# Patient Record
Sex: Male | Born: 1945
Health system: Southern US, Community
[De-identification: ages and names within clinical notes are randomized; demographics above are authoritative.]

## PROBLEM LIST (undated history)

## (undated) DIAGNOSIS — H9191 Unspecified hearing loss, right ear: Secondary | ICD-10-CM

## (undated) DIAGNOSIS — I491 Atrial premature depolarization: Secondary | ICD-10-CM

## (undated) DIAGNOSIS — M1990 Unspecified osteoarthritis, unspecified site: Secondary | ICD-10-CM

## (undated) DIAGNOSIS — G629 Polyneuropathy, unspecified: Secondary | ICD-10-CM

## (undated) DIAGNOSIS — Z7901 Long term (current) use of anticoagulants: Secondary | ICD-10-CM

## (undated) DIAGNOSIS — J45909 Unspecified asthma, uncomplicated: Secondary | ICD-10-CM

## (undated) DIAGNOSIS — N189 Chronic kidney disease, unspecified: Secondary | ICD-10-CM

## (undated) DIAGNOSIS — K279 Peptic ulcer, site unspecified, unspecified as acute or chronic, without hemorrhage or perforation: Secondary | ICD-10-CM

## (undated) DIAGNOSIS — I2510 Atherosclerotic heart disease of native coronary artery without angina pectoris: Secondary | ICD-10-CM

## (undated) DIAGNOSIS — G609 Hereditary and idiopathic neuropathy, unspecified: Secondary | ICD-10-CM

## (undated) DIAGNOSIS — H9190 Unspecified hearing loss, unspecified ear: Secondary | ICD-10-CM

## (undated) DIAGNOSIS — I350 Nonrheumatic aortic (valve) stenosis: Secondary | ICD-10-CM

## (undated) DIAGNOSIS — C4491 Basal cell carcinoma of skin, unspecified: Secondary | ICD-10-CM

## (undated) DIAGNOSIS — J189 Pneumonia, unspecified organism: Secondary | ICD-10-CM

## (undated) DIAGNOSIS — Z974 Presence of external hearing-aid: Secondary | ICD-10-CM

## (undated) DIAGNOSIS — N182 Chronic kidney disease, stage 2 (mild): Secondary | ICD-10-CM

## (undated) DIAGNOSIS — Z85828 Personal history of other malignant neoplasm of skin: Secondary | ICD-10-CM

## (undated) DIAGNOSIS — R600 Localized edema: Secondary | ICD-10-CM

## (undated) DIAGNOSIS — Z860101 Personal history of adenomatous and serrated colon polyps: Secondary | ICD-10-CM

## (undated) DIAGNOSIS — K219 Gastro-esophageal reflux disease without esophagitis: Secondary | ICD-10-CM

## (undated) DIAGNOSIS — K449 Diaphragmatic hernia without obstruction or gangrene: Secondary | ICD-10-CM

## (undated) DIAGNOSIS — G8929 Other chronic pain: Secondary | ICD-10-CM

## (undated) DIAGNOSIS — G250 Essential tremor: Secondary | ICD-10-CM

## (undated) DIAGNOSIS — M545 Low back pain: Secondary | ICD-10-CM

## (undated) DIAGNOSIS — C61 Malignant neoplasm of prostate: Secondary | ICD-10-CM

## (undated) DIAGNOSIS — Z87442 Personal history of urinary calculi: Secondary | ICD-10-CM

## (undated) DIAGNOSIS — M8580 Other specified disorders of bone density and structure, unspecified site: Secondary | ICD-10-CM

## (undated) DIAGNOSIS — I10 Essential (primary) hypertension: Secondary | ICD-10-CM

## (undated) DIAGNOSIS — M1711 Unilateral primary osteoarthritis, right knee: Secondary | ICD-10-CM

## (undated) DIAGNOSIS — H269 Unspecified cataract: Secondary | ICD-10-CM

## (undated) DIAGNOSIS — N200 Calculus of kidney: Secondary | ICD-10-CM

## (undated) DIAGNOSIS — N289 Disorder of kidney and ureter, unspecified: Secondary | ICD-10-CM

## (undated) DIAGNOSIS — R0600 Dyspnea, unspecified: Secondary | ICD-10-CM

## (undated) DIAGNOSIS — D631 Anemia in chronic kidney disease: Secondary | ICD-10-CM

## (undated) DIAGNOSIS — E785 Hyperlipidemia, unspecified: Secondary | ICD-10-CM

## (undated) DIAGNOSIS — Z77098 Contact with and (suspected) exposure to other hazardous, chiefly nonmedicinal, chemicals: Secondary | ICD-10-CM

## (undated) DIAGNOSIS — N529 Male erectile dysfunction, unspecified: Secondary | ICD-10-CM

## (undated) DIAGNOSIS — E7849 Other hyperlipidemia: Secondary | ICD-10-CM

## (undated) DIAGNOSIS — I959 Hypotension, unspecified: Secondary | ICD-10-CM

## (undated) DIAGNOSIS — Z86010 Personal history of colonic polyps: Secondary | ICD-10-CM

## (undated) DIAGNOSIS — M259 Joint disorder, unspecified: Secondary | ICD-10-CM

## (undated) DIAGNOSIS — M549 Dorsalgia, unspecified: Secondary | ICD-10-CM

## (undated) DIAGNOSIS — M79606 Pain in leg, unspecified: Secondary | ICD-10-CM

## (undated) DIAGNOSIS — Z8711 Personal history of peptic ulcer disease: Secondary | ICD-10-CM

## (undated) DIAGNOSIS — R269 Unspecified abnormalities of gait and mobility: Secondary | ICD-10-CM

## (undated) DIAGNOSIS — R32 Unspecified urinary incontinence: Secondary | ICD-10-CM

## (undated) DIAGNOSIS — M459 Ankylosing spondylitis of unspecified sites in spine: Secondary | ICD-10-CM

## (undated) DIAGNOSIS — Z9181 History of falling: Secondary | ICD-10-CM

## (undated) DIAGNOSIS — M810 Age-related osteoporosis without current pathological fracture: Secondary | ICD-10-CM

## (undated) DIAGNOSIS — Z9889 Other specified postprocedural states: Secondary | ICD-10-CM

## (undated) DIAGNOSIS — R319 Hematuria, unspecified: Secondary | ICD-10-CM

## (undated) DIAGNOSIS — C419 Malignant neoplasm of bone and articular cartilage, unspecified: Secondary | ICD-10-CM

## (undated) DIAGNOSIS — N183 Chronic kidney disease, stage 3 unspecified: Secondary | ICD-10-CM

## (undated) DIAGNOSIS — E041 Nontoxic single thyroid nodule: Secondary | ICD-10-CM

## (undated) HISTORY — DX: Ankylosing spondylitis of unspecified sites in spine: M45.9

## (undated) HISTORY — PX: BACK SURGERY: SHX140

## (undated) HISTORY — DX: Malignant neoplasm of prostate: C61

## (undated) HISTORY — DX: Personal history of urinary calculi: Z87.442

## (undated) HISTORY — DX: Basal cell carcinoma of skin, unspecified: C44.91

## (undated) HISTORY — DX: Other hyperlipidemia: E78.49

## (undated) HISTORY — DX: Nonrheumatic aortic (valve) stenosis: I35.0

## (undated) HISTORY — DX: Unilateral primary osteoarthritis, right knee: M17.11

## (undated) HISTORY — DX: Unspecified asthma, uncomplicated: J45.909

## (undated) HISTORY — DX: Nontoxic single thyroid nodule: E04.1

## (undated) HISTORY — DX: Atherosclerotic heart disease of native coronary artery without angina pectoris: I25.10

## (undated) HISTORY — PX: SPINE SURGERY: SHX786

## (undated) HISTORY — DX: Unspecified hearing loss, unspecified ear: H91.90

## (undated) HISTORY — DX: Chronic kidney disease, stage 3 unspecified: N18.30

## (undated) HISTORY — DX: Personal history of peptic ulcer disease: Z87.11

## (undated) HISTORY — DX: Essential (primary) hypertension: I10

## (undated) HISTORY — DX: Hypotension, unspecified: I95.9

## (undated) HISTORY — DX: Malignant neoplasm of bone and articular cartilage, unspecified: C41.9

## (undated) HISTORY — PX: OTHER SURGICAL HISTORY: SHX169

## (undated) HISTORY — DX: Peptic ulcer, site unspecified, unspecified as acute or chronic, without hemorrhage or perforation: K27.9

## (undated) HISTORY — DX: Joint disorder, unspecified: M25.9

## (undated) HISTORY — DX: Polyneuropathy, unspecified: G62.9

## (undated) HISTORY — DX: Essential tremor: G25.0

## (undated) HISTORY — PX: EYE SURGERY: SHX253

## (undated) HISTORY — DX: Other specified disorders of bone density and structure, unspecified site: M85.80

## (undated) HISTORY — DX: Low back pain: M54.5

## (undated) HISTORY — DX: Chronic kidney disease, stage 2 (mild): N18.2

## (undated) HISTORY — PX: VASECTOMY: SHX75

## (undated) HISTORY — DX: Unspecified cataract: H26.9

## (undated) NOTE — Unmapped External Note (Signed)
 Formatting of this note is different from the original. Patient: Bruce Ball  Procedure Summary     Date: 07/01/24 Room / Location: OR 4K6 / [facility] OR   Anesthesia Start: 1248 Anesthesia Stop: 1422   Procedure: ORIF, FRACTURE, FEMUR, DISTAL (Right: Femur) Diagnosis: (R periprosthetic distal femur fx)   Surgeons: Large, Debby Cough, MD Responsible Provider: Debby Raring, MD   Anesthesia Type: general, regional, MAC ASA Status: 3     Anesthesia Type: general, regional, MAC Last vitals BP (!) 158/76 (07/01/24 1615)   Temp 36.6 C (97.9 F) (07/01/24 1600)   Pulse 85 (07/01/24 1615)  Resp 20 (07/01/24 1615)   SpO2 95 % (07/01/24 1615)    SIGN OUT NOTE:  Patient Evaluated:PACU Level of Consciousness:awake, alert and confused (Baseline confused per chart review and nursing, Aox1.)    Pain Management: adequate    PONV Outcome:  No Postop Assessment: no apparent anesthetic complications, tolerate procedure well and no evidence of recall   Cardiovascular:  Hemodynamically stable and blood pressure returned to baseline Respiratory:Respiratory:spontaneous ventilation and nasal cannula      Condition on Discharge/Transfer: stable Discharge/Transfer Disposition: med/surg floor     Patient able to participate in sign-out: Does NOT require additional follow up  There were no known notable events for this encounter.   Electronically signed by Luke Rosina Pintos, MD at 07/01/2024  4:25 PM EDT

## (undated) NOTE — Progress Notes (Signed)
 Formatting of this note might be different from the original.  Care Management Progress Note  1. Recommendation: SAR  2. Interventions: MSW faxed updated clinicals to [facility], [facility], and [facility] for SAR placement. MSW called [facility] [phone] to speak with admissions. No answer, MSW left a VM.   MSW called [facility] [phone] with [facility] and no answer, MSW left a VM.   MSW called Niki [phone] with [facility] and Niki stated she would review the clinicals and follow up.  MSW met with pt and pt's wife at bedside. Pt's wife called her insurance about non-emergency transport for the pt once he has an accepting facility. If the chosen facility is in network no prior authorization will be needed.   If the facility is not in network then prior authorization will be needed and [employer] will need the following:  - NPI  - Name of Ambulance Service  - MD Name, address, and number - Date of transfer service  - Pt clinicals   These items will need to be faxed to [phone]  3. Barriers: Other: Pending an accepting facility   4. Outcomes: Other: ongoing dc planning   5. Next Steps: Follow up with [facility] about auth initiation and reach out to transportation regarding out-of-state transport.  6. Expected Discharge Date: 7/14  Update 12:38pm - MSW received a call from [facility] and she stated that they can accept the pt and that she is leaving for the day but will start insurance auth in the morning.  MSW called [facility] [phone], Option 5 to inquire about out-of-state non-emergency transport. [employer] stated they will need information for outsourced transportation company and will initiate authorization that can take up to 14 days.   MSW called pt's wife to provide an update about SAR placement and to inform her that it is likely she will need to pay upfront for transport. Pt's wife verbalized understanding and asked MSW to follow up with a quote for  transport.   Luise Creed, MSW  [phone] Electronically signed by Creed Luise, MSW at 07/07/2024 11:39 AM EDT Electronically signed by Creed Luise, MSW at 07/07/2024 12:46 PM EDT Electronically signed by Creed Luise, MSW at 07/08/2024  7:43 AM EDT Electronically signed by Creed Luise, MSW at 07/08/2024 11:33 AM EDT

## (undated) NOTE — Nursing Note (Signed)
 Formatting of this note might be different from the original. Resumed care of pt from night RN Bonney). Pt was A&O x 4 in position of comfort, & no distress was indicated. Bed locked in lowest position & call button within reach. Bed alarm on zone 2 & SCDs on L leg. Brace on R leg, so unable to put SCD on that on.  Electronically signed by Prentiss Krabbe, RN at 07/02/2024 12:40 PM EDT

## (undated) NOTE — Progress Notes (Signed)
 Formatting of this note is different from the original. Brief Ortho Progress Note  - Met with patient and spouse to update on no OR today and plan for OR tentatively 7/3.    - Diet & NPO @ [place] ordered  Hosey Arrow, APRN, AGACNP-BC  [facility]    PRIMARY Ortho Trauma or Joints - PIC [phone]   ALL NEW CONSULTS - PIC [phone]  New Orthopaedic consult patients or newly found injuries  CURRENT/EXISTING PATIENTS Ortho TRAUMA CONSULTS - PIC [phone]  Ortho SPINE - PIC [phone]  Ortho HAND - PIC [id number]  Podiatry - PIC [phone] Electronically signed by Arrow Hosey Borer, NP at 06/30/2024  3:56 PM EDT

## (undated) NOTE — Unmapped External Note (Signed)
 Formatting of this note might be different from the original.  Problem: Increased nutrient needs (NI-5.1) Goal: Food and/or Nutrient Delivery Description: Individualized approach for food/nutrient provision. Outcome: Progressing Toward Goal  Electronically signed by Gennie Lam, DIETETIC TECH at 07/07/2024 11:52 AM EDT

## (undated) NOTE — Nursing Note (Signed)
 Formatting of this note might be different from the original. Patient is A&Ox4. Patient spouse present at bedside at this time. Patient appeared to be in no signs of distress. Patient reports a 9/10 pain. AIDET performed. Board updated. Patient bed is in the lowest position with bed alarm turned on. SCD are in use. Plan of care on-going Electronically signed by [name] Rife, RN at 07/03/2024  8:04 PM EDT

## (undated) NOTE — Progress Notes (Signed)
 Formatting of this note might be different from the original. Occupational Therapy Contact Note  Consult appreciated and chart reviewed. Per chart, patient pending OR with ortho today for femur fracture. Will continue to monitor chart and re-attempt when the patient is available/appropriate.   Thank you, [place] Bennie, OTD, OTR/L Occupational Therapist  Electronically signed by Bennie, [place], OT at 07/01/2024  7:45 AM EDT

## (undated) NOTE — Nursing Note (Signed)
 Formatting of this note might be different from the original. Patient is A&Ox4. Patient appeared to be in no signs of distress. Patient has no complaints of pain. AIDET performed. Board updated. Patient bed is in the lowest position with bed alarm turned on. SCD are in use. Plan of care on-going Electronically signed by [name] Rife, RN at 07/09/2024  2:24 AM EDT

## (undated) NOTE — Progress Notes (Signed)
 Formatting of this note is different from the original. Orthopaedic Progress Note  Patient Info: Name: Bruce Ball  Admission Date: 06/27/2024  3:16 PM  MRN: [id number] Hospital Day: Day 39 of ? (NO DRG)   Room/Bed: 5B32/01 DOB: 02-09-1946   Age: 26 y.o.    Interval Update / Subjective  - No acute events overnight.  - No new numbness, tingling, weakness.  - Pain controlled  - Denies Nausea, Vomiting, Diarrhea, Chest pain, and SOB - Dispo pending SAR placement with external facility in [place]. Wife at [facility] and agrees on dispo recommendation.  Objective   Temp:  [36.6 C (97.8 F)-36.9 C (98.4 F)] 36.8 C (98.2 F) Heart Rate:  [65-81] 66 Respirations:  [17-18] 18 Blood Pressure: (127-153)/(62-75) 143/70  Labs: Recent Labs  Lab 07/05/24 2345 07/08/24 1100  NA 141 141  K 3.9 4.6  CL 105 105  CO2 27 28  BUN 29* 32*  CREATININE 1.1 1.2  GLU 99 125   Recent Labs  Lab 07/05/24 2345 07/08/24 1100  WBC 8.4 7.2  HGB 12.5* 12.8*  PLT 215 290   No results for input(s): INR in the last 168 hours.  I/O I/O last 3 completed shifts: In: 296 [P.O.:296] Out: 1550 [Urine:1550] No intake/output data recorded.  Gen: NAD, AOx3 Pulm: Non-labored breathing Cards: Regular rate and rhythm   RLE: Dressings c/d/I, HKB @ 20 degrees SILT sp/dp/t/sa/su Motor +ehl/fhl/ta/gsc Compartments soft, compressible Toes wwp, BCR x5 2+ DP pulse  Assessment   Bruce Ball is a 53 y.o. male s/p GLF.  Injury/Diagnosis Complex  - R periprosthetic MFC fx  Surgeries - R femur ORIF on 07/01/24 with Dr. Jackquelyn Salines Problem List Active Hospital Problems   Diagnosis Date Noted   DVT (deep venous thrombosis) (CMS-HCC) 06/28/2024   HTN (hypertension), benign 06/28/2024   Acid reflux 06/28/2024   CHF (congestive heart failure) (CMS-HCC) 06/28/2024   Renal disease 06/28/2024   Closed displaced fracture of condyle of right femur (CMS-HCC) 06/27/2024   Trauma 06/27/2024   Plan  General   -IS - Monitor & replete lytes PRN - BR  Acute Pain - Multimodal analgesia  - Adjust PRN  RLE - Splints/Brace/Ex-fix: HKB locked at 20  - Drains/wound vacs: None - Surgery(s) complete - Follow-up: 7/22 with TL or at home in [facility] - PT/OT  Current Activity Order (From admission, onward)         Ordered    Activity - Weight Bearing and Restrictions  UNTIL DISCONTINUED       Question Answer Comment  Activity Restrictions Weight Bearing   Activity Restrictions Brace Wear Precautions   Weight bearing status RUE Weight bearing as tolerated   Weight bearing status LUE Weight bearing as tolerated   Weight bearing status RLE Non-weight bearing   Weight bearing status LLE Weight bearing as tolerated   Bracing Wear RLE   Patient to wear RLE brace At all times (specify when to remove) HKB locked @ 20  When to remove RLE brace N/A     07/07/24 1045        PPX - Antibiotics: 24 Hr Periop Ancef  - IP: home Eliquis   - at Discharge: home Eliquis  dose (2.5mg )   Remainder of care per Co-Management Team W  This patient is on [facility] Orthopedic Surgery Internal Medicine Co-Management Team W.  Orthopedic Surgery can be reached at pic [phone] for any questions regarding pain control or the injured/operative extremity. [employer] W can be reached at [employer]  [phone] for any questions regarding vital sign abnormalities, blood sugar, urinary retention, constipation, or any other medical concerns. In the event of a patient emergency, please page [facility] Team W at pic [phone].  Discharge Planning - Medically cleared for DC - Barrier: SAR - DC Location: SAR in [place] - PT/OT: Clearance not needed   Margarie Bras, MD  [facility]    PRIMARY Ortho Trauma or Joints - PIC [phone]   ALL NEW CONSULTS - PIC [phone]  New Orthopaedic consult patients or newly found injuries  CURRENT/EXISTING PATIENTS   Ortho TRAUMA CONSULTS - PIC [phone]    Ortho SPINE - PIC [phone]    Ortho HAND - PIC [phone]     Podiatry - PIC [phone]    Cosigned by Jackquelyn Debby Cough, MD at 07/12/2024  9:11 AM EDT Electronically signed by Bras Margarie Bent, MD at 07/10/2024 11:33 AM EDT Electronically signed by Jackquelyn Debby Cough, MD at 07/12/2024  9:11 AM EDT  Associated attestation - Large, Debby Cough, MD - 07/12/2024  9:11 AM EDT Formatting of this note might be different from the original. I am administratively signing this document.  Debby Cough Jackquelyn, MD

## (undated) NOTE — Progress Notes (Signed)
 Formatting of this note is different from the original. Orthopaedic Progress Note  Patient Info: Name: Bruce Ball  Admission Date: 06/27/2024  3:16 PM  MRN: [id number] Hospital Day: Day 7 of ? (NO DRG)   Room/Bed: 5B32/01 DOB: 1946-06-02   Age: 60 y.o.    Interval Update / Subjective  POD3 s/p R femur ORIF on 07/01/24 w Dr. Jackquelyn  - No acute ortho events overnight.  - Resting comfortably in bed - Pain controlled - Denies Nausea, Vomiting, Diarrhea, Chest Pain, and SOB  -requires ongoing PT/OT care  Objective   Temp:  [36.4 C (97.5 F)-37.4 C (99.3 F)] 37.1 C (98.7 F) Heart Rate:  [67-78] 72 Respirations:  [15-18] 18 Blood Pressure: (127-171)/(54-74) 155/63  Labs: Recent Labs  Lab 07/02/24 0122 07/03/24 0627  NA 138 141  K 4.1 3.8  CL 103 105  CO2 25 28  BUN 22 26*  CREATININE 1.1 1.2  GLU 165* 102   Recent Labs  Lab 07/02/24 0122 07/03/24 0627  WBC 6.7 9.6  HGB 13.9 13.4  PLT 202 199   Recent Labs  Lab 06/27/24 1538 06/29/24 2321  INR 1.4* 1.1*   I/O I/O last 3 completed shifts: In: 1000 [P.O.:1000] Out: 870 [Urine:870] I/O this shift: In: -  Out: 690 [Urine:690]  Gen: NAD, AOx3 Pulm: Non-labored breathing Cards: Regular rate and rhythm   RLE: Dressings c/d/I, knee immobilizer in place SILT sp/dp/t/sa/su Motor +ehl/fhl/ta/gsc Compartments soft, compressible Toes wwp, BCR x5 2+ DP pulse  Assessment   Bruce Ball is a 52 y.o. male s/p GLF.  Injury/Diagnosis Complex  - R periprosthetic MFC fx  Surgeries - R femur ORIF on 07/01/24 with Dr. Jackquelyn Salines Problem List Active Hospital Problems   Diagnosis Date Noted   DVT (deep venous thrombosis) (CMS-HCC) 06/28/2024   HTN (hypertension), benign 06/28/2024   Acid reflux 06/28/2024   CHF (congestive heart failure) (CMS-HCC) 06/28/2024   Renal disease 06/28/2024   Closed displaced fracture of condyle of right femur (CMS-HCC) 06/27/2024   Trauma 06/27/2024   Plan  General   -IS - Monitor & replete lytes PRN - BR  Acute Pain - Multimodal analgesia  - Adjust PRN  RLE - Splints/Brace/Ex-fix: HKB locked at 20  - Drains/wound vacs: None - Surgery(s) complete - Follow-up: 2-3 weeks - PT/OT  Current Activity Order (From admission, onward)         Ordered    Activity - Weight Bearing and Restrictions  UNTIL DISCONTINUED       Question Answer Comment  Activity Restrictions Weight Bearing   Activity Restrictions Brace Wear Precautions   Weight bearing status RUE Weight bearing as tolerated   Weight bearing status LUE Weight bearing as tolerated   Weight bearing status RLE Non-weight bearing   Weight bearing status LLE Weight bearing as tolerated   Bracing Wear RLE   Patient to wear RLE brace At all times (specify when to remove)   When to remove RLE brace N/A     07/01/24 1411        PPX - Antibiotics: 24 Hr Periop Ancef  - IP: Lovenox  - at Discharge: Complete initial 4 weeks ASA 81mg  BID  Discharge Planning - Estimated DC Date: TBD - Barrier: PT/OT - DC Location: TBD - PT/OT: Pending Eval  Lesley Spurr, MD  [facility]    PRIMARY Ortho Trauma or Joints - PIC [id number]   ALL NEW CONSULTS - PIC [phone]  New Orthopaedic consult patients or newly found  injuries  CURRENT/EXISTING PATIENTS Ortho TRAUMA CONSULTS - PIC [phone]  Ortho SPINE - PIC [phone]  Ortho HAND - PIC [id number]  Podiatry - PIC [phone]  Electronically signed by Rebecka Lesley Leyden, MD at 07/04/2024  6:37 AM EDT

## (undated) NOTE — Unmapped External Note (Signed)
 Formatting of this note might be different from the original. Wound Care  Patient seen for skin tear follow up. Patient wound with scab, left intact d/t blood thinners. Photo in chart media. No skilled wound care needs at his time, wound care to sign off. Please re-consult if patient need for skilled services arises.    Alan Saba, BSN, RN, [facility] Wound Care Department   Electronically signed by Saba Alan, RN at 07/06/2024 12:41 PM EDT Electronically signed by Saba Alan, RN at 07/06/2024  2:36 PM EDT

## (undated) NOTE — Progress Notes (Signed)
 Formatting of this note might be different from the original. Patient received A&Ox4, in NAD. Saturating well on room air. AIDET. Boards updated. All safety measures in place. Wife bedside. Bed alarm on, SCD in place. Call light within reach. Plan of care ongoing.   Electronically signed by Troup, Anyssa, RN at 07/01/2024  8:28 AM EDT

## (undated) NOTE — Progress Notes (Signed)
 Formatting of this note might be different from the original.  Care Management Progress Note  1. Recommendation: SAR  2. Interventions: MSW sent updated PT/OT notes to Niki [phone] with [facility] to send to insurance for auth.   Britney confirmed notes were sent. Shara is anticipated to come back by Monday. Britney stated DC documents/orders will need to be in by 12 pm and the facility cutoff time is 8pm.   Transportation has been arranged for Tuesday (7/15) at 5 am. Transportation has been outsourced and the company is [employer] [phone]. Pt's wife has already paid for transport.   MSW called pt's insurance [employer] and spoke to [place] T. [phone], Option 5 to start the prior authorization for transportation billing. MSW provided [place] with the transportation details and NPI numbers. [facility] submitted the prior-auth request and stated that the clinicals and dc documents will need to be faxed to [phone].  3. Barriers: Auth pending  4. Outcomes: Transportation arranged  5. Next Steps: Follow up on auth and fax   6. Expected Discharge Date: 7/15  5:00 pm - Britney with [facility] called MSW and pt's auth came back. The auth expires Tuesday 7/15.    Luise Creed, MSW  [phone] Electronically signed by Creed Luise, MSW at 07/09/2024  1:17 PM EDT Electronically signed by Creed Luise, MSW at 07/09/2024  5:00 PM EDT

## (undated) NOTE — Nursing Note (Signed)
 Formatting of this note might be different from the original. IV removed and dressing applied. All patient belongings given to transportation at this time. Discharge paper and medication given transportation at this time. Electronically signed by [name] Rife, RN at 07/13/2024  5:34 AM EDT

## (undated) NOTE — Progress Notes (Signed)
 Formatting of this note is different from the original. Orthopaedic Progress Note  Patient Info: Name: Bruce Ball  Admission Date: 06/27/2024  3:16 PM  MRN: [id number] Hospital Day: Day 15 of ? (NO DRG)   Room/Bed: 5B32/01 DOB: February 20, 1946   Age: 35 y.o.    Interval Update / Subjective  - No acute events overnight.  - No new numbness, tingling, weakness.  - Pain controlled  - Denies Nausea, Vomiting, Diarrhea, Chest pain, and SOB - Dispo pending SAR - anticipate DC 7/15 to [facility] . DC summary placed in advance per request of facility   Objective   Temp:  [36.6 C (97.8 F)-37.1 C (98.8 F)] 36.6 C (97.8 F) Heart Rate:  [59-78] 66 Respirations:  [16-19] 17 Blood Pressure: (141-146)/(64-76) 146/67  Labs: Recent Labs  Lab 07/08/24 1100 07/12/24 0056  NA 141 139  K 4.6 4.0  CL 105 107  CO2 28 26  BUN 32* 35*  CREATININE 1.2 1.2  GLU 125 105   Recent Labs  Lab 07/08/24 1100 07/12/24 0056  WBC 7.2 8.5  HGB 12.8* 13.0*  PLT 290 357   No results for input(s): INR in the last 168 hours.  I/O I/O last 3 completed shifts: In: 600 [P.O.:600] Out: 700 [Urine:700] No intake/output data recorded.  Gen: NAD, AOx3 Pulm: Non-labored breathing Cards: Regular rate and rhythm   RLE: Dressings c/d/I, HKB @ 20 degrees SILT sp/dp/t/sa/su Motor +ehl/fhl/ta/gsc Compartments soft, compressible Toes wwp, BCR x5 2+ DP pulse  Assessment   Bruce Ball is a 73 y.o. male s/p GLF.  Injury/Diagnosis Complex  - R periprosthetic MFC fx  Surgeries - R femur ORIF on 07/01/24 with Dr. Jackquelyn Salines Problem List Active Hospital Problems   Diagnosis Date Noted   DVT (deep venous thrombosis) (CMS-HCC) 06/28/2024   HTN (hypertension), benign 06/28/2024   Acid reflux 06/28/2024   CHF (congestive heart failure) (CMS-HCC) 06/28/2024   Renal disease 06/28/2024   Closed displaced fracture of condyle of right femur (CMS-HCC) 06/27/2024   Trauma 06/27/2024   Plan   General  -IS - Monitor & replete lytes PRN - BR  Acute Pain - Multimodal analgesia  - Adjust PRN  RLE - Splints/Brace/Ex-fix: HKB locked at 20  - Drains/wound vacs: None - Surgery(s) complete - Follow-up: 7/22 with TL or at [facility] in [facility] - PT/OT  Current Activity Order (From admission, onward)         Ordered    Activity - Weight Bearing and Restrictions  UNTIL DISCONTINUED       Question Answer Comment  Activity Restrictions Weight Bearing   Activity Restrictions Brace Wear Precautions   Weight bearing status RUE Weight bearing as tolerated   Weight bearing status LUE Weight bearing as tolerated   Weight bearing status RLE Non-weight bearing   Weight bearing status LLE Weight bearing as tolerated   Bracing Wear RLE   Patient to wear RLE brace At all times (specify when to remove) HKB locked @ 20  When to remove RLE brace N/A     07/07/24 1045        PPX - Antibiotics: 24 Hr Periop Ancef  - IP: home Eliquis   - at Discharge: home Eliquis  dose (2.5mg )   Remainder of care per Co-Management Team W  This patient is on [facility] Orthopedic Surgery Internal Medicine Co-Management Team W.  Orthopedic Surgery can be reached at pic [phone] for any questions regarding pain control or the injured/operative extremity. [employer] W can be  reached at pic [phone] for any questions regarding vital sign abnormalities, blood sugar, urinary retention, constipation, or any other medical concerns. In the event of a patient emergency, please page [facility] Team W at pic [phone].  Discharge Planning - Medically cleared for DC - Barrier: SAR - DC Location: SAR in [place] - PT/OT: Clearance not needed   Zetta Sherle RIGGERS, MPH  [facility]    PRIMARY Ortho Trauma or Joints - PIC [phone]   ALL NEW CONSULTS - PIC [phone]  New Orthopaedic consult patients or newly found injuries  CURRENT/EXISTING PATIENTS   Ortho TRAUMA CONSULTS - PIC [phone]    Ortho SPINE - PIC [phone]    Ortho HAND - PIC  [id number]    Podiatry - PIC [phone]    Electronically signed by Sherle Zetta, PA-C at 07/12/2024 11:09 AM EDT

## (undated) NOTE — Progress Notes (Signed)
 Formatting of this note is different from the original. Orthopaedic Progress Note  Patient Info: Name: Bruce Ball  Admission Date: 06/27/2024  3:16 PM  MRN: [id number] Hospital Day: Day 82 of ? (NO DRG)   Room/Bed: 5B32/01 DOB: 04-14-1946   Age: 62 y.o.    Interval Update / Subjective  - No acute events overnight.  - No new numbness, tingling, weakness.  - Pain controlled  - Denies Nausea, Vomiting, Diarrhea, Chest pain, and SOB - Dispo pending SAR placement with external facility in [place]. Wife at [facility] and agrees on dispo recommendation.  Objective   Temp:  [36.6 C (97.9 F)-37.2 C (98.9 F)] 36.6 C (97.9 F) Heart Rate:  [68-80] 78 Respirations:  [18] 18 Blood Pressure: (116-139)/(53-76) 116/58  Labs: Recent Labs  Lab 07/03/24 0627 07/05/24 2345  NA 141 141  K 3.8 3.9  CL 105 105  CO2 28 27  BUN 26* 29*  CREATININE 1.2 1.1  GLU 102 99   Recent Labs  Lab 07/05/24 2345 07/08/24 1100  WBC 8.4 7.2  HGB 12.5* 12.8*  PLT 215 290   No results for input(s): INR in the last 168 hours.  I/O I/O last 3 completed shifts: In: 654 [P.O.:654] Out: 1300 [Urine:1300] No intake/output data recorded.  Gen: NAD, AOx3 Pulm: Non-labored breathing Cards: Regular rate and rhythm   RLE: Dressings c/d/I, HKB @ 20 degrees SILT sp/dp/t/sa/su Motor +ehl/fhl/ta/gsc Compartments soft, compressible Toes wwp, BCR x5 2+ DP pulse  Assessment   Bruce Ball is a 57 y.o. male s/p GLF.  Injury/Diagnosis Complex  - R periprosthetic MFC fx  Surgeries - R femur ORIF on 07/01/24 with Dr. Jackquelyn Salines Problem List Active Hospital Problems   Diagnosis Date Noted   DVT (deep venous thrombosis) (CMS-HCC) 06/28/2024   HTN (hypertension), benign 06/28/2024   Acid reflux 06/28/2024   CHF (congestive heart failure) (CMS-HCC) 06/28/2024   Renal disease 06/28/2024   Closed displaced fracture of condyle of right femur (CMS-HCC) 06/27/2024   Trauma 06/27/2024   Plan  General   -IS - Monitor & replete lytes PRN - BR  Acute Pain - Multimodal analgesia  - Adjust PRN  RLE - Splints/Brace/Ex-fix: HKB locked at 20  - Drains/wound vacs: None - Surgery(s) complete - Follow-up: 7/22 with TL or at home in [facility] - PT/OT  Current Activity Order (From admission, onward)         Ordered    Activity - Weight Bearing and Restrictions  UNTIL DISCONTINUED       Question Answer Comment  Activity Restrictions Weight Bearing   Activity Restrictions Brace Wear Precautions   Weight bearing status RUE Weight bearing as tolerated   Weight bearing status LUE Weight bearing as tolerated   Weight bearing status RLE Non-weight bearing   Weight bearing status LLE Weight bearing as tolerated   Bracing Wear RLE   Patient to wear RLE brace At all times (specify when to remove) HKB locked @ 20  When to remove RLE brace N/A     07/07/24 1045        PPX - Antibiotics: 24 Hr Periop Ancef  - IP: home Eliquis   - at Discharge: home Eliquis  dose (2.5mg )   Remainder of care per Co-Management Team W  This patient is on [facility] Internal Medicine Co-Management Team W.  Orthopedic Surgery can be reached at pic [phone] for any questions regarding pain control or the injured/operative extremity. [employer] [facility] can be reached at [employer]  [phone] for any questions regarding vital sign abnormalities, blood sugar, urinary retention, constipation, or any other medical concerns. In the event of a patient emergency, please page [facility] Team W at pic [phone].  Discharge Planning - Medically cleared for DC - Barrier: SAR - DC Location: SAR in [place] - PT/OT: Clearance not needed   Zetta Sherle RIGGERS, MPH  [facility]    PRIMARY Ortho Trauma or Joints - PIC [phone]   ALL NEW CONSULTS - PIC [phone]  New Orthopaedic consult patients or newly found injuries  CURRENT/EXISTING PATIENTS   Ortho TRAUMA CONSULTS - PIC [phone]    Ortho SPINE - PIC [phone]    Ortho HAND - PIC [id number]     Podiatry - PIC [phone]    Electronically signed by Sherle Zetta, PA-C at 07/08/2024 12:00 PM EDT

## (undated) NOTE — Unmapped External Note (Signed)
 Formatting of this note might be different from the original.  Problem: Anxiety Goal: The patient's care is consistent with the individualized perioperative plan of care. Outcome: New Goal Established Goal: The patient's right to privacy is maintained. Outcome: New Goal Established Goal: Patient and family demonstrates knowledge of expected response to operative or invasive procedures Outcome: New Goal Established  Problem: Acute pain Goal: The patient demonstrates knowledge of pain management. Outcome: New Goal Established  Problem: Risk for falls Goal: Patient is free from signs and symptoms of injuries Outcome: New Goal Established  Problem: Nausea Goal: The patient receives prescribed medications and solutions, safely administered during the post-op period Outcome: New Goal Established  Electronically signed by Artis Kinder, RN at 07/01/2024 11:29 AM EDT

## (undated) NOTE — Unmapped External Note (Signed)
 Formatting of this note is different from the original.  Problem: DVT (Risk For) Goal: Demonstrates adequate circulation status as evidenced by the following indicators: Peripheral pulses strong/Peripheral pulses symmetrical/ Peripheral edema not present Outcome: Continue with Current Goal  Problem: Knowledge Deficit Goal: Patient will verbalize and/or demonstrate understanding of teaching by discharge Outcome: Continue with Current Goal Goal: Patient will Participate in learning opportunities offered by staff Outcome: Continue with Current Goal  Problem: Infection/Isolation (Risk/Actual) Goal: Patient will be free of hospital acquired infection throughout hospitalization Outcome: Continue with Current Goal  Problem: Pain/Comfort Goal: Adequate pain control while hospitalized Outcome: Continue with Current Goal  Problem: Fall Risk Goal: Patient will remain as independent as possible Outcome: Continue with Current Goal Goal: Patient will have lower fall risk. Lower injury risk. Outcome: Continue with Current Goal Goal: Patient will remain safe from falls and injury Outcome: Continue with Current Goal Goal: Patient/family will understand fall prevention measures Outcome: Continue with Current Goal Goal: Patient/family will understand injury reduction measures Outcome: Continue with Current Goal Goal: Patient/family will comply with fall program Outcome: Continue with Current Goal Goal: Patient/family will verbalize fall prevention strategies to implement after discharge Outcome: Continue with Current Goal  Problem: Skin Alteration (Risk for/Actual) Goal: Patient?s skin integrity will remain intact Outcome: Continue with Current Goal Goal: Patient will evidence no skin breakdown Outcome: Continue with Current Goal Goal: Patient will regain integrity of skin tissue Outcome: Continue with Current Goal  Problem: Anxiety Goal: The patient's care is consistent with the  individualized perioperative plan of care. Outcome: Continue with Current Goal Goal: The patient's right to privacy is maintained. Outcome: Continue with Current Goal Goal: Patient and family demonstrates knowledge of expected response to operative or invasive procedures Outcome: Continue with Current Goal  Problem: Anxiety Goal: The patient's care is consistent with the individualized perioperative plan of care. Outcome: Continue with Current Goal Goal: The patient's right to privacy is maintained. Outcome: Continue with Current Goal Goal: Patient and family demonstrates knowledge of expected response to operative or invasive procedures Outcome: Continue with Current Goal  Problem: Acute pain Goal: The patient demonstrates knowledge of pain management. Outcome: Continue with Current Goal  Problem: Risk for falls Goal: Patient is free from signs and symptoms of injuries Outcome: Continue with Current Goal  Problem: Nausea Goal: The patient receives prescribed medications and solutions, safely administered during the post-op period Outcome: Continue with Current Goal  Problem: Post-Operative Respiratory Complications Goal: Patient Will Remain Free Of Post-Operative Complications Outcome: Continue with Current Goal Goal: Effective Utilization Of Incentive Spirometry Description: INTERVENTIONS: 1. 10 times every hour while awake   2. Document volume every 4 hours Outcome: Continue with Current Goal Goal: Effective Pulmonary Toileting Description: INTERVENTIONS: 1. Cough/deep breath every 2 hours while awake Outcome: Continue with Current Goal Goal: Appropriate Oral Care Maintenance Description: INTERVENTIONS:  1. Oral care every 12 hours Outcome: Continue with Current Goal Goal: Adequate Patient/Family Education Description: INTERVENTIONS: 1. Patient/family education provided  Outcome: Continue with Current Goal Goal: Implementation Of Appropriate Early Mobility  Interventions Description: INTERVENTIONS: 1.  (Acute Care/Med-Surg) Get-Up-And-Go assessment completed, and interventions initiated 2.  (Critical/Intermediate Care) M.O.V.E.S. assessment completed, and interventions initiated Outcome: Continue with Current Goal Goal: Maintenance of HOB Elevation Description: INTERVENTIONS: 1. HOB 30 degrees Outcome: Continue with Current Goal  Problem: Anxiety Goal: The patient's care is consistent with the individualized perioperative plan of care. Outcome: Continue with Current Goal Goal: The patient's right to privacy is maintained. Outcome: Continue with Current Goal  Goal: Patient and family demonstrates knowledge of expected response to operative or invasive procedures Outcome: Continue with Current Goal  Problem: Acute pain Goal: The patient demonstrates knowledge of pain management. Outcome: Continue with Current Goal  Problem: Risk for falls Goal: Patient is free from signs and symptoms of injuries Outcome: Continue with Current Goal  Problem: Nausea Goal: The patient receives prescribed medications and solutions, safely administered during the post-op period Outcome: Continue with Current Goal  Problem: Decreased Gait Goal: Ambulate with least restrictive assistive device Outcome: Continue with Current Goal Goal: Improve balance Outcome: Continue with Current Goal  Electronically signed by [name] Rife, RN at 07/03/2024  8:03 PM EDT

## (undated) NOTE — Nursing Note (Signed)
 Formatting of this note might be different from the original. Patient received awake alert and oriented. No signs of distress noted. Immobilizer intact to the RLE. Skin tear and bruising to the R hand, R forehead above the R eye. Pictures in the media. Spouse at bedside. Safety measures in place. Call light within reach. Will continue to monitor. Electronically signed by Geraline Senters, RN at 06/28/2024 12:49 AM EDT

## (undated) NOTE — Unmapped External Note (Signed)
 Formatting of this note might be different from the original.  Problem: DVT (Risk For) Goal: Demonstrates adequate circulation status as evidenced by the following indicators: Peripheral pulses strong/Peripheral pulses symmetrical/ Peripheral edema not present Outcome: Continue with Current Goal  Problem: Knowledge Deficit Goal: Patient will Participate in learning opportunities offered by staff Outcome: Continue with Current Goal  Problem: Pain/Comfort Goal: Adequate pain control while hospitalized Outcome: Continue with Current Goal  Problem: Fall Risk Goal: Patient will have lower fall risk. Lower injury risk. Outcome: Continue with Current Goal  Electronically signed by Levan Rondo, RN at 07/10/2024  8:49 AM EDT

## (undated) NOTE — Nursing Note (Signed)
 Formatting of this note might be different from the original. Patient is A&Ox4. Patient appeared to be in no signs of distress. Patient has no complaints of pain. AIDET performed. Board updated. Patient bed is in the lowest position with bed alarm turned on. SCD are in use. Plan of care on-going Electronically signed by [name] Rife, RN at 07/07/2024  7:48 PM EDT

## (undated) NOTE — Unmapped External Note (Signed)
 Formatting of this note is different from the original.                                      [facility] Survey Template/Daily Progress Note    The patient is 75 y.o. male who was admitted on 06/27/2024 after a Fall.  Acute Care Surgery Treatment Team Assigned: [facility] ACS Trauma 1  Hospital course/Interventions: Bruce Ball is a 89M with PMHx PE (12/2019), CKD3a, Hx of prostrate cancer (01/ 2003, Gleason 3+3, 03-10-2002 s/p radical prostatectomy pelvic lymph node disections, no recurrence), lumbosacral radiculopathy, CAD (CAC on CT), PVCs on Coreg , HTN, GERD, vitamin D  deficiency, essential tremor, Hx Sarcoma (~1970, s/p revision right proximal femur replacement in setting of distal periprosthetic femur fx both components 09/2021), osteoporosis (Tx IV reclast yearly), HLD with Frailty score of 0.17 [place] right periprosthetic distal femoral condyle fracture.  In room patient had sustained episode of right knee pain, while knee was in immobilizer. Per patient and his wife, he has chronic decreased sensation in his feet numbness. No current chest pain, dyspnea, dizziness, lightheadedness, presyncope, or vision impairment. He is hard of hearing normally.  PMH: PE (12/2019), CKD3a, Hx of prostrate cancer (01/ 2003, Gleason 3+3, 03-10-2002 s/p radical prostatectomy pelvic lymph node disections, no recurrence), lumbosacral radiculopathy, CAD (CAC on CT), PVCs on Coreg , HTN, GERD, vitamin D  deficiency, essential tremor, Hx Sarcoma (~1970, s/p revision right proximal femur replacement in setting of distal periprosthetic femur fx both components 09/2021), osteoporosis (Tx IV reclast yearly), HLD  PSH: As above  Social Hx: Social History   Socioeconomic History   Marital status: Married    Spouse name: None   Number of children: None   Years of education: None   Highest education level: None  Occupational History   None  Tobacco Use   Smoking status: None   Smokeless  tobacco: None  Substance and Sexual Activity   Alcohol use: None   Drug use: None   Sexual activity: None  Other Topics Concern   None  Social History Narrative   None   Social Drivers of Health   Financial Resource Strain: Patient Unable To Answer (06/28/2024)   Overall Financial Resource Strain (CARDIA)    Difficulty of Paying Living Expenses: Patient unable to answer  Food Insecurity: Patient Unable To Answer (06/28/2024)   Hunger Vital Sign    Worried About Programme researcher, broadcasting/film/video in the Last Year: Patient unable to answer    Ran Out of Food in the Last Year: Patient unable to answer  Transportation Needs: Patient Unable To Answer (06/28/2024)   PRAPARE - Transportation    Lack of Transportation (Medical): Patient unable to answer    Lack of Transportation (Non-Medical): Patient unable to answer  Housing Stability: Unknown (06/28/2024)   Housing Stability Vital Sign    Unable to Pay for Housing in the Last Year: Patient unable to answer    Number of Times Moved in the Last Year: 0    Homeless in the Last Year: Patient unable to answer   Frailty Risk   Low: 0.0  Moderate: 0.33 High: greater or equal to 0.5  None = 0 None = 0 Hypertension requiring medication = 1  None = 0  Independent = 0  Less than 3 = 1  1/6=0.16  NTDS Comorbid Conditions:Hypertension (requiring medical therapy)  Family Hx: No family  history on file.  Home Meds: Medications Prior to Admission  Medication Sig Dispense Refill Last Dose/Taking   AmLODIPine  (NORVASC ) 10 mg tablet Take 10 mg by mouth every day.   Unknown   apixaban  (ELIQUIS ) 2.5 MG tablet Take 2.5 mg by mouth 2 times every day.   Unknown   atorvastatin  (LIPITOR) 20 MG tablet Take 20 mg by mouth every night. FOR CHOLESTEROL   Unknown   carvedilol  (COREG ) 25 mg tablet Take 25 mg by mouth every 12 hours.   Unknown   cetirizine (ZYRTEC) 10 mg tablet Take 10 mg by mouth use as needed for Allergies or Rhinitis.   Unknown   Cholecalciferol   (VITAMIN D3) 50 MCG (2000 UT) CAPS Take 2,000 Units by mouth every day.   Unknown   furosemide  (LASIX ) 20 mg tablet Take 20 mg by mouth use as needed (Take 1 tablet (20 mg total) by mouth daily as needed for fluid (if the feet and/or legs swell or weight increased 3 lbs in a day or 5 lbs in 3 days).).   Unknown   HYDROmorphone  (DILAUDID ) 4 mg tablet Take 2 mg by mouth every 12 hours as needed for Pain.   Unknown   methocarbamol  (ROBAXIN ) 500 mg tablet Take 500 mg by mouth 4 times every day as needed (muscle spasms).   Unknown   pantoprazole  DR (PROTONIX ) 40 mg tablet Take 40 mg by mouth every day.   Unknown   ferrous sulfate (IRON 325) 325 (65 Fe) MG tablet Take 325 mg by mouth every day with breakfast.   Unknown   fluticasone  (FLONASE ) 50 MCG/ACT nasal spray 2 sprays by Nasal route use as needed for Rhinitis or Allergies.   Unknown   DULoxetine  DR (CYMBALTA ) 30 mg DR capsule Take 30 mg by mouth every day.   Unknown   Vital signs:  Temp:  [36.6 C (97.9 F)-37.1 C (98.8 F)] 37 C (98.6 F) (06/30 1526) Heart Rate:  [60-88] 61 (06/30 1526) Respirations:  [12-19] 18 (06/30 1526) Blood Pressure: (139-194)/(75-102) 147/78 (06/30 1526)  Review of systems:  Pain:Poorly controlled Nausea/Vomiting:No Bowel Movement :No Respiratory distress:No  Physical exam:  Gen: NAD, resting in bed HEENT: PERRLA, EOMI, trachea midline CV: regular rate, HDS, good peripheral perfusion Pulmonary: regular respiratory rate, EWOB, symmetric chest rise Abd: soft, nontender, nondistended  Extremities RUE: No TTP SILT r/m/u/ax Motor +r/m/u/ain/pin/ax Compartments soft, compressible Radial pulse 2+ Fingers wwp, BCR x5  LUE SILT r/m/u/ax Motor +r/m/u/ain/pin/ax Compartments soft, compressible Radial pulse 2+ Fingers wwp, BCR x5  Pelvis: stable to rocking  RLE: Knee immobilizer TTP about ankle SILT sp/dp/t/sa/su Motor +ehl/fhl/ta/gsc Compartments soft, compressible Toes wwp, BCR 2+ DP  pulse  LLE: No TTP SILT sp/dp/t/sa/su Motor +ehl/fhl/ta/gsc Compartments soft, compressible Toes wwp, BCR x5 2+ DP pulse  Psych: affect appropriate and congruent with mood  Lab: Electrolytes:No results found for: NA, K, CL, CO2 ABG:No results found for: PHART, PO2ART, PCO2ART, BEART  Hemo:No results found for: WBC, HGB, HCT, PLT Coag: AST (SGOT)  Date Value Ref Range Status  06/27/2024 23 10 - 42 U/L Final   ALT (SGPT)  Date Value Ref Range Status  06/27/2024 17 17 - 63 U/L Final   aPTT  Date Value Ref Range Status  06/27/2024 26.7 25.1 - 36.5 sec Final   Alkaline Phosphatase  Date Value Ref Range Status  06/27/2024 63 38 - 126 U/L Final   Albumin,Bcg-Serum  Date Value Ref Range Status  06/27/2024 3.7 3.5 - 5.0 gm/dL Final  POCT Glucose :  Poct Glucose(Meter)  Date Value Ref Range Status  06/28/2024 105 70 - 150 mg/dL Final  93/69/7974 98 70 - 150 mg/dL Final  93/69/7974 90 70 - 150 mg/dL Final   Documentation of radiology results: Radiology Results (last 1 day)     Procedure Component Value Units Date/Time   CT Knee Right wo Contrast [[id number]] Collected: 06/27/24 1721   Order Status: Completed Updated: 06/28/24 1404   Narrative:     EXAM: CT KNEE RIGHT WO CONTRAST  TECHNIQUE: CT extremity images of the right knee without contrast were obtained. Sagittal and coronal reformatted images were reviewed.   CLINICAL INDICATION:  trauma. Ground level fall with periprosthetic fracture.   COMPARISON: Same day radiographs.  FINDINGS:    BONES AND JOINTS: Partially visualized right femur endoprosthesis without identified hardware complication. Acute comminuted and mildly displaced periprosthetic fracture involving the medial condyle with a vertical component adjacent to the hardware bone  at junction. The fracture lucency extends to the trochlea and intercondylar notch.  Questionable tiny nondisplaced fracture of the tibial spine  (sagittal reconstructed images series 601 image 80).   Joint spaces appear aligned. Moderate lipohemarthrosis.SABRA    SOFT TISSUES: Mild soft tissue swelling of the knee. Vascular calcifications.    Impression:     1.    Acute comminuted and mildly displaced periprosthetic fracture of the right medial condyle with extension to the trochlear and intercondylar notch. Moderate lipohemarthrosis. 2.    Questionable nondisplaced fracture of the lateral tibial spine. 3.    Prior postsurgical changes of partially visualized endoprosthesis of the femur without hardware complication.  Preliminary findings were published to the electronic medical record by Reyes Caprio, MD.  COMMUNICATION:  None.  Prelim reconciliation: RPR3 - Agree  The images were reviewed and interpreted by Birdia Ames.   XR ANKLE RIGHT AP AND LATERAL [[id number]] Collected: 06/27/24 1735   Order Status: Completed Updated: 06/27/24 2206   Narrative:     PROCEDURE:  XR ANKLE RIGHT AP AND LATERAL, XR TIBIA FIBULA RIGHT AP AND LATERAL, XR KNEE RIGHT AP AND LATERAL, XR FOOT RIGHT AP AND LATERAL, XR FEMUR MIN 2 VIEWS RT  REASON FOR STUDY:  Pain/Deformity Following Trauma   COMPARISON: None.  FINDINGS:   Right femur: Minimally displaced periprosthetic fracture along the superior medial femoral condyle. Joint space extension is not excluded. Status post long stem right hip replacement. No joint dislocation. Lipohemarthrosis. No focal soft tissue defect.  Right knee: No acute fracture or dislocation. Joints are in normal alignment.  No focal soft tissue defect.  Right tibia/fibula: No acute fracture or dislocation. Joints are in normal alignment.  No focal soft tissue defect.  Right ankle: No acute fracture or dislocation. Joints are in normal alignment.  No focal soft tissue defect.  Right foot: No acute fracture or dislocation. Joints are in normal alignment.  No focal soft tissue defect.    Impression:      Minimally displaced periprosthetic fracture along the superior medial femoral condyle. Joint space extension is not excluded.  The images were reviewed and interpreted by Donnice Gains.   XR FEMUR MIN 2 VIEWS RT [[id number]] Collected: 06/27/24 1735   Order Status: Completed Updated: 06/27/24 2206   Narrative:     PROCEDURE:  XR ANKLE RIGHT AP AND LATERAL, XR TIBIA FIBULA RIGHT AP AND LATERAL, XR KNEE RIGHT AP AND LATERAL, XR FOOT RIGHT AP AND LATERAL, XR FEMUR MIN 2 VIEWS RT  REASON FOR STUDY:  Pain/Deformity Following  Trauma   COMPARISON: None.  FINDINGS:   Right femur: Minimally displaced periprosthetic fracture along the superior medial femoral condyle. Joint space extension is not excluded. Status post long stem right hip replacement. No joint dislocation. Lipohemarthrosis. No focal soft tissue defect.  Right knee: No acute fracture or dislocation. Joints are in normal alignment.  No focal soft tissue defect.  Right tibia/fibula: No acute fracture or dislocation. Joints are in normal alignment.  No focal soft tissue defect.  Right ankle: No acute fracture or dislocation. Joints are in normal alignment.  No focal soft tissue defect.  Right foot: No acute fracture or dislocation. Joints are in normal alignment.  No focal soft tissue defect.    Impression:     Minimally displaced periprosthetic fracture along the superior medial femoral condyle. Joint space extension is not excluded.  The images were reviewed and interpreted by Donnice Gains.   XR FOOT RIGHT AP AND LATERAL [[id number]] Collected: 06/27/24 1735   Order Status: Completed Updated: 06/27/24 2206   Narrative:     PROCEDURE:  XR ANKLE RIGHT AP AND LATERAL, XR TIBIA FIBULA RIGHT AP AND LATERAL, XR KNEE RIGHT AP AND LATERAL, XR FOOT RIGHT AP AND LATERAL, XR FEMUR MIN 2 VIEWS RT  REASON FOR STUDY:  Pain/Deformity Following Trauma   COMPARISON: None.  FINDINGS:   Right femur: Minimally displaced  periprosthetic fracture along the superior medial femoral condyle. Joint space extension is not excluded. Status post long stem right hip replacement. No joint dislocation. Lipohemarthrosis. No focal soft tissue defect.  Right knee: No acute fracture or dislocation. Joints are in normal alignment.  No focal soft tissue defect.  Right tibia/fibula: No acute fracture or dislocation. Joints are in normal alignment.  No focal soft tissue defect.  Right ankle: No acute fracture or dislocation. Joints are in normal alignment.  No focal soft tissue defect.  Right foot: No acute fracture or dislocation. Joints are in normal alignment.  No focal soft tissue defect.    Impression:     Minimally displaced periprosthetic fracture along the superior medial femoral condyle. Joint space extension is not excluded.  The images were reviewed and interpreted by Donnice Gains.   XR KNEE RIGHT AP AND LATERAL [[id number]] Collected: 06/27/24 1735   Order Status: Completed Updated: 06/27/24 2206   Narrative:     PROCEDURE:  XR ANKLE RIGHT AP AND LATERAL, XR TIBIA FIBULA RIGHT AP AND LATERAL, XR KNEE RIGHT AP AND LATERAL, XR FOOT RIGHT AP AND LATERAL, XR FEMUR MIN 2 VIEWS RT  REASON FOR STUDY:  Pain/Deformity Following Trauma   COMPARISON: None.  FINDINGS:   Right femur: Minimally displaced periprosthetic fracture along the superior medial femoral condyle. Joint space extension is not excluded. Status post long stem right hip replacement. No joint dislocation. Lipohemarthrosis. No focal soft tissue defect.  Right knee: No acute fracture or dislocation. Joints are in normal alignment.  No focal soft tissue defect.  Right tibia/fibula: No acute fracture or dislocation. Joints are in normal alignment.  No focal soft tissue defect.  Right ankle: No acute fracture or dislocation. Joints are in normal alignment.  No focal soft tissue defect.  Right foot: No acute fracture or  dislocation. Joints are in normal alignment.  No focal soft tissue defect.    Impression:     Minimally displaced periprosthetic fracture along the superior medial femoral condyle. Joint space extension is not excluded.  The images were reviewed and interpreted by Donnice Gains.   XR TIBIA FIBULA  RIGHT AP AND LATERAL [[id number]] Collected: 06/27/24 1735   Order Status: Completed Updated: 06/27/24 2206   Narrative:     PROCEDURE:  XR ANKLE RIGHT AP AND LATERAL, XR TIBIA FIBULA RIGHT AP AND LATERAL, XR KNEE RIGHT AP AND LATERAL, XR FOOT RIGHT AP AND LATERAL, XR FEMUR MIN 2 VIEWS RT  REASON FOR STUDY:  Pain/Deformity Following Trauma   COMPARISON: None.  FINDINGS:   Right femur: Minimally displaced periprosthetic fracture along the superior medial femoral condyle. Joint space extension is not excluded. Status post long stem right hip replacement. No joint dislocation. Lipohemarthrosis. No focal soft tissue defect.  Right knee: No acute fracture or dislocation. Joints are in normal alignment.  No focal soft tissue defect.  Right tibia/fibula: No acute fracture or dislocation. Joints are in normal alignment.  No focal soft tissue defect.  Right ankle: No acute fracture or dislocation. Joints are in normal alignment.  No focal soft tissue defect.  Right foot: No acute fracture or dislocation. Joints are in normal alignment.  No focal soft tissue defect.    Impression:     Minimally displaced periprosthetic fracture along the superior medial femoral condyle. Joint space extension is not excluded.  The images were reviewed and interpreted by Donnice Gains.   XR ANKLE LEFT AP AND LATERAL [[id number]] Collected: 06/27/24 1742   Order Status: Completed Updated: 06/27/24 2203   Narrative:     PROCEDURE:  XR TIBIA FIBULA LEFT AP AND LATERAL, XR KNEE LEFT AP AND LATERAL, XR ANKLE LEFT AP AND LATERAL  REASON FOR STUDY:  Pain/Deformity Following Trauma   COMPARISON:  None.  FINDINGS:   Left knee: No acute fracture or dislocation. Joints are in normal alignment.  No focal soft tissue defect.  Left tibia/fibula: No acute fracture or dislocation. Joints are in normal alignment.  No focal soft tissue defect.  Left ankle: No acute fracture or dislocation. Joints are in normal alignment.  No focal soft tissue defect.    Impression:     No acute osseous abnormality.     The images were reviewed and interpreted by Donnice Gains.   XR TIBIA FIBULA LEFT AP AND LATERAL [[id number]] Collected: 06/27/24 1742   Order Status: Completed Updated: 06/27/24 2203   Narrative:     PROCEDURE:  XR TIBIA FIBULA LEFT AP AND LATERAL, XR KNEE LEFT AP AND LATERAL, XR ANKLE LEFT AP AND LATERAL  REASON FOR STUDY:  Pain/Deformity Following Trauma   COMPARISON: None.  FINDINGS:   Left knee: No acute fracture or dislocation. Joints are in normal alignment.  No focal soft tissue defect.  Left tibia/fibula: No acute fracture or dislocation. Joints are in normal alignment.  No focal soft tissue defect.  Left ankle: No acute fracture or dislocation. Joints are in normal alignment.  No focal soft tissue defect.    Impression:     No acute osseous abnormality.     The images were reviewed and interpreted by Donnice Gains.   XR KNEE LEFT AP AND LATERAL [[id number]] Collected: 06/27/24 1742   Order Status: Completed Updated: 06/27/24 2203   Narrative:     PROCEDURE:  XR TIBIA FIBULA LEFT AP AND LATERAL, XR KNEE LEFT AP AND LATERAL, XR ANKLE LEFT AP AND LATERAL  REASON FOR STUDY:  Pain/Deformity Following Trauma   COMPARISON: None.  FINDINGS:   Left knee: No acute fracture or dislocation. Joints are in normal alignment.  No focal soft tissue defect.  Left tibia/fibula: No acute fracture or dislocation. Joints are  in normal alignment.  No focal soft tissue defect.  Left ankle: No acute fracture or dislocation. Joints are in normal  alignment.  No focal soft tissue defect.    Impression:     No acute osseous abnormality.     The images were reviewed and interpreted by Donnice Gains.   CT Brain C Spine wo Contrast [[id number]] Collected: 06/27/24 1655   Order Status: Completed Updated: 06/27/24 1749   Narrative:     EXAM: CT BRAIN C SPINE WO CONTRAST  CLINICAL INDICATION:   trauma  HEAD AND CERVICAL SPINE CTs: Noncontrast axial images. Axial, sagittal, and coronal reconstructed images.  COMPARISON:  None.  FINDINGS:   Support Devices and Implants: None  HEAD:  Brain: Prominent bilateral cerebral white matter low attenuation that is nonspecific but likely due to chronic ischemic microangiopathy.  Bilateral lateral ventricular bodies and ventricular atria secondary to asymmetric central volume loss.  No intraparenchymal hemorrhage.   No herniation.  Extra-axial Spaces: No epidural, subdural, or subarachnoid hemorrhage.  Ventricles: No hydrocephalus.  Bones/Soft Tissues: No calvarial  fracture.  Superior right frontoparietal large subgaleal scalp hematoma.  Orbits, Included: No injury. Bilateral pseudophakia.  Paranasal Sinuses, Included: No fluid. Postinflammatory left sphenoid sinus mucoperiosteal thickening in right sphenoid sinus webs. Bilateral maxillary sinus postinflammatory mucoperiosteal thickening. Right posterior ethmoid mucous retention cyst.  Tympanomastoid Cavities: Minimal bilateral mastoid effusions.  Other: Bilateral internal carotid and bilateral vertebral artery atherosclerotic calcifications.  CERVICAL SPINE:  Bones: No acute fracture.  Alignment: Normal.  Degenerative Disease: Multiple sites of mild to moderately severe spondylosis. Focal ossification in the nuchal ligament.  Soft Tissues: Locally enlarged thyroid  with multiple nodules better detected by concurrent chest CT. Largest nodule measures 1.6 cm and is in the left lobe.  Upper Chest: Normal lung  apices.  Other: Internal carotid artery atherosclerotic calcifications.  IMPRESSION:  1.    No evidence of acute intracranial injury.  2.    No acute cervical spine fracture.    3.    Goiter. Based on the 1.6 cm largest nodule, further evaluation with a nonemergent ultrasound is recommended if not already performed. Recommendation(s) based on ACR 2015 White Paper on Managing Incidental Thyroid  Nodules Detected on Imaging.  ----------   Preliminary findings were published to the electronic medical record by Reyes Caprio, MD.  COMMUNICATION:  None.  Prelim reconciliation: RPR2   Agree with Incidental Finding  INCIDENTAL FOLLOW-UP The following impression points have been identified for follow up: # 2.  The images were reviewed and interpreted by Quita LELON Charters, MD.   CT Trauma Chest Abdomen Pelvis w Contrast [[id number]] Collected: 06/27/24 1705   Order Status: Completed Updated: 06/27/24 1728   Narrative:     EXAM: CT TRAUMA ANGIOGRAM CHEST ABDOMEN PELVIS  CLINICAL INDICATION:  trauma  CHEST AND ABDOMEN-PELVIS CTAs: Helical multidetector scanning was performed in the arterial phase after the intravenous administration of intravenous contrast. Scanning of the abdomen and pelvis was then performed in the portal venous phase. Axial,  sagittal and coronal reconstructed images were reviewed. 3D reconstructions with MIP images were performed.  COMPARISON: None available  FINDINGS:  Support Devices:  None.   CHEST:  Airways/Lungs/Pleura: Patent airways.  No pulmonary contusions or lacerations.  Left upper lobe calcified granuloma.  No pleural fluid.  No pneumothorax.  Vascular: No acute injury.  Aorta atherosclerotic plaque. Multivessel coronary artery atherosclerotic calcifications.  Mediastinum: No hemomediastinum.  Heart size normal.  No pericardial fluid.  Lymph Nodes: Mediastinal and left hilar calcified  lymph nodes.  Bones, including Thoracic Spine: No acute  fracture.  Normal thoracic spine alignment.  Extensive vertebral ankylosis secondary to diffuse idiopathic skeletal hyperostosis.  Soft Tissues: Normal.   Other:  None.  ABDOMEN-PELVIS:  Liver: No injury.    Multiple predominantly low attenuation masses throughout the liver are mostly subcentimeter with others that range up in size to 3.6 cm x 2.1 cm x 1.7 cm (segment IV). Most are well-defined, but several subcentimeter lesions in the right lobe are faint  and poorly defined.  Gallbladder/Biliary Tree: No intrahepatic or extrahepatic biliary ductal dilatation.  Normal gallbladder.  Spleen: No injury.  Splenic calcified granulomas.   Pancreas: [No injury.]  [Normal.]  Stomach/Bowel/Mesentery: No injury.  Normal.  Adrenal Glands: No injury.  Normal.  Kidneys/Ureters: No injury.  Bilateral renal cortical thinning.  Bilateral collecting system nephrolithiasis without hydronephrosis.  Urinary Bladder: No injury.  Normal.  Reproductive Organs: Region of prostate bed partially obscured by artifact from right hip arthroplasty. Prostatectomy and bilateral pelvic sidewall surgical clips.  Lymph Nodes: No lymphadenopathy. Portacaval calcified lymph node.   Peritoneum/Retroperitoneum: No free fluid.  No retroperitoneal hematoma.  Vessels: No acute injury.  No aortoiliac aneurysm.  Aortoiliac atherosclerosis.  Portal, splenic and superior mesenteric veins are patent.  Bones, including Lumbar Spine: No acute fracture or dislocation.  Normal spinal alignment.    Multilevel spondylosis. L5 laminectomy defects. L5-S1 bilateral posterior rod and screw fixation with interbody spacer from discectomy.  Right hip arthroplasty with long femoral stem related to limb sparing femoral resection.  Soft Tissues: Normal.  Other: None.  IMPRESSION:   1.    No acute thoracic, abdominal, or pelvic injury.    2.    Multiple liver masses ranging from subcentimeter to 3.6 cm in size. Most have a  nonaggressive appearance that suggests multiple benign cysts or biliary hamartomas. However, malignancy cannot be excluded. If there are no prior studies with which  comparison can document long-term stability, further evaluation with a nonemergent MRI without/with contrast is recommended.  ----------  INCIDENTAL FOLLOW-UP:  The following impression points have been identified for follow up: # 2.        New radiologic findings: None  C Spine Clearance ?: Yes Patient was evaluated today for clearance of C-spine precautions after consultation and evaluation of appropriate imaging.  Patient was awake and alert and cooperative for exam. Patient without neurologic symptoms or neck pain. Patient did not exhibit any focal tenderness to direct palpation of the cervical spine.  Patient was able to move head in all directions without limitation in the range of motion or without inciting additional pain or discomfort. No midline tenderness. Denies pain, weakness or numbness with flexion, extension, or rotation of the neck. Aspen collar removed. C-collar cleared by Nexus Criteria.  Based on this examination, C-spine precautions are no longer required and may be discontinued.  T & L Spine Clearance ? : Patient was evaluated today for clearance of thoracic and lumbar spine precautions after consultation and evaluation of appropriate imaging.  Patient with no documented thoracic or lumbar spine fractures and thoracic and lumbar spine is cleared.    Incidental radiologic findings: Liver nodules, thyroid  nodule. Patient/family notified.  Injury complex with plans:   #R periprosthetic femur fx - pending OR w/ Ortho  No further imaging indicated. Trauma 1 will sign off. Cosigned by Cato Olla Heinz, MD at 07/05/2024  3:43 AM EDT Electronically signed by Payton Curlee Fines, MD at 06/28/2024  5:42 PM EDT Electronically  signed by Cato Olla Heinz, MD at 07/05/2024  3:43 AM  EDT  Associated attestation - Cato Olla Heinz, MD - 07/05/2024  3:43 AM EDT Formatting of this note might be different from the original. Agree  Mihir Heinz Cato, MD [facility] Fellow

## (undated) NOTE — Progress Notes (Signed)
 Formatting of this note is different from the original. This patient is on [facility] Orthopedic Surgery Internal Medicine Co-Management Team W.  Orthopedic Surgery can be reached at pic [phone] for any questions regarding pain control or the injured/operative extremity. [facility] Team W can be reached at pic [phone] for any questions regarding vital sign abnormalities, blood sugar, urinary retention, constipation, or any other medical concerns.  In the event of a patient emergency, please page [facility] Team W at pic [phone].  Internal Medicine Progress Note  Service: [facility] Medicine Team [facility] Day: LOS: 4 days  Attending: Almarie Gwen Sharps, MD, MD  Subjective / Interval History   No acute events overnight. Pt received dilaudid  y'day for pain. Wife at bedside said it made him loopy, but was finally able to get relief from the pain. Denies CP/SOB. Going to OR today.  Objective  Physical Exam (5) 24hour vital signs: Temp:  [36.3 C (97.3 F)-37.2 C (99 F)] 36.6 C (97.9 F) Heart Rate:  [70-81] 76 Respirations:  [14-20] 15 Blood Pressure: (135-177)/(56-90) 175/82  @LASTSAO2 (1)@ Weight: 112.8 kg (248 lb 11.2 oz)   I/O last 3 completed shifts: In: 120 [P.O.:120] Out: 800 [Urine:800]  GEN:  NAD, conversant HENT:  NCAT, moist mucus membranes CV:  RRR, no M/R/G PULM:  CTAB, no W/R/R ABD:  Soft, NT/ND, NABS MSK:  R knee immobilizer in place SKIN:  Bruising to R forehead over eye  Medications Scheduled Meds:  ondansetron  ODT  4 mg Oral Once   ondansetron   4 mg IV PUSH Once   CeFAZolin  ( ANCEF ) IVPB in Dextrose  - Standard  2 g Intravenous Q8H   [Transfer Hold] normal saline  10 mL Intravenous EVERY 8 HOURS   Enoxaprin [place] inj for VTE Prophylaxis (HIGH risk for VTE)  30 mg Subcutaneous Q12H SCH   [Transfer Hold] acetaminophen   1,000 mg Oral Q8H   [Transfer Hold] sennosides  2 tablet Oral BID   [Transfer Hold] polyethylene glycol  17 g Oral Daily   [Transfer Hold] moisturizer/plaque  preventer  1 each Mouth/Throat BID   [Transfer Hold] gabapentin   300 mg Oral Nightly   [Transfer Hold] AmLODIPine   10 mg Oral Daily   [Held by Provider] apixaban   2.5 mg Oral BID   [Transfer Hold] atorvastatin   20 mg Oral Nightly   [Transfer Hold] carvedilol   25 mg Oral Q12H   [Transfer Hold] cholecalciferol   2,000 Units Oral Daily   [Transfer Hold] pantoprazole  DR  40 mg Oral Daily   [Transfer Hold] DULoxetine  DR  30 mg Oral Daily   Continuous Infusions:  lactated ringers      PRN Meds:HYDROmorphone , HYDROmorphone , HYDROmorphone , oxyCODONE  IR **OR** oxyCODONE  IR, HYDROmorphone , HYDROmorphone , HYDROmorphone , oxyCODONE  IR **OR** oxyCODONE  IR, [COMPLETED] Insert Midline Catheter **AND** Maintain Midline Catheter **AND** [Transfer Hold] normal saline **AND** [Transfer Hold] sodium chloride , [COMPLETED] Insert Peripheral IV **AND** Maintain Peripheral IV **AND** [Transfer Hold] normal saline, [COMPLETED] Insert Peripheral IV **AND** Maintain Peripheral IV **AND** [Transfer Hold] normal saline, [Transfer Hold] ondansetron , [Transfer Hold] traMADol  **OR** [Transfer Hold] oxyCODONE  IR, [Transfer Hold] naloxone, [Transfer Hold] bisacodyl  **OR** [Transfer Hold] bisacodyl , [Transfer Hold] cetirizine, [Transfer Hold] furosemide , [Transfer Hold] HYDROmorphone , [Transfer Hold] methocarbamol , [Transfer Hold] fluticasone   Labs  CMP: Recent Labs    06/29/24 2321  NA 139  K 3.9  CL 107  CO2 27  BUN 20  CREATININE 1.2  GLU 91  CALCIUM  8.4*  LABALB 3.4*   CBC and Coags: Recent Labs    06/29/24 2321  WBC 6.7  HGB 13.9  HCT 42.5  MCV 86.0  PLT 156  INR 1.1*   Cardiac Enzymes: No results for input(s): CKTOTAL, CKMB, TROPONINI in the last 72 hours.  Assessment/Plan:  Bruce Ball is a 37 y.o. male with PMHx PE (12/2019), CKD3a, Hx of prostrate cancer (01/ 2003, Gleason 3+3, 03-10-2002 s/p radical prostatectomy pelvic lymph node disections, no recurrence), lumbosacral radiculopathy, CAD  (CAC on CT), PVCs on Coreg , HTN, GERD, vitamin D  deficiency, essential tremor, Hx Sarcoma (~1970, s/p revision right proximal femur replacement in setting of distal periprosthetic femur fx both components 09/2021), osteoporosis (Tx IV reclast yearly), HLD with Frailty score of 0.17 [place] right periprosthetic distal femoral condyle fracture.    #Pre-op Risk Stratification & Medical Optimization #Periprosthetic distal femur fx #Hx Sarcoma (~1970, s/p revision right proximal femur replacement in setting of distal periprosthetic femur fx both components 09/2021) #Pain Management CT Femur R: displaced periprosthetic fracture along the superior medial femoral condyle. Revised R hip prosthesis, now with periprosthetic distal femur fx. Functional status appears fair; frailty index 0.17. No current cardiac symptoms;  - surgical management per ortho - RCRI = 1 (Hx CAD); low risk for proposed surgery - cont on HYDROmorphone  PRN + methocarbamol  PRN. - Pain management per ortho - Tylenol  1g TID PRN - senna + miralax  for bowel regimen   #Hx PE On for prior PE (12/2019). INR 1.4, PT 15.6.  - hold apixaban  pre-op; coordinate timing with anesthesia and ortho. No bridging indicated if low to moderate risk. - Resume post-op when hemostasis secure. No recent VTE.  #HTN BP 194/94 on admission; patient on amlodipine  10 mg daily and and carvedilol  25 mg BID  - Continue amlodipine  and coreg   #CKD3a (Cr 1.5, baseline per chart) Labs otherwise stable (K 4.5, CO2 24, BUN 20) - Avoid nephrotoxins (NSAIDs, contrast) - Ensure adequate volume status pre/post-op  #CAD #PVC On carvedilol  25 mg BID, well tolerated. No CP, SOB, or recent angina. Low suspicion for active cardiac ischemia. - No further cardiac work-up indicated unless new symptoms emerge. - Coreg  held on admission, however, no need to hold prei-op BB in pt who is on long standing, stable dose and stable  vitals -Continue Coreg   #Osteoporosis On annual Reclast. Recent fracture underscores disease severity. - Ensure adequate calcium  + vit D intake post-op. - Consider DEXA recheck post-recovery  #Hx prostrate cancer (01/ 2003, Gleason 3+3, 03-10-2002 s/p radical prostatectomy pelvic lymph node disections, no recurrence) #Lumbosacral radiculopathy #Chronic R Hip Pain Stable. New pain from new fracture.  - continue gabapentin  300 mg nightly - continue duloxetine  30 mg daily  #Essential Tremor - continue home BB post-op  General:  - Diet/education: NPO--> Regular diet  - DVT Prophylaxis: Lovenox   - Functional Status/Consults: PT/OT  - Lines/Catheters currently in place: PIV  - Code Status: Full Code  Discharge Planning   Anticipated Discharge Date TBD  Anticipated Discharge Location TBD  Barriers PT/OT clearance   Almarie Gwen Sharps, MD [facility] Medicine Team W [phone]  07/01/2024 12:15 PM EDT  This patient is on [facility] Orthopedic Surgery Internal Medicine Co-Management Team W.  Orthopedic Surgery can be reached at pic [phone] for any questions regarding pain control or the injured/operative extremity. [facility] Team W can be reached at pic [phone] for any questions regarding vital sign abnormalities, blood sugar, urinary retention, constipation, or any other medical concerns.  In the event of a patient emergency, please page [facility] Team W at pic [phone].  Electronically signed by Claudene Almarie Lamprey, MD at 07/01/2024  3:07 PM EDT Electronically signed by Claudene Almarie Lamprey, MD at 07/01/2024  3:07 PM EDT

## (undated) NOTE — Progress Notes (Signed)
 Formatting of this note is different from the original. Ortho Tertiary Exam:  Vitals:   06/28/24 1526  BP: (!) 147/78  Pulse: 61  Resp: 18  Temp: 37 C (98.6 F)  SpO2: 95%    NAD A&Ox3 NCAT,EOMI,MMM Chest stable, EWOB, symmetric chest rise  LUE: no deformity, effusions, soft tissue swelling or discoloration, no lacerations or abrasions, no crepitus palpated, compartments soft and compressible, SILT M/U/R, AIN/PIN/M/U/R motor intact, 2 + RP, brisk cap refill x 5  FROM of joints without pain  Shoulder: no ttp, FROM without pain, no apprehension  Elbow: no ttp, FROM without pain, no identifiable instability or lig laxity  Wrist: no ttp, FROM  Fingers: no ttp, FROM without pain  RUE:  no deformity, effusions, soft tissue swelling or discoloration, no lacerations or abrasions, no crepitus palpated, compartments soft and compressible, SILT M/U/R, AIN/PIN/M/U/R motor intact, 2 + RP, brisk cap refill x 5  FROM of joints without pain  Shoulder: no ttp, FROM without pain, no apprehension  Elbow: no ttp, FROM without pain, no identifiable instability or lig laxity  Wrist: no ttp, FROM  Fingers: no ttp, FROM without pain  Pelvis: no soft tissue swelling or discoloration, no palpable widening of symphysis,  stable to rocking  LLE: no deformity effusions, mild swelling on the lateral aspect of the left ankle, no crepitus palpated, compartments soft and compressible  SILT Su/Sa/SP/DP/T   +EHL/EDL/FHL/FDL/GSC/TA   2+ DP, brisk cap refill x 5 FROM of joints without pain  Hip: no ttp, no pain with log roll  Knee: no ttp, +SLR, FROM without pain, no lig laxity  Ankle: moderate ttp to squeeze, FROM without pain, no instability  Toes: no ttp, FROM without pain  RLE: KI in place, no wounds or bleeding appreciated, appropriate swelling  Compartments are soft and compressible, appropriately ttp SILT S/Su/SPN/DPN/T  Motor +EHL/FHL/GSC/TA  Foot wwp, 2+DP  Ankle: no ttp, FROM without pain, no  instability  Toes: no ttp, FROM without pain  XR order: None needed, L ankle XR reviewed and no evidence of acute fracture    Electronically signed by Butler Camellia Hamilton, MD at 06/28/2024  3:32 PM EDT

## (undated) NOTE — Progress Notes (Signed)
 Formatting of this note is different from the original. Orthopaedic Progress Note  Patient Info: Name: Bruce Ball  Admission Date: 06/27/2024  3:16 PM  MRN: [id number] Hospital Day: Day 3 of ? (NO DRG)   Room/Bed: 5B42/02 DOB: 1946/02/16   Age: 40 y.o.    Interval Update / Subjective  - No acute ortho events overnight.  - No new numbness, tingling, weakness.  - Pain controlled - Denies Nausea, Vomiting, Diarrhea, Chest Pain, and SOB  Objective   Temp:  [36.6 C (97.9 F)-37.2 C (98.9 F)] 36.7 C (98.1 F) Heart Rate:  [65-80] 65 Respirations:  [18] 18 Blood Pressure: (103-175)/(55-77) 134/62  Labs: Recent Labs  Lab 06/27/24 1538 06/29/24 2321  NA 144 139  K 4.5 3.9  CL 112* 107  CO2 24 27  BUN 20 20  CREATININE 1.5* 1.2  GLU 100 91   Recent Labs  Lab 06/27/24 1538 06/29/24 2321  WBC 9.8 6.7  HGB 15.1 13.9  PLT 173 156   Recent Labs  Lab 06/27/24 1538 06/29/24 2321  INR 1.4* 1.1*   I/O I/O last 3 completed shifts: In: 360 [P.O.:360] Out: 700 [Urine:700] No intake/output data recorded.  Gen: NAD, AOx3 Pulm: Non-labored breathing Cards: Regular rate and rhythm   RLE: Dressings c/d/i SILT sp/dp/t/sa/su Motor +ehl/fhl/ta/gsc Swelling at the knee Toes wwp, BCR x5 2+ DP pulse  Assessment   Bruce Ball is a 48 y.o. male s/p GLF.  Injury/Diagnosis Complex  - R periprosthetic MFC fx  Surgeries - pending OR  Epic Problem List Active Hospital Problems   Diagnosis Date Noted   DVT (deep venous thrombosis) (CMS-HCC) 06/28/2024   HTN (hypertension), benign 06/28/2024   Acid reflux 06/28/2024   CHF (congestive heart failure) (CMS-HCC) 06/28/2024   Renal disease 06/28/2024   Closed displaced fracture of condyle of right femur (CMS-HCC) 06/27/2024   Trauma 06/27/2024   Plan  General  -IS - Monitor & replete lytes PRN - BR  Acute Pain - Multimodal analgesia  - Adjust PRN  RLE - Splints/Brace/Ex-fix: None - Drains/wound vacs:  None - Pending further surgeries this admission - Follow-up: TBD - PT/OT Current Activity Order (From admission, onward)         Ordered    Activity - Weight Bearing and Restrictions  UNTIL DISCONTINUED       Question Answer Comment  Activity Restrictions Weight Bearing   Weight bearing status RUE Weight bearing as tolerated   Weight bearing status LUE Weight bearing as tolerated   Weight bearing status RLE Non-weight bearing   Weight bearing status LLE Weight bearing as tolerated     06/27/24 2155        PPX - Antibiotics: 24 Hr Periop Ancef  - IP: Lovenox  - at Discharge: Complete initial 4 weeks ASA 81mg  BID  Discharge Planning - Estimated DC Date: TBD - Barrier: OR, PT - DC Location: TBD - PT/OT: Pending Eval  Massie Francis Collet, MD  [facility]    PRIMARY Ortho Trauma or Joints - PIC [phone]   ALL NEW CONSULTS - PIC [phone]  New Orthopaedic consult patients or newly found injuries  CURRENT/EXISTING PATIENTS Ortho TRAUMA CONSULTS - PIC [phone]  Ortho SPINE - PIC [phone]  Ortho HAND - PIC [id number]  Podiatry - PIC [phone]  Cosigned by Ahn, Jaimo, MD at 06/30/2024  9:21 AM EDT Electronically signed by Collet Massie Francis, MD at 06/30/2024  8:09 AM EDT Electronically signed by Ahn, Jaimo, MD at 06/30/2024  9:21 AM  EDT  Associated attestation - Ahn, Jaimo, MD - 06/30/2024  9:21 AM EDT Formatting of this note might be different from the original. I did not see and examine the patient however I have discussed the patient with the resident and agree with the resident's findings and plan as documented in their note.  Jaimo  Ahn, MD

## (undated) NOTE — Progress Notes (Signed)
 Formatting of this note is different from the original. Orthopaedic Progress Note  Patient Info: Name: Bruce Ball  Admission Date: 06/27/2024  3:16 PM  MRN: [id number] Hospital Day: Day 8 of ? (NO DRG)   Room/Bed: 5B32/01 DOB: 03/11/1946   Age: 66 y.o.    Interval Update / Subjective  - No acute events overnight.  - No new numbness, tingling, weakness.  - Pain controlled  - Denies Nausea, Vomiting, Diarrhea, Chest pain, and SOB - Dispo pending SAR  Objective   Temp:  [36.6 C (97.8 F)-37.3 C (99.1 F)] 36.9 C (98.4 F) Heart Rate:  [64-87] 79 Respirations:  [16-18] 18 Blood Pressure: (134-171)/(56-80) 142/80  Labs: Recent Labs  Lab 07/02/24 0122 07/03/24 0627  NA 138 141  K 4.1 3.8  CL 103 105  CO2 25 28  BUN 22 26*  CREATININE 1.1 1.2  GLU 165* 102   Recent Labs  Lab 07/02/24 0122 07/03/24 0627  WBC 6.7 9.6  HGB 13.9 13.4  PLT 202 199   Recent Labs  Lab 06/29/24 2321  INR 1.1*   I/O I/O last 3 completed shifts: In: 236 [P.O.:236] Out: 1840 [Urine:1840] No intake/output data recorded.  Gen: NAD, AOx3 Pulm: Non-labored breathing Cards: Regular rate and rhythm   RLE: Dressings c/d/I, knee immobilizer in place SILT sp/dp/t/sa/su Motor +ehl/fhl/ta/gsc Compartments soft, compressible Toes wwp, BCR x5 2+ DP pulse  Assessment   Bruce Ball is a 70 y.o. male s/p GLF.  Injury/Diagnosis Complex  - R periprosthetic MFC fx  Surgeries - R femur ORIF on 07/01/24 with Dr. Jackquelyn Salines Problem List Active Hospital Problems   Diagnosis Date Noted   DVT (deep venous thrombosis) (CMS-HCC) 06/28/2024   HTN (hypertension), benign 06/28/2024   Acid reflux 06/28/2024   CHF (congestive heart failure) (CMS-HCC) 06/28/2024   Renal disease 06/28/2024   Closed displaced fracture of condyle of right femur (CMS-HCC) 06/27/2024   Trauma 06/27/2024   Plan  General  -IS - Monitor & replete lytes PRN - BR  Acute Pain - Multimodal analgesia  - Adjust  PRN  RLE - Splints/Brace/Ex-fix: HKB locked at 20  - Drains/wound vacs: None - Surgery(s) complete - Follow-up: 7/22 with TL  - PT/OT  Current Activity Order (From admission, onward)         Ordered    Activity - Weight Bearing and Restrictions  UNTIL DISCONTINUED       Question Answer Comment  Activity Restrictions Weight Bearing   Activity Restrictions Brace Wear Precautions   Weight bearing status RUE Weight bearing as tolerated   Weight bearing status LUE Weight bearing as tolerated   Weight bearing status RLE Non-weight bearing   Weight bearing status LLE Weight bearing as tolerated   Bracing Wear RLE   Patient to wear RLE brace At all times (specify when to remove)   When to remove RLE brace N/A     07/01/24 1411        PPX - Antibiotics: 24 Hr Periop Ancef  - IP: Lovenox  - at Discharge: Complete initial 4 weeks ASA 81mg  BID  Discharge Planning - Estimated DC Date: 7/10 - Barrier: SAR - DC Location: SAR - PT/OT: Clearance not needed   Zetta Sherle RIGGERS, MPH  [facility]    PRIMARY Ortho Trauma or Joints - PIC [phone]   ALL NEW CONSULTS - PIC [phone]  New Orthopaedic consult patients or newly found injuries  CURRENT/EXISTING PATIENTS   Ortho TRAUMA CONSULTS - PIC [phone]  Kemp CURLIN - PIC [id number]    Ortho HAND - PIC [phone]    Podiatry - PIC [phone]    Electronically signed by Sherle Dies, PA-C at 07/05/2024 12:51 PM EDT

## (undated) NOTE — Anesthesia Procedure Notes (Signed)
 Associated Order(s): Peripheral IV Formatting of this note might be different from the original. Peripheral IV   Start Time:07/01/2024 1:36 PMEnd Time:07/01/2024 1:36 PM  Team: Performing Provider:Newsome, Dyjcj Wilkie Jes, CAA   Authorizing Provider:Thomas, Sonny, MD  Needle Gauge:20 G Location:left Orientation:hand Site Prep:alcohol     Electronically signed by Rosalind Andris Wilkie Jes, CAA at 07/01/2024  1:36 PM EDT

## (undated) NOTE — Unmapped External Note (Signed)
 Formatting of this note might be different from the original.  Problem: Infection/Isolation (Risk/Actual) Goal: Patient will be free of hospital acquired infection throughout hospitalization Outcome: Continue with Current Goal  Problem: Pain/Comfort Goal: Adequate pain control while hospitalized Outcome: Continue with Current Goal  Problem: Fall Risk Goal: Patient will remain as independent as possible Outcome: Continue with Current Goal Goal: Patient will have lower fall risk. Lower injury risk. Outcome: Continue with Current Goal Goal: Patient will remain safe from falls and injury Outcome: Continue with Current Goal Goal: Patient/family will understand fall prevention measures Outcome: Continue with Current Goal Goal: Patient/family will understand injury reduction measures Outcome: Continue with Current Goal Goal: Patient/family will comply with fall program Outcome: Continue with Current Goal Goal: Patient/family will verbalize fall prevention strategies to implement after discharge Outcome: Continue with Current Goal  Electronically signed by Nguyen, Lien, RN at 06/29/2024  7:44 AM EDT

## (undated) NOTE — Progress Notes (Signed)
 Formatting of this note might be different from the original.  Care Management Progress Note  1. Recommendation: TBD (potentially SAR in )  2. Interventions: Inbound call from [facility] with [facility] [phone] / [phone] he stated that there is no accepting admitting surgeon, there is currently no bed availability, advised that patient should stay at [facility] and have the surgery performed. Follow up call with Christ with [facility] transfer center with wife at the bedside.  Met with Rainell NP, Ola Floor manager, Wife and Patient at the bedside... Wife verbalized wanting to be closer to home and having Dr. Cole that did previous surgery to do the patient's surgery again. She stated for his last surgery he required rehab after his surgery was done, her and her daughter verified the rehab facility to be [facility] [address] [place], [place] [address] [phone] / [phone].   Wife stated she did not want to use VA benefits and would like to use Medicare benefits.   Informed Medical team to speak with wife as it seems she may stay here at [facility] for Sx and hopefully patient will be recommended to go to SAR by PT/OT and referrals can be sent to [facility]. Informed wife that she would not be able to ride in ambulance with the patient during transport.  3. Barriers: pending OR, PT/OT eval & recs  4. Outcomes: none  5. Next Steps: C3 will con't to work with Pt/Med team toward a safe d/c plan.    6. Expected Discharge Date: 07/01/2024   Alejos Elizbeth ALVERNA OBIE  [phone] Electronically signed by Guillemet, Arayfael, CM,RN at 06/29/2024  4:01 PM EDT

## (undated) NOTE — Progress Notes (Signed)
 Formatting of this note might be different from the original. Physical Therapy PT attempted to work with patient this afternoon. Per patient and wife, patient has significant pain and requested that PT and OT schedule sessions 1 in AM and 1 in PM to prevent increased pain and fatigue the following day. PT to follow up as able.  Duwaine Kiang, PT, DPT Electronically signed by Kiang Duwaine, PT at 07/07/2024  4:06 PM EDT

## (undated) NOTE — Progress Notes (Signed)
 Formatting of this note might be different from the original.  Care Management Progress Note  1. Recommendation: SAR  2. Interventions: Chart review, snap   3. Barriers: Transportation scheduled for 7/15  4. Outcomes: Per chart review, transportation has been scheduled for 7/15 at 5am. Pt discussed in snap huddle, RN and charge RN aware of early discharge for tomorrow. MSW spoke with Niki at [facility] to confirm planned dc and inquire what is needed for discharge. Per Niki, facility is requesting dc summary today to plan for pt's arrival tomorrow. MSW reached out to team via SecureChat to request summary, MSW to email summary once available.   REPORT NUMBER IS [phone], PT GOING TO ROOM 403.   5. Next Steps: MSW to assist as needed for discharge.   6. Expected Discharge Date: 07/13/24  UPDATE: DC Summary sent via email to [email or web address] as requested. Updated given to RN via SecureChat.   Lionel King, LMSW 6A/6B Social Worker  [phone]   Electronically signed by King Lionel, MSW at 07/12/2024 11:43 AM EDT Electronically signed by King Lionel, MSW at 07/12/2024 12:26 PM EDT

## (undated) NOTE — Progress Notes (Signed)
 Formatting of this note might be different from the original. Occupational Therapy Contact Note  Consult appreciated and chart reviewed. Per chart, patient pending OR with ortho for femur fracture management vs transfer to [facility]. Will continue to monitor chart.  Thank you, [place] Bennie, OTD, OTR/L Occupational Therapist  Electronically signed by Bennie, [place], OT at 06/30/2024  9:05 AM EDT

## (undated) NOTE — Unmapped External Note (Signed)
 Formatting of this note is different from the original.  Problem: DVT (Risk For) Goal: Demonstrates adequate circulation status as evidenced by the following indicators: Peripheral pulses strong/Peripheral pulses symmetrical/ Peripheral edema not present Outcome: Continue with Current Goal  Problem: Knowledge Deficit Goal: Patient will verbalize and/or demonstrate understanding of teaching by discharge Outcome: Continue with Current Goal Goal: Patient will Participate in learning opportunities offered by staff Outcome: Continue with Current Goal  Problem: Infection/Isolation (Risk/Actual) Goal: Patient will be free of hospital acquired infection throughout hospitalization Outcome: Continue with Current Goal  Problem: Pain/Comfort Goal: Adequate pain control while hospitalized Outcome: Continue with Current Goal  Problem: Fall Risk Goal: Patient will remain as independent as possible Outcome: Continue with Current Goal Goal: Patient will have lower fall risk. Lower injury risk. Outcome: Continue with Current Goal Goal: Patient will remain safe from falls and injury Outcome: Continue with Current Goal Goal: Patient/family will understand fall prevention measures Outcome: Continue with Current Goal Goal: Patient/family will understand injury reduction measures Outcome: Continue with Current Goal Goal: Patient/family will comply with fall program Outcome: Continue with Current Goal Goal: Patient/family will verbalize fall prevention strategies to implement after discharge Outcome: Continue with Current Goal  Problem: Skin Alteration (Risk for/Actual) Goal: Patient?s skin integrity will remain intact Outcome: Continue with Current Goal Goal: Patient will evidence no skin breakdown Outcome: Continue with Current Goal Goal: Patient will regain integrity of skin tissue Outcome: Continue with Current Goal  Problem: Anxiety Goal: The patient's care is consistent with the  individualized perioperative plan of care. Outcome: Continue with Current Goal Goal: The patient's right to privacy is maintained. Outcome: Continue with Current Goal Goal: Patient and family demonstrates knowledge of expected response to operative or invasive procedures Outcome: Continue with Current Goal  Problem: Anxiety Goal: The patient's care is consistent with the individualized perioperative plan of care. Outcome: Continue with Current Goal Goal: The patient's right to privacy is maintained. Outcome: Continue with Current Goal Goal: Patient and family demonstrates knowledge of expected response to operative or invasive procedures Outcome: Continue with Current Goal  Problem: Acute pain Goal: The patient demonstrates knowledge of pain management. Outcome: Continue with Current Goal  Problem: Risk for falls Goal: Patient is free from signs and symptoms of injuries Outcome: Continue with Current Goal  Problem: Nausea Goal: The patient receives prescribed medications and solutions, safely administered during the post-op period Outcome: Continue with Current Goal  Problem: Post-Operative Respiratory Complications Goal: Patient Will Remain Free Of Post-Operative Complications Outcome: Continue with Current Goal Goal: Effective Utilization Of Incentive Spirometry Description: INTERVENTIONS: 1. 10 times every hour while awake   2. Document volume every 4 hours Outcome: Continue with Current Goal Goal: Effective Pulmonary Toileting Description: INTERVENTIONS: 1. Cough/deep breath every 2 hours while awake Outcome: Continue with Current Goal Goal: Appropriate Oral Care Maintenance Description: INTERVENTIONS:  1. Oral care every 12 hours Outcome: Continue with Current Goal Goal: Adequate Patient/Family Education Description: INTERVENTIONS: 1. Patient/family education provided  Outcome: Continue with Current Goal Goal: Implementation Of Appropriate Early Mobility  Interventions Description: INTERVENTIONS: 1.  (Acute Care/Med-Surg) Get-Up-And-Go assessment completed, and interventions initiated 2.  (Critical/Intermediate Care) M.O.V.E.S. assessment completed, and interventions initiated Outcome: Continue with Current Goal Goal: Maintenance of HOB Elevation Description: INTERVENTIONS: 1. HOB 30 degrees Outcome: Continue with Current Goal  Problem: Anxiety Goal: The patient's care is consistent with the individualized perioperative plan of care. Outcome: Continue with Current Goal Goal: The patient's right to privacy is maintained. Outcome: Continue with Current Goal  Goal: Patient and family demonstrates knowledge of expected response to operative or invasive procedures Outcome: Continue with Current Goal  Problem: Acute pain Goal: The patient demonstrates knowledge of pain management. Outcome: Continue with Current Goal  Problem: Risk for falls Goal: Patient is free from signs and symptoms of injuries Outcome: Continue with Current Goal  Problem: Nausea Goal: The patient receives prescribed medications and solutions, safely administered during the post-op period Outcome: Continue with Current Goal  Problem: Decreased Gait Goal: Ambulate with least restrictive assistive device Outcome: Continue with Current Goal Goal: Improve balance Outcome: Continue with Current Goal  Electronically signed by Nelwyn Ards, RN at 07/05/2024  5:02 PM EDT

## (undated) NOTE — Progress Notes (Signed)
 Formatting of this note might be different from the original. Clinical Nutrition Follow up   Bruce Ball is a 38 y.o. male s/p GLF.  Injury/Diagnosis Complex  - R periprosthetic MFC fx  Surgeries - R femur ORIF on 07/01/24 with Dr. Jackquelyn  DT Assessment: pt stated with low appetite, tolerating p/o intake @<50%. No n/v/d. With BM. Taking in Boost.   Current Nutrition:DIET REGULAR Boost Plus Vanilla (Boost VHC or Ensure), Boost Plus Choc (Boost VHC or Ensure)  Meds: amlodipine ,enoxaparin ,sennosides Wt: 112.8 kg Significant Labs:  bun 32 Physical Findings: nourished, alert, oriented  Plan:   Continue diet. Encouraged pt to eat small meals to increase calories, pt verbalized understanding. Pleas add appetite stimulant Obtain new geriatric labs (Folate, Vit B12, Vit D 25-OH) as feasible and replete PRN  Continue MVI with minerals  Will monitor lab values, p/o intake   Ramon Cobble [phone] For nights/weekends page [phone]  Electronically signed by Cobble Ramon, DIETETIC TECH at 07/09/2024 12:45 PM EDT

## (undated) NOTE — Progress Notes (Signed)
 Formatting of this note might be different from the original. Occupational Therapy Contact Note OT attempted evaluation this afternoon.  Pt refused, stating he was feeling loopy from pain medication, unwilling to get up again.   Will re-attempt tomorrow.   Thank you,  Almarie Melynda Clause, OTR/L Occupational Therapist  Electronically signed by Melynda Almarie, OT at 07/02/2024  3:17 PM EDT

## (undated) NOTE — Unmapped External Note (Signed)
 Formatting of this note might be different from the original.  Problem: Care Management Goal: Patient will verbalize understanding of continuum of care and discharge needs. Outcome: Goal Met  Electronically signed by Deidra Cantor, CM,RN at 06/28/2024  5:00 PM EDT

## (undated) NOTE — Unmapped External Note (Signed)
 Formatting of this note is different from the original.  Problem: DVT (Risk For) Goal: Demonstrates adequate circulation status as evidenced by the following indicators: Peripheral pulses strong/Peripheral pulses symmetrical/ Peripheral edema not present Outcome: Continue with Current Goal  Problem: Knowledge Deficit Goal: Patient will verbalize and/or demonstrate understanding of teaching by discharge Outcome: Continue with Current Goal Goal: Patient will Participate in learning opportunities offered by staff Outcome: Continue with Current Goal  Problem: Infection/Isolation (Risk/Actual) Goal: Patient will be free of hospital acquired infection throughout hospitalization Outcome: Continue with Current Goal  Problem: Pain/Comfort Goal: Adequate pain control while hospitalized Outcome: Continue with Current Goal  Problem: Fall Risk Goal: Patient will remain as independent as possible Outcome: Continue with Current Goal Goal: Patient will have lower fall risk. Lower injury risk. Outcome: Continue with Current Goal Goal: Patient will remain safe from falls and injury Outcome: Continue with Current Goal Goal: Patient/family will understand fall prevention measures Outcome: Continue with Current Goal Goal: Patient/family will understand injury reduction measures Outcome: Continue with Current Goal Goal: Patient/family will comply with fall program Outcome: Continue with Current Goal Goal: Patient/family will verbalize fall prevention strategies to implement after discharge Outcome: Continue with Current Goal  Problem: Skin Alteration (Risk for/Actual) Goal: Patient?s skin integrity will remain intact Outcome: Continue with Current Goal Goal: Patient will evidence no skin breakdown Outcome: Continue with Current Goal Goal: Patient will regain integrity of skin tissue Outcome: Continue with Current Goal  Problem: Anxiety Goal: The patient's care is consistent with the  individualized perioperative plan of care. Outcome: Continue with Current Goal Goal: The patient's right to privacy is maintained. Outcome: Continue with Current Goal Goal: Patient and family demonstrates knowledge of expected response to operative or invasive procedures Outcome: Continue with Current Goal  Problem: Anxiety Goal: The patient's care is consistent with the individualized perioperative plan of care. Outcome: Continue with Current Goal Goal: The patient's right to privacy is maintained. Outcome: Continue with Current Goal Goal: Patient and family demonstrates knowledge of expected response to operative or invasive procedures Outcome: Continue with Current Goal  Problem: Acute pain Goal: The patient demonstrates knowledge of pain management. Outcome: Continue with Current Goal  Problem: Risk for falls Goal: Patient is free from signs and symptoms of injuries Outcome: Continue with Current Goal  Problem: Nausea Goal: The patient receives prescribed medications and solutions, safely administered during the post-op period Outcome: Continue with Current Goal  Problem: Post-Operative Respiratory Complications Goal: Patient Will Remain Free Of Post-Operative Complications Outcome: Continue with Current Goal Goal: Effective Utilization Of Incentive Spirometry Description: INTERVENTIONS: 1. 10 times every hour while awake   2. Document volume every 4 hours Outcome: Continue with Current Goal Goal: Effective Pulmonary Toileting Description: INTERVENTIONS: 1. Cough/deep breath every 2 hours while awake Outcome: Continue with Current Goal Goal: Appropriate Oral Care Maintenance Description: INTERVENTIONS:  1. Oral care every 12 hours Outcome: Continue with Current Goal Goal: Adequate Patient/Family Education Description: INTERVENTIONS: 1. Patient/family education provided  Outcome: Continue with Current Goal Goal: Implementation Of Appropriate Early Mobility  Interventions Description: INTERVENTIONS: 1.  (Acute Care/Med-Surg) Get-Up-And-Go assessment completed, and interventions initiated 2.  (Critical/Intermediate Care) M.O.V.E.S. assessment completed, and interventions initiated Outcome: Continue with Current Goal Goal: Maintenance of HOB Elevation Description: INTERVENTIONS: 1. HOB 30 degrees Outcome: Continue with Current Goal  Problem: Anxiety Goal: The patient's care is consistent with the individualized perioperative plan of care. Outcome: Continue with Current Goal Goal: The patient's right to privacy is maintained. Outcome: Continue with Current Goal  Goal: Patient and family demonstrates knowledge of expected response to operative or invasive procedures Outcome: Continue with Current Goal  Problem: Acute pain Goal: The patient demonstrates knowledge of pain management. Outcome: Continue with Current Goal  Problem: Risk for falls Goal: Patient is free from signs and symptoms of injuries Outcome: Continue with Current Goal  Problem: Nausea Goal: The patient receives prescribed medications and solutions, safely administered during the post-op period Outcome: Continue with Current Goal  Problem: Decreased Gait Goal: Ambulate with least restrictive assistive device Outcome: Continue with Current Goal Goal: Improve balance Outcome: Continue with Current Goal  Electronically signed by [name] Rife, RN at 07/04/2024  8:39 PM EDT

## (undated) NOTE — Progress Notes (Signed)
 Formatting of this note is different from the original. Orthopaedic Progress Note  Patient Info: Name: Bruce Ball  Admission Date: 06/27/2024  3:16 PM  MRN: [id number] Hospital Day: Day 4 of ? (NO DRG)   Room/Bed: 5B32/01 DOB: 27-May-1946   Age: 45 y.o.    Interval Update / Subjective  - No acute ortho events overnight.  - Resting comfortably in bed - Pain controlled - Denies Nausea, Vomiting, Diarrhea, Chest Pain, and SOB  Objective   Temp:  [36.4 C (97.5 F)-37.3 C (99.1 F)] 36.9 C (98.5 F) Heart Rate:  [71-81] 75 Respirations:  [18-20] 18 Blood Pressure: (135-157)/(56-78) 155/67  Labs: Recent Labs  Lab 06/27/24 1538 06/29/24 2321  NA 144 139  K 4.5 3.9  CL 112* 107  CO2 24 27  BUN 20 20  CREATININE 1.5* 1.2  GLU 100 91   Recent Labs  Lab 06/27/24 1538 06/29/24 2321  WBC 9.8 6.7  HGB 15.1 13.9  PLT 173 156   Recent Labs  Lab 06/27/24 1538 06/29/24 2321  INR 1.4* 1.1*   I/O I/O last 3 completed shifts: In: 120 [P.O.:120] Out: 800 [Urine:800] No intake/output data recorded.  Gen: NAD, AOx3 Pulm: Non-labored breathing Cards: Regular rate and rhythm   RLE: Dressings c/d/i SILT sp/dp/t/sa/su Motor +ehl/fhl/ta/gsc Swelling at the knee Toes wwp, BCR x5 2+ DP pulse  Assessment   Bruce Ball is a 23 y.o. male s/p GLF.  Injury/Diagnosis Complex  - R periprosthetic MFC fx  Surgeries - pending OR  Epic Problem List Active Hospital Problems   Diagnosis Date Noted   DVT (deep venous thrombosis) (CMS-HCC) 06/28/2024   HTN (hypertension), benign 06/28/2024   Acid reflux 06/28/2024   CHF (congestive heart failure) (CMS-HCC) 06/28/2024   Renal disease 06/28/2024   Closed displaced fracture of condyle of right femur (CMS-HCC) 06/27/2024   Trauma 06/27/2024   Plan  General  -IS - Monitor & replete lytes PRN - BR  Acute Pain - Multimodal analgesia  - Adjust PRN  RLE - Splints/Brace/Ex-fix: None - Drains/wound vacs: None -  Pending further surgeries this admission - Follow-up: TBD - PT/OT Current Activity Order (From admission, onward)         Ordered    Activity - Weight Bearing and Restrictions  UNTIL DISCONTINUED       Question Answer Comment  Activity Restrictions Weight Bearing   Weight bearing status RUE Weight bearing as tolerated   Weight bearing status LUE Weight bearing as tolerated   Weight bearing status RLE Non-weight bearing   Weight bearing status LLE Weight bearing as tolerated     06/27/24 2155        PPX - Antibiotics: 24 Hr Periop Ancef  - IP: Lovenox  - at Discharge: Complete initial 4 weeks ASA 81mg  BID  Discharge Planning - Estimated DC Date: TBD - Barrier: OR, PT - DC Location: TBD - PT/OT: Pending Eval  Massie Francis Collet, MD  [facility]    PRIMARY Ortho Trauma or Joints - PIC [phone]   ALL NEW CONSULTS - PIC [phone]  New Orthopaedic consult patients or newly found injuries  CURRENT/EXISTING PATIENTS Ortho TRAUMA CONSULTS - PIC [phone]  Ortho SPINE - PIC [phone]  Ortho HAND - PIC [phone]  Podiatry - PIC [phone]  Cosigned by Georgina Debby Raddle., MD at 07/21/2024  3:15 PM EDT Electronically signed by Collet Massie Francis, MD at 07/01/2024  6:40 AM EDT Electronically signed by Georgina Debby Raddle., MD at 07/21/2024  3:15 PM  EDT  Associated attestation - Georgina Debby Raddle., MD - 07/21/2024  3:15 PM EDT Formatting of this note might be different from the original. I did not see and examine the patient however I have discussed the patient with the resident and agree with the resident's findings and plan as documented in their note.  Debby Georgina, MD

## (undated) NOTE — Unmapped External Note (Signed)
 Formatting of this note is different from the original.  Problem: DVT (Risk For) Goal: Demonstrates adequate circulation status as evidenced by the following indicators: Peripheral pulses strong/Peripheral pulses symmetrical/ Peripheral edema not present Outcome: Progressing Toward Goal  Problem: Knowledge Deficit Goal: Patient will verbalize and/or demonstrate understanding of teaching by discharge Outcome: Progressing Toward Goal  Problem: Infection/Isolation (Risk/Actual) Goal: Patient will be free of hospital acquired infection throughout hospitalization Outcome: Progressing Toward Goal  Problem: Fall Risk Goal: Patient will remain as independent as possible Outcome: Progressing Toward Goal  Problem: Skin Alteration (Risk for/Actual) Goal: Patient?s skin integrity will remain intact Outcome: Progressing Toward Goal  Electronically signed by Geraline Senters, RN at 06/29/2024  8:15 PM EDT

## (undated) NOTE — H&P (Signed)
 Formatting of this note is different from the original. Orthopaedic Consult Note -   Orthopedics was consulted at 7:30 on June 27, 2024  The patient was seen and evaluated at 7:30pm on June 27, 2024 by Resident.   Referring Provider: ED/Trauma  CC: R knee pain  HPI: Bruce Ball is a 29 y.o.  male  [place] negative- Head trauma, negativeLOC. Patient noted immediate pain in the Right knee . Unable to bear weight following injury. Denies numbness, tingling or paresthesias in the Right lower extremity. Denies pain or injury to any other joint or extremity  Non-orthopaedic injuries include: nil  ROS: A 12 point ROS was done and is negative except per HPI  PMHx:  Past Medical History:  Diagnosis Date   CHF (congestive heart failure) (CMS-HCC)    HTN (hypertension)    Neuropathy    Partial deafness    PE (pulmonary thromboembolism) (CMS-HCC)    Renal disease     PSHx: No past surgical history on file.   Meds:  No current facility-administered medications on file prior to encounter.   No current outpatient medications on file prior to encounter.    Allergies: No Known Allergies     Famhx: Non-contributory  Physical Exam: BP (!) 194/94   Pulse 82   Temp 36.6 C (97.9 F)   Resp 18   Ht 1.803 m (5' 11)   Wt 113.4 kg (250 lb)   SpO2 96%   BMI 34.87 kg/m  NAD AOx3  NCAT, EOMI, MMM Chest: stable, EWOB, symmetric chest rise  RUE: no deformity, effusions, soft tissue swelling or discoloration, no lacerations or abrasions, no crepitus palpated, compartments soft and compressible, SILT M/U/R, AIN/PIN/M/U/R motor intact, 2 + RP, brisk cap refill x 5 FROM of joints without pain  LUE:  no deformity, effusions, soft tissue swelling or discoloration, no lacerations or abrasions, no crepitus palpated, compartments soft and compressible, SILT M/U/R, AIN/PIN/M/U/R motor intact, 2 + RP, brisk cap refill x 5  FROM of joints without pain  Pelvis: no soft tissue swelling or  discoloration, no palpable widening of symphysis,  stable to rocking  RLE: Significant effusion of the knee with obvious deformity. Skin intact, no abrasions, no lacerations.  + ecchymosis  Negative Lachman, Negative posterior drawer Foot WWP with brisk cap refill x 5.  Palpable 2+ DP/PT SILT Su/Sa/SP/DP/T,  +EHL/EDL/FHL/FDL/GSC / TA.  ABI  >1  Remainder of extremity without deformity, effusions, soft tissue swelling or discoloration, no crepitus palpated, compartments soft and compressible   Hip: no ttp, ROM without pain, no instability  Ankle: no ttp, FROM without pain, no lig laxity  Toes: no ttp, FROM without pain  LLE: no deformity effusions, soft tissue swelling or discoloration, no crepitus palpated, compartments soft and compressible   SILT Su/Sa/SP/DP/T   +EHL/EDL/FHL/FDL/GSC/TA  2+ DP, brisk cap refill x 5 FROM of joints without pain  Hip: no ttp, no pain with log roll  Knee: no ttp, +SLR, FROM without pain, no lig laxity  Ankle: no ttp, FROM without pain, no instability  Toes: no ttp, FROM without pain  Labs: Recent Labs    06/27/24 1538  WBC 9.8  HGB 15.1  HCT 46.9  PLT 173  ] Recent Labs    06/27/24 1538  NA 144  K 4.5  CL 112*  CO2 24  BUN 20  GLU 100  ] Recent Labs    06/27/24 1538  INR 1.4*  PT 15.6*  ]  Imaging:  XR R femur:    Assessment/Plan  Bruce Ball is a 60 y.o.  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 Patient will require operative fixation of fracture.   -  Risks, benefits, and alternatives were explained to the  patient in full with regards to his procedures.  Informed consent was then obtained.  Planned surgical site was marked.   -Consult has been discussed with Georgina, MD on call orthopedic attending   Anthony Karzon, MD  PGY 2  [facility]  __________________________________________  PRIMARY Ortho Trauma or Joints - PIC [phone]   ALL NEW CONSULTS - PIC [phone]  New Orthopaedic consult patients or newly found injuries  CURRENT/EXISTING PATIENTS Ortho TRAUMA CONSULTS - PIC [phone]  Ortho SPINE - PIC [phone]  Ortho HAND - PIC [phone]  Podiatry - PIC [phone]    Cosigned by Jackquelyn Debby Cough, MD at 07/01/2024  1:38 PM EDT Electronically signed by Mathilda Faden, MD at 06/27/2024  9:58 PM EDT Electronically signed by Jackquelyn Debby Cough, MD at 07/01/2024  1:38 PM EDT  Associated attestation - Large, Debby Cough, MD - 07/01/2024  1:38 PM EDT Formatting of this note might be different from the original. I personally saw and physically examined the patient independent of the resident. Discussed with resident and agree with the resident's assessment and plan of care documented in the resident's note. I took over care of this patient from Dr. Georgina this morning. I discussed the patient's injury and surgical intervention. Specifically I described the alternatives, benefits, and risks and the patient understood this and wished to proceed with surgery. Written consent obtained and patient appropriately marked. To OR for operative fixation of right medial condyle intraarticular distal femur fracture, periprosthetic.  Debby Cough Jackquelyn, MD

## (undated) NOTE — Unmapped External Note (Signed)
 Formatting of this note is different from the original.  PLAN OF CARE SUMMARY NOTE  NAME: Bruce Ball MRN: [id number] DOB: Apr 21, 1946  Admit date: 06/27/2024  Medical reason(s) for inpatient admission (i.e., service(s)/ treatment(s) mandating inpatient care) :     Anticipated Discharge Date: Tomorrow  Estimated length of stay for DRG(s):    Actual Length of Stay: 4 days  Discharge Diagnoses:  Fracture   Medications anticipated after discharge are listed in Discharge Medication Reconciliation.    Anticipated discharge to: [facility]    Discharge instructions include:   Current Activity Order (From admission, onward)         Ordered    Activity - Weight Bearing and Restrictions  UNTIL DISCONTINUED       Question Answer Comment  Activity Restrictions Weight Bearing   Weight bearing status RUE Weight bearing as tolerated   Weight bearing status LUE Weight bearing as tolerated   Weight bearing status RLE Non-weight bearing   Weight bearing status LLE Weight bearing as tolerated     06/27/24 2155        Future Appointments: Follow-up with Ortho   Also, the following future appointments already exist: No future appointments.  Electronically signed by  Hosey Oley Arrow, NP        07/01/2024  12:26 PM EDT  Cosigned by Almeda Lynwood Dale Mickey., MD at 07/01/2024  1:40 PM EDT Electronically signed by Arrow Hosey Oley, NP at 07/01/2024 12:26 PM EDT Electronically signed by Almeda Lynwood Dale Mickey., MD at 07/01/2024  1:40 PM EDT  Associated attestation - Almeda Lynwood Dale Mickey., MD - 07/01/2024  1:40 PM EDT Formatting of this note might be different from the original. I have reviewed this note and agree with its plan.  Almeda Beers Trauma

## (undated) NOTE — H&P (Signed)
 Formatting of this note is different from the original. [facility] Division of Acute Care Surgery Trauma H&P  HPI: 65 y.o[place]  Initial Assessment:  Activation Level: Level 2   Trauma Service: Team 1   Gender: Male (Assigned at Birth)   Mechanism of Injury: Fall from Standing   Primary Survey:    Airway: Airway Intact     Breathing: Unlabored Breathing     Circulation: Distal Pulses Present x 4 Ext     Eye::  4   Verbal::  5   Motor::  6   Motor Exam: Gross Motor Function Intact     Sensory Exam: Gross Sensory Function Intact     Pupils: PERRLA     EFAST - See Separate Procedure Note: Normal    History:  PMH (History of): Other (Comment)   PMH (History of) comment:  Afib and PE Surgical History: Yes   Surgical History Comment:  R femur and R hip Medications: Anticoagulation Therapy   Anticoagulant:  Apixaban   Review of Systems  Musculoskeletal:  Positive for joint pain and myalgias.  All other systems reviewed and are negative.  Vitals: BP (!) 182/100   Pulse 69   Temp 36.6 C (97.9 F)   Resp 12   Ht 1.803 m (5' 11)   Wt 113.4 kg (250 lb)   SpO2 95%   BMI 34.87 kg/m   Physical Exam Vitals and nursing note reviewed.  Constitutional:      General: He is not in acute distress.    Appearance: He is normal weight.     Interventions: Cervical collar and backboard in place.  HENT:     Head: Normocephalic.     Comments: R forehead hematoma    Right Ear: External ear normal.     Left Ear: External ear normal.     Nose: Nose normal.     Mouth/Throat:     Mouth: Mucous membranes are moist.     Pharynx: Oropharynx is clear.   Eyes:     Pupils: Pupils are equal, round, and reactive to light.   Cardiovascular:     Rate and Rhythm: Normal rate.     Pulses: Normal pulses.  Pulmonary:     Effort: Pulmonary effort is normal. No respiratory distress.     Breath sounds: No wheezing.  Chest:     Chest wall: No tenderness.   Abdominal:     General: There is distension.     Palpations: Abdomen is soft.     Tenderness: There is no abdominal tenderness.   Musculoskeletal:        General: Tenderness (RLE) present. No swelling or deformity. Normal range of motion.     Cervical back: Normal range of motion. No tenderness.   Skin:    General: Skin is warm and dry.     Capillary Refill: Capillary refill takes less than 2 seconds.   Neurological:     General: No focal deficit present.     Mental Status: He is alert and oriented to person, place, and time. Mental status is at baseline.     GCS: GCS eye subscore is 4. GCS verbal subscore is 5. GCS motor subscore is 6.     Sensory: Sensation is intact.     Motor: Motor function is intact.   Labs: Lab Results  Component Value Date/Time   WBC 9.8 06/27/2024 03:38 PM   RBC 5.41 06/27/2024 03:38 PM   HGB 15.1  06/27/2024 03:38 PM   HCT 46.9 06/27/2024 03:38 PM   PLT 173 06/27/2024 03:38 PM   PT 15.6 (H) 06/27/2024 03:38 PM   APTT 26.7 06/27/2024 03:38 PM   INR 1.4 (H) 06/27/2024 03:38 PM   Lab Results  Component Value Date/Time   NA 144 06/27/2024 03:38 PM   K 4.5 06/27/2024 03:38 PM   CL 112 (H) 06/27/2024 03:38 PM   CO2 24 06/27/2024 03:38 PM   BUN 20 06/27/2024 03:38 PM   CREATININE 1.5 (H) 06/27/2024 03:38 PM   GLU 100 06/27/2024 03:38 PM   LABALB 3.7 06/27/2024 03:38 PM   CALCIUM  8.6 (L) 06/27/2024 03:38 PM   MG 1.8 06/27/2024 03:38 PM   Imaging:  Radiology Results (last 1 day)     Procedure Component Value Units Date/Time   CT Brain C Spine wo Contrast [[id number]] Collected: 06/27/24 1655   Order Status: Completed Updated: 06/27/24 1749   Narrative:     EXAM: CT BRAIN C SPINE WO CONTRAST  CLINICAL INDICATION:   trauma  HEAD AND CERVICAL SPINE CTs: Noncontrast axial images. Axial, sagittal, and coronal reconstructed images.  COMPARISON:  None.  FINDINGS:   Support Devices and Implants: None  HEAD:  Brain: Prominent bilateral  cerebral white matter low attenuation that is nonspecific but likely due to chronic ischemic microangiopathy.  Bilateral lateral ventricular bodies and ventricular atria secondary to asymmetric central volume loss.  No intraparenchymal hemorrhage.   No herniation.  Extra-axial Spaces: No epidural, subdural, or subarachnoid hemorrhage.  Ventricles: No hydrocephalus.  Bones/Soft Tissues: No calvarial  fracture.  Superior right frontoparietal large subgaleal scalp hematoma.  Orbits, Included: No injury. Bilateral pseudophakia.  Paranasal Sinuses, Included: No fluid. Postinflammatory left sphenoid sinus mucoperiosteal thickening in right sphenoid sinus webs. Bilateral maxillary sinus postinflammatory mucoperiosteal thickening. Right posterior ethmoid mucous retention cyst.  Tympanomastoid Cavities: Minimal bilateral mastoid effusions.  Other: Bilateral internal carotid and bilateral vertebral artery atherosclerotic calcifications.  CERVICAL SPINE:  Bones: No acute fracture.  Alignment: Normal.  Degenerative Disease: Multiple sites of mild to moderately severe spondylosis. Focal ossification in the nuchal ligament.  Soft Tissues: Locally enlarged thyroid  with multiple nodules better detected by concurrent chest CT. Largest nodule measures 1.6 cm and is in the left lobe.  Upper Chest: Normal lung apices.  Other: Internal carotid artery atherosclerotic calcifications.  IMPRESSION:  1.    No evidence of acute intracranial injury.  2.    No acute cervical spine fracture.    3.    Goiter. Based on the 1.6 cm largest nodule, further evaluation with a nonemergent ultrasound is recommended if not already performed. Recommendation(s) based on ACR 2015 White Paper on Managing Incidental Thyroid  Nodules Detected on Imaging.  ----------   Preliminary findings were published to the electronic medical record by Reyes Caprio, MD.  COMMUNICATION:  None.  Prelim reconciliation: RPR2    Agree with Incidental Finding  INCIDENTAL FOLLOW-UP The following impression points have been identified for follow up: # 2.  The images were reviewed and interpreted by Quita LELON Charters, MD.   XR TIBIA FIBULA LEFT AP AND LATERAL - Preliminary [[id number]] Collected: 06/27/24 1742   Order Status: Completed Updated: 06/27/24 1743   This result has not been signed. Information might be incomplete.     Narrative:     PROCEDURE:  XR TIBIA FIBULA LEFT AP AND LATERAL, XR KNEE LEFT AP AND LATERAL, XR ANKLE LEFT AP AND LATERAL  REASON FOR STUDY:  Pain/Deformity Following Trauma   COMPARISON:  None.  FINDINGS:   Left knee: No acute fracture or dislocation. Joints are in normal alignment.  No focal soft tissue defect.  Left tibia/fibula: No acute fracture or dislocation. Joints are in normal alignment.  No focal soft tissue defect.  Left ankle: No acute fracture or dislocation. Joints are in normal alignment.  No focal soft tissue defect.    Impression:     No acute osseous abnormality.     XR ANKLE LEFT AP AND LATERAL - Preliminary [[id number]] Collected: 06/27/24 1742   Order Status: Completed Updated: 06/27/24 1743   This result has not been signed. Information might be incomplete.     Narrative:     PROCEDURE:  XR TIBIA FIBULA LEFT AP AND LATERAL, XR KNEE LEFT AP AND LATERAL, XR ANKLE LEFT AP AND LATERAL  REASON FOR STUDY:  Pain/Deformity Following Trauma   COMPARISON: None.  FINDINGS:   Left knee: No acute fracture or dislocation. Joints are in normal alignment.  No focal soft tissue defect.  Left tibia/fibula: No acute fracture or dislocation. Joints are in normal alignment.  No focal soft tissue defect.  Left ankle: No acute fracture or dislocation. Joints are in normal alignment.  No focal soft tissue defect.    Impression:     No acute osseous abnormality.     XR KNEE LEFT AP AND LATERAL - Preliminary [[id number]] Collected: 06/27/24 1742   Order  Status: Completed Updated: 06/27/24 1743   This result has not been signed. Information might be incomplete.     Narrative:     PROCEDURE:  XR TIBIA FIBULA LEFT AP AND LATERAL, XR KNEE LEFT AP AND LATERAL, XR ANKLE LEFT AP AND LATERAL  REASON FOR STUDY:  Pain/Deformity Following Trauma   COMPARISON: None.  FINDINGS:   Left knee: No acute fracture or dislocation. Joints are in normal alignment.  No focal soft tissue defect.  Left tibia/fibula: No acute fracture or dislocation. Joints are in normal alignment.  No focal soft tissue defect.  Left ankle: No acute fracture or dislocation. Joints are in normal alignment.  No focal soft tissue defect.    Impression:     No acute osseous abnormality.     XR ANKLE RIGHT AP AND LATERAL - Preliminary [[id number]] Collected: 06/27/24 1735   Order Status: Completed Updated: 06/27/24 1742   This result has not been signed. Information might be incomplete.     Narrative:     PROCEDURE:  XR ANKLE RIGHT AP AND LATERAL, XR TIBIA FIBULA RIGHT AP AND LATERAL, XR KNEE RIGHT AP AND LATERAL, XR FOOT RIGHT AP AND LATERAL, XR FEMUR MIN 2 VIEWS RT  REASON FOR STUDY:  Pain/Deformity Following Trauma   COMPARISON: None.  FINDINGS:   Right femur: Acute minimally displaced periprosthetic fracture along the superior medial femoral condyle. Joint space extension is not excluded. Status post long stem right hip replacement. No joint dislocation. Lipohemarthrosis. No focal soft tissue defect.  Right knee: No acute fracture or dislocation. Joints are in normal alignment.  No focal soft tissue defect.  Right tibia/fibula: No acute fracture or dislocation. Joints are in normal alignment.  No focal soft tissue defect.  Right ankle: No acute fracture or dislocation. Joints are in normal alignment.  No focal soft tissue defect.  Right foot: No acute fracture or dislocation. Joints are in normal alignment.  No focal soft tissue defect.     Impression:     Acute minimally displaced periprosthetic fracture along the superior medial femoral condyle. Joint  space extension is not excluded. No joint dislocation. Moderate lipohemarthrosis.     XR TIBIA FIBULA RIGHT AP AND LATERAL - Preliminary [[id number]] Collected: 06/27/24 1735   Order Status: Completed Updated: 06/27/24 1741   This result has not been signed. Information might be incomplete.     Narrative:     PROCEDURE:  XR ANKLE RIGHT AP AND LATERAL, XR TIBIA FIBULA RIGHT AP AND LATERAL, XR KNEE RIGHT AP AND LATERAL, XR FOOT RIGHT AP AND LATERAL, XR FEMUR MIN 2 VIEWS RT  REASON FOR STUDY:  Pain/Deformity Following Trauma   COMPARISON: None.  FINDINGS:   Right femur: Acute minimally displaced periprosthetic fracture along the superior medial femoral condyle. Joint space extension is not excluded. Status post long stem right hip replacement. No joint dislocation. Lipohemarthrosis. No focal soft tissue defect.  Right knee: No acute fracture or dislocation. Joints are in normal alignment.  No focal soft tissue defect.  Right tibia/fibula: No acute fracture or dislocation. Joints are in normal alignment.  No focal soft tissue defect.  Right ankle: No acute fracture or dislocation. Joints are in normal alignment.  No focal soft tissue defect.  Right foot: No acute fracture or dislocation. Joints are in normal alignment.  No focal soft tissue defect.    Impression:     Acute minimally displaced periprosthetic fracture along the superior medial femoral condyle. Joint space extension is not excluded. No joint dislocation. Moderate lipohemarthrosis.     XR FEMUR MIN 2 VIEWS RT - Preliminary [[id number]] Collected: 06/27/24 1735   Order Status: Completed Updated: 06/27/24 1741   This result has not been signed. Information might be incomplete.     Narrative:     PROCEDURE:  XR ANKLE RIGHT AP AND LATERAL, XR TIBIA FIBULA RIGHT AP AND LATERAL, XR KNEE RIGHT AP AND  LATERAL, XR FOOT RIGHT AP AND LATERAL, XR FEMUR MIN 2 VIEWS RT  REASON FOR STUDY:  Pain/Deformity Following Trauma   COMPARISON: None.  FINDINGS:   Right femur: Acute minimally displaced periprosthetic fracture along the superior medial femoral condyle. Joint space extension is not excluded. Status post long stem right hip replacement. No joint dislocation. Lipohemarthrosis. No focal soft tissue defect.  Right knee: No acute fracture or dislocation. Joints are in normal alignment.  No focal soft tissue defect.  Right tibia/fibula: No acute fracture or dislocation. Joints are in normal alignment.  No focal soft tissue defect.  Right ankle: No acute fracture or dislocation. Joints are in normal alignment.  No focal soft tissue defect.  Right foot: No acute fracture or dislocation. Joints are in normal alignment.  No focal soft tissue defect.    Impression:     Acute minimally displaced periprosthetic fracture along the superior medial femoral condyle. Joint space extension is not excluded. No joint dislocation. Moderate lipohemarthrosis.     XR FOOT RIGHT AP AND LATERAL - Preliminary [[id number]] Collected: 06/27/24 1735   Order Status: Completed Updated: 06/27/24 1741   This result has not been signed. Information might be incomplete.     Narrative:     PROCEDURE:  XR ANKLE RIGHT AP AND LATERAL, XR TIBIA FIBULA RIGHT AP AND LATERAL, XR KNEE RIGHT AP AND LATERAL, XR FOOT RIGHT AP AND LATERAL, XR FEMUR MIN 2 VIEWS RT  REASON FOR STUDY:  Pain/Deformity Following Trauma   COMPARISON: None.  FINDINGS:   Right femur: Acute minimally displaced periprosthetic fracture along the superior medial femoral condyle. Joint space extension is not excluded. Status post long stem right  hip replacement. No joint dislocation. Lipohemarthrosis. No focal soft tissue defect.  Right knee: No acute fracture or dislocation. Joints are in normal alignment.  No focal soft tissue  defect.  Right tibia/fibula: No acute fracture or dislocation. Joints are in normal alignment.  No focal soft tissue defect.  Right ankle: No acute fracture or dislocation. Joints are in normal alignment.  No focal soft tissue defect.  Right foot: No acute fracture or dislocation. Joints are in normal alignment.  No focal soft tissue defect.    Impression:     Acute minimally displaced periprosthetic fracture along the superior medial femoral condyle. Joint space extension is not excluded. No joint dislocation. Moderate lipohemarthrosis.     XR KNEE RIGHT AP AND LATERAL - Preliminary [[id number]] Collected: 06/27/24 1735   Order Status: Completed Updated: 06/27/24 1741   This result has not been signed. Information might be incomplete.     Narrative:     PROCEDURE:  XR ANKLE RIGHT AP AND LATERAL, XR TIBIA FIBULA RIGHT AP AND LATERAL, XR KNEE RIGHT AP AND LATERAL, XR FOOT RIGHT AP AND LATERAL, XR FEMUR MIN 2 VIEWS RT  REASON FOR STUDY:  Pain/Deformity Following Trauma   COMPARISON: None.  FINDINGS:   Right femur: Acute minimally displaced periprosthetic fracture along the superior medial femoral condyle. Joint space extension is not excluded. Status post long stem right hip replacement. No joint dislocation. Lipohemarthrosis. No focal soft tissue defect.  Right knee: No acute fracture or dislocation. Joints are in normal alignment.  No focal soft tissue defect.  Right tibia/fibula: No acute fracture or dislocation. Joints are in normal alignment.  No focal soft tissue defect.  Right ankle: No acute fracture or dislocation. Joints are in normal alignment.  No focal soft tissue defect.  Right foot: No acute fracture or dislocation. Joints are in normal alignment.  No focal soft tissue defect.    Impression:     Acute minimally displaced periprosthetic fracture along the superior medial femoral condyle. Joint space extension is not excluded. No joint  dislocation. Moderate lipohemarthrosis.     CT Trauma Chest Abdomen Pelvis w Contrast [[id number]] Collected: 06/27/24 1705   Order Status: Completed Updated: 06/27/24 1728   Narrative:     EXAM: CT TRAUMA ANGIOGRAM CHEST ABDOMEN PELVIS  CLINICAL INDICATION:  trauma  CHEST AND ABDOMEN-PELVIS CTAs: Helical multidetector scanning was performed in the arterial phase after the intravenous administration of intravenous contrast. Scanning of the abdomen and pelvis was then performed in the portal venous phase. Axial,  sagittal and coronal reconstructed images were reviewed. 3D reconstructions with MIP images were performed.  COMPARISON: None available  FINDINGS:  Support Devices:  None.   CHEST:  Airways/Lungs/Pleura: Patent airways.  No pulmonary contusions or lacerations.  Left upper lobe calcified granuloma.  No pleural fluid.  No pneumothorax.  Vascular: No acute injury.  Aorta atherosclerotic plaque. Multivessel coronary artery atherosclerotic calcifications.  Mediastinum: No hemomediastinum.  Heart size normal.  No pericardial fluid.  Lymph Nodes: Mediastinal and left hilar calcified lymph nodes.  Bones, including Thoracic Spine: No acute fracture.  Normal thoracic spine alignment.  Extensive vertebral ankylosis secondary to diffuse idiopathic skeletal hyperostosis.  Soft Tissues: Normal.   Other:  None.  ABDOMEN-PELVIS:  Liver: No injury.    Multiple predominantly low attenuation masses throughout the liver are mostly subcentimeter with others that range up in size to 3.6 cm x 2.1 cm x 1.7 cm (segment IV). Most are well-defined, but several subcentimeter lesions in the right  lobe are faint  and poorly defined.  Gallbladder/Biliary Tree: No intrahepatic or extrahepatic biliary ductal dilatation.  Normal gallbladder.  Spleen: No injury.  Splenic calcified granulomas.   Pancreas: [No injury.]  [Normal.]  Stomach/Bowel/Mesentery: No injury.  Normal.  Adrenal Glands:  No injury.  Normal.  Kidneys/Ureters: No injury.  Bilateral renal cortical thinning.  Bilateral collecting system nephrolithiasis without hydronephrosis.  Urinary Bladder: No injury.  Normal.  Reproductive Organs: Region of prostate bed partially obscured by artifact from right hip arthroplasty. Prostatectomy and bilateral pelvic sidewall surgical clips.  Lymph Nodes: No lymphadenopathy. Portacaval calcified lymph node.   Peritoneum/Retroperitoneum: No free fluid.  No retroperitoneal hematoma.  Vessels: No acute injury.  No aortoiliac aneurysm.  Aortoiliac atherosclerosis.  Portal, splenic and superior mesenteric veins are patent.  Bones, including Lumbar Spine: No acute fracture or dislocation.  Normal spinal alignment.    Multilevel spondylosis. L5 laminectomy defects. L5-S1 bilateral posterior rod and screw fixation with interbody spacer from discectomy.  Right hip arthroplasty with long femoral stem related to limb sparing femoral resection.  Soft Tissues: Normal.  Other: None.  IMPRESSION:   1.    No acute thoracic, abdominal, or pelvic injury.    2.    Multiple liver masses ranging from subcentimeter to 3.6 cm in size. Most have a nonaggressive appearance that suggests multiple benign cysts or biliary hamartomas. However, malignancy cannot be excluded. If there are no prior studies with which  comparison can document long-term stability, further evaluation with a nonemergent MRI without/with contrast is recommended.  ----------  INCIDENTAL FOLLOW-UP:  The following impression points have been identified for follow up: # 2.      CT Knee Right wo Contrast - Preliminary [[id number]] Collected: 06/27/24 1721   Order Status: Completed Updated: 06/27/24 1727   This result has not been signed. Information might be incomplete.     Narrative:     PRELIMINARY FINDINGS AND COMMUNICATION:  EXAM:  CT KNEE RIGHT WO CONTRAST MRN: [id number] INDICATION:  trauma ADDITIONAL  HISTORY: None available at this time. COMPARISON: Same day radiograph  FINDINGS/IMPRESSION:  1.    Acute comminuted minimally displaced fractures of the medial superior femoral condyle at the osseous hardware junction extending to the anterior cortex trochlea and anterior intercondylar notch. The patellofemoral and knee joints remains aligned.  2.    Questionable nondisplaced fracture of the tibial spine. 3.    Moderate lipohemarthrosis  Preliminary findings were published to the electronic medical record by Reyes Caprio, MD.  COMMUNICATION:  None.  Prelim reconciliation: N/A     Assessment & Plan:  Injury Complex:  Femoral condyle fracture Questionable right tibial spine fracture  Consulting Services:  Ortho Plan:  #Isolated ortho injury - recommend admission to ortho w/ medicine co-management if needed - Surgical plan per ortho  Trauma Tertiary to be completed 6/30 (Team 1)  Electronically signed by Arnaldo Arlan Hamilton, DNP       06/27/2024  4:36 PM EDT   Cosigned by Marius Comer Dire, MD at 06/27/2024  6:26 PM EDT Electronically signed by Hamilton Arnaldo Arlan, DNP at 06/27/2024  6:13 PM EDT Electronically signed by Marius Comer Dire, MD at 06/27/2024  6:26 PM EDT  Associated attestation - Marius Comer Dire, MD - 06/27/2024  6:26 PM EDT Formatting of this note might be different from the original. I have reviewed and discussed this patient with the APP and agree with the plan as documented in the progress note.  Comer Dire Marius, MD

## (undated) NOTE — Progress Notes (Signed)
 Formatting of this note is different from the original. Orthopaedic Progress Note  Patient Info: Name: Bruce Ball  Admission Date: 06/27/2024  3:16 PM  MRN: [id number] Hospital Day: Day 5 of ? (NO DRG)   Room/Bed: 5B32/01 DOB: 01-02-1946   Age: 21 y.o.    Interval Update / Subjective  POD1 s/p R femur ORIF on 07/01/24 w Dr. Jackquelyn  - No acute ortho events overnight.  - Resting comfortably in bed - Pain controlled - Denies Nausea, Vomiting, Diarrhea, Chest Pain, and SOB  Objective   Temp:  [36.3 C (97.3 F)-37.4 C (99.3 F)] 36.9 C (98.4 F) Heart Rate:  [70-98] 82 Respirations:  [11-20] 18 Blood Pressure: (120-177)/(61-102) 120/62  Labs: Recent Labs  Lab 06/29/24 2321 07/02/24 0122  NA 139 138  K 3.9 4.1  CL 107 103  CO2 27 25  BUN 20 22  CREATININE 1.2 1.1  GLU 91 165*   Recent Labs  Lab 06/29/24 2321 07/02/24 0122  WBC 6.7 6.7  HGB 13.9 13.9  PLT 156 202   Recent Labs  Lab 06/27/24 1538 06/29/24 2321  INR 1.4* 1.1*   I/O I/O last 3 completed shifts: In: 730 [I.V.:720; IV Piggyback:10] Out: 350 [Urine:300; Blood:50] No intake/output data recorded.  Gen: NAD, AOx3 Pulm: Non-labored breathing Cards: Regular rate and rhythm   RLE: Dressings c/d/i SILT sp/dp/t/sa/su Motor +ehl/fhl/ta/gsc Compartments soft, compressible Toes wwp, BCR x5 2+ DP pulse  Assessment   Bruce Ball is a 64 y.o. male s/p GLF.  Injury/Diagnosis Complex  - R periprosthetic MFC fx  Surgeries - R femur ORIF on 07/01/24 with Dr. Jackquelyn Salines Problem List Active Hospital Problems   Diagnosis Date Noted   DVT (deep venous thrombosis) (CMS-HCC) 06/28/2024   HTN (hypertension), benign 06/28/2024   Acid reflux 06/28/2024   CHF (congestive heart failure) (CMS-HCC) 06/28/2024   Renal disease 06/28/2024   Closed displaced fracture of condyle of right femur (CMS-HCC) 06/27/2024   Trauma 06/27/2024   Plan  General  -IS - Monitor & replete lytes PRN - BR  Acute  Pain - Multimodal analgesia  - Adjust PRN  RLE - Splints/Brace/Ex-fix: HKB locked at 20  - Drains/wound vacs: None - Surgery(s) complete - Follow-up: 2-3 weeks - PT/OT  Current Activity Order (From admission, onward)         Ordered    Activity - Weight Bearing and Restrictions  UNTIL DISCONTINUED       Question Answer Comment  Activity Restrictions Weight Bearing   Activity Restrictions Brace Wear Precautions   Weight bearing status RUE Weight bearing as tolerated   Weight bearing status LUE Weight bearing as tolerated   Weight bearing status RLE Non-weight bearing   Weight bearing status LLE Weight bearing as tolerated   Bracing Wear RLE   Patient to wear RLE brace At all times (specify when to remove)   When to remove RLE brace N/A     07/01/24 1411        PPX - Antibiotics: 24 Hr Periop Ancef  - IP: Lovenox  - at Discharge: Complete initial 4 weeks ASA 81mg  BID  Discharge Planning - Estimated DC Date: TBD - Barrier: PT/OT - DC Location: TBD - PT/OT: Pending Eval  Lesley Spurr, MD  [facility]    PRIMARY Ortho Trauma or Joints - PIC [phone]   ALL NEW CONSULTS - PIC [phone]  New Orthopaedic consult patients or newly found injuries  CURRENT/EXISTING PATIENTS Ortho TRAUMA CONSULTS - PIC [phone]  Bruce Ball - PIC [id number]  Ortho HAND - PIC [id number]  Podiatry - PIC [id number]  Electronically signed by Rebecka Lesley Leyden, MD at 07/02/2024  6:34 AM EDT Electronically signed by Rebecka Lesley Leyden, MD at 07/02/2024  9:05 AM EDT

## (undated) NOTE — Progress Notes (Signed)
 Formatting of this note is different from the original. This patient is on [facility] Orthopedic Surgery Internal Medicine Co-Management Team W.  Orthopedic Surgery can be reached at pic [phone] for any questions regarding pain control or the injured/operative extremity. [facility] Team W can be reached at pic [phone] for any questions regarding vital sign abnormalities, blood sugar, urinary retention, constipation, or any other medical concerns.  In the event of a patient emergency, please page [facility] Team W at pic [phone].  Internal Medicine Progress Note  Service: [facility] Medicine Team [facility] Day: LOS: 6 days  Attending: Almarie Gwen Sharps, MD, MD  Subjective / Interval History   No acute events overnight. S/p R femur ORIF POD#2. Denies SOB but does endorse R chest wall pain. Pain occurs with positional changes where he shifts towards the right. Denies pain with deep inspiration or cough  Objective  Physical Exam (5) 24hour vital signs: Temp:  [36.4 C (97.5 F)-37.3 C (99.1 F)] 37 C (98.6 F) Heart Rate:  [63-74] 69 Respirations:  [15-20] 18 Blood Pressure: (140-167)/(65-73) 144/70  @LASTSAO2 (1)@ Weight: 112.8 kg (248 lb 11.2 oz)   I/O last 3 completed shifts: In: 890 [P.O.:890] Out: 420 [Urine:420]  GEN:  NAD, conversant HENT:  NCAT, moist mucus membranes CV:  RRR, no M/R/G PULM:  CTAB, no W/R/R ABD:  Soft, NT/ND, NABS MSK:  R knee immobilizer in place, TTP of R upper chest wall, no bruising noted SKIN:  Bruising/hematoma to R forehead over eye, stable  Medications Scheduled Meds:  lidocaine   1 patch Transdermal Q24H   acetaminophen   1,000 mg Oral TID   normal saline  10 mL Intravenous EVERY 8 HOURS   Enoxaprin Earlimart inj for VTE Prophylaxis (HIGH risk for VTE)  30 mg Subcutaneous Q12H SCH   sennosides  2 tablet Oral BID   polyethylene glycol  17 g Oral Daily   moisturizer/plaque preventer  1 each Mouth/Throat BID   gabapentin   300 mg Oral Nightly   AmLODIPine   10 mg Oral  Daily   [Held by Provider] apixaban   2.5 mg Oral BID   atorvastatin   20 mg Oral Nightly   carvedilol   25 mg Oral Q12H   cholecalciferol   2,000 Units Oral Daily   pantoprazole  DR  40 mg Oral Daily   DULoxetine  DR  30 mg Oral Daily   Continuous Infusions:  PRN Meds:[COMPLETED] Insert Midline Catheter **AND** Maintain Midline Catheter **AND** normal saline **AND** sodium chloride , [COMPLETED] Insert Peripheral IV **AND** Maintain Peripheral IV **AND** normal saline, [COMPLETED] Insert Peripheral IV **AND** Maintain Peripheral IV **AND** normal saline, ondansetron , traMADol  **OR** oxyCODONE  IR, naloxone, bisacodyl  **OR** bisacodyl , cetirizine, furosemide , methocarbamol , fluticasone   Labs  CMP: Recent Labs    07/02/24 0122 07/03/24 0627  NA 138 141  K 4.1 3.8  CL 103 105  CO2 25 28  BUN 22 26*  CREATININE 1.1 1.2  GLU 165* 102  CALCIUM  8.1* 8.2*  MG  --  2.0  BILITOT  --  1.1  BILIDIR  --  0.2  ALKPHOS  --  54  AST  --  23  ALT  --  10*  PROT  --  5.7*  LABALB 3.6 3.6   CBC and Coags: Recent Labs    07/02/24 0122 07/03/24 0627  WBC 6.7 9.6  HGB 13.9 13.4  HCT 41.2 39.3*  MCV 84.0 84.0  PLT 202 199   Cardiac Enzymes: No results for input(s): CKTOTAL, CKMB, TROPONINI in the last 72 hours.  Assessment/Plan:  Bruce Ball is a 11 y.o. male with PMHx PE (12/2019), CKD3a, Hx of prostrate cancer (01/ 2003, Gleason 3+3, 03-10-2002 s/p radical prostatectomy pelvic lymph node disections, no recurrence), lumbosacral radiculopathy, CAD (CAC on CT), PVCs on Coreg , HTN, GERD, vitamin D  deficiency, essential tremor, Hx Sarcoma (~1970, s/p revision right proximal femur replacement in setting of distal periprosthetic femur fx both components 09/2021), osteoporosis (Tx IV reclast yearly), HLD with Frailty score of 0.17 who [place] airport with [place] right periprosthetic distal femoral condyle fracture.    #Pre-op Risk Stratification & Medical  Optimization #Periprosthetic distal femur fx s/p ORIF 7/3 #Hx Sarcoma (~1970, s/p revision right proximal femur replacement in setting of distal periprosthetic femur fx both components 09/2021) #Pain Management CT Femur R: displaced periprosthetic fracture along the superior medial femoral condyle. Revised R hip prosthesis, now with periprosthetic distal femur fx. Functional status appears fair; frailty index 0.17. No current cardiac symptoms;  - surgical management per ortho - RCRI = 1 (Hx CAD); low risk for proposed surgery - cont on HYDROmorphone  PRN + methocarbamol  PRN. - Pain management per ortho - Tylenol  1g TID PRN - senna + miralax  for bowel regimen  #Chest wall pain No concerning findings on exam Trial with lidocaine  patch to chest wall   #Hx PE (2023) On for prior PE (12/2019). INR 1.4, PT 15.6.  - hold apixaban  pre-op; coordinate timing with anesthesia and ortho. No bridging indicated if low to moderate risk. - Resume post-op when hemostasis secure. No recent VTE.  #HTN BP 194/94 on admission; patient on amlodipine  10 mg daily and and carvedilol  25 mg BID  - Continue amlodipine  and coreg   #CKD3a (Cr 1.5, baseline per chart) Labs otherwise stable (K 4.5, CO2 24, BUN 20) - Avoid nephrotoxins (NSAIDs, contrast) - Ensure adequate volume status pre/post-op  #CAD #PVC On carvedilol  25 mg BID, well tolerated. No CP, SOB, or recent angina. Low suspicion for active cardiac ischemia. - No further cardiac work-up indicated unless new symptoms emerge. - Coreg  held on admission, however, no need to hold prei-op BB in pt who is on long standing, stable dose and stable vitals -Continue Coreg   #Osteoporosis On annual Reclast. Recent fracture underscores disease severity. - Ensure adequate calcium  + vit D intake post-op. - Consider DEXA recheck post-recovery  #Hx prostrate cancer (01/ 2003, Gleason 3+3, 03-10-2002 s/p radical prostatectomy pelvic lymph node disections, no  recurrence) #Lumbosacral radiculopathy #Chronic R Hip Pain Stable. New pain from new fracture.  - continue gabapentin  300 mg nightly - continue duloxetine  30 mg daily  #Essential Tremor - continue home BB post-op  #Incidental findings Multiple predominantly low attenuation masses throughout the liver are mostly subcentimeter with others that range up in size to 3.6 cm x 2.1 cm x 1.7 cm (segment IV). Most are well-defined, but several subcentimeter lesions in the right lobe are faint and poorly defined. 2. Locally enlarged thyroid  with multiple nodules better detected by concurrent chest CT. Largest nodule measures 1.6 cm and is in the left lobe  Pt to follow up with incidental findings with primary providers in [place]  General:  - Diet/education: Regular diet  - DVT Prophylaxis: Lovenox   - Functional Status/Consults: PT/OT  - Lines/Catheters currently in place: PIV  - Code Status: Full Code  Discharge Planning   Anticipated Discharge Date TBD  Anticipated Discharge Location Anticipate SAR in [place]  Barriers PT/OT clearance / Accepting facility   Almarie Gwen Sharps, MD [facility] Medicine Team W [phone]  07/03/2024 12:15 PM EDT  This patient is on [facility] Orthopedic Surgery Internal Medicine Co-Management Team W.  Orthopedic Surgery can be reached at pic [phone] for any questions regarding pain control or the injured/operative extremity. [facility] Team W can be reached at pic [phone] for any questions regarding vital sign abnormalities, blood sugar, urinary retention, constipation, or any other medical concerns.  In the event of a patient emergency, please page [facility] Team W at pic [phone].  Electronically signed by Claudene Almarie Lamprey, MD at 07/03/2024 12:33 PM EDT Electronically signed by Claudene Almarie Lamprey, MD at 07/03/2024 12:34 PM EDT

## (undated) NOTE — Nursing Note (Signed)
 Formatting of this note might be different from the original. Discharge plan for patient: Patient's transportation is set up for 7/15 at 0500 for pick up. Patient is going to [facility] in [place] for SAR, room 403. Report number is [phone]. C3 placed packet in patient's chart. Packet includes printed narcotic prescription and AVS. Electronically signed by Anderson Connor, RN at 07/12/2024  7:56 PM EDT

## (undated) NOTE — Unmapped External Note (Signed)
 Formatting of this note is different from the original.  Problem: DVT (Risk For) Goal: Demonstrates adequate circulation status as evidenced by the following indicators: Peripheral pulses strong/Peripheral pulses symmetrical/ Peripheral edema not present Outcome: Continue with Current Goal  Problem: Knowledge Deficit Goal: Patient will verbalize and/or demonstrate understanding of teaching by discharge Outcome: Continue with Current Goal Goal: Patient will Participate in learning opportunities offered by staff Outcome: Continue with Current Goal  Problem: Infection/Isolation (Risk/Actual) Goal: Patient will be free of hospital acquired infection throughout hospitalization Outcome: Continue with Current Goal  Problem: Pain/Comfort Goal: Adequate pain control while hospitalized Outcome: Continue with Current Goal  Problem: Fall Risk Goal: Patient will remain as independent as possible Outcome: Continue with Current Goal Goal: Patient will have lower fall risk. Lower injury risk. Outcome: Continue with Current Goal Goal: Patient will remain safe from falls and injury Outcome: Continue with Current Goal Goal: Patient/family will understand fall prevention measures Outcome: Continue with Current Goal Goal: Patient/family will understand injury reduction measures Outcome: Continue with Current Goal Goal: Patient/family will comply with fall program Outcome: Continue with Current Goal Goal: Patient/family will verbalize fall prevention strategies to implement after discharge Outcome: Continue with Current Goal  Problem: Skin Alteration (Risk for/Actual) Goal: Patient?s skin integrity will remain intact Outcome: Continue with Current Goal Goal: Patient will evidence no skin breakdown Outcome: Continue with Current Goal Goal: Patient will regain integrity of skin tissue Outcome: Continue with Current Goal  Problem: Anxiety Goal: The patient's care is consistent with the  individualized perioperative plan of care. Outcome: Continue with Current Goal Goal: The patient's right to privacy is maintained. Outcome: Continue with Current Goal Goal: Patient and family demonstrates knowledge of expected response to operative or invasive procedures Outcome: Continue with Current Goal  Problem: Anxiety Goal: The patient's care is consistent with the individualized perioperative plan of care. Outcome: Continue with Current Goal Goal: The patient's right to privacy is maintained. Outcome: Continue with Current Goal Goal: Patient and family demonstrates knowledge of expected response to operative or invasive procedures Outcome: Continue with Current Goal  Problem: Acute pain Goal: The patient demonstrates knowledge of pain management. Outcome: Continue with Current Goal  Problem: Risk for falls Goal: Patient is free from signs and symptoms of injuries Outcome: Continue with Current Goal  Problem: Nausea Goal: The patient receives prescribed medications and solutions, safely administered during the post-op period Outcome: Continue with Current Goal  Problem: Post-Operative Respiratory Complications Goal: Patient Will Remain Free Of Post-Operative Complications Outcome: Continue with Current Goal Goal: Effective Utilization Of Incentive Spirometry Description: INTERVENTIONS: 1. 10 times every hour while awake   2. Document volume every 4 hours Outcome: Continue with Current Goal Goal: Effective Pulmonary Toileting Description: INTERVENTIONS: 1. Cough/deep breath every 2 hours while awake Outcome: Continue with Current Goal Goal: Appropriate Oral Care Maintenance Description: INTERVENTIONS:  1. Oral care every 12 hours Outcome: Continue with Current Goal Goal: Adequate Patient/Family Education Description: INTERVENTIONS: 1. Patient/family education provided  Outcome: Continue with Current Goal Goal: Implementation Of Appropriate Early Mobility  Interventions Description: INTERVENTIONS: 1.  (Acute Care/Med-Surg) Get-Up-And-Go assessment completed, and interventions initiated 2.  (Critical/Intermediate Care) M.O.V.E.S. assessment completed, and interventions initiated Outcome: Continue with Current Goal Goal: Maintenance of HOB Elevation Description: INTERVENTIONS: 1. HOB 30 degrees Outcome: Continue with Current Goal  Problem: Anxiety Goal: The patient's care is consistent with the individualized perioperative plan of care. Outcome: Continue with Current Goal Goal: The patient's right to privacy is maintained. Outcome: Continue with Current Goal  Goal: Patient and family demonstrates knowledge of expected response to operative or invasive procedures Outcome: Continue with Current Goal  Problem: Acute pain Goal: The patient demonstrates knowledge of pain management. Outcome: Continue with Current Goal  Problem: Risk for falls Goal: Patient is free from signs and symptoms of injuries Outcome: Continue with Current Goal  Problem: Nausea Goal: The patient receives prescribed medications and solutions, safely administered during the post-op period Outcome: Continue with Current Goal  Problem: Decreased Gait Goal: Ambulate with least restrictive assistive device Outcome: Continue with Current Goal Goal: Improve balance Outcome: Continue with Current Goal  Problem: Decreased ADL Independence Goal: Improve ADL independence Outcome: Continue with Current Goal  Electronically signed by [name] Rife, RN at 07/09/2024  8:45 PM EDT

## (undated) NOTE — Unmapped External Note (Signed)
 Formatting of this note is different from the original. ORIF, FRACTURE, FEMUR, DISTAL  Procedure Note  Bruce Ball 07/01/2024  PRE-OP DIAGNOSIS: R periprosthetic distal femur fx  POST-OP DIAGNOSIS:same   UNEXPECTED FINDINGS:None  PROCEDURE: Procedure(s): ORIF, FRACTURE, FEMUR, DISTAL - Right - Wound Class: Clean     ANESTHESIA: * No anesthesia type entered *  SURGEON(S): Surgeon(s): Haws, Laymon Norris, MD Gretel Therisa Jes, MD Large, Debby Cough, MD  STAFF: Circulator: Nina Cancel, RN Relief Circulator: [name] Genaro DASEN, RN Scrub Person: Colon, Shierley; Kayla Contes  ESTIMATED BLOOD LOSS: No EBL documented   DRAINS: none  TOTAL IV FLUIDS: per anesthesia  SPECIMENS: * No specimens in log *  IMPLANTS:  Implants     Implants     M208.440 - [id number]     Inventory Item: 208.440  -  SCREW BN RVRS CUT FLUT 6.5MM 7.9MM 2.9MM NS ST SLF (contains latex) Serial no.:  Model/Cat no.: [id number]   Implant name: [id number] - [id number] Laterality: Right Area: Leg   Manufacturer: [employer]  Date of Manufacture:     Action: Implanted and Explanted Number Used: 1    Device Identifier:  Device Identifier Type:     Date Implanted:          M208.437 - [id number]     Inventory Item: 208.437  -  SCREW BN RVRS CUT FLUT 6.5MM 7.9MM 2.9MM NS ST SLF (contains latex) Serial no.:  Model/Cat no.: [id number]   Implant name: [id number] - [id number] Laterality: Right Area: Leg   Manufacturer: [employer]ES [employer]  Date of Manufacture:     Action: Implanted Number Used: 1    Device Identifier:  Device Identifier Type:     Date Implanted: 07/01/2024         M219.99 - [id number]     Inventory Item: 219.99  -  WASHER ORTH NS SS 6.5/7/7. CNN SCR (contains latex) Serial no.:  Model/Cat no.: [id number]   Implant name: [id number] - [id number] Laterality: Right Area: Leg   Manufacturer: [employer]ES [employer]  Date of Manufacture:     Action: Implanted Number Used: 2     Device Identifier:  Device Identifier Type:     Date Implanted: 07/01/2024         M208.441 - [id number]     Inventory Item: 208.441  -  SCREW BN RVRS CUT FLUT 6.5MM 7.9MM 2.9MM NS ST SLF (contains latex) Serial no.:  Model/Cat no.: [id number]   Implant name: [id number] - [id number] Laterality: Right Area: Leg   Manufacturer: [employer]ES [employer]  Date of Manufacture:     Action: Implanted and Explanted Number Used: 1    Device Identifier:  Device Identifier Type:     Date Implanted:          F790.304 - [id number]     Inventory Item: 209.695  -  SCREW BN FT RVRS CUT FLUT 7.3MM 8.2MM 2.9MM NS CNN ST (contains latex) Serial no.:  Model/Cat no.: [id number]   Implant name: [id number] - [id number] Laterality: Right Area: Leg   Manufacturer: [employer]ES [employer]  Date of Manufacture:     Action: Implanted Number Used: 1    Device Identifier:  Device Identifier Type:     Date Implanted: 07/01/2024         M209.895 - [id number]     Inventory Item: 209.895  -  SCREW BN P/T RVRS CUT FLUT 7.3MM NS ST SLF DRL CNN (contains latex) Serial no.:  Model/Cat no.: [id number]   Implant name:  [id number] - [id number] Laterality: Right Area: Leg   Manufacturer: SY[employer]ES [employer]  Date of Manufacture:     Action: Implanted and Explanted Number Used: 1    Device Identifier:  Device Identifier Type:     Date Implanted:             Trays     Tray Name: LOG [id number] - SYNTHES 4.5MM LCP CONDYLAR  PLATES TRAY - 1        Tray Name: LOG [id number] - STRYKER COMBO NAIL TRAY - 1        Tray Name: LOG [id number] - STRYKER BIXCUT REAMERS TRAY - 1        Tray Name: KATHRIN [id number] - STRYKER SUPRACONDYLAR ADD-ON TRAY - 1        Tray Name: KATHRIN [id number] - SYNTHES PERIARTICULAR SCREWS AND INSTRUMENTS - 1        Tray Name: LOG [id number] - SYNTHES LCP LARGE FRAGMENT SCREW TRAY - 1        Tray Name: LOG [id number] -  - 1             IMPLANT TYPE:  Implant: [id number] - SYNTHES 4.5MM LCP CONDYLAR  PLATES TRAY  - 1 Type:  Implant: [id number] - STRYKER COMBO NAIL TRAY - 1 Type:  Implant: [id number] - STRYKER BIXCUT REAMERS TRAY - 1 Type:  Implant: [id number] - STRYKER SUPRACONDYLAR ADD-ON TRAY - 1 Type:  Implant: [id number] - SYNTHES PERIARTICULAR SCREWS AND INSTRUMENTS - 1 Type:  Implant: [id number] - SYNTHES LCP LARGE FRAGMENT SCREW TRAY - 1 Type:  Implant: [id number] -  - 1 Type:  Implant: [id number] - [id number] Type: N/A Implant: [id number] - [id number] Type: N/A Implant: M219.99 - [id number] Type: N/A Implant: [id number] - [id number] Type: N/A Implant: F790.304 - [id number] Type: N/A Implant: [id number] - [id number] Type: N/A  COMPLICATIONS:  none  FINDINGS: see op note  DISPOSITION:  awakened from anesthesia, extubated and taken to the recovery room in a stable condition, having suffered no apparent untoward event.  CONDITION: doing well without problems  TECHNIQUE: R distal femur perc screws  - Splints/Brace/Ex-fix: HKB locked at 20 - Drains/wound vacs: none - Surgery(s) complete - Follow-up: 2-3 weeks - PT/OT Current Activity Order (From admission, onward)         Ordered    Activity - Weight Bearing and Restrictions  UNTIL DISCONTINUED       Question Answer Comment  Activity Restrictions Weight Bearing   Activity Restrictions Brace Wear Precautions   Weight bearing status RUE Weight bearing as tolerated   Weight bearing status LUE Weight bearing as tolerated   Weight bearing status RLE Non-weight bearing   Weight bearing status LLE Weight bearing as tolerated   Bracing Wear RLE   Patient to wear RLE brace At all times (specify when to remove)   When to remove RLE brace N/A     07/01/24 1411         Electronically signed by Durene Laymon Norris, MD at 07/01/2024  2:12 PM EDT

## (undated) NOTE — Unmapped External Note (Signed)
 Formatting of this note is different from the original.  Problem: DVT (Risk For) Goal: Demonstrates adequate circulation status as evidenced by the following indicators: Peripheral pulses strong/Peripheral pulses symmetrical/ Peripheral edema not present Outcome: Progressing Toward Goal  Problem: Knowledge Deficit Goal: Patient will verbalize and/or demonstrate understanding of teaching by discharge Outcome: Progressing Toward Goal  Problem: Infection/Isolation (Risk/Actual) Goal: Patient will be free of hospital acquired infection throughout hospitalization Outcome: Progressing Toward Goal  Problem: Pain/Comfort Goal: Adequate pain control while hospitalized Outcome: Progressing Toward Goal  Problem: Fall Risk Goal: Patient will remain as independent as possible Outcome: Progressing Toward Goal  Problem: Skin Alteration (Risk for/Actual) Goal: Patient?s skin integrity will remain intact Outcome: Progressing Toward Goal  Electronically signed by Geraline Senters, RN at 06/28/2024  3:10 AM EDT

## (undated) NOTE — Nursing Note (Signed)
 Formatting of this note might be different from the original. Discharge order received.  Discharged teaching performed. Patient verbalizes that he is aware of their follow-up visit. Report called in to the [facility] SAR. Report given to [place].  Electronically signed by [name] Rife, RN at 07/13/2024  5:31 AM EDT

## (undated) NOTE — Nursing Note (Signed)
 Formatting of this note might be different from the original. Patient is A&Ox4. Patient appeared to be in no signs of distress. Patient has no complaints of pain. AIDET performed. Board updated. Patient bed is in the lowest position with bed alarm turned on. SCD are in use. Plan of care on-going Electronically signed by [name] Rife, RN at 07/12/2024 11:09 PM EDT

## (undated) NOTE — Anesthesia Procedure Notes (Signed)
 Associated Order(s): Airway Formatting of this note might be different from the original. General Information: Airway  Location:OR  Urgency:elective   Intubation Time:07/01/2024 12:59 PM   Performing Provider:Palmer, Norvin Neighbors, CAA   Authorizing Provider:Thomas, Sonny, MD    Indications and Patient Condition Indications for Airway Management:anesthesia and airway protection   Preoxygenated:Yes   Patient Position:sniffing  Mask Difficulty Assessment:easy Final Airway Details Final Airway Type:endotracheal airway   Final Endotracheal Airway:ETT Cuffed:Yes    Cuff Volume:9 mL  Technique Used for Successful ETT Placement:direct laryngoscopy  Devices/Methods Used in Placement:intubating styletRapid sequence induction: no rapid sequence induction Insertion Site:oral Blade Type:MAC Laryngoscope Blade/Videolaryngoscope Blade Size:4 ETT Size:8.12mm  Measured from:gums    ETT to Gums:22 cm  Placement Verified ab:[id number] and EtCO2      Cormack-Lehane Classification:grade I - full view of glottis  Ventilation Between Attempts:none     Electronically signed by Palmer, Antron Jamal, CAA at 07/01/2024  1:49 PM EDT

## (undated) NOTE — Progress Notes (Signed)
 Formatting of this note is different from the original. OCCUPATIONAL THERAPY ORTHO/TRAUMA EVALUATION RN/Staffing recommendations: EOB meals; Delirium Precautions  Location: 5B32/01   Assessment: Bruce Ball is a 29 y.o. male with a PMHx significant for PE (2023 per wife), CKD3a, Hx of prostrate cancer (01/ 2003, Gleason 3+3, 03-10-2002 s/p radical prostatectomy pelvic lymph node disections, no recurrence), lumbosacral radiculopathy, CAD (CAC on CT), PVCs on Coreg , HTN, GERD, vitamin D  deficiency, essential tremor, Hx Sarcoma (~1970, s/p revision right proximal femur replacement in setting of distal periprosthetic femur fx both components 09/2021), osteoporosis (Tx IV reclast yearly), HLD  who presents to [facility] s/p GLF at airport with resultant right periprosthetic distal femoral condyle fracture on 06/27/2024 .  Pt presents during initial OT eval/tx axo4 and follows commands wo difficulty. Pt participation in desired ADLs & mobility limited by generalized weakness, deconditioning and RLE pain. Patient completed supine<>sit CGA, STS min A and BSC tx mod A. OT will continue to follow while inpatient to address above mentioned deficits + maximize pt I/safety.   Discharge Recommendations: Subacute Rehab (patient able to tolerate 1-2 hours of skilled therapy services daily)  Insurance: Payor: [employer] [facility] [employer] / Plan: [employer] / Product Type: *No Product type* /    Equipment Needs: Defer to facility   Referrals: PT/CM/SW  Patient?s Goal: Get moving  Short Term Occupational Therapy Goals: in 4 visit(s), pt will be able to:  Perform hygiene tasks mod I sitting EOB vs sinkside Perform BSC transfer with min A Perform UB dressing I Perform LB dressing min A w AE PRN while adhering to WB precautions  Long Term Occupational Therapy Goals: in 8 visit(s), pt will be able to:  Perform LB dressing mod I w AE PRN Perform BSC tx mod I w RW Perform  standing ADL  Frequency and Duration: 4xwk   Interventions: OT Evaluation, OT Re-Evaluation, Activities of Daily Living, Activities to Increase Function, Therapeutic Exercise, Orthotics/Garment Management & Training, Check out Orthotics/Garment, Perceptual/Cognitive, Neuromuscular Reeducation, Sensory Integration, Manual Therapy, Scar Management ______________________________________________________________________  History this Admission/Hospital Course: Per chart review, Bruce Ball is a 36 y.o. male with PMHx PE (2023 per wife), CKD3a, Hx of prostrate cancer (01/ 2003, Gleason 3+3, 03-10-2002 s/p radical prostatectomy pelvic lymph node disections, no recurrence), lumbosacral radiculopathy, CAD (CAC on CT), PVCs on Coreg , HTN, GERD, vitamin D  deficiency, essential tremor, Hx Sarcoma (~1970, s/p revision right proximal femur replacement in setting of distal periprosthetic femur fx both components 09/2021), osteoporosis (Tx IV reclast yearly), HLD with Frailty score of 0.17 who is [place] GLF at airport with resultant right periprosthetic distal femoral condyle fracture.     Injury/Diagnosis Complex  - R periprosthetic MFC fx  Surgeries - R femur ORIF on 07/01/24 with Dr. Jackquelyn  Precautions:  Falls: Bruce Ball Fall Risk Assessment Scale: 34 (Scoring: 7-10 low fall risk, 11-14 moderate fall risk, 15+ high fall risk)  Bracing/Splinting Needs:  RLE HKB locked at 20 Movement Precautions:  NWB RLE Spine clearance: C/T/L spine cleared by Trauma Tertiary   PMHx/Co-Morbidities:  Past Medical History:  Diagnosis Date   CHF (congestive heart failure) (CMS-HCC)    HTN (hypertension)    Neuropathy    Partial deafness    PE (pulmonary thromboembolism) (CMS-HCC)    Renal disease    Social Hx and Prior Level of Function:  Pt lives w wife in 2 level home Supervision available at discharge? Y, pt wife Stairs/steps to enter: 0 Stairs/steps  inside home: 2 level home Able to stay on main level if  needed Bathroom set-up: walk in w chair Home DME: Rolator, cane, wc Prior level of function: I w ADLs and mod I mobility Rolator for house when doing chores- pt used bench to hold things Cane sometimes Leisure/Employment: not gathered Hand Dominance:  R  SUBJECTIVE: Pt agreeable to participate in OT evaluation.  Pain: denied at rest, but noted w c/o w OOB mobility   OBJECTIVE: The pt is a 72 YO encountered semifowlers w/ bed alarm on on OT arrival. RUE PIV, L SCDs, condom catheter, on floor monitoring.  Communication Ability/Affect/Cognition/Learning Style:  Pt is alert + oriented x 4. Language: English speaking   Cognition:             Command Following: Intact             Attention to task: Intact              Safety/judgment: Intact              Psychosocial Skills/Coping style: Appropriate              Communication: Functional     Hearing: WFL              Insight: Intact   Vision: Pt does  wear glasses at baseline   Integumentary System/General Visual Inspection:  Skin Integrity: ecchymosis to R side of head    Cardiovascular response to activity:   Pt on room air.  Patient dizzy and/or lightheaded upon sitting   Position BP (mm Hg) SpO2 (%) HR (bpm)  Before treatment (Semi-fowlers) 134/64 (83) - 63  Sitting on edge of bed  140/89 (100) - 69   Musculoskeletal System: -based on function Range of Motion:  RUE: AROM WFL for ADLs  LUE:  AROM  WFL for ADLs    Strength:  RUE  LUE  4/5 Grossly 4/5   Balance: Static Sitting Balance: Normal - independent  Dynamic Sitting Balance: Normal - independent   Static Standing Balance: Fair - minimal assist  Balance Scale Reference: Normal: Patient able to maintain steady balance without handhold support; independent balance Good: Patient able to maintain balance without handhold support but limited postural sway requiring supervision Fair: Patient able to maintain balance with handheld support on bed rail or bed  surface; intermittent or constant minimal assistance provided Poor: Patient requires handhold support on bed rail or bed surface and moderate to maximal assistance to maintain posture (From: Daryl KU and Chick MAGNUSON (2007), Physical rehabilitation: assessment and treatment (5th Ed.), [place]: [employer], p.254)   Mobility: Bed Mobility: stand by assist to scoot toward [facility] Supine>Sit: contact guard assist for RLE mgmt Sit>Supine: contact guard assist for RLE mgmt Scooting EOB: stand by assist for lateral scoots to R & L at EOB Fair hip clearance Sit<>Stand: minimum assistance from EOB w RW Fair adherence to mobility precautions Transfers:  transfer to drop arm BSC via lateral scoots  Min A to EOB>commode Mod A commode>EOB  ADLs: Feeding: anticipated modified independent  Grooming: anticipated  modified independent  UB dressing: anticipated  modified independent  LB dressing: anticipated maximum assistance  Bathing: anticipated maximum assistance  Toileting: maximum assistance  Noted w bowel incontinence while at EOB  Special Tests/Outcome Measures: [employer] (TM) 6-clicks Daily Activity Inpatient Short Form   How Much difficulty does the pt currently have..?  Total 1 A Lot 2 A little 3 None 4  1.  Putting on and taking off lower body clothing?  2    2. Bathing (including washing, rinsing, drying)?  2    3. Toileting, which includes using toilet, bedpan, or urinal?  2    4. Putting on and taking off regular upper body clothing?    4  5. Taking care of personal grooming such as brushing teeth?    4  6. Eating Meals?    4   Raw Score:  18/24        AM-PAC  Score Modifier Level of Impairment  6 CN 100%  7-8 CM 80-99%  9-13 CL 60-79%  14-19 CK 40-59%  20-22 CJ 20-39%  23 CI 1-19%  24 CH 0%    97165 LOW Complexity 97166 MODERATE Complexity  97167 HIGH Complexity  OCCUPATIONAL  PROFILE/MEDICAL and THERAPY HISTORY     Brief Review      Expanded Review  x   Extensive Additional Review     ASSESSMENTS     1-3 Performance Deficits     3-5 Performance Deficits  x   5 or More Performance Deficits     CO-MORBIDITIES     No Co-morbidities     Co-Morbidities  x   MODIFICATIONS OR ASSISTANCE     Is not necessary to complete evaluation      Is necessary to complete evaluation  x    Patient Education: Patient verbalizes/demonstrates readiness and needs for therapy program: yes / no Patient prefers verbal/written/visual instructions and shows yes / no barriers to learning. Barriers: Patient understands concepts and needs for therapy program: yes / no  Treatment This Session: OT Moderate Complexity Eval x10, AIF x 30 min  Patient/Caregiver Education Provided: Role/purpose of skilled OT in acute care setting, OT POC,  discharge/DME recommendations, fall prevention strategies, general safety measures, energy conservation techniques, importance of EOB meals. Pt receptive/verbalized understanding to all education provided.   Patient Disposition: Pt left semi-fowlers w/ call light/all other needs within reach, bed alarm on. SBAR to RN on exit.  Time (In/Out): [id number] - 1050  Electronically Signed By: Armin Pinal OTR/L   Electronically signed by Pinal Armin, OT at 07/06/2024  5:55 PM EDT

## (undated) NOTE — Anesthesia Postprocedure Evaluation (Signed)
 Formatting of this note is different from the original. Patient: Bruce Ball  Procedure Summary     Date: 07/01/24 Room / Location: OR 4K6 / [facility] OR   Anesthesia Start: 1248 Anesthesia Stop: 1422   Procedure: ORIF, FRACTURE, FEMUR, DISTAL (Right: Femur) Diagnosis: (R periprosthetic distal femur fx)   Surgeons: Large, Debby Cough, MD Responsible Provider: Debby Raring, MD   Anesthesia Type: general, regional, MAC ASA Status: 3     Anesthesia Type: general, regional, MAC Last vitals BP (!) 171/90 (07/01/24 1421)   Temp 36.6 C (97.9 F) (07/01/24 1421)   Pulse 78 (07/01/24 1421)  Resp 14 (07/01/24 1421)   SpO2 98 % (07/01/24 1421)    TRANSFER OF CARE NOTE: Patient Evaluated:PACU Level of Consciousness:   alert, awake and oriented  Pain Score:   0 Pain Management:   adequate Block Received:No    Duramorph  Used:  No   Other Medications:prophylactic antibiotic administered  PONV outcome:no  Postop Assessment:no apparent anesthetic complications, tolerate procedure well and no evidence of recall    Cardiovascular Status:blood pressure returned to baseline and hemodynamically stable Respiratory Status:Respiratory Status:spontaneous ventilation and face mask      There were no known notable events for this encounter.   Electronically signed by Cammie Norvin Neighbors, CAA at 07/01/2024  2:22 PM EDT

## (undated) NOTE — Progress Notes (Signed)
 Formatting of this note is different from the original.  Physical Therapy Progress Note Mobility recommendation: EOB for meals Location:  5B32/01  History this Admission/Hospital Course: Per chart review: Bruce Ball is a 5 y.o.  male  S/p GLF at airport negative- Head trauma, negativeLOC. Patient noted immediate pain in the Right knee . Unable to bear weight following injury. Denies numbness, tingling or paresthesias in the Right lower extremity. Denies pain or injury to any other joint or extremity   Diagnosis: 60 year old male admitted on 06/27/2024 s/p fall at airport -  R periprosthetic MFC fx  Surgeries - R femur ORIF on 07/01/24 with Dr. Jackquelyn   Precautions: Falls ; Pt is deaf in R ear Weight Bearing Status: NWB RLE Bracing/Splinting Needs: RLE knee immobilizer at all times Movement Precautions/Spine Clearance: C/T/L spines cleared per trauma tertiary 06/30   PMHx/Relevant Co-morbidities:  Past Medical History   Past Medical History:  Diagnosis Date   CHF (congestive heart failure) (CMS-HCC)     HTN (hypertension)     Neuropathy     Partial deafness     PE (pulmonary thromboembolism) (CMS-HCC)     Renal disease      Past Surgical History   Past Surgical History:  Procedure Laterality Date   PR OPEN TX FEMORAL FRACTURE DISTAL MED/LAT CONDYLE Right 07/01/2024    Preliminary Information: Procedure: ORIF, FRACTURE, FEMUR, DISTAL;  Surgeon: Jackquelyn Debby Cough, MD;  Location: [facility] OR;  Service: Orthopedics    S: Patient agreed to participate in PT.     Pain:  Initially, pt denied pain but requested pre-activity pain medication.  At end of session, pt reported 8/10 R knee throbbing and aching. RN notified.  O: Treatment Provided: Functional activity 38 minutes    Communication Ability/Affect/Cognition/Learning Style:  The patient is an elderly Caucasian male of overweight build  encountered in semi-reclined position with wife at bedside. Patient is alert and  oriented x 4. Patient on room air with hematoma to R forehead, multiple bruises on BUE, BUE IV capped, hinged knee brace RLE locked at 20deg, ACE bandage to RLE c/d/I, SCD donned to LLE. Learning/Coping Styles and Overall Behavior Patterns: follows all verbal commands and is cooperative   Session Events: Environment set up for mobility.  Prior to OOB mobility, x5 reps bilateral of quad set w/ improved muscle activation. MinA supine to sit w/ HOB elevated, heavy reliance on bed rails, and therapist supported RLE throughout. MinA forward scooting while therapist supported RLE to reach the ground.  ModA STS w/ RW, max VC/TC for hand placement, BLE positioning, sequencing, and overall safety.  Static standing ~2 minutes. Therapist gave VC/TC for pushing through BUE to flex hip so that RLE is in front of patient. Pt unable to complete. Therapist then cued for patient to attempt to raise L heel off ground to prep for hopping. Pt unable to complete without significant pain.  MinA eccentric control and VC for extending RLE to decrease pain.  Seated therapeutic rest break. Therapist encouraged standing again, but patient declined citing pain and fatigue. MinA sit to supine with therapist supporting RLE.  Pt able to reposition higher in bed by prop sitting on elbows and therapist assisting with drawsheet. Pt then completed x10 reps of bridges with LLE, asymmetrical hip height 2/2 no WB precautions on RLE. SBAR to RN, bed alarm on, RLE elevated with pillows.  Patient left in semi-reclined position, with all needs in reach, bed alarm in place, and 3 bed rails  up on standard hospital bed. Nurse notified of patient's response to therapy and status.   Patient on  room air Pt reports dizziness while sitting EOB after attempting first stand. Denied SOB and lightheadedness. See flow sheet for vitals.  A:  Pt continues to require heavy assist to reach standing position and unable to tolerate static standing >2  minutes. He also has difficulty maintaining weight bearing precautions despite education and max VC. Wife continues to be at bedside as support system. He would benefit from SAR given BLE weakness, overall deconditioning, and difficulty progressing mobility. Will continue to follow while in hospital.       Discharge Recommendations: SAR   Insurance: Payor: [employer] [facility] [employer] / Plan: [employer] / Product Type: *No Product type* /   Equipment Needs: Defer to facility Pt currently owns cane, rollator, WC, electric scooter, shower chair  Referrals: Case Management, C3, Social Worker, Occupational Therapy  Patient?s Goal: Not stated    Short Term Physical Therapy Goals:  in 6 visit(s), patient will be able to:  Pt will complete supine to sit MinA - MET Pt will complete forward scooting MiNA Pt will complete lateral scooting MiNA Pt will complete STS ModA LRAD reaching upright posture.  Pt will maintain static sitting balance for 2 minutes without UE support.  Pt to ambulate 14ft LRAD maintaining WB precautions ModA  Long Term Physical Therapy Goals: in 12 visits, patient will be able to: Pt to ambulate 92ft LRAD w/ WB precautions CGA. Pt to complete supine to sit and bilateral rolling SBA Pt will complete forward and scooting SBA Pt will complete STS LRAD MinA reaching upright posture.   Total PT visits: 3  P: continue 3 x week    Time (In/Out): 8950-8872  Electronically Signed By: Duwaine Kiang, PT, DPT Electronically signed by Kiang Duwaine, PT at 07/09/2024  7:54 AM EDT

## (undated) NOTE — Unmapped External Note (Signed)
 Formatting of this note is different from the original.  Problem: DVT (Risk For) Goal: Demonstrates adequate circulation status as evidenced by the following indicators: Peripheral pulses strong/Peripheral pulses symmetrical/ Peripheral edema not present Outcome: Continue with Current Goal  Problem: Knowledge Deficit Goal: Patient will verbalize and/or demonstrate understanding of teaching by discharge Outcome: Continue with Current Goal Goal: Patient will Participate in learning opportunities offered by staff Outcome: Continue with Current Goal  Problem: Infection/Isolation (Risk/Actual) Goal: Patient will be free of hospital acquired infection throughout hospitalization Outcome: Continue with Current Goal  Problem: Pain/Comfort Goal: Adequate pain control while hospitalized Outcome: Continue with Current Goal  Problem: Fall Risk Goal: Patient will remain as independent as possible Outcome: Continue with Current Goal Goal: Patient will have lower fall risk. Lower injury risk. Outcome: Continue with Current Goal Goal: Patient will remain safe from falls and injury Outcome: Continue with Current Goal Goal: Patient/family will understand fall prevention measures Outcome: Continue with Current Goal Goal: Patient/family will understand injury reduction measures Outcome: Continue with Current Goal Goal: Patient/family will comply with fall program Outcome: Continue with Current Goal Goal: Patient/family will verbalize fall prevention strategies to implement after discharge Outcome: Continue with Current Goal  Problem: Skin Alteration (Risk for/Actual) Goal: Patient?s skin integrity will remain intact Outcome: Continue with Current Goal Goal: Patient will evidence no skin breakdown Outcome: Continue with Current Goal Goal: Patient will regain integrity of skin tissue Outcome: Continue with Current Goal  Problem: Anxiety Goal: The patient's care is consistent with the  individualized perioperative plan of care. Outcome: Continue with Current Goal Goal: The patient's right to privacy is maintained. Outcome: Continue with Current Goal Goal: Patient and family demonstrates knowledge of expected response to operative or invasive procedures Outcome: Continue with Current Goal  Problem: Acute pain Goal: The patient demonstrates knowledge of pain management. Outcome: Continue with Current Goal  Problem: Risk for falls Goal: Patient is free from signs and symptoms of injuries Outcome: Continue with Current Goal  Problem: Nausea Goal: The patient receives prescribed medications and solutions, safely administered during the post-op period Outcome: Continue with Current Goal  Problem: Anxiety Goal: The patient's care is consistent with the individualized perioperative plan of care. Outcome: Continue with Current Goal Goal: The patient's right to privacy is maintained. Outcome: Continue with Current Goal Goal: Patient and family demonstrates knowledge of expected response to operative or invasive procedures Outcome: Continue with Current Goal  Electronically signed by Mardy Lema, RN at 07/06/2024  7:59 PM EDT

## (undated) NOTE — Progress Notes (Signed)
 Formatting of this note is different from the original.  This patient is on [facility] Orthopedic Surgery Internal Medicine Co-Management Team W.  Orthopedic Surgery can be reached at pic [phone] for any questions regarding pain control or the injured/operative extremity.  [facility] Team W can be reached at pic [phone] for any questions regarding vital sign abnormalities, blood sugar, urinary retention, constipation, or any other medical concerns.   In the event of a patient emergency, please page [facility] Team W at pic [phone].  [facility] Team W Orthopedic-Medicine Co-Management Service Daily Progress Note  Patient: Bruce Ball ([id number]) Reason For Admission:Trauma [T14.90XA] Femoral condyle fracture (CMS-HCC) [S72.413A] Femur fracture, right (CMS-HCC) [facility] Hospital Day: LOS: 15 days  Provider: Charice [place], PA-C  Subjective / Interval History  Doing well this am.  No complaints.  Ready for dc.  Objective  Physical Exam (5) 24hour vital signs: Temp:  [36.6 C (97.8 F)-37.1 C (98.8 F)] 36.6 C (97.8 F) Heart Rate:  [59-78] 66 Respirations:  [16-19] 17 Blood Pressure: (141-146)/(64-76) 146/67   Weight: 112.8 kg (248 lb 11.2 oz)   I/O last 3 completed shifts: In: 600 [P.O.:600] Out: 700 [Urine:700]  GEN:   well-appearing man, sitting upright in bed, appears comfortable HEENT:  anicteric sclera, MMM, no conjunctival pallor CV:   normal rate, regular rhythm, no murmurs, no LE edema, extremities wwp PULM:  lungs clear, no wheezes or crackles GI:   soft, nontender, nondistended SKIN:   no jaundice, +hematoma above R eye MSK:   moves all extremities; R knee immobilizer in place NEURO:  alert and oriented, no focal deficits  Medications Scheduled Meds:  apixaban   2.5 mg Oral BID   DULoxetine  DR  60 mg Oral Daily   lidocaine   1 patch Transdermal Q24H   acetaminophen   1,000 mg Oral TID   normal saline  10 mL Intravenous EVERY 8 HOURS   sennosides  2 tablet Oral BID    polyethylene glycol  17 g Oral Daily   moisturizer/plaque preventer  1 each Mouth/Throat BID   gabapentin   300 mg Oral Nightly   AmLODIPine   10 mg Oral Daily   atorvastatin   20 mg Oral Nightly   carvedilol   25 mg Oral Q12H   cholecalciferol   2,000 Units Oral Daily   pantoprazole  DR  40 mg Oral Daily   Continuous Infusions: PRN Meds:oxyCODONE  IR, [DISCONTINUED] traMADol  **OR** oxyCODONE  IR, [COMPLETED] Insert Midline Catheter **AND** Maintain Midline Catheter **AND** normal saline **AND** sodium chloride , [COMPLETED] Insert Peripheral IV **AND** Maintain Peripheral IV **AND** normal saline, [COMPLETED] Insert Peripheral IV **AND** Maintain Peripheral IV **AND** normal saline, ondansetron , naloxone, bisacodyl  **OR** bisacodyl , cetirizine, furosemide , methocarbamol , fluticasone   Labs  CMP: Recent Labs    07/12/24 0056  NA 139  K 4.0  CL 107  CO2 26  BUN 35*  CREATININE 1.2  GLU 105  CALCIUM  8.5*  LABALB 3.4*   CBC and Coags: Recent Labs    07/12/24 0056  WBC 8.5  HGB 13.0*  HCT 39.2*  MCV 85.0  PLT 357   Cardiac Enzymes: No results for input(s): CKTOTAL, CKMB, TROPONINI in the last 72 hours.  Assessment/Plan:  RIYAD Ball is a 61 y.o. male with PMHx PE (2023 per wife), CKD3a, Hx of prostate cancer (01/ 2003, Gleason 3+3, 03-10-2002 s/p radical prostatectomy pelvic lymph node disections, no recurrence), lumbosacral radiculopathy, CAD (CAC on CT), PVCs on Coreg , HTN, GERD, vitamin D  deficiency, essential tremor, Hx Sarcoma (~1970, s/p revision right proximal femur replacement in setting  of distal periprosthetic femur fx both components 09/2021), osteoporosis (Tx IV reclast yearly), HLD who presented 6/29 [place]F at airport, found to have R periprosthetic distal femoral condyle fracture s/p ORIF 7/3. Medically ready for discharge, now awaiting discharge to SAR in [place]  on 7/15.  #Periprosthetic distal femur fx s/p ORIF 7/3 #Hx Sarcoma (~1970, s/p revision  right proximal femur replacement in setting of distal periprosthetic femur fx both components 09/2021) CT Femur R: displaced periprosthetic fracture along the superior medial femoral condyle. Revised R hip prosthesis, now with periprosthetic distal femur fx. Functional status appears fair; frailty index 0.17. No current cardiac symptoms;  - Multimodal pain management and DVT ppx per ortho - senna + miralax  for bowel regimen  #Hx PE (2023) On for prior PE (12/2019). INR 1.4, PT 15.6.  - Resumed home apixaban  2.5mg  BID on 7/7 with permission from orthopedics  #HTN BP 194/94 on admission - Continue home amlodipine  10 mg daily and and carvedilol  25 mg BID   CHRONIC/RESOLVED  #Chest wall pain, resolved No concerning findings on exam - Resolved with lidocaine  patch to chest wall   #CKD2  Cr 1.5 on arrival, now stable in 1.1-1.2 range. - CTM  #CAD #PVC On carvedilol  25 mg BID, well tolerated. No CP, SOB, or recent angina. Low suspicion for active cardiac ischemia. - continue home Coreg   #Osteoporosis On annual Reclast. Recent fracture underscores disease severity. - Ensure adequate calcium  + vit D intake post-op. - Consider DEXA recheck post-recovery  #Hx prostate cancer (01/ 2003, Gleason 3+3, 03-10-2002 s/p radical prostatectomy pelvic lymph node disections, no recurrence) #Lumbosacral radiculopathy #Chronic R Hip Pain Stable. New pain from new fracture.  - continue gabapentin  300 mg nightly - continue duloxetine  60 mg daily - *Condom cath* (not Foley as previously documented) placed perioperatively ~7/3 and maintained due to history of urinary incontinence. Patient wears Depends at home.   #Essential Tremor - continue home BB op  #Incidental findings Multiple predominantly low attenuation masses throughout the liver are mostly subcentimeter with others that range up in size to 3.6 cm x 2.1 cm x 1.7 cm (segment IV). Most are well-defined, but several subcentimeter lesions in  the right lobe are faint and poorly defined. 2. Locally enlarged thyroid  with multiple nodules better detected by concurrent chest CT. Largest nodule measures 1.6 cm and is in the left lobe  Pt to follow up with incidental findings with primary providers in [place]   General:             - Diet/education: Regular diet             - DVT Prophylaxis: apixaban              - Functional Status/Consults: PT/OT             - Lines/Catheters currently in place: PIV; Condom cath (not foley as previously documented)             - Code Status: Full Code  Discharge Planning                                                                    Hospital day: Day 15 of ? (NO DRG)  Planned Discharge Date  7/15  Discharge Location/ Disposition  SAR in [place]   Barriers to meeting TLOS and clinical progression (Why is the patient still in the hospital) Out of state transport scheduled for tomorrow   Charice Sw[place], [facility] Medicine, Team W [facility] Orthopedic Surgery and Internal Medicine Co-Management Service PIC [phone]     Cosigned by Argie Reece Lanes, MD at 07/12/2024  7:37 PM EDT Electronically signed by [place], Charice W., PA-C at 07/12/2024 11:17 AM EDT Electronically signed by Argie Reece Lanes, MD at 07/12/2024  7:37 PM EDT  Associated attestation - Argie Reece Lanes, MD - 07/12/2024  7:37 PM EDT Formatting of this note might be different from the original. I am administratively signing this document.  Reece Argie, MD [facility] Medicine Team W Attending

## (undated) NOTE — Progress Notes (Signed)
 Formatting of this note is different from the original. Orthopaedic Progress Note  Patient Info: Name: Bruce Ball  Admission Date: 06/27/2024  3:16 PM  MRN: [id number] Hospital Day: Day 1 of ? (NO DRG)   Room/Bed: 5B42/02 DOB: [birth year]   Age: 74 y.o.    Interval Update / Subjective  - No acute events overnight.  - No new numbness, tingling, weakness.  - Pain controlled - Denies Nausea, Vomiting, Chest Pain, and SOB  - Possibly pending OR versus nonop pending discussion in morning report, please keep NPO   Objective   Temp:  [36.6 C (97.9 F)-37.1 C (98.8 F)] 36.9 C (98.4 F) Heart Rate:  [60-88] 66 Respirations:  [12-20] 17 Blood Pressure: (153-213)/(78-112) 160/78  Labs: Recent Labs  Lab 06/27/24 1538  NA 144  K 4.5  CL 112*  CO2 24  BUN 20  CREATININE 1.5*  GLU 100   Recent Labs  Lab 06/27/24 1538  WBC 9.8  HGB 15.1  PLT 173   Recent Labs  Lab 06/27/24 1538  INR 1.4*   I/O No intake/output data recorded. I/O this shift: In: 60 [P.O.:60] Out: -   Gen: NAD, AOx3 Pulm: Non-labored breathing Cards: Regular rate and rhythm   RLE KI in place, no wounds or bleeding appreciated, appropriate swelling  Compartments are soft and compressible, appropriately ttp SILT S/Su/SPN/DPN/T  Motor +EHL/FHL/GSC/TA  Foot wwp, 2+DP   Assessment   Bruce Ball is a 49 y.o. male s/p GLF.  Injury/Diagnosis Complex  - R periprosthetic (PFR) medial femoral condyle fracture   Surgeries - pending OR vs nonoperative management   Epic Problem List Active Hospital Problems   Diagnosis Date Noted   Closed nondisplaced fracture of condyle of right femur (CMS-HCC) 06/27/2024   Trauma 06/27/2024   Plan  General  -OOB, IS - Monitor & replete lytes PRN - BR  Acute Pain: - Multimodal analgesia  - Adjust PRN  RLE  - Splints/Brace/Ex-fix: KI  - Drains/wound vacs: None  - Pending further surgeries this admission - Follow-up: TBD  - PT/OT Current Activity  Order (From admission, onward)         Ordered    Activity - Weight Bearing and Restrictions  UNTIL DISCONTINUED       Question Answer Comment  Activity Restrictions Weight Bearing   Weight bearing status RUE Weight bearing as tolerated   Weight bearing status LUE Weight bearing as tolerated   Weight bearing status RLE Non-weight bearing   Weight bearing status LLE Weight bearing as tolerated     06/27/24 2155        PPX - Antibiotics24 Hr Ancef  postop - IP: Lovenox  - at Discharge: ASA 81mg  BID  Discharge Planning  Hospital Day: Day 1 of ? (NO DRG)  - Estimated DC Date: TBD  - Barrier: OR  - DC Location: TBD - PT/OT: Pending Eval - DME: per therapy  Bruce Glendia Lowers, MD  [facility]    PRIMARY Ortho Trauma or Joints - PIC [phone]   ALL NEW CONSULTS - PIC [phone]  New Orthopaedic consult patients or newly found injuries  CURRENT/EXISTING PATIENTS Ortho TRAUMA CONSULTS - PIC [phone]  Ortho SPINE - PIC [phone]  Ortho HAND - PIC [id number]  Podiatry - PIC [phone]  Electronically signed by Ball Bruce Glendia, MD at 06/28/2024  6:09 AM EDT

## (undated) NOTE — Unmapped External Note (Signed)
 Formatting of this note might be different from the original.  Problem: Pain/Comfort Goal: Adequate pain control while hospitalized Outcome: Continue with Current Goal  Problem: Fall Risk Goal: Patient will remain as independent as possible Outcome: Continue with Current Goal Goal: Patient will have lower fall risk. Lower injury risk. Outcome: Continue with Current Goal Goal: Patient will remain safe from falls and injury Outcome: Continue with Current Goal Goal: Patient/family will understand fall prevention measures Outcome: Continue with Current Goal Goal: Patient/family will understand injury reduction measures Outcome: Continue with Current Goal Goal: Patient/family will comply with fall program Outcome: Continue with Current Goal Goal: Patient/family will verbalize fall prevention strategies to implement after discharge Outcome: Continue with Current Goal  Electronically signed by Evertt Grate, RN at 06/30/2024 11:06 PM EDT

## (undated) NOTE — Nursing Note (Signed)
 Formatting of this note might be different from the original. Patient is A&Ox4. Patient appeared to be in no signs of distress. Patient has no complaints of pain. AIDET performed. Board updated. Patient bed is in the lowest position with bed alarm turned on. SCD are in use. Plan of care on-going Electronically signed by [name] Rife, RN at 07/10/2024  8:51 PM EDT

## (undated) NOTE — Unmapped External Note (Signed)
 Formatting of this note might be different from the original.  Problem: Infection/Isolation (Risk/Actual) Goal: Patient will be free of hospital acquired infection throughout hospitalization Outcome: Continue with Current Goal  Problem: Pain/Comfort Goal: Adequate pain control while hospitalized Outcome: Continue with Current Goal  Problem: Fall Risk Goal: Patient will remain as independent as possible Outcome: Continue with Current Goal Goal: Patient will have lower fall risk. Lower injury risk. Outcome: Continue with Current Goal Goal: Patient will remain safe from falls and injury Outcome: Continue with Current Goal Goal: Patient/family will understand fall prevention measures Outcome: Continue with Current Goal Goal: Patient/family will understand injury reduction measures Outcome: Continue with Current Goal Goal: Patient/family will comply with fall program Outcome: Continue with Current Goal Goal: Patient/family will verbalize fall prevention strategies to implement after discharge Outcome: Continue with Current Goal  Electronically signed by Nguyen, Lien, RN at 07/04/2024  7:35 AM EDT

## (undated) NOTE — Progress Notes (Signed)
 Formatting of this note is different from the original. Orthopaedic Progress Note  Patient Info: Name: Bruce Ball  Admission Date: 06/27/2024  3:16 PM  MRN: [id number] Hospital Day: Day 49 of ? (NO DRG)   Room/Bed: 5B32/01 DOB: 01-28-1946   Age: 16 y.o.    Interval Update / Subjective  - No acute events overnight.  - No new numbness, tingling, weakness.  - Pain controlled  - Denies Nausea, Vomiting, Diarrhea, Chest pain, and SOB - Dispo pending SAR placement with external facility in [place]. Wife at [facility] and agrees on dispo recommendation.  Objective   Temp:  [36.6 C (97.9 F)-37.2 C (98.9 F)] 36.6 C (97.9 F) Heart Rate:  [68-80] 70 Respirations:  [18] 18 Blood Pressure: (128-139)/(53-76) 129/63  Labs: Recent Labs  Lab 07/03/24 0627 07/05/24 2345  NA 141 141  K 3.8 3.9  CL 105 105  CO2 28 27  BUN 26* 29*  CREATININE 1.2 1.1  GLU 102 99   Recent Labs  Lab 07/03/24 0627 07/05/24 2345  WBC 9.6 8.4  HGB 13.4 12.5*  PLT 199 215   No results for input(s): INR in the last 168 hours.  I/O I/O last 3 completed shifts: In: 654 [P.O.:654] Out: 1300 [Urine:1300] No intake/output data recorded.  Gen: NAD, AOx3 Pulm: Non-labored breathing Cards: Regular rate and rhythm   RLE: Dressings c/d/I, HKB @ 20 degrees SILT sp/dp/t/sa/su Motor +ehl/fhl/ta/gsc Compartments soft, compressible Toes wwp, BCR x5 2+ DP pulse  Assessment   Bruce Ball is a 2 y.o. male s/p GLF.  Injury/Diagnosis Complex  - R periprosthetic MFC fx  Surgeries - R femur ORIF on 07/01/24 with Dr. Jackquelyn Salines Problem List Active Hospital Problems   Diagnosis Date Noted   DVT (deep venous thrombosis) (CMS-HCC) 06/28/2024   HTN (hypertension), benign 06/28/2024   Acid reflux 06/28/2024   CHF (congestive heart failure) (CMS-HCC) 06/28/2024   Renal disease 06/28/2024   Closed displaced fracture of condyle of right femur (CMS-HCC) 06/27/2024   Trauma 06/27/2024   Plan  General   -IS - Monitor & replete lytes PRN - BR  Acute Pain - Multimodal analgesia  - Adjust PRN  RLE - Splints/Brace/Ex-fix: HKB locked at 20  - Drains/wound vacs: None - Surgery(s) complete - Follow-up: 7/22 with TL or at home in [facility] - PT/OT  Current Activity Order (From admission, onward)         Ordered    Activity - Weight Bearing and Restrictions  UNTIL DISCONTINUED       Question Answer Comment  Activity Restrictions Weight Bearing   Activity Restrictions Brace Wear Precautions   Weight bearing status RUE Weight bearing as tolerated   Weight bearing status LUE Weight bearing as tolerated   Weight bearing status RLE Non-weight bearing   Weight bearing status LLE Weight bearing as tolerated   Bracing Wear RLE   Patient to wear RLE brace At all times (specify when to remove) HKB locked @ 20  When to remove RLE brace N/A     07/07/24 1045        PPX - Antibiotics: 24 Hr Periop Ancef  - IP: home Eliquis   - at Discharge: home Eliquis  dose (2.5mg )   Remainder of care per Co-Management Team W  This patient is on [facility] Orthopedic Surgery Internal Medicine Co-Management Team W.  Orthopedic Surgery can be reached at pic [phone] for any questions regarding pain control or the injured/operative extremity. [employer] W can be reached at [employer]  [phone] for any questions regarding vital sign abnormalities, blood sugar, urinary retention, constipation, or any other medical concerns. In the event of a patient emergency, please page [facility] Team W at pic [phone].  Discharge Planning - Medically cleared for DC - Barrier: SAR - DC Location: SAR in  - PT/OT: Clearance not needed   Zetta Sherle RIGGERS, MPH  [facility]    PRIMARY Ortho Trauma or Joints - PIC [phone]   ALL NEW CONSULTS - PIC [phone]  New Orthopaedic consult patients or newly found injuries  CURRENT/EXISTING PATIENTS   Ortho TRAUMA CONSULTS - PIC [phone]    Ortho SPINE - PIC [phone]    Ortho HAND - PIC [id number]     Podiatry - PIC [phone]    Electronically signed by Sherle Zetta, PA-C at 07/08/2024 11:58 AM EDT

## (undated) NOTE — Unmapped External Note (Signed)
 Formatting of this note might be different from the original.  Problem: Anxiety Goal: The patient's care is consistent with the individualized perioperative plan of care. Outcome: Progressing Toward Goal Goal: The patient's right to privacy is maintained. Outcome: Progressing Toward Goal Goal: Patient and family demonstrates knowledge of expected response to operative or invasive procedures Outcome: Progressing Toward Goal  Electronically signed by Luella Curlee BIRCH, RN at 06/30/2024  9:36 AM EDT

## (undated) NOTE — Progress Notes (Signed)
 Formatting of this note might be different from the original. Occupational Therapy Contact Note  Consult appreciated and chart reviewed. Per chart, patient pending femur fracture management per orthopedics. Will continue to monitor chart and re-attempt when the patient is available/appropriate.   Thank you, [place] Bennie, OTD, OTR/L Occupational Therapist  Electronically signed by Bennie, [place], OT at 06/28/2024  7:48 AM EDT

## (undated) NOTE — Progress Notes (Signed)
 Formatting of this note might be different from the original. Report received. Board updated. Patient is alert and oriented x 4. Call light within reach. All safety precautions in place. No distress noted. Plan of care ongoing.  Electronically signed by Janifer Confer, RN at 07/06/2024 12:22 PM EDT

## (undated) NOTE — Progress Notes (Signed)
 Formatting of this note might be different from the original.  Care Management Progress Note  1. Recommendation: SAR  2. Interventions: Other: MSW called Niki [phone]  with [employer] to follow up on auth initiation. Britney stated she would start auth once we hang up.    MSW called [facility] to inquire about out-of-state transport. MSW spoke to Jasmine, a transportation lead and she stated that normally the pt will have to pay upfront. MSW asked if a quote could be provided. Jasmine stated MSW could arrange future transport and that she would work on getting a quote to provide to pt and family.   MSW arranged transport for 7/14, pending insurance auth comes back.   3. Barriers: Other: Auth pending   4. Outcomes: Other: Ongoing dc planning   5. Next Steps: Follow up and plan for a safe dc   6. Expected Discharge Date: 7/14  Update 2:17 pm: MSW received a quote for transportation from [employer] with transportation. MSW shared this with pt's wife. MSW will follow up with pt's wife regarding payment.   Update 4:22pm - MSW received a call from [place] with [employer] stating insurance is requesting updated notes by noon tomorrow. MSW notified therapy team.   Luise Creed, MSW  [phone] Electronically signed by Creed Luise, MSW at 07/08/2024 11:51 AM EDT Electronically signed by Creed Luise, MSW at 07/08/2024  2:18 PM EDT Electronically signed by Creed Luise, MSW at 07/08/2024  4:23 PM EDT

## (undated) NOTE — Progress Notes (Signed)
 Formatting of this note might be different from the original.  Care Management Progress Note  1. Recommendation: Other Transfer to [facility]  2. Interventions: Other: Snap huddle/ Chart Review. Notified via MD that patient would like to Transfer to [facility]. MD already spoke with transfer center and sent imaging. Awaiting on acceptance.  3. Barriers: Other: Transfer to [facility]  4. Outcomes: Other: Remains inpatient  5. Next Steps: C3 to follow up on Transfer to [facility]   6. Expected Discharge Date:  06/30/2024   Cooper Seeds, CM,RN  Electronically signed by Seeds Cooper, CM,RN at 06/28/2024  4:52 PM EDT

## (undated) NOTE — Progress Notes (Signed)
 Formatting of this note might be different from the original. Physical Therapy  Consult received, chart reviewed. Pt pending OR vs transfer to hospital in [place] . Will monitor chart and follow up post operatively.   Electronically signed by: Duard Penta, PT    Electronically signed by Penta Duard, PT at 06/30/2024  8:55 AM EDT

## (undated) NOTE — Unmapped External Note (Signed)
 Formatting of this note might be different from the original.  Problem: Infection/Isolation (Risk/Actual) Goal: Patient will be free of hospital acquired infection throughout hospitalization Outcome: Continue with Current Goal  Problem: Pain/Comfort Goal: Adequate pain control while hospitalized Outcome: Continue with Current Goal  Problem: Fall Risk Goal: Patient will remain as independent as possible Outcome: Continue with Current Goal Goal: Patient will have lower fall risk. Lower injury risk. Outcome: Continue with Current Goal Goal: Patient will remain safe from falls and injury Outcome: Continue with Current Goal Goal: Patient/family will understand fall prevention measures Outcome: Continue with Current Goal Goal: Patient/family will understand injury reduction measures Outcome: Continue with Current Goal Goal: Patient/family will comply with fall program Outcome: Continue with Current Goal Goal: Patient/family will verbalize fall prevention strategies to implement after discharge Outcome: Continue with Current Goal  Electronically signed by Nguyen, Lien, RN at 07/03/2024  9:00 AM EDT

## (undated) NOTE — Progress Notes (Signed)
 Formatting of this note might be different from the original. Report received. Board updated. Patient is resting comfortably in bed. Call light within reach. All safety precautions in place. No distress noted. Plan of care ongoing.  Electronically signed by Janifer Confer, RN at 07/07/2024  7:55 AM EDT

## (undated) NOTE — Consults (Signed)
 Formatting of this note is different from the original. This patient is on [facility] Orthopedic Surgery Internal Medicine Co-Management Team W.  Orthopedic Surgery can be reached at pic [phone] for any questions regarding pain control or the injured/operative extremity. [facility] Team W can be reached at pic [phone] for any questions regarding vital sign abnormalities, blood sugar, urinary retention, constipation, or any other medical concerns.  In the event of a patient emergency, please page [facility] Team W at pic [phone].  Service: [facility] Medicine Team W - Ortho Medicine Co-Management Team  Attending: Lonni Baker, MD  Name: ZACHARI ALBERTA MRN:   [id number]  Referring Physician: Anthony Karzon, MD  Reason for Consult: Medicine consulted for medication management and preoperative risk stratification.   HPI:  Bruce Ball is a 39M with PMHx PE (12/2019), CKD3a, Hx of prostrate cancer (01/ 2003, Gleason 3+3, 03-10-2002 s/p radical prostatectomy pelvic lymph node disections, no recurrence), lumbosacral radiculopathy, CAD (CAC on CT), PVCs on Coreg , HTN, GERD, vitamin D  deficiency, essential tremor, Hx Sarcoma (~1970, s/p revision right proximal femur replacement in setting of distal periprosthetic femur fx both components 09/2021), osteoporosis (Tx IV reclast yearly), HLD with Frailty score of 0.17 who [place] with [place] right periprosthetic distal femoral condyle fracture. Medicine consulted for medication management and preoperative risk stratification. Denies LOC or head trauma. Noted immediate R knee pain post-fall and inability to bear weight. No numbness, tingling, or paresthesia in RLE. Denies other trauma. States he takes his medications daily. No current chest pain, dyspnea, dizziness, lightheadedness, presyncope, or vision changes.   PmHx:  Past Medical History:  Diagnosis Date   CHF (congestive heart failure) (CMS-HCC)    HTN (hypertension)    Neuropathy    Partial  deafness    PE (pulmonary thromboembolism) (CMS-HCC)    Renal disease    PsHx:  Home medications:  Current Outpatient Medications  Medication Instructions   AmLODIPine  (NORVASC ) 10 mg, Oral, DAILY   apixaban  (ELIQUIS ) 2.5 mg, 2 TIMES DAILY   atorvastatin  (LIPITOR) 20 mg, NIGHTLY   carvedilol  (COREG ) 25 mg, EVERY 12 HOURS   cetirizine (ZYRTEC) 10 mg, Oral, PRN   furosemide  (LASIX ) 20 mg, PRN   HYDROmorphone  (DILAUDID ) 2 mg, EVERY 12 HOURS PRN   methocarbamol  (ROBAXIN ) 500 mg, 4 TIMES DAILY PRN   pantoprazole  DR (PROTONIX ) 40 mg, DAILY   Vitamin D3 2,000 Units, Oral, DAILY   FmHx:  family history is not on file.  SoHx:  Occupation: Retired Airline pilot  Tobacco Use  Smoking status: Former  Current packs/day: 0.00  Average packs/day: 1 pack/day for 20.0 years (20.0 ttl pk-yrs)  Types: Cigarettes  Start date: 1963  Quit date: 1980  Years since quitting: 45.1  Smokeless tobacco: Never  Vaping Use  Vaping status: Never Used  Substance and Sexual Activity  Alcohol use: No  Drug use: Never   Allergies:  No Known Allergies  Review of Systems:  10 point ROS performed and is negative except for what is mentioned in the HPI.  Physical Exam:   BP (!) 194/94   Pulse 82   Temp 36.6 C (97.9 F)   Resp 18   Ht 1.803 m (5' 11)   Wt 113.4 kg (250 lb)   SpO2 96%   BMI 34.87 kg/m  Weight: 113.4 kg (250 lb)  GEN:  NAD, A+Ox3 HEENT:  EOMI, PERRL, moist mucus membranes CV:  RRR, no m/r/g, no LE edema, no JVD PULM:  CTAB, no crackles, no wheezes ABD:  Soft, NT, ND, NABS, no organomegaly NEURO:  CN2-12 intact,  MSK:  1+ pedal edema. R knee immobilizer in place.  SKIN:  intact without gross abnormalities  Labs/Studies:   CMP:  Lab Results  Component Value Date   NA 144 06/27/2024   K 4.5 06/27/2024   CL 112 (H) 06/27/2024   CO2 24 06/27/2024   BUN 20 06/27/2024   CREATININE 1.5 (H) 06/27/2024   GLU 100 06/27/2024   PROT 5.8 (L) 06/27/2024   LABALB 3.7 06/27/2024    BILITOT 1.0 06/27/2024   BILIDIR 0.2 06/27/2024   ALT 17 06/27/2024   AST 23 06/27/2024   ALKPHOS 63 06/27/2024   CBC and Coags:  Lab Results  Component Value Date   WBC 9.8 06/27/2024   HGB 15.1 06/27/2024   HCT 46.9 06/27/2024   MCV 87.0 06/27/2024   PLT 173 06/27/2024   PT 15.6 (H) 06/27/2024   INR 1.4 (H) 06/27/2024   Other Imaging: as below  Assessment/Plan: Flynt Breeze is a 37M with PMHx PE (12/2019), CKD3a, Hx of prostrate cancer (01/ 2003, Gleason 3+3, 03-10-2002 s/p radical prostatectomy pelvic lymph node disections, no recurrence), lumbosacral radiculopathy, CAD (CAC on CT), PVCs on Coreg , HTN, GERD, vitamin D  deficiency, essential tremor, Hx Sarcoma (~1970, s/p revision right proximal femur replacement in setting of distal periprosthetic femur fx both components 09/2021), osteoporosis (Tx IV reclast yearly), HLD with Frailty score of 0.17 who [place] airport with resultant right periprosthetic distal femoral condyle fracture. Medicine consulted for medication management and preoperative risk stratification.   #Pre-op Risk Stratification & Medical Optimization #Periprosthetic distal femur fx #Hx Sarcoma (~1970, s/p revision right proximal femur replacement in setting of distal periprosthetic femur fx both components 09/2021) #Pain Management CT Femur R: displaced periprosthetic fracture along the superior medial femoral condyle. Revised R hip prosthesis, now with periprosthetic distal femur fx. Functional status appears fair; frailty index 0.17. No current cardiac symptoms;  - surgical management per ortho - RCRI = 1 (Hx CAD) so low risk for proposed surgery - cont on HYDROmorphone  PRN + methocarbamol  PRN. - minimizing opioids pre-op given fall risk and age - Tylenol  1g TID PRN - senna + miralax  for bowel regimen  #Hx PE On for prior PE (12/2019). INR 1.4, PT 15.6.  - hold apixaban  pre-op; coordinate timing with anesthesia and ortho. No bridging indicated if  low to moderate risk. - Resume post-op when hemostasis secure. No recent VTE.  #HTN BP 194/94 on admission; patient on amlodipine  10 mg daily and and carvedilol  25 mg BID  - holding both pre-op; recheck BP prior to OR. - Resume post-op as BP tolerates  #CKD3a (Cr 1.5, baseline per chart) Labs otherwise stable (K 4.5, CO2 24, BUN 20) - Avoid nephrotoxins (NSAIDs, contrast) - Ensure adequate volume status pre/post-op  #CAD #PVC On carvedilol  25 mg BID, well tolerated. No CP, SOB, or recent angina. Low suspicion for active cardiac ischemia. - No further cardiac work-up indicated unless new symptoms emerge. - Holding carvedilol  25 mg BID, continue post-op  #Osteoporosis On annual Reclast. Recent fracture underscores disease severity. - Ensure adequate calcium  + vit D intake post-op. - Consider DEXA recheck post-recovery  #Hx prostrate cancer (01/ 2003, Gleason 3+3, 03-10-2002 s/p radical prostatectomy pelvic lymph node disections, no recurrence) #Lumbosacral radiculopathy #Chronic R Hip Pain Stable. New pain from new fracture.  - continue gabapentin  300 mg nightly - continue duloxetine  30 mg daily  #Essential Tremor - continue home BB post-op  #GERD -  continue PPI daily   #Vitamin D  Deficiency - cont 2k units vit d daily  #HLD - continue atorvastatin  20 mg daily  General:   - Diet: NPO   - DVT Prophylaxis: Lovenox   - Line/Foley Status: PIV  - Code Status: Full [confirmed with patient and wife at bedside on 06/28/24]  Discharge Planning                                                                    Hospital day: Day 1 of ? (NO DRG)  Planned Discharge Date  TBD  Discharge Location/ Disposition  TBD  Barriers to meeting TLOS and clinical progression (Why is the patient still in the hospital) Surgery   This patient is on [facility] Orthopedic Surgery Internal Medicine Co-Management Team W.  Orthopedic Surgery can be reached at pic [phone] for any questions regarding pain  control or the injured/operative extremity. [facility] Team W can be reached at pic [phone] for any questions regarding vital sign abnormalities, blood sugar, urinary retention, constipation, or any other medical concerns.  In the event of a patient emergency, please page [facility] Team W at pic [phone].  Tera Dolly, MD [facility] Medicine Team W [facility] Orthopedic Internal Medicine Co-Management Team PIC: [phone]  06/27/2024 10:22 PM EDT Cosigned by Claudene Almarie Lamprey, MD at 06/28/2024  5:32 PM EDT Electronically signed by Dolly Tera, MD at 06/28/2024 12:51 AM EDT Electronically signed by Claudene Almarie Lamprey, MD at 06/28/2024  5:32 PM EDT  Associated attestation - Claudene Almarie Lamprey, MD - 06/28/2024  5:32 PM EDT Formatting of this note might be different from the original. I have seen and examined the pt and agree with Dr. Edythe assessment and plan, unless otherwise noted.  06/28/24  78M with PMHx PE (12/2019), CKD3a, Hx of prostrate cancer (01/ 2003, Gleason 3+3, 03-10-2002 s/p radical prostatectomy pelvic lymph node disections, no recurrence), lumbosacral radiculopathy, CAD (CAC on CT), PVCs on Coreg , HTN, GERD, vitamin D  deficiency, essential tremor, Hx Sarcoma (~1970, s/p revision right proximal femur replacement in setting of distal periprosthetic femur fx both components 09/2021), osteoporosis (Tx IV reclast yearly), HLD with Frailty score of 0.17 who [place] airport with resultant right periprosthetic distal femoral condyle fracture. Patient denies any LOC, CP, SOB, dizziness. Wife is at bedside this morning as well. States they were discussing plan with ortho team regarding surgical intervention here vs back [place])  #Pre-op Risk Stratification & Medical Optimization #Periprosthetic distal femur fx #Hx Sarcoma (~1970, s/p revision right proximal femur replacement in setting of distal periprosthetic femur fx both components 09/2021) #Pain  Management CT Femur R: displaced periprosthetic fracture along the superior medial femoral condyle. Revised R hip prosthesis, now with periprosthetic distal femur fx. Functional status appears fair; frailty index 0.17. No current cardiac symptoms;  - surgical management per ortho - RCRI = 1 (Hx CAD) so low risk for proposed surgery - cont on HYDROmorphone  PRN + methocarbamol  PRN. - minimizing opioids pre-op given fall risk and age - Tylenol  1g TID PRN - senna + miralax  for bowel regimen No medical reason that patient could not travel to [place] if cleared by ortho team to do such as well  #Hx PE On for prior PE (12/2019).  INR 1.4, PT 15.6.  - hold apixaban  pre-op; coordinate timing with anesthesia and ortho. No bridging indicated if low to moderate risk. - Resume post-op when hemostasis secure. No recent VTE.  #HTN BP 194/94 on admission; patient on amlodipine  10 mg daily and and carvedilol  25 mg BID  - Resume amlodipine  and coreg   #CKD3a (Cr 1.5, baseline per chart) Labs otherwise stable (K 4.5, CO2 24, BUN 20) - Avoid nephrotoxins (NSAIDs, contrast) - Ensure adequate volume status pre/post-op  #CAD #PVC On carvedilol  25 mg BID, well tolerated. No CP, SOB, or recent angina. Low suspicion for active cardiac ischemia. - No further cardiac work-up indicated unless new symptoms emerge. - Coreg  held on admission, however, no need to hold prei-op BB in pt who is on long standing, stable dose -Continue Coreg   #Osteoporosis On annual Reclast. Recent fracture underscores disease severity. - Ensure adequate calcium  + vit D intake post-op. - Consider DEXA recheck post-recovery  #Hx prostrate cancer (01/ 2003, Gleason 3+3, 03-10-2002 s/p radical prostatectomy pelvic lymph node disections, no recurrence) #Lumbosacral radiculopathy #Chronic R Hip Pain Stable. New pain from new fracture.  - continue gabapentin  300 mg nightly - continue duloxetine  30 mg daily  #Essential Tremor -  continue home BB post-op  #GERD - continue PPI daily   #Vitamin D  Deficiency - cont 2k units vit d daily  #HLD - continue atorvastatin  20 mg daily  Almarie Dibbles, MD Attending Team W [facility] [phone]

## (undated) NOTE — Progress Notes (Signed)
 Formatting of this note is different from the original. Orthopaedic Progress Note  Patient Info: Name: Bruce Ball  Admission Date: 06/27/2024  3:16 PM  MRN: [id number] Hospital Day: Day 82 of ? (NO DRG)   Room/Bed: 5B32/01 DOB: [birth year]   Age: 63 y.o.    Interval Update / Subjective  - No acute events overnight.  - No new numbness, tingling, weakness.  - Pain controlled  - Denies Nausea, Vomiting, Diarrhea, Chest pain, and SOB - Med W following for co management. - Dispo pending SAR placement with external facility in [place].   - Per CM, out of state transportation currently setup for 7/14.  - Facility requesting updated therapy notes.  Objective   Temp:  [36.4 C (97.6 F)-36.9 C (98.4 F)] 36.4 C (97.6 F) Heart Rate:  [65-79] 72 Respirations:  [18] 18 Blood Pressure: (116-149)/(48-72) 128/66  Labs: Recent Labs  Lab 07/05/24 2345 07/08/24 1100  NA 141 141  K 3.9 4.6  CL 105 105  CO2 27 28  BUN 29* 32*  CREATININE 1.1 1.2  GLU 99 125   Recent Labs  Lab 07/05/24 2345 07/08/24 1100  WBC 8.4 7.2  HGB 12.5* 12.8*  PLT 215 290   No results for input(s): INR in the last 168 hours.  I/O I/O last 3 completed shifts: In: 650 [P.O.:650] Out: 1200 [Urine:1200] No intake/output data recorded.  Gen: NAD, AOx3 Pulm: Non-labored breathing Cards: Regular rate and rhythm   RLE: Dressings c/d/I, HKB @ 20 degrees SILT sp/dp/t/sa/su Motor +ehl/fhl/ta/gsc Compartments soft, compressible Toes wwp, BCR x5 2+ DP pulse  Assessment   Bruce Ball is a 75 y.o. male s/p GLF.  Injury/Diagnosis Complex  - R periprosthetic MFC fx  Surgeries - R femur ORIF on 07/01/24 with Dr. Jackquelyn Salines Problem List Active Hospital Problems   Diagnosis Date Noted   DVT (deep venous thrombosis) (CMS-HCC) 06/28/2024   HTN (hypertension), benign 06/28/2024   Acid reflux 06/28/2024   CHF (congestive heart failure) (CMS-HCC) 06/28/2024   Renal disease 06/28/2024   Closed  displaced fracture of condyle of right femur (CMS-HCC) 06/27/2024   Trauma 06/27/2024   Plan  General  -IS - Monitor & replete lytes PRN - BR  Acute Pain - Multimodal analgesia  - Adjust PRN  RLE - Splints/Brace/Ex-fix: HKB locked at 20  - Drains/wound vacs: None - Surgery(s) complete - Follow-up: 7/22 with TL or at home in [facility] - PT/OT  Current Activity Order (From admission, onward)         Ordered    Activity - Weight Bearing and Restrictions  UNTIL DISCONTINUED       Question Answer Comment  Activity Restrictions Weight Bearing   Activity Restrictions Brace Wear Precautions   Weight bearing status RUE Weight bearing as tolerated   Weight bearing status LUE Weight bearing as tolerated   Weight bearing status RLE Non-weight bearing   Weight bearing status LLE Weight bearing as tolerated   Bracing Wear RLE   Patient to wear RLE brace At all times (specify when to remove) HKB locked @ 20  When to remove RLE brace N/A     07/07/24 1045        PPX - Antibiotics: 24 Hr Periop Ancef  - IP: home Eliquis   - at Discharge: home Eliquis  dose (2.5mg )   Remainder of care per Co-Management Team W  This patient is on [facility] Orthopedic Surgery Internal Medicine Co-Management Team W.  Orthopedic Surgery can be reached at pic  [phone] for any questions regarding pain control or the injured/operative extremity. [facility] Team W can be reached at pic [phone] for any questions regarding vital sign abnormalities, blood sugar, urinary retention, constipation, or any other medical concerns. In the event of a patient emergency, please page [facility] Team W at pic [phone].  Discharge Planning - Medically cleared for DC - Barrier: SAR - DC Location: SAR in [place] - PT/OT: Clearance not needed  - DME: per facility  Arlyne HERO. [facility]    PRIMARY Ortho Trauma or Joints - PIC [phone]   ALL NEW CONSULTS - PIC [phone]  New Orthopaedic consult patients or newly found  injuries  CURRENT/EXISTING PATIENTS Ortho TRAUMA CONSULTS - PIC [phone]  Ortho SPINE - PIC [phone]  Ortho HAND - PIC [phone]  Podiatry - PIC [phone]    Electronically signed by Janit Arlyne HERO, PA-C at 07/09/2024 10:49 AM EDT

## (undated) NOTE — Unmapped External Note (Signed)
 Formatting of this note might be different from the original.                             [facility]                              [place], [place]                                OPERATIVE REPORT  MRN:    [id number]      PATIENT:  Bruce Ball, Bruce Ball SEX:    M  DOB:  [birth year]   AGE: 80  PT TYPE: I SERVICE:  ORTHOPEDICS      DATE OF PROCEDURE:  07/01/2024 ADMISSION DATE:  06/27/2024   ACCOUNT NUMBER: [id number] * * * * * * * * * * * * * * * * * * * * * * * * * * * * * * * *  DICTATED BY:  [id number] Bruce Ball, M.D.  DATE OF PROCEDURE:  07/01/2024  PREOPERATIVE DIAGNOSIS:  Right supracondylar intercondylar distal femur periprosthetic fracture.  POSTOPERATIVE DIAGNOSIS:  Right supracondylar intercondylar distal femur periprosthetic fracture.  OPERATIVE PROCEDURE:  Open reduction, internal fixation right medial femoral condyle component of supracondylar and intercondylar distal femur fracture.  ATTENDING SURGEON:  Bruce Dearth, MD.  RESIDENT SURGEON:  Dr. Therisa Ball.  FELLOWSHIP SURGEON:  Dr. Laymon Ball.  ANESTHESIA:  General.  ESTIMATED BLOOD LOSS:  25 cc.  FLUIDS:  Crystalloids.  DRAINS:  None.  SPECIMENS:  None.  COMPLICATIONS:  None.  IMPLANTS:  Synthes 6.5 and 7.3 mm screws.  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  He was traveling through [place] when he fell at the airport sustaining a fracture of his distal femur.  Fortunately, the fracture was not significantly displaced, but did have components around the end of his prosthesis in the supracondylar region with the primary fracture line going intraarticular, primarily involving the medial femoral condyle.  Given the complexity of his injury with his prior proximal femoral replacement, his treating physicians at [facility] were contacted.  [name] reviewed his imaging and recommended  treatment with screw fixation for the intraarticular component of the fracture, nonoperative management of the supracondylar periprosthetic component.  There was no way to fix this fracture to his shaft and he would otherwise require conversion to a complete femur replacement.  I agreed with the recommendations from [name].  Risks, benefits, alternatives were discussed with the patient and his family by myself or 1 of my colleagues in detail, all questions were answered, and informed consent was obtained for the above-listed procedure. The family understood that I was essentially operating the recommendations from his treating femoral oncologic surgeon as transfer to [facility] was not possible as this was the patient's wishes initially.  TECHNIQUE:  The patient was brought to the operating room where general anesthesia was induced without complication.  Right lower extremity was washed with chlorhexidine  and alcohol, followed by standard sterile preparation and draping with ChloraPrep.  Preoperative antibiotics were administered.  Surgical timeout was performed with the operative team. Fluoroscopy was used and interpreted by myself throughout the case. Preoperative planning was performed off his x-rays and CT scan.  Under fluoroscopy,  the pathway and trajectory for the lag screws for the intraarticular medial femoral condyle component of his fracture are planned and percutaneous incisions were made from the lateral side.  Guidewires were advanced under fluoroscopy across the femoral condyles parallel to the knee joint with appropriate spread between the screws.  Length of the screws was measured.  The 1st screw was a 6.5 mm partially threaded screw with a washer.  The guidewire was advanced out the medial side and the screw was placed from medial to lateral with a washer with good purchase obtained.  The 2nd screw was to go from the lateral side.  This was initially a  partially-threaded 6.5 screw as well.  This did not have good purchase, so I converted it to a 7.3 partially threaded screw, which also did not have good purchase, therefore I advanced the guidewire out the medial side and converted to a fully-threaded 7.3 screw from the medial side with a washer which did have good purchase to complete the construct. Final fluoroscopic imaging showed safe and appropriate hardware position, appropriate fracture reductions.  There was no angular deformity through the supracondylar component of this periprosthetic fracture on AP and lateral imaging.  The incisions were thoroughly irrigated, closed with 2-0 Vicryl and staples and sterile dressings were applied.  The patient was placed into a hinged knee brace locked at 20 degrees of flexion to minimize his risk of weightbearing and displacing the supracondylar periprosthetic distal femoral component of this fracture which is being treated nonoperatively in the knee brace.  A 50/50 mix of 1% plain lidocaine  and 0.5% plain bupivacaine was mixed on the back table and 25 cc of this 50/50 mixture of the local anesthetic was injected equally between his 4 incisions prior to closure.  The patient tolerated the procedure well without complication.  He is strictly nonweightbearing on the right lower extremity.  He will be focusing on bed-to-chair transfers initially with therapy.  Range of motion and weightbearing will be started at the discretion of his oncology surgeon.  I would anticipate about a 3 month period of nonweightbearing. __________ complex discharge Case Management planning for decisions about staying in the local [facility] area for his postoperative rehab or skilled nursing facility stay versus transport back to [place]  for ongoing rehab or skilled nursing facility stay.  He will be on routine postoperative antibiotic, chemical thromboembolic prophylaxis.  Appreciate the care of the consulting  Internal Medicine team as well.  He will follow up with myself or his [facility], [name], 2-3 weeks for a wound check and staple removal depending on whether he stays in the [facility] area or goes to [place] .  [id number]  Bruce Ball, M.D.                    or  [id number]   Bruce Ball, M.D. ATTENDING  Dictation Date:  07/01/2024 Date Transcribed:  07/01/2024 09:33 P//etp  cc: Electronically signed by Ball Bruce Cough, MD at 07/02/2024  8:41 PM EDT

## (undated) NOTE — Unmapped External Note (Signed)
 Formatting of this note might be different from the original.  Problem: Anxiety Description: Patient demonstrates anxiety related to lack of knowledge about the procedure, lack of privacy and coping mechanisms. Goal: The patient's care is consistent with the individualized perioperative plan of care. Outcome: Progressing Toward Goal Goal: The patient's right to privacy is maintained. Outcome: Progressing Toward Goal Goal: Patient and family demonstrates knowledge of expected response to operative or invasive procedures Outcome: Progressing Toward Goal  Problem: Acute pain Goal: The patient demonstrates knowledge of pain management. Outcome: Progressing Toward Goal  Problem: Risk for falls Description: The patient is at risk for falls related to [facility] fall Risk score. Goal: Patient is free from signs and symptoms of injuries Outcome: Progressing Toward Goal  Problem: Nausea Description: Patient demonstrates nausea related to anesthesia and/or analgesics.  Goal: The patient receives prescribed medications and solutions, safely administered during the post-op period Outcome: Progressing Toward Goal  Problem: Anxiety Description: Patient demonstrates anxiety related to lack of knowledge about the procedure, lack of privacy and coping mechanisms. Goal: The patient's care is consistent with the individualized perioperative plan of care. Outcome: Progressing Toward Goal Goal: The patient's right to privacy is maintained. Outcome: Progressing Toward Goal Goal: Patient and family demonstrates knowledge of expected response to operative or invasive procedures Outcome: Progressing Toward Goal  Electronically signed by Lebron Robin, RN at 07/01/2024  2:25 PM EDT

## (undated) NOTE — Progress Notes (Signed)
 Formatting of this note might be different from the original. Delivered Hinge Knee Brace to OR on 4K. Received patient sticker.  Electronically signed by Kristy Slough at 07/01/2024  1:48 PM EDT

## (undated) NOTE — Anesthesia Preprocedure Evaluation (Signed)
 Formatting of this note is different from the original. Images from the original note were not included. Surgical Case ID:  [id number]  Relevant Problems  ANESTHESIA (within normal limits)   CARDIOVASCULAR  (+) CHF (congestive heart failure) (CMS-HCC)  (+) HTN (hypertension), benign   GI/HEPATIC/RENAL  (+) Acid reflux  (+) Renal disease   BLOOD HISTORY  (+) DVT (deep venous thrombosis) (CMS-HCC)   ROS/MED HX Patient's Story: Bruce Ball is a 69 y.o.  male  [place]F [place] airport [place] Head trauma (bruising and swelling left side forehead), negativeLOC. Patient noted immediate pain in the Right knee . Unable to bear weight following injury.   Now s/f ORIF, FRACTURE, FEMUR, DISTAL (Right: Femur) 7/2  PMHx CHF HTN Neuropathy Partial deafness (R sided) previous DVT (On AC Eliquis  LD 6/29) CKD   PSH  prostatectomy 2000 R hip + Femur arthroplasty and instrumentation 2022 + multiple nephrolithotomies   OSA ASSESSMENT:  BMI more than 35kg/m2? No   AGE over 67 years old? Yes    GENDER: Male? Yes    Anesthesia Evaluation  Airway:    Mallampati:  III   TM distance:  <3 FB   Neck ROM:  Full Cardiovascular: EKG 6/29  HEART RATE          88 bpm P-R Interval          213 ms IMPRESSION: P Front Axis          34 deg QRSD Interval          92 ms QT Interval          375 ms QTcB          453 ms QRS Axis          37 deg T Wave Axis          35 deg - BORDERLINE ECG - Sinus rhythm Borderline prolonged PR interval Low voltage, extremity leads Rhythm:  Regular,Rate:  Normal,  Neurological:  Some confusion and somnolence not alert and oriented,   Pulmonary: normal  Breath sounds clear to auscultation,    Dental:   normal      Abdominal:   obesity,    Patient Instructions:   Other findings:      Chemistry   Lab Results      Component                Value               Date                      WBC                      6.7                 06/29/2024                HGB                       13.9                06/29/2024                HCT                      42.5                06/29/2024  PLT                      156                 06/29/2024                ALT                      17                  06/27/2024                AST                      23                  06/27/2024                NA                       139                 06/29/2024                K                        3.9                 06/29/2024                CL                       107                 06/29/2024                CREATININE               1.2                 06/29/2024                BUN                      20                  06/29/2024                CO2                      27                  06/29/2024                INR                      1.1 (H)             06/29/2024            Anesthesia Plan  ASA: 3  Anesthesia type: general, regional and MAC  Induction:  Anesthetic: intravenous  Post-op Plan:   Informed Consent:  Patient seen and evaluated.Anesthetic plan and risk discussed with: patient and spouse  Consent obtained     Electronically signed by Bruce Izetta Hila, CRNA at 06/30/2024 12:55 PM EDT  Electronically signed by Bruce Izetta Hila, CRNA at 06/30/2024  1:26 PM EDT Electronically signed by Bruce Raring, MD at 07/01/2024 10:24 AM EDT Electronically signed by Bruce Raring, MD at 07/01/2024 11:06 AM EDT

## (undated) NOTE — Progress Notes (Signed)
 Formatting of this note might be different from the original.  Care Management Progress Note  1. Recommendation: SAR  2. Interventions:  MSW called [facility] [phone] to follow up on pt referral. The liaison stated that currently the beds are full and they are not in [place], but stated MSW could fax referral to [phone]. MSW faxed referral.  MSW met with pt's wife at bedside to discuss dc planning. Pt's wife stated she spoke to [place] [phone] with [facility] and [place] provided the fax # [phone] to send clinicals to. MSW faxed clinicals.    Pt's wife also provided another SAR choice, [facility]. Pt's wife stated she spoke to [facility] [phone] regarding placement.   3. Barriers: Pending an accepting facility   4. Outcomes: Other: Ongoing dc planning   5. Next Steps: Follow up and plan for a safe dc   6. Expected Discharge Date: 7/10   Bruce Ball, MSW [phone] Electronically signed by Ball Bruce, MSW at 07/05/2024  4:51 PM EDT

## (undated) NOTE — Progress Notes (Signed)
 Formatting of this note is different from the original.  PHYSICAL THERAPY TRAUMA EVALUATION Mobility recommendation: Bed in chair position, bedpan Location: 5B32/01  Assessment: Bruce Ball is a 87 y.o.  male  [place] GLF at airport negative- Head trauma, negativeLOC. Patient noted immediate pain in the Right knee . Unable to bear weight following injury. Denies numbness, tingling or paresthesias in the Right lower extremity. Denies pain or injury to any other joint or extremity   Pt presented to PT evaluation significantly limited by pain. He demonstrates limitations in R hip weakness, significant edema to RLE- including ankle, decreased activity tolerance, poor pain tolerance, and deconditioning requiring MaxA for bed mobility. Once pain is controlled, therapist anticipates patient to quickly progress mobility and ambulation. At this time he would benefit from SAR pending improved mobility. Will continue to follow while in hospital  Discharge Recommendations: SAR pending improved mobility Pt is not clear for discharge from PT perspective  Insurance: Payor: [employer] / Plan: [employer] / Product Type: *No Product type* /   Equipment Needs: Defer to facility Pt currently owns cane, rollator, WC, electric scooter, shower chair  Referrals: Case Management, C3, Social Worker, Occupational Therapy  Patient?s Goal: Not stated    Short Term Physical Therapy Goals:  in 6 visit(s), patient will be able to:  Pt will complete supine to sit MinA Pt will complete forward scooting MiNA Pt will complete lateral scooting MiNA Pt will complete STS ModA LRAD reaching upright posture.  Pt will maintain static sitting balance for 2 minutes without UE support.  Pt to ambulate 38ft LRAD maintaining WB precautions ModA  Long Term Physical Therapy Goals: in 12 visits, patient will be able to: Pt to ambulate 2ft LRAD w/ WB precautions CGA. Pt to complete  supine to sit and bilateral rolling SBA Pt will complete forward and scooting SBA Pt will complete STS LRAD MinA reaching upright posture.   Frequency and Duration: Daily  Interventions: Moderate complexity evaluation: evolving clinical presentation with changing characteristics, Therapeutic exercise , Neuro Re-education, Gait training, Functional activity, and Wheelchair management  _________________________________________________________________________________________________________________________________________________________  History this Admission/Hospital Course: Per chart review: Bruce Ball is a 76 y.o.  male  S/p GLF at airport negative- Head trauma, negativeLOC. Patient noted immediate pain in the Right knee . Unable to bear weight following injury. Denies numbness, tingling or paresthesias in the Right lower extremity. Denies pain or injury to any other joint or extremity   Diagnosis: 57 year old male admitted on 06/27/2024 s/p fall at airport -  R periprosthetic MFC fx  Surgeries - R femur ORIF on 07/01/24 with Dr. Jackquelyn  Precautions: Falls ; Pt is deaf in R ear Weight Bearing Status: NWB RLE Bracing/Splinting Needs: RLE knee immobilizer at all times Movement Precautions/Spine Clearance: C/T/L spines cleared per trauma tertiary 06/30   PMHx/Relevant Co-morbidities:  Past Medical History:  Diagnosis Date   CHF (congestive heart failure) (CMS-HCC)    HTN (hypertension)    Neuropathy    Partial deafness    PE (pulmonary thromboembolism) (CMS-HCC)    Renal disease    Past Surgical History:  Procedure Laterality Date   PR OPEN TX FEMORAL FRACTURE DISTAL MED/LAT CONDYLE Right 07/01/2024   Preliminary Information: Procedure: ORIF, FRACTURE, FEMUR, DISTAL;  Surgeon: Jackquelyn Debby Cough, MD;  Location: [facility] OR;  Service: Orthopedics    Social Hx and Prior Level of Function: Patient lives with wife in street level condo, no pets, walk-in shower.  Baseline Functional  Level: at baseline patient uses Rollator for gait and may require assistance with ADLs.  Employment/Education/Profession/Hobbies: unknown  Falls History:             STEADI Fall Questions:  Have you had a fall in the past year? yes  Do you feel unsteady when standing or walking? yes  Do you worry about falling?  yes   SUBJECTIVE:   Patient agreed to participate in PT.    Pain:  Location: R knee   Intensity: 8/10  Type: sharp  Exacerbation: muscle activation, movement  Relief: avoiding movement, pain medication   OBJECTIVE:  Communication Ability/Affect/Cognition/Learning Style:  Patient is alert and oriented x 4 and English speaking Learning/Coping Styles and Overall Behavior Patterns: follows all verbal commands and is cooperative   Landscape architect:  The patient is an elderly, Caucasian male of overweight build encountered in semi-reclined position with wife at bedside. Patient on room air with Hinged knee brace to RLE, ACE bandage c/d/I to RLE, BUE IV - R running and L capped.  Skin Inspection: Significant edema to R lower leg, ankle, and foot, noted bruising on BUE and hematoma to R forehead   Cardiopulmonary System: Patient denied dizziness and/or lightheadedness during session  Patient on room air See flowsheet  Musculoskeletal System:  Range of Motion:  Head/Neck/Trunk Posture: WFL   RLE:   Ankle: Lacking ~15deg DF; noted edema that is also limiting    Knee: KI locked in 20deg flexion   Hip: Able to reach ~80deg flexion sitting EOB; unable to stand LLE:   Ankle: Able to achieve ~5deg DF    Knee: Able to achieve 90deg flexion sitting EOB and extension in long sitting in bed   Hip: Able to achieve 90deg flexion sitting EOB  Strength:   RLE: partial ankle pump and hip abduction; unable to complete SLR; grossly 2/5  LLE: Able to complete ankle pump, heel slide, hip abduction, partial SLR; grossly 3/5  Sensation: No altered sensation  reported; pt has neuropathy at baseline  Neuromuscular System and Mobility: Supine to sit: MaxA w/ supportive assistance sequencing BLE, cues for hand placement on bedrail and HHA for LUE to cross midline for bedrail. Difficulty with trunk flexion/rotation sequencing despite maxA from therapist and RN behind.  Static sitting: Fair, unable to lift 1 UE without ModA from therapist despite LLE on ground  Forward scooting: MaxA despite max VC/TC for sequencing and BLE positioning Sit to stand: ModA w/ RW, cues for hand placement, BLE positioning, overall safety. Unable to reach upright position 2/2 pain and difficulty with full weight on LLE. Pt declined to attempt another rep. Static standing: Deferred 2/2 pain Gait: Deferred 2/2 pain Stairs: NT Stand to sit: Poor w/ ModA from therapist w/ inability to reach back for bed  Sit to supine: MaxAx2 with therapist and RN assist. Support provided to BLE, max VC/TC for sequencing to elbow and trunk rotation.  MaxAx2 repositioning higher in.   Therapeutic Exercise: Cryotherapy to R knee used with attempted SLR, hip abduction, and supine to sit transfer to assist in decreased pain. RN at bedside to provide pain medication. Cryotherapy did not decrease pain. Education provided on benefits of decreasing inflammation and anticipation of cryotherapy to continue to provide relief once pain is more controlled.  Functional Outcome Measure [facility] AM-PAC? 6 Clicks Basic Mobility Inpatient Short Form   Unable  1 A Lot  2 A Little  3 None 4    How much  difficulty does the patient currently have...   1. Turning over in bed (including adjusting bedclothes, sheets and blankets?) X     2. Sitting down on and standing up from a chair with arms (e.g. a wheelchair, bedside commode, etc.) X     3. Moving from lying on back to sitting on the side of the bed?  X       How much help from another person does the patient currently need...   4. Moving to and  from a bed to a chair (including a wheelchair?) X     5. Need to walk in a hospital room? X     6.Climbing 3-5 steps with a railing? X     TOTAL SCORE 6 / 24    97161 Low Complexity 97162 Moderate Complexity 97163 High Complexity  HISTORY     No personal factors and/or comorbidities     1-2 personal factors and/or comorbidities     3 or more personal factors and/or comorbidities   X  EXAMINATION OF BODY SYSTEMS (body structures, functions, activity limitations, participation restrictions)     Addressing 1-2 elements     Addressing a total of 3 or more elements  X   Addressing a total of 4 or more elements     CLINICAL PRESENTATION     Stable     Evolving  X   Unstable      Coordination, consultation, and collaboration of care with interdisciplinary team: RN, OT, MD, and SW&CM  Treatment This Session: Moderate complexity evaluation, Therapeutic exercise x 10 minutes, and Functional activity x 48 minutes ; cryotherapy applied during session  Education Provided: Role of Physical Therapy, Physical Therapy Plan Of Care, Safety, Risk of falls  Patient Education this session:  Patient verbalizes  readiness and needs for therapy program:  yes Patient prefers verbal, written, and visual instructions and shows some barriers to learning. Barriers: low health literacy   Patient understands concepts and needs for therapy program: yes   Time (In/Out): 9062-8949  Electronically Signed By: Duwaine Kiang, PT, DPT Electronically signed by Kiang Duwaine, PT at 07/02/2024  3:44 PM EDT

## (undated) NOTE — Unmapped External Note (Signed)
 Formatting of this note might be different from the original.  Problem: Infection/Isolation (Risk/Actual) Goal: Patient will be free of hospital acquired infection throughout hospitalization Outcome: Continue with Current Goal  Problem: Pain/Comfort Goal: Adequate pain control while hospitalized Outcome: Continue with Current Goal  Problem: Fall Risk Goal: Patient will remain as independent as possible Outcome: Continue with Current Goal Goal: Patient will have lower fall risk. Lower injury risk. Outcome: Continue with Current Goal Goal: Patient will remain safe from falls and injury Outcome: Continue with Current Goal Goal: Patient/family will understand fall prevention measures Outcome: Continue with Current Goal Goal: Patient/family will understand injury reduction measures Outcome: Continue with Current Goal Goal: Patient/family will comply with fall program Outcome: Continue with Current Goal Goal: Patient/family will verbalize fall prevention strategies to implement after discharge Outcome: Continue with Current Goal  Electronically signed by Nguyen, Lien, RN at 06/28/2024 10:10 AM EDT

## (undated) NOTE — ED Notes (Signed)
 Formatting of this note might be different from the original.  Ambulance En-Route Information  Ambulance Call Information Date Call Received: 06/27/24 Time Call Received: 1436 Agency: [facility] Unit: 1  EMS Patient Information Patient Age: 73 Patient Gender: Male Patients Complaint/ Ems Treatment: hematoma above eye, bruise to the knee EMS Treatment: transport Was a Stat Pack pulled for this patient?: No This patient has a case type of?: Trauma Mechanism of Injury: fall $Trauma Activation: L2 Level 2 Trauma: Any age patient on anticoagulant/platelet inhibitor (excluding ASA) with mechanism attributed to trauma Time Trauma Team Notified?: 1441 Time Trauma Team Notified?: 1441   Mechanism of Injury: fall      Pre-Vitals Blood Pressure: (!) 185/91 Heart Rate: 66 Respirations: 20 SpO2: 95 % BGL: 114 GCS Score: 15  Patient Arrival Information Arrival Date: 06/27/24 Arrival Time: 1514 Patient Disposition: [facility]  Charleen Blanks  Electronically signed by Blanks Charleen at 06/27/2024  3:35 PM EDT

## (undated) NOTE — Progress Notes (Signed)
 Formatting of this note might be different from the original. OCCUPATIONAL THERAPY PROGRESS NOTE RN/Staffing recommendations:  EOB meals; Delirium Precautions   Location: 5B32/01  Injury/Diagnosis Complex  - R periprosthetic MFC fx  Surgeries - R femur ORIF on 07/01/24 with Dr. Jackquelyn  Precautions:  Falls: Cristopher Moats Fall Risk Assessment Scale: 34 (Scoring: 7-10 low fall risk, 11-14 moderate fall risk, 15+ high fall risk)  Bracing/Splinting Needs:  RLE HKB locked at 20 Movement Precautions:  NWB RLE Spine clearance: C/T/L spine cleared by Trauma Tertiary   Visit # 2  SUBJECTIVE: Pt agreeable to participate w/ OT tx  Pain: denied  OBJECTIVE:   Integumentary System/General Visual Inspection:  Pt received semifowlers w/ bed alarm on on OT arrival, on floor monitoring, HKB donned, L SCD Skin Integrity: edema to RLE  Cognition: Alertness: alert Orientation: x4 Command Following: 100% Safety/Judgement/Insight: fair Communication: functional  Treatment This Session: AIF x 25 min  Mobility: Bed mobility: independent to roll R/L for peri care and bedpan placement Supine<>Sit: stand by assist to the EOB HOB elevated 1 min rest break 2/2 deconditioning Increased time to complete Vc for sequencing & form  Scooting EOB: stand by assist for lateral scoots to the R/L at EOB Min hip clearance to the R Fair hip clearance to the L     ADLs: Feeding: modified independent at EOB Toileting: maximum assistance for posterior peri care at bed level  Cardiovascular response to activity:   Pt on room air.     Pt left sitting up at EOB w/ call light/all other needs w/in reach, bed alarm on, NAD. SBAR to RN on exit.  ASSESSMENT:  Pt tolerated OT tx well- noted w/ good participation in mobility & ADLs. Patient w increased RLE edema this date. Pt continues to be limited by generalized weakness and deconditioning. OT will continue to follow while inpatient to address above mentioned  deficits + maximize I/safety w/ ADLs/mobility.   Recommendations for Discharge:  Subacute Rehab (patient able to tolerate 1-2 hours of skilled therapy services daily)  Insurance: Payor: [employer] / Plan: [employer]  [employer] / Product Type: *No Product type* /   Equipment recommendations: Defer to facility  Progress Towards Goals: Continue with current goals  Short Term Occupational Therapy Goals:  in 4 visit(s), pt will be able to:  Perform hygiene tasks mod I sitting EOB vs sinkside Perform BSC transfer with min A Perform UB dressing I Perform LB dressing min A w AE PRN while adhering to WB precautions  Long Term Occupational Therapy Goals: in 8 visit(s), pt will be able to:  Perform LB dressing mod I w AE PRN Perform BSC tx mod I w RW Perform standing ADL  Social Hx and Prior Level of Function:  Pt lives w wife in 2 level home Supervision available at discharge? Y, pt wife Stairs/steps to enter: 0 Stairs/steps inside home: 2 level home Able to stay on main level if needed Bathroom set-up: walk in w chair Home DME: Rolator, cane, wc Prior level of function: I w ADLs and mod I mobility Rolator for house when doing chores- pt used bench to hold things Cane sometimes Leisure/Employment: not gathered Hand Dominance:  R  PLAN: Frequency and Duration: continue OT POC  3 visits per week   Time (In/Out): 1049 - 1127  Electronically Signed By: Armin Pinal OTR/L    Electronically signed by Pinal Armin, OT at 07/09/2024  9:36 AM EDT

## (undated) NOTE — Progress Notes (Signed)
 Formatting of this note might be different from the original. Patient returned to floor from PACU  in NAD. VSS. All safety measures in place. Wife bedside. Bed alarm on, SCD in place. Plan of care ongoing.  Electronically signed by Troup, Anyssa, RN at 07/01/2024  7:03 PM EDT

## (undated) NOTE — Unmapped External Note (Signed)
 Formatting of this note is different from the original.  Problem: DVT (Risk For) Goal: Demonstrates adequate circulation status as evidenced by the following indicators: Peripheral pulses strong/Peripheral pulses symmetrical/ Peripheral edema not present Outcome: Continue with Current Goal  Problem: Knowledge Deficit Goal: Patient will verbalize and/or demonstrate understanding of teaching by discharge Outcome: Continue with Current Goal Goal: Patient will Participate in learning opportunities offered by staff Outcome: Continue with Current Goal  Problem: Infection/Isolation (Risk/Actual) Goal: Patient will be free of hospital acquired infection throughout hospitalization Outcome: Continue with Current Goal  Problem: Pain/Comfort Goal: Adequate pain control while hospitalized Outcome: Continue with Current Goal  Problem: Fall Risk Goal: Patient will remain as independent as possible Outcome: Continue with Current Goal Goal: Patient will have lower fall risk. Lower injury risk. Outcome: Continue with Current Goal Goal: Patient will remain safe from falls and injury Outcome: Continue with Current Goal Goal: Patient/family will understand fall prevention measures Outcome: Continue with Current Goal Goal: Patient/family will understand injury reduction measures Outcome: Continue with Current Goal Goal: Patient/family will comply with fall program Outcome: Continue with Current Goal Goal: Patient/family will verbalize fall prevention strategies to implement after discharge Outcome: Continue with Current Goal  Problem: Skin Alteration (Risk for/Actual) Goal: Patient?s skin integrity will remain intact Outcome: Continue with Current Goal Goal: Patient will evidence no skin breakdown Outcome: Continue with Current Goal Goal: Patient will regain integrity of skin tissue Outcome: Continue with Current Goal  Problem: Anxiety Goal: The patient's care is consistent with the  individualized perioperative plan of care. Outcome: Continue with Current Goal Goal: The patient's right to privacy is maintained. Outcome: Continue with Current Goal Goal: Patient and family demonstrates knowledge of expected response to operative or invasive procedures Outcome: Continue with Current Goal  Problem: Anxiety Goal: The patient's care is consistent with the individualized perioperative plan of care. Outcome: Continue with Current Goal Goal: The patient's right to privacy is maintained. Outcome: Continue with Current Goal Goal: Patient and family demonstrates knowledge of expected response to operative or invasive procedures Outcome: Continue with Current Goal  Problem: Acute pain Goal: The patient demonstrates knowledge of pain management. Outcome: Continue with Current Goal  Problem: Risk for falls Goal: Patient is free from signs and symptoms of injuries Outcome: Continue with Current Goal  Problem: Nausea Goal: The patient receives prescribed medications and solutions, safely administered during the post-op period Outcome: Continue with Current Goal  Problem: Post-Operative Respiratory Complications Goal: Patient Will Remain Free Of Post-Operative Complications Outcome: Continue with Current Goal Goal: Effective Utilization Of Incentive Spirometry Description: INTERVENTIONS: 1. 10 times every hour while awake   2. Document volume every 4 hours Outcome: Continue with Current Goal Goal: Effective Pulmonary Toileting Description: INTERVENTIONS: 1. Cough/deep breath every 2 hours while awake Outcome: Continue with Current Goal Goal: Appropriate Oral Care Maintenance Description: INTERVENTIONS:  1. Oral care every 12 hours Outcome: Continue with Current Goal Goal: Adequate Patient/Family Education Description: INTERVENTIONS: 1. Patient/family education provided  Outcome: Continue with Current Goal Goal: Implementation Of Appropriate Early Mobility  Interventions Description: INTERVENTIONS: 1.  (Acute Care/Med-Surg) Get-Up-And-Go assessment completed, and interventions initiated 2.  (Critical/Intermediate Care) M.O.V.E.S. assessment completed, and interventions initiated Outcome: Continue with Current Goal Goal: Maintenance of HOB Elevation Description: INTERVENTIONS: 1. HOB 30 degrees Outcome: Continue with Current Goal  Problem: Anxiety Goal: The patient's care is consistent with the individualized perioperative plan of care. Outcome: Continue with Current Goal Goal: The patient's right to privacy is maintained. Outcome: Continue with Current Goal  Goal: Patient and family demonstrates knowledge of expected response to operative or invasive procedures Outcome: Continue with Current Goal  Problem: Acute pain Goal: The patient demonstrates knowledge of pain management. Outcome: Continue with Current Goal  Problem: Risk for falls Goal: Patient is free from signs and symptoms of injuries Outcome: Continue with Current Goal  Problem: Nausea Goal: The patient receives prescribed medications and solutions, safely administered during the post-op period Outcome: Continue with Current Goal  Problem: Decreased Gait Goal: Ambulate with least restrictive assistive device Outcome: Continue with Current Goal Goal: Improve balance Outcome: Continue with Current Goal  Problem: Decreased ADL Independence Goal: Improve ADL independence Outcome: Continue with Current Goal  Electronically signed by [name] Rife, RN at 07/11/2024 10:25 PM EDT

## (undated) NOTE — Progress Notes (Signed)
 Formatting of this note is different from the original. INPATIENT INITIAL ASSESSMENT Medical Nutrition Therapy  Bruce Ball  89 y.o. male s/p GLF.  Injury/Diagnosis Complex  - R periprosthetic MFC fx  Patient Active Problem List  Diagnosis   Closed displaced fracture of condyle of right femur (CMS-HCC)   Trauma   DVT (deep venous thrombosis) (CMS-HCC)   HTN (hypertension), benign   Acid reflux   CHF (congestive heart failure) (CMS-HCC)   Renal disease   Past Medical History:  Diagnosis Date   CHF (congestive heart failure) (CMS-HCC)    HTN (hypertension)    Neuropathy    Partial deafness    PE (pulmonary thromboembolism) (CMS-HCC)    Renal disease    Height: 180.3 cm (5' 10.98)    Weight: 112.8 kg (248 lb 11.2 oz)   Body mass index is 34.7 kg/m. %Weight Change: no change RBW: 79 kg EEN: (25-30)1975-2370 kcal/kg EPN: (1.1-1.2)79-94.8 gm/kg  DT Assessment: pt with a good appetite, mainly getting outside foods. No n/v/d. With BM. Wife at bedside.   Nutrition History:  No Known Allergies Medical Tests/Procedures: R femur ORIF on 07/01/24 with Dr. Jackquelyn  Physical Findings:  Level of Malnutrition: Nourished Unintentional Weight Loss: Does not meet criteria  Wound Assessment: skin tear face right,surgical wound Current Nutrition Therapy: DIET REGULAR Significant Labs: bun 29,ca 8.1 Significant Medications:  Current Facility-Administered Medications  Medication Dose Route Last Rate Last Admin   oxyCODONE  IR (ROXICODONE ) tablet 5 mg  5 mg Oral   5 mg at 07/06/24 2232   oxyCODONE  IR (ROXICODONE ) tablet 2.5 mg  2.5 mg Oral       apixaban  (ELIQUIS ) tablet 2.5 mg  2.5 mg Oral   2.5 mg at 07/07/24 1019   DULoxetine  DR (CYMBALTA ) capsule 60 mg  60 mg Oral   60 mg at 07/07/24 1019   lidocaine  (LIDODERM ) 5 % patch 1 patch  1 patch Transdermal   1 patch at 07/07/24 1019   acetaminophen  (TYLENOL ) tablet 1,000 mg  1,000 mg Oral   1,000 mg at 07/07/24 0612   normal saline 0.9 %  flush 10 mL  10 mL Intravenous   10 mL at 07/07/24 1020   And   sodium chloride  0.9 % IV fluid 500 mL  500 mL Intravenous       normal saline 0.9 % flush 3 mL  3 mL Intravenous   3 mL at 07/07/24 0613   normal saline 0.9 % flush 3 mL  3 mL Intravenous   3 mL at 07/05/24 0535   ondansetron  (ZOFRAN ) 4 MG/2ML injection 4 mg  4 mg Intravenous       sennosides (SENNA-GEN) 8.6 MG tablet 2 tablet  2 tablet Oral   2 tablet at 07/06/24 2232   polyethylene glycol (MIRALAX ) packet 17 g  17 g Oral   17 g at 07/06/24 9063   moisturizer/plaque preventer (Antiseptic Mouthwash (Corinz)) mouth swab 1 Swab  1 each Mouth/Throat   1 Swab at 07/05/24 2125   gabapentin  (NEURONTIN ) capsule 300 mg  300 mg Oral   300 mg at 07/06/24 2232   naloxone (NARCAN) injection 0.08 mg  0.08 mg Intravenous       bisacodyl  (DULCOLAX) EC tablet 5 mg  5 mg Oral       Or   bisacodyl  (DULCOLAX) suppository 10 mg  10 mg Rectal       AmLODIPine  (NORVASC ) tablet 10 mg  10 mg Oral   10 mg at 07/07/24  1019   atorvastatin  (LIPITOR) tablet 20 mg  20 mg Oral   20 mg at 07/06/24 2232   carvedilol  (COREG ) tablet 25 mg  25 mg Oral   25 mg at 07/06/24 2232   cetirizine (ZYRTEC) tablet 10 mg  10 mg Oral       cholecalciferol  (Vitamin D3) tablet 2,000 Units  2,000 Units Oral   2,000 Units at 07/07/24 1019   furosemide  (LASIX ) tablet 20 mg  20 mg Oral       methocarbamol  (ROBAXIN ) tablet 500 mg  500 mg Oral   500 mg at 07/05/24 1046   pantoprazole  DR (PROTONIX ) tablet 40 mg  40 mg Oral   40 mg at 07/07/24 1020   fluticasone  (FLONASE ) 50 MCG/ACT nasal spray 2 spray  2 spray Nasal       Food Insecurity: Negative Food Insecurity  Patient has been assessed with the 2 Hunger Vital Signs questions and has screened (+ / -) for Food Insecurity.  Education Needs: no needs  NUTRITION DIAGNOSIS: Increased nutrient needs (protein) NI-5.1 related to trauma Nutrition Interventions: General/healthful diet ND-1.1 Oral diet/nutrient  modifications  PLAN: Continue diet. Will add Boost TID to increase calories. Please add MVI with minerals  Obtain new geriatric labs (Folate, Vit B12, Vit D 25-OH) as feasible and replete PRN   Nutrition Goal: PO intake to meet 75 - 100% nutrition needs Throughout stay Monitoring/Evaluation: will monitor lab values, p/o intake  Goal Outcome/Progress: Progressing toward goal  Ramon Cobble [phone] For nights/weekends page [phone]   Electronically signed by Cobble Ramon, DIETETIC TECH at 07/07/2024 11:51 AM EDT

## (undated) NOTE — Progress Notes (Signed)
 Formatting of this note is different from the original. Orthopaedic Progress Note  Patient Info: Name: Bruce Ball  Admission Date: 06/27/2024  3:16 PM  MRN: [id number] Hospital Day:  LOS: 4 days   Room/Bed: 5B32/01 DOB: 1946/06/09   Age: 60 y.o.    Interval Update / Subjective  Patient seen in PACU. Somnolent but awake No acute events since OR  Objective  Physical Exam Gen: NAD, AOx3 Pulm: Non-labored breathing Cards: Regular rate and rhythm   RLE Dressings c/d/i  Too somnolent to participate in exam, but moving spontaneously Compartments soft, compressible Toes WWP, BCR x5 2+ pulse  Assessment  Bruce Ball is a 35 y.o. male s/p R femur ORIF on 07/01/24 with Dr. Jackquelyn.  Plan  #R Femur Fx - Splints/Ex-fix: none - Surgery(s) complete - Follow-up: 07/20/24 - PT/OT: eval/treat  Current Activity Order (From admission, onward)         Ordered    Activity - Weight Bearing and Restrictions  UNTIL DISCONTINUED       Question Answer Comment  Activity Restrictions Weight Bearing   Activity Restrictions Brace Wear Precautions   Weight bearing status RUE Weight bearing as tolerated   Weight bearing status LUE Weight bearing as tolerated   Weight bearing status RLE Non-weight bearing   Weight bearing status LLE Weight bearing as tolerated   Bracing Wear RLE   Patient to wear RLE brace At all times (specify when to remove)   When to remove RLE brace N/A     07/01/24 1411        #PPX - Antibiotics24 Hr Periop Ancef  - IP: Lovenox  - at Discharge: ASA   ______________________________________________________  Therisa Poisson, MD [facility] Orthopaedic Surgery  -------------------------------------------------------------------------------------------  Ortho Primary - [phone]  Orthopaedic primary patients Admitted to Ortho Trauma or Ortho Joints  Ortho New Consult - PIC [phone]  New Orthopaedic consult patients or newly found injuries Questions about ALL MTC, CDU, IOU & ED  Patients   Ortho Existing Consult Patient - [place] [phone] Existing consult patient questions NOT admitted to Ortho Trauma   Ortho Spine - PIC [phone] Including imaging review   Ortho Hand - PIC [phone]  Podiatry - PIC [phone] kends)  Electronically signed by Poisson Therisa Jes, MD at 07/01/2024  6:03 PM EDT

## (undated) NOTE — Unmapped External Note (Signed)
 Formatting of this note might be different from the original.  Problem: Decreased Gait Goal: Ambulate with least restrictive assistive device Outcome: Continue with Current Goal Goal: Improve balance Outcome: Continue with Current Goal  Duwaine Kiang, PT, DPT Electronically signed by Kiang Duwaine, PT at 07/02/2024 11:11 AM EDT

## (undated) NOTE — Progress Notes (Signed)
 Formatting of this note is different from the original. This patient is on [facility] Orthopedic Surgery Internal Medicine Co-Management Team W.  Orthopedic Surgery can be reached at pic [phone] for any questions regarding pain control or the injured/operative extremity. [facility] Team W can be reached at pic [phone] for any questions regarding vital sign abnormalities, blood sugar, urinary retention, constipation, or any other medical concerns.  In the event of a patient emergency, please page [facility] Team W at pic [phone].  Internal Medicine Progress Note  Service: [facility] Medicine Team [facility] Day: LOS: 7 days  Attending: Almarie Gwen Sharps, MD, MD  Subjective / Interval History   No acute events overnight. S/p R femur ORIF POD#3. Denies issue with the R upper chest wall pain we discussed yesterday. States Lidocaine  patch was placed on his leg instead of the chest wall. Despite this, chest wall pain is not an issue today.   Currently on bedpan this morning.   Objective  Physical Exam (5) 24hour vital signs: Temp:  [36.7 C (98.1 F)-37.4 C (99.3 F)] 36.8 C (98.2 F) Heart Rate:  [69-78] 76 Respirations:  [15-18] 18 Blood Pressure: (127-171)/(54-74) 161/72  @LASTSAO2 (1)@ Weight: 112.8 kg (248 lb 11.2 oz)   I/O last 3 completed shifts: In: 360 [P.O.:360] Out: 1260 [Urine:1260]  GEN:  NAD, conversant HENT:  NCAT, moist mucus membranes CV:  RRR, no M/R/G PULM:  CTAB, no W/R/R ABD:  Soft, NT/ND, NABS MSK:  R knee immobilizer in place, TTP of R upper chest wall, no bruising noted SKIN:  Bruising/hematoma to R forehead over eye, stable  Medications Scheduled Meds:  lidocaine   1 patch Transdermal Q24H   acetaminophen   1,000 mg Oral TID   normal saline  10 mL Intravenous EVERY 8 HOURS   Enoxaprin [place] inj for VTE Prophylaxis (HIGH risk for VTE)  30 mg Subcutaneous Q12H SCH   sennosides  2 tablet Oral BID   polyethylene glycol  17 g Oral Daily   moisturizer/plaque preventer  1 each  Mouth/Throat BID   gabapentin   300 mg Oral Nightly   AmLODIPine   10 mg Oral Daily   [Held by Provider] apixaban   2.5 mg Oral BID   atorvastatin   20 mg Oral Nightly   carvedilol   25 mg Oral Q12H   cholecalciferol   2,000 Units Oral Daily   pantoprazole  DR  40 mg Oral Daily   DULoxetine  DR  30 mg Oral Daily   Continuous Infusions:  PRN Meds:[COMPLETED] Insert Midline Catheter **AND** Maintain Midline Catheter **AND** normal saline **AND** sodium chloride , [COMPLETED] Insert Peripheral IV **AND** Maintain Peripheral IV **AND** normal saline, [COMPLETED] Insert Peripheral IV **AND** Maintain Peripheral IV **AND** normal saline, ondansetron , traMADol  **OR** oxyCODONE  IR, naloxone, bisacodyl  **OR** bisacodyl , cetirizine, furosemide , methocarbamol , fluticasone   Labs  CMP: Recent Labs    07/02/24 0122 07/03/24 0627  NA 138 141  K 4.1 3.8  CL 103 105  CO2 25 28  BUN 22 26*  CREATININE 1.1 1.2  GLU 165* 102  CALCIUM  8.1* 8.2*  MG  --  2.0  BILITOT  --  1.1  BILIDIR  --  0.2  ALKPHOS  --  54  AST  --  23  ALT  --  10*  PROT  --  5.7*  LABALB 3.6 3.6   CBC and Coags: Recent Labs    07/02/24 0122 07/03/24 0627  WBC 6.7 9.6  HGB 13.9 13.4  HCT 41.2 39.3*  MCV 84.0 84.0  PLT 202 199  Cardiac Enzymes: No results for input(s): CKTOTAL, CKMB, TROPONINI in the last 72 hours.  Assessment/Plan:  Bruce Ball is a 46 y.o. male with PMHx PE (2023 per wife), CKD3a, Hx of prostrate cancer (01/ 2003, Gleason 3+3, 03-10-2002 s/p radical prostatectomy pelvic lymph node disections, no recurrence), lumbosacral radiculopathy, CAD (CAC on CT), PVCs on Coreg , HTN, GERD, vitamin D  deficiency, essential tremor, Hx Sarcoma (~1970, s/p revision right proximal femur replacement in setting of distal periprosthetic femur fx both components 09/2021), osteoporosis (Tx IV reclast yearly), HLD with Frailty score of 0.17 [place] right periprosthetic distal femoral  condyle fracture.    #Pre-op Risk Stratification & Medical Optimization #Periprosthetic distal femur fx s/p ORIF 7/3 #Hx Sarcoma (~1970, s/p revision right proximal femur replacement in setting of distal periprosthetic femur fx both components 09/2021) #Pain Management CT Femur R: displaced periprosthetic fracture along the superior medial femoral condyle. Revised R hip prosthesis, now with periprosthetic distal femur fx. Functional status appears fair; frailty index 0.17. No current cardiac symptoms;  - surgical management per ortho - RCRI = 1 (Hx CAD); low risk for proposed surgery - cont on HYDROmorphone  PRN + methocarbamol  PRN. - Pain management per ortho - Tylenol  1g TID PRN - senna + miralax  for bowel regimen  #Chest wall pain, resolved No concerning findings on exam Trial with lidocaine  patch to chest wall   #Hx PE (2023) On for prior PE (12/2019). INR 1.4, PT 15.6.  - hold apixaban  pre-op; coordinate timing with anesthesia and ortho. No bridging indicated if low to moderate risk. - Resume post-op when hemostasis secure. No recent VTE.  #HTN BP 194/94 on admission; patient on amlodipine  10 mg daily and and carvedilol  25 mg BID  - Continue amlodipine  and coreg   #CKD3a (Cr 1.5, baseline per chart) Labs otherwise stable (K 4.5, CO2 24, BUN 20) - Avoid nephrotoxins (NSAIDs, contrast) - Ensure adequate volume status pre/post-op  #CAD #PVC On carvedilol  25 mg BID, well tolerated. No CP, SOB, or recent angina. Low suspicion for active cardiac ischemia. - No further cardiac work-up indicated unless new symptoms emerge. - Coreg  held on admission, however, no need to hold prei-op BB in pt who is on long standing, stable dose and stable vitals -Continue Coreg   #Osteoporosis On annual Reclast. Recent fracture underscores disease severity. - Ensure adequate calcium  + vit D intake post-op. - Consider DEXA recheck post-recovery  #Hx prostrate cancer (01/ 2003, Gleason 3+3,  03-10-2002 s/p radical prostatectomy pelvic lymph node disections, no recurrence) #Lumbosacral radiculopathy #Chronic R Hip Pain Stable. New pain from new fracture.  - continue gabapentin  300 mg nightly - continue duloxetine  30 mg daily  #Essential Tremor - continue home BB post-op  #Incidental findings Multiple predominantly low attenuation masses throughout the liver are mostly subcentimeter with others that range up in size to 3.6 cm x 2.1 cm x 1.7 cm (segment IV). Most are well-defined, but several subcentimeter lesions in the right lobe are faint and poorly defined. 2. Locally enlarged thyroid  with multiple nodules better detected by concurrent chest CT. Largest nodule measures 1.6 cm and is in the left lobe  Pt to follow up with incidental findings with primary providers [place]  General:  - Diet/education: Regular diet  - DVT Prophylaxis: Lovenox   - Functional Status/Consults: PT/OT  - Lines/Catheters currently in place: PIV  - Code Status: Full Code  Discharge Planning   Anticipated Discharge Date TBD  Anticipated Discharge Location Anticipate SAR in [place]  Barriers PT/OT clearance / Accepting facility   Bruce Gwen Sharps, MD [facility] Medicine Team W [phone]  07/04/2024 12:15 PM EDT  This patient is on [facility] Orthopedic Surgery Internal Medicine Co-Management Team W.  Orthopedic Surgery can be reached at pic [phone] for any questions regarding pain control or the injured/operative extremity. [facility] Team W can be reached at pic [phone] for any questions regarding vital sign abnormalities, blood sugar, urinary retention, constipation, or any other medical concerns.  In the event of a patient emergency, please page [facility] Team W at pic [phone].  Electronically signed by Ball Bruce Gwen, MD at 07/04/2024 12:09 PM EDT Electronically signed by Ball Bruce Gwen, MD at 07/04/2024  1:21 PM EDT

## (undated) NOTE — Nursing Note (Signed)
 Formatting of this note might be different from the original. Received report from Ajae RN at bedside. Patient is A/O x 4, no signs of acute distress, bed is in the lowest position, locked, and in zone 2. Saturating well at room air. Board updated, side rails are up, call light is within reach. Continuing plan of care.    Electronically signed by Levan Rondo, RN at 07/11/2024  9:09 AM EDT

## (undated) NOTE — Discharge Summary (Signed)
 Formatting of this note is different from the original. DISCHARGE SUMMARY  Date of admission: 06/27/2024 Date of discharge: 07/12/2024  Admission diagnosis:Trauma Discharge diagnosis: SAME  Encounter problem list: Patient Active Problem List  Diagnosis   Closed displaced fracture of condyle of right femur (CMS-HCC)   Trauma   DVT (deep venous thrombosis) (CMS-HCC)   HTN (hypertension), benign   Acid reflux   CHF (congestive heart failure) (CMS-HCC)   Renal disease   Injury/Diagnosis Complex  - R periprosthetic MFC fx  Surgeries - R femur ORIF on 07/01/24 with Dr. Jackquelyn Salvage Course: Patient was admitted to the hospital on 06/27/2024 . Orthopaedic evaluation revealed above diagnoses.  On above date(s) the patient underwent the above procedure(s). The patient was extubated and take to PACU in stable condition.    The patient tolerated the procedure well without complications and returned to the floor for continued post-operative care and evaluation. The patient's pain was treated with IV and oral pain medications and they received appropriate dvt prophylaxis and perioperative antibiotics.   Med W was consulted for management of medical comorbidities.   While in the hospital the patient had physical therapy. The patient was cleared for discharge. The remainder of the stay was uneventful and uncomplicated. The patient expressed comfort with follow up in the Orthopaedic [facility]. At the time of discharge patient was afebrile, stable vitals, tolerating regular diet, and voiding without difficulty and was determined to be appropriate for discharge.   Medications:  Medication List    START taking these medications    acetaminophen  500 MG tablet Commonly known as: TYLENOL  Take 2 tablets (1,000 mg total) by mouth 3 times every day.  bisacodyl  5 mg EC tablet Commonly known as: DULCOLAX Take 1 tablet (5 mg total) by mouth every day as needed for Constipation (if no BM in last 48  hours).  naloxone 4 MG/0.1 ML nasal spray Commonly known as: NARCAN Use 1 spray by nasal route as needed for opioid overdose.  May redose (new device) using alternate nostril after 2-3 minutes if needed.  oxyCODONE  IR 5 mg immediate release tablet Commonly known as: ROXICODONE  Take 0.5 tablets (2.5 mg total) by mouth every 6 hours as needed.     CHANGE how you take these medications    DULoxetine  DR 60 mg DR capsule Commonly known as: CYMBALTA  Take 1 capsule (60 mg total) by mouth every day. What changed:  medication strength how much to take     CONTINUE taking these medications    AmLODIPine  10 mg tablet Commonly known as: NORVASC   apixaban  2.5 MG tablet Commonly known as: ELIQUIS   atorvastatin  20 MG tablet Commonly known as: LIPITOR  carvedilol  25 mg tablet Commonly known as: COREG   cetirizine 10 mg tablet Commonly known as: ZYRTEC  ferrous sulfate 325 (65 Fe) MG tablet Commonly known as: IRON 325  fluticasone  50 MCG/ACT nasal spray Commonly known as: FLONASE   furosemide  20 mg tablet Commonly known as: LASIX   methocarbamol  500 mg tablet Commonly known as: ROBAXIN   pantoprazole  DR 40 mg tablet Commonly known as: PROTONIX   Vitamin D3 50 MCG (2000 UT) Caps     STOP taking these medications    HYDROmorphone  4 mg tablet Commonly known as: DILAUDID       Where to Get Your Medications    These medications were sent to [facility]  [address], [place] [address]    Hours: 7am-7pm Mon-Fri, Sat-Sun 9am-1pm Phone:  [phone]  bisacodyl  5 mg EC tablet   You can get these medications from any pharmacy   Bring a paper prescription for each of these medications oxyCODONE  IR 5 mg immediate release tablet You don't need a prescription for these medications acetaminophen  500 MG tablet   Information about where to get these medications is not yet available   Ask your nurse or  doctor about these medications DULoxetine  DR 60 mg DR capsule naloxone 4 MG/0.1 ML nasal spray   Follow-up Future Appointments       Provider Department Center   07/20/2024 8:00 AM Large, Debby Cough, MD [facility] Arrive at: [facility]- 7th Floor [facility]     Activity Restrictions Current Activity Order (From admission, onward)         Ordered    Activity - Weight Bearing and Restrictions  UNTIL DISCONTINUED       Question Answer Comment  Activity Restrictions Weight Bearing   Activity Restrictions Brace Wear Precautions   Weight bearing status RUE Weight bearing as tolerated   Weight bearing status LUE Weight bearing as tolerated   Weight bearing status RLE Non-weight bearing   Weight bearing status LLE Weight bearing as tolerated   Bracing Wear RLE   Patient to wear RLE brace At all times (specify when to remove) HKB locked @ 20  When to remove RLE brace N/A     07/07/24 1045        Wound Care Wound Discharge Instructions      I discussed the plan of care with the patient, including the controlled substance prescription.  I discussed the risks and benefits of this treatment, including safe storage practices.  The patient agrees with the controlled substance prescription.  Cosigned by Large, Debby Cough, MD at 07/12/2024 12:39 PM EDT Electronically signed by Sherle Dies, PA-C at 07/12/2024 10:39 AM EDT Electronically signed by Sherle Dies, PA-C at 07/12/2024 11:09 AM EDT Electronically signed by Sherle Dies, PA-C at 07/12/2024 11:14 AM EDT Electronically signed by Jackquelyn Debby Cough, MD at 07/12/2024 12:39 PM EDT  Associated attestation - Large, Debby Cough, MD - 07/12/2024 12:39 PM EDT Formatting of this note might be different from the original. I am administratively signing this document.  Debby Cough Jackquelyn, MD

## (undated) NOTE — Progress Notes (Signed)
 Formatting of this note is different from the original. This patient is on [facility] Orthopedic Surgery Internal Medicine Co-Management Team W.  Orthopedic Surgery can be reached at pic [phone] for any questions regarding pain control or the injured/operative extremity. [facility] Team W can be reached at pic [phone] for any questions regarding vital sign abnormalities, blood sugar, urinary retention, constipation, or any other medical concerns.  In the event of a patient emergency, please page [facility] Team W at pic [phone].  Internal Medicine Progress Note  Service: [facility] Medicine Team [facility] Day: LOS: 9 days  Attending: Caroll Daniel Maude Luke, MD  Subjective / Interval History  No acute events overnight. This morning, the patient states he was having a rough morning after his condom catheter had slipped off and was overexerted working with PT/OT. No new symptoms.   Objective  Physical Exam (5) 24hour vital signs: Temp:  [36.6 C (97.9 F)-36.9 C (98.4 F)] 36.6 C (97.9 F) Heart Rate:  [71-81] 71 Respirations:  [18] 18 Blood Pressure: (116-160)/(60-82) 160/63  @LASTSAO2 (1)@ Weight: 112.8 kg (248 lb 11.2 oz)   I/O last 3 completed shifts: In: 596 [P.O.:596] Out: 800 [Urine:800]  GEN:  NAD, conversant HENT:  NCAT, moist mucus membranes CV:  RRR, no M/R/G PULM:  CTAB, no W/R/R ABD:  Soft, NT/ND, NABS MSK:  R knee immobilizer in place, TTP of R upper chest wall, no bruising noted SKIN:  Bruising/hematoma to R forehead over eye, stable  Medications Scheduled Meds:  apixaban   2.5 mg Oral BID   DULoxetine  DR  60 mg Oral Daily   lidocaine   1 patch Transdermal Q24H   acetaminophen   1,000 mg Oral TID   normal saline  10 mL Intravenous EVERY 8 HOURS   sennosides  2 tablet Oral BID   polyethylene glycol  17 g Oral Daily   moisturizer/plaque preventer  1 each Mouth/Throat BID   gabapentin   300 mg Oral Nightly   AmLODIPine   10 mg Oral Daily   atorvastatin   20 mg Oral Nightly   carvedilol   25  mg Oral Q12H   cholecalciferol   2,000 Units Oral Daily   pantoprazole  DR  40 mg Oral Daily   Continuous Infusions:  PRN Meds:oxyCODONE  IR, [DISCONTINUED] traMADol  **OR** oxyCODONE  IR, [COMPLETED] Insert Midline Catheter **AND** Maintain Midline Catheter **AND** normal saline **AND** sodium chloride , [COMPLETED] Insert Peripheral IV **AND** Maintain Peripheral IV **AND** normal saline, [COMPLETED] Insert Peripheral IV **AND** Maintain Peripheral IV **AND** normal saline, ondansetron , naloxone, bisacodyl  **OR** bisacodyl , cetirizine, furosemide , methocarbamol , fluticasone   Labs  CMP: Recent Labs    07/05/24 2345  NA 141  K 3.9  CL 105  CO2 27  BUN 29*  CREATININE 1.1  GLU 99  CALCIUM  8.1*  LABALB 3.1*   CBC and Coags: Recent Labs    07/05/24 2345  WBC 8.4  HGB 12.5*  HCT 37.1*  MCV 84.0  PLT 215   Cardiac Enzymes: No results for input(s): CKTOTAL, CKMB, TROPONINI in the last 72 hours.  Assessment/Plan:  Bruce Ball is a 7 y.o. male with PMHx PE (2023 per wife), CKD3a, Hx of prostrate cancer (01/ 2003, Gleason 3+3, 03-10-2002 s/p radical prostatectomy pelvic lymph node disections, no recurrence), lumbosacral radiculopathy, CAD (CAC on CT), PVCs on Coreg , HTN, GERD, vitamin D  deficiency, essential tremor, Hx Sarcoma (~1970, s/p revision right proximal femur replacement in setting of distal periprosthetic femur fx both components 09/2021), osteoporosis (Tx IV reclast yearly), HLD with Frailty score of 0.17 who [place] with resultant right periprosthetic distal femoral condyle fracture.    #Periprosthetic distal femur fx s/p ORIF 7/3 #Hx Sarcoma (~1970, s/p revision right proximal femur replacement in setting of distal periprosthetic femur fx both components 09/2021) #Pain Management CT Femur R: displaced periprosthetic fracture along the superior medial femoral condyle. Revised R hip prosthesis, now with periprosthetic distal femur fx. Functional status  appears fair; frailty index 0.17. No current cardiac symptoms;  - Multimodal pain management and DVT ppx per ortho - senna + miralax  for bowel regimen  #Chest wall pain, resolved No concerning findings on exam - Trial with lidocaine  patch to chest wall   #Hx PE (2023) On for prior PE (12/2019). INR 1.4, PT 15.6.  - Resumed home apixaban  2.5mg  BID on 7/7 with permission from orthopedics  #HTN BP 194/94 on admission - Continue home amlodipine  10 mg daily and and carvedilol  25 mg BID   CHRONIC/RESOLVED #CKD2  Cr 1.5 on arrival, now stable in 1.1-1.2 range. - CTM  #CAD #PVC On carvedilol  25 mg BID, well tolerated. No CP, SOB, or recent angina. Low suspicion for active cardiac ischemia. - continue home Coreg   #Osteoporosis On annual Reclast. Recent fracture underscores disease severity. - Ensure adequate calcium  + vit D intake post-op. - Consider DEXA recheck post-recovery  #Hx prostrate cancer (01/ 2003, Gleason 3+3, 03-10-2002 s/p radical prostatectomy pelvic lymph node disections, no recurrence) #Lumbosacral radiculopathy #Chronic R Hip Pain Stable. New pain from new fracture.  - continue gabapentin  300 mg nightly - continue duloxetine  60 mg daily - *Condom cath* (not Foley as previously documented) placed perioperatively ~7/3 and maintained due to history of urinary incontinence. Patient wears Depends at home.   #Essential Tremor - continue home BB op  #Incidental findings Multiple predominantly low attenuation masses throughout the liver are mostly subcentimeter with others that range up in size to 3.6 cm x 2.1 cm x 1.7 cm (segment IV). Most are well-defined, but several subcentimeter lesions in the right lobe are faint and poorly defined. 2. Locally enlarged thyroid  with multiple nodules better detected by concurrent chest CT. Largest nodule measures 1.6 cm and is in the left lobe  Pt to follow up with incidental findings with [place]  General:  - Diet/education: Regular diet  - DVT Prophylaxis: apixaban   - Functional Status/Consults: PT/OT  - Lines/Catheters currently in place: PIV; Condom cath (not foley as previously documented)  - Code Status: Full Code  Discharge Planning   Anticipated Discharge Date Medically ready  Anticipated Discharge Location Anticipate SAR in [place]  Barriers Accepting facility   Pan Su Maude Cramp, MD [facility] Medicine Team W [phone]  07/06/2024 12:15 PM EDT  This patient is on [facility] Orthopedic Surgery Internal Medicine Co-Management Team W.  Orthopedic Surgery can be reached at pic [phone] for any questions regarding pain control or the injured/operative extremity. [facility] Team W can be reached at pic [phone] for any questions regarding vital sign abnormalities, blood sugar, urinary retention, constipation, or any other medical concerns.  In the event of a patient emergency, please page [facility] Team W at pic [phone].  Electronically signed by Cramp Caroll Clunes, MD at 07/06/2024  2:52 PM EDT

## (undated) NOTE — Progress Notes (Signed)
 Formatting of this note is different from the original. This patient is on [facility] Orthopedic Surgery Internal Medicine Co-Management Team W.  Orthopedic Surgery can be reached at pic [phone] for any questions regarding pain control or the injured/operative extremity. [facility] Team W can be reached at pic [phone] for any questions regarding vital sign abnormalities, blood sugar, urinary retention, constipation, or any other medical concerns.  In the event of a patient emergency, please page [facility] Team W at pic [phone].  Internal Medicine Progress Note  Service: [facility] Medicine Team [facility] Day: LOS: 10 days  Attending: Caroll Daniel Maude Luke, MD  Subjective / Interval History  No acute events overnight. This morning, the patient feels significantly better than he did yesterday, currently 0 pain. Anxious about his next PT/OT session since he feels like he overexerted himself yesterday.  Objective  Physical Exam (5) 24hour vital signs: Temp:  [36.7 C (98.1 F)-37.2 C (98.9 F)] 37.2 C (98.9 F) Heart Rate:  [63-81] 77 Respirations:  [18] 18 Blood Pressure: (118-143)/(55-74) 128/71  @LASTSAO2 (1)@ Weight: 112.8 kg (248 lb 11.2 oz)   I/O last 3 completed shifts: In: 660 [P.O.:660] Out: 1400 [Urine:1400]  GEN:  NAD, conversant HENT:  NCAT, moist mucus membranes CV:  RRR, no M/R/G PULM:  CTAB, no W/R/R ABD:  Soft, NT/ND, NABS MSK:  R knee immobilizer in place, TTP of R upper chest wall, no bruising noted SKIN:  Bruising/hematoma to R forehead over eye, stable  Medications Scheduled Meds:  apixaban   2.5 mg Oral BID   DULoxetine  DR  60 mg Oral Daily   lidocaine   1 patch Transdermal Q24H   acetaminophen   1,000 mg Oral TID   normal saline  10 mL Intravenous EVERY 8 HOURS   sennosides  2 tablet Oral BID   polyethylene glycol  17 g Oral Daily   moisturizer/plaque preventer  1 each Mouth/Throat BID   gabapentin   300 mg Oral Nightly   AmLODIPine   10 mg Oral Daily   atorvastatin   20 mg Oral  Nightly   carvedilol   25 mg Oral Q12H   cholecalciferol   2,000 Units Oral Daily   pantoprazole  DR  40 mg Oral Daily   Continuous Infusions:  PRN Meds:oxyCODONE  IR, [DISCONTINUED] traMADol  **OR** oxyCODONE  IR, [COMPLETED] Insert Midline Catheter **AND** Maintain Midline Catheter **AND** normal saline **AND** sodium chloride , [COMPLETED] Insert Peripheral IV **AND** Maintain Peripheral IV **AND** normal saline, [COMPLETED] Insert Peripheral IV **AND** Maintain Peripheral IV **AND** normal saline, ondansetron , naloxone, bisacodyl  **OR** bisacodyl , cetirizine, furosemide , methocarbamol , fluticasone   Labs  CMP: Recent Labs    07/05/24 2345  NA 141  K 3.9  CL 105  CO2 27  BUN 29*  CREATININE 1.1  GLU 99  CALCIUM  8.1*  LABALB 3.1*   CBC and Coags: Recent Labs    07/05/24 2345  WBC 8.4  HGB 12.5*  HCT 37.1*  MCV 84.0  PLT 215   Cardiac Enzymes: No results for input(s): CKTOTAL, CKMB, TROPONINI in the last 72 hours.  Assessment/Plan:  Bruce Ball is a 23 y.o. male with PMHx PE (2023 per wife), CKD3a, Hx of prostrate cancer (01/ 2003, Gleason 3+3, 03-10-2002 s/p radical prostatectomy pelvic lymph node disections, no recurrence), lumbosacral radiculopathy, CAD (CAC on CT), PVCs on Coreg , HTN, GERD, vitamin D  deficiency, essential tremor, Hx Sarcoma (~1970, s/p revision right proximal femur replacement in setting of distal periprosthetic femur fx both components 09/2021), osteoporosis (Tx IV reclast yearly), HLD with Frailty score of 0.17 who [place] with resultant right periprosthetic distal femoral condyle fracture.    #Periprosthetic distal femur fx s/p ORIF 7/3 #Hx Sarcoma (~1970, s/p revision right proximal femur replacement in setting of distal periprosthetic femur fx both components 09/2021) #Pain Management CT Femur R: displaced periprosthetic fracture along the superior medial femoral condyle. Revised R hip prosthesis, now with periprosthetic distal  femur fx. Functional status appears fair; frailty index 0.17. No current cardiac symptoms;  - Multimodal pain management and DVT ppx per ortho - senna + miralax  for bowel regimen  #Chest wall pain, resolved No concerning findings on exam - Trial with lidocaine  patch to chest wall   #Hx PE (2023) On for prior PE (12/2019). INR 1.4, PT 15.6.  - Resumed home apixaban  2.5mg  BID on 7/7 with permission from orthopedics  #HTN BP 194/94 on admission - Continue home amlodipine  10 mg daily and and carvedilol  25 mg BID   CHRONIC/RESOLVED #CKD2  Cr 1.5 on arrival, now stable in 1.1-1.2 range. - CTM  #CAD #PVC On carvedilol  25 mg BID, well tolerated. No CP, SOB, or recent angina. Low suspicion for active cardiac ischemia. - continue home Coreg   #Osteoporosis On annual Reclast. Recent fracture underscores disease severity. - Ensure adequate calcium  + vit D intake post-op. - Consider DEXA recheck post-recovery  #Hx prostrate cancer (01/ 2003, Gleason 3+3, 03-10-2002 s/p radical prostatectomy pelvic lymph node disections, no recurrence) #Lumbosacral radiculopathy #Chronic R Hip Pain Stable. New pain from new fracture.  - continue gabapentin  300 mg nightly - continue duloxetine  60 mg daily - *Condom cath* (not Foley as previously documented) placed perioperatively ~7/3 and maintained due to history of urinary incontinence. Patient wears Depends at home.   #Essential Tremor - continue home BB op  #Incidental findings Multiple predominantly low attenuation masses throughout the liver are mostly subcentimeter with others that range up in size to 3.6 cm x 2.1 cm x 1.7 cm (segment IV). Most are well-defined, but several subcentimeter lesions in the right lobe are faint and poorly defined. 2. Locally enlarged thyroid  with multiple nodules better detected by concurrent chest CT. Largest nodule measures 1.6 cm and is in the left lobe  Pt to follow up with incidental findings with primary  providers [place]  General:  - Diet/education: Regular diet  - DVT Prophylaxis: apixaban   - Functional Status/Consults: PT/OT  - Lines/Catheters currently in place: PIV; Condom cath (not foley as previously documented)  - Code Status: Full Code  Discharge Planning   Anticipated Discharge Date Medically ready  Anticipated Discharge Location Anticipate SAR in [place]  Barriers Accepting facility   Pan Su Maude Cramp, MD [facility] Medicine Team W [phone]  07/07/2024 12:15 PM EDT  This patient is on [facility] Orthopedic Surgery Internal Medicine Co-Management Team W.  Orthopedic Surgery can be reached at pic [phone] for any questions regarding pain control or the injured/operative extremity. [facility] Team W can be reached at pic [phone] for any questions regarding vital sign abnormalities, blood sugar, urinary retention, constipation, or any other medical concerns.  In the event of a patient emergency, please page [facility] Team W at pic [phone].  Electronically signed by Cramp Caroll Clunes, MD at 07/07/2024  1:16 PM EDT

## (undated) NOTE — Unmapped External Note (Signed)
 Formatting of this note might be different from the original.  Problem: Pain/Comfort Goal: Adequate pain control while hospitalized Outcome: Continue with Current Goal  Problem: Fall Risk Goal: Patient will remain as independent as possible Outcome: Continue with Current Goal Goal: Patient will have lower fall risk. Lower injury risk. Outcome: Continue with Current Goal Goal: Patient will remain safe from falls and injury Outcome: Continue with Current Goal Goal: Patient/family will understand fall prevention measures Outcome: Continue with Current Goal Goal: Patient/family will understand injury reduction measures Outcome: Continue with Current Goal Goal: Patient/family will comply with fall program Outcome: Continue with Current Goal Goal: Patient/family will verbalize fall prevention strategies to implement after discharge Outcome: Continue with Current Goal  Electronically signed by Dacosta, Jeanine, RN at 07/09/2024  8:29 AM EDT

## (undated) NOTE — Nursing Note (Signed)
 Formatting of this note might be different from the original. Patient is A&Ox4. Patient appeared to be in no signs of distress. Patient has no complaints of pain. AIDET performed. Board updated. Patient bed is in the lowest position with bed alarm turned on. SCD are in use. Plan of care on-going Electronically signed by [name] Rife, RN at 07/02/2024  8:12 PM EDT

## (undated) NOTE — OR Nursing (Signed)
 Formatting of this note might be different from the original. RN and anesthesia interviewed the patient at the bedside in preop. Patient is alert and able to verbalize name, DOB, and surgical procedure. Surgical site is marked on the right leg. Once in the OR, patient was put to sleep on his bed. SCDs and foam pads on lower bilateral extremities. Bair hugger placed and operated by anesthesia.  Electronically signed by [name] Genaro DASEN, RN at 07/01/2024 12:53 PM EDT

## (undated) NOTE — Progress Notes (Signed)
 Formatting of this note is different from the original. Orthopaedic Progress Note  Patient Info: Name: Bruce Ball  Admission Date: 06/27/2024  3:16 PM  MRN: [id number] Hospital Day: Day 14 of ? (NO DRG)   Room/Bed: 5B32/01 DOB: 1946-11-19   Age: 60 y.o.    Interval Update / Subjective  - No acute events overnight.  - No new numbness, tingling, weakness.  - Pain controlled  - Denies Nausea, Vomiting, Diarrhea, Chest pain, and SOB - Dispo pending SAR placement with external facility in [place].  Objective   Temp:  [36.6 C (97.8 F)-36.8 C (98.2 F)] 36.6 C (97.8 F) Heart Rate:  [65-78] 78 Respirations:  [17-18] 17 Blood Pressure: (128-156)/(65-72) 156/72  Labs: Recent Labs  Lab 07/05/24 2345 07/08/24 1100  NA 141 141  K 3.9 4.6  CL 105 105  CO2 27 28  BUN 29* 32*  CREATININE 1.1 1.2  GLU 99 125   Recent Labs  Lab 07/05/24 2345 07/08/24 1100  WBC 8.4 7.2  HGB 12.5* 12.8*  PLT 215 290   No results for input(s): INR in the last 168 hours.  I/O I/O last 3 completed shifts: In: 600 [P.O.:600] Out: 1150 [Urine:1150] No intake/output data recorded.  Gen: NAD, AOx3 Pulm: Non-labored breathing Cards: Regular rate and rhythm   RLE: Dressings c/d/I, HKB @ 20 degrees SILT sp/dp/t/sa/su Motor +ehl/fhl/ta/gsc Compartments soft, compressible Toes wwp, BCR x5 2+ DP pulse  Assessment   Bruce Ball is a 82 y.o. male s/p GLF.  Injury/Diagnosis Complex  - R periprosthetic MFC fx  Surgeries - R femur ORIF on 07/01/24 with Dr. Jackquelyn Salines Problem List Active Hospital Problems   Diagnosis Date Noted   DVT (deep venous thrombosis) (CMS-HCC) 06/28/2024   HTN (hypertension), benign 06/28/2024   Acid reflux 06/28/2024   CHF (congestive heart failure) (CMS-HCC) 06/28/2024   Renal disease 06/28/2024   Closed displaced fracture of condyle of right femur (CMS-HCC) 06/27/2024   Trauma 06/27/2024   Plan  General  -IS - Monitor & replete lytes PRN - BR  Acute  Pain - Multimodal analgesia  - Adjust PRN  RLE - Splints/Brace/Ex-fix: HKB locked at 20  - Drains/wound vacs: None - Surgery(s) complete - Follow-up: 7/22 with TL or at home in [facility] - PT/OT  Current Activity Order (From admission, onward)         Ordered    Activity - Weight Bearing and Restrictions  UNTIL DISCONTINUED       Question Answer Comment  Activity Restrictions Weight Bearing   Activity Restrictions Brace Wear Precautions   Weight bearing status RUE Weight bearing as tolerated   Weight bearing status LUE Weight bearing as tolerated   Weight bearing status RLE Non-weight bearing   Weight bearing status LLE Weight bearing as tolerated   Bracing Wear RLE   Patient to wear RLE brace At all times (specify when to remove) HKB locked @ 20  When to remove RLE brace N/A     07/07/24 1045        PPX - Antibiotics: 24 Hr Periop Ancef  - IP: home Eliquis   - at Discharge: home Eliquis  dose (2.5mg )   Remainder of care per Co-Management Team W  This patient is on [facility] Orthopedic Surgery Internal Medicine Co-Management Team W.  Orthopedic Surgery can be reached at pic [phone] for any questions regarding pain control or the injured/operative extremity. [facility] Team [facility] can be reached at pic [phone] for any questions regarding vital sign abnormalities,  blood sugar, urinary retention, constipation, or any other medical concerns. In the event of a patient emergency, please page [facility] Team W at pic [phone].  Discharge Planning - Medically cleared for DC - Barrier: SAR - DC Location: SAR in [place] - PT/OT: Clearance not needed   Margarie Bras, MD  [facility]    PRIMARY Ortho Trauma or Joints - PIC [phone]   ALL NEW CONSULTS - PIC [phone]  New Orthopaedic consult patients or newly found injuries  CURRENT/EXISTING PATIENTS   Ortho TRAUMA CONSULTS - PIC [phone]    Ortho SPINE - PIC [phone]    Ortho HAND - PIC [phone]    Podiatry - PIC [phone]    Cosigned by Jackquelyn Debby Cough, MD at 07/12/2024  9:11 AM EDT Electronically signed by Bras Margarie Bent, MD at 07/11/2024  6:44 AM EDT Electronically signed by Jackquelyn Debby Cough, MD at 07/12/2024  9:11 AM EDT  Associated attestation - Large, Debby Cough, MD - 07/12/2024  9:11 AM EDT Formatting of this note might be different from the original. I am administratively signing this document.  Debby Cough Jackquelyn, MD

## (undated) NOTE — Progress Notes (Signed)
 Formatting of this note might be different from the original. Physical Therapy  PT monitoring chart. Pt pending OR. Will monitor chart and follow up post operatively.   Electronically signed by: Duard Penta, PT    Electronically signed by Penta Duard, PT at 07/01/2024  7:10 AM EDT

## (undated) NOTE — Progress Notes (Signed)
 Formatting of this note might be different from the original.  Care Management Progress Note  1. Recommendation: TBD (likely SAR)  2. Interventions: patient planned for Sx 7.3, informed PT/OT wife and patient are anticipating SAR, C3 submitted SAR referrals to [facility].  Spoke to wife at the bedside she stated that she would like for the patient to also possibly go to [facility] [phone] awaiting return phone call.   DMA6/Level placed on chart and signed.   3. Barriers: OR, PT/OT, SAR acceptance, auth  4. Outcomes: none  5. Next Steps: C3 will con't to work with Pt/Med team toward a safe d/c plan.  Will need to process DMA6/Level 1  6. Expected Discharge Date: 07/05/2024   Bruce Ball  [phone] Electronically signed by Guillemet, Arayfael, CM,RN at 06/30/2024  4:55 PM EDT

## (undated) NOTE — Unmapped External Note (Signed)
 Formatting of this note is different from the original. Images from the original note were not included. [facility] Consult Patient Name:Bruce Ball Date of Birth:Sep 20, 1946  Age: 48 y.o.  MRN: [id number] Unit/Room/Bed: Unit: 5B  Room: 5B42  Bed: 02  Sex: male Admission Date: 06/27/2024   Wound Care Recommendations for wound located right hand Rinse with saline and pat dry with gauze Apply skin prep #[id number] to wound edges Apply Anasept on xeroform to wound bed (cut to size)  Secure with secured with silicone dressing Dressing changes to be performed by bedside nursing staff every 2 days. If dressings become saturated with drainage, soiled or dislodged, change more frequently.  Document and date dressing(s) accordingly  Date of Evaluation: 06/30/2024 Reason for Visit: skin tear Braden Score: 16 Surface: Foam/gel Diet: Diet NPO time specified Effective Midnight (and additional linked orders)  Presenting Problem: Trauma  History of Present Illness: Bruce Ball is a 58 y.o. male, admitted to [facility] on 06/27/2024 s/p GLF.  Injury/Diagnosis Complex  - R periprosthetic MFC fx  Surgeries - pending OR  Activity:  Current Activity Order (From admission, onward)         Ordered    Activity - Weight Bearing and Restrictions  UNTIL DISCONTINUED       Question Answer Comment  Activity Restrictions Weight Bearing   Weight bearing status RUE Weight bearing as tolerated   Weight bearing status LUE Weight bearing as tolerated   Weight bearing status RLE Non-weight bearing   Weight bearing status LLE Weight bearing as tolerated     06/27/24 2155        Allergies: No Known Allergies History: Past Medical History:  Diagnosis Date   CHF (congestive heart failure) (CMS-HCC)    HTN (hypertension)    Neuropathy    Partial deafness    PE (pulmonary thromboembolism) (CMS-HCC)    Renal disease    No past surgical history on file. Labs: Lab Results   Component Value Date   WBC 6.7 06/29/2024   HGB 13.9 06/29/2024   HCT 42.5 06/29/2024   MCV 86.0 06/29/2024   PLT 156 06/29/2024   No results found for: HGBA1C, HGBA1CMETER  SUBJECTIVE:  Patient agreed to participate in session Pain: Patient - Stated Pain Level: 10 (07/02 1139)      Patient tolerated: fairly well  OBJECTIVE   06/30/24 1323  Wound 06/30/24 Skin tear Hand Right;Lateral  Date First Assessed: 06/30/24   Present on Original Admission: Yes  Wound Type: Skin tear  Location: Hand  Location Orientation: Right;Lateral  Assessment Pink;Partial Thickness  Peri-wound Assessment Fragile  Wound Edges/Margins Defined edges  Wound Bed Pink Non-granular (%) 100%  Wound Length (cm) 1.3 cm  Wound Width (cm) 0.6 cm  Wound Depth (cm) 0.1 cm  Wound Surface Area (cm^2) 0.61 cm^2  Dressing Wound gel;Xeroform;Multilayer silicone foam border dressing  Dressing Status Clean;Dry;Intact  Dressing Intervention New dressing  Drainage Scant  Procedure Non-Selective Debridement;Gauze  Treatment Goal Decrease in size;Maintain moist environment  Wound Image    ASSESSMENT: Bruce Ball is a 44 y.o. male referred to Wound Care for evaluation and treatment recommendations. Signs and symptoms are consistent of skin tear wound and is considered to be a partial thickness wound. Pt wounds present with pink non-granulation tissue and low to no drainage, leading to use of Anasept/Xeroform to maintain moist healing and for microbial management. Do not expect barriers to healing.  Pt will continue to benefit from skilled wound care  for the application of therapeutic methods and techniques to enhance wound perfusion and establish an optimal environment for wound healing by facilitation of cellular changes needed for wound healing. Methods and techniques include debridement, advanced dressing selection, protective and supportive device recommendations and modifications, biophysical agents, and topical  agents.  Surface: foam/gel Mobility: Patient independently moved UE for assessment. Continue frequent repositioning and offloading of bony prominences. Moisture: Patient continent. Nutrition: Currently NPO pending OR. Treatment: Hematoma/bruising noted to right face, no skilled wound care needs, wound care to sign off. Right hand with partial thick skin tear and bruising. Wound with scant drainage, Anasept applied to donate moisture to wound and for microbial coverage. Supplies left at bedside.  Discharge recommendations:  Do not anticipate wound care needs after discharge from hospital  Instructions for Wound/Ostomy Care at Discharge: Wound Instructions (English):        Wound Discharge Instructions  Wound Location: right hand  Gently remove current dressings - you may wet with running water if dressings are adhering to your skin Rinse/clean wound with saline Pat dry with gauze or clean cloth Apply Xeroform  directly to the open wound area Next, apply silicone dressing over your first wound dressing  Change dressing by repeating above steps every day.    Insurance: Payor: [employer] / Plan: [employer] / Product Type: *No Product type* /   Short-term Wound Care goals:  Nursing staff be independent with interval dressing changes and wound care per recommendations, identifying when dressing needs to be changed, Pt will be compliant with dressing changes and offloading , and Wound will remain stable throughout hospitalization as evidenced by no signs of deterioration such as increased necrotic tissue, increased surface area, or signs/ symptoms of infection  Progress towards Goals: Progressing towards goals appropriately  PLAN: Follow-up: 1x/week  Electronically Signed By:  Alan Saba, BSN, RN, [facility] Wound Care Department  Electronically signed by Saba Alan, RN at 06/30/2024  1:30 PM EDT

## (undated) NOTE — Unmapped External Note (Signed)
 Formatting of this note is different from the original.  Problem: DVT (Risk For) Goal: Demonstrates adequate circulation status as evidenced by the following indicators: Peripheral pulses strong/Peripheral pulses symmetrical/ Peripheral edema not present Outcome: Continue with Current Goal  Problem: Knowledge Deficit Goal: Patient will verbalize and/or demonstrate understanding of teaching by discharge Outcome: Continue with Current Goal Goal: Patient will Participate in learning opportunities offered by staff Outcome: Continue with Current Goal  Problem: Infection/Isolation (Risk/Actual) Goal: Patient will be free of hospital acquired infection throughout hospitalization Outcome: Continue with Current Goal  Problem: Pain/Comfort Goal: Adequate pain control while hospitalized Outcome: Continue with Current Goal  Problem: Fall Risk Goal: Patient will remain as independent as possible Outcome: Continue with Current Goal Goal: Patient will have lower fall risk. Lower injury risk. Outcome: Continue with Current Goal Goal: Patient will remain safe from falls and injury Outcome: Continue with Current Goal Goal: Patient/family will understand fall prevention measures Outcome: Continue with Current Goal Goal: Patient/family will understand injury reduction measures Outcome: Continue with Current Goal Goal: Patient/family will comply with fall program Outcome: Continue with Current Goal Goal: Patient/family will verbalize fall prevention strategies to implement after discharge Outcome: Continue with Current Goal  Problem: Skin Alteration (Risk for/Actual) Goal: Patient?s skin integrity will remain intact Outcome: Continue with Current Goal Goal: Patient will evidence no skin breakdown Outcome: Continue with Current Goal Goal: Patient will regain integrity of skin tissue Outcome: Continue with Current Goal  Problem: Anxiety Goal: The patient's care is consistent with the  individualized perioperative plan of care. Outcome: Continue with Current Goal Goal: The patient's right to privacy is maintained. Outcome: Continue with Current Goal Goal: Patient and family demonstrates knowledge of expected response to operative or invasive procedures Outcome: Continue with Current Goal  Problem: Anxiety Goal: The patient's care is consistent with the individualized perioperative plan of care. Outcome: Continue with Current Goal Goal: The patient's right to privacy is maintained. Outcome: Continue with Current Goal Goal: Patient and family demonstrates knowledge of expected response to operative or invasive procedures Outcome: Continue with Current Goal  Problem: Acute pain Goal: The patient demonstrates knowledge of pain management. Outcome: Continue with Current Goal  Problem: Risk for falls Goal: Patient is free from signs and symptoms of injuries Outcome: Continue with Current Goal  Problem: Nausea Goal: The patient receives prescribed medications and solutions, safely administered during the post-op period Outcome: Continue with Current Goal  Problem: Post-Operative Respiratory Complications Goal: Patient Will Remain Free Of Post-Operative Complications Outcome: Continue with Current Goal Goal: Effective Utilization Of Incentive Spirometry Description: INTERVENTIONS: 1. 10 times every hour while awake   2. Document volume every 4 hours Outcome: Continue with Current Goal Goal: Effective Pulmonary Toileting Description: INTERVENTIONS: 1. Cough/deep breath every 2 hours while awake Outcome: Continue with Current Goal Goal: Appropriate Oral Care Maintenance Description: INTERVENTIONS:  1. Oral care every 12 hours Outcome: Continue with Current Goal Goal: Adequate Patient/Family Education Description: INTERVENTIONS: 1. Patient/family education provided  Outcome: Continue with Current Goal Goal: Implementation Of Appropriate Early Mobility  Interventions Description: INTERVENTIONS: 1.  (Acute Care/Med-Surg) Get-Up-And-Go assessment completed, and interventions initiated 2.  (Critical/Intermediate Care) M.O.V.E.S. assessment completed, and interventions initiated Outcome: Continue with Current Goal Goal: Maintenance of HOB Elevation Description: INTERVENTIONS: 1. HOB 30 degrees Outcome: Continue with Current Goal  Problem: Anxiety Goal: The patient's care is consistent with the individualized perioperative plan of care. Outcome: Continue with Current Goal Goal: The patient's right to privacy is maintained. Outcome: Continue with Current Goal  Goal: Patient and family demonstrates knowledge of expected response to operative or invasive procedures Outcome: Continue with Current Goal  Problem: Acute pain Goal: The patient demonstrates knowledge of pain management. Outcome: Continue with Current Goal  Problem: Risk for falls Goal: Patient is free from signs and symptoms of injuries Outcome: Continue with Current Goal  Problem: Nausea Goal: The patient receives prescribed medications and solutions, safely administered during the post-op period Outcome: Continue with Current Goal  Problem: Decreased Gait Goal: Ambulate with least restrictive assistive device Outcome: Continue with Current Goal Goal: Improve balance Outcome: Continue with Current Goal  Problem: Decreased ADL Independence Goal: Improve ADL independence Outcome: Continue with Current Goal  Electronically signed by [name] Rife, RN at 07/12/2024 11:08 PM EDT

## (undated) NOTE — Unmapped External Note (Signed)
 Formatting of this note might be different from the original.  Problem: Inadequate oral food/beverage intake (NI-2.1) Goal: Food and/or Nutrient Delivery Description: Individualized approach for food/nutrient provision. Outcome: Unable to Meet Goal at this Time  Electronically signed by Gennie Lam, DIETETIC TECH at 07/09/2024 12:45 PM EDT

## (undated) NOTE — ED Provider Notes (Signed)
 Formatting of this note is different from the original. EMERGENCY MEDICINE RESIDENT PHYSICIAN NOTE   Patient Identification   Bruce Ball [id number] [birth year]  Patient information was obtained from Patient and Family members and EMS. History/Exam limitations: none. Patient presented to the ED [facility]  Chief Complaint  No chief complaint on file.  History of Present Illness  Bruce Ball is a 6 y.o. male with significant PMH prior R knee surgery, afib and PE on apixaban , presenting to [facility] today [place] the airport.    Denies any associated chest pain, trouble breathing, dizziness, nausea or vomiting.  Primary Lee complaining of pain in his right knee.  AMPLE History: Allergies: NKDA Medications: as above PMH/PSH: as noted above Last tetanus -  unknown    EtOH & Recreational Drugs -  none    Events/Environment History: As noted above in HPI  Primary Survey: Blood pressure (!) 153/112, pulse 68, temperature 36.6 C (97.9 F), resp. rate 17, height 1.803 m (5' 11), weight 113.4 kg (250 lb), SpO2 96%.  Airway:  intact  Breathing: no increased work of breathing & +bilateral breath sounds   Circulation: HR , BP  as above L radial palpable R radial palpable  L DP palpable R DP palpable   GCS: 15  Review of Systems   Negative except as otherwise noted above in HPI.   Past Medical / Surgical History  There are no active problems to display for this patient.  No past medical history on file. No past surgical history on file.  Medications / Allergies    No Known Allergies   Family / Social  No family history on file. Social History   Tobacco Use   Smoking status: Not on file   Smokeless tobacco: Not on file  Substance Use Topics   Alcohol use: Not on file    Physical Exam/Secondary Survey:   Blood pressure (!) 153/112, pulse 68, temperature 36.6 C (97.9 F), resp. rate 17, height 1.803 m (5' 11), weight 113.4 kg (250 lb), SpO2 96%.  Secondary  Survey:  Physical Exam  Constitutional:      General: He is in acute distress.    Appearance: He is normal weight.     Interventions: Cervical collar and backboard in place.  HENT:     Head: Normocephalic.     Comments: R forehead hematoma    Right Ear: External ear normal.     Left Ear: External ear normal.     Nose: Nose normal.     Mouth/Throat:     Mouth: Mucous membranes are moist.     Pharynx: Oropharynx is clear.   Eyes:     Pupils: Pupils are equal, round, and reactive to light.   Cardiovascular:     Rate and Rhythm: Normal rate.     Pulses: Normal pulses.  Pulmonary:     Effort: Pulmonary effort is normal. No respiratory distress.     Breath sounds: No wheezing.  Chest:     Chest wall: No tenderness.  Abdominal:     General: There is protuberance.     Palpations: Abdomen is soft.     Tenderness: There is no abdominal tenderness.   Musculoskeletal:        General: Tenderness (RLE) present. No swelling or deformity. Normal range of motion.     Cervical back: Normal range of motion. No tenderness.   Skin:    General: Skin is warm and dry.     Capillary Refill: Capillary  refill takes less than 2 seconds.   Neurological:     General: No focal deficit present.     Mental Status: He is alert and oriented to person, place, and time. Mental status is at baseline.     GCS: GCS eye subscore is 4. GCS verbal subscore is 5. GCS motor subscore is 6.     Sensory: Sensation is intact.     Motor: Motor function is intact.     Laboratory Data  No results found for this or any previous visit (from the past 24 hours).   Imaging   Imaging Reviewed - No data to display  MDM, Assessment & Plan(s):   06/27/2024 3:30 PM EDT  Patient presenting with the above concerns, history, and physical exam findings. Primary survey: intact Secondary survey: significant for forehead hematoma and right knee tenderness with some swelling, and pain throughout right leg Given significant  mechanism of injury and exam, plan for CT imaging of brain and c-spine to evaluate for underlying intracranial bleeding or intracervical fractures. Plan for CT imaging of chest, abdomen and pelvis to evaluate for underlying vascular, osseus, or hollow organ injury that would necessitate further escalation of care.  Given physical exam of extremities, will get plain films of to evaluate for acute bony injury or significant soft tissue deformities.  given patient fell forward onto his knee and is not likely to attempt to bear weight, even if x-ray negative, will need CT imaging of his knee to evaluate for possible occult tibial plateau fracture or other periprosthetic injury.  Initial Plan: -primary & secondary survey as above -will obtain trauma labs in the event patient needs urgent or emergent surgery -Imaging as above -pain control -Trauma team dispo & additional consults as needed   Remainder ED course as below.  ED course:  ED Course as of 06/28/24 1241  Sun Jun 27, 2024  1640 CREATININE(!): 1.5 Unknown baseline, possible AKI [KE]  1727 CT Brain C Spine wo Contrast FINDINGS/IMPRESSION:  1.    No acute intracranial abnormality. No hemorrhage. 2.    No displaced calvarial or skull base fractures. 3.    Large right frontal scalp hematoma/laceration. 4.     No acute fracture or traumatic malalignment of cervical spine.  [KE]  1737 CT Trauma Chest Abdomen Pelvis w Contrast IMPRESSION:   1.    No acute thoracic, abdominal, or pelvic injury.    2.    Multiple liver masses ranging from subcentimeter to 3.6 cm in size. Most have a nonaggressive appearance that suggests multiple benign cysts or biliary hamartomas. However, malignancy cannot be excluded. If there are no prior studies with which  comparison can document long-term stability, further evaluation with a nonemergent MRI without/with contrast is recommended.  [KE]  1738 CT Knee Right wo Contrast FINDINGS/IMPRESSION:  1.    Acute  comminuted minimally displaced fractures of the medial superior femoral condyle at the osseous hardware junction extending to the anterior cortex trochlea and anterior intercondylar notch. The patellofemoral and knee joints remains aligned.  2.    Questionable nondisplaced fracture of the tibial spine. 3.    Moderate lipohemarthrosis  [KE]  1739 Ortho consult order placed. [KE]  1758 CT Trauma Chest Abdomen Pelvis w Contrast IMPRESSION:  1.    No evidence of acute intracranial injury.  2.    No acute cervical spine fracture.    3.    Goiter. Based on the 1.6 cm largest nodule, further evaluation with a nonemergent ultrasound is  recommended if not already performed. Recommendation(s) based on ACR 2015 White Paper on Managing Incidental Thyroid  Nodules Detected on Imaging.  [KE]  2046 CT Knee Right wo Contrast [RW]   ED Course User Index [KE] Nellie Rickard Ruse, MD [RW] Prentiss Darina CROME., MD     Patient counseled on results of workup including incidental findings of liver masses and goiter.  Admitted to orthopedic surgery for further management.  06/27/2024 3:30 PM EDT  Clinical Impression(s):   Right periprosthetic femoral condyle fracture   Condition:   stable   Disposition:   Admit: To Ortho   Provider: Sherrilee Nellie, MD, PGY3  Supervising Attending as assigned above    Nellie Rickard Ruse, MD Resident 06/28/24 1251  Cosigned by Drusilla Lynwood Lye, MD at 07/01/2024  3:28 PM EDT Electronically signed by Nellie Rickard Ruse, MD at 06/28/2024 12:51 PM EDT Electronically signed by Drusilla Lynwood Lye, MD at 07/01/2024  3:28 PM EDT  Associated attestation - Drusilla Lynwood Lye, MD - 07/01/2024  3:28 PM EDT Formatting of this note might be different from the original. I personally saw and physically examined the patient independent of the resident. Discussed with resident and agree with the resident's assessment and plan of care  documented in the resident's note.  Lynwood Lye Drusilla, MD

## (undated) NOTE — Progress Notes (Signed)
 Formatting of this note is different from the original. DME / DMA Form Completion  Chalmer Zheng Sklar's DMA  paperwork has been completed signed and taken to case management/ social work or placed in the patient's chart. The NCM/SW has been notified as appropriate.  If you have any questions please feel free to call me.  Hosey Arrow, APRN, AGACNP-BC  [facility]    PRIMARY Ortho Trauma or Joints - PIC [phone]   ALL NEW CONSULTS - PIC [phone]  New Orthopaedic consult patients or newly found injuries  CURRENT/EXISTING PATIENTS Ortho TRAUMA CONSULTS - PIC [phone]  Ortho SPINE - PIC [phone]  Ortho HAND - PIC [phone]  Podiatry - PIC [phone]  Electronically signed by Arrow Hosey Borer, NP at 06/30/2024 10:26 AM EDT

## (undated) NOTE — Progress Notes (Signed)
 Formatting of this note is different from the original.  Physical Therapy Progress Note Mobility recommendation: EOB for meals Location:  5B32/01  History this Admission/Hospital Course: Per chart review: Bruce Ball is a 86 y.o.  male  S[place]p GLF at airport negative- Head trauma, negativeLOC. Patient noted immediate pain in the Right knee . Unable to bear weight following injury. Denies numbness, tingling or paresthesias in the Right lower extremity. Denies pain or injury to any other joint or extremity   Diagnosis: 82 year old male admitted on 06/27/2024 s/p fall at airport -  R periprosthetic MFC fx  Surgeries - R femur ORIF on 07/01/24 with Dr. Jackquelyn   Precautions: Falls ; Pt is deaf in R ear Weight Bearing Status: NWB RLE Bracing/Splinting Needs: RLE knee immobilizer at all times Movement Precautions/Spine Clearance: C/T/L spines cleared per trauma tertiary 06/30   PMHx/Relevant Co-morbidities:  Past Medical History   Past Medical History:  Diagnosis Date   CHF (congestive heart failure) (CMS-HCC)     HTN (hypertension)     Neuropathy     Partial deafness     PE (pulmonary thromboembolism) (CMS-HCC)     Renal disease      Past Surgical History   Past Surgical History:  Procedure Laterality Date   PR OPEN TX FEMORAL FRACTURE DISTAL MED/LAT CONDYLE Right 07/01/2024    Preliminary Information: Procedure: ORIF, FRACTURE, FEMUR, DISTAL;  Surgeon: Jackquelyn Debby Cough, MD;  Location: [facility] OR;  Service: Orthopedics    S: Patient agreed to participate in PT.    Pain:  Location: RLE  Intensity: 8/10  Type: dull  Exacerbation: extension, muscle activation, movement  Relief: pain medication, rest breaks RN provided pain meds with PT at bedside  O: Treatment Provided: Functional activity 26 minutes    Communication Ability/Affect/Cognition/Learning Style:  The patient is an elderly Caucasian male of average build  encountered in semi-reclined position. Patient is alert and  oriented x 4. Patient on room air with hinge knee brace on RLE, hematoma on R forehead, LUE IV capped, SCD on RLE. Learning/Coping Styles and Overall Behavior Patterns: follows all verbal commands and is cooperative   Session Events: Environment set up for mobility. Pt able to complete partial heel slide RLE to doff SCD. Therapist removed wedge and pillow from under LLE. MinA supine to sit w/ extended time required, HOB elevated, heavy reliance on bed rails for assistance in trunk flexion/ rotation. Therapist supported RLE while completing transfer and until BLE flat on ground.  SBA forward scooting with multiple repetitions and extended time required for completion.  Seated therapeutic rest break. Static sitting CGA 2/2 spontaneous muscle cramps in RLE. Pt attempted lateral scooting with MinA supporting RLE while patient sequenced upper body and trunk. Max cues for sequencing and encouragement 2/2 multiple repetitions for completion. Extended time required.  Seated therapeutic rest break.  MaxA STS from bed in lowest position and using RW. Max cues for hand placement and BLE positioning. Unable to reach upright position despite cues and x2 reps.  Seated therapeutic rest break. Pt able to laterally flex to R to lay on R elbow and shoulder while hooking L foot under R foot to bring BLE into bed.  MinA for repositioning patient higher in bed and repositioning lower body.  Wedge and pillow placed under RLE. Education provided to avoid pillow placement directly under knee and to place pillow distal to knee (under calf).  All needs in reach, partial heel slide LLE to don SCD, RN notified,  bed alarm on.  Patient left in semi-reclined position, with all needs in reach, bed alarm in place, and 3 bed rails up on standard hospital bed. Nurse notified of patient's response to therapy and status.   Patient on  room air Pt denies lightheadedness, SOB, and dizziness throughout session  A:  Pt continues to  require significant extended time for all activity from sitting EOB. He requires MaxA to attempt to stand; however, unable to reach upright position. He improves ability to laterally scoot with MinA for support to RLE and requires multiple repetitions for completion. He continues to benefit from SAR given increased work of exertion, difficulty progressing ambulation, and requiring heavy assist. Will continue to follow while in hospital.     Discharge Recommendations: SAR   Insurance: Payor: [employer] / Plan: [employer] / Product Type: *No Product type* /   Equipment Needs: Defer to facility Pt currently owns cane, rollator, WC, electric scooter, shower chair  Referrals: Case Management, C3, Child psychotherapist, Occupational Therapy  Patient?s Goal: Not stated    Short Term Physical Therapy Goals:  in 6 visit(s), patient will be able to:  Pt will complete supine to sit MinA Pt will complete forward scooting MiNA Pt will complete lateral scooting MiNA Pt will complete STS ModA LRAD reaching upright posture.  Pt will maintain static sitting balance for 2 minutes without UE support.  Pt to ambulate 20ft LRAD maintaining WB precautions ModA  Long Term Physical Therapy Goals: in 12 visits, patient will be able to: Pt to ambulate 56ft LRAD w/ WB precautions CGA. Pt to complete supine to sit and bilateral rolling SBA Pt will complete forward and scooting SBA Pt will complete STS LRAD MinA reaching upright posture.   Total PT visits: 2  P: continue 3 x week    Time (In/Out): 9061-8995  Electronically Signed By: Duwaine Kiang, PT, DPT Electronically signed by Kiang Duwaine, PT at 07/06/2024  3:31 PM EDT

## (undated) NOTE — Unmapped External Note (Signed)
 Formatting of this note is different from the original.  Problem: DVT (Risk For) Goal: Demonstrates adequate circulation status as evidenced by the following indicators: Peripheral pulses strong/Peripheral pulses symmetrical/ Peripheral edema not present 07/13/2024 0526 by [name] Rife, RN Outcome: Goal Met 07/12/2024 2308 by [name] Rife, RN Outcome: Continue with Current Goal  Problem: Knowledge Deficit Goal: Patient will verbalize and/or demonstrate understanding of teaching by discharge 07/13/2024 0526 by [name] Rife, RN Outcome: Goal Met 07/12/2024 2308 by [name] Rife, RN Outcome: Continue with Current Goal Goal: Patient will Participate in learning opportunities offered by staff 07/13/2024 0526 by [name] Rife, RN Outcome: Goal Met 07/12/2024 2308 by [name] Rife, RN Outcome: Continue with Current Goal  Problem: Infection/Isolation (Risk/Actual) Goal: Patient will be free of hospital acquired infection throughout hospitalization 07/13/2024 0526 by [name] Rife, RN Outcome: Goal Met 07/12/2024 2308 by [name] Rife, RN Outcome: Continue with Current Goal  Problem: Pain/Comfort Goal: Adequate pain control while hospitalized 07/13/2024 0526 by [name] Rife, RN Outcome: Goal Met 07/12/2024 2308 by [name] Rife, RN Outcome: Continue with Current Goal  Problem: Fall Risk Goal: Patient will remain as independent as possible 07/13/2024 0526 by [name] Rife, RN Outcome: Goal Met 07/12/2024 2308 by [name] Rife, RN Outcome: Continue with Current Goal Goal: Patient will have lower fall risk. Lower injury risk. 07/13/2024 0526 by [name] Rife, RN Outcome: Goal Met 07/12/2024 2308 by [name] Rife, RN Outcome: Continue with Current Goal Goal: Patient will remain safe from falls and injury 07/13/2024 0526 by [name] Rife, RN Outcome: Goal Met 07/12/2024 2308 by [name] Rife, RN Outcome: Continue with Current Goal Goal: Patient/family will understand fall prevention measures 07/13/2024 0526 by [name] Rife,  RN Outcome: Goal Met 07/12/2024 2308 by [name] Rife, RN Outcome: Continue with Current Goal Goal: Patient/family will understand injury reduction measures 07/13/2024 0526 by [name] Rife, RN Outcome: Goal Met 07/12/2024 2308 by [name] Rife, RN Outcome: Continue with Current Goal Goal: Patient/family will comply with fall program 07/13/2024 0526 by [name] Rife, RN Outcome: Goal Met 07/12/2024 2308 by [name] Rife, RN Outcome: Continue with Current Goal Goal: Patient/family will verbalize fall prevention strategies to implement after discharge 07/13/2024 0526 by [name] Rife, RN Outcome: Goal Met 07/12/2024 2308 by [name] Rife, RN Outcome: Continue with Current Goal  Problem: Skin Alteration (Risk for/Actual) Goal: Patient?s skin integrity will remain intact 07/13/2024 0526 by [name] Rife, RN Outcome: Goal Met 07/12/2024 2308 by [name] Rife, RN Outcome: Continue with Current Goal Goal: Patient will evidence no skin breakdown 07/13/2024 0526 by [name] Rife, RN Outcome: Goal Met 07/12/2024 2308 by [name] Rife, RN Outcome: Continue with Current Goal Goal: Patient will regain integrity of skin tissue 07/13/2024 0526 by [name] Rife, RN Outcome: Goal Met 07/12/2024 2308 by [name] Rife, RN Outcome: Continue with Current Goal  Problem: Anxiety Goal: The patient's care is consistent with the individualized perioperative plan of care. 07/13/2024 0526 by [name] Rife, RN Outcome: Goal Met 07/12/2024 2308 by [name] Rife, RN Outcome: Continue with Current Goal Goal: The patient's right to privacy is maintained. 07/13/2024 0526 by [name] Rife, RN Outcome: Goal Met 07/12/2024 2308 by [name] Rife, RN Outcome: Continue with Current Goal Goal: Patient and family demonstrates knowledge of expected response to operative or invasive procedures 07/13/2024 0526 by [name] Rife, RN Outcome: Goal Met 07/12/2024 2308 by [name] Rife, RN Outcome: Continue with Current Goal  Problem: Anxiety Goal: The patient's care  is consistent with the individualized perioperative plan of care. 07/13/2024 0526 by [name] Rife, RN Outcome: Goal Met 07/12/2024  2308 by [name] Rife, RN Outcome: Continue with Current Goal Goal: The patient's right to privacy is maintained. 07/13/2024 0526 by [name] Rife, RN Outcome: Goal Met 07/12/2024 2308 by [name] Rife, RN Outcome: Continue with Current Goal Goal: Patient and family demonstrates knowledge of expected response to operative or invasive procedures 07/13/2024 0526 by [name] Rife, RN Outcome: Goal Met 07/12/2024 2308 by [name] Rife, RN Outcome: Continue with Current Goal  Problem: Acute pain Goal: The patient demonstrates knowledge of pain management. 07/13/2024 0526 by [name] Rife, RN Outcome: Goal Met 07/12/2024 2308 by [name] Rife, RN Outcome: Continue with Current Goal  Problem: Risk for falls Goal: Patient is free from signs and symptoms of injuries 07/13/2024 0526 by [name] Rife, RN Outcome: Goal Met 07/12/2024 2308 by [name] Rife, RN Outcome: Continue with Current Goal  Problem: Nausea Goal: The patient receives prescribed medications and solutions, safely administered during the post-op period 07/13/2024 0526 by [name] Rife, RN Outcome: Goal Met 07/12/2024 2308 by [name] Rife, RN Outcome: Continue with Current Goal  Problem: Post-Operative Respiratory Complications Goal: Patient Will Remain Free Of Post-Operative Complications 07/13/2024 0526 by [name] Rife, RN Outcome: Goal Met 07/12/2024 2308 by [name] Rife, RN Outcome: Continue with Current Goal Goal: Effective Utilization Of Incentive Spirometry Description: INTERVENTIONS: 1. 10 times every hour while awake   2. Document volume every 4 hours 07/13/2024 0526 by [name] Rife, RN Outcome: Goal Met 07/12/2024 2308 by [name] Rife, RN Outcome: Continue with Current Goal Goal: Effective Pulmonary Toileting Description: INTERVENTIONS: 1. Cough/deep breath every 2 hours while awake 07/13/2024 0526 by [name] Rife, RN Outcome: Goal Met 07/12/2024 2308 by [name] Rife, RN Outcome: Continue with Current Goal Goal: Appropriate Oral Care Maintenance Description: INTERVENTIONS:  1. Oral care every 12 hours 07/13/2024 0526 by [name] Rife, RN Outcome: Goal Met 07/12/2024 2308 by [name] Rife, RN Outcome: Continue with Current Goal Goal: Adequate Patient/Family Education Description: INTERVENTIONS: 1. Patient/family education provided  07/13/2024 0526 by [name] Rife, RN Outcome: Goal Met 07/12/2024 2308 by [name] Rife, RN Outcome: Continue with Current Goal Goal: Implementation Of Appropriate Early Mobility Interventions Description: INTERVENTIONS: 1.  (Acute Care/Med-Surg) Get-Up-And-Go assessment completed, and interventions initiated 2.  (Critical/Intermediate Care) M.O.V.E.S. assessment completed, and interventions initiated 07/13/2024 0526 by [name] Rife, RN Outcome: Goal Met 07/12/2024 2308 by [name] Rife, RN Outcome: Continue with Current Goal Goal: Maintenance of HOB Elevation Description: INTERVENTIONS: 1. HOB 30 degrees 07/13/2024 0526 by [name] Rife, RN Outcome: Goal Met 07/12/2024 2308 by [name] Rife, RN Outcome: Continue with Current Goal  Problem: Anxiety Goal: The patient's care is consistent with the individualized perioperative plan of care. 07/13/2024 0526 by [name] Rife, RN Outcome: Goal Met 07/12/2024 2308 by [name] Rife, RN Outcome: Continue with Current Goal Goal: The patient's right to privacy is maintained. 07/13/2024 0526 by [name] Rife, RN Outcome: Goal Met 07/12/2024 2308 by [name] Rife, RN Outcome: Continue with Current Goal Goal: Patient and family demonstrates knowledge of expected response to operative or invasive procedures 07/13/2024 0526 by [name] Rife, RN Outcome: Goal Met 07/12/2024 2308 by [name] Rife, RN Outcome: Continue with Current Goal  Problem: Acute pain Goal: The patient demonstrates knowledge of pain management. 07/13/2024 0526 by [name] Rife,  RN Outcome: Goal Met 07/12/2024 2308 by [name] Rife, RN Outcome: Continue with Current Goal  Problem: Risk for falls Goal: Patient is free from signs and symptoms of injuries 07/13/2024 0526 by [name] Rife, RN Outcome: Goal Met 07/12/2024 2308 by [name] Rife, RN Outcome: Continue with Current Goal  Problem:  Nausea Goal: The patient receives prescribed medications and solutions, safely administered during the post-op period 07/13/2024 0526 by [name] Rife, RN Outcome: Goal Met 07/12/2024 2308 by [name] Rife, RN Outcome: Continue with Current Goal  Problem: Decreased Gait Goal: Ambulate with least restrictive assistive device 07/13/2024 0526 by [name] Rife, RN Outcome: Goal Met 07/12/2024 2308 by [name] Rife, RN Outcome: Continue with Current Goal Goal: Improve balance 07/13/2024 0526 by [name] Rife, RN Outcome: Goal Met 07/12/2024 2308 by [name] Rife, RN Outcome: Continue with Current Goal  Problem: Decreased ADL Independence Goal: Improve ADL independence 07/13/2024 0526 by [name] Rife, RN Outcome: Goal Met 07/12/2024 2308 by [name] Rife, RN Outcome: Continue with Current Goal  Problem: Fall (Risk For/Post Fall) Goal: Patient will verbalize understanding of fall prevention. Outcome: Goal Met Goal: Patient will suffer no injury from falls while hospitalized Outcome: Goal Met Goal: Patient will be maintained in a safe and restraint free environment throughout hospitalization Outcome: Goal Met Goal: Patient will have no repeat fall(s) Outcome: Goal Met  Electronically signed by [name] Rife, RN at 07/13/2024  5:36 AM EDT

## (undated) NOTE — Unmapped External Note (Signed)
 Formatting of this note is different from the original.  Problem: DVT (Risk For) Goal: Demonstrates adequate circulation status as evidenced by the following indicators: Peripheral pulses strong/Peripheral pulses symmetrical/ Peripheral edema not present Outcome: Continue with Current Goal  Problem: Knowledge Deficit Goal: Patient will verbalize and/or demonstrate understanding of teaching by discharge Outcome: Continue with Current Goal Goal: Patient will Participate in learning opportunities offered by staff Outcome: Continue with Current Goal  Problem: Infection/Isolation (Risk/Actual) Goal: Patient will be free of hospital acquired infection throughout hospitalization Outcome: Continue with Current Goal  Problem: Pain/Comfort Goal: Adequate pain control while hospitalized Outcome: Continue with Current Goal  Problem: Fall Risk Goal: Patient will remain as independent as possible Outcome: Continue with Current Goal Goal: Patient will have lower fall risk. Lower injury risk. Outcome: Continue with Current Goal Goal: Patient will remain safe from falls and injury Outcome: Continue with Current Goal Goal: Patient/family will understand fall prevention measures Outcome: Continue with Current Goal Goal: Patient/family will understand injury reduction measures Outcome: Continue with Current Goal Goal: Patient/family will comply with fall program Outcome: Continue with Current Goal Goal: Patient/family will verbalize fall prevention strategies to implement after discharge Outcome: Continue with Current Goal  Problem: Skin Alteration (Risk for/Actual) Goal: Patient?s skin integrity will remain intact Outcome: Continue with Current Goal Goal: Patient will evidence no skin breakdown Outcome: Continue with Current Goal Goal: Patient will regain integrity of skin tissue Outcome: Continue with Current Goal  Problem: Anxiety Goal: The patient's care is consistent with the  individualized perioperative plan of care. Outcome: Continue with Current Goal Goal: The patient's right to privacy is maintained. Outcome: Continue with Current Goal Goal: Patient and family demonstrates knowledge of expected response to operative or invasive procedures Outcome: Continue with Current Goal  Problem: Anxiety Goal: The patient's care is consistent with the individualized perioperative plan of care. Outcome: Continue with Current Goal Goal: The patient's right to privacy is maintained. Outcome: Continue with Current Goal Goal: Patient and family demonstrates knowledge of expected response to operative or invasive procedures Outcome: Continue with Current Goal  Problem: Acute pain Goal: The patient demonstrates knowledge of pain management. Outcome: Continue with Current Goal  Problem: Risk for falls Goal: Patient is free from signs and symptoms of injuries Outcome: Continue with Current Goal  Problem: Nausea Goal: The patient receives prescribed medications and solutions, safely administered during the post-op period Outcome: Continue with Current Goal  Problem: Anxiety Goal: The patient's care is consistent with the individualized perioperative plan of care. Outcome: Continue with Current Goal Goal: The patient's right to privacy is maintained. Outcome: Continue with Current Goal Goal: Patient and family demonstrates knowledge of expected response to operative or invasive procedures Outcome: Continue with Current Goal  Problem: Acute pain Goal: The patient demonstrates knowledge of pain management. Outcome: Continue with Current Goal  Problem: Risk for falls Goal: Patient is free from signs and symptoms of injuries Outcome: Continue with Current Goal  Problem: Nausea Goal: The patient receives prescribed medications and solutions, safely administered during the post-op period Outcome: Continue with Current Goal  Problem: Post-Operative Respiratory  Complications Goal: Patient Will Remain Free Of Post-Operative Complications Outcome: Continue with Current Goal Goal: Effective Utilization Of Incentive Spirometry Description: INTERVENTIONS: 1. 10 times every hour while awake   2. Document volume every 4 hours Outcome: Continue with Current Goal Goal: Effective Pulmonary Toileting Description: INTERVENTIONS: 1. Cough/deep breath every 2 hours while awake Outcome: Continue with Current Goal Goal: Appropriate Oral Care Maintenance Description: INTERVENTIONS:  1. Oral care every 12 hours Outcome: Continue with Current Goal Goal: Adequate Patient/Family Education Description: INTERVENTIONS: 1. Patient/family education provided  Outcome: Continue with Current Goal Goal: Implementation Of Appropriate Early Mobility Interventions Description: INTERVENTIONS: 1.  (Acute Care/Med-Surg) Get-Up-And-Go assessment completed, and interventions initiated 2.  (Critical/Intermediate Care) M.O.V.E.S. assessment completed, and interventions initiated Outcome: Continue with Current Goal Goal: Maintenance of HOB Elevation Description: INTERVENTIONS: 1. HOB 30 degrees Outcome: Continue with Current Goal  Problem: Decreased Gait Goal: Ambulate with least restrictive assistive device Outcome: Continue with Current Goal Goal: Improve balance Outcome: Continue with Current Goal  Problem: Decreased ADL Independence Goal: Improve ADL independence Outcome: Continue with Current Goal  Electronically signed by Anderson Connor, RN at 07/12/2024 12:53 PM EDT

## (undated) NOTE — Progress Notes (Signed)
 Formatting of this note is different from the original. This patient is on [facility] Orthopedic Surgery Internal Medicine Co-Management Team W.  Orthopedic Surgery can be reached at pic [phone] for any questions regarding pain control or the injured/operative extremity. [facility] Team W can be reached at pic [phone] for any questions regarding vital sign abnormalities, blood sugar, urinary retention, constipation, or any other medical concerns.  In the event of a patient emergency, please page [facility] Team W at pic [phone].  Internal Medicine Progress Note  Service: [facility] Medicine Team [facility] Day: LOS: 5 days  Attending: Almarie Gwen Sharps, MD, MD  Subjective / Interval History   No acute events overnight. S/p R femur ORIF POD#1. PT at bedside preparing to work with pt. Currently denies any CP/SOB.   Wife at bedside. Discussed incidental findings with pt and wife.  Thyroid  goiter and thyroid  nodules. Known finding by pt. Currently being followed by [facility] Multiple liver nodules on CT trauma scan. On chart review, pt with multiple tiny liver nodules at least as far back as 2018. Of note, current imaging notable for some larger nodules, measuring up to 3.6cm. Wife/patient not aware of h/o liver nodules. Discussed non-emergent abdominal MRI w/wo contrast for further evaluation. Will plan for outpt follow up given pt will be returning [place]   Objective  Physical Exam (5) 24hour vital signs: Temp:  [36.6 C (97.9 F)-37.4 C (99.3 F)] 36.8 C (98.3 F) Heart Rate:  [70-98] 75 Respirations:  [11-20] 18 Blood Pressure: (120-176)/(60-102) 153/70  @LASTSAO2 (1)@ Weight: 112.8 kg (248 lb 11.2 oz)   I/O last 3 completed shifts: In: 980 [P.O.:250; I.V.:720; IV Piggyback:10] Out: 50 [Blood:50]  GEN:  NAD, conversant HENT:  NCAT, moist mucus membranes CV:  RRR, no M/R/G PULM:  CTAB, no W/R/R ABD:  Soft, NT/ND, NABS MSK:  R knee immobilizer in place SKIN:  Bruising to R forehead over eye    Medications Scheduled Meds:  acetaminophen   1,000 mg Oral TID   normal saline  10 mL Intravenous EVERY 8 HOURS   Enoxaprin [place] inj for VTE Prophylaxis (HIGH risk for VTE)  30 mg Subcutaneous Q12H SCH   sennosides  2 tablet Oral BID   polyethylene glycol  17 g Oral Daily   moisturizer/plaque preventer  1 each Mouth/Throat BID   gabapentin   300 mg Oral Nightly   AmLODIPine   10 mg Oral Daily   [Held by Provider] apixaban   2.5 mg Oral BID   atorvastatin   20 mg Oral Nightly   carvedilol   25 mg Oral Q12H   cholecalciferol   2,000 Units Oral Daily   pantoprazole  DR  40 mg Oral Daily   DULoxetine  DR  30 mg Oral Daily   Continuous Infusions:  PRN Meds:[COMPLETED] Insert Midline Catheter **AND** Maintain Midline Catheter **AND** normal saline **AND** sodium chloride , [COMPLETED] Insert Peripheral IV **AND** Maintain Peripheral IV **AND** normal saline, [COMPLETED] Insert Peripheral IV **AND** Maintain Peripheral IV **AND** normal saline, ondansetron , traMADol  **OR** oxyCODONE  IR, naloxone, bisacodyl  **OR** bisacodyl , cetirizine, furosemide , methocarbamol , fluticasone   Labs  CMP: Recent Labs    06/29/24 2321 07/02/24 0122  NA 139 138  K 3.9 4.1  CL 107 103  CO2 27 25  BUN 20 22  CREATININE 1.2 1.1  GLU 91 165*  CALCIUM  8.4* 8.1*  LABALB 3.4* 3.6   CBC and Coags: Recent Labs    06/29/24 2321 07/02/24 0122  WBC 6.7 6.7  HGB 13.9 13.9  HCT 42.5 41.2  MCV  86.0 84.0  PLT 156 202  INR 1.1*  --    Cardiac Enzymes: No results for input(s): CKTOTAL, CKMB, TROPONINI in the last 72 hours.  Assessment/Plan:  Bruce Ball is a 37 y.o. male with PMHx PE (12/2019), CKD3a, Hx of prostrate cancer (01/ 2003, Gleason 3+3, 03-10-2002 s/p radical prostatectomy pelvic lymph node disections, no recurrence), lumbosacral radiculopathy, CAD (CAC on CT), PVCs on Coreg , HTN, GERD, vitamin D  deficiency, essential tremor, Hx Sarcoma (~1970, s/p revision right proximal femur replacement in  setting of distal periprosthetic femur fx both components 09/2021), osteoporosis (Tx IV reclast yearly), HLD with Frailty score of 0.17 who [place] with [place] right periprosthetic distal femoral condyle fracture.    #Pre-op Risk Stratification & Medical Optimization #Periprosthetic distal femur fx s/p ORIF 7/3 #Hx Sarcoma (~1970, s/p revision right proximal femur replacement in setting of distal periprosthetic femur fx both components 09/2021) #Pain Management CT Femur R: displaced periprosthetic fracture along the superior medial femoral condyle. Revised R hip prosthesis, now with periprosthetic distal femur fx. Functional status appears fair; frailty index 0.17. No current cardiac symptoms;  - surgical management per ortho - RCRI = 1 (Hx CAD); low risk for proposed surgery - cont on HYDROmorphone  PRN + methocarbamol  PRN. - Pain management per ortho - Tylenol  1g TID PRN - senna + miralax  for bowel regimen   #Hx PE (2023) On for prior PE (12/2019). INR 1.4, PT 15.6.  - hold apixaban  pre-op; coordinate timing with anesthesia and ortho. No bridging indicated if low to moderate risk. - Resume post-op when hemostasis secure. No recent VTE.  #HTN BP 194/94 on admission; patient on amlodipine  10 mg daily and and carvedilol  25 mg BID  - Continue amlodipine  and coreg   #CKD3a (Cr 1.5, baseline per chart) Labs otherwise stable (K 4.5, CO2 24, BUN 20) - Avoid nephrotoxins (NSAIDs, contrast) - Ensure adequate volume status pre/post-op  #CAD #PVC On carvedilol  25 mg BID, well tolerated. No CP, SOB, or recent angina. Low suspicion for active cardiac ischemia. - No further cardiac work-up indicated unless new symptoms emerge. - Coreg  held on admission, however, no need to hold prei-op BB in pt who is on long standing, stable dose and stable vitals -Continue Coreg   #Osteoporosis On annual Reclast. Recent fracture underscores disease severity. - Ensure adequate calcium  + vit D  intake post-op. - Consider DEXA recheck post-recovery  #Hx prostrate cancer (01/ 2003, Gleason 3+3, 03-10-2002 s/p radical prostatectomy pelvic lymph node disections, no recurrence) #Lumbosacral radiculopathy #Chronic R Hip Pain Stable. New pain from new fracture.  - continue gabapentin  300 mg nightly - continue duloxetine  30 mg daily  #Essential Tremor - continue home BB post-op  #Incidental findings Multiple predominantly low attenuation masses throughout the liver are mostly subcentimeter with others that range up in size to 3.6 cm x 2.1 cm x 1.7 cm (segment IV). Most are well-defined, but several subcentimeter lesions in the right lobe are faint and poorly defined. 2. Locally enlarged thyroid  with multiple nodules better detected by concurrent chest CT. Largest nodule measures 1.6 cm and is in the left lobe  Pt to follow up with incidental findings with primary providers [place]  General:  - Diet/education: NPO--> Regular diet  - DVT Prophylaxis: Lovenox   - Functional Status/Consults: PT/OT  - Lines/Catheters currently in place: PIV  - Code Status: Full Code  Discharge Planning   Anticipated Discharge Date TBD  Anticipated Discharge Location Anticipate SAR in [place]  Barriers PT/OT  clearance / Accepting facility   Almarie Gwen Sharps, MD [facility] Medicine Team W [phone]  07/02/2024 12:15 PM EDT  This patient is on [facility] Orthopedic Surgery Internal Medicine Co-Management Team W.  Orthopedic Surgery can be reached at pic [phone] for any questions regarding pain control or the injured/operative extremity. [facility] Team W can be reached at pic [phone] for any questions regarding vital sign abnormalities, blood sugar, urinary retention, constipation, or any other medical concerns.  In the event of a patient emergency, please page [facility] Team W at pic [phone].  Electronically signed by Sharps Almarie Gwen, MD at 07/02/2024  3:08 PM EDT

## (undated) NOTE — Progress Notes (Signed)
 Formatting of this note is different from the original. BRIEF ORTHO UPDATE:  Patient seen laying in bed resting comfortably. Pain overall controlled. Wife present at bedside. Patient was seen and evaluated by PT/OT today, who state they are recommending SAR for discharge dispo. CM aware that patient and wife would like to have SAR options locally in [place].   HKB RLE in place. Length was adjusted and patient reported more comfortable. NVI.  All questions were answered and patient/wife verbalized understanding.  Jacqueline M[facility]    PRIMARY Ortho Trauma or Joints - PIC [phone]   ALL NEW CONSULTS - PIC [phone]  New Orthopaedic consult patients or newly found injuries  CURRENT/EXISTING PATIENTS Ortho TRAUMA CONSULTS - PIC [phone]  Ortho SPINE - PIC [phone]  Ortho HAND - PIC [phone]  Podiatry - PIC [phone]  Electronically signed by Janit Arlyne HERO, PA-C at 07/02/2024 12:32 PM EDT

## (undated) NOTE — Progress Notes (Signed)
 Formatting of this note is different from the original. This patient is on [facility] Orthopedic Surgery Internal Medicine Co-Management Team W.  Orthopedic Surgery can be reached at pic [phone] for any questions regarding pain control or the injured/operative extremity. [facility] Team W can be reached at pic [phone] for any questions regarding vital sign abnormalities, blood sugar, urinary retention, constipation, or any other medical concerns.  In the event of a patient emergency, please page [facility] Team W at pic [phone].  Internal Medicine Progress Note  Service: [facility] Medicine Team [facility] Day: LOS: 8 days  Attending: Almarie Gwen Sharps, MD, MD  Subjective / Interval History  No acute events overnight. This morning, the patient continues to feel well. No new symptoms. Patient's wife updated at bedside, and we called daughter (who is a PharmD) per their request. Daughter had requested that we avoid tramadol  due to renal insufficiency and concern that it could be causing some delirium (I informed her that there was no tramadol  currently ordered), and informed me that his home dose of Cymbalta  is 60mg  (patient had started taking 30mg  weeks prior to admission) and asked if we could increase back to 60mg  to help with pain control, which I stated I would do after confirming 60mg  was still appropriate for renal dosing (which it is).  Otherwise just awaiting placement. They are eager to speak to CM/SW today after awaiting a holiday weekend.  Objective  Physical Exam (5) 24hour vital signs: Temp:  [36.6 C (97.8 F)-37.3 C (99.1 F)] 36.7 C (98.1 F) Heart Rate:  [64-87] 70 Respirations:  [16-18] 18 Blood Pressure: (134-171)/(56-80) 143/77  @LASTSAO2 (1)@ Weight: 112.8 kg (248 lb 11.2 oz)   I/O last 3 completed shifts: In: 236 [P.O.:236] Out: 1840 [Urine:1840]  GEN:  NAD, conversant HENT:  NCAT, moist mucus membranes CV:  RRR, no M/R/G PULM:  CTAB, no W/R/R ABD:  Soft, NT/ND, NABS MSK:  R  knee immobilizer in place, TTP of R upper chest wall, no bruising noted SKIN:  Bruising/hematoma to R forehead over eye, stable  Medications Scheduled Meds:  apixaban   2.5 mg Oral BID   DULoxetine  DR  60 mg Oral Daily   lidocaine   1 patch Transdermal Q24H   acetaminophen   1,000 mg Oral TID   normal saline  10 mL Intravenous EVERY 8 HOURS   sennosides  2 tablet Oral BID   polyethylene glycol  17 g Oral Daily   moisturizer/plaque preventer  1 each Mouth/Throat BID   gabapentin   300 mg Oral Nightly   AmLODIPine   10 mg Oral Daily   atorvastatin   20 mg Oral Nightly   carvedilol   25 mg Oral Q12H   cholecalciferol   2,000 Units Oral Daily   pantoprazole  DR  40 mg Oral Daily   Continuous Infusions:  PRN Meds:oxyCODONE  IR, [DISCONTINUED] traMADol  **OR** oxyCODONE  IR, [COMPLETED] Insert Midline Catheter **AND** Maintain Midline Catheter **AND** normal saline **AND** sodium chloride , [COMPLETED] Insert Peripheral IV **AND** Maintain Peripheral IV **AND** normal saline, [COMPLETED] Insert Peripheral IV **AND** Maintain Peripheral IV **AND** normal saline, ondansetron , naloxone, bisacodyl  **OR** bisacodyl , cetirizine, furosemide , methocarbamol , fluticasone   Labs  CMP: Recent Labs    07/03/24 0627  NA 141  K 3.8  CL 105  CO2 28  BUN 26*  CREATININE 1.2  GLU 102  CALCIUM  8.2*  MG 2.0  BILITOT 1.1  BILIDIR 0.2  ALKPHOS 54  AST 23  ALT 10*  PROT 5.7*  LABALB 3.6   CBC and Coags: Recent Labs  07/03/24 0627  WBC 9.6  HGB 13.4  HCT 39.3*  MCV 84.0  PLT 199   Cardiac Enzymes: No results for input(s): CKTOTAL, CKMB, TROPONINI in the last 72 hours.  Assessment/Plan:  Bruce Ball is a 24 y.o. male with PMHx PE (2023 per wife), CKD3a, Hx of prostrate cancer (01/ 2003, Gleason 3+3, 03-10-2002 s/p radical prostatectomy pelvic lymph node disections, no recurrence), lumbosacral radiculopathy, CAD (CAC on CT), PVCs on Coreg , HTN, GERD, vitamin D  deficiency, essential tremor,  Hx Sarcoma (~1970, s/p revision right proximal femur replacement in setting of distal periprosthetic femur fx both components 09/2021), osteoporosis (Tx IV reclast yearly), HLD with Frailty score of 0.17 who [place] with resultant right periprosthetic distal femoral condyle fracture.    #Periprosthetic distal femur fx s/p ORIF 7/3 #Hx Sarcoma (~1970, s/p revision right proximal femur replacement in setting of distal periprosthetic femur fx both components 09/2021) #Pain Management CT Femur R: displaced periprosthetic fracture along the superior medial femoral condyle. Revised R hip prosthesis, now with periprosthetic distal femur fx. Functional status appears fair; frailty index 0.17. No current cardiac symptoms;  - Multimodal pain management and DVT ppx per ortho - senna + miralax  for bowel regimen  #Chest wall pain, resolved No concerning findings on exam - Trial with lidocaine  patch to chest wall   #Hx PE (2023) On for prior PE (12/2019). INR 1.4, PT 15.6.  - Resumed home apixaban  2.5mg  BID on 7/7 with permission from orthopedics  #HTN BP 194/94 on admission - Continue home amlodipine  10 mg daily and and carvedilol  25 mg BID   CHRONIC/RESOLVED #CKD2  Cr 1.5 on arrival, now stable in 1.1-1.2 range. - CTM  #CAD #PVC On carvedilol  25 mg BID, well tolerated. No CP, SOB, or recent angina. Low suspicion for active cardiac ischemia. - continue home Coreg   #Osteoporosis On annual Reclast. Recent fracture underscores disease severity. - Ensure adequate calcium  + vit D intake post-op. - Consider DEXA recheck post-recovery  #Hx prostrate cancer (01/ 2003, Gleason 3+3, 03-10-2002 s/p radical prostatectomy pelvic lymph node disections, no recurrence) #Lumbosacral radiculopathy #Chronic R Hip Pain Stable. New pain from new fracture.  - continue gabapentin  300 mg nightly - continue duloxetine  60 mg daily - Foley placed perioperatively ~7/3 and maintained due to history of  urinary incontinence. Patient wears Depends at home. May trial TOV prior to discharge  #Essential Tremor - continue home BB op  #Incidental findings Multiple predominantly low attenuation masses throughout the liver are mostly subcentimeter with others that range up in size to 3.6 cm x 2.1 cm x 1.7 cm (segment IV). Most are well-defined, but several subcentimeter lesions in the right lobe are faint and poorly defined. 2. Locally enlarged thyroid  with multiple nodules better detected by concurrent chest CT. Largest nodule measures 1.6 cm and is in the left lobe  Pt to follow up with incidental findings with primary providers [place]  General:  - Diet/education: Regular diet  - DVT Prophylaxis: apixaban   - Functional Status/Consults: PT/OT  - Lines/Catheters currently in place: PIV; Foley catheter placed ~7/3  - Code Status: Full Code  Discharge Planning   Anticipated Discharge Date TBD  Anticipated Discharge Location Anticipate SAR in [place]  Barriers PT/OT clearance / Accepting facility   Pan Su Maude Cramp, MD [facility] Medicine Team W [phone]  07/05/2024 12:15 PM EDT  This patient is on [facility] Orthopedic Surgery Internal Medicine Co-Management Team W.  Orthopedic Surgery can be reached at  pic [phone] for any questions regarding pain control or the injured/operative extremity. [facility] Team W can be reached at pic [phone] for any questions regarding vital sign abnormalities, blood sugar, urinary retention, constipation, or any other medical concerns.  In the event of a patient emergency, please page [facility] Team W at pic [phone].  Electronically signed by Luke Caroll Clunes, MD at 07/05/2024  2:49 PM EDT

## (undated) NOTE — Nursing Note (Signed)
 Formatting of this note might be different from the original. 1913- Patient handoff received from [facility] . Patient is AOx4, saturating well on room air. NAD observed. All safety precautions observed. SCD on. Bed alarm on. AIDET board updated. Bed in lowest position with call light and personal belongings in reach.  Electronically signed by Mardy Lema, RN at 07/06/2024  8:03 PM EDT

## (undated) NOTE — Unmapped External Note (Signed)
 Formatting of this note might be different from the original.  Problem: Decreased ADL Independence Goal: Improve ADL independence Outcome: New Goal Established  Please refer to my progress note for details regarding current functional mobility and OT goals/POC  Armin Pinal OTR/L   Electronically signed by Pinal Armin, OT at 07/06/2024 12:07 PM EDT

## (undated) NOTE — Unmapped External Note (Signed)
 Formatting of this note is different from the original.  Problem: DVT (Risk For) Goal: Demonstrates adequate circulation status as evidenced by the following indicators: Peripheral pulses strong/Peripheral pulses symmetrical/ Peripheral edema not present Outcome: Continue with Current Goal  Problem: Knowledge Deficit Goal: Patient will verbalize and/or demonstrate understanding of teaching by discharge Outcome: Continue with Current Goal Goal: Patient will Participate in learning opportunities offered by staff Outcome: Continue with Current Goal  Problem: Infection/Isolation (Risk/Actual) Goal: Patient will be free of hospital acquired infection throughout hospitalization Outcome: Continue with Current Goal  Problem: Pain/Comfort Goal: Adequate pain control while hospitalized Outcome: Continue with Current Goal  Problem: Fall Risk Goal: Patient will remain as independent as possible Outcome: Continue with Current Goal Goal: Patient will have lower fall risk. Lower injury risk. Outcome: Continue with Current Goal Goal: Patient will remain safe from falls and injury Outcome: Continue with Current Goal Goal: Patient/family will understand fall prevention measures Outcome: Continue with Current Goal Goal: Patient/family will understand injury reduction measures Outcome: Continue with Current Goal Goal: Patient/family will comply with fall program Outcome: Continue with Current Goal Goal: Patient/family will verbalize fall prevention strategies to implement after discharge Outcome: Continue with Current Goal  Problem: Skin Alteration (Risk for/Actual) Goal: Patient?s skin integrity will remain intact Outcome: Continue with Current Goal Goal: Patient will evidence no skin breakdown Outcome: Continue with Current Goal Goal: Patient will regain integrity of skin tissue Outcome: Continue with Current Goal  Problem: Anxiety Goal: The patient's care is consistent with the  individualized perioperative plan of care. Outcome: Continue with Current Goal Goal: The patient's right to privacy is maintained. Outcome: Continue with Current Goal Goal: Patient and family demonstrates knowledge of expected response to operative or invasive procedures Outcome: Continue with Current Goal  Problem: Anxiety Goal: The patient's care is consistent with the individualized perioperative plan of care. Outcome: Continue with Current Goal Goal: The patient's right to privacy is maintained. Outcome: Continue with Current Goal Goal: Patient and family demonstrates knowledge of expected response to operative or invasive procedures Outcome: Continue with Current Goal  Problem: Acute pain Goal: The patient demonstrates knowledge of pain management. Outcome: Continue with Current Goal  Problem: Risk for falls Goal: Patient is free from signs and symptoms of injuries Outcome: Continue with Current Goal  Problem: Nausea Goal: The patient receives prescribed medications and solutions, safely administered during the post-op period Outcome: Continue with Current Goal  Problem: Post-Operative Respiratory Complications Goal: Patient Will Remain Free Of Post-Operative Complications Outcome: Continue with Current Goal Goal: Effective Utilization Of Incentive Spirometry Description: INTERVENTIONS: 1. 10 times every hour while awake   2. Document volume every 4 hours Outcome: Continue with Current Goal Goal: Effective Pulmonary Toileting Description: INTERVENTIONS: 1. Cough/deep breath every 2 hours while awake Outcome: Continue with Current Goal Goal: Appropriate Oral Care Maintenance Description: INTERVENTIONS:  1. Oral care every 12 hours Outcome: Continue with Current Goal Goal: Adequate Patient/Family Education Description: INTERVENTIONS: 1. Patient/family education provided  Outcome: Continue with Current Goal Goal: Implementation Of Appropriate Early Mobility  Interventions Description: INTERVENTIONS: 1.  (Acute Care/Med-Surg) Get-Up-And-Go assessment completed, and interventions initiated 2.  (Critical/Intermediate Care) M.O.V.E.S. assessment completed, and interventions initiated Outcome: Continue with Current Goal Goal: Maintenance of HOB Elevation Description: INTERVENTIONS: 1. HOB 30 degrees Outcome: Continue with Current Goal  Problem: Anxiety Goal: The patient's care is consistent with the individualized perioperative plan of care. Outcome: Continue with Current Goal Goal: The patient's right to privacy is maintained. Outcome: Continue with Current Goal  Goal: Patient and family demonstrates knowledge of expected response to operative or invasive procedures Outcome: Continue with Current Goal  Problem: Acute pain Goal: The patient demonstrates knowledge of pain management. Outcome: Continue with Current Goal  Problem: Risk for falls Goal: Patient is free from signs and symptoms of injuries Outcome: Continue with Current Goal  Problem: Nausea Goal: The patient receives prescribed medications and solutions, safely administered during the post-op period Outcome: Continue with Current Goal  Problem: Decreased Gait Goal: Ambulate with least restrictive assistive device Outcome: Continue with Current Goal Goal: Improve balance Outcome: Continue with Current Goal  Problem: Decreased ADL Independence Goal: Improve ADL independence Outcome: Continue with Current Goal  Electronically signed by Lenora Garfield, RN at 07/08/2024  9:22 AM EDT

## (undated) NOTE — Unmapped External Note (Signed)
 Formatting of this note is different from the original.  Problem: DVT (Risk For) Goal: Demonstrates adequate circulation status as evidenced by the following indicators: Peripheral pulses strong/Peripheral pulses symmetrical/ Peripheral edema not present Outcome: Progressing Toward Goal  Problem: Knowledge Deficit Goal: Patient will verbalize and/or demonstrate understanding of teaching by discharge Outcome: Progressing Toward Goal  Problem: Infection/Isolation (Risk/Actual) Goal: Patient will be free of hospital acquired infection throughout hospitalization Outcome: Progressing Toward Goal  Problem: Pain/Comfort Goal: Adequate pain control while hospitalized Outcome: Progressing Toward Goal  Problem: Fall Risk Goal: Patient will remain as independent as possible Outcome: Progressing Toward Goal  Problem: Skin Alteration (Risk for/Actual) Goal: Patient?s skin integrity will remain intact Outcome: Progressing Toward Goal  Electronically signed by Geraline Senters, RN at 06/28/2024  8:08 PM EDT

## (undated) NOTE — Progress Notes (Signed)
 Formatting of this note is different from the original. Orthopaedic Progress Note  Patient Info: Name: Bruce Ball  Admission Date: 06/27/2024  3:16 PM  MRN: [id number] Hospital Day: Day 9 of ? (NO DRG)   Room/Bed: 5B32/01 DOB: 1946/05/24   Age: 34 y.o.    Interval Update / Subjective  - No acute events overnight.  - No new numbness, tingling, weakness.  - Pain controlled  - Denies Nausea, Vomiting, Diarrhea, Chest pain, and SOB  Objective   Temp:  [36.7 C (98 F)-36.9 C (98.4 F)] 36.7 C (98.1 F) Heart Rate:  [70-81] 71 Respirations:  [18] 18 Blood Pressure: (116-148)/(60-82) 148/82  Labs: Recent Labs  Lab 07/03/24 0627 07/05/24 2345  NA 141 141  K 3.8 3.9  CL 105 105  CO2 28 27  BUN 26* 29*  CREATININE 1.2 1.1  GLU 102 99   Recent Labs  Lab 07/03/24 0627 07/05/24 2345  WBC 9.6 8.4  HGB 13.4 12.5*  PLT 199 215   Recent Labs  Lab 06/29/24 2321  INR 1.1*   I/O I/O last 3 completed shifts: In: 596 [P.O.:596] Out: 800 [Urine:800] No intake/output data recorded.  Gen: NAD, AOx3 Pulm: Non-labored breathing Cards: Regular rate and rhythm   RLE: Dressings c/d/I, knee immobilizer in place SILT sp/dp/t/sa/su Motor +ehl/fhl/ta/gsc Compartments soft, compressible Toes wwp, BCR x5 2+ DP pulse  Assessment   Bruce Ball is a 17 y.o. male s/p GLF.  Injury/Diagnosis Complex  - R periprosthetic MFC fx  Surgeries - R femur ORIF on 07/01/24 with Dr. Jackquelyn Salines Problem List Active Hospital Problems   Diagnosis Date Noted   DVT (deep venous thrombosis) (CMS-HCC) 06/28/2024   HTN (hypertension), benign 06/28/2024   Acid reflux 06/28/2024   CHF (congestive heart failure) (CMS-HCC) 06/28/2024   Renal disease 06/28/2024   Closed displaced fracture of condyle of right femur (CMS-HCC) 06/27/2024   Trauma 06/27/2024   Plan  General  -IS - Monitor & replete lytes PRN - BR  Acute Pain - Multimodal analgesia  - Adjust PRN  RLE -  Splints/Brace/Ex-fix: HKB locked at 20  - Drains/wound vacs: None - Surgery(s) complete - Follow-up: 7/22 with TL  - PT/OT  Current Activity Order (From admission, onward)         Ordered    Activity - Weight Bearing and Restrictions  UNTIL DISCONTINUED       Question Answer Comment  Activity Restrictions Weight Bearing   Activity Restrictions Brace Wear Precautions   Weight bearing status RUE Weight bearing as tolerated   Weight bearing status LUE Weight bearing as tolerated   Weight bearing status RLE Non-weight bearing   Weight bearing status LLE Weight bearing as tolerated   Bracing Wear RLE   Patient to wear RLE brace At all times (specify when to remove)   When to remove RLE brace N/A     07/01/24 1411        PPX - Antibiotics: 24 Hr Periop Ancef  - IP: Lovenox  - at Discharge: Complete initial 4 weeks ASA 81mg  BID  Remainder of care per Co-Management Team W  This patient is on [facility] Internal Medicine Co-Management Team W.  Orthopedic Surgery can be reached at pic [phone] for any questions regarding pain control or the injured/operative extremity. [facility] Team W can be reached at pic [phone] for any questions regarding vital sign abnormalities, blood sugar, urinary retention, constipation, or any other medical concerns. In the event of a patient emergency,  please page [facility] W at pic [phone].  Discharge Planning - Estimated DC Date: 7/10 - Barrier: SAR - DC Location: SAR - PT/OT: Clearance not needed   Hosey Arrow, APRN, AGACNP-BC  [facility]    PRIMARY Ortho Trauma or Joints - PIC [phone]   ALL NEW CONSULTS - PIC [phone]  New Orthopaedic consult patients or newly found injuries  CURRENT/EXISTING PATIENTS Ortho TRAUMA CONSULTS - PIC [phone]  Ortho SPINE - PIC [phone]  Ortho HAND - PIC [id number]  Podiatry - PIC [phone]  Electronically signed by Arrow Hosey Borer, NP at 07/06/2024  9:50 AM EDT

## (undated) NOTE — Progress Notes (Signed)
 Formatting of this note might be different from the original. Physical Therapy Contact Note  PT consult received. Per chart review pt with distal femur fx pending definitive plan from ortho. PT will follow up further medical work up.   DOROTHA Dozier Quarry, PT, DPT Electronically signed by Quarry Cadet, PT at 06/28/2024  7:41 AM EDT

## (undated) NOTE — Unmapped External Note (Signed)
 Formatting of this note might be different from the original.  Problem: DVT (Risk For) Goal: Demonstrates adequate circulation status as evidenced by the following indicators: Peripheral pulses strong/Peripheral pulses symmetrical/ Peripheral edema not present Outcome: Continue with Current Goal  Problem: Knowledge Deficit Goal: Patient will verbalize and/or demonstrate understanding of teaching by discharge Outcome: Continue with Current Goal Goal: Patient will Participate in learning opportunities offered by staff Outcome: Continue with Current Goal  Problem: Infection/Isolation (Risk/Actual) Goal: Patient will be free of hospital acquired infection throughout hospitalization Outcome: Continue with Current Goal  Problem: Pain/Comfort Goal: Adequate pain control while hospitalized Outcome: Continue with Current Goal  Problem: Fall Risk Goal: Patient will remain as independent as possible Outcome: Continue with Current Goal Goal: Patient will have lower fall risk. Lower injury risk. Outcome: Continue with Current Goal Goal: Patient will remain safe from falls and injury Outcome: Continue with Current Goal Goal: Patient/family will understand fall prevention measures Outcome: Continue with Current Goal Goal: Patient/family will understand injury reduction measures Outcome: Continue with Current Goal Goal: Patient/family will comply with fall program Outcome: Continue with Current Goal Goal: Patient/family will verbalize fall prevention strategies to implement after discharge Outcome: Continue with Current Goal  Problem: Anxiety Goal: The patient's care is consistent with the individualized perioperative plan of care. Outcome: Continue with Current Goal Goal: The patient's right to privacy is maintained. Outcome: Continue with Current Goal Goal: Patient and family demonstrates knowledge of expected response to operative or invasive procedures Outcome: Continue with Current  Goal  Problem: Anxiety Goal: The patient's care is consistent with the individualized perioperative plan of care. Outcome: Continue with Current Goal Goal: The patient's right to privacy is maintained. Outcome: Continue with Current Goal Goal: Patient and family demonstrates knowledge of expected response to operative or invasive procedures Outcome: Continue with Current Goal  Problem: Acute pain Goal: The patient demonstrates knowledge of pain management. Outcome: Continue with Current Goal  Problem: Risk for falls Goal: Patient is free from signs and symptoms of injuries Outcome: Continue with Current Goal  Problem: Nausea Goal: The patient receives prescribed medications and solutions, safely administered during the post-op period Outcome: Continue with Current Goal  Problem: Anxiety Goal: The patient's care is consistent with the individualized perioperative plan of care. Outcome: Continue with Current Goal Goal: The patient's right to privacy is maintained. Outcome: Continue with Current Goal Goal: Patient and family demonstrates knowledge of expected response to operative or invasive procedures Outcome: Continue with Current Goal  Problem: Acute pain Goal: The patient demonstrates knowledge of pain management. Outcome: Continue with Current Goal  Problem: Risk for falls Goal: Patient is free from signs and symptoms of injuries Outcome: Continue with Current Goal  Problem: Nausea Goal: The patient receives prescribed medications and solutions, safely administered during the post-op period Outcome: Continue with Current Goal  Problem: Post-Operative Respiratory Complications Goal: Patient Will Remain Free Of Post-Operative Complications Outcome: Continue with Current Goal Goal: Effective Utilization Of Incentive Spirometry Description: INTERVENTIONS: 1. 10 times every hour while awake   2. Document volume every 4 hours Outcome: Continue with Current Goal Goal:  Effective Pulmonary Toileting Description: INTERVENTIONS: 1. Cough/deep breath every 2 hours while awake Outcome: Continue with Current Goal Goal: Appropriate Oral Care Maintenance Description: INTERVENTIONS:  1. Oral care every 12 hours Outcome: Continue with Current Goal Goal: Adequate Patient/Family Education Description: INTERVENTIONS: 1. Patient/family education provided  Outcome: Continue with Current Goal Goal: Implementation Of Appropriate Early Mobility Interventions Description: INTERVENTIONS: 1.  (Acute Care/Med-Surg) Get-Up-And-Go  assessment completed, and interventions initiated 2.  (Critical/Intermediate Care) M.O.V.E.S. assessment completed, and interventions initiated Outcome: Continue with Current Goal Goal: Maintenance of HOB Elevation Description: INTERVENTIONS: 1. HOB 30 degrees Outcome: Continue with Current Goal  Problem: Decreased Gait Goal: Ambulate with least restrictive assistive device Outcome: Continue with Current Goal Goal: Improve balance Outcome: Continue with Current Goal  Electronically signed by Prentiss Krabbe, RN at 07/02/2024 12:41 PM EDT

## (undated) NOTE — Unmapped External Note (Signed)
 Formatting of this note is different from the original.  Problem: DVT (Risk For) Goal: Demonstrates adequate circulation status as evidenced by the following indicators: Peripheral pulses strong/Peripheral pulses symmetrical/ Peripheral edema not present Outcome: Continue with Current Goal  Problem: Knowledge Deficit Goal: Patient will verbalize and/or demonstrate understanding of teaching by discharge Outcome: Continue with Current Goal Goal: Patient will Participate in learning opportunities offered by staff Outcome: Continue with Current Goal  Problem: Infection/Isolation (Risk/Actual) Goal: Patient will be free of hospital acquired infection throughout hospitalization Outcome: Continue with Current Goal  Problem: Pain/Comfort Goal: Adequate pain control while hospitalized Outcome: Continue with Current Goal  Problem: Fall Risk Goal: Patient will remain as independent as possible Outcome: Continue with Current Goal Goal: Patient will have lower fall risk. Lower injury risk. Outcome: Continue with Current Goal Goal: Patient will remain safe from falls and injury Outcome: Continue with Current Goal Goal: Patient/family will understand fall prevention measures Outcome: Continue with Current Goal Goal: Patient/family will understand injury reduction measures Outcome: Continue with Current Goal Goal: Patient/family will comply with fall program Outcome: Continue with Current Goal Goal: Patient/family will verbalize fall prevention strategies to implement after discharge Outcome: Continue with Current Goal  Problem: Skin Alteration (Risk for/Actual) Goal: Patient?s skin integrity will remain intact Outcome: Continue with Current Goal Goal: Patient will evidence no skin breakdown Outcome: Continue with Current Goal Goal: Patient will regain integrity of skin tissue Outcome: Continue with Current Goal  Problem: Anxiety Goal: The patient's care is consistent with the  individualized perioperative plan of care. Outcome: Continue with Current Goal Goal: The patient's right to privacy is maintained. Outcome: Continue with Current Goal Goal: Patient and family demonstrates knowledge of expected response to operative or invasive procedures Outcome: Continue with Current Goal  Problem: Anxiety Goal: The patient's care is consistent with the individualized perioperative plan of care. Outcome: Continue with Current Goal Goal: The patient's right to privacy is maintained. Outcome: Continue with Current Goal Goal: Patient and family demonstrates knowledge of expected response to operative or invasive procedures Outcome: Continue with Current Goal  Problem: Acute pain Goal: The patient demonstrates knowledge of pain management. Outcome: Continue with Current Goal  Problem: Risk for falls Goal: Patient is free from signs and symptoms of injuries Outcome: Continue with Current Goal  Problem: Nausea Goal: The patient receives prescribed medications and solutions, safely administered during the post-op period Outcome: Continue with Current Goal  Problem: Post-Operative Respiratory Complications Goal: Patient Will Remain Free Of Post-Operative Complications Outcome: Continue with Current Goal Goal: Effective Utilization Of Incentive Spirometry Description: INTERVENTIONS: 1. 10 times every hour while awake   2. Document volume every 4 hours Outcome: Continue with Current Goal Goal: Effective Pulmonary Toileting Description: INTERVENTIONS: 1. Cough/deep breath every 2 hours while awake Outcome: Continue with Current Goal Goal: Appropriate Oral Care Maintenance Description: INTERVENTIONS:  1. Oral care every 12 hours Outcome: Continue with Current Goal Goal: Adequate Patient/Family Education Description: INTERVENTIONS: 1. Patient/family education provided  Outcome: Continue with Current Goal Goal: Implementation Of Appropriate Early Mobility  Interventions Description: INTERVENTIONS: 1.  (Acute Care/Med-Surg) Get-Up-And-Go assessment completed, and interventions initiated 2.  (Critical/Intermediate Care) M.O.V.E.S. assessment completed, and interventions initiated Outcome: Continue with Current Goal Goal: Maintenance of HOB Elevation Description: INTERVENTIONS: 1. HOB 30 degrees Outcome: Continue with Current Goal  Problem: Anxiety Goal: The patient's care is consistent with the individualized perioperative plan of care. Outcome: Continue with Current Goal Goal: The patient's right to privacy is maintained. Outcome: Continue with Current Goal  Goal: Patient and family demonstrates knowledge of expected response to operative or invasive procedures Outcome: Continue with Current Goal  Problem: Acute pain Goal: The patient demonstrates knowledge of pain management. Outcome: Continue with Current Goal  Problem: Risk for falls Goal: Patient is free from signs and symptoms of injuries Outcome: Continue with Current Goal  Problem: Nausea Goal: The patient receives prescribed medications and solutions, safely administered during the post-op period Outcome: Continue with Current Goal  Problem: Decreased Gait Goal: Ambulate with least restrictive assistive device Outcome: Continue with Current Goal Goal: Improve balance Outcome: Continue with Current Goal  Electronically signed by [name] Rife, RN at 07/02/2024  8:11 PM EDT

## (undated) NOTE — Progress Notes (Signed)
 Formatting of this note is different from the original. This patient is on [facility] Orthopedic Surgery Internal Medicine Co-Management Team W.  Orthopedic Surgery can be reached at pic [phone] for any questions regarding pain control or the injured/operative extremity. [facility] Team W can be reached at pic [phone] for any questions regarding vital sign abnormalities, blood sugar, urinary retention, constipation, or any other medical concerns.  In the event of a patient emergency, please page [facility] Team W at pic [phone].  Internal Medicine Progress Note  Service: [facility] Medicine Team [facility] Hospital Day: LOS: 14 days  Attending: Caroll Daniel Maude Luke, MD  Subjective / Interval History  No acute events overnight. This morning, the patient continues to feel well. No new symptoms, awaiting discharge on Tuesday.  Objective  Physical Exam (5) 24hour vital signs: Temp:  [36.6 C (97.8 F)-37.1 C (98.8 F)] 37.1 C (98.8 F) Heart Rate:  [65-78] 68 Respirations:  [17-18] 18 Blood Pressure: (128-158)/(65-81) 146/68  @LASTSAO2 (1)@ Weight: 112.8 kg (248 lb 11.2 oz)   I/O last 3 completed shifts: In: 600 [P.O.:600] Out: 200 [Urine:200]  GEN: well-appearing man, sitting upright in bed, appears comfortable HEENT: anicteric sclera, MMM, no conjunctival pallor CV: normal rate, regular rhythm, no murmurs, no LE edema, extremities wwp PULM: lungs clear, no wheezes or crackles GI: soft, nontender, nondistended GU: no foley; +condom cath in place SKIN: no jaundice, +hematoma above R eye MSK: moves all extremities; R knee immobilizer in place NEURO: alert and oriented, no focal deficits  Medications Scheduled Meds:  apixaban   2.5 mg Oral BID   DULoxetine  DR  60 mg Oral Daily   lidocaine   1 patch Transdermal Q24H   acetaminophen   1,000 mg Oral TID   normal saline  10 mL Intravenous EVERY 8 HOURS   sennosides  2 tablet Oral BID   polyethylene glycol  17 g Oral Daily   moisturizer/plaque preventer  1 each  Mouth/Throat BID   gabapentin   300 mg Oral Nightly   AmLODIPine   10 mg Oral Daily   atorvastatin   20 mg Oral Nightly   carvedilol   25 mg Oral Q12H   cholecalciferol   2,000 Units Oral Daily   pantoprazole  DR  40 mg Oral Daily   Continuous Infusions:  PRN Meds:oxyCODONE  IR, [DISCONTINUED] traMADol  **OR** oxyCODONE  IR, [COMPLETED] Insert Midline Catheter **AND** Maintain Midline Catheter **AND** normal saline **AND** sodium chloride , [COMPLETED] Insert Peripheral IV **AND** Maintain Peripheral IV **AND** normal saline, [COMPLETED] Insert Peripheral IV **AND** Maintain Peripheral IV **AND** normal saline, ondansetron , naloxone, bisacodyl  **OR** bisacodyl , cetirizine, furosemide , methocarbamol , fluticasone   Labs  CMP: No results for input(s): NA, K, CL, CO2, BUN, CREATININE, GLU, CALCIUM , MG, PHOS, BILITOT, BILIDIR, ALKPHOS, AST, ALT, PROT, LABALB in the last 72 hours.  CBC and Coags: No results for input(s): WBC, NEUTOPHILPCT, LYMPHOPCT, HGB, HCT, MCV, PLT, INR in the last 72 hours.  Cardiac Enzymes: No results for input(s): CKTOTAL, CKMB, TROPONINI in the last 72 hours.  Assessment/Plan:  Bruce Ball is a 37 y.o. male with PMHx PE (2023 per wife), CKD3a, Hx of prostrate cancer (01/ 2003, Gleason 3+3, 03-10-2002 s/p radical prostatectomy pelvic lymph node disections, no recurrence), lumbosacral radiculopathy, CAD (CAC on CT), PVCs on Coreg , HTN, GERD, vitamin D  deficiency, essential tremor, Hx Sarcoma (~1970, s/p revision right proximal femur replacement in setting of distal periprosthetic femur fx both components 09/2021), osteoporosis (Tx IV reclast yearly), HLD who presented 6/29 [place]F at airport, found to have R periprosthetic distal femoral condyle fracture s/p  ORIF 7/3. Medically ready for discharge, now awaiting discharge to SAR in [place]  on 7/15.  #Periprosthetic distal femur fx s/p ORIF 7/3 #Hx Sarcoma (~1970, s/p  revision right proximal femur replacement in setting of distal periprosthetic femur fx both components 09/2021) CT Femur R: displaced periprosthetic fracture along the superior medial femoral condyle. Revised R hip prosthesis, now with periprosthetic distal femur fx. Functional status appears fair; frailty index 0.17. No current cardiac symptoms;  - Multimodal pain management and DVT ppx per ortho - senna + miralax  for bowel regimen  #Hx PE (2023) On for prior PE (12/2019). INR 1.4, PT 15.6.  - Resumed home apixaban  2.5mg  BID on 7/7 with permission from orthopedics  #HTN BP 194/94 on admission - Continue home amlodipine  10 mg daily and and carvedilol  25 mg BID   CHRONIC/RESOLVED  #Chest wall pain, resolved No concerning findings on exam - Resolved with lidocaine  patch to chest wall   #CKD2  Cr 1.5 on arrival, now stable in 1.1-1.2 range. - CTM  #CAD #PVC On carvedilol  25 mg BID, well tolerated. No CP, SOB, or recent angina. Low suspicion for active cardiac ischemia. - continue home Coreg   #Osteoporosis On annual Reclast. Recent fracture underscores disease severity. - Ensure adequate calcium  + vit D intake post-op. - Consider DEXA recheck post-recovery  #Hx prostrate cancer (01/ 2003, Gleason 3+3, 03-10-2002 s/p radical prostatectomy pelvic lymph node disections, no recurrence) #Lumbosacral radiculopathy #Chronic R Hip Pain Stable. New pain from new fracture.  - continue gabapentin  300 mg nightly - continue duloxetine  60 mg daily - *Condom cath* (not Foley as previously documented) placed perioperatively ~7/3 and maintained due to history of urinary incontinence. Patient wears Depends at home.   #Essential Tremor - continue home BB op  #Incidental findings Multiple predominantly low attenuation masses throughout the liver are mostly subcentimeter with others that range up in size to 3.6 cm x 2.1 cm x 1.7 cm (segment IV). Most are well-defined, but several subcentimeter  lesions in the right lobe are faint and poorly defined. 2. Locally enlarged thyroid  with multiple nodules better detected by concurrent chest CT. Largest nodule measures 1.6 cm and is in the left lobe  Pt to follow up with incidental findings with primary providers [place]  General:  - Diet/education: Regular diet  - DVT Prophylaxis: apixaban   - Functional Status/Consults: PT/OT  - Lines/Catheters currently in place: PIV; Condom cath (not foley as previously documented)  - Code Status: Full Code  Discharge Planning   Anticipated Discharge Date Medically ready  Anticipated Discharge Location Anticipate SAR in [place]  Barriers Accepting facility   Pan Su Maude Cramp, MD [facility] Medicine Team W [phone]  07/11/2024 12:15 PM EDT  This patient is on [facility] Orthopedic Surgery Internal Medicine Co-Management Team W.  Orthopedic Surgery can be reached at pic [phone] for any questions regarding pain control or the injured/operative extremity. [facility] Team W can be reached at pic [phone] for any questions regarding vital sign abnormalities, blood sugar, urinary retention, constipation, or any other medical concerns.  In the event of a patient emergency, please page [facility] Team W at pic [phone].  Electronically signed by Cramp Caroll Clunes, MD at 07/11/2024  2:52 PM EDT

## (undated) NOTE — Nursing Note (Signed)
 Formatting of this note might be different from the original. Received report from Ajae  RN at bedside. Patient is A/O x 4, no signs of acute distress, bed is in the lowest position, locked, and in zone 2. Saturating well at room air. Board updated, side rails are up, call light is within reach. Continuing plan of care.    Electronically signed by Levan Rondo, RN at 07/10/2024  8:46 AM EDT

## (undated) NOTE — Unmapped External Note (Signed)
 Formatting of this note is different from the original.  Problem: DVT (Risk For) Goal: Demonstrates adequate circulation status as evidenced by the following indicators: Peripheral pulses strong/Peripheral pulses symmetrical/ Peripheral edema not present 06/28/2024 2014 by Geraline Senters, RN Outcome: Progressing Toward Goal 06/28/2024 2008 by Geraline Senters, RN Outcome: Progressing Toward Goal  Problem: Knowledge Deficit Goal: Patient will verbalize and/or demonstrate understanding of teaching by discharge 06/28/2024 2014 by Geraline Senters, RN Outcome: Progressing Toward Goal 06/28/2024 2008 by Geraline Senters, RN Outcome: Progressing Toward Goal  Problem: Infection/Isolation (Risk/Actual) Goal: Patient will be free of hospital acquired infection throughout hospitalization 06/28/2024 2014 by Geraline Senters, RN Outcome: Progressing Toward Goal 06/28/2024 2008 by Geraline Senters, RN Outcome: Progressing Toward Goal  Problem: Pain/Comfort Goal: Adequate pain control while hospitalized 06/28/2024 2014 by Geraline Senters, RN Outcome: Progressing Toward Goal 06/28/2024 2008 by Geraline Senters, RN Outcome: Progressing Toward Goal  Problem: Fall Risk Goal: Patient will remain as independent as possible 06/28/2024 2014 by Geraline Senters, RN Outcome: Progressing Toward Goal 06/28/2024 2008 by Geraline Senters, RN Outcome: Progressing Toward Goal  Problem: Skin Alteration (Risk for/Actual) Goal: Patient?s skin integrity will remain intact 06/28/2024 2014 by Geraline Senters, RN Outcome: Progressing Toward Goal 06/28/2024 2008 by Geraline Senters, RN Outcome: Progressing Toward Goal  Electronically signed by Geraline Senters, RN at 06/28/2024  8:14 PM EDT

## (undated) NOTE — Progress Notes (Signed)
 Formatting of this note is different from the original. This patient is on [facility] Orthopedic Surgery Internal Medicine Co-Management Team W.  Orthopedic Surgery can be reached at pic [phone] for any questions regarding pain control or the injured/operative extremity. [facility] Team W can be reached at pic [phone] for any questions regarding vital sign abnormalities, blood sugar, urinary retention, constipation, or any other medical concerns.  In the event of a patient emergency, please page [facility] Team W at pic [phone].  Internal Medicine Progress Note  Service: [facility] Medicine Team [facility] Day: LOS: 2 days  Attending: Almarie Gwen Sharps, MD, MD  Subjective / Interval History   No acute events overnight. Pt denies complaints. Wife at bedside. Hoping pt will be able to transfer to [place]  Surgical plans currently on hold as ortho attempts to manage transfer if possible  Objective  Physical Exam (5) 24hour vital signs: Temp:  [36.2 C (97.1 F)-37.2 C (98.9 F)] 37.2 C (98.9 F) Heart Rate:  [61-72] 70 Respirations:  [18-19] 18 Blood Pressure: (122-175)/(65-81) 175/77  @LASTSAO2 (1)@ Weight: 112.8 kg (248 lb 11.2 oz)   I/O last 3 completed shifts: In: 300 [P.O.:300] Out: 200 [Urine:200]  GEN:  NAD, conversant HENT:  NCAT, moist mucus membranes CV:  RRR, no M/R/G PULM:  CTAB, no W/R/R ABD:  Soft, NT/ND, NABS MSK:  R knee immobilizer in place SKIN:  Bruising to R forehead over eye  Medications Scheduled Meds:  Enoxaprin Ransom inj for VTE Prophylaxis (HIGH risk for VTE)  30 mg Subcutaneous Q12H SCH   acetaminophen   1,000 mg Oral Q8H   sennosides  2 tablet Oral BID   polyethylene glycol  17 g Oral Daily   moisturizer/plaque preventer  1 each Mouth/Throat BID   gabapentin   300 mg Oral Nightly   AmLODIPine   10 mg Oral Daily   [Held by Provider] apixaban   2.5 mg Oral BID   atorvastatin   20 mg Oral Nightly   carvedilol   25 mg Oral Q12H   cholecalciferol   2,000 Units Oral Daily    pantoprazole  DR  40 mg Oral Daily   DULoxetine  DR  30 mg Oral Daily   Continuous Infusions: PRN Meds:[COMPLETED] Insert Peripheral IV **AND** Maintain Peripheral IV **AND** normal saline, [COMPLETED] Insert Peripheral IV **AND** Maintain Peripheral IV **AND** normal saline, ondansetron , traMADol  **OR** oxyCODONE  IR, naloxone, bisacodyl  **OR** bisacodyl , cetirizine, furosemide , HYDROmorphone , methocarbamol , fluticasone   Labs  CMP: Recent Labs    06/27/24 1538  NA 144  K 4.5  CL 112*  CO2 24  BUN 20  CREATININE 1.5*  GLU 100  CALCIUM  8.6*  MG 1.8  BILITOT 1.0  BILIDIR 0.2  ALKPHOS 63  AST 23  ALT 17  PROT 5.8*  LABALB 3.7   CBC and Coags: Recent Labs    06/27/24 1538  WBC 9.8  HGB 15.1  HCT 46.9  MCV 87.0  PLT 173  INR 1.4*   Cardiac Enzymes: No results for input(s): CKTOTAL, CKMB, TROPONINI in the last 72 hours.  Assessment/Plan:  Bruce Ball is a 47 y.o. male with PMHx PE (12/2019), CKD3a, Hx of prostrate cancer (01/ 2003, Gleason 3+3, 03-10-2002 s/p radical prostatectomy pelvic lymph node disections, no recurrence), lumbosacral radiculopathy, CAD (CAC on CT), PVCs on Coreg , HTN, GERD, vitamin D  deficiency, essential tremor, Hx Sarcoma (~1970, s/p revision right proximal femur replacement in setting of distal periprosthetic femur fx both components 09/2021), osteoporosis (Tx IV reclast yearly), HLD with Frailty score of 0.17 who is s/p  GLF at airport with resultant right periprosthetic distal femoral condyle fracture.    #Pre-op Risk Stratification & Medical Optimization #Periprosthetic distal femur fx #Hx Sarcoma (~1970, s/p revision right proximal femur replacement in setting of distal periprosthetic femur fx both components 09/2021) #Pain Management CT Femur R: displaced periprosthetic fracture along the superior medial femoral condyle. Revised R hip prosthesis, now with periprosthetic distal femur fx. Functional status appears fair; frailty index  0.17. No current cardiac symptoms;  - surgical management per ortho - RCRI = 1 (Hx CAD); low risk for proposed surgery - cont on HYDROmorphone  PRN + methocarbamol  PRN. - Pain management per ortho - Tylenol  1g TID PRN - senna + miralax  for bowel regimen Medically cleared for surgery or transfer to [facility]  #Hx PE On for prior PE (12/2019). INR 1.4, PT 15.6.  - hold apixaban  pre-op; coordinate timing with anesthesia and ortho. No bridging indicated if low to moderate risk. - Resume post-op when hemostasis secure. No recent VTE.  #HTN BP 194/94 on admission; patient on amlodipine  10 mg daily and and carvedilol  25 mg BID  - Continue amlodipine  and coreg   #CKD3a (Cr 1.5, baseline per chart) Labs otherwise stable (K 4.5, CO2 24, BUN 20) - Avoid nephrotoxins (NSAIDs, contrast) - Ensure adequate volume status pre/post-op  #CAD #PVC On carvedilol  25 mg BID, well tolerated. No CP, SOB, or recent angina. Low suspicion for active cardiac ischemia. - No further cardiac work-up indicated unless new symptoms emerge. - Coreg  held on admission, however, no need to hold prei-op BB in pt who is on long standing, stable dose and stable vitals -Continue Coreg   #Osteoporosis On annual Reclast. Recent fracture underscores disease severity. - Ensure adequate calcium  + vit D intake post-op. - Consider DEXA recheck post-recovery  #Hx prostrate cancer (01/ 2003, Gleason 3+3, 03-10-2002 s/p radical prostatectomy pelvic lymph node disections, no recurrence) #Lumbosacral radiculopathy #Chronic R Hip Pain Stable. New pain from new fracture.  - continue gabapentin  300 mg nightly - continue duloxetine  30 mg daily  #Essential Tremor - continue home BB post-op  General:  - Diet/education: Regular diet  - DVT Prophylaxis: Lovenox   - Functional Status/Consults: PT/OT  - Lines/Catheters currently in place: PIV  - Code Status: Full Code  Discharge Planning   Anticipated Discharge Date TBD  Anticipated  Discharge Location TBD  Barriers Transfer v surgical intervention   Almarie Gwen Sharps, MD [facility] Medicine Team W [phone]  06/29/2024 12:15 PM EDT  This patient is on [facility] Orthopedic Surgery Internal Medicine Co-Management Team W.  Orthopedic Surgery can be reached at pic [phone] for any questions regarding pain control or the injured/operative extremity. [facility] Team W can be reached at pic [phone] for any questions regarding vital sign abnormalities, blood sugar, urinary retention, constipation, or any other medical concerns.  In the event of a patient emergency, please page [facility] Team W at pic [phone].  Electronically signed by Sharps Almarie Gwen, MD at 06/29/2024 12:30 PM EDT

## (undated) NOTE — Unmapped External Note (Signed)
 Formatting of this note might be different from the original.  Problem: DVT (Risk For) Goal: Demonstrates adequate circulation status as evidenced by the following indicators: Peripheral pulses strong/Peripheral pulses symmetrical/ Peripheral edema not present 07/11/2024 0913 by Levan Rondo, RN Outcome: Continue with Current Goal  Problem: Knowledge Deficit Goal: Patient will Participate in learning opportunities offered by staff 07/11/2024 0913 by Levan Rondo, RN Outcome: Continue with Current Goal 07/11/2024 0913 by Levan Rondo, RN Outcome: Continue with Current Goal  Problem: Pain/Comfort Goal: Adequate pain control while hospitalized 07/11/2024 0913 by Levan Rondo, RN Outcome: Continue with Current Goal  Problem: Fall Risk Goal: Patient will remain safe from falls and injury 07/11/2024 0913 by Levan Rondo, RN Outcome: Continue with Current Goal 07/11/2024 0913 by Levan Rondo, RN Outcome: Continue with Current Goal  Electronically signed by Levan Rondo, RN at 07/11/2024  9:13 AM EDT

## (undated) NOTE — Unmapped External Note (Signed)
 Formatting of this note is different from the original.  Problem: DVT (Risk For) Goal: Demonstrates adequate circulation status as evidenced by the following indicators: Peripheral pulses strong/Peripheral pulses symmetrical/ Peripheral edema not present Outcome: Continue with Current Goal  Problem: Knowledge Deficit Goal: Patient will verbalize and/or demonstrate understanding of teaching by discharge Outcome: Continue with Current Goal Goal: Patient will Participate in learning opportunities offered by staff Outcome: Continue with Current Goal  Problem: Infection/Isolation (Risk/Actual) Goal: Patient will be free of hospital acquired infection throughout hospitalization Outcome: Continue with Current Goal  Problem: Pain/Comfort Goal: Adequate pain control while hospitalized Outcome: Continue with Current Goal  Problem: Fall Risk Goal: Patient will remain as independent as possible Outcome: Continue with Current Goal Goal: Patient will have lower fall risk. Lower injury risk. Outcome: Continue with Current Goal Goal: Patient will remain safe from falls and injury Outcome: Continue with Current Goal Goal: Patient/family will understand fall prevention measures Outcome: Continue with Current Goal Goal: Patient/family will understand injury reduction measures Outcome: Continue with Current Goal Goal: Patient/family will comply with fall program Outcome: Continue with Current Goal Goal: Patient/family will verbalize fall prevention strategies to implement after discharge Outcome: Continue with Current Goal  Problem: Skin Alteration (Risk for/Actual) Goal: Patient?s skin integrity will remain intact Outcome: Continue with Current Goal Goal: Patient will evidence no skin breakdown Outcome: Continue with Current Goal Goal: Patient will regain integrity of skin tissue Outcome: Continue with Current Goal  Problem: Anxiety Goal: The patient's care is consistent with the  individualized perioperative plan of care. Outcome: Continue with Current Goal Goal: The patient's right to privacy is maintained. Outcome: Continue with Current Goal Goal: Patient and family demonstrates knowledge of expected response to operative or invasive procedures Outcome: Continue with Current Goal  Problem: Anxiety Goal: The patient's care is consistent with the individualized perioperative plan of care. Outcome: Continue with Current Goal Goal: The patient's right to privacy is maintained. Outcome: Continue with Current Goal Goal: Patient and family demonstrates knowledge of expected response to operative or invasive procedures Outcome: Continue with Current Goal  Problem: Acute pain Goal: The patient demonstrates knowledge of pain management. Outcome: Continue with Current Goal  Problem: Risk for falls Goal: Patient is free from signs and symptoms of injuries Outcome: Continue with Current Goal  Problem: Nausea Goal: The patient receives prescribed medications and solutions, safely administered during the post-op period Outcome: Continue with Current Goal  Problem: Post-Operative Respiratory Complications Goal: Patient Will Remain Free Of Post-Operative Complications Outcome: Continue with Current Goal Goal: Effective Utilization Of Incentive Spirometry Description: INTERVENTIONS: 1. 10 times every hour while awake   2. Document volume every 4 hours Outcome: Continue with Current Goal Goal: Effective Pulmonary Toileting Description: INTERVENTIONS: 1. Cough/deep breath every 2 hours while awake Outcome: Continue with Current Goal Goal: Appropriate Oral Care Maintenance Description: INTERVENTIONS:  1. Oral care every 12 hours Outcome: Continue with Current Goal Goal: Adequate Patient/Family Education Description: INTERVENTIONS: 1. Patient/family education provided  Outcome: Continue with Current Goal Goal: Implementation Of Appropriate Early Mobility  Interventions Description: INTERVENTIONS: 1.  (Acute Care/Med-Surg) Get-Up-And-Go assessment completed, and interventions initiated 2.  (Critical/Intermediate Care) M.O.V.E.S. assessment completed, and interventions initiated Outcome: Continue with Current Goal Goal: Maintenance of HOB Elevation Description: INTERVENTIONS: 1. HOB 30 degrees Outcome: Continue with Current Goal  Problem: Anxiety Goal: The patient's care is consistent with the individualized perioperative plan of care. Outcome: Continue with Current Goal Goal: The patient's right to privacy is maintained. Outcome: Continue with Current Goal  Goal: Patient and family demonstrates knowledge of expected response to operative or invasive procedures Outcome: Continue with Current Goal  Problem: Acute pain Goal: The patient demonstrates knowledge of pain management. Outcome: Continue with Current Goal  Problem: Risk for falls Goal: Patient is free from signs and symptoms of injuries Outcome: Continue with Current Goal  Problem: Nausea Goal: The patient receives prescribed medications and solutions, safely administered during the post-op period Outcome: Continue with Current Goal  Problem: Decreased Gait Goal: Ambulate with least restrictive assistive device Outcome: Continue with Current Goal Goal: Improve balance Outcome: Continue with Current Goal  Problem: Decreased ADL Independence Goal: Improve ADL independence Outcome: Continue with Current Goal  Electronically signed by [name] Rife, RN at 07/09/2024  2:24 AM EDT

## (undated) NOTE — Nursing Note (Signed)
 Formatting of this note might be different from the original. Wife was upset ortho didn't come and speak with her after the surgery was complete, she stated she was unaware of what was gong on on. Member from ortho team cam and spoke with the patient wife about the surgery and answered any other questions she had about the surgery and medications he was given.  Electronically signed by Evertt Grate, RN at 07/02/2024  4:56 AM EDT

## (undated) NOTE — Unmapped External Note (Signed)
 Formatting of this note might be different from the original.  Problem: DVT (Risk For) Goal: Demonstrates adequate circulation status as evidenced by the following indicators: Peripheral pulses strong/Peripheral pulses symmetrical/ Peripheral edema not present Outcome: Continue with Current Goal  Problem: Pain/Comfort Goal: Adequate pain control while hospitalized Outcome: Continue with Current Goal  Problem: Fall Risk Goal: Patient will remain as independent as possible Outcome: Continue with Current Goal Goal: Patient will have lower fall risk. Lower injury risk. Outcome: Continue with Current Goal Goal: Patient will remain safe from falls and injury Outcome: Continue with Current Goal Goal: Patient/family will understand fall prevention measures Outcome: Continue with Current Goal Goal: Patient/family will understand injury reduction measures Outcome: Continue with Current Goal Goal: Patient/family will comply with fall program Outcome: Continue with Current Goal Goal: Patient/family will verbalize fall prevention strategies to implement after discharge Outcome: Continue with Current Goal  Electronically signed by Janifer Confer, RN at 07/06/2024 12:35 PM EDT

## (undated) NOTE — Unmapped External Note (Signed)
 Formatting of this note is different from the original.                         [facility] Survey Template/Daily Progress Note    Patient Info: Name: Bruce Ball  Admission Date: 06/27/2024  3:16 PM MRN: [id number] Hospital Day:  LOS: 1 day  Room/Bed: 5B42/02 DOB: 1946-07-15   Age: 40 y.o.   HPI Zigmund Linse is a 63M with PMHx PE (12/2019), CKD3a, Hx of prostrate cancer (01/ 2003, Gleason 3+3, 03-10-2002 s/p radical prostatectomy pelvic lymph node disections, no recurrence), lumbosacral radiculopathy, CAD (CAC on CT), PVCs on Coreg , HTN, GERD, vitamin D  deficiency, essential tremor, Hx Sarcoma (~1970, s/p revision right proximal femur replacement in setting of distal periprosthetic femur fx both components 09/2021), osteoporosis (Tx IV reclast yearly), HLD with Frailty score of 0.17 [place] right periprosthetic distal femoral condyle fracture.  Trauma surgery asked to complete a tertiary survey. In room patient had sustained episode of right knee pain, while knee was in immobilizer. Per patient and his wife, he has chronic decreased sensation in his feet numbness. No current chest pain, dyspnea, dizziness, lightheadedness, presyncope, or vision impairment. He is hard of hearing normally.   Acute Care Surgery Treatment Team Assigned: [facility] ACS Trauma 1  Hospital course/Interventions:   - OR pending  PMH:  Past Medical History:  Diagnosis Date   CHF (congestive heart failure) (CMS-HCC)    HTN (hypertension)    Neuropathy    Partial deafness    PE (pulmonary thromboembolism) (CMS-HCC)    Renal disease    PSH:  Past Surgical History No past surgical history on file.   Allergies:  No Known Allergies   Social Hx: Social History   Socioeconomic History   Marital status: Married    Spouse name: Not on file   Number of children: Not on file   Years of education: Not on file   Highest education level: Not on file  Occupational History   Not on file   Tobacco Use   Smoking status: Not on file   Smokeless tobacco: Not on file  Substance and Sexual Activity   Alcohol use: Not on file   Drug use: Not on file   Sexual activity: Not on file  Other Topics Concern   Not on file  Social History Narrative   Not on file   Social Drivers of Health   Financial Resource Strain: Low Risk  (04/02/2024)   Received from [facility]   Overall Financial Resource Strain (CARDIA)    Difficulty of Paying Living Expenses: Not hard at all  Food Insecurity: No Food Insecurity (04/02/2024)   Received from [facility]   Hunger Vital Sign    Within the past 12 months, you worried that your food would run out before you got the money to buy more.: Never true    Within the past 12 months, the food you bought just didn't last and you didn't have money to get more.: Never true  Transportation Needs: No Transportation Needs (04/02/2024)   Received from [facility] - [facility]    Lack of Transportation (Medical): No    Lack of Transportation (Non-Medical): No  Housing Stability: Low Risk  (04/02/2024)   Received from [facility] Stability Vital Sign    In the last 12 months, was there a time when you were not able to pay the  mortgage or rent on time?: No    In the past 12 months, how many times have you moved where you were living?: 0    At any time in the past 12 months, were you homeless or living in a shelter (including now)?: No    NTDS Comorbid Conditions:   Family Hx: No family history on file.   Home Meds: No current facility-administered medications on file prior to encounter.   Current Outpatient Medications on File Prior to Encounter  Medication Sig Dispense Refill   AmLODIPine  (NORVASC ) 10 mg tablet Take 10 mg by mouth every day.     apixaban  (ELIQUIS ) 2.5 MG tablet Take 2.5 mg by mouth 2 times every day.     atorvastatin  (LIPITOR) 20 MG tablet Take 20 mg by mouth every night. FOR CHOLESTEROL     carvedilol  (COREG )  25 mg tablet Take 25 mg by mouth every 12 hours.     cetirizine (ZYRTEC) 10 mg tablet Take 10 mg by mouth use as needed for Allergies or Rhinitis.     Cholecalciferol  (VITAMIN D3) 50 MCG (2000 UT) CAPS Take 2,000 Units by mouth every day.     furosemide  (LASIX ) 20 mg tablet Take 20 mg by mouth use as needed (Take 1 tablet (20 mg total) by mouth daily as needed for fluid (if the feet and/or legs swell or weight increased 3 lbs in a day or 5 lbs in 3 days).).     HYDROmorphone  (DILAUDID ) 4 mg tablet Take 2 mg by mouth every 12 hours as needed for Pain.     methocarbamol  (ROBAXIN ) 500 mg tablet Take 500 mg by mouth 4 times every day as needed (muscle spasms).     pantoprazole  DR (PROTONIX ) 40 mg tablet Take 40 mg by mouth every day.     ferrous sulfate (IRON 325) 325 (65 Fe) MG tablet Take 325 mg by mouth every day with breakfast.     fluticasone  (FLONASE ) 50 MCG/ACT nasal spray 2 sprays by Nasal route use as needed for Rhinitis or Allergies.     DULoxetine  DR (CYMBALTA ) 30 mg DR capsule Take 30 mg by mouth every day.     [DISCONTINUED] gabapentin  (NEURONTIN ) 300 mg capsule Take 300 mg by mouth 3 times every day.      Vital signs: Temp:  [36.6 C (97.9 F)-37.1 C (98.8 F)] 37 C (98.6 F) Heart Rate:  [60-88] 61 Respirations:  [12-19] 18 Blood Pressure: (139-195)/(75-102) 147/78 I/O last 3 shifts: In: 60 [P.O.:60] Out: -   Physical exam: Gen: NAD, resting in bed HEENT: PERRLA, EOMI, trachea midline; Facial ecchymosis s/p fall on forehead and around L eye; no hematomas CV:  good peripheral perfusion Pulmonary: regular respiratory rate, EWOB, symmetric chest rise Abd: soft, nontender, non-distended Ext: warm and well perfused, mild peripheral edema   LUE: no deformity, effusions, soft tissue swelling or discoloration, no lacerations or abrasions, no crepitus palpated, compartments soft and compressible, SILT M/U/R, AIN/PIN/M/U/R motor intact, 2 + RP, brisk cap refill x 5  FROM of joints  without pain  RUE: no deformity, effusions, + abrasion above elbow, tegaderm in place, no crepitus palpated, compartments soft and compressible, SILT M/U/R, AIN/PIN/M/U/R motor intact, 2 + RP, brisk cap refill x 5 , FROM of joints without pain  LLE: no deformity effusions, mild swelling on the lateral aspect of the left ankle, no crepitus palpated, compartments soft and compressible; 4th toe on L foot has onychomycosis  RLE:  KI in place, no wounds or  bleeding appreciated, appropriate swelling; Compartments are soft and compressible  - Bilateral ankles with pressure support pads on; no abrasions beneath  Psych: affect appropriate and congruent with mood  Labs: Recent Labs    06/27/24 1538  WBC 9.8  RBC 5.41  HGB 15.1  HCT 46.9  PLT 173  PT 15.6*  APTT 26.7  INR 1.4*   Recent Labs    06/27/24 1538  NA 144  K 4.5  CL 112*  CO2 24  BUN 20  CREATININE 1.5*  GLU 100  LABALB 3.7  CALCIUM  8.6*  MG 1.8   Imaging:  Radiology Results (last 1 day)     Procedure Component Value Units Date/Time   CT Knee Right wo Contrast [[id number]] Collected: 06/27/24 1721   Order Status: Completed Updated: 06/28/24 1404   Narrative:     EXAM: CT KNEE RIGHT WO CONTRAST  TECHNIQUE: CT extremity images of the right knee without contrast were obtained. Sagittal and coronal reformatted images were reviewed.   CLINICAL INDICATION:  trauma. Ground level fall with periprosthetic fracture.   COMPARISON: Same day radiographs.  FINDINGS:    BONES AND JOINTS: Partially visualized right femur endoprosthesis without identified hardware complication. Acute comminuted and mildly displaced periprosthetic fracture involving the medial condyle with a vertical component adjacent to the hardware bone  at junction. The fracture lucency extends to the trochlea and intercondylar notch.  Questionable tiny nondisplaced fracture of the tibial spine (sagittal reconstructed images series 601 image 80).   Joint  spaces appear aligned. Moderate lipohemarthrosis.SABRA    SOFT TISSUES: Mild soft tissue swelling of the knee. Vascular calcifications.    Impression:     1.    Acute comminuted and mildly displaced periprosthetic fracture of the right medial condyle with extension to the trochlear and intercondylar notch. Moderate lipohemarthrosis. 2.    Questionable nondisplaced fracture of the lateral tibial spine. 3.    Prior postsurgical changes of partially visualized endoprosthesis of the femur without hardware complication.  Preliminary findings were published to the electronic medical record by Reyes Caprio, MD.  COMMUNICATION:  None.  Prelim reconciliation: RPR3 - Agree  The images were reviewed and interpreted by Birdia Ames.   XR ANKLE RIGHT AP AND LATERAL [[id number]] Collected: 06/27/24 1735   Order Status: Completed Updated: 06/27/24 2206   Narrative:     PROCEDURE:  XR ANKLE RIGHT AP AND LATERAL, XR TIBIA FIBULA RIGHT AP AND LATERAL, XR KNEE RIGHT AP AND LATERAL, XR FOOT RIGHT AP AND LATERAL, XR FEMUR MIN 2 VIEWS RT  REASON FOR STUDY:  Pain/Deformity Following Trauma   COMPARISON: None.  FINDINGS:   Right femur: Minimally displaced periprosthetic fracture along the superior medial femoral condyle. Joint space extension is not excluded. Status post long stem right hip replacement. No joint dislocation. Lipohemarthrosis. No focal soft tissue defect.  Right knee: No acute fracture or dislocation. Joints are in normal alignment.  No focal soft tissue defect.  Right tibia/fibula: No acute fracture or dislocation. Joints are in normal alignment.  No focal soft tissue defect.  Right ankle: No acute fracture or dislocation. Joints are in normal alignment.  No focal soft tissue defect.  Right foot: No acute fracture or dislocation. Joints are in normal alignment.  No focal soft tissue defect.    Impression:     Minimally displaced periprosthetic fracture along the  superior medial femoral condyle. Joint space extension is not excluded.  The images were reviewed and interpreted by Donnice Gains.   XR FEMUR  MIN 2 VIEWS RT [[id number]] Collected: 06/27/24 1735   Order Status: Completed Updated: 06/27/24 2206   Narrative:     PROCEDURE:  XR ANKLE RIGHT AP AND LATERAL, XR TIBIA FIBULA RIGHT AP AND LATERAL, XR KNEE RIGHT AP AND LATERAL, XR FOOT RIGHT AP AND LATERAL, XR FEMUR MIN 2 VIEWS RT  REASON FOR STUDY:  Pain/Deformity Following Trauma   COMPARISON: None.  FINDINGS:   Right femur: Minimally displaced periprosthetic fracture along the superior medial femoral condyle. Joint space extension is not excluded. Status post long stem right hip replacement. No joint dislocation. Lipohemarthrosis. No focal soft tissue defect.  Right knee: No acute fracture or dislocation. Joints are in normal alignment.  No focal soft tissue defect.  Right tibia/fibula: No acute fracture or dislocation. Joints are in normal alignment.  No focal soft tissue defect.  Right ankle: No acute fracture or dislocation. Joints are in normal alignment.  No focal soft tissue defect.  Right foot: No acute fracture or dislocation. Joints are in normal alignment.  No focal soft tissue defect.    Impression:     Minimally displaced periprosthetic fracture along the superior medial femoral condyle. Joint space extension is not excluded.  The images were reviewed and interpreted by Donnice Gains.   XR FOOT RIGHT AP AND LATERAL [[id number]] Collected: 06/27/24 1735   Order Status: Completed Updated: 06/27/24 2206   Narrative:     PROCEDURE:  XR ANKLE RIGHT AP AND LATERAL, XR TIBIA FIBULA RIGHT AP AND LATERAL, XR KNEE RIGHT AP AND LATERAL, XR FOOT RIGHT AP AND LATERAL, XR FEMUR MIN 2 VIEWS RT  REASON FOR STUDY:  Pain/Deformity Following Trauma   COMPARISON: None.  FINDINGS:   Right femur: Minimally displaced periprosthetic fracture along the superior medial femoral  condyle. Joint space extension is not excluded. Status post long stem right hip replacement. No joint dislocation. Lipohemarthrosis. No focal soft tissue defect.  Right knee: No acute fracture or dislocation. Joints are in normal alignment.  No focal soft tissue defect.  Right tibia/fibula: No acute fracture or dislocation. Joints are in normal alignment.  No focal soft tissue defect.  Right ankle: No acute fracture or dislocation. Joints are in normal alignment.  No focal soft tissue defect.  Right foot: No acute fracture or dislocation. Joints are in normal alignment.  No focal soft tissue defect.    Impression:     Minimally displaced periprosthetic fracture along the superior medial femoral condyle. Joint space extension is not excluded.  The images were reviewed and interpreted by Donnice Gains.   XR KNEE RIGHT AP AND LATERAL [[id number]] Collected: 06/27/24 1735   Order Status: Completed Updated: 06/27/24 2206   Narrative:     PROCEDURE:  XR ANKLE RIGHT AP AND LATERAL, XR TIBIA FIBULA RIGHT AP AND LATERAL, XR KNEE RIGHT AP AND LATERAL, XR FOOT RIGHT AP AND LATERAL, XR FEMUR MIN 2 VIEWS RT  REASON FOR STUDY:  Pain/Deformity Following Trauma   COMPARISON: None.  FINDINGS:   Right femur: Minimally displaced periprosthetic fracture along the superior medial femoral condyle. Joint space extension is not excluded. Status post long stem right hip replacement. No joint dislocation. Lipohemarthrosis. No focal soft tissue defect.  Right knee: No acute fracture or dislocation. Joints are in normal alignment.  No focal soft tissue defect.  Right tibia/fibula: No acute fracture or dislocation. Joints are in normal alignment.  No focal soft tissue defect.  Right ankle: No acute fracture or dislocation. Joints are in normal alignment.  No  focal soft tissue defect.  Right foot: No acute fracture or dislocation. Joints are in normal alignment.  No focal soft  tissue defect.    Impression:     Minimally displaced periprosthetic fracture along the superior medial femoral condyle. Joint space extension is not excluded.  The images were reviewed and interpreted by Donnice Gains.   XR TIBIA FIBULA RIGHT AP AND LATERAL [[id number]] Collected: 06/27/24 1735   Order Status: Completed Updated: 06/27/24 2206   Narrative:     PROCEDURE:  XR ANKLE RIGHT AP AND LATERAL, XR TIBIA FIBULA RIGHT AP AND LATERAL, XR KNEE RIGHT AP AND LATERAL, XR FOOT RIGHT AP AND LATERAL, XR FEMUR MIN 2 VIEWS RT  REASON FOR STUDY:  Pain/Deformity Following Trauma   COMPARISON: None.  FINDINGS:   Right femur: Minimally displaced periprosthetic fracture along the superior medial femoral condyle. Joint space extension is not excluded. Status post long stem right hip replacement. No joint dislocation. Lipohemarthrosis. No focal soft tissue defect.  Right knee: No acute fracture or dislocation. Joints are in normal alignment.  No focal soft tissue defect.  Right tibia/fibula: No acute fracture or dislocation. Joints are in normal alignment.  No focal soft tissue defect.  Right ankle: No acute fracture or dislocation. Joints are in normal alignment.  No focal soft tissue defect.  Right foot: No acute fracture or dislocation. Joints are in normal alignment.  No focal soft tissue defect.    Impression:     Minimally displaced periprosthetic fracture along the superior medial femoral condyle. Joint space extension is not excluded.  The images were reviewed and interpreted by Donnice Gains.   XR ANKLE LEFT AP AND LATERAL [[id number]] Collected: 06/27/24 1742   Order Status: Completed Updated: 06/27/24 2203   Narrative:     PROCEDURE:  XR TIBIA FIBULA LEFT AP AND LATERAL, XR KNEE LEFT AP AND LATERAL, XR ANKLE LEFT AP AND LATERAL  REASON FOR STUDY:  Pain/Deformity Following Trauma   COMPARISON: None.  FINDINGS:   Left knee: No acute fracture or  dislocation. Joints are in normal alignment.  No focal soft tissue defect.  Left tibia/fibula: No acute fracture or dislocation. Joints are in normal alignment.  No focal soft tissue defect.  Left ankle: No acute fracture or dislocation. Joints are in normal alignment.  No focal soft tissue defect.    Impression:     No acute osseous abnormality.     The images were reviewed and interpreted by Donnice Gains.   XR TIBIA FIBULA LEFT AP AND LATERAL [[id number]] Collected: 06/27/24 1742   Order Status: Completed Updated: 06/27/24 2203   Narrative:     PROCEDURE:  XR TIBIA FIBULA LEFT AP AND LATERAL, XR KNEE LEFT AP AND LATERAL, XR ANKLE LEFT AP AND LATERAL  REASON FOR STUDY:  Pain/Deformity Following Trauma   COMPARISON: None.  FINDINGS:   Left knee: No acute fracture or dislocation. Joints are in normal alignment.  No focal soft tissue defect.  Left tibia/fibula: No acute fracture or dislocation. Joints are in normal alignment.  No focal soft tissue defect.  Left ankle: No acute fracture or dislocation. Joints are in normal alignment.  No focal soft tissue defect.    Impression:     No acute osseous abnormality.     The images were reviewed and interpreted by Donnice Gains.   XR KNEE LEFT AP AND LATERAL [[id number]] Collected: 06/27/24 1742   Order Status: Completed Updated: 06/27/24 2203   Narrative:     PROCEDURE:  XR TIBIA FIBULA LEFT AP AND LATERAL, XR KNEE LEFT AP AND LATERAL, XR ANKLE LEFT AP AND LATERAL  REASON FOR STUDY:  Pain/Deformity Following Trauma   COMPARISON: None.  FINDINGS:   Left knee: No acute fracture or dislocation. Joints are in normal alignment.  No focal soft tissue defect.  Left tibia/fibula: No acute fracture or dislocation. Joints are in normal alignment.  No focal soft tissue defect.  Left ankle: No acute fracture or dislocation. Joints are in normal alignment.  No focal soft tissue defect.    Impression:      No acute osseous abnormality.     The images were reviewed and interpreted by Donnice Gains.   CT Brain C Spine wo Contrast [[id number]] Collected: 06/27/24 1655   Order Status: Completed Updated: 06/27/24 1749   Narrative:     EXAM: CT BRAIN C SPINE WO CONTRAST  CLINICAL INDICATION:   trauma  HEAD AND CERVICAL SPINE CTs: Noncontrast axial images. Axial, sagittal, and coronal reconstructed images.  COMPARISON:  None.  FINDINGS:   Support Devices and Implants: None  HEAD:  Brain: Prominent bilateral cerebral white matter low attenuation that is nonspecific but likely due to chronic ischemic microangiopathy.  Bilateral lateral ventricular bodies and ventricular atria secondary to asymmetric central volume loss.  No intraparenchymal hemorrhage.   No herniation.  Extra-axial Spaces: No epidural, subdural, or subarachnoid hemorrhage.  Ventricles: No hydrocephalus.  Bones/Soft Tissues: No calvarial  fracture.  Superior right frontoparietal large subgaleal scalp hematoma.  Orbits, Included: No injury. Bilateral pseudophakia.  Paranasal Sinuses, Included: No fluid. Postinflammatory left sphenoid sinus mucoperiosteal thickening in right sphenoid sinus webs. Bilateral maxillary sinus postinflammatory mucoperiosteal thickening. Right posterior ethmoid mucous retention cyst.  Tympanomastoid Cavities: Minimal bilateral mastoid effusions.  Other: Bilateral internal carotid and bilateral vertebral artery atherosclerotic calcifications.  CERVICAL SPINE:  Bones: No acute fracture.  Alignment: Normal.  Degenerative Disease: Multiple sites of mild to moderately severe spondylosis. Focal ossification in the nuchal ligament.  Soft Tissues: Locally enlarged thyroid  with multiple nodules better detected by concurrent chest CT. Largest nodule measures 1.6 cm and is in the left lobe.  Upper Chest: Normal lung apices.  Other: Internal carotid artery atherosclerotic  calcifications.  IMPRESSION:  1.    No evidence of acute intracranial injury.  2.    No acute cervical spine fracture.    3.    Goiter. Based on the 1.6 cm largest nodule, further evaluation with a nonemergent ultrasound is recommended if not already performed. Recommendation(s) based on ACR 2015 White Paper on Managing Incidental Thyroid  Nodules Detected on Imaging.  ----------   Preliminary findings were published to the electronic medical record by Reyes Caprio, MD.  COMMUNICATION:  None.  Prelim reconciliation: RPR2   Agree with Incidental Finding  INCIDENTAL FOLLOW-UP The following impression points have been identified for follow up: # 2.  The images were reviewed and interpreted by Quita LELON Charters, MD.   CT Trauma Chest Abdomen Pelvis w Contrast [[id number]] Collected: 06/27/24 1705   Order Status: Completed Updated: 06/27/24 1728   Narrative:     EXAM: CT TRAUMA ANGIOGRAM CHEST ABDOMEN PELVIS  CLINICAL INDICATION:  trauma  CHEST AND ABDOMEN-PELVIS CTAs: Helical multidetector scanning was performed in the arterial phase after the intravenous administration of intravenous contrast. Scanning of the abdomen and pelvis was then performed in the portal venous phase. Axial,  sagittal and coronal reconstructed images were reviewed. 3D reconstructions with MIP images were performed.  COMPARISON: None available  FINDINGS:  Support  Devices:  None.   CHEST:  Airways/Lungs/Pleura: Patent airways.  No pulmonary contusions or lacerations.  Left upper lobe calcified granuloma.  No pleural fluid.  No pneumothorax.  Vascular: No acute injury.  Aorta atherosclerotic plaque. Multivessel coronary artery atherosclerotic calcifications.  Mediastinum: No hemomediastinum.  Heart size normal.  No pericardial fluid.  Lymph Nodes: Mediastinal and left hilar calcified lymph nodes.  Bones, including Thoracic Spine: No acute fracture.  Normal thoracic spine alignment.  Extensive  vertebral ankylosis secondary to diffuse idiopathic skeletal hyperostosis.  Soft Tissues: Normal.   Other:  None.  ABDOMEN-PELVIS:  Liver: No injury.    Multiple predominantly low attenuation masses throughout the liver are mostly subcentimeter with others that range up in size to 3.6 cm x 2.1 cm x 1.7 cm (segment IV). Most are well-defined, but several subcentimeter lesions in the right lobe are faint  and poorly defined.  Gallbladder/Biliary Tree: No intrahepatic or extrahepatic biliary ductal dilatation.  Normal gallbladder.  Spleen: No injury.  Splenic calcified granulomas.   Pancreas: [No injury.]  [Normal.]  Stomach/Bowel/Mesentery: No injury.  Normal.  Adrenal Glands: No injury.  Normal.  Kidneys/Ureters: No injury.  Bilateral renal cortical thinning.  Bilateral collecting system nephrolithiasis without hydronephrosis.  Urinary Bladder: No injury.  Normal.  Reproductive Organs: Region of prostate bed partially obscured by artifact from right hip arthroplasty. Prostatectomy and bilateral pelvic sidewall surgical clips.  Lymph Nodes: No lymphadenopathy. Portacaval calcified lymph node.   Peritoneum/Retroperitoneum: No free fluid.  No retroperitoneal hematoma.  Vessels: No acute injury.  No aortoiliac aneurysm.  Aortoiliac atherosclerosis.  Portal, splenic and superior mesenteric veins are patent.  Bones, including Lumbar Spine: No acute fracture or dislocation.  Normal spinal alignment.    Multilevel spondylosis. L5 laminectomy defects. L5-S1 bilateral posterior rod and screw fixation with interbody spacer from discectomy.  Right hip arthroplasty with long femoral stem related to limb sparing femoral resection.  Soft Tissues: Normal.  Other: None.  IMPRESSION:   1.    No acute thoracic, abdominal, or pelvic injury.    2.    Multiple liver masses ranging from subcentimeter to 3.6 cm in size. Most have a nonaggressive appearance that suggests multiple benign  cysts or biliary hamartomas. However, malignancy cannot be excluded. If there are no prior studies with which  comparison can document long-term stability, further evaluation with a nonemergent MRI without/with contrast is recommended.  ----------  INCIDENTAL FOLLOW-UP:  The following impression points have been identified for follow up: # 2.        Out: 670 [Urine:600]  Review of systems: Pain:Well controlled Nausea/Vomiting:No Bowel Movement :No Respiratory distress:No   LUE: no deformity, effusions, soft tissue swelling or discoloration, no lacerations or abrasions, no crepitus palpated, compartments soft and compressible, SILT M/U/R, AIN/PIN/M/U/R motor intact, 2 + RP, brisk cap refill x 5  FROM of joints without pain             Shoulder: no ttp, FROM without pain, no apprehension             Elbow: no ttp, FROM without pain, no identifiable instability or lig laxity             Wrist: no ttp, FROM             Fingers: no ttp, FROM without pain  RUE:  no deformity, effusions, soft tissue swelling or discoloration, no lacerations or abrasions, no crepitus palpated, compartments soft and compressible, SILT M/U/R, AIN/PIN/M/U/R motor intact, 2 + RP, brisk  cap refill x 5  FROM of joints without pain             Shoulder: no ttp, FROM without pain, no apprehension             Elbow: no ttp, FROM without pain, no identifiable instability or lig laxity             Wrist: no ttp, FROM             Fingers: no ttp, FROM without pain  Pelvis: no soft tissue swelling or discoloration, no palpable widening of symphysis,  stable to rocking  LLE: no deformity effusions, mild swelling on the lateral aspect of the left ankle, no crepitus palpated, compartments soft and compressible  SILT Su/Sa/SP/DP/T   +EHL/EDL/FHL/FDL/GSC/TA   2+ DP, brisk cap refill x 5 FROM of joints without pain             Hip: no ttp, no pain with log roll             Knee: no ttp, +SLR, FROM without pain, no  lig laxity             Ankle: moderate ttp to squeeze, FROM without pain, no instability             Toes: no ttp, FROM without pain   RLE: KI in place, no wounds or bleeding appreciated, appropriate swelling  Compartments are soft and compressible, appropriately ttp SILT S/Su/SPN/DPN/T  Motor +EHL/FHL/GSC/TA  Foot wwp, 2+DP             Ankle: no ttp, FROM without pain, no instability             Toes: no ttp, FROM without pain  XR order: None needed, L ankle XR reviewed and no evidence of acute fracture  Lab: Recent Labs    06/27/24 1538  WBC 9.8  RBC 5.41  HGB 15.1  HCT 46.9  PLT 173  PT 15.6*  APTT 26.7  INR 1.4*   Lipids:      Cardiac Enzymes:        Documentation of radiology results: Radiology Results (last 1 day)     Procedure Component Value Units Date/Time   CT Knee Right wo Contrast [[id number]] Collected: 06/27/24 1721   Order Status: Completed Updated: 06/28/24 1404   Narrative:     EXAM: CT KNEE RIGHT WO CONTRAST  TECHNIQUE: CT extremity images of the right knee without contrast were obtained. Sagittal and coronal reformatted images were reviewed.   CLINICAL INDICATION:  trauma. Ground level fall with periprosthetic fracture.   COMPARISON: Same day radiographs.  FINDINGS:    BONES AND JOINTS: Partially visualized right femur endoprosthesis without identified hardware complication. Acute comminuted and mildly displaced periprosthetic fracture involving the medial condyle with a vertical component adjacent to the hardware bone  at junction. The fracture lucency extends to the trochlea and intercondylar notch.  Questionable tiny nondisplaced fracture of the tibial spine (sagittal reconstructed images series 601 image 80).   Joint spaces appear aligned. Moderate lipohemarthrosis.SABRA    SOFT TISSUES: Mild soft tissue swelling of the knee. Vascular calcifications.    Impression:     1.    Acute comminuted and mildly displaced periprosthetic fracture of  the right medial condyle with extension to the trochlear and intercondylar notch. Moderate lipohemarthrosis. 2.    Questionable nondisplaced fracture of the lateral tibial spine. 3.    Prior postsurgical changes of partially visualized endoprosthesis of the femur  without hardware complication.  Preliminary findings were published to the electronic medical record by Reyes Caprio, MD.  COMMUNICATION:  None.  Prelim reconciliation: RPR3 - Agree  The images were reviewed and interpreted by Birdia Ames.   XR ANKLE RIGHT AP AND LATERAL [[id number]] Collected: 06/27/24 1735   Order Status: Completed Updated: 06/27/24 2206   Narrative:     PROCEDURE:  XR ANKLE RIGHT AP AND LATERAL, XR TIBIA FIBULA RIGHT AP AND LATERAL, XR KNEE RIGHT AP AND LATERAL, XR FOOT RIGHT AP AND LATERAL, XR FEMUR MIN 2 VIEWS RT  REASON FOR STUDY:  Pain/Deformity Following Trauma   COMPARISON: None.  FINDINGS:   Right femur: Minimally displaced periprosthetic fracture along the superior medial femoral condyle. Joint space extension is not excluded. Status post long stem right hip replacement. No joint dislocation. Lipohemarthrosis. No focal soft tissue defect.  Right knee: No acute fracture or dislocation. Joints are in normal alignment.  No focal soft tissue defect.  Right tibia/fibula: No acute fracture or dislocation. Joints are in normal alignment.  No focal soft tissue defect.  Right ankle: No acute fracture or dislocation. Joints are in normal alignment.  No focal soft tissue defect.  Right foot: No acute fracture or dislocation. Joints are in normal alignment.  No focal soft tissue defect.    Impression:     Minimally displaced periprosthetic fracture along the superior medial femoral condyle. Joint space extension is not excluded.  The images were reviewed and interpreted by Donnice Gains.   XR FEMUR MIN 2 VIEWS RT [[id number]] Collected: 06/27/24 1735   Order Status: Completed  Updated: 06/27/24 2206   Narrative:     PROCEDURE:  XR ANKLE RIGHT AP AND LATERAL, XR TIBIA FIBULA RIGHT AP AND LATERAL, XR KNEE RIGHT AP AND LATERAL, XR FOOT RIGHT AP AND LATERAL, XR FEMUR MIN 2 VIEWS RT  REASON FOR STUDY:  Pain/Deformity Following Trauma   COMPARISON: None.  FINDINGS:   Right femur: Minimally displaced periprosthetic fracture along the superior medial femoral condyle. Joint space extension is not excluded. Status post long stem right hip replacement. No joint dislocation. Lipohemarthrosis. No focal soft tissue defect.  Right knee: No acute fracture or dislocation. Joints are in normal alignment.  No focal soft tissue defect.  Right tibia/fibula: No acute fracture or dislocation. Joints are in normal alignment.  No focal soft tissue defect.  Right ankle: No acute fracture or dislocation. Joints are in normal alignment.  No focal soft tissue defect.  Right foot: No acute fracture or dislocation. Joints are in normal alignment.  No focal soft tissue defect.    Impression:     Minimally displaced periprosthetic fracture along the superior medial femoral condyle. Joint space extension is not excluded.  The images were reviewed and interpreted by Donnice Gains.   XR FOOT RIGHT AP AND LATERAL [[id number]] Collected: 06/27/24 1735   Order Status: Completed Updated: 06/27/24 2206   Narrative:     PROCEDURE:  XR ANKLE RIGHT AP AND LATERAL, XR TIBIA FIBULA RIGHT AP AND LATERAL, XR KNEE RIGHT AP AND LATERAL, XR FOOT RIGHT AP AND LATERAL, XR FEMUR MIN 2 VIEWS RT  REASON FOR STUDY:  Pain/Deformity Following Trauma   COMPARISON: None.  FINDINGS:   Right femur: Minimally displaced periprosthetic fracture along the superior medial femoral condyle. Joint space extension is not excluded. Status post long stem right hip replacement. No joint dislocation. Lipohemarthrosis. No focal soft tissue defect.  Right knee: No acute fracture or dislocation. Joints are  in  normal alignment.  No focal soft tissue defect.  Right tibia/fibula: No acute fracture or dislocation. Joints are in normal alignment.  No focal soft tissue defect.  Right ankle: No acute fracture or dislocation. Joints are in normal alignment.  No focal soft tissue defect.  Right foot: No acute fracture or dislocation. Joints are in normal alignment.  No focal soft tissue defect.    Impression:     Minimally displaced periprosthetic fracture along the superior medial femoral condyle. Joint space extension is not excluded.  The images were reviewed and interpreted by Donnice Gains.   XR KNEE RIGHT AP AND LATERAL [[id number]] Collected: 06/27/24 1735   Order Status: Completed Updated: 06/27/24 2206   Narrative:     PROCEDURE:  XR ANKLE RIGHT AP AND LATERAL, XR TIBIA FIBULA RIGHT AP AND LATERAL, XR KNEE RIGHT AP AND LATERAL, XR FOOT RIGHT AP AND LATERAL, XR FEMUR MIN 2 VIEWS RT  REASON FOR STUDY:  Pain/Deformity Following Trauma   COMPARISON: None.  FINDINGS:   Right femur: Minimally displaced periprosthetic fracture along the superior medial femoral condyle. Joint space extension is not excluded. Status post long stem right hip replacement. No joint dislocation. Lipohemarthrosis. No focal soft tissue defect.  Right knee: No acute fracture or dislocation. Joints are in normal alignment.  No focal soft tissue defect.  Right tibia/fibula: No acute fracture or dislocation. Joints are in normal alignment.  No focal soft tissue defect.  Right ankle: No acute fracture or dislocation. Joints are in normal alignment.  No focal soft tissue defect.  Right foot: No acute fracture or dislocation. Joints are in normal alignment.  No focal soft tissue defect.    Impression:     Minimally displaced periprosthetic fracture along the superior medial femoral condyle. Joint space extension is not excluded.  The images were reviewed and interpreted by Donnice Gains.    XR TIBIA FIBULA RIGHT AP AND LATERAL [[id number]] Collected: 06/27/24 1735   Order Status: Completed Updated: 06/27/24 2206   Narrative:     PROCEDURE:  XR ANKLE RIGHT AP AND LATERAL, XR TIBIA FIBULA RIGHT AP AND LATERAL, XR KNEE RIGHT AP AND LATERAL, XR FOOT RIGHT AP AND LATERAL, XR FEMUR MIN 2 VIEWS RT  REASON FOR STUDY:  Pain/Deformity Following Trauma   COMPARISON: None.  FINDINGS:   Right femur: Minimally displaced periprosthetic fracture along the superior medial femoral condyle. Joint space extension is not excluded. Status post long stem right hip replacement. No joint dislocation. Lipohemarthrosis. No focal soft tissue defect.  Right knee: No acute fracture or dislocation. Joints are in normal alignment.  No focal soft tissue defect.  Right tibia/fibula: No acute fracture or dislocation. Joints are in normal alignment.  No focal soft tissue defect.  Right ankle: No acute fracture or dislocation. Joints are in normal alignment.  No focal soft tissue defect.  Right foot: No acute fracture or dislocation. Joints are in normal alignment.  No focal soft tissue defect.    Impression:     Minimally displaced periprosthetic fracture along the superior medial femoral condyle. Joint space extension is not excluded.  The images were reviewed and interpreted by Donnice Gains.   XR ANKLE LEFT AP AND LATERAL [[id number]] Collected: 06/27/24 1742   Order Status: Completed Updated: 06/27/24 2203   Narrative:     PROCEDURE:  XR TIBIA FIBULA LEFT AP AND LATERAL, XR KNEE LEFT AP AND LATERAL, XR ANKLE LEFT AP AND LATERAL  REASON FOR STUDY:  Pain/Deformity Following Trauma   COMPARISON:  None.  FINDINGS:   Left knee: No acute fracture or dislocation. Joints are in normal alignment.  No focal soft tissue defect.  Left tibia/fibula: No acute fracture or dislocation. Joints are in normal alignment.  No focal soft tissue defect.  Left ankle: No acute fracture or  dislocation. Joints are in normal alignment.  No focal soft tissue defect.    Impression:     No acute osseous abnormality.     The images were reviewed and interpreted by Donnice Gains.   XR TIBIA FIBULA LEFT AP AND LATERAL [[id number]] Collected: 06/27/24 1742   Order Status: Completed Updated: 06/27/24 2203   Narrative:     PROCEDURE:  XR TIBIA FIBULA LEFT AP AND LATERAL, XR KNEE LEFT AP AND LATERAL, XR ANKLE LEFT AP AND LATERAL  REASON FOR STUDY:  Pain/Deformity Following Trauma   COMPARISON: None.  FINDINGS:   Left knee: No acute fracture or dislocation. Joints are in normal alignment.  No focal soft tissue defect.  Left tibia/fibula: No acute fracture or dislocation. Joints are in normal alignment.  No focal soft tissue defect.  Left ankle: No acute fracture or dislocation. Joints are in normal alignment.  No focal soft tissue defect.    Impression:     No acute osseous abnormality.     The images were reviewed and interpreted by Donnice Gains.   XR KNEE LEFT AP AND LATERAL [[id number]] Collected: 06/27/24 1742   Order Status: Completed Updated: 06/27/24 2203   Narrative:     PROCEDURE:  XR TIBIA FIBULA LEFT AP AND LATERAL, XR KNEE LEFT AP AND LATERAL, XR ANKLE LEFT AP AND LATERAL  REASON FOR STUDY:  Pain/Deformity Following Trauma   COMPARISON: None.  FINDINGS:   Left knee: No acute fracture or dislocation. Joints are in normal alignment.  No focal soft tissue defect.  Left tibia/fibula: No acute fracture or dislocation. Joints are in normal alignment.  No focal soft tissue defect.  Left ankle: No acute fracture or dislocation. Joints are in normal alignment.  No focal soft tissue defect.    Impression:     No acute osseous abnormality.     The images were reviewed and interpreted by Donnice Gains.   CT Brain C Spine wo Contrast [[id number]] Collected: 06/27/24 1655   Order Status: Completed Updated: 06/27/24 1749   Narrative:      EXAM: CT BRAIN C SPINE WO CONTRAST  CLINICAL INDICATION:   trauma  HEAD AND CERVICAL SPINE CTs: Noncontrast axial images. Axial, sagittal, and coronal reconstructed images.  COMPARISON:  None.  FINDINGS:   Support Devices and Implants: None  HEAD:  Brain: Prominent bilateral cerebral white matter low attenuation that is nonspecific but likely due to chronic ischemic microangiopathy.  Bilateral lateral ventricular bodies and ventricular atria secondary to asymmetric central volume loss.  No intraparenchymal hemorrhage.   No herniation.  Extra-axial Spaces: No epidural, subdural, or subarachnoid hemorrhage.  Ventricles: No hydrocephalus.  Bones/Soft Tissues: No calvarial  fracture.  Superior right frontoparietal large subgaleal scalp hematoma.  Orbits, Included: No injury. Bilateral pseudophakia.  Paranasal Sinuses, Included: No fluid. Postinflammatory left sphenoid sinus mucoperiosteal thickening in right sphenoid sinus webs. Bilateral maxillary sinus postinflammatory mucoperiosteal thickening. Right posterior ethmoid mucous retention cyst.  Tympanomastoid Cavities: Minimal bilateral mastoid effusions.  Other: Bilateral internal carotid and bilateral vertebral artery atherosclerotic calcifications.  CERVICAL SPINE:  Bones: No acute fracture.  Alignment: Normal.  Degenerative Disease: Multiple sites of mild to moderately severe spondylosis. Focal ossification in the nuchal ligament.  Soft Tissues: Locally enlarged thyroid  with multiple nodules better detected by concurrent chest CT. Largest nodule measures 1.6 cm and is in the left lobe.  Upper Chest: Normal lung apices.  Other: Internal carotid artery atherosclerotic calcifications.  IMPRESSION:  1.    No evidence of acute intracranial injury.  2.    No acute cervical spine fracture.    3.    Goiter. Based on the 1.6 cm largest nodule, further evaluation with a nonemergent ultrasound is recommended if not  already performed. Recommendation(s) based on ACR 2015 White Paper on Managing Incidental Thyroid  Nodules Detected on Imaging.  ----------   Preliminary findings were published to the electronic medical record by Reyes Caprio, MD.  COMMUNICATION:  None.  Prelim reconciliation: RPR2   Agree with Incidental Finding  INCIDENTAL FOLLOW-UP The following impression points have been identified for follow up: # 2.  The images were reviewed and interpreted by Quita LELON Charters, MD.   CT Trauma Chest Abdomen Pelvis w Contrast [[id number]] Collected: 06/27/24 1705   Order Status: Completed Updated: 06/27/24 1728   Narrative:     EXAM: CT TRAUMA ANGIOGRAM CHEST ABDOMEN PELVIS  CLINICAL INDICATION:  trauma  CHEST AND ABDOMEN-PELVIS CTAs: Helical multidetector scanning was performed in the arterial phase after the intravenous administration of intravenous contrast. Scanning of the abdomen and pelvis was then performed in the portal venous phase. Axial,  sagittal and coronal reconstructed images were reviewed. 3D reconstructions with MIP images were performed.  COMPARISON: None available  FINDINGS:  Support Devices:  None.   CHEST:  Airways/Lungs/Pleura: Patent airways.  No pulmonary contusions or lacerations.  Left upper lobe calcified granuloma.  No pleural fluid.  No pneumothorax.  Vascular: No acute injury.  Aorta atherosclerotic plaque. Multivessel coronary artery atherosclerotic calcifications.  Mediastinum: No hemomediastinum.  Heart size normal.  No pericardial fluid.  Lymph Nodes: Mediastinal and left hilar calcified lymph nodes.  Bones, including Thoracic Spine: No acute fracture.  Normal thoracic spine alignment.  Extensive vertebral ankylosis secondary to diffuse idiopathic skeletal hyperostosis.  Soft Tissues: Normal.   Other:  None.  ABDOMEN-PELVIS:  Liver: No injury.    Multiple predominantly low attenuation masses throughout the liver are mostly subcentimeter  with others that range up in size to 3.6 cm x 2.1 cm x 1.7 cm (segment IV). Most are well-defined, but several subcentimeter lesions in the right lobe are faint  and poorly defined.  Gallbladder/Biliary Tree: No intrahepatic or extrahepatic biliary ductal dilatation.  Normal gallbladder.  Spleen: No injury.  Splenic calcified granulomas.   Pancreas: [No injury.]  [Normal.]  Stomach/Bowel/Mesentery: No injury.  Normal.  Adrenal Glands: No injury.  Normal.  Kidneys/Ureters: No injury.  Bilateral renal cortical thinning.  Bilateral collecting system nephrolithiasis without hydronephrosis.  Urinary Bladder: No injury.  Normal.  Reproductive Organs: Region of prostate bed partially obscured by artifact from right hip arthroplasty. Prostatectomy and bilateral pelvic sidewall surgical clips.  Lymph Nodes: No lymphadenopathy. Portacaval calcified lymph node.   Peritoneum/Retroperitoneum: No free fluid.  No retroperitoneal hematoma.  Vessels: No acute injury.  No aortoiliac aneurysm.  Aortoiliac atherosclerosis.  Portal, splenic and superior mesenteric veins are patent.  Bones, including Lumbar Spine: No acute fracture or dislocation.  Normal spinal alignment.    Multilevel spondylosis. L5 laminectomy defects. L5-S1 bilateral posterior rod and screw fixation with interbody spacer from discectomy.  Right hip arthroplasty with long femoral stem related to limb sparing femoral resection.  Soft Tissues: Normal.  Other: None.  IMPRESSION:   1.  No acute thoracic, abdominal, or pelvic injury.    2.    Multiple liver masses ranging from subcentimeter to 3.6 cm in size. Most have a nonaggressive appearance that suggests multiple benign cysts or biliary hamartomas. However, malignancy cannot be excluded. If there are no prior studies with which  comparison can document long-term stability, further evaluation with a nonemergent MRI without/with contrast is  recommended.  ----------  INCIDENTAL FOLLOW-UP:  The following impression points have been identified for follow up: # 2.        C Spine Clearance ?: Yes Patient was evaluated today for clearance of C-spine precautions after consultation and evaluation of appropriate imaging.  Patient was awake and alert and cooperative for exam. Patient without neurologic symptoms or neck pain. Patient did not exhibit any focal tenderness to direct palpation of the cervical spine.  Patient was able to move head in all directions without limitation in the range of motion or without inciting additional pain or discomfort. No midline tenderness. Denies pain, weakness or numbness with flexion, extension, or rotation of the neck. Aspen collar removed. C-collar cleared by Nexus Criteria.  Based on this examination, C-spine precautions are no longer required and may be discontinued.  T & L Spine Clearance?:  Patient was evaluated today for clearance of thoracic and lumbar spine precautions after consultation and evaluation of appropriate imaging.  Patient with no documented thoracic or lumbar spine fractures and thoracic and lumbar spine is cleared.     Consultants: none  Incidental radiologic findings: none  Injury complex with plans:   - R Knee:  Minimally displaced periprosthetic fracture along the superior medial femoral condyle. Status post long stem right hip replacement. - Plan per Ortho (primary)  Tertiary complete, no further imaging recommended. Trauma 1will sign off.   Dispo: per ortho   Corean Hint Medical Student [facility] Trauma 1 Team    Contacts: IOU: [phone]  [facility] ACS Team [phone] / [phone]  (7pm-7am)  [facility] ACS Team [phone]   (7pm-7am)   ACS APP Team - all non-SICU Team 1 and 2 patients on 4B, 5B, and 6B   4B: [phone]  (7am-7pm)  5B: [phone]  (7am-7pm) 6B:  [phone]  (7am-7pm)  MTC Team [phone] (junior) / [phone]  (senior)    9B [phone]   10B [phone]    Electronically signed by Hint Corean, Medical Student at 06/28/2024  9:41 PM EDT

## (undated) NOTE — Progress Notes (Signed)
 Formatting of this note is different from the original. Orthopaedic Progress Note  Patient Info: Name: Bruce Ball  Admission Date: 06/27/2024  3:16 PM  MRN: [id number] Hospital Day: Day 10 of ? (NO DRG)   Room/Bed: 5B32/01 DOB: 12/10/1946   Age: 32 y.o.    Interval Update / Subjective  - No acute events overnight.  - No new numbness, tingling, weakness.  - Pain controlled  - Denies Nausea, Vomiting, Diarrhea, Chest pain, and SOB - Talked with wife at [facility] with patient and recommended they schedule follow-up with their previous orthopedist at home in [facility] while they are here.  - Awaiting SAR placement  Objective   Temp:  [36.6 C (97.9 F)-36.9 C (98.5 F)] 36.8 C (98.3 F) Heart Rate:  [63-81] 63 Respirations:  [18] 18 Blood Pressure: (118-160)/(55-74) 128/74  Labs: Recent Labs  Lab 07/03/24 0627 07/05/24 2345  NA 141 141  K 3.8 3.9  CL 105 105  CO2 28 27  BUN 26* 29*  CREATININE 1.2 1.1  GLU 102 99   Recent Labs  Lab 07/03/24 0627 07/05/24 2345  WBC 9.6 8.4  HGB 13.4 12.5*  PLT 199 215   No results for input(s): INR in the last 168 hours.  I/O I/O last 3 completed shifts: In: 660 [P.O.:660] Out: 1400 [Urine:1400] No intake/output data recorded.  Gen: NAD, AOx3 Pulm: Non-labored breathing Cards: Regular rate and rhythm   RLE: Dressings c/d/I, HKB @ 20 degrees SILT sp/dp/t/sa/su Motor +ehl/fhl/ta/gsc Compartments soft, compressible Toes wwp, BCR x5 2+ DP pulse  Assessment   Bruce Ball is a 36 y.o. male s/p GLF.  Injury/Diagnosis Complex  - R periprosthetic MFC fx  Surgeries - R femur ORIF on 07/01/24 with Dr. Jackquelyn Salines Problem List Active Hospital Problems   Diagnosis Date Noted   DVT (deep venous thrombosis) (CMS-HCC) 06/28/2024   HTN (hypertension), benign 06/28/2024   Acid reflux 06/28/2024   CHF (congestive heart failure) (CMS-HCC) 06/28/2024   Renal disease 06/28/2024   Closed displaced fracture of condyle of right femur  (CMS-HCC) 06/27/2024   Trauma 06/27/2024   Plan  General  -IS - Monitor & replete lytes PRN - BR  Acute Pain - Multimodal analgesia  - Adjust PRN  RLE - Splints/Brace/Ex-fix: HKB locked at 20  - Drains/wound vacs: None - Surgery(s) complete - Follow-up: 7/22 with TL or at home in [facility] - PT/OT  Current Activity Order (From admission, onward)         Ordered    Activity - Weight Bearing and Restrictions  UNTIL DISCONTINUED       Question Answer Comment  Activity Restrictions Weight Bearing   Activity Restrictions Brace Wear Precautions   Weight bearing status RUE Weight bearing as tolerated   Weight bearing status LUE Weight bearing as tolerated   Weight bearing status RLE Non-weight bearing   Weight bearing status LLE Weight bearing as tolerated   Bracing Wear RLE   Patient to wear RLE brace At all times (specify when to remove)   When to remove RLE brace N/A     07/01/24 1411        PPX - Antibiotics: 24 Hr Periop Ancef  - IP: Lovenox  - at Discharge: Complete initial 4 weeks ASA 81mg  BID  Remainder of care per Co-Management Team W  This patient is on [facility] Co-Management Team W.  Orthopedic Surgery can be reached at pic [phone] for any questions regarding pain control or the injured/operative  extremity. [facility] Team W can be reached at pic [phone] for any questions regarding vital sign abnormalities, blood sugar, urinary retention, constipation, or any other medical concerns. In the event of a patient emergency, please page [facility] Team W at pic [phone].  Discharge Planning - Medically cleared for DC - Barrier: SAR - DC Location: SAR - PT/OT: Clearance not needed   Hosey Arrow, APRN, AGACNP-BC  [facility]    PRIMARY Ortho Trauma or Joints - PIC [phone]   ALL NEW CONSULTS - PIC [phone]  New Orthopaedic consult patients or newly found injuries  CURRENT/EXISTING PATIENTS Ortho TRAUMA CONSULTS - PIC [phone]  Ortho SPINE -  PIC [phone]  Ortho HAND - PIC [id number]  Podiatry - PIC [phone]  Electronically signed by Arrow Hosey Borer, NP at 07/07/2024 10:46 AM EDT

## (undated) NOTE — Nursing Note (Signed)
 Formatting of this note might be different from the original. Completed dressing change on the patient's R hand. Used pink bordered dressing on the R hand. Dated and initialed.  Electronically signed by Levan Rondo, RN at 07/10/2024  2:32 PM EDT

## (undated) NOTE — Nursing Note (Signed)
 Formatting of this note might be different from the original. Assuming care of pt at this time. NAD noted. VSS. Pt A&Ox4.  Bed in lowest position, side rails up 2/4, bed alarm on zone 2 . Patient stated pain level is 0.  Will continue to monitor. Shanda Metro, RN  Electronically signed by Metro Shanda, RN at 07/05/2024  8:25 AM EDT

## (undated) NOTE — Unmapped External Note (Signed)
 Formatting of this note might be different from the original.  Problem: Pain/Comfort Goal: Adequate pain control while hospitalized Outcome: Continue with Current Goal  Problem: Fall Risk Goal: Patient will remain as independent as possible Outcome: Continue with Current Goal Goal: Patient will have lower fall risk. Lower injury risk. Outcome: Continue with Current Goal Goal: Patient will remain safe from falls and injury Outcome: Continue with Current Goal Goal: Patient/family will understand fall prevention measures Outcome: Continue with Current Goal Goal: Patient/family will understand injury reduction measures Outcome: Continue with Current Goal Goal: Patient/family will comply with fall program Outcome: Continue with Current Goal Goal: Patient/family will verbalize fall prevention strategies to implement after discharge Outcome: Continue with Current Goal  Electronically signed by Evertt Grate, RN at 07/02/2024  1:35 AM EDT

## (undated) NOTE — Unmapped External Note (Signed)
 Formatting of this note is different from the original.  Problem: DVT (Risk For) Goal: Demonstrates adequate circulation status as evidenced by the following indicators: Peripheral pulses strong/Peripheral pulses symmetrical/ Peripheral edema not present Outcome: Continue with Current Goal  Problem: Knowledge Deficit Goal: Patient will verbalize and/or demonstrate understanding of teaching by discharge Outcome: Continue with Current Goal Goal: Patient will Participate in learning opportunities offered by staff Outcome: Continue with Current Goal  Problem: Infection/Isolation (Risk/Actual) Goal: Patient will be free of hospital acquired infection throughout hospitalization Outcome: Continue with Current Goal  Problem: Pain/Comfort Goal: Adequate pain control while hospitalized Outcome: Continue with Current Goal  Problem: Fall Risk Goal: Patient will remain as independent as possible Outcome: Continue with Current Goal Goal: Patient will have lower fall risk. Lower injury risk. Outcome: Continue with Current Goal Goal: Patient will remain safe from falls and injury Outcome: Continue with Current Goal Goal: Patient/family will understand fall prevention measures Outcome: Continue with Current Goal Goal: Patient/family will understand injury reduction measures Outcome: Continue with Current Goal Goal: Patient/family will comply with fall program Outcome: Continue with Current Goal Goal: Patient/family will verbalize fall prevention strategies to implement after discharge Outcome: Continue with Current Goal  Problem: Skin Alteration (Risk for/Actual) Goal: Patient?s skin integrity will remain intact Outcome: Continue with Current Goal Goal: Patient will evidence no skin breakdown Outcome: Continue with Current Goal Goal: Patient will regain integrity of skin tissue Outcome: Continue with Current Goal  Problem: Anxiety Goal: The patient's care is consistent with the  individualized perioperative plan of care. Outcome: Continue with Current Goal Goal: The patient's right to privacy is maintained. Outcome: Continue with Current Goal Goal: Patient and family demonstrates knowledge of expected response to operative or invasive procedures Outcome: Continue with Current Goal  Electronically signed by Troup, Anyssa, RN at 07/01/2024  8:28 AM EDT

## (undated) NOTE — Progress Notes (Signed)
 Formatting of this note is different from the original. Orthopaedic Progress Note  Patient Info: Name: Bruce Ball  Admission Date: 06/27/2024  3:16 PM  MRN: [id number] Hospital Day: Day 6 of ? (NO DRG)   Room/Bed: 5B32/01 DOB: 1946-09-28   Age: 45 y.o.    Interval Update / Subjective  POD2 s/p R femur ORIF on 07/01/24 w Dr. Jackquelyn  - No acute ortho events overnight.  - Resting comfortably in bed - Pain controlled - Denies Nausea, Vomiting, Diarrhea, Chest Pain, and SOB  -requires ongoing PT/OT care  Objective   Temp:  [36.5 C (97.7 F)-37.3 C (99.1 F)] 36.5 C (97.7 F) Heart Rate:  [63-76] 63 Respirations:  [15-20] 20 Blood Pressure: (140-155)/(60-73) 140/67  Labs: Recent Labs  Lab 06/29/24 2321 07/02/24 0122  NA 139 138  K 3.9 4.1  CL 107 103  CO2 27 25  BUN 20 22  CREATININE 1.2 1.1  GLU 91 165*   Recent Labs  Lab 06/29/24 2321 07/02/24 0122  WBC 6.7 6.7  HGB 13.9 13.9  PLT 156 202   Recent Labs  Lab 06/27/24 1538 06/29/24 2321  INR 1.4* 1.1*   I/O I/O last 3 completed shifts: In: 1620 [P.O.:890; I.V.:720; IV Piggyback:10] Out: 350 [Urine:300; Blood:50] I/O this shift: In: -  Out: 120 [Urine:120]  Gen: NAD, AOx3 Pulm: Non-labored breathing Cards: Regular rate and rhythm   RLE: Dressings c/d/i SILT sp/dp/t/sa/su Motor +ehl/fhl/ta/gsc Compartments soft, compressible Toes wwp, BCR x5 2+ DP pulse  Assessment   JARMARCUS WAMBOLD is a 50 y.o. male s/p GLF.  Injury/Diagnosis Complex  - R periprosthetic MFC fx  Surgeries - R femur ORIF on 07/01/24 with Dr. Jackquelyn Salines Problem List Active Hospital Problems   Diagnosis Date Noted   DVT (deep venous thrombosis) (CMS-HCC) 06/28/2024   HTN (hypertension), benign 06/28/2024   Acid reflux 06/28/2024   CHF (congestive heart failure) (CMS-HCC) 06/28/2024   Renal disease 06/28/2024   Closed displaced fracture of condyle of right femur (CMS-HCC) 06/27/2024   Trauma 06/27/2024   Plan   General  -IS - Monitor & replete lytes PRN - BR  Acute Pain - Multimodal analgesia  - Adjust PRN  RLE - Splints/Brace/Ex-fix: HKB locked at 20  - Drains/wound vacs: None - Surgery(s) complete - Follow-up: 2-3 weeks - PT/OT  Current Activity Order (From admission, onward)         Ordered    Activity - Weight Bearing and Restrictions  UNTIL DISCONTINUED       Question Answer Comment  Activity Restrictions Weight Bearing   Activity Restrictions Brace Wear Precautions   Weight bearing status RUE Weight bearing as tolerated   Weight bearing status LUE Weight bearing as tolerated   Weight bearing status RLE Non-weight bearing   Weight bearing status LLE Weight bearing as tolerated   Bracing Wear RLE   Patient to wear RLE brace At all times (specify when to remove)   When to remove RLE brace N/A     07/01/24 1411        PPX - Antibiotics: 24 Hr Periop Ancef  - IP: Lovenox  - at Discharge: Complete initial 4 weeks ASA 81mg  BID  Discharge Planning - Estimated DC Date: TBD - Barrier: PT/OT - DC Location: TBD - PT/OT: Pending Eval  Lesley Spurr, MD  [facility]    PRIMARY Ortho Trauma or Joints - PIC [id number]   ALL NEW CONSULTS - PIC [phone]  New Orthopaedic consult patients or newly found  injuries  CURRENT/EXISTING PATIENTS Ortho TRAUMA CONSULTS - PIC [id number]  Ortho SPINE - PIC [phone]  Ortho HAND - PIC [id number]  Podiatry - PIC [phone]  Electronically signed by Rebecka Lesley Leyden, MD at 07/03/2024  6:28 AM EDT

## (undated) NOTE — Unmapped External Note (Signed)
 Formatting of this note is different from the original.  Problem: DVT (Risk For) Goal: Demonstrates adequate circulation status as evidenced by the following indicators: Peripheral pulses strong/Peripheral pulses symmetrical/ Peripheral edema not present Outcome: Continue with Current Goal  Problem: Knowledge Deficit Goal: Patient will verbalize and/or demonstrate understanding of teaching by discharge Outcome: Continue with Current Goal Goal: Patient will Participate in learning opportunities offered by staff Outcome: Continue with Current Goal  Problem: Infection/Isolation (Risk/Actual) Goal: Patient will be free of hospital acquired infection throughout hospitalization Outcome: Continue with Current Goal  Problem: Pain/Comfort Goal: Adequate pain control while hospitalized Outcome: Continue with Current Goal  Problem: Fall Risk Goal: Patient will remain as independent as possible Outcome: Continue with Current Goal Goal: Patient will have lower fall risk. Lower injury risk. Outcome: Continue with Current Goal Goal: Patient will remain safe from falls and injury Outcome: Continue with Current Goal Goal: Patient/family will understand fall prevention measures Outcome: Continue with Current Goal Goal: Patient/family will understand injury reduction measures Outcome: Continue with Current Goal Goal: Patient/family will comply with fall program Outcome: Continue with Current Goal Goal: Patient/family will verbalize fall prevention strategies to implement after discharge Outcome: Continue with Current Goal  Problem: Skin Alteration (Risk for/Actual) Goal: Patient?s skin integrity will remain intact Outcome: Continue with Current Goal Goal: Patient will evidence no skin breakdown Outcome: Continue with Current Goal Goal: Patient will regain integrity of skin tissue Outcome: Continue with Current Goal  Problem: Anxiety Goal: The patient's care is consistent with the  individualized perioperative plan of care. Outcome: Continue with Current Goal Goal: The patient's right to privacy is maintained. Outcome: Continue with Current Goal Goal: Patient and family demonstrates knowledge of expected response to operative or invasive procedures Outcome: Continue with Current Goal  Problem: Anxiety Goal: The patient's care is consistent with the individualized perioperative plan of care. Outcome: Continue with Current Goal Goal: The patient's right to privacy is maintained. Outcome: Continue with Current Goal Goal: Patient and family demonstrates knowledge of expected response to operative or invasive procedures Outcome: Continue with Current Goal  Problem: Acute pain Goal: The patient demonstrates knowledge of pain management. Outcome: Continue with Current Goal  Problem: Risk for falls Goal: Patient is free from signs and symptoms of injuries Outcome: Continue with Current Goal  Problem: Nausea Goal: The patient receives prescribed medications and solutions, safely administered during the post-op period Outcome: Continue with Current Goal  Problem: Post-Operative Respiratory Complications Goal: Patient Will Remain Free Of Post-Operative Complications Outcome: Continue with Current Goal Goal: Effective Utilization Of Incentive Spirometry Description: INTERVENTIONS: 1. 10 times every hour while awake   2. Document volume every 4 hours Outcome: Continue with Current Goal Goal: Effective Pulmonary Toileting Description: INTERVENTIONS: 1. Cough/deep breath every 2 hours while awake Outcome: Continue with Current Goal Goal: Appropriate Oral Care Maintenance Description: INTERVENTIONS:  1. Oral care every 12 hours Outcome: Continue with Current Goal Goal: Adequate Patient/Family Education Description: INTERVENTIONS: 1. Patient/family education provided  Outcome: Continue with Current Goal Goal: Implementation Of Appropriate Early Mobility  Interventions Description: INTERVENTIONS: 1.  (Acute Care/Med-Surg) Get-Up-And-Go assessment completed, and interventions initiated 2.  (Critical/Intermediate Care) M.O.V.E.S. assessment completed, and interventions initiated Outcome: Continue with Current Goal Goal: Maintenance of HOB Elevation Description: INTERVENTIONS: 1. HOB 30 degrees Outcome: Continue with Current Goal  Problem: Anxiety Goal: The patient's care is consistent with the individualized perioperative plan of care. Outcome: Continue with Current Goal Goal: The patient's right to privacy is maintained. Outcome: Continue with Current Goal  Goal: Patient and family demonstrates knowledge of expected response to operative or invasive procedures Outcome: Continue with Current Goal  Problem: Acute pain Goal: The patient demonstrates knowledge of pain management. Outcome: Continue with Current Goal  Problem: Risk for falls Goal: Patient is free from signs and symptoms of injuries Outcome: Continue with Current Goal  Problem: Nausea Goal: The patient receives prescribed medications and solutions, safely administered during the post-op period Outcome: Continue with Current Goal  Problem: Decreased Gait Goal: Ambulate with least restrictive assistive device Outcome: Continue with Current Goal Goal: Improve balance Outcome: Continue with Current Goal  Problem: Decreased ADL Independence Goal: Improve ADL independence Outcome: Continue with Current Goal  Electronically signed by [name] Rife, RN at 07/10/2024  8:50 PM EDT

## (undated) NOTE — Progress Notes (Signed)
 Formatting of this note is different from the original. Initial Care Management Discharge Planning Assessment  Current living environment:  Address: [address] [place] [address]  Has address been verified? Yes   Updated Address (if applicable):    Residential type: Lives with others   Legal next of kin/emergency contact:  Enrique,karen Spouse   [phone]   Payor source (Please contact financial services to verify payor source.):  1. [employer] - [employer]/[facility] [employer] [employer]  NETWORK [employer]-OPTUM  F/O Payor/Plan Precert #  [employer] - [employer]  NETWORK VACCN-OPTUM   Subscriber Subscriber #  Laroy, Mustard [id number]  Address Phone  [address] [place], [place] [address]    2. [facility] MEDICARE ADVANTAGE/BCBS MEDICARE ADVANTAGE  F/O Payor/Plan Precert #  [facility] MEDICARE ADVANTAGE/BCBS MEDICARE ADVANTAGE [id number]  Subscriber Subscriber #  Humbert, Morozov [id number]  Address Phone  [address] [place], [place] [address]     Services prior to admission: None  High risks factors: Comorbidities  Current functional status: Level III: Moderate Assistance. Requires physical assistance with greater than mobility and activities of daily living.    Do you have stairs to enter your home ? no  If so how many ?    Mode of transportation at discharge: Other: Will need transport.   Where will medications be filled at discharge? (insert address/phone number of non-[facility] locations, if known): [facility] *NOTE: If the patient does not know the actual address, a location can be used instead ([facility].e. [facility]  Discharge Planning Guide given? yes emailed [email or web address]  Discharge Planning Guide reviewed with: wife  Concern about returning to current living environment? none  Projected discharge plan: Dc to [facility].   No Pharmacies Listed  Cooper Seeds, CM,RN    Electronically signed  by Seeds Cooper, CM,RN at 06/28/2024  4:58 PM EDT

## (undated) NOTE — Unmapped External Note (Signed)
 Formatting of this note might be different from the original.  Problem: DVT (Risk For) Goal: Demonstrates adequate circulation status as evidenced by the following indicators: Peripheral pulses strong/Peripheral pulses symmetrical/ Peripheral edema not present Outcome: Continue with Current Goal  Problem: Pain/Comfort Goal: Adequate pain control while hospitalized Outcome: Continue with Current Goal  Problem: Fall Risk Goal: Patient will remain as independent as possible Outcome: Continue with Current Goal Goal: Patient will have lower fall risk. Lower injury risk. Outcome: Continue with Current Goal Goal: Patient will remain safe from falls and injury Outcome: Continue with Current Goal Goal: Patient/family will understand fall prevention measures Outcome: Continue with Current Goal Goal: Patient/family will understand injury reduction measures Outcome: Continue with Current Goal Goal: Patient/family will comply with fall program Outcome: Continue with Current Goal Goal: Patient/family will verbalize fall prevention strategies to implement after discharge Outcome: Continue with Current Goal  Electronically signed by Janifer Confer, RN at 07/07/2024  7:58 AM EDT

## (undated) NOTE — Unmapped External Note (Signed)
 Formatting of this note might be different from the original. This patient has one or more incidental findings that must be addressed prior to discharge from their current setting. Please document your action(s) by utilizing the statements below.   Incidental Finding Note  There was an incidental finding noted on a radiology reading.  I have reviewed this report. This incidental finding is clinically significant.  The patient has been informed of the finding and has been advised that follow-up needs to be arranged by them.  Sina  Copeland, PA-C   Electronically signed by Ubaldo Porch, PA-C at 07/13/2024  1:35 AM EDT

## (undated) NOTE — Unmapped External Note (Signed)
 Formatting of this note is different from the original.  Problem: DVT (Risk For) Goal: Demonstrates adequate circulation status as evidenced by the following indicators: Peripheral pulses strong/Peripheral pulses symmetrical/ Peripheral edema not present 06/28/2024 [id number] by Geraline Senters, RN Outcome: Progressing Toward Goal 06/28/2024 0309 by Geraline Senters, RN Outcome: Progressing Toward Goal  Problem: Knowledge Deficit Goal: Patient will verbalize and/or demonstrate understanding of teaching by discharge 06/28/2024 0311 by Geraline Senters, RN Outcome: Progressing Toward Goal 06/28/2024 0309 by Geraline Senters, RN Outcome: Progressing Toward Goal  Problem: Infection/Isolation (Risk/Actual) Goal: Patient will be free of hospital acquired infection throughout hospitalization 06/28/2024 0311 by Geraline Senters, RN Outcome: Progressing Toward Goal 06/28/2024 0309 by Geraline Senters, RN Outcome: Progressing Toward Goal  Problem: Pain/Comfort Goal: Adequate pain control while hospitalized 06/28/2024 0311 by Geraline Senters, RN Outcome: Progressing Toward Goal 06/28/2024 0309 by Geraline Senters, RN Outcome: Progressing Toward Goal  Problem: Fall Risk Goal: Patient will remain as independent as possible 06/28/2024 0311 by Geraline Senters, RN Outcome: Progressing Toward Goal 06/28/2024 0309 by Geraline Senters, RN Outcome: Progressing Toward Goal  Problem: Skin Alteration (Risk for/Actual) Goal: Patient?s skin integrity will remain intact 06/28/2024 0311 by Geraline Senters, RN Outcome: Progressing Toward Goal 06/28/2024 0309 by Geraline Senters, RN Outcome: Progressing Toward Goal  Electronically signed by Geraline Senters, RN at 06/28/2024  3:12 AM EDT

## (undated) NOTE — Unmapped External Note (Signed)
 Formatting of this note is different from the original.  Problem: DVT (Risk For) Goal: Demonstrates adequate circulation status as evidenced by the following indicators: Peripheral pulses strong/Peripheral pulses symmetrical/ Peripheral edema not present Outcome: Continue with Current Goal  Problem: Knowledge Deficit Goal: Patient will verbalize and/or demonstrate understanding of teaching by discharge Outcome: Continue with Current Goal Goal: Patient will Participate in learning opportunities offered by staff Outcome: Continue with Current Goal  Problem: Infection/Isolation (Risk/Actual) Goal: Patient will be free of hospital acquired infection throughout hospitalization Outcome: Continue with Current Goal  Problem: Pain/Comfort Goal: Adequate pain control while hospitalized Outcome: Continue with Current Goal  Problem: Fall Risk Goal: Patient will remain as independent as possible Outcome: Continue with Current Goal Goal: Patient will have lower fall risk. Lower injury risk. Outcome: Continue with Current Goal Goal: Patient will remain safe from falls and injury Outcome: Continue with Current Goal Goal: Patient/family will understand fall prevention measures Outcome: Continue with Current Goal Goal: Patient/family will understand injury reduction measures Outcome: Continue with Current Goal Goal: Patient/family will comply with fall program Outcome: Continue with Current Goal Goal: Patient/family will verbalize fall prevention strategies to implement after discharge Outcome: Continue with Current Goal  Problem: Skin Alteration (Risk for/Actual) Goal: Patient?s skin integrity will remain intact Outcome: Continue with Current Goal Goal: Patient will evidence no skin breakdown Outcome: Continue with Current Goal Goal: Patient will regain integrity of skin tissue Outcome: Continue with Current Goal  Problem: Anxiety Goal: The patient's care is consistent with the  individualized perioperative plan of care. Outcome: Continue with Current Goal Goal: The patient's right to privacy is maintained. Outcome: Continue with Current Goal Goal: Patient and family demonstrates knowledge of expected response to operative or invasive procedures Outcome: Continue with Current Goal  Problem: Anxiety Goal: The patient's care is consistent with the individualized perioperative plan of care. Outcome: Continue with Current Goal Goal: The patient's right to privacy is maintained. Outcome: Continue with Current Goal Goal: Patient and family demonstrates knowledge of expected response to operative or invasive procedures Outcome: Continue with Current Goal  Problem: Acute pain Goal: The patient demonstrates knowledge of pain management. Outcome: Continue with Current Goal  Problem: Risk for falls Goal: Patient is free from signs and symptoms of injuries Outcome: Continue with Current Goal  Problem: Nausea Goal: The patient receives prescribed medications and solutions, safely administered during the post-op period Outcome: Continue with Current Goal  Problem: Post-Operative Respiratory Complications Goal: Patient Will Remain Free Of Post-Operative Complications Outcome: Continue with Current Goal Goal: Effective Utilization Of Incentive Spirometry Description: INTERVENTIONS: 1. 10 times every hour while awake   2. Document volume every 4 hours Outcome: Continue with Current Goal Goal: Effective Pulmonary Toileting Description: INTERVENTIONS: 1. Cough/deep breath every 2 hours while awake Outcome: Continue with Current Goal Goal: Appropriate Oral Care Maintenance Description: INTERVENTIONS:  1. Oral care every 12 hours Outcome: Continue with Current Goal Goal: Adequate Patient/Family Education Description: INTERVENTIONS: 1. Patient/family education provided  Outcome: Continue with Current Goal Goal: Implementation Of Appropriate Early Mobility  Interventions Description: INTERVENTIONS: 1.  (Acute Care/Med-Surg) Get-Up-And-Go assessment completed, and interventions initiated 2.  (Critical/Intermediate Care) M.O.V.E.S. assessment completed, and interventions initiated Outcome: Continue with Current Goal Goal: Maintenance of HOB Elevation Description: INTERVENTIONS: 1. HOB 30 degrees Outcome: Continue with Current Goal  Problem: Anxiety Goal: The patient's care is consistent with the individualized perioperative plan of care. Outcome: Continue with Current Goal Goal: The patient's right to privacy is maintained. Outcome: Continue with Current Goal  Goal: Patient and family demonstrates knowledge of expected response to operative or invasive procedures Outcome: Continue with Current Goal  Problem: Acute pain Goal: The patient demonstrates knowledge of pain management. Outcome: Continue with Current Goal  Problem: Risk for falls Goal: Patient is free from signs and symptoms of injuries Outcome: Continue with Current Goal  Problem: Nausea Goal: The patient receives prescribed medications and solutions, safely administered during the post-op period Outcome: Continue with Current Goal  Problem: Decreased Gait Goal: Ambulate with least restrictive assistive device Outcome: Continue with Current Goal Goal: Improve balance Outcome: Continue with Current Goal  Electronically signed by Mannie Rosella, RN at 07/05/2024  7:46 PM EDT

## (undated) NOTE — Unmapped External Note (Signed)
 Formatting of this note is different from the original.  Problem: DVT (Risk For) Goal: Demonstrates adequate circulation status as evidenced by the following indicators: Peripheral pulses strong/Peripheral pulses symmetrical/ Peripheral edema not present Outcome: Continue with Current Goal  Problem: Knowledge Deficit Goal: Patient will verbalize and/or demonstrate understanding of teaching by discharge Outcome: Continue with Current Goal Goal: Patient will Participate in learning opportunities offered by staff Outcome: Continue with Current Goal  Problem: Infection/Isolation (Risk/Actual) Goal: Patient will be free of hospital acquired infection throughout hospitalization Outcome: Continue with Current Goal  Problem: Pain/Comfort Goal: Adequate pain control while hospitalized Outcome: Continue with Current Goal  Problem: Fall Risk Goal: Patient will remain as independent as possible Outcome: Continue with Current Goal Goal: Patient will have lower fall risk. Lower injury risk. Outcome: Continue with Current Goal Goal: Patient will remain safe from falls and injury Outcome: Continue with Current Goal Goal: Patient/family will understand fall prevention measures Outcome: Continue with Current Goal Goal: Patient/family will understand injury reduction measures Outcome: Continue with Current Goal Goal: Patient/family will comply with fall program Outcome: Continue with Current Goal Goal: Patient/family will verbalize fall prevention strategies to implement after discharge Outcome: Continue with Current Goal  Problem: Skin Alteration (Risk for/Actual) Goal: Patient?s skin integrity will remain intact Outcome: Continue with Current Goal Goal: Patient will evidence no skin breakdown Outcome: Continue with Current Goal Goal: Patient will regain integrity of skin tissue Outcome: Continue with Current Goal  Problem: Anxiety Goal: The patient's care is consistent with the  individualized perioperative plan of care. Outcome: Continue with Current Goal Goal: The patient's right to privacy is maintained. Outcome: Continue with Current Goal Goal: Patient and family demonstrates knowledge of expected response to operative or invasive procedures Outcome: Continue with Current Goal  Problem: Anxiety Goal: The patient's care is consistent with the individualized perioperative plan of care. Outcome: Continue with Current Goal Goal: The patient's right to privacy is maintained. Outcome: Continue with Current Goal Goal: Patient and family demonstrates knowledge of expected response to operative or invasive procedures Outcome: Continue with Current Goal  Problem: Acute pain Goal: The patient demonstrates knowledge of pain management. Outcome: Continue with Current Goal  Problem: Risk for falls Goal: Patient is free from signs and symptoms of injuries Outcome: Continue with Current Goal  Problem: Nausea Goal: The patient receives prescribed medications and solutions, safely administered during the post-op period Outcome: Continue with Current Goal  Problem: Post-Operative Respiratory Complications Goal: Patient Will Remain Free Of Post-Operative Complications Outcome: Continue with Current Goal Goal: Effective Utilization Of Incentive Spirometry Description: INTERVENTIONS: 1. 10 times every hour while awake   2. Document volume every 4 hours Outcome: Continue with Current Goal Goal: Effective Pulmonary Toileting Description: INTERVENTIONS: 1. Cough/deep breath every 2 hours while awake Outcome: Continue with Current Goal Goal: Appropriate Oral Care Maintenance Description: INTERVENTIONS:  1. Oral care every 12 hours Outcome: Continue with Current Goal Goal: Adequate Patient/Family Education Description: INTERVENTIONS: 1. Patient/family education provided  Outcome: Continue with Current Goal Goal: Implementation Of Appropriate Early Mobility  Interventions Description: INTERVENTIONS: 1.  (Acute Care/Med-Surg) Get-Up-And-Go assessment completed, and interventions initiated 2.  (Critical/Intermediate Care) M.O.V.E.S. assessment completed, and interventions initiated Outcome: Continue with Current Goal Goal: Maintenance of HOB Elevation Description: INTERVENTIONS: 1. HOB 30 degrees Outcome: Continue with Current Goal  Problem: Anxiety Goal: The patient's care is consistent with the individualized perioperative plan of care. Outcome: Continue with Current Goal Goal: The patient's right to privacy is maintained. Outcome: Continue with Current Goal  Goal: Patient and family demonstrates knowledge of expected response to operative or invasive procedures Outcome: Continue with Current Goal  Problem: Acute pain Goal: The patient demonstrates knowledge of pain management. Outcome: Continue with Current Goal  Problem: Risk for falls Goal: Patient is free from signs and symptoms of injuries Outcome: Continue with Current Goal  Problem: Nausea Goal: The patient receives prescribed medications and solutions, safely administered during the post-op period Outcome: Continue with Current Goal  Problem: Decreased Gait Goal: Ambulate with least restrictive assistive device Outcome: Continue with Current Goal Goal: Improve balance Outcome: Continue with Current Goal  Problem: Decreased ADL Independence Goal: Improve ADL independence Outcome: Continue with Current Goal  Electronically signed by [name] Rife, RN at 07/07/2024  7:44 PM EDT

---

## 1968-12-30 DIAGNOSIS — Z85830 Personal history of malignant neoplasm of bone: Secondary | ICD-10-CM

## 1968-12-30 HISTORY — PX: OTHER SURGICAL HISTORY: SHX169

## 1968-12-30 HISTORY — PX: FEMORAL OSTEOTOMY W/ RODDING: SHX1588

## 1968-12-30 HISTORY — DX: Personal history of malignant neoplasm of bone: Z85.830

## 2000-01-17 ENCOUNTER — Other Ambulatory Visit: Admission: RE | Admit: 2000-01-17 | Discharge: 2000-01-17 | Payer: Self-pay | Admitting: Urology

## 2000-08-05 ENCOUNTER — Encounter (INDEPENDENT_AMBULATORY_CARE_PROVIDER_SITE_OTHER): Payer: Self-pay | Admitting: Specialist

## 2000-08-05 ENCOUNTER — Other Ambulatory Visit: Admission: RE | Admit: 2000-08-05 | Discharge: 2000-08-05 | Payer: Self-pay | Admitting: Urology

## 2001-12-30 DIAGNOSIS — Z8546 Personal history of malignant neoplasm of prostate: Secondary | ICD-10-CM

## 2001-12-30 HISTORY — DX: Personal history of malignant neoplasm of prostate: Z85.46

## 2002-02-16 ENCOUNTER — Encounter: Payer: Self-pay | Admitting: Urology

## 2002-02-16 ENCOUNTER — Ambulatory Visit (HOSPITAL_COMMUNITY): Admission: RE | Admit: 2002-02-16 | Discharge: 2002-02-16 | Payer: Self-pay | Admitting: Urology

## 2002-03-03 ENCOUNTER — Encounter: Payer: Self-pay | Admitting: Urology

## 2002-03-10 ENCOUNTER — Encounter (INDEPENDENT_AMBULATORY_CARE_PROVIDER_SITE_OTHER): Payer: Self-pay | Admitting: Specialist

## 2002-03-10 ENCOUNTER — Inpatient Hospital Stay (HOSPITAL_COMMUNITY): Admission: RE | Admit: 2002-03-10 | Discharge: 2002-03-15 | Payer: Self-pay | Admitting: Urology

## 2002-03-10 HISTORY — PX: PROSTATECTOMY: SHX69

## 2005-10-17 ENCOUNTER — Encounter: Admission: RE | Admit: 2005-10-17 | Discharge: 2005-10-17 | Payer: Self-pay | Admitting: Family Medicine

## 2005-10-17 ENCOUNTER — Ambulatory Visit: Payer: Self-pay | Admitting: Family Medicine

## 2005-10-22 ENCOUNTER — Ambulatory Visit: Payer: Self-pay | Admitting: Family Medicine

## 2005-10-28 ENCOUNTER — Encounter: Admission: RE | Admit: 2005-10-28 | Discharge: 2005-10-28 | Payer: Self-pay | Admitting: Family Medicine

## 2005-12-16 ENCOUNTER — Encounter: Admission: RE | Admit: 2005-12-16 | Discharge: 2006-03-16 | Payer: Self-pay | Admitting: Neurosurgery

## 2007-03-06 ENCOUNTER — Encounter: Admission: RE | Admit: 2007-03-06 | Discharge: 2007-03-06 | Payer: Self-pay | Admitting: Family Medicine

## 2007-03-06 ENCOUNTER — Ambulatory Visit: Payer: Self-pay | Admitting: Family Medicine

## 2007-03-17 ENCOUNTER — Encounter: Admission: RE | Admit: 2007-03-17 | Discharge: 2007-03-17 | Payer: Self-pay | Admitting: Neurosurgery

## 2007-05-08 ENCOUNTER — Ambulatory Visit: Payer: Self-pay | Admitting: Internal Medicine

## 2007-07-22 LAB — PSA: PSA: 0.01

## 2007-10-29 DIAGNOSIS — I10 Essential (primary) hypertension: Secondary | ICD-10-CM | POA: Insufficient documentation

## 2007-10-29 HISTORY — DX: Essential (primary) hypertension: I10

## 2007-12-31 DIAGNOSIS — Z8711 Personal history of peptic ulcer disease: Secondary | ICD-10-CM

## 2007-12-31 HISTORY — DX: Personal history of peptic ulcer disease: Z87.11

## 2008-04-21 ENCOUNTER — Ambulatory Visit: Payer: Self-pay | Admitting: Family Medicine

## 2008-04-21 DIAGNOSIS — M545 Low back pain, unspecified: Secondary | ICD-10-CM | POA: Insufficient documentation

## 2008-04-21 DIAGNOSIS — Z87442 Personal history of urinary calculi: Secondary | ICD-10-CM | POA: Insufficient documentation

## 2008-04-21 DIAGNOSIS — M549 Dorsalgia, unspecified: Secondary | ICD-10-CM | POA: Insufficient documentation

## 2008-04-21 DIAGNOSIS — Z8711 Personal history of peptic ulcer disease: Secondary | ICD-10-CM | POA: Insufficient documentation

## 2008-04-21 HISTORY — DX: Personal history of peptic ulcer disease: Z87.11

## 2008-04-21 HISTORY — DX: Personal history of urinary calculi: Z87.442

## 2008-04-21 HISTORY — DX: Low back pain, unspecified: M54.50

## 2008-05-05 ENCOUNTER — Ambulatory Visit: Payer: Self-pay | Admitting: Family Medicine

## 2008-06-05 ENCOUNTER — Emergency Department (HOSPITAL_COMMUNITY): Admission: EM | Admit: 2008-06-05 | Discharge: 2008-06-06 | Payer: Self-pay | Admitting: Emergency Medicine

## 2009-04-07 DIAGNOSIS — J45909 Unspecified asthma, uncomplicated: Secondary | ICD-10-CM

## 2009-04-07 HISTORY — DX: Unspecified asthma, uncomplicated: J45.909

## 2009-04-10 ENCOUNTER — Ambulatory Visit: Payer: Self-pay | Admitting: Family Medicine

## 2009-06-06 ENCOUNTER — Ambulatory Visit: Payer: Self-pay | Admitting: Family Medicine

## 2009-06-09 ENCOUNTER — Ambulatory Visit: Payer: Self-pay | Admitting: Family Medicine

## 2009-12-22 ENCOUNTER — Emergency Department (HOSPITAL_COMMUNITY): Admission: EM | Admit: 2009-12-22 | Discharge: 2009-12-22 | Payer: Self-pay | Admitting: Emergency Medicine

## 2010-02-26 ENCOUNTER — Ambulatory Visit: Payer: Self-pay | Admitting: Family Medicine

## 2010-02-26 DIAGNOSIS — R319 Hematuria, unspecified: Secondary | ICD-10-CM | POA: Insufficient documentation

## 2010-02-26 LAB — CONVERTED CEMR LAB
Bilirubin Urine: NEGATIVE
Glucose, Urine, Semiquant: NEGATIVE
Ketones, urine, test strip: NEGATIVE
Nitrite: NEGATIVE
Specific Gravity, Urine: >=1.03
Urobilinogen, UA: 0.2
WBC Urine, dipstick: NEGATIVE
pH: 5

## 2010-03-05 ENCOUNTER — Emergency Department (HOSPITAL_COMMUNITY): Admission: EM | Admit: 2010-03-05 | Discharge: 2010-03-06 | Payer: Self-pay | Admitting: Emergency Medicine

## 2010-08-13 ENCOUNTER — Ambulatory Visit: Payer: Self-pay | Admitting: Family Medicine

## 2010-08-13 DIAGNOSIS — I6782 Cerebral ischemia: Secondary | ICD-10-CM | POA: Insufficient documentation

## 2010-08-13 DIAGNOSIS — I959 Hypotension, unspecified: Secondary | ICD-10-CM

## 2010-08-13 HISTORY — DX: Hypotension, unspecified: I95.9

## 2010-08-15 ENCOUNTER — Emergency Department (HOSPITAL_COMMUNITY): Admission: EM | Admit: 2010-08-15 | Discharge: 2010-08-16 | Payer: Self-pay | Admitting: Emergency Medicine

## 2010-08-16 ENCOUNTER — Telehealth: Payer: Self-pay | Admitting: Family Medicine

## 2010-08-17 ENCOUNTER — Ambulatory Visit: Payer: Self-pay | Admitting: Family Medicine

## 2010-08-21 ENCOUNTER — Ambulatory Visit: Payer: Self-pay | Admitting: Family Medicine

## 2010-09-18 ENCOUNTER — Telehealth: Payer: Self-pay | Admitting: Family Medicine

## 2011-01-29 NOTE — Assessment & Plan Note (Signed)
Summary: fever,head and chest congestion/jls   Vital Signs:  Patient profile:   65 year old male Height:      72 inches Weight:      236 pounds BMI:     32.12 BP sitting:   120 / 80  (right arm) Cuff size:   large  Vitals Entered By: Kern Reap CMA (April 10, 2009 1:29 PM)  Reason for Visit chest congestion  History of Present Illness: Bruce Ball is a 65 year old male, nonsmoker, with a history of sprain allergic rhinitis.  He comes in today for a 4-day history wheezing.  Because of the high pollen count instead of his normal sneezing, runny, nose he's now developed asthma.  Review of systems negative  patient has had a prostatectomy  Allergies (verified): No Known Drug Allergies  Past History:  Past medical history reviewed for relevance to current acute and chronic problems. Social history (including risk factors) reviewed for relevance to current acute and chronic problems.  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 No apparent alopecia or balding. Eyes:  No corneal or conjunctival inflammation noted. EOMI. Perrla. Funduscopic exam benign, without hemorrhages, exudates or papilledema. Vision grossly normal. Ears:  External ear exam shows no significant lesions or deformities.  Otoscopic examination reveals clear canals, tympanic membranes are intact bilaterally without bulging, retraction, inflammation or discharge. Hearing is grossly  normal bilaterally. Nose:  External nasal examination shows no deformity or inflammation. Nasal mucosa are pink and moist without lesions or exudates. Mouth:  Oral mucosa and oropharynx without lesions or exudates.  Teeth in good repair. Neck:  No deformities, masses, or tenderness noted. Chest Wall:  No deformities, masses, tenderness or gynecomastia noted. Lungs:  symmetrical breath sounds bilateral wheezing   Impression & Recommendations:  Problem # 1:  EXTRINSIC ASTHMA, UNSPECIFIED (ICD-493.00) Assessment New  Orders: Prescription Created Electronically [phone])  His updated medication list for this problem includes:    Prednisone 20 Mg Tabs (Prednisone) ..... Uad    Proventil Hfa 108 (90 Base) Mcg/act Aers (Albuterol sulfate) .Marland Kitchen... 2 ps three times a day as needed  Complete Medication List: 1)  Diltiazem Hcl Er Beads 360 Mg Cp24 (Diltiazem hcl er beads) .... Take 1 capsule by mouth once a day 2)  Clonidine Hcl 0.1 Mg Tabs (Clonidine hcl) .... Take 1 tablet by mouth two times a day 3)  Omeprazole 20 Mg Cpdr (Omeprazole) .Marland Kitchen.. 1 by mouth once daily 4)  Hydrocodone-acetaminophen 7.5-750 Mg Tabs (Hydrocodone-acetaminophen) .... As needed 5)  Trandate 200 Mg Tabs (Labetalol hcl) .... Take 1 tablet by mouth every morning 6)  Cyanocobalamin 1000 Mcg/ml Inj Soln (Cyanocobalamin) .... Inject once a month 7)  Prednisone 20 Mg Tabs (Prednisone) .... Uad 8)  Proventil Hfa 108 (90 Base) Mcg/act Aers (Albuterol sulfate) .... 2 ps three times a day as needed  Patient  Instructions: 1)  begin prednisone, I. taking two tablets now then two tablets every morning starting tomorrow morning for 3 days, one for 3 days, half a tablet a day for 3 days, then half a tablet Monday, Wednesday, Friday, for two week taper. 2)  When he finished the prednisone, you can take Claritin-D or Zyrtec D4 seasonal symptoms.  Or if the D. preparation causes urinary tract obstruction just take plain Claritin or plain  Zyrtec. 3)  Also, he may take albuterol two puffs up to 3 times a day for the next for 5 days and to the prednisone begins to work. Prescriptions: PROVENTIL HFA 108 (90 BASE) MCG/ACT AERS (ALBUTEROL SULFATE) 2 ps three times a day as needed  #1 x 1   Entered and Authorized by:   Roderick Pee MD   Signed by:   Roderick Pee MD on 04/10/2009   Method used:   Electronically to        [id number] (retail)       [address]       [place], [place]  [address]       Ph: [phone]       Fax: [phone]   RxID:   [id number] PREDNISONE 20 MG TABS (PREDNISONE) UAD  #50 x 0   Entered and Authorized by:   Roderick Pee MD   Signed by:   Roderick Pee MD on 04/10/2009   Method used:   Electronically to        [facility] #[id number]* (retail)       [address]       [place], [place]  [address]       Ph: [phone]       Fax: [phone]   RxID:   [id number]

## 2011-01-29 NOTE — Assessment & Plan Note (Signed)
Summary: 2 day fup//ccm   History of Present Illness: Bruce Ball is a 65 year old male, who comes in today for evaluation of hypertension.  As noted previously.  His medication had been increased at the [place] and he developed significant hypotension and can not stand up .  His doses were withheld.  However, unfortunately, after 72 hours.  His pressure went sky high.  He ended up in the emergency room medication was given and now is back for follow-up.  A week ago.  We started him on ccb  90 mg once daily with the clonidine p.r.n. if his blood pressure over160 systolic.  BPs now range from 150 to 160 systolic to 80 to 90 diastolic  Allergies: No Known Drug Allergies  Past History:  Past medical, surgical, family and social histories (including risk factors) reviewed for relevance to current acute and chronic problems.  Past Medical History: Reviewed history from 04/21/2008 and no changes required. Hypertension Nephrolithiasis, hx of Peptic ulcer Low back pain  Past Surgical History: Reviewed history from 04/21/2008 and no changes required. Bone cancer rt femur removed & steel rod placed Prostatectomy  Family History: Reviewed history from 04/21/2008 and no changes required. Obstructive pulmonary disease Dad Family History of Prostate CA 1st degree relative <50  Social History: Reviewed history from 04/21/2008 and no changes required. 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 Take 1 tablet by mouth every morning  Complete Medication List: 1)  Omeprazole 20 Mg Cpdr (Omeprazole) .Marland Kitchen.. 1 by mouth once daily 2)  Cyanocobalamin 1000 Mcg/ml Inj Soln  (Cyanocobalamin) .... Inject once a month 3)  Acetaminophen-codeine #2 300-15 Mg Tabs (Acetaminophen-codeine) .... Take one tab three times a day as needed 4)  Hydrocodone-acetaminophen 5-500 Mg Tabs (Hydrocodone-acetaminophen) .... Take one tab by mouth two times a day as needed 5)  Acetaminophen-codeine #3 300-30 Mg Tabs (Acetaminophen-codeine) .... Take one tab by mouth three times a day as needed 6)  Diltiazem Hcl 90 Mg Tabs (Diltiazem hcl) .... Take 1 tablet by mouth every morning  Patient Instructions: 1)  increase the diltiazem to 180 mg daily. 2)  Check your blood pressure daily.  Faxed me the data in 3 weeks at [phone]........... I will call you  to discuss how we proceed

## 2011-01-29 NOTE — Assessment & Plan Note (Signed)
Summary: dizziness appt 3.30p/njr   Vital Signs:  Patient profile:   65 year old male Weight:      241 pounds Temp:     98.5 degrees F oral BP sitting:   130 / 84  (left arm) Cuff size:   large  Vitals Entered By: Kern Reap CMA (June 06, 2009 4:04 PM)  Reason for Visit lightheaded  History of Present Illness: Gentle is a 65 year old male, who comes in today accompanied by his wife for evaluation of two problems, well 3.  He has underlying hypertension, treated with Denyse Amass, 6.25 mg b.i.d., but ties and 360 daily, and clonidine, .2 b.i.d.  BP today 140 over hundred.  He said home.  His blood pressure spikes up to 177/94 in the evening with a heart rate of 95.  He feels tired, fatigued, no energy, and has headaches, and he thinks it may be possibly one of his blood pressure medications.  He also goes to the [facility]  Allergies (verified): No Known Drug Allergies  Past History:  Past medical, surgical, family and social histories (including risk factors) reviewed, and no changes noted (except as noted below).  Past Medical History: Reviewed history from 04/21/2008 and no changes required. Hypertension Nephrolithiasis, hx of Peptic ulcer Low back pain  Past Surgical History: Reviewed history from 04/21/2008 and no changes required. Bone cancer rt femur removed & steel rod placed Prostatectomy  Family History: Reviewed history from 04/21/2008 and no changes required. Obstructive pulmonary disease Dad Family History of Prostate CA 1st degree relative <50  Social History: Reviewed history from 04/21/2008 and no changes required. 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 Take 1 tablet by mouth two times a day    Trandate 200 Mg Tabs (Labetalol hcl) .Marland Kitchen... Take 1 tablet by mouth every morning His updated medication list for this problem includes:    Diltiazem Hcl Er Beads 360 Mg Cp24 (Diltiazem hcl er beads) .Marland Kitchen... Take 1 capsule by mouth once a day    Clonidine Hcl 0.2 Mg Tabs (Clonidine hcl) .Marland Kitchen... Take one tab two times a day    Cardura 4 Mg Tabs (Doxazosin mesylate) .Marland Kitchen... 1 tab @ bedtime for htn  Orders: Prescription Created Electronically [phone])  Complete Medication List: 1)  Diltiazem Hcl Er Beads 360 Mg Cp24 (Diltiazem hcl er beads) .... Take 1 capsule by mouth once a day 2)  Omeprazole 20 Mg Cpdr (Omeprazole) .Marland Kitchen.. 1 by mouth once daily 3)  Cyanocobalamin 1000 Mcg/ml Inj Soln (Cyanocobalamin) .... Inject once a month 4)  Clonidine Hcl 0.2 Mg Tabs (Clonidine hcl) .... Take one tab two times a day 5)  Acetaminophen-codeine #2 300-15 Mg Tabs (Acetaminophen-codeine) .... Take one tab three times a day as needed 6)  Cyclobenzaprine Hcl 10 Mg Tabs (Cyclobenzaprine hcl) .... Take one tab at bedtime 7)  Vitamin D 1000 Unit Tabs (Cholecalciferol) .... Take one tab two times a day 8)  Cardura 4 Mg Tabs (Doxazosin mesylate) .Marland Kitchen.. 1 tab @ bedtime for htn  Patient Instructions: 1)  stop the carvedilol, and began Cardura 4 mg at bedtime check  a blood pressure morning, noon, and evening, return Friday for follow-up Prescriptions: CARDURA 4 MG TABS (DOXAZOSIN MESYLATE) 1 tab @ bedtime for HTN  #30 x 1   Entered and Authorized by:   Roderick Pee MD   Signed by:   Roderick Pee MD on 06/06/2009   Method used:   Electronically to        [id number]* (retail)       [address]       [place], [place]  [address]       Ph: [phone]       Fax: [phone]   RxID:    [id number]

## 2011-01-29 NOTE — Assessment & Plan Note (Signed)
Summary: fup per dr todd//ccm   Vital Signs:  Patient profile:   65 year old male Weight:      235 pounds Temp:     98.1 degrees F oral BP sitting:   148 / 98  (right arm) Cuff size:   regular  Vitals Entered By: Kern Reap CMA Duncan Dull) (August 17, 2010 3:19 PM) CC: follow-up visit   CC:  follow-up visit.  History of Present Illness: Bruce Ball is a 65 year old male, who comes in today for reevaluation of hypertension.  We saw him last week with marked hypotension systolic 100.  He was unable to sit up.  We therefore held and his diltiazem clonidine and beta blocker.  Unfortunately, his blood pressure shot up to 230 systolic, and he went to the emergency room.  In the emergency room numerous diagnostic studies including a CT brain scan were normal.  He is currently taking clonidine .2 b.i.d. BP 140/90.  However, the clonidine makes him feel so bad he wants to discuss trying other options.  Allergies: No Known Drug Allergies  Past History:  Past medical, surgical, family and social histories (including risk factors) reviewed for relevance to current acute and chronic problems.  Past Medical History: Reviewed history from 04/21/2008 and no changes required. Hypertension Nephrolithiasis, hx of Peptic ulcer Low back pain  Past Surgical History: Reviewed history from 04/21/2008 and no changes required. Bone cancer rt femur removed & steel rod placed Prostatectomy  Family History: Reviewed history from 04/21/2008 and no changes required. Obstructive pulmonary disease Dad Family History of Prostate CA 1st degree relative <50  Social History: Reviewed history from 04/21/2008 and no changes required. 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 Take 1 capsule by mouth once a day    Clonidine Hcl 0.2 Mg Tabs (Clonidine hcl) .Marland Kitchen... Take one tab two times a day    Carvedilol 25 Mg Tabs (Carvedilol) .Marland Kitchen... Take one tab by mouth two times a day His updated medication list for this problem includes:    Diltiazem Hcl 90 Mg Tabs (Diltiazem hcl) .Marland Kitchen... Take 1 tablet by mouth every morning  Complete Medication List: 1)  Omeprazole 20 Mg Cpdr (Omeprazole) .Marland Kitchen.. 1 by mouth once daily 2)  Cyanocobalamin 1000 Mcg/ml Inj Soln (Cyanocobalamin) .... Inject once a month 3)  Acetaminophen-codeine #2 300-15 Mg Tabs (Acetaminophen-codeine) .... Take one tab three times a day as needed 4)  Hydrocodone-acetaminophen 5-500 Mg Tabs (Hydrocodone-acetaminophen) .... Take one tab by mouth two times a day as needed 5)  Acetaminophen-codeine #3 300-30 Mg Tabs (Acetaminophen-codeine) .... Take one tab by mouth three times a day as needed 6)  Diltiazem Hcl 90 Mg Tabs (Diltiazem hcl) .... Take 1 tablet by mouth every morning  Patient Instructions: 1)  tomorrow morning, start 90 mg d niacin daily in the morning.  Hold the clonidine, unless she is systolic blood pressure is above 170.  If it does go above 170 take the clonidine immediately and lie down for an hour.  Check your blood pressure 3 times a day.  Return Tuesday for follow-up.  Also remembered his  stay and a complete salt free diet Prescriptions: DILTIAZEM HCL 90 MG TABS (DILTIAZEM HCL) Take 1 tablet by mouth every morning  #100 x 3   Entered and Authorized by:   Roderick Pee MD   Signed by:   Roderick Pee MD on 08/17/2010   Method used:   Print then Give to Patient   RxID:   [id number]

## 2011-01-29 NOTE — Progress Notes (Signed)
  Phone Note Outgoing Call   Summary of Call:  Talis, fax me his blood pressure readings over the last two weeks on diltiazem 180 mg daily and clonidine .2 b.i.d. BP 160 to 170 systolic diastolics are in the 80s to 90s range.  Recommend he increase the diltiazem to 360 daily continue the clonidine check a morning blood pressure daily.  Fax me the date in two weeks or call if his blood pressure drops too low like it did before Initial call taken by: Roderick Pee MD,  September 18, 2010 1:55 PM

## 2011-01-29 NOTE — Assessment & Plan Note (Signed)
Summary: BLOOD IN URINE/PAIN/CJR   Vital Signs:  Patient profile:   65 year old male Weight:      236 pounds Temp:     99.1 degrees F oral Pulse rate:   56 / minute Pulse rhythm:   regular BP sitting:   130 / 82  (left arm) Cuff size:   regular  Vitals Entered By: Raechel Ache, RN (February 26, 2010 12:23 PM) CC: C/o dark urine x 10 days, feels bad, flank pain, nausea.   CC:  C/o dark urine x 10 days, feels bad, flank pain, and nausea..  History of Present Illness: geo. is a 65 y/o male NS no ins [facility]land Kitchen since today with a 10 day history of discolored urine.  He's had a history of recurrent kidney stones.  His last stone was 2008.  It passed spontaneously.  He did have to have one stone removed in the the basket 20 years ago by Dr. Aldean Ast.  He's had no fever, chills, nausea, vomiting, or diarrhea.  He also has a history of severe hip dysplasia.  [facility]  Wants to do bilateral knee replacements at the same time.  I recommended a second opinion with our folks here in town.    Allergies: No Known Drug Allergies  Past History:  Past medical, surgical, family and social histories (including risk factors) reviewed for relevance to current acute and chronic problems.  Past Medical History: Reviewed history from 04/21/2008 and no changes required. Hypertension Nephrolithiasis, hx of Peptic ulcer Low back pain  Past Surgical History: Reviewed history from 04/21/2008 and no changes required. Bone cancer rt femur removed & steel rod placed Prostatectomy  Family History: Reviewed history from 04/21/2008 and no changes required. Obstructive pulmonary disease Dad Family History of Prostate CA 1st degree relative <50  Social History: Reviewed history from 04/21/2008 and no changes required. 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 Take 1 capsule by mouth once a day 2)  Omeprazole 20 Mg Cpdr (Omeprazole) .Marland Kitchen.. 1 by mouth once daily 3)  Cyanocobalamin 1000 Mcg/ml Inj Soln (Cyanocobalamin) .... Inject once a month 4)  Clonidine Hcl 0.2 Mg Tabs (Clonidine hcl) .... Take one tab two times a day 5)  Acetaminophen-codeine #2 300-15 Mg Tabs (Acetaminophen-codeine) .... Take one tab three times a day as needed 6)  Cyclobenzaprine Hcl 10 Mg Tabs (Cyclobenzaprine hcl) .... Take one tab at bedtime 7)  Vitamin D 1000 Unit Tabs (Cholecalciferol) .... Take one tab two times a day 8)  Cardura 4 Mg Tabs (Doxazosin mesylate) .Marland Kitchen.. 1 tab @ bedtime for htn  Patient Instructions: 1)  I would recommend that you go to the [facility] today for further evaluation  Laboratory Results   Urine Tests    Routine Urinalysis   Color: bronze Appearance: Clear Glucose: negative   (Normal Range: Negative) Bilirubin: negative   (Normal Range: Negative) Ketone: negative   (Normal Range: Negative) Spec. Gravity: >=1.030   (Normal Range: 1.003-1.035) Blood: large   (Normal Range: Negative) pH: 5.0   (Normal Range: 5.0-8.0) Protein: trace   (Normal Range: Negative) Urobilinogen:  0.2   (Normal Range: 0-1) Nitrite: negative   (Normal Range: Negative) Leukocyte Esterace: negative   (Normal Range: Negative)

## 2011-01-29 NOTE — Assessment & Plan Note (Signed)
Summary: 3 day rov/njr   Vital Signs:  Patient profile:   65 year old male Weight:      241 pounds Temp:     98.3 degrees F oral BP sitting:   154 / 88  (left arm) Cuff size:   large  Vitals Entered By: Kern Reap CMA (June 09, 2009 4:16 PM)  Reason for Visit follow up bp  History of Present Illness: Bruce Ball is a 65 year old male comes in today accompanied by his wife for evaluation of hypertension.  We started him on Cardura 4 mg nightly because his blood pressure was done under control with his calcium channel blocker.  Blood pressures dropped 150/90, but not normal yet.  He states he is due to go to the [facility] for follow-up in a week.  He requests a follow-up on his blood pressure.  I explained to him he would need frequent monitoring every two to 3 weeks.  He says the [facility] can only see him every 6 months.  I asked him to come back and see [name] after he spent seen by the [facility].  Allergies (verified): No Known Drug Allergies  Past History:  Past medical, surgical, family and social histories (including risk factors) reviewed, and no changes noted (except as noted below).  Past Medical History: Reviewed history from 04/21/2008 and no changes required. Hypertension Nephrolithiasis, hx of Peptic ulcer Low back pain  Past Surgical History: Reviewed history from 04/21/2008 and no changes required. Bone cancer rt femur removed & steel rod placed Prostatectomy  Family History: Reviewed history from 04/21/2008 and no changes required. Obstructive pulmonary disease Dad Family History of Prostate CA 1st degree relative <50  Social History: Reviewed history from 04/21/2008 and no changes required. 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 Take 1 capsule by mouth once a day 2)  Omeprazole 20 Mg Cpdr (Omeprazole) .Marland Kitchen.. 1 by mouth once daily 3)  Cyanocobalamin 1000 Mcg/ml Inj Soln (Cyanocobalamin) .... Inject once a month 4)  Clonidine Hcl 0.2 Mg Tabs (Clonidine hcl) .... Take one tab two times a day 5)  Acetaminophen-codeine #2 300-15 Mg Tabs (Acetaminophen-codeine) .... Take one tab three times a day as needed 6)  Cyclobenzaprine Hcl 10 Mg Tabs (Cyclobenzaprine hcl) .... Take one tab at bedtime 7)  Vitamin D 1000 Unit Tabs (Cholecalciferol) .... Take one tab two times a day 8)  Cardura 4 Mg Tabs (Doxazosin mesylate) .Marland Kitchen.. 1 tab @ bedtime for htn  Patient Instructions: 1)  increase the Cardura to 8 mg nightly BP check.  B.i.d. follow-up in 3 weeks.

## 2011-01-29 NOTE — Progress Notes (Signed)
Summary: Call A Nurse   Call-A-Nurse Triage Call Report Triage Record Num: [id number] Operator: Alphonsa Overall Patient Name: Bruce Ball Call Date & Time: 08/15/2010 10:02:43PM Patient Phone: [phone] PCP: Eugenio Hoes. Ronnita Paz Patient Gender: Male PCP Fax : [phone] Patient DOB: 1946-06-23 Practice Name: [facility] Reason for Call: Greggory Stallion calling about BP measurement. Onset 08/15/10. 164/83 this am 203/106 at 2130. Trend upward 08/15/10. Off Diltiazem 360mg  qhs and Clonidine 0.2mg  BID, Carvedilol 25mg  BID 08/13/10 per Dr Tawanna Cooler. Measuring BP am,12noon, and 9:30. Regular measurement this week at 2130 time 164/88.Headache,red face. 207/108 at 2215. Spoke with Dr Clent Ridges who states pt should be evaluated at [facility] ED 08/15/10. Pt aware and will go to ED. Protocol(s) Used: Hypertension, Diagnosed or Suspected Recommended Outcome per Protocol: See Provider within 4 hours Reason for Outcome: Systolic BP of >180 mmHg OR diastolic BP of >120 mmHg Care Advice:  ~ Another adult should drive. Call EMS 911 if new symptoms develop, such as severe shortness of breath, chest pain, change in mental status, acute neurologic deficit, seizure, visual disturbances, pulse rate > 120 / minute, or very irregular pulse.  ~  ~ Call provider if symptoms worsen or new symptoms develop.  ~ HEALTH PROMOTION / MAINTENANCE  ~ List, or take, all current prescription(s), nonprescription or alternative medication(s) to provider for evaluation. Medication Advice: - Discontinue all nonprescription and alternative medications, especially stimulants, until evaluated by provider. - Take prescribed medications as directed, following label instructions for the medication. - Do not change medications or dosing regimen until provider is consulted. - Know possible side effects of medication and what to do if they occur. - Tell provider all prescription, nonprescription or alternative medications that you take

## 2011-01-29 NOTE — Assessment & Plan Note (Signed)
Summary: W2A/FUP/RCD   Vital Signs:  Patient Profile:   65 Years Old Male Weight:      238 pounds Temp:     98.3 degrees F oral BP sitting:   128 / 84  (left arm)                 Chief Complaint:  follow up blood pressure.  History of Present Illness: Bruce Ball is a 65 year old male, who comes in today accompanied by his wife for evaluation of high blood pressure.  His current medications are diltiazem 360 mg daily, clonidine, .1, b.i.d., and Trandate 200 mg q.a.m.  His blood pressure today is 128/84.  However, he is having episodes where his blood pressure spikes.  Over this past weekend.  His systolic jumped up to hundred.  He felt week.  He felt dizzy and his wife Mrs. face with her flush.  As she checked his temperature and it was normal.  The episode lasted for a couple hours and then stopped.  He is compliant with his medications.  With this history we must rule out a pheochromocytoma.  I recommend starting an immediate workup today.  However, [facility] [place] [facility] because of cost issues.  [facility].  In the meantime, if he should have another spell like this is to go directly to the emergency room.    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 Take 1 tablet by mouth once a day  His updated medication list for this problem includes:    Diltiazem Hcl Er Beads 360 Mg Cp24 (Diltiazem hcl er beads) .Marland Kitchen... Take 1 capsule by mouth once a day    Clonidine Hcl 0.1 Mg Tabs (Clonidine hcl) .Marland Kitchen... Take 1 tablet by mouth two times a day    Trandate 200 Mg Tabs (Labetalol hcl) .Marland Kitchen... Take 1 tablet by mouth every morning   Complete Medication List: 1)  Diltiazem Hcl Er Beads 360 Mg Cp24 (Diltiazem hcl er beads) .... Take 1 capsule by mouth once a day 2)  Clonidine Hcl 0.1 Mg Tabs (Clonidine hcl) .... Take 1 tablet by mouth two times a day 3)  Omeprazole 20 Mg Cpdr (Omeprazole) .Marland Kitchen.. 1 by mouth  once daily 4)  Hydrocodone-acetaminophen 7.5-750 Mg Tabs (Hydrocodone-acetaminophen) .... As needed 5)  Trandate 200 Mg Tabs (Labetalol hcl) .... Take 1 tablet by mouth every morning 6)  Cyanocobalamin 1000 Mcg/ml Inj Soln (Cyanocobalamin) .... Inject once a month   Patient Instructions: 1)  per our discussion, I would recommend an immediate workup to rule out pheochromocytoma.  If this cannot be done at the [facility] in [place] I would recommend we do it here ASAP.  If in the meantime, he should have a spell where your blood pressure goest high as it did over this past weekend go directly to the emergency room.  Pressures as high can cause a stroke.   ]

## 2011-01-29 NOTE — Assessment & Plan Note (Signed)
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 who comes in today for evaluation of two weeks worries had episodes of lightheadedness, dizziness, confusion, and 1.1, driving, he thinks he passed out and became incontinent of stool.  His treatment program is complicated by the fact that he only comes in here episodically and gets most of his care at the [facility] in [place] or [place].  He is currently seeing the physicians at [facility] because of chronic right flank pain.  He states they did an MRI last week and had not called him the report.  His current blood medications are diltiazem 360 mg daily, clonidine .2 b.i.d., and corig 2.5 mg b.i.d.  Review of systems otherwise negative.  BP at home 104/46.  BP here 108/68, repeat by me same  Allergies: No Known Drug Allergies  Past History:  Past medical, surgical, family and social histories (including risk factors) reviewed for relevance to current acute and chronic problems.  Past Medical History: Reviewed history from 04/21/2008 and no changes required. Hypertension Nephrolithiasis, hx of Peptic ulcer Low back pain  Past Surgical History: Reviewed history from 04/21/2008 and no changes required. Bone cancer rt femur removed & steel rod placed Prostatectomy  Family History: Reviewed history from 04/21/2008 and no changes required. Obstructive pulmonary disease Dad Family History of Prostate CA 1st degree relative <50  Social History: Reviewed history from 04/21/2008 and no changes required. 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 Lungs are clear to auscultation, no crackles or wheezes. Heart:  Normal rate and regular rhythm. S1 and S2 normal without gallop, murmur, click, rub or other extra sounds. Abdomen:  Bowel sounds positive,abdomen soft and non-tender without masses, organomegaly or hernias noted.   Impression & Recommendations:  Problem # 1:  HYPOTENSION (ICD-458.9) Assessment New  Complete Medication List: 1)  Diltiazem Hcl Er Beads 360 Mg Cp24 (Diltiazem hcl er beads) .... Take 1 capsule by mouth once a day 2)  Omeprazole 20 Mg Cpdr (Omeprazole) .Marland Kitchen.. 1 by mouth once daily 3)  Cyanocobalamin 1000 Mcg/ml Inj Soln (Cyanocobalamin) .... Inject once a month 4)  Clonidine Hcl 0.2 Mg Tabs (Clonidine hcl) .... Take one tab two times a day 5)  Acetaminophen-codeine #2 300-15 Mg Tabs (Acetaminophen-codeine) .... Take one tab three times a day as needed 6)  Carvedilol 25 Mg Tabs (Carvedilol) .... Take one tab by mouth two times a day 7)  Hydrocodone-acetaminophen 5-500 Mg Tabs (Hydrocodone-acetaminophen) .... Take one tab by mouth two times a day as needed 8)  Acetaminophen-codeine #3 300-30 Mg Tabs (Acetaminophen-codeine) .... Take one tab by mouth three times a day as needed  Patient Instructions: 1)  stop all of your blood pressure medications.......Marland Kitchen diltiazem, clonidine, carvedilol,..............Marland Kitchen measure your blood pressure morning and at bedtime.  Return in one week for follow-up

## 2011-02-22 ENCOUNTER — Encounter: Payer: Self-pay | Admitting: Family Medicine

## 2011-02-22 ENCOUNTER — Ambulatory Visit (INDEPENDENT_AMBULATORY_CARE_PROVIDER_SITE_OTHER): Payer: Self-pay | Admitting: Family Medicine

## 2011-02-22 DIAGNOSIS — R51 Headache: Secondary | ICD-10-CM

## 2011-02-22 DIAGNOSIS — R519 Headache, unspecified: Secondary | ICD-10-CM

## 2011-02-22 DIAGNOSIS — R319 Hematuria, unspecified: Secondary | ICD-10-CM

## 2011-02-22 LAB — POCT URINALYSIS DIPSTICK
Blood, UA: NEGATIVE
Glucose, UA: NEGATIVE
Leukocytes, UA: NEGATIVE
Nitrite, UA: NEGATIVE
Spec Grav, UA: 1.025
Urobilinogen, UA: 0.2
pH, UA: 5

## 2011-02-22 NOTE — Progress Notes (Signed)
  Subjective:    Patient ID: Bruce Ball, male    DOB: [birth year], 65 y.o.   MRN: [id number]  HPI  Patient seen as a work in with the following concerns   recently at [facility] and had some urine blood on dipstick. Request repeat today. No gross hematuria. Denies any burning with urination.  Does have past history of kidney stones but no severe pain.   patient was getting stress test at the [facility] January 25. After IV access became dizzy without syncope. Sent to emergency room for further evaluation. Reportedly CT of the head unremarkable. Since that time has had daily somewhat progressive headache which is sharp and right frontal spreading toward the right occipital area. No fever. No sinus congestion. Questionable bilateral blurred vision intermittently. Taking aspirin without much relief. Also has noticed some bilateral upper extremity tremor which is new. No focal weakness. No known injury. No scalp or localized tenderness to palpation around the head region. No similar headache previously   Review of Systems  Constitutional: Negative for fever, chills, activity change, appetite change and fatigue.  HENT: Negative for hearing loss, congestion, facial swelling, rhinorrhea, neck pain, neck stiffness, sinus pressure and tinnitus.   Eyes: Negative for visual disturbance.  Respiratory: Negative for cough, chest tightness, shortness of breath, wheezing and stridor.   Cardiovascular: Negative for chest pain, palpitations and leg swelling.  Gastrointestinal: Negative for abdominal pain.  Genitourinary: Negative for dysuria, frequency, flank pain, discharge and testicular pain.  Musculoskeletal: Negative for back pain.  Neurological: Positive for tremors and headaches. Negative for seizures, syncope, facial asymmetry, speech difficulty and weakness.    patient denies any active or weight changes    Objective:   Physical Exam  patient is alert and in no distress.  Head is  atraumatic.   Scalp nontender to palpation Pupils equal reactive to light. Fundi no acute abnormality  Eardrums are normal Oropharynx is moist and clear Neck is supple no mass Chest clear to auscultation Heart regular rhythm and rate  Neuro exam cranial nerves II through XII are intact. He has some horizontal nystagmus. Strength no focal deficits. Cerebellar function normal by finger to nose testing. Gait is normal.       Assessment & Plan:   #1 atypical headaches.  #2 hematuria or recent dipstick with none noted today   given his age and atypical qualities we've recommended further evaluation of headache. We offered further evaluation here but he has medical coverage through the [facility] system and prefers there. He'll go there later today for further evaluation.

## 2011-02-22 NOTE — Patient Instructions (Signed)
You need further evaluation of your headache through the [facility].

## 2011-02-24 ENCOUNTER — Encounter: Payer: Self-pay | Admitting: Family Medicine

## 2011-03-15 LAB — POCT I-STAT, CHEM 8
BUN: 19 mg/dL (ref 6–23)
Calcium, Ion: 1.19 mmol/L (ref 1.12–1.32)
Chloride: 105 mEq/L (ref 96–112)
Creatinine, Ser: 1.5 mg/dL (ref 0.4–1.5)
Glucose, Bld: 102 mg/dL — ABNORMAL HIGH (ref 70–99)
HCT: 50 % (ref 39.0–52.0)
Hemoglobin: 17 g/dL (ref 13.0–17.0)
Potassium: 3.9 mEq/L (ref 3.5–5.1)
Sodium: 142 mEq/L (ref 135–145)
TCO2: 30 mmol/L (ref 0–100)

## 2011-03-22 LAB — BASIC METABOLIC PANEL
BUN: 22 mg/dL (ref 6–23)
CO2: 26 mEq/L (ref 19–32)
Calcium: 9 mg/dL (ref 8.4–10.5)
Chloride: 104 mEq/L (ref 96–112)
Creatinine, Ser: 1.36 mg/dL (ref 0.4–1.5)
GFR calc Af Amer: >60 mL/min (ref >60)
GFR calc non Af Amer: 53 mL/min — ABNORMAL LOW (ref >60)
Glucose, Bld: 134 mg/dL — ABNORMAL HIGH (ref 70–99)
Potassium: 4.6 mEq/L (ref 3.5–5.1)
Sodium: 138 mEq/L (ref 135–145)

## 2011-03-22 LAB — URINE CULTURE
Colony Count: NO GROWTH
Culture: NO GROWTH

## 2011-03-22 LAB — URINALYSIS, ROUTINE W REFLEX MICROSCOPIC
Glucose, UA: NEGATIVE mg/dL
Nitrite: NEGATIVE
Protein, ur: 30 mg/dL — AB
Specific Gravity, Urine: 1.026 (ref 1.005–1.030)
Urobilinogen, UA: 1 mg/dL (ref 0.0–1.0)
pH: 6 (ref 5.0–8.0)

## 2011-03-22 LAB — URINE MICROSCOPIC-ADD ON

## 2011-05-17 NOTE — Op Note (Signed)
[facility]  Patient:    Bruce Ball, Bruce Ball Visit Number: [id number] MRN: [id number]          Service Type: SUR Location: 3W 6578 01 Attending Physician:  Katherine Roan Dictated by:   Rozanna Boer., M.D. Proc. Date: 03/10/02 Admit Date:  03/10/2002                             Operative Report  PREOPERATIVE DIAGNOSIS:  T2B Gleason 3 + 3 adenocarcinoma of the prostate.  POSTOPERATIVE DIAGNOSIS:  T2B Gleason 3 + 3 adenocarcinoma of the prostate, pending pathology report.  PROCEDURE:  Radical retropubic prostatectomy, bilateral pelvic lymph node dissection.  ANESTHESIA:  General.  SURGEON:  Rozanna Boer., M.D.  ASSISTANT:  Verl Dicker, M.D.  DESCRIPTION OF PROCEDURE:  This 65 year old patient was placed on the operating room table in the supine position with a rolled towel under his sacrum where he underwent successful induction of general anesthesia, was shaved, prepped, and draped in the usual sterile fashion, and a #22 Foley was inserted and the bladder drained.  A midline incision was made, carried down through the subcutaneous tissue to expose the midline which was then opened, and the retroperitoneal space was entered.  Using a Bookwalter retractor, the wound was packed open, and the nodes on the patients right side between the external iliac, down to and including the obturator fossa, back to the bifurcation of the iliac was taken in a separate packet.  There really was not much tissue there.  These felt benign.  The obturator nerve was carefully preserved.  The endopelvic fascia was then incised.  On the left side, a similar packet was taken from the external iliac, down to and including the the obturator fossa, and back to the bifurcation of the iliac.  The efferent lymphatics were either clipped or tied as they were encountered and, again, this packet seemed to have benign nodes.  Again, the  endopelvic fascia was cut.  Then puboprostatic ligaments were then cut sharply under direct vision, and a Hohenfellner clamp was passed underneath the dorsal vein complex, tied twice with #1 Vicryl, and 0 Vicryl back tie was then made on the dorsal vein complex and one on the bladder neck.  The dorsal vein complex was then incised with the Bovie, exposing the urethra.  A right angle clamp was then placed underneath the urethra, and an umbilical tape was then passed underneath the urethra and held for traction.  The urethra was then incised under direct vision, and the Foley was then cut and pulled anteriorly, and the posterior wall of the urethra was then cut.  The incision was carried down to the Denonvilliers fascia, and the prostate was then dissected off the anterior rectal wall.  It was somewhat adherent from probably previous prostatitis, but it did free up.  I took the pedicle close to the prostate on both sides with silk suture back to the level of the seminal vesicles.  Again, there were just a fair amount of adhesions and scar tissue attached to the anterior wall of the rectum and posterior wall of the prostate.  At this point, attention was turned anteriorly where the urethra was dissected free, and the prostate was carefully separated from the bladder.  Again, this was also clearly adherent. The urethra was resected into the prostate and a right angle clamp passed underneath this, and the urethra was incised  with the stump under direct vision.  Then careful sharp and blunt dissection then used to dissect the prostate off the bladder where it was quite adherent.  In the midline, the vas deferens were identified, clipped, cut, transected, and the seminal vesicles were then dissected out to their tips and tied off and cut, and the prostate was then removed from the field.  Hemostasis was carefully achieved, and the bladder neck was then not very patulous, but it looked like  posteriorly the ureteral orifices were quite close.  I passed two #4 pigtail ureteral catheters up each ureter after giving methylene blue to identify these structures, and they were very close to the bladder neck.  The mucosa of the bladder neck was then everted to the bladder wall, and care was taken to carefully protect the orifices as the bladder neck was then reconstructed. When this was done, a Loma Boston was then passed per urethra, and five sutures were placed at 2, 5, 7, 9, and 12 oclock.  The patients left side of the urethra was a little bit torn, and there was better urethral stump on the left.  The Greenwald wound was then removed, and a #20 Foley catheter was passed per urethra into the bladder and the balloon inflated to 15 cc.  With the ureteral stent still in place, I placed two posterior stitches carefully from the corresponding urethral stitches to the bladder neck and then two lateral stitches and one at the apex.  When these were tied down, there was a significant leak on the patients left side where the urethra was not as strong.  The catheter seemed to irrigate fairly well.  There were no clots. Two JP drains were then placed, one on the right and one on the left, placed on either side of the midline, allowed the drainage of urine for awhile until this healed.  The midline fascia was closed with a running #1 PDS for the fascia, clips for the skin.  Sterile dry dressings were applied.  Estimated blood loss was 2000 cc.  He received two units of autologous blood but remained stable throughout the case.  Sponge, needle, and instrument counts were correct. Dictated by:   Rozanna Boer., M.D. Attending Physician:  Katherine Roan DD:  03/10/02 TD:  03/10/02 Job: [id number]

## 2011-05-17 NOTE — H&P (Signed)
[facility]  Patient:    Bruce Ball, Bruce Ball Visit Number: [id number] MRN: [id number]          Service Type: SUR Location: 3W 8756 01 Attending Physician:  Katherine Roan Dictated by:   Rozanna Boer., M.D. Admit Date:  03/10/2002                           History and Physical  HISTORY OF PRESENT ILLNESS:  This 65 year old white male is admitted with a clinical T2b Gleason 3 + 3 adenocarcinoma of the prostate.  Biopsy done in January showed a small amount of cancer bilaterally, less than 5% on each side.  PSA had increased to 11.7 in January, which prompted the biopsy.  He had had previous negative biopsies in January 2001 and August 2001.  His PSAs had ranged from 9-14 in the past.  He autodonated two units of blood and underwent a mechanical bowel prep, and enters now for radical surgery, understanding the risks including, but not limited to, incontinence, impotence, deep vein thrombosis, pulmonary emboli, bleeding, and death.  MEDICATIONS: 1. Flomax 0.4 mg a day. 2. Hydrochlorothiazide 25 mg a day.  ALLERGIES:  None.  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  REVIEW OF SYSTEMS:  He passed a kidney stone in 1985, without recurrence.  He has had mildly elevated blood pressure.  Distant history of bleeding ulcer in the past.  No change in bowel habits.  SOCIAL HISTORY:  He is a nonsmoker.  He is married, has two children.  FAMILY HISTORY:  Negative family history for cancer of the prostate.  His father died of emphysema at age 62.  His mother is still living.  PHYSICAL EXAMINATION:  VITAL SIGNS:  Weight 242.  Blood pressure 155/83, pulse 88, respirations 18.  GENERAL:  Healthy, middle-aged, dark-haired white male in no acute distress.  LUNGS:  Clear.  HEENT:  Negative.  HEART:  Heart sounds are regular, without murmurs, gallops, or rubs.  ABDOMEN:  Soft, benign.  Liver and  spleen nonpalpable.  He has a scar over his right hip from previous surgery.  GENITOURINARY:  Normal circumcised penis.  Bilaterally descended testes. Normal epididymis.  RECTAL:  Prostate about 60 g, not fixed or indurated.  SV not palpable.  EXTREMITIES:  Negative for edema.  IMPRESSION: 1. Clinical T2b Gleason 3 + 3 adenocarcinoma of the prostate. 2. Previous osteosarcoma right leg. 3. History of stone in 1985, with no recurrence. 4. Obstructive uropathy, on Flomax.  RECOMMENDATIONS:  Radical retropubic prostatectomy and bilateral pelvic lymph node dissection as indicated. Dictated by:   Rozanna Boer., M.D. Attending Physician:  Katherine Roan DD:  03/10/02 TD:  03/10/02 Job: [id number]

## 2011-05-17 NOTE — Discharge Summary (Signed)
[facility]  Patient:    Bruce Ball, Bruce Ball Visit Number: [id number] MRN: [id number]          Service Type: SUR Location: 3W 4782 01 Attending Physician:  Katherine Roan Dictated by:   Rozanna Boer., M.D. Admit Date:  03/10/2002 Discharge Date: 03/15/2002                             Discharge Summary  DISCHARGE DIAGNOSES: 1. T2c, Gleason 3 + 3 adenocarcinoma of the prostate. 2. Previous osteosarcoma, right leg. 3. History of stones. 4. Obstructive uropathy.  OPERATIONS AND PROCEDURES:  Radical retropubic prostatectomy, bilateral pelvic lymph node dissection on March 10, 2002.  HISTORY OF PRESENT ILLNESS:  This 65 year old patient is admitted with a clinical IIB, Gleason 3 + 3 adenocarcinoma of the prostate.  Biopsy done in January showed a small amount of cancer bilaterally, less than 5% on each side.  PSA had increased to 11.7 in January, which was the prompting for the biopsy.  He had previous negative biopsies January 2001 and August 2001.  PSAs have ranged from 9 to 14 in the past.  He autodonated two units of blood and entered now for surgery.  PAST MEDICAL HISTORY:  He had previous osteosarcoma of his right leg in 1971 without recurrence and passed a kidney stone in 1985 without recurrence.  He had a distant history of a bleeding ulcer.  HOSPITAL COURSE:  After satisfactory evaluation preoperatively with a hematocrit of 44%, normal renal function tests with a creatinine of 1.3, he was taken to the operating room on the day of admission, where he underwent a radical retropubic prostatectomy.  Pathology showed clinical T2c cancer involving both lobes with a small amount of cancer bilaterally.  There was no capsular extension, the seminal vesicles and nodes were free of tumor, and he did have a small bit of tumor in the right apex with some microscopic foci of tumor away from the apex, making the right apex margin  positive.  The remaining margins were free.  An estimated 5% of the tissue examined histologically was involved by cancer.  Postoperatively he did well.  He did receive two units of blood for some bleeding during surgery, but his hematocrit postop was 37 and 32% on the first and second postop day.  His renal function remained normal.  He did have a significant leak on the left side of his bladder-urethral anastomosis, so two JP drains were placed.  The one on the right was removed early on the second postop day, but the one on the left continued to drain until the fifth postop day, when it was removed. At that point it had drained less than 30 cc in 24 hours.  He was on a regular diet, ambulating well, and was discharged home at that time.  He will continue antibiotics for two more days and return to the office in two days for staple removal.  DISCHARGE MEDICATIONS:  He was given some hydrocodone for pain, and he was to continue his hydrochlorothiazide daily as he was before.  He will discontinue the Flomax that he was on previously.  DISPOSITION:  He was sent home in improved, ambulatory condition on a regular diet.  Since had prolonged drainage, will probably hold off on a cystogram and do it between two and three weeks time. Dictated by:   Rozanna Boer., M.D. Attending Physician:  Vic Blackbird Magill  Montez Hageman DD:  03/15/02 TD:  03/16/02 Job: [id number]

## 2011-06-27 ENCOUNTER — Encounter: Payer: Self-pay | Admitting: Family Medicine

## 2011-06-27 ENCOUNTER — Ambulatory Visit (INDEPENDENT_AMBULATORY_CARE_PROVIDER_SITE_OTHER): Payer: BC Managed Care – PPO | Admitting: Family Medicine

## 2011-06-27 DIAGNOSIS — I10 Essential (primary) hypertension: Secondary | ICD-10-CM

## 2011-06-27 DIAGNOSIS — Z125 Encounter for screening for malignant neoplasm of prostate: Secondary | ICD-10-CM

## 2011-06-27 DIAGNOSIS — M545 Low back pain, unspecified: Secondary | ICD-10-CM

## 2011-06-27 DIAGNOSIS — Z13220 Encounter for screening for lipoid disorders: Secondary | ICD-10-CM

## 2011-06-27 LAB — LIPID PANEL
Cholesterol: 203 mg/dL — ABNORMAL HIGH (ref 0–200)
HDL: 40.8 mg/dL (ref >39.00)
Total CHOL/HDL Ratio: 5
Triglycerides: 55 mg/dL (ref 0.0–149.0)
VLDL: 11 mg/dL (ref 0.0–40.0)

## 2011-06-27 LAB — BASIC METABOLIC PANEL
BUN: 18 mg/dL (ref 6–23)
CO2: 28 mEq/L (ref 19–32)
Calcium: 8.9 mg/dL (ref 8.4–10.5)
Chloride: 100 mEq/L (ref 96–112)
Creatinine, Ser: 1 mg/dL (ref 0.4–1.5)
GFR: 80.63 mL/min (ref >60.00)
Glucose, Bld: 95 mg/dL (ref 70–99)
Potassium: 4.7 mEq/L (ref 3.5–5.1)
Sodium: 137 mEq/L (ref 135–145)

## 2011-06-27 LAB — HEPATIC FUNCTION PANEL
ALT: 18 U/L (ref 0–53)
AST: 17 U/L (ref 0–37)
Albumin: 3.6 g/dL (ref 3.5–5.2)
Alkaline Phosphatase: 61 U/L (ref 39–117)
Bilirubin, Direct: 0.2 mg/dL (ref 0.0–0.3)
Total Bilirubin: 0.9 mg/dL (ref 0.3–1.2)
Total Protein: 6.2 g/dL (ref 6.0–8.3)

## 2011-06-27 LAB — CBC WITH DIFFERENTIAL/PLATELET
Basophils Absolute: 0 10*3/uL (ref 0.0–0.1)
Basophils Relative: 0.6 % (ref 0.0–3.0)
Eosinophils Absolute: 0.1 10*3/uL (ref 0.0–0.7)
Eosinophils Relative: 1 % (ref 0.0–5.0)
HCT: 44.5 % (ref 39.0–52.0)
Hemoglobin: 15.1 g/dL (ref 13.0–17.0)
Lymphocytes Relative: 24.8 % (ref 12.0–46.0)
Lymphs Abs: 1.6 10*3/uL (ref 0.7–4.0)
MCHC: 34 g/dL (ref 30.0–36.0)
MCV: 87.2 fl (ref 78.0–100.0)
Monocytes Absolute: 0.6 10*3/uL (ref 0.1–1.0)
Monocytes Relative: 9.5 % (ref 3.0–12.0)
Neutro Abs: 4.1 10*3/uL (ref 1.4–7.7)
Neutrophils Relative %: 64.1 % (ref 43.0–77.0)
Platelets: 228 10*3/uL (ref 150.0–400.0)
RBC: 5.1 Mil/uL (ref 4.22–5.81)
RDW: 13.4 % (ref 11.5–14.6)
WBC: 6.4 10*3/uL (ref 4.5–10.5)

## 2011-06-27 LAB — POCT URINALYSIS DIPSTICK
Blood, UA: NEGATIVE
Glucose, UA: NEGATIVE
Leukocytes, UA: NEGATIVE
Nitrite, UA: NEGATIVE
Spec Grav, UA: 1.025
Urobilinogen, UA: 1
pH, UA: 5.5

## 2011-06-27 LAB — TSH: TSH: 1.2 u[IU]/mL (ref 0.35–5.50)

## 2011-06-27 LAB — PSA: PSA: 0 ng/mL — ABNORMAL LOW (ref 0.10–4.00)

## 2011-06-27 LAB — LDL CHOLESTEROL, DIRECT: Direct LDL: 145.4 mg/dL

## 2011-06-27 LAB — VITAMIN B12: Vitamin B-12: 775 pg/mL (ref 211–911)

## 2011-06-27 NOTE — Progress Notes (Signed)
  Subjective:    Patient ID: Bruce Ball, male    DOB: 22-Aug-1946, 65 y.o.   MRN: [id number]  HPI  Dominico is a 65 year old, married male, nonsmoker, who comes in today for evaluation of hypertension, and other issues.  He is currently on Corgard 2.5 mg b.i.d., Norvasc, 10 mg daily, clonidine, @BARCODE2D (Error - No data available.)@, b.i.d., blood pressure 100/72, is lightheaded when he stands up, and he is tired and fatigue, and no energy.  We discussed the possible etiologies are most likely related to the clonidine.  Will start the cut that in half to see if that would help  He is also frustrated, because he's had continued back pain.  He is trying to go to the [place] to have evaluated.  He brings in a copy of a bone scan that was done on May 80 18th 2012.  However, he is not to go back for evaluation in the [place] until the end of July.  Bone scan was normal.  Does have a right hip prosthesis from previous right hip replacement.  Otherwise no evidence of any metastatic disease.  Now he is having trouble with his balance and coordination.  I think it would most efficient if we would have one of our neurologist see him here in [place].    Review of Systems    General cardiovascular, neuromuscular, resistance, otherwise, negative Objective:   Physical Exam    Lomotil and her span.  No acute distress    Assessment & Plan:  Hypertension under good control.  Her symptoms of fatigue, etc., may be clonidine, cut, clonidine dose in half.  Back pain, unknown etiology, with now problems with coordination.  Recommend neurologic evaluation safety

## 2011-06-27 NOTE — Patient Instructions (Signed)
For your hypertension, continued the carvedilol, one tablet twice daily, Norvasc, 10 mg in the morning, clonidine, one tablet at bedtime.  Check a blood pressure daily in the morning.  Goal 135/85.  Because of the ongoing back pain and now the other symptoms.  I would recommend we get a neurological consultation.  I will try to arrange that for you ASAP

## 2011-07-04 NOTE — Progress Notes (Signed)
patient  Is aware and copy mailed

## 2011-07-18 ENCOUNTER — Encounter: Payer: Self-pay | Admitting: Family Medicine

## 2011-07-18 ENCOUNTER — Ambulatory Visit (INDEPENDENT_AMBULATORY_CARE_PROVIDER_SITE_OTHER): Payer: Medicare Other | Admitting: Family Medicine

## 2011-07-18 DIAGNOSIS — M545 Low back pain, unspecified: Secondary | ICD-10-CM

## 2011-07-18 DIAGNOSIS — S60229A Contusion of unspecified hand, initial encounter: Secondary | ICD-10-CM

## 2011-07-18 DIAGNOSIS — I10 Essential (primary) hypertension: Secondary | ICD-10-CM

## 2011-07-18 NOTE — Patient Instructions (Signed)
Continue your current medications.  Follow-up with your neurology appointment the first week in August

## 2011-07-18 NOTE — Progress Notes (Signed)
  Subjective:    Patient ID: Bruce Ball, male    DOB: October 08, 1946, 65 y.o.   MRN: [id number]  Bruce Ball is a 65 year old, married male, nonsmoker, who comes in today for follow-up of hypertension.  His blood pressure now is 130/80 on Norvasc 10 mg daily, cord 25 mg b.i.d., Catapres, @BARCODE2D (Error - No data available.)@ b.i.d.  His vitamin B12 level is normal on B12 1 cc monthly.  Other labs are normal.  He does have a bruise on his left hand.  The rash.  The 3 small hemorrhagic areas.  No history of trauma.  CBC is normal.  His appointment in early August to see a neurologist here for evaluation of his chronic pain.    Review of Systems    General hematologic review of systems otherwise negative Objective:   Physical Exam    Well-developed and nourished, male in no acute distress.  Examination of left hand shows 3 small hemorrhagic areas.  Otherwise, normal    Assessment & Plan:  Hypertension under good control continue above therapy.  Bruises left hand x 3.  Observe.  Chronic pain.  Keep appointment at pain clinic and neurology

## 2011-09-13 ENCOUNTER — Ambulatory Visit: Payer: Medicare Other | Attending: Neurology | Admitting: Physical Therapy

## 2011-09-13 DIAGNOSIS — M6281 Muscle weakness (generalized): Secondary | ICD-10-CM | POA: Insufficient documentation

## 2011-09-13 DIAGNOSIS — R262 Difficulty in walking, not elsewhere classified: Secondary | ICD-10-CM | POA: Insufficient documentation

## 2011-09-13 DIAGNOSIS — IMO0001 Reserved for inherently not codable concepts without codable children: Secondary | ICD-10-CM | POA: Insufficient documentation

## 2011-09-13 DIAGNOSIS — R269 Unspecified abnormalities of gait and mobility: Secondary | ICD-10-CM | POA: Insufficient documentation

## 2011-09-26 LAB — CBC
HCT: 48.4
Hemoglobin: 16.3
MCHC: 33.8
MCV: 86
Platelets: 231
RBC: 5.63
RDW: 13.5
WBC: 8.6

## 2011-09-26 LAB — DIFFERENTIAL
Basophils Absolute: 0.1
Basophils Relative: 1
Eosinophils Absolute: 0.4
Eosinophils Relative: 5
Lymphocytes Relative: 30
Lymphs Abs: 2.6
Monocytes Absolute: 0.6
Monocytes Relative: 7
Neutro Abs: 5
Neutrophils Relative %: 57

## 2011-09-26 LAB — POCT I-STAT, CHEM 8
BUN: 18
Calcium, Ion: 1.15
Chloride: 107
Creatinine, Ser: 1.2
Glucose, Bld: 121 — ABNORMAL HIGH
HCT: 49
Hemoglobin: 16.7
Potassium: 3.6
Sodium: 142
TCO2: 25

## 2011-09-26 LAB — POCT CARDIAC MARKERS
CKMB, poc: <1 — ABNORMAL LOW
Myoglobin, poc: 62.3
Operator id: 264421
Troponin i, poc: <0.05

## 2011-09-26 LAB — URINALYSIS, ROUTINE W REFLEX MICROSCOPIC
Bilirubin Urine: NEGATIVE
Glucose, UA: NEGATIVE
Hgb urine dipstick: NEGATIVE
Ketones, ur: NEGATIVE
Nitrite: NEGATIVE
Protein, ur: NEGATIVE
Specific Gravity, Urine: 1.027
Urobilinogen, UA: 0.2
pH: 6

## 2012-01-14 DIAGNOSIS — F4542 Pain disorder with related psychological factors: Secondary | ICD-10-CM | POA: Diagnosis not present

## 2012-01-14 DIAGNOSIS — F4321 Adjustment disorder with depressed mood: Secondary | ICD-10-CM | POA: Diagnosis not present

## 2012-01-23 DIAGNOSIS — H2510 Age-related nuclear cataract, unspecified eye: Secondary | ICD-10-CM | POA: Diagnosis not present

## 2012-02-05 DIAGNOSIS — IMO0002 Reserved for concepts with insufficient information to code with codable children: Secondary | ICD-10-CM | POA: Diagnosis not present

## 2012-02-05 DIAGNOSIS — G894 Chronic pain syndrome: Secondary | ICD-10-CM | POA: Diagnosis not present

## 2012-03-11 DIAGNOSIS — F4542 Pain disorder with related psychological factors: Secondary | ICD-10-CM | POA: Diagnosis not present

## 2012-03-11 DIAGNOSIS — Z119 Encounter for screening for infectious and parasitic diseases, unspecified: Secondary | ICD-10-CM | POA: Diagnosis not present

## 2012-03-11 DIAGNOSIS — M5137 Other intervertebral disc degeneration, lumbosacral region: Secondary | ICD-10-CM | POA: Diagnosis not present

## 2012-03-11 DIAGNOSIS — G8929 Other chronic pain: Secondary | ICD-10-CM | POA: Diagnosis not present

## 2012-03-13 DIAGNOSIS — M546 Pain in thoracic spine: Secondary | ICD-10-CM | POA: Diagnosis not present

## 2012-03-17 DIAGNOSIS — C61 Malignant neoplasm of prostate: Secondary | ICD-10-CM | POA: Diagnosis not present

## 2012-03-17 DIAGNOSIS — M899 Disorder of bone, unspecified: Secondary | ICD-10-CM | POA: Diagnosis not present

## 2012-03-17 DIAGNOSIS — Z0389 Encounter for observation for other suspected diseases and conditions ruled out: Secondary | ICD-10-CM | POA: Diagnosis not present

## 2012-03-17 DIAGNOSIS — M949 Disorder of cartilage, unspecified: Secondary | ICD-10-CM | POA: Diagnosis not present

## 2012-03-26 DIAGNOSIS — I10 Essential (primary) hypertension: Secondary | ICD-10-CM | POA: Diagnosis not present

## 2012-03-26 DIAGNOSIS — M5137 Other intervertebral disc degeneration, lumbosacral region: Secondary | ICD-10-CM | POA: Diagnosis not present

## 2012-03-26 DIAGNOSIS — M1990 Unspecified osteoarthritis, unspecified site: Secondary | ICD-10-CM | POA: Diagnosis not present

## 2012-03-26 DIAGNOSIS — G894 Chronic pain syndrome: Secondary | ICD-10-CM | POA: Diagnosis not present

## 2012-03-26 DIAGNOSIS — K219 Gastro-esophageal reflux disease without esophagitis: Secondary | ICD-10-CM | POA: Diagnosis not present

## 2012-03-26 DIAGNOSIS — Z01818 Encounter for other preprocedural examination: Secondary | ICD-10-CM | POA: Diagnosis not present

## 2012-03-26 DIAGNOSIS — Z79899 Other long term (current) drug therapy: Secondary | ICD-10-CM | POA: Diagnosis not present

## 2012-03-26 DIAGNOSIS — R209 Unspecified disturbances of skin sensation: Secondary | ICD-10-CM | POA: Diagnosis not present

## 2012-03-26 DIAGNOSIS — F4542 Pain disorder with related psychological factors: Secondary | ICD-10-CM | POA: Diagnosis not present

## 2012-03-26 DIAGNOSIS — G893 Neoplasm related pain (acute) (chronic): Secondary | ICD-10-CM | POA: Diagnosis not present

## 2012-03-26 DIAGNOSIS — M546 Pain in thoracic spine: Secondary | ICD-10-CM | POA: Diagnosis not present

## 2012-03-26 DIAGNOSIS — G8929 Other chronic pain: Secondary | ICD-10-CM | POA: Diagnosis not present

## 2012-03-26 DIAGNOSIS — Z9889 Other specified postprocedural states: Secondary | ICD-10-CM | POA: Diagnosis not present

## 2012-03-27 DIAGNOSIS — Z9889 Other specified postprocedural states: Secondary | ICD-10-CM | POA: Diagnosis not present

## 2012-03-27 DIAGNOSIS — R209 Unspecified disturbances of skin sensation: Secondary | ICD-10-CM | POA: Diagnosis not present

## 2012-03-27 DIAGNOSIS — M5137 Other intervertebral disc degeneration, lumbosacral region: Secondary | ICD-10-CM | POA: Diagnosis not present

## 2012-03-27 DIAGNOSIS — G8929 Other chronic pain: Secondary | ICD-10-CM | POA: Diagnosis not present

## 2012-03-27 DIAGNOSIS — F4542 Pain disorder with related psychological factors: Secondary | ICD-10-CM | POA: Diagnosis not present

## 2012-03-27 DIAGNOSIS — Z79899 Other long term (current) drug therapy: Secondary | ICD-10-CM | POA: Diagnosis not present

## 2012-06-04 ENCOUNTER — Encounter: Payer: Self-pay | Admitting: Family Medicine

## 2012-06-08 ENCOUNTER — Ambulatory Visit (INDEPENDENT_AMBULATORY_CARE_PROVIDER_SITE_OTHER): Payer: Medicare Other | Admitting: Family Medicine

## 2012-06-08 ENCOUNTER — Encounter: Payer: Self-pay | Admitting: Family Medicine

## 2012-06-08 VITALS — BP 120/80 | HR 68 | Temp 98.8°F | Wt 228.0 lb

## 2012-06-08 DIAGNOSIS — R600 Localized edema: Secondary | ICD-10-CM | POA: Insufficient documentation

## 2012-06-08 DIAGNOSIS — R609 Edema, unspecified: Secondary | ICD-10-CM | POA: Diagnosis not present

## 2012-06-08 MED ORDER — HYDROCHLOROTHIAZIDE 25 MG PO TABS: 25.0000 mg | ORAL_TABLET | Freq: Every day | ORAL | Status: DC

## 2012-06-08 NOTE — Patient Instructions (Signed)
Hydrochlorothiazide 25 mg,,,,,,,,,, 1 tablet daily in the morning return when necessary

## 2012-06-08 NOTE — Progress Notes (Signed)
  Subjective:    Patient ID: Bruce Ball, male    DOB: 18-Apr-1946, 66 y.o.   MRN: [id number]  HPI Bruce Ball is a 66 year old married male nonsmoker who has underlying hypertension on Norvasc Corgard and Catapres DP 120/80 who comes in today with a 6 weeks history of the gradual onset of bilateral lower extremity edema  Dietary review of systems shows no added salt intake no history of trauma  He does have the spinal cord stimulator in and now his back pain is fairly well controlled. He sees a Midwife in [place]   Review of Systems General and cardiovascular review of systems otherwise negative    Objective:   Physical Exam Well-developed well-nourished male in no acute distress examination of the lower extremity shows the pulses to be normal the skin is normal there is 1+ edema bilaterally       Assessment & Plan:  Peripheral edema

## 2012-06-22 DIAGNOSIS — S335XXA Sprain of ligaments of lumbar spine, initial encounter: Secondary | ICD-10-CM | POA: Diagnosis not present

## 2012-06-22 DIAGNOSIS — M545 Low back pain, unspecified: Secondary | ICD-10-CM | POA: Diagnosis not present

## 2012-11-06 DIAGNOSIS — M545 Low back pain, unspecified: Secondary | ICD-10-CM | POA: Diagnosis not present

## 2012-11-06 DIAGNOSIS — IMO0001 Reserved for inherently not codable concepts without codable children: Secondary | ICD-10-CM | POA: Diagnosis not present

## 2012-11-06 DIAGNOSIS — S335XXA Sprain of ligaments of lumbar spine, initial encounter: Secondary | ICD-10-CM | POA: Diagnosis not present

## 2012-11-25 ENCOUNTER — Other Ambulatory Visit: Payer: Self-pay | Admitting: Physical Medicine and Rehabilitation

## 2012-11-25 ENCOUNTER — Ambulatory Visit
Admission: RE | Admit: 2012-11-25 | Discharge: 2012-11-25 | Disposition: A | Discharge status: Home / Self Care | Payer: Medicare Other | Source: Ambulatory Visit | Attending: Physical Medicine and Rehabilitation | Admitting: Physical Medicine and Rehabilitation

## 2012-11-25 DIAGNOSIS — M79604 Pain in right leg: Secondary | ICD-10-CM

## 2012-11-25 DIAGNOSIS — M47817 Spondylosis without myelopathy or radiculopathy, lumbosacral region: Secondary | ICD-10-CM | POA: Diagnosis not present

## 2012-11-25 DIAGNOSIS — M5126 Other intervertebral disc displacement, lumbar region: Secondary | ICD-10-CM | POA: Diagnosis not present

## 2012-11-25 DIAGNOSIS — M79605 Pain in left leg: Secondary | ICD-10-CM

## 2012-11-25 DIAGNOSIS — M47814 Spondylosis without myelopathy or radiculopathy, thoracic region: Secondary | ICD-10-CM | POA: Diagnosis not present

## 2012-11-25 DIAGNOSIS — M4804 Spinal stenosis, thoracic region: Secondary | ICD-10-CM | POA: Diagnosis not present

## 2012-11-25 DIAGNOSIS — M48061 Spinal stenosis, lumbar region without neurogenic claudication: Secondary | ICD-10-CM | POA: Diagnosis not present

## 2012-12-08 DIAGNOSIS — M25559 Pain in unspecified hip: Secondary | ICD-10-CM | POA: Diagnosis not present

## 2013-01-21 DIAGNOSIS — M5137 Other intervertebral disc degeneration, lumbosacral region: Secondary | ICD-10-CM | POA: Diagnosis not present

## 2013-01-21 DIAGNOSIS — M999 Biomechanical lesion, unspecified: Secondary | ICD-10-CM | POA: Diagnosis not present

## 2013-01-21 DIAGNOSIS — M25559 Pain in unspecified hip: Secondary | ICD-10-CM | POA: Diagnosis not present

## 2013-01-21 DIAGNOSIS — IMO0002 Reserved for concepts with insufficient information to code with codable children: Secondary | ICD-10-CM | POA: Diagnosis not present

## 2013-02-01 DIAGNOSIS — M25559 Pain in unspecified hip: Secondary | ICD-10-CM | POA: Diagnosis not present

## 2013-02-01 DIAGNOSIS — M999 Biomechanical lesion, unspecified: Secondary | ICD-10-CM | POA: Diagnosis not present

## 2013-02-01 DIAGNOSIS — M5137 Other intervertebral disc degeneration, lumbosacral region: Secondary | ICD-10-CM | POA: Diagnosis not present

## 2013-02-15 ENCOUNTER — Other Ambulatory Visit: Payer: Self-pay | Admitting: Physical Medicine and Rehabilitation

## 2013-02-15 DIAGNOSIS — M25552 Pain in left hip: Secondary | ICD-10-CM

## 2013-02-15 DIAGNOSIS — M545 Low back pain, unspecified: Secondary | ICD-10-CM

## 2013-02-19 ENCOUNTER — Other Ambulatory Visit: Payer: Self-pay | Admitting: Physical Medicine and Rehabilitation

## 2013-02-19 DIAGNOSIS — M545 Low back pain, unspecified: Secondary | ICD-10-CM

## 2013-02-19 DIAGNOSIS — M25552 Pain in left hip: Secondary | ICD-10-CM

## 2013-02-22 ENCOUNTER — Ambulatory Visit
Admission: RE | Admit: 2013-02-22 | Discharge: 2013-02-22 | Disposition: A | Discharge status: Home / Self Care | Payer: Medicare Other | Source: Ambulatory Visit | Attending: Physical Medicine and Rehabilitation | Admitting: Physical Medicine and Rehabilitation

## 2013-02-22 VITALS — BP 127/63 | HR 54

## 2013-02-22 DIAGNOSIS — M25552 Pain in left hip: Secondary | ICD-10-CM

## 2013-02-22 DIAGNOSIS — M545 Low back pain, unspecified: Secondary | ICD-10-CM

## 2013-02-22 DIAGNOSIS — M546 Pain in thoracic spine: Secondary | ICD-10-CM | POA: Diagnosis not present

## 2013-02-22 DIAGNOSIS — M5126 Other intervertebral disc displacement, lumbar region: Secondary | ICD-10-CM | POA: Diagnosis not present

## 2013-02-22 MED ORDER — MEPERIDINE HCL 100 MG/ML IJ SOLN
100.0000 mg | Freq: Once | INTRAMUSCULAR | Status: AC
  Administered 2013-02-22: 100 mg via INTRAMUSCULAR

## 2013-02-22 MED ORDER — DIAZEPAM 5 MG PO TABS
5.0000 mg | ORAL_TABLET | Freq: Once | ORAL | Status: AC
  Administered 2013-02-22: 5 mg via ORAL

## 2013-02-22 MED ORDER — IOHEXOL 300 MG/ML  SOLN: 10.0000 mL | Freq: Once | INTRAMUSCULAR | Status: DC | PRN

## 2013-02-22 MED ORDER — ONDANSETRON HCL 4 MG/2ML IJ SOLN
4.0000 mg | Freq: Once | INTRAMUSCULAR | Status: AC
  Administered 2013-02-22: 4 mg via INTRAMUSCULAR

## 2013-03-30 DIAGNOSIS — G894 Chronic pain syndrome: Secondary | ICD-10-CM | POA: Diagnosis not present

## 2013-03-30 DIAGNOSIS — IMO0002 Reserved for concepts with insufficient information to code with codable children: Secondary | ICD-10-CM | POA: Diagnosis not present

## 2013-04-20 DIAGNOSIS — IMO0002 Reserved for concepts with insufficient information to code with codable children: Secondary | ICD-10-CM | POA: Diagnosis not present

## 2013-04-20 DIAGNOSIS — G894 Chronic pain syndrome: Secondary | ICD-10-CM | POA: Diagnosis not present

## 2013-06-05 DIAGNOSIS — C61 Malignant neoplasm of prostate: Secondary | ICD-10-CM | POA: Insufficient documentation

## 2013-06-05 DIAGNOSIS — M4807 Spinal stenosis, lumbosacral region: Secondary | ICD-10-CM | POA: Insufficient documentation

## 2013-06-28 DIAGNOSIS — H2510 Age-related nuclear cataract, unspecified eye: Secondary | ICD-10-CM | POA: Diagnosis not present

## 2013-06-28 DIAGNOSIS — H26019 Infantile and juvenile cortical, lamellar, or zonular cataract, unspecified eye: Secondary | ICD-10-CM | POA: Diagnosis not present

## 2013-07-14 DIAGNOSIS — H2510 Age-related nuclear cataract, unspecified eye: Secondary | ICD-10-CM | POA: Diagnosis not present

## 2013-07-14 DIAGNOSIS — H26019 Infantile and juvenile cortical, lamellar, or zonular cataract, unspecified eye: Secondary | ICD-10-CM | POA: Diagnosis not present

## 2013-07-21 DIAGNOSIS — H26019 Infantile and juvenile cortical, lamellar, or zonular cataract, unspecified eye: Secondary | ICD-10-CM | POA: Diagnosis not present

## 2013-07-21 DIAGNOSIS — H2510 Age-related nuclear cataract, unspecified eye: Secondary | ICD-10-CM | POA: Diagnosis not present

## 2013-07-27 ENCOUNTER — Telehealth: Payer: Self-pay | Admitting: Family Medicine

## 2013-07-27 NOTE — Telephone Encounter (Signed)
Patient Information:  Caller Name: Indie  Phone: [phone]  Patient: Bruce, Ball  Gender: Male  DOB: May 11, 1946  Age: 67 Years  PCP: Kelle Darting [facility])  Office Follow Up:  Does the office need to follow up with this patient?: Yes  Instructions For The Office: No appts. available at [facility] office. Patient agrees to be seen at [facility] office but states other location is too far to travel. No appts. available at [facility] location. Please return call to patient regarding possible work in appt. Patient can be reached at [phone].  RN Note:  Patient states he is status post cataract surgery 07/21/13. Patient states he has had increased bruising at venipunture site on dorsum of left hand. States bruising extended from top of hand to beyond wrist extending to forearm 07/27/13. No drainage noted. Denies erythema or pain. No swelling noted. Care advice given per guidelines. Call back parameters reviewed. Patient verbalizes understanding. No appts. available at [facility] office. Patient agrees to be seen at [facility] office but states other location is too far to travel. No appts. available at [facility] location. Please return call to patient regarding possible work in appt. Patient can be reached at [phone].  Symptoms  Reason For Call & Symptoms: Intravenous site bruising  Reviewed Health History In EMR: Yes  Reviewed Medications In EMR: Yes  Reviewed Allergies In EMR: Yes  Reviewed Surgeries / Procedures: Yes  Date of Onset of Symptoms: 07/21/2013  Guideline(s) Used:  Puncture Wound  Disposition Per Guideline:   See Today in Office  Reason For Disposition Reached:   Pain or swelling present > 5 days  Advice Given:  Call Back If:  It begins to look infected (redness, red streaks, tenderness, pus, fever)  Pain becomes severe  You become worse.  Patient Will Follow Care Advice:  YES

## 2013-07-28 ENCOUNTER — Ambulatory Visit (INDEPENDENT_AMBULATORY_CARE_PROVIDER_SITE_OTHER): Payer: Medicare Other | Admitting: Internal Medicine

## 2013-07-28 ENCOUNTER — Encounter: Payer: Self-pay | Admitting: Internal Medicine

## 2013-07-28 VITALS — BP 128/80 | HR 69 | Temp 98.7°F | Resp 20 | Wt 223.0 lb

## 2013-07-28 DIAGNOSIS — S60229A Contusion of unspecified hand, initial encounter: Secondary | ICD-10-CM

## 2013-07-28 DIAGNOSIS — S60222A Contusion of left hand, initial encounter: Secondary | ICD-10-CM

## 2013-07-28 NOTE — Telephone Encounter (Signed)
Scheduled

## 2013-07-28 NOTE — Patient Instructions (Signed)
You  may move around, but avoid painful motions and activities.  Apply ice to the sore area for 15 to 20 minutes 3 or 4 times daily for the next two to 3 days.  Call or return to clinic prn if these symptoms worsen or fail to improve as anticipated.  

## 2013-07-28 NOTE — Progress Notes (Signed)
  Subjective:    Patient ID: Bruce Ball, male    DOB: 04/04/1946, 67 y.o.   MRN: [id number]  HPI 67 year old patient who has treated hypertension and chronic low back pain. He's recently had 2 states that can reduce traction surgeries and has had IV access placed on July 16 in July 23 in the dorsal aspect of the left hand. He has noticed some bruising and some mild discomfort.  Past Medical History  Diagnosis Date  . Extrinsic asthma, unspecified 04/07/2009  . HYPERTENSION 10/29/2007  . HYPOTENSION 08/13/2010  . LOW BACK PAIN 04/21/2008  . NEPHROLITHIASIS, HX OF 04/21/2008  . PUD, HX OF 04/21/2008    History   Social History  . Marital Status: Married    Spouse Name: N/A    Number of Children: N/A  . Years of Education: N/A   Occupational History  . Not on file.   Social History Main Topics  . Smoking status: Former Smoker    Quit date: 12/30/1968  . Smokeless tobacco: Not on file  . Alcohol Use: Yes  . Drug Use: Not on file  . Sexually Active: Not on file   Other Topics Concern  . Not on file   Social History Narrative  . No narrative on file    Past Surgical History  Procedure Laterality Date  . Bone cancer      rt femur removed and steel rod placed  . Prosatectomy      Family History  Problem Relation Age of Onset  . COPD Father   . Cancer Other     prostate    No Known Allergies  Current Outpatient Prescriptions on File Prior to Visit  Medication Sig Dispense Refill  . amLODipine (NORVASC) 10 MG tablet Take 10 mg by mouth daily.        . carvedilol (COREG) 25 MG tablet Take 25 mg by mouth 2 (two) times daily with a meal.        . cloNIDine (CATAPRES) 0.2 MG tablet Take 0.2 mg by mouth at bedtime.       . cyanocobalamin (,VITAMIN B-12,) 1000 MCG/ML injection Inject 1,000 mcg into the muscle every 30 (thirty) days.        Marland Kitchen HYDROcodone-acetaminophen (VICODIN) 5-500 MG per tablet Take 1 tablet by mouth 2 (two) times daily as needed.        Marland Kitchen omeprazole  (PRILOSEC) 20 MG capsule Take 20 mg by mouth 2 (two) times daily.       . hydrochlorothiazide (HYDRODIURIL) 25 MG tablet Take 1 tablet (25 mg total) by mouth daily.  90 tablet  3   No current facility-administered medications on file prior to visit.    BP 128/80  Pulse 69  Temp(Src) 98.7 F (37.1 C) (Oral)  Resp 20  Wt 223 lb (101.152 kg)  BMI 30.24 kg/m2  SpO2 97%      Review of Systems  Musculoskeletal: Positive for back pain.  Skin: Positive for rash and wound.       Objective:   Physical Exam  Constitutional:  Pressure 128/80  Skin:  Ecchymoses over the dorsal aspect of the left hand and wrist No phlebitis or evidence of cellulitis          Assessment & Plan:   Traumatic ecchymoses the dorsal aspect left hand. Local lung care discussed Hypertension stable

## 2013-09-02 DIAGNOSIS — M25559 Pain in unspecified hip: Secondary | ICD-10-CM | POA: Diagnosis not present

## 2013-10-19 DIAGNOSIS — M25559 Pain in unspecified hip: Secondary | ICD-10-CM | POA: Diagnosis not present

## 2013-10-19 DIAGNOSIS — IMO0002 Reserved for concepts with insufficient information to code with codable children: Secondary | ICD-10-CM | POA: Diagnosis not present

## 2013-10-19 DIAGNOSIS — IMO0001 Reserved for inherently not codable concepts without codable children: Secondary | ICD-10-CM | POA: Diagnosis not present

## 2013-10-19 DIAGNOSIS — M5137 Other intervertebral disc degeneration, lumbosacral region: Secondary | ICD-10-CM | POA: Diagnosis not present

## 2013-11-17 DIAGNOSIS — R5381 Other malaise: Secondary | ICD-10-CM | POA: Diagnosis not present

## 2013-11-17 DIAGNOSIS — M2550 Pain in unspecified joint: Secondary | ICD-10-CM | POA: Diagnosis not present

## 2013-11-17 DIAGNOSIS — Z79899 Other long term (current) drug therapy: Secondary | ICD-10-CM | POA: Diagnosis not present

## 2013-11-22 DIAGNOSIS — Z961 Presence of intraocular lens: Secondary | ICD-10-CM | POA: Diagnosis not present

## 2013-12-09 DIAGNOSIS — IMO0002 Reserved for concepts with insufficient information to code with codable children: Secondary | ICD-10-CM | POA: Diagnosis not present

## 2013-12-09 DIAGNOSIS — G894 Chronic pain syndrome: Secondary | ICD-10-CM | POA: Diagnosis not present

## 2013-12-09 DIAGNOSIS — M5137 Other intervertebral disc degeneration, lumbosacral region: Secondary | ICD-10-CM | POA: Diagnosis not present

## 2013-12-09 DIAGNOSIS — IMO0001 Reserved for inherently not codable concepts without codable children: Secondary | ICD-10-CM | POA: Diagnosis not present

## 2014-03-14 DIAGNOSIS — G894 Chronic pain syndrome: Secondary | ICD-10-CM | POA: Diagnosis not present

## 2014-03-14 DIAGNOSIS — IMO0001 Reserved for inherently not codable concepts without codable children: Secondary | ICD-10-CM | POA: Diagnosis not present

## 2014-03-14 DIAGNOSIS — IMO0002 Reserved for concepts with insufficient information to code with codable children: Secondary | ICD-10-CM | POA: Diagnosis not present

## 2014-03-14 DIAGNOSIS — M5137 Other intervertebral disc degeneration, lumbosacral region: Secondary | ICD-10-CM | POA: Diagnosis not present

## 2014-03-29 DIAGNOSIS — Z13820 Encounter for screening for osteoporosis: Secondary | ICD-10-CM | POA: Diagnosis not present

## 2014-04-27 DIAGNOSIS — M25579 Pain in unspecified ankle and joints of unspecified foot: Secondary | ICD-10-CM | POA: Diagnosis not present

## 2014-04-27 DIAGNOSIS — IMO0002 Reserved for concepts with insufficient information to code with codable children: Secondary | ICD-10-CM | POA: Diagnosis not present

## 2014-04-27 DIAGNOSIS — M5137 Other intervertebral disc degeneration, lumbosacral region: Secondary | ICD-10-CM | POA: Diagnosis not present

## 2014-04-27 DIAGNOSIS — IMO0001 Reserved for inherently not codable concepts without codable children: Secondary | ICD-10-CM | POA: Diagnosis not present

## 2014-04-27 DIAGNOSIS — M545 Low back pain, unspecified: Secondary | ICD-10-CM | POA: Diagnosis not present

## 2014-04-29 DIAGNOSIS — Z113 Encounter for screening for infections with a predominantly sexual mode of transmission: Secondary | ICD-10-CM | POA: Diagnosis not present

## 2014-04-29 DIAGNOSIS — Z79899 Other long term (current) drug therapy: Secondary | ICD-10-CM | POA: Diagnosis not present

## 2014-04-29 DIAGNOSIS — Z111 Encounter for screening for respiratory tuberculosis: Secondary | ICD-10-CM | POA: Diagnosis not present

## 2014-04-29 DIAGNOSIS — R5381 Other malaise: Secondary | ICD-10-CM | POA: Diagnosis not present

## 2014-05-04 ENCOUNTER — Other Ambulatory Visit (HOSPITAL_COMMUNITY): Payer: Self-pay | Admitting: Rheumatology

## 2014-05-04 ENCOUNTER — Ambulatory Visit (HOSPITAL_COMMUNITY)
Admission: RE | Admit: 2014-05-04 | Discharge: 2014-05-04 | Disposition: A | Discharge status: Home / Self Care | Payer: Medicare Other | Source: Ambulatory Visit | Attending: Rheumatology | Admitting: Rheumatology

## 2014-05-04 DIAGNOSIS — J8410 Pulmonary fibrosis, unspecified: Secondary | ICD-10-CM | POA: Diagnosis not present

## 2014-05-04 DIAGNOSIS — R52 Pain, unspecified: Secondary | ICD-10-CM

## 2014-05-04 DIAGNOSIS — R918 Other nonspecific abnormal finding of lung field: Secondary | ICD-10-CM | POA: Insufficient documentation

## 2014-05-24 ENCOUNTER — Encounter: Payer: Self-pay | Admitting: Internal Medicine

## 2014-05-24 ENCOUNTER — Ambulatory Visit (INDEPENDENT_AMBULATORY_CARE_PROVIDER_SITE_OTHER): Payer: Medicare Other | Admitting: Internal Medicine

## 2014-05-24 VITALS — BP 126/80 | HR 58 | Ht 71.0 in | Wt 225.2 lb

## 2014-05-24 DIAGNOSIS — M452 Ankylosing spondylitis of cervical region: Secondary | ICD-10-CM | POA: Insufficient documentation

## 2014-05-24 DIAGNOSIS — R911 Solitary pulmonary nodule: Secondary | ICD-10-CM | POA: Diagnosis not present

## 2014-05-24 DIAGNOSIS — M459 Ankylosing spondylitis of unspecified sites in spine: Secondary | ICD-10-CM

## 2014-05-24 NOTE — Patient Instructions (Signed)
Please do CT chest wo contrast; will call with results to decide followup You can do TB testing through Dr Estanislado Pandy Abel Presto)

## 2014-05-24 NOTE — Progress Notes (Addendum)
Subjective:    Patient ID: Bruce Ball, male    DOB: 01-22-1946, 68 y.o.   MRN: [id number] PCP Bruce Ball,Bruce Zenia Resides, Bruce Ball REferred by Bruce Ball HPI  IOV 05/24/2014  Chief Complaint  Patient presents with  . Pulmonary Consult    Referred by Bruce Ball for h/o lung nodule.     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 I spoke referring physician Bruce Ball). Patient in 1970s had Rt femur surgery for cancer NOS and has rod. AFtert that has had chronic back ache that is progressively worse and severe. Diagnosed now as [id number] ankylosing spondylitis (Apparently has classic bamboo spine). Rx sulfasalasize past 2 weeks (making him tired than usual). Bruce Ball is considering Enbrel like drugs in future. CXR showed 75mm left mid zone lung nodule and therefoe referrefed. He has not had cT chest yet. Denies active resp symptioms  CLINICAL DATA: Immunosuppressive therapy.  EXAM:  CHEST 2 VIEW  COMPARISON: Thoracic spine CT 02/22/2013 and thoracic spine  radiographs 12/22/2009  FINDINGS:  The cardiomediastinal silhouette is within normal limits. Calcified  left hilar lymph nodes are noted. 5 mm calcified granuloma is noted  in the left mid lung. The lungs are well inflated and otherwise  clear. No pleural effusion or pneumothorax is seen. Multilevel  osteophytosis is present in the thoracic spine. Spinal cord  stimulator leads terminate at the T8 level.  IMPRESSION:  No active cardiopulmonary disease.  Electronically Signed  By: Bruce Ball  On: 05/04/2014 07:53    Lung nodule relevant hx  -  reports that he quit smoking about 35 years ago. He has never used smokeless tobacco. SMoked 1 pack per day x 12 years - GRew up in [place] - spent > 30 years there - AS a kid was exposed to an uncle who was [place] and had active Pulm Tb upong return. Does not recollect Tb testing on self at any point    Workup with Bruce Ball reviewed after this visit on  05/27/2014. :abs dpme 04/29/14  - Quantiferon gold - negatve Hep Virus panel - negative  HIV - negative   Past Medical History  Diagnosis Date  . Extrinsic asthma, unspecified 04/07/2009  . HYPERTENSION 10/29/2007  . HYPOTENSION 08/13/2010  . LOW BACK PAIN 04/21/2008  . NEPHROLITHIASIS, HX OF 04/21/2008  . PUD, HX OF 04/21/2008     Family History  Problem Relation Age of Onset  . COPD Father   . Cancer Other     prostate     History   Social History  . Marital Status: Married    Spouse Name: N/A    Number of Children: N/A  . Years of Education: N/A   Occupational History  . accountant    Social History Main Topics  . Smoking status: Former Smoker    Quit date: 12/30/1978  . Smokeless tobacco: Never Used  . Alcohol Use: No  . Drug Use: Not on file  . Sexual Activity: Not on file   Other Topics Concern  . Not on file   Social History Narrative  . No narrative on file     No Known Allergies   Outpatient Prescriptions Prior to Visit  Medication Sig Dispense Refill  . amLODipine (NORVASC) 10 MG tablet Take 10 mg by mouth daily.        . carvedilol (COREG) 25 MG tablet Take 25 mg by mouth 2 (two) times daily with a meal.        .  cloNIDine (CATAPRES) 0.2 MG tablet Take 0.2 mg by mouth at bedtime.       . cyanocobalamin (,VITAMIN B-12,) 1000 MCG/ML injection Inject 1,000 mcg into the muscle every 30 (thirty) days.        Marland Kitchen HYDROcodone-acetaminophen (VICODIN) 5-500 MG per tablet Take 1 tablet by mouth 2 (two) times daily as needed.        Marland Kitchen omeprazole (PRILOSEC) 20 MG capsule Take 20 mg by mouth 2 (two) times daily.       . hydrochlorothiazide (HYDRODIURIL) 25 MG tablet Take 1 tablet (25 mg total) by mouth daily.  90 tablet  3   No facility-administered medications prior to visit.      Review of Systems  Constitutional: Negative for fever and unexpected weight change.  HENT: Negative for congestion, dental problem, ear pain, nosebleeds, postnasal drip, rhinorrhea,  sinus pressure, sneezing, sore throat and trouble swallowing.   Eyes: Negative for redness and itching.  Respiratory: Positive for chest tightness and shortness of breath. Negative for cough and wheezing.   Cardiovascular: Negative for palpitations and leg swelling.  Gastrointestinal: Negative for nausea and vomiting.  Genitourinary: Negative for dysuria.  Musculoskeletal: Positive for joint swelling.  Skin: Negative for rash.  Neurological: Negative for headaches.  Hematological: Does not bruise/bleed easily.  Psychiatric/Behavioral: Negative for dysphoric mood. The patient is not nervous/anxious.        Objective:   Physical Exam  Nursing note and vitals reviewed. Constitutional: He is oriented to person, place, and time. He appears well-developed and well-nourished. No distress.  Body mass index is 31.42 kg/(m^2).   HENT:  Head: Normocephalic and atraumatic.  Right Ear: External ear normal.  Left Ear: External ear normal.  Mouth/Throat: Oropharynx is clear and moist. No oropharyngeal exudate.  Eyes: Conjunctivae and EOM are normal. Pupils are equal, round, and reactive to light. Right eye exhibits no discharge. Left eye exhibits no discharge. No scleral icterus.  Neck: Normal range of motion. Neck supple. No JVD present. No tracheal deviation present. No thyromegaly present.  Cardiovascular: Normal rate, regular rhythm and intact distal pulses.  Exam reveals no gallop and no friction rub.   No murmur heard. Pulmonary/Chest: Effort normal and breath sounds normal. No respiratory distress. He has no wheezes. He has no rales. He exhibits no tenderness.  Abdominal: Soft. Bowel sounds are normal. He exhibits no distension and no mass. There is no tenderness. There is no rebound and no guarding.  Mild visceral obesity  Musculoskeletal: Normal range of motion. He exhibits no edema and no tenderness.  Stiff spine and mild antalgic stance  Lymphadenopathy:    He has no cervical  adenopathy.  Neurological: He is alert and oriented to person, place, and time. He has normal reflexes. No cranial nerve deficit. Coordination normal.  Skin: Skin is warm and dry. No rash noted. He is not diaphoretic. No erythema. No pallor.  Psychiatric: His behavior is normal. Judgment and thought content normal.  Mild flat affect +   Filed Vitals:   05/24/14 0932  BP: 126/80  Pulse: 58  Height: 5\' 11"  (1.803 m)  Weight: 225 lb 3.2 oz (102.15 kg)  SpO2: 95%          Assessment & Plan:  Please do CT chest wo contrast; will call with results to decide followup You can do TB testing through Bruce Ball)' (updated, notes reviewed from Bruce Bronson Curb and his Quantiferon gold is negative)

## 2014-05-24 NOTE — Assessment & Plan Note (Signed)
He has 52mm calcified nodule on May 2015 cxr  In left mid zone. This could represent histo from living in [place] or primary complex after being exposed as a kid to [place] uncle who had MTb in the 53s. His risk for lung cancre is low because he is </= 12ppd smoker.   For immunosuppressive Rx, only risk that can be adjusted downward is MTb through Tb testing and INH. HIsto can be difficult to predict but if all he has is calcified granuloma he should be okay for ENbrel  Will get CT chest to clarify and decide what nodules he has

## 2014-05-27 ENCOUNTER — Other Ambulatory Visit: Payer: Medicare Other

## 2014-06-01 ENCOUNTER — Ambulatory Visit (INDEPENDENT_AMBULATORY_CARE_PROVIDER_SITE_OTHER)
Admission: RE | Admit: 2014-06-01 | Discharge: 2014-06-01 | Disposition: A | Discharge status: Home / Self Care | Payer: Medicare Other | Source: Ambulatory Visit | Attending: Internal Medicine | Admitting: Internal Medicine

## 2014-06-01 DIAGNOSIS — R911 Solitary pulmonary nodule: Secondary | ICD-10-CM | POA: Diagnosis not present

## 2014-06-01 DIAGNOSIS — M459 Ankylosing spondylitis of unspecified sites in spine: Secondary | ICD-10-CM | POA: Diagnosis not present

## 2014-06-01 DIAGNOSIS — J8410 Pulmonary fibrosis, unspecified: Secondary | ICD-10-CM | POA: Diagnosis not present

## 2014-06-02 ENCOUNTER — Telehealth: Payer: Self-pay | Admitting: Internal Medicine

## 2014-06-02 NOTE — Telephone Encounter (Signed)
Called patient to gie CT Result . Left message to call back   CT shows clear lung fields. Only 1 calcified granuloma left upper lobe and left hilar node calcified (possibly old histo). No further CT chest needed. No contra-indication for immuno-modulatory Rx by rheumatology   He can follow up here with me if he has concerns of dyspnea or cough; otherwise no need.    Please let me know  Please fax telephone note to Dr Bo Merino when complete through epic routing   Thanks  Dr. Brand Males, M.D., [facility].C.P Pulmonary and Critical Care Medicine Staff Physician [facility] Pager: [phone], If no answer or between  15:00h - 7:00h: call [phone]  06/02/2014 2:18 PM    Ct Chest Wo Contrast  06/01/2014   CLINICAL DATA:  Followup lung nodule, smoker, ankylosing spondylitis  EXAM: CT CHEST WITHOUT CONTRAST  TECHNIQUE: Multidetector CT imaging of the chest was performed following the standard protocol without IV contrast.  COMPARISON:  Chest radiograph 05/04/2014. No prior dedicated chest CT. Thoracic spine CT 11/25/2012 is reviewed.  FINDINGS: Calcified of left upper lobe granuloma and left hilar nodes reidentified. Lungs are otherwise clear. Central airways are patent. No pleural effusion.  Mild atheromatous aortic calcification and coronary arterial calcification without aneurysm. Trace pericardial fluid. Incomplete imaging of the upper abdomen demonstrate a probable right upper renal pole calculus image 64, last image of the exam, incompletely imaged. Too small to characterize sub cm hepatic hypodense lesions are noted at the hepatic dome, statistically most likely cysts or hamartomas. Splenic granulomas are incidentally noted. No lymphadenopathy.  Spinal stimulator in place. No significant change in superior endplate T3 compression deformity versus Schmorl node. No acute osseous abnormality.  IMPRESSION: Stable evidence of  granulomatous disease including calcified left upper lobe granuloma. No specific imaging followup for this benign finding is needed.  No acute cardiopulmonary process.   Electronically Signed   By: Conchita Paris M.D.   On: 06/01/2014 16:12

## 2014-06-03 NOTE — Telephone Encounter (Signed)
Called spoke with patient, advised of CT Chest results as stated by MR below.  Pt verbalized his understanding and denied any questions/concerns at this time; he will cal the office if needed.  Will sign off.

## 2014-06-03 NOTE — Telephone Encounter (Signed)
Pt returned call, please call back at [phone]

## 2014-06-10 DIAGNOSIS — Z79899 Other long term (current) drug therapy: Secondary | ICD-10-CM | POA: Diagnosis not present

## 2014-07-14 DIAGNOSIS — M459 Ankylosing spondylitis of unspecified sites in spine: Secondary | ICD-10-CM | POA: Diagnosis not present

## 2014-07-14 DIAGNOSIS — M949 Disorder of cartilage, unspecified: Secondary | ICD-10-CM | POA: Diagnosis not present

## 2014-07-14 DIAGNOSIS — M545 Low back pain, unspecified: Secondary | ICD-10-CM | POA: Diagnosis not present

## 2014-07-14 DIAGNOSIS — M5137 Other intervertebral disc degeneration, lumbosacral region: Secondary | ICD-10-CM | POA: Diagnosis not present

## 2014-07-14 DIAGNOSIS — M899 Disorder of bone, unspecified: Secondary | ICD-10-CM | POA: Diagnosis not present

## 2014-08-16 DIAGNOSIS — M459 Ankylosing spondylitis of unspecified sites in spine: Secondary | ICD-10-CM | POA: Diagnosis not present

## 2014-08-16 DIAGNOSIS — M545 Low back pain, unspecified: Secondary | ICD-10-CM | POA: Diagnosis not present

## 2014-08-16 DIAGNOSIS — M461 Sacroiliitis, not elsewhere classified: Secondary | ICD-10-CM | POA: Diagnosis not present

## 2014-08-16 DIAGNOSIS — Z09 Encounter for follow-up examination after completed treatment for conditions other than malignant neoplasm: Secondary | ICD-10-CM | POA: Diagnosis not present

## 2014-10-18 DIAGNOSIS — M459 Ankylosing spondylitis of unspecified sites in spine: Secondary | ICD-10-CM | POA: Diagnosis not present

## 2014-11-02 DIAGNOSIS — B029 Zoster without complications: Secondary | ICD-10-CM | POA: Diagnosis not present

## 2014-11-02 DIAGNOSIS — Z09 Encounter for follow-up examination after completed treatment for conditions other than malignant neoplasm: Secondary | ICD-10-CM | POA: Diagnosis not present

## 2014-11-02 DIAGNOSIS — M5137 Other intervertebral disc degeneration, lumbosacral region: Secondary | ICD-10-CM | POA: Diagnosis not present

## 2014-11-02 DIAGNOSIS — M459 Ankylosing spondylitis of unspecified sites in spine: Secondary | ICD-10-CM | POA: Diagnosis not present

## 2014-12-17 DIAGNOSIS — R21 Rash and other nonspecific skin eruption: Secondary | ICD-10-CM | POA: Diagnosis not present

## 2014-12-17 DIAGNOSIS — B029 Zoster without complications: Secondary | ICD-10-CM | POA: Diagnosis not present

## 2014-12-30 DIAGNOSIS — E041 Nontoxic single thyroid nodule: Secondary | ICD-10-CM

## 2014-12-30 HISTORY — DX: Nontoxic single thyroid nodule: E04.1

## 2015-02-03 DIAGNOSIS — M459 Ankylosing spondylitis of unspecified sites in spine: Secondary | ICD-10-CM | POA: Diagnosis not present

## 2015-02-03 DIAGNOSIS — M533 Sacrococcygeal disorders, not elsewhere classified: Secondary | ICD-10-CM | POA: Diagnosis not present

## 2015-02-03 DIAGNOSIS — R21 Rash and other nonspecific skin eruption: Secondary | ICD-10-CM | POA: Diagnosis not present

## 2015-02-03 DIAGNOSIS — M545 Low back pain: Secondary | ICD-10-CM | POA: Diagnosis not present

## 2015-03-23 ENCOUNTER — Ambulatory Visit (INDEPENDENT_AMBULATORY_CARE_PROVIDER_SITE_OTHER): Payer: Medicare Other | Admitting: Family Medicine

## 2015-03-23 ENCOUNTER — Encounter: Payer: Self-pay | Admitting: Family Medicine

## 2015-03-23 VITALS — BP 94/60 | HR 60 | Temp 98.1°F | Wt 246.0 lb

## 2015-03-23 DIAGNOSIS — T148 Other injury of unspecified body region: Secondary | ICD-10-CM

## 2015-03-23 DIAGNOSIS — Z23 Encounter for immunization: Secondary | ICD-10-CM | POA: Diagnosis not present

## 2015-03-23 DIAGNOSIS — T148XXA Other injury of unspecified body region, initial encounter: Secondary | ICD-10-CM

## 2015-03-23 NOTE — Patient Instructions (Signed)
I think you have poor wound healing related to prednisone  Updated tetanus today  Let's see if a drier environment will help this heal instead of the neosporin. Use the nonstick dressing, then cover with gauze.   The wound does not look infected. If you develop fevers, expanding redness, pus coming from the wound, see [name] immediately. I think you will be able to fight off infection despite not using neosporin.   See me in 2 weeks to reassess. If it persists, may get some bloodwork related to clotting and consider wound care referral.

## 2015-03-23 NOTE — Progress Notes (Signed)
  Bruce Reddish, MD Phone: [phone]  Subjective:   Bruce Ball is a 69 y.o. year old very pleasant male patient who presents with the following:  Nonhealing wound on left hand -10mg  prednisone a day, trying to start Humira for ankylosing spondylitis under rheum care.   Wound for 2 weeks. Has history of multiple small spots on his hand spontaneously opening and bleeding. Had a similar small one and when pulled bandaid off, ripped the skin and has not had healed since that time.   He has been caring for wound with neosporin, gauze, then wrapping hand. Area has not been growing but continues to bleed at times. Has a history of some easy bleeding and breaking open of skin. Not on any blood thinners including OTC aspirin.   ROS- no fever/chills/expanding redness.   Past Medical History- HTN, ankylosing spondylitis  Medications- reviewed and updated Current Outpatient Prescriptions  Medication Sig Dispense Refill  . amLODipine (NORVASC) 10 MG tablet Take 10 mg by mouth daily.      . carvedilol (COREG) 25 MG tablet Take 25 mg by mouth 2 (two) times daily with a meal.      . cloNIDine (CATAPRES) 0.2 MG tablet Take 0.2 mg by mouth at bedtime.     . cyanocobalamin (,VITAMIN B-12,) 1000 MCG/ML injection Inject 1,000 mcg into the muscle every 30 (thirty) days.      . hydrochlorothiazide (HYDRODIURIL) 25 MG tablet Take 1 tablet (25 mg total) by mouth daily. 90 tablet 3  . HYDROcodone-acetaminophen (VICODIN) 5-500 MG per tablet Take 1 tablet by mouth 2 (two) times daily as needed.      Marland Kitchen omeprazole (PRILOSEC) 20 MG capsule Take 20 mg by mouth 2 (two) times daily.     Marland Kitchen sulfaSALAzine (AZULFIDINE) 500 MG tablet Take 1,000 mg by mouth 2 (two) times daily.     No current facility-administered medications for this visit.    Objective: BP 94/60 mmHg  Pulse 60  Temp(Src) 98.1 F (36.7 C)  Wt 246 lb (111.585 kg) Gen: NAD, resting comfortably  L hand proximal to MCP joints with 4 cm x 3 mm  wound-mostly dried over but some open portions. No expanding erythema. No surrounding warmth.   Normal strength in hand  Intact distal sensation   Assessment/Plan:  Nonhealing wound on left hand I think continuous wet environment may be inhibiting healing. We will switch to a telfa cover without neosporin then gauze then wrapping and follow up in 2 weeks.   Poor wound healing promoted by prednisone. Patient wanted to use neosporin for nfection but we discussed warning signs for infection and there are none currently.  We also discussed if area does not heal, would consider doing some basic labs including CBC, LFTs, PT/PTT/INR due to history of easy skin opening but suspect this workup would likely be low yield. Could also consider wound care referral.   We also updated Td due to open wound.   Orders Placed This Encounter  Procedures  . Td vaccine greater than 7yo preservative free IM

## 2015-05-05 DIAGNOSIS — Z79899 Other long term (current) drug therapy: Secondary | ICD-10-CM | POA: Diagnosis not present

## 2015-05-05 DIAGNOSIS — M459 Ankylosing spondylitis of unspecified sites in spine: Secondary | ICD-10-CM | POA: Diagnosis not present

## 2015-05-05 DIAGNOSIS — M79641 Pain in right hand: Secondary | ICD-10-CM | POA: Diagnosis not present

## 2015-05-05 DIAGNOSIS — M25571 Pain in right ankle and joints of right foot: Secondary | ICD-10-CM | POA: Diagnosis not present

## 2015-05-26 DIAGNOSIS — Z111 Encounter for screening for respiratory tuberculosis: Secondary | ICD-10-CM | POA: Diagnosis not present

## 2015-08-07 DIAGNOSIS — M25579 Pain in unspecified ankle and joints of unspecified foot: Secondary | ICD-10-CM | POA: Diagnosis not present

## 2015-08-07 DIAGNOSIS — M170 Bilateral primary osteoarthritis of knee: Secondary | ICD-10-CM | POA: Diagnosis not present

## 2015-08-07 DIAGNOSIS — M459 Ankylosing spondylitis of unspecified sites in spine: Secondary | ICD-10-CM | POA: Diagnosis not present

## 2015-08-07 DIAGNOSIS — Z09 Encounter for follow-up examination after completed treatment for conditions other than malignant neoplasm: Secondary | ICD-10-CM | POA: Diagnosis not present

## 2015-08-07 DIAGNOSIS — M25539 Pain in unspecified wrist: Secondary | ICD-10-CM | POA: Diagnosis not present

## 2015-11-07 DIAGNOSIS — M47816 Spondylosis without myelopathy or radiculopathy, lumbar region: Secondary | ICD-10-CM | POA: Diagnosis not present

## 2015-11-07 DIAGNOSIS — M459 Ankylosing spondylitis of unspecified sites in spine: Secondary | ICD-10-CM | POA: Diagnosis not present

## 2015-11-07 DIAGNOSIS — M25571 Pain in right ankle and joints of right foot: Secondary | ICD-10-CM | POA: Diagnosis not present

## 2015-11-07 DIAGNOSIS — Z79899 Other long term (current) drug therapy: Secondary | ICD-10-CM | POA: Diagnosis not present

## 2016-03-05 DIAGNOSIS — Z6833 Body mass index (BMI) 33.0-33.9, adult: Secondary | ICD-10-CM | POA: Diagnosis not present

## 2016-03-05 DIAGNOSIS — R251 Tremor, unspecified: Secondary | ICD-10-CM | POA: Diagnosis not present

## 2016-03-05 DIAGNOSIS — T85192A Other mechanical complication of implanted electronic neurostimulator (electrode) of spinal cord, initial encounter: Secondary | ICD-10-CM | POA: Diagnosis not present

## 2016-03-11 DIAGNOSIS — M25551 Pain in right hip: Secondary | ICD-10-CM | POA: Diagnosis not present

## 2016-03-11 DIAGNOSIS — M5137 Other intervertebral disc degeneration, lumbosacral region: Secondary | ICD-10-CM | POA: Diagnosis not present

## 2016-03-20 DIAGNOSIS — Z01818 Encounter for other preprocedural examination: Secondary | ICD-10-CM | POA: Diagnosis not present

## 2016-03-20 DIAGNOSIS — T85192A Other mechanical complication of implanted electronic neurostimulator (electrode) of spinal cord, initial encounter: Secondary | ICD-10-CM | POA: Insufficient documentation

## 2016-03-20 DIAGNOSIS — T85192D Other mechanical complication of implanted electronic neurostimulator (electrode) of spinal cord, subsequent encounter: Secondary | ICD-10-CM | POA: Diagnosis not present

## 2016-03-26 DIAGNOSIS — R69 Illness, unspecified: Secondary | ICD-10-CM | POA: Diagnosis not present

## 2016-04-10 DIAGNOSIS — Z01818 Encounter for other preprocedural examination: Secondary | ICD-10-CM | POA: Diagnosis not present

## 2016-04-10 DIAGNOSIS — T85192D Other mechanical complication of implanted electronic neurostimulator (electrode) of spinal cord, subsequent encounter: Secondary | ICD-10-CM | POA: Diagnosis not present

## 2016-04-15 DIAGNOSIS — T85112A Breakdown (mechanical) of implanted electronic neurostimulator (electrode) of spinal cord, initial encounter: Secondary | ICD-10-CM | POA: Diagnosis not present

## 2016-04-15 DIAGNOSIS — Z87891 Personal history of nicotine dependence: Secondary | ICD-10-CM | POA: Diagnosis not present

## 2016-04-15 DIAGNOSIS — G894 Chronic pain syndrome: Secondary | ICD-10-CM | POA: Diagnosis present

## 2016-04-15 DIAGNOSIS — T85192A Other mechanical complication of implanted electronic neurostimulator (electrode) of spinal cord, initial encounter: Secondary | ICD-10-CM | POA: Diagnosis not present

## 2016-04-15 DIAGNOSIS — T85193A Other mechanical complication of implanted electronic neurostimulator, generator, initial encounter: Secondary | ICD-10-CM | POA: Diagnosis present

## 2016-04-15 DIAGNOSIS — I10 Essential (primary) hypertension: Secondary | ICD-10-CM | POA: Diagnosis present

## 2016-04-15 HISTORY — PX: SPINAL CORD STIMULATOR REMOVAL: SHX2423

## 2016-04-16 DIAGNOSIS — I10 Essential (primary) hypertension: Secondary | ICD-10-CM | POA: Diagnosis not present

## 2016-04-16 DIAGNOSIS — T85192A Other mechanical complication of implanted electronic neurostimulator (electrode) of spinal cord, initial encounter: Secondary | ICD-10-CM | POA: Diagnosis not present

## 2016-04-16 DIAGNOSIS — Z87891 Personal history of nicotine dependence: Secondary | ICD-10-CM | POA: Diagnosis not present

## 2016-04-16 DIAGNOSIS — G894 Chronic pain syndrome: Secondary | ICD-10-CM | POA: Diagnosis not present

## 2016-04-16 DIAGNOSIS — T85193A Other mechanical complication of implanted electronic neurostimulator, generator, initial encounter: Secondary | ICD-10-CM | POA: Diagnosis not present

## 2016-05-18 ENCOUNTER — Encounter (HOSPITAL_COMMUNITY): Payer: Self-pay | Admitting: Emergency Medicine

## 2016-05-18 ENCOUNTER — Emergency Department (HOSPITAL_COMMUNITY): Payer: Medicare Other

## 2016-05-18 ENCOUNTER — Observation Stay (HOSPITAL_COMMUNITY)
Admission: EM | Admit: 2016-05-18 | Discharge: 2016-05-19 | Disposition: A | Discharge status: Home / Self Care | Payer: Medicare Other | Attending: Internal Medicine | Admitting: Internal Medicine

## 2016-05-18 DIAGNOSIS — Z87891 Personal history of nicotine dependence: Secondary | ICD-10-CM | POA: Insufficient documentation

## 2016-05-18 DIAGNOSIS — R51 Headache: Secondary | ICD-10-CM | POA: Diagnosis not present

## 2016-05-18 DIAGNOSIS — J45909 Unspecified asthma, uncomplicated: Secondary | ICD-10-CM | POA: Diagnosis not present

## 2016-05-18 DIAGNOSIS — R42 Dizziness and giddiness: Secondary | ICD-10-CM | POA: Diagnosis not present

## 2016-05-18 DIAGNOSIS — R260 Ataxic gait: Secondary | ICD-10-CM

## 2016-05-18 DIAGNOSIS — R29818 Other symptoms and signs involving the nervous system: Secondary | ICD-10-CM

## 2016-05-18 DIAGNOSIS — R270 Ataxia, unspecified: Principal | ICD-10-CM | POA: Insufficient documentation

## 2016-05-18 DIAGNOSIS — I10 Essential (primary) hypertension: Secondary | ICD-10-CM | POA: Diagnosis not present

## 2016-05-18 DIAGNOSIS — I6523 Occlusion and stenosis of bilateral carotid arteries: Secondary | ICD-10-CM | POA: Diagnosis not present

## 2016-05-18 DIAGNOSIS — R2990 Unspecified symptoms and signs involving the nervous system: Secondary | ICD-10-CM

## 2016-05-18 DIAGNOSIS — H905 Unspecified sensorineural hearing loss: Secondary | ICD-10-CM | POA: Diagnosis not present

## 2016-05-18 DIAGNOSIS — R2681 Unsteadiness on feet: Secondary | ICD-10-CM

## 2016-05-18 DIAGNOSIS — H832X1 Labyrinthine dysfunction, right ear: Secondary | ICD-10-CM

## 2016-05-18 DIAGNOSIS — H538 Other visual disturbances: Secondary | ICD-10-CM | POA: Diagnosis present

## 2016-05-18 DIAGNOSIS — Z79899 Other long term (current) drug therapy: Secondary | ICD-10-CM | POA: Diagnosis not present

## 2016-05-18 DIAGNOSIS — H832X9 Labyrinthine dysfunction, unspecified ear: Secondary | ICD-10-CM | POA: Diagnosis not present

## 2016-05-18 DIAGNOSIS — H9193 Unspecified hearing loss, bilateral: Secondary | ICD-10-CM | POA: Diagnosis not present

## 2016-05-18 LAB — I-STAT CHEM 8, ED
BUN: 23 mg/dL — ABNORMAL HIGH (ref 6–20)
Calcium, Ion: 1.11 mmol/L — ABNORMAL LOW (ref 1.13–1.30)
Chloride: 105 mmol/L (ref 101–111)
Creatinine, Ser: 1.3 mg/dL — ABNORMAL HIGH (ref 0.61–1.24)
Glucose, Bld: 98 mg/dL (ref 65–99)
HCT: 47 % (ref 39.0–52.0)
Hemoglobin: 16 g/dL (ref 13.0–17.0)
Potassium: 3.8 mmol/L (ref 3.5–5.1)
Sodium: 143 mmol/L (ref 135–145)
TCO2: 26 mmol/L (ref 0–100)

## 2016-05-18 LAB — CBC
HCT: 44.5 % (ref 39.0–52.0)
Hemoglobin: 15 g/dL (ref 13.0–17.0)
MCH: 29.3 pg (ref 26.0–34.0)
MCHC: 33.7 g/dL (ref 30.0–36.0)
MCV: 86.9 fL (ref 78.0–100.0)
Platelets: 201 K/uL (ref 150–400)
RBC: 5.12 MIL/uL (ref 4.22–5.81)
RDW: 13.2 % (ref 11.5–15.5)
WBC: 7.6 K/uL (ref 4.0–10.5)

## 2016-05-18 LAB — LIPID PANEL
Cholesterol: 193 mg/dL (ref 0–200)
HDL: 43 mg/dL (ref >40)
LDL Cholesterol: 127 mg/dL — ABNORMAL HIGH (ref 0–99)
Total CHOL/HDL Ratio: 4.5 RATIO
Triglycerides: 114 mg/dL (ref <150)
VLDL: 23 mg/dL (ref 0–40)

## 2016-05-18 LAB — I-STAT TROPONIN, ED: Troponin i, poc: 0 ng/mL (ref 0.00–0.08)

## 2016-05-18 LAB — COMPREHENSIVE METABOLIC PANEL
ALT: 21 U/L (ref 17–63)
AST: 23 U/L (ref 15–41)
Albumin: 3.6 g/dL (ref 3.5–5.0)
Alkaline Phosphatase: 58 U/L (ref 38–126)
Anion gap: 5 (ref 5–15)
BUN: 21 mg/dL — ABNORMAL HIGH (ref 6–20)
CO2: 26 mmol/L (ref 22–32)
Calcium: 8.7 mg/dL — ABNORMAL LOW (ref 8.9–10.3)
Chloride: 107 mmol/L (ref 101–111)
Creatinine, Ser: 1.33 mg/dL — ABNORMAL HIGH (ref 0.61–1.24)
GFR calc Af Amer: >60 mL/min (ref >60)
GFR calc non Af Amer: 53 mL/min — ABNORMAL LOW (ref >60)
Glucose, Bld: 102 mg/dL — ABNORMAL HIGH (ref 65–99)
Potassium: 3.8 mmol/L (ref 3.5–5.1)
Sodium: 138 mmol/L (ref 135–145)
Total Bilirubin: 0.9 mg/dL (ref 0.3–1.2)
Total Protein: 6.3 g/dL — ABNORMAL LOW (ref 6.5–8.1)

## 2016-05-18 LAB — DIFFERENTIAL
Basophils Absolute: 0 K/uL (ref 0.0–0.1)
Basophils Relative: 0 %
Eosinophils Absolute: 0.1 K/uL (ref 0.0–0.7)
Eosinophils Relative: 1 %
Lymphocytes Relative: 28 %
Lymphs Abs: 2.1 K/uL (ref 0.7–4.0)
Monocytes Absolute: 0.9 K/uL (ref 0.1–1.0)
Monocytes Relative: 11 %
Neutro Abs: 4.5 K/uL (ref 1.7–7.7)
Neutrophils Relative %: 60 %

## 2016-05-18 LAB — HEPATIC FUNCTION PANEL
ALT: 20 U/L (ref 17–63)
AST: 29 U/L (ref 15–41)
Albumin: 3.5 g/dL (ref 3.5–5.0)
Alkaline Phosphatase: 59 U/L (ref 38–126)
Bilirubin, Direct: 0.4 mg/dL (ref 0.1–0.5)
Indirect Bilirubin: 1 mg/dL — ABNORMAL HIGH (ref 0.3–0.9)
Total Bilirubin: 1.4 mg/dL — ABNORMAL HIGH (ref 0.3–1.2)
Total Protein: 5.9 g/dL — ABNORMAL LOW (ref 6.5–8.1)

## 2016-05-18 LAB — PROTIME-INR
INR: 1.1 (ref 0.00–1.49)
Prothrombin Time: 14 s (ref 11.6–15.2)

## 2016-05-18 LAB — TSH: TSH: 1.316 u[IU]/mL (ref 0.350–4.500)

## 2016-05-18 LAB — RAPID URINE DRUG SCREEN, HOSP PERFORMED
Amphetamines: NOT DETECTED
Barbiturates: NOT DETECTED
Benzodiazepines: NOT DETECTED
Cocaine: NOT DETECTED
Opiates: NOT DETECTED
Tetrahydrocannabinol: NOT DETECTED

## 2016-05-18 LAB — GLUCOSE, CAPILLARY: Glucose-Capillary: 147 mg/dL — ABNORMAL HIGH (ref 65–99)

## 2016-05-18 LAB — CBG MONITORING, ED: Glucose-Capillary: 112 mg/dL — ABNORMAL HIGH (ref 65–99)

## 2016-05-18 LAB — APTT: aPTT: 26 s (ref 24–37)

## 2016-05-18 LAB — ETHANOL: Alcohol, Ethyl (B): <5 mg/dL

## 2016-05-18 MED ORDER — ENOXAPARIN SODIUM 30 MG/0.3ML ~~LOC~~ SOLN: 30.0000 mg | SUBCUTANEOUS | Status: DC

## 2016-05-18 MED ORDER — ASPIRIN EC 325 MG PO TBEC
325.0000 mg | DELAYED_RELEASE_TABLET | Freq: Every day | ORAL | Status: DC
  Administered 2016-05-18 – 2016-05-19 (×2): 325 mg via ORAL
  Filled 2016-05-18 (×2): qty 1

## 2016-05-18 MED ORDER — IOPAMIDOL (ISOVUE-370) INJECTION 76%: 100.0000 mL | Freq: Once | INTRAVENOUS | Status: DC | PRN

## 2016-05-18 MED ORDER — PANTOPRAZOLE SODIUM 40 MG PO TBEC
40.0000 mg | DELAYED_RELEASE_TABLET | Freq: Every day | ORAL | Status: DC
  Administered 2016-05-18: 40 mg via ORAL
  Filled 2016-05-18 (×3): qty 1

## 2016-05-18 MED ORDER — SODIUM CHLORIDE 0.9 % IV SOLN
INTRAVENOUS | Status: DC
  Administered 2016-05-18 – 2016-05-19 (×3): via INTRAVENOUS

## 2016-05-18 MED ORDER — SODIUM CHLORIDE 0.9 % IV SOLN
500.0000 mg | Freq: Once | INTRAVENOUS | Status: AC
  Administered 2016-05-18: 500 mg via INTRAVENOUS
  Filled 2016-05-18: qty 4

## 2016-05-18 MED ORDER — CARVEDILOL 12.5 MG PO TABS
12.5000 mg | ORAL_TABLET | Freq: Two times a day (BID) | ORAL | Status: DC
  Administered 2016-05-18 – 2016-05-19 (×2): 12.5 mg via ORAL
  Filled 2016-05-18 (×2): qty 1

## 2016-05-18 MED ORDER — CLONIDINE HCL 0.1 MG PO TABS
0.2000 mg | ORAL_TABLET | Freq: Every day | ORAL | Status: DC
  Administered 2016-05-18: 0.2 mg via ORAL
  Filled 2016-05-18: qty 2

## 2016-05-18 MED ORDER — ENOXAPARIN SODIUM 40 MG/0.4ML ~~LOC~~ SOLN
40.0000 mg | Freq: Every day | SUBCUTANEOUS | Status: DC
  Administered 2016-05-18: 40 mg via SUBCUTANEOUS
  Filled 2016-05-18: qty 0.4

## 2016-05-18 MED ORDER — AMLODIPINE BESYLATE 10 MG PO TABS
10.0000 mg | ORAL_TABLET | Freq: Every day | ORAL | Status: DC
  Administered 2016-05-18: 10 mg via ORAL
  Filled 2016-05-18 (×2): qty 1

## 2016-05-18 MED ORDER — PANTOPRAZOLE SODIUM 40 MG PO TBEC
40.0000 mg | DELAYED_RELEASE_TABLET | Freq: Every day | ORAL | Status: DC
  Administered 2016-05-18 – 2016-05-19 (×2): 40 mg via ORAL
  Filled 2016-05-18: qty 1

## 2016-05-18 NOTE — ED Notes (Signed)
Pt transported to MRI 

## 2016-05-18 NOTE — ED Notes (Addendum)
Pt return from MRI; speech at baseline/no slur at present time. Pt reports ONLY slight blurred vision and right hearing loss at present time.

## 2016-05-18 NOTE — ED Notes (Addendum)
[name] pt wife contacted via phone per pt request. [name] states pt symptoms "have been going on for a while" and pt "to be scheduled for an MRI 7 weeks after back stimulator removed which was removed 04/15/16." Prior to MRI reports right hip and knee replacement, denies other devices, or reason not to complete MRI. Post speaking with wife pt reports dizziness has been going on for a while but hearing loss and blurred vision new onset 0800 today.

## 2016-05-18 NOTE — ED Notes (Signed)
Pt dizzy with movement.

## 2016-05-18 NOTE — ED Provider Notes (Signed)
CSN: [id number]     Arrival date & time 05/18/16  [id number] History   First MD Initiated Contact with Patient 05/18/16 [id number]     Chief Complaint  Patient presents with  . Hearing Loss  . Blurred Vision     (Consider location/radiation/quality/duration/timing/severity/associated sxs/prior Treatment) HPI Comments: 70 year old male with history of hypertension presents for acute hearing loss, dizziness, blurry vision. The patient reports that he awoke at around 5:30 AM this morning and felt well. At 8 AM he developed a headache that has since resolved. Following the headache he noted bilateral hearing loss which is new. He then also noted feeling dizzy and feeling like his vision was blurred. Denies any injuries. No chest pain or palpitations. Denies any focal weakness.   Past Medical History  Diagnosis Date  . Extrinsic asthma, unspecified 04/07/2009  . HYPERTENSION 10/29/2007  . HYPOTENSION 08/13/2010  . LOW BACK PAIN 04/21/2008  . NEPHROLITHIASIS, HX OF 04/21/2008  . PUD, HX OF 04/21/2008   Past Surgical History  Procedure Laterality Date  . Bone cancer      rt femur removed and steel rod placed  . Prosatectomy     Family History  Problem Relation Age of Onset  . COPD Father   . Cancer Other     prostate   Social History  Substance Use Topics  . Smoking status: Former Smoker    Quit date: 12/30/1978  . Smokeless tobacco: Never Used  . Alcohol Use: No    Review of Systems  Constitutional: Negative for fever, chills, appetite change and fatigue.  HENT: Negative for congestion, postnasal drip, rhinorrhea, sinus pressure, tinnitus, trouble swallowing and voice change.   Eyes: Positive for visual disturbance (blurry vision).  Respiratory: Negative for cough, chest tightness and shortness of breath.   Cardiovascular: Negative for chest pain and palpitations.  Gastrointestinal: Negative for nausea, vomiting, abdominal pain and diarrhea.  Genitourinary: Negative for dysuria, urgency and  hematuria.  Musculoskeletal: Negative for myalgias and back pain.  Skin: Negative for rash.  Neurological: Positive for dizziness, tremors and headaches. Negative for seizures, facial asymmetry, speech difficulty, weakness and numbness.  Hematological: Does not bruise/bleed easily.      Allergies  Review of patient's allergies indicates no known allergies.  Home Medications   Prior to Admission medications   Medication Sig Start Date End Date Taking? Authorizing Provider  amLODipine (NORVASC) 10 MG tablet Take 10 mg by mouth daily.     Yes Historical Provider, MD  carvedilol (COREG) 25 MG tablet Take 25 mg by mouth 2 (two) times daily with a meal.     Yes Historical Provider, MD  Cholecalciferol (VITAMIN D) 2000 units CAPS Take 1 capsule by mouth daily.   Yes Historical Provider, MD  cloNIDine (CATAPRES) 0.2 MG tablet Take 0.2 mg by mouth at bedtime.    Yes Historical Provider, MD  omeprazole (PRILOSEC) 20 MG capsule Take 20 mg by mouth at bedtime.    Yes Historical Provider, MD  predniSONE (DELTASONE) 5 MG tablet Take 5 mg by mouth daily with breakfast.   Yes Historical Provider, MD  hydrochlorothiazide (HYDRODIURIL) 25 MG tablet Take 1 tablet (25 mg total) by mouth daily. 06/08/12 06/08/13  Dorena Cookey, MD   BP 152/75 mmHg  Pulse 60  Temp(Src) 97.3 F (36.3 C) (Oral)  Resp 18  Ht 5\' 11"  (1.803 m)  Wt 240 lb (108.863 kg)  BMI 33.49 kg/m2  SpO2 95% Physical Exam  Constitutional: He is oriented to person, place, and  time. He appears well-developed and well-nourished. No distress.  HENT:  Head: Normocephalic and atraumatic.  Right Ear: External ear normal.  Left Ear: External ear normal.  Mouth/Throat: Oropharynx is clear and moist. No oropharyngeal exudate.  Eyes: EOM are normal. Pupils are equal, round, and reactive to light.  Neck: Normal range of motion. Neck supple.  Cardiovascular: Normal rate, regular rhythm, normal heart sounds and intact distal pulses.   No murmur  heard. Pulmonary/Chest: Effort normal. No respiratory distress. He has no wheezes. He has no rales.  Abdominal: Soft. He exhibits no distension. There is no tenderness.  Musculoskeletal: He exhibits no edema.  Neurological: He is alert and oriented to person, place, and time. He has normal strength. No sensory deficit. Coordination and gait abnormal.  Patient was severe difficulty hearing on examination. Alert and oriented. Normal strength and sensation.  Patient with difficulty with finger to nose examination with apparent ataxia on the left. Unable to do heel to shin on exam. NIH stroke scale 2.   Skin: Skin is warm and dry. No rash noted. He is not diaphoretic.  Vitals reviewed.   ED Course  Procedures (including critical care time) Labs Review Labs Reviewed  COMPREHENSIVE METABOLIC PANEL - Abnormal; Notable for the following:    Glucose, Bld 102 (*)    BUN 21 (*)    Creatinine, Ser 1.33 (*)    Calcium 8.7 (*)    Total Protein 6.3 (*)    GFR calc non Af Amer 53 (*)    All other components within normal limits  HEPATIC FUNCTION PANEL - Abnormal; Notable for the following:    Total Protein 5.9 (*)    Total Bilirubin 1.4 (*)    Indirect Bilirubin 1.0 (*)    All other components within normal limits  CBG MONITORING, ED - Abnormal; Notable for the following:    Glucose-Capillary 112 (*)    All other components within normal limits  I-STAT CHEM 8, ED - Abnormal; Notable for the following:    BUN 23 (*)    Creatinine, Ser 1.30 (*)    Calcium, Ion 1.11 (*)    All other components within normal limits  PROTIME-INR  APTT  CBC  DIFFERENTIAL  URINE RAPID DRUG SCREEN, HOSP PERFORMED  ETHANOL  TSH  LIPID PANEL  HEMOGLOBIN A1C  I-STAT TROPOININ, ED    Imaging Review Ct Angio Head W/cm &/or Wo Cm  05/18/2016  CLINICAL DATA:  Sudden onset blurred vision and hearing loss. EXAM: CT ANGIOGRAPHY HEAD AND NECK TECHNIQUE: Multidetector CT imaging of the head and neck was performed using  the standard protocol during bolus administration of intravenous contrast. Multiplanar CT image reconstructions and MIPs were obtained to evaluate the vascular anatomy. Carotid stenosis measurements (when applicable) are obtained utilizing NASCET criteria, using the distal internal carotid diameter as the denominator. COMPARISON:  CT head without contrast 05/18/2016. CT of the cervical spine 12/22/2009 FINDINGS: CTA NECK Aortic arch: A 3 vessel arch configuration is present. There is no significant stenosis at the origins the great vessels. Right carotid system: The right common carotid artery is intact. The bifurcation is unremarkable. Minimal calcification is present at the carotid bifurcation without significant stenosis. Cervical right ICA is normal. Left carotid system: The left common carotid artery is within normal limits. Minimal calcifications are present at the left carotid bifurcation. Cervical left ICA is normal. Vertebral arteries:The a focal calcification is present at the origin of the left vertebral artery. Both vertebral arteries originate from the subclavian  arteries. There is mild narrowing of the left vertebral artery at the C2 level with a 50% stenosis. No significant narrowing is present on the right. Skeleton: Mild endplate degenerative changes are most evident at C6-7 with mild osseous foraminal narrowing bilaterally. No focal lytic or blastic lesions are present. Other neck: Multiple bilateral thyroid nodules are more prominent than in 2010. There is no significant adenopathy. No focal mucosal or submucosal lesions are present. The parotid and submandibular glands are normal bilaterally. Vocal cords are midline and symmetric. The lung apices are clear. CTA HEAD Anterior circulation: Minimal atherosclerotic change in slight atherosclerotic irregularity is present within the cavernous internal carotid arteries bilaterally. There is no significant stenosis within either internal carotid artery  through the ICA terminus. The A1 and M1 segments are normal. Anterior communicating artery is not discretely visualized. The MCA bifurcations are intact. There is mild attenuation of distal ACA MCA branch vessels bilaterally without a significant proximal stenosis or occlusion. Posterior circulation: Left vertebral artery is the dominant vessel. The PICA origins are visualized and normal. The basilar artery is normal. Both posterior cerebral arteries originate from the basilar tip. The PCA branch vessels demonstrate mild distal small vessel attenuation. Venous sinuses: The dural sinuses are patent. The right transverse sinus is patent. The straight sinus and deep cerebral veins are intact. Cortical veins are within normal limits. Anatomic variants: None. Delayed phase: The postcontrast images demonstrate no pathologic enhancement. Mild subcortical white matter disease is present bilaterally. IMPRESSION: 1. Minimal atherosclerotic calcifications at the carotid bifurcations bilaterally cavernous internal carotid arteries without significant stenosis. 2. Mild distal small vessel disease within the ACA and MCA branch vessels bilaterally without a significant proximal stenosis or occlusion. 3. Increased prominence of multiple bilateral thyroid nodules. Ultrasound of thyroid is recommended for further evaluation. 4. Mild subcortical white matter disease is evident bilaterally without evidence for acute infarct. Electronically Signed   By: San Morelle M.D.   On: 05/18/2016 11:29   Ct Head Wo Contrast  05/18/2016  ADDENDUM REPORT: 05/18/2016 10:35 ADDENDUM: These results were called by telephone at the time of interpretation on 05/18/2016 at 10:20 am to Dr. Lonia Skinner , who verbally acknowledged these results. Findings were discussed with Dr. Silverio Decamp at 10:24 am. Electronically Signed   By: Julian Hy M.D.   On: 05/18/2016 10:35  05/18/2016  CLINICAL DATA:  Code stroke, headache, hearing loss, blurred  vision, nausea, tremor EXAM: CT HEAD WITHOUT CONTRAST TECHNIQUE: Contiguous axial images were obtained from the base of the skull through the vertex without intravenous contrast. COMPARISON:  08/16/2010 FINDINGS: No evidence of parenchymal hemorrhage or extra-axial fluid collection. No mass lesion, mass effect, or midline shift. No CT evidence of acute infarction. Mild small vessel ischemic changes. Cerebral volume is within normal limits.  No ventriculomegaly. The visualized paranasal sinuses are essentially clear. The mastoid air cells are unopacified. No evidence of calvarial fracture. IMPRESSION: No evidence of acute intracranial abnormality. Mild small vessel ischemic changes. Electronically Signed: By: Julian Hy M.D. On: 05/18/2016 10:20   Ct Angio Neck W/cm &/or Wo/cm  05/18/2016  CLINICAL DATA:  Sudden onset blurred vision and hearing loss. EXAM: CT ANGIOGRAPHY HEAD AND NECK TECHNIQUE: Multidetector CT imaging of the head and neck was performed using the standard protocol during bolus administration of intravenous contrast. Multiplanar CT image reconstructions and MIPs were obtained to evaluate the vascular anatomy. Carotid stenosis measurements (when applicable) are obtained utilizing NASCET criteria, using the distal internal carotid diameter as the denominator. COMPARISON:  CT head without contrast 05/18/2016. CT of the cervical spine 12/22/2009 FINDINGS: CTA NECK Aortic arch: A 3 vessel arch configuration is present. There is no significant stenosis at the origins the great vessels. Right carotid system: The right common carotid artery is intact. The bifurcation is unremarkable. Minimal calcification is present at the carotid bifurcation without significant stenosis. Cervical right ICA is normal. Left carotid system: The left common carotid artery is within normal limits. Minimal calcifications are present at the left carotid bifurcation. Cervical left ICA is normal. Vertebral arteries:The a  focal calcification is present at the origin of the left vertebral artery. Both vertebral arteries originate from the subclavian arteries. There is mild narrowing of the left vertebral artery at the C2 level with a 50% stenosis. No significant narrowing is present on the right. Skeleton: Mild endplate degenerative changes are most evident at C6-7 with mild osseous foraminal narrowing bilaterally. No focal lytic or blastic lesions are present. Other neck: Multiple bilateral thyroid nodules are more prominent than in 2010. There is no significant adenopathy. No focal mucosal or submucosal lesions are present. The parotid and submandibular glands are normal bilaterally. Vocal cords are midline and symmetric. The lung apices are clear. CTA HEAD Anterior circulation: Minimal atherosclerotic change in slight atherosclerotic irregularity is present within the cavernous internal carotid arteries bilaterally. There is no significant stenosis within either internal carotid artery through the ICA terminus. The A1 and M1 segments are normal. Anterior communicating artery is not discretely visualized. The MCA bifurcations are intact. There is mild attenuation of distal ACA MCA branch vessels bilaterally without a significant proximal stenosis or occlusion. Posterior circulation: Left vertebral artery is the dominant vessel. The PICA origins are visualized and normal. The basilar artery is normal. Both posterior cerebral arteries originate from the basilar tip. The PCA branch vessels demonstrate mild distal small vessel attenuation. Venous sinuses: The dural sinuses are patent. The right transverse sinus is patent. The straight sinus and deep cerebral veins are intact. Cortical veins are within normal limits. Anatomic variants: None. Delayed phase: The postcontrast images demonstrate no pathologic enhancement. Mild subcortical white matter disease is present bilaterally. IMPRESSION: 1. Minimal atherosclerotic calcifications at  the carotid bifurcations bilaterally cavernous internal carotid arteries without significant stenosis. 2. Mild distal small vessel disease within the ACA and MCA branch vessels bilaterally without a significant proximal stenosis or occlusion. 3. Increased prominence of multiple bilateral thyroid nodules. Ultrasound of thyroid is recommended for further evaluation. 4. Mild subcortical white matter disease is evident bilaterally without evidence for acute infarct. Electronically Signed   By: San Morelle M.D.   On: 05/18/2016 11:29   Mr Brain Wo Contrast  05/18/2016  CLINICAL DATA:  Ataxic gait. Acute neurological deficit. Acute onset of headache at 8 o\'clock a.m. today. Symptoms have near completely resolved resolved. Dizzy with movement EXAM: MRI HEAD WITHOUT CONTRAST TECHNIQUE: Multiplanar, multiecho pulse sequences of the brain and surrounding structures were obtained without intravenous contrast. COMPARISON:  CT of the head without contrast in CTA head and neck from the same day. FINDINGS: The diffusion-weighted images demonstrate no evidence for acute or subacute infarction. The study is mildly degraded by patient motion. Mild age advanced periventricular and subcortical white matter disease is again seen bilaterally. The ventricles are proportionate to the degree of atrophy. No significant extra-axial fluid collection is present. The internal auditory canals are within normal limits bilaterally. The brainstem and cerebellum are unremarkable. Flow is present in the major intracranial arteries. Bilateral lens replacements are present. The  globes and orbits are otherwise intact. Left maxillary antrostomy and partial ethmoidectomy is noted. There may be a partial ethmoidectomy on the right as well. Mild residual mucosal thickening is present along the floor of both maxillary sinuses. The remaining paranasal sinuses and the mastoid air cells are clear. The skullbase is within normal limits. Midline  sagittal images are unremarkable. IMPRESSION: 1. No acute intracranial abnormality. 2. Moderate periventricular and subcortical white matter disease bilaterally is advanced for age. The finding is nonspecific but can be seen in the setting of chronic microvascular ischemia, a demyelinating process such as multiple sclerosis, vasculitis, complicated migraine headaches, or as the sequelae of a prior infectious or inflammatory process. 3. Minimal sinus disease. Electronically Signed   By: San Morelle M.D.   On: 05/18/2016 12:46   I have personally reviewed and evaluated these images and lab results as part of my medical decision-making.   EKG Interpretation   Date/Time:  Saturday May 18 2016 10:14:19 EDT Ventricular Rate:  63 PR Interval:  185 QRS Duration: 81 QT Interval:  393 QTC Calculation: 402 R Axis:   59 Text Interpretation:  Sinus rhythm Low voltage, extremity and precordial  leads Baseline wander in lead(s) V5 No significant change since last  tracing Confirmed by NGUYEN, EMILY ([id number]) on 05/18/2016 10:26:46 AM      MDM  Patient seen and evaluated at bedside. Ataxia noted on examination. In light of acute onset of symptoms acute stroke was paged. Patient was evaluated by Dr. Silverio Decamp at bedside.  CT and CTA were negative for acute process. MRI with some white matter or chronic ischemic changes but no acute finding. Per neurology they recommended the patient be admitted for PT/OT and steroid treatment for possible in her ear cause as patient ataxic with ambulation. This was discussed with Dr. Verlon Au who agreed with admission.  Patient admitted in stable condition. Final diagnoses:  Dizziness  Ataxia  Hearing loss, bilateral    1.  Ataxia  2. Dizziness  3. Hearing loss    Harvel Quale, MD 05/18/16 [phone]

## 2016-05-18 NOTE — ED Notes (Signed)
Nandigam at bedside.

## 2016-05-18 NOTE — ED Notes (Signed)
Per pt complaint of acute onset headache 0800; subsided at present time. Pt current complaint of hearing loss, blurred vision, nausea, and tremor.

## 2016-05-18 NOTE — ED Notes (Signed)
Bruce Ball at bedside.

## 2016-05-18 NOTE — Progress Notes (Signed)
Rx Brief note:  Lovenox  Rx adjusted Lovenox to 40mg  daily in pt with BMI>30  Thanks Dorrene German 05/18/2016 2:39 PM

## 2016-05-18 NOTE — H&P (Signed)
[facility] History and Physical  Bruce Ball TLX:[id number] DOB: [birth year] DOA: 05/18/2016  70 y/o ?  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 Estanislado Pandy 5 mm left mid zone lung nodule  Was sitting at the table this morning felt intense dizziness nausea and hearing loss and drove himself over to the emergency room. He was able to drive without event however ambulating to and from the car was difficult because of vertigo  No prior to admission illness, no fever, no chills, no sick contacts no dark stool no tarry stool no other specific issues other than mentioned above   Code stroke was called and the patient was evaluated emergently by neurology Initial NIH was 2 Workup was pursued which included MRI, CTA, CT scan results of which are below  BUN/creatinine baseline GFR around 50, today about same with slight acute component BUN elevated Total bili 1.4 Total protein 5.9 troponin 0.00 Hemoglobin 16, WBC 7.6  No known drug allergies  Father history COPD Also history of prostate cancer   Past Medical History  Diagnosis Date  . Extrinsic asthma, unspecified 04/07/2009  . HYPERTENSION 10/29/2007  . HYPOTENSION 08/13/2010  . LOW BACK PAIN 04/21/2008  . NEPHROLITHIASIS, HX OF 04/21/2008  . PUD, HX OF 04/21/2008   Past Surgical History  Procedure Laterality Date  . Bone cancer      rt femur removed and steel rod placed  . Prosatectomy     Social History:  Social History   Social History Narrative    No Known Allergies  Family History  Problem Relation Age of Onset  . COPD Father   . Cancer Other     prostate    Prior to Admission medications   Medication Sig Start Date End Date Taking? Authorizing Provider  amLODipine (NORVASC) 10 MG tablet Take 10 mg by  mouth daily.     Yes Historical Provider, MD  carvedilol (COREG) 25 MG tablet Take 25 mg by mouth 2 (two) times daily with a meal.     Yes Historical Provider, MD  Cholecalciferol (VITAMIN D) 2000 units CAPS Take 1 capsule by mouth daily.   Yes Historical Provider, MD  cloNIDine (CATAPRES) 0.2 MG tablet Take 0.2 mg by mouth at bedtime.    Yes Historical Provider, MD  omeprazole (PRILOSEC) 20 MG capsule Take 20 mg by mouth at bedtime.    Yes Historical Provider, MD  predniSONE (DELTASONE) 5 MG tablet Take 5 mg by mouth daily with breakfast.   Yes Historical Provider, MD  hydrochlorothiazide (HYDRODIURIL) 25 MG tablet Take 1 tablet (25 mg total) by mouth daily. 06/08/12 06/08/13  Dorena Cookey, MD   Physical Exam: Filed Vitals:   05/18/16 1115 05/18/16 1229 05/18/16 1300 05/18/16 1330  BP: 145/72 143/70 144/74 126/72  Pulse: 66 65 64 67  Temp:  98 F (36.7 C)    TempSrc:  Oral    Resp: '17 14 16 14  ' Height:      Weight:      SpO2: 98% 98% 97% 97%    On exam Slightly hard of hearing but pleasant Caucasian male Moving 4 extremities equally Smile is symmetric Uvula is midline Mallampati 4 External ocular movements seem intact however there is some tearing on the right eye with slight dilation >left-sided? Neck is soft and supple I'm not  able to appreciate any JVD no bruit S1-S2 regular rate rhythm Abdomen is soft nontender nondistended no rebound no guarding Sensory is intact in lower extremities Babinski's are downgoing Chest is clinically clear No specific joint swelling  Labs on Admission:  Basic Metabolic Panel:  Recent Labs Lab 05/18/16 1005 05/18/16 1014  NA 138 143  K 3.8 3.8  CL 107 105  CO2 26  --   GLUCOSE 102* 98  BUN 21* 23*  CREATININE 1.33* 1.30*  CALCIUM 8.7*  --    Liver Function Tests:  Recent Labs Lab 05/18/16 1005 05/18/16 1050  AST 23 29  ALT 21 20  ALKPHOS 58 59  BILITOT 0.9 1.4*  PROT 6.3* 5.9*  ALBUMIN 3.6 3.5   No results for input(s):  LIPASE, AMYLASE in the last 168 hours. No results for input(s): AMMONIA in the last 168 hours. CBC:  Recent Labs Lab 05/18/16 1005 05/18/16 1014  WBC 7.6  --   NEUTROABS 4.5  --   HGB 15.0 16.0  HCT 44.5 47.0  MCV 86.9  --   PLT 201  --    Cardiac Enzymes: No results for input(s): CKTOTAL, CKMB, CKMBINDEX, TROPONINI in the last 168 hours.  BNP (last 3 results) No results for input(s): BNP in the last 8760 hours.  ProBNP (last 3 results) No results for input(s): PROBNP in the last 8760 hours.  CBG:  Recent Labs Lab 05/18/16 1018  GLUCAP 112*    Radiological Exams on Admission: Ct Angio Head W/cm &/or Wo Cm  05/18/2016  CLINICAL DATA:  Sudden onset blurred vision and hearing loss. EXAM: CT ANGIOGRAPHY HEAD AND NECK TECHNIQUE: Multidetector CT imaging of the head and neck was performed using the standard protocol during bolus administration of intravenous contrast. Multiplanar CT image reconstructions and MIPs were obtained to evaluate the vascular anatomy. Carotid stenosis measurements (when applicable) are obtained utilizing NASCET criteria, using the distal internal carotid diameter as the denominator. COMPARISON:  CT head without contrast 05/18/2016. CT of the cervical spine 12/22/2009 FINDINGS: CTA NECK Aortic arch: A 3 vessel arch configuration is present. There is no significant stenosis at the origins the great vessels. Right carotid system: The right common carotid artery is intact. The bifurcation is unremarkable. Minimal calcification is present at the carotid bifurcation without significant stenosis. Cervical right ICA is normal. Left carotid system: The left common carotid artery is within normal limits. Minimal calcifications are present at the left carotid bifurcation. Cervical left ICA is normal. Vertebral arteries:The a focal calcification is present at the origin of the left vertebral artery. Both vertebral arteries originate from the subclavian arteries. There is  mild narrowing of the left vertebral artery at the C2 level with a 50% stenosis. No significant narrowing is present on the right. Skeleton: Mild endplate degenerative changes are most evident at C6-7 with mild osseous foraminal narrowing bilaterally. No focal lytic or blastic lesions are present. Other neck: Multiple bilateral thyroid nodules are more prominent than in 2010. There is no significant adenopathy. No focal mucosal or submucosal lesions are present. The parotid and submandibular glands are normal bilaterally. Vocal cords are midline and symmetric. The lung apices are clear. CTA HEAD Anterior circulation: Minimal atherosclerotic change in slight atherosclerotic irregularity is present within the cavernous internal carotid arteries bilaterally. There is no significant stenosis within either internal carotid artery through the ICA terminus. The A1 and M1 segments are normal. Anterior communicating artery is not discretely visualized. The MCA bifurcations are intact. There is mild  attenuation of distal ACA MCA branch vessels bilaterally without a significant proximal stenosis or occlusion. Posterior circulation: Left vertebral artery is the dominant vessel. The PICA origins are visualized and normal. The basilar artery is normal. Both posterior cerebral arteries originate from the basilar tip. The PCA branch vessels demonstrate mild distal small vessel attenuation. Venous sinuses: The dural sinuses are patent. The right transverse sinus is patent. The straight sinus and deep cerebral veins are intact. Cortical veins are within normal limits. Anatomic variants: None. Delayed phase: The postcontrast images demonstrate no pathologic enhancement. Mild subcortical white matter disease is present bilaterally. IMPRESSION: 1. Minimal atherosclerotic calcifications at the carotid bifurcations bilaterally cavernous internal carotid arteries without significant stenosis. 2. Mild distal small vessel disease within the  ACA and MCA branch vessels bilaterally without a significant proximal stenosis or occlusion. 3. Increased prominence of multiple bilateral thyroid nodules. Ultrasound of thyroid is recommended for further evaluation. 4. Mild subcortical white matter disease is evident bilaterally without evidence for acute infarct. Electronically Signed   By: San Morelle M.D.   On: 05/18/2016 11:29   Ct Head Wo Contrast  05/18/2016  ADDENDUM REPORT: 05/18/2016 10:35 ADDENDUM: These results were called by telephone at the time of interpretation on 05/18/2016 at 10:20 am to Dr. Lonia Skinner , who verbally acknowledged these results. Findings were discussed with Dr. Silverio Decamp at 10:24 am. Electronically Signed   By: Julian Hy M.D.   On: 05/18/2016 10:35  05/18/2016  CLINICAL DATA:  Code stroke, headache, hearing loss, blurred vision, nausea, tremor EXAM: CT HEAD WITHOUT CONTRAST TECHNIQUE: Contiguous axial images were obtained from the base of the skull through the vertex without intravenous contrast. COMPARISON:  08/16/2010 FINDINGS: No evidence of parenchymal hemorrhage or extra-axial fluid collection. No mass lesion, mass effect, or midline shift. No CT evidence of acute infarction. Mild small vessel ischemic changes. Cerebral volume is within normal limits.  No ventriculomegaly. The visualized paranasal sinuses are essentially clear. The mastoid air cells are unopacified. No evidence of calvarial fracture. IMPRESSION: No evidence of acute intracranial abnormality. Mild small vessel ischemic changes. Electronically Signed: By: Julian Hy M.D. On: 05/18/2016 10:20   Ct Angio Neck W/cm &/or Wo/cm  05/18/2016  CLINICAL DATA:  Sudden onset blurred vision and hearing loss. EXAM: CT ANGIOGRAPHY HEAD AND NECK TECHNIQUE: Multidetector CT imaging of the head and neck was performed using the standard protocol during bolus administration of intravenous contrast. Multiplanar CT image reconstructions and MIPs were  obtained to evaluate the vascular anatomy. Carotid stenosis measurements (when applicable) are obtained utilizing NASCET criteria, using the distal internal carotid diameter as the denominator. COMPARISON:  CT head without contrast 05/18/2016. CT of the cervical spine 12/22/2009 FINDINGS: CTA NECK Aortic arch: A 3 vessel arch configuration is present. There is no significant stenosis at the origins the great vessels. Right carotid system: The right common carotid artery is intact. The bifurcation is unremarkable. Minimal calcification is present at the carotid bifurcation without significant stenosis. Cervical right ICA is normal. Left carotid system: The left common carotid artery is within normal limits. Minimal calcifications are present at the left carotid bifurcation. Cervical left ICA is normal. Vertebral arteries:The a focal calcification is present at the origin of the left vertebral artery. Both vertebral arteries originate from the subclavian arteries. There is mild narrowing of the left vertebral artery at the C2 level with a 50% stenosis. No significant narrowing is present on the right. Skeleton: Mild endplate degenerative changes are most evident at C6-7 with  mild osseous foraminal narrowing bilaterally. No focal lytic or blastic lesions are present. Other neck: Multiple bilateral thyroid nodules are more prominent than in 2010. There is no significant adenopathy. No focal mucosal or submucosal lesions are present. The parotid and submandibular glands are normal bilaterally. Vocal cords are midline and symmetric. The lung apices are clear. CTA HEAD Anterior circulation: Minimal atherosclerotic change in slight atherosclerotic irregularity is present within the cavernous internal carotid arteries bilaterally. There is no significant stenosis within either internal carotid artery through the ICA terminus. The A1 and M1 segments are normal. Anterior communicating artery is not discretely visualized. The  MCA bifurcations are intact. There is mild attenuation of distal ACA MCA branch vessels bilaterally without a significant proximal stenosis or occlusion. Posterior circulation: Left vertebral artery is the dominant vessel. The PICA origins are visualized and normal. The basilar artery is normal. Both posterior cerebral arteries originate from the basilar tip. The PCA branch vessels demonstrate mild distal small vessel attenuation. Venous sinuses: The dural sinuses are patent. The right transverse sinus is patent. The straight sinus and deep cerebral veins are intact. Cortical veins are within normal limits. Anatomic variants: None. Delayed phase: The postcontrast images demonstrate no pathologic enhancement. Mild subcortical white matter disease is present bilaterally. IMPRESSION: 1. Minimal atherosclerotic calcifications at the carotid bifurcations bilaterally cavernous internal carotid arteries without significant stenosis. 2. Mild distal small vessel disease within the ACA and MCA branch vessels bilaterally without a significant proximal stenosis or occlusion. 3. Increased prominence of multiple bilateral thyroid nodules. Ultrasound of thyroid is recommended for further evaluation. 4. Mild subcortical white matter disease is evident bilaterally without evidence for acute infarct. Electronically Signed   By: San Morelle M.D.   On: 05/18/2016 11:29   Mr Brain Wo Contrast  05/18/2016  CLINICAL DATA:  Ataxic gait. Acute neurological deficit. Acute onset of headache at 8 o\'clock a.m. today. Symptoms have near completely resolved resolved. Dizzy with movement EXAM: MRI HEAD WITHOUT CONTRAST TECHNIQUE: Multiplanar, multiecho pulse sequences of the brain and surrounding structures were obtained without intravenous contrast. COMPARISON:  CT of the head without contrast in CTA head and neck from the same day. FINDINGS: The diffusion-weighted images demonstrate no evidence for acute or subacute infarction. The  study is mildly degraded by patient motion. Mild age advanced periventricular and subcortical white matter disease is again seen bilaterally. The ventricles are proportionate to the degree of atrophy. No significant extra-axial fluid collection is present. The internal auditory canals are within normal limits bilaterally. The brainstem and cerebellum are unremarkable. Flow is present in the major intracranial arteries. Bilateral lens replacements are present. The globes and orbits are otherwise intact. Left maxillary antrostomy and partial ethmoidectomy is noted. There may be a partial ethmoidectomy on the right as well. Mild residual mucosal thickening is present along the floor of both maxillary sinuses. The remaining paranasal sinuses and the mastoid air cells are clear. The skullbase is within normal limits. Midline sagittal images are unremarkable. IMPRESSION: 1. No acute intracranial abnormality. 2. Moderate periventricular and subcortical white matter disease bilaterally is advanced for age. The finding is nonspecific but can be seen in the setting of chronic microvascular ischemia, a demyelinating process such as multiple sclerosis, vasculitis, complicated migraine headaches, or as the sequelae of a prior infectious or inflammatory process. 3. Minimal sinus disease. Electronically Signed   By: San Morelle M.D.   On: 05/18/2016 12:46    EKG: Independently reviewed. nad  Assessment/Plan Active Problems:   Ataxia  Vestibular disequilibrium   1. Ataxia secondary to peripheral/ inner ear causes -We'll obtain Vestibularn rehabilitation -Agree with empiric steroids for labyrinthitis  Mild acute kidney injury -She'll be getting therefore try to obtain orthostatics but if patient very symptomatic would not do the same -Hold off HCTZ 25, continue amlodipine 10 daily, cut back Coreg to 12.5 twice a day continue clonidine 0.2 daily at bedtime  Lung nodule ?Left upper lobe granuloma Defer  to pulmonary as will need to be on disease modifying agent as per rheumatology  Ankylosing spondylitis -Continue to visit with rheumatology and determine next best step -Patient has had a spinal cord stimulator in the past as well which may cloud things  -Prior history chondrosarcoma -Surgical cure, unclear if any other workup necessarysultant consulted, please document name and whether formally or informally consulted   FullCode Admit to telemetry/MedSurg Expect one to two-day observation with IV fluids vestibular rehabilitation and slow taper steroids as determined by neurologist   Time spent: [place], [facility] Pager [phone]  If 7PM-7AM, please contact night-coverage [email or web address] Password [email or web address] 05/18/2016, 1:53 PM

## 2016-05-18 NOTE — Consult Note (Signed)
Requesting Physician: Dr.  Alfonse Spruce   Reason for consultation:  To   HPI:                                                                                                                                         Bruce Ball is an 70 y.o. male patient who  presented to the emergency room due to new onset of dizziness and gait instability. He started having the symptoms after about 8 AM this morning, started with a headache, blurred vision and dizziness symptoms associated with hearing loss which reportedly is new today. Denies any focal weakness in upper or lower extremities. No new sensory problems. No vision loss or visual field deficits. No family members available to corroborate her history.   Past Medical History: Past Medical History  Diagnosis Date  . Extrinsic asthma, unspecified 04/07/2009  . HYPERTENSION 10/29/2007  . HYPOTENSION 08/13/2010  . LOW BACK PAIN 04/21/2008  . NEPHROLITHIASIS, HX OF 04/21/2008  . PUD, HX OF 04/21/2008    Past Surgical History  Procedure Laterality Date  . Bone cancer      rt femur removed and steel rod placed  . Prosatectomy      Family History: Family History  Problem Relation Age of Onset  . COPD Father   . Cancer Other     prostate    Social History:   reports that he quit smoking about 37 years ago. He has never used smokeless tobacco. He reports that he does not drink alcohol or use illicit drugs.  Allergies:  No Known Allergies   Medications:                                                                                                                         Current facility-administered medications:  .  0.9 %  sodium chloride infusion, , Intravenous, Continuous, Bruce Colgate Fuller Mandril, MD, Last Rate: 50 mL/hr at 05/18/16 1049 .  aspirin EC tablet 325 mg, 325 mg, Oral, Daily, Bruce Monarch Fuller Mandril, MD, 325 mg at 05/18/16 1048 .  iopamidol (ISOVUE-370) 76 % injection 100 mL, 100 mL, Intravenous, Once PRN, Harvel Quale,  MD .  methylPREDNISolone sodium succinate (SOLU-MEDROL) 500 mg in sodium chloride 0.9 % 50 mL IVPB, 500 mg, Intravenous, Once, Bruce Ardizzone Fuller Mandril, MD .  pantoprazole (PROTONIX) EC tablet 40 mg, 40 mg, Oral,  Daily, Bruce Reiger Fuller Mandril, MD  Current outpatient prescriptions:  .  amLODipine (NORVASC) 10 MG tablet, Take 10 mg by mouth daily.  , Disp: , Rfl:  .  carvedilol (COREG) 25 MG tablet, Take 25 mg by mouth 2 (two) times daily with a meal.  , Disp: , Rfl:  .  Cholecalciferol (VITAMIN D) 2000 units CAPS, Take 1 capsule by mouth daily., Disp: , Rfl:  .  cloNIDine (CATAPRES) 0.2 MG tablet, Take 0.2 mg by mouth at bedtime. , Disp: , Rfl:  .  omeprazole (PRILOSEC) 20 MG capsule, Take 20 mg by mouth at bedtime. , Disp: , Rfl:  .  predniSONE (DELTASONE) 5 MG tablet, Take 5 mg by mouth daily with breakfast., Disp: , Rfl:  .  hydrochlorothiazide (HYDRODIURIL) 25 MG tablet, Take 1 tablet (25 mg total) by mouth daily., Disp: 90 tablet, Rfl: 3   ROS:                                                                                                                                       History obtained from the patient  General ROS: negative for - chills, fatigue, fever, night sweats, weight gain or weight loss Psychological ROS: negative for - behavioral disorder, hallucinations, memory difficulties, mood swings or suicidal ideation Ophthalmic ROS: negative for - blurry vision, double vision, eye pain or loss of vision ENT ROS: negative for - epistaxis, nasal discharge, oral lesions, sore throat, tinnitus or vertigo Allergy and Immunology ROS: negative for - hives or itchy/watery eyes Hematological and Lymphatic ROS: negative for - bleeding problems, bruising or swollen lymph nodes Endocrine ROS: negative for - galactorrhea, hair pattern changes, polydipsia/polyuria or temperature intolerance Respiratory ROS: negative for - cough, hemoptysis, shortness of breath or wheezing Cardiovascular  ROS: negative for - chest pain, dyspnea on exertion, edema or irregular heartbeat Gastrointestinal ROS: negative for - abdominal pain, diarrhea, hematemesis, nausea/vomiting or stool incontinence Genito-Urinary ROS: negative for - dysuria, hematuria, incontinence or urinary frequency/urgency Musculoskeletal ROS: negative for - joint swelling or muscular weakness Neurological ROS: as noted in HPI Dermatological ROS: negative for rash and skin lesion changes  Neurologic Examination:                                                                                                    Today's Vitals   05/18/16 0946 05/18/16 1000 05/18/16 1049  BP: 142/76 148/80 146/70  Pulse: 64 72 71  Temp: 98.4 F (36.9 C)    TempSrc:  Oral    Resp: 14 15 17   Height: 5\' 11"  (1.803 m)    Weight: 108.863 kg (240 lb)    SpO2: 97% 98% 99%    Evaluation of higher integrative functions including: Level of alertness: Alert, Flat affect Oriented to time, place and person Speech: fluent, no evidence of dysarthria or aphasia noted.  Test the following cranial nerves: 2-12 grossly intact Motor examination: Normal tone, bulk, full 5/5 motor strength in all 4 extremities Examination of sensation : symmetric sensation to pinprick in all 4 extremities Examination of deep tendon reflexes: 2+,symmetric in all extremities, normal plantars bilaterally Test coordination: Normal finger nose testing, with no evidence of limb appendicular ataxia or abnormal involuntary movements or tremors noted.  Gait: Moderate gait instability  Lab Results: Basic Metabolic Panel:  Recent Labs Lab 05/18/16 1005 05/18/16 1014  NA 138 143  K 3.8 3.8  CL 107 105  CO2 26  --   GLUCOSE 102* 98  BUN 21* 23*  CREATININE 1.33* 1.30*  CALCIUM 8.7*  --     Liver Function Tests:  Recent Labs Lab 05/18/16 1005  AST 23  ALT 21  ALKPHOS 58  BILITOT 0.9  PROT 6.3*  ALBUMIN 3.6   No results for input(s): LIPASE, AMYLASE in the last  168 hours. No results for input(s): AMMONIA in the last 168 hours.  CBC:  Recent Labs Lab 05/18/16 1005 05/18/16 1014  WBC 7.6  --   NEUTROABS 4.5  --   HGB 15.0 16.0  HCT 44.5 47.0  MCV 86.9  --   PLT 201  --     Cardiac Enzymes: No results for input(s): CKTOTAL, CKMB, CKMBINDEX, TROPONINI in the last 168 hours.  Lipid Panel: No results for input(s): CHOL, TRIG, HDL, CHOLHDL, VLDL, LDLCALC in the last 168 hours.  CBG:  Recent Labs Lab 05/18/16 1018  GLUCAP 112*    Microbiology: Results for orders placed or performed during the hospital encounter of 03/05/10  Urine culture     Status: None   Collection Time: 03/05/10 10:52 PM  Result Value Ref Range Status   Specimen Description URINE, RANDOM  Final   Special Requests NONE  Final   Colony Count NO GROWTH  Final   Culture NO GROWTH  Final   Report Status 03/07/2010 FINAL  Final     Imaging: Ct Head Wo Contrast  05/18/2016  ADDENDUM REPORT: 05/18/2016 10:35 ADDENDUM: These results were called by telephone at the time of interpretation on 05/18/2016 at 10:20 am to Dr. Lonia Skinner , who verbally acknowledged these results. Findings were discussed with Dr. Silverio Decamp at 10:24 am. Electronically Signed   By: Julian Hy M.D.   On: 05/18/2016 10:35  05/18/2016  CLINICAL DATA:  Code stroke, headache, hearing loss, blurred vision, nausea, tremor EXAM: CT HEAD WITHOUT CONTRAST TECHNIQUE: Contiguous axial images were obtained from the base of the skull through the vertex without intravenous contrast. COMPARISON:  08/16/2010 FINDINGS: No evidence of parenchymal hemorrhage or extra-axial fluid collection. No mass lesion, mass effect, or midline shift. No CT evidence of acute infarction. Mild small vessel ischemic changes. Cerebral volume is within normal limits.  No ventriculomegaly. The visualized paranasal sinuses are essentially clear. The mastoid air cells are unopacified. No evidence of calvarial fracture. IMPRESSION: No  evidence of acute intracranial abnormality. Mild small vessel ischemic changes. Electronically Signed: By: Julian Hy M.D. On: 05/18/2016 10:20     Stroke Scales   NIHSS :  0  Assessment and  plan:   LEUL LUTY is an 70 y.o. male patient who presented with dizziness and gait instability symptoms started this morning. Otherwise no focal deficits on exam. A CTA of the head and neck were performed in the ER in addition to initial CT scan of the head which are all negative for any acute pathology. Subsequently an MRI of the brain was performed which did not show evidence of acute stroke.  I suspect his symptoms are secondary to acute vestibular neuropathy, commonly seen due to possible viral infections. Ordered one dose of IV Solu-Medrol 500 mg in the ER. Given the severity of gait instability, he is at high falls risk. Recommend overnight observation to the hospitalist service and physical therapy in the morning prior to discharge to ascertain safety. No further neurodiagnostic workup is recommended. Will sign off.  Discussed with ER physician, Dr. Alfonse Spruce.

## 2016-05-18 NOTE — ED Notes (Signed)
Pt transported to CT ?

## 2016-05-19 DIAGNOSIS — H832X9 Labyrinthine dysfunction, unspecified ear: Secondary | ICD-10-CM | POA: Diagnosis not present

## 2016-05-19 DIAGNOSIS — R270 Ataxia, unspecified: Secondary | ICD-10-CM | POA: Diagnosis not present

## 2016-05-19 LAB — COMPREHENSIVE METABOLIC PANEL
ALT: 19 U/L (ref 17–63)
AST: 18 U/L (ref 15–41)
Albumin: 3.3 g/dL — ABNORMAL LOW (ref 3.5–5.0)
Alkaline Phosphatase: 55 U/L (ref 38–126)
Anion gap: 5 (ref 5–15)
BUN: 20 mg/dL (ref 6–20)
CO2: 26 mmol/L (ref 22–32)
Calcium: 8.7 mg/dL — ABNORMAL LOW (ref 8.9–10.3)
Chloride: 109 mmol/L (ref 101–111)
Creatinine, Ser: 1.39 mg/dL — ABNORMAL HIGH (ref 0.61–1.24)
GFR calc Af Amer: 58 mL/min — ABNORMAL LOW (ref >60)
GFR calc non Af Amer: 50 mL/min — ABNORMAL LOW (ref >60)
Glucose, Bld: 152 mg/dL — ABNORMAL HIGH (ref 65–99)
Potassium: 4.4 mmol/L (ref 3.5–5.1)
Sodium: 140 mmol/L (ref 135–145)
Total Bilirubin: 0.9 mg/dL (ref 0.3–1.2)
Total Protein: 6.4 g/dL — ABNORMAL LOW (ref 6.5–8.1)

## 2016-05-19 LAB — CBC
HCT: 44.7 % (ref 39.0–52.0)
Hemoglobin: 15 g/dL (ref 13.0–17.0)
MCH: 29 pg (ref 26.0–34.0)
MCHC: 33.6 g/dL (ref 30.0–36.0)
MCV: 86.3 fL (ref 78.0–100.0)
Platelets: 207 10*3/uL (ref 150–400)
RBC: 5.18 MIL/uL (ref 4.22–5.81)
RDW: 13.1 % (ref 11.5–15.5)
WBC: 9.6 10*3/uL (ref 4.0–10.5)

## 2016-05-19 MED ORDER — PREDNISONE 10 MG PO TABS: ORAL_TABLET | ORAL | Status: DC

## 2016-05-19 MED ORDER — MECLIZINE HCL 25 MG PO TABS
25.0000 mg | ORAL_TABLET | Freq: Three times a day (TID) | ORAL | Status: DC | PRN
Start: 2016-05-19 — End: 2016-06-26

## 2016-05-19 MED ORDER — PREDNISONE 5 MG PO TABS: 5.0000 mg | ORAL_TABLET | Freq: Every day | ORAL | Status: DC

## 2016-05-19 MED ORDER — NONFORMULARY OR COMPOUNDED ITEM: Status: DC

## 2016-05-19 MED ORDER — MECLIZINE HCL 25 MG PO TABS
25.0000 mg | ORAL_TABLET | Freq: Three times a day (TID) | ORAL | Status: DC | PRN
  Filled 2016-05-19: qty 1

## 2016-05-19 NOTE — Plan of Care (Signed)
Problem: Safety: Goal: Ability to remain free from injury will improve Outcome: Progressing Pt eval and vestibular rehabilitation today.

## 2016-05-19 NOTE — Progress Notes (Signed)
OT Cancellation Note  Patient Details Name: Bruce Ball MRN: [id number] DOB: 1946-07-17   Cancelled Treatment:    Reason Eval/Treat Not Completed: OT screened, no needs identified, will sign off. Pt states no OT needs at this time. He reports having no concerns with being able to get up and transfer on and off commode or perform bath/dress. Wife present and states she feels comfortable assisting pt if needed at d/c also. Will sign off per pt request.  Jules Schick  [id number] 05/19/2016, 10:58 AM

## 2016-05-19 NOTE — Discharge Summary (Signed)
PATIENT DETAILS Name: Bruce Ball Age: 70 y.o. Sex: male Date of Birth: 02/19/1946 MRN: [id number]. Admitting Physician: Nita Sells, MD [id number] ALLEN, MD  Admit Date: 05/18/2016 Discharge date: 05/19/2016  Recommendations for Outpatient Follow-up:  1. Ensure follow-up with ENT cardiologist in the next few days if hearing loss persists. 2. Increased prominence of multiple bilateral thyroid nodules-Ultrasound of thyroid is recommended for further evaluation to be done as outpatient  PRIMARY DISCHARGE DIAGNOSIS:  Active Problems:   Ataxia   Vestibular disequilibrium      PAST MEDICAL HISTORY: Past Medical History  Diagnosis Date  . Extrinsic asthma, unspecified 04/07/2009  . HYPERTENSION 10/29/2007  . HYPOTENSION 08/13/2010  . LOW BACK PAIN 04/21/2008  . NEPHROLITHIASIS, HX OF 04/21/2008  . PUD, HX OF 04/21/2008    DISCHARGE MEDICATIONS: Current Discharge Medication List    START taking these medications   Details  meclizine (ANTIVERT) 25 MG tablet Take 1 tablet (25 mg total) by mouth 3 (three) times daily as needed for dizziness. Qty: 30 tablet, Refills: 0    NONFORMULARY OR COMPOUNDED ITEM Please evaluate and treat for vestibular PT Diagnoses:acute labrynthitis Qty: 1 each, Refills: 0      CONTINUE these medications which have CHANGED   Details  !! predniSONE (DELTASONE) 10 MG tablet Take 6 tablets (60 mg) daily for 5 days, then, Take 4 tablets (40 mg) daily for 1 day, then, Take 3 tablets (30 mg) daily for 1 day, then, Take 2 tablets (20 mg) daily for 1 day, then, Take 1 tablets (10 mg) daily for 1 day, then, Then resume usual dosing of 0.5 tablets (5 mg) Qty: 40 tablet, Refills: 0    !! predniSONE (DELTASONE) 5 MG tablet Take 1 tablet (5 mg total) by mouth daily with breakfast. Resume once you have completed a taper of prednisone as instructed     !! - Potential duplicate medications found. Please discuss with provider.    CONTINUE these  medications which have NOT CHANGED   Details  amLODipine (NORVASC) 10 MG tablet Take 10 mg by mouth daily.      carvedilol (COREG) 25 MG tablet Take 25 mg by mouth 2 (two) times daily with a meal.      Cholecalciferol (VITAMIN D) 2000 units CAPS Take 1 capsule by mouth daily.    cloNIDine (CATAPRES) 0.2 MG tablet Take 0.2 mg by mouth at bedtime.     omeprazole (PRILOSEC) 20 MG capsule Take 20 mg by mouth at bedtime.       STOP taking these medications     hydrochlorothiazide (HYDRODIURIL) 25 MG tablet         ALLERGIES:  No Known Allergies  BRIEF HPI:  See H&P, Labs, Consult and Test reports for all details in brief, patientIs a 70 year old male with history of ankylosing spondylitis on chronic prednisone who presented with acute vertigo.  CONSULTATIONS:   neurology  PERTINENT RADIOLOGIC STUDIES: Ct Angio Head W/cm &/or Wo Cm  05/18/2016  CLINICAL DATA:  Sudden onset blurred vision and hearing loss. EXAM: CT ANGIOGRAPHY HEAD AND NECK TECHNIQUE: Multidetector CT imaging of the head and neck was performed using the standard protocol during bolus administration of intravenous contrast. Multiplanar CT image reconstructions and MIPs were obtained to evaluate the vascular anatomy. Carotid stenosis measurements (when applicable) are obtained utilizing NASCET criteria, using the distal internal carotid diameter as the denominator. COMPARISON:  CT head without contrast 05/18/2016. CT of the cervical spine 12/22/2009 FINDINGS: CTA NECK Aortic arch: A 3  vessel arch configuration is present. There is no significant stenosis at the origins the great vessels. Right carotid system: The right common carotid artery is intact. The bifurcation is unremarkable. Minimal calcification is present at the carotid bifurcation without significant stenosis. Cervical right ICA is normal. Left carotid system: The left common carotid artery is within normal limits. Minimal calcifications are present at the left  carotid bifurcation. Cervical left ICA is normal. Vertebral arteries:The a focal calcification is present at the origin of the left vertebral artery. Both vertebral arteries originate from the subclavian arteries. There is mild narrowing of the left vertebral artery at the C2 level with a 50% stenosis. No significant narrowing is present on the right. Skeleton: Mild endplate degenerative changes are most evident at C6-7 with mild osseous foraminal narrowing bilaterally. No focal lytic or blastic lesions are present. Other neck: Multiple bilateral thyroid nodules are more prominent than in 2010. There is no significant adenopathy. No focal mucosal or submucosal lesions are present. The parotid and submandibular glands are normal bilaterally. Vocal cords are midline and symmetric. The lung apices are clear. CTA HEAD Anterior circulation: Minimal atherosclerotic change in slight atherosclerotic irregularity is present within the cavernous internal carotid arteries bilaterally. There is no significant stenosis within either internal carotid artery through the ICA terminus. The A1 and M1 segments are normal. Anterior communicating artery is not discretely visualized. The MCA bifurcations are intact. There is mild attenuation of distal ACA MCA branch vessels bilaterally without a significant proximal stenosis or occlusion. Posterior circulation: Left vertebral artery is the dominant vessel. The PICA origins are visualized and normal. The basilar artery is normal. Both posterior cerebral arteries originate from the basilar tip. The PCA branch vessels demonstrate mild distal small vessel attenuation. Venous sinuses: The dural sinuses are patent. The right transverse sinus is patent. The straight sinus and deep cerebral veins are intact. Cortical veins are within normal limits. Anatomic variants: None. Delayed phase: The postcontrast images demonstrate no pathologic enhancement. Mild subcortical white matter disease is  present bilaterally. IMPRESSION: 1. Minimal atherosclerotic calcifications at the carotid bifurcations bilaterally cavernous internal carotid arteries without significant stenosis. 2. Mild distal small vessel disease within the ACA and MCA branch vessels bilaterally without a significant proximal stenosis or occlusion. 3. Increased prominence of multiple bilateral thyroid nodules. Ultrasound of thyroid is recommended for further evaluation. 4. Mild subcortical white matter disease is evident bilaterally without evidence for acute infarct. Electronically Signed   By: San Morelle M.D.   On: 05/18/2016 11:29   Ct Head Wo Contrast  05/18/2016  ADDENDUM REPORT: 05/18/2016 10:35 ADDENDUM: These results were called by telephone at the time of interpretation on 05/18/2016 at 10:20 am to Dr. Lonia Skinner , who verbally acknowledged these results. Findings were discussed with Dr. Silverio Decamp at 10:24 am. Electronically Signed   By: Julian Hy M.D.   On: 05/18/2016 10:35  05/18/2016  CLINICAL DATA:  Code stroke, headache, hearing loss, blurred vision, nausea, tremor EXAM: CT HEAD WITHOUT CONTRAST TECHNIQUE: Contiguous axial images were obtained from the base of the skull through the vertex without intravenous contrast. COMPARISON:  08/16/2010 FINDINGS: No evidence of parenchymal hemorrhage or extra-axial fluid collection. No mass lesion, mass effect, or midline shift. No CT evidence of acute infarction. Mild small vessel ischemic changes. Cerebral volume is within normal limits.  No ventriculomegaly. The visualized paranasal sinuses are essentially clear. The mastoid air cells are unopacified. No evidence of calvarial fracture. IMPRESSION: No evidence of acute intracranial abnormality. Mild small  vessel ischemic changes. Electronically Signed: By: Julian Hy M.D. On: 05/18/2016 10:20   Ct Angio Neck W/cm &/or Wo/cm  05/18/2016  CLINICAL DATA:  Sudden onset blurred vision and hearing loss. EXAM: CT  ANGIOGRAPHY HEAD AND NECK TECHNIQUE: Multidetector CT imaging of the head and neck was performed using the standard protocol during bolus administration of intravenous contrast. Multiplanar CT image reconstructions and MIPs were obtained to evaluate the vascular anatomy. Carotid stenosis measurements (when applicable) are obtained utilizing NASCET criteria, using the distal internal carotid diameter as the denominator. COMPARISON:  CT head without contrast 05/18/2016. CT of the cervical spine 12/22/2009 FINDINGS: CTA NECK Aortic arch: A 3 vessel arch configuration is present. There is no significant stenosis at the origins the great vessels. Right carotid system: The right common carotid artery is intact. The bifurcation is unremarkable. Minimal calcification is present at the carotid bifurcation without significant stenosis. Cervical right ICA is normal. Left carotid system: The left common carotid artery is within normal limits. Minimal calcifications are present at the left carotid bifurcation. Cervical left ICA is normal. Vertebral arteries:The a focal calcification is present at the origin of the left vertebral artery. Both vertebral arteries originate from the subclavian arteries. There is mild narrowing of the left vertebral artery at the C2 level with a 50% stenosis. No significant narrowing is present on the right. Skeleton: Mild endplate degenerative changes are most evident at C6-7 with mild osseous foraminal narrowing bilaterally. No focal lytic or blastic lesions are present. Other neck: Multiple bilateral thyroid nodules are more prominent than in 2010. There is no significant adenopathy. No focal mucosal or submucosal lesions are present. The parotid and submandibular glands are normal bilaterally. Vocal cords are midline and symmetric. The lung apices are clear. CTA HEAD Anterior circulation: Minimal atherosclerotic change in slight atherosclerotic irregularity is present within the cavernous  internal carotid arteries bilaterally. There is no significant stenosis within either internal carotid artery through the ICA terminus. The A1 and M1 segments are normal. Anterior communicating artery is not discretely visualized. The MCA bifurcations are intact. There is mild attenuation of distal ACA MCA branch vessels bilaterally without a significant proximal stenosis or occlusion. Posterior circulation: Left vertebral artery is the dominant vessel. The PICA origins are visualized and normal. The basilar artery is normal. Both posterior cerebral arteries originate from the basilar tip. The PCA branch vessels demonstrate mild distal small vessel attenuation. Venous sinuses: The dural sinuses are patent. The right transverse sinus is patent. The straight sinus and deep cerebral veins are intact. Cortical veins are within normal limits. Anatomic variants: None. Delayed phase: The postcontrast images demonstrate no pathologic enhancement. Mild subcortical white matter disease is present bilaterally. IMPRESSION: 1. Minimal atherosclerotic calcifications at the carotid bifurcations bilaterally cavernous internal carotid arteries without significant stenosis. 2. Mild distal small vessel disease within the ACA and MCA branch vessels bilaterally without a significant proximal stenosis or occlusion. 3. Increased prominence of multiple bilateral thyroid nodules. Ultrasound of thyroid is recommended for further evaluation. 4. Mild subcortical white matter disease is evident bilaterally without evidence for acute infarct. Electronically Signed   By: San Morelle M.D.   On: 05/18/2016 11:29   Mr Brain Wo Contrast  05/18/2016  CLINICAL DATA:  Ataxic gait. Acute neurological deficit. Acute onset of headache at 8 o\'clock a.m. today. Symptoms have near completely resolved resolved. Dizzy with movement EXAM: MRI HEAD WITHOUT CONTRAST TECHNIQUE: Multiplanar, multiecho pulse sequences of the brain and surrounding  structures were obtained without intravenous  contrast. COMPARISON:  CT of the head without contrast in CTA head and neck from the same day. FINDINGS: The diffusion-weighted images demonstrate no evidence for acute or subacute infarction. The study is mildly degraded by patient motion. Mild age advanced periventricular and subcortical white matter disease is again seen bilaterally. The ventricles are proportionate to the degree of atrophy. No significant extra-axial fluid collection is present. The internal auditory canals are within normal limits bilaterally. The brainstem and cerebellum are unremarkable. Flow is present in the major intracranial arteries. Bilateral lens replacements are present. The globes and orbits are otherwise intact. Left maxillary antrostomy and partial ethmoidectomy is noted. There may be a partial ethmoidectomy on the right as well. Mild residual mucosal thickening is present along the floor of both maxillary sinuses. The remaining paranasal sinuses and the mastoid air cells are clear. The skullbase is within normal limits. Midline sagittal images are unremarkable. IMPRESSION: 1. No acute intracranial abnormality. 2. Moderate periventricular and subcortical white matter disease bilaterally is advanced for age. The finding is nonspecific but can be seen in the setting of chronic microvascular ischemia, a demyelinating process such as multiple sclerosis, vasculitis, complicated migraine headaches, or as the sequelae of a prior infectious or inflammatory process. 3. Minimal sinus disease. Electronically Signed   By: San Morelle M.D.   On: 05/18/2016 12:46     PERTINENT LAB RESULTS: CBC:  Recent Labs  05/18/16 1005 05/18/16 1014 05/19/16 0403  WBC 7.6  --  9.6  HGB 15.0 16.0 15.0  HCT 44.5 47.0 44.7  PLT 201  --  207   CMET CMP     Component Value Date/Time   NA 140 05/19/2016 0403   K 4.4 05/19/2016 0403   CL 109 05/19/2016 0403   CO2 26 05/19/2016 0403    GLUCOSE 152* 05/19/2016 0403   BUN 20 05/19/2016 0403   CREATININE 1.39* 05/19/2016 0403   CALCIUM 8.7* 05/19/2016 0403   PROT 6.4* 05/19/2016 0403   ALBUMIN 3.3* 05/19/2016 0403   AST 18 05/19/2016 0403   ALT 19 05/19/2016 0403   ALKPHOS 55 05/19/2016 0403   BILITOT 0.9 05/19/2016 0403   GFRNONAA 50* 05/19/2016 0403   GFRAA 58* 05/19/2016 0403    GFR Estimated Creatinine Clearance: 62.9 mL/min (by C-G formula based on Cr of 1.39). No results for input(s): LIPASE, AMYLASE in the last 72 hours. No results for input(s): CKTOTAL, CKMB, CKMBINDEX, TROPONINI in the last 72 hours. Invalid input(s): POCBNP No results for input(s): DDIMER in the last 72 hours. No results for input(s): HGBA1C in the last 72 hours.  Recent Labs  05/18/16 1059  CHOL 193  HDL 43  LDLCALC 127*  TRIG 114  CHOLHDL 4.5    Recent Labs  05/18/16 1050  TSH 1.316   No results for input(s): VITAMINB12, FOLATE, FERRITIN, TIBC, IRON, RETICCTPCT in the last 72 hours. Coags:  Recent Labs  05/18/16 1005  INR 1.10   Microbiology: No results found for this or any previous visit (from the past 240 hour(s)).   BRIEF HOSPITAL COURSE:  Acute vertigo: Likely secondary to acute labyrinthitis given hearing loss. Vertigo has resolved, patient has steady gait on exam this morning. He still has some hearing loss bilaterally but mostly on his left ear. He was treated with IV steroids, will discharge him on tapering steroids. He has been asked to follow up with his primary care practitioner, and also with an ENT/audiologist of his choice if his hearing loss still persists. He has  also been asked to follow up with outpatient PT.  Note-he has bilateral chronic hearing loss at baseline and uses hearing aids.  Rest of his medical issues were stable during his short hospital stay.  TODAY-DAY OF DISCHARGE:  Subjective:   [place] today has no headache,no chest abdominal pain,no new weakness tingling or numbness,  feels much better wants to go home today.   Objective:   Blood pressure 119/51, pulse 79, temperature 98 F (36.7 C), temperature source Oral, resp. rate 16, height 5\' 11"  (1.803 m), weight 108.863 kg (240 lb), SpO2 98 %.  Intake/Output Summary (Last 24 hours) at 05/19/16 1134 Last data filed at 05/19/16 0510  Gross per 24 hour  Intake    840 ml  Output   1050 ml  Net   -210 ml   Filed Weights   05/18/16 0946 05/18/16 1440  Weight: 108.863 kg (240 lb) 108.863 kg (240 lb)    Exam Awake Alert, Oriented *3, No new F.N deficits, Normal affect [place].AT,PERRAL Supple Neck,No JVD, No cervical lymphadenopathy appriciated.  Symmetrical Chest wall movement, Good air movement bilaterally, CTAB RRR,No Gallops,Rubs or new Murmurs, No Parasternal Heave +ve B.Sounds, Abd Soft, Non tender, No organomegaly appriciated, No rebound -guarding or rigidity. No Cyanosis, Clubbing or edema, No new Rash or bruise  DISCHARGE CONDITION: Stable  DISPOSITION: Home with outpatient vestibular PT  DISCHARGE INSTRUCTIONS:    Activity:  As tolerated with Full fall precautions use walker/cane & assistance as needed  Get Medicines reviewed and adjusted: Please take all your medications with you for your next visit with your Primary MD  Please request your Primary MD to go over all hospital tests and procedure/radiological results at the follow up, please ask your Primary MD to get all Hospital records sent to his/her office.  If you experience worsening of your admission symptoms, develop shortness of breath, life threatening emergency, suicidal or homicidal thoughts you must seek medical attention immediately by calling 911 or calling your MD immediately  if symptoms less severe.  You must read complete instructions/literature along with all the possible adverse reactions/side effects for all the Medicines you take and that have been prescribed to you. Take any new Medicines after you have completely  understood and accpet all the possible adverse reactions/side effects.   Do not drive when taking Pain medications.   Do not take more than prescribed Pain, Sleep and Anxiety Medications  Special Instructions: If you have smoked or chewed Tobacco  in the last 2 yrs please stop smoking, stop any regular Alcohol  and or any Recreational drug use.  Wear Seat belts while driving.  Please note  You were cared for by a hospitalist during your hospital stay. Once you are discharged, your primary care physician will handle any further medical issues. Please note that NO REFILLS for any discharge medications will be authorized once you are discharged, as it is imperative that you return to your primary care physician (or establish a relationship with a primary care physician if you do not have one) for your aftercare needs so that they can reassess your need for medications and monitor your lab values.   Diet recommendation: Heart Healthy diet  Discharge Instructions    Call MD for:  persistant dizziness or light-headedness    Complete by:  As directed      Diet - low sodium heart healthy    Complete by:  As directed      Increase activity slowly    Complete  by:  As directed            Follow-up Information    Follow up with TODD,JEFFREY ALLEN, MD. Schedule an appointment as soon as possible for a visit in 5 days.   Specialty:  Family Medicine   Why:  Hospital follow up-please ask for referral to ENT or audiologist if still with hearing loss   Contact information:   [place] [address] [phone]       Total Time spent on discharge equals 25  minutes.  SignedOren Binet 05/19/2016 11:34 AM

## 2016-05-19 NOTE — Evaluation (Signed)
Physical Therapy Evaluation Patient Details Name: Bruce Ball MRN: [id number] DOB: 30-Jun-1946 Today's Date: 05/19/2016   History of Present Illness  70 yo male admitted with ataxia, dizziness, nausea, hearing loss. Hx of HTN, LBP. Per pt, history of mild lightheadedness/dizziness with changing positions. New onset hearing loss, nausea, significant dizziness/spinning on 05/18/16. Pt wears hearing aids in both ears but has had to turn volume up to highest setting since yesterday. Yesterday morning symptoms began and did not lessen until ~2 pm.    Clinical Impression  Mobility eval: Pt was Mod Ind for bed mobility and standing, Min guard assist for ambulation-walked ~250'x1, 115'x1 with RW. Pt denied dizziness/spinning. Tolerated distance well.  Vestibular eval: Smooth pursuits, saccades, head thrust, head shaking-all negative. Mod Hallpike Dix-pt denied spinning/dizziness when lying onto R side. C/O mild dizziness when returning to upright position. No nystagmus noted. Pt c/o some dizziness when lying onto L side and when returning to upright position. No nystagmus noted. Symptoms lasted ~5-10 seconds then went away. Per pt, symptoms much improved compared to yesterday. Educated pt on slowly changing positions of head and body when mobilizing. Recommend follow up OP PT vestibular eval/tx at [facility] (especially if symptoms persist or worsen). Also recommend follow up with audiologist or ENT for evaluation since he is still experiencing a change in his hearing bilaterally.     Follow Up Recommendations Outpatient PT (vestibular eval/tx at [facility])    Equipment Recommendations  None recommended by PT    Recommendations for Other Services       Precautions / Restrictions Precautions Precautions: Fall Restrictions Weight Bearing Restrictions: No      Mobility  Bed Mobility Overal bed mobility: Modified Independent                Transfers Overall transfer level: Modified  independent                  Ambulation/Gait Ambulation/Gait assistance: Min guard;Supervision Ambulation Distance (Feet): 250 Feet (250'x1, 115'x1) Assistive device: Rolling walker (2 wheeled) Gait Pattern/deviations: Step-through pattern;Decreased stride length     General Gait Details: Pt denied dizziness/spinning with ambulation. Unsteady at times, but pt attributes this to poor functioning walker (Therapist tested hospital walker and some issues with wheel alignment/rolling in straight line confirmed). Pt tolerated distance well.  Stairs            Wheelchair Mobility    Modified Rankin (Stroke Patients Only)       Balance Overall balance assessment: Needs assistance           Standing balance-Leahy Scale: Poor Standing balance comment: pt uses walker at baseline                             Pertinent Vitals/Pain Pain Assessment: No/denies pain    Home Living Family/patient expects to be discharged to:: Private residence Living Arrangements: Spouse/significant other   Type of Home: House         Home Equipment: Walker - 4 wheels      Prior Function Level of Independence: Independent with assistive device(s)         Comments: per pt, he is supposed to use a walker at all times due to back and R LE deficits     Hand Dominance        Extremity/Trunk Assessment   Upper Extremity Assessment: Overall WFL for tasks assessed  Lower Extremity Assessment: Generalized weakness      Cervical / Trunk Assessment: Normal  Communication   Communication: HOH  Cognition Arousal/Alertness: Awake/alert Behavior During Therapy: WFL for tasks assessed/performed Overall Cognitive Status: Within Functional Limits for tasks assessed                      General Comments      Exercises        Assessment/Plan    PT Assessment All further PT needs can be met in the next venue of care (OP PT follow up at [facility]  for vestibular eval/tx especially if symptoms persist)  PT Diagnosis     PT Problem List    PT Treatment Interventions     PT Goals (Current goals can be found in the Care Plan section) Acute Rehab PT Goals Patient Stated Goal: home today PT Goal Formulation: All assessment and education complete, DC therapy    Frequency     Barriers to discharge        Co-evaluation               End of Session Equipment Utilized During Treatment: Gait belt Activity Tolerance: Patient tolerated treatment well Patient left: in bed;with call bell/phone within reach;with family/visitor present      Functional Assessment Tool Used: clinical judgement Functional Limitation: Mobility: Walking and moving around Mobility: Walking and Moving Around Current Status (X2119): At least 1 percent but less than 20 percent impaired, limited or restricted Mobility: Walking and Moving Around Goal Status [phone]): At least 1 percent but less than 20 percent impaired, limited or restricted Mobility: Walking and Moving Around Discharge Status [phone]): At least 1 percent but less than 20 percent impaired, limited or restricted    Time: 1856-3149 PT Time Calculation (min) (ACUTE ONLY): 25 min   Charges:   PT Evaluation $PT Eval Low Complexity: 1 Procedure PT Treatments $Gait Training: 8-22 mins   PT G Codes:   PT G-Codes **NOT FOR INPATIENT CLASS** Functional Assessment Tool Used: clinical judgement Functional Limitation: Mobility: Walking and moving around Mobility: Walking and Moving Around Current Status (F0263): At least 1 percent but less than 20 percent impaired, limited or restricted Mobility: Walking and Moving Around Goal Status [phone]): At least 1 percent but less than 20 percent impaired, limited or restricted Mobility: Walking and Moving Around Discharge Status [phone]): At least 1 percent but less than 20 percent impaired, limited or restricted    Weston Anna, MPT Pager: [phone]

## 2016-05-19 NOTE — Progress Notes (Signed)
Patient discharged to home with family via wheelchair, discharge instructions reviewed with patient who verbalized understanding. New RX's given to patient. 

## 2016-05-20 LAB — HEMOGLOBIN A1C
Hgb A1c MFr Bld: 5.5 % (ref 4.8–5.6)
Mean Plasma Glucose: 111 mg/dL

## 2016-05-20 LAB — GLUCOSE, CAPILLARY: Glucose-Capillary: 128 mg/dL — ABNORMAL HIGH (ref 65–99)

## 2016-06-06 ENCOUNTER — Encounter: Payer: Self-pay | Admitting: Family Medicine

## 2016-06-06 ENCOUNTER — Ambulatory Visit (INDEPENDENT_AMBULATORY_CARE_PROVIDER_SITE_OTHER): Payer: Medicare Other | Admitting: Family Medicine

## 2016-06-06 VITALS — BP 124/76 | HR 51 | Temp 98.0°F | Ht 71.0 in | Wt 238.0 lb

## 2016-06-06 DIAGNOSIS — I10 Essential (primary) hypertension: Secondary | ICD-10-CM | POA: Diagnosis not present

## 2016-06-06 DIAGNOSIS — H538 Other visual disturbances: Secondary | ICD-10-CM | POA: Diagnosis not present

## 2016-06-06 DIAGNOSIS — R55 Syncope and collapse: Secondary | ICD-10-CM

## 2016-06-06 DIAGNOSIS — H9191 Unspecified hearing loss, right ear: Secondary | ICD-10-CM | POA: Diagnosis not present

## 2016-06-06 DIAGNOSIS — R42 Dizziness and giddiness: Secondary | ICD-10-CM

## 2016-06-06 NOTE — Progress Notes (Signed)
Pre visit review using our clinic review tool, if applicable. No additional management support is needed unless otherwise documented below in the visit note. 
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
## 2016-06-06 NOTE — Patient Instructions (Signed)
Patient requested to leave without avs due to time commitment

## 2016-06-06 NOTE — Progress Notes (Deleted)
Subjective:  Bruce Ball is a 70 y.o. year old very pleasant male patient who presents for/with See problem oriented charting ROS- ***.see any ROS included in HPI as well.   Past Medical History-  Patient Active Problem List   Diagnosis Date Noted  . Ataxia 05/18/2016  . Vestibular disequilibrium 05/18/2016  . Ankylosing spondylitis ([place]) 05/24/2014  . Lung nodule, solitary 05/24/2014  . Peripheral edema 06/08/2012  . HYPOTENSION 08/13/2010  . EXTRINSIC ASTHMA, UNSPECIFIED 04/07/2009  . LOW BACK PAIN 04/21/2008  . PUD, HX OF 04/21/2008  . NEPHROLITHIASIS, HX OF 04/21/2008  . HYPERTENSION 10/29/2007    Medications- reviewed and updated Current Outpatient Prescriptions  Medication Sig Dispense Refill  . amLODipine (NORVASC) 10 MG tablet Take 10 mg by mouth daily.      . carvedilol (COREG) 25 MG tablet Take 25 mg by mouth 2 (two) times daily with a meal.      . Cholecalciferol (VITAMIN D) 2000 units CAPS Take 1 capsule by mouth daily.    . cloNIDine (CATAPRES) 0.2 MG tablet Take 0.2 mg by mouth at bedtime.     . meclizine (ANTIVERT) 25 MG tablet Take 1 tablet (25 mg total) by mouth 3 (three) times daily as needed for dizziness. 30 tablet 0  . NONFORMULARY OR COMPOUNDED ITEM Please evaluate and treat for vestibular PT Diagnoses:acute labrynthitis 1 each 0  . omeprazole (PRILOSEC) 20 MG capsule Take 20 mg by mouth at bedtime.     . predniSONE (DELTASONE) 10 MG tablet Take 6 tablets (60 mg) daily for 5 days, then, Take 4 tablets (40 mg) daily for 1 day, then, Take 3 tablets (30 mg) daily for 1 day, then, Take 2 tablets (20 mg) daily for 1 day, then, Take 1 tablets (10 mg) daily for 1 day, then, Then resume usual dosing of 0.5 tablets (5 mg) 40 tablet 0  . predniSONE (DELTASONE) 5 MG tablet Take 1 tablet (5 mg total) by mouth daily with breakfast. Resume once you have completed a taper of prednisone as instructed     No current facility-administered medications for this visit.     Objective: BP 124/76 mmHg  Pulse 51  Temp(Src) 98 F (36.7 C) (Oral)  Ht 5\' 11"  (1.803 m)  Wt 238 lb (107.956 kg)  BMI 33.21 kg/m2  SpO2 97% Gen: NAD, resting comfortably CV: RRR no murmurs rubs or gallops Lungs: CTAB no crackles, wheeze, rhonchi Abdomen: soft/nontender/nondistended/normal bowel sounds. No rebound or guarding.  Ext: no edema Skin: warm, dry Neuro: grossly normal, moves all extremities  ***  Assessment/Plan:  11 18.   CT head- no acute changs on CT 05/18/16.  MR brain- similar- some vascular changes CT angiogram-0 no acute disease  May 23rd pcp [facility]  31st ent. ENT note- progressive hearing loss- continue hearing aids and f/u prn new concerns and rescheduled with audiology. Alfonso Patten ear worse  June 19th thyroid US  30th- audiology July 11th mri  B12***  See ENT and cardiology ? *** US thyroid.   Start meclizine and vestib rehab  pred taper  Stay on amlod 10, coreg 25, clonidine, prilosec  Stop hctz  Hx ankylosing spondylitis on chronic prednisone p/w vertigo. Acute labyrinithitis given hearing loss. L >R hearing loss- taper steroids. Hearing aids baseline   No problem-specific assessment & plan notes found for this encounter.   No Follow-up on file. Return precautions advised.   No orders of the defined types were placed in this encounter.    No  orders of the defined types were placed in this encounter.    [name], MD

## 2016-06-14 ENCOUNTER — Encounter: Payer: Self-pay | Admitting: Family Medicine

## 2016-06-19 ENCOUNTER — Other Ambulatory Visit: Payer: Self-pay

## 2016-06-26 ENCOUNTER — Ambulatory Visit (INDEPENDENT_AMBULATORY_CARE_PROVIDER_SITE_OTHER): Payer: Medicare Other | Admitting: Family Medicine

## 2016-06-26 ENCOUNTER — Encounter: Payer: Self-pay | Admitting: Family Medicine

## 2016-06-26 ENCOUNTER — Other Ambulatory Visit: Payer: Self-pay

## 2016-06-26 VITALS — BP 140/72 | HR 51 | Temp 98.0°F | Ht 71.0 in | Wt 238.0 lb

## 2016-06-26 DIAGNOSIS — H539 Unspecified visual disturbance: Secondary | ICD-10-CM

## 2016-06-26 DIAGNOSIS — R319 Hematuria, unspecified: Secondary | ICD-10-CM | POA: Diagnosis not present

## 2016-06-26 DIAGNOSIS — H9190 Unspecified hearing loss, unspecified ear: Secondary | ICD-10-CM

## 2016-06-26 LAB — CBC
HCT: 46.5 % (ref 39.0–52.0)
Hemoglobin: 15.4 g/dL (ref 13.0–17.0)
MCHC: 33.1 g/dL (ref 30.0–36.0)
MCV: 86.2 fl (ref 78.0–100.0)
Platelets: 224 10*3/uL (ref 150.0–400.0)
RBC: 5.4 Mil/uL (ref 4.22–5.81)
RDW: 14.2 % (ref 11.5–15.5)
WBC: 8.4 10*3/uL (ref 4.0–10.5)

## 2016-06-26 LAB — POC URINALSYSI DIPSTICK (AUTOMATED)
Glucose, UA: NEGATIVE
Ketones, UA: NEGATIVE
Leukocytes, UA: NEGATIVE
Nitrite, UA: NEGATIVE
Spec Grav, UA: 1.025
Urobilinogen, UA: 1
pH, UA: 5.5

## 2016-06-26 LAB — BASIC METABOLIC PANEL
BUN: 22 mg/dL (ref 6–23)
CO2: 30 mEq/L (ref 19–32)
Calcium: 9.3 mg/dL (ref 8.4–10.5)
Chloride: 105 mEq/L (ref 96–112)
Creatinine, Ser: 1.25 mg/dL (ref 0.40–1.50)
GFR: 60.69 mL/min (ref >60.00)
Glucose, Bld: 118 mg/dL — ABNORMAL HIGH (ref 70–99)
Potassium: 4.2 mEq/L (ref 3.5–5.1)
Sodium: 144 mEq/L (ref 135–145)

## 2016-06-26 LAB — URINALYSIS, MICROSCOPIC ONLY

## 2016-06-26 MED ORDER — HYDROCODONE-ACETAMINOPHEN 5-325 MG PO TABS: 1.0000 | ORAL_TABLET | Freq: Four times a day (QID) | ORAL | Status: DC | PRN

## 2016-06-26 NOTE — Patient Instructions (Addendum)
Labs before you leave  We will call you within a week about your referral to urology (trying to get you in within a week)

## 2016-06-26 NOTE — Progress Notes (Signed)
Pre visit review using our clinic review tool, if applicable. No additional management support is needed unless otherwise documented below in the visit note. 

## 2016-06-26 NOTE — Progress Notes (Signed)
Subjective:  Bruce Ball is a 70 y.o. year old very pleasant male patient who presents for/with See problem oriented charting ROS- see any ROS included in HPI as well.   Past Medical History-  Patient Active Problem List   Diagnosis Date Noted  . Ataxia 05/18/2016  . Vestibular disequilibrium 05/18/2016  . Ankylosing spondylitis ([place]) 05/24/2014  . Lung nodule, solitary 05/24/2014  . Peripheral edema 06/08/2012  . HYPOTENSION 08/13/2010  . EXTRINSIC ASTHMA, UNSPECIFIED 04/07/2009  . LOW BACK PAIN 04/21/2008  . PUD, HX OF 04/21/2008  . NEPHROLITHIASIS, HX OF 04/21/2008  . Essential hypertension 10/29/2007    Medications- reviewed and updated Current Outpatient Prescriptions  Medication Sig Dispense Refill  . amLODipine (NORVASC) 10 MG tablet Take 10 mg by mouth daily.      . carvedilol (COREG) 25 MG tablet Take 25 mg by mouth 2 (two) times daily with a meal.      . Cholecalciferol (VITAMIN D) 2000 units CAPS Take 1 capsule by mouth daily.    . cloNIDine (CATAPRES) 0.2 MG tablet Take 0.2 mg by mouth at bedtime.     Marland Kitchen omeprazole (PRILOSEC) 20 MG capsule Take 20 mg by mouth at bedtime.     . predniSONE (DELTASONE) 10 MG tablet Take 6 tablets (60 mg) daily for 5 days, then, Take 4 tablets (40 mg) daily for 1 day, then, Take 3 tablets (30 mg) daily for 1 day, then, Take 2 tablets (20 mg) daily for 1 day, then, Take 1 tablets (10 mg) daily for 1 day, then, Then resume usual dosing of 0.5 tablets (5 mg) 40 tablet 0  . predniSONE (DELTASONE) 5 MG tablet Take 1 tablet (5 mg total) by mouth daily with breakfast. Resume once you have completed a taper of prednisone as instructed     No current facility-administered medications for this visit.    Objective: BP 140/72 mmHg  Pulse 51  Temp(Src) 98 F (36.7 C) (Oral)  Ht 5\' 11"  (1.803 m)  Wt 238 lb (107.956 kg)  BMI 33.21 kg/m2  SpO2 97% Gen: NAD, resting comfortably CV: RRR no murmurs rubs or gallops Lungs: CTAB no crackles,  wheeze, rhonchi Abdomen: soft/nontender/nondistended/normal bowel sounds. No rebound or guarding.  No CVA tenderness Ext: no edema Skin: warm, dry Neuro: grossly normal, moves all extremities  Assessment/Plan:   Gross hematuria S: Started passing blood in urine 2 days ago. Seems to happen with every urination. Seems to be darker today. Does have some right low back pain mild comes and goes. Has had kidney stones in the past- 1.5 years ago through [place] was managed. History prostatectomy from prostate cancer- PSAs have remained 0.  ROS- no fevers. Always has some chills. Mild nausea. No vomiting. No burning with urination.  A/P: #5 vicodin in case this is a kidney stone that will soon produce pain. Had already ordered urine microscopic before knew gross hematuria  but microscopic shows too numerous to count red blood cells. Will also get BMP and CBC. Also get urine culture. Refer to urology, try to get in within a week. Had previously seen [facility] urology but would take too long to get in for this issue.    Orders Placed This Encounter  Procedures  . Urine culture    [facility]  . Urine Microscopic  . Basic metabolic panel    [place]  . CBC    [place]  . Ambulatory referral to Urology    Referral Priority:  Urgent    Referral Type:  Consultation    Referral Reason:  Specialty Services Required    Requested Specialty:  Urology    Number of Visits Requested:  1  . POCT Urinalysis Dipstick (Automated)    Meds ordered this encounter  Medications  . HYDROcodone-acetaminophen (NORCO/VICODIN) 5-325 MG tablet    Sig: Take 1 tablet by mouth every 6 (six) hours as needed.    Dispense:  5 tablet    Refill:  0  New acute condition. Medication management. High risk patient with recent concern for demyelinating disease versus microvascular ischemia in the brain. Also high risk due to age, hypertension, ankylosing spondylitis  Return precautions advised.  [name], MD

## 2016-06-26 NOTE — Telephone Encounter (Signed)
Referral made to [facility] and [facility]

## 2016-06-28 ENCOUNTER — Other Ambulatory Visit: Payer: Self-pay | Admitting: Family Medicine

## 2016-06-28 MED ORDER — AMOXICILLIN-POT CLAVULANATE 875-125 MG PO TABS: 1.0000 | ORAL_TABLET | Freq: Two times a day (BID) | ORAL | Status: DC

## 2016-06-29 LAB — URINE CULTURE: Colony Count: 35000

## 2016-07-12 DIAGNOSIS — S0101XA Laceration without foreign body of scalp, initial encounter: Secondary | ICD-10-CM | POA: Diagnosis not present

## 2016-07-24 DIAGNOSIS — H547 Unspecified visual loss: Secondary | ICD-10-CM | POA: Diagnosis not present

## 2016-07-24 DIAGNOSIS — R51 Headache: Secondary | ICD-10-CM | POA: Diagnosis not present

## 2016-07-24 DIAGNOSIS — I10 Essential (primary) hypertension: Secondary | ICD-10-CM | POA: Diagnosis not present

## 2016-07-24 DIAGNOSIS — H539 Unspecified visual disturbance: Secondary | ICD-10-CM | POA: Diagnosis not present

## 2016-07-24 DIAGNOSIS — R251 Tremor, unspecified: Secondary | ICD-10-CM | POA: Diagnosis not present

## 2016-07-24 DIAGNOSIS — G529 Cranial nerve disorder, unspecified: Secondary | ICD-10-CM | POA: Diagnosis not present

## 2016-07-24 DIAGNOSIS — Z87891 Personal history of nicotine dependence: Secondary | ICD-10-CM | POA: Diagnosis not present

## 2016-07-24 DIAGNOSIS — M459 Ankylosing spondylitis of unspecified sites in spine: Secondary | ICD-10-CM | POA: Diagnosis not present

## 2016-07-24 DIAGNOSIS — H9190 Unspecified hearing loss, unspecified ear: Secondary | ICD-10-CM | POA: Diagnosis not present

## 2016-07-24 DIAGNOSIS — I7090 Unspecified atherosclerosis: Secondary | ICD-10-CM | POA: Diagnosis not present

## 2016-07-24 LAB — HM HEPATITIS C SCREENING LAB: HM Hepatitis Screen: NEGATIVE

## 2016-08-06 DIAGNOSIS — Z9849 Cataract extraction status, unspecified eye: Secondary | ICD-10-CM | POA: Diagnosis not present

## 2016-08-06 DIAGNOSIS — H538 Other visual disturbances: Secondary | ICD-10-CM | POA: Diagnosis not present

## 2016-08-06 DIAGNOSIS — Q899 Congenital malformation, unspecified: Secondary | ICD-10-CM | POA: Diagnosis not present

## 2016-08-06 DIAGNOSIS — Z87891 Personal history of nicotine dependence: Secondary | ICD-10-CM | POA: Diagnosis not present

## 2016-08-06 DIAGNOSIS — Z79899 Other long term (current) drug therapy: Secondary | ICD-10-CM | POA: Diagnosis not present

## 2016-08-06 DIAGNOSIS — Z7982 Long term (current) use of aspirin: Secondary | ICD-10-CM | POA: Diagnosis not present

## 2016-08-06 DIAGNOSIS — I10 Essential (primary) hypertension: Secondary | ICD-10-CM | POA: Diagnosis not present

## 2016-08-06 DIAGNOSIS — H5213 Myopia, bilateral: Secondary | ICD-10-CM | POA: Diagnosis not present

## 2016-08-13 DIAGNOSIS — H43812 Vitreous degeneration, left eye: Secondary | ICD-10-CM | POA: Diagnosis not present

## 2016-08-26 ENCOUNTER — Other Ambulatory Visit: Payer: Self-pay

## 2016-09-04 DIAGNOSIS — G4489 Other headache syndrome: Secondary | ICD-10-CM | POA: Diagnosis not present

## 2017-01-03 ENCOUNTER — Encounter: Payer: Self-pay | Admitting: Adult Health

## 2017-01-03 ENCOUNTER — Ambulatory Visit (INDEPENDENT_AMBULATORY_CARE_PROVIDER_SITE_OTHER): Payer: Medicare Other | Admitting: Adult Health

## 2017-01-03 VITALS — BP 130/64 | Temp 99.1°F | Ht 71.0 in | Wt 230.0 lb

## 2017-01-03 DIAGNOSIS — J0110 Acute frontal sinusitis, unspecified: Secondary | ICD-10-CM | POA: Diagnosis not present

## 2017-01-03 MED ORDER — DOXYCYCLINE HYCLATE 100 MG PO CAPS: 100.0000 mg | ORAL_CAPSULE | Freq: Two times a day (BID) | ORAL | 0 refills | Status: DC

## 2017-01-03 MED ORDER — BENZONATATE 100 MG PO CAPS: 100.0000 mg | ORAL_CAPSULE | Freq: Three times a day (TID) | ORAL | 1 refills | Status: DC | PRN

## 2017-01-03 NOTE — Progress Notes (Signed)
Subjective:    Patient ID: Bruce Ball, male    DOB: [birth year], 71 y.o.   MRN: [id number]  HPI  71 year old male who  has a past medical history of Extrinsic asthma, unspecified (04/07/2009); HYPERTENSION (10/29/2007); HYPOTENSION (08/13/2010); LOW BACK PAIN (04/21/2008); NEPHROLITHIASIS, HX OF (04/21/2008); and PUD, HX OF (04/21/2008). He presents to the office today with five days of low grade fever ( up to 101 ), sinus pain/pressure, productive cough, generalized fatigue, rhinorrhea and feeling ill.   He has not been taking any OTC medication.   He denies n/v/d  Review of Systems  Constitutional: Positive for activity change, appetite change, chills, fatigue and fever. Negative for diaphoresis.  HENT: Positive for congestion, postnasal drip, rhinorrhea, sinus pain and sinus pressure. Negative for ear pain.   Respiratory: Positive for cough. Negative for apnea, shortness of breath and wheezing.   Cardiovascular: Negative.   Gastrointestinal: Negative.   Musculoskeletal: Negative.   Neurological: Negative.   Hematological: Negative.   All other systems reviewed and are negative.  Past Medical History:  Diagnosis Date  . Extrinsic asthma, unspecified 04/07/2009  . HYPERTENSION 10/29/2007  . HYPOTENSION 08/13/2010  . LOW BACK PAIN 04/21/2008  . NEPHROLITHIASIS, HX OF 04/21/2008  . PUD, HX OF 04/21/2008    Social History   Social History  . Marital status: Married    Spouse name: N/A  . Number of children: N/A  . Years of education: N/A   Occupational History  . accountant    Social History Main Topics  . Smoking status: Former Smoker    Quit date: 12/30/1978  . Smokeless tobacco: Never Used  . Alcohol use No  . Drug use: No  . Sexual activity: Not on file   Other Topics Concern  . Not on file   Social History Narrative  . No narrative on file    Past Surgical History:  Procedure Laterality Date  . bone cancer     rt femur removed and steel rod placed  .  prosatectomy      Family History  Problem Relation Age of Onset  . COPD Father   . Cancer Other     prostate    No Known Allergies  Current Outpatient Prescriptions on File Prior to Visit  Medication Sig Dispense Refill  . amLODipine (NORVASC) 10 MG tablet Take 10 mg by mouth daily.      Marland Kitchen amoxicillin-clavulanate (AUGMENTIN) 875-125 MG tablet Take 1 tablet by mouth 2 (two) times daily. 20 tablet 0  . carvedilol (COREG) 25 MG tablet Take 25 mg by mouth 2 (two) times daily with a meal.      . Cholecalciferol (VITAMIN D) 2000 units CAPS Take 1 capsule by mouth daily.    . cloNIDine (CATAPRES) 0.2 MG tablet Take 0.2 mg by mouth at bedtime.     Marland Kitchen HYDROcodone-acetaminophen (NORCO/VICODIN) 5-325 MG tablet Take 1 tablet by mouth every 6 (six) hours as needed. 5 tablet 0  . omeprazole (PRILOSEC) 20 MG capsule Take 20 mg by mouth at bedtime.     . predniSONE (DELTASONE) 10 MG tablet Take 6 tablets (60 mg) daily for 5 days, then, Take 4 tablets (40 mg) daily for 1 day, then, Take 3 tablets (30 mg) daily for 1 day, then, Take 2 tablets (20 mg) daily for 1 day, then, Take 1 tablets (10 mg) daily for 1 day, then, Then resume usual dosing of 0.5 tablets (5 mg) 40 tablet 0  . predniSONE (DELTASONE)  5 MG tablet Take 1 tablet (5 mg total) by mouth daily with breakfast. Resume once you have completed a taper of prednisone as instructed     No current facility-administered medications on file prior to visit.     BP 130/64   Temp 99.1 F (37.3 C) (Oral)   Ht 5\' 11"  (1.803 m)   Wt 230 lb (104.3 kg)   BMI 32.08 kg/m       Objective:   Physical Exam  Constitutional: He is oriented to person, place, and time. He appears well-developed and well-nourished. No distress.  HENT:  Head: Normocephalic and atraumatic.  Right Ear: Hearing, tympanic membrane, external ear and ear canal normal.  Left Ear: Hearing, tympanic membrane, external ear and ear canal normal.  Nose: Mucosal edema and rhinorrhea  present. Right sinus exhibits frontal sinus tenderness. Left sinus exhibits frontal sinus tenderness.  Mouth/Throat: Oropharyngeal exudate present. No posterior oropharyngeal erythema.  Eyes: Conjunctivae and EOM are normal. Pupils are equal, round, and reactive to light. Right eye exhibits no discharge. No scleral icterus.  Neck: Normal range of motion. Neck supple.  Cardiovascular: Normal rate, regular rhythm, normal heart sounds and intact distal pulses.  Exam reveals no gallop and no friction rub.   No murmur heard. Pulmonary/Chest: Effort normal and breath sounds normal. No respiratory distress. He has no wheezes. He has no rales. He exhibits no tenderness.  Lymphadenopathy:    He has no cervical adenopathy.  Neurological: He is alert and oriented to person, place, and time.  Skin: Skin is warm and dry. No rash noted. He is not diaphoretic. No erythema. No pallor.  Psychiatric: He has a normal mood and affect. His behavior is normal. Judgment and thought content normal.  Nursing note and vitals reviewed.     Assessment & Plan:  1. Acute frontal sinusitis, recurrence not specified - Lungs clear. No signs of pneumonia.  - doxycycline (VIBRAMYCIN) 100 MG capsule; Take 1 capsule (100 mg total) by mouth 2 (two) times daily.  Dispense: 14 capsule; Refill: 0 - benzonatate (TESSALON) 100 MG capsule; Take 1 capsule (100 mg total) by mouth 3 (three) times daily as needed for cough.  Dispense: 20 capsule; Refill: 1 - Use Flonase - Motrin/Tylenol for symptom relief.  - Follow up if no improvement in the next 2-3 days   Dorothyann Peng, NP

## 2017-01-04 ENCOUNTER — Telehealth: Payer: Self-pay

## 2017-01-04 NOTE — Telephone Encounter (Signed)
[facility] called and stated that the rx for abx was not at his pharmcy.  Erx was sent and received. Called pof and they stated that there are two [facility] on [address]. Pharmacist [facility]) placed rx on hold. Contacted pt and informed that rx is on hold and to [facility]x.   Confirmed with pt which pharmacy they wanted any future rx's to go. Change pharmacy preference to the [facility] on [address] and [address]. POF is correct now.   Pt stated understanding.

## 2017-01-28 ENCOUNTER — Other Ambulatory Visit: Payer: Self-pay

## 2017-01-28 ENCOUNTER — Telehealth: Payer: Self-pay | Admitting: Family Medicine

## 2017-01-28 DIAGNOSIS — H9193 Unspecified hearing loss, bilateral: Secondary | ICD-10-CM

## 2017-01-28 NOTE — Telephone Encounter (Signed)
Pt loss some hearing in left ear and all hearing  in right ear last year. Pt would a referral to ear center for audiologist and cochlear evaluation fax [phone] phone [phone]

## 2017-01-28 NOTE — Telephone Encounter (Signed)
Yes thanks may enter referral

## 2017-01-28 NOTE — Telephone Encounter (Signed)
Referral placed as requested.

## 2017-01-31 ENCOUNTER — Other Ambulatory Visit: Payer: Self-pay

## 2017-01-31 DIAGNOSIS — H9193 Unspecified hearing loss, bilateral: Secondary | ICD-10-CM

## 2017-02-20 DIAGNOSIS — H903 Sensorineural hearing loss, bilateral: Secondary | ICD-10-CM | POA: Diagnosis not present

## 2017-02-20 DIAGNOSIS — H9313 Tinnitus, bilateral: Secondary | ICD-10-CM | POA: Diagnosis not present

## 2017-02-28 DIAGNOSIS — I672 Cerebral atherosclerosis: Secondary | ICD-10-CM | POA: Diagnosis not present

## 2017-02-28 DIAGNOSIS — R109 Unspecified abdominal pain: Secondary | ICD-10-CM | POA: Diagnosis not present

## 2017-02-28 DIAGNOSIS — Z79899 Other long term (current) drug therapy: Secondary | ICD-10-CM | POA: Diagnosis not present

## 2017-02-28 DIAGNOSIS — N189 Chronic kidney disease, unspecified: Secondary | ICD-10-CM | POA: Diagnosis not present

## 2017-02-28 DIAGNOSIS — Z7982 Long term (current) use of aspirin: Secondary | ICD-10-CM | POA: Diagnosis not present

## 2017-02-28 DIAGNOSIS — I129 Hypertensive chronic kidney disease with stage 1 through stage 4 chronic kidney disease, or unspecified chronic kidney disease: Secondary | ICD-10-CM | POA: Diagnosis not present

## 2017-02-28 DIAGNOSIS — N200 Calculus of kidney: Secondary | ICD-10-CM | POA: Diagnosis not present

## 2017-02-28 DIAGNOSIS — I2510 Atherosclerotic heart disease of native coronary artery without angina pectoris: Secondary | ICD-10-CM | POA: Diagnosis not present

## 2017-02-28 DIAGNOSIS — I6509 Occlusion and stenosis of unspecified vertebral artery: Secondary | ICD-10-CM | POA: Diagnosis not present

## 2017-02-28 DIAGNOSIS — R55 Syncope and collapse: Secondary | ICD-10-CM | POA: Insufficient documentation

## 2017-02-28 DIAGNOSIS — R9082 White matter disease, unspecified: Secondary | ICD-10-CM | POA: Diagnosis not present

## 2017-02-28 DIAGNOSIS — Z8546 Personal history of malignant neoplasm of prostate: Secondary | ICD-10-CM | POA: Insufficient documentation

## 2017-02-28 DIAGNOSIS — E042 Nontoxic multinodular goiter: Secondary | ICD-10-CM | POA: Diagnosis not present

## 2017-02-28 DIAGNOSIS — N19 Unspecified kidney failure: Secondary | ICD-10-CM | POA: Diagnosis not present

## 2017-02-28 DIAGNOSIS — G8929 Other chronic pain: Secondary | ICD-10-CM | POA: Diagnosis not present

## 2017-02-28 DIAGNOSIS — Z7952 Long term (current) use of systemic steroids: Secondary | ICD-10-CM | POA: Diagnosis not present

## 2017-02-28 DIAGNOSIS — I6782 Cerebral ischemia: Secondary | ICD-10-CM | POA: Diagnosis not present

## 2017-02-28 DIAGNOSIS — M545 Low back pain: Secondary | ICD-10-CM | POA: Diagnosis not present

## 2017-02-28 DIAGNOSIS — M47816 Spondylosis without myelopathy or radiculopathy, lumbar region: Secondary | ICD-10-CM | POA: Insufficient documentation

## 2017-02-28 DIAGNOSIS — M5134 Other intervertebral disc degeneration, thoracic region: Secondary | ICD-10-CM | POA: Insufficient documentation

## 2017-02-28 DIAGNOSIS — Z87442 Personal history of urinary calculi: Secondary | ICD-10-CM | POA: Diagnosis not present

## 2017-02-28 DIAGNOSIS — N179 Acute kidney failure, unspecified: Secondary | ICD-10-CM | POA: Diagnosis not present

## 2017-02-28 DIAGNOSIS — Z85830 Personal history of malignant neoplasm of bone: Secondary | ICD-10-CM | POA: Insufficient documentation

## 2017-02-28 DIAGNOSIS — M549 Dorsalgia, unspecified: Secondary | ICD-10-CM | POA: Diagnosis not present

## 2017-02-28 DIAGNOSIS — I639 Cerebral infarction, unspecified: Secondary | ICD-10-CM | POA: Diagnosis not present

## 2017-02-28 DIAGNOSIS — E860 Dehydration: Secondary | ICD-10-CM | POA: Diagnosis not present

## 2017-02-28 NOTE — Progress Notes (Deleted)
Office Visit Note  Patient: Bruce Ball             Date of Birth: 07/27/1946           MRN: [id number]             PCP: [name], MD Referring: [name], MD Visit Date: 03/11/2017 Occupation: '@GUAROCC'$ @    Subjective:  No chief complaint on file.   History of Present Illness: Bruce Ball is a 71 y.o. male ***   Activities of Daily Living:  Patient reports morning stiffness for *** {minute/hour:19697}.   Patient {ACTIONS;DENIES/REPORTS:21021675::"Denies"} nocturnal pain.  Difficulty dressing/grooming: {ACTIONS;DENIES/REPORTS:21021675::"Denies"} Difficulty climbing stairs: {ACTIONS;DENIES/REPORTS:21021675::"Denies"} Difficulty getting out of chair: {ACTIONS;DENIES/REPORTS:21021675::"Denies"} Difficulty using hands for taps, buttons, cutlery, and/or writing: {ACTIONS;DENIES/REPORTS:21021675::"Denies"}   No Rheumatology ROS completed.   PMFS History:  Patient Active Problem List   Diagnosis Date Noted  . Ataxia 05/18/2016  . Vestibular disequilibrium 05/18/2016  . Ankylosing spondylitis ([place]) 05/24/2014  . Lung nodule, solitary 05/24/2014  . Peripheral edema 06/08/2012  . HYPOTENSION 08/13/2010  . EXTRINSIC ASTHMA, UNSPECIFIED 04/07/2009  . LOW BACK PAIN 04/21/2008  . PUD, HX OF 04/21/2008  . NEPHROLITHIASIS, HX OF 04/21/2008  . Essential hypertension 10/29/2007    Past Medical History:  Diagnosis Date  . Extrinsic asthma, unspecified 04/07/2009  . HYPERTENSION 10/29/2007  . HYPOTENSION 08/13/2010  . LOW BACK PAIN 04/21/2008  . NEPHROLITHIASIS, HX OF 04/21/2008  . PUD, HX OF 04/21/2008    Family History  Problem Relation Age of Onset  . COPD Father   . Cancer Other     prostate   Past Surgical History:  Procedure Laterality Date  . bone cancer     rt femur removed and steel rod placed  . prosatectomy     Social History   Social History Narrative  . No narrative on file     Objective: Vital Signs: There were no vitals taken for  this visit.   Physical Exam   Musculoskeletal Exam: ***  CDAI Exam: No CDAI exam completed.    Investigation: Findings:  November 2014:  CBC, comprehensive metabolic panel, sed rate, CK, TSH, SPEP were normal.  Immunoglobulin IgM was 39 which was slightly lower than normal limits. HLA-B27 was negative. May 2015 hepatitis panel negative, G6PD normal, HIV negative, patient received pneumococcal vaccine and shingles vaccine in 2015 May 2016:  CBC, comprehensive metabolic panel and TB Gold was negative.   03/11/2016  He stated he had  bone density at the [facility] which was normal , I do not have the report available.  He states he has been getting labs every 6 months at the [facility] which have been normal also.    He has right hip prosthesis for chondrosarcoma and has persistent pain in his right hip and has a limp because of this reason.     Imaging: No results found.  Speciality Comments: No specialty comments available.    Procedures:  No procedures performed Allergies: Patient has no known allergies.   Assessment / Plan:     Visit Diagnoses: Ankylosing spondylitis of multiple sites in spine ([place])  NEPHROLITHIASIS, HX OF  History of asthma  High risk medication use - [facility]   history of inadequate response to Enbrel and Humira fatigue with SSZ     Orders: No orders of the defined types were placed in this encounter.  No orders of the defined types were placed in this encounter.  Face-to-face time spent with patient was *** minutes. 50% of time was spent in counseling and coordination of care.  Follow-Up Instructions: No Follow-up on file.   Amy Littrell, RT  Note - This record has been created using [employer].  Chart creation errors have been sought, but may not always  have been located. Such creation errors do not reflect on  the standard of medical care.

## 2017-03-01 DIAGNOSIS — E860 Dehydration: Secondary | ICD-10-CM | POA: Insufficient documentation

## 2017-03-01 DIAGNOSIS — R200 Anesthesia of skin: Secondary | ICD-10-CM | POA: Diagnosis not present

## 2017-03-01 DIAGNOSIS — I6782 Cerebral ischemia: Secondary | ICD-10-CM | POA: Diagnosis not present

## 2017-03-01 DIAGNOSIS — R55 Syncope and collapse: Secondary | ICD-10-CM | POA: Diagnosis not present

## 2017-03-01 DIAGNOSIS — M545 Low back pain: Secondary | ICD-10-CM | POA: Diagnosis not present

## 2017-03-01 DIAGNOSIS — R42 Dizziness and giddiness: Secondary | ICD-10-CM | POA: Diagnosis not present

## 2017-03-01 DIAGNOSIS — N19 Unspecified kidney failure: Secondary | ICD-10-CM | POA: Diagnosis not present

## 2017-03-01 DIAGNOSIS — N179 Acute kidney failure, unspecified: Secondary | ICD-10-CM | POA: Diagnosis not present

## 2017-03-01 DIAGNOSIS — M549 Dorsalgia, unspecified: Secondary | ICD-10-CM | POA: Diagnosis not present

## 2017-03-01 DIAGNOSIS — I129 Hypertensive chronic kidney disease with stage 1 through stage 4 chronic kidney disease, or unspecified chronic kidney disease: Secondary | ICD-10-CM | POA: Diagnosis not present

## 2017-03-01 DIAGNOSIS — R109 Unspecified abdominal pain: Secondary | ICD-10-CM | POA: Insufficient documentation

## 2017-03-01 DIAGNOSIS — N189 Chronic kidney disease, unspecified: Secondary | ICD-10-CM | POA: Diagnosis not present

## 2017-03-01 DIAGNOSIS — G8929 Other chronic pain: Secondary | ICD-10-CM | POA: Diagnosis not present

## 2017-03-08 DIAGNOSIS — Z8546 Personal history of malignant neoplasm of prostate: Secondary | ICD-10-CM | POA: Insufficient documentation

## 2017-03-08 DIAGNOSIS — Z96641 Presence of right artificial hip joint: Secondary | ICD-10-CM | POA: Insufficient documentation

## 2017-03-11 ENCOUNTER — Ambulatory Visit: Payer: Self-pay | Admitting: Rheumatology

## 2017-07-30 HISTORY — PX: LUMBAR SPINE SURGERY: SHX701

## 2017-08-28 ENCOUNTER — Encounter: Payer: Self-pay | Admitting: Family Medicine

## 2017-08-28 ENCOUNTER — Ambulatory Visit (INDEPENDENT_AMBULATORY_CARE_PROVIDER_SITE_OTHER): Payer: Medicare Other | Admitting: Family Medicine

## 2017-08-28 VITALS — BP 126/70 | HR 72 | Temp 99.0°F | Ht 71.0 in | Wt 238.6 lb

## 2017-08-28 DIAGNOSIS — M79671 Pain in right foot: Secondary | ICD-10-CM

## 2017-08-28 NOTE — Patient Instructions (Addendum)
Lets get an x-ray of the foot to rule out fracture and see if they can comment on arthritis.   Please go to [facility] - located [address] across the [address] from [place] - in the basement - Hours: 8:30-5:30 PM M-F. Do not need appointment.   I would try ice 20 minutes 3x a day for next 3 days to see if that helps- can continue if so. Could also try heat in similar manner.   Does not look infectious- if it was possible viral infection from shingles but does not fit distribution that would be expected. Does not look like blood clot without calf pain and with no leg swelling.   Please stop by lab before you go

## 2017-08-28 NOTE — Progress Notes (Signed)
. Subjective:  Bruce Ball is a 71 y.o. year old very pleasant male patient who presents for/with See problem oriented charting ROS- no fever, chills, nausea, vomiting, chronic back pain unchanged   Past Medical History-  Patient Active Problem List   Diagnosis Date Noted  . High risk medication use 02/28/2017    Priority: High  . Ankylosing spondylitis ([place]) 05/24/2014    Priority: High  . History of prostate cancer 03/08/2017    Priority: Medium  . History of total right hip replacement 03/08/2017  . 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 Spondylosis of lumbar region without myelopathy or radiculopathy 02/28/2017  . DDD (degenerative disc disease), thoracic 02/28/2017  . Ataxia 05/18/2016  . Vestibular disequilibrium 05/18/2016  . Lung nodule, solitary 05/24/2014  . Peripheral edema 06/08/2012  . HYPOTENSION 08/13/2010  . EXTRINSIC ASTHMA, UNSPECIFIED 04/07/2009  . LOW BACK PAIN 04/21/2008  . History of peptic ulcer disease 04/21/2008  . NEPHROLITHIASIS, HX OF 04/21/2008  . Essential hypertension 10/29/2007    Medications- reviewed and updated Current Outpatient Prescriptions  Medication Sig Dispense Refill  . amLODipine (NORVASC) 10 MG tablet Take 10 mg by mouth daily.      . carvedilol (COREG) 25 MG tablet Take 25 mg by mouth 2 (two) times daily with a meal.      . Cholecalciferol (VITAMIN D) 2000 units CAPS Take 1 capsule by mouth daily.    . cloNIDine (CATAPRES) 0.2 MG tablet Take 0.2 mg by mouth at bedtime.     Marland Kitchen HYDROcodone-acetaminophen (NORCO/VICODIN) 5-325 MG tablet Take 1 tablet by mouth every 6 (six) hours as needed. 5 tablet 0  . omeprazole (PRILOSEC) 20 MG capsule Take 20 mg by mouth at bedtime.     . predniSONE (DELTASONE) 5 MG tablet Take 1 tablet (5 mg total) by mouth daily with breakfast. Resume once you have completed a taper of prednisone as instructed     No current facility-administered medications for this visit.     Objective: BP 126/70 (BP  Location: Left Arm, Patient Position: Sitting, Cuff Size: Large)   Pulse 72   Temp 99 F (37.2 C) (Oral)   Ht _0  (1.803 m)   Wt 238 lb 9.6 oz (108.2 kg)   SpO2 94%   BMI 33.28 kg/m  Gen: NAD, resting comfortably CV: RRR no murmurs rubs or gallops Lungs: CTAB no crackles, wheeze, rhonchi Ext: no edema Skin: warm, dry, some bruising lateral edges of both feet. Toes 2-5 there is some mild erythema. He is tender from this area up toward shin on medial side of shin Neuro: Limps when walking due to back pain  Assessment/Plan:  Right foot pain - Plan: DG Foot Complete Right, Sedimentation rate, C-reactive Protein S: 2-3 weeks of pain in the right foot around toes 2-5, bruising on lateral side of both feet that is painful, aalso has pain up the shin onto medial side of the right lower leg. No fall or injury. Never had pain like this before. Has not taken any medicine for this. No puncture or wound. Has not dropped anything on it. No weakness in foot. Walks without difficulty other than his back.   1 aspirin each am. Tends to have bruising on arms pretty easily but usually not tender and has some tenderness in area of feet where tender. Sees rheumatology- next in January 2019. Prednisone 53m. Humira tried- no help for back. Was told potential ankylosing spondylitis.   Has not tried ice or heat. GFR near  50 at baseline A/P: Unclear cause. Ankylosing spondylitis can cause issues with foot pain- will get CRp and ESR. If elevated consider 50m extra prednisone  For 5 days. Will get x-ray to evaluate for fracture or signs of arthritic changes. He was concerned about infection but I see no obvious signs of infection . Thought about shingles but does not follow dermatome distribution- crosses several. Could possibly be lumbar in origin but do not strongly suspect. Try conservative care with icing or heat. Could get into orthopedics for their opinion as well.   Orders Placed This Encounter  Procedures   . DG Foot Complete Right    Standing Status:   Future    Standing Expiration Date:   10/28/2018    Order Specific Question:   Reason for Exam (SYMPTOM  OR DIAGNOSIS REQUIRED)    Answer:   right foot pain- at midfoot. rule out fracture. evaluate arthritis- patient was told potential ankylosing spondylitis    Order Specific Question:   Preferred imaging location?    Answer:   [facility]   Order Specific Question:   Radiology Contrast Protocol - do NOT remove file path    Answer:   [email or web address]  . Sedimentation rate    [place]  . C-reactive Protein   Return precautions advised.  SGarret Reddish MD

## 2017-09-05 ENCOUNTER — Telehealth: Payer: Self-pay

## 2017-09-05 NOTE — Telephone Encounter (Signed)
Called and spoke to patient who stated he had the labs drawn and the x-ray done at the [place]. I asked him to get them to send [name] a copy

## 2017-09-05 NOTE — Telephone Encounter (Signed)
-----   Message from [name], MD sent at 09/02/2017  7:48 AM EDT ----- He did not get his labs or his x-ray. Please encourage him to do so.   Thanks, [name]  ----- Message ----- From: SYSTEM Sent: 09/02/2017  12:06 AM To: [name], MD

## 2017-11-05 ENCOUNTER — Other Ambulatory Visit: Payer: Self-pay

## 2017-11-05 ENCOUNTER — Emergency Department (HOSPITAL_BASED_OUTPATIENT_CLINIC_OR_DEPARTMENT_OTHER): Payer: Medicare Other

## 2017-11-05 ENCOUNTER — Inpatient Hospital Stay (HOSPITAL_BASED_OUTPATIENT_CLINIC_OR_DEPARTMENT_OTHER)
Admission: EM | Admit: 2017-11-05 | Discharge: 2017-11-07 | DRG: 659 | Disposition: A | Discharge status: Home / Self Care | Payer: Medicare Other | Attending: Family Medicine | Admitting: Family Medicine

## 2017-11-05 ENCOUNTER — Encounter (HOSPITAL_BASED_OUTPATIENT_CLINIC_OR_DEPARTMENT_OTHER): Payer: Self-pay

## 2017-11-05 DIAGNOSIS — H905 Unspecified sensorineural hearing loss: Secondary | ICD-10-CM | POA: Diagnosis present

## 2017-11-05 DIAGNOSIS — Z9079 Acquired absence of other genital organ(s): Secondary | ICD-10-CM

## 2017-11-05 DIAGNOSIS — Z7952 Long term (current) use of systemic steroids: Secondary | ICD-10-CM

## 2017-11-05 DIAGNOSIS — R911 Solitary pulmonary nodule: Secondary | ICD-10-CM | POA: Diagnosis not present

## 2017-11-05 DIAGNOSIS — I10 Essential (primary) hypertension: Secondary | ICD-10-CM | POA: Diagnosis present

## 2017-11-05 DIAGNOSIS — J189 Pneumonia, unspecified organism: Secondary | ICD-10-CM

## 2017-11-05 DIAGNOSIS — Z8042 Family history of malignant neoplasm of prostate: Secondary | ICD-10-CM | POA: Diagnosis not present

## 2017-11-05 DIAGNOSIS — R509 Fever, unspecified: Secondary | ICD-10-CM | POA: Diagnosis not present

## 2017-11-05 DIAGNOSIS — Z825 Family history of asthma and other chronic lower respiratory diseases: Secondary | ICD-10-CM | POA: Diagnosis not present

## 2017-11-05 DIAGNOSIS — M459 Ankylosing spondylitis of unspecified sites in spine: Secondary | ICD-10-CM | POA: Diagnosis present

## 2017-11-05 DIAGNOSIS — Z87442 Personal history of urinary calculi: Secondary | ICD-10-CM | POA: Diagnosis not present

## 2017-11-05 DIAGNOSIS — Z8546 Personal history of malignant neoplasm of prostate: Secondary | ICD-10-CM

## 2017-11-05 DIAGNOSIS — N23 Unspecified renal colic: Secondary | ICD-10-CM | POA: Diagnosis not present

## 2017-11-05 DIAGNOSIS — Z85830 Personal history of malignant neoplasm of bone: Secondary | ICD-10-CM

## 2017-11-05 DIAGNOSIS — R8281 Pyuria: Secondary | ICD-10-CM | POA: Diagnosis present

## 2017-11-05 DIAGNOSIS — N201 Calculus of ureter: Secondary | ICD-10-CM | POA: Diagnosis not present

## 2017-11-05 DIAGNOSIS — N200 Calculus of kidney: Secondary | ICD-10-CM | POA: Diagnosis not present

## 2017-11-05 DIAGNOSIS — N390 Urinary tract infection, site not specified: Secondary | ICD-10-CM | POA: Diagnosis not present

## 2017-11-05 DIAGNOSIS — J181 Lobar pneumonia, unspecified organism: Secondary | ICD-10-CM

## 2017-11-05 DIAGNOSIS — J129 Viral pneumonia, unspecified: Secondary | ICD-10-CM | POA: Diagnosis not present

## 2017-11-05 DIAGNOSIS — G8929 Other chronic pain: Secondary | ICD-10-CM | POA: Diagnosis present

## 2017-11-05 DIAGNOSIS — Z87891 Personal history of nicotine dependence: Secondary | ICD-10-CM | POA: Diagnosis not present

## 2017-11-05 DIAGNOSIS — N202 Calculus of kidney with calculus of ureter: Secondary | ICD-10-CM | POA: Diagnosis present

## 2017-11-05 DIAGNOSIS — E785 Hyperlipidemia, unspecified: Secondary | ICD-10-CM | POA: Diagnosis present

## 2017-11-05 HISTORY — DX: Pneumonia, unspecified organism: J18.9

## 2017-11-05 LAB — CBC WITH DIFFERENTIAL/PLATELET
Basophils Absolute: 0 10*3/uL (ref 0.0–0.1)
Basophils Relative: 0 %
Eosinophils Absolute: 0.1 10*3/uL (ref 0.0–0.7)
Eosinophils Relative: 0 %
HCT: 44.5 % (ref 39.0–52.0)
Hemoglobin: 14.9 g/dL (ref 13.0–17.0)
Lymphocytes Relative: 8 %
Lymphs Abs: 1.1 10*3/uL (ref 0.7–4.0)
MCH: 29.2 pg (ref 26.0–34.0)
MCHC: 33.5 g/dL (ref 30.0–36.0)
MCV: 87.3 fL (ref 78.0–100.0)
Monocytes Absolute: 1 10*3/uL (ref 0.1–1.0)
Monocytes Relative: 7 %
Neutro Abs: 11.3 10*3/uL — ABNORMAL HIGH (ref 1.7–7.7)
Neutrophils Relative %: 85 %
Platelets: 197 10*3/uL (ref 150–400)
RBC: 5.1 MIL/uL (ref 4.22–5.81)
RDW: 13.6 % (ref 11.5–15.5)
WBC: 13.5 10*3/uL — ABNORMAL HIGH (ref 4.0–10.5)

## 2017-11-05 LAB — COMPREHENSIVE METABOLIC PANEL
ALT: 31 U/L (ref 17–63)
AST: 27 U/L (ref 15–41)
Albumin: 3.6 g/dL (ref 3.5–5.0)
Alkaline Phosphatase: 72 U/L (ref 38–126)
Anion gap: 7 (ref 5–15)
BUN: 23 mg/dL — ABNORMAL HIGH (ref 6–20)
CO2: 26 mmol/L (ref 22–32)
Calcium: 8.6 mg/dL — ABNORMAL LOW (ref 8.9–10.3)
Chloride: 107 mmol/L (ref 101–111)
Creatinine, Ser: 1.41 mg/dL — ABNORMAL HIGH (ref 0.61–1.24)
GFR calc Af Amer: 56 mL/min — ABNORMAL LOW (ref >60)
GFR calc non Af Amer: 49 mL/min — ABNORMAL LOW (ref >60)
Glucose, Bld: 117 mg/dL — ABNORMAL HIGH (ref 65–99)
Potassium: 3.5 mmol/L (ref 3.5–5.1)
Sodium: 140 mmol/L (ref 135–145)
Total Bilirubin: 1.3 mg/dL — ABNORMAL HIGH (ref 0.3–1.2)
Total Protein: 6.4 g/dL — ABNORMAL LOW (ref 6.5–8.1)

## 2017-11-05 LAB — URINALYSIS, MICROSCOPIC (REFLEX)

## 2017-11-05 LAB — URINALYSIS, ROUTINE W REFLEX MICROSCOPIC
Bilirubin Urine: NEGATIVE
Glucose, UA: NEGATIVE mg/dL
Ketones, ur: NEGATIVE mg/dL
Leukocytes, UA: NEGATIVE
Nitrite: NEGATIVE
Protein, ur: NEGATIVE mg/dL
Specific Gravity, Urine: 1.025 (ref 1.005–1.030)
pH: 5.5 (ref 5.0–8.0)

## 2017-11-05 LAB — INFLUENZA PANEL BY PCR (TYPE A & B)
Influenza A By PCR: NEGATIVE
Influenza B By PCR: NEGATIVE

## 2017-11-05 MED ORDER — MORPHINE SULFATE (PF) 4 MG/ML IV SOLN
2.0000 mg | INTRAVENOUS | Status: DC | PRN
  Administered 2017-11-06: 2 mg via INTRAVENOUS
  Filled 2017-11-05 (×3): qty 1

## 2017-11-05 MED ORDER — ONDANSETRON HCL 4 MG PO TABS: 4.0000 mg | ORAL_TABLET | Freq: Four times a day (QID) | ORAL | Status: DC | PRN

## 2017-11-05 MED ORDER — AZITHROMYCIN 500 MG IV SOLR
INTRAVENOUS | Status: AC
  Administered 2017-11-05: 12:00:00
  Filled 2017-11-05: qty 500

## 2017-11-05 MED ORDER — AMLODIPINE BESYLATE 10 MG PO TABS
10.0000 mg | ORAL_TABLET | Freq: Every day | ORAL | Status: DC
  Administered 2017-11-06: 10 mg via ORAL
  Filled 2017-11-05 (×2): qty 1

## 2017-11-05 MED ORDER — PANTOPRAZOLE SODIUM 40 MG PO TBEC
40.0000 mg | DELAYED_RELEASE_TABLET | Freq: Every day | ORAL | Status: DC
  Administered 2017-11-05 – 2017-11-06 (×2): 40 mg via ORAL
  Filled 2017-11-05 (×3): qty 1

## 2017-11-05 MED ORDER — DEXTROSE 5 % IV SOLN
500.0000 mg | Freq: Once | INTRAVENOUS | Status: AC
  Administered 2017-11-05: 500 mg via INTRAVENOUS
  Filled 2017-11-05: qty 500

## 2017-11-05 MED ORDER — DEXTROSE 5 % IV SOLN
1.0000 g | INTRAVENOUS | Status: DC
  Administered 2017-11-06 – 2017-11-07 (×2): 1 g via INTRAVENOUS
  Filled 2017-11-05 (×2): qty 10

## 2017-11-05 MED ORDER — DEXTROSE 5 % IV SOLN
500.0000 mg | INTRAVENOUS | Status: DC
  Administered 2017-11-06: 500 mg via INTRAVENOUS
  Filled 2017-11-05 (×2): qty 500

## 2017-11-05 MED ORDER — ACETAMINOPHEN 650 MG RE SUPP: 650.0000 mg | Freq: Four times a day (QID) | RECTAL | Status: DC | PRN

## 2017-11-05 MED ORDER — DEXTROSE 5 % IV SOLN
1.0000 g | Freq: Once | INTRAVENOUS | Status: AC
  Administered 2017-11-05: 1 g via INTRAVENOUS
  Filled 2017-11-05: qty 10

## 2017-11-05 MED ORDER — ACETAMINOPHEN 325 MG PO TABS: 650.0000 mg | ORAL_TABLET | Freq: Four times a day (QID) | ORAL | Status: DC | PRN

## 2017-11-05 MED ORDER — ONDANSETRON HCL 4 MG/2ML IJ SOLN: 4.0000 mg | Freq: Four times a day (QID) | INTRAMUSCULAR | Status: DC | PRN

## 2017-11-05 MED ORDER — TAMSULOSIN HCL 0.4 MG PO CAPS
0.4000 mg | ORAL_CAPSULE | Freq: Every day | ORAL | Status: DC
Start: 2017-11-05 — End: 2017-11-07
  Administered 2017-11-05 – 2017-11-07 (×3): 0.4 mg via ORAL
  Filled 2017-11-05 (×3): qty 1

## 2017-11-05 MED ORDER — ISOSORBIDE MONONITRATE ER 30 MG PO TB24
30.0000 mg | ORAL_TABLET | Freq: Every day | ORAL | Status: DC
  Administered 2017-11-05 – 2017-11-06 (×2): 30 mg via ORAL
  Filled 2017-11-05 (×3): qty 1

## 2017-11-05 MED ORDER — ATORVASTATIN CALCIUM 20 MG PO TABS
20.0000 mg | ORAL_TABLET | Freq: Every day | ORAL | Status: DC
  Administered 2017-11-05 – 2017-11-06 (×2): 20 mg via ORAL
  Filled 2017-11-05 (×3): qty 1

## 2017-11-05 MED ORDER — ONDANSETRON HCL 4 MG/2ML IJ SOLN
4.0000 mg | Freq: Once | INTRAMUSCULAR | Status: AC
  Administered 2017-11-05: 4 mg via INTRAVENOUS
  Filled 2017-11-05: qty 2

## 2017-11-05 MED ORDER — SODIUM CHLORIDE 0.9 % IV SOLN
INTRAVENOUS | Status: AC
  Administered 2017-11-05: 20:00:00 via INTRAVENOUS

## 2017-11-05 MED ORDER — CARVEDILOL 25 MG PO TABS
25.0000 mg | ORAL_TABLET | Freq: Two times a day (BID) | ORAL | Status: DC
  Administered 2017-11-06 – 2017-11-07 (×3): 25 mg via ORAL
  Filled 2017-11-05 (×3): qty 1

## 2017-11-05 MED ORDER — PREDNISONE 5 MG PO TABS
5.0000 mg | ORAL_TABLET | Freq: Every day | ORAL | Status: DC
  Administered 2017-11-06 – 2017-11-07 (×2): 5 mg via ORAL
  Filled 2017-11-05 (×2): qty 1

## 2017-11-05 MED ORDER — MORPHINE SULFATE (PF) 2 MG/ML IV SOLN
2.0000 mg | Freq: Once | INTRAVENOUS | Status: AC
  Administered 2017-11-05: 2 mg via INTRAVENOUS
  Filled 2017-11-05: qty 1

## 2017-11-05 NOTE — ED Notes (Signed)
ED Provider at bedside. 

## 2017-11-05 NOTE — ED Notes (Signed)
Tried to call report to RN. She stated she would call me back to get report.

## 2017-11-05 NOTE — ED Notes (Signed)
Report given to Legrand Como, RN [facility]. ETA 15 mins.

## 2017-11-05 NOTE — ED Notes (Signed)
Pt transported to CT at this time.

## 2017-11-05 NOTE — ED Triage Notes (Signed)
Woke up with chills and diminished urine stream. Also reports right flank pain.

## 2017-11-05 NOTE — Progress Notes (Signed)
Per Dr. Roel Cluck, ok to give patient sips of water with meds tonight.

## 2017-11-05 NOTE — Plan of Care (Signed)
Discussed case with Dr. Lita Mains. Pt is a 71yo male with hx of prior renal stones requiring stenting presents with R flank pain and chills. Pt found to have 58mm distal ureteral stone. Pt presented febrile with WBC of 13k. Pt with possible infiltrate on CXR concerning for PNA. Patient started on empiric azithro and rocephin. Urology aware of patient as has asked to be paged when patient arrives to [facility]. Per EDP, patient is medically stable for med-surg bed. Will accept pt to med-surg at [facility].

## 2017-11-05 NOTE — ED Notes (Signed)
Pt and family made aware of NPO status.

## 2017-11-05 NOTE — ED Notes (Signed)
Pt transported to XR at this time via wheelchair.

## 2017-11-05 NOTE — Consult Note (Signed)
Urology Consult   Physician requesting consult: Dr. Marylu Lund  Reason for consult: Right ureteral stone and fever  History of Present Illness: Bruce Ball is a 71 y.o. who presented to the [place] ED today with fever and chills and right abdominal pain.  He had associated nausea.   He has a history of stones and had been having intermittent back pain and hematuria for a couple of weeks. His UA today was leukocyte esterase negative and nitrite negative but his microscopic exam demonstrated many bacteria consistent with a UTI.  He has a history of prostate cancer s/p radical prostatectomy by Dr. Serita Butcher.   He is now followed by Dr. Reece Agar at [facility] and his PSA has been negative.  He was noted to have a fever to 100 earlier today.  His CXR demonstrate right sided pneumonia.  He is on treatment with azithromycin and ceftriaxone.    He denies a history of voiding or storage urinary symptoms, hematuria, UTIs, STDs, urolithiasis, GU malignancy/trauma/surgery.  Past Medical History:  Diagnosis Date  . Extrinsic asthma, unspecified 04/07/2009  . HYPERTENSION 10/29/2007  . HYPOTENSION 08/13/2010  . LOW BACK PAIN 04/21/2008  . NEPHROLITHIASIS, HX OF 04/21/2008  . PUD, HX OF 04/21/2008    Past Surgical History:  Procedure Laterality Date  . bone cancer     rt femur removed and steel rod placed  . prosatectomy      Current Hospital Medications:  Home Meds:  Current Meds  Medication Sig  . HYDROcodone-acetaminophen (NORCO/VICODIN) 5-325 MG tablet Take 1 tablet by mouth every 6 (six) hours as needed.    Scheduled Meds: . tamsulosin  0.4 mg Oral Daily   Continuous Infusions: . [START ON 11/06/2017] azithromycin (ZITHROMAX) 500 MG IVPB    . [START ON 11/06/2017] cefTRIAXone (ROCEPHIN)  IV     PRN Meds:.  Allergies: No Known Allergies  Family History  Problem Relation Age of Onset  . COPD Father   . Cancer Other        prostate    Social History:   reports that he quit smoking about 38 years ago. he has never used smokeless tobacco. He reports that he does not drink alcohol or use drugs.  ROS: A complete review of systems was performed.  All systems are negative except for pertinent findings as noted.  Physical Exam:  Vital signs in last 24 hours: Temp:  [98.3 F (36.8 C)-100.2 F (37.9 C)] 98.3 F (36.8 C) (11/07 1826) Pulse Rate:  [58-70] 58 (11/07 1826) Resp:  [14-18] 18 (11/07 1826) BP: (124-147)/(60-99) 137/60 (11/07 1826) SpO2:  [95 %-97 %] 97 % (11/07 1826) Weight:  [107.4 kg (236 lb 12.4 oz)] 107.4 kg (236 lb 12.4 oz) (11/07 1826) Constitutional:  Alert and oriented, No acute distress Cardiovascular: Regular rate and rhythm, No JVD Respiratory: Normal respiratory effort, Lungs clear bilaterally GI: Abdomen is soft, no CVAT, moderate right lower quadrant pain GU: No CVA tenderness Lymphatic: No lymphadenopathy Neurologic: Grossly intact, no focal deficits Psychiatric: Normal mood and affect  Laboratory Data:  Recent Labs    11/05/17 0907  WBC 13.5*  HGB 14.9  HCT 44.5  PLT 197    Recent Labs    11/05/17 0907  NA 140  K 3.5  CL 107  GLUCOSE 117*  BUN 23*  CALCIUM 8.6*  CREATININE 1.41*     Results for orders placed or performed during the hospital encounter of 11/05/17 (from the past 24 hour(s))  CBC  with Differential/Platelet     Status: Abnormal   Collection Time: 11/05/17  9:07 AM  Result Value Ref Range   WBC 13.5 (H) 4.0 - 10.5 K/uL   RBC 5.10 4.22 - 5.81 MIL/uL   Hemoglobin 14.9 13.0 - 17.0 g/dL   HCT 44.5 39.0 - 52.0 %   MCV 87.3 78.0 - 100.0 fL   MCH 29.2 26.0 - 34.0 pg   MCHC 33.5 30.0 - 36.0 g/dL   RDW 13.6 11.5 - 15.5 %   Platelets 197 150 - 400 K/uL   Neutrophils Relative % 85 %   Neutro Abs 11.3 (H) 1.7 - 7.7 K/uL   Lymphocytes Relative 8 %   Lymphs Abs 1.1 0.7 - 4.0 K/uL   Monocytes Relative 7 %   Monocytes Absolute 1.0 0.1 - 1.0 K/uL   Eosinophils Relative 0 %    Eosinophils Absolute 0.1 0.0 - 0.7 K/uL   Basophils Relative 0 %   Basophils Absolute 0.0 0.0 - 0.1 K/uL  Comprehensive metabolic panel     Status: Abnormal   Collection Time: 11/05/17  9:07 AM  Result Value Ref Range   Sodium 140 135 - 145 mmol/L   Potassium 3.5 3.5 - 5.1 mmol/L   Chloride 107 101 - 111 mmol/L   CO2 26 22 - 32 mmol/L   Glucose, Bld 117 (H) 65 - 99 mg/dL   BUN 23 (H) 6 - 20 mg/dL   Creatinine, Ser 1.41 (H) 0.61 - 1.24 mg/dL   Calcium 8.6 (L) 8.9 - 10.3 mg/dL   Total Protein 6.4 (L) 6.5 - 8.1 g/dL   Albumin 3.6 3.5 - 5.0 g/dL   AST 27 15 - 41 U/L   ALT 31 17 - 63 U/L   Alkaline Phosphatase 72 38 - 126 U/L   Total Bilirubin 1.3 (H) 0.3 - 1.2 mg/dL   GFR calc non Af Amer 49 (L) >60 mL/min   GFR calc Af Amer 56 (L) >60 mL/min   Anion gap 7 5 - 15  Urinalysis, Routine w reflex microscopic     Status: Abnormal   Collection Time: 11/05/17 10:10 AM  Result Value Ref Range   Color, Urine YELLOW YELLOW   APPearance CLEAR CLEAR   Specific Gravity, Urine 1.025 1.005 - 1.030   pH 5.5 5.0 - 8.0   Glucose, UA NEGATIVE NEGATIVE mg/dL   Hgb urine dipstick LARGE (A) NEGATIVE   Bilirubin Urine NEGATIVE NEGATIVE   Ketones, ur NEGATIVE NEGATIVE mg/dL   Protein, ur NEGATIVE NEGATIVE mg/dL   Nitrite NEGATIVE NEGATIVE   Leukocytes, UA NEGATIVE NEGATIVE  Urinalysis, Microscopic (reflex)     Status: Abnormal   Collection Time: 11/05/17 10:10 AM  Result Value Ref Range   RBC / HPF TOO NUMEROUS TO COUNT 0 - 5 RBC/hpf   WBC, UA 0-5 0 - 5 WBC/hpf   Bacteria, UA MANY (A) NONE SEEN   Squamous Epithelial / LPF 0-5 (A) NONE SEEN   Ca Oxalate Crys, UA PRESENT    No results found for this or any previous visit (from the past 240 hour(s)).  Renal Function: Recent Labs    11/05/17 0907  CREATININE 1.41*   Estimated Creatinine Clearance: 59.9 mL/min (A) (by C-G formula based on SCr of 1.41 mg/dL (H)).  Radiologic Imaging: Dg Chest 2 View  Result Date: 11/05/2017 CLINICAL DATA:   Fever and right flank pain. EXAM: CHEST  2 VIEW COMPARISON:  04/10/2016. FINDINGS: The heart remains normal in size. The neural stimulator leads  have been removed. Stable left mid lung zone calcified granuloma. Interval patchy opacity in right mid lung zone in the upper lobe on the lateral view with possible involvement of the superior aspect of the right middle lobe. Diffuse bronchitic changes. Thoracic spine degenerative changes including changes of DISH. IMPRESSION: Bronchitic changes, right upper lobe pneumonia and possible small amount of right middle lobe pneumonia. Electronically Signed   By: Claudie Revering M.D.   On: 11/05/2017 10:50   Ct Renal Stone Study  Result Date: 11/05/2017 CLINICAL DATA:  Flank pain.  Evaluate recurrent stone disease. EXAM: CT ABDOMEN AND PELVIS WITHOUT CONTRAST TECHNIQUE: Multidetector CT imaging of the abdomen and pelvis was performed following the standard protocol without IV contrast. COMPARISON:  11/25/2012 FINDINGS: Lower chest: No pleural effusion. Area of ground-glass attenuation and architectural distortion identified in the anteromedial right middle lobe. This is only partially visualized. Hepatobiliary: Small low-density foci within the liver are too small to characterize but likely represent simple cysts. The gallbladder appears normal. No biliary dilatation. Pancreas: Unremarkable. No pancreatic ductal dilatation or surrounding inflammatory changes. Spleen: Normal in size without focal abnormality. Adrenals/Urinary Tract: Right renal calculi measure up to 3 mm, image 39 of series 2. The largest left renal calculus measures 5 mm, image 40 of series 2. No hydronephrosis or hydroureter. Within the distal right ureter there is a tiny stone measuring 2-3 mm, image 82 of series 2. Urinary bladder appears normal. Stomach/Bowel: The stomach appears normal. No pathologic dilatation of the large or small bowel loops. Vascular/Lymphatic: Aortic atherosclerosis. No enlarged upper  abdominal lymph nodes. No pelvic or inguinal adenopathy. Reproductive: Status post prostatectomy. Other: No abdominal wall hernia or abnormality. No abdominopelvic ascites. Musculoskeletal: Previous right hip arthroplasty. Degenerative disc disease noted within the lower thoracic and lumbar spine. IMPRESSION: 1. Bilateral nephrolithiasis. 2. Distal right ureteral calculus measures 2-3 mm. No significant hydronephrosis. 3.  Aortic Atherosclerosis (ICD10-I70.0). Electronically Signed   By: Kerby Moors M.D.   On: 11/05/2017 09:58    I independently reviewed the above imaging studies.  Impression/Recommendation 1) Distal right ureteral stone: He is currently afebrile and does have evidence of pneumonia and had no hydronephrosis on his CT.  I would like to avoid general anesthesia for him if possible considering his pneumonia but if he has not passed his stone overnight or develops a fever again, he will need to proceed with ureteral stent placement.  Will tentatively schedule him for tomorrow with plans for a stent tonight if he develops fever again.  Continue ceftriaxone pending urine cultures.  Will strain urine in case he does pass his small stone tonight. I discussed the potential benefits and risks of the procedure, side effects of the proposed treatment, the likelihood of the patient achieving the goals of the procedure, and any potential problems that might occur during the procedure or recuperation.  He is scheduled for cystoscopy, right ureteral stent placement with possible right ureteroscopic stone removal tomorrow.  Dedria Endres,LES 11/05/2017, 7:33 PM    Pryor Curia MD   CC: Dr. Wyline Copas

## 2017-11-05 NOTE — ED Provider Notes (Signed)
[place] EMERGENCY DEPARTMENT Provider Note   CSN: [id number] Arrival date & time: 11/05/17  9371     History   Chief Complaint Chief Complaint  Patient presents with  . Flank Pain    HPI Bruce Ball is a 71 y.o. male.  HPI Patient with history of chronic back and hip pain as well as history of previous kidney stones presents with right-sided back pain that radiates down to the right hip.  This started this morning.  Associated with chills and nausea.  Has had urinary hesitancy but denies dysuria.  States several weeks ago had gross hematuria but none this morning.  Denies abdominal pain.  No focal weakness or numbness. Past Medical History:  Diagnosis Date  . Extrinsic asthma, unspecified 04/07/2009  . HYPERTENSION 10/29/2007  . HYPOTENSION 08/13/2010  . LOW BACK PAIN 04/21/2008  . NEPHROLITHIASIS, HX OF 04/21/2008  . PUD, HX OF 04/21/2008    Patient Active Problem List   Diagnosis Date Noted  . History of total right hip replacement 03/08/2017  . History of prostate cancer 03/08/2017  . 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 High risk medication use 02/28/2017  . Spondylosis of lumbar region without myelopathy or radiculopathy 02/28/2017  . DDD (degenerative disc disease), thoracic 02/28/2017  . Ataxia 05/18/2016  . Vestibular disequilibrium 05/18/2016  . Ankylosing spondylitis ([place]) 05/24/2014  . Lung nodule, solitary 05/24/2014  . Peripheral edema 06/08/2012  . HYPOTENSION 08/13/2010  . EXTRINSIC ASTHMA, UNSPECIFIED 04/07/2009  . LOW BACK PAIN 04/21/2008  . History of peptic ulcer disease 04/21/2008  . NEPHROLITHIASIS, HX OF 04/21/2008  . Essential hypertension 10/29/2007    Past Surgical History:  Procedure Laterality Date  . bone cancer     rt femur removed and steel rod placed  . prosatectomy         Home Medications    Prior to Admission medications   Medication Sig Start Date End Date Taking? Authorizing Provider  amLODipine (NORVASC)  10 MG tablet Take 10 mg by mouth daily.      [provider]  carvedilol (COREG) 25 MG tablet Take 25 mg by mouth 2 (two) times daily with a meal.      [provider]  Cholecalciferol (VITAMIN D) 2000 units CAPS Take 1 capsule by mouth daily.    [provider]  cloNIDine (CATAPRES) 0.2 MG tablet Take 0.2 mg by mouth at bedtime.     [provider]  HYDROcodone-acetaminophen (NORCO/VICODIN) 5-325 MG tablet Take 1 tablet by mouth every 6 (six) hours as needed. 06/26/16   [name], MD  omeprazole (PRILOSEC) 20 MG capsule Take 20 mg by mouth at bedtime.     [provider]  predniSONE (DELTASONE) 5 MG tablet Take 1 tablet (5 mg total) by mouth daily with breakfast. Resume once you have completed a taper of prednisone as instructed 05/19/16   Ghimire, Henreitta Leber, MD    Family History Family History  Problem Relation Age of Onset  . COPD Father   . Cancer Other        prostate    Social History Social History   Tobacco Use  . Smoking status: Former Smoker    Last attempt to quit: 12/30/1978    Years since quitting: 38.8  . Smokeless tobacco: Never Used  Substance Use Topics  . Alcohol use: No  . Drug use: No     Allergies   Patient has no known allergies.   Review of Systems  Review of Systems  Constitutional: Positive for chills and fatigue. Negative for fever.  Respiratory: Negative for cough and shortness of breath.   Cardiovascular: Negative for chest pain.  Gastrointestinal: Positive for nausea. Negative for abdominal pain, constipation, diarrhea and vomiting.  Genitourinary: Positive for difficulty urinating and flank pain. Negative for penile pain and testicular pain.  Musculoskeletal: Positive for arthralgias, back pain and myalgias. Negative for neck pain.  Skin: Negative for rash and wound.  Neurological: Negative for dizziness, weakness, light-headedness, numbness and headaches.  All other systems reviewed and are  negative.    Physical Exam Updated Vital Signs BP (!) 130/98 (BP Location: Right Arm)   Pulse 70   Temp 99.1 F (37.3 C) (Oral)   Resp 14   SpO2 96%   Physical Exam  Constitutional: He is oriented to person, place, and time. He appears well-developed and well-nourished. No distress.  HENT:  Head: Normocephalic and atraumatic.  Mouth/Throat: Oropharynx is clear and moist. No oropharyngeal exudate.  Eyes: EOM are normal. Pupils are equal, round, and reactive to light.  Neck: Normal range of motion. Neck supple.  Cardiovascular: Normal rate and regular rhythm. Exam reveals no gallop and no friction rub.  No murmur heard. Pulmonary/Chest: Effort normal and breath sounds normal. No stridor. No respiratory distress. He has no wheezes. He has no rales. He exhibits no tenderness.  Abdominal: Soft. Bowel sounds are normal. There is no tenderness. There is no rebound and no guarding.  Musculoskeletal: Normal range of motion. He exhibits tenderness. He exhibits no edema.  Patient has right sided lumbar paraspinal tenderness to palpation.  Tenderness to palpation over the right hip and pain with range of motion of the right hip.  Patient also has tenderness down the lateral surface of the right thigh.  No obvious swelling.  No definite CVA tenderness.  No midline thoracic or lumbar tenderness.  No lower extremity asymmetry.   Lymphadenopathy:    He has no cervical adenopathy.  Neurological: He is alert and oriented to person, place, and time.  Hard of hearing in the right ear.  5/5 motor in all extremities.  Sensation intact.  Skin: Skin is warm and dry. Capillary refill takes less than 2 seconds. No rash noted. No erythema.  Psychiatric: He has a normal mood and affect. His behavior is normal.  Nursing note and vitals reviewed.    ED Treatments / Results  Labs (all labs ordered are listed, but only abnormal results are displayed) Labs Reviewed  CBC WITH DIFFERENTIAL/PLATELET - Abnormal;  Notable for the following components:      Result Value   WBC 13.5 (*)    Neutro Abs 11.3 (*)    All other components within normal limits  COMPREHENSIVE METABOLIC PANEL - Abnormal; Notable for the following components:   Glucose, Bld 117 (*)    BUN 23 (*)    Creatinine, Ser 1.41 (*)    Calcium 8.6 (*)    Total Protein 6.4 (*)    Total Bilirubin 1.3 (*)    GFR calc non Af Amer 49 (*)    GFR calc Af Amer 56 (*)    All other components within normal limits  URINALYSIS, ROUTINE W REFLEX MICROSCOPIC - Abnormal; Notable for the following components:   Hgb urine dipstick LARGE (*)    All other components within normal limits  URINALYSIS, MICROSCOPIC (REFLEX) - Abnormal; Notable for the following components:   Bacteria, UA MANY (*)    Squamous Epithelial / LPF 0-5 (*)  All other components within normal limits  URINE CULTURE    EKG  EKG Interpretation None       Radiology Dg Chest 2 View  Result Date: 11/05/2017 CLINICAL DATA:  Fever and right flank pain. EXAM: CHEST  2 VIEW COMPARISON:  04/10/2016. FINDINGS: The heart remains normal in size. The neural stimulator leads have been removed. Stable left mid lung zone calcified granuloma. Interval patchy opacity in right mid lung zone in the upper lobe on the lateral view with possible involvement of the superior aspect of the right middle lobe. Diffuse bronchitic changes. Thoracic spine degenerative changes including changes of DISH. IMPRESSION: Bronchitic changes, right upper lobe pneumonia and possible small amount of right middle lobe pneumonia. Electronically Signed   By: Claudie Revering M.D.   On: 11/05/2017 10:50   Ct Renal Stone Study  Result Date: 11/05/2017 CLINICAL DATA:  Flank pain.  Evaluate recurrent stone disease. EXAM: CT ABDOMEN AND PELVIS WITHOUT CONTRAST TECHNIQUE: Multidetector CT imaging of the abdomen and pelvis was performed following the standard protocol without IV contrast. COMPARISON:  11/25/2012 FINDINGS: Lower  chest: No pleural effusion. Area of ground-glass attenuation and architectural distortion identified in the anteromedial right middle lobe. This is only partially visualized. Hepatobiliary: Small low-density foci within the liver are too small to characterize but likely represent simple cysts. The gallbladder appears normal. No biliary dilatation. Pancreas: Unremarkable. No pancreatic ductal dilatation or surrounding inflammatory changes. Spleen: Normal in size without focal abnormality. Adrenals/Urinary Tract: Right renal calculi measure up to 3 mm, image 39 of series 2. The largest left renal calculus measures 5 mm, image 40 of series 2. No hydronephrosis or hydroureter. Within the distal right ureter there is a tiny stone measuring 2-3 mm, image 82 of series 2. Urinary bladder appears normal. Stomach/Bowel: The stomach appears normal. No pathologic dilatation of the large or small bowel loops. Vascular/Lymphatic: Aortic atherosclerosis. No enlarged upper abdominal lymph nodes. No pelvic or inguinal adenopathy. Reproductive: Status post prostatectomy. Other: No abdominal wall hernia or abnormality. No abdominopelvic ascites. Musculoskeletal: Previous right hip arthroplasty. Degenerative disc disease noted within the lower thoracic and lumbar spine. IMPRESSION: 1. Bilateral nephrolithiasis. 2. Distal right ureteral calculus measures 2-3 mm. No significant hydronephrosis. 3.  Aortic Atherosclerosis (ICD10-I70.0). Electronically Signed   By: Kerby Moors M.D.   On: 11/05/2017 09:58    Procedures Procedures (including critical care time)  Medications Ordered in ED Medications  cefTRIAXone (ROCEPHIN) 1 g in dextrose 5 % 50 mL IVPB (1 g Intravenous New Bag/Given 11/05/17 1112)  azithromycin (ZITHROMAX) 500 mg in dextrose 5 % 250 mL IVPB (not administered)  morphine 2 MG/ML injection 2 mg (2 mg Intravenous Given 11/05/17 0926)  ondansetron (ZOFRAN) injection 4 mg (4 mg Intravenous Given 11/05/17 0926)      Initial Impression / Assessment and Plan / ED Course  I have reviewed the triage vital signs and the nursing notes.  Pertinent labs & imaging results that were available during my care of the patient were reviewed by me and considered in my medical decision making (see chart for details).    Patient's UA demonstrates negative leukocyte and nitrites.  Many bacteria but no white blood cells.  Serum white blood cell count is elevated.  Pulmonary infiltrate seen on renal CT.  Chest x-ray confirms right upper lobe pneumonia. Consent for culture.  Started on antibiotic treatment for both possible urinary tract infection and pneumonia. Discussed with Dr. Alinda Money.  Advised transfer to [facility] for possible stenting.  Dr. Sherrian Divers will accept patient in transfer and admit.   Final Clinical Impressions(s) / ED Diagnoses   Final diagnoses:  Ureteral colic  Community acquired pneumonia of right upper lobe of lung [facility])    ED Discharge Orders    None       Julianne Rice, MD 11/05/17 1139

## 2017-11-05 NOTE — H&P (Signed)
Bruce Ball MGQ:[id number] DOB: 1946/05/21 DOA: 11/05/2017     PCP: [name], MD  [facility] Outpatient Specialists: [facility], Urology at [facility] Patient coming from: home Lives   With family    Chief Complaint: right flank pain  HPI: Bruce Ball is a 71 y.o. male with medical history significant of Nephrolithiasis, bilateral sensorineuronal hearing loss, HNT,   chronic back pain    Presented with right flank pain associated with chills and decreased urination ever since this morning. Reports pain radiating down to right hip he have had some associated nausea. No dysuria. Reports that 2 weeks ago he had some blood in his urine but not today. It shows similar to  prior kidney stones. Reports no cough, fever got up to 102 at home.  No dyspnea or  wheezing. NO sick contacts. He has chronic generalized aches.  Have not had a flu shot this year yet.      Regarding pertinent Chronic problems: Patient is on chronic prednisone 5 mg a day for chronic back pain.    IN ER:  Temp (24hrs), Avg:99.1 F (37.3 C), Min:98.3 F (36.8 C), Max:100.2 F (37.9 C)      on arrival  ED Triage Vitals  Enc Vitals Group     BP 11/05/17 0853 (!) 147/99     Pulse Rate 11/05/17 0853 68     Resp 11/05/17 0853 16     Temp 11/05/17 0853 100.2 F (37.9 C)     Temp Source 11/05/17 0853 Oral     SpO2 11/05/17 0853 96 %     Weight 11/05/17 1826 236 lb 12.4 oz (107.4 kg)     Height 11/05/17 1826 5\' 11"  (1.803 m)     Head Circumference --      Peak Flow --      Pain Score 11/05/17 0853 8     Pain Loc --      Pain Edu? --      Excl. in [place]? --     Latest RR 18 95%Hr 63 P 132/70 WC 13.5 Hg 14.9 PLT 197 Na 140 3.5 BUN 23 Cr 1.41  CT renal protocol 2 mm distal Ureteral stone and possible pneumonia CXR -PNA right upper lobe right middle lobe PNA  Following Medications were ordered in ER: Medications  morphine 2 MG/ML injection 2 mg (2 mg Intravenous Given 11/05/17 0926)  ondansetron  (ZOFRAN) injection 4 mg (4 mg Intravenous Given 11/05/17 0926)  cefTRIAXone (ROCEPHIN) 1 g in dextrose 5 % 50 mL IVPB (0 g Intravenous Stopped 11/05/17 1156)  azithromycin (ZITHROMAX) 500 mg in dextrose 5 % 250 mL IVPB (0 mg Intravenous Stopped 11/05/17 1310)  azithromycin (ZITHROMAX) 500 MG injection (  Given 11/05/17 1158)     ER provider discussed case with: Urology Dr. Alinda Money Who recommends: Transfer to [place] long on antibiotics We'll see patient in consult on arrival Hospitalist was called for admission for community-acquired pneumonia and ureteral stone with pyuria  Review of Systems:    Pertinent positives include:  Fevers, chills, fatigue,  Constitutional:  No weight loss, night sweats weight loss  HEENT:  No headaches, Difficulty swallowing,Tooth/dental problems,Sore throat,  No sneezing, itching, ear ache, nasal congestion, post nasal drip,  Cardio-vascular:  No chest pain, Orthopnea, PND, anasarca, dizziness, palpitations.no Bilateral lower extremity swelling  GI:  No heartburn, indigestion, abdominal pain, nausea, vomiting, diarrhea, change in bowel habits, loss of appetite, melena, blood in stool, hematemesis Resp:  no shortness of  breath at rest. No dyspnea on exertion, No excess mucus, no productive cough, No non-productive cough, No coughing up of blood.No change in color of mucus.No wheezing. Skin:  no rash or lesions. No jaundice GU:  no dysuria, change in color of urine, no urgency or frequency. No straining to urinate.  No flank pain.  Musculoskeletal:  No joint pain or no joint swelling. No decreased range of motion. No back pain.  Psych:  No change in mood or affect. No depression or anxiety. No memory loss.  Neuro: no localizing neurological complaints, no tingling, no weakness, no double vision, no gait abnormality, no slurred speech, no confusion  As per HPI otherwise 10 point review of systems negative.   Past Medical History: Past Medical History:    Diagnosis Date  . Extrinsic asthma, unspecified 04/07/2009  . HYPERTENSION 10/29/2007  . HYPOTENSION 08/13/2010  . LOW BACK PAIN 04/21/2008  . NEPHROLITHIASIS, HX OF 04/21/2008  . PUD, HX OF 04/21/2008   Past Surgical History:  Procedure Laterality Date  . bone cancer     rt femur removed and steel rod placed  . prosatectomy       Social History:  Ambulatory  independently     reports that he quit smoking about 38 years ago. he has never used smokeless tobacco. He reports that he does not drink alcohol or use drugs.  Allergies:  No Known Allergies     Family History:   Family History  Problem Relation Age of Onset  . COPD Father   . Cancer Other        prostate    Medications: Prior to Admission medications   Medication Sig Start Date End Date Taking? Authorizing Provider  HYDROcodone-acetaminophen (NORCO/VICODIN) 5-325 MG tablet Take 1 tablet by mouth every 6 (six) hours as needed. 06/26/16  Yes [name], MD  amLODipine (NORVASC) 10 MG tablet Take 10 mg by mouth daily.      [provider]  carvedilol (COREG) 25 MG tablet Take 25 mg by mouth 2 (two) times daily with a meal.      [provider]  Cholecalciferol (VITAMIN D) 2000 units CAPS Take 1 capsule by mouth daily.    [provider]  cloNIDine (CATAPRES) 0.2 MG tablet Take 0.2 mg by mouth at bedtime.     [provider]  omeprazole (PRILOSEC) 20 MG capsule Take 20 mg by mouth at bedtime.     [provider]  predniSONE (DELTASONE) 5 MG tablet Take 1 tablet (5 mg total) by mouth daily with breakfast. Resume once you have completed a taper of prednisone as instructed 05/19/16   Jonetta Osgood, MD    Physical Exam: Patient Vitals for the past 24 hrs:  BP Temp Temp src Pulse Resp SpO2 Height Weight  11/05/17 1826 137/60 98.3 F (36.8 C) Oral (!) 58 18 97 % 5\' 11"  (1.803 m) 107.4 kg (236 lb 12.4 oz)  11/05/17 1657 132/70 - - 63 18 95 % - -  11/05/17 1450 124/67  98.6 F (37 C) Oral 65 18 96 % - -  11/05/17 1415 140/82 99.3 F (37.4 C) Oral 65 18 96 % - -  11/05/17 1118 (!) 130/98 99.1 F (37.3 C) Oral 70 14 96 % - -  11/05/17 0853 (!) 147/99 100.2 F (37.9 C) Oral 68 16 96 % - -    1. General:  in No Acute distress  well -appearing 2. Psychological: Alert and Oriented 3. Head/ENT:  Dry Mucous Membranes                          Head Non traumatic, neck supple                          Poor Dentition 4. SKIN:   decreased Skin turgor,  Skin clean Dry and intact no rash 5. Heart: Regular rate and rhythm no Murmur, no Rub or gallop 6. Lungs:  , no wheezes some crackles   7. Abdomen: Soft, non-tender, Non distended obese  bowel sounds present 8. Lower extremities: no clubbing, cyanosis, or edema 9. Neurologically Grossly intact, moving all 4 extremities equally  Mild costovertebral tenderness in the right 10. MSK: Normal range of motion   body mass index is 33.02 kg/m.  Labs on Admission:   Labs on Admission: I have personally reviewed following labs and imaging studies  CBC: Recent Labs  Lab 11/05/17 0907  WBC 13.5*  NEUTROABS 11.3*  HGB 14.9  HCT 44.5  MCV 87.3  PLT 935   Basic Metabolic Panel: Recent Labs  Lab 11/05/17 0907  NA 140  K 3.5  CL 107  CO2 26  GLUCOSE 117*  BUN 23*  CREATININE 1.41*  CALCIUM 8.6*   GFR: Estimated Creatinine Clearance: 59.9 mL/min (A) (by C-G formula based on SCr of 1.41 mg/dL (H)). Liver Function Tests: Recent Labs  Lab 11/05/17 0907  AST 27  ALT 31  ALKPHOS 72  BILITOT 1.3*  PROT 6.4*  ALBUMIN 3.6   No results for input(s): LIPASE, AMYLASE in the last 168 hours. No results for input(s): AMMONIA in the last 168 hours. Coagulation Profile: No results for input(s): INR, PROTIME in the last 168 hours. Cardiac Enzymes: No results for input(s): CKTOTAL, CKMB, CKMBINDEX, TROPONINI in the last 168 hours. BNP (last 3 results) No results for input(s): PROBNP in the last 8760  hours. HbA1C: No results for input(s): HGBA1C in the last 72 hours. CBG: No results for input(s): GLUCAP in the last 168 hours. Lipid Profile: No results for input(s): CHOL, HDL, LDLCALC, TRIG, CHOLHDL, LDLDIRECT in the last 72 hours. Thyroid Function Tests: No results for input(s): TSH, T4TOTAL, FREET4, T3FREE, THYROIDAB in the last 72 hours. Anemia Panel: No results for input(s): VITAMINB12, FOLATE, FERRITIN, TIBC, IRON, RETICCTPCT in the last 72 hours. Urine analysis:    Component Value Date/Time   COLORURINE YELLOW 11/05/2017 1010   APPEARANCEUR CLEAR 11/05/2017 1010   LABSPEC 1.025 11/05/2017 1010   PHURINE 5.5 11/05/2017 1010   GLUCOSEU NEGATIVE 11/05/2017 1010   HGBUR LARGE (A) 11/05/2017 1010   HGBUR large 02/26/2010 1214   BILIRUBINUR NEGATIVE 11/05/2017 1010   BILIRUBINUR 1+ 06/26/2016 1117   KETONESUR NEGATIVE 11/05/2017 1010   PROTEINUR NEGATIVE 11/05/2017 1010   UROBILINOGEN 1.0 06/26/2016 1117   UROBILINOGEN 1.0 03/05/2010 2252   NITRITE NEGATIVE 11/05/2017 1010   LEUKOCYTESUR NEGATIVE 11/05/2017 1010   Sepsis Labs: @LABRCNTIP (procalcitonin:4,lacticidven:4) )No results found for this or any previous visit (from the past 240 hour(s)).     UA pyuria noted  Lab Results  Component Value Date   HGBA1C 5.5 05/18/2016    Estimated Creatinine Clearance: 59.9 mL/min (A) (by C-G formula based on SCr of 1.41 mg/dL (H)).  BNP (last 3 results) No results for input(s): PROBNP in the last 8760 hours.   ECG REPORT Not obtained  [facility] Weights   11/05/17 1826  Weight: 107.4 kg (236 lb  12.4 oz)     Cultures:    Component Value Date/Time   SDES URINE, RANDOM 03/05/2010 2252   SPECREQUEST NONE 03/05/2010 2252   CULT NO GROWTH 03/05/2010 2252   REPTSTATUS 03/07/2010 FINAL 03/05/2010 2252     Radiological Exams on Admission: Dg Chest 2 View  Result Date: 11/05/2017 CLINICAL DATA:  Fever and right flank pain. EXAM: CHEST  2 VIEW COMPARISON:  04/10/2016.  FINDINGS: The heart remains normal in size. The neural stimulator leads have been removed. Stable left mid lung zone calcified granuloma. Interval patchy opacity in right mid lung zone in the upper lobe on the lateral view with possible involvement of the superior aspect of the right middle lobe. Diffuse bronchitic changes. Thoracic spine degenerative changes including changes of DISH. IMPRESSION: Bronchitic changes, right upper lobe pneumonia and possible small amount of right middle lobe pneumonia. Electronically Signed   By: Claudie Revering M.D.   On: 11/05/2017 10:50   Ct Renal Stone Study  Result Date: 11/05/2017 CLINICAL DATA:  Flank pain.  Evaluate recurrent stone disease. EXAM: CT ABDOMEN AND PELVIS WITHOUT CONTRAST TECHNIQUE: Multidetector CT imaging of the abdomen and pelvis was performed following the standard protocol without IV contrast. COMPARISON:  11/25/2012 FINDINGS: Lower chest: No pleural effusion. Area of ground-glass attenuation and architectural distortion identified in the anteromedial right middle lobe. This is only partially visualized. Hepatobiliary: Small low-density foci within the liver are too small to characterize but likely represent simple cysts. The gallbladder appears normal. No biliary dilatation. Pancreas: Unremarkable. No pancreatic ductal dilatation or surrounding inflammatory changes. Spleen: Normal in size without focal abnormality. Adrenals/Urinary Tract: Right renal calculi measure up to 3 mm, image 39 of series 2. The largest left renal calculus measures 5 mm, image 40 of series 2. No hydronephrosis or hydroureter. Within the distal right ureter there is a tiny stone measuring 2-3 mm, image 82 of series 2. Urinary bladder appears normal. Stomach/Bowel: The stomach appears normal. No pathologic dilatation of the large or small bowel loops. Vascular/Lymphatic: Aortic atherosclerosis. No enlarged upper abdominal lymph nodes. No pelvic or inguinal adenopathy. Reproductive:  Status post prostatectomy. Other: No abdominal wall hernia or abnormality. No abdominopelvic ascites. Musculoskeletal: Previous right hip arthroplasty. Degenerative disc disease noted within the lower thoracic and lumbar spine. IMPRESSION: 1. Bilateral nephrolithiasis. 2. Distal right ureteral calculus measures 2-3 mm. No significant hydronephrosis. 3.  Aortic Atherosclerosis (ICD10-I70.0). Electronically Signed   By: Kerby Moors M.D.   On: 11/05/2017 09:58    Chart has been reviewed    Assessment/Plan   71 y.o. male with medical history significant of Nephrolithiasis, bilateral sensorineuronal hearing loss, HNT, , chronic back pain    Admitted for  community-acquired pneumonia and ureteral stone with pyuria    Present on Admission: . Right ureteral stone - appreciate urology input keep nothing by mouth may need to have a procedure if no improvement in the morning stenting it becomes febrile or toxic in the middle at night would need stenting urgently . Pyuria - given febrile illness and a right ureteral stone will treat with IV antibiotics await results of blood and urine culture . Essential hypertension -stable continue home medications . CAP (community acquired pneumonia) - patient haven't been clearly symptomatic but have had some diffuse body aches we'll check for influenza. Covered with Rocephin and azithromycin for community-acquired pneumonia with results of blood and sputum cultures. And history of smoking and atypical presentation of would repeat imaging in 6 weeks to document resolution  Hyperlipidemia  -  continue home medications chronic back pain secondary to ankylosing spondylitis continue prednisone 5 mg a day  Other plan as per orders.  DVT prophylaxis:  SCD    Code Status:  FULL CODE  per patient   Family Communication:   Family at  Bedside  plan of care was discussed with  Wife,    Disposition Plan:    To home once workup is complete and patient is stable                                           Consults called: Urology Dr. Alinda Money   Admission status:   inpatient      Level of care   medical floor        I have spent a total of 66 min on this admission extra time was spent to discuss case with consultants  [place] 11/05/2017, 8:03 PM   [facility]  Pager [phone]   after 2 AM please page floor coverage PA If 7AM-7PM, please contact the day team taking care of the patient  [email or web address]  Password [email or web address]

## 2017-11-06 ENCOUNTER — Inpatient Hospital Stay (HOSPITAL_COMMUNITY): Payer: Medicare Other | Admitting: Anesthesiology

## 2017-11-06 ENCOUNTER — Encounter (HOSPITAL_COMMUNITY)
Admission: EM | Disposition: A | Discharge status: Home / Self Care | Payer: Self-pay | Source: Home / Self Care | Attending: Family Medicine

## 2017-11-06 ENCOUNTER — Inpatient Hospital Stay (HOSPITAL_COMMUNITY): Payer: Medicare Other

## 2017-11-06 ENCOUNTER — Other Ambulatory Visit: Payer: Self-pay

## 2017-11-06 ENCOUNTER — Encounter (HOSPITAL_COMMUNITY): Payer: Self-pay

## 2017-11-06 DIAGNOSIS — N200 Calculus of kidney: Secondary | ICD-10-CM

## 2017-11-06 HISTORY — PX: CYSTOSCOPY WITH RETROGRADE PYELOGRAM, URETEROSCOPY AND STENT PLACEMENT: SHX5789

## 2017-11-06 LAB — TSH: TSH: 1.022 u[IU]/mL (ref 0.350–4.500)

## 2017-11-06 LAB — CBC
HCT: 43.1 % (ref 39.0–52.0)
Hemoglobin: 14.1 g/dL (ref 13.0–17.0)
MCH: 29 pg (ref 26.0–34.0)
MCHC: 32.7 g/dL (ref 30.0–36.0)
MCV: 88.7 fL (ref 78.0–100.0)
Platelets: 168 10*3/uL (ref 150–400)
RBC: 4.86 MIL/uL (ref 4.22–5.81)
RDW: 13.4 % (ref 11.5–15.5)
WBC: 9.1 10*3/uL (ref 4.0–10.5)

## 2017-11-06 LAB — STREP PNEUMONIAE URINARY ANTIGEN: Strep Pneumo Urinary Antigen: NEGATIVE

## 2017-11-06 LAB — SURGICAL PCR SCREEN
MRSA, PCR: NEGATIVE
Staphylococcus aureus: NEGATIVE

## 2017-11-06 LAB — URINE CULTURE: Culture: NO GROWTH

## 2017-11-06 LAB — COMPREHENSIVE METABOLIC PANEL
ALT: 24 U/L (ref 17–63)
AST: 17 U/L (ref 15–41)
Albumin: 3.4 g/dL — ABNORMAL LOW (ref 3.5–5.0)
Alkaline Phosphatase: 61 U/L (ref 38–126)
Anion gap: 8 (ref 5–15)
BUN: 21 mg/dL — ABNORMAL HIGH (ref 6–20)
CO2: 25 mmol/L (ref 22–32)
Calcium: 8.4 mg/dL — ABNORMAL LOW (ref 8.9–10.3)
Chloride: 107 mmol/L (ref 101–111)
Creatinine, Ser: 1.24 mg/dL (ref 0.61–1.24)
GFR calc Af Amer: >60 mL/min (ref >60)
GFR calc non Af Amer: 57 mL/min — ABNORMAL LOW (ref >60)
Glucose, Bld: 96 mg/dL (ref 65–99)
Potassium: 3.5 mmol/L (ref 3.5–5.1)
Sodium: 140 mmol/L (ref 135–145)
Total Bilirubin: 2.1 mg/dL — ABNORMAL HIGH (ref 0.3–1.2)
Total Protein: 6.1 g/dL — ABNORMAL LOW (ref 6.5–8.1)

## 2017-11-06 LAB — HIV ANTIBODY (ROUTINE TESTING W REFLEX): HIV Screen 4th Generation wRfx: NONREACTIVE

## 2017-11-06 LAB — MAGNESIUM: Magnesium: 1.9 mg/dL (ref 1.7–2.4)

## 2017-11-06 LAB — PHOSPHORUS: Phosphorus: 2.6 mg/dL (ref 2.5–4.6)

## 2017-11-06 LAB — PROCALCITONIN: Procalcitonin: 0.1 ng/mL

## 2017-11-06 SURGERY — CYSTOURETEROSCOPY, WITH RETROGRADE PYELOGRAM AND STENT INSERTION
Anesthesia: General | Laterality: Right

## 2017-11-06 MED ORDER — 0.9 % SODIUM CHLORIDE (POUR BTL) OPTIME
TOPICAL | Status: DC | PRN
  Administered 2017-11-06: 1000 mL

## 2017-11-06 MED ORDER — HYDROMORPHONE HCL 1 MG/ML IJ SOLN
0.2500 mg | INTRAMUSCULAR | Status: DC | PRN
  Administered 2017-11-06 (×2): 0.5 mg via INTRAVENOUS

## 2017-11-06 MED ORDER — ONDANSETRON HCL 4 MG/2ML IJ SOLN
INTRAMUSCULAR | Status: AC
  Filled 2017-11-06: qty 2

## 2017-11-06 MED ORDER — FENTANYL CITRATE (PF) 100 MCG/2ML IJ SOLN
INTRAMUSCULAR | Status: DC | PRN
  Administered 2017-11-06 (×2): 50 ug via INTRAVENOUS

## 2017-11-06 MED ORDER — HYDROMORPHONE HCL 1 MG/ML IJ SOLN
INTRAMUSCULAR | Status: AC
  Filled 2017-11-06: qty 1

## 2017-11-06 MED ORDER — SODIUM CHLORIDE 0.9 % IR SOLN
Status: DC | PRN
  Administered 2017-11-06: 3000 mL

## 2017-11-06 MED ORDER — PROPOFOL 10 MG/ML IV BOLUS
INTRAVENOUS | Status: AC
  Filled 2017-11-06: qty 20

## 2017-11-06 MED ORDER — LIDOCAINE 2% (20 MG/ML) 5 ML SYRINGE
INTRAMUSCULAR | Status: AC
  Filled 2017-11-06: qty 5

## 2017-11-06 MED ORDER — LACTATED RINGERS IV SOLN
INTRAVENOUS | Status: DC
  Administered 2017-11-06: 1000 mL via INTRAVENOUS

## 2017-11-06 MED ORDER — ONDANSETRON HCL 4 MG/2ML IJ SOLN
INTRAMUSCULAR | Status: DC | PRN
  Administered 2017-11-06: 4 mg via INTRAVENOUS

## 2017-11-06 MED ORDER — LIDOCAINE 2% (20 MG/ML) 5 ML SYRINGE
INTRAMUSCULAR | Status: DC | PRN
  Administered 2017-11-06: 100 mg via INTRAVENOUS

## 2017-11-06 MED ORDER — PROPOFOL 10 MG/ML IV BOLUS
INTRAVENOUS | Status: DC | PRN
Start: 2017-11-06 — End: 2017-11-06
  Administered 2017-11-06: 160 mg via INTRAVENOUS

## 2017-11-06 MED ORDER — DEXAMETHASONE SODIUM PHOSPHATE 10 MG/ML IJ SOLN
INTRAMUSCULAR | Status: AC
Start: 2017-11-06 — End: ?
  Filled 2017-11-06: qty 1

## 2017-11-06 MED ORDER — SODIUM CHLORIDE 0.9 % IV SOLN
INTRAVENOUS | Status: AC
  Administered 2017-11-06 (×2): via INTRAVENOUS

## 2017-11-06 MED ORDER — FENTANYL CITRATE (PF) 100 MCG/2ML IJ SOLN
INTRAMUSCULAR | Status: AC
  Filled 2017-11-06: qty 2

## 2017-11-06 SURGICAL SUPPLY — 20 items
BAG URO CATCHER STRL LF (MISCELLANEOUS) ×2 IMPLANT
BASKET ZERO TIP NITINOL 2.4FR (BASKET) IMPLANT
BSKT STON RTRVL ZERO TP 2.4FR (BASKET)
CATH INTERMIT  6FR 70CM (CATHETERS) IMPLANT
CLOTH BEACON ORANGE TIMEOUT ST (SAFETY) ×2 IMPLANT
COVER FOOTSWITCH UNIV (MISCELLANEOUS) IMPLANT
COVER SURGICAL LIGHT HANDLE (MISCELLANEOUS) ×1 IMPLANT
FIBER LASER FLEXIVA 365 (UROLOGICAL SUPPLIES) IMPLANT
FIBER LASER TRAC TIP (UROLOGICAL SUPPLIES) IMPLANT
GLOVE BIOGEL M STRL SZ7.5 (GLOVE) ×2 IMPLANT
GOWN STRL REUS W/TWL LRG LVL3 (GOWN DISPOSABLE) ×4 IMPLANT
GUIDEWIRE ANG ZIPWIRE 038X150 (WIRE) IMPLANT
GUIDEWIRE STR DUAL SENSOR (WIRE) ×2 IMPLANT
IV NS 1000ML (IV SOLUTION)
IV NS 1000ML BAXH (IV SOLUTION) ×1 IMPLANT
MANIFOLD NEPTUNE II (INSTRUMENTS) ×2 IMPLANT
PACK CYSTO (CUSTOM PROCEDURE TRAY) ×2 IMPLANT
SHEATH URETERAL 12FRX35CM (MISCELLANEOUS) IMPLANT
STENT URET 6FRX24 CONTOUR (STENTS) ×1 IMPLANT
TUBING CONNECTING 10 (TUBING) ×2 IMPLANT

## 2017-11-06 NOTE — Progress Notes (Signed)
Patient ID: Bruce Ball, male   DOB: [birth year], 71 y.o.   MRN: [id number]    Subjective: Pt still with right flank and abdominal pain this morning.  He has not passed his stone.  Denies SOB, cough and has not required oxygen.  Objective: Vital signs in last 24 hours: Temp:  [98.3 F (36.8 C)-100.2 F (37.9 C)] 99.4 F (37.4 C) (11/08 0319) Pulse Rate:  [58-80] 80 (11/08 0319) Resp:  [14-18] 16 (11/08 0319) BP: (124-167)/(56-99) 128/56 (11/08 0319) SpO2:  [93 %-97 %] 95 % (11/08 0319) Weight:  [107.4 kg (236 lb 12.4 oz)] 107.4 kg (236 lb 12.4 oz) (11/07 1826)  Intake/Output from previous day: 11/07 0701 - 11/08 0700 In: 741.3 [P.O.:15; I.V.:726.3] Out: 350 [Urine:350] Intake/Output this shift: Total I/O In: 741.3 [P.O.:15; I.V.:726.3] Out: 350 [Urine:350]  Physical Exam:  General: Alert and oriented CV: RRR Lungs: Clear Abd: Moderate right CVAT and right LQ tenderness  Lab Results: Recent Labs    11/05/17 0907 11/06/17 0521  HGB 14.9 14.1  HCT 44.5 43.1   CBC Latest Ref Rng & Units 11/06/2017 11/05/2017 06/26/2016  WBC 4.0 - 10.5 K/uL 9.1 13.5(H) 8.4  Hemoglobin 13.0 - 17.0 g/dL 14.1 14.9 15.4  Hematocrit 39.0 - 52.0 % 43.1 44.5 46.5  Platelets 150 - 400 K/uL 168 197 224.0     BMET Recent Labs    11/05/17 0907 11/06/17 0521  NA 140 140  K 3.5 3.5  CL 107 107  CO2 26 25  GLUCOSE 117* 96  BUN 23* 21*  CREATININE 1.41* 1.24  CALCIUM 8.6* 8.4*     Studies/Results:   Assessment/Plan: 1) Right ureteral stone / UTI: Will plan to proceed to OR today for ureteral stent placement and possible stone removal.  Pt has really no pulmonary symptoms or compromise and would appear to be an acceptable risk for anesthesia especially considering the short duration of the planned procedure.  He wishes to proceed.  Will plan for cysto/right ureteral stent placement and possible ureteroscopic stone removal this afternoon.  Continue NPO.  Continue ceftriaxone pending  cultures.   LOS: 1 day   Reymond Maynez,LES 11/06/2017, 6:56 AM

## 2017-11-06 NOTE — Progress Notes (Signed)
PHARMACY - PHYSICIAN COMMUNICATION CRITICAL VALUE ALERT - BLOOD CULTURE IDENTIFICATION (BCID)  No results found for this or any previous visit.  Name of physician (or Provider) Contacted: Lovey Newcomer  Changes to prescribed antibiotics required: continue current regimen  Royetta Asal, PharmD, BCPS Pager [phone] 11/06/2017 8:34 PM

## 2017-11-06 NOTE — Anesthesia Postprocedure Evaluation (Signed)
Anesthesia Post Note  Patient: Bruce Ball  Procedure(s) Performed: CYSTOSCOPY WITH  STENT PLACEMENT RIGHT (Right )     Patient location during evaluation: PACU Anesthesia Type: General Level of consciousness: awake and alert Pain management: pain level controlled Vital Signs Assessment: post-procedure vital signs reviewed and stable Respiratory status: spontaneous breathing, nonlabored ventilation, respiratory function stable and patient connected to nasal cannula oxygen Cardiovascular status: blood pressure returned to baseline and stable Postop Assessment: no apparent nausea or vomiting Anesthetic complications: no    Last Vitals:  Vitals:   11/06/17 1530 11/06/17 1535  BP: (!) 154/76   Pulse: (!) 58   Resp: 11   Temp:  36.9 C  SpO2: 96%     Last Pain:  Vitals:   11/06/17 1535  TempSrc:   PainSc: 5                  Maleeya Peterkin DAVID

## 2017-11-06 NOTE — Anesthesia Procedure Notes (Signed)
Procedure Name: LMA Insertion Date/Time: 11/06/2017 2:23 PM Performed by: Sharlette Dense, CRNA Patient Re-evaluated:Patient Re-evaluated prior to induction Oxygen Delivery Method: Circle system utilized Preoxygenation: Pre-oxygenation with 100% oxygen Induction Type: IV induction Ventilation: Mask ventilation without difficulty LMA: LMA inserted LMA Size: 5.0 Number of attempts: 2 Placement Confirmation: positive ETCO2 and breath sounds checked- equal and bilateral Tube secured with: Tape Dental Injury: Teeth and Oropharynx as per pre-operative assessment

## 2017-11-06 NOTE — Op Note (Signed)
Preoperative diagnosis:  1. Right ureteral stone and fever   Postoperative diagnosis:  1. Right ureteral stone and fever   Procedure:  1. Cystoscopy 2. Right ureteral stent placement (6 x 24 - no string)  Surgeon: Roxy Horseman, Brooke Bonito. M.D.  Anesthesia: General  Complications: None  EBL: Minimal  Specimens: None  Indication: Bruce Ball is a 71 y.o. patient with a distal right ureteral stone and fever/UTI. After reviewing the management options for treatment, he elected to proceed with the above surgical procedure(s). We have discussed the potential benefits and risks of the procedure, side effects of the proposed treatment, the likelihood of the patient achieving the goals of the procedure, and any potential problems that might occur during the procedure or recuperation. Informed consent has been obtained.  Description of procedure:  The patient was taken to the operating room and general anesthesia was induced.  The patient was placed in the dorsal lithotomy position, prepped and draped in the usual sterile fashion, and preoperative antibiotics were administered. A preoperative time-out was performed.   Cystourethroscopy was performed.  The patient's urethra was examined and was normal. The bladder was then systematically examined in its entirety. There was no evidence for any bladder tumors, stones, or other mucosal pathology.    A 0.38 sensor guidewire was then advanced up the right ureter into the renal pelvis under fluoroscopic guidance.  The wire was then backloaded through the cystoscope and a ureteral stent was advance over the wire using Seldinger technique.  The stent was positioned appropriately under fluoroscopic and cystoscopic guidance.  The wire was then removed with an adequate stent curl noted in the renal pelvis as well as in the bladder.  The bladder was then emptied and the procedure ended.  The patient appeared to tolerate the procedure well and without  complications.  The patient was able to be awakened and transferred to the recovery unit in satisfactory condition.    Pryor Curia MD

## 2017-11-06 NOTE — Progress Notes (Signed)
Assumed care of patient at this time. Patient is stable with no complaints at this time. Agree with previously documented assessment. Will continue to monitor patient.   

## 2017-11-06 NOTE — Anesthesia Preprocedure Evaluation (Signed)
Anesthesia Evaluation  Patient identified by MRN, date of birth, ID band Patient awake    Reviewed: Allergy & Precautions, NPO status , Patient's Chart, lab work & pertinent test results  Airway Mallampati: I  TM Distance: >3 FB Neck ROM: Full    Dental   Pulmonary former smoker,    Pulmonary exam normal        Cardiovascular hypertension, Pt. on medications Normal cardiovascular exam     Neuro/Psych    GI/Hepatic   Endo/Other    Renal/GU Renal InsufficiencyRenal disease     Musculoskeletal   Abdominal   Peds  Hematology   Anesthesia Other Findings   Reproductive/Obstetrics                             Anesthesia Physical Anesthesia Plan  ASA: III  Anesthesia Plan: General   Post-op Pain Management:    Induction: Intravenous  PONV Risk Score and Plan: 2 and Dexamethasone and Ondansetron  Airway Management Planned: LMA  Additional Equipment:   Intra-op Plan:   Post-operative Plan: Extubation in OR  Informed Consent: I have reviewed the patients History and Physical, chart, labs and discussed the procedure including the risks, benefits and alternatives for the proposed anesthesia with the patient or authorized representative who has indicated his/her understanding and acceptance.     Plan Discussed with: CRNA and Surgeon  Anesthesia Plan Comments:         Anesthesia Quick Evaluation

## 2017-11-06 NOTE — Transfer of Care (Signed)
Immediate Anesthesia Transfer of Care Note  Patient: Bruce Ball  Procedure(s) Performed: CYSTOSCOPY WITH  STENT PLACEMENT RIGHT (Right )  Patient Location: PACU  Anesthesia Type:General  Level of Consciousness: awake  Airway & Oxygen Therapy: Patient Spontanous Breathing and Patient connected to nasal cannula oxygen  Post-op Assessment: Report given to RN and Post -op Vital signs reviewed and stable  Post vital signs: Reviewed and stable  Last Vitals:  Vitals:   11/06/17 1142 11/06/17 1242  BP:  (!) 141/74  Pulse:  74  Resp:  16  Temp: 36.9 C 37.1 C  SpO2:  97%    Last Pain:  Vitals:   11/06/17 1312  TempSrc:   PainSc: 0-No pain      Patients Stated Pain Goal: 5 (62/44/69 5072)  Complications: No apparent anesthesia complications

## 2017-11-06 NOTE — Progress Notes (Signed)
PROGRESS NOTE [facility]   Bruce Ball   VEL:[id number] DOB: 1946-10-25  DOA: 11/05/2017 PCP: [name], MD   Brief Narrative:  Bruce Ball is a 71 y/o male with medical history of nephrolithiasis hypertension and chronic back pain who presented to the emergency department complaining of right flank pain.  Prior to admission patient reported associated symptoms of chills, bloody urine and fever up to 102 at home.  upon ED evaluation, CT with renal stone protocol showed bilateral nephrolithiasis and distal right ureteral calculus measuring 2-3 mm no significant hydronephrosis.  Urology was consulted and patient was admitted working diagnosis of nephrolithiasis  Subjective: Patient seen and examined, continues to have colicky pain waxing and waning.  Denies any chest pain, shortness of breath, cough, palpitations and dizziness.  Does not believe that kidney stone has passed yet.  Assessment & Plan: Bilateral nephrolithiasis, distal right ureteral calculus with concerns of UTI Urology consult appreciated patient scheduled for cystoscopy, stent placement and possible stone removal. Patient was started on IV antibiotic will continue pending cultures. Continue Flomax  Continue IV fluids. Continue to monitor  Possible pneumonia on CXR  Nit clinically presenting as pneumonia, patient with no symptoms, no cough, SOB or chest pain. This incidental finding or likely a viral pneumonia. Will obtain pro-calcitonin if negative will d/c Azithromycin. Rocephin will continue for UTI coverage   HTN  BP stable  Continue home medications  Monitor   Chronic Back pain  On chronic prednisone Stable, continue for now.   DVT prophylaxis: SCD's  Code Status: Full Code Family Communication: Wife at bedside  Disposition Plan: Home when medically stable   Consultants:   Urology   Procedures:   Cystoscopy 11/8  Antimicrobials:  Rocephin 11/7 >>  Azithromycin 11/7 >>    Objective: Vitals:   11/06/17 0703 11/06/17 0733 11/06/17 1058 11/06/17 1142  BP: (!) 149/76     Pulse: 84     Resp: 16     Temp: 99.6 F (37.6 C) 98.4 F (36.9 C) 98.7 F (37.1 C) 98.5 F (36.9 C)  TempSrc: Oral Oral Oral Oral  SpO2: 96%     Weight:      Height:        Intake/Output Summary (Last 24 hours) at 11/06/2017 1234 Last data filed at 11/06/2017 1057 Gross per 24 hour  Intake 741.25 ml  Output 875 ml  Net -133.75 ml   Filed Weights   11/05/17 1826  Weight: 107.4 kg (236 lb 12.4 oz)    Examination:  General exam: Appears calm and comfortable  HEENT: OP moist and clear Respiratory system: Clear to auscultation. No wheezes,crackle or rhonchi Cardiovascular system: S1 & S2 heard, RRR. No JVD, murmurs, rubs or gallops Gastrointestinal system: Obese, soft + CVA tenderness on the R. No guarding  Central nervous system: Alert and oriented. No focal neurological deficits. Extremities: No pedal edema. Skin: No rashes, lesions or ulcers Psychiatry: Judgement and insight appear normal. Mood & affect appropriate.    Data Reviewed: I have personally reviewed following labs and imaging studies  CBC: Recent Labs  Lab 11/05/17 0907 11/06/17 0521  WBC 13.5* 9.1  NEUTROABS 11.3*  --   HGB 14.9 14.1  HCT 44.5 43.1  MCV 87.3 88.7  PLT 197 258   Basic Metabolic Panel: Recent Labs  Lab 11/05/17 0907 11/06/17 0521  NA 140 140  K 3.5 3.5  CL 107 107  CO2 26 25  GLUCOSE 117* 96  BUN 23* 21*  CREATININE 1.41* 1.24  CALCIUM 8.6* 8.4*  MG  --  1.9  PHOS  --  2.6   GFR: Estimated Creatinine Clearance: 68.1 mL/min (by C-G formula based on SCr of 1.24 mg/dL). Liver Function Tests: Recent Labs  Lab 11/05/17 0907 11/06/17 0521  AST 27 17  ALT 31 24  ALKPHOS 72 61  BILITOT 1.3* 2.1*  PROT 6.4* 6.1*  ALBUMIN 3.6 3.4*   No results for input(s): LIPASE, AMYLASE in the last 168 hours. No results for input(s): AMMONIA in the last 168 hours. Coagulation  Profile: No results for input(s): INR, PROTIME in the last 168 hours. Cardiac Enzymes: No results for input(s): CKTOTAL, CKMB, CKMBINDEX, TROPONINI in the last 168 hours. BNP (last 3 results) No results for input(s): PROBNP in the last 8760 hours. HbA1C: No results for input(s): HGBA1C in the last 72 hours. CBG: No results for input(s): GLUCAP in the last 168 hours. Lipid Profile: No results for input(s): CHOL, HDL, LDLCALC, TRIG, CHOLHDL, LDLDIRECT in the last 72 hours. Thyroid Function Tests: Recent Labs    11/06/17 0521  TSH 1.022   Anemia Panel: No results for input(s): VITAMINB12, FOLATE, FERRITIN, TIBC, IRON, RETICCTPCT in the last 72 hours. Sepsis Labs: No results for input(s): PROCALCITON, LATICACIDVEN in the last 168 hours.  Recent Results (from the past 240 hour(s))  Culture, blood (routine x 2) Call MD if unable to obtain prior to antibiotics being given     Status: None (Preliminary result)   Collection Time: 11/05/17  7:18 PM  Result Value Ref Range Status   Specimen Description BLOOD LEFT ANTECUBITAL  Final   Special Requests IN PEDIATRIC BOTTLE Blood Culture adequate volume  Final   Culture   Final    NO GROWTH < 24 HOURS Performed at [facility], [address] [place], [place] [address]    Report Status PENDING  Incomplete  Culture, blood (routine x 2) Call MD if unable to obtain prior to antibiotics being given     Status: None (Preliminary result)   Collection Time: 11/05/17  7:33 PM  Result Value Ref Range Status   Specimen Description BLOOD LEFT ANTECUBITAL  Final   Special Requests IN PEDIATRIC BOTTLE Blood Culture adequate volume  Final   Culture   Final    NO GROWTH < 24 HOURS Performed at [facility], [place] [address], [place], [place] [address]    Report Status PENDING  Incomplete  Surgical PCR screen     Status: None   Collection Time: 11/05/17 10:48 PM  Result Value Ref Range Status   MRSA, PCR NEGATIVE NEGATIVE Final    Staphylococcus aureus NEGATIVE NEGATIVE Final    Comment: (NOTE) The Xpert SA Assay (FDA approved for NASAL specimens in patients 61 years of age and older), is one component of a comprehensive surveillance program. It is not intended to diagnose infection nor to guide or monitor treatment.       Radiology Studies: Dg Chest 2 View  Result Date: 11/05/2017 CLINICAL DATA:  Fever and right flank pain. EXAM: CHEST  2 VIEW COMPARISON:  04/10/2016. FINDINGS: The heart remains normal in size. The neural stimulator leads have been removed. Stable left mid lung zone calcified granuloma. Interval patchy opacity in right mid lung zone in the upper lobe on the lateral view with possible involvement of the superior aspect of the right middle lobe. Diffuse bronchitic changes. Thoracic spine degenerative changes including changes of DISH. IMPRESSION: Bronchitic changes, right upper lobe  pneumonia and possible small amount of right middle lobe pneumonia. Electronically Signed   By: Claudie Revering M.D.   On: 11/05/2017 10:50   Ct Renal Stone Study  Result Date: 11/05/2017 CLINICAL DATA:  Flank pain.  Evaluate recurrent stone disease. EXAM: CT ABDOMEN AND PELVIS WITHOUT CONTRAST TECHNIQUE: Multidetector CT imaging of the abdomen and pelvis was performed following the standard protocol without IV contrast. COMPARISON:  11/25/2012 FINDINGS: Lower chest: No pleural effusion. Area of ground-glass attenuation and architectural distortion identified in the anteromedial right middle lobe. This is only partially visualized. Hepatobiliary: Small low-density foci within the liver are too small to characterize but likely represent simple cysts. The gallbladder appears normal. No biliary dilatation. Pancreas: Unremarkable. No pancreatic ductal dilatation or surrounding inflammatory changes. Spleen: Normal in size without focal abnormality. Adrenals/Urinary Tract: Right renal calculi measure up to 3 mm, image 39 of series 2. The  largest left renal calculus measures 5 mm, image 40 of series 2. No hydronephrosis or hydroureter. Within the distal right ureter there is a tiny stone measuring 2-3 mm, image 82 of series 2. Urinary bladder appears normal. Stomach/Bowel: The stomach appears normal. No pathologic dilatation of the large or small bowel loops. Vascular/Lymphatic: Aortic atherosclerosis. No enlarged upper abdominal lymph nodes. No pelvic or inguinal adenopathy. Reproductive: Status post prostatectomy. Other: No abdominal wall hernia or abnormality. No abdominopelvic ascites. Musculoskeletal: Previous right hip arthroplasty. Degenerative disc disease noted within the lower thoracic and lumbar spine. IMPRESSION: 1. Bilateral nephrolithiasis. 2. Distal right ureteral calculus measures 2-3 mm. No significant hydronephrosis. 3.  Aortic Atherosclerosis (ICD10-I70.0). Electronically Signed   By: Kerby Moors M.D.   On: 11/05/2017 09:58      Scheduled Meds: . amLODipine  10 mg Oral Daily  . atorvastatin  20 mg Oral Daily  . carvedilol  25 mg Oral BID WC  . isosorbide mononitrate  30 mg Oral Daily  . pantoprazole  40 mg Oral Daily  . predniSONE  5 mg Oral Q breakfast  . tamsulosin  0.4 mg Oral Daily   Continuous Infusions: . sodium chloride 75 mL/hr at 11/06/17 1055  . azithromycin (ZITHROMAX) 500 MG IVPB 500 mg (11/06/17 1141)  . cefTRIAXone (ROCEPHIN)  IV Stopped (11/06/17 1142)     LOS: 1 day    Time spent: Total of 25 minutes spent with pt, greater than 50% of which was spent in discussion of  treatment, counseling and coordination of care   Chipper Oman, MD Pager: Text Page via [email or web address]   If 7PM-7AM, please contact night-coverage [email or web address] 11/06/2017, 12:34 PM

## 2017-11-07 DIAGNOSIS — N23 Unspecified renal colic: Secondary | ICD-10-CM

## 2017-11-07 LAB — CBC WITH DIFFERENTIAL/PLATELET
Basophils Absolute: 0 10*3/uL (ref 0.0–0.1)
Basophils Relative: 0 %
Eosinophils Absolute: 0.2 10*3/uL (ref 0.0–0.7)
Eosinophils Relative: 2 %
HCT: 41.4 % (ref 39.0–52.0)
Hemoglobin: 13.8 g/dL (ref 13.0–17.0)
Lymphocytes Relative: 11 %
Lymphs Abs: 0.9 10*3/uL (ref 0.7–4.0)
MCH: 29.2 pg (ref 26.0–34.0)
MCHC: 33.3 g/dL (ref 30.0–36.0)
MCV: 87.7 fL (ref 78.0–100.0)
Monocytes Absolute: 0.9 10*3/uL (ref 0.1–1.0)
Monocytes Relative: 11 %
Neutro Abs: 6.5 10*3/uL (ref 1.7–7.7)
Neutrophils Relative %: 76 %
Platelets: 169 10*3/uL (ref 150–400)
RBC: 4.72 MIL/uL (ref 4.22–5.81)
RDW: 13.1 % (ref 11.5–15.5)
WBC: 8.5 10*3/uL (ref 4.0–10.5)

## 2017-11-07 LAB — CULTURE, BLOOD (ROUTINE X 2): Special Requests: ADEQUATE

## 2017-11-07 LAB — BASIC METABOLIC PANEL
Anion gap: 8 (ref 5–15)
BUN: 17 mg/dL (ref 6–20)
CO2: 24 mmol/L (ref 22–32)
Calcium: 8.2 mg/dL — ABNORMAL LOW (ref 8.9–10.3)
Chloride: 108 mmol/L (ref 101–111)
Creatinine, Ser: 1.24 mg/dL (ref 0.61–1.24)
GFR calc Af Amer: >60 mL/min (ref >60)
GFR calc non Af Amer: 57 mL/min — ABNORMAL LOW (ref >60)
Glucose, Bld: 100 mg/dL — ABNORMAL HIGH (ref 65–99)
Potassium: 3.6 mmol/L (ref 3.5–5.1)
Sodium: 140 mmol/L (ref 135–145)

## 2017-11-07 MED ORDER — LEVOFLOXACIN 500 MG PO TABS: 500.0000 mg | ORAL_TABLET | Freq: Every day | ORAL | 0 refills | Status: DC

## 2017-11-07 MED ORDER — AMOXICILLIN 500 MG PO TABS: 500.0000 mg | ORAL_TABLET | Freq: Two times a day (BID) | ORAL | 0 refills | Status: AC

## 2017-11-07 NOTE — Progress Notes (Signed)
Patient ID: Bruce Ball, male   DOB: 1946-04-05, 71 y.o.   MRN: [id number]  1 Day Post-Op Subjective: Doing well.  Low grade temp to 99.6.  Pain improved.  Urgency of urination.  Objective: Vital signs in last 24 hours: Temp:  [98.4 F (36.9 C)-99.6 F (37.6 C)] 99.6 F (37.6 C) (11/09 0618) Pulse Rate:  [58-76] 64 (11/09 0618) Resp:  [11-18] 18 (11/09 0618) BP: (129-155)/(60-83) 135/66 (11/09 0618) SpO2:  [96 %-100 %] 96 % (11/09 0618)  Intake/Output from previous day: 11/08 0701 - 11/09 0700 In: 583.8 [I.V.:583.8] Out: 1800 [Urine:1800] Intake/Output this shift: Total I/O In: 120 [P.O.:120] Out: -   Physical Exam:  General: Alert and oriented GU: No CVAT  Lab Results: Recent Labs    11/05/17 0907 11/06/17 0521 11/07/17 0525  HGB 14.9 14.1 13.8  HCT 44.5 43.1 41.4   BMET Recent Labs    11/06/17 0521 11/07/17 0525  NA 140 140  K 3.5 3.6  CL 107 108  CO2 25 24  GLUCOSE 96 100*  BUN 21* 17  CREATININE 1.24 1.24  CALCIUM 8.4* 8.2*     Studies/Results: Urine culture: Negative  Assessment/Plan: Right ureteral stone and fever s/p right ureteral stent: Although his urine culture is negative, I would favor treating him empirically considering his obstructing stone and UA results in the ED.  Amoxicillin won't cover him for his presumed UTI.  Will write him an Rx for levofloxacin for one week.  He will need definitive treatment for his stone and will arrange for ureteroscopy stone removal but will have him f/u as an outpatient first to ensure his urine is clear.  [place] for discharge from urologic standpoint.   LOS: 2 days   Shaleka Brines,LES 11/07/2017, 11:50 AM

## 2017-11-07 NOTE — Progress Notes (Addendum)
PROGRESS NOTE [facility]   Bruce Ball   IRJ:[id number] DOB: 03-27-1946  DOA: 11/05/2017 PCP: [name], MD   Brief Narrative:  Bruce Ball is a 71 y/o male with medical history of nephrolithiasis hypertension and chronic back pain who presented to the emergency department complaining of right flank pain.  Prior to admission patient reported associated symptoms of chills, bloody urine and fever up to 102 at home.  upon ED evaluation, CT with renal stone protocol showed bilateral nephrolithiasis and distal right ureteral calculus measuring 2-3 mm no significant hydronephrosis.  Urology was consulted and patient was admitted working diagnosis of nephrolithiasis. Underwent cystoscopy with stent placement. Now blood cultures 1 bottle with gram positive cocci in clusters likely contaminant.   Subjective: Patient doing well, denies abdominal pain, no cough, fever and SOB.   Assessment & Plan: Bilateral nephrolithiasis, distal right ureteral calculus.  UTI ruled out, urine cx negative  Urology consult appreciated patient scheduled for cystoscopy, stent placement and possible stone removal. Patient on rocephin, now with possible bacteremia. Continue Flomax  Urology recommendations appreciated, Continue to monitor for now   ? Gram + bacteremia vs contaminant  1 bottle growing gram + cocci in clusters. BCID was incompatible  Repeat blood cultures, continue rocephin for now, may broaden to Vanc pending ID recommendations, although this may be a contaminant, patient clinically improving.  Addendum 11:33 AM - discussed with ID Dr Megan Salon, reviewed blood cultures Staph epi, felt to be contaminant no need for treatment. Patient will be d/c home today.   Possible pneumonia on CXR - likely viral Pneumonia  Not clinically presenting as pneumonia, patient with no symptoms, no cough, SOB or chest pain. This incidental finding or likely a viral pneumonia. Pro-calcitonin negative. D/c  azithromycin.   HTN  BP remains stable  Continue current regimen   Chronic Back pain  On chronic prednisone Stable, continue for now.   DVT prophylaxis: SCD's  Code Status: Full Code Family Communication: Wife at bedside  Disposition Plan: Home when medically stable   Consultants:   Urology   Procedures:   Cystoscopy 11/8  Antimicrobials:  Rocephin 11/7 >>  Azithromycin 11/7 >> 11/8  Objective: Vitals:   11/06/17 1545 11/06/17 2101 11/07/17 0206 11/07/17 0618  BP: (!) 153/83 (!) 153/67 138/60 135/66  Pulse:  76 73 64  Resp: 16 16 16 18   Temp:  99.5 F (37.5 C) 99.5 F (37.5 C) 99.6 F (37.6 C)  TempSrc:  Oral Oral Oral  SpO2: 100% 98% 97% 96%  Weight:      Height:        Intake/Output Summary (Last 24 hours) at 11/07/2017 1003 Last data filed at 11/07/2017 0535 Gross per 24 hour  Intake 583.75 ml  Output 1525 ml  Net -941.25 ml   Filed Weights   11/05/17 1826  Weight: 107.4 kg (236 lb 12.4 oz)    Examination:  General: Pt is alert, awake, not in acute distress Cardiovascular: RRR, S1/S2 +, no rubs, no gallops Respiratory: CTA bilaterally, no wheezing, no rhonchi Abdominal: Soft, NT, ND, bowel sounds + Extremities: no edema, no cyanosis  Data Reviewed: I have personally reviewed following labs and imaging studies  CBC: Recent Labs  Lab 11/05/17 0907 11/06/17 0521 11/07/17 0525  WBC 13.5* 9.1 8.5  NEUTROABS 11.3*  --  6.5  HGB 14.9 14.1 13.8  HCT 44.5 43.1 41.4  MCV 87.3 88.7 87.7  PLT 197 168 301   Basic Metabolic Panel: Recent Labs  Lab 11/05/17 0907 11/06/17 0521 11/07/17 0525  NA 140 140 140  K 3.5 3.5 3.6  CL 107 107 108  CO2 26 25 24   GLUCOSE 117* 96 100*  BUN 23* 21* 17  CREATININE 1.41* 1.24 1.24  CALCIUM 8.6* 8.4* 8.2*  MG  --  1.9  --   PHOS  --  2.6  --    GFR: Estimated Creatinine Clearance: 68.1 mL/min (by C-G formula based on SCr of 1.24 mg/dL). Liver Function Tests: Recent Labs  Lab 11/05/17 0907  11/06/17 0521  AST 27 17  ALT 31 24  ALKPHOS 72 61  BILITOT 1.3* 2.1*  PROT 6.4* 6.1*  ALBUMIN 3.6 3.4*   No results for input(s): LIPASE, AMYLASE in the last 168 hours. No results for input(s): AMMONIA in the last 168 hours. Coagulation Profile: No results for input(s): INR, PROTIME in the last 168 hours. Cardiac Enzymes: No results for input(s): CKTOTAL, CKMB, CKMBINDEX, TROPONINI in the last 168 hours. BNP (last 3 results) No results for input(s): PROBNP in the last 8760 hours. HbA1C: No results for input(s): HGBA1C in the last 72 hours. CBG: No results for input(s): GLUCAP in the last 168 hours. Lipid Profile: No results for input(s): CHOL, HDL, LDLCALC, TRIG, CHOLHDL, LDLDIRECT in the last 72 hours. Thyroid Function Tests: Recent Labs    11/06/17 0521  TSH 1.022   Anemia Panel: No results for input(s): VITAMINB12, FOLATE, FERRITIN, TIBC, IRON, RETICCTPCT in the last 72 hours. Sepsis Labs: Recent Labs  Lab 11/06/17 1603  PROCALCITON 0.10    Recent Results (from the past 240 hour(s))  Urine culture     Status: None   Collection Time: 11/05/17  2:48 PM  Result Value Ref Range Status   Specimen Description URINE, CLEAN CATCH  Final   Special Requests NONE  Final   Culture   Final    NO GROWTH Performed at [facility], [place] [address], [place], [place] [address]    Report Status 11/06/2017 FINAL  Final  Culture, blood (routine x 2) Call MD if unable to obtain prior to antibiotics being given     Status: None (Preliminary result)   Collection Time: 11/05/17  7:18 PM  Result Value Ref Range Status   Specimen Description BLOOD LEFT ANTECUBITAL  Final   Special Requests IN PEDIATRIC BOTTLE Blood Culture adequate volume  Final   Culture  Setup Time   Final    GRAM POSITIVE COCCI IN CLUSTERS IN PEDIATRIC BOTTLE CRITICAL RESULT CALLED TO, READ BACK BY AND VERIFIED WITHGuadlupe Spanish PHARMD 2017 11/06/17 A BROWNING Performed at [facility], [place] [address], [place], [place] [address]    Culture PENDING  Incomplete   Report Status PENDING  Incomplete  Culture, blood (routine x 2) Call MD if unable to obtain prior to antibiotics being given     Status: None (Preliminary result)   Collection Time: 11/05/17  7:33 PM  Result Value Ref Range Status   Specimen Description BLOOD LEFT ANTECUBITAL  Final   Special Requests IN PEDIATRIC BOTTLE Blood Culture adequate volume  Final   Culture   Final    NO GROWTH < 24 HOURS Performed at [facility], [address], [place], [place] [address]    Report Status PENDING  Incomplete  Surgical PCR screen     Status: None   Collection Time: 11/05/17 10:48 PM  Result Value Ref Range Status   MRSA, PCR NEGATIVE NEGATIVE Final  Staphylococcus aureus NEGATIVE NEGATIVE Final    Comment: (NOTE) The Xpert SA Assay (FDA approved for NASAL specimens in patients 4 years of age and older), is one component of a comprehensive surveillance program. It is not intended to diagnose infection nor to guide or monitor treatment.       Radiology Studies: Dg Chest 2 View  Result Date: 11/05/2017 CLINICAL DATA:  Fever and right flank pain. EXAM: CHEST  2 VIEW COMPARISON:  04/10/2016. FINDINGS: The heart remains normal in size. The neural stimulator leads have been removed. Stable left mid lung zone calcified granuloma. Interval patchy opacity in right mid lung zone in the upper lobe on the lateral view with possible involvement of the superior aspect of the right middle lobe. Diffuse bronchitic changes. Thoracic spine degenerative changes including changes of DISH. IMPRESSION: Bronchitic changes, right upper lobe pneumonia and possible small amount of right middle lobe pneumonia. Electronically Signed   By: Claudie Revering M.D.   On: 11/05/2017 10:50   Dg C-arm 1-60 Min-no Report  Result Date: 11/06/2017 Fluoroscopy was utilized by the requesting physician.  No radiographic interpretation.      Scheduled Meds: .  amLODipine  10 mg Oral Daily  . atorvastatin  20 mg Oral Daily  . carvedilol  25 mg Oral BID WC  . isosorbide mononitrate  30 mg Oral Daily  . pantoprazole  40 mg Oral Daily  . predniSONE  5 mg Oral Q breakfast  . tamsulosin  0.4 mg Oral Daily   Continuous Infusions: . cefTRIAXone (ROCEPHIN)  IV 1 g (11/07/17 0937)     LOS: 2 days    Time spent: Total of 25 minutes spent with pt, greater than 50% of which was spent in discussion of  treatment, counseling and coordination of care   Chipper Oman, MD Pager: Text Page via [email or web address]   If 7PM-7AM, please contact night-coverage [email or web address] 11/07/2017, 10:03 AM

## 2017-11-07 NOTE — Progress Notes (Signed)
Went over discharge papers with patient and family.  All questions answered.  Prescriptions and AVS given to wife.  VSS.  Pt wheeled out via Therapist, sports.

## 2017-11-07 NOTE — Discharge Summary (Signed)
Physician Discharge Summary  NAZAIRE CORDIAL  ERX:[id number]  DOB: 06/20/1946  DOA: 11/05/2017 PCP: [name], MD  Admit date: 11/05/2017 Discharge date: 11/07/2017  Admitted From: Home Disposition:  Home  Recommendations for Outpatient Follow-up:  1. Follow up with PCP in 1 week  2. Please obtain BMP/CBC in one week to monitor renal function and Hgb 3. Repeat UA/Urine culture to ensure to monitor for UTI, need to be clear before stone removal  4. Follow up with urology in 1-2 week  5. Please follow up on the following pending results: final blood cultures  6. Repeat CXR in 3-4 week to assure resolution of CXR findings   Discharge Condition: Stable  CODE STATUS: Full Code  Diet recommendation: Heart Healthy   Brief/Interim Summary: TRISTAN PROTO is a 71 y/o male with medical history of nephrolithiasis hypertension and chronic back pain who presented to the emergency department complaining of right flank pain.  Prior to admission patient reported associated symptoms of chills, bloody urine and fever up to 102 at home.  upon ED evaluation, CT with renal stone protocol showed bilateral nephrolithiasis and distal right ureteral calculus measuring 2-3 mm no significant hydronephrosis.  Urology was consulted and patient was admitted working diagnosis of nephrolithiasis and possible pnemonia. Patient was started on empiric antibiotics. Underwent cystoscopy with stent placement. Blood cultures on admission showed 1 bottle with gram positive cocci in clusters likely contaminant. Discussed with ID cultures were reviewed turn to be staph epi, contaminant, recommending no treatment. Pneumonia felt to be viral after pro-calcitonin was negative. Patient clinically improved and was discharge with oral abx to complete 7 days for presumed UTI. Patient advised to follow up with PCP.  Discharge Diagnoses/Hospital Course:  Bilateral nephrolithiasis, distal right ureteral calculus.  S/p cystoscopy and  stent placement, patient was treated with rocephin  UTI ruled out, urine cx negative - initial UA grossly abnormal - in the past patient grew enterococcus, will cover with Amox. Continue Flomax. Follow up with urology as outpatient for stone removal, when urine is clear.   Positive blood culture - felt to be contaminant - Staph epi Blood cx were repeated, follow up with PCP  No need for abx treatment. Case discussed with ID.   Possible pneumonia on CXR - felt to be viral Pneumonia  Not clinically presenting as pneumonia, patient with no symptoms, no cough, SOB or chest pain. This incidental finding or likely a viral pneumonia. Pro-calcitonin negative. Receive one dose of azithromycin. Repeat CXR in 3-4 weeks    HTN  BP stable during hospital Continue home medications with no changes   Chronic Back pain  On chronic prednisone  All other chronic medical condition were stable during the hospitalization.  On the day of the discharge the patient's vitals were stable, and no other acute medical condition were reported by patient. the patient was felt safe to be discharge to home   Discharge Instructions  You were cared for by a hospitalist during your hospital stay. If you have any questions about your discharge medications or the care you received while you were in the hospital after you are discharged, you can call the unit and asked to speak with the hospitalist on call if the hospitalist that took care of you is not available. Once you are discharged, your primary care physician will handle any further medical issues. Please note that NO REFILLS for any discharge medications will be authorized once you are discharged, as it is imperative that you  return to your primary care physician (or establish a relationship with a primary care physician if you do not have one) for your aftercare needs so that they can reassess your need for medications and monitor your lab values.  Discharge  Instructions    Call MD for:  difficulty breathing, headache or visual disturbances   Complete by:  As directed    Call MD for:  extreme fatigue   Complete by:  As directed    Call MD for:  hives   Complete by:  As directed    Call MD for:  persistant dizziness or light-headedness   Complete by:  As directed    Call MD for:  persistant nausea and vomiting   Complete by:  As directed    Call MD for:  redness, tenderness, or signs of infection (pain, swelling, redness, odor or green/yellow discharge around incision site)   Complete by:  As directed    Call MD for:  severe uncontrolled pain   Complete by:  As directed    Call MD for:  temperature >100.4   Complete by:  As directed    Diet - low sodium heart healthy   Complete by:  As directed    Increase activity slowly   Complete by:  As directed      Allergies as of 11/07/2017   No Known Allergies     Medication List    STOP taking these medications   traMADol 50 MG tablet Commonly known as:  ULTRAM     TAKE these medications   amLODipine 10 MG tablet Commonly known as:  NORVASC Take 10 mg by mouth daily.   amoxicillin 500 MG tablet Commonly known as:  AMOXIL Take 1 tablet (500 mg total) 2 (two) times daily for 5 days by mouth.   aspirin 81 MG chewable tablet Chew 81 mg daily by mouth.   atorvastatin 20 MG tablet Commonly known as:  LIPITOR Take 20 mg daily by mouth.   carvedilol 25 MG tablet Commonly known as:  COREG Take 25 mg by mouth 2 (two) times daily with a meal.   HYDROcodone-acetaminophen 5-325 MG tablet Commonly known as:  NORCO/VICODIN Take 1 tablet by mouth every 6 (six) hours as needed. What changed:  reasons to take this   isosorbide mononitrate 30 MG 24 hr tablet Commonly known as:  IMDUR Take 30 mg daily by mouth.   omeprazole 20 MG capsule Commonly known as:  PRILOSEC Take 20 mg by mouth at bedtime.   predniSONE 5 MG tablet Commonly known as:  DELTASONE Take 1 tablet (5 mg total) by  mouth daily with breakfast. Resume once you have completed a taper of prednisone as instructed   Vitamin D 2000 units Caps Take 1 capsule by mouth daily.      Follow-up Information    [name], MD. Schedule an appointment as soon as possible for a visit in 1 week(s).   Specialty:  Family Medicine Why:  Hospital follow up  Contact information: [place] [address] [phone]        Raynelle Bring, MD. Schedule an appointment as soon as possible for a visit in 2 week(s).   Specialty:  Urology Why:  Hospital follow up  Contact information: [place] [address] [phone]          No Known Allergies  Consultations:  Urology   Procedures/Studies: Dg Chest 2 View  Result Date: 11/05/2017 CLINICAL DATA:  Fever and right flank pain. EXAM: CHEST  2 VIEW COMPARISON:  04/10/2016. FINDINGS: The heart remains normal in size. The neural stimulator leads have been removed. Stable left mid lung zone calcified granuloma. Interval patchy opacity in right mid lung zone in the upper lobe on the lateral view with possible involvement of the superior aspect of the right middle lobe. Diffuse bronchitic changes. Thoracic spine degenerative changes including changes of DISH. IMPRESSION: Bronchitic changes, right upper lobe pneumonia and possible small amount of right middle lobe pneumonia. Electronically Signed   By: Claudie Revering M.D.   On: 11/05/2017 10:50   Dg C-arm 1-60 Min-no Report  Result Date: 11/06/2017 Fluoroscopy was utilized by the requesting physician.  No radiographic interpretation.   Ct Renal Stone Study  Result Date: 11/05/2017 CLINICAL DATA:  Flank pain.  Evaluate recurrent stone disease. EXAM: CT ABDOMEN AND PELVIS WITHOUT CONTRAST TECHNIQUE: Multidetector CT imaging of the abdomen and pelvis was performed following the standard protocol without IV contrast. COMPARISON:  11/25/2012 FINDINGS: Lower chest: No pleural effusion.  Area of ground-glass attenuation and architectural distortion identified in the anteromedial right middle lobe. This is only partially visualized. Hepatobiliary: Small low-density foci within the liver are too small to characterize but likely represent simple cysts. The gallbladder appears normal. No biliary dilatation. Pancreas: Unremarkable. No pancreatic ductal dilatation or surrounding inflammatory changes. Spleen: Normal in size without focal abnormality. Adrenals/Urinary Tract: Right renal calculi measure up to 3 mm, image 39 of series 2. The largest left renal calculus measures 5 mm, image 40 of series 2. No hydronephrosis or hydroureter. Within the distal right ureter there is a tiny stone measuring 2-3 mm, image 82 of series 2. Urinary bladder appears normal. Stomach/Bowel: The stomach appears normal. No pathologic dilatation of the large or small bowel loops. Vascular/Lymphatic: Aortic atherosclerosis. No enlarged upper abdominal lymph nodes. No pelvic or inguinal adenopathy. Reproductive: Status post prostatectomy. Other: No abdominal wall hernia or abnormality. No abdominopelvic ascites. Musculoskeletal: Previous right hip arthroplasty. Degenerative disc disease noted within the lower thoracic and lumbar spine. IMPRESSION: 1. Bilateral nephrolithiasis. 2. Distal right ureteral calculus measures 2-3 mm. No significant hydronephrosis. 3.  Aortic Atherosclerosis (ICD10-I70.0). Electronically Signed   By: Kerby Moors M.D.   On: 11/05/2017 09:58     Discharge Exam: Vitals:   11/07/17 0206 11/07/17 0618  BP: 138/60 135/66  Pulse: 73 64  Resp: 16 18  Temp: 99.5 F (37.5 C) 99.6 F (37.6 C)  SpO2: 97% 96%   Vitals:   11/06/17 1545 11/06/17 2101 11/07/17 0206 11/07/17 0618  BP: (!) 153/83 (!) 153/67 138/60 135/66  Pulse:  76 73 64  Resp: 16 16 16 18   Temp:  99.5 F (37.5 C) 99.5 F (37.5 C) 99.6 F (37.6 C)  TempSrc:  Oral Oral Oral  SpO2: 100% 98% 97% 96%  Weight:      Height:         General: NAD Cardiovascular: RRR, S1/S2  Respiratory: CTA bilaterally Abdominal: Soft, NT, ND,+ bowel sounds  The results of significant diagnostics from this hospitalization (including imaging, microbiology, ancillary and laboratory) are listed below for reference.     Microbiology: Recent Results (from the past 240 hour(s))  Urine culture     Status: None   Collection Time: 11/05/17  2:48 PM  Result Value Ref Range Status   Specimen Description URINE, CLEAN CATCH  Final   Special Requests NONE  Final   Culture   Final    NO GROWTH Performed  at [facility], [place] [address], [place], [place] [address]    Report Status 11/06/2017 FINAL  Final  Culture, blood (routine x 2) Call MD if unable to obtain prior to antibiotics being given     Status: Abnormal   Collection Time: 11/05/17  7:18 PM  Result Value Ref Range Status   Specimen Description BLOOD LEFT ANTECUBITAL  Final   Special Requests IN PEDIATRIC BOTTLE Blood Culture adequate volume  Final   Culture  Setup Time   Final    GRAM POSITIVE COCCI IN CLUSTERS IN PEDIATRIC BOTTLE CRITICAL RESULT CALLED TO, READ BACK BY AND VERIFIED WITH: N GLOGOVAC PHARMD 2017 11/06/17 A BROWNING    Culture (A)  Final    STAPHYLOCOCCUS SPECIES (COAGULASE NEGATIVE) THE SIGNIFICANCE OF ISOLATING THIS ORGANISM FROM A SINGLE SET OF BLOOD CULTURES WHEN MULTIPLE SETS ARE DRAWN IS UNCERTAIN. PLEASE NOTIFY THE MICROBIOLOGY DEPARTMENT WITHIN ONE WEEK IF SPECIATION AND SENSITIVITIES ARE REQUIRED. Performed at [facility], [place] [address], [place], [place] [address]    Report Status 11/07/2017 FINAL  Final  Culture, blood (routine x 2) Call MD if unable to obtain prior to antibiotics being given     Status: None (Preliminary result)   Collection Time: 11/05/17  7:33 PM  Result Value Ref Range Status   Specimen Description BLOOD LEFT ANTECUBITAL  Final   Special Requests IN PEDIATRIC BOTTLE Blood Culture adequate volume  Final   Culture    Final    NO GROWTH < 24 HOURS Performed at [facility], [place] [address], [place], [place] [address]    Report Status PENDING  Incomplete  Surgical PCR screen     Status: None   Collection Time: 11/05/17 10:48 PM  Result Value Ref Range Status   MRSA, PCR NEGATIVE NEGATIVE Final   Staphylococcus aureus NEGATIVE NEGATIVE Final    Comment: (NOTE) The Xpert SA Assay (FDA approved for NASAL specimens in patients 38 years of age and older), is one component of a comprehensive surveillance program. It is not intended to diagnose infection nor to guide or monitor treatment.      Labs: BNP (last 3 results) No results for input(s): BNP in the last 8760 hours. Basic Metabolic Panel: Recent Labs  Lab 11/05/17 0907 11/06/17 0521 11/07/17 0525  NA 140 140 140  K 3.5 3.5 3.6  CL 107 107 108  CO2 26 25 24   GLUCOSE 117* 96 100*  BUN 23* 21* 17  CREATININE 1.41* 1.24 1.24  CALCIUM 8.6* 8.4* 8.2*  MG  --  1.9  --   PHOS  --  2.6  --    Liver Function Tests: Recent Labs  Lab 11/05/17 0907 11/06/17 0521  AST 27 17  ALT 31 24  ALKPHOS 72 61  BILITOT 1.3* 2.1*  PROT 6.4* 6.1*  ALBUMIN 3.6 3.4*   No results for input(s): LIPASE, AMYLASE in the last 168 hours. No results for input(s): AMMONIA in the last 168 hours. CBC: Recent Labs  Lab 11/05/17 0907 11/06/17 0521 11/07/17 0525  WBC 13.5* 9.1 8.5  NEUTROABS 11.3*  --  6.5  HGB 14.9 14.1 13.8  HCT 44.5 43.1 41.4  MCV 87.3 88.7 87.7  PLT 197 168 169   Cardiac Enzymes: No results for input(s): CKTOTAL, CKMB, CKMBINDEX, TROPONINI in the last 168 hours. BNP: Invalid input(s): POCBNP CBG: No results for input(s): GLUCAP in the last 168 hours. D-Dimer No results for input(s): DDIMER in the last 72  hours. Hgb A1c No results for input(s): HGBA1C in the last 72 hours. Lipid Profile No results for input(s): CHOL, HDL, LDLCALC, TRIG, CHOLHDL, LDLDIRECT in the last 72 hours. Thyroid function studies Recent Labs     11/06/17 0521  TSH 1.022   Anemia work up No results for input(s): VITAMINB12, FOLATE, FERRITIN, TIBC, IRON, RETICCTPCT in the last 72 hours. Urinalysis    Component Value Date/Time   COLORURINE YELLOW 11/05/2017 1010   APPEARANCEUR CLEAR 11/05/2017 1010   LABSPEC 1.025 11/05/2017 1010   PHURINE 5.5 11/05/2017 1010   GLUCOSEU NEGATIVE 11/05/2017 1010   HGBUR LARGE (A) 11/05/2017 1010   HGBUR large 02/26/2010 1214   BILIRUBINUR NEGATIVE 11/05/2017 1010   BILIRUBINUR 1+ 06/26/2016 1117   KETONESUR NEGATIVE 11/05/2017 1010   PROTEINUR NEGATIVE 11/05/2017 1010   UROBILINOGEN 1.0 06/26/2016 1117   UROBILINOGEN 1.0 03/05/2010 2252   NITRITE NEGATIVE 11/05/2017 1010   LEUKOCYTESUR NEGATIVE 11/05/2017 1010   Sepsis Labs Invalid input(s): PROCALCITONIN,  WBC,  LACTICIDVEN Microbiology Recent Results (from the past 240 hour(s))  Urine culture     Status: None   Collection Time: 11/05/17  2:48 PM  Result Value Ref Range Status   Specimen Description URINE, CLEAN CATCH  Final   Special Requests NONE  Final   Culture   Final    NO GROWTH Performed at [facility], [place] [address], [place], [place] [address]    Report Status 11/06/2017 FINAL  Final  Culture, blood (routine x 2) Call MD if unable to obtain prior to antibiotics being given     Status: Abnormal   Collection Time: 11/05/17  7:18 PM  Result Value Ref Range Status   Specimen Description BLOOD LEFT ANTECUBITAL  Final   Special Requests IN PEDIATRIC BOTTLE Blood Culture adequate volume  Final   Culture  Setup Time   Final    GRAM POSITIVE COCCI IN CLUSTERS IN PEDIATRIC BOTTLE CRITICAL RESULT CALLED TO, READ BACK BY AND VERIFIED WITH: N GLOGOVAC PHARMD 2017 11/06/17 A BROWNING    Culture (A)  Final    STAPHYLOCOCCUS SPECIES (COAGULASE NEGATIVE) THE SIGNIFICANCE OF ISOLATING THIS ORGANISM FROM A SINGLE SET OF BLOOD CULTURES WHEN MULTIPLE SETS ARE DRAWN IS UNCERTAIN. PLEASE NOTIFY THE MICROBIOLOGY DEPARTMENT WITHIN ONE  WEEK IF SPECIATION AND SENSITIVITIES ARE REQUIRED. Performed at [facility], [place] [address] [place], [place] [address]    Report Status 11/07/2017 FINAL  Final  Culture, blood (routine x 2) Call MD if unable to obtain prior to antibiotics being given     Status: None (Preliminary result)   Collection Time: 11/05/17  7:33 PM  Result Value Ref Range Status   Specimen Description BLOOD LEFT ANTECUBITAL  Final   Special Requests IN PEDIATRIC BOTTLE Blood Culture adequate volume  Final   Culture   Final    NO GROWTH < 24 HOURS Performed at [facility], [place] [address], [place], [place] [address]    Report Status PENDING  Incomplete  Surgical PCR screen     Status: None   Collection Time: 11/05/17 10:48 PM  Result Value Ref Range Status   MRSA, PCR NEGATIVE NEGATIVE Final   Staphylococcus aureus NEGATIVE NEGATIVE Final    Comment: (NOTE) The Xpert SA Assay (FDA approved for NASAL specimens in patients 8 years of age and older), is one component of a comprehensive surveillance program. It is not intended to diagnose infection nor to guide or monitor treatment.  Time coordinating discharge: 30 minutes  SIGNED:  Chipper Oman, MD  [facility] 11/07/2017, 11:38 AM  Pager please text page via  [email or web address] Password [email or web address]

## 2017-11-10 ENCOUNTER — Other Ambulatory Visit: Payer: Self-pay | Admitting: Urology

## 2017-11-10 ENCOUNTER — Telehealth: Payer: Self-pay

## 2017-11-10 LAB — CULTURE, BLOOD (ROUTINE X 2)
Culture: NO GROWTH
Special Requests: ADEQUATE

## 2017-11-10 NOTE — Telephone Encounter (Signed)
Per Chart Review: Bruce Ball  QHU:[id number]  DOB: [birth year]  DOA: 11/05/2017 PCP: [name], MD  Admit date: 11/05/2017 Discharge date: 11/07/2017  Admitted From:  Disposition:    Recommendations for Outpatient Follow-up:  1. Follow up with PCP in 1-2 weeks 2. Please obtain BMP/CBC in one week 3. Please follow up on the following pending results:  Home Health: Equipment/Devices:  Discharge Condition: CODE STATUS: Diet recommendation: Heart Healthy / Carb Modified / Regular / Dysphagia  _________________________________________________________________________________ Per Telephone Call: Transition Care Management Follow-up Telephone Call   Date discharged? 11/07/17   How have you been since you were released from the hospital? "ok"   Do you understand why you were in the hospital? yes   Do you understand the discharge instructions? yes   Where were you discharged to? Home   Items Reviewed:  Medications reviewed: yes  Allergies reviewed: yes  Dietary changes reviewed: yes  Referrals reviewed: yes. Pt has an appt with urologist Dr Alinda Money.   Functional Questionnaire:   Activities of Daily Living (ADLs):   He states they are independent in the following: ambulation, bathing and hygiene, feeding, continence, grooming, toileting and dressing States they require assistance with the following: NA   Any transportation issues/concerns?: no   Any patient concerns? no   Confirmed importance and date/time of follow-up visits scheduled yes  Provider Appointment booked with Dr Yong Channel 11/11/17 at 4:00  Confirmed with patient if condition begins to worsen call PCP or go to the ER.  Patient was given the office number and encouraged to call back with question or concerns.  : yes

## 2017-11-10 NOTE — Telephone Encounter (Signed)
noted thanks  

## 2017-11-11 ENCOUNTER — Ambulatory Visit: Payer: Medicare Other

## 2017-11-11 ENCOUNTER — Ambulatory Visit (INDEPENDENT_AMBULATORY_CARE_PROVIDER_SITE_OTHER): Payer: Medicare Other | Admitting: Family Medicine

## 2017-11-11 ENCOUNTER — Encounter: Payer: Self-pay | Admitting: Family Medicine

## 2017-11-11 VITALS — BP 128/76 | HR 68 | Temp 98.1°F | Ht 71.0 in | Wt 239.0 lb

## 2017-11-11 DIAGNOSIS — E785 Hyperlipidemia, unspecified: Secondary | ICD-10-CM | POA: Insufficient documentation

## 2017-11-11 DIAGNOSIS — J189 Pneumonia, unspecified organism: Secondary | ICD-10-CM

## 2017-11-11 DIAGNOSIS — N201 Calculus of ureter: Secondary | ICD-10-CM | POA: Diagnosis not present

## 2017-11-11 DIAGNOSIS — R319 Hematuria, unspecified: Secondary | ICD-10-CM

## 2017-11-11 DIAGNOSIS — M545 Low back pain: Secondary | ICD-10-CM | POA: Diagnosis not present

## 2017-11-11 DIAGNOSIS — I10 Essential (primary) hypertension: Secondary | ICD-10-CM

## 2017-11-11 DIAGNOSIS — J181 Lobar pneumonia, unspecified organism: Secondary | ICD-10-CM

## 2017-11-11 DIAGNOSIS — M450 Ankylosing spondylitis of multiple sites in spine: Secondary | ICD-10-CM

## 2017-11-11 DIAGNOSIS — G8929 Other chronic pain: Secondary | ICD-10-CM

## 2017-11-11 NOTE — Assessment & Plan Note (Signed)
Off tramadol for now while on hydrocodone. Can use tramadol if needed as seems to cause him to have less fatigue

## 2017-11-11 NOTE — Assessment & Plan Note (Signed)
Hematuria Patient has hydrocodone at home and using prn. Discussed measures to avoid constipation.  He has planned outpatient procedure on December 6th Stent likely source of hematuria- will get urine culture per hospitalist request to make sure clear urine given upcoming intervention. I doubt UTI- in addition even urine culture in hospital was negative.

## 2017-11-11 NOTE — Assessment & Plan Note (Signed)
Remains on prednisone through the [place]

## 2017-11-11 NOTE — Assessment & Plan Note (Signed)
With pro calcitonin not elevated was not thought bacterial.  Should have some coverage from amoxicillin in addition to dose of azithromycin Rare cough today, no shortness of breath, afebrile Report CXR in 1 month to evaluate clearance  I personally reviewed x-ray from hospital and do see evidence of opacity in RUL into right middle lobe of lung

## 2017-11-11 NOTE — Assessment & Plan Note (Signed)
Continue atorvastatin 20 mg

## 2017-11-11 NOTE — Progress Notes (Signed)
Subjective:  Bruce Ball is a 71 y.o. year old very pleasant male patient who presents for transitional care management and hospital follow up for bilateral nephrolithiasis, positive blood culture, possible pneumonia. Patient was hospitalized from November 05, 2017 to November 07, 2017. A TCM phone call was completed on November 10, 2017. Medical complexity moderate  Patient presented to the emergency room complaining of flank pain as well as chills, bloody urine, fever to 102 and CT renal stone protocol showed bilateral nephrolithiasis and distal right ureteral calculus 2-3 mm with no significant hydronephrosis.  Urology was consulted.  It was thought that fever and chills were more likely related to pneumonia and patient was started on empiric antibiotics.  Pro-calcitonin came back not elevated and pneumonia was thought likely to be viral.  It was suggested that patient have 3-4-week follow-up x-ray.  Patient denied symptoms at the time of pneumonia such as cough, shortness of breath, chest pain.  Did also receive 1 dose of azithromycin  Blood cultures on admission showed 1 bottle with gram-positive cocci in clusters-this ended up growing out Staphylococcus epidermis which is likely contaminant.  Infectious disease was consulted who recommended no treatment. In regards to positive blood culture, cultures were repeated before discharge on 11/9/18and reviewed today-so far 4 days in and cultures are negative-we will continue to monitor.  Patient underwent cystoscopy with stent placement on right.   In the end, since patient had a history of enterococcus he was treated with 7 days of amoxicillin.  Urine culture was negative but patient treated regardless due to febrile illness and  Stone.   It was thought his illness could have been due to urinary tract infection per notes. He was continued on Flomax. Patient to follow-up outpatient for stone removal on December 6th.  Appears he is scheduled with Dr.  Alinda Money.  For patient's hypertension-he was continued on his chronic medications including amlodipine 10 mg, Coreg 25 mg twice daily, Imdur 30 mg  For patient's hyperlipidemia, he was continued on atorvastatin 20 mg.  Chronic back pain. Has ankylosing spondylitis-patient was continued on chronic prednisone.  Had been on tramadol prn- now using hydrocodone prn.   Patient remains tired, low energy, having some pains on right side with stent in. Skipped hydrocodone last night but took 2 nights ago. Feels run down for 2-3 days after each hydrocodone. Doing miralax in morning to help with constipation.    See problem oriented charting as well ROS- has fatigue, RLQ pain when urinating. Still with back pain. No chest pain or shortness of breath reported. Sparing cough.    Past Medical History-  Patient Active Problem List   Diagnosis Date Noted  . High risk medication use 02/28/2017    Priority: High  . Ankylosing spondylitis ([place]) 05/24/2014    Priority: High  . Right ureteral stone 11/05/2017    Priority: Medium  . History of prostate cancer 03/08/2017    Priority: Medium  . LOW BACK PAIN 04/21/2008    Priority: Medium  . Essential hypertension 10/29/2007    Priority: Medium  . Community acquired pneumonia of right upper lobe of lung ([place]) 11/05/2017    Priority: Low  . Hyperlipidemia 11/11/2017  . History of total right hip replacement 03/08/2017  . 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 Spondylosis of lumbar region without myelopathy or radiculopathy 02/28/2017  . DDD (degenerative disc disease), thoracic 02/28/2017  . Ataxia 05/18/2016  . Vestibular disequilibrium 05/18/2016  . Lung nodule, solitary 05/24/2014  . Peripheral edema  06/08/2012  . EXTRINSIC ASTHMA, UNSPECIFIED 04/07/2009  . History of peptic ulcer disease 04/21/2008    Medications- reviewed and updated  A medical reconciliation was performed comparing current medicines to hospital discharge medications. Current  Outpatient Medications  Medication Sig Dispense Refill  . amLODipine (NORVASC) 10 MG tablet Take 10 mg by mouth daily.      Marland Kitchen amoxicillin (AMOXIL) 500 MG tablet Take 1 tablet (500 mg total) 2 (two) times daily for 5 days by mouth. 10 tablet 0  . aspirin 81 MG chewable tablet Chew 81 mg daily by mouth.    Marland Kitchen atorvastatin (LIPITOR) 20 MG tablet Take 20 mg daily by mouth.    . carvedilol (COREG) 25 MG tablet Take 25 mg by mouth 2 (two) times daily with a meal.      . Cholecalciferol (VITAMIN D) 2000 units CAPS Take 1 capsule by mouth daily.    Marland Kitchen HYDROcodone-acetaminophen (NORCO/VICODIN) 5-325 MG tablet Take 1 tablet by mouth every 6 (six) hours as needed. (Patient taking differently: Take 1 tablet every 6 (six) hours as needed by mouth for moderate pain or severe pain. ) 5 tablet 0  . isosorbide mononitrate (IMDUR) 30 MG 24 hr tablet Take 30 mg daily by mouth.    . levofloxacin (LEVAQUIN) 500 MG tablet Take 1 tablet (500 mg total) daily by mouth. 7 tablet 0  . omeprazole (PRILOSEC) 20 MG capsule Take 20 mg by mouth at bedtime.     . predniSONE (DELTASONE) 5 MG tablet Take 1 tablet (5 mg total) by mouth daily with breakfast. Resume once you have completed a taper of prednisone as instructed     No current facility-administered medications for this visit.     Objective: BP 128/76 (BP Location: Left Arm, Patient Position: Sitting, Cuff Size: Large)   Pulse 68   Temp 98.1 F (36.7 C) (Oral)   Ht 5\' 11"  (1.803 m)   Wt 239 lb (108.4 kg)   SpO2 97%   BMI 33.33 kg/m  Gen: NAD, resting comfortably CV: RRR no murmurs rubs or gallops Lungs: CTAB no crackles, wheeze, rhonchi Abdomen: soft/nontender/nondistended/normal bowel sounds. obese Ext: no edema Skin: warm, dry Antalgic gait  Assessment/Plan:   Essential hypertension Controlled today on amlodipine 10 mg, Coreg 25 mg twice daily, Imdur 30 mg  Right ureteral stone Hematuria Patient has hydrocodone at home and using prn. Discussed  measures to avoid constipation.  He has planned outpatient procedure on December 6th Stent likely source of hematuria- will get urine culture per hospitalist request to make sure clear urine given upcoming intervention. I doubt UTI- in addition even urine culture in hospital was negative.   Community acquired pneumonia of right upper lobe of lung ([place]) With pro calcitonin not elevated was not thought bacterial.  Should have some coverage from amoxicillin in addition to dose of azithromycin Rare cough today, no shortness of breath, afebrile Report CXR in 1 month to evaluate clearance  I personally reviewed x-ray from hospital and do see evidence of opacity in RUL into right middle lobe of lung  LOW BACK PAIN Off tramadol for now while on hydrocodone. Can use tramadol if needed as seems to cause him to have less fatigue  Hyperlipidemia Continue atorvastatin 20mg   Ankylosing spondylitis (HCC) Remains on prednisone through the [place]  Orders Placed This Encounter  Procedures  . Urine Culture    [facility]    Standing Status:   Future    Number of Occurrences:   1  Standing Expiration Date:   11/11/2018  . DG Chest 2 View    Standing Status:   Future    Number of Occurrences:   1    Standing Expiration Date:   01/11/2019    Order Specific Question:   Reason for Exam (SYMPTOM  OR DIAGNOSIS REQUIRED)    Answer:   follow up pneumonia    Order Specific Question:   Preferred imaging location?    Answer:   [place]  . CBC with Differential/Platelet  . Basic metabolic panel    [place]   Return precautions advised.  [name], MD

## 2017-11-11 NOTE — Assessment & Plan Note (Signed)
Controlled today on amlodipine 10 mg, Coreg 25 mg twice daily, Imdur 30 mg

## 2017-11-11 NOTE — Patient Instructions (Addendum)
Drop off urine when you can. Will check with lab about taking cup.   Schedule x-ray visit in 1 month.   Please stop by lab before you go for bloodwork  Let us know if you have new or worsening symptoms. Hopefully they can move you up for urology visit. Hope you can pass this stone!

## 2017-11-12 LAB — CBC WITH DIFFERENTIAL/PLATELET
Basophils Absolute: 0 10*3/uL (ref 0.0–0.1)
Basophils Relative: 0.5 % (ref 0.0–3.0)
Eosinophils Absolute: 0 10*3/uL (ref 0.0–0.7)
Eosinophils Relative: 0.5 % (ref 0.0–5.0)
HCT: 47.7 % (ref 39.0–52.0)
Hemoglobin: 15.6 g/dL (ref 13.0–17.0)
Lymphocytes Relative: 16.1 % (ref 12.0–46.0)
Lymphs Abs: 1.4 10*3/uL (ref 0.7–4.0)
MCHC: 32.8 g/dL (ref 30.0–36.0)
MCV: 88.8 fl (ref 78.0–100.0)
Monocytes Absolute: 0.6 10*3/uL (ref 0.1–1.0)
Monocytes Relative: 6.4 % (ref 3.0–12.0)
Neutro Abs: 6.6 10*3/uL (ref 1.4–7.7)
Neutrophils Relative %: 76.5 % (ref 43.0–77.0)
Platelets: 236 10*3/uL (ref 150.0–400.0)
RBC: 5.37 Mil/uL (ref 4.22–5.81)
RDW: 13.7 % (ref 11.5–15.5)
WBC: 8.7 10*3/uL (ref 4.0–10.5)

## 2017-11-12 LAB — CULTURE, BLOOD (ROUTINE X 2)
Culture: NO GROWTH
Culture: NO GROWTH
Special Requests: ADEQUATE
Special Requests: ADEQUATE

## 2017-11-12 LAB — URINE CULTURE
MICRO NUMBER:: 81278090
Result:: NO GROWTH
SPECIMEN QUALITY:: ADEQUATE

## 2017-11-12 LAB — BASIC METABOLIC PANEL
BUN: 20 mg/dL (ref 6–23)
CO2: 29 mEq/L (ref 19–32)
Calcium: 9.2 mg/dL (ref 8.4–10.5)
Chloride: 106 mEq/L (ref 96–112)
Creatinine, Ser: 1.5 mg/dL (ref 0.40–1.50)
GFR: 48.98 mL/min — ABNORMAL LOW (ref >60.00)
Glucose, Bld: 138 mg/dL — ABNORMAL HIGH (ref 70–99)
Potassium: 4.1 mEq/L (ref 3.5–5.1)
Sodium: 143 mEq/L (ref 135–145)

## 2017-11-17 ENCOUNTER — Other Ambulatory Visit (HOSPITAL_COMMUNITY): Payer: Self-pay | Admitting: Urology

## 2017-11-17 ENCOUNTER — Ambulatory Visit (HOSPITAL_COMMUNITY)
Admission: RE | Admit: 2017-11-17 | Discharge: 2017-11-17 | Disposition: A | Discharge status: Home / Self Care | Payer: Medicare Other | Source: Ambulatory Visit | Attending: Urology | Admitting: Urology

## 2017-11-17 DIAGNOSIS — N201 Calculus of ureter: Secondary | ICD-10-CM

## 2017-11-17 DIAGNOSIS — J189 Pneumonia, unspecified organism: Secondary | ICD-10-CM | POA: Diagnosis not present

## 2017-11-18 NOTE — H&P (Signed)
Office Visit Report     11/17/2017   --------------------------------------------------------------------------------   Bruce Ball  MRN: [id number]  PRIMARY CARE:  Brayton Mars. Melanee Spry, MD  DOB: [birth year], 71 year old Male  REFERRING:    SSN: [id number]  PROVIDER:  Raynelle Bring, M.D.    LOCATION:  [facility] [phone]   --------------------------------------------------------------------------------   CC/HPI: Right distal ureteral calculi   [name] was recently seen in the hospital after he presented with 2 distal right ureteral calculi and fever. His urinalysis was not particular concerning for infection but his clinical symptoms were. Furthermore, he incidentally was noted to have a right-sided pneumonia on his chest x-ray. He was admitted on antibiotic therapy and underwent right ureteral stent placement. He follows up today. He denies any recent fever. He denies any cough or other pulmonary symptoms such as shortness of breath. He does complain of significant ongoing right lower quadrant pain and discomfort likely related to his ureteral stent. He continues to have hematuria.     ALLERGIES: No Allergies    MEDICATIONS: Amlodipine Besylate 10 mg tablet  Aspirin Ec 81 mg tablet, delayed release  Atorvastatin Calcium 20 mg tablet  Carvedilol 25 mg tablet  Clonidine Hcl 0.2 mg tablet 0 Oral  Cyclobenzaprine Hcl 10 mg tablet Oral  Diltiazem Hcl 90 mg tablet Oral  Hydrocodone-Acetaminophen 5 mg-500 mg capsule Oral  Isosorbide Mononitrate Er 30 mg tablet, extended release 24 hr  Metoprolol Tartrate 25 mg tablet 0 Oral Twice daily  Omeprazole 20 mg capsule,delayed release Oral     GU PSH: Robotic Radical Prostatectomy - 2008      PSH Notes: Femur Repair, Prostatect Retropubic Radical W/ Nerve Sparing Laparoscopic, hip and thigh prosthesis- hx bone cancer   NON-GU PSH: None   GU PMH: History of prostate cancer, Prostate Cancer - 2014      PMH  Notes: bone cancer   1) Prostate cancer: He is s/p an open RRP by Dr. Luanne Bras in 2003. His pathology indicated pT2c N0 Mx, Gleason 3+3=6 adenocarcinoma. His PSA has been undetectable since treatment.   2) Urolithiasis: He has a history of urolithiasis. He presented to me in November 2018 as a hospital consult when he had fever and an obstructing right ureteral stone.   Nov 2018: Right ureteral stent placement     NON-GU PMH: GERD Hypercholesterolemia Hypertension    FAMILY HISTORY: 1 Daughter - Daughter 1 son - Son Death In The Family Father - Brother Family Health Status Number - Brother nephrolithiasis - Brother Prostate Cancer - Brother Tuberculosis - Uncle   SOCIAL HISTORY: Marital Status: Married Preferred Language: English; Ethnicity: Not Hispanic Or Latino; Race: White Current Smoking Status: Patient does not smoke anymore. Has not smoked since 10/30/1980.   Tobacco Use Assessment Completed: Used Tobacco in last 30 days? Has never drank.  Drinks 1 caffeinated drink per day. 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     Notes: Tobacco Use, Alcohol Use, Marital History - Currently Married, Occupation:, Caffeine Use   REVIEW OF SYSTEMS:    GU Review Male:   Patient reports frequent urination, hard to postpone urination, burning/ pain with urination, get up at night to urinate, leakage of urine, stream starts and stops, trouble starting your streams, and have to strain to urinate .   Gastrointestinal (Upper):   Patient reports nausea and vomiting.   Gastrointestinal (Lower):   Patient reports constipation. Patient denies diarrhea.  Constitutional:   Patient denies fever, night  sweats, weight loss, and fatigue.  Skin:   Patient denies skin rash/ lesion and itching.  Eyes:   Patient reports blurred vision. Patient denies double vision.  Ears/ Nose/ Throat:   Patient denies sore throat and sinus problems.  Hematologic/Lymphatic:   Patient reports easy bruising.  Patient denies swollen glands.  Cardiovascular:   Patient denies leg swelling and chest pains.  Respiratory:   Patient denies cough and shortness of breath.  Endocrine:   Patient denies excessive thirst.  Musculoskeletal:   Patient reports back pain and joint pain.   Neurological:   Patient reports dizziness. Patient denies headaches.  Psychologic:   Patient denies depression and anxiety.   VITAL SIGNS:      11/17/2017 12:57 PM  Weight 239 lb / 108.41 kg  Height 71 in / 180.34 cm  BP 130/67 mmHg  Pulse 58 /min  Temperature 98.2 F / 36.7 C  BMI 33.3 kg/m   MULTI-SYSTEM PHYSICAL EXAMINATION:    Constitutional: Well-nourished. No physical deformities. Normally developed. Good grooming.  Respiratory: No labored breathing, no use of accessory muscles. Clear bilaterally.  Cardiovascular: Normal temperature, normal extremity pulses, no swelling, no varicosities. Regular rate and rhythm.  Gastrointestinal: No CVA tenderness.     PAST DATA REVIEWED:  Source Of History:  Patient  Urine Test Review:   Urinalysis  X-Ray Review: Chest X-Ray Outside.: Reviewed Films.  C.T. Abdomen/Pelvis: Reviewed Films.     07/22/07 07/11/06 09/25/05 09/26/04 09/28/03  PSA  Total PSA 0.01  0.00  0.00  0.00  0.00     09/25/05 09/26/04  Hormones  Testosterone, Total 3.08  2.78     PROCEDURES:          Urinalysis w/Scope Dipstick Dipstick Cont'd Micro  Color: Red Bilirubin: Neg WBC/hpf: NS (Not Seen)  Appearance: Cloudy Ketones: Neg RBC/hpf: >60/hpf  Specific Gravity: 1.020 Blood: 3+ Bacteria: NS (Not Seen)  pH: 7.5 Protein: 2+ Cystals: NS (Not Seen)  Glucose: Neg Urobilinogen: 0.2 Casts: NS (Not Seen)    Nitrites: Neg Trichomonas: Not Present    Leukocyte Esterase: Trace Mucous: Not Present      Epithelial Cells: NS (Not Seen)      Yeast: NS (Not Seen)      Sperm: Not Present    ASSESSMENT:      ICD-10 Details  1 GU:   Ureteral calculus - N20.1    PLAN:            Medications New  Meds: Tamsulosin Hcl 0.4 mg capsule, ext release 24 hr 1 capsule PO Q HS   #14  0 Refill(s)            Orders X-Rays: Chest X-Ray Outside.  X-Ray Notes: ...          Schedule Return Visit/Planned Activity: Keep Scheduled Appointment          Document Letter(s):  Created for Patient: Clinical Summary         Notes:   1. Right ureteral calculi: He has completed his antibiotic course with Levaquin and his urine does not appear infected. He will have a repeat chest x-ray today prior to proceeding with ureteroscopic treatment of his ureteral stones. He has been scheduled for cystoscopy, right ureteroscopy and stone removal with possible laser lithotripsy. Due to the fact that he is not tolerating his stent very well, I will see if we can move up his procedure from early December to possibly next week.   Cc: Dr. [name]    *  Signed by Raynelle Bring, M.D. on 11/17/17 at 5:30 PM (EST)*

## 2017-11-19 ENCOUNTER — Other Ambulatory Visit: Payer: Self-pay

## 2017-11-19 ENCOUNTER — Encounter (HOSPITAL_COMMUNITY): Payer: Self-pay | Admitting: *Deleted

## 2017-11-24 ENCOUNTER — Other Ambulatory Visit: Payer: Self-pay

## 2017-11-24 ENCOUNTER — Ambulatory Visit (HOSPITAL_COMMUNITY): Payer: Medicare Other | Admitting: Anesthesiology

## 2017-11-24 ENCOUNTER — Ambulatory Visit (HOSPITAL_COMMUNITY)
Admission: RE | Admit: 2017-11-24 | Discharge: 2017-11-24 | Disposition: A | Discharge status: Home / Self Care | Payer: Medicare Other | Source: Ambulatory Visit | Attending: Urology | Admitting: Urology

## 2017-11-24 ENCOUNTER — Ambulatory Visit (HOSPITAL_COMMUNITY): Payer: Medicare Other

## 2017-11-24 ENCOUNTER — Encounter (HOSPITAL_COMMUNITY)
Admission: RE | Disposition: A | Discharge status: Home / Self Care | Payer: Self-pay | Source: Ambulatory Visit | Attending: Urology

## 2017-11-24 ENCOUNTER — Encounter (HOSPITAL_COMMUNITY): Payer: Self-pay | Admitting: Emergency Medicine

## 2017-11-24 DIAGNOSIS — Z466 Encounter for fitting and adjustment of urinary device: Secondary | ICD-10-CM | POA: Diagnosis not present

## 2017-11-24 DIAGNOSIS — E785 Hyperlipidemia, unspecified: Secondary | ICD-10-CM | POA: Diagnosis not present

## 2017-11-24 DIAGNOSIS — K219 Gastro-esophageal reflux disease without esophagitis: Secondary | ICD-10-CM | POA: Insufficient documentation

## 2017-11-24 DIAGNOSIS — E7800 Pure hypercholesterolemia, unspecified: Secondary | ICD-10-CM | POA: Diagnosis not present

## 2017-11-24 DIAGNOSIS — Z79899 Other long term (current) drug therapy: Secondary | ICD-10-CM | POA: Insufficient documentation

## 2017-11-24 DIAGNOSIS — N201 Calculus of ureter: Secondary | ICD-10-CM | POA: Diagnosis not present

## 2017-11-24 DIAGNOSIS — I10 Essential (primary) hypertension: Secondary | ICD-10-CM | POA: Diagnosis not present

## 2017-11-24 DIAGNOSIS — Z7982 Long term (current) use of aspirin: Secondary | ICD-10-CM | POA: Diagnosis not present

## 2017-11-24 DIAGNOSIS — Z87891 Personal history of nicotine dependence: Secondary | ICD-10-CM | POA: Diagnosis not present

## 2017-11-24 DIAGNOSIS — Z711 Person with feared health complaint in whom no diagnosis is made: Secondary | ICD-10-CM | POA: Diagnosis not present

## 2017-11-24 DIAGNOSIS — Z8546 Personal history of malignant neoplasm of prostate: Secondary | ICD-10-CM | POA: Insufficient documentation

## 2017-11-24 DIAGNOSIS — R911 Solitary pulmonary nodule: Secondary | ICD-10-CM | POA: Diagnosis not present

## 2017-11-24 DIAGNOSIS — Z87442 Personal history of urinary calculi: Secondary | ICD-10-CM | POA: Diagnosis not present

## 2017-11-24 HISTORY — PX: CYSTOSCOPY/URETEROSCOPY/HOLMIUM LASER: SHX6545

## 2017-11-24 HISTORY — DX: Personal history of urinary calculi: Z87.442

## 2017-11-24 LAB — BASIC METABOLIC PANEL
Anion gap: 7 (ref 5–15)
BUN: 18 mg/dL (ref 6–20)
CO2: 25 mmol/L (ref 22–32)
Calcium: 8.6 mg/dL — ABNORMAL LOW (ref 8.9–10.3)
Chloride: 108 mmol/L (ref 101–111)
Creatinine, Ser: 1.32 mg/dL — ABNORMAL HIGH (ref 0.61–1.24)
GFR calc Af Amer: >60 mL/min (ref >60)
GFR calc non Af Amer: 53 mL/min — ABNORMAL LOW (ref >60)
Glucose, Bld: 99 mg/dL (ref 65–99)
Potassium: 3.8 mmol/L (ref 3.5–5.1)
Sodium: 140 mmol/L (ref 135–145)

## 2017-11-24 SURGERY — CYSTOURETEROSCOPY, USING HOLMIUM LASER
Anesthesia: General | Laterality: Right

## 2017-11-24 MED ORDER — OXYCODONE HCL 5 MG PO TABS: 5.0000 mg | ORAL_TABLET | Freq: Once | ORAL | Status: DC | PRN

## 2017-11-24 MED ORDER — 0.9 % SODIUM CHLORIDE (POUR BTL) OPTIME
TOPICAL | Status: DC | PRN
  Administered 2017-11-24: 1000 mL

## 2017-11-24 MED ORDER — ONDANSETRON HCL 4 MG/2ML IJ SOLN
INTRAMUSCULAR | Status: DC | PRN
  Administered 2017-11-24: 4 mg via INTRAVENOUS

## 2017-11-24 MED ORDER — IOHEXOL 300 MG/ML  SOLN
INTRAMUSCULAR | Status: DC | PRN
  Administered 2017-11-24: 5 mL

## 2017-11-24 MED ORDER — LIDOCAINE 2% (20 MG/ML) 5 ML SYRINGE
INTRAMUSCULAR | Status: DC | PRN
  Administered 2017-11-24: 60 mg via INTRAVENOUS

## 2017-11-24 MED ORDER — FENTANYL CITRATE (PF) 100 MCG/2ML IJ SOLN
INTRAMUSCULAR | Status: AC
  Filled 2017-11-24: qty 2

## 2017-11-24 MED ORDER — LACTATED RINGERS IV SOLN
INTRAVENOUS | Status: DC
  Administered 2017-11-24 (×2): via INTRAVENOUS

## 2017-11-24 MED ORDER — SODIUM CHLORIDE 0.9 % IR SOLN
Status: DC | PRN
  Administered 2017-11-24: 1000 mL

## 2017-11-24 MED ORDER — FENTANYL CITRATE (PF) 100 MCG/2ML IJ SOLN
INTRAMUSCULAR | Status: DC | PRN
  Administered 2017-11-24 (×3): 50 ug via INTRAVENOUS

## 2017-11-24 MED ORDER — DEXTROSE 5 % IV SOLN
2.0000 g | Freq: Once | INTRAVENOUS | Status: AC
  Administered 2017-11-24: 2 g via INTRAVENOUS
  Filled 2017-11-24: qty 2

## 2017-11-24 MED ORDER — SODIUM CHLORIDE 0.9 % IR SOLN
Status: DC | PRN
  Administered 2017-11-24: 3000 mL

## 2017-11-24 MED ORDER — DEXAMETHASONE SODIUM PHOSPHATE 10 MG/ML IJ SOLN
INTRAMUSCULAR | Status: DC | PRN
  Administered 2017-11-24: 10 mg via INTRAVENOUS

## 2017-11-24 MED ORDER — PROPOFOL 10 MG/ML IV BOLUS
INTRAVENOUS | Status: AC
  Filled 2017-11-24: qty 20

## 2017-11-24 MED ORDER — OXYCODONE HCL 5 MG/5ML PO SOLN
5.0000 mg | Freq: Once | ORAL | Status: DC | PRN
  Filled 2017-11-24: qty 5

## 2017-11-24 MED ORDER — PROPOFOL 10 MG/ML IV BOLUS
INTRAVENOUS | Status: DC | PRN
  Administered 2017-11-24: 200 mg via INTRAVENOUS

## 2017-11-24 MED ORDER — ONDANSETRON HCL 4 MG/2ML IJ SOLN: 4.0000 mg | Freq: Four times a day (QID) | INTRAMUSCULAR | Status: DC | PRN

## 2017-11-24 MED ORDER — FENTANYL CITRATE (PF) 100 MCG/2ML IJ SOLN
INTRAMUSCULAR | Status: AC
  Administered 2017-11-24: 50 ug via INTRAVENOUS
  Filled 2017-11-24: qty 2

## 2017-11-24 MED ORDER — FENTANYL CITRATE (PF) 100 MCG/2ML IJ SOLN
25.0000 ug | INTRAMUSCULAR | Status: DC | PRN
  Administered 2017-11-24: 25 ug via INTRAVENOUS
  Administered 2017-11-24: 50 ug via INTRAVENOUS
  Administered 2017-11-24: 25 ug via INTRAVENOUS

## 2017-11-24 SURGICAL SUPPLY — 19 items
BAG URO CATCHER STRL LF (MISCELLANEOUS) ×2 IMPLANT
BASKET ZERO TIP NITINOL 2.4FR (BASKET) IMPLANT
BSKT STON RTRVL ZERO TP 2.4FR (BASKET)
CATH INTERMIT  6FR 70CM (CATHETERS) ×1 IMPLANT
CLOTH BEACON ORANGE TIMEOUT ST (SAFETY) ×2 IMPLANT
COVER FOOTSWITCH UNIV (MISCELLANEOUS) IMPLANT
COVER SURGICAL LIGHT HANDLE (MISCELLANEOUS) ×1 IMPLANT
FIBER LASER FLEXIVA 365 (UROLOGICAL SUPPLIES) IMPLANT
FIBER LASER TRAC TIP (UROLOGICAL SUPPLIES) IMPLANT
GLOVE BIOGEL M STRL SZ7.5 (GLOVE) ×2 IMPLANT
GOWN STRL REUS W/TWL LRG LVL3 (GOWN DISPOSABLE) ×4 IMPLANT
GUIDEWIRE ANG ZIPWIRE 038X150 (WIRE) IMPLANT
GUIDEWIRE STR DUAL SENSOR (WIRE) ×2 IMPLANT
IV NS 1000ML (IV SOLUTION)
IV NS 1000ML BAXH (IV SOLUTION) ×1 IMPLANT
MANIFOLD NEPTUNE II (INSTRUMENTS) ×2 IMPLANT
PACK CYSTO (CUSTOM PROCEDURE TRAY) ×2 IMPLANT
SHEATH URETERAL 12FRX35CM (MISCELLANEOUS) IMPLANT
TUBING CONNECTING 10 (TUBING) ×2 IMPLANT

## 2017-11-24 NOTE — Interval H&P Note (Signed)
History and Physical Interval Note:  11/24/2017 2:35 PM  Bruce Ball  has presented today for surgery, with the diagnosis of RIGHT URETERAL CALCULUS  The various methods of treatment have been discussed with the patient and family. After consideration of risks, benefits and other options for treatment, the patient has consented to  Procedure(s): CYSTOSCOPY/URETEROSCOPY/HOLMIUM LASER (Right) as a surgical intervention .  The patient's history has been reviewed, patient examined, no change in status, stable for surgery.  I have reviewed the patient's chart and labs.  Questions were answered to the patient's satisfaction.     Blayke Cordrey,LES

## 2017-11-24 NOTE — Transfer of Care (Signed)
Immediate Anesthesia Transfer of Care Note  Patient: Bruce Ball  Procedure(s) Performed: CYSTOSCOPY/URETEROSCOPY/ RETROGRADE/STENT REMOVAL (Right )  Patient Location: PACU  Anesthesia Type:General  Level of Consciousness: awake, alert  and oriented  Airway & Oxygen Therapy: Patient Spontanous Breathing and Patient connected to face mask oxygen  Post-op Assessment: Report given to RN and Post -op Vital signs reviewed and stable  Post vital signs: Reviewed and stable  Last Vitals:  Vitals:   11/24/17 1406 11/24/17 1541  BP: 132/77 (!) (P) 147/95  Pulse: (!) 57 (P) 66  Resp: 18   Temp: 36.8 C (P) 36.4 C  SpO2: 96% (P) 100%    Last Pain:  Vitals:   11/24/17 1418  TempSrc:   PainSc: 6       Patients Stated Pain Goal: 4 (38/38/18 4037)  Complications: No apparent anesthesia complications

## 2017-11-24 NOTE — Anesthesia Preprocedure Evaluation (Signed)
Anesthesia Evaluation  Patient identified by MRN, date of birth, ID band Patient awake    Reviewed: Allergy & Precautions, H&P , NPO status , Patient's Chart, lab work & pertinent test results  Airway Mallampati: II   Neck ROM: full    Dental   Pulmonary asthma , former smoker,    breath sounds clear to auscultation       Cardiovascular hypertension,  Rhythm:regular Rate:Normal     Neuro/Psych    GI/Hepatic   Endo/Other    Renal/GU    Ureteral stone    Musculoskeletal  (+) Arthritis , Ankylosing spondylitis   Abdominal   Peds  Hematology   Anesthesia Other Findings   Reproductive/Obstetrics                             Anesthesia Physical Anesthesia Plan  ASA: II  Anesthesia Plan: General   Post-op Pain Management:    Induction: Intravenous  PONV Risk Score and Plan: 2 and Ondansetron, Dexamethasone and Treatment may vary due to age or medical condition  Airway Management Planned: LMA  Additional Equipment:   Intra-op Plan:   Post-operative Plan:   Informed Consent: I have reviewed the patients History and Physical, chart, labs and discussed the procedure including the risks, benefits and alternatives for the proposed anesthesia with the patient or authorized representative who has indicated his/her understanding and acceptance.     Plan Discussed with: CRNA, Anesthesiologist and Surgeon  Anesthesia Plan Comments:         Anesthesia Quick Evaluation

## 2017-11-24 NOTE — Discharge Instructions (Addendum)
1. You may see some blood in the urine and may have some burning with urination for 48-72 hours. You also may notice that you have to urinate more frequently or urgently after your procedure which is normal.  2. You should call should you develop an inability urinate, fever > 101, persistent nausea and vomiting that prevents you from eating or drinking to stay hydrated.     General Anesthesia, Adult, Care After These instructions provide you with information about caring for yourself after your procedure. Your health care provider may also give you more specific instructions. Your treatment has been planned according to current medical practices, but problems sometimes occur. Call your health care provider if you have any problems or questions after your procedure. What can I expect after the procedure? After the procedure, it is common to have:  Vomiting.  A sore throat.  Mental slowness.  It is common to feel:  Nauseous.  Cold or shivery.  Sleepy.  Tired.  Sore or achy, even in parts of your body where you did not have surgery.  Follow these instructions at home: For at least 24 hours after the procedure:  Do not: ? Participate in activities where you could fall or become injured. ? Drive. ? Use heavy machinery. ? Drink alcohol. ? Take sleeping pills or medicines that cause drowsiness. ? Make important decisions or sign legal documents. ? Take care of children on your own.  Rest. Eating and drinking  If you vomit, drink water, juice, or soup when you can drink without vomiting.  Drink enough fluid to keep your urine clear or pale yellow.  Make sure you have little or no nausea before eating solid foods.  Follow the diet recommended by your health care provider. General instructions  Have a responsible adult stay with you until you are awake and alert.  Return to your normal activities as told by your health care provider. Ask your health care provider what  activities are safe for you.  Take over-the-counter and prescription medicines only as told by your health care provider.  If you smoke, do not smoke without supervision.  Keep all follow-up visits as told by your health care provider. This is important. Contact a health care provider if:  You continue to have nausea or vomiting at home, and medicines are not helpful.  You cannot drink fluids or start eating again.  You cannot urinate after 8-12 hours.  You develop a skin rash.  You have fever.  You have increasing redness at the site of your procedure. Get help right away if:  You have difficulty breathing.  You have chest pain.  You have unexpected bleeding.  You feel that you are having a life-threatening or urgent problem. This information is not intended to replace advice given to you by your health care provider. Make sure you discuss any questions you have with your health care provider. Document Released: 03/24/2001 Document Revised: 05/20/2016 Document Reviewed: 11/30/2015 Elsevier Interactive Patient Education  [employer].

## 2017-11-24 NOTE — Anesthesia Postprocedure Evaluation (Signed)
Anesthesia Post Note  Patient: Bruce Ball  Procedure(s) Performed: CYSTOSCOPY/URETEROSCOPY/ RETROGRADE/STENT REMOVAL (Right )     Patient location during evaluation: PACU Anesthesia Type: General Level of consciousness: awake and alert Pain management: pain level controlled Vital Signs Assessment: post-procedure vital signs reviewed and stable Respiratory status: spontaneous breathing, nonlabored ventilation, respiratory function stable and patient connected to nasal cannula oxygen Cardiovascular status: blood pressure returned to baseline and stable Postop Assessment: no apparent nausea or vomiting Anesthetic complications: no    Last Vitals:  Vitals:   11/24/17 1627 11/24/17 1705  BP: 140/77 (!) 158/71  Pulse: (!) 57 (!) 56  Resp: 16 16  Temp: 36.5 C 36.6 C  SpO2: 97% 97%    Last Pain:  Vitals:   11/24/17 1705  TempSrc: Oral  PainSc: [place]

## 2017-11-24 NOTE — Op Note (Signed)
Preoperative diagnosis: Right ureteral calculi  Postoperative diagnosis: History of right ureteral calculi  Procedures: 1.  Cystoscopy 2.  Right retrograde pyelography with interpretation 3.  Right ureteroscopy 4.  Right ureteral stent removal  Surgeon: Pryor Curia MD  Anesthesia: General  Complications: None  EBL: Minimal  Intraoperative findings: Right retrograde pyelography demonstrated no obvious filling defects within the proximal ureter and no significant hydronephrosis.  Indication: Bruce Ball is a 71 year old gentleman with a long-standing history of recurrent urolithiasis.  He recently presented with distal right ureteral calculi with obstruction and fever concerning for infection.  He underwent urgent right ureteral stent placement for management and was on appropriate antibiotic therapy.  He presents today for definitive management.  He has not noted passing any stones.  He has not been tolerating his stent very well.  He presents to proceed with definitive ureteroscopic treatment of his stones.  We reviewed the proposed procedures and the risks, complications, and expected recovery process associated with him.  He gave informed consent to proceed.  Description of procedure: The patient was taken to the operating room and a general anesthetic was administered.  He was given preoperative antibiotics, placed in the dorsal lithotomy position, and prepped and draped in the usual sterile fashion.  Next, a preoperative timeout was performed.  Cystourethroscopy was then performed with a 30 degree lens.  This demonstrated a normal urethra.  The prostatic urethra was surgically absent.  Inspection of the bladder revealed no bladder tumors, stones, or other significant mucosal pathology aside from expected edema around the right ureteral stent that was extending out of the right ureteral orifice.  This stent was then grasped and brought out to the urethral meatus.  A 0.038 Sensor  guidewire was then advanced through the stent and up into the right renal collecting system under fluoroscopic guidance.  The 6 French semirigid ureteroscope was then advanced next to the guidewire up into the right ureter.  The entire distal and mid ureter was examined without any obvious stones identified.  Omnipaque contrast was then injected through the ureteroscope to the proximal ureter and there were no filling defects in the proximal ureter to suggest any ureteral calculi.  The ureteroscope was gradually withdrawn again inspecting the ureter and again noting no remaining calculi.  The guidewire was then removed.  The bladder was emptied.  The patient tolerated the procedure well without complications.  He was able to be transferred to the recovery unit in satisfactory condition.

## 2017-11-24 NOTE — Anesthesia Procedure Notes (Signed)
Procedure Name: Intubation Performed by: Gean Maidens, CRNA Pre-anesthesia Checklist: Patient identified, Emergency Drugs available, Suction available, Patient being monitored and Timeout performed Patient Re-evaluated:Patient Re-evaluated prior to induction Oxygen Delivery Method: Circle system utilized Preoxygenation: Pre-oxygenation with 100% oxygen Induction Type: IV induction Ventilation: Mask ventilation without difficulty LMA: LMA inserted LMA Size: 4.0 Number of attempts: 1 Tube secured with: Tape Dental Injury: Teeth and Oropharynx as per pre-operative assessment

## 2017-12-11 ENCOUNTER — Encounter: Payer: Self-pay | Admitting: Family Medicine

## 2018-01-02 DIAGNOSIS — N200 Calculus of kidney: Secondary | ICD-10-CM | POA: Diagnosis not present

## 2018-01-02 DIAGNOSIS — N202 Calculus of kidney with calculus of ureter: Secondary | ICD-10-CM | POA: Diagnosis not present

## 2018-01-22 DIAGNOSIS — N5201 Erectile dysfunction due to arterial insufficiency: Secondary | ICD-10-CM | POA: Diagnosis not present

## 2018-04-10 LAB — CBC AND DIFFERENTIAL
Hemoglobin: 15.2 (ref 13.5–17.5)
Platelets: 213 (ref 150–399)

## 2018-04-10 LAB — BASIC METABOLIC PANEL
BUN: 19 (ref 4–21)
Creatinine: 1.3 (ref 0.6–1.3)

## 2018-04-23 ENCOUNTER — Encounter: Payer: Self-pay | Admitting: Family Medicine

## 2018-07-30 ENCOUNTER — Emergency Department (HOSPITAL_BASED_OUTPATIENT_CLINIC_OR_DEPARTMENT_OTHER): Payer: Medicare Other

## 2018-07-30 ENCOUNTER — Encounter (HOSPITAL_BASED_OUTPATIENT_CLINIC_OR_DEPARTMENT_OTHER): Payer: Self-pay | Admitting: Emergency Medicine

## 2018-07-30 ENCOUNTER — Emergency Department (HOSPITAL_COMMUNITY): Payer: Medicare Other

## 2018-07-30 ENCOUNTER — Emergency Department (HOSPITAL_BASED_OUTPATIENT_CLINIC_OR_DEPARTMENT_OTHER)
Admission: EM | Admit: 2018-07-30 | Discharge: 2018-07-30 | Disposition: A | Discharge status: Home / Self Care | Payer: Medicare Other | Attending: Emergency Medicine | Admitting: Emergency Medicine

## 2018-07-30 ENCOUNTER — Other Ambulatory Visit: Payer: Self-pay

## 2018-07-30 DIAGNOSIS — Z87891 Personal history of nicotine dependence: Secondary | ICD-10-CM | POA: Insufficient documentation

## 2018-07-30 DIAGNOSIS — Z7982 Long term (current) use of aspirin: Secondary | ICD-10-CM | POA: Insufficient documentation

## 2018-07-30 DIAGNOSIS — G8929 Other chronic pain: Secondary | ICD-10-CM | POA: Diagnosis not present

## 2018-07-30 DIAGNOSIS — Z79899 Other long term (current) drug therapy: Secondary | ICD-10-CM | POA: Diagnosis not present

## 2018-07-30 DIAGNOSIS — M5416 Radiculopathy, lumbar region: Secondary | ICD-10-CM | POA: Insufficient documentation

## 2018-07-30 DIAGNOSIS — I10 Essential (primary) hypertension: Secondary | ICD-10-CM | POA: Insufficient documentation

## 2018-07-30 DIAGNOSIS — M79604 Pain in right leg: Secondary | ICD-10-CM | POA: Diagnosis not present

## 2018-07-30 HISTORY — DX: Dorsalgia, unspecified: M54.9

## 2018-07-30 HISTORY — DX: Other chronic pain: G89.29

## 2018-07-30 HISTORY — DX: Pain in leg, unspecified: M79.606

## 2018-07-30 MED ORDER — OXYCODONE-ACETAMINOPHEN 5-325 MG PO TABS: 1.0000 | ORAL_TABLET | Freq: Three times a day (TID) | ORAL | 0 refills | Status: DC | PRN

## 2018-07-30 MED ORDER — OXYCODONE-ACETAMINOPHEN 5-325 MG PO TABS
1.0000 | ORAL_TABLET | Freq: Once | ORAL | Status: AC
  Administered 2018-07-30: 1 via ORAL
  Filled 2018-07-30: qty 1

## 2018-07-30 MED ORDER — OXYCODONE-ACETAMINOPHEN 5-325 MG PO TABS
1.0000 | ORAL_TABLET | Freq: Once | ORAL | Status: AC
Start: 2018-07-30 — End: 2018-07-30
  Administered 2018-07-30: 1 via ORAL
  Filled 2018-07-30: qty 1

## 2018-07-30 NOTE — ED Provider Notes (Signed)
[facility] EMERGENCY DEPARTMENT Provider Note   CSN: [id number] Arrival date & time: 07/30/18  0920     History   Chief Complaint Chief Complaint  Patient presents with  . Leg Pain    HPI Bruce Ball is a 72 y.o. male.  The history is provided by the patient. No language interpreter was used.  Leg Pain     Bruce Ball is a 72 y.o. male who presents to the Emergency Department complaining of leg pain. He presents to the emergency department for complaints of right leg pain for the last three weeks. Pain is described as severe and constant nature. It is worse with walking. No reports of injuries to his leg. He does frequently travel and states that the leg feels swollen by the end of the day. He denies any fevers, chest pain, shortness of breath, nausea, vomiting. He has a history of ankylosing spondylitis and is on daily prednisone and has chronic back pain. His back pain is unchanged from baseline. He also has a remote history of bone cancer and the right leg status post resection in the 70s. No history of DVT.  He feels like he has numbness in the distal right leg and has been dragging his right foot for a few weeks.   Past Medical History:  Diagnosis Date  . Chronic back pain greater than 3 months duration   . Chronic leg pain   . Extrinsic asthma, unspecified 04/07/2009  . History of kidney stones   . HYPERTENSION 10/29/2007  . HYPOTENSION 08/13/2010  . LOW BACK PAIN 04/21/2008  . NEPHROLITHIASIS, HX OF 04/21/2008  . PUD, HX OF 04/21/2008    Patient Active Problem List   Diagnosis Date Noted  . Hyperlipidemia 11/11/2017  . Community acquired pneumonia of right upper lobe of lung ([place]) 11/05/2017  . Right ureteral stone 11/05/2017  . History of total right hip replacement 03/08/2017  . History of prostate cancer 03/08/2017  . 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 High risk medication use 02/28/2017  . Spondylosis of lumbar region without myelopathy or  radiculopathy 02/28/2017  . DDD (degenerative disc disease), thoracic 02/28/2017  . Ataxia 05/18/2016  . Vestibular disequilibrium 05/18/2016  . Ankylosing spondylitis ([place]) 05/24/2014  . Lung nodule, solitary 05/24/2014  . Peripheral edema 06/08/2012  . EXTRINSIC ASTHMA, UNSPECIFIED 04/07/2009  . LOW BACK PAIN 04/21/2008  . History of peptic ulcer disease 04/21/2008  . Essential hypertension 10/29/2007    Past Surgical History:  Procedure Laterality Date  . bone cancer     rt femur removed and steel rod placed  . CYSTOSCOPY WITH RETROGRADE PYELOGRAM, URETEROSCOPY AND STENT PLACEMENT Right 11/06/2017   Procedure: CYSTOSCOPY WITH  STENT PLACEMENT RIGHT;  Surgeon: Raynelle Bring, MD;  Location: [facility];  Service: Urology;  Laterality: Right;  . CYSTOSCOPY/RETROGRADE/URETEROSCOPY     1 year ago at [facility] in [place]  . CYSTOSCOPY/URETEROSCOPY/HOLMIUM LASER Right 11/24/2017   Procedure: CYSTOSCOPY/URETEROSCOPY/ RETROGRADE/STENT REMOVAL;  Surgeon: Raynelle Bring, MD;  Location: [facility];  Service: Urology;  Laterality: Right;  . prosatectomy          Home Medications    Prior to Admission medications   Medication Sig Start Date End Date Taking? Authorizing Provider  oxyCODONE-acetaminophen (PERCOCET/ROXICET) 5-325 MG tablet Take 1 tablet by mouth 3 (three) times daily as needed for severe pain.   Yes [provider]  amLODipine (NORVASC) 10 MG tablet Take 10 mg at bedtime by mouth.     [provider]  aspirin EC 81 MG tablet Take 81 mg daily by mouth.    [provider]  atorvastatin (LIPITOR) 20 MG tablet Take 20 mg at bedtime by mouth.     [provider]  carvedilol (COREG) 25 MG tablet Take 25 mg 2 (two) times daily by mouth.     [provider]  Cholecalciferol (VITAMIN D) 2000 units CAPS Take 2,000 Units 2 (two) times daily by mouth.     [provider]  isosorbide mononitrate (IMDUR) 30 MG 24 hr tablet Take 30 mg at bedtime  by mouth.     [provider]  omeprazole (PRILOSEC) 20 MG capsule Take 20 mg by mouth at bedtime.     [provider]  predniSONE (DELTASONE) 5 MG tablet Take 1 tablet (5 mg total) by mouth daily with breakfast. Resume once you have completed a taper of prednisone as instructed 05/19/16   Ghimire, Henreitta Leber, MD  traMADol (ULTRAM) 50 MG tablet Take 50 mg 2 (two) times daily as needed by mouth for severe pain.    [provider]    Family History Family History  Problem Relation Age of Onset  . COPD Father   . Cancer Other        prostate    Social History Social History   Tobacco Use  . Smoking status: Former Smoker    Last attempt to quit: 12/30/1978    Years since quitting: 39.6  . Smokeless tobacco: Never Used  Substance Use Topics  . Alcohol use: No  . Drug use: No     Allergies   Patient has no known allergies.   Review of Systems Review of Systems  All other systems reviewed and are negative.    Physical Exam Updated Vital Signs BP (!) 146/80   Pulse (!) 55   Temp 98.2 F (36.8 C)   Resp 16   Ht 5\' 11"  (1.803 m)   Wt 108.9 kg (240 lb)   SpO2 99%   BMI 33.47 kg/m   Physical Exam  Constitutional: He is oriented to person, place, and time. He appears well-developed and well-nourished.  HENT:  Head: Normocephalic and atraumatic.  Cardiovascular: Normal rate and regular rhythm.  Pulmonary/Chest: Effort normal. No respiratory distress.  Abdominal: Soft. There is no tenderness. There is no rebound and no guarding.  Musculoskeletal:  2+ Dp pulses bilaterally.  Trace edema to BLE.  No erythema to BLE.  TTP throughout BLE from knee to ankle.   Neurological: He is alert and oriented to person, place, and time.  5/5 strength in BLE with sensation to light touch intact in BLE.   Skin: Skin is warm and dry.  Psychiatric: He has a normal mood and affect. His behavior is normal.  Nursing note and vitals reviewed.    ED Treatments /  Results  Labs (all labs ordered are listed, but only abnormal results are displayed) Labs Reviewed - No data to display  EKG None  Radiology No results found.  Procedures Procedures (including critical care time)  Medications Ordered in ED Medications - No data to display   Initial Impression / Assessment and Plan / ED Course  I have reviewed the triage vital signs and the nursing notes.  Pertinent labs & imaging results that were available during my care of the patient were reviewed by me and considered in my medical decision making (see chart for details).     Patient with history of ankylosing spondylitis here for  evaluation of right leg pain. He is neurologically intact on examination but does state that he is been dragging the leg. There is no evidence of occult fracture, DVT. He is well perfused on exam with no evidence of cellulitis. Given his history of ankylosing spondylitis, chronic steroids plan to obtain MRI to further evaluate for possible nerve injury. Plan to transfer by private vehicle to the emergency department at [facility] for further imaging. Discussed with Dr. Lita Mains who accepts the patient and transfer.  Final Clinical Impressions(s) / ED Diagnoses   Final diagnoses:  None    ED Discharge Orders    None       Quintella Reichert, MD 07/30/18 1150

## 2018-07-30 NOTE — ED Triage Notes (Addendum)
Pt having right leg/calf pain for about two weeks.  Pt states it is worsening.  Pt has traveled a lot recently.  No bruising noted.  Bilateral ankle swelling.  Skin warm to touch.  Denies chest pain or SOB

## 2018-07-30 NOTE — ED Provider Notes (Signed)
[facility] EMERGENCY DEPARTMENT Provider Note   CSN: [id number] Arrival date & time: 07/30/18  0920     History   Chief Complaint Chief Complaint  Patient presents with  . Leg Pain    HPI Bruce Ball is a 72 y.o. male.  HPI Patient was evaluated in the emergency department at [facility] earlier today.  Was presenting with right leg pain, numbness worsening over the last 2 to 3 weeks.  Had episodic swelling to the leg.  Denies shortness of breath or chest pain.  Denies fever or chills.  Has chronic low back pain which is unchanged recently.  Denies incontinence.  Has been using a walker to get around.  Had x-rays and DVT study performed with no acute abnormality.  Was transferred to the most common emergency department for MRI of the lumbar spine. Past Medical History:  Diagnosis Date  . Chronic back pain greater than 3 months duration   . Chronic leg pain   . Extrinsic asthma, unspecified 04/07/2009  . History of kidney stones   . HYPERTENSION 10/29/2007  . HYPOTENSION 08/13/2010  . LOW BACK PAIN 04/21/2008  . NEPHROLITHIASIS, HX OF 04/21/2008  . PUD, HX OF 04/21/2008    Patient Active Problem List   Diagnosis Date Noted  . Hyperlipidemia 11/11/2017  . Community acquired pneumonia of right upper lobe of lung ([place]) 11/05/2017  . Right ureteral stone 11/05/2017  . History of total right hip replacement 03/08/2017  . History of prostate cancer 03/08/2017  . 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 High risk medication use 02/28/2017  . Spondylosis of lumbar region without myelopathy or radiculopathy 02/28/2017  . DDD (degenerative disc disease), thoracic 02/28/2017  . Ataxia 05/18/2016  . Vestibular disequilibrium 05/18/2016  . Ankylosing spondylitis ([place]) 05/24/2014  . Lung nodule, solitary 05/24/2014  . Peripheral edema 06/08/2012  . EXTRINSIC ASTHMA, UNSPECIFIED 04/07/2009  . LOW BACK PAIN 04/21/2008  . History of peptic ulcer disease  04/21/2008  . Essential hypertension 10/29/2007    Past Surgical History:  Procedure Laterality Date  . bone cancer     rt femur removed and steel rod placed  . CYSTOSCOPY WITH RETROGRADE PYELOGRAM, URETEROSCOPY AND STENT PLACEMENT Right 11/06/2017   Procedure: CYSTOSCOPY WITH  STENT PLACEMENT RIGHT;  Surgeon: Raynelle Bring, MD;  Location: [facility];  Service: Urology;  Laterality: Right;  . CYSTOSCOPY/RETROGRADE/URETEROSCOPY     1 year ago at [place] in [place]  . CYSTOSCOPY/URETEROSCOPY/HOLMIUM LASER Right 11/24/2017   Procedure: CYSTOSCOPY/URETEROSCOPY/ RETROGRADE/STENT REMOVAL;  Surgeon: Raynelle Bring, MD;  Location: [facility];  Service: Urology;  Laterality: Right;  . prosatectomy          Home Medications    Prior to Admission medications   Medication Sig Start Date End Date Taking? Authorizing Provider  amLODipine (NORVASC) 10 MG tablet Take 10 mg at bedtime by mouth.     [provider]  aspirin EC 81 MG tablet Take 81 mg daily by mouth.    [provider]  atorvastatin (LIPITOR) 20 MG tablet Take 20 mg at bedtime by mouth.     [provider]  carvedilol (COREG) 25 MG tablet Take 25 mg 2 (two) times daily by mouth.     [provider]  Cholecalciferol (VITAMIN D) 2000 units CAPS Take 2,000 Units 2 (two) times daily by mouth.     [provider]  isosorbide mononitrate (IMDUR) 30 MG 24 hr tablet Take 30 mg at bedtime by mouth.  [provider]  omeprazole (PRILOSEC) 20 MG capsule Take 20 mg by mouth at bedtime.     [provider]  oxyCODONE-acetaminophen (PERCOCET/ROXICET) 5-325 MG tablet Take 1 tablet by mouth 3 (three) times daily as needed for severe pain. 07/30/18   Julianne Rice, MD  predniSONE (DELTASONE) 5 MG tablet Take 1 tablet (5 mg total) by mouth daily with breakfast. Resume once you have completed a taper of prednisone as instructed 05/19/16   Ghimire, Henreitta Leber, MD  traMADol (ULTRAM) 50 MG tablet Take  50 mg 2 (two) times daily as needed by mouth for severe pain.    [provider]    Family History Family History  Problem Relation Age of Onset  . COPD Father   . Cancer Other        prostate    Social History Social History   Tobacco Use  . Smoking status: Former Smoker    Last attempt to quit: 12/30/1978    Years since quitting: 39.6  . Smokeless tobacco: Never Used  Substance Use Topics  . Alcohol use: No  . Drug use: No     Allergies   Patient has no known allergies.   Review of Systems Review of Systems  Constitutional: Negative for chills and fever.  Eyes: Negative for visual disturbance.  Respiratory: Negative for cough and shortness of breath.   Cardiovascular: Negative for chest pain, palpitations and leg swelling.  Gastrointestinal: Negative for abdominal pain, blood in stool, constipation, diarrhea, nausea and vomiting.  Genitourinary: Negative for difficulty urinating, dysuria, flank pain and frequency.  Musculoskeletal: Positive for gait problem and myalgias. Negative for back pain, neck pain and neck stiffness.  Skin: Negative for rash and wound.  Neurological: Positive for numbness. Negative for dizziness, tremors, syncope, weakness, light-headedness and headaches.  All other systems reviewed and are negative.    Physical Exam Updated Vital Signs BP (!) 148/87   Pulse 64   Temp 98.2 F (36.8 C)   Resp 18   Ht 5\' 11"  (1.803 m)   Wt 108.9 kg (240 lb)   SpO2 97%   BMI 33.47 kg/m   Physical Exam  Constitutional: He is oriented to person, place, and time. He appears well-developed and well-nourished. No distress.  HENT:  Head: Normocephalic and atraumatic.  Mouth/Throat: Oropharynx is clear and moist. No oropharyngeal exudate.  Eyes: Pupils are equal, round, and reactive to light. EOM are normal.  Neck: Normal range of motion. Neck supple.  Cardiovascular: Normal rate and regular rhythm.  Pulmonary/Chest: Effort normal and breath  sounds normal. No stridor. No respiratory distress. He has no wheezes. He has no rales. He exhibits no tenderness.  Abdominal: Soft. Bowel sounds are normal. There is no tenderness. There is no rebound and no guarding.  Musculoskeletal: Normal range of motion. He exhibits no edema or tenderness.  No midline thoracic lumbar tenderness.  Dorsalis pedis and posterior tibial pulses are 2+.  No calf tenderness or obvious swelling.  Neurological: He is alert and oriented to person, place, and time.  Limited range of motion of the right hip from previous hip replacement surgery.  Otherwise 5/5 motor bilateral lower extremities.  Decreased sensation to the right lateral foot and lower leg.  Skin: Skin is warm and dry. Capillary refill takes less than 2 seconds. No rash noted. He is not diaphoretic. No erythema.  Psychiatric: He has a normal mood and affect. His behavior is normal.  Nursing note and vitals reviewed.    ED  Treatments / Results  Labs (all labs ordered are listed, but only abnormal results are displayed) Labs Reviewed - No data to display  EKG None  Radiology Dg Tibia/fibula Right  Result Date: 07/30/2018 CLINICAL DATA:  Pain right lower extremity. EXAM: RIGHT TIBIA AND FIBULA - 2 VIEW COMPARISON:  03/06/2007. FINDINGS: No acute bony or joint abnormality identified. No evidence of fracture or dislocation. Deformity noted of the right fibula possibly from old injury. IMPRESSION: 1.  No acute abnormality. 2.  Deformity of the right fibula, possibly from old injury. Electronically Signed   By: Marcello Moores  Register   On: 07/30/2018 11:15   Mr Lumbar Spine Wo Contrast  Result Date: 07/30/2018 CLINICAL DATA:  Right leg pain 3 weeks. EXAM: MRI LUMBAR SPINE WITHOUT CONTRAST TECHNIQUE: Multiplanar, multisequence MR imaging of the lumbar spine was performed. No intravenous contrast was administered. COMPARISON:  MRI lumbar spine 03/16/2017 FINDINGS: Segmentation:  Normal Alignment:  Normal Vertebrae:   Negative for fracture or mass. Conus medullaris and cauda equina: Conus extends to the L1-2 level. Conus and cauda equina appear normal. Paraspinal and other soft tissues: Negative for paraspinous mass or edema. Marked atrophy of right psoas and iliac muscle and gluteal muscles which has progressed since the prior MRI. Disc levels: L1-2: Mild disc degeneration L2-3: Mild disc and mild facet degeneration without stenosis L3-4: Mild disc and mild facet degeneration without stenosis L4-5: Diffuse disc bulging. Mild facet degeneration. Mild subarticular stenosis on the right. L5-S1: Small disc protrusion in the right foramen. Probable extruded disc fragment extending cranial and compressing the right L5 nerve root. This was not present previously. Mild subarticular stenosis bilaterally. IMPRESSION: Small extruded disc fragment extending into the right foramen at L5-S1 with compression of the right L5 nerve root. Marked atrophy of right iliac and psoas and right gluteal muscles with progression since the prior MRI. Electronically Signed   By: Franchot Gallo M.D.   On: 07/30/2018 14:50   US Venous Img Lower Unilateral Right  Result Date: 07/30/2018 CLINICAL DATA:  Right leg pain for 2 weeks. EXAM: RIGHT LOWER EXTREMITY VENOUS DUPLEX ULTRASOUND TECHNIQUE: Doppler venous assessment of the right lower extremity deep venous system was performed, including characterization of spectral flow, compressibility, and phasicity. COMPARISON:  None. FINDINGS: There is complete compressibility of the right common femoral, femoral, and popliteal veins. Doppler analysis demonstrates respiratory phasicity and augmentation of flow with calf compression. No obvious superficial vein or calf vein thrombosis. IMPRESSION: No evidence of right lower extremity DVT. Electronically Signed   By: Marybelle Killings M.D.   On: 07/30/2018 11:03    Procedures Procedures (including critical care time)  Medications Ordered in ED Medications    oxyCODONE-acetaminophen (PERCOCET/ROXICET) 5-325 MG per tablet 1 tablet (1 tablet Oral Given 07/30/18 1157)  oxyCODONE-acetaminophen (PERCOCET/ROXICET) 5-325 MG per tablet 1 tablet (1 tablet Oral Given 07/30/18 1518)     Initial Impression / Assessment and Plan / ED Course  I have reviewed the triage vital signs and the nursing notes.  Pertinent labs & imaging results that were available during my care of the patient were reviewed by me and considered in my medical decision making (see chart for details).     MRI with nerve impingement at the L5/S1 level on the right.  Consistent with patient's symptoms.  Will treat symptomatically and have follow-up with neurosurgery.  Final Clinical Impressions(s) / ED Diagnoses   Final diagnoses:  Lumbar radiculopathy    ED Discharge Orders  Ordered    oxyCODONE-acetaminophen (PERCOCET/ROXICET) 5-325 MG tablet  3 times daily PRN     07/30/18 1524       Julianne Rice, MD 07/30/18 1529

## 2018-07-30 NOTE — ED Notes (Signed)
Pt transported to MRI 

## 2018-08-04 DIAGNOSIS — M546 Pain in thoracic spine: Secondary | ICD-10-CM | POA: Diagnosis not present

## 2018-08-04 DIAGNOSIS — M549 Dorsalgia, unspecified: Secondary | ICD-10-CM | POA: Diagnosis not present

## 2018-08-04 DIAGNOSIS — M5136 Other intervertebral disc degeneration, lumbar region: Secondary | ICD-10-CM | POA: Diagnosis not present

## 2018-08-04 DIAGNOSIS — M5416 Radiculopathy, lumbar region: Secondary | ICD-10-CM | POA: Diagnosis not present

## 2018-08-04 DIAGNOSIS — M5126 Other intervertebral disc displacement, lumbar region: Secondary | ICD-10-CM | POA: Diagnosis not present

## 2018-08-04 DIAGNOSIS — Z6832 Body mass index (BMI) 32.0-32.9, adult: Secondary | ICD-10-CM | POA: Diagnosis not present

## 2018-08-04 DIAGNOSIS — M47816 Spondylosis without myelopathy or radiculopathy, lumbar region: Secondary | ICD-10-CM | POA: Diagnosis not present

## 2018-08-06 DIAGNOSIS — M5116 Intervertebral disc disorders with radiculopathy, lumbar region: Secondary | ICD-10-CM | POA: Diagnosis not present

## 2018-08-06 DIAGNOSIS — M5126 Other intervertebral disc displacement, lumbar region: Secondary | ICD-10-CM | POA: Diagnosis not present

## 2018-08-06 DIAGNOSIS — M4726 Other spondylosis with radiculopathy, lumbar region: Secondary | ICD-10-CM | POA: Diagnosis not present

## 2018-08-06 DIAGNOSIS — M5117 Intervertebral disc disorders with radiculopathy, lumbosacral region: Secondary | ICD-10-CM | POA: Diagnosis not present

## 2018-08-06 DIAGNOSIS — M4727 Other spondylosis with radiculopathy, lumbosacral region: Secondary | ICD-10-CM | POA: Diagnosis not present

## 2018-09-15 ENCOUNTER — Ambulatory Visit: Payer: Medicare Other | Attending: Neurosurgery | Admitting: Physical Therapy

## 2018-09-15 ENCOUNTER — Other Ambulatory Visit: Payer: Self-pay

## 2018-09-15 ENCOUNTER — Encounter: Payer: Self-pay | Admitting: Physical Therapy

## 2018-09-15 DIAGNOSIS — M6281 Muscle weakness (generalized): Secondary | ICD-10-CM | POA: Diagnosis not present

## 2018-09-15 DIAGNOSIS — M5441 Lumbago with sciatica, right side: Secondary | ICD-10-CM | POA: Diagnosis not present

## 2018-09-15 DIAGNOSIS — R262 Difficulty in walking, not elsewhere classified: Secondary | ICD-10-CM | POA: Diagnosis not present

## 2018-09-15 NOTE — Therapy (Signed)
[facility] [address], [place], [place], [address] Phone: [phone]   Fax:  [phone]  Physical Therapy Evaluation  Patient Details  Name: Bruce Ball MRN: [id number] Date of Birth: 23-Nov-1946 Referring Provider: Dr. Sherwood Gambler    Encounter Date: 09/15/2018  PT End of Session - 09/15/18 2116    Visit Number  1    Date for PT Re-Evaluation  11/10/18    Authorization Type  Medicare KX at visit 15    PT Start Time  1100    PT Stop Time  1145    PT Time Calculation (min)  45 min    Activity Tolerance  Patient limited by pain       Past Medical History:  Diagnosis Date  . Chronic back pain greater than 3 months duration   . Chronic leg pain   . Extrinsic asthma, unspecified 04/07/2009  . History of kidney stones   . HYPERTENSION 10/29/2007  . HYPOTENSION 08/13/2010  . LOW BACK PAIN 04/21/2008  . NEPHROLITHIASIS, HX OF 04/21/2008  . PUD, HX OF 04/21/2008    Past Surgical History:  Procedure Laterality Date  . bone cancer     rt femur removed and steel rod placed  . CYSTOSCOPY WITH RETROGRADE PYELOGRAM, URETEROSCOPY AND STENT PLACEMENT Right 11/06/2017   Procedure: CYSTOSCOPY WITH  STENT PLACEMENT RIGHT;  Surgeon: Raynelle Bring, MD;  Location: [facility];  Service: Urology;  Laterality: Right;  . CYSTOSCOPY/RETROGRADE/URETEROSCOPY     1 year ago at [place] in [place]  . CYSTOSCOPY/URETEROSCOPY/HOLMIUM LASER Right 11/24/2017   Procedure: CYSTOSCOPY/URETEROSCOPY/ RETROGRADE/STENT REMOVAL;  Surgeon: Raynelle Bring, MD;  Location: [facility];  Service: Urology;  Laterality: Right;  . prosatectomy      There were no vitals filed for this visit.   Subjective Assessment - 09/15/18 1105    Subjective  In July started having right lower leg pain and was diagnosed with varicose veins and was given oxycodone.  Went to ED and was sent to [facility].  Dr. Sherwood Gambler diagnosed with extruded disc L5 affecting LE symptoms.   Had  lumbar discectomy in early August.  Initially things went well but recently right lower leg symptoms started again.  Initially using walker but now Using [facility]  and nothing sometimes.      Pertinent History  Hardware right femur 49 years;  back pain for 49 years with chronic pain but able to walk without assistive device;  very hard of hearing ;  lumbar discectomy early Aug    Limitations  Walking;House hold activities    How long can you sit comfortably?  as long as I want now but initially difficult    How long can you walk comfortably?  10-15 min with walker;  5 min with cane; 2 min without assistive device ;  scooter for [employer]    Diagnostic tests  MRI extruded disc compressing nerve root    Patient Stated Goals  get rid of residual right lower leg pain;  be able to walk without device;  travel     Currently in Pain?  Yes    Pain Score  6     Pain Location  Leg    Pain Orientation  Right    Pain Frequency  Intermittent    Aggravating Factors   standing; weight bearing on right;  walking     Pain Relieving Factors  recliner; sleep with pillow between knees         [facility] PT  Assessment - 09/15/18 0001      Assessment   Medical Diagnosis  Lumbar discectomy     Referring Provider  Dr. Sherwood Gambler     Onset Date/Surgical Date  07/30/18    Next MD Visit  November    [facility]      Precautions   Precautions  Back    Precaution Comments  no lift > 25#      Restrictions   Weight Bearing Restrictions  No      Balance Screen   Has the patient fallen in the past 6 months  No    Has the patient had a decrease in activity level because of a fear of falling?   Yes   catch myself often b/c of sharp pain throws off balance   Is the patient reluctant to leave their home because of a fear of falling?   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    Leisure  travel;  meet friends      Observation/Other Assessments   Observations  scar appears well healed externally    Focus on Therapeutic Outcomes (FOTO)   58% limitation       Posture/Postural Control   Posture/Postural Control  Postural limitations    Postural Limitations  Decreased lumbar lordosis      AROM   Right/Left Hip  --   LE ROM very painful in low back    Lumbar Flexion  WFLs   able to pick up object from floor by stooping but painful    Lumbar Extension  10    Lumbar - Right Side Bend  20    Lumbar - Left Side Bend  20      Strength   Right Hip Flexion  3/5    Right Hip Extension  3+/5    Right Hip ABduction  2/5    Left Hip Flexion  4/5    Left Hip Extension  4-/5    Left Hip ABduction  4-/5    Right Knee Flexion  3+/5    Right Knee Extension  3+/5    Left Knee Flexion  4+/5    Left Knee Extension  4+/5    Right Ankle Dorsiflexion  4-/5    Right Ankle Plantar Flexion  4-/5    Right Ankle Inversion  4-/5    Right Ankle Eversion  4-/5    Left Ankle Dorsiflexion  5/5    Left Ankle Plantar Flexion  5/5    Left Ankle Inversion  5/5    Left Ankle Eversion  5/5    Lumbar Flexion  3/5    Lumbar Extension  3/5      Slump test   Findings  Negative      Straight Leg Raise   Findings  Positive    Side   Right      Bed Mobility   Bed Mobility  --   painful with initially lying supine and rolling side to side  Ambulation/Gait   Assistive device  Straight cane    Gait Pattern  Decreased stance time - right;Decreased hip/knee flexion - right;Poor foot clearance - right      Timed Up and Go Test   TUG Comments  to be assessed next visit                 Objective measurements completed on examination: See  above findings.              PT Education - 09/15/18 2115    Education Details  abdominal brace in supine and sitting;  clams small range    Person(s) Educated  Patient    Methods  Explanation;Demonstration;Handout    Comprehension  Returned demonstration;Verbalized understanding       PT Short Term Goals - 09/15/18 2144      PT SHORT TERM GOAL #1   Title  The patient will demonstrate knowledge of initial HEP to improve initial mobility and function     Time  4    Period  Weeks    Status  New    Target Date  10/13/18      PT SHORT TERM GOAL #2   Title  The patient will report a 25% improvement in right LE symptoms with usual ADLs    Time  4    Period  Weeks    Status  New      PT SHORT TERM GOAL #3   Title  The patient will be able to ambulate 10 minutes with cane for short to medium community distances     Time  4    Period  Weeks    Status  New      PT SHORT TERM GOAL #4   Title  The patient will have improved gait speed with Timed up and Go to < 14 sec     Time  4    Period  Weeks    Status  New        PT Long Term Goals - 09/15/18 2148      PT LONG TERM GOAL #1   Title  The patient will be independent with safe self progression of HEP and return to water walking program     Time  8    Period  Weeks    Status  New    Target Date  11/10/18      PT LONG TERM GOAL #2   Title  The patient will report a 50% reduction in LE pain with usual ADLs    Time  8    Period  Weeks    Status  New      PT LONG TERM GOAL #3   Title  The patient will have improved right hip, knee and ankle strength to grossly 3+/5 to 4-/5 needed for improved tolerance of standing and walking    Time  8    Period  Weeks    Status  New      PT LONG TERM GOAL #4   Title  The patient will have improved core strength to ambulate household distances without assistive device    Time  8    Period  Weeks      PT LONG TERM GOAL #5   Title  The patient will be able to walk 15-20  minutes with cane for medium community distances    Time  8    Period  Weeks    Status  New  Additional Long Term Goals   Additional Long Term Goals  Yes      PT LONG TERM GOAL #6   Title  FOTO functional outcome score improved from 58% limitation to 43%.      Time  8    Period  Weeks    Status  New             Plan - 09/15/18 1214    Clinical Impression Statement  The patient began have right lower leg pain in July and underwent lumbar discectomy in early August.  Past medical history significant for chronic history of LBP with hardware in right femur as a contributing factor.  Previously his pain was at a manageable level and he was able to walk without an assistive device medium distances and participate in water walking 3x/week.  He presents today with a SPC, recently transitioning himself from a 4 wheeled walker.  His is limited to walking 5 minutes at a time with his cane.  His pain is rather severe at times mostly in his back and and right lower leg especially with standing and walking.  Upon lying supine and sidelying he grimaces in response to pain but reports this is close to his baseline level.  He also has a moment of dizziness upon turning to his right side.  He has difficulty activating transverse abdominus muscles and bridging and LE movements are very painful.  He is unable to abduct right hip secondary to weakness and pain.  Right LE weakness at the hip, knee and ankle.  Positive right SLR but negative slump test.  He would benefit from PT to address these deficits.      History and Personal Factors relevant to plan of care:  numerous co-morbidities including chronic LBP, bone cancer resulting in right femur hardware internal fixation;  HTN; asthma    Clinical Presentation  Evolving    Clinical Presentation due to:  symptoms changing over time; multiple regions and body systems affected    Clinical Decision Making  Moderate    Rehab Potential  Good    Clinical  Impairments Affecting Rehab Potential  remote history of bone cancer leading to hardware placement in right femur 49 years ago;  lumbar discectomy early Aug; hard of hearing    PT Frequency  2x / week    PT Duration  8 weeks    PT Treatment/Interventions  ADLs/Self Care Home Management;Cryotherapy;Electrical Stimulation;Moist Heat;Traction;Therapeutic exercise;Therapeutic activities;Gait training;Stair training;Patient/family education;Taping;Dry needling;Neuromuscular re-education    PT Next Visit Plan  do TUG test;  Nu-Step;  seated core stabilization (more comfortable vs. supine);  seated neural flossing/gliding;  try IFC to see if home TENS might be helpful for pain control     PT Home Exercise Plan   Access Code: [email or web address]     Consulted and Agree with Plan of Care  Patient       Patient will benefit from skilled therapeutic intervention in order to improve the following deficits and impairments:  Pain, Decreased strength, Impaired perceived functional ability, Difficulty walking, Increased edema, Decreased activity tolerance, Decreased range of motion  Visit Diagnosis: Acute right-sided low back pain with right-sided sciatica - Plan: PT plan of care cert/re-cert  Muscle weakness (generalized) - Plan: PT plan of care cert/re-cert  Difficulty in walking, not elsewhere classified - Plan: PT plan of care cert/re-cert     Problem List Patient Active Problem List   Diagnosis Date Noted  . Hyperlipidemia 11/11/2017  . Community acquired pneumonia of  right upper lobe of lung ([place]) 11/05/2017  . Right ureteral stone 11/05/2017  . History of total right hip replacement 03/08/2017  . History of prostate cancer 03/08/2017  . 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 High risk medication use 02/28/2017  . Spondylosis of lumbar region without myelopathy or radiculopathy 02/28/2017  . DDD (degenerative disc disease), thoracic 02/28/2017  . Ataxia 05/18/2016  . Vestibular disequilibrium 05/18/2016   . Ankylosing spondylitis ([place]) 05/24/2014  . Lung nodule, solitary 05/24/2014  . Peripheral edema 06/08/2012  . EXTRINSIC ASTHMA, UNSPECIFIED 04/07/2009  . LOW BACK PAIN 04/21/2008  . History of peptic ulcer disease 04/21/2008  . Essential hypertension 10/29/2007   Ruben Im, PT 09/15/18 9:56 PM Phone: [phone] Fax: [phone] Alvera Singh 09/15/2018, 9:56 PM  [facility] [address], [place], [place], [address] Phone: [phone]   Fax:  [phone]  Name: Bruce Ball MRN: [id number] Date of Birth: 04-01-1946

## 2018-09-15 NOTE — Patient Instructions (Addendum)
   [facility] [address] [place], [place] [address] Phone # [phone] Fax [phone]

## 2018-09-23 ENCOUNTER — Encounter: Payer: Self-pay | Admitting: Physical Therapy

## 2018-09-23 ENCOUNTER — Ambulatory Visit: Payer: Medicare Other | Admitting: Physical Therapy

## 2018-09-23 DIAGNOSIS — M6281 Muscle weakness (generalized): Secondary | ICD-10-CM

## 2018-09-23 DIAGNOSIS — R262 Difficulty in walking, not elsewhere classified: Secondary | ICD-10-CM

## 2018-09-23 DIAGNOSIS — M5441 Lumbago with sciatica, right side: Secondary | ICD-10-CM

## 2018-09-23 NOTE — Therapy (Signed)
[facility] [address] [place], [place], [address] Phone: [phone]   Fax:  [phone]  Physical Therapy Treatment  Patient Details  Name: Bruce Ball MRN: [id number] Date of Birth: 1946-05-14 Referring Provider: Dr. Sherwood Gambler    Encounter Date: 09/23/2018  PT End of Session - 09/23/18 1307    Visit Number  2    Date for PT Re-Evaluation  11/10/18    Authorization Type  Medicare KX at visit 15    PT Start Time  1225    PT Stop Time  1310    PT Time Calculation (min)  45 min    Activity Tolerance  Patient limited by pain   limited in position tolerance for lying supine and standing      Past Medical History:  Diagnosis Date  . Chronic back pain greater than 3 months duration   . Chronic leg pain   . Extrinsic asthma, unspecified 04/07/2009  . History of kidney stones   . HYPERTENSION 10/29/2007  . HYPOTENSION 08/13/2010  . LOW BACK PAIN 04/21/2008  . NEPHROLITHIASIS, HX OF 04/21/2008  . PUD, HX OF 04/21/2008    Past Surgical History:  Procedure Laterality Date  . bone cancer     rt femur removed and steel rod placed  . CYSTOSCOPY WITH RETROGRADE PYELOGRAM, URETEROSCOPY AND STENT PLACEMENT Right 11/06/2017   Procedure: CYSTOSCOPY WITH  STENT PLACEMENT RIGHT;  Surgeon: Raynelle Bring, MD;  Location: [facility];  Service: Urology;  Laterality: Right;  . CYSTOSCOPY/RETROGRADE/URETEROSCOPY     1 year ago at [place] in [place]  . CYSTOSCOPY/URETEROSCOPY/HOLMIUM LASER Right 11/24/2017   Procedure: CYSTOSCOPY/URETEROSCOPY/ RETROGRADE/STENT REMOVAL;  Surgeon: Raynelle Bring, MD;  Location: [facility];  Service: Urology;  Laterality: Right;  . prosatectomy      There were no vitals filed for this visit.  Subjective Assessment - 09/23/18 1224    Subjective  Pt reports LBP today, rating about a 5.  Arrives with [facility].    Pertinent History  Hardware right femur 49 years;  back pain for 49 years with chronic pain but able  to walk without assistive device;  very hard of hearing ;  lumbar discectomy early Aug    Limitations  Walking;House hold activities    How long can you sit comfortably?  as long as I want now but initially difficult    How long can you walk comfortably?  10-15 min with walker;  5 min with cane; 2 min without assistive device ;  scooter for [employer]    Diagnostic tests  MRI extruded disc compressing nerve root    Patient Stated Goals  get rid of residual right lower leg pain;  be able to walk without device;  travel     Currently in Pain?  Yes    Pain Score  5     Pain Location  Back    Pain Orientation  Other (Comment)   bilateral   Pain Descriptors / Indicators  Aching                       [facility] Adult PT Treatment/Exercise - 09/23/18 0001      Exercises   Exercises  Lumbar;Knee/Hip      Lumbar Exercises: Stretches   Other Lumbar Stretch Exercise  sciatic nerve flossing neck flexion knee extension x 10 bil      Lumbar Exercises: Seated   Other Seated Lumbar Exercises  seated body blade Rt/Lt sup/inf/med/lat bil  UEs x 30 sec each    Other Seated Lumbar Exercises  ab set 10x 10 sec, red tband pulls fwd/bwd 10 x 5 sec      Knee/Hip Exercises: Seated   Abduction/Adduction   Strengthening    Abd/Adduction Limitations  blue tband x 20   Pt reported increased Lt hip pain     Modalities   Modalities  Electrical Stimulation      Electrical Stimulation   Electrical Stimulation Location  lumbar    Electrical Stimulation Parameters  IFC    Electrical Stimulation Goals  Pain             PT Education - 09/23/18 1309    Education Details  use of abdominals during position transitions to avoid pain, incorporation of core awareness in all postures    Person(s) Educated  Patient    Methods  Explanation;Verbal cues;Demonstration    Comprehension  Verbalized understanding;Returned demonstration       PT Short Term Goals - 09/15/18 2144      PT SHORT TERM GOAL #1    Title  The patient will demonstrate knowledge of initial HEP to improve initial mobility and function     Time  4    Period  Weeks    Status  New    Target Date  10/13/18      PT SHORT TERM GOAL #2   Title  The patient will report a 25% improvement in right LE symptoms with usual ADLs    Time  4    Period  Weeks    Status  New      PT SHORT TERM GOAL #3   Title  The patient will be able to ambulate 10 minutes with cane for short to medium community distances     Time  4    Period  Weeks    Status  New      PT SHORT TERM GOAL #4   Title  The patient will have improved gait speed with Timed up and Go to < 14 sec     Time  4    Period  Weeks    Status  New        PT Long Term Goals - 09/15/18 2148      PT LONG TERM GOAL #1   Title  The patient will be independent with safe self progression of HEP and return to water walking program     Time  8    Period  Weeks    Status  New    Target Date  11/10/18      PT LONG TERM GOAL #2   Title  The patient will report a 50% reduction in LE pain with usual ADLs    Time  8    Period  Weeks    Status  New      PT LONG TERM GOAL #3   Title  The patient will have improved right hip, knee and ankle strength to grossly 3+/5 to 4-/5 needed for improved tolerance of standing and walking    Time  8    Period  Weeks    Status  New      PT LONG TERM GOAL #4   Title  The patient will have improved core strength to ambulate household distances without assistive device    Time  8    Period  Weeks      PT LONG TERM GOAL #5   Title  The  patient will be able to walk 15-20 minutes with cane for medium community distances    Time  8    Period  Weeks    Status  New      Additional Long Term Goals   Additional Long Term Goals  Yes      PT LONG TERM GOAL #6   Title  FOTO functional outcome score improved from 58% limitation to 43%.      Time  8    Period  Weeks    Status  New            Plan - 09/23/18 1306    Clinical  Impression Statement  Pt performed initial core stabilization program in sitting today as he is limited by pain in supine and standing.  Pt needed repeated verbal cueing to maintain transversus abdominus contraction throughout exercises.  Pt reported increased Rt hip pain (hardware x 49 years due to bone cancer) with seated hip abduction so PT d/c'd exercise.  PT performed a trial of IFC on lumbar spine to get Pt feedback on pain with use of e-stim.  Pt will continue to benefit from skilled progression of core stabilization and functional mobility to address his pain and other deficits.    Rehab Potential  Good    Clinical Impairments Affecting Rehab Potential  remote history of bone cancer leading to hardware placement in right femur 49 years ago;  lumbar discectomy early Aug; hard of hearing    PT Frequency  2x / week    PT Duration  8 weeks    PT Treatment/Interventions  ADLs/Self Care Home Management;Cryotherapy;Electrical Stimulation;Moist Heat;Traction;Therapeutic exercise;Therapeutic activities;Gait training;Stair training;Patient/family education;Taping;Dry needling;Neuromuscular re-education    PT Next Visit Plan  do TUG test;  seated core stabilization (more comfortable vs. supine);  seated neural flossing/gliding;  f/u on IFC from last visit    PT Home Exercise Plan   Access Code: [email or web address]     Consulted and Agree with Plan of Care  Patient       Patient will benefit from skilled therapeutic intervention in order to improve the following deficits and impairments:  Pain, Decreased strength, Impaired perceived functional ability, Difficulty walking, Increased edema, Decreased activity tolerance, Decreased range of motion  Visit Diagnosis: Acute right-sided low back pain with right-sided sciatica  Muscle weakness (generalized)  Difficulty in walking, not elsewhere classified     Problem List Patient Active Problem List   Diagnosis Date Noted  . Hyperlipidemia 11/11/2017  .  Community acquired pneumonia of right upper lobe of lung ([place]) 11/05/2017  . Right ureteral stone 11/05/2017  . History of total right hip replacement 03/08/2017  . History of prostate cancer 03/08/2017  . 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 High risk medication use 02/28/2017  . Spondylosis of lumbar region without myelopathy or radiculopathy 02/28/2017  . DDD (degenerative disc disease), thoracic 02/28/2017  . Ataxia 05/18/2016  . Vestibular disequilibrium 05/18/2016  . Ankylosing spondylitis ([place]) 05/24/2014  . Lung nodule, solitary 05/24/2014  . Peripheral edema 06/08/2012  . EXTRINSIC ASTHMA, UNSPECIFIED 04/07/2009  . LOW BACK PAIN 04/21/2008  . History of peptic ulcer disease 04/21/2008  . Essential hypertension 10/29/2007    Bruce Ball, PT 09/23/18 1:14 PM     [facility] [address] [place], [place], [address] Phone: [phone]   Fax:  [phone]  Name: Bruce Ball MRN: [id number] Date of Birth: September 10, 1946

## 2018-09-25 ENCOUNTER — Ambulatory Visit: Payer: Medicare Other | Admitting: Physical Therapy

## 2018-09-25 ENCOUNTER — Encounter: Payer: Self-pay | Admitting: Physical Therapy

## 2018-09-25 DIAGNOSIS — R262 Difficulty in walking, not elsewhere classified: Secondary | ICD-10-CM

## 2018-09-25 DIAGNOSIS — M6281 Muscle weakness (generalized): Secondary | ICD-10-CM | POA: Diagnosis not present

## 2018-09-25 DIAGNOSIS — M5441 Lumbago with sciatica, right side: Secondary | ICD-10-CM

## 2018-09-25 NOTE — Therapy (Signed)
[facility] [address] [place], [place], [address] Phone: [phone]   Fax:  [phone]  Physical Therapy Treatment  Patient Details  Name: Bruce Ball MRN: [id number] Date of Birth: 03-11-1946 Referring Provider (PT): Dr. Sherwood Gambler    Encounter Date: 09/25/2018  PT End of Session - 09/25/18 1129    Visit Number  3    Date for PT Re-Evaluation  11/10/18    Authorization Type  Medicare KX at visit 15    PT Start Time  1051    PT Stop Time  1134    PT Time Calculation (min)  43 min    Activity Tolerance  Patient tolerated treatment well       Past Medical History:  Diagnosis Date  . Chronic back pain greater than 3 months duration   . Chronic leg pain   . Extrinsic asthma, unspecified 04/07/2009  . History of kidney stones   . HYPERTENSION 10/29/2007  . HYPOTENSION 08/13/2010  . LOW BACK PAIN 04/21/2008  . NEPHROLITHIASIS, HX OF 04/21/2008  . PUD, HX OF 04/21/2008    Past Surgical History:  Procedure Laterality Date  . bone cancer     rt femur removed and steel rod placed  . CYSTOSCOPY WITH RETROGRADE PYELOGRAM, URETEROSCOPY AND STENT PLACEMENT Right 11/06/2017   Procedure: CYSTOSCOPY WITH  STENT PLACEMENT RIGHT;  Surgeon: Raynelle Bring, MD;  Location: [facility];  Service: Urology;  Laterality: Right;  . CYSTOSCOPY/RETROGRADE/URETEROSCOPY     1 year ago at [place] in [place]  . CYSTOSCOPY/URETEROSCOPY/HOLMIUM LASER Right 11/24/2017   Procedure: CYSTOSCOPY/URETEROSCOPY/ RETROGRADE/STENT REMOVAL;  Surgeon: Raynelle Bring, MD;  Location: [facility];  Service: Urology;  Laterality: Right;  . prosatectomy      There were no vitals filed for this visit.  Subjective Assessment - 09/25/18 1052    Subjective  Presents without SPC today but states he needed to use RW yesterday.  No difference with TENS.   Walking a lot around the house this week more than last week.      Pertinent History  Hardware right femur 49  years;  back pain for 49 years with chronic pain but able to walk without assistive device;  very hard of hearing ;  lumbar discectomy early Aug    Currently in Pain?  Yes    Pain Score  7     Pain Location  Back    Pain Type  Chronic pain    Aggravating Factors   standing; walking; weightbearing on right         [facility] PT Assessment - 09/25/18 0001      Timed Up and Go Test   TUG Comments  13.99                   [facility] Adult PT Treatment/Exercise - 09/25/18 0001      Lumbar Exercises: Aerobic   Nustep  L1 10 min    therapist closely monitoring response     Lumbar Exercises: Seated   Sit to Stand  5 reps   from very high table but discontinued b/c of pain increase   Sit to Stand Limitations  seated trunk isometric extension into big red ball against wall 10x 5 sec holds     Other Seated Lumbar Exercises  foam roll push down 10x 5 sec holds    Other Seated Lumbar Exercises  3# hip to hip; shoulder to hip; shoulder/knees/shoulder; ear to ear 10x each  Shoulder Exercises: Seated   Extension  Strengthening;Both;15 reps;Theraband    Theraband Level (Shoulder Extension)  Level 2 (Red)    Row  Strengthening;Both;15 reps;Theraband;Other (comment)    Horizontal ABduction  Strengthening;20 reps;Theraband    Theraband Level (Shoulder Horizontal ABduction)  Level 3 (Green)    Other Seated Exercises  seated hand to opposite knee isometric 10x 5 sec holds alternating                PT Short Term Goals - 09/25/18 1133      PT SHORT TERM GOAL #1   Title  The patient will demonstrate knowledge of initial HEP to improve initial mobility and function     Period  Weeks    Status  On-going      PT SHORT TERM GOAL #2   Title  The patient will report a 25% improvement in right LE symptoms with usual ADLs    Time  4    Period  Weeks    Status  On-going      PT SHORT TERM GOAL #3   Title  The patient will be able to ambulate 10 minutes with cane for short to medium  community distances     Time  4    Period  Weeks    Status  On-going        PT Long Term Goals - 09/15/18 2148      PT LONG TERM GOAL #1   Title  The patient will be independent with safe self progression of HEP and return to water walking program     Time  8    Period  Weeks    Status  New    Target Date  11/10/18      PT LONG TERM GOAL #2   Title  The patient will report a 50% reduction in LE pain with usual ADLs    Time  8    Period  Weeks    Status  New      PT LONG TERM GOAL #3   Title  The patient will have improved right hip, knee and ankle strength to grossly 3+/5 to 4-/5 needed for improved tolerance of standing and walking    Time  8    Period  Weeks    Status  New      PT LONG TERM GOAL #4   Title  The patient will have improved core strength to ambulate household distances without assistive device    Time  8    Period  Weeks      PT LONG TERM GOAL #5   Title  The patient will be able to walk 15-20 minutes with cane for medium community distances    Time  8    Period  Weeks    Status  New      Additional Long Term Goals   Additional Long Term Goals  Yes      PT LONG TERM GOAL #6   Title  FOTO functional outcome score improved from 58% limitation to 43%.      Time  8    Period  Weeks    Status  New            Plan - 09/25/18 1130    Clinical Impression Statement  The patient is able to perform seated core strengthening (mild to moderate intensity) without change in pain level.  The only exercise which produced increased pain was sit to stand from a  high table so this was discontinued.  Avoided hip abduction as this was painful on the last visit.  He also did well on the Nu-Step without pain exacerbation.  Therapist closely monitoring pain behaviors and modifying as needed.  Verbal cues to avoid holding his breath.  The patient declines the need for modalities and reports no change in his pain from ES last visit.      Rehab Potential  Good     Clinical Impairments Affecting Rehab Potential  remote history of bone cancer leading to hardware placement in right femur 49 years ago;  lumbar discectomy early Aug; hard of hearing    PT Frequency  2x / week    PT Duration  8 weeks    PT Treatment/Interventions  ADLs/Self Care Home Management;Cryotherapy;Electrical Stimulation;Moist Heat;Traction;Therapeutic exercise;Therapeutic activities;Gait training;Stair training;Patient/family education;Taping;Dry needling;Neuromuscular re-education    PT Next Visit Plan  seated core stabilization (more comfortable vs. supine);  seated neural flossing/gliding; continue Nu-Step;  avoid right hip abduction and excessive sit to stand    PT Home Exercise Plan   Access Code: [email or web address]        Patient will benefit from skilled therapeutic intervention in order to improve the following deficits and impairments:  Pain, Decreased strength, Impaired perceived functional ability, Difficulty walking, Increased edema, Decreased activity tolerance, Decreased range of motion  Visit Diagnosis: Acute right-sided low back pain with right-sided sciatica  Muscle weakness (generalized)  Difficulty in walking, not elsewhere classified     Problem List Patient Active Problem List   Diagnosis Date Noted  . Hyperlipidemia 11/11/2017  . Community acquired pneumonia of right upper lobe of lung ([place]) 11/05/2017  . Right ureteral stone 11/05/2017  . History of total right hip replacement 03/08/2017  . History of prostate cancer 03/08/2017  . 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 High risk medication use 02/28/2017  . Spondylosis of lumbar region without myelopathy or radiculopathy 02/28/2017  . DDD (degenerative disc disease), thoracic 02/28/2017  . Ataxia 05/18/2016  . Vestibular disequilibrium 05/18/2016  . Ankylosing spondylitis ([place]) 05/24/2014  . Lung nodule, solitary 05/24/2014  . Peripheral edema 06/08/2012  . EXTRINSIC ASTHMA, UNSPECIFIED 04/07/2009  . LOW BACK  PAIN 04/21/2008  . History of peptic ulcer disease 04/21/2008  . Essential hypertension 10/29/2007   Ruben Im, PT 09/25/18 11:45 AM Phone: [phone] Fax: [phone]  Alvera Singh 09/25/2018, 11:44 AM  [facility] [address], [place], [place], [address] Phone: [phone]   Fax:  [phone]  Name: Bruce Ball MRN: [id number] Date of Birth: August 08, 1946

## 2018-09-28 ENCOUNTER — Ambulatory Visit: Payer: Medicare Other | Admitting: Physical Therapy

## 2018-09-28 ENCOUNTER — Encounter: Payer: Self-pay | Admitting: Physical Therapy

## 2018-09-28 DIAGNOSIS — M5441 Lumbago with sciatica, right side: Secondary | ICD-10-CM | POA: Diagnosis not present

## 2018-09-28 DIAGNOSIS — M6281 Muscle weakness (generalized): Secondary | ICD-10-CM

## 2018-09-28 DIAGNOSIS — R262 Difficulty in walking, not elsewhere classified: Secondary | ICD-10-CM | POA: Diagnosis not present

## 2018-09-28 NOTE — Therapy (Signed)
[facility] [address] [place], [place], [address] Phone: [phone]   Fax:  [phone]  Physical Therapy Treatment  Patient Details  Name: Bruce Ball MRN: [id number] Date of Birth: [birth year] Referring Provider (PT): Dr. Sherwood Gambler    Encounter Date: 09/28/2018  PT End of Session - 09/28/18 1441    Visit Number  4    Date for PT Re-Evaluation  11/10/18    Authorization Type  Medicare KX at visit 15    PT Start Time  0200    PT Stop Time  0240    PT Time Calculation (min)  40 min    Activity Tolerance  Patient tolerated treatment well       Past Medical History:  Diagnosis Date  . Chronic back pain greater than 3 months duration   . Chronic leg pain   . Extrinsic asthma, unspecified 04/07/2009  . History of kidney stones   . HYPERTENSION 10/29/2007  . HYPOTENSION 08/13/2010  . LOW BACK PAIN 04/21/2008  . NEPHROLITHIASIS, HX OF 04/21/2008  . PUD, HX OF 04/21/2008    Past Surgical History:  Procedure Laterality Date  . bone cancer     rt femur removed and steel rod placed  . CYSTOSCOPY WITH RETROGRADE PYELOGRAM, URETEROSCOPY AND STENT PLACEMENT Right 11/06/2017   Procedure: CYSTOSCOPY WITH  STENT PLACEMENT RIGHT;  Surgeon: Raynelle Bring, MD;  Location: [facility];  Service: Urology;  Laterality: Right;  . CYSTOSCOPY/RETROGRADE/URETEROSCOPY     1 year ago at [place] in [place]  . CYSTOSCOPY/URETEROSCOPY/HOLMIUM LASER Right 11/24/2017   Procedure: CYSTOSCOPY/URETEROSCOPY/ RETROGRADE/STENT REMOVAL;  Surgeon: Raynelle Bring, MD;  Location: [facility];  Service: Urology;  Laterality: Right;  . prosatectomy      There were no vitals filed for this visit.  Subjective Assessment - 09/28/18 1402    Subjective  Pt reports he gets more pain the day after exercising at therapy in his back and hip but he states he feels like that is just part of the process to get stronger.  Pt presents without cane today.  Pt states he  [facility]    Pertinent History  Hardware right femur 49 years;  back pain for 49 years with chronic pain but able to walk without assistive device;  very hard of hearing ;  lumbar discectomy early Aug    Limitations  Walking;House hold activities    How long can you sit comfortably?  as long as I want now but initially difficult    How long can you walk comfortably?  10-15 min with walker;  5 min with cane; 2 min without assistive device ;  scooter for [employer]    Diagnostic tests  MRI extruded disc compressing nerve root    Patient Stated Goals  get rid of residual right lower leg pain;  be able to walk without device;  travel     Currently in Pain?  Yes    Pain Score  5     Pain Location  Back    Pain Descriptors / Indicators  Aching    Pain Type  Chronic pain    Pain Frequency  Intermittent                       [facility] Adult PT Treatment/Exercise - 09/28/18 0001      Exercises   Exercises  Lumbar;Shoulder;Knee/Hip  Lumbar Exercises: Aerobic   Nustep  L1 x 10 min   PT present to monitor symptoms     Lumbar Exercises: Seated   Other Seated Lumbar Exercises  physioball squeezes and presses bil UEs, seated extension isometric into ball   all 10x10 sec holds with PT cueing for ab set   Other Seated Lumbar Exercises  3# shoulder to shoulder, hip to hip, shoulder to hip bil      Shoulder Exercises: Seated   Row  Strengthening;Both;15 reps;Theraband;Other (comment)   seated on balance disc with PT cueing ab set   Horizontal ABduction  Strengthening;20 reps;Theraband    Theraband Level (Shoulder Horizontal ABduction)  Level 3 (Green)   seated on balance disc with PT cueing ab set   Other Seated Exercises  seated anti-rotation isometrics green tband bil 3x20 sec               PT Short Term Goals - 09/25/18 1133      PT SHORT TERM GOAL #1   Title  The patient will  demonstrate knowledge of initial HEP to improve initial mobility and function     Period  Weeks    Status  On-going      PT SHORT TERM GOAL #2   Title  The patient will report a 25% improvement in right LE symptoms with usual ADLs    Time  4    Period  Weeks    Status  On-going      PT SHORT TERM GOAL #3   Title  The patient will be able to ambulate 10 minutes with cane for short to medium community distances     Time  4    Period  Weeks    Status  On-going        PT Long Term Goals - 09/15/18 2148      PT LONG TERM GOAL #1   Title  The patient will be independent with safe self progression of HEP and return to water walking program     Time  8    Period  Weeks    Status  New    Target Date  11/10/18      PT LONG TERM GOAL #2   Title  The patient will report a 50% reduction in LE pain with usual ADLs    Time  8    Period  Weeks    Status  New      PT LONG TERM GOAL #3   Title  The patient will have improved right hip, knee and ankle strength to grossly 3+/5 to 4-/5 needed for improved tolerance of standing and walking    Time  8    Period  Weeks    Status  New      PT LONG TERM GOAL #4   Title  The patient will have improved core strength to ambulate household distances without assistive device    Time  8    Period  Weeks      PT LONG TERM GOAL #5   Title  The patient will be able to walk 15-20 minutes with cane for medium community distances    Time  8    Period  Weeks    Status  New      Additional Long Term Goals   Additional Long Term Goals  Yes      PT LONG TERM GOAL #6   Title  FOTO functional outcome score improved from 58% limitation  to 43%.      Time  8    Period  Weeks    Status  New            Plan - 09/28/18 1441    Clinical Impression Statement  Pt reports he gets delayed onset of increased back and hip pain the day after PT exercises.  He requires ongoing verbal cueing to engage his abdominals and was challenged with multi-directional  vectors through UEs for trunk strengthening today.  Pt reported some bil shoulder fatigue with repeated exercises over 90 degrees of shoulder flexion.  PT discussed with Pt that when he can start tolerating exercises with less pain after appointments that PT will build more of a HEP for him to continue to supplement progress in PT.  Pt will continue to benefit from skilled progression of trunk and postural stabilization as tolerated.    Clinical Impairments Affecting Rehab Potential  remote history of bone cancer leading to hardware placement in right femur 49 years ago;  lumbar discectomy early Aug; hard of hearing    PT Frequency  2x / week    PT Duration  8 weeks    PT Treatment/Interventions  ADLs/Self Care Home Management;Cryotherapy;Electrical Stimulation;Moist Heat;Traction;Therapeutic exercise;Therapeutic activities;Gait training;Stair training;Patient/family education;Taping;Dry needling;Neuromuscular re-education    PT Next Visit Plan  seated core stabilization (more comfortable vs. supine);  seated neural flossing/gliding; continue Nu-Step;  avoid right hip abduction and excessive sit to stand    PT Home Exercise Plan   Access Code: [email or web address]        Patient will benefit from skilled therapeutic intervention in order to improve the following deficits and impairments:  Pain, Decreased strength, Impaired perceived functional ability, Difficulty walking, Increased edema, Decreased activity tolerance, Decreased range of motion  Visit Diagnosis: Muscle weakness (generalized)  Acute right-sided low back pain with right-sided sciatica  Difficulty in walking, not elsewhere classified     Problem List Patient Active Problem List   Diagnosis Date Noted  . Hyperlipidemia 11/11/2017  . Community acquired pneumonia of right upper lobe of lung ([place]) 11/05/2017  . Right ureteral stone 11/05/2017  . History of total right hip replacement 03/08/2017  . History of prostate cancer 03/08/2017  .  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 High risk medication use 02/28/2017  . Spondylosis of lumbar region without myelopathy or radiculopathy 02/28/2017  . DDD (degenerative disc disease), thoracic 02/28/2017  . Ataxia 05/18/2016  . Vestibular disequilibrium 05/18/2016  . Ankylosing spondylitis ([place]) 05/24/2014  . Lung nodule, solitary 05/24/2014  . Peripheral edema 06/08/2012  . EXTRINSIC ASTHMA, UNSPECIFIED 04/07/2009  . LOW BACK PAIN 04/21/2008  . History of peptic ulcer disease 04/21/2008  . Essential hypertension 10/29/2007   Baruch Merl, PT 09/28/18 2:43 PM    [facility] [address], [place], [place], [address] Phone: [phone]   Fax:  [phone]  Name: Bruce Ball MRN: [id number] Date of Birth: [birth year]

## 2018-09-30 ENCOUNTER — Ambulatory Visit: Payer: Medicare Other | Attending: Neurosurgery | Admitting: Physical Therapy

## 2018-09-30 ENCOUNTER — Encounter: Payer: Self-pay | Admitting: Physical Therapy

## 2018-09-30 DIAGNOSIS — M5441 Lumbago with sciatica, right side: Secondary | ICD-10-CM | POA: Diagnosis not present

## 2018-09-30 DIAGNOSIS — M6281 Muscle weakness (generalized): Secondary | ICD-10-CM | POA: Diagnosis not present

## 2018-09-30 DIAGNOSIS — R262 Difficulty in walking, not elsewhere classified: Secondary | ICD-10-CM

## 2018-09-30 NOTE — Therapy (Signed)
[facility] [address], [place], [place], [address] Phone: [phone]   Fax:  [phone]  Physical Therapy Treatment  Patient Details  Name: Bruce Ball MRN: [id number] Date of Birth: 28-May-1946 Referring Provider (PT): Dr. Sherwood Gambler    Encounter Date: 09/30/2018  PT End of Session - 09/30/18 1149    Visit Number  5    Date for PT Re-Evaluation  11/10/18    Authorization Type  Medicare KX at visit 15    PT Start Time  1146    PT Stop Time  1227    PT Time Calculation (min)  41 min    Activity Tolerance  Patient tolerated treatment well;Patient limited by pain       Past Medical History:  Diagnosis Date  . Chronic back pain greater than 3 months duration   . Chronic leg pain   . Extrinsic asthma, unspecified 04/07/2009  . History of kidney stones   . HYPERTENSION 10/29/2007  . HYPOTENSION 08/13/2010  . LOW BACK PAIN 04/21/2008  . NEPHROLITHIASIS, HX OF 04/21/2008  . PUD, HX OF 04/21/2008    Past Surgical History:  Procedure Laterality Date  . bone cancer     rt femur removed and steel rod placed  . CYSTOSCOPY WITH RETROGRADE PYELOGRAM, URETEROSCOPY AND STENT PLACEMENT Right 11/06/2017   Procedure: CYSTOSCOPY WITH  STENT PLACEMENT RIGHT;  Surgeon: Raynelle Bring, MD;  Location: [facility];  Service: Urology;  Laterality: Right;  . CYSTOSCOPY/RETROGRADE/URETEROSCOPY     1 year ago at [place] in [place]  . CYSTOSCOPY/URETEROSCOPY/HOLMIUM LASER Right 11/24/2017   Procedure: CYSTOSCOPY/URETEROSCOPY/ RETROGRADE/STENT REMOVAL;  Surgeon: Raynelle Bring, MD;  Location: [facility];  Service: Urology;  Laterality: Right;  . prosatectomy      There were no vitals filed for this visit.  Subjective Assessment - 09/30/18 1149    Subjective  Pt reports increased Rt hip and back pain today.  He notes that any of the exercises we do in PT that involve his hip increase its pain which is his typical pattern.  He feels the trunk  exercises are going well.    Pertinent History  Hardware right femur 49 years;  back pain for 49 years with chronic pain but able to walk without assistive device;  very hard of hearing ;  lumbar discectomy early Aug    Limitations  Walking;House hold activities    How long can you sit comfortably?  as long as I want now but initially difficult    Patient Stated Goals  get rid of residual right lower leg pain;  be able to walk without device;  travel     Currently in Pain?  Yes    Pain Score  7     Pain Location  Back    Pain Orientation  Right;Left    Pain Descriptors / Indicators  Aching    Pain Type  Chronic pain    Pain Onset  More than a month ago    Pain Frequency  Constant    Multiple Pain Sites  Yes   Rt hip 7/10 sharp pain        [facility] PT Assessment - 09/30/18 0001      Observation/Other Assessments   Observations  Pt slow with sit to stand transitions and gait due to increased Rt hip and back pain. Antalgic gait with short stance on Rt LE and Rt trunk SB over hip to avoid use of hip abductors in closed chain  [place] Adult PT Treatment/Exercise - 09/30/18 0001      Exercises   Exercises  --      Lumbar Exercises: Aerobic   Nustep  L1 x 10 min   PT present to monitor symptoms     Lumbar Exercises: Seated   Other Seated Lumbar Exercises  physioball squeezes and presses bil UEs, seated extension isometric into ball   all 10x10 sec holds with PT cueing for ab set   Other Seated Lumbar Exercises  3# ball diagonals shoulder to hip, hip to hip, shoulder to shoulder x 2 sets each 20 reps   Pt limited by Rt shoulder pain on second set, PT discontinue     Shoulder Exercises: Seated   Extension  Strengthening;10 reps   10 sec hold for ab set, 2 sets   Theraband Level (Shoulder Extension)  Level 2 (Red)    Row  Strengthening;Both;15 reps;Theraband;Other (comment)   seated on balance disc with PT cueing ab set, 2 sets   Other Seated Exercises   seated anti-rotation isometrics green tband bil 3x20 sec               PT Short Term Goals - 09/25/18 1133      PT SHORT TERM GOAL #1   Title  The patient will demonstrate knowledge of initial HEP to improve initial mobility and function     Period  Weeks    Status  On-going      PT SHORT TERM GOAL #2   Title  The patient will report a 25% improvement in right LE symptoms with usual ADLs    Time  4    Period  Weeks    Status  On-going      PT SHORT TERM GOAL #3   Title  The patient will be able to ambulate 10 minutes with cane for short to medium community distances     Time  4    Period  Weeks    Status  On-going        PT Long Term Goals - 09/15/18 2148      PT LONG TERM GOAL #1   Title  The patient will be independent with safe self progression of HEP and return to water walking program     Time  8    Period  Weeks    Status  New    Target Date  11/10/18      PT LONG TERM GOAL #2   Title  The patient will report a 50% reduction in LE pain with usual ADLs    Time  8    Period  Weeks    Status  New      PT LONG TERM GOAL #3   Title  The patient will have improved right hip, knee and ankle strength to grossly 3+/5 to 4-/5 needed for improved tolerance of standing and walking    Time  8    Period  Weeks    Status  New      PT LONG TERM GOAL #4   Title  The patient will have improved core strength to ambulate household distances without assistive device    Time  8    Period  Weeks      PT LONG TERM GOAL #5   Title  The patient will be able to walk 15-20 minutes with cane for medium community distances    Time  8    Period  Weeks    Status  New      Additional Long Term Goals   Additional Long Term Goals  Yes      PT LONG TERM GOAL #6   Title  FOTO functional outcome score improved from 58% limitation to 43%.      Time  8    Period  Weeks    Status  New            Plan - 09/30/18 1226    Clinical Impression Statement  Pt continues to be  limited in positions for strength training due to pain in supine, sidelying and standing.  He continues to need mod verbal cueing to engage core muscles in sitting and is beginning to improve his awareness of these muscles with seated exercises.  PT plans to avoid hip abduct/adduct exercises as they increase Pt's Rt hip pain (hardware x 49 years due to history of bone cancer.)  Pt will continue to benefit from skilled progression of core strength as tolerated to improve pain and activity tolerance.    Rehab Potential  Good    Clinical Impairments Affecting Rehab Potential  remote history of bone cancer leading to hardware placement in right femur 49 years ago;  lumbar discectomy early Aug; hard of hearing    PT Frequency  2x / week    PT Duration  8 weeks    PT Treatment/Interventions  ADLs/Self Care Home Management;Cryotherapy;Electrical Stimulation;Moist Heat;Traction;Therapeutic exercise;Therapeutic activities;Gait training;Stair training;Patient/family education;Taping;Dry needling;Neuromuscular re-education    PT Next Visit Plan  seated core stabilization (more comfortable vs. supine);  seated neural flossing/gliding; continue Nu-Step;  avoid right hip abduction and excessive sit to stand    PT Home Exercise Plan   Access Code: [email or web address]     Consulted and Agree with Plan of Care  Patient       Patient will benefit from skilled therapeutic intervention in order to improve the following deficits and impairments:  Pain, Decreased strength, Impaired perceived functional ability, Difficulty walking, Increased edema, Decreased activity tolerance, Decreased range of motion  Visit Diagnosis: Muscle weakness (generalized)  Acute right-sided low back pain with right-sided sciatica  Difficulty in walking, not elsewhere classified     Problem List Patient Active Problem List   Diagnosis Date Noted  . Hyperlipidemia 11/11/2017  . Community acquired pneumonia of right upper lobe of lung ([place])  11/05/2017  . Right ureteral stone 11/05/2017  . History of total right hip replacement 03/08/2017  . History of prostate cancer 03/08/2017  . 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 High risk medication use 02/28/2017  . Spondylosis of lumbar region without myelopathy or radiculopathy 02/28/2017  . DDD (degenerative disc disease), thoracic 02/28/2017  . Ataxia 05/18/2016  . Vestibular disequilibrium 05/18/2016  . Ankylosing spondylitis ([place]) 05/24/2014  . Lung nodule, solitary 05/24/2014  . Peripheral edema 06/08/2012  . EXTRINSIC ASTHMA, UNSPECIFIED 04/07/2009  . LOW BACK PAIN 04/21/2008  . History of peptic ulcer disease 04/21/2008  . Essential hypertension 10/29/2007   Baruch Merl, PT 09/30/18 12:30 PM     [facility] [address] [place], [place], [address] Phone: [phone]   Fax:  [phone]  Name: LOUIS IVERY MRN: [id number] Date of Birth: 01/14/1946

## 2018-10-05 ENCOUNTER — Encounter: Payer: Self-pay | Admitting: Physical Therapy

## 2018-10-05 ENCOUNTER — Ambulatory Visit: Payer: Medicare Other | Admitting: Physical Therapy

## 2018-10-05 DIAGNOSIS — M6281 Muscle weakness (generalized): Secondary | ICD-10-CM | POA: Diagnosis not present

## 2018-10-05 DIAGNOSIS — M5441 Lumbago with sciatica, right side: Secondary | ICD-10-CM

## 2018-10-05 DIAGNOSIS — R262 Difficulty in walking, not elsewhere classified: Secondary | ICD-10-CM

## 2018-10-05 NOTE — Therapy (Signed)
[facility] [address], [place], [place], [address] Phone: [phone]   Fax:  [phone]  Physical Therapy Treatment  Patient Details  Name: Bruce Ball MRN: [id number] Date of Birth: 06-06-1946 Referring Provider (PT): Dr. Sherwood Gambler    Encounter Date: 10/05/2018  PT End of Session - 10/05/18 1122    Visit Number  6    Date for PT Re-Evaluation  11/10/18    Authorization Type  Medicare KX at visit 15    PT Start Time  1101    PT Stop Time  5374   Pt arrived in 10/10 pain and wished to be discharged, declined modalities for pain   PT Time Calculation (min)  22 min    Activity Tolerance  Patient limited by pain;Other (comment)   Pt expressed wish to be discharged due to increased pain with PT.      Past Medical History:  Diagnosis Date  . Chronic back pain greater than 3 months duration   . Chronic leg pain   . Extrinsic asthma, unspecified 04/07/2009  . History of kidney stones   . HYPERTENSION 10/29/2007  . HYPOTENSION 08/13/2010  . LOW BACK PAIN 04/21/2008  . NEPHROLITHIASIS, HX OF 04/21/2008  . PUD, HX OF 04/21/2008    Past Surgical History:  Procedure Laterality Date  . bone cancer     rt femur removed and steel rod placed  . CYSTOSCOPY WITH RETROGRADE PYELOGRAM, URETEROSCOPY AND STENT PLACEMENT Right 11/06/2017   Procedure: CYSTOSCOPY WITH  STENT PLACEMENT RIGHT;  Surgeon: Raynelle Bring, MD;  Location: [facility];  Service: Urology;  Laterality: Right;  . CYSTOSCOPY/RETROGRADE/URETEROSCOPY     1 year ago at [place] in [place]  . CYSTOSCOPY/URETEROSCOPY/HOLMIUM LASER Right 11/24/2017   Procedure: CYSTOSCOPY/URETEROSCOPY/ RETROGRADE/STENT REMOVAL;  Surgeon: Raynelle Bring, MD;  Location: [facility];  Service: Urology;  Laterality: Right;  . prosatectomy      There were no vitals filed for this visit.  Subjective Assessment - 10/05/18 1103    Subjective  Pt reports he feels that PT is making his Rt hip and  lower back worse.  He presents today with cane and significant antalgic gait.  He states he has gone back to using his walker and does not feel like he can continue with therapy.  He signed up for a water walking class three times a week which starts this Wednesday.      Pertinent History  Hardware right femur 49 years;  back pain for 49 years with chronic pain but able to walk without assistive device;  very hard of hearing ;  lumbar discectomy early Aug    Limitations  Walking;House hold activities    How long can you sit comfortably?  10 min    How long can you stand comfortably?  0 min    How long can you walk comfortably?  returned use of walker to control pain in walking for household ambulation    Diagnostic tests  MRI extruded disc compressing nerve root    Patient Stated Goals  Pt wishes to be discharged from PT due to increased pain.    Currently in Pain?  Yes    Pain Score  10-Worst pain ever    Pain Location  Back   back in sitting, hip in standing   Pain Orientation  Right    Pain Descriptors / Indicators  Constant;Sharp;Jabbing   back pain is constant, hip is sharp and jabbing   Pain Type  Chronic  pain    Pain Onset  More than a month ago    Pain Frequency  Constant    Aggravating Factors   all positions, worse with standing and walking    Pain Relieving Factors  laying on couch, taking pain meds    Effect of Pain on Daily Activities  hasn't done any activities over the weekend due to increased pain         [facility] PT Assessment - 10/05/18 0001      Observation/Other Assessments   Observations  Pt significant antalgic gait on Rt, slow ambulation with grimacing and use of SPC, slow to get out of chair due to pain                             PT Short Term Goals - 10/05/18 1115      PT SHORT TERM GOAL #1   Title  The patient will demonstrate knowledge of initial HEP to improve initial mobility and function     Time  4    Period  Weeks    Status   Not Met   Pt unable to tolerate exercises due to pain     PT SHORT TERM GOAL #2   Title  The patient will report a 25% improvement in right LE symptoms with usual ADLs    Time  4    Status  Not Met   Pt with limited tolerance of PT interventions and constant pain     PT SHORT TERM GOAL #3   Title  The patient will be able to ambulate 10 minutes with cane for short to medium community distances     Time  4    Period  Weeks    Status  Not Met   Pt has had flare up with PT and returned to use of walker over the weekend.     PT SHORT TERM GOAL #4   Title  The patient will have improved gait speed with Timed up and Go to < 14 sec     Time  4    Period  Weeks    Status  Not Met        PT Long Term Goals - 10/05/18 1121      PT LONG TERM GOAL #1   Title  The patient will be independent with safe self progression of HEP and return to water walking program    Pt has registered for water walking program beginning this week.   Time  8    Period  Weeks    Status  Partially Met      PT LONG TERM GOAL #2   Title  The patient will report a 50% reduction in LE pain with usual ADLs    Time  8    Period  Weeks    Status  Not Met      PT LONG TERM GOAL #3   Title  The patient will have improved right hip, knee and ankle strength to grossly 3+/5 to 4-/5 needed for improved tolerance of standing and walking    Time  8    Period  Weeks    Status  Not Met      PT LONG TERM GOAL #4   Title  The patient will have improved core strength to ambulate household distances without assistive device    Time  8    Period  Weeks  Status  Not Met      PT LONG TERM GOAL #5   Title  The patient will be able to walk 15-20 minutes with cane for medium community distances    Time  8    Period  Weeks    Status  Not Met      PT LONG TERM GOAL #6   Title  FOTO functional outcome score improved from 58% limitation to 43%.      Time  8    Period  Weeks    Status  Not Met            Plan -  10/05/18 1124    Clinical Impression Statement  Pt arrived with increased pain, slow transistions out of chair, significantly antalgic gait with short stance phase on Rt.  He expressed that PT is making him worse and he wished to be discharged.  He has been unable to tolerate any position in PT other than seated due to increased pain in all other positions.  He also did not tolerate LE strengthening in sitting due to increased Rt hip pain with attempts.  He also reported no improvement with heat and estim and declined those modalities after our first use early in PT.  PT has therefore been restricted to a focus on seated trunk isometrics and UE resistance training (seated) for postural re-education and core stabilization which have not been tolerated well.  Pt reported 10/10 pain over the weekend, an inability to perform daily tasks and a return to the use of his walker.  He did sign up for a water walking class 3x/week which starts this week.  Pt wished to be discharged from PT to try this class instead.  PT is in agreement that due to his limtied position and activity tolerance in PT, along with his increased pain with attempted seated exercise, that he will benefit from transition to aquatic program for now.  He may benefit from land-based therapy in the future if he is able to tolerate strength and ROM in the pool.  PT encouraged him to follow up with his MD regarding his increased pain.    History and Personal Factors relevant to plan of care:  numerous co-morbidities including chronic LBP, bone cancer resulting in Rt femur hardware internal fixation, HTN, asthma    Clinical Presentation  Evolving    Clinical Decision Making  Moderate    Rehab Potential  Poor    Clinical Impairments Affecting Rehab Potential  remote history of bone cancer leading to hardware placement in right femur 49 years ago;  lumbar discectomy early Aug; hard of hearing    PT Frequency  2x / week    PT Duration  8 weeks    PT Next  Visit Plan  Pt discharged due to increased pain with PT, will do water walking class instead    PT Home Exercise Plan   Access Code: [email or web address]     Consulted and Agree with Plan of Care  Patient       Patient will benefit from skilled therapeutic intervention in order to improve the following deficits and impairments:     Visit Diagnosis: Muscle weakness (generalized)  Acute right-sided low back pain with right-sided sciatica  Difficulty in walking, not elsewhere classified     Problem List Patient Active Problem List   Diagnosis Date Noted  . Hyperlipidemia 11/11/2017  . Community acquired pneumonia of right upper lobe of lung ([place]) 11/05/2017  . Right ureteral stone  11/05/2017  . History of total right hip replacement 03/08/2017  . History of prostate cancer 03/08/2017  . 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 High risk medication use 02/28/2017  . Spondylosis of lumbar region without myelopathy or radiculopathy 02/28/2017  . DDD (degenerative disc disease), thoracic 02/28/2017  . Ataxia 05/18/2016  . Vestibular disequilibrium 05/18/2016  . Ankylosing spondylitis ([place]) 05/24/2014  . Lung nodule, solitary 05/24/2014  . Peripheral edema 06/08/2012  . EXTRINSIC ASTHMA, UNSPECIFIED 04/07/2009  . LOW BACK PAIN 04/21/2008  . History of peptic ulcer disease 04/21/2008  . Essential hypertension 10/29/2007     PHYSICAL THERAPY DISCHARGE SUMMARY  Visits from Start of Care: 6  Current functional level related to goals / functional outcomes: See above   Remaining deficits: Pt with significant deficits due to limited tolerance of PT and increased pain with attempted PT interventions.  See Summary.   Education / Equipment: Teaching laboratory technician, cane Plan:                                                    Patient goals were not met. Patient is being discharged due to the patient's request.  ?????        Baruch Merl, PT 10/05/18 11:38 AM   [facility] [address] [place], [place], [address] Phone: [phone]   Fax:  [phone]  Name: CLEON SIGNORELLI MRN: [id number] Date of Birth: 02-28-1946

## 2018-10-07 ENCOUNTER — Ambulatory Visit: Payer: Medicare Other | Admitting: Physical Therapy

## 2018-10-12 ENCOUNTER — Ambulatory Visit: Payer: Medicare Other | Admitting: Physical Therapy

## 2018-10-14 ENCOUNTER — Ambulatory Visit: Payer: Medicare Other | Admitting: Physical Therapy

## 2018-12-15 ENCOUNTER — Ambulatory Visit (INDEPENDENT_AMBULATORY_CARE_PROVIDER_SITE_OTHER): Payer: Medicare Other

## 2018-12-15 ENCOUNTER — Encounter: Payer: Self-pay | Admitting: Physician Assistant

## 2018-12-15 ENCOUNTER — Ambulatory Visit (INDEPENDENT_AMBULATORY_CARE_PROVIDER_SITE_OTHER): Payer: Medicare Other | Admitting: Physician Assistant

## 2018-12-15 VITALS — BP 120/68 | HR 71 | Temp 99.0°F | Ht 71.0 in | Wt 234.5 lb

## 2018-12-15 DIAGNOSIS — R05 Cough: Secondary | ICD-10-CM | POA: Diagnosis not present

## 2018-12-15 DIAGNOSIS — R6889 Other general symptoms and signs: Secondary | ICD-10-CM

## 2018-12-15 DIAGNOSIS — R509 Fever, unspecified: Secondary | ICD-10-CM | POA: Diagnosis not present

## 2018-12-15 LAB — POC INFLUENZA A&B (BINAX/QUICKVUE)
Influenza A, POC: NEGATIVE
Influenza B, POC: NEGATIVE

## 2018-12-15 MED ORDER — AZITHROMYCIN 250 MG PO TABS: ORAL_TABLET | ORAL | 0 refills | Status: DC

## 2018-12-15 NOTE — Patient Instructions (Signed)
It was great to see you!  Flu test negative!  I will call you with your chest xray results.  Use medication as prescribed: azithromcyin  Push fluids and get plenty of rest. Please return if you are not improving as expected, or if you have high fevers (>101.5) or difficulty swallowing or worsening productive cough.  Call clinic with questions.  I hope you start feeling better soon!

## 2018-12-15 NOTE — Progress Notes (Signed)
Bruce Ball is a 72 y.o. male here for a new problem.  I acted as a Education administrator for [employer], PA-C Anselmo Pickler, LPN  History of Present Illness:   Chief Complaint  Patient presents with  . Cough    Cough  This is a new problem. Episode onset: Started on Friday. The problem has been gradually worsening. The problem occurs constantly. The cough is productive of sputum. Associated symptoms include chills, a fever (100), headaches, nasal congestion, postnasal drip, a sore throat, shortness of breath and wheezing. Pertinent negatives include no ear congestion or ear pain. The symptoms are aggravated by lying down. Risk factors for lung disease include travel ([place] beginning of December). He has tried nothing for the symptoms. His past medical history is significant for bronchitis and pneumonia.   His wife is also being seen today. She was diagnosed with bacterial sinusitis.   Past Medical History:  Diagnosis Date  . Chronic back pain greater than 3 months duration   . Chronic leg pain   . Extrinsic asthma, unspecified 04/07/2009  . History of kidney stones   . HYPERTENSION 10/29/2007  . HYPOTENSION 08/13/2010  . LOW BACK PAIN 04/21/2008  . NEPHROLITHIASIS, HX OF 04/21/2008  . PUD, HX OF 04/21/2008     Social History   Socioeconomic History  . Marital status: Married    Spouse name: Not on file  . Number of children: Not on file  . Years of education: Not on file  . Highest education level: Not on file  Occupational History  . Occupation: Optometrist  Social Needs  . Financial resource strain: Not on file  . Food insecurity:    Worry: Not on file    Inability: Not on file  . Transportation needs:    Medical: Not on file    Non-medical: Not on file  Tobacco Use  . Smoking status: Former Smoker    Last attempt to quit: 12/30/1978    Years since quitting: 39.9  . Smokeless tobacco: Never Used  Substance and Sexual Activity  . Alcohol use: No  . Drug use: No  . Sexual  activity: Not on file  Lifestyle  . Physical activity:    Days per week: Not on file    Minutes per session: Not on file  . Stress: Not on file  Relationships  . Social connections:    Talks on phone: Not on file    Gets together: Not on file    Attends religious service: Not on file    Active member of club or organization: Not on file    Attends meetings of clubs or organizations: Not on file    Relationship status: Not on file  . Intimate partner violence:    Fear of current or ex partner: Not on file    Emotionally abused: Not on file    Physically abused: Not on file    Forced sexual activity: Not on file  Other Topics Concern  . Not on file  Social History Narrative  . Not on file    Past Surgical History:  Procedure Laterality Date  . bone cancer     rt femur removed and steel rod placed  . CYSTOSCOPY WITH RETROGRADE PYELOGRAM, URETEROSCOPY AND STENT PLACEMENT Right 11/06/2017   Procedure: CYSTOSCOPY WITH  STENT PLACEMENT RIGHT;  Surgeon: Raynelle Bring, MD;  Location: [facility];  Service: Urology;  Laterality: Right;  . CYSTOSCOPY/RETROGRADE/URETEROSCOPY     1 year ago at [facility] in [place]  .  CYSTOSCOPY/URETEROSCOPY/HOLMIUM LASER Right 11/24/2017   Procedure: CYSTOSCOPY/URETEROSCOPY/ RETROGRADE/STENT REMOVAL;  Surgeon: Raynelle Bring, MD;  Location: [facility];  Service: Urology;  Laterality: Right;  . prosatectomy      Family History  Problem Relation Age of Onset  . COPD Father   . Cancer Other        prostate    No Known Allergies  Current Medications:   Current Outpatient Medications:  .  amLODipine (NORVASC) 10 MG tablet, Take 10 mg at bedtime by mouth. , Disp: , Rfl:  .  aspirin EC 81 MG tablet, Take 81 mg daily by mouth., Disp: , Rfl:  .  atorvastatin (LIPITOR) 20 MG tablet, Take 20 mg at bedtime by mouth. , Disp: , Rfl:  .  carvedilol (COREG) 25 MG tablet, Take 25 mg 2 (two) times daily by mouth. , Disp: , Rfl:  .  Cholecalciferol (VITAMIN D) 2000 units  CAPS, Take 2,000 Units 2 (two) times daily by mouth. , Disp: , Rfl:  .  isosorbide mononitrate (IMDUR) 30 MG 24 hr tablet, Take 30 mg at bedtime by mouth. , Disp: , Rfl:  .  omeprazole (PRILOSEC) 20 MG capsule, Take 20 mg by mouth at bedtime. , Disp: , Rfl:  .  oxyCODONE-acetaminophen (PERCOCET/ROXICET) 5-325 MG tablet, Take 1 tablet by mouth 3 (three) times daily as needed for severe pain., Disp: 8 tablet, Rfl: 0 .  predniSONE (DELTASONE) 5 MG tablet, Take 1 tablet (5 mg total) by mouth daily with breakfast. Resume once you have completed a taper of prednisone as instructed, Disp: , Rfl:  .  traMADol (ULTRAM) 50 MG tablet, Take 50 mg 2 (two) times daily as needed by mouth for severe pain., Disp: , Rfl:  .  azithromycin (ZITHROMAX) 250 MG tablet, Take two tablets on day 1, then one tablet daily x 4 days, Disp: 6 each, Rfl: 0   Review of Systems:   Review of Systems  Constitutional: Positive for chills and fever (100).  HENT: Positive for postnasal drip and sore throat. Negative for ear pain.   Respiratory: Positive for cough, shortness of breath and wheezing.   Neurological: Positive for headaches.    Vitals:   Vitals:   12/15/18 1406  BP: 120/68  Pulse: 71  Temp: 99 F (37.2 C)  TempSrc: Oral  SpO2: 95%  Weight: 234 lb 8 oz (106.4 kg)  Height: 5\' 11"  (1.803 m)     Body mass index is 32.71 kg/m.  Physical Exam:   Physical Exam Vitals signs and nursing note reviewed.  Constitutional:      General: He is not in acute distress.    Appearance: He is well-developed. He is not ill-appearing or toxic-appearing.  HENT:     Head: Normocephalic and atraumatic.     Right Ear: Tympanic membrane, ear canal and external ear normal. Tympanic membrane is not erythematous, retracted or bulging.     Left Ear: Tympanic membrane, ear canal and external ear normal. Tympanic membrane is not erythematous, retracted or bulging.     Nose: Mucosal edema and congestion present.     Right Sinus:  Maxillary sinus tenderness present. No frontal sinus tenderness.     Left Sinus: Maxillary sinus tenderness present. No frontal sinus tenderness.     Mouth/Throat:     Pharynx: Uvula midline. Posterior oropharyngeal erythema present.     Tonsils: Swelling: 0 on the right. 0 on the left.  Eyes:     General: Lids are normal.  Conjunctiva/sclera: Conjunctivae normal.  Neck:     Trachea: Trachea normal.  Cardiovascular:     Rate and Rhythm: Normal rate and regular rhythm.     Heart sounds: Normal heart sounds, S1 normal and S2 normal.  Pulmonary:     Effort: Pulmonary effort is normal.     Breath sounds: Decreased breath sounds present. No wheezing, rhonchi or rales.  Lymphadenopathy:     Cervical: No cervical adenopathy.  Skin:    General: Skin is warm and dry.  Neurological:     Mental Status: He is alert.  Psychiatric:        Speech: Speech normal.        Behavior: Behavior normal. Behavior is cooperative.     Results for orders placed or performed in visit on 12/15/18  POC Influenza A&B(BINAX/QUICKVUE)  Result Value Ref Range   Influenza A, POC Negative Negative   Influenza B, POC Negative Negative   CXR: no acute abnormalities   Assessment and Plan:   Keontay was seen today for cough.  Diagnoses and all orders for this visit:  Flu-like symptoms -     POC Influenza A&B(BINAX/QUICKVUE) -     DG Chest 2 View; Future  Other orders -     azithromycin (ZITHROMAX) 250 MG tablet; Take two tablets on day 1, then one tablet daily x 4 days   No red flags on exam.  CXR without acute abnormalities. Flu test negative. Possible early bronchitis. Hx of PNA. Will initiate Azithromycin per orders. Discussed taking medications as prescribed. Reviewed return precautions including worsening fever, SOB, worsening cough or other concerns. Push fluids and rest. I recommend that patient follow-up if symptoms worsen or persist despite treatment x 7-10 days, sooner if needed.  . Reviewed  expectations re: course of current medical issues. . Discussed self-management of symptoms. . Outlined signs and symptoms indicating need for more acute intervention. . Patient verbalized understanding and all questions were answered. . See orders for this visit as documented in the electronic medical record. . Patient received an After-Visit Summary.  CMA or LPN served as scribe during this visit. History, Physical, and Plan performed by medical provider. The above documentation has been reviewed and is accurate and complete.  Inda Coke, PA-C

## 2018-12-16 ENCOUNTER — Encounter: Payer: Self-pay | Admitting: Physician Assistant

## 2018-12-30 HISTORY — PX: CYSTOSCOPY/RETROGRADE/URETEROSCOPY: SHX5316

## 2019-01-22 DIAGNOSIS — Z9889 Other specified postprocedural states: Secondary | ICD-10-CM | POA: Diagnosis not present

## 2019-01-22 DIAGNOSIS — Z6833 Body mass index (BMI) 33.0-33.9, adult: Secondary | ICD-10-CM | POA: Diagnosis not present

## 2019-01-22 DIAGNOSIS — I10 Essential (primary) hypertension: Secondary | ICD-10-CM | POA: Diagnosis not present

## 2019-01-22 DIAGNOSIS — M47816 Spondylosis without myelopathy or radiculopathy, lumbar region: Secondary | ICD-10-CM | POA: Diagnosis not present

## 2019-05-30 ENCOUNTER — Emergency Department (HOSPITAL_COMMUNITY)
Admission: EM | Admit: 2019-05-30 | Discharge: 2019-05-30 | Disposition: A | Discharge status: Home / Self Care | Payer: Medicare Other | Attending: Emergency Medicine | Admitting: Emergency Medicine

## 2019-05-30 ENCOUNTER — Encounter (HOSPITAL_COMMUNITY): Payer: Self-pay

## 2019-05-30 ENCOUNTER — Other Ambulatory Visit: Payer: Self-pay

## 2019-05-30 ENCOUNTER — Emergency Department (HOSPITAL_COMMUNITY): Payer: Medicare Other

## 2019-05-30 DIAGNOSIS — Z79899 Other long term (current) drug therapy: Secondary | ICD-10-CM | POA: Diagnosis not present

## 2019-05-30 DIAGNOSIS — Z87891 Personal history of nicotine dependence: Secondary | ICD-10-CM | POA: Diagnosis not present

## 2019-05-30 DIAGNOSIS — N201 Calculus of ureter: Secondary | ICD-10-CM | POA: Diagnosis not present

## 2019-05-30 DIAGNOSIS — R1012 Left upper quadrant pain: Secondary | ICD-10-CM | POA: Diagnosis present

## 2019-05-30 DIAGNOSIS — J45909 Unspecified asthma, uncomplicated: Secondary | ICD-10-CM | POA: Diagnosis not present

## 2019-05-30 DIAGNOSIS — Z7982 Long term (current) use of aspirin: Secondary | ICD-10-CM | POA: Insufficient documentation

## 2019-05-30 DIAGNOSIS — N200 Calculus of kidney: Secondary | ICD-10-CM | POA: Insufficient documentation

## 2019-05-30 LAB — URINALYSIS, ROUTINE W REFLEX MICROSCOPIC
Bacteria, UA: NONE SEEN
Bilirubin Urine: NEGATIVE
Glucose, UA: NEGATIVE mg/dL
Ketones, ur: NEGATIVE mg/dL
Leukocytes,Ua: NEGATIVE
Nitrite: NEGATIVE
Protein, ur: 30 mg/dL — AB
RBC / HPF: >50 RBC/hpf — ABNORMAL HIGH (ref 0–5)
Specific Gravity, Urine: 1.021 (ref 1.005–1.030)
pH: 5 (ref 5.0–8.0)

## 2019-05-30 LAB — CBC WITH DIFFERENTIAL/PLATELET
Abs Immature Granulocytes: 0.04 10*3/uL (ref 0.00–0.07)
Basophils Absolute: 0.1 10*3/uL (ref 0.0–0.1)
Basophils Relative: 1 %
Eosinophils Absolute: 0.1 10*3/uL (ref 0.0–0.5)
Eosinophils Relative: 0 %
HCT: 47.9 % (ref 39.0–52.0)
Hemoglobin: 15.4 g/dL (ref 13.0–17.0)
Immature Granulocytes: 0 %
Lymphocytes Relative: 10 %
Lymphs Abs: 1.3 10*3/uL (ref 0.7–4.0)
MCH: 28.6 pg (ref 26.0–34.0)
MCHC: 32.2 g/dL (ref 30.0–36.0)
MCV: 88.9 fL (ref 80.0–100.0)
Monocytes Absolute: 1 10*3/uL (ref 0.1–1.0)
Monocytes Relative: 8 %
Neutro Abs: 9.9 10*3/uL — ABNORMAL HIGH (ref 1.7–7.7)
Neutrophils Relative %: 81 %
Platelets: 221 10*3/uL (ref 150–400)
RBC: 5.39 MIL/uL (ref 4.22–5.81)
RDW: 13.4 % (ref 11.5–15.5)
WBC: 12.3 10*3/uL — ABNORMAL HIGH (ref 4.0–10.5)
nRBC: 0 % (ref 0.0–0.2)

## 2019-05-30 LAB — BASIC METABOLIC PANEL
Anion gap: 7 (ref 5–15)
BUN: 22 mg/dL (ref 8–23)
CO2: 24 mmol/L (ref 22–32)
Calcium: 8.4 mg/dL — ABNORMAL LOW (ref 8.9–10.3)
Chloride: 110 mmol/L (ref 98–111)
Creatinine, Ser: 1.51 mg/dL — ABNORMAL HIGH (ref 0.61–1.24)
GFR calc Af Amer: 53 mL/min — ABNORMAL LOW (ref >60)
GFR calc non Af Amer: 45 mL/min — ABNORMAL LOW (ref >60)
Glucose, Bld: 120 mg/dL — ABNORMAL HIGH (ref 70–99)
Potassium: 3.8 mmol/L (ref 3.5–5.1)
Sodium: 141 mmol/L (ref 135–145)

## 2019-05-30 MED ORDER — MORPHINE SULFATE (PF) 4 MG/ML IV SOLN
4.0000 mg | Freq: Once | INTRAVENOUS | Status: AC
  Administered 2019-05-30: 4 mg via INTRAVENOUS
  Filled 2019-05-30: qty 1

## 2019-05-30 MED ORDER — ONDANSETRON 4 MG PO TBDP
4.0000 mg | ORAL_TABLET | Freq: Once | ORAL | Status: AC | PRN
  Administered 2019-05-30: 4 mg via ORAL
  Filled 2019-05-30: qty 1

## 2019-05-30 MED ORDER — OXYCODONE-ACETAMINOPHEN 5-325 MG PO TABS: 1.0000 | ORAL_TABLET | Freq: Four times a day (QID) | ORAL | 0 refills | Status: DC | PRN

## 2019-05-30 MED ORDER — KETOROLAC TROMETHAMINE 30 MG/ML IJ SOLN
30.0000 mg | Freq: Once | INTRAMUSCULAR | Status: AC
  Administered 2019-05-30: 30 mg via INTRAVENOUS
  Filled 2019-05-30: qty 1

## 2019-05-30 NOTE — Discharge Instructions (Addendum)
Continue Percocet as previously prescribed.  You may take 2 tablets every 6 hours as needed.  Follow-up with your urologist this week as scheduled, and return to the ER if you develop worsening pain, high fever, or other new and concerning symptoms.

## 2019-05-30 NOTE — ED Triage Notes (Signed)
Patient c/o left flank pain with urinary retention since 0700 today. Patient reports a history of kidney stones.

## 2019-05-30 NOTE — ED Provider Notes (Signed)
[facility] DEPT Provider Note   CSN: [id number] Arrival date & time: 05/30/19  1026    History   Chief Complaint Chief Complaint  Patient presents with  . Flank Pain  . Urinary Retention  . Nausea    HPI BRONSYN SHAPPELL is a 73 y.o. male.     Patient is a 73 year old male with history of renal calculi, hypertension, chronic back pain.  He is followed by a urologist at the [facility] in [place].  He presents today for evaluation of left flank pain and feeling as though he cannot urinate.  This began earlier this morning.  Patient has been experiencing recurrent kidney stones for the past 2 years.  He was seen by his urologist approximately 2 months ago and was told he should be able to pass the multiple stones that he had spontaneously without intervention.  Patient denies fevers or chills.  He denies bowel complaints.  He does describe some blood in his urine.  His pain was unrelieved with medication prescribed by his urologist.  The history is provided by the patient.  Flank Pain  This is a recurrent problem. Episode onset: This morning. The problem occurs constantly. The problem has been rapidly worsening. Pertinent negatives include no chest pain. Nothing aggravates the symptoms. Nothing relieves the symptoms. Treatments tried: Oxycodone. The treatment provided no relief.    Past Medical History:  Diagnosis Date  . Chronic back pain greater than 3 months duration   . Chronic leg pain   . Extrinsic asthma, unspecified 04/07/2009  . History of kidney stones   . HYPERTENSION 10/29/2007  . HYPOTENSION 08/13/2010  . LOW BACK PAIN 04/21/2008  . NEPHROLITHIASIS, HX OF 04/21/2008  . PUD, HX OF 04/21/2008    Patient Active Problem List   Diagnosis Date Noted  . Hyperlipidemia 11/11/2017  . Community acquired pneumonia of right upper lobe of lung ([place]) 11/05/2017  . Right ureteral stone 11/05/2017  . History of total right hip replacement 03/08/2017   . History of prostate cancer 03/08/2017  . 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 High risk medication use 02/28/2017  . Spondylosis of lumbar region without myelopathy or radiculopathy 02/28/2017  . DDD (degenerative disc disease), thoracic 02/28/2017  . Ataxia 05/18/2016  . Vestibular disequilibrium 05/18/2016  . Ankylosing spondylitis ([place]) 05/24/2014  . Lung nodule, solitary 05/24/2014  . Peripheral edema 06/08/2012  . EXTRINSIC ASTHMA, UNSPECIFIED 04/07/2009  . LOW BACK PAIN 04/21/2008  . History of peptic ulcer disease 04/21/2008  . Essential hypertension 10/29/2007    Past Surgical History:  Procedure Laterality Date  . bone cancer     rt femur removed and steel rod placed  . CYSTOSCOPY WITH RETROGRADE PYELOGRAM, URETEROSCOPY AND STENT PLACEMENT Right 11/06/2017   Procedure: CYSTOSCOPY WITH  STENT PLACEMENT RIGHT;  Surgeon: Raynelle Bring, MD;  Location: [facility];  Service: Urology;  Laterality: Right;  . CYSTOSCOPY/RETROGRADE/URETEROSCOPY     1 year ago at [place] [facility] in [place]  . CYSTOSCOPY/URETEROSCOPY/HOLMIUM LASER Right 11/24/2017   Procedure: CYSTOSCOPY/URETEROSCOPY/ RETROGRADE/STENT REMOVAL;  Surgeon: Raynelle Bring, MD;  Location: [facility];  Service: Urology;  Laterality: Right;  . prosatectomy          Home Medications    Prior to Admission medications   Medication Sig Start Date End Date Taking? Authorizing Provider  amLODipine (NORVASC) 10 MG tablet Take 10 mg at bedtime by mouth.     [provider]  aspirin EC 81 MG tablet Take 81 mg daily by  mouth.    [provider]  atorvastatin (LIPITOR) 20 MG tablet Take 20 mg at bedtime by mouth.     [provider]  azithromycin (ZITHROMAX) 250 MG tablet Take two tablets on day 1, then one tablet daily x 4 days 12/15/18   Inda Coke, PA  carvedilol (COREG) 25 MG tablet Take 25 mg 2 (two) times daily by mouth.     [provider]  Cholecalciferol (VITAMIN D) 2000 units CAPS Take 2,000  Units 2 (two) times daily by mouth.     [provider]  isosorbide mononitrate (IMDUR) 30 MG 24 hr tablet Take 30 mg at bedtime by mouth.     [provider]  omeprazole (PRILOSEC) 20 MG capsule Take 20 mg by mouth at bedtime.     [provider]  oxyCODONE-acetaminophen (PERCOCET/ROXICET) 5-325 MG tablet Take 1 tablet by mouth 3 (three) times daily as needed for severe pain. 07/30/18   Julianne Rice, MD  predniSONE (DELTASONE) 5 MG tablet Take 1 tablet (5 mg total) by mouth daily with breakfast. Resume once you have completed a taper of prednisone as instructed 05/19/16   Ghimire, Henreitta Leber, MD  traMADol (ULTRAM) 50 MG tablet Take 50 mg 2 (two) times daily as needed by mouth for severe pain.    [provider]    Family History Family History  Problem Relation Age of Onset  . COPD Father   . Cancer Other        prostate    Social History Social History   Tobacco Use  . Smoking status: Former Smoker    Last attempt to quit: 12/30/1978    Years since quitting: 40.4  . Smokeless tobacco: Never Used  Substance Use Topics  . Alcohol use: No  . Drug use: No     Allergies   Patient has no known allergies.   Review of Systems Review of Systems  Cardiovascular: Negative for chest pain.  Genitourinary: Positive for flank pain.  All other systems reviewed and are negative.    Physical Exam Updated Vital Signs BP (!) 123/97 (BP Location: Left Arm)   Pulse (!) 58   Temp 98.6 F (37 C) (Oral)   Resp 18   Ht 5\' 11"  (1.803 m)   Wt 108.9 kg   SpO2 98%   BMI 33.47 kg/m   Physical Exam Vitals signs and nursing note reviewed.  Constitutional:      General: He is not in acute distress.    Appearance: He is well-developed. He is not diaphoretic.  HENT:     Head: Normocephalic and atraumatic.  Neck:     Musculoskeletal: Normal range of motion and neck supple.  Cardiovascular:     Rate and Rhythm: Normal rate and regular rhythm.     Heart  sounds: No murmur. No friction rub.  Pulmonary:     Effort: Pulmonary effort is normal. No respiratory distress.     Breath sounds: Normal breath sounds. No wheezing or rales.  Abdominal:     General: Bowel sounds are normal. There is no distension.     Palpations: Abdomen is soft.     Tenderness: There is no abdominal tenderness. There is left CVA tenderness. There is no guarding or rebound.  Musculoskeletal: Normal range of motion.  Skin:    General: Skin is warm and dry.  Neurological:     Mental Status: He is alert and oriented to person, place, and time.     Coordination: Coordination  normal.      ED Treatments / Results  Labs (all labs ordered are listed, but only abnormal results are displayed) Labs Reviewed  URINALYSIS, ROUTINE W REFLEX MICROSCOPIC  BASIC METABOLIC PANEL  CBC WITH DIFFERENTIAL/PLATELET    EKG None  Radiology No results found.  Procedures Procedures (including critical care time)  Medications Ordered in ED Medications  ketorolac (TORADOL) 30 MG/ML injection 30 mg (has no administration in time range)  morphine 4 MG/ML injection 4 mg (has no administration in time range)  ondansetron (ZOFRAN-ODT) disintegrating tablet 4 mg (4 mg Oral Given 05/30/19 1055)     Initial Impression / Assessment and Plan / ED Course  I have reviewed the triage vital signs and the nursing notes.  Pertinent labs & imaging results that were available during my care of the patient were reviewed by me and considered in my medical decision making (see chart for details).  Patient presenting with complaints of left flank pain.  He has a history of bilateral renal calculi.  Patient has a partially obstructing calculus in the mid/distal left ureter on CT scan today.  He is feeling better with medications administered here.  Patient will be discharged and is to increase his oxycodone to 2 tablets every 6 hours as needed as he is only been taking 1.  Patient to follow-up with  his urologist in [place] this week.  Final Clinical Impressions(s) / ED Diagnoses   Final diagnoses:  None    ED Discharge Orders    None       Veryl Speak, MD 05/30/19 1355

## 2019-05-31 ENCOUNTER — Other Ambulatory Visit: Payer: Self-pay

## 2019-05-31 ENCOUNTER — Emergency Department (HOSPITAL_COMMUNITY)
Admission: EM | Admit: 2019-05-31 | Discharge: 2019-05-31 | Disposition: A | Discharge status: Home / Self Care | Payer: Medicare Other | Attending: Emergency Medicine | Admitting: Emergency Medicine

## 2019-05-31 ENCOUNTER — Encounter (HOSPITAL_COMMUNITY): Payer: Self-pay

## 2019-05-31 DIAGNOSIS — Z96641 Presence of right artificial hip joint: Secondary | ICD-10-CM | POA: Diagnosis not present

## 2019-05-31 DIAGNOSIS — N23 Unspecified renal colic: Secondary | ICD-10-CM | POA: Diagnosis not present

## 2019-05-31 DIAGNOSIS — Z7982 Long term (current) use of aspirin: Secondary | ICD-10-CM | POA: Diagnosis not present

## 2019-05-31 DIAGNOSIS — I10 Essential (primary) hypertension: Secondary | ICD-10-CM | POA: Insufficient documentation

## 2019-05-31 DIAGNOSIS — R109 Unspecified abdominal pain: Secondary | ICD-10-CM | POA: Diagnosis present

## 2019-05-31 DIAGNOSIS — Z87891 Personal history of nicotine dependence: Secondary | ICD-10-CM | POA: Diagnosis not present

## 2019-05-31 DIAGNOSIS — Z79899 Other long term (current) drug therapy: Secondary | ICD-10-CM | POA: Insufficient documentation

## 2019-05-31 DIAGNOSIS — Z85830 Personal history of malignant neoplasm of bone: Secondary | ICD-10-CM | POA: Insufficient documentation

## 2019-05-31 DIAGNOSIS — R339 Retention of urine, unspecified: Secondary | ICD-10-CM | POA: Insufficient documentation

## 2019-05-31 DIAGNOSIS — R309 Painful micturition, unspecified: Secondary | ICD-10-CM | POA: Diagnosis not present

## 2019-05-31 DIAGNOSIS — R110 Nausea: Secondary | ICD-10-CM | POA: Diagnosis not present

## 2019-05-31 DIAGNOSIS — M549 Dorsalgia, unspecified: Secondary | ICD-10-CM | POA: Diagnosis not present

## 2019-05-31 HISTORY — DX: Disorder of kidney and ureter, unspecified: N28.9

## 2019-05-31 LAB — COMPREHENSIVE METABOLIC PANEL
ALT: 23 U/L (ref 0–44)
AST: 20 U/L (ref 15–41)
Albumin: 4.3 g/dL (ref 3.5–5.0)
Alkaline Phosphatase: 63 U/L (ref 38–126)
Anion gap: 7 (ref 5–15)
BUN: 28 mg/dL — ABNORMAL HIGH (ref 8–23)
CO2: 26 mmol/L (ref 22–32)
Calcium: 8.5 mg/dL — ABNORMAL LOW (ref 8.9–10.3)
Chloride: 108 mmol/L (ref 98–111)
Creatinine, Ser: 1.68 mg/dL — ABNORMAL HIGH (ref 0.61–1.24)
GFR calc Af Amer: 46 mL/min — ABNORMAL LOW (ref >60)
GFR calc non Af Amer: 40 mL/min — ABNORMAL LOW (ref >60)
Glucose, Bld: 115 mg/dL — ABNORMAL HIGH (ref 70–99)
Potassium: 4 mmol/L (ref 3.5–5.1)
Sodium: 141 mmol/L (ref 135–145)
Total Bilirubin: 1.3 mg/dL — ABNORMAL HIGH (ref 0.3–1.2)
Total Protein: 7.4 g/dL (ref 6.5–8.1)

## 2019-05-31 LAB — CBC WITH DIFFERENTIAL/PLATELET
Abs Immature Granulocytes: 0.07 10*3/uL (ref 0.00–0.07)
Basophils Absolute: 0.1 10*3/uL (ref 0.0–0.1)
Basophils Relative: 0 %
Eosinophils Absolute: 0 10*3/uL (ref 0.0–0.5)
Eosinophils Relative: 0 %
HCT: 49.6 % (ref 39.0–52.0)
Hemoglobin: 15.9 g/dL (ref 13.0–17.0)
Immature Granulocytes: 1 %
Lymphocytes Relative: 9 %
Lymphs Abs: 1.1 10*3/uL (ref 0.7–4.0)
MCH: 28.7 pg (ref 26.0–34.0)
MCHC: 32.1 g/dL (ref 30.0–36.0)
MCV: 89.5 fL (ref 80.0–100.0)
Monocytes Absolute: 0.9 10*3/uL (ref 0.1–1.0)
Monocytes Relative: 7 %
Neutro Abs: 10.4 10*3/uL — ABNORMAL HIGH (ref 1.7–7.7)
Neutrophils Relative %: 83 %
Platelets: 224 10*3/uL (ref 150–400)
RBC: 5.54 MIL/uL (ref 4.22–5.81)
RDW: 13.4 % (ref 11.5–15.5)
WBC: 12.6 10*3/uL — ABNORMAL HIGH (ref 4.0–10.5)
nRBC: 0 % (ref 0.0–0.2)

## 2019-05-31 LAB — LIPASE, BLOOD: Lipase: 29 U/L (ref 11–51)

## 2019-05-31 MED ORDER — HYDROMORPHONE HCL 1 MG/ML IJ SOLN
1.0000 mg | Freq: Once | INTRAMUSCULAR | Status: AC
  Administered 2019-05-31: 1 mg via INTRAVENOUS
  Filled 2019-05-31: qty 1

## 2019-05-31 MED ORDER — ONDANSETRON HCL 4 MG/2ML IJ SOLN
4.0000 mg | Freq: Once | INTRAMUSCULAR | Status: AC
  Administered 2019-05-31: 21:00:00 4 mg via INTRAVENOUS
  Filled 2019-05-31: qty 2

## 2019-05-31 MED ORDER — KETOROLAC TROMETHAMINE 30 MG/ML IJ SOLN
15.0000 mg | Freq: Once | INTRAMUSCULAR | Status: AC
  Administered 2019-05-31: 15 mg via INTRAVENOUS
  Filled 2019-05-31: qty 1

## 2019-05-31 NOTE — Discharge Instructions (Addendum)
Continue medications for pain, make sure to call the urologist tomorrow to arrange close follow-up

## 2019-05-31 NOTE — ED Triage Notes (Signed)
Patient c/o left flank pain and nausea. Patient was seen yesterday for the same. Patient has a known kidney stone. Patient states when he urinates, it very little.

## 2019-05-31 NOTE — ED Notes (Signed)
Unable to obtain discharge signature due to someone from registration being in the chart. Patient verbalized permission for RN to sign for discharge. Patient is A&O x4, stable, and ambulatory at discharge. Discharge instructions reviewed and patient given opportunity for questions.

## 2019-05-31 NOTE — ED Notes (Signed)
Bladder scanned for pt showed 74mL. Pt stated last time he urinated was 1400. Pt has urinal at bedside

## 2019-05-31 NOTE — ED Provider Notes (Signed)
[facility] DEPT Provider Note   CSN: [id number] Arrival date & time: 05/31/19  1819    History   Chief Complaint Chief Complaint  Patient presents with  . Flank Pain  . Nausea  . urinary retention    HPI Bruce Ball is a 73 y.o. male.     HPI Patient presents to the emergency room for evaluation of persistent pain associated with left-sided ureteral stone.  Patient has a history of recurrent kidney stones.  He started having pain in his back yesterday.  He also experienced urinary discomfort.  Patient denies any trouble with fevers or chills.  Had some nausea but no vomiting.  No diarrhea or constipation.  Patient had Percocet at home that he started taking yesterday.  His pain became more severe so he presented to the emergency room.  Patient had a CT scan performed.  The scan showed a 6 x 3 mm stone in the mid to distal left ureter.  Patient symptoms improved after treatment in the ED.  He was taking his Percocet at home today when the pain started become more severe.  This evening he was not unable to tolerate it so he came to the ED.  He had not spoken to his urologist today. Past Medical History:  Diagnosis Date  . Chronic back pain greater than 3 months duration   . Chronic leg pain   . Extrinsic asthma, unspecified 04/07/2009  . History of kidney stones   . HYPERTENSION 10/29/2007  . HYPOTENSION 08/13/2010  . LOW BACK PAIN 04/21/2008  . NEPHROLITHIASIS, HX OF 04/21/2008  . PUD, HX OF 04/21/2008  . Renal disorder     Patient Active Problem List   Diagnosis Date Noted  . Hyperlipidemia 11/11/2017  . Community acquired pneumonia of right upper lobe of lung ([place]) 11/05/2017  . Right ureteral stone 11/05/2017  . History of total right hip replacement 03/08/2017  . History of prostate cancer 03/08/2017  . 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 High risk medication use 02/28/2017  . Spondylosis of lumbar region without myelopathy or radiculopathy  02/28/2017  . DDD (degenerative disc disease), thoracic 02/28/2017  . Ataxia 05/18/2016  . Vestibular disequilibrium 05/18/2016  . Ankylosing spondylitis ([place]) 05/24/2014  . Lung nodule, solitary 05/24/2014  . Peripheral edema 06/08/2012  . EXTRINSIC ASTHMA, UNSPECIFIED 04/07/2009  . LOW BACK PAIN 04/21/2008  . History of peptic ulcer disease 04/21/2008  . Essential hypertension 10/29/2007    Past Surgical History:  Procedure Laterality Date  . bone cancer     rt femur removed and steel rod placed  . CYSTOSCOPY WITH RETROGRADE PYELOGRAM, URETEROSCOPY AND STENT PLACEMENT Right 11/06/2017   Procedure: CYSTOSCOPY WITH  STENT PLACEMENT RIGHT;  Surgeon: Raynelle Bring, MD;  Location: [facility];  Service: Urology;  Laterality: Right;  . CYSTOSCOPY/RETROGRADE/URETEROSCOPY     1 year ago at [place] in [place]  . CYSTOSCOPY/URETEROSCOPY/HOLMIUM LASER Right 11/24/2017   Procedure: CYSTOSCOPY/URETEROSCOPY/ RETROGRADE/STENT REMOVAL;  Surgeon: Raynelle Bring, MD;  Location: [facility];  Service: Urology;  Laterality: Right;  . prosatectomy          Home Medications    Prior to Admission medications   Medication Sig Start Date End Date Taking? Authorizing Provider  amLODipine (NORVASC) 10 MG tablet Take 10 mg at bedtime by mouth.     [provider]  aspirin EC 81 MG tablet Take 81 mg daily by mouth.    [provider]  atorvastatin (LIPITOR) 20 MG tablet Take  20 mg at bedtime by mouth.     [provider]  azithromycin (ZITHROMAX) 250 MG tablet Take two tablets on day 1, then one tablet daily x 4 days Patient not taking: Reported on 05/30/2019 12/15/18   Inda Coke, PA  carvedilol (COREG) 25 MG tablet Take 25 mg 2 (two) times daily by mouth.     [provider]  Cholecalciferol (VITAMIN D) 2000 units CAPS Take 2,000 Units 2 (two) times daily by mouth.     [provider]  isosorbide mononitrate (IMDUR) 30 MG 24 hr tablet Take 30 mg at bedtime by  mouth.     [provider]  omeprazole (PRILOSEC) 20 MG capsule Take 20 mg by mouth 2 (two) times a day.     [provider]  oxyCODONE-acetaminophen (PERCOCET) 5-325 MG tablet Take 1-2 tablets by mouth every 6 (six) hours as needed. 05/30/19   Veryl Speak, MD  predniSONE (DELTASONE) 5 MG tablet Take 1 tablet (5 mg total) by mouth daily with breakfast. Resume once you have completed a taper of prednisone as instructed 05/19/16   Ghimire, Henreitta Leber, MD    Family History Family History  Problem Relation Age of Onset  . COPD Father   . Cancer Other        prostate    Social History Social History   Tobacco Use  . Smoking status: Former Smoker    Last attempt to quit: 12/30/1978    Years since quitting: 40.4  . Smokeless tobacco: Never Used  Substance Use Topics  . Alcohol use: No  . Drug use: No     Allergies   Patient has no known allergies.   Review of Systems Review of Systems  All other systems reviewed and are negative.    Physical Exam Updated Vital Signs BP 138/78 (BP Location: Left Arm)   Pulse 63   Temp 98.7 F (37.1 C) (Oral)   Resp 10   Ht 1.803 m (5\' 11" )   Wt 108.8 kg   SpO2 95%   BMI 33.45 kg/m   Physical Exam Vitals signs and nursing note reviewed.  Constitutional:      General: He is not in acute distress.    Appearance: He is well-developed.  HENT:     Head: Normocephalic and atraumatic.     Right Ear: External ear normal.     Left Ear: External ear normal.  Eyes:     General: No scleral icterus.       Right eye: No discharge.        Left eye: No discharge.     Conjunctiva/sclera: Conjunctivae normal.  Neck:     Musculoskeletal: Neck supple.     Trachea: No tracheal deviation.  Cardiovascular:     Rate and Rhythm: Normal rate and regular rhythm.  Pulmonary:     Effort: Pulmonary effort is normal. No respiratory distress.     Breath sounds: Normal breath sounds. No stridor. No wheezing or rales.  Abdominal:      General: Bowel sounds are normal. There is no distension.     Palpations: Abdomen is soft.     Tenderness: There is no abdominal tenderness. There is left CVA tenderness. There is no guarding or rebound.  Musculoskeletal:        General: No tenderness.  Skin:    General: Skin is warm and dry.     Findings: No rash.  Neurological:     Mental Status: He is alert.  Cranial Nerves: No cranial nerve deficit (no facial droop, extraocular movements intact, no slurred speech).     Sensory: No sensory deficit.     Motor: No abnormal muscle tone or seizure activity.     Coordination: Coordination normal.      ED Treatments / Results  Labs (all labs ordered are listed, but only abnormal results are displayed) Labs Reviewed  CBC WITH DIFFERENTIAL/PLATELET - Abnormal; Notable for the following components:      Result Value   WBC 12.6 (*)    Neutro Abs 10.4 (*)    All other components within normal limits  COMPREHENSIVE METABOLIC PANEL - Abnormal; Notable for the following components:   Glucose, Bld 115 (*)    BUN 28 (*)    Creatinine, Ser 1.68 (*)    Calcium 8.5 (*)    Total Bilirubin 1.3 (*)    GFR calc non Af Amer 40 (*)    GFR calc Af Amer 46 (*)    All other components within normal limits  LIPASE, BLOOD    EKG None  Radiology No results found.  Procedures Procedures (including critical care time)  Medications Ordered in ED Medications  HYDROmorphone (DILAUDID) injection 1 mg (1 mg Intravenous Given 05/31/19 2032)  ondansetron (ZOFRAN) injection 4 mg (4 mg Intravenous Given 05/31/19 2032)  ketorolac (TORADOL) 30 MG/ML injection 15 mg (15 mg Intravenous Given 05/31/19 2032)     Initial Impression / Assessment and Plan / ED Course  I have reviewed the triage vital signs and the nursing notes.  Pertinent labs & imaging results that were available during my care of the patient were reviewed by me and considered in my medical decision making (see chart for details).   Clinical Course as of May 31 1300  Mon May 31, 2019  2107 Slight increase in creatinine compared to previous.  White blood cell count does show mild leukocytosis   [JK]  2108 he had a urinalysis yesterday that did not suggest infection   [JK]    Clinical Course User Index [JK] Dorie Rank, MD     Pt presented with recurrent renal colic.   Sx improved after ED treatment.  Labs notable for slight increase in creatinine.  Encouraged pt to follow up with urologist ASAP considering his persistent pain.  Final Clinical Impressions(s) / ED Diagnoses   Final diagnoses:  Renal colic    ED Discharge Orders    None       Dorie Rank, MD 06/01/19 1301

## 2019-06-01 ENCOUNTER — Other Ambulatory Visit: Payer: Self-pay | Admitting: Urology

## 2019-06-01 DIAGNOSIS — N201 Calculus of ureter: Secondary | ICD-10-CM | POA: Diagnosis not present

## 2019-06-01 DIAGNOSIS — Z8546 Personal history of malignant neoplasm of prostate: Secondary | ICD-10-CM | POA: Diagnosis not present

## 2019-06-02 NOTE — Patient Instructions (Addendum)
Bruce Ball    Your procedure is scheduled on: 06-09-2019  Report to [facility] Main  Entrance  Report to admitting at 115 PM   [place] [facility] TEST ON Monday 06-07-2019 @ 1000 AM, THIS TEST MUST BE DONE BEFORE SURGERY, COME TO [facility].    Call this number if you have problems the morning of surgery [phone]    Remember: Do not eat food :After Midnight. CLEAR LIQUIDS FROM MIDNIGHT UNTIL 915 AM, THEN NOTHING BY MOUTH AFTER 915 AM. BRUSH YOUR TEETH MORNING OF SURGERY AND RINSE YOUR MOUTH OUT, NO CHEWING GUM CANDY OR MINTS.     CLEAR LIQUID DIET   Foods Allowed                                                                     Foods Excluded  Coffee and tea, regular and decaf                             liquids that you cannot  Plain Jell-O in any flavor                                             see through such as: Fruit ices (not with fruit pulp)                                     milk, soups, orange juice  Iced Popsicles                                    All solid food Carbonated beverages, regular and diet                                    Cranberry, grape and apple juices Sports drinks like Gatorade Lightly seasoned clear broth or consume(fat free) Sugar, honey syrup  Sample Menu Breakfast                                Lunch                                     Supper Cranberry juice                    Beef broth                            Chicken broth Jell-O  Grape juice                           Apple juice Coffee or tea                        Jell-O                                      Popsicle                                                Coffee or tea                        Coffee or tea  _____________________________________________________________________     Take these medicines the morning of surgery with A SIP OF WATER: CARVIDEILOL (COREG),  ISOSORBIDE MONONITRATE (IMDUR), OMEPRAZOLE (PRILOSEC), HYDROMORPHONE IF NEEDED, PREDNISONE   CLEAR LIQUID DIET   Foods Allowed                                                                     Foods Excluded  Coffee and tea, regular and decaf                             liquids that you cannot  Plain Jell-O in any flavor                                             see through such as: Fruit ices (not with fruit pulp)                                     milk, soups, orange juice  Iced Popsicles                                    All solid food Carbonated beverages, regular and diet                                    Cranberry, grape and apple juices Sports drinks like Gatorade Lightly seasoned clear broth or consume(fat free) Sugar, honey syrup  Sample Menu Breakfast                                Lunch                                     Supper Cranberry juice  Beef broth                            Chicken broth Jell-O                                     Grape juice                           Apple juice Coffee or tea                        Jell-O                                      Popsicle                                                Coffee or tea                        Coffee or tea  _____________________________________________________________________                                 Bruce Ball Bast may not have any metal on your body including hair pins and              piercings  Do not wear jewelry, make-up, lotions, powders or perfumes, deodorant             Do not wear nail polish.  Do not shave  48 hours prior to surgery.              Men may shave face and neck.   Do not bring valuables to the hospital. [name].  Contacts, dentures or bridgework may not be worn into surgery.  Leave suitcase in the car. After surgery it may be brought to your room.     Patients discharged the day of surgery will not be  allowed to drive home. IF YOU ARE HAVING SURGERY AND GOING HOME THE SAME DAY, YOU MUST HAVE AN ADULT TO DRIVE YOU HOME AND BE WITH YOU FOR 24 HOURS. YOU MAY GO HOME BY TAXI OR UBER OR ORTHERWISE, BUT AN ADULT MUST ACCOMPANY YOU HOME AND STAY WITH YOU FOR 24 HOURS.  Name and phone number of your driver:Bruce Ball [facility] CELL [phone]  Special Instructions: N/A              Please read over the following fact sheets you were given: _____________________________________________________________________             [facility] - Preparing for Surgery Before surgery, you can play an important role.  Because skin is not sterile, your skin needs to be as free of germs as possible.  You can reduce the number of germs on your skin by washing with CHG (chlorahexidine gluconate) soap before surgery.  CHG is an antiseptic cleaner which kills germs and bonds with the skin to continue  killing germs even after washing. Please DO NOT use if you have an allergy to CHG or antibacterial soaps.  If your skin becomes reddened/irritated stop using the CHG and inform your nurse when you arrive at Short Stay. Do not shave (including legs and underarms) for at least 48 hours prior to the first CHG shower.  You may shave your face/neck. Please follow these instructions carefully:  1.  Shower with CHG Soap the night before surgery and the  morning of Surgery.  2.  If you choose to wash your hair, wash your hair first as usual with your  normal  shampoo.  3.  After you shampoo, rinse your hair and body thoroughly to remove the  shampoo.                           4.  Use CHG as you would any other liquid soap.  You can apply chg directly  to the skin and wash                       Gently with a scrungie or clean washcloth.  5.  Apply the CHG Soap to your body ONLY FROM THE NECK DOWN.   Do not use on face/ open                           Wound or open sores. Avoid contact with eyes, ears mouth and genitals (private parts).                        Wash face,  Genitals (private parts) with your normal soap.             6.  Wash thoroughly, paying special attention to the area where your surgery  will be performed.  7.  Thoroughly rinse your body with warm water from the neck down.  8.  DO NOT shower/wash with your normal soap after using and rinsing off  the CHG Soap.                9.  Pat yourself dry with a clean towel.            10.  Wear clean pajamas.            11.  Place clean sheets on your bed the night of your first shower and do not  sleep with pets. Day of Surgery : Do not apply any lotions/deodorants the morning of surgery.  Please wear clean clothes to the hospital/surgery center.  FAILURE TO FOLLOW THESE INSTRUCTIONS MAY RESULT IN THE CANCELLATION OF YOUR SURGERY PATIENT SIGNATURE_________________________________  NURSE SIGNATURE__________________________________  ________________________________________________________________________

## 2019-06-03 ENCOUNTER — Other Ambulatory Visit: Payer: Self-pay

## 2019-06-03 ENCOUNTER — Encounter (HOSPITAL_COMMUNITY)
Admission: RE | Admit: 2019-06-03 | Discharge: 2019-06-03 | Disposition: A | Discharge status: Home / Self Care | Payer: Medicare Other | Source: Ambulatory Visit | Attending: Urology | Admitting: Urology

## 2019-06-03 ENCOUNTER — Encounter (HOSPITAL_COMMUNITY): Payer: Self-pay

## 2019-06-03 DIAGNOSIS — Z01818 Encounter for other preprocedural examination: Secondary | ICD-10-CM | POA: Insufficient documentation

## 2019-06-03 HISTORY — DX: Unspecified hearing loss, right ear: H91.91

## 2019-06-03 HISTORY — DX: Gastro-esophageal reflux disease without esophagitis: K21.9

## 2019-06-03 LAB — BASIC METABOLIC PANEL
Anion gap: 8 (ref 5–15)
BUN: 24 mg/dL — ABNORMAL HIGH (ref 8–23)
CO2: 24 mmol/L (ref 22–32)
Calcium: 8.2 mg/dL — ABNORMAL LOW (ref 8.9–10.3)
Chloride: 109 mmol/L (ref 98–111)
Creatinine, Ser: 1.27 mg/dL — ABNORMAL HIGH (ref 0.61–1.24)
GFR calc Af Amer: >60 mL/min (ref >60)
GFR calc non Af Amer: 56 mL/min — ABNORMAL LOW (ref >60)
Glucose, Bld: 107 mg/dL — ABNORMAL HIGH (ref 70–99)
Potassium: 3.8 mmol/L (ref 3.5–5.1)
Sodium: 141 mmol/L (ref 135–145)

## 2019-06-03 LAB — CBC
HCT: 46.1 % (ref 39.0–52.0)
Hemoglobin: 14.6 g/dL (ref 13.0–17.0)
MCH: 28 pg (ref 26.0–34.0)
MCHC: 31.7 g/dL (ref 30.0–36.0)
MCV: 88.3 fL (ref 80.0–100.0)
Platelets: 202 10*3/uL (ref 150–400)
RBC: 5.22 MIL/uL (ref 4.22–5.81)
RDW: 13.2 % (ref 11.5–15.5)
WBC: 8.5 10*3/uL (ref 4.0–10.5)
nRBC: 0 % (ref 0.0–0.2)

## 2019-06-03 NOTE — Progress Notes (Signed)
CHEST XRAY 12-15-18 EPIC

## 2019-06-07 ENCOUNTER — Other Ambulatory Visit (HOSPITAL_COMMUNITY)
Admission: RE | Admit: 2019-06-07 | Discharge: 2019-06-07 | Disposition: A | Discharge status: Home / Self Care | Payer: Medicare Other | Source: Ambulatory Visit | Attending: Urology | Admitting: Urology

## 2019-06-07 DIAGNOSIS — Z1159 Encounter for screening for other viral diseases: Secondary | ICD-10-CM | POA: Insufficient documentation

## 2019-06-08 LAB — NOVEL CORONAVIRUS, NAA (HOSP ORDER, SEND-OUT TO REF LAB; TAT 18-24 HRS): SARS-CoV-2, NAA: NOT DETECTED

## 2019-06-09 ENCOUNTER — Ambulatory Visit (HOSPITAL_COMMUNITY): Payer: Medicare Other

## 2019-06-09 ENCOUNTER — Encounter (HOSPITAL_COMMUNITY): Payer: Self-pay

## 2019-06-09 ENCOUNTER — Ambulatory Visit (HOSPITAL_COMMUNITY): Payer: Medicare Other | Admitting: Certified Registered Nurse Anesthetist

## 2019-06-09 ENCOUNTER — Ambulatory Visit (HOSPITAL_COMMUNITY)
Admission: RE | Admit: 2019-06-09 | Discharge: 2019-06-09 | Disposition: A | Discharge status: Home / Self Care | Payer: Medicare Other | Source: Other Acute Inpatient Hospital | Attending: Urology | Admitting: Urology

## 2019-06-09 ENCOUNTER — Encounter (HOSPITAL_COMMUNITY)
Admission: RE | Disposition: A | Discharge status: Home / Self Care | Payer: Self-pay | Source: Other Acute Inpatient Hospital | Attending: Urology

## 2019-06-09 DIAGNOSIS — G8929 Other chronic pain: Secondary | ICD-10-CM | POA: Diagnosis not present

## 2019-06-09 DIAGNOSIS — Z7952 Long term (current) use of systemic steroids: Secondary | ICD-10-CM | POA: Insufficient documentation

## 2019-06-09 DIAGNOSIS — Z7982 Long term (current) use of aspirin: Secondary | ICD-10-CM | POA: Insufficient documentation

## 2019-06-09 DIAGNOSIS — Z79899 Other long term (current) drug therapy: Secondary | ICD-10-CM | POA: Insufficient documentation

## 2019-06-09 DIAGNOSIS — Q630 Accessory kidney: Secondary | ICD-10-CM | POA: Diagnosis not present

## 2019-06-09 DIAGNOSIS — N202 Calculus of kidney with calculus of ureter: Secondary | ICD-10-CM | POA: Insufficient documentation

## 2019-06-09 DIAGNOSIS — Z87891 Personal history of nicotine dependence: Secondary | ICD-10-CM | POA: Diagnosis not present

## 2019-06-09 DIAGNOSIS — K219 Gastro-esophageal reflux disease without esophagitis: Secondary | ICD-10-CM | POA: Insufficient documentation

## 2019-06-09 DIAGNOSIS — Z6833 Body mass index (BMI) 33.0-33.9, adult: Secondary | ICD-10-CM | POA: Insufficient documentation

## 2019-06-09 DIAGNOSIS — Z87442 Personal history of urinary calculi: Secondary | ICD-10-CM | POA: Diagnosis not present

## 2019-06-09 DIAGNOSIS — I10 Essential (primary) hypertension: Secondary | ICD-10-CM | POA: Insufficient documentation

## 2019-06-09 DIAGNOSIS — H9191 Unspecified hearing loss, right ear: Secondary | ICD-10-CM | POA: Diagnosis not present

## 2019-06-09 DIAGNOSIS — Z9079 Acquired absence of other genital organ(s): Secondary | ICD-10-CM | POA: Insufficient documentation

## 2019-06-09 DIAGNOSIS — M549 Dorsalgia, unspecified: Secondary | ICD-10-CM | POA: Insufficient documentation

## 2019-06-09 DIAGNOSIS — E669 Obesity, unspecified: Secondary | ICD-10-CM | POA: Diagnosis not present

## 2019-06-09 DIAGNOSIS — M79606 Pain in leg, unspecified: Secondary | ICD-10-CM | POA: Diagnosis not present

## 2019-06-09 DIAGNOSIS — Z8546 Personal history of malignant neoplasm of prostate: Secondary | ICD-10-CM | POA: Insufficient documentation

## 2019-06-09 DIAGNOSIS — E785 Hyperlipidemia, unspecified: Secondary | ICD-10-CM | POA: Diagnosis not present

## 2019-06-09 DIAGNOSIS — Z96651 Presence of right artificial knee joint: Secondary | ICD-10-CM | POA: Insufficient documentation

## 2019-06-09 DIAGNOSIS — Z79891 Long term (current) use of opiate analgesic: Secondary | ICD-10-CM | POA: Insufficient documentation

## 2019-06-09 HISTORY — PX: CYSTOSCOPY WITH RETROGRADE PYELOGRAM, URETEROSCOPY AND STENT PLACEMENT: SHX5789

## 2019-06-09 SURGERY — CYSTOURETEROSCOPY, WITH RETROGRADE PYELOGRAM AND STENT INSERTION
Anesthesia: General | Laterality: Left

## 2019-06-09 MED ORDER — IOHEXOL 300 MG/ML  SOLN
INTRAMUSCULAR | Status: DC | PRN
  Administered 2019-06-09: 26 mL via URETHRAL

## 2019-06-09 MED ORDER — PROPOFOL 10 MG/ML IV BOLUS
INTRAVENOUS | Status: DC | PRN
  Administered 2019-06-09: 150 mg via INTRAVENOUS

## 2019-06-09 MED ORDER — EPHEDRINE SULFATE-NACL 50-0.9 MG/10ML-% IV SOSY
PREFILLED_SYRINGE | INTRAVENOUS | Status: DC | PRN
  Administered 2019-06-09: 10 mg via INTRAVENOUS

## 2019-06-09 MED ORDER — PROMETHAZINE HCL 25 MG/ML IJ SOLN: 6.2500 mg | INTRAMUSCULAR | Status: DC | PRN

## 2019-06-09 MED ORDER — DEXAMETHASONE SODIUM PHOSPHATE 10 MG/ML IJ SOLN
INTRAMUSCULAR | Status: DC | PRN
  Administered 2019-06-09: 8 mg via INTRAVENOUS

## 2019-06-09 MED ORDER — PROPOFOL 10 MG/ML IV BOLUS
INTRAVENOUS | Status: AC
  Filled 2019-06-09: qty 20

## 2019-06-09 MED ORDER — ONDANSETRON HCL 4 MG/2ML IJ SOLN
INTRAMUSCULAR | Status: DC | PRN
  Administered 2019-06-09: 4 mg via INTRAVENOUS

## 2019-06-09 MED ORDER — ONDANSETRON HCL 4 MG/2ML IJ SOLN
INTRAMUSCULAR | Status: AC
  Filled 2019-06-09: qty 2

## 2019-06-09 MED ORDER — SODIUM CHLORIDE 0.9 % IR SOLN
Status: DC | PRN
  Administered 2019-06-09: 3000 mL via INTRAVESICAL

## 2019-06-09 MED ORDER — HYDROMORPHONE HCL 2 MG PO TABS: 2.0000 mg | ORAL_TABLET | ORAL | 0 refills | Status: DC | PRN

## 2019-06-09 MED ORDER — DEXAMETHASONE SODIUM PHOSPHATE 10 MG/ML IJ SOLN
INTRAMUSCULAR | Status: AC
  Filled 2019-06-09: qty 1

## 2019-06-09 MED ORDER — CEPHALEXIN 500 MG PO CAPS: 500.0000 mg | ORAL_CAPSULE | Freq: Two times a day (BID) | ORAL | 0 refills | Status: DC

## 2019-06-09 MED ORDER — FENTANYL CITRATE (PF) 100 MCG/2ML IJ SOLN: 25.0000 ug | INTRAMUSCULAR | Status: DC | PRN

## 2019-06-09 MED ORDER — EPHEDRINE 5 MG/ML INJ
INTRAVENOUS | Status: AC
  Filled 2019-06-09: qty 10

## 2019-06-09 MED ORDER — LIDOCAINE 2% (20 MG/ML) 5 ML SYRINGE
INTRAMUSCULAR | Status: DC | PRN
  Administered 2019-06-09: 80 mg via INTRAVENOUS

## 2019-06-09 MED ORDER — SODIUM CHLORIDE 0.9 % IV SOLN
2.0000 g | INTRAVENOUS | Status: AC
  Administered 2019-06-09: 2 g via INTRAVENOUS
  Filled 2019-06-09: qty 20

## 2019-06-09 MED ORDER — FENTANYL CITRATE (PF) 100 MCG/2ML IJ SOLN
INTRAMUSCULAR | Status: AC
  Filled 2019-06-09: qty 2

## 2019-06-09 MED ORDER — FENTANYL CITRATE (PF) 100 MCG/2ML IJ SOLN
INTRAMUSCULAR | Status: DC | PRN
  Administered 2019-06-09 (×2): 50 ug via INTRAVENOUS

## 2019-06-09 MED ORDER — LACTATED RINGERS IV SOLN
INTRAVENOUS | Status: DC
  Administered 2019-06-09: 14:00:00 via INTRAVENOUS

## 2019-06-09 SURGICAL SUPPLY — 24 items
BAG URO CATCHER STRL LF (MISCELLANEOUS) ×2 IMPLANT
BASKET LASER NITINOL 1.9FR (BASKET) ×1 IMPLANT
BSKT STON RTRVL 120 1.9FR (BASKET) ×1
CATH INTERMIT  6FR 70CM (CATHETERS) ×2 IMPLANT
CLOTH BEACON ORANGE TIMEOUT ST (SAFETY) ×2 IMPLANT
COVER SURGICAL LIGHT HANDLE (MISCELLANEOUS) ×2 IMPLANT
COVER WAND RF STERILE (DRAPES) IMPLANT
EXTRACTOR STONE 1.7FRX115CM (UROLOGICAL SUPPLIES) IMPLANT
FIBER LASER FLEXIVA 1000 (UROLOGICAL SUPPLIES) IMPLANT
FIBER LASER FLEXIVA 365 (UROLOGICAL SUPPLIES) IMPLANT
FIBER LASER FLEXIVA 550 (UROLOGICAL SUPPLIES) IMPLANT
FIBER LASER TRAC TIP (UROLOGICAL SUPPLIES) IMPLANT
GLOVE BIOGEL M STRL SZ7.5 (GLOVE) ×2 IMPLANT
GOWN STRL REUS W/TWL LRG LVL3 (GOWN DISPOSABLE) ×2 IMPLANT
GUIDEWIRE ANG ZIPWIRE 038X150 (WIRE) ×2 IMPLANT
GUIDEWIRE STR DUAL SENSOR (WIRE) ×2 IMPLANT
KIT TURNOVER KIT A (KITS) ×1 IMPLANT
MANIFOLD NEPTUNE II (INSTRUMENTS) ×2 IMPLANT
PACK CYSTO (CUSTOM PROCEDURE TRAY) ×2 IMPLANT
SHEATH URETERAL 12FRX28CM (UROLOGICAL SUPPLIES) IMPLANT
SHEATH URETERAL 12FRX35CM (MISCELLANEOUS) ×1 IMPLANT
STENT PERCUFLEX 4.8FRX26 (STENTS) ×2 IMPLANT
TUBE FEEDING 8FR 16IN STR KANG (MISCELLANEOUS) ×2 IMPLANT
TUBING CONNECTING 10 (TUBING) ×2 IMPLANT

## 2019-06-09 NOTE — Anesthesia Postprocedure Evaluation (Signed)
Anesthesia Post Note  Patient: Bruce Ball  Procedure(s) Performed: CYSTOSCOPY WITH RETROGRADE PYELOGRAM, URETEROSCOPY AND STENT PLACEMENT X2 (Left )     Patient location during evaluation: PACU Anesthesia Type: General Level of consciousness: awake and alert Pain management: pain level controlled Vital Signs Assessment: post-procedure vital signs reviewed and stable Respiratory status: spontaneous breathing, nonlabored ventilation, respiratory function stable and patient connected to nasal cannula oxygen Cardiovascular status: blood pressure returned to baseline and stable Postop Assessment: no apparent nausea or vomiting Anesthetic complications: no    Last Vitals:  Vitals:   06/09/19 1645 06/09/19 1700  BP: (!) 158/72 (!) 173/81  Pulse: 62 66  Resp: 13 12  Temp:    SpO2: 100% 98%    Last Pain:  Vitals:   06/09/19 1700  TempSrc:   PainSc: (P) 3                  Faust Thorington,Edem S

## 2019-06-09 NOTE — Transfer of Care (Signed)
Immediate Anesthesia Transfer of Care Note  Patient: Bruce Ball  Procedure(s) Performed: CYSTOSCOPY WITH RETROGRADE PYELOGRAM, URETEROSCOPY AND STENT PLACEMENT X2 (Left )  Patient Location: PACU  Anesthesia Type:General  Level of Consciousness: awake, drowsy and patient cooperative  Airway & Oxygen Therapy: Patient Spontanous Breathing and Patient connected to face mask oxygen  Post-op Assessment: Report given to RN and Post -op Vital signs reviewed and stable  Post vital signs: Reviewed and stable  Last Vitals:  Vitals Value Taken Time  BP 163/90 06/09/2019  4:41 PM  Temp    Pulse 68 06/09/2019  4:42 PM  Resp 10 06/09/2019  4:42 PM  SpO2 100 % 06/09/2019  4:42 PM  Vitals shown include unvalidated device data.  Last Pain:  Vitals:   06/09/19 1340  TempSrc:   PainSc: 0-No pain         Complications: No apparent anesthesia complications

## 2019-06-09 NOTE — Discharge Instructions (Signed)
1 - You may have urinary urgency (bladder spasms) and bloody urine on / off with stent in place. This is normal.  2 - Remove tethered stent on Friday morning at home by pulling on the strings, then blue plastic tubing and discarding. There are TWO stents.   3 - Call MD or go to ER for fever >102, severe pain / nausea / vomiting not relieved by medications, or acute change in medical status

## 2019-06-09 NOTE — Anesthesia Procedure Notes (Addendum)
Procedure Name: LMA Insertion Date/Time: 06/09/2019 3:47 PM Performed by: West Pugh, CRNA Pre-anesthesia Checklist: Patient identified, Emergency Drugs available, Suction available, Patient being monitored and Timeout performed Patient Re-evaluated:Patient Re-evaluated prior to induction Oxygen Delivery Method: Circle system utilized Preoxygenation: Pre-oxygenation with 100% oxygen Induction Type: IV induction LMA: LMA with gastric port inserted LMA Size: 5.0 Number of attempts: 1 Placement Confirmation: positive ETCO2 and breath sounds checked- equal and bilateral Tube secured with: Tape Dental Injury: Teeth and Oropharynx as per pre-operative assessment

## 2019-06-09 NOTE — Anesthesia Preprocedure Evaluation (Signed)
Anesthesia Evaluation  Patient identified by MRN, date of birth, ID band Patient awake    Reviewed: Allergy & Precautions, NPO status , Patient's Chart, lab work & pertinent test results  Airway Mallampati: II  TM Distance: >3 FB Neck ROM: Full    Dental no notable dental hx.    Pulmonary neg pulmonary ROS, former smoker,    Pulmonary exam normal breath sounds clear to auscultation       Cardiovascular hypertension, Normal cardiovascular exam Rhythm:Regular Rate:Normal     Neuro/Psych negative neurological ROS  negative psych ROS   GI/Hepatic Neg liver ROS, GERD  Medicated,  Endo/Other  negative endocrine ROS  Renal/GU negative Renal ROS  negative genitourinary   Musculoskeletal negative musculoskeletal ROS (+)   Abdominal   Peds negative pediatric ROS (+)  Hematology negative hematology ROS (+)   Anesthesia Other Findings   Reproductive/Obstetrics negative OB ROS                             Anesthesia Physical Anesthesia Plan  ASA: II  Anesthesia Plan: General   Post-op Pain Management:    Induction: Intravenous  PONV Risk Score and Plan: 2 and Ondansetron, Dexamethasone and Treatment may vary due to age or medical condition  Airway Management Planned: LMA  Additional Equipment:   Intra-op Plan:   Post-operative Plan: Extubation in OR  Informed Consent: I have reviewed the patients History and Physical, chart, labs and discussed the procedure including the risks, benefits and alternatives for the proposed anesthesia with the patient or authorized representative who has indicated his/her understanding and acceptance.     Dental advisory given  Plan Discussed with: CRNA and Surgeon  Anesthesia Plan Comments:         Anesthesia Quick Evaluation

## 2019-06-09 NOTE — Brief Op Note (Signed)
06/09/2019  4:29 PM  PATIENT:  [name]  73 y.o. male  PRE-OPERATIVE DIAGNOSIS:  LEFT URETERAL STONE  POST-OPERATIVE DIAGNOSIS:  LEFT URETERAL STONE  PROCEDURE:  Procedure(s): CYSTOSCOPY WITH RETROGRADE PYELOGRAM, URETEROSCOPY AND STENT PLACEMENT X2 (Left) Basket of stones  SURGEON:  Surgeon(s) and Role:    * [name], MD - Primary  PHYSICIAN ASSISTANT:   ASSISTANTS: none   ANESTHESIA:   general  EBL:  minimal   BLOOD ADMINISTERED:none  DRAINS: none   LOCAL MEDICATIONS USED:  NONE  SPECIMEN:  Source of Specimen:  left renal / ureteral stones  DISPOSITION OF SPECIMEN:  [facility] for compositional analysis  COUNTS:  YES  TOURNIQUET:  * No tourniquets in log *  DICTATION: .Other Dictation: Dictation Number [id number]  PLAN OF CARE: Discharge to home after PACU  PATIENT DISPOSITION:  PACU - hemodynamically stable.   Delay start of Pharmacological VTE agent (>24hrs) due to surgical blood loss or risk of bleeding: yes

## 2019-06-09 NOTE — H&P (Signed)
Bruce Ball is an 73 y.o. male.    Chief Complaint: Pre-OP LEFT Ureteroscopic Stone Manipulation  HPI:   1- Recurrent Urolithiasis -  Pre 2020 many stones treated with SWL, URS, MET  05/2019 - Left 54m mid stone (iliac crossing) and bilateral scattered punctate stoens (about 1cm each side) by ER CT. Cr. 1.7. UA withtou infectious parameters. SSD 18cm, 700HU.   2 - [facility].   PMH sig for Rt TKR, obeisty, DJD, HTN. NO ischemic CV disease / blood thinners. His PCP is [facility]   Today " Bruce Ball" is seen to proceed with LEFT ureteroscopic stone manipulation. NO intervlal fevers or stone passage.     Past Medical History:  Diagnosis Date  . Chronic back pain greater than 3 months duration   . Chronic leg pain   . Deafness in right ear   . Extrinsic asthma, unspecified 04/07/2009   PT DENIES  . GERD (gastroesophageal reflux disease)   . History of kidney stones   . HYPERTENSION 10/29/2007  . HYPOTENSION 08/13/2010  . LOW BACK PAIN 04/21/2008  . NEPHROLITHIASIS, HX OF 04/21/2008  . PUD, HX OF 04/21/2008  . Renal disorder     Past Surgical History:  Procedure Laterality Date  . bone cancer  1970   rt femur removed and steel rod placed  . CYSTOSCOPY WITH RETROGRADE PYELOGRAM, URETEROSCOPY AND STENT PLACEMENT Right 11/06/2017   Procedure: CYSTOSCOPY WITH  STENT PLACEMENT RIGHT;  Surgeon: Bruce Ball;  Location: [facility];  Service: Urology;  Laterality: Right;  . CYSTOSCOPY/RETROGRADE/URETEROSCOPY     1 year ago at [facility] . CYSTOSCOPY/URETEROSCOPY/HOLMIUM LASER Right 11/24/2017   Procedure: CYSTOSCOPY/URETEROSCOPY/ RETROGRADE/STENT REMOVAL;  Surgeon: Bruce Ball;  Location: [facility];  Service: Urology;  Laterality: Right;  . prosatectomy      Family History  Problem Relation Age of Onset  . COPD Father   . Cancer Other        prostate   Social History:   reports that he quit smoking about 40 years ago. He has never used smokeless tobacco. He reports that he does not drink alcohol or use drugs.  Allergies: No Known Allergies  Medications Prior to Admission  Medication Sig Dispense Refill  . amLODipine (NORVASC) 10 MG tablet Take 10 mg at bedtime by mouth.     .Marland Kitchenaspirin EC 81 MG tablet Take 81 mg daily by mouth.    .Marland Kitchenatorvastatin (LIPITOR) 20 MG tablet Take 20 mg at bedtime by mouth.     .Marland Kitchenazithromycin (ZITHROMAX) 250 MG tablet Take two tablets on day 1, then one tablet daily x 4 days 6 each 0  . carvedilol (COREG) 25 MG tablet Take 25 mg 2 (two) times daily by mouth.     . Cholecalciferol (VITAMIN D) 2000 units CAPS Take 2,000 Units 2 (two) times daily by mouth.     .Marland KitchenHYDROmorphone (DILAUDID) 2 MG tablet Take by mouth every 4 (four) hours as needed for severe pain.    . isosorbide mononitrate (IMDUR) 30 MG 24 hr tablet Take 30 mg at bedtime by mouth.     .Marland Kitchenomeprazole (PRILOSEC) 20 MG capsule Take 20 mg by mouth 2 (two) times a day.     . predniSONE (DELTASONE) 5 MG tablet Take 1 tablet (5 mg total) by mouth daily with breakfast. Resume once you have completed a taper of  prednisone as instructed      No results found for this or any previous visit (from the past 48 hour(s)). No results found.  Review of Systems  Constitutional: Negative for chills and fever.  Genitourinary: Positive for flank pain.  All other systems reviewed and are negative.   Blood pressure (!) 142/73, pulse (!) 56, temperature 98.4 F (36.9 C), temperature source Oral, resp. rate 18, height '5\' 11"'  (1.803 m), weight 109.3 kg, SpO2 95 %. Physical Exam  Constitutional: He appears well-developed.  HENT:  Head: Normocephalic.  Eyes: Pupils are equal, round, and reactive to light.  Neck: Normal range of motion.  Cardiovascular: Normal rate.  Respiratory: Effort normal.  GI:  Stable truncal obesity  Genitourinary:    Genitourinary Comments: Mild LEFT cvat.     Musculoskeletal: Normal range of motion.  Neurological: He is alert.     Assessment/Plan  Proceed as panned with LEFT ureteroscopic stone manipulaiton with goal of left side stone free. Risks, benefits, alternatives, expected peri-op course disucssed previously and reiterated today.   Bruce Ball 06/09/2019, 3:02 PM

## 2019-06-10 ENCOUNTER — Encounter (HOSPITAL_COMMUNITY): Payer: Self-pay | Admitting: Urology

## 2019-06-10 DIAGNOSIS — N200 Calculus of kidney: Secondary | ICD-10-CM | POA: Diagnosis not present

## 2019-06-10 NOTE — Op Note (Signed)
NAME: BRYLAN, DEC MEDICAL RECORD [id number] ACCOUNT [id number] DATE OF BIRTH:07-01-1946 FACILITY: [facility] LOCATION: [facility] PH[facility]ICIAN:Gen Clagg Tresa [name], MD  OPERATIVE REPORT  DATE OF PROCEDURE:  06/09/2019  PREOPERATIVE DIAGNOSIS:  Left ureteral and bilateral renal stones.  PROCEDURE: 1.  Cystoscopy, left retrograde pyelogram. 2.  Left ureteroscopy, basketing of stones. 3.  Insertion of left ureteral stent 4.8 x 26 x 2 on the left; 1 in the upper pole moiety, 1 in the lower pole moiety.  ESTIMATED BLOOD LOSS:  Nil.  MEDICATIONS:  None.  SPECIMENS:  Left renal and ureteral stone fragments for analysis.  FINDINGS: 1.  Left distal common ureteral stone. 2.  Duplex left kidney. 3.  Complete resolution of all accessible stone fragments larger than 130 mm following basket extraction on the left. 4.  Successful placement of left ureteral stent, proximal end in the renal pelvis, distal end in the urinary bladder x2.  INDICATIONS:  The patient is a 73 year old gentleman with history of recurrent nephrolithiasis as well as prostate cancer.  He is status post prostatectomy and managed at the [facility] for his prostate cancer.  He is completely incontinent.  He also has a  history of recurrent urolithiasis.  He was seen in our office several days ago to have colicky flank pain after ERCP confirmed left mid ureteral stone.  Options discussed for further management including medical therapy versus shock wave lithotripsy  versus ureteroscopy, and he wished to proceed with the latter with goal of left-sided stone free.  Informed consent was then placed in the medical record.  DESCRIPTION OF PROCEDURE:  The patient being identified and procedure being left ureteroscopic stimulation was confirmed.  Procedure timeout was performed.  IV antibiotics were administered.  General anesthesia was induced.  The patient was placed into a  low lithotomy position.  A sterile field was created by prepping and  draping the penis, perineum and proximal thighs using iodine.  Cystourethroscopy was then performed using a 22-French rigid cystoscope with offset lens.  Inspection of the anterior and  posterior urethra revealed surgical absence of the prostate.  Ureteral orifices were singleton.  Next, the left ureteral orifice was cannulated with a 6-French renal catheter and a left retrograde pyelogram was obtained.  Left retrograde pyelogram  demonstrated a duplex left ureter with splitting of the ureters in the distal quarter.  There was minimal hydronephrosis.  The ZIPwire was advanced up to the level of the upper pole moiety and set aside as a safety wire, and a separate 0.038 ZIPwire  advanced into the lower pole moiety and set aside as a separate safety wire.  An 18-French sheath was placed in the urinary bladder for pressure release, and semirigid ureteroscopy was performed of the left distal ureter alongside a separate sensor  working wire.  There was a left ureteral stone in the distal ureter consistent with known stone.  This was before the splitting in the duplex.  This did appear amenable to basketing.  It was basketed with the long axis, removed and set aside for  composition analysis.  Next, semirigid uteroscopy was performed of the distal four-fifths at both the upper pole and lower pole moiety ureters.  No additional calcifications were found.  The semirigid scope was exchanged for a 12/14 medium length ureteral access sheath up to  the upper pole moiety over the sensor working wire using continuous fluoroscopic guidance, and flexible digital ureteroscopy was performed of the left proximal left upper pole moiety ureter and kidney, including all calices x3.  There were multifocal  papillary tip calcifications that were amenable to basketing.  They were grasped with an escape basket, removed and set aside for composition analysis.  The sheath was then backed down to the level of the ureteral bifurcation,  and a sensor wire was  advanced to the level of the lower pole moiety ureter and the sheath advanced to this level using fluoroscopic guidance.  Flexible digital fluoroscopy was performed of the lower pole moiety ureter and kidney.  As per the upper pole, there were multifocal  papillary tip calcifications.  These were amenable to simple basketing.  They were basketed and brought out in entirety and set aside for composition analysis.  The access sheath was then completely removed.  No major mucosal abnormalities were found.   Given the access sheath usage, it was felt that brief interval stenting with tethered stents would be warranted.  As such, a 4.8 double J type stent 26 cm in length was placed at each moiety left ureter, proximal renal pelvis, distally in urinary bladder  and both tethers were fashioned to the dorsum of the penis.  The procedure was terminated.  The patient tolerated the procedure well.  No immediate perioperative complications.  The patient was taken to postanesthesia care unit in stable condition with  plan for discharge home.  LN/NUANCE  D:06/09/2019 T:06/10/2019 JOB:[id number]

## 2019-06-14 DIAGNOSIS — R311 Benign essential microscopic hematuria: Secondary | ICD-10-CM | POA: Diagnosis not present

## 2019-06-14 DIAGNOSIS — R310 Gross hematuria: Secondary | ICD-10-CM

## 2019-06-14 HISTORY — DX: Gross hematuria: R31.0

## 2019-06-23 ENCOUNTER — Encounter: Payer: Self-pay | Admitting: Family Medicine

## 2019-06-23 ENCOUNTER — Encounter: Payer: Self-pay | Admitting: Physical Therapy

## 2019-06-28 DIAGNOSIS — N202 Calculus of kidney with calculus of ureter: Secondary | ICD-10-CM | POA: Diagnosis not present

## 2019-06-28 DIAGNOSIS — N3945 Continuous leakage: Secondary | ICD-10-CM | POA: Diagnosis not present

## 2019-06-28 DIAGNOSIS — C61 Malignant neoplasm of prostate: Secondary | ICD-10-CM | POA: Diagnosis not present

## 2019-07-09 ENCOUNTER — Encounter: Payer: Self-pay | Admitting: Family Medicine

## 2019-07-09 ENCOUNTER — Other Ambulatory Visit: Payer: Self-pay

## 2019-07-09 ENCOUNTER — Ambulatory Visit (INDEPENDENT_AMBULATORY_CARE_PROVIDER_SITE_OTHER): Payer: Medicare Other | Admitting: Family Medicine

## 2019-07-09 VITALS — BP 140/70 | HR 64 | Temp 98.3°F | Wt 239.1 lb

## 2019-07-09 DIAGNOSIS — M25561 Pain in right knee: Secondary | ICD-10-CM | POA: Diagnosis not present

## 2019-07-09 NOTE — Progress Notes (Signed)
   Subjective:    Patient ID: Bruce Ball, male    DOB: [birth year], 73 y.o.   MRN: [id number]  HPI Here for 2 weeks of severe pain in the right knee. No recent trauma. The knee has been slightly swollen, but it has not been red or warm. He is taking some aspirin for it. He has hydrocodone, oxycodone, and Dilaudid at home but he has tried not to use any of this. He has a knee brace at home, but has not used it. He does have a walker and he is using this now. He has a prosthetic right femur but has his native knee joint.    Review of Systems  Constitutional: Negative.   Respiratory: Negative.   Cardiovascular: Negative.   Musculoskeletal: Positive for arthralgias and gait problem.       Objective:   Physical Exam Constitutional:      Comments: In pain, using a rollator walker   Cardiovascular:     Rate and Rhythm: Normal rate and regular rhythm.     Pulses: Normal pulses.     Heart sounds: Normal heart sounds.  Pulmonary:     Effort: Pulmonary effort is normal.     Breath sounds: Normal breath sounds.  Musculoskeletal:     Comments: The right knee is mildly swollen, no erythema or warmth. ROM is full. He is very tender along the lateral joint space and moderately tender along the medial joint space. Negative anterior drawer.   Neurological:     Mental Status: He is alert.           Assessment & Plan:  Right knee pain, he likely has a torn meniscus. He will use the walker for support. I advised him to wear the knee brace he has at home. He can use the pain medications he has if needed. We will refer him to see Orthopedics soon.  Alysia Penna, MD

## 2019-07-14 ENCOUNTER — Other Ambulatory Visit: Payer: Self-pay

## 2019-07-14 ENCOUNTER — Emergency Department (HOSPITAL_BASED_OUTPATIENT_CLINIC_OR_DEPARTMENT_OTHER)
Admission: EM | Admit: 2019-07-14 | Discharge: 2019-07-14 | Disposition: A | Discharge status: Home / Self Care | Payer: Medicare Other | Attending: Emergency Medicine | Admitting: Emergency Medicine

## 2019-07-14 ENCOUNTER — Emergency Department (HOSPITAL_BASED_OUTPATIENT_CLINIC_OR_DEPARTMENT_OTHER): Payer: Medicare Other

## 2019-07-14 ENCOUNTER — Encounter (HOSPITAL_BASED_OUTPATIENT_CLINIC_OR_DEPARTMENT_OTHER): Payer: Self-pay | Admitting: *Deleted

## 2019-07-14 DIAGNOSIS — M25561 Pain in right knee: Secondary | ICD-10-CM | POA: Diagnosis not present

## 2019-07-14 DIAGNOSIS — R52 Pain, unspecified: Secondary | ICD-10-CM

## 2019-07-14 DIAGNOSIS — I10 Essential (primary) hypertension: Secondary | ICD-10-CM | POA: Diagnosis not present

## 2019-07-14 DIAGNOSIS — Z87891 Personal history of nicotine dependence: Secondary | ICD-10-CM | POA: Insufficient documentation

## 2019-07-14 DIAGNOSIS — Z79899 Other long term (current) drug therapy: Secondary | ICD-10-CM | POA: Diagnosis not present

## 2019-07-14 DIAGNOSIS — M1611 Unilateral primary osteoarthritis, right hip: Secondary | ICD-10-CM | POA: Diagnosis not present

## 2019-07-14 DIAGNOSIS — Z7982 Long term (current) use of aspirin: Secondary | ICD-10-CM | POA: Diagnosis not present

## 2019-07-14 NOTE — ED Provider Notes (Signed)
[facility] EMERGENCY DEPARTMENT Provider Note   CSN: [id number] Arrival date & time: 07/14/19  8891     History   Chief Complaint Chief Complaint  Patient presents with  . Knee Pain    HPI Bruce Ball is a 73 y.o. male.  He is complaining of 1 month of right knee pain that is been getting progressively worse.  He saw his PCP about a week ago and they thought it was probably ligamentous or meniscus and he was referred to an orthopedist.  He does not have an appointment until August.  He has been having limitations with ambulation and severe pain when he ambulates.  He says he came here because he cannot wait that long.  He is a prior history of what he calls bone cancer in his right hip that they operated on and gave him a rod down his femur.  This is back in [place] and he has not seen orthopedist since the 1970s for this.     The history is provided by the patient.  Knee Pain Location:  Knee Injury: no   Knee location:  R knee Pain details:    Quality:  Aching, sharp and shooting   Radiates to:  L leg   Severity:  Moderate   Onset quality:  Gradual   Duration:  1 month   Timing:  Constant   Progression:  Worsening Chronicity:  New Relieved by:  Rest Worsened by:  Bearing weight and activity Ineffective treatments:  Elevation Associated symptoms: decreased ROM   Associated symptoms: no fever, no neck pain and no numbness     Past Medical History:  Diagnosis Date  . Chronic back pain greater than 3 months duration   . Chronic leg pain   . Deafness in right ear   . Extrinsic asthma, unspecified 04/07/2009   PT DENIES  . GERD (gastroesophageal reflux disease)   . History of kidney stones   . HYPERTENSION 10/29/2007  . HYPOTENSION 08/13/2010  . LOW BACK PAIN 04/21/2008  . NEPHROLITHIASIS, HX OF 04/21/2008  . PUD, HX OF 04/21/2008  . Renal disorder     Patient Active Problem List   Diagnosis Date Noted  . Gross hematuria 06/14/2019  .  Hyperlipidemia 11/11/2017  . Community acquired pneumonia of right upper lobe of lung ([place]) 11/05/2017  . Right ureteral stone 11/05/2017  . History of total right hip replacement 03/08/2017  . History of prostate cancer 03/08/2017  . 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 High risk medication use 02/28/2017  . Spondylosis of lumbar region without myelopathy or radiculopathy 02/28/2017  . DDD (degenerative disc disease), thoracic 02/28/2017  . Ataxia 05/18/2016  . Vestibular disequilibrium 05/18/2016  . Ankylosing spondylitis ([place]) 05/24/2014  . Lung nodule, solitary 05/24/2014  . Peripheral edema 06/08/2012  . EXTRINSIC ASTHMA, UNSPECIFIED 04/07/2009  . LOW BACK PAIN 04/21/2008  . History of peptic ulcer disease 04/21/2008  . Essential hypertension 10/29/2007    Past Surgical History:  Procedure Laterality Date  . bone cancer  1970   rt femur removed and steel rod placed  . CYSTOSCOPY WITH RETROGRADE PYELOGRAM, URETEROSCOPY AND STENT PLACEMENT Right 11/06/2017   Procedure: CYSTOSCOPY WITH  STENT PLACEMENT RIGHT;  Surgeon: Raynelle Bring, MD;  Location: [facility];  Service: Urology;  Laterality: Right;  . CYSTOSCOPY WITH RETROGRADE PYELOGRAM, URETEROSCOPY AND STENT PLACEMENT Left 06/09/2019   Procedure: CYSTOSCOPY WITH RETROGRADE PYELOGRAM, URETEROSCOPY AND STENT PLACEMENT X2;  Surgeon: [name], MD;  Location: [facility];  Service: Urology;  Laterality: Left;  . CYSTOSCOPY/RETROGRADE/URETEROSCOPY     1 year ago at [place] in [place]  . CYSTOSCOPY/URETEROSCOPY/HOLMIUM LASER Right 11/24/2017   Procedure: CYSTOSCOPY/URETEROSCOPY/ RETROGRADE/STENT REMOVAL;  Surgeon: Raynelle Bring, MD;  Location: [facility];  Service: Urology;  Laterality: Right;  . prosatectomy          Home Medications    Prior to Admission medications   Medication Sig Start Date End Date Taking? Authorizing Provider  amLODipine (NORVASC) 10 MG tablet Take 10 mg at bedtime by mouth.     [provider]   aspirin EC 81 MG tablet Take 81 mg daily by mouth.    [provider]  atorvastatin (LIPITOR) 20 MG tablet Take 20 mg at bedtime by mouth.     [provider]  azithromycin (ZITHROMAX) 250 MG tablet Take two tablets on day 1, then one tablet daily x 4 days 12/15/18   Inda Coke, PA  carvedilol (COREG) 25 MG tablet Take 25 mg 2 (two) times daily by mouth.     [provider]  cephALEXin (KEFLEX) 500 MG capsule Take 1 capsule (500 mg total) by mouth 2 (two) times daily. X 3 days to prevent infection with tethered stents in place. 06/09/19   [name], MD  Cholecalciferol (VITAMIN D) 2000 units CAPS Take 2,000 Units 2 (two) times daily by mouth.     [provider]  HYDROmorphone (DILAUDID) 2 MG tablet Take 1 tablet (2 mg total) by mouth every 4 (four) hours as needed for severe pain. From kidney stone 06/09/19   [name], MD  isosorbide mononitrate (IMDUR) 30 MG 24 hr tablet Take 30 mg at bedtime by mouth.     [provider]  omeprazole (PRILOSEC) 20 MG capsule Take 20 mg by mouth 2 (two) times a day.     [provider]  predniSONE (DELTASONE) 5 MG tablet Take 1 tablet (5 mg total) by mouth daily with breakfast. Resume once you have completed a taper of prednisone as instructed 05/19/16   Ghimire, Henreitta Leber, MD    Family History Family History  Problem Relation Age of Onset  . COPD Father   . Cancer Other        prostate    Social History Social History   Tobacco Use  . Smoking status: Former Smoker    Quit date: 12/30/1978    Years since quitting: 40.5  . Smokeless tobacco: Never Used  Substance Use Topics  . Alcohol use: No  . Drug use: No     Allergies   Patient has no known allergies.   Review of Systems Review of Systems  Constitutional: Negative for fever.  HENT: Negative for sore throat.   Eyes: Negative for visual disturbance.  Respiratory: Negative for shortness of breath.   Cardiovascular:  Negative for chest pain.  Gastrointestinal: Negative for abdominal pain.  Genitourinary: Negative for dysuria.  Musculoskeletal: Positive for gait problem. Negative for neck pain.  Skin: Negative for rash and wound.  Neurological: Negative for headaches.     Physical Exam Updated Vital Signs BP (!) 157/100 (BP Location: Right Arm)   Pulse 67   Temp 98.6 F (37 C) (Oral)   Resp 18   Ht 5\' 11"  (1.803 m)   Wt 108.4 kg   SpO2 97%   BMI 33.33 kg/m   Physical Exam Vitals signs and nursing note reviewed.  Constitutional:      Appearance: He is well-developed.  HENT:  Head: Normocephalic and atraumatic.  Eyes:     Conjunctiva/sclera: Conjunctivae normal.  Neck:     Musculoskeletal: Neck supple.  Pulmonary:     Effort: Pulmonary effort is normal.  Musculoskeletal:        General: Tenderness present.     Right lower leg: No edema.     Left lower leg: No edema.     Comments: RLE-Is a lot of tenderness on the lateral knee joint line and the anterior knee although no patellar tenderness.  There is possibly a mild effusion.  No particular warmth or erythema.  Limited range of motion secondary to pain.  Extensor mechanism intact.  No calf swelling or cords.  No significant hip pain with range of motion.  Left lower extremity full range of motion without any pain or limitations.  Distal pulses and sensation intact in both lower extremities.  Skin:    General: Skin is warm and dry.     Capillary Refill: Capillary refill takes less than 2 seconds.  Neurological:     General: No focal deficit present.     Mental Status: He is alert.     GCS: GCS eye subscore is 4. GCS verbal subscore is 5. GCS motor subscore is 6.     Sensory: No sensory deficit.     Motor: No weakness.     Gait: Gait abnormal (limping).      ED Treatments / Results  Labs (all labs ordered are listed, but only abnormal results are displayed) Labs Reviewed - No data to display  EKG None  Radiology Dg Knee  Complete 4 Views Right  Result Date: 07/14/2019 CLINICAL DATA:  Anterior right knee pain radiating into the upper leg today. No known injury. EXAM: RIGHT KNEE - COMPLETE 4+ VIEW COMPARISON:  None. FINDINGS: No evidence of fracture, dislocation, or joint effusion. Partial visualization of an intramedullary nail in the femur noted. No evidence of arthropathy or other focal bone abnormality. Soft tissues are unremarkable. IMPRESSION: Normal right knee. Intramedullary nail in the right femur is partially imaged. Electronically Signed   By: Inge Rise M.D.   On: 07/14/2019 11:52   Dg Femur, Min 2 Views Right  Result Date: 07/14/2019 CLINICAL DATA:  Right No known injury. Knee and upper leg pain today. EXAM: RIGHT FEMUR 2 VIEWS COMPARISON:  None. FINDINGS: The patient is status post resection of the right hip and proximal diaphysis of the femur with a prosthesis in place. The device is located. Hardware is intact without loosening or complicating feature. No acute bony abnormality is identified. Right hip joint space narrowing is noted. IMPRESSION: No acute abnormality. Right hip joint space narrowing the prosthesis is in place. Electronically Signed   By: Inge Rise M.D.   On: 07/14/2019 11:54    Procedures Procedures (including critical care time)  Medications Ordered in ED Medications - No data to display   Initial Impression / Assessment and Plan / ED Course  I have reviewed the triage vital signs and the nursing notes.  Pertinent labs & imaging results that were available during my care of the patient were reviewed by me and considered in my medical decision making (see chart for details).  Clinical Course as of Jul 14 1034  Wed Jul 14, 2019  1259 Patient got his knee x-ray and no signs of any fracture and hardware appears intact.  He is got a knee immobilizer with him now.  He does not want any medication for pain.  I put  a call into the sports medicine office upstairs but they are  at lunch and I told the patient I will close the loop with him and see if they can get him to reach out for an appointment for him.   [MB]    Clinical Course User Index [MB] Hayden Rasmussen, MD        Final Clinical Impressions(s) / ED Diagnoses   Final diagnoses:  Acute pain of right knee    ED Discharge Orders    None       Hayden Rasmussen, MD 07/15/19 1036

## 2019-07-14 NOTE — ED Triage Notes (Signed)
Rt knee pain x 1 month states getting worse  Denies inj  Was seen by pcp last week for same

## 2019-07-14 NOTE — Discharge Instructions (Addendum)
He was seen in the emergency department for continued right knee pain.  Your x-rays do not show any fractures in your hardware is in good position.  We are giving you a knee brace to help support the area and will try to get you followed up with orthopedics sooner than your current appointment in August.  Please return if any worsening symptoms.

## 2019-07-15 ENCOUNTER — Encounter: Payer: Self-pay | Admitting: Family Medicine

## 2019-07-15 ENCOUNTER — Ambulatory Visit (INDEPENDENT_AMBULATORY_CARE_PROVIDER_SITE_OTHER): Payer: Medicare Other | Admitting: Family Medicine

## 2019-07-15 ENCOUNTER — Ambulatory Visit: Payer: Self-pay

## 2019-07-15 VITALS — BP 139/82 | HR 71 | Ht 71.0 in | Wt 239.0 lb

## 2019-07-15 DIAGNOSIS — M25561 Pain in right knee: Secondary | ICD-10-CM | POA: Diagnosis not present

## 2019-07-15 DIAGNOSIS — S72413A Displaced unspecified condyle fracture of lower end of unspecified femur, initial encounter for closed fracture: Secondary | ICD-10-CM

## 2019-07-15 HISTORY — DX: Displaced unspecified condyle fracture of lower end of unspecified femur, initial encounter for closed fracture: S72.413A

## 2019-07-15 MED ORDER — TRIAMCINOLONE ACETONIDE 40 MG/ML IJ SUSP
40.0000 mg | Freq: Once | INTRAMUSCULAR | Status: AC
  Administered 2019-07-15: 40 mg via INTRA_ARTICULAR

## 2019-07-15 NOTE — Patient Instructions (Signed)
Nice to meet you Please try Voltaren. It is an over the counter rub on medicine  Please try the exercises  Please try ice  Please send me a message in MyChart with any questions or updates.  Please see me back in 4 weeks.  Please let me know if the steroid wears off and we can try gel injections.   --Dr. Raeford Razor

## 2019-07-15 NOTE — Assessment & Plan Note (Signed)
Having superior pain throughout the knee itself.  X-ray was not showing significant degenerative changes.  Could be more chondromalacia or patellofemoral syndrome as a source of his pain.  Unclear if it is referred pain from the right hip.  Has a mild limb length discrepancy with the right being longer than the left. - injection  - counseled HEP and supportive  - could consider PT or gel injections. Consider MRI.

## 2019-07-15 NOTE — Progress Notes (Signed)
Bruce Ball - 73 y.o. male MRN [id number]  Date of birth: 03-11-1946  SUBJECTIVE:  Including CC & ROS.  Chief Complaint  Patient presents with  . Knee Pain    right knee x 1 month    Bruce Ball is a 72 y.o. male that is presenting with acute on chronic right knee pain.  The pain is severe and debilitating at times.  Is been occurring for 1 month.  It is intermittent in nature.  He feels the pain over the anterior lateral aspect of the knee.  He denies any inciting event or trauma.  He has not had any improvement modalities to date.  He has a history of a experimental right hip replacement related to cancer from 1970s.  He is walking with a Rollator today and normally uses a scooter.  No redness of the knee.  Independent review of the right knee x-ray from 7/15 shows no significant degenerative changes.  Independent review of the right hip x-ray shows postsurgical device with intramedullary nail.  Surgical hardware appears to be in place.   Review of Systems  Constitutional: Negative for fever.  HENT: Negative for congestion.   Respiratory: Negative for cough.   Cardiovascular: Negative for chest pain.  Gastrointestinal: Negative for abdominal pain.  Musculoskeletal: Positive for arthralgias and gait problem.  Skin: Negative for color change.  Neurological: Negative for weakness.  Hematological: Negative for adenopathy.    HISTORY: Past Medical, Surgical, Social, and Family History Reviewed & Updated per EMR.   Pertinent Historical Findings include:  Past Medical History:  Diagnosis Date  . Chronic back pain greater than 3 months duration   . Chronic leg pain   . Deafness in right ear   . Extrinsic asthma, unspecified 04/07/2009   PT DENIES  . GERD (gastroesophageal reflux disease)   . History of kidney stones   . HYPERTENSION 10/29/2007  . HYPOTENSION 08/13/2010  . LOW BACK PAIN 04/21/2008  . NEPHROLITHIASIS, HX OF 04/21/2008  . PUD, HX OF 04/21/2008  . Renal disorder      Past Surgical History:  Procedure Laterality Date  . bone cancer  1970   rt femur removed and steel rod placed  . CYSTOSCOPY WITH RETROGRADE PYELOGRAM, URETEROSCOPY AND STENT PLACEMENT Right 11/06/2017   Procedure: CYSTOSCOPY WITH  STENT PLACEMENT RIGHT;  Surgeon: Raynelle Bring, MD;  Location: [facility];  Service: Urology;  Laterality: Right;  . CYSTOSCOPY WITH RETROGRADE PYELOGRAM, URETEROSCOPY AND STENT PLACEMENT Left 06/09/2019   Procedure: CYSTOSCOPY WITH RETROGRADE PYELOGRAM, URETEROSCOPY AND STENT PLACEMENT X2;  Surgeon: [name], MD;  Location: [facility];  Service: Urology;  Laterality: Left;  . CYSTOSCOPY/RETROGRADE/URETEROSCOPY     1 year ago at [facility] in [place]  . CYSTOSCOPY/URETEROSCOPY/HOLMIUM LASER Right 11/24/2017   Procedure: CYSTOSCOPY/URETEROSCOPY/ RETROGRADE/STENT REMOVAL;  Surgeon: Raynelle Bring, MD;  Location: [facility];  Service: Urology;  Laterality: Right;  . prosatectomy      No Known Allergies  Family History  Problem Relation Age of Onset  . COPD Father   . Cancer Other        prostate     Social History   Socioeconomic History  . Marital status: Married    Spouse name: Not on file  . Number of children: Not on file  . Years of education: Not on file  . Highest education level: Not on file  Occupational History  . Occupation: Optometrist  Social Needs  . Financial resource strain: Not on file  . Food insecurity  Worry: Not on file    Inability: Not on file  . Transportation needs    Medical: Not on file    Non-medical: Not on file  Tobacco Use  . Smoking status: Former Smoker    Quit date: 12/30/1978    Years since quitting: 40.5  . Smokeless tobacco: Never Used  Substance and Sexual Activity  . Alcohol use: No  . Drug use: No  . Sexual activity: Not on file  Lifestyle  . Physical activity    Days per week: Not on file    Minutes per session: Not on file  . Stress: Not on file  Relationships  . Social Herbalist on  phone: Not on file    Gets together: Not on file    Attends religious service: Not on file    Active member of club or organization: Not on file    Attends meetings of clubs or organizations: Not on file    Relationship status: Not on file  . Intimate partner violence    Fear of current or ex partner: Not on file    Emotionally abused: Not on file    Physically abused: Not on file    Forced sexual activity: Not on file  Other Topics Concern  . Not on file  Social History Narrative  . Not on file     PHYSICAL EXAM:  VS: BP 139/82   Pulse 71   Ht 5\' 11"  (1.803 m)   Wt 239 lb (108.4 kg)   BMI 33.33 kg/m  Physical Exam Gen: NAD, alert, cooperative with exam, well-appearing ENT: normal lips, normal nasal mucosa,  Eye: normal EOM, normal conjunctiva and lids CV:  no edema, +2 pedal pulses   Resp: no accessory muscle use, non-labored,  Skin: no rashes, no areas of induration  Neuro: normal tone, normal sensation to touch Psych:  normal insight, alert and oriented MSK:  Right knee: No obvious effusion. Tenderness to palpation of the lateral joint line. Normal flexion extension. Normal strength resistance. No instability. Some pain with patellar grind. Negative McMurray's test. Neurovascular intact   Aspiration/Injection Procedure Note Bruce Ball January 19, 1946  Procedure: Injection Indications: Right knee pain  Procedure Details Consent: Risks of procedure as well as the alternatives and risks of each were explained to the (patient/caregiver).  Consent for procedure obtained. Time Out: Verified patient identification, verified procedure, site/side was marked, verified correct patient position, special equipment/implants available, medications/allergies/relevent history reviewed, required imaging and test results available.  Performed.  The area was cleaned with iodine and alcohol swabs.    The right knee superior lateral suprapatellar pouch was injected using 1 cc's  of 40 mg Kenalog and 4 cc's of 0.25% bupivacaine with a 22 1 1/2" needle.  Ultrasound was used. Images were obtained in long views showing the injection.     A sterile dressing was applied.  Patient did tolerate procedure well.     ASSESSMENT & PLAN:   Acute pain of right knee Having superior pain throughout the knee itself.  X-ray was not showing significant degenerative changes.  Could be more chondromalacia or patellofemoral syndrome as a source of his pain.  Unclear if it is referred pain from the right hip.  Has a mild limb length discrepancy with the right being longer than the left. - injection  - counseled HEP and supportive  - could consider PT or gel injections. Consider MRI.

## 2019-08-10 DIAGNOSIS — N202 Calculus of kidney with calculus of ureter: Secondary | ICD-10-CM | POA: Diagnosis not present

## 2019-08-10 DIAGNOSIS — C61 Malignant neoplasm of prostate: Secondary | ICD-10-CM | POA: Diagnosis not present

## 2019-08-10 DIAGNOSIS — N3945 Continuous leakage: Secondary | ICD-10-CM | POA: Diagnosis not present

## 2019-08-12 ENCOUNTER — Other Ambulatory Visit: Payer: Self-pay

## 2019-08-12 ENCOUNTER — Ambulatory Visit (INDEPENDENT_AMBULATORY_CARE_PROVIDER_SITE_OTHER): Payer: Medicare Other | Admitting: Family Medicine

## 2019-08-12 DIAGNOSIS — M25561 Pain in right knee: Secondary | ICD-10-CM

## 2019-08-12 NOTE — Progress Notes (Signed)
Bruce Ball - 73 y.o. male MRN [id number]  Date of birth: [birth year]  SUBJECTIVE:  Including CC & ROS.  No chief complaint on file.   Bruce Ball is a 73 y.o. male that is following up in regards to his right knee pain.  He received an injection on 7/16.  At that time he had complete resolution of his knee pain.  His pain has completely diminished.  He does have a slight twinge every now and then but that was there prior to the significant knee pain.  He is not having any mechanical symptoms.  Denies any swelling.    Review of Systems  Constitutional: Negative for fever.  HENT: Negative for congestion.   Respiratory: Negative for cough.   Cardiovascular: Negative for chest pain.  Gastrointestinal: Negative for abdominal distention.  Musculoskeletal: Positive for gait problem.  Skin: Negative for color change.  Neurological: Negative for weakness.  Hematological: Negative for adenopathy.    HISTORY: Past Medical, Surgical, Social, and Family History Reviewed & Updated per EMR.   Pertinent Historical Findings include:  Past Medical History:  Diagnosis Date  . Chronic back pain greater than 3 months duration   . Chronic leg pain   . Deafness in right ear   . Extrinsic asthma, unspecified 04/07/2009   PT DENIES  . GERD (gastroesophageal reflux disease)   . History of kidney stones   . HYPERTENSION 10/29/2007  . HYPOTENSION 08/13/2010  . LOW BACK PAIN 04/21/2008  . NEPHROLITHIASIS, HX OF 04/21/2008  . PUD, HX OF 04/21/2008  . Renal disorder     Past Surgical History:  Procedure Laterality Date  . bone cancer  1970   rt femur removed and steel rod placed  . CYSTOSCOPY WITH RETROGRADE PYELOGRAM, URETEROSCOPY AND STENT PLACEMENT Right 11/06/2017   Procedure: CYSTOSCOPY WITH  STENT PLACEMENT RIGHT;  Surgeon: Raynelle Bring, MD;  Location: [facility];  Service: Urology;  Laterality: Right;  . CYSTOSCOPY WITH RETROGRADE PYELOGRAM, URETEROSCOPY AND STENT PLACEMENT Left 06/09/2019   Procedure: CYSTOSCOPY WITH RETROGRADE PYELOGRAM, URETEROSCOPY AND STENT PLACEMENT X2;  Surgeon: [name], MD;  Location: [facility];  Service: Urology;  Laterality: Left;  . CYSTOSCOPY/RETROGRADE/URETEROSCOPY     1 year ago at [place] in [place]  . CYSTOSCOPY/URETEROSCOPY/HOLMIUM LASER Right 11/24/2017   Procedure: CYSTOSCOPY/URETEROSCOPY/ RETROGRADE/STENT REMOVAL;  Surgeon: Raynelle Bring, MD;  Location: [facility];  Service: Urology;  Laterality: Right;  . prosatectomy      No Known Allergies  Family History  Problem Relation Age of Onset  . COPD Father   . Cancer Other        prostate     Social History   Socioeconomic History  . Marital status: Married    Spouse name: Not on file  . Number of children: Not on file  . Years of education: Not on file  . Highest education level: Not on file  Occupational History  . Occupation: Optometrist  Social Needs  . Financial resource strain: Not on file  . Food insecurity    Worry: Not on file    Inability: Not on file  . Transportation needs    Medical: Not on file    Non-medical: Not on file  Tobacco Use  . Smoking status: Former Smoker    Quit date: 12/30/1978    Years since quitting: 40.6  . Smokeless tobacco: Never Used  Substance and Sexual Activity  . Alcohol use: No  . Drug use: No  . Sexual activity: Not on file  Lifestyle  . Physical activity    Days per week: Not on file    Minutes per session: Not on file  . Stress: Not on file  Relationships  . Social Herbalist on phone: Not on file    Gets together: Not on file    Attends religious service: Not on file    Active member of club or organization: Not on file    Attends meetings of clubs or organizations: Not on file    Relationship status: Not on file  . Intimate partner violence    Fear of current or ex partner: Not on file    Emotionally abused: Not on file    Physically abused: Not on file    Forced sexual activity: Not on file  Other Topics  Concern  . Not on file  Social History Narrative  . Not on file     PHYSICAL EXAM:  VS: BP (!) 169/88   Ht 5\' 11"  (1.803 m)   Wt 239 lb (108.4 kg)   BMI 33.33 kg/m  Physical Exam Gen: NAD, alert, cooperative with exam, well-appearing ENT: normal lips, normal nasal mucosa,  Eye: normal EOM, normal conjunctiva and lids CV:  no edema, +2 pedal pulses   Resp: no accessory muscle use, non-labored,  Skin: no rashes, no areas of induration  Neuro: normal tone, normal sensation to touch Psych:  normal insight, alert and oriented MSK:  Right knee: No redness or swelling. Normal range of motion. Normal strength resistance. Slight outward swing on the right with ambulation. Neurovascular intact     ASSESSMENT & PLAN:   Acute pain of right knee Has had resolution of his knee pain.  Possibly related to an acute exacerbation of any degenerative changes. -Counseled on home exercise therapy and supportive care. -Could consider physical therapy in the future. -Counseled on further injections.

## 2019-08-12 NOTE — Assessment & Plan Note (Signed)
Has had resolution of his knee pain.  Possibly related to an acute exacerbation of any degenerative changes. -Counseled on home exercise therapy and supportive care. -Could consider physical therapy in the future. -Counseled on further injections.

## 2019-08-12 NOTE — Patient Instructions (Signed)
Good to see you Please use ice if needed  Please try the exercises  Please try a recumbent bike   Please send me a message in MyChart with any questions or updates.  Please see me back as needed.   --Dr. Raeford Razor

## 2019-10-13 ENCOUNTER — Telehealth: Payer: Self-pay | Admitting: Family Medicine

## 2019-10-13 NOTE — Telephone Encounter (Signed)
I left a message asking the patient to call and schedule Medicare AWV-Initial with Loma Sousa ([place]).  If patient calls back, please schedule Medicare Wellness Visit at next available opening.  VDM (Dee-Dee)

## 2019-10-22 ENCOUNTER — Other Ambulatory Visit: Payer: Self-pay | Admitting: Family Medicine

## 2019-10-22 ENCOUNTER — Other Ambulatory Visit: Payer: Self-pay

## 2019-10-22 ENCOUNTER — Ambulatory Visit (INDEPENDENT_AMBULATORY_CARE_PROVIDER_SITE_OTHER): Payer: Medicare Other | Admitting: Family Medicine

## 2019-10-22 ENCOUNTER — Ambulatory Visit (INDEPENDENT_AMBULATORY_CARE_PROVIDER_SITE_OTHER): Payer: Medicare Other

## 2019-10-22 ENCOUNTER — Encounter: Payer: Self-pay | Admitting: Family Medicine

## 2019-10-22 VITALS — BP 110/78 | HR 66 | Temp 98.2°F | Ht 71.0 in | Wt 245.4 lb

## 2019-10-22 DIAGNOSIS — M545 Low back pain, unspecified: Secondary | ICD-10-CM

## 2019-10-22 DIAGNOSIS — M25551 Pain in right hip: Secondary | ICD-10-CM

## 2019-10-22 DIAGNOSIS — M25511 Pain in right shoulder: Secondary | ICD-10-CM | POA: Diagnosis not present

## 2019-10-22 DIAGNOSIS — M25562 Pain in left knee: Secondary | ICD-10-CM | POA: Diagnosis not present

## 2019-10-22 DIAGNOSIS — M25552 Pain in left hip: Secondary | ICD-10-CM | POA: Diagnosis not present

## 2019-10-22 MED ORDER — KETOROLAC TROMETHAMINE 60 MG/2ML IM SOLN
60.0000 mg | Freq: Once | INTRAMUSCULAR | Status: AC
  Administered 2019-10-22: 60 mg via INTRAMUSCULAR

## 2019-10-22 MED ORDER — METHYLPREDNISOLONE ACETATE 80 MG/ML IJ SUSP
80.0000 mg | Freq: Once | INTRAMUSCULAR | Status: AC
  Administered 2019-10-22: 80 mg via INTRAMUSCULAR

## 2019-10-22 MED ORDER — MELOXICAM 15 MG PO TABS: 15.0000 mg | ORAL_TABLET | Freq: Every day | ORAL | 0 refills | Status: DC

## 2019-10-22 NOTE — Progress Notes (Signed)
   Chief Complaint:  DEMARCUS KINDIG is a 73 y.o. male who presents today with a chief complaint of hip pain.   Assessment/Plan:  Right hip pain No red flags though has history of hip replacement.  Will check plain film today to rule out dislodgment.  Will give 80 mg of Depo-Medrol 60 mg of Toradol today.  Also start Mobic 15 mg daily for the next 1 to 2 weeks tomorrow.  Discussed home exercises.  Right shoulder pain No signs of fracture or dislocation.  May have mild rotator cuff tendinopathy.  Should have improvement with above treatments.  Low back pain/left hip pain/bilateral knee pain No red flags concerning for fracture or dislocation.  No signs of neurologic impingement.  Likely due to strain and/or contusion from recent fall.  Should have improvement with above treatments.  Fall  Seems to be mechanical in nature.  Does not need further work-up at this time.    Subjective:  HPI:  Patient fell about a week ago.  States that he was walking to his car when his foot became tangled up and he landed onto his left side.  Patient landed on the grass.  Since then he has had persistent pain in his left lower back, left knee, left leg, right hip, right knee, and right shoulder.  He has been able to walk however this reproduces his pain.  Pain is predominantly in his right hip at the current time.  He has tried taking Advil and Tylenol with no significant improvement.  No other treatments tried.  No reported bowel or bladder incontinence.  ROS: Per HPI  PMH: He reports that he quit smoking about 40 years ago. He has never used smokeless tobacco. He reports that he does not drink alcohol or use drugs.      Objective:  Physical Exam: BP 110/78   Pulse 66   Temp 98.2 F (36.8 C)   Ht 5\' 11"  (1.803 m)   Wt 245 lb 6.1 oz (111.3 kg)   SpO2 97%   BMI 34.22 kg/m   Gen: NAD, resting comfortably MSK: -Right shoulder: No deformities.  Tender to palpation along biceps tendon.  Normal strength  with supraspinatus, internal rotation, and external rotation.  Positive Hawkins test.  Neurovascular intact distally -Right hip: No deformities.  Tender to palpation along lateral aspect.  Pain with internal and external rotation. -Right knee: No deformities.  Full range motion throughout.  Tender to palpation along joint line.  Neurovascular intact distally - Left hip: No deformities.  Mildly tender to palpation along lateral joint.  No pain with internal or external rotation -Left knee: No deformities.  Full range of motion.  No joint line tenderness. -Back: No deformities.  No ecchymoses.  Mildly tender location along left lower lumbar paraspinals. -Gait: Antalgic gait noted.  Able to bear weight on both lower extremities.  Walks without assistive device.     Algis Greenhouse. Jerline Pain, MD 10/22/2019 2:16 PM

## 2019-10-22 NOTE — Progress Notes (Signed)
Please inform patient of the following:  Xray shows no fractures or displacement. Would for him to follow up here if no improvement in symptoms.  Algis Greenhouse. Jerline Pain, MD 10/22/2019 4:20 PM

## 2019-10-22 NOTE — Patient Instructions (Signed)
It was very nice to see you today!  We will give you 2 injections today.  Tomorrow please start the Mobic.  We will get an x-ray today.  Please let me know or let Dr. Yong Channel know if your pain does not improve over the next few days.  Take care, Dr Jerline Pain  Please try these tips to maintain a healthy lifestyle:   Eat at least 3 REAL meals and 1-2 snacks per day.  Aim for no more than 5 hours between eating.  If you eat breakfast, please do so within one hour of getting up.    Obtain twice as many fruits/vegetables as protein or carbohydrate foods for both lunch and dinner. (Half of each meal should be fruits/vegetables, one quarter protein, and one quarter starchy carbs)   Cut down on sweet beverages. This includes juice, soda, and sweet tea.    Exercise at least 150 minutes every week.

## 2019-11-03 DIAGNOSIS — Z9889 Other specified postprocedural states: Secondary | ICD-10-CM | POA: Diagnosis not present

## 2019-11-03 DIAGNOSIS — M47816 Spondylosis without myelopathy or radiculopathy, lumbar region: Secondary | ICD-10-CM | POA: Diagnosis not present

## 2019-11-03 DIAGNOSIS — M5136 Other intervertebral disc degeneration, lumbar region: Secondary | ICD-10-CM | POA: Diagnosis not present

## 2019-11-03 DIAGNOSIS — R292 Abnormal reflex: Secondary | ICD-10-CM | POA: Diagnosis not present

## 2019-11-03 DIAGNOSIS — M5416 Radiculopathy, lumbar region: Secondary | ICD-10-CM | POA: Diagnosis not present

## 2019-11-11 DIAGNOSIS — M4802 Spinal stenosis, cervical region: Secondary | ICD-10-CM | POA: Diagnosis not present

## 2019-11-11 DIAGNOSIS — M5416 Radiculopathy, lumbar region: Secondary | ICD-10-CM | POA: Diagnosis not present

## 2019-11-11 DIAGNOSIS — M50223 Other cervical disc displacement at C6-C7 level: Secondary | ICD-10-CM | POA: Diagnosis not present

## 2019-11-11 DIAGNOSIS — M5127 Other intervertebral disc displacement, lumbosacral region: Secondary | ICD-10-CM | POA: Diagnosis not present

## 2019-11-11 DIAGNOSIS — M4807 Spinal stenosis, lumbosacral region: Secondary | ICD-10-CM | POA: Diagnosis not present

## 2019-11-11 DIAGNOSIS — R292 Abnormal reflex: Secondary | ICD-10-CM | POA: Diagnosis not present

## 2019-11-14 ENCOUNTER — Telehealth: Payer: Self-pay | Admitting: Family Medicine

## 2019-11-15 DIAGNOSIS — M5136 Other intervertebral disc degeneration, lumbar region: Secondary | ICD-10-CM | POA: Diagnosis not present

## 2019-11-15 DIAGNOSIS — M5416 Radiculopathy, lumbar region: Secondary | ICD-10-CM | POA: Diagnosis not present

## 2019-11-15 DIAGNOSIS — M48062 Spinal stenosis, lumbar region with neurogenic claudication: Secondary | ICD-10-CM | POA: Diagnosis not present

## 2019-11-15 DIAGNOSIS — M47816 Spondylosis without myelopathy or radiculopathy, lumbar region: Secondary | ICD-10-CM | POA: Diagnosis not present

## 2019-11-15 DIAGNOSIS — Z9889 Other specified postprocedural states: Secondary | ICD-10-CM | POA: Diagnosis not present

## 2019-11-16 ENCOUNTER — Emergency Department (HOSPITAL_COMMUNITY): Payer: Medicare Other

## 2019-11-16 ENCOUNTER — Encounter (HOSPITAL_COMMUNITY): Payer: Self-pay

## 2019-11-16 ENCOUNTER — Other Ambulatory Visit: Payer: Self-pay

## 2019-11-16 ENCOUNTER — Ambulatory Visit: Payer: Self-pay | Admitting: Family Medicine

## 2019-11-16 ENCOUNTER — Emergency Department (HOSPITAL_COMMUNITY)
Admission: EM | Admit: 2019-11-16 | Discharge: 2019-11-16 | Disposition: A | Discharge status: Home / Self Care | Payer: Medicare Other | Attending: Emergency Medicine | Admitting: Emergency Medicine

## 2019-11-16 DIAGNOSIS — M546 Pain in thoracic spine: Secondary | ICD-10-CM | POA: Diagnosis not present

## 2019-11-16 DIAGNOSIS — H538 Other visual disturbances: Secondary | ICD-10-CM | POA: Diagnosis not present

## 2019-11-16 DIAGNOSIS — Z7982 Long term (current) use of aspirin: Secondary | ICD-10-CM | POA: Diagnosis not present

## 2019-11-16 DIAGNOSIS — W19XXXA Unspecified fall, initial encounter: Secondary | ICD-10-CM | POA: Insufficient documentation

## 2019-11-16 DIAGNOSIS — R519 Headache, unspecified: Secondary | ICD-10-CM | POA: Insufficient documentation

## 2019-11-16 DIAGNOSIS — Y998 Other external cause status: Secondary | ICD-10-CM | POA: Diagnosis not present

## 2019-11-16 DIAGNOSIS — S299XXA Unspecified injury of thorax, initial encounter: Secondary | ICD-10-CM | POA: Diagnosis not present

## 2019-11-16 DIAGNOSIS — Z79899 Other long term (current) drug therapy: Secondary | ICD-10-CM | POA: Insufficient documentation

## 2019-11-16 DIAGNOSIS — Z791 Long term (current) use of non-steroidal anti-inflammatories (NSAID): Secondary | ICD-10-CM | POA: Insufficient documentation

## 2019-11-16 DIAGNOSIS — S0990XA Unspecified injury of head, initial encounter: Secondary | ICD-10-CM | POA: Diagnosis not present

## 2019-11-16 DIAGNOSIS — M545 Low back pain: Secondary | ICD-10-CM | POA: Diagnosis not present

## 2019-11-16 DIAGNOSIS — R42 Dizziness and giddiness: Secondary | ICD-10-CM

## 2019-11-16 DIAGNOSIS — K219 Gastro-esophageal reflux disease without esophagitis: Secondary | ICD-10-CM | POA: Insufficient documentation

## 2019-11-16 DIAGNOSIS — I6789 Other cerebrovascular disease: Secondary | ICD-10-CM | POA: Diagnosis not present

## 2019-11-16 DIAGNOSIS — Y9389 Activity, other specified: Secondary | ICD-10-CM | POA: Insufficient documentation

## 2019-11-16 DIAGNOSIS — Z87442 Personal history of urinary calculi: Secondary | ICD-10-CM | POA: Diagnosis not present

## 2019-11-16 DIAGNOSIS — I10 Essential (primary) hypertension: Secondary | ICD-10-CM | POA: Insufficient documentation

## 2019-11-16 DIAGNOSIS — S3992XA Unspecified injury of lower back, initial encounter: Secondary | ICD-10-CM | POA: Diagnosis not present

## 2019-11-16 DIAGNOSIS — E785 Hyperlipidemia, unspecified: Secondary | ICD-10-CM | POA: Insufficient documentation

## 2019-11-16 DIAGNOSIS — Z87891 Personal history of nicotine dependence: Secondary | ICD-10-CM | POA: Diagnosis not present

## 2019-11-16 DIAGNOSIS — Y929 Unspecified place or not applicable: Secondary | ICD-10-CM | POA: Insufficient documentation

## 2019-11-16 LAB — PROTIME-INR
INR: 1.1 (ref 0.8–1.2)
Prothrombin Time: 13.6 seconds (ref 11.4–15.2)

## 2019-11-16 LAB — CBC
HCT: 48.8 % (ref 39.0–52.0)
Hemoglobin: 15.7 g/dL (ref 13.0–17.0)
MCH: 28.8 pg (ref 26.0–34.0)
MCHC: 32.2 g/dL (ref 30.0–36.0)
MCV: 89.5 fL (ref 80.0–100.0)
Platelets: 179 10*3/uL (ref 150–400)
RBC: 5.45 MIL/uL (ref 4.22–5.81)
RDW: 13.9 % (ref 11.5–15.5)
WBC: 9.3 10*3/uL (ref 4.0–10.5)
nRBC: 0 % (ref 0.0–0.2)

## 2019-11-16 LAB — I-STAT CHEM 8, ED
BUN: 23 mg/dL (ref 8–23)
Calcium, Ion: 1.17 mmol/L (ref 1.15–1.40)
Chloride: 106 mmol/L (ref 98–111)
Creatinine, Ser: 1.3 mg/dL — ABNORMAL HIGH (ref 0.61–1.24)
Glucose, Bld: 146 mg/dL — ABNORMAL HIGH (ref 70–99)
HCT: 43 % (ref 39.0–52.0)
Hemoglobin: 14.6 g/dL (ref 13.0–17.0)
Potassium: 4.1 mmol/L (ref 3.5–5.1)
Sodium: 140 mmol/L (ref 135–145)
TCO2: 22 mmol/L (ref 22–32)

## 2019-11-16 LAB — RAPID URINE DRUG SCREEN, HOSP PERFORMED
Amphetamines: NOT DETECTED
Barbiturates: NOT DETECTED
Benzodiazepines: NOT DETECTED
Cocaine: NOT DETECTED
Opiates: NOT DETECTED
Tetrahydrocannabinol: NOT DETECTED

## 2019-11-16 LAB — URINALYSIS, ROUTINE W REFLEX MICROSCOPIC
Bacteria, UA: NONE SEEN
Bilirubin Urine: NEGATIVE
Glucose, UA: NEGATIVE mg/dL
Ketones, ur: NEGATIVE mg/dL
Leukocytes,Ua: NEGATIVE
Nitrite: NEGATIVE
Protein, ur: NEGATIVE mg/dL
Specific Gravity, Urine: 1.025 (ref 1.005–1.030)
pH: 5 (ref 5.0–8.0)

## 2019-11-16 LAB — COMPREHENSIVE METABOLIC PANEL
ALT: 25 U/L (ref 0–44)
AST: 21 U/L (ref 15–41)
Albumin: 3.9 g/dL (ref 3.5–5.0)
Alkaline Phosphatase: 59 U/L (ref 38–126)
Anion gap: 9 (ref 5–15)
BUN: 25 mg/dL — ABNORMAL HIGH (ref 8–23)
CO2: 23 mmol/L (ref 22–32)
Calcium: 8.6 mg/dL — ABNORMAL LOW (ref 8.9–10.3)
Chloride: 107 mmol/L (ref 98–111)
Creatinine, Ser: 1.38 mg/dL — ABNORMAL HIGH (ref 0.61–1.24)
GFR calc Af Amer: 58 mL/min — ABNORMAL LOW (ref >60)
GFR calc non Af Amer: 50 mL/min — ABNORMAL LOW (ref >60)
Glucose, Bld: 145 mg/dL — ABNORMAL HIGH (ref 70–99)
Potassium: 4 mmol/L (ref 3.5–5.1)
Sodium: 139 mmol/L (ref 135–145)
Total Bilirubin: 1.2 mg/dL (ref 0.3–1.2)
Total Protein: 6.7 g/dL (ref 6.5–8.1)

## 2019-11-16 LAB — ETHANOL: Alcohol, Ethyl (B): <10 mg/dL (ref <10)

## 2019-11-16 LAB — DIFFERENTIAL
Abs Immature Granulocytes: 0.04 10*3/uL (ref 0.00–0.07)
Basophils Absolute: 0.1 10*3/uL (ref 0.0–0.1)
Basophils Relative: 1 %
Eosinophils Absolute: 0.1 10*3/uL (ref 0.0–0.5)
Eosinophils Relative: 1 %
Immature Granulocytes: 0 %
Lymphocytes Relative: 10 %
Lymphs Abs: 1 10*3/uL (ref 0.7–4.0)
Monocytes Absolute: 0.5 10*3/uL (ref 0.1–1.0)
Monocytes Relative: 5 %
Neutro Abs: 7.7 10*3/uL (ref 1.7–7.7)
Neutrophils Relative %: 83 %

## 2019-11-16 LAB — APTT: aPTT: 24 seconds (ref 24–36)

## 2019-11-16 MED ORDER — METOCLOPRAMIDE HCL 5 MG/ML IJ SOLN
10.0000 mg | Freq: Once | INTRAMUSCULAR | Status: AC
  Administered 2019-11-16: 10 mg via INTRAVENOUS
  Filled 2019-11-16: qty 2

## 2019-11-16 MED ORDER — SODIUM CHLORIDE 0.9 % IV BOLUS
500.0000 mL | Freq: Once | INTRAVENOUS | Status: AC
  Administered 2019-11-16: 500 mL via INTRAVENOUS

## 2019-11-16 NOTE — Telephone Encounter (Signed)
FYI

## 2019-11-16 NOTE — ED Notes (Signed)
Patient ambulated to the restroom with minimal assistance 

## 2019-11-16 NOTE — ED Notes (Signed)
Pt transported to CT at this time.

## 2019-11-16 NOTE — Telephone Encounter (Signed)
Noted thanks-appropriate disposition

## 2019-11-16 NOTE — Telephone Encounter (Signed)
Pt's wife calling, pt present during call. Reports pt fell 30 minutes prior to triage call. States was walking up stairs and "Fell into table at top of stairs" due to dizziness. Only injuries from fall "Scratches on hand." Pt states onset of dizziness this AM, intermittent. Also reports headache 8/10. Headache last night 10/10; states "Took handful of baby aspirin."  Also reports double vision, present now. Pt directed to ED now, wife states will follow disposition.  Reason for Disposition . SEVERE dizziness (e.g., unable to stand, requires support to walk, feels like passing out now)  Answer Assessment - Initial Assessment Questions 1. DESCRIPTION: "Describe your dizziness."     Very lightheaded 2. LIGHTHEADED: "Do you feel lightheaded?" (e.g., somewhat faint, woozy, weak upon standing)     yes 3. VERTIGO: "Do you feel like either you or the room is spinning or tilting?" (i.e. vertigo)     No 4. SEVERITY: "How bad is it?"  "Do you feel like you are going to faint?" "Can you stand and walk?"   - MILD - walking normally   - MODERATE - interferes with normal activities (e.g., work, school)    - SEVERE - unable to stand, requires support to walk, feels like passing out now.      Severe earlier, resulted in fall 5. ONSET:  "When did the dizziness begin?"    This AM 6. AGGRAVATING FACTORS: "Does anything make it worse?" (e.g., standing, change in head position)     7. HEART RATE: "Can you tell me your heart rate?" "How many beats in 15 seconds?"  (Note: not all patients can do this)       8. CAUSE: "What do you think is causing the dizziness?"      9. RECURRENT SYMPTOM: "Have you had dizziness before?" If so, ask: "When was the last time?" "What happened that time?"      10. OTHER SYMPTOMS: "Do you have any other symptoms?" (e.g., fever, chest pain, vomiting, diarrhea, bleeding)       Headache 8/10, last night 10/10. Double vision.  Protocols used: DIZZINESS [facility]

## 2019-11-16 NOTE — ED Notes (Signed)
Patient transported to MRI 

## 2019-11-16 NOTE — ED Triage Notes (Signed)
Dr. Ralene Bathe to bedside. Per MD no CODE stroke. Pt reports headache for 1.5 days with dizziness today that resulted in a fall. Pt reports blurred vision after the fall. Per family: pt more confused than normal.

## 2019-11-16 NOTE — Telephone Encounter (Signed)
See note

## 2019-11-16 NOTE — ED Provider Notes (Signed)
[facility] DEPT Provider Note   CSN: [id number] Arrival date & time: 11/16/19  1606     History   Chief Complaint Chief Complaint  Patient presents with   Headache   Dizziness    HPI LINC FLAMENCO is a 73 y.o. male.     The history is provided by the patient, medical records and the spouse. No language interpreter was used.  Headache Associated symptoms: dizziness   Dizziness Associated symptoms: headaches    CLOYD VOLCY is a 73 y.o. male who presents to the Emergency Department complaining of headache and dizziness. He presents to the emergency department accompanied by his wife for evaluation of several days of intermittent left frontal headache that is waxing and waning with associated dizziness. Dizziness also waxes and wanes with no clear alleviating or worsening factors. He felt dizzy enough today that he fell. Since the fall he has been experiencing intermittent double vision. Denies any fevers, nausea, vomiting, chest pain, shortness of breath. Symptoms are moderate, waxing and waning and worsening. Past Medical History:  Diagnosis Date   Chronic back pain greater than 3 months duration    Chronic leg pain    Deafness in right ear    Extrinsic asthma, unspecified 04/07/2009   PT DENIES   GERD (gastroesophageal reflux disease)    History of kidney stones    HYPERTENSION 10/29/2007   HYPOTENSION 08/13/2010   LOW BACK PAIN 04/21/2008   NEPHROLITHIASIS, HX OF 04/21/2008   PUD, HX OF 04/21/2008   Renal disorder     Patient Active Problem List   Diagnosis Date Noted   Acute pain of right knee 07/15/2019   Gross hematuria 06/14/2019   Hyperlipidemia 11/11/2017   Community acquired pneumonia of right upper lobe of lung 11/05/2017   Right ureteral stone 11/05/2017   History of total right hip replacement 03/08/2017   History of prostate cancer 03/08/2017   Hx of chondrosarcoma 02/28/2017   High risk  medication use 02/28/2017   Spondylosis of lumbar region without myelopathy or radiculopathy 02/28/2017   DDD (degenerative disc disease), thoracic 02/28/2017   Ataxia 05/18/2016   Vestibular disequilibrium 05/18/2016   Ankylosing spondylitis ([place]) 05/24/2014   Lung nodule, solitary 05/24/2014   Peripheral edema 06/08/2012   EXTRINSIC ASTHMA, UNSPECIFIED 04/07/2009   LOW BACK PAIN 04/21/2008   History of peptic ulcer disease 04/21/2008   Essential hypertension 10/29/2007    Past Surgical History:  Procedure Laterality Date   bone cancer  1970   rt femur removed and steel rod placed   CYSTOSCOPY WITH RETROGRADE PYELOGRAM, URETEROSCOPY AND STENT PLACEMENT Right 11/06/2017   Procedure: CYSTOSCOPY WITH  STENT PLACEMENT RIGHT;  Surgeon: Raynelle Bring, MD;  Location: [facility];  Service: Urology;  Laterality: Right;   CYSTOSCOPY WITH RETROGRADE PYELOGRAM, URETEROSCOPY AND STENT PLACEMENT Left 06/09/2019   Procedure: CYSTOSCOPY WITH RETROGRADE PYELOGRAM, URETEROSCOPY AND STENT PLACEMENT X2;  Surgeon: [name], MD;  Location: [facility];  Service: Urology;  Laterality: Left;   CYSTOSCOPY/RETROGRADE/URETEROSCOPY     1 year ago at [place] in [place] Right 11/24/2017   Procedure: CYSTOSCOPY/URETEROSCOPY/ RETROGRADE/STENT REMOVAL;  Surgeon: Raynelle Bring, MD;  Location: [facility];  Service: Urology;  Laterality: Right;   prosatectomy          Home Medications    Prior to Admission medications   Medication Sig Start Date End Date Taking? Authorizing Provider  amLODipine (NORVASC) 10 MG tablet Take 10 mg at bedtime by mouth.  Yes [provider]  aspirin EC 81 MG tablet Take 81 mg daily by mouth.   Yes [provider]  atorvastatin (LIPITOR) 20 MG tablet Take 20 mg at bedtime by mouth.    Yes [provider]  carvedilol (COREG) 25 MG tablet Take 25 mg 2 (two) times daily by mouth.    Yes [provider]  isosorbide mononitrate (IMDUR) 30 MG 24 hr tablet Take 30 mg at bedtime by mouth.    Yes [provider]  meloxicam (MOBIC) 15 MG tablet Take 1 tablet (15 mg total) by mouth daily. 10/22/19  Yes Vivi Barrack, MD  omeprazole (PRILOSEC) 20 MG capsule Take 20 mg by mouth 2 (two) times a day.    Yes [provider]  predniSONE (DELTASONE) 5 MG tablet Take 5 mg by mouth daily with breakfast.   Yes [provider]  HYDROmorphone (DILAUDID) 2 MG tablet Take 1 tablet (2 mg total) by mouth every 4 (four) hours as needed for severe pain. From kidney stone 06/09/19   [name], MD  predniSONE (DELTASONE) 5 MG tablet Take 1 tablet (5 mg total) by mouth daily with breakfast. Resume once you have completed a taper of prednisone as instructed Patient not taking: Reported on 11/16/2019 05/19/16   Jonetta Osgood, MD    Family History Family History  Problem Relation Age of Onset   COPD Father    Cancer Other        prostate    Social History Social History   Tobacco Use   Smoking status: Former Smoker    Quit date: 12/30/1978    Years since quitting: 40.9   Smokeless tobacco: Never Used  Substance Use Topics   Alcohol use: No   Drug use: No     Allergies   Patient has no known allergies.   Review of Systems Review of Systems  Neurological: Positive for dizziness and headaches.  All other systems reviewed and are negative.    Physical Exam Updated Vital Signs BP (!) 184/91 (BP Location: Right Arm)    Pulse 76    Temp 98.8 F (37.1 C) (Oral)    Resp 17    Wt 110.7 kg    SpO2 97%    BMI 34.03 kg/m   Physical Exam Vitals signs and nursing note reviewed.  Constitutional:      Appearance: He is well-developed.  HENT:     Head: Normocephalic and atraumatic.     Comments: No temporal or facial tenderness to palpation Eyes:     Extraocular Movements: Extraocular movements intact.     Pupils: Pupils are equal, round, and reactive to light.    Cardiovascular:     Rate and Rhythm: Normal rate and regular rhythm.     Heart sounds: No murmur.  Pulmonary:     Effort: Pulmonary effort is normal. No respiratory distress.     Breath sounds: Normal breath sounds.  Abdominal:     Palpations: Abdomen is soft.     Tenderness: There is no abdominal tenderness. There is no guarding or rebound.  Musculoskeletal:        General: No tenderness.  Skin:    General: Skin is warm and dry.  Neurological:     Mental Status: He is alert and oriented to person, place, and time.     Comments: 5/5 strength in BUE.  No asymmetry of facial muscles.  EOMI.  4/5 strength in RLE (pt states chronic), 5/5 strength in  LLE.    Psychiatric:        Behavior: Behavior normal.      ED Treatments / Results  Labs (all labs ordered are listed, but only abnormal results are displayed) Labs Reviewed  COMPREHENSIVE METABOLIC PANEL - Abnormal; Notable for the following components:      Result Value   Glucose, Bld 145 (*)    BUN 25 (*)    Creatinine, Ser 1.38 (*)    Calcium 8.6 (*)    GFR calc non Af Amer 50 (*)    GFR calc Af Amer 58 (*)    All other components within normal limits  URINALYSIS, ROUTINE W REFLEX MICROSCOPIC - Abnormal; Notable for the following components:   Hgb urine dipstick SMALL (*)    All other components within normal limits  I-STAT CHEM 8, ED - Abnormal; Notable for the following components:   Creatinine, Ser 1.30 (*)    Glucose, Bld 146 (*)    All other components within normal limits  ETHANOL  PROTIME-INR  APTT  CBC  DIFFERENTIAL  RAPID URINE DRUG SCREEN, HOSP PERFORMED    EKG EKG Interpretation  Date/Time:  Tuesday November 16 2019 17:12:29 EST Ventricular Rate:  58 PR Interval:    QRS Duration: 77 QT Interval:  400 QTC Calculation: 390 R Axis:   32 Text Interpretation: Sinus rhythm Low voltage, precordial leads Baseline wander in lead(s) II III aVF V2 Confirmed by Veryl Speak [phone]) on 11/16/2019 5:16:48  PM   Radiology Dg Thoracic Spine 2 View  Result Date: 11/16/2019 CLINICAL DATA:  Fall.  Back pain EXAM: THORACIC SPINE 2 VIEWS COMPARISON:  None. FINDINGS: Flowing lateral and anterior osteophytes throughout the mid and lower thoracic spine. Appearance could reflect ankylosing spondylitis. No fracture or malalignment. IMPRESSION: No acute bony abnormality. Appearance concerning for ankylosing spondylitis. Electronically Signed   By: Rolm Baptise M.D.   On: 11/16/2019 19:52   Dg Lumbar Spine Complete  Result Date: 11/16/2019 CLINICAL DATA:  Fall, back pain EXAM: LUMBAR SPINE - COMPLETE 4+ VIEW COMPARISON:  11/03/2019 FINDINGS: Degenerative facet disease throughout the lumbar spine, most pronounced in the lower lumbar spine. Disc space narrowing at L5-S1. Normal alignment. No fracture. Aortic atherosclerosis. No visible aneurysm. IMPRESSION: Degenerative disc and facet disease.  No acute bony abnormality. Electronically Signed   By: Rolm Baptise M.D.   On: 11/16/2019 19:53   Ct Head Wo Contrast  Result Date: 11/16/2019 CLINICAL DATA:  Vertigo EXAM: CT HEAD WITHOUT CONTRAST TECHNIQUE: Contiguous axial images were obtained from the base of the skull through the vertex without intravenous contrast. COMPARISON:  08/16/2010 FINDINGS: Brain: No evidence of acute infarction, hemorrhage, hydrocephalus, extra-axial collection or mass lesion/mass effect. Scattered low-density changes within the periventricular and subcortical white matter compatible with chronic microvascular ischemic change. Mild diffuse cerebral volume loss. Vascular: Mild atherosclerotic calcifications involving the large vessels of the skull base. No unexpected hyperdense vessel. Skull: Normal. Negative for fracture or focal lesion. Sinuses/Orbits: No acute finding. Other: None. IMPRESSION: No acute intracranial findings. Electronically Signed   By: Davina Poke M.D.   On: 11/16/2019 17:26   Mr Angio Head Wo Contrast  Result Date:  11/16/2019 CLINICAL DATA:  73 year old male with vertigo. Headache for 1-2 days with dizziness resulting in fall. Blurred vision. EXAM: MRA HEAD WITHOUT CONTRAST TECHNIQUE: Angiographic images of the Circle of Willis were obtained using MRA technique without intravenous contrast. COMPARISON:  Head CT earlier today. FINDINGS: No intracranial mass effect or ventriculomegaly. Antegrade flow in  the posterior circulation with no distal vertebral stenosis. Normal PICA origins and vertebrobasilar junction. Patent basilar artery without stenosis. Normal SCA and PCA origins. Posterior communicating arteries are diminutive or absent. Bilateral PCA branches are within normal limits. Antegrade flow in both ICA siphons. No siphon stenosis. Ophthalmic artery origins appear normal. Patent carotid termini. Normal MCA and ACA origins. Diminutive or absent anterior communicating artery. Visible ACA branches are within normal limits. Left MCA M1 segment is mildly tortuous. Left MCA trifurcation and visible left MCA branches are within normal limits. Right MCA M1 segment and MCA bifurcation are also mildly ectatic without stenosis. Visible right MCA branches are within normal limits. IMPRESSION: Negative intracranial MRA. Electronically Signed   By: Genevie Ann M.D.   On: 11/16/2019 20:46   Mr Brain Wo Contrast  Result Date: 11/16/2019 CLINICAL DATA:  73 year old male with vertigo. Headache for 1-2 days with dizziness resulting in fall. Blurred vision. EXAM: MRI HEAD WITHOUT CONTRAST TECHNIQUE: Multiplanar, multiecho pulse sequences of the brain and surrounding structures were obtained without intravenous contrast. COMPARISON:  Head CT and intracranial MRA today. FINDINGS: Brain: No restricted diffusion to suggest acute infarction. No midline shift, mass effect, evidence of mass lesion, ventriculomegaly, extra-axial collection or acute intracranial hemorrhage. Cervicomedullary junction and pituitary are within normal limits. Patchy  and scattered bilateral cerebral white matter T2 and FLAIR hyperintensity in a nonspecific configuration. No cortical encephalomalacia or chronic cerebral blood products identified. Confluent involvement of the anterior left corona radiata. Mild T2 heterogeneity also in the deep gray nuclei most pronounced at the right caudate. Brainstem and cerebellum appear normal. Vascular: Major intracranial vascular flow voids are preserved. Skull and upper cervical spine: Negative for age visible cervical spine. Normal bone marrow signal. Sinuses/Orbits: Postoperative changes to both globes. Otherwise negative orbits. Trace paranasal sinus mucosal thickening. Other: Visible internal auditory structures appear normal. Trace mastoid fluid on the right. Negative visible nasopharynx. Scalp and face soft tissues appear negative. IMPRESSION: 1. No acute intracranial abnormality. 2. Moderate for age signal changes are nonspecific but most commonly due to chronic small vessel disease. Electronically Signed   By: Genevie Ann M.D.   On: 11/16/2019 20:58    Procedures Procedures (including critical care time)  Medications Ordered in ED Medications  sodium chloride 0.9 % bolus 500 mL (0 mLs Intravenous Stopped 11/16/19 2123)  metoCLOPramide (REGLAN) injection 10 mg (10 mg Intravenous Given 11/16/19 1946)     Initial Impression / Assessment and Plan / ED Course  I have reviewed the triage vital signs and the nursing notes.  Pertinent labs & imaging results that were available during my care of the patient were reviewed by me and considered in my medical decision making (see chart for details).        Patient here for evaluation of several days of progressive headache, dizziness followed by a fall in double vision transiently today. He is non-toxic appearing on evaluation. He does have right lower extremity weakness, which for patient is unchanged from baseline. He has chronic mobility issues due to chronic back pain is on  5 mg of prednisone daily. Presentation is not consistent with subarachnoid hemorrhage, CVA, temporal arteritis. No evidence of acute fracture/dislocation secondary to his fall. Counseled patient and wife on home care following fall, headache and dizziness. Discussed outpatient follow-up and return precautions.  Final Clinical Impressions(s) / ED Diagnoses   Final diagnoses:  Bad headache  Fall, initial encounter  Dizziness    ED Discharge Orders    None  Quintella Reichert, MD 11/16/19 2308

## 2019-11-17 NOTE — Telephone Encounter (Signed)
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

## 2019-11-17 NOTE — Telephone Encounter (Signed)
See below

## 2019-11-17 NOTE — Telephone Encounter (Signed)
Pt called in and stated that he still needs this med refilled.  I advised he may need to make an appt.  Please advise

## 2019-11-18 ENCOUNTER — Telehealth: Payer: Self-pay | Admitting: Family Medicine

## 2019-11-18 NOTE — Telephone Encounter (Signed)
140 on Friday the 20th is fine.

## 2019-11-18 NOTE — Telephone Encounter (Signed)
Please advise,can you close message Dr.Hunter refused.

## 2019-11-18 NOTE — Telephone Encounter (Signed)
Please advise on where patient can be worked in within the week for hospital follow up.   Copied from [place] [id number]. Topic: General - Other >> Nov 18, 2019  9:19 AM Antonieta Iba C wrote: Reason for CRM: pt called in to schedule a hospital follow up with PCP, not showing a opening with PCP. Please assist with scheduling.

## 2019-11-18 NOTE — Telephone Encounter (Signed)
Do you have any suggestions of where you want him?

## 2019-11-19 ENCOUNTER — Encounter: Payer: Self-pay | Admitting: Family Medicine

## 2019-11-19 ENCOUNTER — Ambulatory Visit (INDEPENDENT_AMBULATORY_CARE_PROVIDER_SITE_OTHER): Payer: Medicare Other | Admitting: Family Medicine

## 2019-11-19 VITALS — BP 177/96 | Ht 71.0 in | Wt 244.0 lb

## 2019-11-19 DIAGNOSIS — I10 Essential (primary) hypertension: Secondary | ICD-10-CM

## 2019-11-19 DIAGNOSIS — R42 Dizziness and giddiness: Secondary | ICD-10-CM

## 2019-11-19 DIAGNOSIS — R519 Headache, unspecified: Secondary | ICD-10-CM | POA: Diagnosis not present

## 2019-11-19 MED ORDER — MELOXICAM 15 MG PO TABS: 15.0000 mg | ORAL_TABLET | Freq: Every day | ORAL | 0 refills | Status: DC | PRN

## 2019-11-19 NOTE — Patient Instructions (Addendum)
Health Maintenance Due  Topic Date Due  . Hepatitis C Screening  Will address at next office visit  May 28, 1946  . COLONOSCOPY  06/15/1996  . PNA vac Low Risk Adult (2 of 2 - PCV13) declined for now  05/20/2015  . INFLUENZA VACCINE had at [place] updated notes  07/31/2019    Depression screen [facility] 2/9 08/28/2017 03/23/2015  Decreased Interest 0 0  Down, Depressed, Hopeless 0 0  PHQ - 2 Score 0 0    Recommended follow up: No follow-ups on file.

## 2019-11-19 NOTE — Telephone Encounter (Signed)
lvm for patient to call back to get scheduled today

## 2019-11-19 NOTE — Telephone Encounter (Signed)
Can you call and add this?

## 2019-11-19 NOTE — Telephone Encounter (Signed)
See note

## 2019-11-19 NOTE — Progress Notes (Signed)
Phone [phone] Virtual visit via Video note   Subjective:  Chief complaint: Chief Complaint  Patient presents with   Hospitalization Follow-up    This visit type was conducted due to national recommendations for restrictions regarding the COVID-19 Pandemic (e.g. social distancing).  This format is felt to be most appropriate for this patient at this time balancing risks to patient and risks to population by having him in for in person visit.  No physical exam was performed (except for noted visual exam or audio findings with Telehealth visits).    Our team/I connected with [name] at  1:00 PM EST by a video enabled telemedicine application ([email or web address] or caregility through epic) and verified that I am speaking with the correct person using two identifiers.  Location patient: [facility] Location provider: [facility], office Persons participating in the virtual visit:  patient, wife in background  Our team/I discussed the limitations of evaluation and management by telemedicine and the availability of in person appointments. In light of current covid-19 pandemic, patient also understands that we are trying to protect them by minimizing in office contact if at all possible.  The patient expressed consent for telemedicine visit and agreed to proceed. Patient understands insurance will be billed.   ROS-no fever chills reported.  No chest pain or increased shortness of breath reported.  Does report ongoing headaches-intermittent.  Reports back pain and hip pain.  Past Medical History-  Patient Active Problem List   Diagnosis Date Noted   High risk medication use 02/28/2017    Priority: High   Ankylosing spondylitis ([place]) 05/24/2014    Priority: High   Right ureteral stone 11/05/2017    Priority: Medium   History of prostate cancer 03/08/2017    Priority: Medium   LOW BACK PAIN 04/21/2008    Priority: Medium   Essential hypertension 10/29/2007    Priority: Medium    Community acquired pneumonia of right upper lobe of lung 11/05/2017    Priority: Low   Acute pain of right knee 07/15/2019   Gross hematuria 06/14/2019   Hyperlipidemia 11/11/2017   History of total right hip replacement 03/08/2017   Hx of chondrosarcoma 02/28/2017   Spondylosis of lumbar region without myelopathy or radiculopathy 02/28/2017   DDD (degenerative disc disease), thoracic 02/28/2017   Ataxia 05/18/2016   Vestibular disequilibrium 05/18/2016   Lung nodule, solitary 05/24/2014   Peripheral edema 06/08/2012   EXTRINSIC ASTHMA, UNSPECIFIED 04/07/2009   History of peptic ulcer disease 04/21/2008    Medications- reviewed and updated Current Outpatient Medications  Medication Sig Dispense Refill   amLODipine (NORVASC) 10 MG tablet Take 10 mg at bedtime by mouth.      aspirin EC 81 MG tablet Take 81 mg daily by mouth.     atorvastatin (LIPITOR) 20 MG tablet Take 20 mg at bedtime by mouth.      carvedilol (COREG) 25 MG tablet Take 25 mg 2 (two) times daily by mouth.      isosorbide mononitrate (IMDUR) 30 MG 24 hr tablet Take 30 mg at bedtime by mouth.      meloxicam (MOBIC) 15 MG tablet Take 1 tablet (15 mg total) by mouth daily as needed for pain. 30 tablet 0   omeprazole (PRILOSEC) 20 MG capsule Take 20 mg by mouth 2 (two) times a day.      predniSONE (DELTASONE) 5 MG tablet Take 5 mg by mouth daily with breakfast.     No current facility-administered medications for this visit.  Objective:  BP (!) 177/96    Ht '5\' 11"'  (1.803 m)    Wt 244 lb (110.7 kg)    BMI 34.03 kg/m  self reported vitals Gen: NAD, resting comfortably Lungs: nonlabored, normal respiratory rate  Skin: appears dry, no obvious rash     Assessment and Plan   # Headache/dizziness emergency room follow-up S: Patient was seen in the emergency room on 11/16/2019 with complaints of headache and dizziness.  He had intermittent frontal headaches waxing and waning with associated  dizziness.  Dizziness did not have any clear alleviating or worsening factors.  Dizziness was severe enough to cause a fall before he went to the emergency room.  He also complained of double vision intermittently after the fall  Due to progressive headache-CT scan was performed to rule out acute bleed or subarachnoid hemorrhage-no evidence on initial CT.  Due to severity of symptoms emergency provider obtained MRI of the brain and MRA of the brain-no masses or lesions were found.  Due to location of pain temporal arteritis was not thought likely-ESR and CRP were not drawn.  Since the emergency room visit patient has not had significant improvement in headaches or dizziness-still having these issues intermittently.  Thankfully he does have an ophthalmology visit this afternoon due to prior double vision-he did not report ongoing issues with this.  Patient's blood pressure is very elevated and potentially could contribute to headache.  He reports he is also having a lot of pain in his back with an epidural planned on Monday with Dr. Lise Auer.  He states plan is for surgery on his back if that does not work  Seen in ED. Not having much improvement in headache. Still having dizziness.   Thankfully patient is getting some relief when Taking tylenol and seems to take edge off. Headaches come and go.  A/P: 73 year old male with new headache pattern along with dizziness-he does have a history of vestibular disequilibrium so that portion is not a significant variants from intermittent issues in the past. -Also had double vision and thankfully has ophthalmology visit this afternoon -Has some slight watery nose and eyes but doubt sinusitis as primary cause of symptoms -He would like to schedule an in person visit for a physical-I am happy to see him sooner as that may take some time to get in if he has ongoing issues really with the dizziness-could consider vestibular rehab -I wonder if headaches could be  related to elevated blood pressure as well-intern I wonder if elevated blood pressure is related to back pain -Most life-threatening etiologies have been ruled out at this point-eyesight threatening etiologies can be ruled out this afternoon with ophthalmology visit-I wonder if they may add ESR or CRP -Considered prednisone to prevent rebound headaches after Tylenol but with upcoming epidural we opted against this  #hypertension S: compliant with amlodipine 10 mg, Imdur 30 mg, carvedilol 25 mg twice a day BP Readings from Last 3 Encounters:  11/19/19 (!) 177/96  11/16/19 (!) 184/91  10/22/19 110/78  A/P: Tough situation here-elevated blood pressure could be contributing to headaches.  Elevated blood pressure could be caused by back pain.  Patient has had some improvement in the past on meloxicam which he would like a refill on.  I agreed to this refill but asked patient to monitor his blood pressure carefully on this-if it increases he needs to discontinue.  I also asked him to update me within the next week about his blood pressures -hopeful after epidural blood pressure  may come down some-may need to consider adding an ACE inhibitor or diuretic like chlorthalidone   Recommended follow up: #Patient wants to call to schedule CPE.  As noted he will update me about blood pressure in 1 week or sooner if worsens-he will have to call back to schedule as needed to leave immediately to get to ophthalmology appointment  Lab/Order associations: No diagnosis found.  Meds ordered this encounter  Medications   meloxicam (MOBIC) 15 MG tablet    Sig: Take 1 tablet (15 mg total) by mouth daily as needed for pain.    Dispense:  30 tablet    Refill:  0    Return precautions advised.  [name], MD

## 2019-11-22 DIAGNOSIS — M5136 Other intervertebral disc degeneration, lumbar region: Secondary | ICD-10-CM | POA: Diagnosis not present

## 2019-11-22 DIAGNOSIS — M4726 Other spondylosis with radiculopathy, lumbar region: Secondary | ICD-10-CM | POA: Diagnosis not present

## 2019-11-22 DIAGNOSIS — M48061 Spinal stenosis, lumbar region without neurogenic claudication: Secondary | ICD-10-CM | POA: Diagnosis not present

## 2019-11-23 ENCOUNTER — Ambulatory Visit: Payer: Self-pay | Admitting: *Deleted

## 2019-11-23 NOTE — Telephone Encounter (Signed)
Pt's wife called with pt having a swollen right foot. She said that it is about 30% larger than the left one. It is bruised, like a purplish red on the top from the big toe to the little toe. Hurts when you press down on it. But no pain otherwise. No problems walking. Denies fever, calf pain, shortness of breath or chest pain. Per protocol, he needs to be seen within 4 hours. Will notify LB at [place] for an appointment. Pt is advised to go to an urgent care. She voiced understanding. Routing to LB at [facility] for review.  Reason for Disposition . [1] Redness AND [2] painful when touched AND [3] no fever  Answer Assessment - Initial Assessment Questions 1. LOCATION: "Which joint is swollen?"     Top of right foot 2. ONSET: "When did the swelling start?"     today 3. SIZE: "How large is the swelling?"     30% larger than the left one 4. PAIN: "Is there any pain?" If so, ask: "How bad is it?" (Scale 1-10; or mild, moderate, severe)    Bruised area is painful 5. CAUSE: "What do you think caused the swollen joint?"   Not sure 6. OTHER SYMPTOMS: "Do you have any other symptoms?" (e.g., fever, chest pain, difficulty breathing, calf pain)     no 7. PREGNANCY: "Is there any chance you are pregnant?" "When was your last menstrual period?"     n/a  Protocols used: ANKLE SWELLING-A-AH

## 2019-11-23 NOTE — Telephone Encounter (Signed)
A week ago and fell in the ED Virtual visit with Dr. Yong Channel o Friday See previous documentation

## 2019-11-24 ENCOUNTER — Other Ambulatory Visit: Payer: Self-pay

## 2019-11-29 ENCOUNTER — Ambulatory Visit (INDEPENDENT_AMBULATORY_CARE_PROVIDER_SITE_OTHER): Payer: Medicare Other

## 2019-11-29 ENCOUNTER — Encounter: Payer: Self-pay | Admitting: Family Medicine

## 2019-11-29 ENCOUNTER — Other Ambulatory Visit: Payer: Self-pay

## 2019-11-29 ENCOUNTER — Ambulatory Visit (INDEPENDENT_AMBULATORY_CARE_PROVIDER_SITE_OTHER): Payer: Medicare Other | Admitting: Family Medicine

## 2019-11-29 ENCOUNTER — Ambulatory Visit: Payer: Medicare Other | Admitting: Family Medicine

## 2019-11-29 VITALS — BP 130/72 | HR 76 | Temp 97.2°F | Ht 71.0 in | Wt 239.6 lb

## 2019-11-29 VITALS — BP 130/72 | HR 76 | Temp 97.2°F | Ht 71.0 in | Wt 239.0 lb

## 2019-11-29 DIAGNOSIS — M79671 Pain in right foot: Secondary | ICD-10-CM | POA: Diagnosis not present

## 2019-11-29 DIAGNOSIS — Z0000 Encounter for general adult medical examination without abnormal findings: Secondary | ICD-10-CM

## 2019-11-29 MED ORDER — VALACYCLOVIR HCL 1 G PO TABS: 1000.0000 mg | ORAL_TABLET | Freq: Two times a day (BID) | ORAL | 0 refills | Status: DC

## 2019-11-29 NOTE — Patient Instructions (Addendum)
Thank you for coming in today.  Take the valtrex for possible shingles.  Get xray tomorrow.  Recheck in 1 week if not better and xray does not change the plan.   I am concerned for shingles or an injury to the soft tissue or bone in the foot.    Shingles  Shingles is an infection. It gives you a painful skin rash and blisters that have fluid in them. Shingles is caused by the same germ (virus) that causes chickenpox. Shingles only happens in people who:  Have had chickenpox.  Have been given a shot of medicine (vaccine) to protect against chickenpox. Shingles is rare in this group. The first symptoms of shingles may be itching, tingling, or pain in an area on your skin. A rash will show on your skin a few days or weeks later. The rash is likely to be on one side of your body. The rash usually has a shape like a belt or a band. Over time, the rash turns into fluid-filled blisters. The blisters will break open, change into scabs, and dry up. Medicines may:  Help with pain and itching.  Help you get better sooner.  Help to prevent long-term problems. Follow these instructions at home: Medicines  Take over-the-counter and prescription medicines only as told by your doctor.  Put on an anti-itch cream or numbing cream where you have a rash, blisters, or scabs. Do this as told by your doctor. Helping with itching and discomfort   Put cold, wet cloths (cold compresses) on the area of the rash or blisters as told by your doctor.  Cool baths can help you feel better. Try adding baking soda or dry oatmeal to the water to lessen itching. Do not bathe in hot water. Blister and rash care  Keep your rash covered with a loose bandage (dressing).  Wear loose clothing that does not rub on your rash.  Keep your rash and blisters clean. To do this, wash the area with mild soap and cool water as told by your doctor.  Check your rash every day for signs of infection. Check for: ? More redness,  swelling, or pain. ? Fluid or blood. ? Warmth. ? Pus or a bad smell.  Do not scratch your rash. Do not pick at your blisters. To help you to not scratch: ? Keep your fingernails clean and cut short. ? Wear gloves or mittens when you sleep, if scratching is a problem. General instructions  Rest as told by your doctor.  Keep all follow-up visits as told by your doctor. This is important.  Wash your hands often with soap and water. If soap and water are not available, use hand sanitizer. Doing this lowers your chance of getting a skin infection caused by germs (bacteria).  Your infection can cause chickenpox in people who have never had chickenpox or never got a shot of chickenpox vaccine. If you have blisters that did not change into scabs yet, try not to touch other people or be around other people, especially: ? Babies. ? Pregnant women. ? Children who have areas of red, itchy, or rough skin (eczema). ? Very old people who have transplants. ? People who have a long-term (chronic) sickness, like cancer or AIDS. Contact a doctor if:  Your pain does not get better with medicine.  Your pain does not get better after the rash heals.  You have any signs of infection in the rash area. These signs include: ? More redness, swelling, or pain  around the rash. ? Fluid or blood coming from the rash. ? The rash area feeling warm to the touch. ? Pus or a bad smell coming from the rash. Get help right away if:  The rash is on your face or nose.  You have pain in your face or pain by your eye.  You lose feeling on one side of your face.  You have trouble seeing.  You have ear pain, or you have ringing in your ear.  You have a loss of taste.  Your condition gets worse. Summary  Shingles gives you a painful skin rash and blisters that have fluid in them.  Shingles is an infection. It is caused by the same germ (virus) that causes chickenpox.  Keep your rash covered with a loose  bandage (dressing). Wear loose clothing that does not rub on your rash.  If you have blisters that did not change into scabs yet, try not to touch other people or be around people. This information is not intended to replace advice given to you by your health care provider. Make sure you discuss any questions you have with your health care provider. Document Released: 06/03/2008 Document Revised: 04/09/2019 Document Reviewed: 08/20/2017 Elsevier Patient Education  2020 [employer].

## 2019-11-29 NOTE — Patient Instructions (Addendum)
[name] , Thank you for taking time to come for your Medicare Wellness Visit. I appreciate your ongoing commitment to your health goals. Please review the following plan we discussed and let me know if I can assist you in the future.   Screening recommendations/referrals: Colorectal Screening: recommended; please consider and let us know if this can be ordered  Vision and Dental Exams: Recommended annual ophthalmology exams for early detection of glaucoma and other disorders of the eye Recommended annual dental exams for proper oral hygiene  Vaccinations: Influenza vaccine: completed 10/14/19 Pneumococcal vaccine: Prevnar recommended  Tdap vaccine: up to date; last 03/23/15 [facility]ingles [facility]: [facility]   Advanced directives: Please bring a copy of your POA (Power of [place]) and/or Living Will to your next appointment.  Goals: Recommend to drink at least 6-8 8oz glasses of water per day and consume a balanced diet rich in fresh fruits and vegetables.   Next appointment: Please schedule your Annual Wellness Visit with your Nurse Health Advisor in one year.  Preventive Care 34 Years and Older, Male Preventive care refers to lifestyle choices and visits with your health care provider that can promote health and wellness. What does preventive care include?  A yearly physical exam. This is also called an annual well check.  Dental exams once or twice a year.  Routine eye exams. Ask your health care provider how often you should have your eyes checked.  Personal lifestyle choices, including:  Daily care of your teeth and gums.  Regular physical activity.  Eating a healthy diet.  Avoiding tobacco and drug use.  Limiting alcohol use.  Practicing safe sex.  Taking low doses of aspirin every day if recommended by your health care provider..  Taking vitamin and mineral supplements as recommended by your health care provider. What happens during an annual well  check? The services and screenings done by your health care provider during your annual well check will depend on your age, overall health, lifestyle risk factors, and family history of disease. Counseling  Your health care provider may ask you questions about your:  Alcohol use.  Tobacco use.  Drug use.  Emotional well-being.  Home and relationship well-being.  Sexual activity.  Eating habits.  History of falls.  Memory and ability to understand (cognition).  Work and work Statistician. Screening  You may have the following tests or measurements:  Height, weight, and BMI.  Blood pressure.  Lipid and cholesterol levels. These may be checked every 5 years, or more frequently if you are over 65 years old.  Skin check.  Lung cancer screening. You may have this screening every year starting at age 23 if you have a 30-pack-year history of smoking and currently smoke or have quit within the past 15 years.  Fecal occult blood test (FOBT) of the stool. You may have this test every year starting at age 72.  Flexible sigmoidoscopy or colonoscopy. You may have a sigmoidoscopy every 5 years or a colonoscopy every 10 years starting at age 31.  Prostate cancer screening. Recommendations will vary depending on your family history and other risks.  Hepatitis C blood test.  Hepatitis B blood test.  Sexually transmitted disease (STD) testing.  Diabetes screening. This is done by checking your blood sugar (glucose) after you have not eaten for a while (fasting). You may have this done every 1-3 years.  Abdominal aortic aneurysm (AAA) screening. You may need this if you are a current or former smoker.  Osteoporosis.  You may be screened starting at age 75 if you are at high risk. Talk with your health care provider about your test results, treatment options, and if necessary, the need for more tests. Vaccines  Your health care provider may recommend certain vaccines, such as:   Influenza vaccine. This is recommended every year.  Tetanus, diphtheria, and acellular pertussis (Tdap, Td) vaccine. You may need a Td booster every 10 years.  Zoster vaccine. You may need this after age 8.  Pneumococcal 13-valent conjugate (PCV13) vaccine. One dose is recommended after age 74.  Pneumococcal polysaccharide (PPSV23) vaccine. One dose is recommended after age 52. Talk to your health care provider about which screenings and vaccines you need and how often you need them. This information is not intended to replace advice given to you by your health care provider. Make sure you discuss any questions you have with your health care provider. Document Released: 01/12/2016 Document Revised: 09/04/2016 Document Reviewed: 10/17/2015 Elsevier Interactive Patient Education  2017 [employer] Prevention in the Home Falls can cause injuries. They can happen to people of all ages. There are many things you can do to make your home safe and to help prevent falls. What can I do on the outside of my home?  Regularly fix the edges of walkways and driveways and fix any cracks.  Remove anything that might make you trip as you walk through a door, such as a raised step or threshold.  Trim any bushes or trees on the path to your home.  Use bright outdoor lighting.  Clear any walking paths of anything that might make someone trip, such as rocks or tools.  Regularly check to see if handrails are loose or broken. Make sure that both sides of any steps have handrails.  Any raised decks and porches should have guardrails on the edges.  Have any leaves, snow, or ice cleared regularly.  Use sand or salt on walking paths during winter.  Clean up any spills in your garage right away. This includes oil or grease spills. What can I do in the bathroom?  Use night lights.  Install grab bars by the toilet and in the tub and shower. Do not use towel bars as grab bars.  Use non-skid mats  or decals in the tub or shower.  If you need to sit down in the shower, use a plastic, non-slip stool.  Keep the floor dry. Clean up any water that spills on the floor as soon as it happens.  Remove soap buildup in the tub or shower regularly.  Attach bath mats securely with double-sided non-slip rug tape.  Do not have throw rugs and other things on the floor that can make you trip. What can I do in the bedroom?  Use night lights.  Make sure that you have a light by your bed that is easy to reach.  Do not use any sheets or blankets that are too big for your bed. They should not hang down onto the floor.  Have a firm chair that has side arms. You can use this for support while you get dressed.  Do not have throw rugs and other things on the floor that can make you trip. What can I do in the kitchen?  Clean up any spills right away.  Avoid walking on wet floors.  Keep items that you use a lot in easy-to-reach places.  If you need to reach something above you, use a strong step stool  that has a grab bar.  Keep electrical cords out of the way.  Do not use floor polish or wax that makes floors slippery. If you must use wax, use non-skid floor wax.  Do not have throw rugs and other things on the floor that can make you trip. What can I do with my stairs?  Do not leave any items on the stairs.  Make sure that there are handrails on both sides of the stairs and use them. Fix handrails that are broken or loose. Make sure that handrails are as long as the stairways.  Check any carpeting to make sure that it is firmly attached to the stairs. Fix any carpet that is loose or worn.  Avoid having throw rugs at the top or bottom of the stairs. If you do have throw rugs, attach them to the floor with carpet tape.  Make sure that you have a light switch at the top of the stairs and the bottom of the stairs. If you do not have them, ask someone to add them for you. What else can I do to  help prevent falls?  Wear shoes that:  Do not have high heels.  Have rubber bottoms.  Are comfortable and fit you well.  Are closed at the toe. Do not wear sandals.  If you use a stepladder:  Make sure that it is fully opened. Do not climb a closed stepladder.  Make sure that both sides of the stepladder are locked into place.  Ask someone to hold it for you, if possible.  Clearly mark and make sure that you can see:  Any grab bars or handrails.  First and last steps.  Where the edge of each step is.  Use tools that help you move around (mobility aids) if they are needed. These include:  Canes.  Walkers.  Scooters.  Crutches.  Turn on the lights when you go into a dark area. Replace any light bulbs as soon as they burn out.  Set up your furniture so you have a clear path. Avoid moving your furniture around.  If any of your floors are uneven, fix them.  If there are any pets around you, be aware of where they are.  Review your medicines with your doctor. Some medicines can make you feel dizzy. This can increase your chance of falling. Ask your doctor what other things that you can do to help prevent falls. This information is not intended to replace advice given to you by your health care provider. Make sure you discuss any questions you have with your health care provider. Document Released: 10/12/2009 Document Revised: 05/23/2016 Document Reviewed: 01/20/2015 Elsevier Interactive Patient Education  2017 [employer].

## 2019-11-29 NOTE — Progress Notes (Signed)
Subjective:   Bruce Ball is a 73 y.o. male who presents for an Initial Medicare Annual Wellness Visit.  Review of Systems   Cardiac Risk Factors include: advanced age (>97men, >22 women);male gender;hypertension;sedentary lifestyle;dyslipidemia   Objective:    Today's Vitals   11/29/19 1348  BP: 130/72  Pulse: 76  Temp: (!) 97.2 F (36.2 C)  Weight: 239 lb 9.6 oz (108.7 kg)  Height: 5\' 11"  (1.803 m)   Body mass index is 33.42 kg/m.  Advanced Directives 11/29/2019 11/16/2019 07/14/2019 06/09/2019 06/03/2019 05/31/2019 05/30/2019  Does Patient Have a Medical Advance Directive? Yes No No Yes Yes Yes Yes  Type of Advance Directive Living will;Healthcare Power of [place];Living will [place];Living will [place];Living will [place];Living will  Does patient want to make changes to medical advance directive? No - Patient declined - - No - Patient declined No - Patient declined - -  Copy of [place] in Chart? No - copy requested - - No - copy requested - - -  Would patient like information on creating a medical advance directive? - - No - Patient declined - - - -    Current Medications (verified) Outpatient Encounter Medications as of 11/29/2019  Medication Sig  . amLODipine (NORVASC) 10 MG tablet Take 10 mg at bedtime by mouth.   Marland Kitchen aspirin EC 81 MG tablet Take 81 mg daily by mouth.  Marland Kitchen atorvastatin (LIPITOR) 20 MG tablet Take 20 mg at bedtime by mouth.   . carvedilol (COREG) 25 MG tablet Take 25 mg 2 (two) times daily by mouth.   . isosorbide mononitrate (IMDUR) 30 MG 24 hr tablet Take 30 mg at bedtime by mouth.   . meloxicam (MOBIC) 15 MG tablet Take 1 tablet (15 mg total) by mouth daily as needed for pain.  Marland Kitchen omeprazole (PRILOSEC) 20 MG capsule Take 20 mg by mouth 2 (two) times a day.   . predniSONE (DELTASONE) 5 MG tablet Take 5 mg by mouth daily with breakfast.   No facility-administered encounter medications on file as of 11/29/2019.     Allergies (verified) Patient has no known allergies.   History: Past Medical History:  Diagnosis Date  . Chronic back pain greater than 3 months duration   . Chronic leg pain   . Deafness in right ear   . Extrinsic asthma, unspecified 04/07/2009   PT DENIES  . GERD (gastroesophageal reflux disease)   . History of kidney stones   . HYPERTENSION 10/29/2007  . HYPOTENSION 08/13/2010  . LOW BACK PAIN 04/21/2008  . NEPHROLITHIASIS, HX OF 04/21/2008  . PUD, HX OF 04/21/2008  . Renal disorder    Past Surgical History:  Procedure Laterality Date  . bone cancer  1970   rt femur removed and steel rod placed  . CYSTOSCOPY WITH RETROGRADE PYELOGRAM, URETEROSCOPY AND STENT PLACEMENT Right 11/06/2017   Procedure: CYSTOSCOPY WITH  STENT PLACEMENT RIGHT;  Surgeon: Raynelle Bring, MD;  Location: [facility];  Service: Urology;  Laterality: Right;  . CYSTOSCOPY WITH RETROGRADE PYELOGRAM, URETEROSCOPY AND STENT PLACEMENT Left 06/09/2019   Procedure: CYSTOSCOPY WITH RETROGRADE PYELOGRAM, URETEROSCOPY AND STENT PLACEMENT X2;  Surgeon: [name], MD;  Location[facility];  Service: Urology;  Laterality: Left;  . CYSTOSCOPY/RETROGRADE/URETEROSCOPY     1 year ago at [place] in [place]  . CYSTOSCOPY/URETEROSCOPY/HOLMIUM LASER Right 11/24/2017   Procedure: CYSTOSCOPY/URETEROSCOPY/ RETROGRADE/STENT REMOVAL;  Surgeon: Raynelle Bring, MD;  [facility];  Service: Urology;  Laterality: Right;  . prosatectomy     Family History  Problem Relation Age of Onset  . COPD Father   . Cancer Other        prostate   Social History   Socioeconomic History  . Marital status: Married    Spouse name: Not on file  . Number of children: Not on file  . Years of education: Not on file  . Highest education level: Not on file  Occupational History  . Occupation: Optometrist  Social Needs  . Financial resource strain: Not on file  . Food  insecurity    Worry: Not on file    Inability: Not on file  . Transportation needs    Medical: Not on file    Non-medical: Not on file  Tobacco Use  . Smoking status: Former Smoker    Quit date: 12/30/1978    Years since quitting: 40.9  . Smokeless tobacco: Never Used  Substance and Sexual Activity  . Alcohol use: No  . Drug use: No  . Sexual activity: Not on file  Lifestyle  . Physical activity    Days per week: Not on file    Minutes per session: Not on file  . Stress: Not on file  Relationships  . Social Herbalist on phone: Not on file    Gets together: Not on file    Attends religious service: Not on file    Active member of club or organization: Not on file    Attends meetings of clubs or organizations: Not on file    Relationship status: Not on file  Other Topics Concern  . Not on file  Social History Narrative  . Not on file   Tobacco Counseling Counseling given: Not Answered   Clinical Intake:  Pre-visit preparation completed: Yes  Diabetes: No  Interpreter Needed?: No  Information entered by :: Denman Mykeal LPN  Activities of Daily Living In your present state of health, do you have any difficulty performing the following activities: 11/29/2019 06/03/2019  Hearing? Y N  Comment bilateral hearing aids -  Vision? N N  Difficulty concentrating or making decisions? Y N  Comment intermittent noticed more since recent falls -  Walking or climbing stairs? Y Y  Comment - DUE TO PAIN  Dressing or bathing? N N  Doing errands, shopping? N N  Preparing Food and eating ? N -  Using the Toilet? N -  In the past six months, have you accidently leaked urine? N -  Do you have problems with loss of bowel control? N -  Managing your Medications? N -  Managing your Finances? N -  Housekeeping or managing your Housekeeping? N -  Some recent data might be hidden     Immunizations and Health Maintenance Immunization History  Administered Date(s)  Administered  . Influenza Split 12/30/2013  . Influenza, High Dose Seasonal PF 10/14/2019  . Influenza-Unspecified 12/13/2015  . Pneumococcal-Unspecified 05/19/2014  . Td 03/23/2015   Health Maintenance Due  Topic Date Due  . Hepatitis C Screening  06/12/1946  . COLONOSCOPY  06/15/1996  . PNA vac Low Risk Adult (2 of 2 - PCV13) 05/20/2015    Patient Care Team: [name], MD as PCP - General (Family Medicine) [facility], [facility] as Consulting Physician Jovita Gamma, MD as Consulting Physician (Neurosurgery) Clinic, [facility] as Consulting Physician  Indicate any recent [place] you may have received from  other than [facility] providers in the past year (date may be approximate).    Assessment:   This is a routine wellness examination for Bruce Ball.  Hearing/Vision screen  Hearing Screening   125Hz  250Hz  500Hz  1000Hz  2000Hz  3000Hz  4000Hz  6000Hz  8000Hz   Right ear:           Left ear:           Comments: Bilateral hearing aids- [facility]   Dietary issues and exercise activities discussed: Current Exercise Habits: The patient does not participate in regular exercise at present  Goals   None    Depression Screen PHQ 2/9 Scores 11/29/2019 08/28/2017 03/23/2015  PHQ - 2 Score 1 0 0    Fall Risk Fall Risk  11/29/2019 08/28/2017 08/26/2016 03/23/2015  Falls in the past year? 1 Yes No No  Comment - - Emmi Telephone Survey: data to providers prior to load -  Number falls in past yr: 1 2 or more - -  Injury with Fall? 1 Yes - -  Risk for fall due to : History of fall(s);Impaired mobility;Impaired balance/gait - - -  Follow up Education provided;Falls evaluation completed;Falls prevention discussed - - -    Is the patient's home free of loose throw rugs in walkways, pet beds, electrical cords, etc?   yes      Grab bars in the bathroom? yes      Handrails on the stairs?   yes      Adequate lighting?   yes  Timed Get Up and Go performed:  completed and within normal timeframe; patient does have some shuffling gait and states that he normally uses a walker   Cognitive Function:     6CIT Screen 11/29/2019  What Year? 0 points  What month? 0 points  What time? 0 points  Count back from 20 0 points  Months in reverse 0 points  Repeat phrase 4 points  Total Score 4    Screening Tests Health Maintenance  Topic Date Due  . Hepatitis C Screening  Nov 24, 1946  . COLONOSCOPY  06/15/1996  . PNA vac Low Risk Adult (2 of 2 - PCV13) 05/20/2015  . TETANUS/TDAP  03/22/2025  . INFLUENZA VACCINE  Completed    Qualifies for Shingles Vaccine? [facility] with [facility]  Cancer Screenings: Lung: Low Dose CT Chest recommended if Age 55-80 years, 30 pack-year currently smoking OR have quit w/in 15years. Patient does not qualify. Colorectal: states that this was completed through the [place]; will request records    Plan:  I have personally reviewed and addressed the Medicare Annual Wellness questionnaire and have noted the following in the patient's chart:  A. Medical and social history B. Use of alcohol, tobacco or illicit drugs  C. Current medications and supplements D. Functional ability and status E.  Nutritional status F.  Physical activity G. Advance directives H. List of other physicians I.  Hospitalizations, surgeries, and ER visits in previous 12 months J.  [place] such as hearing and vision if needed, cognitive and depression L. Referrals, records requested, and appointments- records from the [place] for vaccines, colonoscopy report and audiology   In addition, I have reviewed and discussed with patient certain preventive protocols, quality metrics, and best practice recommendations. A written personalized care plan for preventive services as well as general preventive health recommendations were provided to patient.   Signed,  Denman Kishawn, LPN  Nurse Health Advisor   Nurse Notes: Patient seen for acute visit due  to pain in  right foot and bruising

## 2019-11-29 NOTE — Progress Notes (Signed)
I, Bruce Ball, LAT, ATC, am serving as scribe for Dr. Lynne Leader.  Bruce Ball is a 73 y.o. male who presents to [facility] today for R foot pain.  Pt was last seen by Sports Medicine (Dr. Raeford Razor) on 08/12/19 for R knee pain.    Pt's current R foot pain started about a week ago w/ no known MOI.  His pain is located at the top of his R foot at the MTP joints.  Pt has bruising along R MTP joints 1-4.  Pt rates his pain as 5/10 aching, throbbing pain.  He has numbness in his R foot from midfoot distal to his toes.  He cannot specify any aggravating factors.  He has not tried any treatments for his current condition.  If not particularly painful room in the shoe or with activity.  He notes it is not completely consistent with previous episodes of shingles.  He cannot recall any injury history.    ROS:  As above  Exam:  BP 130/72   Pulse 76   Temp (!) 97.2 F (36.2 C) (Temporal)   Ht 5\' 11"  (1.803 m)   Wt 239 lb (108.4 kg)   SpO2 95%   BMI 33.33 kg/m  Wt Readings from Last 5 Encounters:  11/29/19 239 lb (108.4 kg)  11/29/19 239 lb 9.6 oz (108.7 kg)  11/19/19 244 lb (110.7 kg)  11/16/19 244 lb (110.7 kg)  10/22/19 245 lb 6.1 oz (111.3 kg)   General: Well Developed, well nourished, and in no acute distress.  Neuro/Psych: Alert and oriented x3, extra-ocular muscles intact, able to move all 4 extremities, sensation grossly intact. Skin: Warm and dry, no rashes noted.  Respiratory: Not using accessory muscles, speaking in full sentences, trachea midline.  Cardiovascular: Pulses palpable, no extremity edema. Abdomen: Does not appear distended. MSK:  Right foot: Slightly swollen at dorsal mid and forefoot with macular intermittent erythematous lesion at dorsal foot. Tender palpation along dorsal distal first second third and fourth metatarsals.  Foot motion is intact.  Pulses cap refill and sensation are intact distally.        Lab and Radiology Results X-ray  right foot ordered.    Assessment and Plan: 73 y.o. male with  Right foot swelling with macular erythematous rash.  Patient does not have any injury history to explain his pain although fracture or soft tissue injury is a possibility.  Shingles is also on the differential.  Plan to treat empirically with Valtrex now.  We will decrease the dose of Valtrex a bit given suppressed GFR when last checked earlier this month.  We will plan for x-ray and recheck in 1 week unless all better.   PDMP not reviewed this encounter. Orders Placed This Encounter  Procedures  . DG Foot Complete Right    Standing Status:   Future    Standing Expiration Date:   01/28/2021    Order Specific Question:   Reason for Exam (SYMPTOM  OR DIAGNOSIS REQUIRED)    Answer:   eval foot pain and swelling    Order Specific Question:   Preferred imaging location?    Answer:   [facility]    Order Specific Question:   Radiology Contrast Protocol - do NOT remove file path    Answer:   [email or web address]   Meds ordered this encounter  Medications  . valACYclovir (VALTREX) 1000 MG tablet    Sig: Take 1 tablet (1,000 mg total) by mouth 2 (two) times  daily.    Dispense:  14 tablet    Refill:  0    Historical information moved to improve visibility of documentation.  Past Medical History:  Diagnosis Date  . Chronic back pain greater than 3 months duration   . Chronic leg pain   . Deafness in right ear   . Extrinsic asthma, unspecified 04/07/2009   PT DENIES  . GERD (gastroesophageal reflux disease)   . History of kidney stones   . HYPERTENSION 10/29/2007  . HYPOTENSION 08/13/2010  . LOW BACK PAIN 04/21/2008  . NEPHROLITHIASIS, HX OF 04/21/2008  . PUD, HX OF 04/21/2008  . Renal disorder    Past Surgical History:  Procedure Laterality Date  . bone cancer  1970   rt femur removed and steel rod placed  . CYSTOSCOPY WITH RETROGRADE PYELOGRAM, URETEROSCOPY AND STENT  PLACEMENT Right 11/06/2017   Procedure: CYSTOSCOPY WITH  STENT PLACEMENT RIGHT;  Surgeon: Raynelle Bring, MD;  [facility];  Service: Urology;  Laterality: Right;  . CYSTOSCOPY WITH RETROGRADE PYELOGRAM, URETEROSCOPY AND STENT PLACEMENT Left 06/09/2019   Procedure: CYSTOSCOPY WITH RETROGRADE PYELOGRAM, URETEROSCOPY AND STENT PLACEMENT X2;  Surgeon: [name], MD;  [facility];  Service: Urology;  Laterality: Left;  . CYSTOSCOPY/RETROGRADE/URETEROSCOPY     1 year ago at [place] in [place]  . CYSTOSCOPY/URETEROSCOPY/HOLMIUM LASER Right 11/24/2017   Procedure: CYSTOSCOPY/URETEROSCOPY/ RETROGRADE/STENT REMOVAL;  Surgeon: Raynelle Bring, MD;  [facility];  Service: Urology;  Laterality: Right;  . prosatectomy     Social History   Tobacco Use  . Smoking status: Former Smoker    Quit date: 12/30/1978    Years since quitting: 40.9  . Smokeless tobacco: Never Used  Substance Use Topics  . Alcohol use: No   family history includes COPD in his father; Cancer in an other family member.  Medications: Current Outpatient Medications  Medication Sig Dispense Refill  . amLODipine (NORVASC) 10 MG tablet Take 10 mg at bedtime by mouth.     Marland Kitchen aspirin EC 81 MG tablet Take 81 mg daily by mouth.    Marland Kitchen atorvastatin (LIPITOR) 20 MG tablet Take 20 mg at bedtime by mouth.     . carvedilol (COREG) 25 MG tablet Take 25 mg 2 (two) times daily by mouth.     . isosorbide mononitrate (IMDUR) 30 MG 24 hr tablet Take 30 mg at bedtime by mouth.     . meloxicam (MOBIC) 15 MG tablet Take 1 tablet (15 mg total) by mouth daily as needed for pain. 30 tablet 0  . omeprazole (PRILOSEC) 20 MG capsule Take 20 mg by mouth 2 (two) times a day.     . predniSONE (DELTASONE) 5 MG tablet Take 5 mg by mouth daily with breakfast.    . valACYclovir (VALTREX) 1000 MG tablet Take 1 tablet (1,000 mg total) by mouth 2 (two) times daily. 14 tablet 0   No current facility-administered medications for this visit.    No  Known Allergies    Discussed warning signs or symptoms. Please see discharge instructions. Patient expresses understanding.  The above documentation has been reviewed and is accurate and complete Lynne Leader

## 2019-11-29 NOTE — Progress Notes (Signed)
I have reviewed and agree with note, evaluation, plan.  Dr. Georgina Snell of sports medicine was kind enough to see him for acute concern today.  [name], MD

## 2019-11-30 ENCOUNTER — Ambulatory Visit (INDEPENDENT_AMBULATORY_CARE_PROVIDER_SITE_OTHER): Payer: Medicare Other

## 2019-11-30 ENCOUNTER — Other Ambulatory Visit: Payer: Medicare Other

## 2019-11-30 DIAGNOSIS — M79671 Pain in right foot: Secondary | ICD-10-CM

## 2019-11-30 LAB — BASIC METABOLIC PANEL
BUN: 30 — AB (ref 4–21)
CO2: 30 — AB (ref 13–22)
Chloride: 110 — AB (ref 99–108)
Creatinine: 1.5 — AB (ref <=1.3)
Glucose: 99
Potassium: 4.8 (ref 3.4–5.3)
Sodium: 144 (ref 137–147)

## 2019-11-30 LAB — COMPREHENSIVE METABOLIC PANEL: Albumin: 3.2 — AB (ref 3.5–5.0)

## 2019-11-30 LAB — LIPID PANEL
Cholesterol: 104 (ref 0–200)
HDL: 56 (ref 35–70)
LDL Cholesterol: 38
Triglycerides: 49 (ref 40–160)

## 2019-11-30 LAB — CBC AND DIFFERENTIAL
HCT: 50 (ref 41–53)
Hemoglobin: 16.5 (ref 13.5–17.5)
Platelets: 222 (ref 150–399)
WBC: 13.8

## 2019-11-30 LAB — TSH: TSH: 1.45 (ref <=5.90)

## 2019-11-30 LAB — HEPATIC FUNCTION PANEL
ALT: 50 — AB (ref 10–40)
AST: 20 (ref 14–40)

## 2019-11-30 LAB — CBC: RBC: 5.77 — AB (ref 3.87–5.11)

## 2019-11-30 NOTE — Patient Instructions (Signed)
Health Maintenance Due  Topic Date Due  . Hepatitis C Screening  1946-07-07  . COLONOSCOPY  06/15/1996  . PNA vac Low Risk Adult (2 of 2 - PCV13) 05/20/2015    Depression screen PHQ 2/9 11/29/2019 08/28/2017 03/23/2015  Decreased Interest 1 0 0  Down, Depressed, Hopeless 0 0 0  PHQ - 2 Score 1 0 0    Recommended follow up: No follow-ups on file.

## 2019-11-30 NOTE — Progress Notes (Signed)
Patient was moved to sports medicine encounter

## 2019-12-01 NOTE — Progress Notes (Signed)
X-ray foot shows no fractures or severe arthritis

## 2019-12-06 ENCOUNTER — Ambulatory Visit: Payer: Self-pay

## 2019-12-06 ENCOUNTER — Other Ambulatory Visit: Payer: Self-pay

## 2019-12-06 ENCOUNTER — Encounter: Payer: Self-pay | Admitting: Family Medicine

## 2019-12-06 ENCOUNTER — Ambulatory Visit (INDEPENDENT_AMBULATORY_CARE_PROVIDER_SITE_OTHER): Payer: Medicare Other | Admitting: Family Medicine

## 2019-12-06 VITALS — BP 118/72 | HR 68 | Ht 71.0 in | Wt 235.0 lb

## 2019-12-06 DIAGNOSIS — M79671 Pain in right foot: Secondary | ICD-10-CM | POA: Diagnosis not present

## 2019-12-06 NOTE — Progress Notes (Signed)
I, [name], LAT, ATC, am serving as scribe for Dr. Lynne Leader.  Bruce Ball is a 73 y.o. male who presents to [facility] today for f/u of R foot pain and bruising along MTPs 1-4.  He was prescribed Valtrex for possible shingles.  Since his last visit, pt notes some slight improvement in the bruising along his dorsal foot but reports no change in terms of pain.  Pt con't to take the Valtrex.     ROS:  As above  Exam:  BP 118/72 (BP Location: Left Arm, Patient Position: Sitting, Cuff Size: Large)   Pulse 68   Ht 5\' 11"  (1.803 m)   Wt 235 lb (106.6 kg)   SpO2 97%   BMI 32.78 kg/m  Wt Readings from Last 5 Encounters:  12/06/19 235 lb (106.6 kg)  11/29/19 239 lb 9.6 oz (108.7 kg)  11/29/19 239 lb (108.4 kg)  11/29/19 239 lb 9.6 oz (108.7 kg)  11/19/19 244 lb (110.7 kg)   General: Well Developed, well nourished, and in no acute distress.  Neuro/Psych: Alert and oriented x3, extra-ocular muscles intact, able to move all 4 extremities, sensation grossly intact. Skin: Warm and dry, no rashes noted.  Respiratory: Not using accessory muscles, speaking in full sentences, trachea midline.  Cardiovascular: Pulses palpable, no extremity edema. Abdomen: Does not appear distended. MSK:  Right foot: Slight swelling with slight macular erythema at dorsal aspect of MTPs. Tender to palpation interdigital space between second and third MTP. Normal foot and ankle motion.  Pulses cap refill and sensation are intact distally.    Lab and Radiology Results Limited musculoskeletal ultrasound right dorsal foot. Normal-appearing bone cortex and joint at second and third MTP.  Normal-appearing soft tissue at interdigital space second and third MTP. Intact blood flow at artery present at soft tissue between second and third MTP Impression: Relatively normal dorsal foot ultrasound.   EXAM: RIGHT FOOT COMPLETE - 3+ VIEW 11/30/19  COMPARISON:  None.  FINDINGS: Diffuse  osteopenia. No acute bony abnormality. Specifically, no fracture, subluxation, or dislocation. Joint spaces maintained. No radiopaque foreign bodies.  IMPRESSION: No acute bony abnormality.   Electronically Signed   By: Rolm Baptise M.D.   On: 11/30/2019 21:06  I, Lynne Leader, personally (independently) visualized and performed the interpretation of the images attached in this note.   Assessment and Plan: 73 y.o. male with  Right dorsal foot pain.  Etiology is unclear at this time.  Shingles is quite unlikely given his physical exam today.  Morton's neuroma or other peripheral nerve issue is also a possibility.  Stress fracture is less likely given the absence of pain along the metatarsal shafts. After discussion plan for relative rest and a bit of watchful waiting.  If no improvement in 2 to 3 weeks we will proceed with nerve conduction study/EMG.  At that time his knee will be symptomatic for 6 weeks and nerve conduction study/EMG will be more likely to be diagnostic.  Recheck back as needed.  Discussed treatment patient declines trial of gabapentin today.   PDMP not reviewed this encounter. Orders Placed This Encounter  Procedures  . NO CHG - [name] LOWER RIGHT    Order Specific Question:   Reason for Exam (SYMPTOM  OR DIAGNOSIS REQUIRED)    Answer:   R foot pain    Order Specific Question:   Preferred imaging location?    Answer:   [facility]   No orders of the defined types were  placed in this encounter.   Historical information moved to improve visibility of documentation.  Past Medical History:  Diagnosis Date  . Chronic back pain greater than 3 months duration   . Chronic leg pain   . Deafness in right ear   . Extrinsic asthma, unspecified 04/07/2009   PT DENIES  . GERD (gastroesophageal reflux disease)   . History of kidney stones   . HYPERTENSION 10/29/2007  . HYPOTENSION 08/13/2010  . LOW BACK PAIN 04/21/2008  . NEPHROLITHIASIS, HX OF 04/21/2008  . PUD,  HX OF 04/21/2008  . Renal disorder    Past Surgical History:  Procedure Laterality Date  . bone cancer  1970   rt femur removed and steel rod placed  . CYSTOSCOPY WITH RETROGRADE PYELOGRAM, URETEROSCOPY AND STENT PLACEMENT Right 11/06/2017   Procedure: CYSTOSCOPY WITH  STENT PLACEMENT RIGHT;  Surgeon: Raynelle Bring, MD;  [facility];  Service: Urology;  Laterality: Right;  . CYSTOSCOPY WITH RETROGRADE PYELOGRAM, URETEROSCOPY AND STENT PLACEMENT Left 06/09/2019   Procedure: CYSTOSCOPY WITH RETROGRADE PYELOGRAM, URETEROSCOPY AND STENT PLACEMENT X2;  Surgeon: [name], MD;  [facility];  Service: Urology;  Laterality: Left;  . CYSTOSCOPY/RETROGRADE/URETEROSCOPY     1 year ago at [place] in [place]  . CYSTOSCOPY/URETEROSCOPY/HOLMIUM LASER Right 11/24/2017   Procedure: CYSTOSCOPY/URETEROSCOPY/ RETROGRADE/STENT REMOVAL;  Surgeon: Raynelle Bring, MD;  [facility];  Service: Urology;  Laterality: Right;  . prosatectomy     Social History   Tobacco Use  . Smoking status: Former Smoker    Quit date: 12/30/1978    Years since quitting: 40.9  . Smokeless tobacco: Never Used  Substance Use Topics  . Alcohol use: No   family history includes COPD in his father; Cancer in an other family member.  Medications: Current Outpatient Medications  Medication Sig Dispense Refill  . amLODipine (NORVASC) 10 MG tablet Take 10 mg at bedtime by mouth.     Marland Kitchen aspirin EC 81 MG tablet Take 81 mg daily by mouth.    Marland Kitchen atorvastatin (LIPITOR) 20 MG tablet Take 20 mg at bedtime by mouth.     . carvedilol (COREG) 25 MG tablet Take 25 mg 2 (two) times daily by mouth.     . isosorbide mononitrate (IMDUR) 30 MG 24 hr tablet Take 30 mg at bedtime by mouth.     . meloxicam (MOBIC) 15 MG tablet Take 1 tablet (15 mg total) by mouth daily as needed for pain. 30 tablet 0  . omeprazole (PRILOSEC) 20 MG capsule Take 20 mg by mouth 2 (two) times a day.     . predniSONE (DELTASONE) 5 MG tablet Take 5 mg by  mouth daily with breakfast.    . valACYclovir (VALTREX) 1000 MG tablet Take 1 tablet (1,000 mg total) by mouth 2 (two) times daily. 14 tablet 0   No current facility-administered medications for this visit.    No Known Allergies    Discussed warning signs or symptoms. Please see discharge instructions. Patient expresses understanding.  The above documentation has been reviewed and is accurate and complete Lynne Leader

## 2019-12-06 NOTE — Patient Instructions (Signed)
Thank you for coming in today. I am concerned about mortin's neuroma or other nerve issue in the leg.  Let me know in 2 weeks.  If not any better I will order a nerve test in the leg.  We could use gabapentin for nerve pain if needed.

## 2019-12-15 DIAGNOSIS — M5416 Radiculopathy, lumbar region: Secondary | ICD-10-CM | POA: Diagnosis not present

## 2019-12-15 DIAGNOSIS — Z9889 Other specified postprocedural states: Secondary | ICD-10-CM | POA: Diagnosis not present

## 2019-12-15 DIAGNOSIS — M5136 Other intervertebral disc degeneration, lumbar region: Secondary | ICD-10-CM | POA: Diagnosis not present

## 2019-12-15 DIAGNOSIS — Z6833 Body mass index (BMI) 33.0-33.9, adult: Secondary | ICD-10-CM | POA: Diagnosis not present

## 2019-12-15 DIAGNOSIS — M47816 Spondylosis without myelopathy or radiculopathy, lumbar region: Secondary | ICD-10-CM | POA: Diagnosis not present

## 2019-12-15 DIAGNOSIS — M48062 Spinal stenosis, lumbar region with neurogenic claudication: Secondary | ICD-10-CM | POA: Diagnosis not present

## 2019-12-16 ENCOUNTER — Other Ambulatory Visit: Payer: Self-pay | Admitting: Family Medicine

## 2019-12-21 ENCOUNTER — Other Ambulatory Visit: Payer: Self-pay

## 2019-12-22 ENCOUNTER — Encounter: Payer: Self-pay | Admitting: Family Medicine

## 2019-12-22 ENCOUNTER — Ambulatory Visit (INDEPENDENT_AMBULATORY_CARE_PROVIDER_SITE_OTHER): Payer: Medicare Other | Admitting: Family Medicine

## 2019-12-22 VITALS — BP 130/70 | HR 70 | Temp 97.6°F | Ht 71.0 in | Wt 236.8 lb

## 2019-12-22 DIAGNOSIS — H9191 Unspecified hearing loss, right ear: Secondary | ICD-10-CM | POA: Diagnosis not present

## 2019-12-22 DIAGNOSIS — H832X9 Labyrinthine dysfunction, unspecified ear: Secondary | ICD-10-CM

## 2019-12-22 DIAGNOSIS — I10 Essential (primary) hypertension: Secondary | ICD-10-CM

## 2019-12-22 DIAGNOSIS — K219 Gastro-esophageal reflux disease without esophagitis: Secondary | ICD-10-CM | POA: Insufficient documentation

## 2019-12-22 DIAGNOSIS — G250 Essential tremor: Secondary | ICD-10-CM

## 2019-12-22 DIAGNOSIS — M450 Ankylosing spondylitis of multiple sites in spine: Secondary | ICD-10-CM

## 2019-12-22 NOTE — Assessment & Plan Note (Signed)
#  History of peptic ulcer-remains on omeprazole 20 mg twice a day.  He states even with this he recurrently has to take Pepcid a few times a week to control his symptoms-regurgitation and burning sensation noted. -Given history of peptic ulcer as well as poor control of GERD-refer to GI for consideration of endoscopy

## 2019-12-22 NOTE — Assessment & Plan Note (Signed)
#  Ankylosing spondylitis-followed by [facility] rheumatology.  Remains on prednisone 5 mg chronically.  He reports reasonable control as long as he is on prednisone but has had issues try to get off in the past.  He apparently has had a bone density done and appears to be on Reclast though he is not 100% sure of the name he takes a once yearly injection.  He takes vitamin D as well.  Has been off of calcium due to kidney stone history-I asked him to discuss this with his rheumatologist.

## 2019-12-22 NOTE — Assessment & Plan Note (Signed)
History of essential tremor.  Apparently has been evaluated for Parkinson's at the [facility] and was told negative.  Patient is trying to minimize medication and does not want to start medicine at this time

## 2019-12-22 NOTE — Patient Instructions (Addendum)
Health Maintenance Due  Topic Date Due  . Hepatitis C Screening - may have had with [facility] in past- we will consider doing this if we do bloodwork in future 07/15/1946  . COLONOSCOPY -[facility] with [facility] col[facility]ard- have them send me a copy 06/15/1996  . PNA vac Low Risk Adult (2 of 2 - PCV13)-will get from the [place] 05/20/2015   Since  You are having lightheadedness with swelling and more pain in your legs when they are swollen- we opted to decrease amlodipine to 5 mg (so take half tablet of 10mg  pill) and let us know if blood pressures get above 140/90 on average and we can increase back if needed but would prefer lower dose if possible  Stop aspirin 81 mg  We will call you within two weeks about your referral to ENT and GI.  If you do not hear within 3 weeks, give [name] a call.   I think this is a hard decision- but id like input from your [place] rheumatologist on if you should be on calcium with what sounds like reclast- I understand the concern about kidney stones but also want to protect your bones-  Schedule follow up after GI and ENT referrals- likely somewhere 3-6 months

## 2019-12-22 NOTE — Progress Notes (Signed)
Phone [phone] In person visit   Subjective:   Bruce Ball is a 73 y.o. year old very pleasant male patient who presents for/with See problem oriented charting Chief Complaint  Patient presents with  . Follow-up    ROS- No chest pain.sedentary activity- has led to some shortness of breath (thinks would be better with exercise)  No headache or blurry vision.    This visit occurred during the SARS-CoV-2 public health emergency.  Safety protocols were in place, including screening questions prior to the visit, additional usage of staff PPE, and extensive cleaning of exam room while observing appropriate contact time as indicated for disinfecting solutions.   Past Medical History-  Patient Active Problem List   Diagnosis Date Noted  . High risk medication use 02/28/2017    Priority: High  . Ataxia 05/18/2016    Priority: High  . Vestibular disequilibrium 05/18/2016    Priority: High  . Ankylosing spondylitis ([place]) 05/24/2014    Priority: High  . GERD (gastroesophageal reflux disease) 12/22/2019    Priority: Medium  . Essential tremor 12/22/2019    Priority: Medium  . Hyperlipidemia 11/11/2017    Priority: Medium  . Right ureteral stone 11/05/2017    Priority: Medium  . History of prostate cancer 03/08/2017    Priority: Medium  . Hx of chondrosarcoma 02/28/2017    Priority: Medium  . Extrinsic asthma, unspecified 04/07/2009    Priority: Medium  . LOW BACK PAIN 04/21/2008    Priority: Medium  . History of peptic ulcer disease 04/21/2008    Priority: Medium  . Essential hypertension 10/29/2007    Priority: Medium  . Acute pain of right knee 07/15/2019    Priority: Low  . Community acquired pneumonia of right upper lobe of lung 11/05/2017    Priority: Low  . History of total right hip replacement 03/08/2017    Priority: Low  . DDD (degenerative disc disease), thoracic 02/28/2017    Priority: Low  . Peripheral edema 06/08/2012    Priority: Low  . Gross  hematuria 06/14/2019  . Lung nodule, solitary 05/24/2014    Medications- reviewed and updated Current Outpatient Medications  Medication Sig Dispense Refill  . amLODipine (NORVASC) 10 MG tablet Take 5 mg by mouth at bedtime.     Marland Kitchen atorvastatin (LIPITOR) 20 MG tablet Take 20 mg at bedtime by mouth.     . carvedilol (COREG) 25 MG tablet Take 25 mg 2 (two) times daily by mouth.     . Cholecalciferol (VITAMIN D3) 50 MCG (2000 UT) capsule Take 2,000 Units by mouth daily.    . isosorbide mononitrate (IMDUR) 60 MG 24 hr tablet Take 60 mg by mouth at bedtime.     Marland Kitchen omeprazole (PRILOSEC) 20 MG capsule Take 20 mg by mouth 2 (two) times a day.     . predniSONE (DELTASONE) 5 MG tablet Take 5 mg by mouth daily with breakfast.    . valACYclovir (VALTREX) 1000 MG tablet Take 1 tablet (1,000 mg total) by mouth 2 (two) times daily. 14 tablet 0   No current facility-administered medications for this visit.     Objective:  BP 130/70   Pulse 70   Temp 97.6 F (36.4 C)   Ht _0  (1.803 m)   Wt 236 lb 12.8 oz (107.4 kg)   SpO2 96%   BMI 33.03 kg/m  Gen: NAD, resting comfortably CV: RRR no murmurs rubs or gallops Lungs: CTAB no crackles, wheeze, rhonchi Abdomen: soft/nontender/nondistended/normal bowel sounds. No rebound  or guarding.  Ext: trace edema Skin: warm, dry Slight intention tremor    Assessment and Plan  # Wants to discuss Health- would like to discuss Rx's and the health problems he has and would like to develop a plan to go forward.  #Thyroid abnormality/normal TSH recently- upcoming thyroid ultrasound. Not 100% clear why he is getting this but states had prior workup that was reassuring- apparently had biopsies in the past. Apparently had some they could not biopsy previously that they want to take another look at.  He reports some delay in getting [facility]  #Headache/dizziness/vertigo/vestibular disequilibrium S: Patient was seen in the emergency room in mid  November for severe frontal headache and dizziness along with double vision.  CT and MRI during emergency room visit 11/16/2019 largely reassuring.  ESR and CRP were not drawn at that time but they did not seem particular concerned about temporal arteritis.  Patient was having significant improvement by time of our follow-up visit on the 20th.  He had some double vision with the episode and was seeing ophthalmology that afternoon-today he reports headaches resolved once low back pain resolved.  He follows with Dr. Lise Auer and he reports pain resolved with epidural injection.  He did have 2 falls recently but this was prior to his injection-after the injection he has had no falls.  Reports from a vision perspective got a good report from ophthalmology.  His blood pressure was very high when he was experiencing the back pain.  At the time of emergency room visit he was suffering from some dizziness.  He has had ongoing issues with dizziness/vertigo for years.  [facility] and no concrete findings were discovered.  Vestibular disequilibrium was listed by hospitalist approximately 4 years ago and remains on problem list.  He has also had ataxia previously listed.  He mentions-Has tinnitus and hearing loss in right ear.  He thinks that Mnire's was ruled out.  See hypertension section but he also has some orthostatic issues with lightheadedness with standing but this is different than the vertigo/imbalance issues he has had  A/P: headaches resolved fortunately.   Patient has had ongoing issues with vertigo.  Has baseline hearing loss in the right ear along with tinnitus-he would like to get a second opinion from an otolaryngologist outside of the [place] system-he was referred today   # ckd iii S:gfr in 29s or 46s. Short term mobic recently for severe back pain but other than that he avoids NSAIDs A/P: Stable on most recent labs he brings [facility] the [facility]-these will be abstracted in.   Thankful blood pressure is better controlled today and also that lipids are well controlled   #hypertension S: compliant with amlodipine 10 mg, carvedilol 25 mg twice a day, Imdur 60 mg  Also notes pain in feet when legs swell and orthostatic intolerance issues BP Readings from Last 3 Encounters:  12/22/19 130/70  12/06/19 118/72  11/29/19 130/72  A/P: from [facility] "Since  You are having lightheadedness with swelling and more pain in your legs when they are swollen- we opted to decrease amlodipine to 5 mg (so take half tablet of 40m pill) and let [place] if blood pressures get above 140/90 on average and we can increase back if needed but would prefer lower dose if possible"   #hyperlipidemia S: compliant with atorvastatin 20 mg Lab Results  Component Value Date   CHOL 104 11/30/2019   HDL 56 11/30/2019  LDLCALC 38 11/30/2019   LDLDIRECT 145.4 06/27/2011   TRIG 49 11/30/2019   CHOLHDL 4.5 05/18/2016   A/P: Lipids abstracted in by team today [facility] the [facility]-excellent control-continue current medication   #History of peptic ulcer-remains on omeprazole 20 mg twice a day.  He states even with this he recurrently has to take Pepcid a few times a week to control his symptoms-regurgitation and burning sensation noted. -Given history of peptic ulcer as well as poor control of GERD-refer to GI for consideration of endoscopy  #Ankylosing spondylitis-followed by [facility] rheumatology.  Remains on prednisone 5 mg chronically.  He reports reasonable control as long as he is on prednisone but has had issues try to get off in the past.  He apparently has had a bone density done and appears to be on Reclast though he is not 100% sure of the name he takes a once yearly injection.  He takes vitamin D as well.  Has been off of calcium due to kidney stone history-I asked him to discuss this with his rheumatologist.   Recommended follow up: Lungs to follow-up in 3 to 6 months after has seen GI and ENT  Lab/Order  associations:   ICD-10-CM   1. Vestibular disequilibrium, unspecified laterality  H83.2X9 ENT- any  2. Essential hypertension  I10   3. Gastroesophageal reflux disease without esophagitis  K21.9 GI/gastroenterology  4. Hearing loss of right ear, unspecified hearing loss type  H91.91 ENT- any  5. Ankylosing spondylitis of multiple sites in spine ([place])  M45.0   6. Essential tremor  G25.0    Time Stamp The duration of face-to-face time during this visit was greater than 25 minutes. Greater than 50% of this time was spent in counseling, explanation of diagnosis, planning of further management, and/or coordination of care including frustrations with [facility] at times, desire to get further workup, ongoing health concerns and potential treatment, reasons for maintaining and/or adjusting medications.    Return precautions advised.  [name], MD

## 2019-12-27 ENCOUNTER — Encounter: Payer: Self-pay | Admitting: Gastroenterology

## 2019-12-31 DIAGNOSIS — Z86718 Personal history of other venous thrombosis and embolism: Secondary | ICD-10-CM

## 2019-12-31 DIAGNOSIS — Z9889 Other specified postprocedural states: Secondary | ICD-10-CM

## 2019-12-31 DIAGNOSIS — Z86711 Personal history of pulmonary embolism: Secondary | ICD-10-CM

## 2019-12-31 DIAGNOSIS — I2699 Other pulmonary embolism without acute cor pulmonale: Secondary | ICD-10-CM

## 2019-12-31 HISTORY — DX: Personal history of pulmonary embolism: Z86.711

## 2019-12-31 HISTORY — DX: Other specified postprocedural states: Z98.890

## 2019-12-31 HISTORY — DX: Other pulmonary embolism without acute cor pulmonale: I26.99

## 2019-12-31 HISTORY — DX: Personal history of other venous thrombosis and embolism: Z86.718

## 2020-01-06 ENCOUNTER — Encounter: Payer: Self-pay | Admitting: Gastroenterology

## 2020-01-06 ENCOUNTER — Ambulatory Visit (INDEPENDENT_AMBULATORY_CARE_PROVIDER_SITE_OTHER): Payer: Medicare Other | Admitting: Gastroenterology

## 2020-01-06 DIAGNOSIS — R1310 Dysphagia, unspecified: Secondary | ICD-10-CM

## 2020-01-06 DIAGNOSIS — Z1159 Encounter for screening for other viral diseases: Secondary | ICD-10-CM | POA: Diagnosis not present

## 2020-01-06 DIAGNOSIS — K219 Gastro-esophageal reflux disease without esophagitis: Secondary | ICD-10-CM | POA: Diagnosis not present

## 2020-01-06 NOTE — Patient Instructions (Signed)
If you are age 74 or older, your body mass index should be between 23-30. Your Body mass index is 33.77 kg/m. If this is out of the aforementioned range listed, please consider follow up with your Primary Care Provider.  If you are age 34 or younger, your body mass index should be between 19-25. Your Body mass index is 33.77 kg/m. If this is out of the aformentioned range listed, please consider follow up with your Primary Care Provider.   You have been scheduled for an endoscopy. Please follow written instructions given to you at your visit today. If you use inhalers (even only as needed), please bring them with you on the day of your procedure.  INCREASE Omeprazole to 40mg  twice daily.  Due to recent changes in healthcare laws, you may see the results of your imaging and laboratory studies on MyChart before your provider has had a chance to review them.  We understand that in some cases there may be results that are confusing or concerning to you. Not all laboratory results come back in the same time frame and the provider may be waiting for multiple results in order to interpret others.  Please give [name] 48 hours in order for your provider to thoroughly review all the results before contacting the office for clarification of your results.

## 2020-01-06 NOTE — Progress Notes (Signed)
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  He states he has had problems with dysphagia for the past year at least.  This occurs only to solids and does not bother him to liquids.  He will have symptoms of dysphagia every time he eats.  Feels it mostly in his upper chest/throat area.  Denies any odynophagia.  He has some occasional nausea but no vomiting.  He does have frequent pyrosis and reflux with regurgitation that bothers him on a daily basis.  He is taking omeprazole 20 mg twice a day and takes Pepcid 20 mg nightly.  He states he continues to have waterbrash and regurgitation that bothers him despite this regimen.  He denies any abdominal pains.  No changes in his bowel habits.  No blood in his stools.  He does have some cough which he wonders if it is related to his regurgitation and reflux.  He has never had a prior endoscopy.  He denies a family history of esophageal cancer, gastric cancer, colon cancer.  He has had a prior colonoscopy in [facility], he thinks this was anywhere between 5 to 10 years ago and he states it was normal.  He does not have the report on file but is going to get that for [name].  He states he is the [place] perform stool for occult blood for him which has been negative.  No records of that on file today    Past Medical History:  Diagnosis Date  . Chronic back pain greater than 3 months duration   . Chronic leg pain   . Deafness in right ear   . Extrinsic asthma, unspecified 04/07/2009   PT DENIES  . GERD (gastroesophageal reflux disease)   . History of kidney stones   . HYPERTENSION 10/29/2007  . HYPOTENSION 08/13/2010  . LOW BACK PAIN 04/21/2008  . NEPHROLITHIASIS, HX OF 04/21/2008  . PUD, HX OF 04/21/2008  . Renal disorder      Past Surgical History:  Procedure Laterality Date  . bone cancer  1970   rt femur removed and steel rod placed  . CYSTOSCOPY WITH RETROGRADE  PYELOGRAM, URETEROSCOPY AND STENT PLACEMENT Right 11/06/2017   Procedure: CYSTOSCOPY WITH  STENT PLACEMENT RIGHT;  Surgeon: Raynelle Bring, MD;  Location: [facility];  Service: Urology;  Laterality: Right;  . CYSTOSCOPY WITH RETROGRADE PYELOGRAM, URETEROSCOPY AND STENT PLACEMENT Left 06/09/2019   Procedure: CYSTOSCOPY WITH RETROGRADE PYELOGRAM, URETEROSCOPY AND STENT PLACEMENT X2;  Surgeon: [name], MD;  Location: [facility];  Service: Urology;  Laterality: Left;  . CYSTOSCOPY/RETROGRADE/URETEROSCOPY     1 year ago at [place] in [place]  . CYSTOSCOPY/URETEROSCOPY/HOLMIUM LASER Right 11/24/2017   Procedure: CYSTOSCOPY/URETEROSCOPY/ RETROGRADE/STENT REMOVAL;  Surgeon: Raynelle Bring, MD;  Location: [facility];  Service: Urology;  Laterality: Right;  . prosatectomy     Family History  Problem Relation Age of Onset  . COPD Father   . Cancer Other        prostate  . Prostate cancer Brother   . Esophageal cancer Neg Hx   . Stomach cancer Neg Hx   . Pancreatic cancer Neg Hx   . Colon cancer Neg Hx    Social History   Tobacco Use  . Smoking status: Former Smoker    Quit date: 12/30/1978    Years since quitting: 41.0  . Smokeless tobacco: Never Used  Substance Use Topics  .  Alcohol use: No  . Drug use: No   Current Outpatient Medications  Medication Sig Dispense Refill  . amLODipine (NORVASC) 10 MG tablet Take 5 mg by mouth at bedtime.     Marland Kitchen atorvastatin (LIPITOR) 20 MG tablet Take 20 mg at bedtime by mouth.     . carvedilol (COREG) 25 MG tablet Take 25 mg 2 (two) times daily by mouth.     . Cholecalciferol (VITAMIN D3) 50 MCG (2000 UT) capsule Take 2,000 Units by mouth daily.    . isosorbide mononitrate (IMDUR) 60 MG 24 hr tablet Take 60 mg by mouth at bedtime.     Marland Kitchen omeprazole (PRILOSEC) 20 MG capsule Take 20 mg by mouth 2 (two) times a day.     . predniSONE (DELTASONE) 5 MG tablet Take 5 mg by mouth daily with breakfast.    . valACYclovir (VALTREX) 1000 MG tablet Take 1 tablet (1,000 mg  total) by mouth 2 (two) times daily. 14 tablet 0   No current facility-administered medications for this visit.   No Known Allergies   Review of Systems: All systems reviewed and negative except where noted in HPI.   Lab Results  Component Value Date   WBC 13.8 11/30/2019   HGB 16.5 11/30/2019   HCT 50 11/30/2019   MCV 89.5 11/16/2019   PLT 222 11/30/2019    Lab Results  Component Value Date   CREATININE 1.5 (A) 11/30/2019   BUN 30 (A) 11/30/2019   NA 144 11/30/2019   K 4.8 11/30/2019   CL 110 (A) 11/30/2019   CO2 30 (A) 11/30/2019      Physical Exam: BP (!) 144/80 (BP Location: Left Arm, Patient Position: Sitting, Cuff Size: Large)   Pulse 73   Temp (!) 97.4 F (36.3 C)   Ht 5\' 11"  (1.803 m)   Wt 242 lb 2 oz (109.8 kg)   SpO2 96%   BMI 33.77 kg/m  Constitutional: Pleasant,well-developed, male in no acute distress. HEENT: Normocephalic and atraumatic. Conjunctivae are normal. No scleral icterus. Neck supple.  Cardiovascular: Normal rate, regular rhythm.  Pulmonary/chest: Effort normal and breath sounds normal. No wheezing, rales or rhonchi. Abdominal: Soft, nondistended, nontender. . There are no masses palpable. No hepatomegaly. Extremities: no edema Lymphadenopathy: No cervical adenopathy noted. Neurological: Alert and oriented to person place and time. Skin: Skin is warm and dry. No rashes noted. Psychiatric: Normal mood and affect. Behavior is normal.   ASSESSMENT AND PLAN: 74 year old male here for new patient assessment of the following:  Dysphagia / GERD - ongoing symptoms of reflux fairly frequently despite omeprazole 20 mg twice a day and Pepcid nightly.  He also has frequent dysphagia to solids which has been persistent over time.  I discussed differential diagnosis with him.  Is quite possible he has a peptic stricture causing this in relation to his ongoing reflux symptoms however need to rule out mass lesion, large hiatal hernia, rule out  Barrett's in light of chronic reflux, etc.  I am recommending an upper endoscopy +/- dilation pending findings.  I discussed endoscopy and anesthesia with him, risks and benefits, and he wanted to proceed.  In the interim I recommend he increase his omeprazole to 40 mg twice a day for a few weeks and see if that can provide any better control of his reflux symptoms.  He agreed with the plan, further recommendations pending results of this exam and his course  I otherwise have requested records from his last colonoscopy from the [place]  to clarify findings and see if he warrants any further colon cancer screening given his age.  I spent 45 minutes of time, including in depth chart review, independent review of results as outlined above, communicating results with the patient directly, face-to-face time with the patient, coordinating care, and ordering studies and medications as appropriate. Greater than 50% of the time was spent counseling and coordinating care.    Tolna Cellar, MD [facility]  CC: [name], MD

## 2020-01-07 ENCOUNTER — Ambulatory Visit (INDEPENDENT_AMBULATORY_CARE_PROVIDER_SITE_OTHER): Payer: Medicare Other

## 2020-01-07 DIAGNOSIS — Z1159 Encounter for screening for other viral diseases: Secondary | ICD-10-CM

## 2020-01-10 LAB — SARS CORONAVIRUS 2 (TAT 6-24 HRS): SARS Coronavirus 2: NEGATIVE

## 2020-01-11 ENCOUNTER — Encounter: Payer: Self-pay | Admitting: Gastroenterology

## 2020-01-11 ENCOUNTER — Ambulatory Visit (AMBULATORY_SURGERY_CENTER): Payer: Medicare Other | Admitting: Gastroenterology

## 2020-01-11 ENCOUNTER — Other Ambulatory Visit: Payer: Self-pay

## 2020-01-11 VITALS — BP 134/78 | HR 74 | Temp 98.4°F | Resp 19 | Ht 71.0 in | Wt 242.0 lb

## 2020-01-11 DIAGNOSIS — R1319 Other dysphagia: Secondary | ICD-10-CM

## 2020-01-11 DIAGNOSIS — K449 Diaphragmatic hernia without obstruction or gangrene: Secondary | ICD-10-CM | POA: Diagnosis not present

## 2020-01-11 DIAGNOSIS — R1310 Dysphagia, unspecified: Secondary | ICD-10-CM | POA: Diagnosis not present

## 2020-01-11 DIAGNOSIS — K317 Polyp of stomach and duodenum: Secondary | ICD-10-CM | POA: Diagnosis not present

## 2020-01-11 DIAGNOSIS — K219 Gastro-esophageal reflux disease without esophagitis: Secondary | ICD-10-CM | POA: Diagnosis not present

## 2020-01-11 MED ORDER — SODIUM CHLORIDE 0.9 % IV SOLN: 500.0000 mL | Freq: Once | INTRAVENOUS | Status: DC

## 2020-01-11 NOTE — Patient Instructions (Signed)
Information on hiatal hernias and esophagitis given to you today.  Await pathology results.  Continue your Omeprazole 40 mg twice a day.  Post dilation diet. Clear liquids until 4:45pm today.  Soft diet the rest of today.  Tomorrow you may resume your regular diet.  YOU HAD AN ENDOSCOPIC PROCEDURE TODAY AT [facility]:   Refer to the procedure report that was given to you for any specific questions about what was found during the examination.  If the procedure report does not answer your questions, please call your gastroenterologist to clarify.  If you requested that your care partner not be given the details of your procedure findings, then the procedure report has been included in a sealed envelope for you to review at your convenience later.  YOU SHOULD EXPECT: Some feelings of bloating in the abdomen. Passage of more gas than usual.  Walking can help get rid of the air that was put into your GI tract during the procedure and reduce the bloating. If you had a lower endoscopy (such as a colonoscopy or flexible sigmoidoscopy) you may notice spotting of blood in your stool or on the toilet paper. If you underwent a bowel prep for your procedure, you may not have a normal bowel movement for a few days.  Please Note:  You might notice some irritation and congestion in your nose or some drainage.  This is from the oxygen used during your procedure.  There is no need for concern and it should clear up in a day or so.  SYMPTOMS TO REPORT IMMEDIATELY:    Following upper endoscopy (EGD)  Vomiting of blood or coffee ground material  New chest pain or pain under the shoulder blades  Painful or persistently difficult swallowing  New shortness of breath  Fever of 100F or higher  Black, tarry-looking stools  For urgent or emergent issues, a gastroenterologist can be reached at any hour by calling [phone].   DIET:  We do recommend a small meal at first, but then you may  proceed to your regular diet.  Drink plenty of fluids but you should avoid alcoholic beverages for 24 hours.  ACTIVITY:  You should plan to take it easy for the rest of today and you should NOT DRIVE or use heavy machinery until tomorrow (because of the sedation medicines used during the test).    FOLLOW UP: Our staff will call the number listed on your records 48-72 hours following your procedure to check on you and address any questions or concerns that you may have regarding the information given to you following your procedure. If we do not reach you, we will leave a message.  We will attempt to reach you two times.  During this call, we will ask if you have developed any symptoms of COVID 19. If you develop any symptoms (ie: fever, flu-like symptoms, shortness of breath, cough etc.) before then, please call [phone].  If you test positive for Covid 19 in the 2 weeks post procedure, please call and report this information to [name].    If any biopsies were taken you will be contacted by phone or by letter within the next 1-3 weeks.  Please call us at [phone] if you have not heard about the biopsies in 3 weeks.    SIGNATURES/CONFIDENTIALITY: You and/or your care partner have signed paperwork which will be entered into your electronic medical record.  These signatures attest to the fact that that the information above on  your After Visit Summary has been reviewed and is understood.  Full responsibility of the confidentiality of this discharge information lies with you and/or your care-partner.

## 2020-01-11 NOTE — Progress Notes (Signed)
Called to room to assist during endoscopic procedure.  Patient ID and intended procedure confirmed with present staff. Received instructions for my participation in the procedure from the performing physician.  

## 2020-01-11 NOTE — Progress Notes (Signed)
Pt's states no medical or surgical changes since previsit or office visit. 

## 2020-01-11 NOTE — Op Note (Signed)
[place] Patient Name: Bruce Ball Procedure Date: 01/11/2020 2:59 PM MRN: [id number] Endoscopist: Remo Lipps P. Havery Moros , MD Age: 74 Referring MD:  Date of Birth: [birth year] Gender: Male Account #: [id number] Procedure:                Upper GI endoscopy Indications:              Dysphagia to solids, worsening gastro-esophageal                            reflux disease on low dose omeprazole, now on 40mg                             twice daily with some improvement Medicines:                Monitored Anesthesia Care Procedure:                Pre-Anesthesia Assessment:                           - Prior to the procedure, a History and Physical                            was performed, and patient medications and                            allergies were reviewed. The patient's tolerance of                            previous anesthesia was also reviewed. The risks                            and benefits of the procedure and the sedation                            options and risks were discussed with the patient.                            All questions were answered, and informed consent                            was obtained. Prior Anticoagulants: The patient has                            taken no previous anticoagulant or antiplatelet                            agents. ASA Grade Assessment: II - A patient with                            mild systemic disease. After reviewing the risks                            and benefits, the patient was deemed in  satisfactory condition to undergo the procedure.                           After obtaining informed consent, the endoscope was                            passed under direct vision. Throughout the                            procedure, the patient's blood pressure, pulse, and                            oxygen saturations were monitored continuously. The                            Endoscope was  introduced through the mouth, and                            advanced to the second part of duodenum. The upper                            GI endoscopy was accomplished without difficulty.                            The patient tolerated the procedure well. Scope In: Scope Out: Findings:                 Esophagogastric landmarks were identified: the                            Z-line was found at 41 cm, the gastroesophageal                            junction was found at 41 cm and the upper extent of                            the gastric folds was found at 43 cm from the                            incisors.                           A 2 cm hiatal hernia was present.                           The exam of the esophagus was otherwise normal. No                            esophagitis / Barrett's. No obvious stenosis /                            stricture.                           A guidewire was placed  and the scope was withdrawn.                            Empiric dilation was performed in the entire                            esophagus with a 60mm Savary dilator with mild to                            moderate resistance at the posterior pharynx UES.                            Relook endoscopy showed no mucosal wrents.                           Multiple small benign appearing sessile polyps were                            found in the gastric body. Biopsies were taken with                            a cold forceps for histology from a few polyps as a                            representative sample.                           The exam of the stomach was otherwise normal.                           The duodenal bulb and second portion of the                            duodenum were normal. Complications:            No immediate complications. Estimated blood loss:                            Minimal. Estimated Blood Loss:     Estimated blood loss was minimal. Impression:               -  Esophagogastric landmarks identified.                           - 2 cm hiatal hernia.                           - Normal esophagus otherwise - no esophagitis or                            focal stenosis. Empiric dilation performed to 64mm                            as outlined for dysphagia.                           -  Multiple benign appearing gastric polyps, suspect                            fundic gland polyps in the setting of ongoing PPI                            use. Biopsied.                           - Normal stomach otherwise                           - Normal duodenal bulb and second portion of the                            duodenum. Recommendation:           - Patient has a contact number available for                            emergencies. The signs and symptoms of potential                            delayed complications were discussed with the                            patient. Return to normal activities tomorrow.                            Written discharge instructions were provided to the                            patient.                           - Resume previous diet.                           - Continue present medications including continued                            trial of higher dose omeprazole 40mg  twice daily                           - Await pathology results and course post dilation.                            If dysphagia persists despite dilation,                            consideration for barium swallow or manometry                            testing Remo Lipps P. Clifford Benninger, MD 01/11/2020 3:33:08 PM This report has been signed electronically.

## 2020-01-13 ENCOUNTER — Emergency Department (HOSPITAL_COMMUNITY): Payer: Medicare Other

## 2020-01-13 ENCOUNTER — Telehealth: Payer: Self-pay

## 2020-01-13 ENCOUNTER — Encounter (HOSPITAL_COMMUNITY): Payer: Self-pay | Admitting: Emergency Medicine

## 2020-01-13 ENCOUNTER — Telehealth: Payer: Self-pay | Admitting: *Deleted

## 2020-01-13 ENCOUNTER — Inpatient Hospital Stay (HOSPITAL_COMMUNITY)
Admission: EM | Admit: 2020-01-13 | Discharge: 2020-01-16 | DRG: 176 | Disposition: A | Discharge status: Home / Self Care | Payer: Medicare Other | Attending: Internal Medicine | Admitting: Internal Medicine

## 2020-01-13 ENCOUNTER — Other Ambulatory Visit: Payer: Self-pay

## 2020-01-13 DIAGNOSIS — I2699 Other pulmonary embolism without acute cor pulmonale: Principal | ICD-10-CM | POA: Diagnosis present

## 2020-01-13 DIAGNOSIS — K219 Gastro-esophageal reflux disease without esophagitis: Secondary | ICD-10-CM | POA: Diagnosis present

## 2020-01-13 DIAGNOSIS — Z8546 Personal history of malignant neoplasm of prostate: Secondary | ICD-10-CM

## 2020-01-13 DIAGNOSIS — R0602 Shortness of breath: Secondary | ICD-10-CM | POA: Diagnosis not present

## 2020-01-13 DIAGNOSIS — M7989 Other specified soft tissue disorders: Secondary | ICD-10-CM | POA: Diagnosis not present

## 2020-01-13 DIAGNOSIS — I10 Essential (primary) hypertension: Secondary | ICD-10-CM | POA: Diagnosis not present

## 2020-01-13 DIAGNOSIS — Z20822 Contact with and (suspected) exposure to covid-19: Secondary | ICD-10-CM | POA: Diagnosis present

## 2020-01-13 DIAGNOSIS — M25472 Effusion, left ankle: Secondary | ICD-10-CM | POA: Diagnosis present

## 2020-01-13 DIAGNOSIS — Z8711 Personal history of peptic ulcer disease: Secondary | ICD-10-CM

## 2020-01-13 DIAGNOSIS — Z87891 Personal history of nicotine dependence: Secondary | ICD-10-CM

## 2020-01-13 DIAGNOSIS — Z87442 Personal history of urinary calculi: Secondary | ICD-10-CM

## 2020-01-13 DIAGNOSIS — Z7952 Long term (current) use of systemic steroids: Secondary | ICD-10-CM

## 2020-01-13 DIAGNOSIS — M545 Low back pain: Secondary | ICD-10-CM | POA: Diagnosis present

## 2020-01-13 DIAGNOSIS — M452 Ankylosing spondylitis of cervical region: Secondary | ICD-10-CM | POA: Diagnosis present

## 2020-01-13 DIAGNOSIS — J45909 Unspecified asthma, uncomplicated: Secondary | ICD-10-CM | POA: Diagnosis present

## 2020-01-13 DIAGNOSIS — M79606 Pain in leg, unspecified: Secondary | ICD-10-CM | POA: Diagnosis present

## 2020-01-13 DIAGNOSIS — Z85830 Personal history of malignant neoplasm of bone: Secondary | ICD-10-CM

## 2020-01-13 DIAGNOSIS — G8929 Other chronic pain: Secondary | ICD-10-CM | POA: Diagnosis present

## 2020-01-13 DIAGNOSIS — Z79899 Other long term (current) drug therapy: Secondary | ICD-10-CM

## 2020-01-13 DIAGNOSIS — M459 Ankylosing spondylitis of unspecified sites in spine: Secondary | ICD-10-CM | POA: Diagnosis present

## 2020-01-13 DIAGNOSIS — Z825 Family history of asthma and other chronic lower respiratory diseases: Secondary | ICD-10-CM

## 2020-01-13 DIAGNOSIS — I82452 Acute embolism and thrombosis of left peroneal vein: Secondary | ICD-10-CM | POA: Diagnosis present

## 2020-01-13 DIAGNOSIS — E785 Hyperlipidemia, unspecified: Secondary | ICD-10-CM | POA: Diagnosis present

## 2020-01-13 DIAGNOSIS — Z8701 Personal history of pneumonia (recurrent): Secondary | ICD-10-CM

## 2020-01-13 DIAGNOSIS — H9191 Unspecified hearing loss, right ear: Secondary | ICD-10-CM | POA: Diagnosis present

## 2020-01-13 LAB — COMPREHENSIVE METABOLIC PANEL
ALT: 25 U/L (ref 0–44)
AST: 19 U/L (ref 15–41)
Albumin: 3.6 g/dL (ref 3.5–5.0)
Alkaline Phosphatase: 67 U/L (ref 38–126)
Anion gap: 7 (ref 5–15)
BUN: 21 mg/dL (ref 8–23)
CO2: 26 mmol/L (ref 22–32)
Calcium: 8.6 mg/dL — ABNORMAL LOW (ref 8.9–10.3)
Chloride: 107 mmol/L (ref 98–111)
Creatinine, Ser: 1.48 mg/dL — ABNORMAL HIGH (ref 0.61–1.24)
GFR calc Af Amer: 54 mL/min — ABNORMAL LOW (ref >60)
GFR calc non Af Amer: 46 mL/min — ABNORMAL LOW (ref >60)
Glucose, Bld: 102 mg/dL — ABNORMAL HIGH (ref 70–99)
Potassium: 4.1 mmol/L (ref 3.5–5.1)
Sodium: 140 mmol/L (ref 135–145)
Total Bilirubin: 1.2 mg/dL (ref 0.3–1.2)
Total Protein: 6.5 g/dL (ref 6.5–8.1)

## 2020-01-13 LAB — I-STAT CHEM 8, ED
BUN: 21 mg/dL (ref 8–23)
BUN: 24 mg/dL — ABNORMAL HIGH (ref 8–23)
Calcium, Ion: 0.85 mmol/L — CL (ref 1.15–1.40)
Calcium, Ion: 1.13 mmol/L — ABNORMAL LOW (ref 1.15–1.40)
Chloride: 103 mmol/L (ref 98–111)
Chloride: 104 mmol/L (ref 98–111)
Creatinine, Ser: 1.5 mg/dL — ABNORMAL HIGH (ref 0.61–1.24)
Creatinine, Ser: 1.5 mg/dL — ABNORMAL HIGH (ref 0.61–1.24)
Glucose, Bld: 92 mg/dL (ref 70–99)
Glucose, Bld: 95 mg/dL (ref 70–99)
HCT: 46 % (ref 39.0–52.0)
HCT: 46 % (ref 39.0–52.0)
Hemoglobin: 15.6 g/dL (ref 13.0–17.0)
Hemoglobin: 15.6 g/dL (ref 13.0–17.0)
Potassium: 4 mmol/L (ref 3.5–5.1)
Potassium: 4.1 mmol/L (ref 3.5–5.1)
Sodium: 140 mmol/L (ref 135–145)
Sodium: 141 mmol/L (ref 135–145)
TCO2: 30 mmol/L (ref 22–32)
TCO2: 31 mmol/L (ref 22–32)

## 2020-01-13 LAB — CBC WITH DIFFERENTIAL/PLATELET
Abs Immature Granulocytes: 0.09 10*3/uL — ABNORMAL HIGH (ref 0.00–0.07)
Basophils Absolute: 0 10*3/uL (ref 0.0–0.1)
Basophils Relative: 0 %
Eosinophils Absolute: 0.1 10*3/uL (ref 0.0–0.5)
Eosinophils Relative: 1 %
HCT: 49.8 % (ref 39.0–52.0)
Hemoglobin: 16.2 g/dL (ref 13.0–17.0)
Immature Granulocytes: 1 %
Lymphocytes Relative: 13 %
Lymphs Abs: 1.3 10*3/uL (ref 0.7–4.0)
MCH: 29.2 pg (ref 26.0–34.0)
MCHC: 32.5 g/dL (ref 30.0–36.0)
MCV: 89.9 fL (ref 80.0–100.0)
Monocytes Absolute: 0.9 10*3/uL (ref 0.1–1.0)
Monocytes Relative: 9 %
Neutro Abs: 7.5 10*3/uL (ref 1.7–7.7)
Neutrophils Relative %: 76 %
Platelets: 172 10*3/uL (ref 150–400)
RBC: 5.54 MIL/uL (ref 4.22–5.81)
RDW: 14.4 % (ref 11.5–15.5)
WBC: 9.9 10*3/uL (ref 4.0–10.5)
nRBC: 0 % (ref 0.0–0.2)

## 2020-01-13 LAB — URINALYSIS, ROUTINE W REFLEX MICROSCOPIC
Bacteria, UA: NONE SEEN
Bilirubin Urine: NEGATIVE
Glucose, UA: NEGATIVE mg/dL
Ketones, ur: NEGATIVE mg/dL
Leukocytes,Ua: NEGATIVE
Nitrite: NEGATIVE
Protein, ur: NEGATIVE mg/dL
Specific Gravity, Urine: 1.02 (ref 1.005–1.030)
pH: 5 (ref 5.0–8.0)

## 2020-01-13 LAB — RESPIRATORY PANEL BY RT PCR (FLU A&B, COVID)
Influenza A by PCR: NEGATIVE
Influenza B by PCR: NEGATIVE
SARS Coronavirus 2 by RT PCR: NEGATIVE

## 2020-01-13 LAB — BRAIN NATRIURETIC PEPTIDE: B Natriuretic Peptide: 42.8 pg/mL (ref 0.0–100.0)

## 2020-01-13 LAB — TROPONIN I (HIGH SENSITIVITY)
Troponin I (High Sensitivity): 19 ng/L — ABNORMAL HIGH (ref <18)
Troponin I (High Sensitivity): 18 ng/L — ABNORMAL HIGH (ref <18)

## 2020-01-13 LAB — LIPASE, BLOOD: Lipase: 37 U/L (ref 11–51)

## 2020-01-13 LAB — D-DIMER, QUANTITATIVE: D-Dimer, Quant: 11.5 ug/mL-FEU — ABNORMAL HIGH (ref 0.00–0.50)

## 2020-01-13 LAB — LACTIC ACID, PLASMA
Lactic Acid, Venous: 0.9 mmol/L (ref 0.5–1.9)
Lactic Acid, Venous: 1.3 mmol/L (ref 0.5–1.9)

## 2020-01-13 LAB — PROTIME-INR
INR: 1.1 (ref 0.8–1.2)
Prothrombin Time: 13.7 seconds (ref 11.4–15.2)

## 2020-01-13 MED ORDER — CARVEDILOL 12.5 MG PO TABS
25.0000 mg | ORAL_TABLET | Freq: Two times a day (BID) | ORAL | Status: DC
  Administered 2020-01-13 – 2020-01-16 (×6): 25 mg via ORAL
  Filled 2020-01-13: qty 1
  Filled 2020-01-13 (×2): qty 2
  Filled 2020-01-13: qty 1
  Filled 2020-01-13 (×2): qty 2
  Filled 2020-01-13: qty 1

## 2020-01-13 MED ORDER — HEPARIN BOLUS VIA INFUSION
3000.0000 [IU] | Freq: Once | INTRAVENOUS | Status: AC
  Administered 2020-01-14: 01:00:00 3000 [IU] via INTRAVENOUS
  Filled 2020-01-13: qty 3000

## 2020-01-13 MED ORDER — HEPARIN (PORCINE) 25000 UT/250ML-% IV SOLN
1600.0000 [IU]/h | INTRAVENOUS | Status: DC
  Administered 2020-01-14: 01:00:00 1600 [IU]/h via INTRAVENOUS
  Filled 2020-01-13: qty 250

## 2020-01-13 MED ORDER — PANTOPRAZOLE SODIUM 40 MG IV SOLR
40.0000 mg | Freq: Once | INTRAVENOUS | Status: AC
  Administered 2020-01-13: 40 mg via INTRAVENOUS
  Filled 2020-01-13: qty 40

## 2020-01-13 MED ORDER — IOHEXOL 350 MG/ML SOLN
80.0000 mL | Freq: Once | INTRAVENOUS | Status: AC | PRN
  Administered 2020-01-13: 23:00:00 80 mL via INTRAVENOUS

## 2020-01-13 MED ORDER — AMLODIPINE BESYLATE 5 MG PO TABS
10.0000 mg | ORAL_TABLET | Freq: Once | ORAL | Status: AC
  Administered 2020-01-13: 10 mg via ORAL
  Filled 2020-01-13: qty 2

## 2020-01-13 MED ORDER — SODIUM CHLORIDE (PF) 0.9 % IJ SOLN
INTRAMUSCULAR | Status: AC
  Administered 2020-01-13: 10 mL
  Filled 2020-01-13: qty 50

## 2020-01-13 NOTE — ED Provider Notes (Addendum)
[facility] DEPT Provider Note   CSN: [id number] Arrival date & time: 01/13/20  1536     History Chief Complaint  Patient presents with  . Shortness of Breath    Bruce Ball is a 74 y.o. male.  HPI Patient had upper endoscopy with esophageal dilation 2 days ago.  He reports he felt fine after the procedure and was doing well.  This morning at 3 AM he got up to go to the bathroom and then noted that he was very short of breath.  He reports that this persisted and has not improved throughout the course of the day.  Minor activity such as walking across his house is making him very short of breath.  He denies is worse lying flat.  He does report he has been coughing.  He states has been going on for couple weeks.  He reports he has severe reflux and had an episode of reflux a couple weeks ago and is pretty sure that some of his stomach acid went into his lungs.  He was just waiting for this to get better.  He denies he ever develops any fever or chills or myalgias.  We examined his legs and notes that there is some swelling of both ankles.  He reports that his not typical for him to have that much swelling.  He denies specific pain behind the calves.    Past Medical History:  Diagnosis Date  . Chronic back pain greater than 3 months duration   . Chronic leg pain   . Deafness in right ear   . Extrinsic asthma, unspecified 04/07/2009   PT DENIES  . GERD (gastroesophageal reflux disease)   . History of kidney stones   . HYPERTENSION 10/29/2007  . HYPOTENSION 08/13/2010  . LOW BACK PAIN 04/21/2008  . NEPHROLITHIASIS, HX OF 04/21/2008  . PUD, HX OF 04/21/2008  . Renal disorder     Patient Active Problem List   Diagnosis Date Noted  . GERD (gastroesophageal reflux disease) 12/22/2019  . Essential tremor 12/22/2019  . Acute pain of right knee 07/15/2019  . Gross hematuria 06/14/2019  . Hyperlipidemia 11/11/2017  . Community acquired pneumonia of right  upper lobe of lung 11/05/2017  . Right ureteral stone 11/05/2017  . History of total right hip replacement 03/08/2017  . History of prostate cancer 03/08/2017  . 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 High risk medication use 02/28/2017  . DDD (degenerative disc disease), thoracic 02/28/2017  . Ataxia 05/18/2016  . Vestibular disequilibrium 05/18/2016  . Ankylosing spondylitis ([place]) 05/24/2014  . Lung nodule, solitary 05/24/2014  . Peripheral edema 06/08/2012  . Extrinsic asthma, unspecified 04/07/2009  . LOW BACK PAIN 04/21/2008  . History of peptic ulcer disease 04/21/2008  . Essential hypertension 10/29/2007    Past Surgical History:  Procedure Laterality Date  . bone cancer  1970   rt femur removed and steel rod placed  . CYSTOSCOPY WITH RETROGRADE PYELOGRAM, URETEROSCOPY AND STENT PLACEMENT Right 11/06/2017   Procedure: CYSTOSCOPY WITH  STENT PLACEMENT RIGHT;  Surgeon: Raynelle Bring, MD;  Location: [facility];  Service: Urology;  Laterality: Right;  . CYSTOSCOPY WITH RETROGRADE PYELOGRAM, URETEROSCOPY AND STENT PLACEMENT Left 06/09/2019   Procedure: CYSTOSCOPY WITH RETROGRADE PYELOGRAM, URETEROSCOPY AND STENT PLACEMENT X2;  Surgeon: [name], MD;  Location: [facility];  Service: Urology;  Laterality: Left;  . CYSTOSCOPY/RETROGRADE/URETEROSCOPY     1 year ago at [place] in [place]  . CYSTOSCOPY/URETEROSCOPY/HOLMIUM LASER Right 11/24/2017   Procedure:  CYSTOSCOPY/URETEROSCOPY/ RETROGRADE/STENT REMOVAL;  Surgeon: Raynelle Bring, MD;  Location: [facility];  Service: Urology;  Laterality: Right;  . prosatectomy         Family History  Problem Relation Age of Onset  . COPD Father   . Cancer Other        prostate  . Prostate cancer Brother   . Esophageal cancer Neg Hx   . Stomach cancer Neg Hx   . Pancreatic cancer Neg Hx   . Colon cancer Neg Hx     Social History   Tobacco Use  . Smoking status: Former Smoker    Quit date: 12/30/1978    Years since quitting: 41.0  . Smokeless  tobacco: Never Used  Substance Use Topics  . Alcohol use: No  . Drug use: No    Home Medications Prior to Admission medications   Medication Sig Start Date End Date Taking? Authorizing Provider  amLODipine (NORVASC) 10 MG tablet Take 5 mg by mouth at bedtime.    Yes [provider]  atorvastatin (LIPITOR) 20 MG tablet Take 20 mg at bedtime by mouth.    Yes [provider]  carvedilol (COREG) 25 MG tablet Take 25 mg 2 (two) times daily by mouth.    Yes [provider]  Cholecalciferol (VITAMIN D3) 50 MCG (2000 UT) capsule Take 2,000 Units by mouth daily.   Yes [provider]  isosorbide mononitrate (IMDUR) 60 MG 24 hr tablet Take 60 mg by mouth at bedtime.    Yes [provider]  omeprazole (PRILOSEC) 40 MG capsule Take 40 mg by mouth 2 (two) times daily.   Yes [provider]  predniSONE (DELTASONE) 5 MG tablet Take 5 mg by mouth daily with breakfast.   Yes [provider]  valACYclovir (VALTREX) 1000 MG tablet Take 1 tablet (1,000 mg total) by mouth 2 (two) times daily. Patient not taking: Reported on 01/13/2020 11/29/19   Gregor Hams, MD    Allergies    Patient has no known allergies.  Review of Systems   Review of Systems 10 Systems reviewed and are negative for acute change except as noted in the HPI. Physical Exam Updated Vital Signs BP (!) 165/93   Pulse 76   Temp 98.3 F (36.8 C) (Oral)   Resp 19   SpO2 97%   Physical Exam Constitutional:      Comments: Patient is alert and appropriate.  Speaking full sentences.  He does not have significant respiratory distress at rest but minor activity such as getting changed out of his garments makes him short of breath.  HENT:     Head: Normocephalic and atraumatic.  Eyes:     Extraocular Movements: Extraocular movements intact.  Cardiovascular:     Rate and Rhythm: Normal rate and regular rhythm.  Pulmonary:     Comments: At rest patient is not significantly  short of breath but with minimal activity he becomes dyspneic.  Occasional cough paroxysmal with deep inspiration.  Lungs are grossly clear. Abdominal:     General: There is no distension.     Palpations: Abdomen is soft.     Tenderness: There is no abdominal tenderness. There is no guarding.  Musculoskeletal:     Comments: 2+ pitting edema bilateral lower legs.  Skin condition of feet and lower legs is good without wounds.  Skin:    General: Skin is warm and dry.  Neurological:     General: No focal deficit present.     Mental  Status: He is oriented to person, place, and time.     Coordination: Coordination normal.  Psychiatric:        Mood and Affect: Mood normal.     ED Results / Procedures / Treatments   Labs (all labs ordered are listed, but only abnormal results are displayed) Labs Reviewed  COMPREHENSIVE METABOLIC PANEL - Abnormal; Notable for the following components:      Result Value   Glucose, Bld 102 (*)    Creatinine, Ser 1.48 (*)    Calcium 8.6 (*)    GFR calc non Af Amer 46 (*)    GFR calc Af Amer 54 (*)    All other components within normal limits  CBC WITH DIFFERENTIAL/PLATELET - Abnormal; Notable for the following components:   Abs Immature Granulocytes 0.09 (*)    All other components within normal limits  D-DIMER, QUANTITATIVE (NOT AT [facility]) - Abnormal; Notable for the following components:   D-Dimer, Quant 11.50 (*)    All other components within normal limits  URINALYSIS, ROUTINE W REFLEX MICROSCOPIC - Abnormal; Notable for the following components:   Hgb urine dipstick SMALL (*)    All other components within normal limits  I-STAT CHEM 8, ED - Abnormal; Notable for the following components:   Creatinine, Ser 1.50 (*)    Calcium, Ion 0.85 (*)    All other components within normal limits  I-STAT CHEM 8, ED - Abnormal; Notable for the following components:   BUN 24 (*)    Creatinine, Ser 1.50 (*)    Calcium, Ion 1.13 (*)    All other components within  normal limits  TROPONIN I (HIGH SENSITIVITY) - Abnormal; Notable for the following components:   Troponin I (High Sensitivity) 19 (*)    All other components within normal limits  TROPONIN I (HIGH SENSITIVITY) - Abnormal; Notable for the following components:   Troponin I (High Sensitivity) 18 (*)    All other components within normal limits  RESPIRATORY PANEL BY RT PCR (FLU A&B, COVID)  LIPASE, BLOOD  BRAIN NATRIURETIC PEPTIDE  LACTIC ACID, PLASMA  LACTIC ACID, PLASMA  PROTIME-INR  HEPARIN LEVEL (UNFRACTIONATED)    EKG EKG Interpretation  Date/Time:  Thursday January 13 2020 15:43:50 EST Ventricular Rate:  79 PR Interval:  170 QRS Duration: 78 QT Interval:  336 QTC Calculation: 385 R Axis:   43 Text Interpretation: Normal sinus rhythm Normal ECG no change from previous Confirmed by Charlesetta Shanks [phone]) on 01/13/2020 7:46:10 PM   Radiology DG Chest 2 View  Result Date: 01/13/2020 CLINICAL DATA:  Shortness of breath EXAM: CHEST - 2 VIEW COMPARISON:  Radiograph 12/15/2018 FINDINGS: Stable calcified granuloma in the left mid lung. Chronically coarsened interstitial changes most pronounced in the left base are similar to prior. No consolidation, features of edema, pneumothorax, or effusion. The cardiomediastinal contours are unremarkable. No acute osseous or soft tissue abnormality. Degenerative changes are present in the imaged spine and shoulders. IMPRESSION: No acute cardiopulmonary abnormality. Electronically Signed   By: Lovena Le M.D.   On: 01/13/2020 16:55   CT Angio Chest PE W/Cm &/Or Wo Cm  Result Date: 01/13/2020 CLINICAL DATA:  Elevated D-dimer. EXAM: CT ANGIOGRAPHY CHEST WITH CONTRAST TECHNIQUE: Multidetector CT imaging of the chest was performed using the standard protocol during bolus administration of intravenous contrast. Multiplanar CT image reconstructions and MIPs were obtained to evaluate the vascular anatomy. CONTRAST:  70mL OMNIPAQUE IOHEXOL 350 MG/ML SOLN  COMPARISON:  06/01/2014 FINDINGS: Cardiovascular: There are bilateral pulmonary emboli  in all lobes of both lungs. RV/LV ratio slightly elevated at 1.06. Heart is normal size. Aorta is normal caliber. Scattered coronary artery and aortic calcifications. Mediastinum/Nodes: No mediastinal, hilar, or axillary adenopathy. Calcified mediastinal and left hilar lymph nodes present. Lungs/Pleura: Calcified granuloma in the left upper lobe. No confluent opacities. Upper Abdomen: Imaging into the upper abdomen shows no acute findings. Musculoskeletal: Chest wall soft tissues are unremarkable. No acute bony abnormality. Review of the MIP images confirms the above findings. IMPRESSION: Pulmonary emboli in all lobes of both lungs. Evidence of right heart strain (RV/LV Ratio = 1.06) consistent with at least submassive (intermediate risk) PE. The presence of right heart strain has been associated with an increased risk of morbidity and mortality. Please activate Code PE by paging [phone]. Coronary artery disease Aortic Atherosclerosis (ICD10-I70.0). Critical Value/emergent results were called by telephone at the time of interpretation on 01/13/2020 at 11:28 pm to providerMARCY Shamus Desantis , who verbally acknowledged these results. Electronically Signed   By: Rolm Baptise M.D.   On: 01/13/2020 23:30    Procedures Procedures (including critical care time) CRITICAL CARE Performed by: Charlesetta Shanks   Total critical care time: 30 minutes  Critical care time was exclusive of separately billable procedures and treating other patients.  Critical care was necessary to treat or prevent imminent or life-threatening deterioration.  Critical care was time spent personally by me on the following activities: development of treatment plan with patient and/or surrogate as well as nursing, discussions with consultants, evaluation of patient's response to treatment, examination of patient, obtaining history from patient or  surrogate, ordering and performing treatments and interventions, ordering and review of laboratory studies, ordering and review of radiographic studies, pulse oximetry and re-evaluation of patient's condition. Medications Ordered in ED Medications  carvedilol (COREG) tablet 25 mg (25 mg Oral Given 01/13/20 2337)  heparin bolus via infusion 3,000 Units (has no administration in time range)  heparin ADULT infusion 100 units/mL (25000 units/282mL sodium chloride 0.45%) (has no administration in time range)  iohexol (OMNIPAQUE) 350 MG/ML injection 80 mL (80 mLs Intravenous Contrast Given 01/13/20 2304)  sodium chloride (PF) 0.9 % injection (10 mLs  Given 01/13/20 2339)  amLODipine (NORVASC) tablet 10 mg (10 mg Oral Given 01/13/20 2338)  pantoprazole (PROTONIX) injection 40 mg (40 mg Intravenous Given 01/13/20 2338)    ED Course  I have reviewed the triage vital signs and the nursing notes.  Pertinent labs & imaging results that were available during my care of the patient were reviewed by me and considered in my medical decision making (see chart for details).  Clinical Course as of Jan 13 25  Thu Jan 13, 2020  2342 Consult: Reviewed with Dr. Shanon Brow for admission.  Requests consultation also placed to intensivist service for submassive PE.   [MP]  Fri Jan 14, 2020  0026 Consult: Reviewed with Dr. Lamont Snowball of critical care.  Does not need to transfer to [facility].  Needs echo in the a.m.   [MP]    Clinical Course User Index [MP] Charlesetta Shanks, MD   MDM Rules/Calculators/A&P                      Patient developed fairly severe shortness of breath starting in the early hours of the morning and progresses throughout the day.  He does not have associated chest pain.  Patient had recent upper endoscopy with esophageal dilation.  CT confirms bilateral pulmonary embolus with early right heart strain per radiology.  Patient's mental status is clear.  With minimal exertion patient becomes very dyspneic.   At rest oxygen saturations are stable in the high 90s and he is not visibly dyspneic at rest.  We will plan for admission on heparin.  We will also initiate IV Protonix.  Patient has had recent esophageal dilation. Final Clinical Impression(s) / ED Diagnoses Final diagnoses:  Acute pulmonary embolism, unspecified pulmonary embolism type, unspecified whether acute cor pulmonale present [facility])    Rx / DC Orders ED Discharge Orders    None       Charlesetta Shanks, MD 01/13/20 [id number]    Charlesetta Shanks, MD 01/13/20 Darci Needle    Charlesetta Shanks, MD 01/14/20 [phone]

## 2020-01-13 NOTE — Telephone Encounter (Signed)
Pts wife called back after follow up call this am saying that the patient was having "difficulty breathing the past few hours." I told her to take the patient to the ED. Will let Dr. Havery Moros know. SM

## 2020-01-13 NOTE — Telephone Encounter (Signed)
Pt's wife called back and informed that pt is having difficulty breathing.

## 2020-01-13 NOTE — Telephone Encounter (Signed)
Thanks Sarah,  I called the patient on his cell, he is in the ED now. He had an EGD with dilation with me on 1/12. He tolerated the procedure well at the time. He did well after the procedure and all day yesterday, felt normal. States the dilation has helped his swallowing. This AM he developed shortness of breath with exertion which has persisted. He has no chest pain. He is eating well without any odynophagia, his only complaint is shortness of breath. No fevers. CXR and EKG in the ED initially normal, he is waiting to be seen. Unclear what is causing this. Given it started about a day and a half after his exam, this would be unusual to be related to the dilation, as he has no pain and is eating fine. ED will need to further evaluate this and help clarify what's going on, may need CT chest. Will await his workup.

## 2020-01-13 NOTE — ED Notes (Signed)
Repeat I- Stat for clarification

## 2020-01-13 NOTE — Telephone Encounter (Signed)
1. Have you developed a fever since your procedure? No  2.   Have you had an respiratory symptoms (SOB or cough) since your procedure? No  3.   Have you tested positive for COVID 19 since your procedure No  4.   Have you had any family members/close contacts diagnosed with the COVID 19 since your procedure?  No   If yes to any of these questions please route to Joylene John, RN and Alphonsa Gin, RN.    Follow up Call-  Call back number 01/11/2020  Post procedure Call Back phone  # [phone]  Permission to leave phone message Yes  Some recent data might be hidden     Patient questions:  Do you have a fever, pain , or abdominal swelling? Yes.   Pain Score  0 *  Have you tolerated food without any problems? Yes.    Have you been able to return to your normal activities? Yes.    Do you have any questions about your discharge instructions: Diet   No. Medications  No. Follow up visit  No.  Do you have questions or concerns about your Care? No.  Actions: * If pain score is 4 or above: No action needed, pain <4.

## 2020-01-13 NOTE — Telephone Encounter (Signed)
Attempted to reach patient for post-procedure f/u call. No answer. Left message that we will make another attempt to reach him again later today and for him to please not hesitate to call us if he has any questions/concerns regarding his care. 

## 2020-01-13 NOTE — ED Notes (Signed)
Reported critical lab value: I STAT Chem 8: Ca 0.85 to RN Roderic Palau F Given new sample by RN to repeat I-STAT Chem 8 Gave new result: Ca 1.13 to RN Alphonse Guild

## 2020-01-13 NOTE — ED Triage Notes (Signed)
Pt had endoscopy 2 days ago. Reports  SOB started today.

## 2020-01-13 NOTE — Telephone Encounter (Signed)
Called and spoke to pt.  He has requested the records but said that it will likely take a while.  He has not forgotten and will follow up to get them sent to [name].

## 2020-01-13 NOTE — Progress Notes (Signed)
ANTICOAGULATION CONSULT NOTE - Initial Consult  Pharmacy Consult for Heparin Indication: pulmonary embolus  No Known Allergies  Patient Measurements:   Heparin Dosing Weight:   Vital Signs: Temp: 98.3 F (36.8 C) (01/14 2339) Temp Source: Oral (01/14 2339) BP: 157/98 (01/14 2339) Pulse Rate: 75 (01/14 2339)  Labs: Recent Labs    01/13/20 2059 01/13/20 2059 01/13/20 2132 01/13/20 2143 01/13/20 2200  HGB 16.2   < >  --  15.6 15.6  HCT 49.8  --   --  46.0 46.0  PLT 172  --   --   --   --   LABPROT 13.7  --   --   --   --   INR 1.1  --   --   --   --   CREATININE 1.48*  --   --  1.50* 1.50*  TROPONINIHS 19*  --  18*  --   --    < > = values in this interval not displayed.    Estimated Creatinine Clearance: 55.3 mL/min (A) (by C-G formula based on SCr of 1.5 mg/dL (H)).   Medical History: Past Medical History:  Diagnosis Date  . Chronic back pain greater than 3 months duration   . Chronic leg pain   . Deafness in right ear   . Extrinsic asthma, unspecified 04/07/2009   PT DENIES  . GERD (gastroesophageal reflux disease)   . History of kidney stones   . HYPERTENSION 10/29/2007  . HYPOTENSION 08/13/2010  . LOW BACK PAIN 04/21/2008  . NEPHROLITHIASIS, HX OF 04/21/2008  . PUD, HX OF 04/21/2008  . Renal disorder     Medications:  Infusions:  . [START ON 01/14/2020] heparin      Assessment: Patient with PE and + D-Dimer.  No oral anticoagulants noted on med rec.  Baseline PT WNL.    Goal of Therapy:  Heparin level 0.3-0.7 units/ml Monitor platelets by anticoagulation protocol: Yes   Plan:  Heparin bolus  3000 units iv x1 Heparin drip at 1600 units/hr Daily CBC Next heparin level at 0900    Tyler Deis, Shea Stakes Crowford 01/13/2020,11:57 PM

## 2020-01-13 NOTE — Telephone Encounter (Signed)
-----   Message from Roetta Sessions, [place] sent at 01/06/2020  6:13 PM EST ----- Regarding: colon records from [place]? Dr. Havery Moros indicated the patient said he would try to get [name] a copy of his last colonoscopy at the [facility] since it is much easier for him to get  a copy then [name].  Check with patient to see if he has requested they be sent to [name].

## 2020-01-14 ENCOUNTER — Inpatient Hospital Stay (HOSPITAL_COMMUNITY): Payer: Medicare Other

## 2020-01-14 DIAGNOSIS — E785 Hyperlipidemia, unspecified: Secondary | ICD-10-CM | POA: Diagnosis present

## 2020-01-14 DIAGNOSIS — Z85830 Personal history of malignant neoplasm of bone: Secondary | ICD-10-CM | POA: Diagnosis not present

## 2020-01-14 DIAGNOSIS — I2692 Saddle embolus of pulmonary artery without acute cor pulmonale: Secondary | ICD-10-CM | POA: Diagnosis not present

## 2020-01-14 DIAGNOSIS — G8929 Other chronic pain: Secondary | ICD-10-CM | POA: Diagnosis present

## 2020-01-14 DIAGNOSIS — M25472 Effusion, left ankle: Secondary | ICD-10-CM | POA: Diagnosis present

## 2020-01-14 DIAGNOSIS — Z87891 Personal history of nicotine dependence: Secondary | ICD-10-CM | POA: Diagnosis not present

## 2020-01-14 DIAGNOSIS — I2699 Other pulmonary embolism without acute cor pulmonale: Secondary | ICD-10-CM

## 2020-01-14 DIAGNOSIS — Z8711 Personal history of peptic ulcer disease: Secondary | ICD-10-CM | POA: Diagnosis not present

## 2020-01-14 DIAGNOSIS — Z20822 Contact with and (suspected) exposure to covid-19: Secondary | ICD-10-CM | POA: Diagnosis present

## 2020-01-14 DIAGNOSIS — Z87442 Personal history of urinary calculi: Secondary | ICD-10-CM | POA: Diagnosis not present

## 2020-01-14 DIAGNOSIS — M7989 Other specified soft tissue disorders: Secondary | ICD-10-CM | POA: Diagnosis present

## 2020-01-14 DIAGNOSIS — J45909 Unspecified asthma, uncomplicated: Secondary | ICD-10-CM | POA: Diagnosis present

## 2020-01-14 DIAGNOSIS — Z825 Family history of asthma and other chronic lower respiratory diseases: Secondary | ICD-10-CM | POA: Diagnosis not present

## 2020-01-14 DIAGNOSIS — M545 Low back pain: Secondary | ICD-10-CM | POA: Diagnosis present

## 2020-01-14 DIAGNOSIS — Z8701 Personal history of pneumonia (recurrent): Secondary | ICD-10-CM | POA: Diagnosis not present

## 2020-01-14 DIAGNOSIS — I82452 Acute embolism and thrombosis of left peroneal vein: Secondary | ICD-10-CM | POA: Diagnosis present

## 2020-01-14 DIAGNOSIS — M450 Ankylosing spondylitis of multiple sites in spine: Secondary | ICD-10-CM | POA: Diagnosis not present

## 2020-01-14 DIAGNOSIS — Z79899 Other long term (current) drug therapy: Secondary | ICD-10-CM | POA: Diagnosis not present

## 2020-01-14 DIAGNOSIS — Z7952 Long term (current) use of systemic steroids: Secondary | ICD-10-CM | POA: Diagnosis not present

## 2020-01-14 DIAGNOSIS — M79606 Pain in leg, unspecified: Secondary | ICD-10-CM | POA: Diagnosis present

## 2020-01-14 DIAGNOSIS — H9191 Unspecified hearing loss, right ear: Secondary | ICD-10-CM | POA: Diagnosis present

## 2020-01-14 DIAGNOSIS — K219 Gastro-esophageal reflux disease without esophagitis: Secondary | ICD-10-CM | POA: Diagnosis present

## 2020-01-14 DIAGNOSIS — Z8546 Personal history of malignant neoplasm of prostate: Secondary | ICD-10-CM | POA: Diagnosis not present

## 2020-01-14 DIAGNOSIS — I10 Essential (primary) hypertension: Secondary | ICD-10-CM | POA: Diagnosis present

## 2020-01-14 LAB — BASIC METABOLIC PANEL
Anion gap: 8 (ref 5–15)
BUN: 19 mg/dL (ref 8–23)
CO2: 26 mmol/L (ref 22–32)
Calcium: 8.2 mg/dL — ABNORMAL LOW (ref 8.9–10.3)
Chloride: 107 mmol/L (ref 98–111)
Creatinine, Ser: 1.4 mg/dL — ABNORMAL HIGH (ref 0.61–1.24)
GFR calc Af Amer: 57 mL/min — ABNORMAL LOW (ref >60)
GFR calc non Af Amer: 49 mL/min — ABNORMAL LOW (ref >60)
Glucose, Bld: 94 mg/dL (ref 70–99)
Potassium: 3.7 mmol/L (ref 3.5–5.1)
Sodium: 141 mmol/L (ref 135–145)

## 2020-01-14 LAB — CBC
HCT: 45.1 % (ref 39.0–52.0)
Hemoglobin: 14.4 g/dL (ref 13.0–17.0)
MCH: 28.7 pg (ref 26.0–34.0)
MCHC: 31.9 g/dL (ref 30.0–36.0)
MCV: 89.8 fL (ref 80.0–100.0)
Platelets: 153 10*3/uL (ref 150–400)
RBC: 5.02 MIL/uL (ref 4.22–5.81)
RDW: 14.5 % (ref 11.5–15.5)
WBC: 8.8 10*3/uL (ref 4.0–10.5)
nRBC: 0 % (ref 0.0–0.2)

## 2020-01-14 LAB — ECHOCARDIOGRAM COMPLETE

## 2020-01-14 LAB — HEPARIN LEVEL (UNFRACTIONATED)
Heparin Unfractionated: 0.89 IU/mL — ABNORMAL HIGH (ref 0.30–0.70)
Heparin Unfractionated: 0.76 IU/mL — ABNORMAL HIGH (ref 0.30–0.70)

## 2020-01-14 LAB — MRSA PCR SCREENING: MRSA by PCR: NEGATIVE

## 2020-01-14 MED ORDER — ALUM & MAG HYDROXIDE-SIMETH 200-200-20 MG/5ML PO SUSP: 30.0000 mL | Freq: Four times a day (QID) | ORAL | Status: DC | PRN

## 2020-01-14 MED ORDER — SODIUM CHLORIDE 0.9 % IV SOLN: 250.0000 mL | INTRAVENOUS | Status: DC | PRN

## 2020-01-14 MED ORDER — SODIUM CHLORIDE 0.9% FLUSH: 3.0000 mL | INTRAVENOUS | Status: DC | PRN

## 2020-01-14 MED ORDER — SODIUM CHLORIDE 0.9% FLUSH
3.0000 mL | Freq: Two times a day (BID) | INTRAVENOUS | Status: DC
  Administered 2020-01-14 – 2020-01-16 (×5): 3 mL via INTRAVENOUS

## 2020-01-14 MED ORDER — CHLORHEXIDINE GLUCONATE CLOTH 2 % EX PADS
6.0000 | MEDICATED_PAD | Freq: Every day | CUTANEOUS | Status: DC
  Administered 2020-01-14 – 2020-01-16 (×3): 6 via TOPICAL

## 2020-01-14 MED ORDER — HEPARIN (PORCINE) 25000 UT/250ML-% IV SOLN
1000.0000 [IU]/h | INTRAVENOUS | Status: AC
  Administered 2020-01-14: 1000 [IU]/h via INTRAVENOUS

## 2020-01-14 MED ORDER — HEPARIN (PORCINE) 25000 UT/250ML-% IV SOLN
1200.0000 [IU]/h | INTRAVENOUS | Status: DC
  Administered 2020-01-14: 11:00:00 1200 [IU]/h via INTRAVENOUS
  Filled 2020-01-14: qty 250

## 2020-01-14 MED ORDER — PANTOPRAZOLE SODIUM 40 MG PO TBEC
40.0000 mg | DELAYED_RELEASE_TABLET | Freq: Every day | ORAL | Status: DC
  Administered 2020-01-14 – 2020-01-16 (×3): 40 mg via ORAL
  Filled 2020-01-14 (×3): qty 1

## 2020-01-14 NOTE — Progress Notes (Signed)
*  PRELIMINARY RESULTS* Echocardiogram 2D Echocardiogram has been performed.  Bruce Ball 01/14/2020, 12:51 PM

## 2020-01-14 NOTE — ED Notes (Signed)
ECHO at bedside.

## 2020-01-14 NOTE — Progress Notes (Signed)
ANTICOAGULATION CONSULT NOTE  Pharmacy Consult for Heparin Indication: pulmonary embolus  No Known Allergies  Patient Measurements:   Heparin Dosing Weight:   Vital Signs: Temp: 98.3 F (36.8 C) (01/14 2339) Temp Source: Oral (01/14 2339) BP: 127/74 (01/15 1015) Pulse Rate: 74 (01/15 1015)  Labs: Recent Labs    01/13/20 2059 01/13/20 2059 01/13/20 2132 01/13/20 2143 01/13/20 2143 01/13/20 2200 01/14/20 0425 01/14/20 0956  HGB 16.2   < >  --  15.6   < > 15.6 14.4  --   HCT 49.8   < >  --  46.0  --  46.0 45.1  --   PLT 172  --   --   --   --   --  153  --   LABPROT 13.7  --   --   --   --   --   --   --   INR 1.1  --   --   --   --   --   --   --   HEPARINUNFRC  --   --   --   --   --   --   --  0.89*  CREATININE 1.48*   < >  --  1.50*  --  1.50* 1.40*  --   TROPONINIHS 19*  --  18*  --   --   --   --   --    < > = values in this interval not displayed.   Estimated Creatinine Clearance: 59.2 mL/min (A) (by C-G formula based on SCr of 1.4 mg/dL (H)).  Medical History: Past Medical History:  Diagnosis Date  . Chronic back pain greater than 3 months duration   . Chronic leg pain   . Deafness in right ear   . Extrinsic asthma, unspecified 04/07/2009   PT DENIES  . GERD (gastroesophageal reflux disease)   . History of kidney stones   . HYPERTENSION 10/29/2007  . HYPOTENSION 08/13/2010  . LOW BACK PAIN 04/21/2008  . NEPHROLITHIASIS, HX OF 04/21/2008  . PUD, HX OF 04/21/2008  . Renal disorder    Medications:  Infusions:  . sodium chloride    . heparin      Assessment: 22 yoM with PE and + D-Dimer 11.5.  No oral anticoagulants noted on med rec.  Baseline CBC wnl. Troponin 18, 2D echo completed    Today, 01/14/2020  1/15 Heparin bolus 3000 units charted at 0118, infusion at 1600 units/hr 1st Hep level at 1000 = 0.89, above desired range   Goal of Therapy:  Heparin level 0.3-0.7 units/ml Monitor platelets by anticoagulation protocol: Yes   Plan:  Reduce  Heparin infusion to 1200 units/hr 2nd Heparin level at 1800 Daily CBC, plan daily Hep level when at steady state   Minda Ditto PharmD 01/14/2020,10:52 AM

## 2020-01-14 NOTE — Progress Notes (Signed)
eLink Physician-Brief Progress Note Patient Name: Bruce Ball DOB: March 15, 1946 MRN: [id number]   Date of Service  01/14/2020  HPI/Events of Note  Pt with bilateral PE, Troponin 18, BNP 42, RV/LV ratio 1.06, Pt hemodynamically stable and without oxygenation issues  eICU Interventions  No indication for transfer from [facility], a.m. Echo looking for RV dysfunction in a.m., Heparinization for PE overnight.        Kerry Kass Khriz Liddy 01/14/2020, 12:26 AM

## 2020-01-14 NOTE — Progress Notes (Signed)
Bilateral lower extremity venous duplex completed. Refer to "CV Proc" under chart review to view preliminary results.  Critical results discussed with Dr. Rodena Piety.  01/14/2020 4:11 PM Kelby Aline., MHA, RVT, RDCS, RDMS

## 2020-01-14 NOTE — ED Notes (Signed)
Jayzon Blakenship, wife would like an update on her husband, [phone].

## 2020-01-14 NOTE — Plan of Care (Signed)
74 year old male admitted this morning with history of hypertension status post recent EGD with dilatation 2 days ago.  CT chest shows bilateral PE in all lobes of  lungs..  Continue heparin drip.  Echo done results pending.  Vascular ultrasound of the lower extremities ordered.  Discussed with his wife Bruce Ball at [id number].

## 2020-01-14 NOTE — Progress Notes (Signed)
Brief Pharmacy Consult Note - IV heparin for PE/DVT  Labs: heparin level 0.76  A/P: heparin level still SUPRAtherapeutic (goal 0.3-0.7) but improved on current IV heparin rate of 1200 units/hr. Per RN, no reported bleeding. Decrease IV heparin to 1000 units/hr. Recheck heparin level again 6 hours after rate decrease.  Adrian Saran, PharmD, BCPS 01/14/2020 6:24 PM

## 2020-01-14 NOTE — H&P (Signed)
History and Physical    Bruce Ball [id number] DOB: Jul 21, 1946 DOA: 01/13/2020  PCP: [name], MD  Patient coming from: home  Chief Complaint: sob  HPI: Bruce Ball is a 74 y.o. male with medical history significant of htn recent egd with dilation 2 days ago comes in with sudden sob that occurred earlier today. No fevers, no cough.  No cp n/v/d.  No problems swallowing.  Pt reports he has chronic swelling to his legs/ankles but has been worse today esp his left ankle.  No pain there.  No recent trauma, illness, or travel.  No prior h/o VTE.  Pt found to have bilateral PE and referred for admission for further work up.  Vitals have been stable in the ED.  Has been started on hep drip.  Review of Systems: As per HPI otherwise 10 point review of systems negative.   Past Medical History:  Diagnosis Date  . Chronic back pain greater than 3 months duration   . Chronic leg pain   . Deafness in right ear   . Extrinsic asthma, unspecified 04/07/2009   PT DENIES  . GERD (gastroesophageal reflux disease)   . History of kidney stones   . HYPERTENSION 10/29/2007  . HYPOTENSION 08/13/2010  . LOW BACK PAIN 04/21/2008  . NEPHROLITHIASIS, HX OF 04/21/2008  . PUD, HX OF 04/21/2008  . Renal disorder     Past Surgical History:  Procedure Laterality Date  . bone cancer  1970   rt femur removed and steel rod placed  . CYSTOSCOPY WITH RETROGRADE PYELOGRAM, URETEROSCOPY AND STENT PLACEMENT Right 11/06/2017   Procedure: CYSTOSCOPY WITH  STENT PLACEMENT RIGHT;  Surgeon: Raynelle Bring, MD;  Location: [facility];  Service: Urology;  Laterality: Right;  . CYSTOSCOPY WITH RETROGRADE PYELOGRAM, URETEROSCOPY AND STENT PLACEMENT Left 06/09/2019   Procedure: CYSTOSCOPY WITH RETROGRADE PYELOGRAM, URETEROSCOPY AND STENT PLACEMENT X2;  Surgeon: [name], MD;  Location: [facility];  Service: Urology;  Laterality: Left;  . CYSTOSCOPY/RETROGRADE/URETEROSCOPY     1 year ago at [place] in [place]  .  CYSTOSCOPY/URETEROSCOPY/HOLMIUM LASER Right 11/24/2017   Procedure: CYSTOSCOPY/URETEROSCOPY/ RETROGRADE/STENT REMOVAL;  Surgeon: Raynelle Bring, MD;  Location: [facility];  Service: Urology;  Laterality: Right;  . prosatectomy       reports that he quit smoking about 41 years ago. He has never used smokeless tobacco. He reports that he does not drink alcohol or use drugs.  No Known Allergies  Family History  Problem Relation Age of Onset  . COPD Father   . Cancer Other        prostate  . Prostate cancer Brother   . Esophageal cancer Neg Hx   . Stomach cancer Neg Hx   . Pancreatic cancer Neg Hx   . Colon cancer Neg Hx     Prior to Admission medications   Medication Sig Start Date End Date Taking? Authorizing Provider  amLODipine (NORVASC) 10 MG tablet Take 5 mg by mouth at bedtime.    Yes [provider]  atorvastatin (LIPITOR) 20 MG tablet Take 20 mg at bedtime by mouth.    Yes [provider]  carvedilol (COREG) 25 MG tablet Take 25 mg 2 (two) times daily by mouth.    Yes [provider]  Cholecalciferol (VITAMIN D3) 50 MCG (2000 UT) capsule Take 2,000 Units by mouth daily.   Yes [provider]  isosorbide mononitrate (IMDUR) 60 MG 24 hr tablet Take 60 mg by mouth at bedtime.  Yes [provider]  omeprazole (PRILOSEC) 40 MG capsule Take 40 mg by mouth 2 (two) times daily.   Yes [provider]  predniSONE (DELTASONE) 5 MG tablet Take 5 mg by mouth daily with breakfast.   Yes [provider]  valACYclovir (VALTREX) 1000 MG tablet Take 1 tablet (1,000 mg total) by mouth 2 (two) times daily. Patient not taking: Reported on 01/13/2020 11/29/19   Gregor Hams, MD    Physical Exam: Vitals:   01/13/20 2339 01/14/20 0000 01/14/20 0015 01/14/20 0030  BP: (!) 157/98 (!) 173/91 (!) 165/93 (!) 148/97  Pulse: 75 76 76 72  Resp: 20 20 19 16   Temp: 98.3 F (36.8 C)     TempSrc: Oral     SpO2: 98% 98% 97% 96%       Constitutional: NAD, calm, comfortable Vitals:   01/13/20 2339 01/14/20 0000 01/14/20 0015 01/14/20 0030  BP: (!) 157/98 (!) 173/91 (!) 165/93 (!) 148/97  Pulse: 75 76 76 72  Resp: 20 20 19 16   Temp: 98.3 F (36.8 C)     TempSrc: Oral     SpO2: 98% 98% 97% 96%   Eyes: PERRL, lids and conjunctivae normal ENMT: Mucous membranes are moist. Posterior pharynx clear of any exudate or lesions.Normal dentition.  Neck: normal, supple, no masses, no thyromegaly Respiratory: clear to auscultation bilaterally, no wheezing, no crackles. Normal respiratory effort. No accessory muscle use.  Cardiovascular: Regular rate and rhythm, no murmurs / rubs / gallops. No extremity edema. 2+ pedal pulses. No carotid bruits.  Abdomen: no tenderness, no masses palpated. No hepatosplenomegaly. Bowel sounds positive.  Musculoskeletal: no clubbing / cyanosis. No joint deformity upper and lower extremities. Good ROM, no contractures. Normal muscle tone.  Skin: no rashes, lesions, ulcers. No induration Neurologic: CN 2-12 grossly intact. Sensation intact, DTR normal. Strength 5/5 in all 4.  Psychiatric: Normal judgment and insight. Alert and oriented x 3. Normal mood.    Labs on Admission: I have personally reviewed following labs and imaging studies  CBC: Recent Labs  Lab 01/13/20 2059 01/13/20 2143 01/13/20 2200  WBC 9.9  --   --   NEUTROABS 7.5  --   --   HGB 16.2 15.6 15.6  HCT 49.8 46.0 46.0  MCV 89.9  --   --   PLT 172  --   --    Basic Metabolic Panel: Recent Labs  Lab 01/13/20 2059 01/13/20 2143 01/13/20 2200  NA 140 141 140  K 4.1 4.0 4.1  CL 107 103 104  CO2 26  --   --   GLUCOSE 102* 92 95  BUN 21 21 24*  CREATININE 1.48* 1.50* 1.50*  CALCIUM 8.6*  --   --    GFR: Estimated Creatinine Clearance: 55.3 mL/min (A) (by C-G formula based on SCr of 1.5 mg/dL (H)). Liver Function Tests: Recent Labs  Lab 01/13/20 2059  AST 19  ALT 25  ALKPHOS 67  BILITOT 1.2  PROT 6.5   ALBUMIN 3.6   Recent Labs  Lab 01/13/20 2059  LIPASE 37   No results for input(s): AMMONIA in the last 168 hours. Coagulation Profile: Recent Labs  Lab 01/13/20 2059  INR 1.1   Cardiac Enzymes: No results for input(s): CKTOTAL, CKMB, CKMBINDEX, TROPONINI in the last 168 hours. BNP (last 3 results) No results for input(s): PROBNP in the last 8760 hours. HbA1C: No results for input(s): HGBA1C in the last 72 hours. CBG: No results for input(s): GLUCAP in the  last 168 hours. Lipid Profile: No results for input(s): CHOL, HDL, LDLCALC, TRIG, CHOLHDL, LDLDIRECT in the last 72 hours. Thyroid Function Tests: No results for input(s): TSH, T4TOTAL, FREET4, T3FREE, THYROIDAB in the last 72 hours. Anemia Panel: No results for input(s): VITAMINB12, FOLATE, FERRITIN, TIBC, IRON, RETICCTPCT in the last 72 hours. Urine analysis:    Component Value Date/Time   COLORURINE YELLOW 01/13/2020 2059   APPEARANCEUR CLEAR 01/13/2020 2059   LABSPEC 1.020 01/13/2020 2059   PHURINE 5.0 01/13/2020 2059   GLUCOSEU NEGATIVE 01/13/2020 2059   HGBUR SMALL (A) 01/13/2020 2059   HGBUR large 02/26/2010 1214   BILIRUBINUR NEGATIVE 01/13/2020 2059   BILIRUBINUR 1+ 06/26/2016 1117   KETONESUR NEGATIVE 01/13/2020 2059   PROTEINUR NEGATIVE 01/13/2020 2059   UROBILINOGEN 1.0 06/26/2016 1117   UROBILINOGEN 1.0 03/05/2010 2252   NITRITE NEGATIVE 01/13/2020 2059   LEUKOCYTESUR NEGATIVE 01/13/2020 2059   Sepsis Labs: !!!!!!!!!!!!!!!!!!!!!!!!!!!!!!!!!!!!!!!!!!!! @LABRCNTIP (procalcitonin:4,lacticidven:4) ) Recent Results (from the past 240 hour(s))  SARS Coronavirus 2 ([facility] End[facility]/Gastro ONLY)     Status: None   Collection Time: 01/07/20 12:00 AM   Specimen: Nasopharyngeal Swab  Result Value Ref Range Status   SARS Coronavirus 2 RESULT:  NEGATIVE  Final    Comment: RESULT:  NEGATIVESARS-CoV-2 INTERPRETATION:A NEGATIVE  test result means that SARS-CoV-2 RNA was not present in the specimen above the limit of  detection of this test. This does not preclude a possible SARS-CoV-2 infection and should not be used as the  sole basis for patient management decisions. Negative results must be combined with clinical observations, patient history, and epidemiological information. Optimum specimen types and timing for peak viral levels during infections caused by SARS-CoV-2  have not been determined. Collection of multiple specimens or types of specimens may be necessary to detect virus. Improper specimen collection and handling, sequence variability under primers/probes, or organism present below the limit of detection may  lead to false negative results. Positive and negative predictive values of testing are highly dependent on prevalence. False negative test results are more likely when prevalence of disease is high.The expected result is NEGATIVE.Fact  Sheet for  Healthcare Providers: https://www.woods-mathews.com/.Fact Sheet for Patients: SugarRoll.be.Normal Reference Range - Negative   Respiratory Panel by RT PCR (Flu A&B, Covid) - Nasopharyngeal Swab     Status: None   Collection Time: 01/13/20  8:59 PM   Specimen: Nasopharyngeal Swab  Result Value Ref Range Status   SARS Coronavirus 2 by RT PCR NEGATIVE NEGATIVE Final    Comment: (NOTE) SARS-CoV-2 target nucleic acids are NOT DETECTED. The SARS-CoV-2 RNA is generally detectable in upper respiratoy specimens during the acute phase of infection. The lowest concentration of SARS-CoV-2 viral copies this assay can detect is 131 copies/mL. A negative result does not preclude SARS-Cov-2 infection and should not be used as the sole basis for treatment or other patient management decisions. A negative result may occur with  improper specimen collection/handling, submission of specimen other than nasopharyngeal swab, presence of viral mutation(s) within the areas targeted by this assay, and inadequate number of viral  copies (<131 copies/mL). A negative result must be combined with clinical observations, patient history, and epidemiological information. The expected result is Negative. Fact Sheet for Patients:  PinkCheek.be Fact Sheet for Healthcare Providers:  GravelBags.it This test is not yet ap proved or cleared by the [place] FDA and  has been authorized for detection and/or diagnosis of SARS-CoV-2 by FDA under an Emergency Use Authorization (EUA). This EUA will remain  in effect (meaning  this test can be used) for the duration of the COVID-19 declaration under Section 564(b)(1) of the Act, 21 U.S.C. section 360bbb-3(b)(1), unless the authorization is terminated or revoked sooner.    Influenza A by PCR NEGATIVE NEGATIVE Final   Influenza B by PCR NEGATIVE NEGATIVE Final    Comment: (NOTE) The Xpert Xpress SARS-CoV-2/FLU/RSV assay is intended as an aid in  the diagnosis of influenza from Nasopharyngeal swab specimens and  should not be used as a sole basis for treatment. Nasal washings and  aspirates are unacceptable for Xpert Xpress SARS-CoV-2/FLU/RSV  testing. Fact Sheet for Patients: PinkCheek.be Fact Sheet for Healthcare Providers: GravelBags.it This test is not yet approved or cleared by the [place] FDA and  has been authorized for detection and/or diagnosis of SARS-CoV-2 by  FDA under an Emergency Use Authorization (EUA). This EUA will remain  in effect (meaning this test can be used) for the duration of the  Covid-19 declaration under Section 564(b)(1) of the Act, 21  U.S.C. section 360bbb-3(b)(1), unless the authorization is  terminated or revoked. Performed at [facility], [address] [place], [place] [address]      Radiological Exams on Admission: DG Chest 2 View  Result Date: 01/13/2020 CLINICAL DATA:  Shortness of breath  EXAM: CHEST - 2 VIEW COMPARISON:  Radiograph 12/15/2018 FINDINGS: Stable calcified granuloma in the left mid lung. Chronically coarsened interstitial changes most pronounced in the left base are similar to prior. No consolidation, features of edema, pneumothorax, or effusion. The cardiomediastinal contours are unremarkable. No acute osseous or soft tissue abnormality. Degenerative changes are present in the imaged spine and shoulders. IMPRESSION: No acute cardiopulmonary abnormality. Electronically Signed   By: Lovena Le M.D.   On: 01/13/2020 16:55   CT Angio Chest PE W/Cm &/Or Wo Cm  Result Date: 01/13/2020 CLINICAL DATA:  Elevated D-dimer. EXAM: CT ANGIOGRAPHY CHEST WITH CONTRAST TECHNIQUE: Multidetector CT imaging of the chest was performed using the standard protocol during bolus administration of intravenous contrast. Multiplanar CT image reconstructions and MIPs were obtained to evaluate the vascular anatomy. CONTRAST:  28mL OMNIPAQUE IOHEXOL 350 MG/ML SOLN COMPARISON:  06/01/2014 FINDINGS: Cardiovascular: There are bilateral pulmonary emboli in all lobes of both lungs. RV/LV ratio slightly elevated at 1.06. Heart is normal size. Aorta is normal caliber. Scattered coronary artery and aortic calcifications. Mediastinum/Nodes: No mediastinal, hilar, or axillary adenopathy. Calcified mediastinal and left hilar lymph nodes present. Lungs/Pleura: Calcified granuloma in the left upper lobe. No confluent opacities. Upper Abdomen: Imaging into the upper abdomen shows no acute findings. Musculoskeletal: Chest wall soft tissues are unremarkable. No acute bony abnormality. Review of the MIP images confirms the above findings. IMPRESSION: Pulmonary emboli in all lobes of both lungs. Evidence of right heart strain (RV/LV Ratio = 1.06) consistent with at least submassive (intermediate risk) PE. The presence of right heart strain has been associated with an increased risk of morbidity and mortality. Please activate  Code PE by paging [phone]. Coronary artery disease Aortic Atherosclerosis (ICD10-I70.0). Critical Value/emergent results were called by telephone at the time of interpretation on 01/13/2020 at 11:28 pm to providerMARCY PFEIFFER , who verbally acknowledged these results. Electronically Signed   By: Rolm Baptise M.D.   On: 01/13/2020 23:30    EKG: Independently reviewed. nsr Old chart reviewed Case discussed with edp cxr reviewed no edema or infiltrate   Assessment/Plan 74 yo male with acute sob found to have bilateral submassive PE Principal Problem:   Pulmonary emboli ([place])- poss right  heart strain.  Ck echo in the am.  Heparin drip.  Hospitalize for 24 to 48 hours then d/c on oral AC for 6 months to a year.   Ensure all cancer screening up to date.  Active Problems:   Essential hypertension- noted, clarify home meds   Extrinsic asthma- stable lungs clear at this time   History of peptic ulcer disease- stable   Ankylosing spondylitis ([place])- stable     DVT prophylaxis: hep Code Status: full Family Communication: none Disposition Plan:  1-3 days Consults called:  PCCM Admission status:  admission   Pasqualino Witherspoon A MD [facility]  If 7PM-7AM, please contact night-coverage [email or web address] Password [facility]  01/14/2020, 12:57 AM

## 2020-01-15 DIAGNOSIS — M450 Ankylosing spondylitis of multiple sites in spine: Secondary | ICD-10-CM

## 2020-01-15 LAB — CBC
HCT: 45 % (ref 39.0–52.0)
Hemoglobin: 14.4 g/dL (ref 13.0–17.0)
MCH: 29.2 pg (ref 26.0–34.0)
MCHC: 32 g/dL (ref 30.0–36.0)
MCV: 91.3 fL (ref 80.0–100.0)
Platelets: 161 10*3/uL (ref 150–400)
RBC: 4.93 MIL/uL (ref 4.22–5.81)
RDW: 14.3 % (ref 11.5–15.5)
WBC: 7.6 10*3/uL (ref 4.0–10.5)
nRBC: 0 % (ref 0.0–0.2)

## 2020-01-15 LAB — HEPARIN LEVEL (UNFRACTIONATED): Heparin Unfractionated: 0.45 IU/mL (ref 0.30–0.70)

## 2020-01-15 MED ORDER — ACETAMINOPHEN 325 MG PO TABS: 650.0000 mg | ORAL_TABLET | Freq: Four times a day (QID) | ORAL | Status: DC | PRN

## 2020-01-15 MED ORDER — APIXABAN 5 MG PO TABS: 5.0000 mg | ORAL_TABLET | Freq: Two times a day (BID) | ORAL | Status: DC

## 2020-01-15 MED ORDER — ATORVASTATIN CALCIUM 10 MG PO TABS
20.0000 mg | ORAL_TABLET | Freq: Every day | ORAL | Status: DC
  Administered 2020-01-15: 20 mg via ORAL
  Filled 2020-01-15: qty 2

## 2020-01-15 MED ORDER — HYDROCODONE-ACETAMINOPHEN 5-325 MG PO TABS: 1.0000 | ORAL_TABLET | Freq: Four times a day (QID) | ORAL | Status: DC | PRN

## 2020-01-15 MED ORDER — ISOSORBIDE MONONITRATE ER 60 MG PO TB24
60.0000 mg | ORAL_TABLET | Freq: Every day | ORAL | Status: DC
  Administered 2020-01-15: 22:00:00 60 mg via ORAL
  Filled 2020-01-15: qty 1

## 2020-01-15 MED ORDER — MORPHINE SULFATE (PF) 2 MG/ML IV SOLN
2.0000 mg | Freq: Once | INTRAVENOUS | Status: AC
  Administered 2020-01-15: 20:00:00 2 mg via INTRAVENOUS
  Filled 2020-01-15: qty 1

## 2020-01-15 MED ORDER — APIXABAN 5 MG PO TABS
10.0000 mg | ORAL_TABLET | Freq: Two times a day (BID) | ORAL | Status: DC
  Administered 2020-01-15 – 2020-01-16 (×3): 10 mg via ORAL
  Filled 2020-01-15 (×3): qty 2

## 2020-01-15 MED ORDER — VITAMIN D 25 MCG (1000 UNIT) PO TABS
2000.0000 [IU] | ORAL_TABLET | Freq: Every day | ORAL | Status: DC
  Administered 2020-01-15 – 2020-01-16 (×2): 2000 [IU] via ORAL
  Filled 2020-01-15 (×2): qty 2

## 2020-01-15 MED ORDER — PREDNISONE 5 MG PO TABS
5.0000 mg | ORAL_TABLET | Freq: Every day | ORAL | Status: DC
  Administered 2020-01-16: 5 mg via ORAL
  Filled 2020-01-15: qty 1

## 2020-01-15 NOTE — Progress Notes (Signed)
PROGRESS NOTE    Bruce Ball  [id number] DOB: April 13, 1946 DOA: 01/13/2020 PCP: [name], MD   Brief Narrative: 74 y.o. male with medical history significant of htn recent egd with dilation 2 days ago comes in with sudden sob that occurred earlier today. No fevers, no cough.  No cp n/v/d.  No problems swallowing.  Pt reports he has chronic swelling to his legs/ankles but has been worse today esp his left ankle.  No pain there.  No recent trauma, illness, or travel.  No prior h/o VTE.  Pt found to have bilateral PE and referred for admission for further work up.  Vitals have been stable in the ED.  Has been started on hep drip.  Assessment & Plan:   Principal Problem:   Pulmonary emboli (HCC) Active Problems:   Essential hypertension   Extrinsic asthma   History of peptic ulcer disease   Ankylosing spondylitis (HCC)  #1 bilateral pulmonary embolism in all lobes of the lungs with acute left lower extremity DVT-patient feels much improved with decreased shortness of breath. Ambulated to the bathroom without difficulty. Remains on IV heparin. We will continue IV heparin drip today and start him on a DOAC tomorrow. His wife reported that he is sedentary.  CT chest- Pulmonary emboli in all lobes of both lungs. Evidence of right heart strain (RV/LV Ratio = 1.06) consistent with at least submassive (intermediate risk) PE. The presence of right heart strain has been associated with an increased risk of morbidity and mortality  Echo is- Left ventricular ejection fraction, by visual estimation, is 60 to 65%. The left ventricle has normal function. There is moderately increased left ventricular hypertrophy.   Moderate asymmetric hypertrophy of the basal septum measuring 43mm (posterior wall 61mm)  The mitral valve is normal in structure. No evidence of mitral valve regurgitation.  The aortic valve is tricuspid. Aortic valve regurgitation is trivial. Mild aortic valve sclerosis  without stenosis.   The tricuspid valve is grossly normal.  The pulmonic valve was not well visualized. Pulmonic valve regurgitation is trivial.  Global right ventricle has normal systolic function.The right ventricular size is normal.  Left atrial size was normal.  Right atrial size was normal.  TR signal is inadequate for assessing pulmonary artery systolic pressure.  Lower extremity Doppler-Right: There is no evidence of deep vein thrombosis in the lower extremity. No cystic structure found in the popliteal fossa. Left: Findings consistent with acute deep vein thrombosis involving the left peroneal veins. No cystic structure found in the popliteal fossa  PT evaluation. Pharmacy consult for DOAC.  #2 essential hypertension blood pressure elevated 159/88 restart home meds Coreg 25 mg twice a day, Imdur 60 mg at bedtime.  #3 extrinsic asthma continue home inhaler  #4 history of peptic ulcer disease status post recent EGD and dilatation restart PPI  #5 hyperlipidemia continue Lipitor     Estimated body mass index is 32.99 kg/m as calculated from the following:   Height as of this encounter: 5\' 11"  (1.803 m).   Weight as of this encounter: 107.3 kg.  DVT prophylaxis: Heparin Code Status: Full code  family Communication: Discussed with wife Disposition Plan: Likely home tomorrow if he remains stable Consultants:   None  Procedures: None Antimicrobials: None  Subjective: Sitting up in chair in no acute distress Feels breathing is easier than yesterday. Ambulated to the restroom by himself  Objective: Vitals:   01/15/20 0400 01/15/20 0600 01/15/20 0802 01/15/20 0844  BP: (!) 158/92 Marland Kitchen)  164/98 (!) 159/88   Pulse: 72 79 76   Resp: 15 18 18    Temp: 97.8 F (36.6 C)   98.2 F (36.8 C)  TempSrc: Oral   Oral  SpO2: 96% 94% 96%   Weight:      Height:        Intake/Output Summary (Last 24 hours) at 01/15/2020 0951 Last data filed at 01/15/2020 0744 Gross per 24 hour   Intake 249.34 ml  Output 600 ml  Net -350.66 ml   Filed Weights   01/14/20 1656  Weight: 107.3 kg    Examination:  General exam: Appears calm and comfortable  Respiratory system: Clear to auscultation. Respiratory effort normal. Cardiovascular system: S1 & S2 heard, RRR. No JVD, murmurs, rubs, gallops or clicks. No pedal edema. Gastrointestinal system: Abdomen is nondistended, soft and nontender. No organomegaly or masses felt. Normal bowel sounds heard. Central nervous system: Alert and oriented. No focal neurological deficits. Extremities: 1+ left lower extremity edema  skin: No rashes, lesions or ulcers Psychiatry: Judgement and insight appear normal. Mood & affect appropriate.     Data Reviewed: I have personally reviewed following labs and imaging studies  CBC: Recent Labs  Lab 01/13/20 2059 01/13/20 2143 01/13/20 2200 01/14/20 0425 01/15/20 0055  WBC 9.9  --   --  8.8 7.6  NEUTROABS 7.5  --   --   --   --   HGB 16.2 15.6 15.6 14.4 14.4  HCT 49.8 46.0 46.0 45.1 45.0  MCV 89.9  --   --  89.8 91.3  PLT 172  --   --  153 [id number]   Basic Metabolic Panel: Recent Labs  Lab 01/13/20 2059 01/13/20 2143 01/13/20 2200 01/14/20 0425  NA 140 141 140 141  K 4.1 4.0 4.1 3.7  CL 107 103 104 107  CO2 26  --   --  26  GLUCOSE 102* 92 95 94  BUN 21 21 24* 19  CREATININE 1.48* 1.50* 1.50* 1.40*  CALCIUM 8.6*  --   --  8.2*   GFR: Estimated Creatinine Clearance: 58.6 mL/min (A) (by C-G formula based on SCr of 1.4 mg/dL (H)). Liver Function Tests: Recent Labs  Lab 01/13/20 2059  AST 19  ALT 25  ALKPHOS 67  BILITOT 1.2  PROT 6.5  ALBUMIN 3.6   Recent Labs  Lab 01/13/20 2059  LIPASE 37   No results for input(s): AMMONIA in the last 168 hours. Coagulation Profile: Recent Labs  Lab 01/13/20 2059  INR 1.1   Cardiac Enzymes: No results for input(s): CKTOTAL, CKMB, CKMBINDEX, TROPONINI in the last 168 hours. BNP (last 3 results) No results for input(s):  PROBNP in the last 8760 hours. HbA1C: No results for input(s): HGBA1C in the last 72 hours. CBG: No results for input(s): GLUCAP in the last 168 hours. Lipid Profile: No results for input(s): CHOL, HDL, LDLCALC, TRIG, CHOLHDL, LDLDIRECT in the last 72 hours. Thyroid Function Tests: No results for input(s): TSH, T4TOTAL, FREET4, T3FREE, THYROIDAB in the last 72 hours. Anemia Panel: No results for input(s): VITAMINB12, FOLATE, FERRITIN, TIBC, IRON, RETICCTPCT in the last 72 hours. Sepsis Labs: Recent Labs  Lab 01/13/20 2059 01/13/20 2155  LATICACIDVEN 0.9 1.3    Recent Results (from the past 240 hour(s))  SARS Coronavirus 2 ([facility] ONLY)     Status: None   Collection Time: 01/07/20 12:00 AM   Specimen: Nasopharyngeal Swab  Result Value Ref Range Status   SARS Coronavirus 2 RESULT:  NEGATIVE  Final    Comment: RESULT:  NEGATIVESARS-CoV-2 INTERPRETATION:A NEGATIVE  test result means that SARS-CoV-2 RNA was not present in the specimen above the limit of detection of this test. This does not preclude a possible SARS-CoV-2 infection and should not be used as the  sole basis for patient management decisions. Negative results must be combined with clinical observations, patient history, and epidemiological information. Optimum specimen types and timing for peak viral levels during infections caused by SARS-CoV-2  have not been determined. Collection of multiple specimens or types of specimens may be necessary to detect virus. Improper specimen collection and handling, sequence variability under primers/probes, or organism present below the limit of detection may  lead to false negative results. Positive and negative predictive values of testing are highly dependent on prevalence. False negative test results are more likely when prevalence of disease is high.The expected result is NEGATIVE.Fact  Sheet for  Healthcare Providers: https://www.[employer].com/.Fact Sheet for  Patients: SugarRoll.be.Normal Reference Range - Negative   Respiratory Panel by RT PCR (Flu A&B, Covid) - Nasopharyngeal Swab     Status: None   Collection Time: 01/13/20  8:59 PM   Specimen: Nasopharyngeal Swab  Result Value Ref Range Status   SARS Coronavirus 2 by RT PCR NEGATIVE NEGATIVE Final    Comment: (NOTE) SARS-CoV-2 target nucleic acids are NOT DETECTED. The SARS-CoV-2 RNA is generally detectable in upper respiratoy specimens during the acute phase of infection. The lowest concentration of SARS-CoV-2 viral copies this assay can detect is 131 copies/mL. A negative result does not preclude SARS-Cov-2 infection and should not be used as the sole basis for treatment or other patient management decisions. A negative result may occur with  improper specimen collection/handling, submission of specimen other than nasopharyngeal swab, presence of viral mutation(s) within the areas targeted by this assay, and inadequate number of viral copies (<131 copies/mL). A negative result must be combined with clinical observations, patient history, and epidemiological information. The expected result is Negative. Fact Sheet for Patients:  PinkCheek.be Fact Sheet for Healthcare Providers:  GravelBags.it This test is not yet ap proved or cleared by the [place] FDA and  has been authorized for detection and/or diagnosis of SARS-CoV-2 by FDA under an Emergency Use Authorization (EUA). This EUA will remain  in effect (meaning this test can be used) for the duration of the COVID-19 declaration under Section 564(b)(1) of the Act, 21 U.S.C. section 360bbb-3(b)(1), unless the authorization is terminated or revoked sooner.    Influenza A by PCR NEGATIVE NEGATIVE Final   Influenza B by PCR NEGATIVE NEGATIVE Final    Comment: (NOTE) The Xpert Xpress SARS-CoV-2/FLU/RSV assay is intended as an aid in  the  diagnosis of influenza from Nasopharyngeal swab specimens and  should not be used as a sole basis for treatment. Nasal washings and  aspirates are unacceptable for Xpert Xpress SARS-CoV-2/FLU/RSV  testing. Fact Sheet for Patients: PinkCheek.be Fact Sheet for Healthcare Providers: GravelBags.it This test is not yet approved or cleared by the [place] FDA and  has been authorized for detection and/or diagnosis of SARS-CoV-2 by  FDA under an Emergency Use Authorization (EUA). This EUA will remain  in effect (meaning this test can be used) for the duration of the  Covid-19 declaration under Section 564(b)(1) of the Act, 21  U.S.C. section 360bbb-3(b)(1), unless the authorization is  terminated or revoked. Performed at [facility], [place] [address], [place], [place] [address]   MRSA PCR Screening     Status: None  Collection Time: 01/14/20  4:58 PM   Specimen: Nasal Mucosa; Nasopharyngeal  Result Value Ref Range Status   MRSA by PCR NEGATIVE NEGATIVE Final    Comment:        The GeneXpert MRSA Assay (FDA approved for NASAL specimens only), is one component of a comprehensive MRSA colonization surveillance program. It is not intended to diagnose MRSA infection nor to guide or monitor treatment for MRSA infections. Performed at [facility], [place] [address] [place], [place] [address]          Radiology Studies: DG Chest 2 View  Result Date: 01/13/2020 CLINICAL DATA:  Shortness of breath EXAM: CHEST - 2 VIEW COMPARISON:  Radiograph 12/15/2018 FINDINGS: Stable calcified granuloma in the left mid lung. Chronically coarsened interstitial changes most pronounced in the left base are similar to prior. No consolidation, features of edema, pneumothorax, or effusion. The cardiomediastinal contours are unremarkable. No acute osseous or soft tissue abnormality. Degenerative changes are  present in the imaged spine and shoulders. IMPRESSION: No acute cardiopulmonary abnormality. Electronically Signed   By: Lovena Le M.D.   On: 01/13/2020 16:55   CT Angio Chest PE W/Cm &/Or Wo Cm  Result Date: 01/13/2020 CLINICAL DATA:  Elevated D-dimer. EXAM: CT ANGIOGRAPHY CHEST WITH CONTRAST TECHNIQUE: Multidetector CT imaging of the chest was performed using the standard protocol during bolus administration of intravenous contrast. Multiplanar CT image reconstructions and MIPs were obtained to evaluate the vascular anatomy. CONTRAST:  1mL OMNIPAQUE IOHEXOL 350 MG/ML SOLN COMPARISON:  06/01/2014 FINDINGS: Cardiovascular: There are bilateral pulmonary emboli in all lobes of both lungs. RV/LV ratio slightly elevated at 1.06. Heart is normal size. Aorta is normal caliber. Scattered coronary artery and aortic calcifications. Mediastinum/Nodes: No mediastinal, hilar, or axillary adenopathy. Calcified mediastinal and left hilar lymph nodes present. Lungs/Pleura: Calcified granuloma in the left upper lobe. No confluent opacities. Upper Abdomen: Imaging into the upper abdomen shows no acute findings. Musculoskeletal: Chest wall soft tissues are unremarkable. No acute bony abnormality. Review of the MIP images confirms the above findings. IMPRESSION: Pulmonary emboli in all lobes of both lungs. Evidence of right heart strain (RV/LV Ratio = 1.06) consistent with at least submassive (intermediate risk) PE. The presence of right heart strain has been associated with an increased risk of morbidity and mortality. Please activate Code PE by paging [phone]. Coronary artery disease Aortic Atherosclerosis (ICD10-I70.0). Critical Value/emergent results were called by telephone at the time of interpretation on 01/13/2020 at 11:28 pm to providerMARCY PFEIFFER , who verbally acknowledged these results. Electronically Signed   By: Rolm Baptise M.D.   On: 01/13/2020 23:30   ECHOCARDIOGRAM COMPLETE  Result Date:  01/14/2020   ECHOCARDIOGRAM REPORT   Patient Name:   ELCHANAN DALESANDRO Date of Exam: 01/14/2020 Medical Rec #:  [id number]       Height:       71.0 in Accession #:    [id number]      Weight:       242.0 lb Date of Birth:  1946-10-29       BSA:          2.29 m Patient Age:    7 years        BP:           127/74 mmHg Patient Gender: M               HR:           74 bpm. Exam Location:  Inpatient Procedure: 2D  Echo, Cardiac Doppler and Color Doppler Indications:    Pulmonary Embolus 415.19  History:        Patient has no prior history of Echocardiogram examinations.                 Risk Factors:Hypertension and Dyslipidemia. History of prostate                 cancer,GERD.  Sonographer:    Alvino Chapel RCS Referring Phys: [place]  1. Left ventricular ejection fraction, by visual estimation, is 60 to 65%. The left ventricle has normal function. There is moderately increased left ventricular hypertrophy.  2. Moderate asymmetric hypertrophy of the basal septum measuring 46mm (posterior wall 11mm)  3. The mitral valve is normal in structure. No evidence of mitral valve regurgitation.  4. The aortic valve is tricuspid. Aortic valve regurgitation is trivial. Mild aortic valve sclerosis without stenosis.  5. The tricuspid valve is grossly normal.  6. The pulmonic valve was not well visualized. Pulmonic valve regurgitation is trivial.  7. Global right ventricle has normal systolic function.The right ventricular size is normal.  8. Left atrial size was normal.  9. Right atrial size was normal. 10. TR signal is inadequate for assessing pulmonary artery systolic pressure. FINDINGS  Left Ventricle: Left ventricular ejection fraction, by visual estimation, is 60 to 65%. The left ventricle has normal function. The left ventricle has no regional wall motion abnormalities. There is moderately increased left ventricular hypertrophy. Asymmetric left ventricular hypertrophy. Left ventricular diastolic parameters  were normal. Right Ventricle: The right ventricular size is normal. No increase in right ventricular wall thickness. Global RV systolic function is has normal systolic function. Left Atrium: Left atrial size was normal in size. Right Atrium: Right atrial size was normal in size Pericardium: There is no evidence of pericardial effusion. Mitral Valve: The mitral valve is normal in structure. No evidence of mitral valve regurgitation. Tricuspid Valve: The tricuspid valve is grossly normal. Tricuspid valve regurgitation is not demonstrated. Aortic Valve: The aortic valve is tricuspid. Aortic valve regurgitation is trivial. Mild aortic valve sclerosis is present, with no evidence of aortic valve stenosis. Pulmonic Valve: The pulmonic valve was not well visualized. Pulmonic valve regurgitation is trivial. Pulmonic regurgitation is trivial. Aorta: The aortic root is normal in size and structure. IAS/Shunts: The interatrial septum was not well visualized.  LEFT VENTRICLE PLAX 2D LVIDd:         4.72 cm       Diastology LVIDs:         3.06 cm       LV e' lateral:   5.55 cm/s LV PW:         1.17 cm       LV E/e' lateral: 12.2 LV IVS:        1.11 cm       LV e' medial:    5.22 cm/s LVOT diam:     2.10 cm       LV E/e' medial:  13.0 LV SV:         67 ml LV SV Index:   28.00 LVOT Area:     3.46 cm  LV Volumes (MOD) LV area d, A2C:    27.50 cm LV area d, A4C:    28.60 cm LV area s, A2C:    15.50 cm LV area s, A4C:    14.30 cm LV major d, A2C:   8.63 cm LV major d, A4C:  8.59 cm LV major s, A2C:   6.89 cm LV major s, A4C:   6.73 cm LV vol d, MOD A2C: 73.6 ml LV vol d, MOD A4C: 78.4 ml LV vol s, MOD A2C: 30.2 ml LV vol s, MOD A4C: 25.8 ml LV SV MOD A2C:     43.4 ml LV SV MOD A4C:     78.4 ml LV SV MOD BP:      47.5 ml RIGHT VENTRICLE RV S prime:     13.40 cm/s TAPSE (M-mode): 1.7 cm LEFT ATRIUM             Index       RIGHT ATRIUM           Index LA diam:        4.00 cm 1.75 cm/m  RA Area:     14.90 cm LA Vol (A2C):   45.6  ml 19.94 ml/m RA Volume:   36.40 ml  15.92 ml/m LA Vol (A4C):   44.3 ml 19.37 ml/m LA Biplane Vol: 46.1 ml 20.16 ml/m  AORTIC VALVE LVOT Vmax:   120.00 cm/s LVOT Vmean:  77.200 cm/s LVOT VTI:    0.244 m  AORTA Ao Root diam: 3.50 cm MITRAL VALVE MV Area (PHT): 2.37 cm             SHUNTS MV PHT:        92.80 msec           Systemic VTI:  0.24 m MV Decel Time: 320 msec             Systemic Diam: 2.10 cm MV E velocity: 67.90 cm/s 103 cm/s MV A velocity: 99.80 cm/s 70.3 cm/s MV E/A ratio:  0.68       1.5  Oswaldo Milian MD Electronically signed by Oswaldo Milian MD Signature Date/Time: 01/14/2020/7:20:50 PM    Final    VAS [name] LOWER EXTREMITY VENOUS (DVT)  Result Date: 01/14/2020  Lower Venous Study Indications: Pulmonary embolism.  Comparison Study: No prior study. Performing Technologist: Maudry Mayhew MHA, RDMS, RVT, RDCS  Examination Guidelines: A complete evaluation includes B-mode imaging, spectral Doppler, color Doppler, and power Doppler as needed of all accessible portions of each vessel. Bilateral testing is considered an integral part of a complete examination. Limited examinations for reoccurring indications may be performed as noted.  +---------+---------------+---------+-----------+----------+--------------+ RIGHT    CompressibilityPhasicitySpontaneityPropertiesThrombus Aging +---------+---------------+---------+-----------+----------+--------------+ CFV      Full           Yes      Yes                                 +---------+---------------+---------+-----------+----------+--------------+ SFJ      Full                                                        +---------+---------------+---------+-----------+----------+--------------+ FV Prox  Full                                                        +---------+---------------+---------+-----------+----------+--------------+ FV Mid   Full                                                         +---------+---------------+---------+-----------+----------+--------------+  FV DistalFull                                                        +---------+---------------+---------+-----------+----------+--------------+ PFV      Full                                                        +---------+---------------+---------+-----------+----------+--------------+ POP      Full           Yes      Yes                                 +---------+---------------+---------+-----------+----------+--------------+ PTV      Full                                                        +---------+---------------+---------+-----------+----------+--------------+ PERO     Full                                                        +---------+---------------+---------+-----------+----------+--------------+   +---------+---------------+---------+-----------+----------+--------------+ LEFT     CompressibilityPhasicitySpontaneityPropertiesThrombus Aging +---------+---------------+---------+-----------+----------+--------------+ CFV      Full           Yes      Yes                                 +---------+---------------+---------+-----------+----------+--------------+ SFJ      Full                                                        +---------+---------------+---------+-----------+----------+--------------+ FV Prox  Full                                                        +---------+---------------+---------+-----------+----------+--------------+ FV Mid   Full                                                        +---------+---------------+---------+-----------+----------+--------------+ FV DistalFull                                                        +---------+---------------+---------+-----------+----------+--------------+   PFV      Full                                                         +---------+---------------+---------+-----------+----------+--------------+ POP      Full           Yes      Yes                                 +---------+---------------+---------+-----------+----------+--------------+ PTV      Full                                                        +---------+---------------+---------+-----------+----------+--------------+ PERO     None                    No                   Acute          +---------+---------------+---------+-----------+----------+--------------+     Summary: Right: There is no evidence of deep vein thrombosis in the lower extremity. No cystic structure found in the popliteal fossa. Left: Findings consistent with acute deep vein thrombosis involving the left peroneal veins. No cystic structure found in the popliteal fossa.  *See table(s) above for measurements and observations.    Preliminary         Scheduled Meds: . carvedilol  25 mg Oral BID WC  . Chlorhexidine Gluconate Cloth  6 each Topical Daily  . pantoprazole  40 mg Oral Daily  . sodium chloride flush  3 mL Intravenous Q12H   Continuous Infusions: . sodium chloride    . heparin 1,000 Units/hr (01/15/20 0744)     LOS: 1 day     Georgette Shell, MD [facility]  If 7PM-7AM, please contact night-coverage [email or web address] Password [facility] 01/15/2020, 9:51 AM

## 2020-01-15 NOTE — Progress Notes (Signed)
ANTICOAGULATION CONSULT NOTE - Follow Up Consult  Pharmacy Consult for heparin--> Eliquis Indication: acute pulmonary embolus and DVT  No Known Allergies  Patient Measurements: Height: 5\' 11"  (180.3 cm) Weight: 236 lb 8.9 oz (107.3 kg) IBW/kg (Calculated) : 75.3 Heparin Dosing Weight: 98 kg  Vital Signs: Temp: 97.8 F (36.6 C) (01/16 0400) Temp Source: Oral (01/16 0400) BP: 159/88 (01/16 0802) Pulse Rate: 76 (01/16 0802)  Labs: Recent Labs    01/13/20 2059 01/13/20 2059 01/13/20 2132 01/13/20 2143 01/13/20 2143 01/13/20 2200 01/13/20 2200 01/14/20 0425 01/14/20 0956 01/14/20 1740 01/15/20 0055  HGB 16.2   < >  --  15.6   < > 15.6   < > 14.4  --   --  14.4  HCT 49.8   < >  --  46.0   < > 46.0  --  45.1  --   --  45.0  PLT 172  --   --   --   --   --   --  153  --   --  161  LABPROT 13.7  --   --   --   --   --   --   --   --   --   --   INR 1.1  --   --   --   --   --   --   --   --   --   --   HEPARINUNFRC  --   --   --   --   --   --   --   --  0.89* 0.76* 0.45  CREATININE 1.48*   < >  --  1.50*  --  1.50*  --  1.40*  --   --   --   TROPONINIHS 19*  --  18*  --   --   --   --   --   --   --   --    < > = values in this interval not displayed.    Estimated Creatinine Clearance: 58.6 mL/min (A) (by C-G formula based on SCr of 1.4 mg/dL (H)).   Assessment: Patient's a 74 y.o M with dysphagia (s/p EGD on 1/12 with dilation performed at the pharynx and biopsies of polyp), presented to the ED on 1/14 with c/o SOB.  Chest CTA showed bilateral PEs with right heart strain and LE doppler was positive for LLE DVT.  He was started on heparin on admission for acute VTE.  Pharmacy was consulted to change to Eliquis on 1/16.  Today, 01/15/2020: - cbc stable - no bleeding documented   Goal of Therapy:  Heparin level 0.3-0.7 units/ml Monitor platelets by anticoagulation protocol: Yes   Plan:  - stop heparin drip - start Eliquis 10 mg bid x7 days then 5 mg bid - monitor  for s/s bleeding  Erion Hermans P 01/15/2020,8:18 AM

## 2020-01-15 NOTE — Progress Notes (Signed)
ANTICOAGULATION CONSULT NOTE - Follow Up Consult  Pharmacy Consult for Heparin Indication: pulmonary embolus  No Known Allergies  Patient Measurements: Height: 5\' 11"  (180.3 cm) Weight: 236 lb 8.9 oz (107.3 kg) IBW/kg (Calculated) : 75.3 Heparin Dosing Weight:   Vital Signs: Temp: 98.4 F (36.9 C) (01/16 0005) Temp Source: Axillary (01/16 0005) BP: 135/54 (01/16 0000) Pulse Rate: 61 (01/16 0000)  Labs: Recent Labs    01/13/20 2059 01/13/20 2059 01/13/20 2132 01/13/20 2143 01/13/20 2143 01/13/20 2200 01/13/20 2200 01/14/20 0425 01/14/20 0956 01/14/20 1740 01/15/20 0055  HGB 16.2   < >  --  15.6   < > 15.6   < > 14.4  --   --  14.4  HCT 49.8   < >  --  46.0   < > 46.0  --  45.1  --   --  45.0  PLT 172  --   --   --   --   --   --  153  --   --  161  LABPROT 13.7  --   --   --   --   --   --   --   --   --   --   INR 1.1  --   --   --   --   --   --   --   --   --   --   HEPARINUNFRC  --   --   --   --   --   --   --   --  0.89* 0.76* 0.45  CREATININE 1.48*   < >  --  1.50*  --  1.50*  --  1.40*  --   --   --   TROPONINIHS 19*  --  18*  --   --   --   --   --   --   --   --    < > = values in this interval not displayed.    Estimated Creatinine Clearance: 58.6 mL/min (A) (by C-G formula based on SCr of 1.4 mg/dL (H)).   Medications:  Infusions:  . sodium chloride    . heparin 1,000 Units/hr (01/14/20 1845)    Assessment: Patient with heparin level at goal.  No heparin issues noted.  Goal of Therapy:  Heparin level 0.3-0.7 units/ml Monitor platelets by anticoagulation protocol: Yes   Plan:  Continue heparin drip at current rate Recheck level at [place], [place] 01/15/2020,2:34 AM

## 2020-01-15 NOTE — Discharge Instructions (Signed)
Information on my medicine - ELIQUIS (apixaban)  This medication education was reviewed with me or my healthcare representative as part of my discharge preparation.    Why was Eliquis prescribed for you? Eliquis was prescribed to treat blood clots that may have been found in the veins of your legs (deep vein thrombosis) or in your lungs (pulmonary embolism) and to reduce the risk of them occurring again.  What do You need to know about Eliquis ? The starting dose is 10 mg (two 5 mg tablets) taken TWICE daily for the FIRST SEVEN (7) DAYS, then on 01/22/2020  the dose is reduced to ONE 5 mg tablet taken TWICE daily.  Eliquis may be taken with or without food.   Try to take the dose about the same time in the morning and in the evening. If you have difficulty swallowing the tablet whole please discuss with your pharmacist how to take the medication safely.  Take Eliquis exactly as prescribed and DO NOT stop taking Eliquis without talking to the doctor who prescribed the medication.  Stopping may increase your risk of developing a new blood clot.  Refill your prescription before you run out.  After discharge, you should have regular check-up appointments with your healthcare provider that is prescribing your Eliquis.    What do you do if you miss a dose? If a dose of ELIQUIS is not taken at the scheduled time, take it as soon as possible on the same day and twice-daily administration should be resumed. The dose should not be doubled to make up for a missed dose.  Important Safety Information A possible side effect of Eliquis is bleeding. You should call your healthcare provider right away if you experience any of the following: ? Bleeding from an injury or your nose that does not stop. ? Unusual colored urine (red or dark brown) or unusual colored stools (red or black). ? Unusual bruising for unknown reasons. ? A serious fall or if you hit your head (even if there is no  bleeding).  Some medicines may interact with Eliquis and might increase your risk of bleeding or clotting while on Eliquis. To help avoid this, consult your healthcare provider or pharmacist prior to using any new prescription or non-prescription medications, including herbals, vitamins, non-steroidal anti-inflammatory drugs (NSAIDs) and supplements.  This website has more information on Eliquis (apixaban): http://www.eliquis.com/eliquis/home

## 2020-01-16 MED ORDER — APIXABAN 5 MG PO TABS: ORAL_TABLET | ORAL | 1 refills | Status: DC

## 2020-01-16 NOTE — Progress Notes (Signed)
Discharge instructions given to patient and patients wife, all questions answered at this time.  Pt. VSS with no s/s of distress noted.  Patient stable at discharge.

## 2020-01-16 NOTE — Discharge Summary (Signed)
Physician Discharge Summary  BOBY PODGORNY [id number] DOB: 08/15/1946 DOA: 01/13/2020  PCP: [name], MD  Admit date: 01/13/2020 Discharge date: 01/16/2020  Admitted From:home Disposition: home Recommendations for Outpatient Follow-up:  1. Follow up with PCP in 1-2 weeks 2. Please obtain BMP/CBC in one week 3. Please follow up with dr gorsuch-hematology  Home Health Equipment/Devices none Discharge Condition: Stable and improved CODE STATUS: Full code Diet recommendation: Cardiac Brief/Interim Summary:74 y.o.malewith medical history significant ofhtn recent egd with dilation 2 days ago comes in with sudden sob that occurred earlier today. No fevers, no cough. No cp n/v/d. No problems swallowing. Pt reports he has chronic swelling to his legs/ankles but has been worse today esp his left ankle. No pain there. No recent trauma, illness, or travel. No prior h/o VTE. Pt found to have bilateral PE and referred for admission for further work up. Vitals have been stable in the ED. Has been started on hep drip.  Discharge Diagnoses:  Principal Problem:   Pulmonary emboli (HCC) Active Problems:   Essential hypertension   Extrinsic asthma   History of peptic ulcer disease   Ankylosing spondylitis (HCC)  #1 bilateral pulmonary embolism in all lobes of the lungs with acute left lower extremity DVT-patient feels much improved with decreased shortness of breath. Ambulated to the bathroom without difficulty.  He was initially treated with heparin for 48 hours and then changed to Eliquis. Hypercoagulable work-up was not done as patient was already on blood thinners when I saw him. CT chest- Pulmonary emboli in all lobes of both lungs. Evidence of right heart strain (RV/LV Ratio = 1.06) consistent with at least submassive (intermediate risk) PE. The presence of right heart strain has been associated with an increased risk of morbidity and mortality  Echo is-Left  ventricular ejection fraction, by visual estimation, is 60 to 65%. The left ventricle has normal function. There is moderately increased left ventricular hypertrophy.  Moderate asymmetric hypertrophy of the basal septum measuring 61mm (posterior wall 54mm) The mitral valve is normal in structure. No evidence of mitral valve regurgitation.  The aortic valve is tricuspid. Aortic valve regurgitation is trivial. Mild aortic valve sclerosis without stenosis.  The tricuspid valve is grossly normal.  The pulmonic valve was not well visualized. Pulmonic valve regurgitation is trivial.  Global right ventricle has normal systolic function.The right ventricular size is normal.  Left atrial size was normal.  Right atrial size was normal.  TR signal is inadequate for assessing pulmonary artery systolic pressure.  Lower extremity Doppler-Right: There is no evidence of deep vein thrombosis in the lower extremity. No cystic structure found in the popliteal fossa. Left: Findings consistent with acute deep vein thrombosis involving the left peroneal veins. No cystic structure found in the popliteal fossa  #2 essential hypertension blood pressure elevated 159/88 restart home meds Coreg 25 mg twice a day, Imdur 60 mg at bedtime.  Continue Norvasc.  #3 extrinsic asthma continue home inhaler  #4 history of peptic ulcer disease status post recent EGD and dilatation continue home meds.  #5 hyperlipidemia continue Lipitor   Estimated body mass index is 32.99 kg/m as calculated from the following:   Height as of this encounter: 5\' 11"  (1.803 m).   Weight as of this encounter: 107.3 kg.  Discharge Instructions   Allergies as of 01/16/2020   No Known Allergies     Medication List    STOP taking these medications   valACYclovir 1000 MG tablet Commonly known as: VALTREX  TAKE these medications   amLODipine 10 MG tablet Commonly known as: NORVASC Take 5 mg by mouth at bedtime.   apixaban 5  MG Tabs tablet Commonly known as: Eliquis Take 2 tablets (10mg ) twice daily for 7 days, then 1 tablet (5mg ) twice daily   atorvastatin 20 MG tablet Commonly known as: LIPITOR Take 20 mg at bedtime by mouth.   carvedilol 25 MG tablet Commonly known as: COREG Take 25 mg 2 (two) times daily by mouth.   isosorbide mononitrate 60 MG 24 hr tablet Commonly known as: IMDUR Take 60 mg by mouth at bedtime.   omeprazole 40 MG capsule Commonly known as: PRILOSEC Take 40 mg by mouth 2 (two) times daily.   predniSONE 5 MG tablet Commonly known as: DELTASONE Take 5 mg by mouth daily with breakfast.   Vitamin D3 50 MCG (2000 UT) capsule Take 2,000 Units by mouth daily.      Follow-up Information    [name], MD Follow up.   Specialty: Family Medicine Contact information: [address] [place] [address] [phone]          No Known Allergies  Consultations:  None   Procedures/Studies: DG Chest 2 View  Result Date: 01/13/2020 CLINICAL DATA:  Shortness of breath EXAM: CHEST - 2 VIEW COMPARISON:  Radiograph 12/15/2018 FINDINGS: Stable calcified granuloma in the left mid lung. Chronically coarsened interstitial changes most pronounced in the left base are similar to prior. No consolidation, features of edema, pneumothorax, or effusion. The cardiomediastinal contours are unremarkable. No acute osseous or soft tissue abnormality. Degenerative changes are present in the imaged spine and shoulders. IMPRESSION: No acute cardiopulmonary abnormality. Electronically Signed   By: Lovena Le M.D.   On: 01/13/2020 16:55   CT Angio Chest PE W/Cm &/Or Wo Cm  Result Date: 01/13/2020 CLINICAL DATA:  Elevated D-dimer. EXAM: CT ANGIOGRAPHY CHEST WITH CONTRAST TECHNIQUE: Multidetector CT imaging of the chest was performed using the standard protocol during bolus administration of intravenous contrast. Multiplanar CT image reconstructions and MIPs were obtained to evaluate  the vascular anatomy. CONTRAST:  22mL OMNIPAQUE IOHEXOL 350 MG/ML SOLN COMPARISON:  06/01/2014 FINDINGS: Cardiovascular: There are bilateral pulmonary emboli in all lobes of both lungs. RV/LV ratio slightly elevated at 1.06. Heart is normal size. Aorta is normal caliber. Scattered coronary artery and aortic calcifications. Mediastinum/Nodes: No mediastinal, hilar, or axillary adenopathy. Calcified mediastinal and left hilar lymph nodes present. Lungs/Pleura: Calcified granuloma in the left upper lobe. No confluent opacities. Upper Abdomen: Imaging into the upper abdomen shows no acute findings. Musculoskeletal: Chest wall soft tissues are unremarkable. No acute bony abnormality. Review of the MIP images confirms the above findings. IMPRESSION: Pulmonary emboli in all lobes of both lungs. Evidence of right heart strain (RV/LV Ratio = 1.06) consistent with at least submassive (intermediate risk) PE. The presence of right heart strain has been associated with an increased risk of morbidity and mortality. Please activate Code PE by paging [phone]. Coronary artery disease Aortic Atherosclerosis (ICD10-I70.0). Critical Value/emergent results were called by telephone at the time of interpretation on 01/13/2020 at 11:28 pm to providerMARCY PFEIFFER , who verbally acknowledged these results. Electronically Signed   By: Rolm Baptise M.D.   On: 01/13/2020 23:30   ECHOCARDIOGRAM COMPLETE  Result Date: 01/14/2020   ECHOCARDIOGRAM REPORT   Patient Name:   WILBER LINDIG Date of Exam: 01/14/2020 Medical Rec #:  [id number]       Height:       71.0 in  Accession #:    [id number]      Weight:       242.0 lb Date of Birth:  August 15, 1946       BSA:          2.29 m Patient Age:    37 years        BP:           127/74 mmHg Patient Gender: M               HR:           74 bpm. Exam Location:  Inpatient Procedure: 2D Echo, Cardiac Doppler and Color Doppler Indications:    Pulmonary Embolus 415.19  History:        Patient has no prior  history of Echocardiogram examinations.                 Risk Factors:Hypertension and Dyslipidemia. History of prostate                 cancer,GERD.  Sonographer:    Alvino Chapel RCS Referring Phys: [place]  1. Left ventricular ejection fraction, by visual estimation, is 60 to 65%. The left ventricle has normal function. There is moderately increased left ventricular hypertrophy.  2. Moderate asymmetric hypertrophy of the basal septum measuring 17mm (posterior wall 5mm)  3. The mitral valve is normal in structure. No evidence of mitral valve regurgitation.  4. The aortic valve is tricuspid. Aortic valve regurgitation is trivial. Mild aortic valve sclerosis without stenosis.  5. The tricuspid valve is grossly normal.  6. The pulmonic valve was not well visualized. Pulmonic valve regurgitation is trivial.  7. Global right ventricle has normal systolic function.The right ventricular size is normal.  8. Left atrial size was normal.  9. Right atrial size was normal. 10. TR signal is inadequate for assessing pulmonary artery systolic pressure. FINDINGS  Left Ventricle: Left ventricular ejection fraction, by visual estimation, is 60 to 65%. The left ventricle has normal function. The left ventricle has no regional wall motion abnormalities. There is moderately increased left ventricular hypertrophy. Asymmetric left ventricular hypertrophy. Left ventricular diastolic parameters were normal. Right Ventricle: The right ventricular size is normal. No increase in right ventricular wall thickness. Global RV systolic function is has normal systolic function. Left Atrium: Left atrial size was normal in size. Right Atrium: Right atrial size was normal in size Pericardium: There is no evidence of pericardial effusion. Mitral Valve: The mitral valve is normal in structure. No evidence of mitral valve regurgitation. Tricuspid Valve: The tricuspid valve is grossly normal. Tricuspid valve regurgitation is not  demonstrated. Aortic Valve: The aortic valve is tricuspid. Aortic valve regurgitation is trivial. Mild aortic valve sclerosis is present, with no evidence of aortic valve stenosis. Pulmonic Valve: The pulmonic valve was not well visualized. Pulmonic valve regurgitation is trivial. Pulmonic regurgitation is trivial. Aorta: The aortic root is normal in size and structure. IAS/Shunts: The interatrial septum was not well visualized.  LEFT VENTRICLE PLAX 2D LVIDd:         4.72 cm       Diastology LVIDs:         3.06 cm       LV e' lateral:   5.55 cm/s LV PW:         1.17 cm       LV E/e' lateral: 12.2 LV IVS:        1.11 cm  LV e' medial:    5.22 cm/s LVOT diam:     2.10 cm       LV E/e' medial:  13.0 LV SV:         67 ml LV SV Index:   28.00 LVOT Area:     3.46 cm  LV Volumes (MOD) LV area d, A2C:    27.50 cm LV area d, A4C:    28.60 cm LV area s, A2C:    15.50 cm LV area s, A4C:    14.30 cm LV major d, A2C:   8.63 cm LV major d, A4C:   8.59 cm LV major s, A2C:   6.89 cm LV major s, A4C:   6.73 cm LV vol d, MOD A2C: 73.6 ml LV vol d, MOD A4C: 78.4 ml LV vol s, MOD A2C: 30.2 ml LV vol s, MOD A4C: 25.8 ml LV SV MOD A2C:     43.4 ml LV SV MOD A4C:     78.4 ml LV SV MOD BP:      47.5 ml RIGHT VENTRICLE RV S prime:     13.40 cm/s TAPSE (M-mode): 1.7 cm LEFT ATRIUM             Index       RIGHT ATRIUM           Index LA diam:        4.00 cm 1.75 cm/m  RA Area:     14.90 cm LA Vol (A2C):   45.6 ml 19.94 ml/m RA Volume:   36.40 ml  15.92 ml/m LA Vol (A4C):   44.3 ml 19.37 ml/m LA Biplane Vol: 46.1 ml 20.16 ml/m  AORTIC VALVE LVOT Vmax:   120.00 cm/s LVOT Vmean:  77.200 cm/s LVOT VTI:    0.244 m  AORTA Ao Root diam: 3.50 cm MITRAL VALVE MV Area (PHT): 2.37 cm             SHUNTS MV PHT:        92.80 msec           Systemic VTI:  0.24 m MV Decel Time: 320 msec             Systemic Diam: 2.10 cm MV E velocity: 67.90 cm/s 103 cm/s MV A velocity: 99.80 cm/s 70.3 cm/s MV E/A ratio:  0.68       1.5  Oswaldo Milian MD Electronically signed by Oswaldo Milian MD Signature Date/Time: 01/14/2020/7:20:50 PM    Final    VAS [name] LOWER EXTREMITY VENOUS (DVT)  Result Date: 01/15/2020  Lower Venous Study Indications: Pulmonary embolism.  Comparison Study: No prior study. Performing Technologist: Maudry Mayhew MHA, RDMS, RVT, RDCS  Examination Guidelines: A complete evaluation includes B-mode imaging, spectral Doppler, color Doppler, and power Doppler as needed of all accessible portions of each vessel. Bilateral testing is considered an integral part of a complete examination. Limited examinations for reoccurring indications may be performed as noted.  +---------+---------------+---------+-----------+----------+--------------+ RIGHT    CompressibilityPhasicitySpontaneityPropertiesThrombus Aging +---------+---------------+---------+-----------+----------+--------------+ CFV      Full           Yes      Yes                                 +---------+---------------+---------+-----------+----------+--------------+ SFJ      Full                                                        +---------+---------------+---------+-----------+----------+--------------+  FV Prox  Full                                                        +---------+---------------+---------+-----------+----------+--------------+ FV Mid   Full                                                        +---------+---------------+---------+-----------+----------+--------------+ FV DistalFull                                                        +---------+---------------+---------+-----------+----------+--------------+ PFV      Full                                                        +---------+---------------+---------+-----------+----------+--------------+ POP      Full           Yes      Yes                                  +---------+---------------+---------+-----------+----------+--------------+ PTV      Full                                                        +---------+---------------+---------+-----------+----------+--------------+ PERO     Full                                                        +---------+---------------+---------+-----------+----------+--------------+   +---------+---------------+---------+-----------+----------+--------------+ LEFT     CompressibilityPhasicitySpontaneityPropertiesThrombus Aging +---------+---------------+---------+-----------+----------+--------------+ CFV      Full           Yes      Yes                                 +---------+---------------+---------+-----------+----------+--------------+ SFJ      Full                                                        +---------+---------------+---------+-----------+----------+--------------+ FV Prox  Full                                                        +---------+---------------+---------+-----------+----------+--------------+  FV Mid   Full                                                        +---------+---------------+---------+-----------+----------+--------------+ FV DistalFull                                                        +---------+---------------+---------+-----------+----------+--------------+ PFV      Full                                                        +---------+---------------+---------+-----------+----------+--------------+ POP      Full           Yes      Yes                                 +---------+---------------+---------+-----------+----------+--------------+ PTV      Full                                                        +---------+---------------+---------+-----------+----------+--------------+ PERO     None                    No                   Acute           +---------+---------------+---------+-----------+----------+--------------+     Summary: Right: There is no evidence of deep vein thrombosis in the lower extremity. No cystic structure found in the popliteal fossa. Left: Findings consistent with acute deep vein thrombosis involving the left peroneal veins. No cystic structure found in the popliteal fossa.  *See table(s) above for measurements and observations. Electronically signed by Monica Martinez MD on 01/15/2020 at 1:47:25 PM.    Final     (Echo, Carotid, EGD, Colonoscopy, ERCP)    Subjective: Patient is awake alert sitting up by the side of the bed eating breakfast doing better walked to the bathroom denies shortness of breath await PT eval  Discharge Exam: Vitals:   01/16/20 0800 01/16/20 0809  BP: (!) 161/84   Pulse: 84   Resp:    Temp:  98.2 F (36.8 C)  SpO2: 96%    Vitals:   01/16/20 0600 01/16/20 0739 01/16/20 0800 01/16/20 0809  BP: (!) 156/95 (!) 149/90 (!) 161/84   Pulse: 76 75 84   Resp: 10 15    Temp:    98.2 F (36.8 C)  TempSrc:    Oral  SpO2: 94% 95% 96%   Weight:      Height:        General: Pt is alert, awake, not in acute distress Cardiovascular: RRR, S1/S2 +, no rubs, no gallops Respiratory: CTA bilaterally, no wheezing, no rhonchi Abdominal: Soft, NT, ND,  bowel sounds + Extremities: 1+ edema bilateral lower extremity  The results of significant diagnostics from this hospitalization (including imaging, microbiology, ancillary and laboratory) are listed below for reference.     Microbiology: Recent Results (from the past 240 hour(s))  SARS Coronavirus 2 ([facility] Endo/Gastro ONLY)     Status: None   Collection Time: 01/07/20 12:00 AM   Specimen: Nasopharyngeal Swab  Result Value Ref Range Status   SARS Coronavirus 2 RESULT:  NEGATIVE  Final    Comment: RESULT:  NEGATIVESARS-CoV-2 INTERPRETATION:A NEGATIVE  test result means that SARS-CoV-2 RNA was not present in the specimen above the limit of  detection of this test. This does not preclude a possible SARS-CoV-2 infection and should not be used as the  sole basis for patient management decisions. Negative results must be combined with clinical observations, patient history, and epidemiological information. Optimum specimen types and timing for peak viral levels during infections caused by SARS-CoV-2  have not been determined. Collection of multiple specimens or types of specimens may be necessary to detect virus. Improper specimen collection and handling, sequence variability under primers/probes, or organism present below the limit of detection may  lead to false negative results. Positive and negative predictive values of testing are highly dependent on prevalence. False negative test results are more likely when prevalence of disease is high.The expected result is NEGATIVE.Fact  Sheet for  Healthcare Providers: https://www.[employer].com/.Fact Sheet for Patients: SugarRoll.be.Normal Reference Range - Negative   Respiratory Panel by RT PCR (Flu A&B, Covid) - Nasopharyngeal Swab     Status: None   Collection Time: 01/13/20  8:59 PM   Specimen: Nasopharyngeal Swab  Result Value Ref Range Status   SARS Coronavirus 2 by RT PCR NEGATIVE NEGATIVE Final    Comment: (NOTE) SARS-CoV-2 target nucleic acids are NOT DETECTED. The SARS-CoV-2 RNA is generally detectable in upper respiratoy specimens during the acute phase of infection. The lowest concentration of SARS-CoV-2 viral copies this assay can detect is 131 copies/mL. A negative result does not preclude SARS-Cov-2 infection and should not be used as the sole basis for treatment or other patient management decisions. A negative result may occur with  improper specimen collection/handling, submission of specimen other than nasopharyngeal swab, presence of viral mutation(s) within the areas targeted by this assay, and inadequate number of viral  copies (<131 copies/mL). A negative result must be combined with clinical observations, patient history, and epidemiological information. The expected result is Negative. Fact Sheet for Patients:  PinkCheek.be Fact Sheet for Healthcare Providers:  GravelBags.it This test is not yet ap proved or cleared by the [place] FDA and  has been authorized for detection and/or diagnosis of SARS-CoV-2 by FDA under an Emergency Use Authorization (EUA). This EUA will remain  in effect (meaning this test can be used) for the duration of the COVID-19 declaration under Section 564(b)(1) of the Act, 21 U.S.C. section 360bbb-3(b)(1), unless the authorization is terminated or revoked sooner.    Influenza A by PCR NEGATIVE NEGATIVE Final   Influenza B by PCR NEGATIVE NEGATIVE Final    Comment: (NOTE) The Xpert Xpress SARS-CoV-2/FLU/RSV assay is intended as an aid in  the diagnosis of influenza from Nasopharyngeal swab specimens and  should not be used as a sole basis for treatment. Nasal washings and  aspirates are unacceptable for Xpert Xpress SARS-CoV-2/FLU/RSV  testing. Fact Sheet for Patients: PinkCheek.be Fact Sheet for Healthcare Providers: GravelBags.it This test is not yet approved or cleared by the [place] FDA and  has  been authorized for detection and/or diagnosis of SARS-CoV-2 by  FDA under an Emergency Use Authorization (EUA). This EUA will remain  in effect (meaning this test can be used) for the duration of the  Covid-19 declaration under Section 564(b)(1) of the Act, 21  U.S.C. section 360bbb-3(b)(1), unless the authorization is  terminated or revoked. Performed at [facility], [place] [address] [place], [place] [address]   MRSA PCR Screening     Status: None   Collection Time: 01/14/20  4:58 PM   Specimen: Nasal Mucosa; Nasopharyngeal   Result Value Ref Range Status   MRSA by PCR NEGATIVE NEGATIVE Final    Comment:        The GeneXpert MRSA Assay (FDA approved for NASAL specimens only), is one component of a comprehensive MRSA colonization surveillance program. It is not intended to diagnose MRSA infection nor to guide or monitor treatment for MRSA infections. Performed at [facility], [place] [address], [place],  [address]      Labs: BNP (last 3 results) Recent Labs    01/13/20 2059  BNP 0000000   Basic Metabolic Panel: Recent Labs  Lab 01/13/20 2059 01/13/20 2143 01/13/20 2200 01/14/20 0425  NA 140 141 140 141  K 4.1 4.0 4.1 3.7  CL 107 103 104 107  CO2 26  --   --  26  GLUCOSE 102* 92 95 94  BUN 21 21 24* 19  CREATININE 1.48* 1.50* 1.50* 1.40*  CALCIUM 8.6*  --   --  8.2*   Liver Function Tests: Recent Labs  Lab 01/13/20 2059  AST 19  ALT 25  ALKPHOS 67  BILITOT 1.2  PROT 6.5  ALBUMIN 3.6   Recent Labs  Lab 01/13/20 2059  LIPASE 37   No results for input(s): AMMONIA in the last 168 hours. CBC: Recent Labs  Lab 01/13/20 2059 01/13/20 2143 01/13/20 2200 01/14/20 0425 01/15/20 0055  WBC 9.9  --   --  8.8 7.6  NEUTROABS 7.5  --   --   --   --   HGB 16.2 15.6 15.6 14.4 14.4  HCT 49.8 46.0 46.0 45.1 45.0  MCV 89.9  --   --  89.8 91.3  PLT 172  --   --  153 161   Cardiac Enzymes: No results for input(s): CKTOTAL, CKMB, CKMBINDEX, TROPONINI in the last 168 hours. BNP: Invalid input(s): POCBNP CBG: No results for input(s): GLUCAP in the last 168 hours. D-Dimer Recent Labs    01/13/20 2059  DDIMER 11.50*   Hgb A1c No results for input(s): HGBA1C in the last 72 hours. Lipid Profile No results for input(s): CHOL, HDL, LDLCALC, TRIG, CHOLHDL, LDLDIRECT in the last 72 hours. Thyroid function studies No results for input(s): TSH, T4TOTAL, T3FREE, THYROIDAB in the last 72 hours.  Invalid input(s): FREET3 Anemia work up No results for input(s):  VITAMINB12, FOLATE, FERRITIN, TIBC, IRON, RETICCTPCT in the last 72 hours. Urinalysis    Component Value Date/Time   COLORURINE YELLOW 01/13/2020 2059   APPEARANCEUR CLEAR 01/13/2020 2059   LABSPEC 1.020 01/13/2020 2059   PHURINE 5.0 01/13/2020 2059   GLUCOSEU NEGATIVE 01/13/2020 2059   HGBUR SMALL (A) 01/13/2020 2059   HGBUR large 02/26/2010 1214   BILIRUBINUR NEGATIVE 01/13/2020 2059   BILIRUBINUR 1+ 06/26/2016 [place] 01/13/2020 2059   PROTEINUR NEGATIVE 01/13/2020 2059   UROBILINOGEN 1.0 06/26/2016 1117   UROBILINOGEN 1.0 03/05/2010 2252   NITRITE NEGATIVE 01/13/2020 2059  LEUKOCYTESUR NEGATIVE 01/13/2020 2059   Sepsis Labs Invalid input(s): PROCALCITONIN,  WBC,  LACTICIDVEN Microbiology Recent Results (from the past 240 hour(s))  SARS Coronavirus 2 ([facility] Endo/Gastro ONLY)     Status: None   Collection Time: 01/07/20 12:00 AM   Specimen: Nasopharyngeal Swab  Result Value Ref Range Status   SARS Coronavirus 2 RESULT:  NEGATIVE  Final    Comment: RESULT:  NEGATIVESARS-CoV-2 INTERPRETATION:A NEGATIVE  test result means that SARS-CoV-2 RNA was not present in the specimen above the limit of detection of this test. This does not preclude a possible SARS-CoV-2 infection and should not be used as the  sole basis for patient management decisions. Negative results must be combined with clinical observations, patient history, and epidemiological information. Optimum specimen types and timing for peak viral levels during infections caused by SARS-CoV-2  have not been determined. Collection of multiple specimens or types of specimens may be necessary to detect virus. Improper specimen collection and handling, sequence variability under primers/probes, or organism present below the limit of detection may  lead to false negative results. Positive and negative predictive values of testing are highly dependent on prevalence. False negative test results are more likely when  prevalence of disease is high.The expected result is NEGATIVE.Fact  Sheet for  Healthcare Providers: https://www.[employer] [employer].com/.Fact Sheet for Patients: SugarRoll.be.Normal Reference Range - Negative   Respiratory Panel by RT PCR (Flu A&B, Covid) - Nasopharyngeal Swab     Status: None   Collection Time: 01/13/20  8:59 PM   Specimen: Nasopharyngeal Swab  Result Value Ref Range Status   SARS Coronavirus 2 by RT PCR NEGATIVE NEGATIVE Final    Comment: (NOTE) SARS-CoV-2 target nucleic acids are NOT DETECTED. The SARS-CoV-2 RNA is generally detectable in upper respiratoy specimens during the acute phase of infection. The lowest concentration of SARS-CoV-2 viral copies this assay can detect is 131 copies/mL. A negative result does not preclude SARS-Cov-2 infection and should not be used as the sole basis for treatment or other patient management decisions. A negative result may occur with  improper specimen collection/handling, submission of specimen other than nasopharyngeal swab, presence of viral mutation(s) within the areas targeted by this assay, and inadequate number of viral copies (<131 copies/mL). A negative result must be combined with clinical observations, patient history, and epidemiological information. The expected result is Negative. Fact Sheet for Patients:  PinkCheek.be Fact Sheet for Healthcare Providers:  GravelBags.it This test is not yet ap proved or cleared by the [place] FDA and  has been authorized for detection and/or diagnosis of SARS-CoV-2 by FDA under an Emergency Use Authorization (EUA). This EUA will remain  in effect (meaning this test can be used) for the duration of the COVID-19 declaration under Section 564(b)(1) of the Act, 21 U.S.C. section 360bbb-3(b)(1), unless the authorization is terminated or revoked sooner.    Influenza A by PCR NEGATIVE  NEGATIVE Final   Influenza B by PCR NEGATIVE NEGATIVE Final    Comment: (NOTE) The Xpert Xpress SARS-CoV-2/FLU/RSV assay is intended as an aid in  the diagnosis of influenza from Nasopharyngeal swab specimens and  should not be used as a sole basis for treatment. Nasal washings and  aspirates are unacceptable for Xpert Xpress SARS-CoV-2/FLU/RSV  testing. Fact Sheet for Patients: PinkCheek.be Fact Sheet for Healthcare Providers: GravelBags.it This test is not yet approved or cleared by the [place] FDA and  has been authorized for detection and/or diagnosis of SARS-CoV-2 by  FDA under an Emergency Use Authorization (EUA). This  EUA will remain  in effect (meaning this test can be used) for the duration of the  Covid-19 declaration under Section 564(b)(1) of the Act, 21  U.S.C. section 360bbb-3(b)(1), unless the authorization is  terminated or revoked. Performed at [facility], [place] [address] [place], [place] [address]   MRSA PCR Screening     Status: None   Collection Time: 01/14/20  4:58 PM   Specimen: Nasal Mucosa; Nasopharyngeal  Result Value Ref Range Status   MRSA by PCR NEGATIVE NEGATIVE Final    Comment:        The GeneXpert MRSA Assay (FDA approved for NASAL specimens only), is one component of a comprehensive MRSA colonization surveillance program. It is not intended to diagnose MRSA infection nor to guide or monitor treatment for MRSA infections. Performed at [facility], [place] [address] [place], [place] [address]      Time coordinating discharge:  35 minutes  SIGNED:   Georgette Shell, MD  [facility] 01/16/2020, 9:14 AM Pager   If 7PM-7AM, please contact night-coverage [email or web address] Password [email or web address]

## 2020-01-16 NOTE — Evaluation (Signed)
Physical Therapy Evaluation Patient Details Name: Bruce Ball MRN: [id number] DOB: 1946/09/03 Today's Date: 01/16/2020   History of Present Illness  74 yo male admitted with bil PE, L LE DVT. Hx of hearing loss, falls, chronic pain  Clinical Impression  On eval, pt was Supv level for mobility. He walked ~275 feet around the unit. Pt tolerated activity well. O2 95% on RA during ambulation. He denied dyspnea. Discussed d/c plan-pt plans to return home later today where he lives with his wife. He politely declines any PT f/u. Will follow during hospital stay.     Follow Up Recommendations No PT follow up(pt politely declines PT f/u-he feels it makes his pain worse)    Equipment Recommendations  None recommended by PT    Recommendations for Other Services       Precautions / Restrictions Precautions Precautions: Fall Restrictions Weight Bearing Restrictions: No      Mobility  Bed Mobility Overal bed mobility: Modified Independent             General bed mobility comments: Increased time. Pt uses logroll 2* chronic back pain  Transfers Overall transfer level: Needs assistance Equipment used: Rolling walker (2 wheeled) Transfers: Sit to/from Stand Sit to Stand: Supervision         General transfer comment: for safety  Ambulation/Gait Ambulation/Gait assistance: Supervision Gait Distance (Feet): 275 Feet Assistive device: Rolling walker (2 wheeled) Gait Pattern/deviations: Step-through pattern;Decreased stride length     General Gait Details: No LOB with RW use. Pt tolerated distance well. O2 95% on RA. Pt denies dyspnea.  Stairs            Wheelchair Mobility    Modified Rankin (Stroke Patients Only)       Balance Overall balance assessment: History of Falls;Mild deficits observed, not formally tested(mild deficits with use of RW)                                           Pertinent Vitals/Pain Pain Assessment: 0-10 Pain  Score: 7  Pain Location: back, hips Pain Descriptors / Indicators: Discomfort;Sore Pain Intervention(s): Monitored during session;Repositioned    Home Living Family/patient expects to be discharged to:: Private residence Living Arrangements: Spouse/significant other Available Help at Discharge: Family Type of Home: House       Home Layout: Able to live on main level with bedroom/bathroom Home Equipment: Walker - 4 wheels;Electric scooter      Prior Function Level of Independence: Independent with assistive device(s)         Comments: uses RW PRN vs scooter PRN     Hand Dominance        Extremity/Trunk Assessment   Upper Extremity Assessment Upper Extremity Assessment: Generalized weakness    Lower Extremity Assessment Lower Extremity Assessment: Generalized weakness    Cervical / Trunk Assessment Cervical / Trunk Assessment: Normal  Communication   Communication: HOH  Cognition Arousal/Alertness: Awake/alert Behavior During Therapy: WFL for tasks assessed/performed Overall Cognitive Status: Within Functional Limits for tasks assessed                                        General Comments      Exercises     Assessment/Plan    PT Assessment Patient needs continued PT services  PT Problem  List Decreased mobility;Decreased balance;Pain       PT Treatment Interventions DME instruction;Gait training;Therapeutic activities;Therapeutic exercise;Patient/family education;Balance training;Functional mobility training    PT Goals (Current goals can be found in the Care Plan section)  Acute Rehab PT Goals Patient Stated Goal: home PT Goal Formulation: With patient Time For Goal Achievement: 01/30/20 Potential to Achieve Goals: Good    Frequency Min 3X/week   Barriers to discharge        Co-evaluation               AM-PAC PT "6 Clicks" Mobility  Outcome Measure Help needed turning from your back to your side while in a flat  bed without using bedrails?: None Help needed moving from lying on your back to sitting on the side of a flat bed without using bedrails?: None Help needed moving to and from a bed to a chair (including a wheelchair)?: None Help needed standing up from a chair using your arms (e.g., wheelchair or bedside chair)?: None Help needed to walk in hospital room?: A Little Help needed climbing 3-5 steps with a railing? : A Little 6 Click Score: 22    End of Session   Activity Tolerance: Patient tolerated treatment well Patient left: in bed;with call bell/phone within reach   PT Visit Diagnosis: Pain;History of falling (Z91.81);Muscle weakness (generalized) (M62.81);Difficulty in walking, not elsewhere classified (R26.2)    Time: [id number] PT Time Calculation (min) (ACUTE ONLY): 9 min   Charges:   PT Evaluation $PT Eval Moderate Complexity: 1 Mod             Bahar Shelden P, PT Acute Rehabilitation

## 2020-01-17 ENCOUNTER — Telehealth: Payer: Self-pay

## 2020-01-17 ENCOUNTER — Telehealth: Payer: Self-pay | Admitting: Family Medicine

## 2020-01-17 LAB — POCT I-STAT, CHEM 8
BUN: 21 mg/dL (ref 8–23)
Calcium, Ion: 0.85 mmol/L — CL (ref 1.15–1.40)
Chloride: 103 mmol/L (ref 98–111)
Creatinine, Ser: 1.5 mg/dL — ABNORMAL HIGH (ref 0.61–1.24)
Glucose, Bld: 92 mg/dL (ref 70–99)
HCT: 46 % (ref 39.0–52.0)
Hemoglobin: 15.6 g/dL (ref 13.0–17.0)
Potassium: 4 mmol/L (ref 3.5–5.1)
Sodium: 141 mmol/L (ref 135–145)
TCO2: 30 mmol/L (ref 22–32)

## 2020-01-17 NOTE — Telephone Encounter (Signed)
Patients wife, Bruce Ball calls in to schedule her husband for a hospital follow up, for the pulmonary issues. The next available is not until 02/14/20, patients wife wanted to know if he could get in sooner than that date.

## 2020-01-17 NOTE — Telephone Encounter (Signed)
Ok to use same day?

## 2020-01-17 NOTE — Telephone Encounter (Signed)
Transition Care Management Follow-up Telephone Call  Date of discharge and from where: [facility] 01/16/20  How have you been since you were released from the hospital? Improved   Any questions or concerns? No   Items Reviewed:  Did the pt receive and understand the discharge instructions provided? Yes   Medications obtained and verified? Yes   Any new allergies since your discharge? No   Dietary orders reviewed? Yes  Do you have support at home? Yes   Other (ie: DME, Home Health, etc) n/A   Functional Questionnaire: (I = Independent and D = Dependent) ADL's: Independent  Bathing/Dressing- Independent   Meal Prep- Independent  Eating- Independent  Maintaining continence- Independent  Transferring/Ambulation- Independent  Managing Meds- Independent   Follow up appointments reviewed:    PCP Hospital f/u appt confirmed? Yes  Scheduled to see Dr. Yong Channel on 01/17/20 @ 10.  [facility] f/u appt confirmed? Patient is to followup with Dr. Alvy Bimler (Hematology) [phone]   Are transportation arrangements needed? No   If their condition worsens, is the pt aware to call  their PCP or go to the ED? Yes  Was the patient provided with contact information for the PCP's office or ED? Yes  Was the pt encouraged to call back with questions or concerns? Yes

## 2020-01-18 ENCOUNTER — Encounter: Payer: Self-pay | Admitting: Family Medicine

## 2020-01-18 ENCOUNTER — Ambulatory Visit (INDEPENDENT_AMBULATORY_CARE_PROVIDER_SITE_OTHER): Payer: Medicare Other | Admitting: Family Medicine

## 2020-01-18 ENCOUNTER — Telehealth: Payer: Self-pay | Admitting: Hematology and Oncology

## 2020-01-18 VITALS — BP 132/72 | HR 66 | Temp 98.1°F | Ht 71.0 in | Wt 236.6 lb

## 2020-01-18 DIAGNOSIS — H832X9 Labyrinthine dysfunction, unspecified ear: Secondary | ICD-10-CM

## 2020-01-18 DIAGNOSIS — M450 Ankylosing spondylitis of multiple sites in spine: Secondary | ICD-10-CM | POA: Diagnosis not present

## 2020-01-18 DIAGNOSIS — I10 Essential (primary) hypertension: Secondary | ICD-10-CM | POA: Diagnosis not present

## 2020-01-18 DIAGNOSIS — N183 Chronic kidney disease, stage 3 unspecified: Secondary | ICD-10-CM

## 2020-01-18 DIAGNOSIS — K219 Gastro-esophageal reflux disease without esophagitis: Secondary | ICD-10-CM | POA: Diagnosis not present

## 2020-01-18 DIAGNOSIS — I2699 Other pulmonary embolism without acute cor pulmonale: Secondary | ICD-10-CM | POA: Diagnosis not present

## 2020-01-18 LAB — COMPREHENSIVE METABOLIC PANEL
ALT: 29 U/L (ref 0–53)
AST: 24 U/L (ref 0–37)
Albumin: 3.5 g/dL (ref 3.5–5.2)
Alkaline Phosphatase: 56 U/L (ref 39–117)
BUN: 19 mg/dL (ref 6–23)
CO2: 30 mEq/L (ref 19–32)
Calcium: 8.7 mg/dL (ref 8.4–10.5)
Chloride: 106 mEq/L (ref 96–112)
Creatinine, Ser: 1.42 mg/dL (ref 0.40–1.50)
GFR: 48.79 mL/min — ABNORMAL LOW (ref >60.00)
Glucose, Bld: 95 mg/dL (ref 70–99)
Potassium: 4.3 mEq/L (ref 3.5–5.1)
Sodium: 142 mEq/L (ref 135–145)
Total Bilirubin: 0.7 mg/dL (ref 0.2–1.2)
Total Protein: 5.7 g/dL — ABNORMAL LOW (ref 6.0–8.3)

## 2020-01-18 LAB — CBC WITH DIFFERENTIAL/PLATELET
Basophils Absolute: 0 10*3/uL (ref 0.0–0.1)
Basophils Relative: 0.4 % (ref 0.0–3.0)
Eosinophils Absolute: 0.1 10*3/uL (ref 0.0–0.7)
Eosinophils Relative: 0.9 % (ref 0.0–5.0)
HCT: 44.5 % (ref 39.0–52.0)
Hemoglobin: 14.8 g/dL (ref 13.0–17.0)
Lymphocytes Relative: 14.7 % (ref 12.0–46.0)
Lymphs Abs: 1.3 10*3/uL (ref 0.7–4.0)
MCHC: 33.1 g/dL (ref 30.0–36.0)
MCV: 88.9 fl (ref 78.0–100.0)
Monocytes Absolute: 0.9 10*3/uL (ref 0.1–1.0)
Monocytes Relative: 9.9 % (ref 3.0–12.0)
Neutro Abs: 6.7 10*3/uL (ref 1.4–7.7)
Neutrophils Relative %: 74.1 % (ref 43.0–77.0)
Platelets: 179 10*3/uL (ref 150.0–400.0)
RBC: 5.01 Mil/uL (ref 4.22–5.81)
RDW: 15.1 % (ref 11.5–15.5)
WBC: 9.1 10*3/uL (ref 4.0–10.5)

## 2020-01-18 NOTE — Telephone Encounter (Signed)
[name] has been cld and scheduled to see Dr. Alvy Bimler on 1/28 at 12:30pm. He's been made aware to arrive 20 minutes early.

## 2020-01-18 NOTE — Progress Notes (Signed)
Phone [phone]   Subjective:  Bruce Ball is a 74 y.o. year old very pleasant male patient who presents for transitional care management and hospital follow up for pulmonary embolism. Patient was hospitalized from 01/13/2020 to 01/16/2020. A TCM phone call was completed on 01/17/2020 . Medical complexity high.  Hospital discharge report reviewed and summarized below.  Bruce Ball is a 74 year old male with history of multiple musculoskeletal concerns including possible ankylosing spondylitis (1 rheumatologist diagnosis him with this and another has said it is not ankylosing spondylitis) on long-term prednisone, ongoing balance issues, history of prostate cancer, hyperlipidemia, asthma, hypertension, GERD with recent EGD and esophageal stretching 2 days prior to admission who presented with sudden worsening of shortness of breath and was found to have bilateral pulmonary embolism.  Pulmonary embolism was extensive in all lobes of the lung-also found to have left lower extremity DVT.  He was treated with heparin for 48 hours and then changed to Eliquis.  He required hospitalization due to evidence of right heart strain which was considered intermediate risk for more severe event/morbidity/mortality.  Echocardiogram showed EF of 60 to 65% with moderately increased left ventricular hypertrophy and moderate asymmetric hypertrophy of the basal septum.  Had trivial aortic valve regurgitation and mild aortic valve sclerosis without stenosis.  In retrospect-Patient reports has had some mild shortness of breath for at least a month.  He has had chronic pain in both lower legs without any acute recent worsening.  Never had calf pain.  Had some left shin pain.  He reports Had some episodes of acid reflux in the past which he associates with shortness of breath apparently if has regurgitation and he feels like gets in lung- felt like was having an episode after endoscopy on 01/11/2020. By time he went to the ER had  shortness of breath with minimal exertion.   Review of labs during hospitalization show last CBC without anemia or other abnormalities. Last BMP shows Cr at 1.4 which would be a GFR of 49 in CKD stage III range.    Patient's essential hypertension was elevated while hospitalized to-home medications of carvedilol and Imdur were restarted.  Amlodipine was continued.  Asthma was well controlled while hospitalized.  They have albuterol available as needed  History of peptic ulcer disease status post recent EGD and dilation-no recent evidence of peptic ulcer.  For hyperlipidemia he was continued on Lipitor  Since being discharged patient states he was breathing much better than he was previously.  They wonder if sedentary activity due to chronic pains was the cause of clotting.  He really misses being in water classes and is hoping to restart those.  In regards to cancer screenings-we discussed that we need to make sure he is up-to-date due to possibility of "provoked" DVT related to a cancer.  We are pending a Cologuard.  He follows with Dr. Bess Harvest for history of prostate cancer with last PSA stable in June 2020.  He quit smoking in 1980 and has not a candidate for lung cancer screening.  Patient also feels like his dizziness/vertigo are slightly better.  He does have an upcoming appointment with ENT-they had specifically requested ENT outside of the [place].  His wife would like him to consider physical therapy-patient has not found that particularly helpful in the past for balance issues.  He reports regular physical therapy caused pain so he declines referral-   he denies any headaches.  [facility] also [facility]-sounds like he may have had some biopsies which also reportedly were normal.  I personally reviewed the chest x-ray from 01/13/2020.  2 views noted and reviewed.  Old calcified granuloma in left midlung noted.  Some chronic interstitial changes  at lung bases which appear unchanged from prior x-rays.  No obvious consolidations.  No pneumothorax noted.  Heart borders are normal.  No acute process.  No bony abnormalities other than arthritic changes in thoracic and cervical spine and shoulders  See problem oriented charting as well  Past Medical History-  Patient Active Problem List   Diagnosis Date Noted   Pulmonary emboli ([place]) 01/14/2020    Priority: High   High risk medication use 02/28/2017    Priority: High   Ataxia 05/18/2016    Priority: High   Vestibular disequilibrium 05/18/2016    Priority: High   Ankylosing spondylitis ([place]) 05/24/2014    Priority: High   GERD (gastroesophageal reflux disease) 12/22/2019    Priority: Medium   Essential tremor 12/22/2019    Priority: Medium   Hyperlipidemia 11/11/2017    Priority: Medium   Right ureteral stone 11/05/2017    Priority: Medium   History of prostate cancer 03/08/2017    Priority: Medium   Hx of chondrosarcoma 02/28/2017    Priority: Medium   Extrinsic asthma 04/07/2009    Priority: Medium   LOW BACK PAIN 04/21/2008    Priority: Medium   History of peptic ulcer disease 04/21/2008    Priority: Medium   Essential hypertension 10/29/2007    Priority: Medium   Acute pain of right knee 07/15/2019    Priority: Low   Community acquired pneumonia of right upper lobe of lung 11/05/2017    Priority: Low   History of total right hip replacement 03/08/2017    Priority: Low   DDD (degenerative disc disease), thoracic 02/28/2017    Priority: Low   Peripheral edema 06/08/2012    Priority: Low   Chronic kidney disease (CKD), stage III (moderate) 01/19/2020   Gross hematuria 06/14/2019   Lung nodule, solitary 05/24/2014    Medications- reviewed and updated  A medical reconciliation was performed comparing current medicines to hospital discharge medications. Current Outpatient Medications  Medication Sig Dispense Refill   amLODipine (NORVASC)  10 MG tablet Take 5 mg by mouth at bedtime.      apixaban (ELIQUIS) 5 MG TABS tablet Take 2 tablets (10mg ) twice daily for 7 days, then 1 tablet (5mg ) twice daily 60 tablet 1   atorvastatin (LIPITOR) 20 MG tablet Take 20 mg at bedtime by mouth.      carvedilol (COREG) 25 MG tablet Take 25 mg 2 (two) times daily by mouth.      Cholecalciferol (VITAMIN D3) 50 MCG (2000 UT) capsule Take 2,000 Units by mouth daily.     isosorbide mononitrate (IMDUR) 60 MG 24 hr tablet Take 60 mg by mouth at bedtime.      omeprazole (PRILOSEC) 40 MG capsule Take 40 mg by mouth 2 (two) times daily.     predniSONE (DELTASONE) 5 MG tablet Take 5 mg by mouth daily with breakfast.     No current facility-administered medications for this visit.   Objective  Objective:  BP 132/72    Pulse 66    Temp 98.1 F (36.7 C) (Temporal)    Ht 5\' 11"  (1.803 m)    Wt 236 lb 9.6 oz (107.3 kg)    SpO2 96%    BMI 33.00 kg/m  Gen: NAD, resting  comfortably CV: RRR no murmurs rubs or gallops Lungs: CTAB no crackles, wheeze, rhonchi  Ext: trace bilateral edema- patient has pain in ankles but no calf pain or swelling Skin: warm, dry   Assessment and Plan:   Pulmonary emboli (HCC) Left lower extremity DVT and bilateral PE noted January 13 2020 and started on anticoagulation.  Patient is compliant with Eliquis at high dose and knows to convert to 5 mg twice daily after 1 week.  Patient is already set up with hematology for consult.  This could possibly be considered provoked due to sedentary activity but with chronic musculoskeletal issues unclear if he would ever be able to be nonsedentary though he did do better when was able to do pool exercises.  Patient would like to consider hypercoagulability work-up as well  I was honest with the husband and wife that time course of this is not completely clear.  Patient did have some mild shortness of breath at last visit here and he thought this was related to sedentary activity-appears  to have improved after treatment with Eliquis.  Patient did not have unilateral swelling of the leg or calf to lead [name] to suspect DVT previously-unclear if that would have been present at the last visit-patient continues to not have calf pain or swelling at this time.  At this point, thankful patient sought care when he did and time course likely to remain unclear  Essential hypertension Good control of blood pressure even with reduction of amlodipine to 5 mg. Bilateral edema appears improved and only noted as trace today. Continue amlodipine 5 m, coreg 25mg  BID, imdur 60 mg   Vestibular disequilibrium From last note "At the time of emergency room visit he was suffering from some dizziness.  He has had ongoing issues with dizziness/vertigo for years.  [facility] and no concrete [facility] were discovered.  Vestibular disequilibrium was listed by hospitalist approximately 4 years ago and remains on problem list.  He has also had ataxia previously listed.  He mentions-Has tinnitus and hearing loss in right ear.  He thinks that Mnire's was ruled out."  Issues have persisted. Please note in ER visit in November he had MR brain and MR angiogram which were largely reassuring other than small vessel disease.   Patient is scheduled for a visit with Dr. Lucia Gaskins of ENT for his opinion within the next 2 weeks.  We opted to defer decision on physical therapy/vestibular rehab for his opinion on a provider for this.  Patient has done some normal physical therapy in the past and not found helpful for gait/balance  If issues are not thought to be ENT in origin could consider neurology referral.  Ankylosing spondylitis [facility]) Patient followed by [facility] rheumatology-there have been conflicting opinions in the past about this diagnosis.  At present he remains on chronic prednisone 5 mg.  He was rather sedentary as a result of multiple musculoskeletal issues.  He has help me get back to the pool  when able.  For now.  Stable-current medications will be continued -We do not have formal record of this but in the past it has sounded like patient has been on Reclast since he is on long-term prednisone  GERD (gastroesophageal reflux disease) Patient remains on high-dose omeprazole 40 mg twice a day-had EGD with esophageal stretching on the 12th.  Appears stable-continue current medications  Chronic kidney disease (CKD), stage III (moderate) GFR in high 40s or 50s typically and noted again today -  we will add diagnosis of chronic kidney disease stage III-patient should avoid NSAIDs due to being on anticoagulant but also to protect kidney function-this will be communicated to patient by nursing staff.  Recommended follow up: Return in about 4 months (around 05/17/2020).  Or sooner if needed Future Appointments  Date Time Provider [place]  01/24/2020  1:00 PM Rozetta Nunnery, MD [facility] None  01/27/2020 12:30 PM [name], MD [facility] None  02/22/2020  2:10 PM Armbruster, Carlota Raspberry, MD [facility]  05/17/2020 10:40 AM Yong Channel Brayton Mars, MD [facility]    Lab/Order associations:   ICD-10-CM   1. Other acute pulmonary embolism without acute cor pulmonale (HCC)  I26.99   2. Essential hypertension  I10 CBC with Differential/Platelet    Comprehensive metabolic panel  3. Vestibular disequilibrium, unspecified laterality  H83.2X9   4. Ankylosing spondylitis of multiple sites in spine ([place])  M45.0   5. Gastroesophageal reflux disease, unspecified whether esophagitis present  K21.9   6. Stage 3 chronic kidney disease, unspecified whether stage 3a or 3b CKD  N18.30     Return precautions advised.  [name], MD

## 2020-01-18 NOTE — Patient Instructions (Addendum)
Check with ENT when you see them about options for vestibular rehab/physical therapy- im happy to help/place referral if needed  If you can get a copy of [facility] with [facility] at next visit- please bring to next visit with me  Please stop by lab before you go If you do not have mychart- we will call you about results within 5 business days of [name] receiving them.  If you have mychart- we will send your results within 3 business days of [name] receiving them.  If abnormal or we want to clarify a result, we will call or mychart you to make sure you receive the message.  If you have questions or concerns or don't hear within 5-7 days, please send [name] a message or call us.   Recommended follow up: Return in about 4 months (around 05/17/2020).

## 2020-01-19 DIAGNOSIS — N183 Chronic kidney disease, stage 3 unspecified: Secondary | ICD-10-CM | POA: Insufficient documentation

## 2020-01-19 NOTE — Assessment & Plan Note (Signed)
From last note "At the time of emergency room visit he was suffering from some dizziness.  He has had ongoing issues with dizziness/vertigo for years.  [facility] and no concrete findings were discovered.  Vestibular disequilibrium was listed by hospitalist approximately 4 years ago and remains on problem list.  He has also had ataxia previously listed.  He mentions-Has tinnitus and hearing loss in right ear.  He thinks that Mnire's was ruled out."  Issues have persisted. Please note in ER visit in November he had MR brain and MR angiogram which were largely reassuring other than small vessel disease.   Patient is scheduled for a visit with Dr. Lucia Gaskins of ENT for his opinion within the next 2 weeks.  We opted to defer decision on physical therapy/vestibular rehab for his opinion on a provider for this.  Patient has done some normal physical therapy in the past and not found helpful for gait/balance  If issues are not thought to be ENT in origin could consider neurology referral.

## 2020-01-19 NOTE — Assessment & Plan Note (Signed)
Patient followed by [facility] rheumatology-there have been conflicting opinions in the past about this diagnosis.  At present he remains on chronic prednisone 5 mg.  He was rather sedentary as a result of multiple musculoskeletal issues.  He has help me get back to the pool when able.  For now.  Stable-current medications will be continued -We do not have formal record of this but in the past it has sounded like patient has been on Reclast since he is on long-term prednisone

## 2020-01-19 NOTE — Assessment & Plan Note (Addendum)
Left lower extremity DVT and bilateral PE noted January 13 2020 and started on anticoagulation.  Patient is compliant with Eliquis at high dose and knows to convert to 5 mg twice daily after 1 week.  Patient is already set up with hematology for consult.  This could possibly be considered provoked due to sedentary activity but with chronic musculoskeletal issues unclear if he would ever be able to be nonsedentary though he did do better when was able to do pool exercises.  Patient would like to consider hypercoagulability work-up as well  I was honest with the husband and wife that time course of this is not completely clear.  Patient did have some mild shortness of breath at last visit here and he thought this was related to sedentary activity-appears to have improved after treatment with Eliquis.  Patient did not have unilateral swelling of the leg or calf to lead [name] to suspect DVT previously-unclear if that would have been present at the last visit-patient continues to not have calf pain or swelling at this time.  At this point, thankful patient sought care when he did and time course likely to remain unclear

## 2020-01-19 NOTE — Assessment & Plan Note (Signed)
Good control of blood pressure even with reduction of amlodipine to 5 mg. Bilateral edema appears improved and only noted as trace today. Continue amlodipine 5 m, coreg 25mg  BID, imdur 60 mg

## 2020-01-19 NOTE — Assessment & Plan Note (Signed)
Patient remains on high-dose omeprazole 40 mg twice a day-had EGD with esophageal stretching on the 12th.  Appears stable-continue current medications

## 2020-01-19 NOTE — Assessment & Plan Note (Signed)
GFR in high 40s or 50s typically and noted again today -we will add diagnosis of chronic kidney disease stage III-patient should avoid NSAIDs due to being on anticoagulant but also to protect kidney function-this will be communicated to patient by nursing staff.

## 2020-01-24 ENCOUNTER — Encounter (INDEPENDENT_AMBULATORY_CARE_PROVIDER_SITE_OTHER): Payer: Self-pay | Admitting: Otolaryngology

## 2020-01-24 ENCOUNTER — Other Ambulatory Visit: Payer: Self-pay

## 2020-01-24 ENCOUNTER — Ambulatory Visit (INDEPENDENT_AMBULATORY_CARE_PROVIDER_SITE_OTHER): Payer: Medicare Other | Admitting: Otolaryngology

## 2020-01-24 VITALS — Temp 97.3°F

## 2020-01-24 DIAGNOSIS — H903 Sensorineural hearing loss, bilateral: Secondary | ICD-10-CM | POA: Diagnosis not present

## 2020-01-24 DIAGNOSIS — H832X3 Labyrinthine dysfunction, bilateral: Secondary | ICD-10-CM

## 2020-01-24 DIAGNOSIS — H9313 Tinnitus, bilateral: Secondary | ICD-10-CM | POA: Diagnosis not present

## 2020-01-24 NOTE — Progress Notes (Signed)
HPI: Bruce Ball is a 74 y.o. male who presents is referred by his PCP for evaluation of hearing loss and dizziness.  He apparently has had a longstanding history of vestibular disequilibrium.  He is also had chronic problems with tinnitus in his ears as well as loss of hearing in the right ear.  He gets his hearing aids from the [facility] [place] system.  He has already undergone vestibular rehab with physical therapy.  He has recently been started on a blood thinner.  He has had a previous CT scan of his sinuses that showed clear paranasal sinuses.  He is also had MRI scan of his head with no acute changes. He has had longstanding hearing loss worse in the right ear which is service-connected. Audiogram was performed in the office today and this showed bilateral SNHL much worse on the right side.  Left ear hearing is estimated to be approximately 50 DB right ear hearing is greater than 80 DB.Marland Kitchen  Past Medical History:  Diagnosis Date  . Chronic back pain greater than 3 months duration   . Chronic leg pain   . Deafness in right ear   . Extrinsic asthma, unspecified 04/07/2009   PT DENIES  . GERD (gastroesophageal reflux disease)   . History of kidney stones   . HYPERTENSION 10/29/2007  . HYPOTENSION 08/13/2010  . LOW BACK PAIN 04/21/2008  . NEPHROLITHIASIS, HX OF 04/21/2008  . PUD, HX OF 04/21/2008  . Renal disorder    Past Surgical History:  Procedure Laterality Date  . bone cancer  1970   rt femur removed and steel rod placed  . CYSTOSCOPY WITH RETROGRADE PYELOGRAM, URETEROSCOPY AND STENT PLACEMENT Right 11/06/2017   Procedure: CYSTOSCOPY WITH  STENT PLACEMENT RIGHT;  Surgeon: Raynelle Bring, MD;  Location: [facility];  Service: Urology;  Laterality: Right;  . CYSTOSCOPY WITH RETROGRADE PYELOGRAM, URETEROSCOPY AND STENT PLACEMENT Left 06/09/2019   Procedure: CYSTOSCOPY WITH RETROGRADE PYELOGRAM, URETEROSCOPY AND STENT PLACEMENT X2;  Surgeon: [name], MD;  Location: [facility];  Service: Urology;   Laterality: Left;  . CYSTOSCOPY/RETROGRADE/URETEROSCOPY     1 year ago at [facility] in [place]  . CYSTOSCOPY/URETEROSCOPY/HOLMIUM LASER Right 11/24/2017   Procedure: CYSTOSCOPY/URETEROSCOPY/ RETROGRADE/STENT REMOVAL;  Surgeon: Raynelle Bring, MD;  Location: [facility];  Service: Urology;  Laterality: Right;  . prosatectomy     Social History   Socioeconomic History  . Marital status: Married    Spouse name: Not on file  . Number of children: Not on file  . Years of education: Not on file  . Highest education level: Not on file  Occupational History  . Occupation: Optometrist  Tobacco Use  . Smoking status: Former Smoker    Packs/day: 1.00    Years: 17.00    Pack years: 17.00    Start date: 1963    Quit date: 12/30/1978    Years since quitting: 41.0  . Smokeless tobacco: Never Used  Substance and Sexual Activity  . Alcohol use: No  . Drug use: No  . Sexual activity: Not on file  Other Topics Concern  . Not on file  Social History Narrative  . Not on file   Social Determinants of Health   Financial Resource Strain:   . Difficulty of Paying Living Expenses: Not on file  Food Insecurity:   . Worried About Charity fundraiser in the Last Year: Not on file  . Ran Out of Food in the Last Year: Not on file  Transportation Needs:   .  Lack of Transportation (Medical): Not on file  . Lack of Transportation (Non-Medical): Not on file  Physical Activity:   . Days of Exercise per Week: Not on file  . Minutes of Exercise per Session: Not on file  Stress:   . Feeling of Stress : Not on file  Social Connections:   . Frequency of Communication with Friends and Family: Not on file  . Frequency of Social Gatherings with Friends and Family: Not on file  . Attends Religious Services: Not on file  . Active Member of Clubs or Organizations: Not on file  . Attends Archivist Meetings: Not on file  . Marital Status: Not on file   Family History  Problem Relation Age of Onset  .  COPD Father   . Cancer Other        prostate  . Prostate cancer Brother   . Esophageal cancer Neg Hx   . Stomach cancer Neg Hx   . Pancreatic cancer Neg Hx   . Colon cancer Neg Hx    No Known Allergies Prior to Admission medications   Medication Sig Start Date End Date Taking? Authorizing Provider  amLODipine (NORVASC) 10 MG tablet Take 5 mg by mouth at bedtime.    Yes [provider]  apixaban (ELIQUIS) 5 MG TABS tablet Take 2 tablets (10mg ) twice daily for 7 days, then 1 tablet (5mg ) twice daily 01/16/20  Yes Georgette Shell, MD  atorvastatin (LIPITOR) 20 MG tablet Take 20 mg at bedtime by mouth.    Yes [provider]  carvedilol (COREG) 25 MG tablet Take 25 mg 2 (two) times daily by mouth.    Yes [provider]  Cholecalciferol (VITAMIN D3) 50 MCG (2000 UT) capsule Take 2,000 Units by mouth daily.   Yes [provider]  isosorbide mononitrate (IMDUR) 60 MG 24 hr tablet Take 60 mg by mouth at bedtime.    Yes [provider]  omeprazole (PRILOSEC) 40 MG capsule Take 40 mg by mouth 2 (two) times daily.   Yes [provider]  predniSONE (DELTASONE) 5 MG tablet Take 5 mg by mouth daily with breakfast.   Yes [provider]     Positive ROS: Otherwise negative  All other systems have been reviewed and were otherwise negative with the exception of those mentioned in the HPI and as above.  Physical Exam: Constitutional: Alert, well-appearing, no acute distress Ears: External ears without lesions or tenderness. Ear canals are clear bilaterally with intact, clear TMs.  No nystagmus on position changing or evidence of BPPV on exam in the office today. Nasal: External nose without lesions. Septum with minimal deformity.  Moderate rhinitis.. Clear nasal passages.  No signs of infection Oral: Lips and gums without lesions. Tongue and palate mucosa without lesions. Posterior oropharynx clear. Neck: No palpable adenopathy or  masses Respiratory: Breathing comfortably  Skin: No facial/neck lesions or rash noted.  Audiogram in the office today demonstrated hearing in the left ear ranging from 40 DB down to 60 DB with SRT of 50 DB.  Right ear hearing ranged from 70 DB down to 100 DB with no appreciable hearing on the right side.  Procedures  Assessment: Longstanding SNHL.  He is followed at the [facility] system for obtaining hearing aids as this is service-connected. Chronic imbalance secondary to vestibular disequilibrium.  Plan: The only treatment option for his imbalance is going to be physical therapy and vestibular rehab.  I reviewed this with him.  There is  no significant medical therapy that will improve this. He follows up with the [facility] system every 3 years for new hearing aids.   Radene Journey, MD   CC:

## 2020-01-27 ENCOUNTER — Encounter: Payer: Self-pay | Admitting: Hematology and Oncology

## 2020-01-27 ENCOUNTER — Inpatient Hospital Stay: Payer: Medicare Other | Attending: Hematology and Oncology | Admitting: Hematology and Oncology

## 2020-01-27 ENCOUNTER — Other Ambulatory Visit: Payer: Self-pay

## 2020-01-27 ENCOUNTER — Encounter (INDEPENDENT_AMBULATORY_CARE_PROVIDER_SITE_OTHER): Payer: Self-pay

## 2020-01-27 DIAGNOSIS — Z85830 Personal history of malignant neoplasm of bone: Secondary | ICD-10-CM

## 2020-01-27 DIAGNOSIS — I10 Essential (primary) hypertension: Secondary | ICD-10-CM | POA: Diagnosis not present

## 2020-01-27 DIAGNOSIS — E785 Hyperlipidemia, unspecified: Secondary | ICD-10-CM | POA: Diagnosis not present

## 2020-01-27 DIAGNOSIS — Z8546 Personal history of malignant neoplasm of prostate: Secondary | ICD-10-CM | POA: Diagnosis not present

## 2020-01-27 DIAGNOSIS — K219 Gastro-esophageal reflux disease without esophagitis: Secondary | ICD-10-CM

## 2020-01-27 DIAGNOSIS — Z7901 Long term (current) use of anticoagulants: Secondary | ICD-10-CM | POA: Diagnosis not present

## 2020-01-27 DIAGNOSIS — R296 Repeated falls: Secondary | ICD-10-CM

## 2020-01-27 DIAGNOSIS — R600 Localized edema: Secondary | ICD-10-CM | POA: Insufficient documentation

## 2020-01-27 DIAGNOSIS — M549 Dorsalgia, unspecified: Secondary | ICD-10-CM | POA: Insufficient documentation

## 2020-01-27 DIAGNOSIS — Z86711 Personal history of pulmonary embolism: Secondary | ICD-10-CM | POA: Insufficient documentation

## 2020-01-27 DIAGNOSIS — I2699 Other pulmonary embolism without acute cor pulmonale: Secondary | ICD-10-CM

## 2020-01-27 DIAGNOSIS — G609 Hereditary and idiopathic neuropathy, unspecified: Secondary | ICD-10-CM | POA: Insufficient documentation

## 2020-01-27 DIAGNOSIS — Z86718 Personal history of other venous thrombosis and embolism: Secondary | ICD-10-CM | POA: Diagnosis not present

## 2020-01-27 DIAGNOSIS — Z87891 Personal history of nicotine dependence: Secondary | ICD-10-CM

## 2020-01-28 ENCOUNTER — Encounter: Payer: Self-pay | Admitting: Hematology and Oncology

## 2020-01-28 ENCOUNTER — Telehealth: Payer: Self-pay | Admitting: Hematology and Oncology

## 2020-01-28 DIAGNOSIS — G609 Hereditary and idiopathic neuropathy, unspecified: Secondary | ICD-10-CM | POA: Insufficient documentation

## 2020-01-28 DIAGNOSIS — R296 Repeated falls: Secondary | ICD-10-CM | POA: Insufficient documentation

## 2020-01-28 NOTE — Assessment & Plan Note (Signed)
He is more than 50 years out from his surgery and is considered a long-term cancer survivor from this disease

## 2020-01-28 NOTE — Assessment & Plan Note (Signed)
He follows closely PSA monitoring and according to him, he remains in remission

## 2020-01-28 NOTE — Assessment & Plan Note (Signed)
He has history of vitamin B12 deficiency but have stopped taking B12 injection because he was told that test results at the [facility] was adequate He has not have B12 levels checked recently There are other causes of peripheral neuropathy I am concerned about neuropathy as a contributing factor to his fall I recommend further evaluation by neurologist and he agrees

## 2020-01-28 NOTE — Assessment & Plan Note (Addendum)
I reviewed with the patient about the plan for care for  provoked DVT/PE.  This last episode of blood clot appeared to be provoked, secondary to possible trauma from his recurrent falls, possible dehydration and poor mobility. We discussed about the pros and cons about testing for thrombophilia disorder. his current anticoagulation therapy will interfere with some the tests and it is not possible to interpret the test results.  Taking him off the anticoagulation therapy to do the tests may precipitate another thrombotic event. I do not see a reason to order excessive testing to screen for thrombophilia disorder as it would not change our management.  The goal of anticoagulation therapy is for for minimum 6 months or longer.  To address his daughter's concern, there is no benefit of checking for COVID-19 antibodies  We discussed about various options of anticoagulation therapies including warfarin, low molecular weight heparin such as Lovenox or newer agents such as Rivaroxaban. Some of the risks and benefits discussed including costs involved, the need for monitoring, risks of life-threatening bleeding/hospitalization, reversibility of each agent in the event of bleeding or overdose, safety profile of each drug and taking into account other social issues such as ease of administration of medications, etc. Ultimately, we have made an informed decision for the patient to continue his treatment with Eliquis  I will see him in 6 months for further follow-up

## 2020-01-28 NOTE — Progress Notes (Signed)
[place] NOTE  Patient Care Team: Bruce Olp, MD as PCP - General (Family Medicine) [facility], [facility] as Consulting Physician Bruce Gamma, MD as Consulting Physician (Neurosurgery) Clinic, Bruce Ball as Consulting Physician  ASSESSMENT & PLAN:   Pulmonary emboli [facility]) I reviewed with the patient about the plan for care for  provoked DVT/PE.  This last episode of blood clot appeared to be provoked, secondary to possible trauma from his recurrent falls, possible dehydration and poor mobility. We discussed about the pros and cons about testing for thrombophilia disorder. his current anticoagulation therapy will interfere with some the tests and it is not possible to interpret the test results.  Taking him off the anticoagulation therapy to do the tests may precipitate another thrombotic event. I do not see a reason to order excessive testing to screen for thrombophilia disorder as it would not change our management.  The goal of anticoagulation therapy is for for minimum 6 months or longer.  To address his daughter's concern, there is no benefit of checking for COVID-19 antibodies  We discussed about various options of anticoagulation therapies including warfarin, low molecular weight heparin such as Lovenox or newer agents such as Rivaroxaban. Some of the risks and benefits discussed including costs involved, the need for monitoring, risks of life-threatening bleeding/hospitalization, reversibility of each agent in the event of bleeding or overdose, safety profile of each drug and taking into account other social issues such as ease of administration of medications, etc. Ultimately, we have made an informed decision for the patient to continue his treatment with Eliquis  I will see him in 6 months for further follow-up  Idiopathic neuropathy He has history of vitamin B12 deficiency but have stopped taking B12 injection because he was told  that test results at the [facility] was adequate He has not have B12 levels checked recently There are other causes of peripheral neuropathy I am concerned about neuropathy as a contributing factor to his fall I recommend further evaluation by neurologist and he agrees  Recurrent falls He has history of recurrent falls with gait instability I am very concerned about this as he is on anticoagulation therapy right now There could be also contributing factors including peripheral neuropathy and possibly steroid induced myopathy I recommend neurology evaluation to exclude treatable causes and he agreed  History of prostate cancer He follows closely PSA monitoring and according to him, he remains in remission  Hx of chondrosarcoma He is more than 50 years out from his surgery and is considered a long-term cancer survivor from this disease  Orders Placed This Encounter  Procedures  . Ambulatory referral to Neurology    Referral Priority:   Routine    Referral Type:   Consultation    Referral Reason:   Specialty Services Required    Requested Specialty:   Neurology    Number of Visits Requested:   1    All questions were answered. The patient knows to call the clinic with any problems, questions or concerns. The total time spent in the appointment was 60 minutes encounter with patients including review of chart and various tests results, discussions about plan of care and coordination of care plan  Bruce Lark, MD 1/29/20218:38 AM  CHIEF COMPLAINTS/PURPOSE OF CONSULTATION:  Acute DVT and bilateral PE, for further management  HISTORY OF PRESENTING ILLNESS:  Bruce Ball 74 y.o. male is here because of recent diagnosis of DVT and PE  The patient had background history  of chondrosarcoma in his 48s status post surgery He also have history of prostate cancer status post prostatectomy, in remission without the need for adjuvant chemotherapy or radiation therapy He is a retired English as a second language teacher who has  been exposed to agent orange when he was younger He is here accompanied by his wife, Bruce Ball  He was recently hospitalized between January 14 to January 16, 2020 after presentation with shortness of breath He was found to have left lower extremity DVT and bilateral PE He complained of shortness of breath shortly after EGD dilatation was performed for chronic reflux disease and esophageal stricture  On January 13, 2020, CT angiogram showed Pulmonary emboli in all lobes of both lungs. Evidence of right heart strain (RV/LV Ratio = 1.06) consistent with at least submassive (intermediate risk) PE. The presence of right heart strain has been associated with an increased risk of morbidity and mortality. Coronary artery disease. Aortic Atherosclerosis (ICD10-I70.0).  On January 15, venous Doppler ultrasound showed Right: There is no evidence of deep vein thrombosis in the lower extremity. No cystic structure found in the popliteal fossa.  Left: Findings consistent with acute deep vein thrombosis involving the left peroneal veins. No cystic structure found in the popliteal fossa.   Echocardiogram report showed IMPRESSIONS   1. Left ventricular ejection fraction, by visual estimation, is 60 to 65%. The left ventricle has normal function. There is moderately increased left ventricular hypertrophy.  2. Moderate asymmetric hypertrophy of the basal septum measuring 64mm (posterior wall 35mm)  3. The mitral valve is normal in structure. No evidence of mitral valve regurgitation.  4. The aortic valve is tricuspid. Aortic valve regurgitation is trivial. Mild aortic valve sclerosis without stenosis.  5. The tricuspid valve is grossly normal.  6. The pulmonic valve was not well visualized. Pulmonic valve regurgitation is trivial.  7. Global right ventricle has normal systolic function.The right ventricular size is normal.  8. Left atrial size was normal.  9. Right atrial size was normal. 10. TR signal is  inadequate for assessing pulmonary artery systolic pressure  He was anticoagulated and was subsequently discharged He had history of recurrent falls, the last fall was over a month ago According to the patient, after he landed at the top of the stairs, his legs buckled with some associated dizziness but he did not sustain major injury A few months back, he also tripped and fell in his yard According to the patient, he has pre-existing peripheral neuropathy affecting both toes and the bottom of his feet of unknown etiology His wife stated he walks with a shuffle He had history of B12 deficiency requiring vitamin B12 supplementation but according to the [place] records, he was told that his B12 level has been good and he has stopped receiving B12 injections He has never been formally evaluated for the cause of his falls or causes of neuropathy by a neurologist On review of outside records, he was seen by a neurologist several years ago for headache but he did not recall that visit His mobility is poor He has chronic bilateral lower extremity edema for some time When he presented to the emergency room, he denies calf pain but he does have bilateral anterior shin pain that is tender to touch for some time He has chronic back pain requiring neurosurgery and have undergone extensive treatment including surgery, implantation of stimulator and removal, extensive physical therapy and rehab for chronic back pain  He denies recent history of trauma but did have falls, no long distance  travel, recent surgery, or smoking. He had prior surgeries before and never had perioperative thromboembolic events. The patient had not been exposed on testosterone replacement therapy  No family history of blood clots So far, he tolerated anticoagulation therapy well without bleeding complications.  MEDICAL HISTORY:  Past Medical History:  Diagnosis Date  . Chronic back pain greater than 3 months duration   . Chronic leg  pain   . Deafness in right ear   . Extrinsic asthma, unspecified 04/07/2009   PT DENIES  . GERD (gastroesophageal reflux disease)   . History of kidney stones   . HYPERTENSION 10/29/2007  . HYPOTENSION 08/13/2010  . LOW BACK PAIN 04/21/2008  . NEPHROLITHIASIS, HX OF 04/21/2008  . PUD, HX OF 04/21/2008  . Renal disorder     SURGICAL HISTORY: Past Surgical History:  Procedure Laterality Date  . bone cancer  1970   rt femur removed and steel rod placed  . CYSTOSCOPY WITH RETROGRADE PYELOGRAM, URETEROSCOPY AND STENT PLACEMENT Right 11/06/2017   Procedure: CYSTOSCOPY WITH  STENT PLACEMENT RIGHT;  Surgeon: Raynelle Bring, MD;  Location: [facility];  Service: Urology;  Laterality: Right;  . CYSTOSCOPY WITH RETROGRADE PYELOGRAM, URETEROSCOPY AND STENT PLACEMENT Left 06/09/2019   Procedure: CYSTOSCOPY WITH RETROGRADE PYELOGRAM, URETEROSCOPY AND STENT PLACEMENT X2;  Surgeon: [name], MD;  Location: [facility];  Service: Urology;  Laterality: Left;  . CYSTOSCOPY/RETROGRADE/URETEROSCOPY     1 year ago at [place] in [place]  . CYSTOSCOPY/URETEROSCOPY/HOLMIUM LASER Right 11/24/2017   Procedure: CYSTOSCOPY/URETEROSCOPY/ RETROGRADE/STENT REMOVAL;  Surgeon: Raynelle Bring, MD;  Location: [facility];  Service: Urology;  Laterality: Right;  . prosatectomy      SOCIAL HISTORY: Social History   Socioeconomic History  . Marital status: Married    Spouse name: Not on file  . Number of children: 2  . Years of education: Not on file  . Highest education level: Not on file  Occupational History  . Occupation: Optometrist  Tobacco Use  . Smoking status: Former Smoker    Packs/day: 1.00    Years: 17.00    Pack years: 17.00    Start date: 1963    Quit date: 12/30/1978    Years since quitting: 41.1  . Smokeless tobacco: Never Used  Substance and Sexual Activity  . Alcohol use: No  . Drug use: No  . Sexual activity: Not on file  Other Topics Concern  . Not on file  Social History Narrative  . Not on file    Social Determinants of Health   Financial Resource Strain:   . Difficulty of Paying Living Expenses: Not on file  Food Insecurity:   . Worried About Charity fundraiser in the Last Year: Not on file  . Ran Out of Food in the Last Year: Not on file  Transportation Needs:   . Lack of Transportation (Medical): Not on file  . Lack of Transportation (Non-Medical): Not on file  Physical Activity:   . Days of Exercise per Week: Not on file  . Minutes of Exercise per Session: Not on file  Stress:   . Feeling of Stress : Not on file  Social Connections:   . Frequency of Communication with Friends and Family: Not on file  . Frequency of Social Gatherings with Friends and Family: Not on file  . Attends Religious Services: Not on file  . Active Member of Clubs or Organizations: Not on file  . Attends Archivist Meetings: Not on file  . Marital Status: Not  on file  Intimate Partner Violence:   . Fear of Current or Ex-Partner: Not on file  . Emotionally Abused: Not on file  . Physically Abused: Not on file  . Sexually Abused: Not on file    FAMILY HISTORY: Family History  Problem Relation Age of Onset  . COPD Father   . Cancer Other        prostate  . Prostate cancer Brother   . Esophageal cancer Neg Hx   . Stomach cancer Neg Hx   . Pancreatic cancer Neg Hx   . Colon cancer Neg Hx     ALLERGIES:  has No Known Allergies.  MEDICATIONS:  Current Outpatient Medications  Medication Sig Dispense Refill  . amLODipine (NORVASC) 10 MG tablet Take 5 mg by mouth at bedtime.     Marland Kitchen apixaban (ELIQUIS) 5 MG TABS tablet Take 2 tablets (10mg ) twice daily for 7 days, then 1 tablet (5mg ) twice daily 60 tablet 1  . atorvastatin (LIPITOR) 20 MG tablet Take 20 mg at bedtime by mouth.     . carvedilol (COREG) 25 MG tablet Take 25 mg 2 (two) times daily by mouth.     . Cholecalciferol (VITAMIN D3) 50 MCG (2000 UT) capsule Take 2,000 Units by mouth daily.    . isosorbide mononitrate  (IMDUR) 60 MG 24 hr tablet Take 60 mg by mouth at bedtime.     Marland Kitchen omeprazole (PRILOSEC) 40 MG capsule Take 40 mg by mouth 2 (two) times daily.    . predniSONE (DELTASONE) 5 MG tablet Take 5 mg by mouth daily with breakfast.     No current facility-administered medications for this visit.    REVIEW OF SYSTEMS:   Constitutional: Denies fevers, chills or abnormal night sweats Eyes: Denies blurriness of vision, double vision or watery eyes Ears, nose, mouth, throat, and face: Denies mucositis or sore throat Cardiovascular: Denies palpitation, chest discomfort  Skin: Denies abnormal skin rashes Lymphatics: Denies new lymphadenopathy or easy bruising Behavioral/Psych: Mood is stable, no new changes  All other systems were reviewed with the patient and are negative.  PHYSICAL EXAMINATION: ECOG PERFORMANCE STATUS: 2 - Symptomatic, <50% confined to bed  Vitals:   01/27/20 1235  BP: (!) 149/75  Pulse: (!) 53  Resp: 18  Temp: 97.9 F (36.6 C)  SpO2: 95%   Filed Weights   01/27/20 1235  Weight: 238 lb (108 kg)    GENERAL:alert, no distress and comfortable.  He is mildly overweight.  He walks with a walker for stability SKIN: skin color, texture, turgor are normal, no rashes or significant lesions EYES: normal, conjunctiva are pink and non-injected, sclera clear OROPHARYNX:no exudate, no erythema and lips, buccal mucosa, and tongue normal  NECK: supple, thyroid normal size, non-tender, without nodularity LYMPH:  no palpable lymphadenopathy in the cervical, axillary or inguinal LUNGS: clear to auscultation and percussion with normal breathing effort HEART: regular rate & rhythm and no murmurs with mild bilateral lower extremity edema ABDOMEN:abdomen soft, non-tender and normal bowel sounds Musculoskeletal:no cyanosis of digits and no clubbing  PSYCH: alert & oriented x 3 with fluent speech NEURO: no focal motor/sensory deficits  LABORATORY DATA:  I have reviewed the data as  listed Lab Results  Component Value Date   WBC 9.1 01/18/2020   HGB 14.8 01/18/2020   HCT 44.5 01/18/2020   MCV 88.9 01/18/2020   PLT 179.0 01/18/2020    RADIOGRAPHIC STUDIES: I have also reviewed some scanned blood tests report from mychart I have personally reviewed  the radiological images as listed and agreed with the findings in the report. DG Chest 2 View  Result Date: 01/13/2020 CLINICAL DATA:  Shortness of breath EXAM: CHEST - 2 VIEW COMPARISON:  Radiograph 12/15/2018 FINDINGS: Stable calcified granuloma in the left mid lung. Chronically coarsened interstitial changes most pronounced in the left base are similar to prior. No consolidation, features of edema, pneumothorax, or effusion. The cardiomediastinal contours are unremarkable. No acute osseous or soft tissue abnormality. Degenerative changes are present in the imaged spine and shoulders. IMPRESSION: No acute cardiopulmonary abnormality. Electronically Signed   By: Lovena Le M.D.   On: 01/13/2020 16:55   CT Angio Chest PE W/Cm &/Or Wo Cm  Result Date: 01/13/2020 CLINICAL DATA:  Elevated D-dimer. EXAM: CT ANGIOGRAPHY CHEST WITH CONTRAST TECHNIQUE: Multidetector CT imaging of the chest was performed using the standard protocol during bolus administration of intravenous contrast. Multiplanar CT image reconstructions and MIPs were obtained to evaluate the vascular anatomy. CONTRAST:  33mL OMNIPAQUE IOHEXOL 350 MG/ML SOLN COMPARISON:  06/01/2014 FINDINGS: Cardiovascular: There are bilateral pulmonary emboli in all lobes of both lungs. RV/LV ratio slightly elevated at 1.06. Heart is normal size. Aorta is normal caliber. Scattered coronary artery and aortic calcifications. Mediastinum/Nodes: No mediastinal, hilar, or axillary adenopathy. Calcified mediastinal and left hilar lymph nodes present. Lungs/Pleura: Calcified granuloma in the left upper lobe. No confluent opacities. Upper Abdomen: Imaging into the upper abdomen shows no acute  findings. Musculoskeletal: Chest wall soft tissues are unremarkable. No acute bony abnormality. Review of the MIP images confirms the above findings. IMPRESSION: Pulmonary emboli in all lobes of both lungs. Evidence of right heart strain (RV/LV Ratio = 1.06) consistent with at least submassive (intermediate risk) PE. The presence of right heart strain has been associated with an increased risk of morbidity and mortality. Please activate Code PE by paging [phone]. Coronary artery disease Aortic Atherosclerosis (ICD10-I70.0). Critical Value/emergent results were called by telephone at the time of interpretation on 01/13/2020 at 11:28 pm to providerMARCY PFEIFFER , who verbally acknowledged these results. Electronically Signed   By: Rolm Baptise M.D.   On: 01/13/2020 23:30   ECHOCARDIOGRAM COMPLETE  Result Date: 01/14/2020   ECHOCARDIOGRAM REPORT   Patient Name:   Bruce Ball Date of Exam: 01/14/2020 Medical Rec #:  [id number]       Height:       71.0 in Accession #:    [id number]      Weight:       242.0 lb Date of Birth:  10-28-1946       BSA:          2.29 m Patient Age:    35 years        BP:           127/74 mmHg Patient Gender: M               HR:           74 bpm. Exam Location:  Inpatient Procedure: 2D Echo, Cardiac Doppler and Color Doppler Indications:    Pulmonary Embolus 415.19  History:        Patient has no prior history of Echocardiogram examinations.                 Risk Factors:Hypertension and Dyslipidemia. History of prostate                 cancer,GERD.  Sonographer:    Alvino Chapel RCS Referring Phys: [place]  1. Left ventricular ejection fraction, by visual estimation, is 60 to 65%. The left ventricle has normal function. There is moderately increased left ventricular hypertrophy.  2. Moderate asymmetric hypertrophy of the basal septum measuring 33mm (posterior wall 62mm)  3. The mitral valve is normal in structure. No evidence of mitral valve regurgitation.  4.  The aortic valve is tricuspid. Aortic valve regurgitation is trivial. Mild aortic valve sclerosis without stenosis.  5. The tricuspid valve is grossly normal.  6. The pulmonic valve was not well visualized. Pulmonic valve regurgitation is trivial.  7. Global right ventricle has normal systolic function.The right ventricular size is normal.  8. Left atrial size was normal.  9. Right atrial size was normal. 10. TR signal is inadequate for assessing pulmonary artery systolic pressure. FINDINGS  Left Ventricle: Left ventricular ejection fraction, by visual estimation, is 60 to 65%. The left ventricle has normal function. The left ventricle has no regional wall motion abnormalities. There is moderately increased left ventricular hypertrophy. Asymmetric left ventricular hypertrophy. Left ventricular diastolic parameters were normal. Right Ventricle: The right ventricular size is normal. No increase in right ventricular wall thickness. Global RV systolic function is has normal systolic function. Left Atrium: Left atrial size was normal in size. Right Atrium: Right atrial size was normal in size Pericardium: There is no evidence of pericardial effusion. Mitral Valve: The mitral valve is normal in structure. No evidence of mitral valve regurgitation. Tricuspid Valve: The tricuspid valve is grossly normal. Tricuspid valve regurgitation is not demonstrated. Aortic Valve: The aortic valve is tricuspid. Aortic valve regurgitation is trivial. Mild aortic valve sclerosis is present, with no evidence of aortic valve stenosis. Pulmonic Valve: The pulmonic valve was not well visualized. Pulmonic valve regurgitation is trivial. Pulmonic regurgitation is trivial. Aorta: The aortic root is normal in size and structure. IAS/Shunts: The interatrial septum was not well visualized.  LEFT VENTRICLE PLAX 2D LVIDd:         4.72 cm       Diastology LVIDs:         3.06 cm       LV e' lateral:   5.55 cm/s LV PW:         1.17 cm       LV E/e'  lateral: 12.2 LV IVS:        1.11 cm       LV e' medial:    5.22 cm/s LVOT diam:     2.10 cm       LV E/e' medial:  13.0 LV SV:         67 ml LV SV Index:   28.00 LVOT Area:     3.46 cm  LV Volumes (MOD) LV area d, A2C:    27.50 cm LV area d, A4C:    28.60 cm LV area s, A2C:    15.50 cm LV area s, A4C:    14.30 cm LV major d, A2C:   8.63 cm LV major d, A4C:   8.59 cm LV major s, A2C:   6.89 cm LV major s, A4C:   6.73 cm LV vol d, MOD A2C: 73.6 ml LV vol d, MOD A4C: 78.4 ml LV vol s, MOD A2C: 30.2 ml LV vol s, MOD A4C: 25.8 ml LV SV MOD A2C:     43.4 ml LV SV MOD A4C:     78.4 ml LV SV MOD BP:      47.5 ml RIGHT VENTRICLE RV S prime:  13.40 cm/s TAPSE (M-mode): 1.7 cm LEFT ATRIUM             Index       RIGHT ATRIUM           Index LA diam:        4.00 cm 1.75 cm/m  RA Area:     14.90 cm LA Vol (A2C):   45.6 ml 19.94 ml/m RA Volume:   36.40 ml  15.92 ml/m LA Vol (A4C):   44.3 ml 19.37 ml/m LA Biplane Vol: 46.1 ml 20.16 ml/m  AORTIC VALVE LVOT Vmax:   120.00 cm/s LVOT Vmean:  77.200 cm/s LVOT VTI:    0.244 m  AORTA Ao Root diam: 3.50 cm MITRAL VALVE MV Area (PHT): 2.37 cm             SHUNTS MV PHT:        92.80 msec           Systemic VTI:  0.24 m MV Decel Time: 320 msec             Systemic Diam: 2.10 cm MV E velocity: 67.90 cm/s 103 cm/s MV A velocity: 99.80 cm/s 70.3 cm/s MV E/A ratio:  0.68       1.5  Oswaldo Milian MD Electronically signed by Oswaldo Milian MD Signature Date/Time: 01/14/2020/7:20:50 PM    Final    VAS [name] LOWER EXTREMITY VENOUS (DVT)  Result Date: 01/15/2020  Lower Venous Study Indications: Pulmonary embolism.  Comparison Study: No prior study. Performing Technologist: Maudry Mayhew MHA, RDMS, RVT, RDCS  Examination Guidelines: A complete evaluation includes B-mode imaging, spectral Doppler, color Doppler, and power Doppler as needed of all accessible portions of each vessel. Bilateral testing is considered an integral part of a complete examination. Limited  examinations for reoccurring indications may be performed as noted.  +---------+---------------+---------+-----------+----------+--------------+ RIGHT    CompressibilityPhasicitySpontaneityPropertiesThrombus Aging +---------+---------------+---------+-----------+----------+--------------+ CFV      Full           Yes      Yes                                 +---------+---------------+---------+-----------+----------+--------------+ SFJ      Full                                                        +---------+---------------+---------+-----------+----------+--------------+ FV Prox  Full                                                        +---------+---------------+---------+-----------+----------+--------------+ FV Mid   Full                                                        +---------+---------------+---------+-----------+----------+--------------+ FV DistalFull                                                        +---------+---------------+---------+-----------+----------+--------------+  PFV      Full                                                        +---------+---------------+---------+-----------+----------+--------------+ POP      Full           Yes      Yes                                 +---------+---------------+---------+-----------+----------+--------------+ PTV      Full                                                        +---------+---------------+---------+-----------+----------+--------------+ PERO     Full                                                        +---------+---------------+---------+-----------+----------+--------------+   +---------+---------------+---------+-----------+----------+--------------+ LEFT     CompressibilityPhasicitySpontaneityPropertiesThrombus Aging +---------+---------------+---------+-----------+----------+--------------+ CFV      Full           Yes      Yes                                  +---------+---------------+---------+-----------+----------+--------------+ SFJ      Full                                                        +---------+---------------+---------+-----------+----------+--------------+ FV Prox  Full                                                        +---------+---------------+---------+-----------+----------+--------------+ FV Mid   Full                                                        +---------+---------------+---------+-----------+----------+--------------+ FV DistalFull                                                        +---------+---------------+---------+-----------+----------+--------------+ PFV      Full                                                        +---------+---------------+---------+-----------+----------+--------------+   POP      Full           Yes      Yes                                 +---------+---------------+---------+-----------+----------+--------------+ PTV      Full                                                        +---------+---------------+---------+-----------+----------+--------------+ PERO     None                    No                   Acute          +---------+---------------+---------+-----------+----------+--------------+     Summary: Right: There is no evidence of deep vein thrombosis in the lower extremity. No cystic structure found in the popliteal fossa. Left: Findings consistent with acute deep vein thrombosis involving the left peroneal veins. No cystic structure found in the popliteal fossa.  *See table(s) above for measurements and observations. Electronically signed by Monica Martinez MD on 01/15/2020 at 1:47:25 PM.    Final

## 2020-01-28 NOTE — Assessment & Plan Note (Signed)
He has history of recurrent falls with gait instability I am very concerned about this as he is on anticoagulation therapy right now There could be also contributing factors including peripheral neuropathy and possibly steroid induced myopathy I recommend neurology evaluation to exclude treatable causes and he agreed

## 2020-01-28 NOTE — Telephone Encounter (Signed)
I talk with patient regarding schedule  

## 2020-02-07 ENCOUNTER — Encounter: Payer: Self-pay | Admitting: Neurology

## 2020-02-07 ENCOUNTER — Ambulatory Visit (INDEPENDENT_AMBULATORY_CARE_PROVIDER_SITE_OTHER): Payer: Medicare Other | Admitting: Neurology

## 2020-02-07 ENCOUNTER — Other Ambulatory Visit: Payer: Self-pay

## 2020-02-07 VITALS — BP 131/77 | HR 68 | Temp 97.8°F | Ht 71.0 in | Wt 239.0 lb

## 2020-02-07 DIAGNOSIS — R260 Ataxic gait: Secondary | ICD-10-CM | POA: Diagnosis not present

## 2020-02-07 DIAGNOSIS — G629 Polyneuropathy, unspecified: Secondary | ICD-10-CM | POA: Diagnosis not present

## 2020-02-07 NOTE — Patient Instructions (Signed)
I had a long discussion with the patient and his wife regarding his longstanding symptoms of paresthesias in his legs and gait and imbalance likely representing peripheral sensory neuropathy etiology to be determined.  I recommend we check neuropathy panel labs, hemoglobin A1c and  b12 and methylmalonic acid.  He was advised to use his walker at all times and we talked about fall safety prevention precautions.  Check EMG nerve conduction study.  He will return for follow-up in 2 months or call earlier if necessary.  Fall Prevention in Hospitals, Adult Being a patient in the hospital puts you at risk for falling. Falls can cause serious injury and harm, but they can be prevented. It is important to understand what puts you at risk for falling and what you and your health care team can do to prevent you from falling. If you or a loved one falls at the hospital, it is important to tell hospital staff about it. What increases the risk for falls? Certain conditions and treatments may increase your risk of falling in the hospital. These include:  Being in an unfamiliar environment, especially when using the bathroom at night.  Having surgery.  Being on bed rest.  Taking many medicines or certain types of medicines, such as sleeping pills.  Having tubes in place, such as IV lines or catheters. Other risk factors for falls in a hospital include:  Having difficulty with hearing or vision.  Having a change in thinking or behavior, such as confusion.  Having depression.  Having trouble with balance.  Being a male.  Feeling dizzy.  Needing to use the toilet frequently.  Having fallen during the past three months.  Having low blood pressure. What are some strategies for preventing falls? If you or a loved one has to stay in the hospital:  Ask about which fall prevention strategies will be in place. Do not hesitate to speak up if you notice that the fall prevention plan has changed.  Ask  for help moving around, especially after surgery or when feeling unwell.  If you have been asked to call for help when getting up, do not get up by yourself. Asking for help with getting up is for your safety, and the staff is there to help you.  Wear nonskid footwear.  Get up slowly, and sit at the side of the bed for a few minutes before standing up.  Keep items you need, such as the nurse call button or a phone, close to you so that you do not need to reach for them.  Wear eyeglasses or hearing aids if you have them.  Have someone stay in the hospital with you or your loved one.  Ask if sleeping pills or other medicines that can cause confusion are necessary. What does the hospital staff do to help prevent falls? Hospitals have systems in place to prevent falls and accidents, which may involve:  Discussing your fall risks and making a personalized fall prevention plan.  Checking in regularly to see if you need help.  Placing an arm band on your wrist or a sign near your room to alert other staff of your needs.  Using an alarm on your hospital bed. This is an alarm that goes off if you get out of bed and forget to call for help.  Keeping the bed in a low and locked position.  Keeping the area around the bed and bathroom well-lit and free from clutter.  Keeping your room quiet, so that  you can sleep and be well-rested.  Using safety equipment, such as: ? A belt around your waist. ? Walkers, crutches, and other devices for support. ? Safety beds, such as low beds or cushions on the floor next to the bed.  Having a staff person stay with you (one-on-one observation), even when you are using the bathroom. This is for your safety.  Using video monitoring. This allows a staff member to come to help you if you need help. What other actions can I take to lower my risk of falls?  Check in regularly with your health care provider or pharmacist to review all of the medicines that you  take.  Make sure that you have a regular exercise program to stay fit. This will help you maintain your balance.  Talk with a physical therapist or trainer if recommended by your health care provider. They can help you to improve your strength, balance, and endurance.  If you are over age 72: ? Ask your health care provider if you need a calcium or vitamin D supplement. ? Have your eyes and hearing checked every year. ? Have your feet checked every year. Where to find more information You can find more information about fall prevention from the Centers for Disease Control and Prevention: ImproveLook.cz Summary  Being in an unfamiliar environment, such as the hospital, increases your risk for falling.  If you have been asked to call for help when getting up, do not get up by yourself. Asking for help with getting up is for your safety, and the staff is there to help you.  Ask about which fall prevention strategies will be in place. Do not hesitate to speak up if you notice that the fall prevention plan has changed.  If you or a loved one falls, tell the hospital staff. This is important. This information is not intended to replace advice given to you by your health care provider. Make sure you discuss any questions you have with your health care provider. Document Revised: 11/28/2017 Document Reviewed: 07/30/2017 Elsevier Patient Education  [place].  Peripheral Neuropathy Peripheral neuropathy is a type of nerve damage. It affects nerves that carry signals between the spinal cord and the arms, legs, and the rest of the body (peripheral nerves). It does not affect nerves in the spinal cord or brain. In peripheral neuropathy, one nerve or a group of nerves may be damaged. Peripheral neuropathy is a broad category that includes many specific nerve disorders, like diabetic neuropathy, hereditary neuropathy, and carpal tunnel syndrome. What are the causes? This condition may be  caused by:  Diabetes. This is the most common cause of peripheral neuropathy.  Nerve injury.  Pressure or stress on a nerve that lasts a long time.  Lack (deficiency) of B vitamins. This can result from alcoholism, poor diet, or a restricted diet.  Infections.  Autoimmune diseases, such as rheumatoid arthritis and systemic lupus erythematosus.  Nerve diseases that are passed from parent to child (inherited).  Some medicines, such as cancer medicines (chemotherapy).  Poisonous (toxic) substances, such as lead and mercury.  Too little blood flowing to the legs.  Kidney disease.  Thyroid disease. In some cases, the cause of this condition is not known. What are the signs or symptoms? Symptoms of this condition depend on which of your nerves is damaged. Common symptoms include:  Loss of feeling (numbness) in the feet, hands, or both.  Tingling in the feet, hands, or both.  Burning pain.  Very sensitive skin.  Weakness.  Not being able to move a part of the body (paralysis).  Muscle twitching.  Clumsiness or poor coordination.  Loss of balance.  Not being able to control your bladder.  Feeling dizzy.  Sexual problems. How is this diagnosed? Diagnosing and finding the cause of peripheral neuropathy can be difficult. Your health care provider will take your medical history and do a physical exam. A neurological exam will also be done. This involves checking things that are affected by your brain, spinal cord, and nerves (nervous system). For example, your health care provider will check your reflexes, how you move, and what you can feel. You may have other tests, such as:  Blood tests.  Electromyogram (EMG) and nerve conduction tests. These tests check nerve function and how well the nerves are controlling the muscles.  Imaging tests, such as CT scans or MRI to rule out other causes of your symptoms.  Removing a small piece of nerve to be examined in a lab  (nerve biopsy). This is rare.  Removing and examining a small amount of the fluid that surrounds the brain and spinal cord (lumbar puncture). This is rare. How is this treated? Treatment for this condition may involve:  Treating the underlying cause of the neuropathy, such as diabetes, kidney disease, or vitamin deficiencies.  Stopping medicines that can cause neuropathy, such as chemotherapy.  Medicine to relieve pain. Medicines may include: ? Prescription or over-the-counter pain medicine. ? Antiseizure medicine. ? Antidepressants. ? Pain-relieving patches that are applied to painful areas of skin.  Surgery to relieve pressure on a nerve or to destroy a nerve that is causing pain.  Physical therapy to help improve movement and balance.  Devices to help you move around (assistive devices). Follow these instructions at home: Medicines  Take over-the-counter and prescription medicines only as told by your health care provider. Do not take any other medicines without first asking your health care provider.  Do not drive or use heavy machinery while taking prescription pain medicine. Lifestyle   Do not use any products that contain nicotine or tobacco, such as cigarettes and e-cigarettes. Smoking keeps blood from reaching damaged nerves. If you need help quitting, ask your health care provider.  Avoid or limit alcohol. Too much alcohol can cause a vitamin B deficiency, and vitamin B is needed for healthy nerves.  Eat a healthy diet. This includes: ? Eating foods that are high in fiber, such as fresh fruits and vegetables, whole grains, and beans. ? Limiting foods that are high in fat and processed sugars, such as fried or sweet foods. General instructions   If you have diabetes, work closely with your health care provider to keep your blood sugar under control.  If you have numbness in your feet: ? Check every day for signs of injury or infection. Watch for redness, warmth,  and swelling. ? Wear padded socks and comfortable shoes. These help protect your feet.  Develop a good support system. Living with peripheral neuropathy can be stressful. Consider talking with a mental health specialist or joining a support group.  Use assistive devices and attend physical therapy as told by your health care provider. This may include using a walker or a cane.  Keep all follow-up visits as told by your health care provider. This is important. Contact a health care provider if:  You have new signs or symptoms of peripheral neuropathy.  You are struggling emotionally from dealing with peripheral neuropathy.  Your pain  is not well-controlled. Get help right away if:  You have an injury or infection that is not healing normally.  You develop new weakness in an arm or leg.  You fall frequently. Summary  Peripheral neuropathy is when the nerves in the arms, or legs are damaged, resulting in numbness, weakness, or pain.  There are many causes of peripheral neuropathy, including diabetes, pinched nerves, vitamin deficiencies, autoimmune disease, and hereditary conditions.  Diagnosing and finding the cause of peripheral neuropathy can be difficult. Your health care provider will take your medical history, do a physical exam, and do tests, including blood tests and nerve function tests.  Treatment involves treating the underlying cause of the neuropathy and taking medicines to help control pain. Physical therapy and assistive devices may also help. This information is not intended to replace advice given to you by your health care provider. Make sure you discuss any questions you have with your health care provider. Document Revised: 11/28/2017 Document Reviewed: 02/24/2017 Elsevier Patient Education  2020 [employer].

## 2020-02-07 NOTE — Progress Notes (Signed)
[facility] [address] [place]. [place] [address] [phone]       OFFICE CONSULT NOTE  [name] Date of Birth:  November 13, 1946 Medical Record Number:  [id number]   Referring MD:  [name]  Reason for Referral: Neuropathy  HPI: [name] is a pleasant 74 year old Caucasian male seen today for initial office consultation visit for neuropathy and gait imbalance.  History is obtained from the patient and his wife is accompanying him today as well as review of electronic medical records and have reviewed available imaging films in PACS.  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  He has noticed the worsening of his paresthesias in his feet over the last couple of years as well as increasing balance difficulties.  He has had frequent falls in the last few months and started using a walker now which gives him more steadiness.  He did denies significant pain or discomfort and is able to sleep in the night.  He feels his discomfort paresthesias are more when he is on his feet and provide some relief when he rests and sits down.  He states he did have EMG nerve conduction of done 7 to 8 years ago but is unable to give me the results.  He was seen by neurologist once in the remote past with neuropathy was first diagnosed but did not go for follow-up since his follow-up was in the [place] [facility] system.  He has not had any recent vitamin B12 levels neuropathy panel labs checked.  He had an episode of sudden onset of dizziness and right ear hearing loss in 2017 for which she  was seen in the emergency room and MRI scan at that time was unremarkable he was diagnosed with peripheral vestibular neuronitis but states his hearing did not significantly come back in the right ear and his balance was also affected since then.  He had a MRI scan of the brain done on 11/16/2019 for one of his falls and dizziness which showed no acute abnormality with moderate changes of chronic small vessel disease. ROS:   14 system review of systems is positive for numbness, tingling, gait imbalance, decreased hearing and all other systems negative PMH:  Past Medical History:  Diagnosis Date   Chronic back pain greater than 3 months duration    Chronic leg pain    Deafness in right ear    Extrinsic asthma, unspecified 04/07/2009   PT DENIES   GERD (gastroesophageal reflux disease)    History of kidney stones    HYPERTENSION 10/29/2007   HYPOTENSION 08/13/2010   LOW BACK PAIN 04/21/2008   NEPHROLITHIASIS, HX OF 04/21/2008   PUD, HX OF 04/21/2008   Renal disorder     Social History:  Social History   Socioeconomic History   Marital status: Married    Spouse name: Not on file   Number of children: 2   Years of education: Not on file   Highest education level: Not on file  Occupational History   Occupation: accountant  Tobacco Use   Smoking status: Former  Smoker    Packs/day: 1.00    Years: 17.00    Pack years: 17.00    Start date: 63    Quit date: 12/30/1978    Years since quitting: 41.1   Smokeless tobacco: Never Used  Substance and Sexual Activity   Alcohol use: No   Drug use: No   Sexual activity: Not on file  Other Topics Concern   Not on file  Social History Narrative   Not on file   Social Determinants of Health   Financial Resource Strain:    Difficulty of Paying Living Expenses: Not on file  Food Insecurity:    Worried About [place] in the Last Year: Not on file   [employer] of Food in the Last Year: Not on file   Transportation Needs:    Lack of Transportation (Medical): Not on file   Lack of Transportation (Non-Medical): Not on file  Physical Activity:    Days of Exercise per Week: Not on file   Minutes of Exercise per Session: Not on file  Stress:    Feeling of Stress : Not on file  Social Connections:    Frequency of Communication with Friends and Family: Not on file   Frequency of Social Gatherings with Friends and Family: Not on file   Attends Religious Services: Not on file   Active Member of Clubs or Organizations: Not on file   Attends Archivist Meetings: Not on file   Marital Status: Not on file  Intimate Partner Violence:    Fear of Current or Ex-Partner: Not on file   Emotionally Abused: Not on file   Physically Abused: Not on file   Sexually Abused: Not on file    Medications:   Current Outpatient Medications on File Prior to Visit  Medication Sig Dispense Refill   amLODipine (NORVASC) 10 MG tablet Take 5 mg by mouth at bedtime.      apixaban (ELIQUIS) 5 MG TABS tablet Take 2 tablets (10mg ) twice daily for 7 days, then 1 tablet (5mg ) twice daily 60 tablet 1   atorvastatin (LIPITOR) 20 MG tablet Take 20 mg at bedtime by mouth.      carvedilol (COREG) 25 MG tablet Take 25 mg 2 (two) times daily by mouth.      Cholecalciferol (VITAMIN D3) 50 MCG (2000 UT) capsule Take 2,000 Units by mouth daily.     isosorbide mononitrate (IMDUR) 60 MG 24 hr tablet Take 60 mg by mouth at bedtime.      omeprazole (PRILOSEC) 40 MG capsule Take 40 mg by mouth 2 (two) times daily.     predniSONE (DELTASONE) 5 MG tablet Take 5 mg by mouth daily with breakfast.     No current facility-administered medications on file prior to visit.    Allergies:  No Known Allergies  Physical Exam General: Obese elderly Caucasian male, seated, in no evident distress Head: head normocephalic and atraumatic.   Neck: supple with no carotid or supraclavicular bruits Cardiovascular:  regular rate and rhythm, no murmurs Musculoskeletal: no deformity Skin:  no rash/petichiae.  1+ pedal edema bilaterally left greater than right Vascular:  Normal pulses all extremities  Neurologic Exam Mental Status: Awake and fully alert. Oriented to place and time. Recent and remote memory intact. Attention span, concentration and fund of knowledge appropriate. Mood and affect appropriate.  Cranial Nerves: Fundoscopic exam reveals sharp disc margins. Pupils equal, briskly reactive to light. Extraocular movements full without nystagmus. Visual fields full to confrontation.  Hearing diminished significantly in the right ear.. Facial sensation intact. Face, tongue, palate moves normally and symmetrically.  Motor: Normal bulk and tone. Normal strength in all tested extremity muscles. Sensory.:  Hyperesthesia to touch , pinprick in both feet from ankle down left greater than right and significantly impaired, position and vibratory sensation.  In both feet from ankle down. Coordination: Rapid alternating movements normal in all extremities. Finger-to-nose and heel-to-shin performed accurately bilaterally. Gait and Station: Arises from chair with mild difficulty. Stance is broad-based l. Gait demonstrates mild ataxia with broad-based .  Unsteady while standing on either foot unsupported.  Romberg sign is weakly positive.  Unable to heel, toe and tandem walk without difficulty.  Reflexes: 1+ and symmetric. Toes downgoing.       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  Remote history of vitamin B12 deficiency which had been corrected but I do not find any recent lab work for the same.  Recent history of deep vein thrombosis and pulmonary embolism on long-term anticoagulation on Eliquis now.  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     PLAN: I had a long discussion with the patient and his wife regarding his longstanding symptoms of paresthesias in his legs and gait and imbalance likely representing peripheral sensory neuropathy etiology to be determined.  I recommend we check neuropathy panel labs, hemoglobin A1c and  b12 and methylmalonic acid.  He was advised to use his walker at all times and we talked about fall safety prevention precautions.  Check EMG nerve conduction study.  Greater than 50% time during this 45-minute consultation visit was spent on counseling and coordination of care about his neuropathy and gait imbalance and discussion about differential diagnosis and evaluation and treatment and answering questions.  He will return for follow-up in 2 months or call earlier if necessary. Antony Contras, MD  [facility] [address] [place], [place] [address]  Phone [phone] Fax [phone] Note: This document was prepared with digital dictation and possible smart phrase technology. Any transcriptional errors that result from this process are unintentional.

## 2020-02-13 LAB — NEUROPATHY PANEL
A/G Ratio: 1.4 (ref 0.7–1.7)
Albumin ELP: 3.4 g/dL (ref 2.9–4.4)
Alpha 1: 0.2 g/dL (ref 0.0–0.4)
Alpha 2: 0.7 g/dL (ref 0.4–1.0)
Angio Convert Enzyme: 32 U/L (ref 14–82)
Anti Nuclear Antibody (ANA): NEGATIVE
Beta: 1 g/dL (ref 0.7–1.3)
Gamma Globulin: 0.6 g/dL (ref 0.4–1.8)
Globulin, Total: 2.5 g/dL (ref 2.2–3.9)
Rheumatoid fact SerPl-aCnc: 10.5 IU/mL (ref 0.0–13.9)
Sed Rate: 3 mm/hr (ref 0–30)
TSH: 0.825 u[IU]/mL (ref 0.450–4.500)
Total Protein: 5.9 g/dL — ABNORMAL LOW (ref 6.0–8.5)
Vit D, 25-Hydroxy: 45.4 ng/mL (ref 30.0–100.0)
Vitamin B-12: 298 pg/mL (ref 232–1245)

## 2020-02-13 LAB — METHYLMALONIC ACID, SERUM: Methylmalonic Acid: 164 nmol/L (ref 0–378)

## 2020-02-13 LAB — HEMOGLOBIN A1C
Est. average glucose Bld gHb Est-mCnc: 111 mg/dL
Hgb A1c MFr Bld: 5.5 % (ref 4.8–5.6)

## 2020-02-14 ENCOUNTER — Inpatient Hospital Stay: Payer: Medicare Other | Admitting: Family Medicine

## 2020-02-14 DIAGNOSIS — N202 Calculus of kidney with calculus of ureter: Secondary | ICD-10-CM | POA: Diagnosis not present

## 2020-02-14 DIAGNOSIS — N132 Hydronephrosis with renal and ureteral calculous obstruction: Secondary | ICD-10-CM | POA: Diagnosis not present

## 2020-02-14 DIAGNOSIS — N23 Unspecified renal colic: Secondary | ICD-10-CM | POA: Diagnosis not present

## 2020-02-14 DIAGNOSIS — K5730 Diverticulosis of large intestine without perforation or abscess without bleeding: Secondary | ICD-10-CM | POA: Diagnosis not present

## 2020-02-18 ENCOUNTER — Other Ambulatory Visit: Payer: Self-pay | Admitting: Urology

## 2020-02-22 ENCOUNTER — Encounter: Payer: Self-pay | Admitting: Gastroenterology

## 2020-02-22 ENCOUNTER — Ambulatory Visit (INDEPENDENT_AMBULATORY_CARE_PROVIDER_SITE_OTHER): Payer: Medicare Other | Admitting: Gastroenterology

## 2020-02-22 VITALS — BP 132/72 | HR 76 | Temp 98.3°F | Ht 70.5 in | Wt 240.2 lb

## 2020-02-22 DIAGNOSIS — R1310 Dysphagia, unspecified: Secondary | ICD-10-CM | POA: Diagnosis not present

## 2020-02-22 DIAGNOSIS — R6881 Early satiety: Secondary | ICD-10-CM

## 2020-02-22 DIAGNOSIS — K219 Gastro-esophageal reflux disease without esophagitis: Secondary | ICD-10-CM | POA: Diagnosis not present

## 2020-02-22 MED ORDER — METOCLOPRAMIDE HCL 5 MG PO TABS: 5.0000 mg | ORAL_TABLET | Freq: Three times a day (TID) | ORAL | 1 refills | Status: DC

## 2020-02-22 NOTE — Progress Notes (Signed)
HPI :  74 year old male here for a follow-up visit for reflux and history of dysphagia.  At the last visit he related his history of dysphagia for over a year, occurs mostly to solids.  He had also had some worsening pyrosis and regurgitation that was bothering him despite omeprazole 20 mg twice a day and Pepcid at night.  We increased his omeprazole to 40 mg twice a day and proceed with an upper endoscopy.  EGD performed in January, he had a 2 cm hiatal hernia, no evidence of Barrett's esophagus or obvious stenosis or stricture.  Empiric dilation was done with savory dilator to 17 mm.  Relook endoscopy showed no mucosal rents.  He had some benign-appearing hyperplastic polyps of the stomach as well.  Since that time he states his dysphagia has resolved and no longer bothering him.  He states the pyrosis is no longer bothering him on the higher dose omeprazole but he continues to have regurgitation postprandially which bothers him quite significantly.  He is also developed some early satiety, has not been eating much due to feeling full easily with regular meals.  His endoscopy showed no evidence of gastric outlet obstruction.  He denies any nausea or vomiting.  No abdominal pains although he does have renal stones which bother him and he is having surgery next week for that.  Incidentally he developed chest pain and shortness of breath within a few days after his endoscopy.  He went to the ER and a CT angiogram showed bilateral pulmonary embolisms.  He was started on Eliquis.  He was seen by oncology, PE potentially thought to have been due to recent fall he had as well as decreased mobility.  He was referred to neurology for his gait.  He has no history of diabetes, A1c normal.  We otherwise reached out for results of his prior colonoscopy which was done in [place], no report came in.  EGD 01/11/20 -  - A 2 cm hiatal hernia was present. - The exam of the esophagus was otherwise normal. No  esophagitis / Barrett's. No obvious stenosis / stricture. - A guidewire was placed and the scope was withdrawn. Empiric dilation was performed in the entire esophagus with a 5mm Savary dilator with mild to moderate resistance at the posterior pharynx UES. Relook endoscopy showed no mucosal wrents. - Multiple small benign appearing sessile polyps were found in the gastric body. Biopsies were taken with a cold forceps for histology from a few polyps as a representative sample. - The exam of the stomach was otherwise normal. - The duodenal bulb and second portion of the duodenum were normal.  Path shows hyperplastic polyps   Past Medical History:  Diagnosis Date  . CAD (coronary artery disease)   . Chronic back pain greater than 3 months duration   . Chronic leg pain   . Deafness in right ear   . Extrinsic asthma, unspecified 04/07/2009   PT DENIES  . GERD (gastroesophageal reflux disease)   . History of kidney stones   . HYPERTENSION 10/29/2007  . HYPOTENSION 08/13/2010  . LOW BACK PAIN 04/21/2008  . NEPHROLITHIASIS, HX OF 04/21/2008  . PUD, HX OF 04/21/2008  . Pulmonary emboli ([place]) 2021   noted on CT 12-2019  . Renal disorder      Past Surgical History:  Procedure Laterality Date  . BACK SURGERY    . bone cancer  1970   rt femur removed and steel rod placed  . CYSTOSCOPY WITH RETROGRADE  PYELOGRAM, URETEROSCOPY AND STENT PLACEMENT Right 11/06/2017   Procedure: CYSTOSCOPY WITH  STENT PLACEMENT RIGHT;  Surgeon: Raynelle Bring, MD;  [facility];  Service: Urology;  Laterality: Right;  . CYSTOSCOPY WITH RETROGRADE PYELOGRAM, URETEROSCOPY AND STENT PLACEMENT Left 06/09/2019   Procedure: CYSTOSCOPY WITH RETROGRADE PYELOGRAM, URETEROSCOPY AND STENT PLACEMENT X2;  Surgeon: [name], MD;  [facility];  Service: Urology;  Laterality: Left;  . CYSTOSCOPY/RETROGRADE/URETEROSCOPY     1 year ago at [place] in [place]  . CYSTOSCOPY/URETEROSCOPY/HOLMIUM LASER Right 11/24/2017    Procedure: CYSTOSCOPY/URETEROSCOPY/ RETROGRADE/STENT REMOVAL;  Surgeon: Raynelle Bring, MD;  [facility];  Service: Urology;  Laterality: Right;  . prosatectomy     Family History  Problem Relation Age of Onset  . COPD Father   . Cancer Other        prostate  . Prostate cancer Brother   . Esophageal cancer Neg Hx   . Stomach cancer Neg Hx   . Pancreatic cancer Neg Hx   . Colon cancer Neg Hx    Social History   Tobacco Use  . Smoking status: Former Smoker    Packs/day: 1.00    Years: 17.00    Pack years: 17.00    Start date: 1963    Quit date: 12/30/1978    Years since quitting: 41.1  . Smokeless tobacco: Never Used  Substance Use Topics  . Alcohol use: No  . Drug use: No   Current Outpatient Medications  Medication Sig Dispense Refill  . amLODipine (NORVASC) 5 MG tablet Take 5 mg by mouth at bedtime.     Marland Kitchen apixaban (ELIQUIS) 5 MG TABS tablet Take 2 tablets (10mg ) twice daily for 7 days, then 1 tablet (5mg ) twice daily (Patient taking differently: Take 5 mg by mouth 2 (two) times daily. ) 60 tablet 1  . atorvastatin (LIPITOR) 20 MG tablet Take 20 mg at bedtime by mouth.     . carvedilol (COREG) 25 MG tablet Take 25 mg 2 (two) times daily by mouth.     . Cholecalciferol (VITAMIN D3) 50 MCG (2000 UT) capsule Take 2,000 Units by mouth in the morning and at bedtime.     Marland Kitchen HYDROmorphone (DILAUDID) 2 MG tablet Take 2 mg by mouth every 6 (six) hours as needed for pain.    . isosorbide mononitrate (IMDUR) 30 MG 24 hr tablet Take 30 mg by mouth daily.     Marland Kitchen omeprazole (PRILOSEC) 20 MG capsule Take 20 mg by mouth 2 (two) times daily.     . predniSONE (DELTASONE) 5 MG tablet Take 5 mg by mouth daily with breakfast.    . tamsulosin (FLOMAX) 0.4 MG CAPS capsule Take 0.4 mg by mouth daily.     No current facility-administered medications for this visit.   No Known Allergies   Review of Systems: All systems reviewed and negative except where noted in HPI.   Lab Results   Component Value Date   WBC 9.1 01/18/2020   HGB 14.8 01/18/2020   HCT 44.5 01/18/2020   MCV 88.9 01/18/2020   PLT 179.0 01/18/2020    Lab Results  Component Value Date   ALT 29 01/18/2020   AST 24 01/18/2020   ALKPHOS 56 01/18/2020   BILITOT 0.7 01/18/2020    Lab Results  Component Value Date   CREATININE 1.42 01/18/2020   BUN 19 01/18/2020   NA 142 01/18/2020   K 4.3 01/18/2020   CL 106 01/18/2020   CO2 30 01/18/2020  Physical Exam: BP 132/72 (BP Location: Left Arm, Patient Position: Sitting, Cuff Size: Normal)   Pulse 76   Temp 98.3 F (36.8 C)   Ht 5' 10.5" (1.791 m) Comment: height measured without shoes  Wt 240 lb 4 oz (109 kg)   BMI 33.99 kg/m  Constitutional: Pleasant,well-developed, male in no acute distress. Neurological: Alert and oriented to person place and time. Psychiatric: Normal mood and affect. Behavior is normal.   ASSESSMENT AND PLAN: 74 year old male here for reassessment of the following:  GERD / early satiety / dysphagia - as above, EGD done which shows no evidence of Barrett's esophagus or stenosis stricture.  Empiric dilation has resolved his dysphagia, unclear if that was oropharyngeal in etiology.  Pyrosis controlled with omeprazole however having regurgitation after he eats as well as early satiety.  Underlying gastroparesis is possible and we discussed options.  He recently had a pulmonary embolism and on anticoagulation, he is also undergoing surgery for renal stones in the next few weeks.  We will hold off on pursuing gastric emptying study right now and give him a trial of very low-dose Reglan 5 mg 2-3 times a day to see if that helps at all.  We discussed risk of Reglan in general, low risk of tar dive dyskinesia which is usually only at higher dosing.  We will give him a few week supply and he can let me know how its going in a few weeks.  He will continue his higher dose omeprazole of 40mg  BID as that has provided some benefit to  his pyrosis in the interim.  If symptoms persist, he is a poor surgical and TIF candidate right now in light of his anticoagulation needs.  Will await his course with further recommendations.  We will otherwise reach out to [facility] to get the report of his last colonoscopy,   Electra Cellar, MD [facility]: [facility]

## 2020-02-22 NOTE — Patient Instructions (Signed)
If you are age 74 or older, your body mass index should be between 23-30. Your Body mass index is 33.99 kg/m. If this is out of the aforementioned range listed, please consider follow up with your Primary Care Provider.  If you are age 25 or younger, your body mass index should be between 19-25. Your Body mass index is 33.99 kg/m. If this is out of the aformentioned range listed, please consider follow up with your Primary Care Provider.   We have sent the following medications to your pharmacy for you to pick up at your convenience: Reglan 5 mg: Take three times a day prior to meals  Continue omeprazole 40mg  twice a day.  Please call us in a month for an update on how you are doing.  We will request your colonoscopy records from [facility].  Thank you for entrusting me with your care and for choosing [facility], Dr. Gordon Cellar

## 2020-02-23 NOTE — Progress Notes (Signed)
CAN YOU PLEASE PLACE SOME ORDERS FOR THE [facility].PT. HAS PST APPOINTMENT ON 02/24/20.THANK YOU.

## 2020-02-23 NOTE — Patient Instructions (Addendum)
DUE TO COVID-19 ONLY ONE VISITOR IS ALLOWED TO COME WITH YOU AND STAY IN THE WAITING ROOM ONLY DURING PRE OP AND PROCEDURE DAY OF SURGERY. THE 1 VISITOR MAY VISIT WITH YOU AFTER SURGERY IN YOUR PRIVATE ROOM DURING VISITING HOURS ONLY!  YOU NEED TO HAVE A COVID 19 TEST ON: 02/26/20 @ 12:15 pm      , THIS TEST MUST BE DONE BEFORE SURGERY, COME  [place], [place] [place] , [address].  ([place]) ONCE YOUR COVID TEST IS COMPLETED, PLEASE BEGIN THE QUARANTINE INSTRUCTIONS AS OUTLINED IN YOUR HANDOUT.                Bruce Ball   Your procedure is scheduled on: 03/01/20   Report to [facility] Main  Entrance   Report to admitting at: 1:45  PM     Call this number if you have problems the morning of surgery [phone]    Remember:   NO SOLID FOOD AFTER MIDNIGHT THE NIGHT PRIOR TO SURGERY. NOTHING BY MOUTH EXCEPT CLEAR LIQUIDS UNTIL: 12:45 PM .   CLEAR LIQUID DIET   Foods Allowed                                                                     Foods Excluded  Coffee and tea, regular and decaf                             liquids that you cannot  Plain Jell-O any favor except red or purple                                           see through such as: Fruit ices (not with fruit pulp)                                     milk, soups, orange juice  Iced Popsicles                                    All solid food Carbonated beverages, regular and diet                                    Cranberry, grape and apple juices Sports drinks like Gatorade Lightly seasoned clear broth or consume(fat free) Sugar, honey syrup  Sample Menu Breakfast                                Lunch                                     Supper Cranberry juice  Beef broth                            Chicken broth Jell-O                                     Grape juice                           Apple juice Coffee or tea                        Jell-O                                       Popsicle                                                Coffee or tea                        Coffee or tea  _____________________________________________________________________      BRUSH YOUR TEETH MORNING OF SURGERY AND RINSE YOUR MOUTH OUT, NO CHEWING GUM CANDY OR MINTS.     Take these medicines the morning of surgery with A SIP OF WATER: AMLODIPINE,CARVEDILOL,ISOSORBIDE,OMEPRAZOLE,PREDNISONE,METOCLOPRAMIDE,TAMSULOSIN.                                 You may not have any metal on your body including hair pins and              piercings  Do not wear jewelry, lotions, powders or perfumes, deodorant.              Men may shave face and neck.              Do not bring valuables to the hospital. [place].  Contacts, dentures or bridgework may not be worn into surgery.  Leave suitcase in the car. After surgery it may be brought to your room.     Patients discharged the day of surgery will not be allowed to drive home. IF YOU ARE HAVING SURGERY AND GOING HOME THE SAME DAY, YOU MUST HAVE AN ADULT TO DRIVE YOU HOME AND BE WITH YOU FOR 24 HOURS. YOU MAY GO HOME BY TAXI OR UBER OR ORTHERWISE, BUT AN ADULT MUST ACCOMPANY YOU HOME AND STAY WITH YOU FOR 24 HOURS.  Name and phone number of your driver:  Special Instructions: N/A              Please read over the following fact sheets you were given: _____________________________________________________________________             [facility] - Preparing for Surgery Before surgery, you can play an important role.  Because skin is not sterile, your skin needs to be as free of germs as possible.  You can reduce the number of germs on your skin by washing with CHG (chlorahexidine gluconate)  soap before surgery.  CHG is an antiseptic cleaner which kills germs and bonds with the skin to continue killing germs even after washing. Please DO NOT use if you have an allergy to CHG or  antibacterial soaps.  If your skin becomes reddened/irritated stop using the CHG and inform your nurse when you arrive at Short Stay. Do not shave (including legs and underarms) for at least 48 hours prior to the first CHG shower.  You may shave your face/neck. Please follow these instructions carefully:  1.  Shower with CHG Soap the night before surgery and the  morning of Surgery.  2.  If you choose to wash your hair, wash your hair first as usual with your  normal  shampoo.  3.  After you shampoo, rinse your hair and body thoroughly to remove the  shampoo.                           4.  Use CHG as you would any other liquid soap.  You can apply chg directly  to the skin and wash                       Gently with a scrungie or clean washcloth.  5.  Apply the CHG Soap to your body ONLY FROM THE NECK DOWN.   Do not use on face/ open                           Wound or open sores. Avoid contact with eyes, ears mouth and genitals (private parts).                       Wash face,  Genitals (private parts) with your normal soap.             6.  Wash thoroughly, paying special attention to the area where your surgery  will be performed.  7.  Thoroughly rinse your body with warm water from the neck down.  8.  DO NOT shower/wash with your normal soap after using and rinsing off  the CHG Soap.                9.  Pat yourself dry with a clean towel.            10.  Wear clean pajamas.            11.  Place clean sheets on your bed the night of your first shower and do not  sleep with pets. Day of Surgery : Do not apply any lotions/deodorants the morning of surgery.  Please wear clean clothes to the hospital/surgery center.  FAILURE TO FOLLOW THESE INSTRUCTIONS MAY RESULT IN THE CANCELLATION OF YOUR SURGERY PATIENT SIGNATURE_________________________________  NURSE SIGNATURE__________________________________  ________________________________________________________________________

## 2020-02-24 ENCOUNTER — Other Ambulatory Visit: Payer: Self-pay

## 2020-02-24 ENCOUNTER — Encounter (HOSPITAL_COMMUNITY): Payer: Self-pay

## 2020-02-24 ENCOUNTER — Telehealth: Payer: Self-pay | Admitting: Gastroenterology

## 2020-02-24 ENCOUNTER — Encounter (HOSPITAL_COMMUNITY)
Admission: RE | Admit: 2020-02-24 | Discharge: 2020-02-24 | Disposition: A | Discharge status: Home / Self Care | Payer: Medicare Other | Source: Ambulatory Visit | Attending: Urology | Admitting: Urology

## 2020-02-24 DIAGNOSIS — Z01812 Encounter for preprocedural laboratory examination: Secondary | ICD-10-CM | POA: Diagnosis not present

## 2020-02-24 DIAGNOSIS — N202 Calculus of kidney with calculus of ureter: Secondary | ICD-10-CM | POA: Diagnosis not present

## 2020-02-24 DIAGNOSIS — Z01818 Encounter for other preprocedural examination: Secondary | ICD-10-CM | POA: Insufficient documentation

## 2020-02-24 DIAGNOSIS — Z86718 Personal history of other venous thrombosis and embolism: Secondary | ICD-10-CM | POA: Diagnosis not present

## 2020-02-24 DIAGNOSIS — Z87442 Personal history of urinary calculi: Secondary | ICD-10-CM | POA: Diagnosis not present

## 2020-02-24 DIAGNOSIS — Z7952 Long term (current) use of systemic steroids: Secondary | ICD-10-CM | POA: Insufficient documentation

## 2020-02-24 DIAGNOSIS — H9191 Unspecified hearing loss, right ear: Secondary | ICD-10-CM | POA: Diagnosis not present

## 2020-02-24 DIAGNOSIS — I2510 Atherosclerotic heart disease of native coronary artery without angina pectoris: Secondary | ICD-10-CM | POA: Insufficient documentation

## 2020-02-24 DIAGNOSIS — Z87891 Personal history of nicotine dependence: Secondary | ICD-10-CM | POA: Diagnosis not present

## 2020-02-24 DIAGNOSIS — Z7901 Long term (current) use of anticoagulants: Secondary | ICD-10-CM | POA: Diagnosis not present

## 2020-02-24 DIAGNOSIS — K219 Gastro-esophageal reflux disease without esophagitis: Secondary | ICD-10-CM | POA: Insufficient documentation

## 2020-02-24 DIAGNOSIS — Z8546 Personal history of malignant neoplasm of prostate: Secondary | ICD-10-CM | POA: Insufficient documentation

## 2020-02-24 DIAGNOSIS — Z86711 Personal history of pulmonary embolism: Secondary | ICD-10-CM | POA: Insufficient documentation

## 2020-02-24 DIAGNOSIS — Z8711 Personal history of peptic ulcer disease: Secondary | ICD-10-CM | POA: Insufficient documentation

## 2020-02-24 DIAGNOSIS — N289 Disorder of kidney and ureter, unspecified: Secondary | ICD-10-CM | POA: Diagnosis not present

## 2020-02-24 DIAGNOSIS — I10 Essential (primary) hypertension: Secondary | ICD-10-CM | POA: Diagnosis not present

## 2020-02-24 DIAGNOSIS — Z79899 Other long term (current) drug therapy: Secondary | ICD-10-CM | POA: Insufficient documentation

## 2020-02-24 HISTORY — DX: Pneumonia, unspecified organism: J18.9

## 2020-02-24 HISTORY — DX: Dyspnea, unspecified: R06.00

## 2020-02-24 MED ORDER — METOCLOPRAMIDE HCL 5 MG PO TABS: 5.0000 mg | ORAL_TABLET | Freq: Three times a day (TID) | ORAL | 1 refills | Status: DC

## 2020-02-24 NOTE — Progress Notes (Unsigned)
Called [facility]. Their system was down on Tuesday when we sent it.  Script resent.

## 2020-02-24 NOTE — Telephone Encounter (Signed)
Called [facility]. Their system was down on Tuesday when we sent it and they did not receive it..  Script resent. Patient notified. When I spoke to him he confirmed that his [facility] colon[facility] 2006.  [facility] while he is there next week. He will keep me posted.

## 2020-02-24 NOTE — Telephone Encounter (Signed)
Patient's wife called stating that [facility] has not received the script for metoclopramide. Please resend

## 2020-02-24 NOTE — Progress Notes (Signed)
PCP - Dr. Yong Channel. LOV: 01/18/20 Cardiologist -   Chest x-ray - 01/13/20. Epic EKG - 01/14/20. Epic. Stress Test -  ECHO - 01/14/20. Epic Cardiac Cath -   Sleep Study -  CPAP -   Fasting Blood Sugar -  Checks Blood Sugar _____ times a day  Blood Thinner Instructions:As per pt.,he received instructions from Dr. Zettie Pho office to continue Eliquis. Aspirin Instructions: Last Dose:  Anesthesia review: Pt. Verbalized still felling shortness of breath after PE.Chart for anesthesia review.  Patient denies shortness of breath, fever, cough and chest pain at PAT appointment   Patient verbalized understanding of instructions that were given to them at the PAT appointment. Patient was also instructed that they will need to review over the PAT instructions again at home before surgery.

## 2020-02-25 ENCOUNTER — Encounter (HOSPITAL_COMMUNITY)
Admission: RE | Admit: 2020-02-25 | Discharge: 2020-02-25 | Disposition: A | Discharge status: Home / Self Care | Payer: Medicare Other | Source: Ambulatory Visit | Attending: Urology | Admitting: Urology

## 2020-02-25 DIAGNOSIS — Z01812 Encounter for preprocedural laboratory examination: Secondary | ICD-10-CM | POA: Diagnosis not present

## 2020-02-25 DIAGNOSIS — Z01818 Encounter for other preprocedural examination: Secondary | ICD-10-CM | POA: Diagnosis not present

## 2020-02-25 DIAGNOSIS — Z7901 Long term (current) use of anticoagulants: Secondary | ICD-10-CM | POA: Diagnosis not present

## 2020-02-25 DIAGNOSIS — N202 Calculus of kidney with calculus of ureter: Secondary | ICD-10-CM | POA: Diagnosis not present

## 2020-02-25 DIAGNOSIS — Z87891 Personal history of nicotine dependence: Secondary | ICD-10-CM | POA: Diagnosis not present

## 2020-02-25 DIAGNOSIS — Z87442 Personal history of urinary calculi: Secondary | ICD-10-CM | POA: Diagnosis not present

## 2020-02-25 LAB — CBC
HCT: 44.5 % (ref 39.0–52.0)
Hemoglobin: 14.5 g/dL (ref 13.0–17.0)
MCH: 29.6 pg (ref 26.0–34.0)
MCHC: 32.6 g/dL (ref 30.0–36.0)
MCV: 90.8 fL (ref 80.0–100.0)
Platelets: 197 10*3/uL (ref 150–400)
RBC: 4.9 MIL/uL (ref 4.22–5.81)
RDW: 13.7 % (ref 11.5–15.5)
WBC: 6.5 10*3/uL (ref 4.0–10.5)
nRBC: 0 % (ref 0.0–0.2)

## 2020-02-25 LAB — BASIC METABOLIC PANEL
Anion gap: 6 (ref 5–15)
BUN: 19 mg/dL (ref 8–23)
CO2: 28 mmol/L (ref 22–32)
Calcium: 8.3 mg/dL — ABNORMAL LOW (ref 8.9–10.3)
Chloride: 108 mmol/L (ref 98–111)
Creatinine, Ser: 1.35 mg/dL — ABNORMAL HIGH (ref 0.61–1.24)
GFR calc Af Amer: 60 mL/min — ABNORMAL LOW (ref >60)
GFR calc non Af Amer: 52 mL/min — ABNORMAL LOW (ref >60)
Glucose, Bld: 98 mg/dL (ref 70–99)
Potassium: 3.6 mmol/L (ref 3.5–5.1)
Sodium: 142 mmol/L (ref 135–145)

## 2020-02-25 NOTE — Progress Notes (Signed)
Anesthesia Chart Review   Case: [id number] Date/Time: 03/01/20 1530   Procedures:      CYSTOSCOPY WITH RETROGRADE PYELOGRAM, URETEROSCOPY AND STENT PLACEMENT (Bilateral ) - 16 MINS     HOLMIUM LASER APPLICATION (Bilateral )   Anesthesia type: Choice   Pre-op diagnosis: RIGHT URETERAL CALCULI, BILATERAL RENAL CALCULI   [facility] ROOM 03 / [facility]   Surgeons: [name], MD      DISCUSSION:74 y.o. former smoker (17 pack years, quit 12/30/78) with h/o HTN, GERD, CAD, PE (on Eliquis), prostate cancer, right ureteral calculi, bilateral renal calculi scheduled for above procedure 03/01/20 with Dr. [name].   He was recently hospitalized between January 14 to January 16, 2020 after presentation with shortness of breath.  He was found to have left lower extremity DVT and bilateral PE.  Will remain on Eliquis for 6 months.  Advised not to hold by Dr. Tresa [name].  Followed by hematology/oncology with last visit 01/27/20, 6 month follow up scheduled.   Anticipate pt can proceed with planned procedure barring acute status change.   VS: BP 135/88   Pulse (!) 59   Temp 36.9 C (Oral)   Resp 16   Ht 5\' 11"  (1.803 m)   Wt 108.9 kg   SpO2 98%   BMI 33.47 kg/m   PROVIDERS: [name], MD  Is PCP   LABS: Labs reviewed: Acceptable for surgery. (all labs ordered are listed, but only abnormal results are displayed)  Labs Reviewed  BASIC METABOLIC PANEL - Abnormal; Notable for the following components:      Result Value   Creatinine, Ser 1.35 (*)    Calcium 8.3 (*)    GFR calc non Af Amer 52 (*)    GFR calc Af Amer 60 (*)    All other components within normal limits  CBC     IMAGES:   EKG: 01/14/20 Rate 79 bpm Normal Sinus rhythm   CV: Echo 01/14/20 IMPRESSIONS    1. Left ventricular ejection fraction, by visual estimation, is 60 to  65%. The left ventricle has normal function. There is moderately increased  left ventricular hypertrophy.  2. Moderate asymmetric  hypertrophy of the basal septum measuring 10mm  (posterior wall 21mm)  3. The mitral valve is normal in structure. No evidence of mitral valve  regurgitation.  4. The aortic valve is tricuspid. Aortic valve regurgitation is trivial.  Mild aortic valve sclerosis without stenosis.  5. The tricuspid valve is grossly normal.  6. The pulmonic valve was not well visualized. Pulmonic valve  regurgitation is trivial.  7. Global right ventricle has normal systolic function.The right  ventricular size is normal.  8. Left atrial size was normal.  9. Right atrial size was normal.  10. TR signal is inadequate for assessing pulmonary artery systolic  pressure.  Past Medical History:  Diagnosis Date  . CAD (coronary artery disease)   . Chronic back pain greater than 3 months duration   . Chronic leg pain   . Deafness in right ear   . Dyspnea    cause by PE  . Extrinsic asthma, unspecified 04/07/2009   PT DENIES  . GERD (gastroesophageal reflux disease)   . History of kidney stones   . HYPERTENSION 10/29/2007  . HYPOTENSION 08/13/2010  . LOW BACK PAIN 04/21/2008  . NEPHROLITHIASIS, HX OF 04/21/2008  . Pneumonia   . PUD, HX OF 04/21/2008  . Pulmonary emboli ([place]) 2021   noted on CT 12-2019  . Renal disorder  Past Surgical History:  Procedure Laterality Date  . BACK SURGERY    . bone cancer  1970   rt femur removed and steel rod placed  . CYSTOSCOPY WITH RETROGRADE PYELOGRAM, URETEROSCOPY AND STENT PLACEMENT Right 11/06/2017   Procedure: CYSTOSCOPY WITH  STENT PLACEMENT RIGHT;  Surgeon: Raynelle Bring, MD;  Location[facility];  Service: Urology;  Laterality: Right;  . CYSTOSCOPY WITH RETROGRADE PYELOGRAM, URETEROSCOPY AND STENT PLACEMENT Left 06/09/2019   Procedure: CYSTOSCOPY WITH RETROGRADE PYELOGRAM, URETEROSCOPY AND STENT PLACEMENT X2;  Surgeon: [name], MD;  Location[facility];  Service: Urology;  Laterality: Left;  . CYSTOSCOPY/RETROGRADE/URETEROSCOPY     1 year ago at [place] in  [place]  . CYSTOSCOPY/URETEROSCOPY/HOLMIUM LASER Right 11/24/2017   Procedure: CYSTOSCOPY/URETEROSCOPY/ RETROGRADE/STENT REMOVAL;  Surgeon: Raynelle Bring, MD;  Location: [facility];  Service: Urology;  Laterality: Right;  . prosatectomy      MEDICATIONS: . amLODipine (NORVASC) 5 MG tablet  . apixaban (ELIQUIS) 5 MG TABS tablet  . atorvastatin (LIPITOR) 20 MG tablet  . carvedilol (COREG) 25 MG tablet  . Cholecalciferol (VITAMIN D3) 50 MCG (2000 UT) capsule  . HYDROmorphone (DILAUDID) 2 MG tablet  . isosorbide mononitrate (IMDUR) 30 MG 24 hr tablet  . metoCLOPramide (REGLAN) 5 MG tablet  . omeprazole (PRILOSEC) 20 MG capsule  . predniSONE (DELTASONE) 5 MG tablet  . tamsulosin (FLOMAX) 0.4 MG CAPS capsule   No current facility-administered medications for this encounter.   Maia Plan [facility] Pre[facility]Surgical Testing [phone] 02/25/20  2:53 PM

## 2020-02-26 ENCOUNTER — Other Ambulatory Visit (HOSPITAL_COMMUNITY)
Admission: RE | Admit: 2020-02-26 | Discharge: 2020-02-26 | Disposition: A | Discharge status: Home / Self Care | Payer: Medicare Other | Source: Ambulatory Visit | Attending: Urology | Admitting: Urology

## 2020-02-26 DIAGNOSIS — Z20822 Contact with and (suspected) exposure to covid-19: Secondary | ICD-10-CM | POA: Insufficient documentation

## 2020-02-26 DIAGNOSIS — Z01812 Encounter for preprocedural laboratory examination: Secondary | ICD-10-CM | POA: Insufficient documentation

## 2020-02-26 LAB — SARS CORONAVIRUS 2 (TAT 6-24 HRS): SARS Coronavirus 2: NEGATIVE

## 2020-02-28 NOTE — Progress Notes (Signed)
Kindly inform the patient that lab work for reversible causes of peripheral neuropathy was all normal

## 2020-02-29 ENCOUNTER — Telehealth: Payer: Self-pay

## 2020-02-29 MED ORDER — GENTAMICIN SULFATE 40 MG/ML IJ SOLN
5.0000 mg/kg | INTRAVENOUS | Status: AC
  Administered 2020-03-01: 440 mg via INTRAVENOUS
  Filled 2020-02-29: qty 11

## 2020-02-29 NOTE — Telephone Encounter (Signed)
-----   Message from Garvin Fila, MD sent at 02/28/2020  1:50 PM EST ----- Kindly inform the patient that lab work for reversible causes of peripheral neuropathy was all normal

## 2020-02-29 NOTE — Telephone Encounter (Signed)
I reached out to the pt and advised of results. Pt verbalized understanding and had no questions/concerns.  

## 2020-03-01 ENCOUNTER — Encounter (HOSPITAL_COMMUNITY): Payer: Self-pay | Admitting: Urology

## 2020-03-01 ENCOUNTER — Ambulatory Visit (HOSPITAL_COMMUNITY): Payer: Medicare Other

## 2020-03-01 ENCOUNTER — Ambulatory Visit (HOSPITAL_COMMUNITY): Payer: Medicare Other | Admitting: Physician Assistant

## 2020-03-01 ENCOUNTER — Ambulatory Visit (HOSPITAL_COMMUNITY)
Admission: RE | Admit: 2020-03-01 | Discharge: 2020-03-01 | Disposition: A | Discharge status: Home / Self Care | Payer: Medicare Other | Source: Ambulatory Visit | Attending: Urology | Admitting: Urology

## 2020-03-01 ENCOUNTER — Encounter (HOSPITAL_COMMUNITY)
Admission: RE | Disposition: A | Discharge status: Home / Self Care | Payer: Self-pay | Source: Ambulatory Visit | Attending: Urology

## 2020-03-01 DIAGNOSIS — E669 Obesity, unspecified: Secondary | ICD-10-CM | POA: Diagnosis not present

## 2020-03-01 DIAGNOSIS — N183 Chronic kidney disease, stage 3 unspecified: Secondary | ICD-10-CM | POA: Diagnosis not present

## 2020-03-01 DIAGNOSIS — N202 Calculus of kidney with calculus of ureter: Secondary | ICD-10-CM | POA: Diagnosis not present

## 2020-03-01 DIAGNOSIS — I2510 Atherosclerotic heart disease of native coronary artery without angina pectoris: Secondary | ICD-10-CM | POA: Diagnosis not present

## 2020-03-01 DIAGNOSIS — I10 Essential (primary) hypertension: Secondary | ICD-10-CM | POA: Insufficient documentation

## 2020-03-01 DIAGNOSIS — Z6833 Body mass index (BMI) 33.0-33.9, adult: Secondary | ICD-10-CM | POA: Diagnosis not present

## 2020-03-01 DIAGNOSIS — Z86711 Personal history of pulmonary embolism: Secondary | ICD-10-CM | POA: Diagnosis not present

## 2020-03-01 DIAGNOSIS — Z87442 Personal history of urinary calculi: Secondary | ICD-10-CM | POA: Diagnosis not present

## 2020-03-01 DIAGNOSIS — Z7901 Long term (current) use of anticoagulants: Secondary | ICD-10-CM | POA: Insufficient documentation

## 2020-03-01 DIAGNOSIS — J45909 Unspecified asthma, uncomplicated: Secondary | ICD-10-CM | POA: Diagnosis not present

## 2020-03-01 DIAGNOSIS — I129 Hypertensive chronic kidney disease with stage 1 through stage 4 chronic kidney disease, or unspecified chronic kidney disease: Secondary | ICD-10-CM | POA: Diagnosis not present

## 2020-03-01 DIAGNOSIS — Z87891 Personal history of nicotine dependence: Secondary | ICD-10-CM | POA: Insufficient documentation

## 2020-03-01 DIAGNOSIS — K219 Gastro-esophageal reflux disease without esophagitis: Secondary | ICD-10-CM | POA: Insufficient documentation

## 2020-03-01 DIAGNOSIS — Q630 Accessory kidney: Secondary | ICD-10-CM | POA: Diagnosis not present

## 2020-03-01 HISTORY — PX: CYSTOSCOPY WITH RETROGRADE PYELOGRAM, URETEROSCOPY AND STENT PLACEMENT: SHX5789

## 2020-03-01 HISTORY — PX: HOLMIUM LASER APPLICATION: SHX5852

## 2020-03-01 SURGERY — CYSTOURETEROSCOPY, WITH RETROGRADE PYELOGRAM AND STENT INSERTION
Anesthesia: General | Laterality: Bilateral

## 2020-03-01 MED ORDER — HYDROMORPHONE HCL 1 MG/ML IJ SOLN
0.2500 mg | INTRAMUSCULAR | Status: DC | PRN
  Administered 2020-03-01: 0.5 mg via INTRAVENOUS

## 2020-03-01 MED ORDER — ONDANSETRON HCL 4 MG/2ML IJ SOLN: 4.0000 mg | Freq: Once | INTRAMUSCULAR | Status: DC | PRN

## 2020-03-01 MED ORDER — FENTANYL CITRATE (PF) 100 MCG/2ML IJ SOLN
INTRAMUSCULAR | Status: DC | PRN
  Administered 2020-03-01 (×2): 50 ug via INTRAVENOUS

## 2020-03-01 MED ORDER — CEPHALEXIN 500 MG PO CAPS: 500.0000 mg | ORAL_CAPSULE | Freq: Two times a day (BID) | ORAL | 0 refills | Status: DC

## 2020-03-01 MED ORDER — HYDROMORPHONE HCL 2 MG PO TABS: 2.0000 mg | ORAL_TABLET | Freq: Four times a day (QID) | ORAL | 0 refills | Status: DC | PRN

## 2020-03-01 MED ORDER — FENTANYL CITRATE (PF) 100 MCG/2ML IJ SOLN
INTRAMUSCULAR | Status: AC
  Filled 2020-03-01: qty 4

## 2020-03-01 MED ORDER — IOHEXOL 300 MG/ML  SOLN
INTRAMUSCULAR | Status: DC | PRN
  Administered 2020-03-01: 30 mL via URETHRAL

## 2020-03-01 MED ORDER — DEXAMETHASONE SODIUM PHOSPHATE 10 MG/ML IJ SOLN
INTRAMUSCULAR | Status: DC | PRN
  Administered 2020-03-01: 5 mg via INTRAVENOUS

## 2020-03-01 MED ORDER — LACTATED RINGERS IV SOLN: INTRAVENOUS | Status: DC

## 2020-03-01 MED ORDER — PROPOFOL 10 MG/ML IV BOLUS
INTRAVENOUS | Status: DC | PRN
  Administered 2020-03-01: 180 mg via INTRAVENOUS

## 2020-03-01 MED ORDER — ONDANSETRON HCL 4 MG/2ML IJ SOLN
INTRAMUSCULAR | Status: DC | PRN
  Administered 2020-03-01: 4 mg via INTRAVENOUS

## 2020-03-01 MED ORDER — SODIUM CHLORIDE 0.9 % IR SOLN
Status: DC | PRN
  Administered 2020-03-01: 3000 mL

## 2020-03-01 MED ORDER — ACETAMINOPHEN 10 MG/ML IV SOLN
INTRAVENOUS | Status: AC
  Filled 2020-03-01: qty 100

## 2020-03-01 MED ORDER — HYDROMORPHONE HCL 1 MG/ML IJ SOLN
INTRAMUSCULAR | Status: AC
  Filled 2020-03-01: qty 1

## 2020-03-01 MED ORDER — FENTANYL CITRATE (PF) 100 MCG/2ML IJ SOLN
INTRAMUSCULAR | Status: AC
  Filled 2020-03-01: qty 2

## 2020-03-01 MED ORDER — LABETALOL HCL 5 MG/ML IV SOLN
INTRAVENOUS | Status: AC
  Filled 2020-03-01: qty 4

## 2020-03-01 MED ORDER — LIDOCAINE 2% (20 MG/ML) 5 ML SYRINGE
INTRAMUSCULAR | Status: DC | PRN
  Administered 2020-03-01: 80 mg via INTRAVENOUS

## 2020-03-01 MED ORDER — PROPOFOL 10 MG/ML IV BOLUS
INTRAVENOUS | Status: AC
  Filled 2020-03-01: qty 20

## 2020-03-01 MED ORDER — KETOROLAC TROMETHAMINE 10 MG PO TABS: 10.0000 mg | ORAL_TABLET | Freq: Three times a day (TID) | ORAL | 0 refills | Status: DC | PRN

## 2020-03-01 MED ORDER — ACETAMINOPHEN 10 MG/ML IV SOLN
1000.0000 mg | Freq: Once | INTRAVENOUS | Status: DC | PRN
  Administered 2020-03-01: 1000 mg via INTRAVENOUS

## 2020-03-01 MED ORDER — FENTANYL CITRATE (PF) 100 MCG/2ML IJ SOLN
25.0000 ug | INTRAMUSCULAR | Status: DC | PRN
  Administered 2020-03-01 (×2): 50 ug via INTRAVENOUS

## 2020-03-01 SURGICAL SUPPLY — 26 items
BAG URO CATCHER STRL LF (MISCELLANEOUS) ×2 IMPLANT
BASKET LASER NITINOL 1.9FR (BASKET) ×1 IMPLANT
BSKT STON RTRVL 120 1.9FR (BASKET) ×1
CATH INTERMIT  6FR 70CM (CATHETERS) ×2 IMPLANT
CLOTH BEACON ORANGE TIMEOUT ST (SAFETY) ×2 IMPLANT
COVER SURGICAL LIGHT HANDLE (MISCELLANEOUS) ×2 IMPLANT
COVER WAND RF STERILE (DRAPES) IMPLANT
EXTRACTOR STONE 1.7FRX115CM (UROLOGICAL SUPPLIES) IMPLANT
FIBER LASER FLEXIVA 1000 (UROLOGICAL SUPPLIES) IMPLANT
FIBER LASER FLEXIVA 365 (UROLOGICAL SUPPLIES) IMPLANT
FIBER LASER FLEXIVA 550 (UROLOGICAL SUPPLIES) IMPLANT
FIBER LASER TRAC TIP (UROLOGICAL SUPPLIES) ×1 IMPLANT
GLOVE BIOGEL M STRL SZ7.5 (GLOVE) ×2 IMPLANT
GOWN STRL REUS W/TWL LRG LVL3 (GOWN DISPOSABLE) ×2 IMPLANT
GUIDEWIRE ANG ZIPWIRE 038X150 (WIRE) ×3 IMPLANT
GUIDEWIRE STR DUAL SENSOR (WIRE) ×4 IMPLANT
KIT TURNOVER KIT A (KITS) IMPLANT
MANIFOLD NEPTUNE II (INSTRUMENTS) ×2 IMPLANT
PACK CYSTO (CUSTOM PROCEDURE TRAY) ×2 IMPLANT
PENCIL SMOKE EVACUATOR (MISCELLANEOUS) IMPLANT
SHEATH URETERAL 12FRX28CM (UROLOGICAL SUPPLIES) IMPLANT
SHEATH URETERAL 12FRX35CM (MISCELLANEOUS) ×1 IMPLANT
STENT POLARIS 5FRX26 (STENTS) ×3 IMPLANT
TUBE FEEDING 8FR 16IN STR KANG (MISCELLANEOUS) ×2 IMPLANT
TUBING CONNECTING 10 (TUBING) ×2 IMPLANT
TUBING UROLOGY SET (TUBING) ×2 IMPLANT

## 2020-03-01 NOTE — Op Note (Signed)
NAME: Bruce Ball, Bruce Ball MEDICAL RECORD [id number] ACCOUNT [id number] DATE OF BIRTH:[birth year] FACILITY: [facility] LOCATION: [facility] PHYSICIAN:Emrie Gayle Tresa [name], MD  OPERATIVE REPORT  DATE OF PROCEDURE:  03/01/2020  SURGEON:  [name], MD  PREOPERATIVE DIAGNOSIS:  Right ureteral and bilateral renal stones.    POSTOPERATIVE DIAGNOSIS:  Right ureteral and bilateral renal stones.    PROCEDURES: 1.  Cystoscopy, bilateral retrograde pyelograms, interpretation. 2.  Bilateral ureteroscopy with laser lithotripsy. 3.  Insertion of bilateral ureteral stents, 5 x 26 Polaris, 1 on the right, 2 on the left.  FINDINGS: 1.  Interval passage of right ureteral stone. 2.  Bilateral intrarenal stones. 3.  Partial duplex left kidney as anticipated. 4.  Successful placement of bilateral ureteral stents, proximal end in the renal pelvis, distal end in the  urinary bladder.  INDICATIONS:  The patient is a pleasant 74 year old man with longstanding history of recurrent urolithiasis.  He is compliant with medical therapy.  Despite this, he does still have occasional episodes of symptomatic stone.  He had an episode of colic  approximately 2 months ago, which was initially severe, but became more manageable over time.  Options were discussed for management, including continued medical therapy versus shockwave lithotripsy versus ureteroscopy.  Given bilateral stone volume, he  wishes to receive bilateral ureteroscopic stimulation with stone free as being goal.  He denies interval passage.  Informed consent was obtained and placed in the medical record.  DESCRIPTION OF PROCEDURE:  The patient being identified, the procedure being bilateral ureteroscopic stimulation was confirmed.  Procedure timeout was performed.  Intravenous antibiotics were administered.  General LMA anesthesia induced.  The patient  was placed into a low lithotomy position.  Sterile field was created, prepping and draping the patient's  penis, perineum and proximal thighs using iodine.  Cystourethroscopy was then performed using a 21-French rigid cystoscope with offset lens.   Inspection of anterior and posterior urethra was unremarkable.  There was a prior prostatectomy defect wide open distal anastomosis.  Bilateral ureteral orifices were singleton.  The right ureteral orifice was cannulated with 6-French renal catheter and  right retrograde pyelogram was obtained.  Right retrograde pyelogram demonstrated a single right ureter, single system right kidney.  No filling defects or hydronephrosis noted.  Notably, the previous right ureteral stone was not visualized.  A 0.03 ZIPwire was advanced to the lower pole and set  aside as a safety wire.  Similarly, left retrograde pyelogram was obtained.  Left retrograde pyelogram demonstrated a duplex left ureter with duplication beginning at approximately the area of the iliac vessels and a bifid kidney above this.  A 0.03 ZIPwire was advanced to the lower pole moiety and set aside as a safety wire.  An  8-French feeding tube was placed in the urinary bladder, pressure released and semirigid ureteroscopy was performed of distal left ureter alongside a separate sensor working wire.  Sensor working wire was placed into the upper pole moiety.  A second  sensor wire was then placed to the lower pole moiety and finally a semirigid ureteroscopy was performed in the distal four-fifths of the right ureter alongside a separate sensor working wire.  This being a third, i was left in place, acting as a working  wire.  Next, a semirigid scope was exchanged for a 12/14 medium length ureteral access sheath to the level of the proximal ureter using continuous fluoroscopic vision and flexible digital ureteroscopy was performed of the proximal right ureter and  systematic inspection of the right kidney, including all  calices x3.  Again, this was corroborated interval passage of his right ureteral stone.  However,  there was multifocal intrarenal stones, mostly papillary tip and mostly in the lower pole.  Holmium  laser energy applied at 70, setting of 0.2 joules and 20 Hz and stones were fragmented into pieces approximately 1-2 mm or less.  Fragments were then removed using escape basket and set aside for discard as his prior composition was already known.   Following this, complete resolution of all accessible stone fragments larger than one-third mm on the right side, the access sheath was removed under continuous vision.  No significant mucosal abnormalities were found.  The access sheath was then placed  over the lower pole moiety, sensor working wire on the left side at the level of the proximal left ureter and flexible digital ureteroscopy was performed of the left lower pole moiety using flexible ureteroscope.  Multifocal papillary tip calcifications  were noted.  Similarly, a holmium laser energy applied to stones, dislodging them.  There were no fragments larger than one-third mm.  The access sheath was once again repositioned to the upper pole moiety with a sensor working wire on the left side and  flexible digital ureteroscopy performed at the proximal left ureter and upper pole moiety.  Similarly, multifocal papillary tip calcifications were noted which were controlled using laser lithotripsy.  There were no stone fragments larger than one-third  mm following this.  Access sheath was removed using continuous vision.  No mucosal abnormalities were found.  Given the bilateral nature of the procedure today, it was felt that brief interval stenting with tethered stents would be warranted.  As such,  new 5 x 26 Polaris-type stents were placed over all remaining safety wires into the right kidney, into the left upper and lower pole moieties respectively.  Tethers were left in place, fastened to the dorsum of the penis.  The procedure was terminated.   The patient tolerated the procedure well.  No immediate  perioperative complications.  The patient was taken to the postanesthesia care unit in stable condition.  Plan for discharge home.  VN/NUANCE  D:03/01/2020 T:03/01/2020 JOB:[id number]

## 2020-03-01 NOTE — Brief Op Note (Signed)
03/01/2020  4:45 PM  PATIENT:  Bruce Ball  74 y.o. male  PRE-OPERATIVE DIAGNOSIS:  RIGHT URETERAL CALCULI, BILATERAL RENAL CALCULI  POST-OPERATIVE DIAGNOSIS:  RIGHT URETERAL CALCULI, BILATERAL RENAL CALCULI  PROCEDURE:  Procedure(s) with comments: CYSTOSCOPY WITH RETROGRADE PYELOGRAM, URETEROSCOPY AND STENT PLACEMENT (Bilateral) - 75 MINS HOLMIUM LASER APPLICATION (Bilateral)  SURGEON:  Surgeon(s) and Role:    [name], MD - Primary  PHYSICIAN ASSISTANT:   ASSISTANTS: none   ANESTHESIA:   general  EBL:  minimal   BLOOD ADMINISTERED:none  DRAINS: none   LOCAL MEDICATIONS USED:  NONE  SPECIMEN:  Source of Specimen:  bilateral renal stone fragments  DISPOSITION OF SPECIMEN:  discard  COUNTS:  YES  TOURNIQUET:  * No tourniquets in log *  DICTATION: .Other Dictation: Dictation Number  [id number]  PLAN OF CARE: Discharge to home after PACU  PATIENT DISPOSITION:  PACU - hemodynamically stable.   Delay start of Pharmacological VTE agent (>24hrs) due to surgical blood loss or risk of bleeding: no

## 2020-03-01 NOTE — Anesthesia Postprocedure Evaluation (Signed)
Anesthesia Post Note  Patient: Bruce Ball  Procedure(s) Performed: CYSTOSCOPY WITH RETROGRADE PYELOGRAM, URETEROSCOPY AND STENT PLACEMENT (Bilateral ) HOLMIUM LASER APPLICATION (Bilateral )     Patient location during evaluation: PACU Anesthesia Type: General Level of consciousness: awake and alert Pain management: pain level controlled Vital Signs Assessment: post-procedure vital signs reviewed and stable Respiratory status: spontaneous breathing, nonlabored ventilation, respiratory function stable and patient connected to nasal cannula oxygen Cardiovascular status: blood pressure returned to baseline and stable Postop Assessment: no apparent nausea or vomiting Anesthetic complications: no    Last Vitals:  Vitals:   03/01/20 1745 03/01/20 1755  BP: (!) 165/93 (!) 160/94  Pulse: 63 68  Resp: 17 20  Temp: 36.4 C 36.4 C  SpO2: 95% 96%    Last Pain:  Vitals:   03/01/20 1755  TempSrc:   PainSc: 7                  [name]

## 2020-03-01 NOTE — Anesthesia Procedure Notes (Signed)
Procedure Name: LMA Insertion Performed by: Gean Maidens, CRNA Pre-anesthesia Checklist: Patient identified, Emergency Drugs available, Suction available and Timeout performed Patient Re-evaluated:Patient Re-evaluated prior to induction Oxygen Delivery Method: Circle system utilized Preoxygenation: Pre-oxygenation with 100% oxygen Induction Type: IV induction Ventilation: Mask ventilation without difficulty LMA: LMA inserted LMA Size: 5.0 Number of attempts: 1 Placement Confirmation: positive ETCO2 and breath sounds checked- equal and bilateral Tube secured with: Tape Dental Injury: Teeth and Oropharynx as per pre-operative assessment

## 2020-03-01 NOTE — H&P (Signed)
Bruce Ball is an 74 y.o. male.    Chief Complaint: Pre-Op BILATERAL Ureteroscopic Stone Manipulation  HPI:   1- Recurrent Urolithiasis -  Pre 2020 many stones treated with SWL, URS, MET  05/2019 - Left ureteroscopy to stone free on left. About 1cm Rt intrarenal stone as well.  02/2020 - Rt 12m ureteral, bilateral scattered renal stoens.   2 - Medical Stone Disease -  Eval 2020: BMP, PTH, Urate - normal; Composition - 100% CaOx; 24 Hr Urines - unable to obtain as in diapers ==> increase PO citrate and daily indapamide 1.25.   3- [facility] PSA 2020 <0.01.   4 - Total Incontinence [facility] with [facility]   5 - Left Renal Partial Duplication - Left duplex kidney / ureteters to distal 1/3 of ureter noted at left ureteroscopy 2020.   PMH sig for Rt TKR, obesity, DJD, HTN. NO ischemic CV disease / blood thinners. His PCP is [facility]   Today "Bruce Ball is seen to proceed with BILATERAL ureteroscoyp for right ureteral and bilateral renal stones. Most recent UCX negative, C19 screen negative,      Past Medical History:  Diagnosis Date  . CAD (coronary artery disease)   . Chronic back pain greater than 3 months duration   . Chronic leg pain   . Deafness in right ear   . Dyspnea    cause by PE  . Extrinsic asthma, unspecified 04/07/2009   PT DENIES  . GERD (gastroesophageal reflux disease)   . History of kidney stones   . HYPERTENSION 10/29/2007  . HYPOTENSION 08/13/2010  . LOW BACK PAIN 04/21/2008  . NEPHROLITHIASIS, HX OF 04/21/2008  . Pneumonia   . PUD, HX OF 04/21/2008  . [place]   noted [place] CT 12-2019  . Renal disorder     Past Surgical History:  Procedure Laterality Date  . BACK SURGERY    . bone cancer  1970   rt femur removed and steel rod placed  . CYSTOSCOPY WITH RETROGRADE PYELOGRAM, URETEROSCOPY AND STENT  PLACEMENT Right 11/06/2017   Procedure: CYSTOSCOPY WITH  STENT PLACEMENT RIGHT;  Surgeon: Bruce Bring MD;  Location: [facility];  Service: Urology;  Laterality: Right;  . CYSTOSCOPY WITH RETROGRADE PYELOGRAM, URETEROSCOPY AND STENT PLACEMENT Left 06/09/2019   Procedure: CYSTOSCOPY WITH RETROGRADE PYELOGRAM, URETEROSCOPY AND STENT PLACEMENT X2;  Surgeon: MAlexis Frock MD;  Location: [facility];  Service: Urology;  Laterality: Left;  . [place]TR[place]GR[place] [place] CYSTOSCOPY/URETEROSCOPY/HOLMIUM LASER Right 11/24/2017   Procedure: CYSTOSCOPY/URETEROSCOPY/ RETROGRADE/STENT REMOVAL;  Surgeon: Bruce Bring MD;  Location: [facility];  Service: Urology;  Laterality: Right;  . prosatectomy      Family History  Problem Relation Age of Onset  . COPD Father   . Cancer Other        prostate  . Prostate cancer Brother   . Esophageal cancer Neg Hx   . Stomach cancer Neg Hx   . Pancreatic cancer Neg Hx   . Colon cancer Neg Hx    Social History:  reports that he quit smoking about 41 years ago. He started smoking about 58 years ago. He has a 17.00 pack-year smoking history. He has never used smokeless tobacco. He reports that he does not drink alcohol or use drugs.  Allergies: No Known Allergies  No medications prior to admission.    No results found for this or any previous visit (from the past 48 hour(s)). No results found.  Review of Systems  Constitutional: Negative for fever.  Genitourinary: Positive for flank pain.  All other systems reviewed and are negative.   There were no vitals taken for this visit. Physical Exam  Constitutional: He appears well-developed.  HENT:  Head: Normocephalic.  Eyes: Pupils are equal, round, and reactive to light.  Cardiovascular: Normal rate.  Respiratory: Effort normal.  GI: Soft.  Mild obestiy. Prior scars w/o hernias.   Genitourinary:    Genitourinary Comments: Mild Rt CVAT, Total incontinence.     Musculoskeletal:        General: Normal range of motion.     Cervical back: Normal range of motion.  Neurological: He is alert.  Skin: Skin is warm.  Psychiatric: He has a normal mood and affect.     Assessment/Plan  Proceed as planned with BILATERAL Ureteroscopic stone manipulation with goal of stone free. Risks, benefits, alternatives, expected peri-op course includgin need for at least temporary stents since bilateral procedure discussed previously and reiterated today.   Bruce Frock, MD 03/01/2020, 8:33 AM

## 2020-03-01 NOTE — Transfer of Care (Signed)
Immediate Anesthesia Transfer of Care Note  Patient: Bruce Ball  Procedure(s) Performed: CYSTOSCOPY WITH RETROGRADE PYELOGRAM, URETEROSCOPY AND STENT PLACEMENT (Bilateral ) HOLMIUM LASER APPLICATION (Bilateral )  Patient Location: PACU  Anesthesia Type:General  Level of Consciousness: sedated, patient cooperative and responds to stimulation  Airway & Oxygen Therapy: Patient Spontanous Breathing and Patient connected to face mask oxygen  Post-op Assessment: Report given to RN and Post -op Vital signs reviewed and stable  Post vital signs: Reviewed and stable  Last Vitals:  Vitals Value Taken Time  BP 158/91 03/01/20 1656  Temp    Pulse 64 03/01/20 1659  Resp 11 03/01/20 1659  SpO2 100 % 03/01/20 1659  Vitals shown include unvalidated device data.  Last Pain:  Vitals:   03/01/20 1412  TempSrc:   PainSc: 9          Complications: No apparent anesthesia complications

## 2020-03-01 NOTE — Discharge Instructions (Signed)
1 - You may have urinary urgency (bladder spasms) and bloody urine on / off with stent in place. This is normal.  2 - Remove tethered stents on Friday morning at home by pulling on strings, the blue-white plastic tubing, and discarding. There are THREE stents. Office is open Friday if any problems arise.   3 -  Call MD or go to ER for fever >102, severe pain / nausea / vomiting not relieved by medications, or acute change in medical status   General Anesthesia, Adult, Care After This sheet gives you information about how to care for yourself after your procedure. Your health care provider may also give you more specific instructions. If you have problems or questions, contact your health care provider. What can I expect after the procedure? After the procedure, the following side effects are common:  Pain or discomfort at the IV site.  Nausea.  Vomiting.  Sore throat.  Trouble concentrating.  Feeling cold or chills.  Weak or tired.  Sleepiness and fatigue.  Soreness and body aches. These side effects can affect parts of the body that were not involved in surgery. Follow these instructions at home:  For at least 24 hours after the procedure:  Have a responsible adult stay with you. It is important to have someone help care for you until you are awake and alert.  Rest as needed.  Do not: ? Participate in activities in which you could fall or become injured. ? Drive. ? Use heavy machinery. ? Drink alcohol. ? Take sleeping pills or medicines that cause drowsiness. ? Make important decisions or sign legal documents. ? Take care of children on your own. Eating and drinking  Follow any instructions from your health care provider about eating or drinking restrictions.  When you feel hungry, start by eating small amounts of foods that are soft and easy to digest (bland), such as toast. Gradually return to your regular diet.  Drink enough fluid to keep your urine pale  yellow.  If you vomit, rehydrate by drinking water, juice, or clear broth. General instructions  If you have sleep apnea, surgery and certain medicines can increase your risk for breathing problems. Follow instructions from your health care provider about wearing your sleep device: ? Anytime you are sleeping, including during daytime naps. ? While taking prescription pain medicines, sleeping medicines, or medicines that make you drowsy.  Return to your normal activities as told by your health care provider. Ask your health care provider what activities are safe for you.  Take over-the-counter and prescription medicines only as told by your health care provider.  If you smoke, do not smoke without supervision.  Keep all follow-up visits as told by your health care provider. This is important. Contact a health care provider if:  You have nausea or vomiting that does not get better with medicine.  You cannot eat or drink without vomiting.  You have pain that does not get better with medicine.  You are unable to pass urine.  You develop a skin rash.  You have a fever.  You have redness around your IV site that gets worse. Get help right away if:  You have difficulty breathing.  You have chest pain.  You have blood in your urine or stool, or you vomit blood. Summary  After the procedure, it is common to have a sore throat or nausea. It is also common to feel tired.  Have a responsible adult stay with you for the first 24  hours after general anesthesia. It is important to have someone help care for you until you are awake and alert.  When you feel hungry, start by eating small amounts of foods that are soft and easy to digest (bland), such as toast. Gradually return to your regular diet.  Drink enough fluid to keep your urine pale yellow.  Return to your normal activities as told by your health care provider. Ask your health care provider what activities are safe for  you. This information is not intended to replace advice given to you by your health care provider. Make sure you discuss any questions you have with your health care provider. Document Revised: 12/19/2017 Document Reviewed: 08/01/2017 Elsevier Patient Education  [place].

## 2020-03-01 NOTE — Anesthesia Preprocedure Evaluation (Addendum)
Anesthesia Evaluation  Patient identified by MRN, date of birth, ID band Patient awake    Reviewed: Allergy & Precautions, NPO status , Unable to perform ROS - Chart review only  Airway Mallampati: II  TM Distance: >3 FB Neck ROM: Full    Dental no notable dental hx. (+) Teeth Intact, Dental Advisory Given   Pulmonary asthma , former smoker,    Pulmonary exam normal breath sounds clear to auscultation       Cardiovascular hypertension, + CAD  Normal cardiovascular exam Rhythm:Regular Rate:Normal  Hx of PE on Eliquis   Neuro/Psych negative neurological ROS  negative psych ROS   GI/Hepatic Neg liver ROS, GERD  ,  Endo/Other  negative endocrine ROS  Renal/GU K+ 3.6 Cr 1.35     Musculoskeletal   Abdominal (+) + obese,   Peds  Hematology negative hematology ROS (+) Hgb 14.5   Anesthesia Other Findings   Reproductive/Obstetrics negative OB ROS                            Anesthesia Physical Anesthesia Plan  ASA: II  Anesthesia Plan: General   Post-op Pain Management:    Induction: Intravenous  PONV Risk Score and Plan: Treatment may vary due to age or medical condition  Airway Management Planned: LMA  Additional Equipment: None  Intra-op Plan:   Post-operative Plan:   Informed Consent: I have reviewed the patients History and Physical, chart, labs and discussed the procedure including the risks, benefits and alternatives for the proposed anesthesia with the patient or authorized representative who has indicated his/her understanding and acceptance.     Dental advisory given  Plan Discussed with:   Anesthesia Plan Comments:        Anesthesia Quick Evaluation

## 2020-03-06 DIAGNOSIS — N202 Calculus of kidney with calculus of ureter: Secondary | ICD-10-CM | POA: Diagnosis not present

## 2020-03-06 DIAGNOSIS — C61 Malignant neoplasm of prostate: Secondary | ICD-10-CM | POA: Diagnosis not present

## 2020-03-06 DIAGNOSIS — N3945 Continuous leakage: Secondary | ICD-10-CM | POA: Diagnosis not present

## 2020-03-09 DIAGNOSIS — N202 Calculus of kidney with calculus of ureter: Secondary | ICD-10-CM | POA: Diagnosis not present

## 2020-03-09 DIAGNOSIS — C61 Malignant neoplasm of prostate: Secondary | ICD-10-CM | POA: Diagnosis not present

## 2020-03-13 ENCOUNTER — Ambulatory Visit (INDEPENDENT_AMBULATORY_CARE_PROVIDER_SITE_OTHER): Payer: Medicare Other | Admitting: Neurology

## 2020-03-13 ENCOUNTER — Other Ambulatory Visit: Payer: Self-pay

## 2020-03-13 ENCOUNTER — Encounter: Payer: Self-pay | Admitting: Neurology

## 2020-03-13 DIAGNOSIS — G629 Polyneuropathy, unspecified: Secondary | ICD-10-CM

## 2020-03-13 DIAGNOSIS — G609 Hereditary and idiopathic neuropathy, unspecified: Secondary | ICD-10-CM

## 2020-03-13 NOTE — Progress Notes (Signed)
[facility] [place]    Nerve / Sites Muscle Latency Ref. Amplitude Ref. Rel Amp Segments Distance Velocity Ref. Area    ms ms mV mV %  cm m/s m/s mVms  R Peroneal - EDB     Ankle EDB 5.6 ?6.5 0.7 ?2.0 100 Ankle - EDB 9   2.7     Fib head EDB 12.7  0.6  86.7 Fib head - Ankle 31 44 ?44 2.8     Pop fossa EDB 15.0  0.6  100 Pop fossa - Fib head 10 44 ?44 2.8         Pop fossa - Ankle      L Peroneal - EDB     Ankle EDB 5.1 ?6.5 1.2 ?2.0 100 Ankle - EDB 9   3.8     Fib head EDB 12.9  1.2  98 Fib head - Ankle 31 40 ?44 6.0     Pop fossa EDB 15.5  1.2  94.8 Pop fossa - Fib head 10 39 ?44 4.6         Pop fossa - Ankle      R Tibial - AH     Ankle AH 4.9 ?5.8 2.0 ?4.0 100 Ankle - AH 9   5.5     Pop fossa AH 15.0  1.5  76.7 Pop fossa - Ankle 41 41 ?41 4.3  L Tibial - AH     Ankle AH 4.7 ?5.8 4.3 ?4.0 100 Ankle - AH 9   8.2     Pop fossa AH 14.6  3.1  72.9 Pop fossa - Ankle 41 41 ?41 7.0             SNC    Nerve / Sites Rec. Site Peak Lat Ref.  Amp Ref. Segments Distance    ms ms V V  cm  R Sural - Ankle (Calf)     Calf Ankle 4.0 ?4.4 9 ?6 Calf - Ankle 14  L Sural - Ankle (Calf)     Calf Ankle 4.0 ?4.4 8 ?6 Calf - Ankle 14  R Superficial peroneal - Ankle     Lat leg Ankle 4.4 ?4.4 3 ?6 Lat leg - Ankle 14  L Superficial peroneal - Ankle     Lat leg Ankle 4.0 ?4.4 8 ?6 Lat leg - Ankle 14             F  Wave    Nerve F Lat Ref.   ms ms  R Tibial - AH 58.5 ?56.0  L Tibial - AH 57.3 ?56.0

## 2020-03-13 NOTE — Telephone Encounter (Signed)
At February 2021 appt patient was asked about trying to get his colonoscopy records from the [place].  He indicated he has asked for them but has been unable to get them.  He had a colonoscopy in 2006 so he should have had one around 2016.  I spend over an hour on the phone waiting for Medical Records and then speaking to Vicente Males in the GI dept [phone]) who allowed me to fax the signed release to her [phone] and then she agreed to walk it down to the gentleman in medical records who told her the fax machine was not working. I received 37 pages of records for pt but NO colonoscopy report.  Called and spoke to [facility] who indicated they do NOT have a colonoscopy report for this pt.  They only see that he had a Normal colonoscopy 10-21-2005.

## 2020-03-13 NOTE — Progress Notes (Signed)
Please refer to EMG and nerve conduction procedure note.  

## 2020-03-13 NOTE — Procedures (Signed)
     HISTORY:  Bruce Ball is a 73 year old gentleman with a prior history of lumbosacral spine surgery.  The patient is had a 2-year history of some numbness in the bottom of the feet bilaterally, he has had some weakness in the legs.  He had one episode of transient numbness up to the waistline level with bilateral leg weakness and a fall previously.  This has not recurred.  The patient has symptoms that are worse in the left leg than the right.  He has been evaluated for a possible lumbosacral radiculopathy or a peripheral neuropathy.  NERVE CONDUCTION STUDIES:  Nerve conduction studies were performed on both lower extremities.  The distal motor latencies for the peroneal and posterior tibial nerves were normal bilaterally with low motor amplitudes seen for the peroneal nerves bilaterally and for the right posterior tibial nerve, normal for the left posterior tibial nerve.  Some slowing of nerve conduction velocities was seen above and below the fibular head for the left peroneal nerve, otherwise nerve conductions for the right peroneal nerve and for the posterior tibial nerves bilaterally were normal.  The sensory latencies for the sural and peroneal nerves were within normal limits bilaterally.  The F-wave latencies for the posterior tibial nerves were prolonged bilaterally.  EMG STUDIES:  EMG study was performed on the left lower extremity:  The tibialis anterior muscle reveals 2 to 4K motor units with slightly decreased recruitment. No fibrillations or positive waves were seen. The peroneus tertius muscle reveals 1 to 3K motor units with decreased recruitment. No fibrillations or positive waves were seen. The medial gastrocnemius muscle reveals 1 to 3K motor units with slightly decreased recruitment. No fibrillations or positive waves were seen. The vastus lateralis muscle reveals 2 to 4K motor units with full recruitment. No fibrillations or positive waves were seen. The iliopsoas muscle  reveals 2 to 4K motor units with full recruitment. No fibrillations or positive waves were seen. The biceps femoris muscle (long head) reveals 2 to 4K motor units with full recruitment. No fibrillations or positive waves were seen. The lumbosacral paraspinal muscles were tested at 3 levels, and revealed no abnormalities of insertional activity at all 3 levels tested. There was good relaxation.   IMPRESSION:  Nerve conduction studies done on both lower extremities shows some evidence of mainly motor involvement of the lower extremities, a sensorimotor peripheral neuropathy cannot be confirmed.  EMG evaluation of the left lower extremity shows some mild distal chronic neuropathic signs of denervation, no evidence of an acute overlying radiculopathy is seen.  Jill Alexanders MD 03/13/2020 1:44 PM  [facility] [address] [place], [place] [address]  Phone [phone] Fax [phone]

## 2020-03-28 ENCOUNTER — Encounter: Payer: Self-pay | Admitting: Family Medicine

## 2020-03-29 ENCOUNTER — Other Ambulatory Visit: Payer: Self-pay

## 2020-03-29 ENCOUNTER — Ambulatory Visit (INDEPENDENT_AMBULATORY_CARE_PROVIDER_SITE_OTHER): Payer: Medicare Other

## 2020-03-29 ENCOUNTER — Ambulatory Visit (INDEPENDENT_AMBULATORY_CARE_PROVIDER_SITE_OTHER): Payer: Medicare Other | Admitting: Family Medicine

## 2020-03-29 ENCOUNTER — Telehealth: Payer: Self-pay | Admitting: Family Medicine

## 2020-03-29 ENCOUNTER — Encounter: Payer: Self-pay | Admitting: Family Medicine

## 2020-03-29 VITALS — BP 124/66 | HR 91 | Temp 97.6°F | Wt 229.6 lb

## 2020-03-29 DIAGNOSIS — M25551 Pain in right hip: Secondary | ICD-10-CM

## 2020-03-29 DIAGNOSIS — Z96641 Presence of right artificial hip joint: Secondary | ICD-10-CM | POA: Diagnosis not present

## 2020-03-29 DIAGNOSIS — M25561 Pain in right knee: Secondary | ICD-10-CM

## 2020-03-29 DIAGNOSIS — S79911A Unspecified injury of right hip, initial encounter: Secondary | ICD-10-CM | POA: Diagnosis not present

## 2020-03-29 NOTE — Progress Notes (Signed)
Subjective:     Patient ID: Bruce Ball, male   DOB: 10/11/1946, 74 y.o.   MRN: [id number]  HPI Bruce Ball is seen today as a work in after a fall about 3 weeks ago at home.  He has had some right knee and right hip pain since then.  Ambulates with walker at baseline.   He has multiple chronic problems including history of hypertension, pulmonary emboli, essential tremor, idiopathic neuropathy, ankylosing spondylitis, chronic kidney disease, history of kidney stones, past history of prostate cancer.  Recent history is that he states about 3 weeks ago he was sitting on the toilet at home and both legs went numb.  He ended up falling over to the right.  He landed on his right hip and knee region.  Has had previous right total hip replacement years ago.  He is on Eliquis for pulmonary emboli which were diagnosed in January.  He had recent urologic procedure for stone removal.  There is no syncope with recent fall and no head injury.  He has had some persistent right knee and right hip pain since the fall.  He had extensive right lateral bruising of the hip with the fall.  He ambulates with a walker.  Has been able to bear weight.  Recently saw neurology for EMG and nerve conduction velocities.  He does have some chronic low back pain.  Family have questions regarding whether he should get MRI lumbar spine.  They consulted with his neurosurgeon and he directed them to primary care  Past Medical History:  Diagnosis Date  . CAD (coronary artery disease)   . Chronic back pain greater than 3 months duration   . Chronic leg pain   . Deafness in right ear   . Dyspnea    cause by PE  . Extrinsic asthma, unspecified 04/07/2009   PT DENIES  . GERD (gastroesophageal reflux disease)   . History of kidney stones   . HYPERTENSION 10/29/2007  . HYPOTENSION 08/13/2010  . LOW BACK PAIN 04/21/2008  . NEPHROLITHIASIS, HX OF 04/21/2008  . Pneumonia   . PUD, HX OF 04/21/2008  . Pulmonary emboli ([place]) 2021    noted on CT 12-2019  . Renal disorder    Past Surgical History:  Procedure Laterality Date  . BACK SURGERY    . bone cancer  1970   rt femur removed and steel rod placed  . CYSTOSCOPY WITH RETROGRADE PYELOGRAM, URETEROSCOPY AND STENT PLACEMENT Right 11/06/2017   Procedure: CYSTOSCOPY WITH  STENT PLACEMENT RIGHT;  Surgeon: Raynelle Bring, MD;  Location: [facility];  Service: Urology;  Laterality: Right;  . CYSTOSCOPY WITH RETROGRADE PYELOGRAM, URETEROSCOPY AND STENT PLACEMENT Left 06/09/2019   Procedure: CYSTOSCOPY WITH RETROGRADE PYELOGRAM, URETEROSCOPY AND STENT PLACEMENT X2;  Surgeon: [name], MD;  Location: [facility];  Service: Urology;  Laterality: Left;  . CYSTOSCOPY WITH RETROGRADE PYELOGRAM, URETEROSCOPY AND STENT PLACEMENT Bilateral 03/01/2020   Procedure: CYSTOSCOPY WITH RETROGRADE PYELOGRAM, URETEROSCOPY AND STENT PLACEMENT;  Surgeon: [name], MD;  Location: [facility];  Service: Urology;  Laterality: Bilateral;  75 MINS  . CYSTOSCOPY/RETROGRADE/URETEROSCOPY     1 year ago at [place] in [place]  . CYSTOSCOPY/URETEROSCOPY/HOLMIUM LASER Right 11/24/2017   Procedure: CYSTOSCOPY/URETEROSCOPY/ RETROGRADE/STENT REMOVAL;  Surgeon: Raynelle Bring, MD;  Location: [facility];  Service: Urology;  Laterality: Right;  . HOLMIUM LASER APPLICATION Bilateral [id number]   Procedure: HOLMIUM LASER APPLICATION;  Surgeon: [name], MD;  Location: [facility];  Service: Urology;  Laterality: Bilateral;  . prosatectomy  reports that he quit smoking about 41 years ago. He started smoking about 58 years ago. He has a 17.00 pack-year smoking history. He has never used smokeless tobacco. He reports that he does not drink alcohol or use drugs. family history includes COPD in his father; Cancer in an other family member; Prostate cancer in his brother. No Known Allergies   Review of Systems  Constitutional: Negative for chills and fever.  Respiratory: Negative for cough and shortness of breath.    Cardiovascular: Negative for chest pain.  Gastrointestinal: Negative for abdominal pain.  Genitourinary: Negative for dysuria.  Musculoskeletal: Positive for arthralgias and back pain.  Neurological: Negative for dizziness and syncope.  Psychiatric/Behavioral: Negative for confusion.       Objective:   Physical Exam Vitals reviewed.  Constitutional:      Appearance: Normal appearance.  Cardiovascular:     Rate and Rhythm: Normal rate and regular rhythm.  Pulmonary:     Effort: Pulmonary effort is normal.     Breath sounds: Normal breath sounds.  Musculoskeletal:     Right lower leg: No edema.     Left lower leg: No edema.     Comments: Right knee reveals no effusion.  No visible ecchymosis.  He has full range of motion.  He does have some lateral knee tenderness around the joint line and around the distal femoral condyle region.  He has fairly good range of motion right hip.  He has significant right lateral hip tenderness  Neurological:     Mental Status: He is alert.        Assessment:     Recent fall with persistent right knee and right hip pain.  He has remote history of right total hip replacement.  Is maintained on anticoagulation with Eliquis for pulmonary emboli history    Plan:     -Plain x-rays right knee and right hip to further assess  -We have recommended follow-up with primary provider regarding his intermittent bilateral leg numbness  Eulas Post MD [facility] at [facility]

## 2020-03-29 NOTE — Telephone Encounter (Signed)
Pt scheduled 03/19/20 with Dr. Elease Hashimoto at [facility].

## 2020-03-29 NOTE — Telephone Encounter (Signed)
Chief Complaint Hip Injury Reason for Call Symptomatic / Request for Health Information Initial Comment Caller feel about a three weeks ago and he is still having trouble with his right hip and leg. It seems to be getting worse. [place] Not Listed States he will go to ED on [address] that is part of [place] [facility] No Nurse Assessment Nurse: Hardin Negus, RN, Danae Chen Date/Time (Eastern Time): 03/28/2020 5:35:59 PM Confirm and document reason for call. If symptomatic, describe symptoms. ---Caller states he fell about 3 weeks and not his right hip and leg are getting worse. Is able to walk only by using walker- does not usually have to use walker around the house. Has a large bruise on right hip. Has the patient had close contact with a person known or suspected to have the novel coronavirus illness OR traveled / lives in area with major community spread (including international travel) in the last 14 days from the onset of symptoms? * If Asymptomatic, screen for exposure and travel within the last 14 days. ---No Does the patient have any new or worsening symptoms? ---Yes Will a triage be completed? ---Yes Related visit to physician within the last 2 weeks? ---No Does the PT have any chronic conditions? (i.e. diabetes, asthma, this includes High risk factors for pregnancy, etc.) ---Yes List chronic conditions. ---HTN pulmonary embolism hx of surgery on both kidneys 3 weeks ago Is this a behavioral health or substance abuse call? ---No PLEASE NOTE: All timestamps contained within this report are represented as [place] Standard Time. CONFIDENTIALTY NOTICE: This fax transmission is intended only for the addressee. It contains information that is legally privileged, confidential or otherwise protected from use or disclosure. If you are not the intended recipient, you are strictly prohibited from reviewing, disclosing, copying using or disseminating any of this information or taking  any action in reliance on or regarding this information. If you have received this fax in error, please notify us immediately by telephone so that we can arrange for its return to [name]. Phone: [phone], Toll-Free: [phone], Fax: [phone] Page: 2 of 2 Call Id: [id number] Guidelines Guideline Title Affirmed Question Affirmed Notes Nurse Date/Time Eilene Ghazi Time) Leg Injury [1] SEVERE pain (e.g. excruciating) AND [2] not improved 2 hours after pain medicine/ice packs Hardin Negus, RN, Danae Chen 03/28/2020 5:40:52 PM Disp. Time Eilene Ghazi Time) Disposition Final User 03/28/2020 5:47:28 PM See HCP within 4 Hours (or PCP triage) Yes Hardin Negus, RN, Dierdre Highman Disagree/Comply Comply Caller Understands Yes PreDisposition Call Doctor Care Advice Given Per Guideline SEE HCP WITHIN 4 HOURS (OR PCP TRIAGE): * IF OFFICE WILL BE CLOSED AND NO PCP (PRIMARY CARE PROVIDER) SECOND-LEVEL TRIAGE: You need to be seen within the next 3 or 4 hours. A nearby Urgent Care Center [facility]) is often a good source of care. Another choice is to go to the ED. Go sooner if you become worse. NO STANDING: * Try not to put any weight on the injured leg. ANOTHER ADULT SHOULD DRIVE: * It is better and safer if another adult drives instead of you. USE A COLD PACK FOR PAIN, SWELLING, OR BRUISING: * Put a cold pack or an ice bag (wrapped in a moist towel) on the area for 20 minutes. PAIN MEDICINES: * ACETAMINOPHEN - REGULAR STRENGTH TYLENOL: Take 650 mg (two 325 mg pills) by mouth every 4 to 6 hours as needed. Each Regular Strength Tylenol pill has 325 mg of acetaminophen. The most you should take each day is 3,250 mg (10 pills a day). * Before  taking any medicine, read all the instructions on the package. CALL BACK IF: * You become worse. CARE ADVICE given per Leg Injury (Adult) guideline. Comments User: Ferdinand Cava, RN Date/Time Eilene Ghazi Time): 03/28/2020 5:41:16 PM pain in bottom portion of right leg- lidocaine patch is not  helping User: Ferdinand Cava, RN Date/Time Eilene Ghazi Time): 03/28/2020 5:43:21 PM taking tylenol- pain 9/10- tylenol does not help leg pain 9/10 hip pain 8/10 Referrals GO TO FACILITY OTHER - SPECIFY

## 2020-03-29 NOTE — Telephone Encounter (Signed)
Noted  

## 2020-04-02 ENCOUNTER — Encounter (HOSPITAL_BASED_OUTPATIENT_CLINIC_OR_DEPARTMENT_OTHER): Payer: Self-pay | Admitting: Emergency Medicine

## 2020-04-02 ENCOUNTER — Emergency Department (HOSPITAL_BASED_OUTPATIENT_CLINIC_OR_DEPARTMENT_OTHER): Payer: Medicare Other

## 2020-04-02 ENCOUNTER — Emergency Department (HOSPITAL_BASED_OUTPATIENT_CLINIC_OR_DEPARTMENT_OTHER)
Admission: EM | Admit: 2020-04-02 | Discharge: 2020-04-02 | Disposition: A | Discharge status: Home / Self Care | Payer: Medicare Other | Attending: Emergency Medicine | Admitting: Emergency Medicine

## 2020-04-02 ENCOUNTER — Other Ambulatory Visit: Payer: Self-pay

## 2020-04-02 DIAGNOSIS — S0990XA Unspecified injury of head, initial encounter: Secondary | ICD-10-CM | POA: Diagnosis not present

## 2020-04-02 DIAGNOSIS — Z87891 Personal history of nicotine dependence: Secondary | ICD-10-CM | POA: Insufficient documentation

## 2020-04-02 DIAGNOSIS — E785 Hyperlipidemia, unspecified: Secondary | ICD-10-CM | POA: Diagnosis not present

## 2020-04-02 DIAGNOSIS — Z86711 Personal history of pulmonary embolism: Secondary | ICD-10-CM | POA: Insufficient documentation

## 2020-04-02 DIAGNOSIS — W01190A Fall on same level from slipping, tripping and stumbling with subsequent striking against furniture, initial encounter: Secondary | ICD-10-CM | POA: Insufficient documentation

## 2020-04-02 DIAGNOSIS — I10 Essential (primary) hypertension: Secondary | ICD-10-CM | POA: Insufficient documentation

## 2020-04-02 DIAGNOSIS — S199XXA Unspecified injury of neck, initial encounter: Secondary | ICD-10-CM | POA: Diagnosis not present

## 2020-04-02 DIAGNOSIS — Y999 Unspecified external cause status: Secondary | ICD-10-CM | POA: Diagnosis not present

## 2020-04-02 DIAGNOSIS — S301XXA Contusion of abdominal wall, initial encounter: Secondary | ICD-10-CM | POA: Insufficient documentation

## 2020-04-02 DIAGNOSIS — Z79899 Other long term (current) drug therapy: Secondary | ICD-10-CM | POA: Insufficient documentation

## 2020-04-02 DIAGNOSIS — W19XXXA Unspecified fall, initial encounter: Secondary | ICD-10-CM

## 2020-04-02 DIAGNOSIS — R200 Anesthesia of skin: Secondary | ICD-10-CM | POA: Diagnosis not present

## 2020-04-02 DIAGNOSIS — Y939 Activity, unspecified: Secondary | ICD-10-CM | POA: Insufficient documentation

## 2020-04-02 DIAGNOSIS — S299XXA Unspecified injury of thorax, initial encounter: Secondary | ICD-10-CM | POA: Diagnosis not present

## 2020-04-02 DIAGNOSIS — I2510 Atherosclerotic heart disease of native coronary artery without angina pectoris: Secondary | ICD-10-CM | POA: Diagnosis not present

## 2020-04-02 DIAGNOSIS — Z7901 Long term (current) use of anticoagulants: Secondary | ICD-10-CM | POA: Diagnosis not present

## 2020-04-02 DIAGNOSIS — M542 Cervicalgia: Secondary | ICD-10-CM | POA: Diagnosis not present

## 2020-04-02 DIAGNOSIS — R202 Paresthesia of skin: Secondary | ICD-10-CM | POA: Diagnosis not present

## 2020-04-02 DIAGNOSIS — Y929 Unspecified place or not applicable: Secondary | ICD-10-CM | POA: Diagnosis not present

## 2020-04-02 DIAGNOSIS — S3991XA Unspecified injury of abdomen, initial encounter: Secondary | ICD-10-CM | POA: Diagnosis not present

## 2020-04-02 LAB — CBC WITH DIFFERENTIAL/PLATELET
Abs Immature Granulocytes: 0.04 K/uL (ref 0.00–0.07)
Basophils Absolute: 0.1 K/uL (ref 0.0–0.1)
Basophils Relative: 1 %
Eosinophils Absolute: 0.1 K/uL (ref 0.0–0.5)
Eosinophils Relative: 1 %
HCT: 39.8 % (ref 39.0–52.0)
Hemoglobin: 12.7 g/dL — ABNORMAL LOW (ref 13.0–17.0)
Immature Granulocytes: 1 %
Lymphocytes Relative: 15 %
Lymphs Abs: 1.3 K/uL (ref 0.7–4.0)
MCH: 28.1 pg (ref 26.0–34.0)
MCHC: 31.9 g/dL (ref 30.0–36.0)
MCV: 88.1 fL (ref 80.0–100.0)
Monocytes Absolute: 0.6 K/uL (ref 0.1–1.0)
Monocytes Relative: 7 %
Neutro Abs: 6.5 K/uL (ref 1.7–7.7)
Neutrophils Relative %: 75 %
Platelets: 197 K/uL (ref 150–400)
RBC: 4.52 MIL/uL (ref 4.22–5.81)
RDW: 12.5 % (ref 11.5–15.5)
WBC: 8.5 K/uL (ref 4.0–10.5)
nRBC: 0 % (ref 0.0–0.2)

## 2020-04-02 LAB — COMPREHENSIVE METABOLIC PANEL WITH GFR
ALT: 23 U/L (ref 0–44)
AST: 23 U/L (ref 15–41)
Albumin: 3.5 g/dL (ref 3.5–5.0)
Alkaline Phosphatase: 64 U/L (ref 38–126)
Anion gap: 11 (ref 5–15)
BUN: 27 mg/dL — ABNORMAL HIGH (ref 8–23)
CO2: 28 mmol/L (ref 22–32)
Calcium: 8.8 mg/dL — ABNORMAL LOW (ref 8.9–10.3)
Chloride: 101 mmol/L (ref 98–111)
Creatinine, Ser: 1.92 mg/dL — ABNORMAL HIGH (ref 0.61–1.24)
GFR calc Af Amer: 39 mL/min — ABNORMAL LOW
GFR calc non Af Amer: 34 mL/min — ABNORMAL LOW
Glucose, Bld: 112 mg/dL — ABNORMAL HIGH (ref 70–99)
Potassium: 3.7 mmol/L (ref 3.5–5.1)
Sodium: 140 mmol/L (ref 135–145)
Total Bilirubin: 0.5 mg/dL (ref 0.3–1.2)
Total Protein: 6.2 g/dL — ABNORMAL LOW (ref 6.5–8.1)

## 2020-04-02 LAB — CBG MONITORING, ED: Glucose-Capillary: 105 mg/dL — ABNORMAL HIGH (ref 70–99)

## 2020-04-02 MED ORDER — SODIUM CHLORIDE 0.9 % IV BOLUS
500.0000 mL | Freq: Once | INTRAVENOUS | Status: AC
  Administered 2020-04-02: 500 mL via INTRAVENOUS

## 2020-04-02 MED ORDER — ACETAMINOPHEN 325 MG PO TABS
650.0000 mg | ORAL_TABLET | Freq: Once | ORAL | Status: AC
  Administered 2020-04-02: 650 mg via ORAL
  Filled 2020-04-02: qty 2

## 2020-04-02 MED ORDER — IOHEXOL 300 MG/ML  SOLN
100.0000 mL | Freq: Once | INTRAMUSCULAR | Status: AC | PRN
  Administered 2020-04-02: 75 mL via INTRAVENOUS

## 2020-04-02 NOTE — ED Notes (Signed)
Patient transported to CT 

## 2020-04-02 NOTE — ED Triage Notes (Addendum)
Pt here after mechanical fall hitting left side of head. Has abrasion to left side of neck. PT ON BLOOD THINNER.

## 2020-04-02 NOTE — Discharge Instructions (Addendum)
As discussed, your scans were negative for any acute abnormalities.  Your labs showed slightly worsening renal function.  Please follow-up with PCP within the next week for further evaluation of renal function.  You may take over-the-counter Tylenol as needed for pain.  Return to the ER for new or worsening symptoms.

## 2020-04-02 NOTE — ED Provider Notes (Signed)
[facility] EMERGENCY DEPARTMENT Provider Note   CSN: [id number] Arrival date & time: 04/02/20  1329     History Chief Complaint  Patient presents with  . Fall  . Headache  . Neck Injury    Bruce Ball is a 74 y.o. male with a past medical history significant for CAD history of PE on chronic Eliquis, hypertension, history of PUD, and GERD who presents to the ED after a fall that occurred just prior to arrival.  Patient admits to hitting the left side of his head on a chest of drawers before falling directly to the floor.  Patient is currently on Eliquis.  Denies loss of consciousness.  Patient notes he fell because he has been having chronic lower extremity numbness/tingling.  Wife is at bedside and provided some of the history.  Patient has been worked up by numerous providers for numbness and tingling with an unknown cause.  Patient has had 3 falls since February this is a second in the past 2 weeks.  Patient denies headache, visual changes, nausea, vomiting.  Patient also admits to left-sided rib pain and lower left abdominal pain that started directly after the fall.  Denies hip pain, back pain, neck pain.    History obtained from patient and past medical records. No interpreter used during encounter.      Past Medical History:  Diagnosis Date  . CAD (coronary artery disease)   . Chronic back pain greater than 3 months duration   . Chronic leg pain   . Deafness in right ear   . Dyspnea    cause by PE  . Extrinsic asthma, unspecified 04/07/2009   PT DENIES  . GERD (gastroesophageal reflux disease)   . History of kidney stones   . HYPERTENSION 10/29/2007  . HYPOTENSION 08/13/2010  . LOW BACK PAIN 04/21/2008  . NEPHROLITHIASIS, HX OF 04/21/2008  . Pneumonia   . PUD, HX OF 04/21/2008  . Pulmonary emboli ([place]) 2021   noted on CT 12-2019  . Renal disorder     Patient Active Problem List   Diagnosis Date Noted  . Idiopathic neuropathy 01/28/2020  . Recurrent falls  01/28/2020  . Chronic kidney disease (CKD), stage III (moderate) 01/19/2020  . Pulmonary emboli ([place]) 01/14/2020  . GERD (gastroesophageal reflux disease) 12/22/2019  . Essential tremor 12/22/2019  . Acute pain of right knee 07/15/2019  . Gross hematuria 06/14/2019  . Hyperlipidemia 11/11/2017  . Community acquired pneumonia of right upper lobe of lung 11/05/2017  . Right ureteral stone 11/05/2017  . History of total right hip replacement 03/08/2017  . History of prostate cancer 03/08/2017  . 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 High risk medication use 02/28/2017  . DDD (degenerative disc disease), thoracic 02/28/2017  . Ataxia 05/18/2016  . Vestibular disequilibrium 05/18/2016  . Ankylosing spondylitis ([place]) 05/24/2014  . Lung nodule, solitary 05/24/2014  . Peripheral edema 06/08/2012  . Extrinsic asthma 04/07/2009  . LOW BACK PAIN 04/21/2008  . History of peptic ulcer disease 04/21/2008  . Essential hypertension 10/29/2007    Past Surgical History:  Procedure Laterality Date  . BACK SURGERY    . bone cancer  1970   rt femur removed and steel rod placed  . CYSTOSCOPY WITH RETROGRADE PYELOGRAM, URETEROSCOPY AND STENT PLACEMENT Right 11/06/2017   Procedure: CYSTOSCOPY WITH  STENT PLACEMENT RIGHT;  Surgeon: Raynelle Bring, MD;  Location: [facility];  Service: Urology;  Laterality: Right;  . CYSTOSCOPY WITH RETROGRADE PYELOGRAM, URETEROSCOPY AND STENT PLACEMENT Left  06/09/2019   Procedure: CYSTOSCOPY WITH RETROGRADE PYELOGRAM, URETEROSCOPY AND STENT PLACEMENT X2;  Surgeon: [name], MD;  Location: [facility];  Service: Urology;  Laterality: Left;  . CYSTOSCOPY WITH RETROGRADE PYELOGRAM, URETEROSCOPY AND STENT PLACEMENT Bilateral 03/01/2020   Procedure: CYSTOSCOPY WITH RETROGRADE PYELOGRAM, URETEROSCOPY AND STENT PLACEMENT;  Surgeon: [name], MD;  Location: [facility];  Service: Urology;  Laterality: Bilateral;  75 MINS  . CYSTOSCOPY/RETROGRADE/URETEROSCOPY     1 year ago at [place] in  [place]  . CYSTOSCOPY/URETEROSCOPY/HOLMIUM LASER Right 11/24/2017   Procedure: CYSTOSCOPY/URETEROSCOPY/ RETROGRADE/STENT REMOVAL;  Surgeon: Raynelle Bring, MD;  Location: [facility];  Service: Urology;  Laterality: Right;  . HOLMIUM LASER APPLICATION Bilateral [id number]   Procedure: HOLMIUM LASER APPLICATION;  Surgeon: [name], MD;  Location: [facility];  Service: Urology;  Laterality: Bilateral;  . prosatectomy         Family History  Problem Relation Age of Onset  . COPD Father   . Cancer Other        prostate  . Prostate cancer Brother   . Esophageal cancer Neg Hx   . Stomach cancer Neg Hx   . Pancreatic cancer Neg Hx   . Colon cancer Neg Hx     Social History   Tobacco Use  . Smoking status: Former Smoker    Packs/day: 1.00    Years: 17.00    Pack years: 17.00    Start date: 1963    Quit date: 12/30/1978    Years since quitting: 41.2  . Smokeless tobacco: Never Used  Substance Use Topics  . Alcohol use: No  . Drug use: No    Home Medications Prior to Admission medications   Medication Sig Start Date End Date Taking? Authorizing Provider  amLODipine (NORVASC) 5 MG tablet Take 5 mg by mouth at bedtime.     [provider]  apixaban (ELIQUIS) 5 MG TABS tablet Take 2 tablets (10mg ) twice daily for 7 days, then 1 tablet (5mg ) twice daily Patient taking differently: Take 5 mg by mouth 2 (two) times daily.  01/16/20   Georgette Shell, MD  atorvastatin (LIPITOR) 20 MG tablet Take 20 mg at bedtime by mouth.     [provider]  carvedilol (COREG) 25 MG tablet Take 25 mg 2 (two) times daily by mouth.     [provider]  cephALEXin (KEFLEX) 500 MG capsule Take 1 capsule (500 mg total) by mouth 2 (two) times daily. X 3 days to prevent infection with tethered stents in place 03/01/20   [name], MD  Cholecalciferol (VITAMIN D3) 50 MCG (2000 UT) capsule Take 2,000 Units by mouth in the morning and at bedtime.     [provider]   HYDROmorphone (DILAUDID) 2 MG tablet Take 1 tablet (2 mg total) by mouth every 6 (six) hours as needed for severe pain (post-operatively). 03/01/20   [name], MD  isosorbide mononitrate (IMDUR) 30 MG 24 hr tablet Take 30 mg by mouth daily.     [provider]  ketorolac (TORADOL) 10 MG tablet Take 1 tablet (10 mg total) by mouth every 8 (eight) hours as needed for moderate pain. Or stent discomfort post-operatively 03/01/20   [name], MD  metoCLOPramide (REGLAN) 5 MG tablet Take 1 tablet (5 mg total) by mouth 3 (three) times daily before meals. 02/24/20   Armbruster, Carlota Raspberry, MD  omeprazole (PRILOSEC) 20 MG capsule Take 40 mg by mouth 2 (two) times daily.    [provider]  predniSONE (DELTASONE) 5 MG tablet Take 5 mg by mouth daily with breakfast.    [provider]  tamsulosin (FLOMAX) 0.4 MG CAPS capsule Take 0.4 mg by mouth daily. 02/14/20   [provider]    Allergies    Patient has no known allergies.  Review of Systems   Review of Systems  Eyes: Negative for visual disturbance.  Respiratory: Negative for shortness of breath.   Cardiovascular: Positive for chest pain (left sided rib pain).  Gastrointestinal: Positive for abdominal pain. Negative for diarrhea, nausea and vomiting.  Musculoskeletal: Negative for back pain and neck pain.  Skin: Positive for color change and wound.  Neurological: Positive for numbness (chronic). Negative for dizziness, weakness and headaches.  All other systems reviewed and are negative.   Physical Exam Updated Vital Signs BP (!) 152/71 (BP Location: Right Arm)   Pulse 69   Temp 98.5 F (36.9 C) (Oral)   Resp 16   SpO2 99%   Physical Exam Vitals and nursing note reviewed.  Constitutional:      General: He is not in acute distress.    Appearance: He is not ill-appearing.  HENT:     Head: Normocephalic.     Comments: No raccoon eyes, battle sign, hemotympanum, or septal hematomas. Eyes:      Pupils: Pupils are equal, round, and reactive to light.  Neck:     Comments: No cervical midline tenderness. Cardiovascular:     Rate and Rhythm: Normal rate and regular rhythm.     Pulses: Normal pulses.     Heart sounds: Normal heart sounds. No murmur. No friction rub. No gallop.   Pulmonary:     Effort: Pulmonary effort is normal.     Breath sounds: Normal breath sounds.  Chest:     Comments: Tenderness palpation over left side chest wall.  No deformity or crepitus. Abdominal:     General: Abdomen is flat. Bowel sounds are normal. There is no distension.     Palpations: Abdomen is soft.     Tenderness: There is abdominal tenderness. There is no guarding or rebound.     Comments: Tenderness throughout left side of abdomen with overlying ecchymosis in the splenic region.  Musculoskeletal:     Cervical back: Neck supple.     Comments: No T-spine and L-spine midline tenderness, no stepoff or deformity, no paraspinal tenderness No leg edema bilaterally Patient moves all extremities without difficulty. DP/PT pulses 2+ and equal bilaterally Sensation grossly intact bilaterally Strength of knee flexion and extension is 5/5 Plantar and dorsiflexion of ankle 5/5   Skin:    Findings: Bruising and lesion present.  Neurological:     General: No focal deficit present.     Mental Status: He is alert.  Psychiatric:        Mood and Affect: Mood normal.        Behavior: Behavior normal.     ED Results / Procedures / Treatments   Labs (all labs ordered are listed, but only abnormal results are displayed) Labs Reviewed  COMPREHENSIVE METABOLIC PANEL - Abnormal; Notable for the following components:      Result Value   Glucose, Bld 112 (*)    BUN 27 (*)    Creatinine, Ser 1.92 (*)    Calcium 8.8 (*)    Total Protein 6.2 (*)    GFR calc non Af Amer 34 (*)    GFR calc Af Amer 39 (*)    All other components within normal limits  CBC WITH DIFFERENTIAL/PLATELET - Abnormal; Notable for  the following components:   Hemoglobin 12.7 (*)    All other components within normal limits  CBG MONITORING, ED - Abnormal; Notable for the following components:   Glucose-Capillary 105 (*)    All other components within normal limits    EKG None  Radiology CT Head Wo Contrast  Result Date: 04/02/2020 CLINICAL DATA:  Head trauma. EXAM: CT HEAD WITHOUT CONTRAST TECHNIQUE: Contiguous axial images were obtained from the base of the skull through the vertex without intravenous contrast. COMPARISON:  November 16, 2019 FINDINGS: Brain: No evidence of acute infarction, hemorrhage, hydrocephalus, extra-axial collection or mass lesion/mass effect. Moderate brain parenchymal atrophy and periventricular microangiopathy. Vascular: Calcific atherosclerotic disease of the intra cavernous carotid arteries. Skull: Normal. Negative for fracture or focal lesion. Sinuses/Orbits: Normal appearance of the lipids. Mild mucosal thickening of the ethmoid left maxillary and right sphenoid sinuses. Other: None. IMPRESSION: 1. No acute intracranial abnormality. 2. Atrophy, chronic microvascular disease. Electronically Signed   By: Fidela Salisbury M.D.   On: 04/02/2020 14:32   CT Chest W Contrast  Result Date: 04/02/2020 CLINICAL DATA:  Fall, left-sided injuries EXAM: CT CHEST, ABDOMEN, AND PELVIS WITH CONTRAST TECHNIQUE: Multidetector CT imaging of the chest, abdomen and pelvis was performed following the standard protocol during bolus administration of intravenous contrast. CONTRAST:  67mL OMNIPAQUE IOHEXOL 300 MG/ML  SOLN COMPARISON:  CT abdomen pelvis, 02/14/2020 FINDINGS: CT CHEST FINDINGS Cardiovascular: Aortic atherosclerosis. Aortic valve calcifications. Normal heart size. Three-vessel coronary artery calcifications. No pericardial effusion. Mediastinum/Nodes: No enlarged mediastinal, hilar, or axillary lymph nodes. Thyroid gland, trachea, and esophagus demonstrate no significant findings. Lungs/Pleura: Lungs are  clear. No pleural effusion or pneumothorax. Musculoskeletal: No chest wall mass or suspicious bone lesions identified. CT ABDOMEN PELVIS FINDINGS Hepatobiliary: No solid liver abnormality is seen. Numerous low-attenuation lesions of the liver, the largest of which are simple cysts, numerous others too small to characterize although likely simple cysts and unchanged compared to prior examinations. No gallstones, gallbladder wall thickening, or biliary dilatation. Pancreas: Unremarkable. No pancreatic ductal dilatation or surrounding inflammatory changes. Spleen: Normal in size without significant abnormality. Adrenals/Urinary Tract: Adrenal glands are unremarkable. Mildly atrophic kidneys with numerous bilateral renal calculi. Bladder is unremarkable. Stomach/Bowel: Stomach is within normal limits. Appendix appears normal. No evidence of bowel wall thickening, distention, or inflammatory changes. Vascular/Lymphatic: Aortic atherosclerosis. 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 No enlarged abdominal or pelvic lymph nodes. Reproductive: No mass or other abnormality. Other: No abdominal wall hernia or abnormality. No abdominopelvic ascites. Musculoskeletal: Osteopenia. There is new sclerosis of the right sacral ala, likely a subacute insufficiency fracture (series 3, image 756) IMPRESSION: 1. No CT evidence of acute traumatic injury to the chest, abdomen, or pelvis. 2. There is new sclerosis of the right sacral ala when compared to prior examination dated 02/14/2020, likely a subacute insufficiency fracture. 3.  Osteopenia. 4. Aortic Atherosclerosis (ICD10-I70.0). 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 5.  Extensive bilateral nonobstructive nephrolithiasis. Electronically Signed   By: Eddie Candle M.D.   On: 04/02/2020 16:22   CT Cervical Spine Wo Contrast  Result  Date: 04/02/2020 CLINICAL DATA:  Head trauma. Neck and shoulder pain. EXAM: CT CERVICAL SPINE WITHOUT CONTRAST TECHNIQUE: Multidetector CT imaging of the cervical spine was performed without intravenous contrast. Multiplanar CT image reconstructions were also generated. COMPARISON:  None. FINDINGS: Alignment: Normal.  Skull base and vertebrae: No acute fracture. No primary bone lesion or focal pathologic process. Soft tissues and spinal canal: No prevertebral fluid or swelling. No visible canal hematoma. Disc levels:  Mild multilevel osteoarthritic changes. Upper chest: Negative. Other: None. IMPRESSION: 1. No evidence of acute traumatic injury to the cervical spine. 2. Mild multilevel osteoarthritic changes of the cervical spine. Electronically Signed   By: Fidela Salisbury M.D.   On: 04/02/2020 14:35   CT ABDOMEN PELVIS W CONTRAST  Result Date: 04/02/2020 CLINICAL DATA:  Fall, left-sided injuries EXAM: CT CHEST, ABDOMEN, AND PELVIS WITH CONTRAST TECHNIQUE: Multidetector CT imaging of the chest, abdomen and pelvis was performed following the standard protocol during bolus administration of intravenous contrast. CONTRAST:  41mL OMNIPAQUE IOHEXOL 300 MG/ML  SOLN COMPARISON:  CT abdomen pelvis, 02/14/2020 FINDINGS: CT CHEST FINDINGS Cardiovascular: Aortic atherosclerosis. Aortic valve calcifications. Normal heart size. Three-vessel coronary artery calcifications. No pericardial effusion. Mediastinum/Nodes: No enlarged mediastinal, hilar, or axillary lymph nodes. Thyroid gland, trachea, and esophagus demonstrate no significant findings. Lungs/Pleura: Lungs are clear. No pleural effusion or pneumothorax. Musculoskeletal: No chest wall mass or suspicious bone lesions identified. CT ABDOMEN PELVIS FINDINGS Hepatobiliary: No solid liver abnormality is seen. Numerous low-attenuation lesions of the liver, the largest of which are simple cysts, numerous others too small to characterize although likely simple cysts and  unchanged compared to prior examinations. No gallstones, gallbladder wall thickening, or biliary dilatation. Pancreas: Unremarkable. No pancreatic ductal dilatation or surrounding inflammatory changes. Spleen: Normal in size without significant abnormality. Adrenals/Urinary Tract: Adrenal glands are unremarkable. Mildly atrophic kidneys with numerous bilateral renal calculi. Bladder is unremarkable. Stomach/Bowel: Stomach is within normal limits. Appendix appears normal. No evidence of bowel wall thickening, distention, or inflammatory changes. Vascular/Lymphatic: Aortic atherosclerosis. 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 No enlarged abdominal or pelvic lymph nodes. Reproductive: No mass or other abnormality. Other: No abdominal wall hernia or abnormality. No abdominopelvic ascites. Musculoskeletal: Osteopenia. There is new sclerosis of the right sacral ala, likely a subacute insufficiency fracture (series 3, image 756) IMPRESSION: 1. No CT evidence of acute traumatic injury to the chest, abdomen, or pelvis. 2. There is new sclerosis of the right sacral ala when compared to prior examination dated 02/14/2020, likely a subacute insufficiency fracture. 3.  Osteopenia. 4. Aortic Atherosclerosis (ICD10-I70.0). 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 5.  Extensive bilateral nonobstructive nephrolithiasis. Electronically Signed   By: Eddie Candle M.D.   On: 04/02/2020 16:22    Procedures Procedures (including critical care time)  Medications Ordered in ED Medications  acetaminophen (TYLENOL) tablet 650 mg (650 mg Oral Given 04/02/20 1433)  sodium chloride 0.9 % bolus 500 mL (500 mLs Intravenous New Bag/Given 04/02/20 1604)  iohexol (OMNIPAQUE) 300 MG/ML solution 100 mL (75 mLs Intravenous Contrast Given 04/02/20 1527)    ED Course  I have reviewed the triage vital  signs and the nursing notes.  Pertinent labs & imaging results that were available during my care of the patient were reviewed by me and considered in my medical decision making (see chart for details).  Clinical Course as of Apr 02 1632  Nancy Fetter Apr 02, 2020  1546 Creatinine(!): 1.92 [CA]  1604 BUN(!): 27 [CA]    Clinical Course User Index [CA] Suzy Bouchard, PA-C   MDM Rules/Calculators/A&P  74 year old male presents to the ED after a fall that occurred just prior to arrival in which she hit the left side of his head.  Patient is currently on Eliquis.  Patient has chronic lower extremity numbness/tingling which she has been worked up by numerous providers without a known cause.  Stable vitals.  Patient no acute distress and nontoxic-appearing.  Physical exam reassuring.  Normal neurological exam.  Ecchymosis in upper left abdomen with tenderness palpation.  Tenderness over left-sided chest wall.  Will obtain routine labs, CT head/neck/chest/abdomen to rule out any acute abnormalities.  Discussed case with Dr. Tamera Punt who evaluated patient at bedside and agrees with assessment and plan.  Tylenol given for pain per her request from patient.  CBC reassuring with no leukocytosis and slight decreased hemoglobin from baseline at 12.7.  CMP significant for worsening renal function with creatinine at 1.92 BUN at 27. Will give 500L bolus.  CT head and C-spine personally reviewed which is negative for any acute abnormalities. CT Chest/Abdomen personally reviewed which demonstrates: IMPRESSION:  1. No CT evidence of acute traumatic injury to the chest, abdomen,  or pelvis.    2. There is new sclerosis of the right sacral ala when compared to  prior examination dated 02/14/2020, likely a subacute insufficiency  fracture.    3. Osteopenia.    4. Aortic Atherosclerosis (ICD10-I70.0). Unchanged focal aneurysm  from the anterior aspect of the left common iliac artery measuring   approximately 1.0 cm.    5. Extensive bilateral nonobstructive nephrolithiasis.   Instructed patient to take over the counter tylenol as needed for pain relief. Advised patient to follow-up with PCP within the next week for further evaluation. Strict ED precautions discussed with patient. Patient states understanding and agrees to plan. Patient discharged home in no acute distress and stable vitals.   Final Clinical Impression(s) / ED Diagnoses Final diagnoses:  Fall, initial encounter  Injury of head, initial encounter    Rx / DC Orders ED Discharge Orders    None       Suzy Bouchard, PA-C 04/02/20 1634    Malvin Johns, MD 04/06/20 [phone]

## 2020-04-04 ENCOUNTER — Encounter: Payer: Self-pay | Admitting: Family Medicine

## 2020-04-04 ENCOUNTER — Other Ambulatory Visit: Payer: Self-pay

## 2020-04-04 ENCOUNTER — Ambulatory Visit (INDEPENDENT_AMBULATORY_CARE_PROVIDER_SITE_OTHER): Payer: Medicare Other | Admitting: Family Medicine

## 2020-04-04 VITALS — BP 132/78 | HR 73 | Temp 98.7°F | Ht 71.0 in | Wt 230.8 lb

## 2020-04-04 DIAGNOSIS — M8080XA Other osteoporosis with current pathological fracture, unspecified site, initial encounter for fracture: Secondary | ICD-10-CM

## 2020-04-04 DIAGNOSIS — I10 Essential (primary) hypertension: Secondary | ICD-10-CM

## 2020-04-04 DIAGNOSIS — G629 Polyneuropathy, unspecified: Secondary | ICD-10-CM

## 2020-04-04 DIAGNOSIS — R29898 Other symptoms and signs involving the musculoskeletal system: Secondary | ICD-10-CM

## 2020-04-04 DIAGNOSIS — M810 Age-related osteoporosis without current pathological fracture: Secondary | ICD-10-CM | POA: Insufficient documentation

## 2020-04-04 NOTE — Assessment & Plan Note (Signed)
[facility]-sounds like second dose early 2021.  Sacral insufficiency fracture likely related to this-this was likely the cause of prior right hip pain-seems to be doing better at this point

## 2020-04-04 NOTE — Progress Notes (Signed)
Kindly inform the patient that EMG nerve conduction study confirms mild peripheral neuropathy in both lower extremities.  Previously lab work for reversible causes of neuropathy was all normal

## 2020-04-04 NOTE — Progress Notes (Signed)
Phone [phone] In person visit   Subjective:   Bruce Ball is a 74 y.o. year old very pleasant male patient who presents for/with See problem oriented charting Chief Complaint  Patient presents with  . Hip Pain   This visit occurred during the SARS-CoV-2 public health emergency.  Safety protocols were in place, including screening questions prior to the visit, additional usage of staff PPE, and extensive cleaning of exam room while observing appropriate contact time as indicated for disinfecting solutions.   Past Medical History-  Patient Active Problem List   Diagnosis Date Noted  . Idiopathic neuropathy 01/28/2020    Priority: High  . Recurrent falls 01/28/2020    Priority: High  . Pulmonary emboli ([place]) 01/14/2020    Priority: High  . High risk medication use 02/28/2017    Priority: High  . Ataxia 05/18/2016    Priority: High  . Vestibular disequilibrium 05/18/2016    Priority: High  . Ankylosing spondylitis ([place]) 05/24/2014    Priority: High  . Chronic kidney disease (CKD), stage III (moderate) 01/19/2020    Priority: Medium  . GERD (gastroesophageal reflux disease) 12/22/2019    Priority: Medium  . Essential tremor 12/22/2019    Priority: Medium  . Hyperlipidemia 11/11/2017    Priority: Medium  . Right ureteral stone 11/05/2017    Priority: Medium  . History of prostate cancer 03/08/2017    Priority: Medium  . Hx of chondrosarcoma 02/28/2017    Priority: Medium  . Extrinsic asthma 04/07/2009    Priority: Medium  . LOW BACK PAIN 04/21/2008    Priority: Medium  . History of peptic ulcer disease 04/21/2008    Priority: Medium  . Essential hypertension 10/29/2007    Priority: Medium  . Acute pain of right knee 07/15/2019    Priority: Low  . Community acquired pneumonia of right upper lobe of lung 11/05/2017    Priority: Low  . History of total right hip replacement 03/08/2017    Priority: Low  . DDD (degenerative disc disease), thoracic 02/28/2017     Priority: Low  . Peripheral edema 06/08/2012    Priority: Low  . Osteoporosis 04/04/2020  . Gross hematuria 06/14/2019  . Lung nodule, solitary 05/24/2014    Medications- reviewed and updated Current Outpatient Medications  Medication Sig Dispense Refill  . amLODipine (NORVASC) 5 MG tablet Take 5 mg by mouth at bedtime.     Marland Kitchen apixaban (ELIQUIS) 5 MG TABS tablet Take 2 tablets (10mg ) twice daily for 7 days, then 1 tablet (5mg ) twice daily (Patient taking differently: Take 5 mg by mouth 2 (two) times daily. ) 60 tablet 1  . atorvastatin (LIPITOR) 20 MG tablet Take 20 mg at bedtime by mouth.     . carvedilol (COREG) 25 MG tablet Take 25 mg 2 (two) times daily by mouth.     . Cholecalciferol (VITAMIN D3) 50 MCG (2000 UT) capsule Take 2,000 Units by mouth in the morning and at bedtime.     . isosorbide mononitrate (IMDUR) 30 MG 24 hr tablet Take 30 mg by mouth daily.     . metoCLOPramide (REGLAN) 5 MG tablet Take 1 tablet (5 mg total) by mouth 3 (three) times daily before meals. 90 tablet 1  . omeprazole (PRILOSEC) 20 MG capsule Take 40 mg by mouth 2 (two) times daily.    . predniSONE (DELTASONE) 5 MG tablet Take 5 mg by mouth daily with breakfast.    . indapamide (LOZOL) 1.25 MG tablet Take 1.25 mg by  mouth daily.     No current facility-administered medications for this visit.     Objective:  BP 132/78   Pulse 73   Temp 98.7 F (37.1 C) (Temporal)   Ht 5\' 11"  (1.803 m)   Wt 230 lb 12.8 oz (104.7 kg)   SpO2 95%   BMI 32.19 kg/m  Gen: NAD, resting comfortably CV: RRR no murmurs rubs or gallops Lungs: CTAB no crackles, wheeze, rhonchi Abdomen: Extensive bruising over left upper quadrant-patient with pinpoint pain over handbreadth below this.  No rebounding or guarding Ext: trace edema- very tender over his shins Skin: warm, dry MSK: Patient with tenderness to palpation over left low back. Neuro: in wheelchair today.  4/5 strength in left lower extremity--patient reports this  is his baseline.  5 out of 5 strength right lower extremity     Assessment and Plan  # Right Hip Pain after fall # Recurrent falls and recent fall 04/02/20 leading to ED visit with imbalance nad leg weakness S:seen by Dr. Elease Hashimoto on 03-29-2020. Hip pain after recent fall. Xray were obtained at that visit- stable hip replacement essentially. Patient has a bruise the left side of his upper abdomen. He is still sore from his previous fall on the right side.   Patient then seen for a fall on 04/02/20 in emrgency room- patient with  1. CT head- no acute intracranial abnormality.  Some atrophy and chronic microvascular disease noted (hit head on marble countertop) 2. Ct cervical spine -no acute traumatic abnormality to cervical spine.  Multilevel arthritic changes noted 3. Ct chest w contrast as well as abdomen pelvis-no acute evidence of traumatic injury to chest, abdomen, pelvis.   -Patient did have sclerosis of right sacral ala which was new compared to February CT concerning for subacute insufficiency fracture  He continues to have some left lower quadrant pain below area of significant bruising in LUQ after fall on 04/02/20- he was having this pain when had the CT. Pain is rather superficial but it does seem somewhat worse.   He was still sore from fall on the right side but honestly low back pain seems to be back and forth on each side and not as focused in left hip. Pain below right knee down into her lower leg is the most significant pain at present- occasionally into left lower leg. Also hurts in back- at moment more on left.   Patient's main concern is recurrent falls and leg weakness.  Patient had an EMG/nerve conduction test with Dr. Jannifer Franklin on March 13, 2020.  Per Dr. Clydene Fake notes "Kindly inform the patient that EMG nerve conduction study confirms mild peripheral neuropathy in both lower extremities. Previously lab work for reversible causes of neuropathy was all normal".  Patient states he was  told by neurology that he needed to see neurosurgery and he is scheduled to see them on April 13-neurology recommended that neurosurgery order MRI.  When patient discussed with neurosurgery they recommended this be ordered by primary care before the visit A/P: I am very concerned about patient's recurrent falls.  This may all be neuropathy related but I am concerned it also could be coming from lumbar spine-we ordered updated MRI of lumbar spine today-did with and without contrast due to prior surgery on lumbar spine-I would be okay with this being changed to without contrast if creatinine prevents contrast from being used -Patient using rolling walker with brakes at home-I recommended a 4 pronged walker without rolling wheels as I think this  would decrease fall risk.  I recommend he use his walker at all times. -Pain down the patient's legs may be radicular.  He does have history of DVT but is on Eliquis-doubt worsening DVT  In regards to patient's right hip pain-sacral insufficiency fracture may have been the cause of this after prior fall-treatment this is primarily conservative.  Also patient is already on Reclast through the [place] for his osteoporosis  Patient sustained multiple injuries after fall and has ongoing back pain.  He had Toradol on his list but he is not taking this thankfully with worsening renal function.  He is on Tylenol 5 mg every 3 hours-max 5-6 doses per day still under 3000 mg.  I think this is reasonable choice since it seems to be helping with pain  For worsening renal function-patient received fluid bolus in the emergency department-we will update creatinine today.  Remain off NSAIDs.  He is on a slight diuretic with indapamide to help with kidney stones-we did not stop this at this time   #hypertension S: compliant with amlodipine 5 mg, Imdur 30 mg BP Readings from Last 3 Encounters:  04/04/20 132/78  04/02/20 (!) 152/71  03/29/20 124/66  A/P:  Stable. Continue current  medications.     Recommended follow up: Patient wanted to keep me follow-up given ongoing falls Future Appointments  Date Time Provider [place]  04/06/2020  7:00 AM [facility] [place]  05/04/2020  2:00 PM Garvin Fila, MD [facility] None  05/17/2020 10:40 AM [name], MD [facility]  07/25/2020 10:00 AM [name], MD [facility] None    Lab/Order associations:   ICD-10-CM   1. Weakness of both lower extremities  R29.898 MR Lumbar Spine W Wo Contrast    CANCELED: MR Lumbar Spine W Wo Contrast  2. Neuropathy  G62.9 MR Lumbar Spine W Wo Contrast    CANCELED: MR Lumbar Spine W Wo Contrast  3. Essential hypertension  I10 CBC with Differential/Platelet    Comprehensive metabolic panel  4. Other osteoporosis with current pathological fracture, initial encounter  M80.80XA    Time Spent: 47 minutes of total time (2:49 PM- 3:26 PM, 10:27 PM-10:37 PM) was spent on the date of the encounter performing the following actions: chart review prior to seeing the patient, obtaining history, performing a medically necessary exam, counseling on the treatment plan, placing orders, and documenting in our EHR.   Return precautions advised.  [name], MD

## 2020-04-04 NOTE — Patient Instructions (Addendum)
Health Maintenance Due  Topic Date Due  . PNA vac Low Risk Adult (2 of 2 - PCV13)- likely [facility]_ if you can get copy of vaccines next visit 05/20/2015   Team placed stat referral for MRI- please get plan with [place] on if they need to stay for this or if we will call them  Please stop by lab before you go If you do not have mychart- we will call you about results within 5 business days of [name] receiving them.  If you have mychart- we will send your results within 3 business days of [name] receiving them.  If abnormal or we want to clarify a result, we will call or mychart you to make sure you receive the message.  If you have questions or concerns or don't hear within 5 business days, please send [name] a message or call us.    Recommended follow up: keep may visit

## 2020-04-05 LAB — COMPREHENSIVE METABOLIC PANEL
ALT: 21 U/L (ref 0–53)
AST: 20 U/L (ref 0–37)
Albumin: 3.7 g/dL (ref 3.5–5.2)
Alkaline Phosphatase: 67 U/L (ref 39–117)
BUN: 25 mg/dL — ABNORMAL HIGH (ref 6–23)
CO2: 29 mEq/L (ref 19–32)
Calcium: 8.8 mg/dL (ref 8.4–10.5)
Chloride: 103 mEq/L (ref 96–112)
Creatinine, Ser: 1.74 mg/dL — ABNORMAL HIGH (ref 0.40–1.50)
GFR: 38.57 mL/min — ABNORMAL LOW (ref >60.00)
Glucose, Bld: 136 mg/dL — ABNORMAL HIGH (ref 70–99)
Potassium: 3.3 mEq/L — ABNORMAL LOW (ref 3.5–5.1)
Sodium: 141 mEq/L (ref 135–145)
Total Bilirubin: 0.7 mg/dL (ref 0.2–1.2)
Total Protein: 5.9 g/dL — ABNORMAL LOW (ref 6.0–8.3)

## 2020-04-05 LAB — CBC WITH DIFFERENTIAL/PLATELET
Basophils Absolute: 0.1 10*3/uL (ref 0.0–0.1)
Basophils Relative: 0.7 % (ref 0.0–3.0)
Eosinophils Absolute: 0.1 10*3/uL (ref 0.0–0.7)
Eosinophils Relative: 1.2 % (ref 0.0–5.0)
HCT: 39.3 % (ref 39.0–52.0)
Hemoglobin: 12.6 g/dL — ABNORMAL LOW (ref 13.0–17.0)
Lymphocytes Relative: 23.4 % (ref 12.0–46.0)
Lymphs Abs: 2 10*3/uL (ref 0.7–4.0)
MCHC: 32 g/dL (ref 30.0–36.0)
MCV: 88.7 fl (ref 78.0–100.0)
Monocytes Absolute: 0.7 10*3/uL (ref 0.1–1.0)
Monocytes Relative: 7.9 % (ref 3.0–12.0)
Neutro Abs: 5.6 10*3/uL (ref 1.4–7.7)
Neutrophils Relative %: 66.8 % (ref 43.0–77.0)
Platelets: 214 10*3/uL (ref 150.0–400.0)
RBC: 4.44 Mil/uL (ref 4.22–5.81)
RDW: 13.9 % (ref 11.5–15.5)
WBC: 8.4 10*3/uL (ref 4.0–10.5)

## 2020-04-06 ENCOUNTER — Other Ambulatory Visit: Payer: Self-pay

## 2020-04-06 ENCOUNTER — Ambulatory Visit (HOSPITAL_COMMUNITY)
Admission: RE | Admit: 2020-04-06 | Discharge: 2020-04-06 | Disposition: A | Discharge status: Home / Self Care | Payer: Medicare Other | Source: Ambulatory Visit | Attending: Family Medicine | Admitting: Family Medicine

## 2020-04-06 DIAGNOSIS — R29898 Other symptoms and signs involving the musculoskeletal system: Secondary | ICD-10-CM | POA: Insufficient documentation

## 2020-04-06 DIAGNOSIS — G629 Polyneuropathy, unspecified: Secondary | ICD-10-CM | POA: Diagnosis not present

## 2020-04-06 DIAGNOSIS — S3992XA Unspecified injury of lower back, initial encounter: Secondary | ICD-10-CM | POA: Diagnosis not present

## 2020-04-06 MED ORDER — GADOBUTROL 1 MMOL/ML IV SOLN
10.0000 mL | Freq: Once | INTRAVENOUS | Status: AC | PRN
  Administered 2020-04-06: 10 mL via INTRAVENOUS

## 2020-04-06 NOTE — Progress Notes (Signed)
Notes from Dr. Yong Channel There is a disc protruding pushing on one of the nerve roots-glad you have follow-up with neurosurgery soon.  You are already on prednisone and I wonder if they are going to increase this dose or try alternate therapy.They did see the bone fracture in the sacrum which we previously discussed.  You also have multiple areas of degenerative changes in the low back noted

## 2020-04-11 ENCOUNTER — Encounter: Payer: Self-pay | Admitting: Family Medicine

## 2020-04-11 DIAGNOSIS — M21371 Foot drop, right foot: Secondary | ICD-10-CM | POA: Diagnosis not present

## 2020-04-11 DIAGNOSIS — M47816 Spondylosis without myelopathy or radiculopathy, lumbar region: Secondary | ICD-10-CM | POA: Diagnosis not present

## 2020-04-11 DIAGNOSIS — M5136 Other intervertebral disc degeneration, lumbar region: Secondary | ICD-10-CM | POA: Diagnosis not present

## 2020-04-11 DIAGNOSIS — M5416 Radiculopathy, lumbar region: Secondary | ICD-10-CM | POA: Diagnosis not present

## 2020-04-11 DIAGNOSIS — Z9889 Other specified postprocedural states: Secondary | ICD-10-CM | POA: Diagnosis not present

## 2020-04-11 DIAGNOSIS — M5126 Other intervertebral disc displacement, lumbar region: Secondary | ICD-10-CM | POA: Diagnosis not present

## 2020-04-18 ENCOUNTER — Encounter: Payer: Self-pay | Admitting: Hematology and Oncology

## 2020-04-18 ENCOUNTER — Inpatient Hospital Stay: Payer: Medicare Other | Attending: Hematology and Oncology | Admitting: Hematology and Oncology

## 2020-04-18 ENCOUNTER — Other Ambulatory Visit: Payer: Self-pay

## 2020-04-18 DIAGNOSIS — I2699 Other pulmonary embolism without acute cor pulmonale: Secondary | ICD-10-CM | POA: Diagnosis not present

## 2020-04-18 DIAGNOSIS — R296 Repeated falls: Secondary | ICD-10-CM | POA: Diagnosis not present

## 2020-04-18 DIAGNOSIS — M5441 Lumbago with sciatica, right side: Secondary | ICD-10-CM | POA: Diagnosis not present

## 2020-04-18 NOTE — Assessment & Plan Note (Signed)
I reviewed his recent MRI which show significant degenerative disc disease The patient felt completely debilitated with pain and inability to move due to weakness, causing recurrent falls As above, I have spoken with his neurosurgeon I am awaiting for further phone call from the office to see how I can help to arrange for perioperative anticoagulation management

## 2020-04-18 NOTE — Assessment & Plan Note (Addendum)
He felt that his recurrent falls was caused by his nerve impingement from lumbar disc surgery as seen on MRI The patient is completely debilitated and would like to have back surgery soon as possible From my perspective, his prior diagnosis of blood clot is not a contraindication for him to proceed with surgery if indicated

## 2020-04-18 NOTE — Progress Notes (Signed)
HEMATOLOGY-ONCOLOGY ELECTRONIC VISIT PROGRESS NOTE  Patient Care Team: [name], MD as PCP - General (Family Medicine) [facility], [facility] as Consulting Physician Jovita Gamma, MD as Consulting Physician (Neurosurgery) Clinic, [facility] as Consulting Physician [name], MD as Consulting Physician (Urology)  I connected with by [facility] video conference and verified that I am speaking with the correct person using two identifiers.  I discussed the limitations, risks, security and privacy concerns of performing an evaluation and management service by [facility] and the availability of in person appointments.  I also discussed with the patient that there may be a patient responsible charge related to this service. The patient expressed understanding and agreed to proceed.   ASSESSMENT & PLAN:  Pulmonary emboli ([place]) I have reviewed the recommendation on anticoagulation therapy with the patient and his wife extensively From my last visit, I recommend minimum 6 months to a year of treatment He had CT imaging of the chest with contrast done 2 weeks ago when he had a fall in the emergency room Even though the CT imaging of the chest is not done with angiogram protocol, there is no evidence of blood clot in the imaging He has completed at least 3 months of anticoagulation therapy For perioperative anticoagulation management, he can stop his Eliquis for 48 hours prior to spine surgery without bridging therapy Per patient request, he would like me to relay this information to the neurosurgeon so that he can have his back surgery as soon as possible I spoke with his neurosurgeon, Dr. Sherwood Gambler and he recommended that I speak with Dr. Arnoldo Morale since he will be retiring He will likely need preoperative cardiac clearance either from his primary care doctor or cardiologist before he proceed with neurosurgery I will call him back once I have more information from his  neurosurgeon  Recurrent falls He felt that his recurrent falls was caused by his nerve impingement from lumbar disc surgery as seen on MRI The patient is completely debilitated and would like to have back surgery soon as possible From my perspective, his prior diagnosis of blood clot is not a contraindication for him to proceed with surgery if indicated  LOW BACK PAIN I reviewed his recent MRI which show significant degenerative disc disease The patient felt completely debilitated with pain and inability to move due to weakness, causing recurrent falls As above, I have spoken with his neurosurgeon I am awaiting for further phone call from the office to see how I can help to arrange for perioperative anticoagulation management   No orders of the defined types were placed in this encounter.   INTERVAL HISTORY: Please see below for problem oriented charting. The patient requested this visit to address anticoagulation issue related to his prior diagnosis of PE and future surgery Since last time I saw him, he continues to have recurrent falls due to weakness He has significant back pain His neurosurgeon, Dr. 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 He also have history of prostate cancer status post prostatectomy, in remission without the need  for adjuvant chemotherapy or radiation therapy He is a retired [employer] as a second language teacher who has been exposed to agent orange when he was younger He is here accompanied by his wife, [name]  He was  recently hospitalized between January 14 to January 16, 2020 after presentation with shortness of breath He was found to have left lower extremity DVT and bilateral PE He complained of shortness of breath shortly after EGD dilatation was performed for chronic reflux disease and esophageal stricture  On January 13, 2020, CT angiogram showed Pulmonary emboli in all lobes of both lungs. Evidence of right heart strain (RV/LV Ratio = 1.06) consistent with at least submassive (intermediate risk) PE. The presence of right heart strain has been associated with an increased risk of morbidity and mortality. Coronary artery disease. Aortic Atherosclerosis (ICD10-I70.0).  On January 15, venous Doppler ultrasound showed Right: There is no evidence of deep vein thrombosis in the lower extremity. No cystic structure found in the popliteal fossa.  Left: Findings consistent with acute deep vein thrombosis involving the left peroneal veins. No cystic structure found in the popliteal fossa.   Echocardiogram report showed IMPRESSIONS   1. Left ventricular ejection fraction, by visual estimation, is 60 to 65%. The left ventricle has normal function. There is moderately increased left ventricular hypertrophy.  2. Moderate asymmetric hypertrophy of the basal septum measuring 50mm (posterior wall 51mm)  3. The mitral valve is normal in structure. No evidence of mitral valve regurgitation.  4. The aortic valve is tricuspid. Aortic valve regurgitation is trivial. Mild aortic valve sclerosis without stenosis.  5. The tricuspid valve is grossly normal.  6. The pulmonic valve was not well visualized. Pulmonic valve regurgitation is trivial.  7. Global right ventricle has normal systolic function.The right ventricular size is normal.  8. Left atrial size was normal.  9. Right atrial size was normal. 10. TR signal is inadequate for assessing pulmonary artery systolic pressure  He was anticoagulated and was subsequently  discharged He had history of recurrent falls, the last fall was over a month ago According to the patient, after he landed at the top of the stairs, his legs buckled with some associated dizziness but he did not sustain major injury A few months back, he also tripped and fell in his yard According to the patient, he has pre-existing peripheral neuropathy affecting both toes and the bottom of his feet of unknown etiology His wife stated he walks with a shuffle He had history of B12 deficiency requiring vitamin B12 supplementation but according to the [place] records, he was told that his B12 level has been good and he has stopped receiving B12 injections He has never been formally evaluated for the cause of his falls or causes of neuropathy by a neurologist On review of outside records, he was seen by a neurologist several years ago for headache but he did not recall that visit His mobility is poor He has chronic bilateral lower extremity edema for some time When he presented to the emergency room, he denies calf pain but he does have bilateral anterior shin pain that is tender to touch for some time He has chronic back pain requiring neurosurgery and have undergone extensive treatment including surgery, implantation of stimulator and removal, extensive physical therapy and rehab for chronic back pain He denies recent history of preceding trauma but did have falls Denies long distance travel, recent surgery, or smoking. He had remote history of smoking He had prior surgeries before and never had perioperative thromboembolic events. The  patient had not been exposed on testosterone replacement therapy  No family history of blood clots So far, he tolerated anticoagulation therapy well without bleeding complications.  On 04/02/2020, he had CT chest, abdomen and pelvis  1. No CT evidence of acute traumatic injury to the chest, abdomen, or pelvis. 2. There is new sclerosis of the right sacral ala when  compared to prior examination dated 02/14/2020, likely a subacute insufficiency fracture. 3.  Osteopenia. 4. Aortic Atherosclerosis (ICD10-I70.0). 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 5.  Extensive bilateral nonobstructive nephrolithiasis.   REVIEW OF SYSTEMS:   Constitutional: Denies fevers, chills or abnormal weight loss Eyes: Denies blurriness of vision Ears, nose, mouth, throat, and face: Denies mucositis or sore throat Respiratory: Denies cough, dyspnea or wheezes Cardiovascular: Denies palpitation, chest discomfort Gastrointestinal:  Denies nausea, heartburn or change in bowel habits Skin: Denies abnormal skin rashes Lymphatics: Denies new lymphadenopathy or easy bruising Behavioral/Psych: Mood is stable, no new changes  Extremities: No lower extremity edema All other systems were reviewed with the patient and are negative.  I have reviewed the past medical history, past surgical history, social history and family history with the patient and they are unchanged from previous note.  ALLERGIES:  has No Known Allergies.  MEDICATIONS:  Current Outpatient Medications  Medication Sig Dispense Refill  . amLODipine (NORVASC) 5 MG tablet Take 5 mg by mouth at bedtime.     Marland Kitchen apixaban (ELIQUIS) 5 MG TABS tablet Take 2 tablets (10mg ) twice daily for 7 days, then 1 tablet (5mg ) twice daily (Patient taking differently: Take 5 mg by mouth 2 (two) times daily. ) 60 tablet 1  . atorvastatin (LIPITOR) 20 MG tablet Take 20 mg at bedtime by mouth.     . carvedilol (COREG) 25 MG tablet Take 25 mg 2 (two) times daily by mouth.     . Cholecalciferol (VITAMIN D3) 50 MCG (2000 UT) capsule Take 2,000 Units by mouth in the morning and at bedtime.     . indapamide (LOZOL) 1.25 MG tablet Take 1.25 mg by mouth daily.    . isosorbide mononitrate (IMDUR) 30 MG 24 hr tablet Take 30 mg by mouth daily.     . metoCLOPramide (REGLAN) 5 MG  tablet Take 1 tablet (5 mg total) by mouth 3 (three) times daily before meals. 90 tablet 1  . omeprazole (PRILOSEC) 20 MG capsule Take 40 mg by mouth 2 (two) times daily.    . predniSONE (DELTASONE) 5 MG tablet Take 5 mg by mouth daily with breakfast.     No current facility-administered medications for this visit.    PHYSICAL EXAMINATION: ECOG PERFORMANCE STATUS: 3 - Symptomatic, >50% confined to bed  LABORATORY DATA:  I have reviewed the data as listed CMP Latest Ref Rng & Units 04/04/2020 04/02/2020 02/25/2020  Glucose 70 - 99 mg/dL 136(H) 112(H) 98  BUN 6 - 23 mg/dL 25(H) 27(H) 19  Creatinine 0.40 - 1.50 mg/dL 1.74(H) 1.92(H) 1.35(H)  Sodium 135 - 145 mEq/L 141 140 142  Potassium 3.5 - 5.1 mEq/L 3.3(L) 3.7 3.6  Chloride 96 - 112 mEq/L 103 101 108  CO2 19 - 32 mEq/L 29 28 28   Calcium 8.4 - 10.5 mg/dL 8.8 8.8(L) 8.3(L)  Total Protein 6.0 - 8.3 g/dL 5.9(L) 6.2(L) -  Total Bilirubin 0.2 - 1.2 mg/dL 0.7 0.5 -  Alkaline Phos 39 - 117 U/L 67 64 -  AST 0 - 37 U/L 20 23 -  ALT 0 - 53 U/L 21 23 -    Lab Results  Component Value Date   WBC 8.4 04/04/2020   HGB 12.6 (L) 04/04/2020   HCT 39.3 04/04/2020   MCV 88.7 04/04/2020   PLT 214.0 04/04/2020   NEUTROABS 5.6 04/04/2020     RADIOGRAPHIC STUDIES: I have personally reviewed the radiological images as listed and agreed with the findings in the report. CT Head Wo Contrast  Result Date: 04/02/2020 CLINICAL DATA:  Head trauma. EXAM: CT HEAD WITHOUT CONTRAST TECHNIQUE: Contiguous axial images were obtained from the base of the skull through the vertex without intravenous contrast. COMPARISON:  November 16, 2019 FINDINGS: Brain: No evidence of acute infarction, hemorrhage, hydrocephalus, extra-axial collection or mass lesion/mass effect. Moderate brain parenchymal atrophy and periventricular microangiopathy. Vascular: Calcific atherosclerotic disease of the intra cavernous carotid arteries. Skull: Normal. Negative for fracture or focal  lesion. Sinuses/Orbits: Normal appearance of the lipids. Mild mucosal thickening of the ethmoid left maxillary and right sphenoid sinuses. Other: None. IMPRESSION: 1. No acute intracranial abnormality. 2. Atrophy, chronic microvascular disease. Electronically Signed   By: Fidela Salisbury M.D.   On: 04/02/2020 14:32   CT Chest W Contrast  Result Date: 04/02/2020 CLINICAL DATA:  Fall, left-sided injuries EXAM: CT CHEST, ABDOMEN, AND PELVIS WITH CONTRAST TECHNIQUE: Multidetector CT imaging of the chest, abdomen and pelvis was performed following the standard protocol during bolus administration of intravenous contrast. CONTRAST:  60mL OMNIPAQUE IOHEXOL 300 MG/ML  SOLN COMPARISON:  CT abdomen pelvis, 02/14/2020 FINDINGS: CT CHEST FINDINGS Cardiovascular: Aortic atherosclerosis. Aortic valve calcifications. Normal heart size. Three-vessel coronary artery calcifications. No pericardial effusion. Mediastinum/Nodes: No enlarged mediastinal, hilar, or axillary lymph nodes. Thyroid gland, trachea, and esophagus demonstrate no significant findings. Lungs/Pleura: Lungs are clear. No pleural effusion or pneumothorax. Musculoskeletal: No chest wall mass or suspicious bone lesions identified. CT ABDOMEN PELVIS FINDINGS Hepatobiliary: No solid liver abnormality is seen. Numerous low-attenuation lesions of the liver, the largest of which are simple cysts, numerous others too small to characterize although likely simple cysts and unchanged compared to prior examinations. No gallstones, gallbladder wall thickening, or biliary dilatation. Pancreas: Unremarkable. No pancreatic ductal dilatation or surrounding inflammatory changes. Spleen: Normal in size without significant abnormality. Adrenals/Urinary Tract: Adrenal glands are unremarkable. Mildly atrophic kidneys with numerous bilateral renal calculi. Bladder is unremarkable. Stomach/Bowel: Stomach is within normal limits. Appendix appears normal. No evidence of bowel wall  thickening, distention, or inflammatory changes. Vascular/Lymphatic: Aortic atherosclerosis. 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 No enlarged abdominal or pelvic lymph nodes. Reproductive: No mass or other abnormality. Other: No abdominal wall hernia or abnormality. No abdominopelvic ascites. Musculoskeletal: Osteopenia. There is new sclerosis of the right sacral ala, likely a subacute insufficiency fracture (series 3, image 756) IMPRESSION: 1. No CT evidence of acute traumatic injury to the chest, abdomen, or pelvis. 2. There is new sclerosis of the right sacral ala when compared to prior examination dated 02/14/2020, likely a subacute insufficiency fracture. 3.  Osteopenia. 4. Aortic Atherosclerosis (ICD10-I70.0). 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 5.  Extensive bilateral nonobstructive nephrolithiasis. Electronically Signed   By: Eddie Candle M.D.   On: 04/02/2020 16:22   CT Cervical Spine Wo Contrast  Result Date: 04/02/2020 CLINICAL DATA:  Head trauma. Neck and shoulder pain. EXAM: CT CERVICAL SPINE WITHOUT CONTRAST TECHNIQUE: Multidetector CT imaging of the cervical spine was performed without intravenous  contrast. Multiplanar CT image reconstructions were also generated. COMPARISON:  None. FINDINGS: Alignment: Normal. Skull base and vertebrae: No acute fracture. No primary bone lesion or focal pathologic process. Soft tissues and spinal canal: No prevertebral fluid or swelling. No visible canal hematoma. Disc levels:  Mild multilevel osteoarthritic changes. Upper chest: Negative. Other: None. IMPRESSION: 1. No evidence of acute traumatic injury to the cervical spine. 2. Mild multilevel osteoarthritic changes of the cervical spine. Electronically Signed   By: Fidela Salisbury M.D.   On: 04/02/2020 14:35   MR Lumbar Spine W Wo Contrast  Result  Date: 04/06/2020 CLINICAL DATA:  Weakness of both lower extremities. Neuropathy. Recent fall. Bilateral leg weakness. EXAM: MRI LUMBAR SPINE WITHOUT AND WITH CONTRAST TECHNIQUE: Multiplanar and multiecho pulse sequences of the lumbar spine were obtained without and with intravenous contrast. CONTRAST:  78mL GADAVIST GADOBUTROL 1 MMOL/ML IV SOLN COMPARISON:  Lumbar MRI 11/11/2019 FINDINGS: Segmentation:  Normal Alignment:  Normal Vertebrae:  No lumbar fracture or mass. Small area of bone marrow edema in the right sacrum with mild enhancement. This was not present previously and is most consistent with a nondisplaced fracture of the right sacrum. Left sacrum negative. Conus medullaris and cauda equina: Conus extends to the L1-2 level. Conus and cauda equina appear normal. Paraspinal and other soft tissues: Negative for paraspinous mass or adenopathy. Disc levels: L1-2: Negative L2-3: Mild disc bulging and mild facet degeneration. Mild subarticular stenosis bilaterally L3-4: Mild disc bulging and mild facet degeneration. Mild subarticular stenosis bilaterally L4-5: Disc degeneration with diffuse disc bulging. Small central disc protrusion unchanged. Bilateral facet hypertrophy. Mild spinal stenosis. Moderate subarticular stenosis on the right and mild subarticular stenosis on the left. L5-S1: Right laminectomy. Moderately large extruded disc fragment on the right with upgoing disc material compressing the right L5 nerve root in the foramen. This has progressed significantly from the prior MRI. Diffuse disc bulging is present with moderate subarticular stenosis bilaterally and severe right foraminal encroachment. IMPRESSION: 1. Small area of bone marrow edema and enhancement in the right sacrum which was not present on the prior MRI is consistent with a recent fracture. 2. Right laminectomy L5-S1. Moderately large extruded disc fragment on the right L5-S1 with compression of the right L5 nerve root has progressed  significantly since the prior MRI. 3. Degenerative changes elsewhere in the lumbar spine are stable. Electronically Signed   By: Franchot Gallo M.D.   On: 04/06/2020 08:08   CT ABDOMEN PELVIS W CONTRAST  Result Date: 04/02/2020 CLINICAL DATA:  Fall, left-sided injuries EXAM: CT CHEST, ABDOMEN, AND PELVIS WITH CONTRAST TECHNIQUE: Multidetector CT imaging of the chest, abdomen and pelvis was performed following the standard protocol during bolus administration of intravenous contrast. CONTRAST:  21mL OMNIPAQUE IOHEXOL 300 MG/ML  SOLN COMPARISON:  CT abdomen pelvis, 02/14/2020 FINDINGS: CT CHEST FINDINGS Cardiovascular: Aortic atherosclerosis. Aortic valve calcifications. Normal heart size. Three-vessel coronary artery calcifications. No pericardial effusion. Mediastinum/Nodes: No enlarged mediastinal, hilar, or axillary lymph nodes. Thyroid gland, trachea, and esophagus demonstrate no significant findings. Lungs/Pleura: Lungs are clear. No pleural effusion or pneumothorax. Musculoskeletal: No chest wall mass or suspicious bone lesions identified. CT ABDOMEN PELVIS FINDINGS Hepatobiliary: No solid liver abnormality is seen. Numerous low-attenuation lesions of the liver, the largest of which are simple cysts, numerous others too small to characterize although likely simple cysts and unchanged compared to prior examinations. No gallstones, gallbladder wall thickening, or biliary dilatation. Pancreas: Unremarkable. No pancreatic ductal dilatation or surrounding inflammatory changes. Spleen: Normal in size without  significant abnormality. Adrenals/Urinary Tract: Adrenal glands are unremarkable. Mildly atrophic kidneys with numerous bilateral renal calculi. Bladder is unremarkable. Stomach/Bowel: Stomach is within normal limits. Appendix appears normal. No evidence of bowel wall thickening, distention, or inflammatory changes. Vascular/Lymphatic: Aortic atherosclerosis. Unchanged focal aneurysm from the anterior aspect  of the left common iliac artery measuring approximately 1.0 cm. No enlarged abdominal or pelvic lymph nodes. Reproductive: No mass or other abnormality. Other: No abdominal wall hernia or abnormality. No abdominopelvic ascites. Musculoskeletal: Osteopenia. There is new sclerosis of the right sacral ala, likely a subacute insufficiency fracture (series 3, image 756) IMPRESSION: 1. No CT evidence of acute traumatic injury to the chest, abdomen, or pelvis. 2. There is new sclerosis of the right sacral ala when compared to prior examination dated 02/14/2020, likely a subacute insufficiency fracture. 3.  Osteopenia. 4. Aortic Atherosclerosis (ICD10-I70.0). 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 5.  Extensive bilateral nonobstructive nephrolithiasis. Electronically Signed   By: Eddie Candle M.D.   On: 04/02/2020 16:22   DG Knee Complete 4 Views Right  Result Date: 03/29/2020 CLINICAL DATA:  Right knee pain after fall 3 weeks ago. EXAM: RIGHT KNEE - COMPLETE 4+ VIEW COMPARISON:  Radiograph 07/14/2019 FINDINGS: Intramedullary rod in the femur is partially included. No periprosthetic lucency. No knee fracture or dislocation. Mild medial tibiofemoral joint space narrowing. Trace patellar spurs. No knee joint effusion. Patella is normally situated in the trochlear groove. Suggestion of decreased bone density from prior exam versus differences in technique. No focal soft tissue abnormality. IMPRESSION: 1. No acute fracture or dislocation. 2. Minimal osteoarthritis. Electronically Signed   By: Keith Rake M.D.   On: 03/29/2020 21:39   DG Hip Unilat W OR W/O Pelvis 2-3 Views Right  Result Date: 03/29/2020 CLINICAL DATA:  Progressive right hip pain after fall 3 weeks ago. Post right hip replacement. EXAM: DG HIP (WITH OR WITHOUT PELVIS) 2-3V RIGHT COMPARISON:  Included portion from abdominal CT 02/14/2020 FINDINGS: Right hip unipolar  arthroplasty in expected alignment with resection of the proximal femoral shaft. Long femoral stem is midline without periprosthetic lucency. Pubic rami are intact. No pelvic fracture. Pubic symphysis and sacroiliac joints are congruent. IMPRESSION: Right hip arthroplasty in expected alignment. No periprosthetic fracture or hardware complication. No acute pelvic fracture. Electronically Signed   By: Keith Rake M.D.   On: 03/29/2020 21:41    I discussed the assessment and treatment plan with the patient. The patient was provided an opportunity to ask questions and all were answered. The patient agreed with the plan and demonstrated an understanding of the instructions. The patient was advised to call back or seek an in-person evaluation if the symptoms worsen or if the condition fails to improve as anticipated.    I spent 40 minutes for the appointment reviewing test results, discuss management and coordination of care.  Bruce Lark, MD 04/18/2020 2:18 PM

## 2020-04-18 NOTE — Assessment & Plan Note (Signed)
I have reviewed the recommendation on anticoagulation therapy with the patient and his wife extensively From my last visit, I recommend minimum 6 months to a year of treatment He had CT imaging of the chest with contrast done 2 weeks ago when he had a fall in the emergency room Even though the CT imaging of the chest is not done with angiogram protocol, there is no evidence of blood clot in the imaging He has completed at least 3 months of anticoagulation therapy For perioperative anticoagulation management, he can stop his Eliquis for 48 hours prior to spine surgery without bridging therapy Per patient request, he would like me to relay this information to the neurosurgeon so that he can have his back surgery as soon as possible I spoke with his neurosurgeon, Dr. Sherwood Gambler and he recommended that I speak with Dr. Arnoldo Morale since he will be retiring He will likely need preoperative cardiac clearance either from his primary care doctor or cardiologist before he proceed with neurosurgery I will call him back once I have more information from his neurosurgeon

## 2020-04-19 ENCOUNTER — Telehealth: Payer: Self-pay | Admitting: Hematology and Oncology

## 2020-04-19 NOTE — Telephone Encounter (Signed)
No new orders per 4/20 los. No changes made to pt's schedule.

## 2020-04-20 ENCOUNTER — Telehealth: Payer: Self-pay

## 2020-04-20 NOTE — Telephone Encounter (Signed)
-----   Message from [name], MD sent at 04/20/2020  7:01 AM EDT ----- Regarding: neurosurgery appt Pls let him know I spoke with Dr. Sherwood Gambler and he said he will talk to Dr. Arnoldo Morale I called back a second time and give Dr. Adline Mango nurse my cell phone number 2 days ago and he has not called back. Dr. Sherwood Gambler said they might move his appt sooner

## 2020-04-20 NOTE — Telephone Encounter (Signed)
Called and given below message. He verbalized understanding. His appt has been moved up to 4/27.

## 2020-04-24 ENCOUNTER — Other Ambulatory Visit: Payer: Self-pay | Admitting: Gastroenterology

## 2020-04-25 DIAGNOSIS — M4807 Spinal stenosis, lumbosacral region: Secondary | ICD-10-CM | POA: Diagnosis not present

## 2020-04-25 DIAGNOSIS — I10 Essential (primary) hypertension: Secondary | ICD-10-CM | POA: Diagnosis not present

## 2020-04-25 DIAGNOSIS — Z6832 Body mass index (BMI) 32.0-32.9, adult: Secondary | ICD-10-CM | POA: Diagnosis not present

## 2020-04-25 DIAGNOSIS — M5126 Other intervertebral disc displacement, lumbar region: Secondary | ICD-10-CM | POA: Diagnosis not present

## 2020-05-04 ENCOUNTER — Ambulatory Visit (INDEPENDENT_AMBULATORY_CARE_PROVIDER_SITE_OTHER): Payer: Medicare Other | Admitting: Neurology

## 2020-05-04 ENCOUNTER — Encounter: Payer: Self-pay | Admitting: Neurology

## 2020-05-04 VITALS — BP 126/70 | HR 69 | Temp 97.0°F | Wt 234.0 lb

## 2020-05-04 DIAGNOSIS — R202 Paresthesia of skin: Secondary | ICD-10-CM

## 2020-05-04 MED ORDER — GABAPENTIN 100 MG PO CAPS: 100.0000 mg | ORAL_CAPSULE | Freq: Three times a day (TID) | ORAL | 2 refills | Status: DC

## 2020-05-04 NOTE — Progress Notes (Signed)
[facility] [address] [place]. [place] [address] [phone]       OFFICE FOLLOW UP VISIT NOTE  Bruce Ball Date of Birth:  04/05/1946 Medical Record Number:  [id number]   Referring MD:  [name]  Reason for Referral: Neuropathy  HPI: Initial visit 02/07/2020 Bruce Ball is a pleasant 74 year old Caucasian male seen today for initial office consultation visit for neuropathy and gait imbalance.  History is obtained from the patient and his wife is accompanying him today as well as review of electronic medical records and have reviewed available imaging films in PACS.  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  He has noticed the worsening of his paresthesias in his feet over the last couple of years as well as increasing balance difficulties.  He has had frequent falls in the last few months and started using a walker now which gives him more steadiness.  He did denies significant pain or discomfort and is able to sleep in the night.  He feels his discomfort paresthesias are more when he is on his feet and provide some relief when he rests and sits down.  He states he did have EMG nerve conduction study was done 7 to 8 years ago but is unable to give me the results.  He was seen by neurologist once in the remote past with neuropathy was first diagnosed but did not go for follow-up since his follow-up was in the [place] [facility] system.  He has not had any recent vitamin B12 levels neuropathy panel labs checked.  He had an episode of sudden onset of dizziness and right ear  hearing loss in 2017 for which she was seen in the emergency room and MRI scan at that time was unremarkable he was diagnosed with peripheral vestibular neuronitis but states his hearing did not significantly come back in the right ear and his balance was also affected since then.  He had a MRI scan of the brain done on 11/16/2019 for one of his falls and dizziness which showed no acute abnormality with moderate changes of chronic small vessel disease. Update 05/04/2020 : He returns for follow-up after last visit 3 months ago.  He continues to have numbness in his feet as well as gait and balance difficulties.  He did undergo lab work on last visit on 02/07/2024 which showed normal vitamin B12 levels of 298 as well as TSH and neuropathy panel labs were normal.  Methylmalonic acid levels were also normal.  ANA, ESR, rheumatoid factor and angiotensin-converting enzymes levels were also normal.  Hemoglobin A1c was 5.5.  He underwent EMG nerve conduction study on 03/13/2020 by Dr. Jannifer Franklin which showed some involvement of mainly motor muscles in the lower extremities with decreased recruitment   but no convincing evidence of sensorimotor neuropathy.  Patient was seen by neurosurgeon for back pain and apparently had an MRI scan of the lumbosacral spine on 04/06/2020 which showed moderate [place] large extruded disc fragment on the right at L5-S1 with compression of the right L5 nerve root which has progressed compared to  previous MRI.  He has intermittent right sided sciatica.  He has been advised surgery but patient is reluctant.  He states that every time he twists his back about 30 degrees or so he gets sciatic pain.  Is currently not on any medications for neuropathic pain or paresthesias.  He has previously tried gabapentin in the past and tolerated well and is willing to try it again. ROS:   14 system review of systems is positive for numbness, tingling, gait imbalance, decreased hearing and all other systems negative PMH:   Past Medical History:  Diagnosis Date  . CAD (coronary artery disease)   . Chronic back pain greater than 3 months duration   . Chronic leg pain   . Deafness in right ear   . Dyspnea    cause by PE  . Extrinsic asthma, unspecified 04/07/2009   PT DENIES  . GERD (gastroesophageal reflux disease)   . History of kidney stones   . HYPERTENSION 10/29/2007  . HYPOTENSION 08/13/2010  . LOW BACK PAIN 04/21/2008  . NEPHROLITHIASIS, HX OF 04/21/2008  . Pneumonia   . PUD, HX OF 04/21/2008  . Pulmonary emboli ([place]) 2021   noted on CT 12-2019  . Renal disorder     Social History:  Social History   Socioeconomic History  . Marital status: Married    Spouse name: Not on file  . Number of children: 2  . Years of education: Not on file  . Highest education level: Not on file  Occupational History  . Occupation: Optometrist  Tobacco Use  . Smoking status: Former Smoker    Packs/day: 1.00    Years: 17.00    Pack years: 17.00    Start date: 1963    Quit date: 12/30/1978    Years since quitting: 41.3  . Smokeless tobacco: Never Used  Substance and Sexual Activity  . Alcohol use: No  . Drug use: No  . Sexual activity: Not on file  Other Topics Concern  . Not on file  Social History Narrative  . Not on file   Social Determinants of Health   Financial Resource Strain:   . Difficulty of Paying Living Expenses:   Food Insecurity:   . Worried About Charity fundraiser in the Last Year:   . Arboriculturist in the Last Year:   Transportation Needs:   . Film/video editor (Medical):   Marland Kitchen Lack of Transportation (Non-Medical):   Physical Activity:   . Days of Exercise per Week:   . Minutes of Exercise per Session:   Stress:   . Feeling of Stress :   Social Connections:   . Frequency of Communication with Friends and Family:   . Frequency of Social Gatherings with Friends and Family:   . Attends Religious Services:   . Active Member of Clubs or Organizations:   . Attends Theatre manager Meetings:   Marland Kitchen Marital Status:   Intimate Partner Violence:   . Fear of Current or Ex-Partner:   . Emotionally Abused:   Marland Kitchen Physically Abused:   . Sexually Abused:     Medications:   Current Outpatient Medications on File Prior to Visit  Medication Sig Dispense Refill  . amLODipine (NORVASC) 5 MG tablet Take 5 mg by mouth at bedtime.     Marland Kitchen apixaban (ELIQUIS) 5 MG TABS tablet Take 2 tablets (29m) twice daily for 7 days, then 1 tablet (556m twice daily (Patient taking differently: Take 5 mg by mouth  2 (two) times daily. ) 60 tablet 1  . atorvastatin (LIPITOR) 20 MG tablet Take 20 mg at bedtime by mouth.     . carvedilol (COREG) 25 MG tablet Take 25 mg 2 (two) times daily by mouth.     . Cholecalciferol (VITAMIN D3) 50 MCG (2000 UT) capsule Take 2,000 Units by mouth in the morning and at bedtime.     . indapamide (LOZOL) 1.25 MG tablet Take 1.25 mg by mouth daily.    . isosorbide mononitrate (IMDUR) 30 MG 24 hr tablet Take 30 mg by mouth daily.     . metoCLOPramide (REGLAN) 5 MG tablet TAKE 1 TABLET (5 MG TOTAL) BY MOUTH 3 (THREE) TIMES DAILY BEFORE MEALS. 90 tablet 1  . omeprazole (PRILOSEC) 20 MG capsule Take 40 mg by mouth 2 (two) times daily.    . predniSONE (DELTASONE) 5 MG tablet Take 5 mg by mouth daily with breakfast.     No current facility-administered medications on file prior to visit.    Allergies:  No Known Allergies  Physical Exam General: Obese elderly Caucasian male, seated, in no evident distress Head: head normocephalic and atraumatic.   Neck: supple with no carotid or supraclavicular bruits Cardiovascular: regular rate and rhythm, no murmurs Musculoskeletal: no deformity Skin:  no rash/petichiae.  1+ pedal edema bilaterally left greater than right Vascular:  Normal pulses all extremities  Neurologic Exam Mental Status: Awake and fully alert. Oriented to place and time. Recent and remote memory intact. Attention span, concentration and fund of  knowledge appropriate. Mood and affect appropriate.  Cranial Nerves: Fundoscopic exam reveals sharp disc margins. Pupils equal, briskly reactive to light. Extraocular movements full without nystagmus. Visual fields full to confrontation. Hearing diminished significantly in the right ear.. Facial sensation intact. Face, tongue, palate moves normally and symmetrically.  Motor: Normal bulk and tone. Normal strength in all tested extremity muscles. Sensory.:  Hyperesthesia to touch , pinprick in both feet from ankle down left greater than right and significantly impaired, position and vibratory sensation.  In both feet from ankle down. Coordination: Rapid alternating movements normal in all extremities. Finger-to-nose and heel-to-shin performed accurately bilaterally. Gait and Station: Arises from chair with mild difficulty. Stance is broad-based l. Gait demonstrates mild ataxia with broad-based .  Unsteady while standing on either foot unsupported.  Romberg sign is weakly positive.  Unable to heel, toe and tandem walk without difficulty.  Reflexes: 1+ and symmetric. Toes downgoing.       ASSESSMENT: 74 year old Caucasian male with longstanding history of progressive lower extremity paresthesias and gait imbalance likely due to peripheral small fiber sensory polyneuropathy of undetermined etiology.   .  Recent history of deep vein thrombosis and pulmonary embolism on long-term anticoagulation on Eliquis now.  Remote history of sudden onset right ear hearing loss and dizziness in 2017 felt to be peripheral vestibular neuronitis.  Chronic low back pain and right leg sciatica due to degenerative lumbar spine disease with right L5-S1 disc extrusion and compression of L5 nerve root on recent MRI     PLAN: I had a long discussion the patient and his son regarding his lower extremity paresthesias which likely represents small fiber chronic peripheral neuropathy without any identifiable reversible cause despite  adequate testing.  I recommend trial of gabapentin 100 mg 3 times daily to help with paresthesia as well as with his radicular back and right leg pain.  We will increase it as tolerated if necessary.  He will return for follow-up in the  future in 3 months or call earlier if necessary.Greater than 50% time during this 30-minute  visit was spent on counseling and coordination of care about his neuropathy and gait imbalance and discussion about differential diagnosis and evaluation and treatment and answering questions.  Antony Contras, MD  [facility] [address] [place], [place] [address]  Phone [phone] Fax [phone] Note: This document was prepared with digital dictation and possible smart phrase technology. Any transcriptional errors that result from this process are unintentional.

## 2020-05-04 NOTE — Patient Instructions (Signed)
I had a long discussion the patient and his son regarding his lower extremity paresthesias which likely represents small fiber chronic peripheral neuropathy without any identifiable reversible cause despite adequate testing.  I recommend trial of gabapentin 100 mg 3 times daily to help with paresthesia as well as with his radicular back and right leg pain.  We will increase it as tolerated if necessary.  He will return for follow-up in the future in 3 months or call earlier if necessary.

## 2020-05-16 NOTE — Patient Instructions (Addendum)
Health Maintenance Due  Topic Date Due  . PNA vac Low Risk Adult (2 of 2 - PCV13) gets all immunizations at [facility]  05/20/2015   As long as you are on high dose omeprazole may be worth taking cyanocobalamin 1047mcg over the counter as omeprazole can decrease absorption  Please get [name] a copy of lab results from June first  Consider cardiology visit if have to have surgery for your back.   Hoping for a good visit with Dr. Minette Brine

## 2020-05-16 NOTE — Progress Notes (Signed)
Phone [phone] In person visit   Subjective:   Bruce Ball is a 74 y.o. year old very pleasant male patient who presents for/with See problem oriented charting Chief Complaint  Patient presents with  . Hypertension  . Hyperlipidemia   This visit occurred during the SARS-CoV-2 public health emergency.  Safety protocols were in place, including screening questions prior to the visit, additional usage of staff PPE, and extensive cleaning of exam room while observing appropriate contact time as indicated for disinfecting solutions.   Past Medical History-  Patient Active Problem List   Diagnosis Date Noted  . Aortic atherosclerosis ([place]) 05/17/2020    Priority: High  . Idiopathic neuropathy 01/28/2020    Priority: High  . Recurrent falls 01/28/2020    Priority: High  . Pulmonary emboli ([place]) 01/14/2020    Priority: High  . High risk medication use 02/28/2017    Priority: High  . Ataxia 05/18/2016    Priority: High  . Vestibular disequilibrium 05/18/2016    Priority: High  . Ankylosing spondylitis ([place]) 05/24/2014    Priority: High  . B12 deficiency 05/17/2020    Priority: Medium  . Osteoporosis 04/04/2020    Priority: Medium  . Chronic kidney disease (CKD), stage III (moderate) 01/19/2020    Priority: Medium  . GERD (gastroesophageal reflux disease) 12/22/2019    Priority: Medium  . Essential tremor 12/22/2019    Priority: Medium  . Hyperlipidemia 11/11/2017    Priority: Medium  . Right ureteral stone 11/05/2017    Priority: Medium  . History of prostate cancer 03/08/2017    Priority: Medium  . Hx of chondrosarcoma 02/28/2017    Priority: Medium  . Peripheral edema 06/08/2012    Priority: Medium  . Extrinsic asthma 04/07/2009    Priority: Medium  . LOW BACK PAIN 04/21/2008    Priority: Medium  . History of peptic ulcer disease 04/21/2008    Priority: Medium  . Essential hypertension 10/29/2007    Priority: Medium  . Acute pain of right knee 07/15/2019     Priority: Low  . Community acquired pneumonia of right upper lobe of lung 11/05/2017    Priority: Low  . History of total right hip replacement 03/08/2017    Priority: Low  . DDD (degenerative disc disease), thoracic 02/28/2017    Priority: Low  . Lung nodule, solitary 05/24/2014    Priority: Low  . Iliac artery aneurysm ([place]) 05/17/2020  . Gross hematuria 06/14/2019    Medications- reviewed and updated Current Outpatient Medications  Medication Sig Dispense Refill  . amLODipine (NORVASC) 5 MG tablet Take 5 mg by mouth at bedtime.     Marland Kitchen apixaban (ELIQUIS) 5 MG TABS tablet Take 2 tablets (10mg ) twice daily for 7 days, then 1 tablet (5mg ) twice daily (Patient taking differently: Take 5 mg by mouth 2 (two) times daily. ) 60 tablet 1  . atorvastatin (LIPITOR) 20 MG tablet Take 20 mg at bedtime by mouth.     . carvedilol (COREG) 25 MG tablet Take 25 mg 2 (two) times daily by mouth.     . Cholecalciferol (VITAMIN D3) 50 MCG (2000 UT) capsule Take 2,000 Units by mouth in the morning and at bedtime.     . gabapentin (NEURONTIN) 100 MG capsule Take 1 capsule (100 mg total) by mouth 3 (three) times daily. 90 capsule 2  . indapamide (LOZOL) 1.25 MG tablet Take 1.25 mg by mouth daily.    . isosorbide mononitrate (IMDUR) 30 MG 24 hr tablet Take 30  mg by mouth daily.     . metoCLOPramide (REGLAN) 5 MG tablet TAKE 1 TABLET (5 MG TOTAL) BY MOUTH 3 (THREE) TIMES DAILY BEFORE MEALS. 90 tablet 1  . omeprazole (PRILOSEC) 20 MG capsule Take 40 mg by mouth 2 (two) times daily.    . predniSONE (DELTASONE) 5 MG tablet Take 5 mg by mouth daily with breakfast.     No current facility-administered medications for this visit.     Objective:  BP 124/72   Pulse 79   Temp 98.6 F (37 C) (Temporal)   Ht 5\' 11"  (1.803 m)   Wt 238 lb (108 kg)   BMI 33.19 kg/m  Gen: NAD, resting comfortably CV: RRR no murmurs rubs or gallops Lungs: CTAB no crackles, wheeze, rhonchi Ext: painful 1+ edema- edema worse on  right than left despite DVT history in left leg Skin: warm, dry Neuro: wheelchari today    Assessment and Plan   #Other notes: 1.  We will be on the look out for labs from the [place] on June 1-instead to see if creatinine improving, potassium better, mild anemia improving  #Chronic back pain-moderately large extruded disc fragment right L5-S1 with compression of the right L5 nerve root with significant progression on MRI on 04/06/2020 S:Saw Dr. Arnoldo Morale and it was not particularly helpful-he suggested pain management and no surgery the patient has done pain management in the past and is not interested. Dr. Rita Ohara has retired.  He is going to see Dr. Minette Brine with [facility] for second opinion this month.   Dr. Alvy Bimler of oncology said he could come off of eliquis for surgery as has already been 3 months of therpay after PE noted.   Using rolling walker and doing better- no recent falls. We had previously discussed possibly 4 pronged instead.   A/P: I am thankful patient is getting a second opinion-with worsening compression of the right L5 nerve root and recurrent falls-I think it is reasonable to see what therapeutic options are available.  As noted Dr. Alvy Bimler of hematology is okay with patient coming off of medication short-term-Eliquis for surgery  I am thrilled he is at least not had any falls since last visit.  # PE/left leg DVT S: Discovered January 13, 2020.  Also with left leg DVT.  Minimum 62months planned but Dr. Alvy Bimler has cleared to come off short term for surgery if needed   This was thought provoked by sedentary activity but unless sedentary activity is significantly changed after back is worked on-likely needs lifelong therapy A/P: Patient is compliant with Eliquis-we will continue current medication.   #hypertension/CKD stage III-GFR worsened during recent hospitalization but was improving on last check S: medication: Amlodipine 2.5mg , Carvedilol 25mg , imdur 30 mg Home  readings #s:  has not been checking readings at home.  BP Readings from Last 3 Encounters:  05/17/20 124/72  05/04/20 126/70  04/04/20 132/78  A/P: Excellent control of blood pressure today-continue current medication  Her CKD stage III recommended repeat BMP the patient has labs on June 1 with the [facility] and he would like to defer until that time-he will send me a copy of the labs.  #hyperlipidemia/aortic atherosclerosis S: Medication:atorvastatin 20mg  Lab Results  Component Value Date   CHOL 104 11/30/2019   HDL 56 11/30/2019   LDLCALC 38 11/30/2019   LDLDIRECT 145.4 06/27/2011   TRIG 49 11/30/2019   CHOLHDL 4.5 05/18/2016   A/P: LDL very well controlled on atorvastatin 20 mg-patient has known aortic atherosclerosis incidental  finding on imaging-I believe we should target LDL under 70.  Also had some coronary calcium.  He is on Imdur through the [place] but has never seen cardiology-he denies this being started for chest pain. -Consider starting aspirin but we will hold off unless patient come off Eliquis -Patient would need cardiology clearance for potential surgery -Reports chemical stress test years ago that was negative  # GERD/Hiatal hernia S: Dr. Havery Moros  Has advised 40mg  twice daily Lab Results  Component Value Date   VITAMINB12 298 02/07/2020  would recommend over the counter b12. Reports reaction to injections in the past- rash on legs A/P: Well-controlled but on high-dose PPI-recommended taking B12-consider B12 recheck in the future   Recommended follow up: Return in about 6 months (around 11/17/2020) for follow up- or sooner if needed. Future Appointments  Date Time Provider [place]  07/25/2020 10:00 AM [name], MD [facility] None  08/23/2020  1:45 PM Frann Rider, NP [facility] None  11/17/2020  1:40 PM Yong Channel Brayton Mars, MD [facility]    Lab/Order associations:   ICD-10-CM   1. Essential hypertension  I10   2. Stage 3 chronic kidney disease,  unspecified whether stage 3a or 3b CKD  N18.30   3. Hyperlipidemia, unspecified hyperlipidemia type  E78.5   4. Iliac artery aneurysm (HCC)  I72.3   5. Aortic atherosclerosis (HCC)  I70.0   6. Other acute pulmonary embolism without acute cor pulmonale (HCC)  I26.99   7. Peripheral edema  R60.9   8. Gastroesophageal reflux disease, unspecified whether esophagitis present  K21.9   9. Recurrent falls  R29.6   10. B12 deficiency  E53.8    Return precautions advised.  [name], MD

## 2020-05-17 ENCOUNTER — Ambulatory Visit (INDEPENDENT_AMBULATORY_CARE_PROVIDER_SITE_OTHER): Payer: Medicare Other | Admitting: Family Medicine

## 2020-05-17 ENCOUNTER — Other Ambulatory Visit: Payer: Self-pay

## 2020-05-17 ENCOUNTER — Encounter: Payer: Self-pay | Admitting: Family Medicine

## 2020-05-17 VITALS — BP 124/72 | HR 79 | Temp 98.6°F | Ht 71.0 in | Wt 238.0 lb

## 2020-05-17 DIAGNOSIS — E785 Hyperlipidemia, unspecified: Secondary | ICD-10-CM | POA: Diagnosis not present

## 2020-05-17 DIAGNOSIS — K219 Gastro-esophageal reflux disease without esophagitis: Secondary | ICD-10-CM | POA: Diagnosis not present

## 2020-05-17 DIAGNOSIS — I10 Essential (primary) hypertension: Secondary | ICD-10-CM | POA: Diagnosis not present

## 2020-05-17 DIAGNOSIS — R296 Repeated falls: Secondary | ICD-10-CM | POA: Diagnosis not present

## 2020-05-17 DIAGNOSIS — I723 Aneurysm of iliac artery: Secondary | ICD-10-CM | POA: Insufficient documentation

## 2020-05-17 DIAGNOSIS — R609 Edema, unspecified: Secondary | ICD-10-CM | POA: Diagnosis not present

## 2020-05-17 DIAGNOSIS — E538 Deficiency of other specified B group vitamins: Secondary | ICD-10-CM | POA: Diagnosis not present

## 2020-05-17 DIAGNOSIS — I700 Atherosclerosis of aorta: Secondary | ICD-10-CM | POA: Diagnosis not present

## 2020-05-17 DIAGNOSIS — N183 Chronic kidney disease, stage 3 unspecified: Secondary | ICD-10-CM | POA: Diagnosis not present

## 2020-05-17 DIAGNOSIS — I2699 Other pulmonary embolism without acute cor pulmonale: Secondary | ICD-10-CM | POA: Diagnosis not present

## 2020-05-17 NOTE — Assessment & Plan Note (Signed)
#  Chronic back pain-moderately large extruded disc fragment right L5-S1 with compression of the right L5 nerve root with significant progression on MRI on 04/06/2020 S:Saw Dr. Arnoldo Morale and it was not particularly helpful-he suggested pain management and no surgery the patient has done pain management in the past and is not interested. Dr. Rita Ohara has retired.  He is going to see Dr. Minette Brine with [facility] for second opinion this month.   Dr. Alvy Bimler of oncology said he could come off of eliquis for surgery as has already been 3 months of therpay after PE noted.   Using rolling walker and doing better- no recent falls. We had previously discussed possibly 4 pronged instead.   A/P: I am thankful patient is getting a second opinion-with worsening compression of the right L5 nerve root and recurrent falls-I think it is reasonable to see what therapeutic options are available.  As noted Dr. Alvy Bimler of hematology is okay with patient coming off of medication short-term-Eliquis for surgery  I am thrilled he is at least not had any falls since last visit.

## 2020-05-17 NOTE — Assessment & Plan Note (Signed)
May 2021-patient reports injections in the past but had rash on his legs.  In February 2021 B12 was low normal-recommended oral supplement

## 2020-05-17 NOTE — Assessment & Plan Note (Signed)
Noted CT abdomen pelvis 04/02/2020-noted stable from prior exams-consider 1 year repeat

## 2020-05-17 NOTE — Assessment & Plan Note (Signed)
#   PE/left leg DVT S: Discovered January 13, 2020.  Also with left leg DVT.  Minimum 53months planned but Dr. Alvy Bimler has cleared to come off short term for surgery if needed   This was thought provoked by sedentary activity but unless sedentary activity is significantly changed after back is worked on-likely needs lifelong therapy A/P: Patient is compliant with Eliquis-we will continue current medication.

## 2020-05-17 NOTE — Assessment & Plan Note (Signed)
Very sensitive legs.  Right greater than left despite DVT left leg January 2021

## 2020-05-18 ENCOUNTER — Other Ambulatory Visit (HOSPITAL_COMMUNITY): Payer: Self-pay | Admitting: Neurology

## 2020-05-18 DIAGNOSIS — M47816 Spondylosis without myelopathy or radiculopathy, lumbar region: Secondary | ICD-10-CM | POA: Diagnosis not present

## 2020-05-18 DIAGNOSIS — M48062 Spinal stenosis, lumbar region with neurogenic claudication: Secondary | ICD-10-CM | POA: Diagnosis not present

## 2020-05-18 DIAGNOSIS — Z9889 Other specified postprocedural states: Secondary | ICD-10-CM | POA: Diagnosis not present

## 2020-05-18 DIAGNOSIS — R202 Paresthesia of skin: Secondary | ICD-10-CM | POA: Diagnosis not present

## 2020-05-18 DIAGNOSIS — M5116 Intervertebral disc disorders with radiculopathy, lumbar region: Secondary | ICD-10-CM | POA: Diagnosis not present

## 2020-05-18 DIAGNOSIS — M48061 Spinal stenosis, lumbar region without neurogenic claudication: Secondary | ICD-10-CM | POA: Diagnosis not present

## 2020-05-18 MED ORDER — TOPIRAMATE 25 MG PO TABS: 25.0000 mg | ORAL_TABLET | Freq: Every day | ORAL | 2 refills | Status: DC

## 2020-05-18 NOTE — Telephone Encounter (Signed)
Okay to DC gabapentin and try Topamax 25 mg twice daily instead

## 2020-05-23 ENCOUNTER — Encounter: Payer: Self-pay | Admitting: Hematology and Oncology

## 2020-05-23 ENCOUNTER — Telehealth: Payer: Self-pay | Admitting: *Deleted

## 2020-05-23 NOTE — Telephone Encounter (Signed)
Per Dr.Gorsuch, called pt to make aware to stop Eliquis 48 hours prior to surgery on 6/30. Advised to stop Eliquis on 6/28 and begin again after discharge from hospital. Also scheduled lab for pt for 7/27 before office visit. Letter was faxed to [facility] [phone]. Confirmation fax was received. Pt verbalized understanding

## 2020-06-22 DIAGNOSIS — Z01810 Encounter for preprocedural cardiovascular examination: Secondary | ICD-10-CM | POA: Diagnosis not present

## 2020-06-22 DIAGNOSIS — I10 Essential (primary) hypertension: Secondary | ICD-10-CM | POA: Diagnosis not present

## 2020-06-22 DIAGNOSIS — Z8679 Personal history of other diseases of the circulatory system: Secondary | ICD-10-CM | POA: Diagnosis not present

## 2020-06-22 DIAGNOSIS — M5134 Other intervertebral disc degeneration, thoracic region: Secondary | ICD-10-CM | POA: Diagnosis not present

## 2020-06-28 DIAGNOSIS — K3184 Gastroparesis: Secondary | ICD-10-CM | POA: Diagnosis not present

## 2020-06-28 DIAGNOSIS — K449 Diaphragmatic hernia without obstruction or gangrene: Secondary | ICD-10-CM | POA: Diagnosis not present

## 2020-06-28 DIAGNOSIS — I723 Aneurysm of iliac artery: Secondary | ICD-10-CM | POA: Diagnosis not present

## 2020-06-28 DIAGNOSIS — N183 Chronic kidney disease, stage 3 unspecified: Secondary | ICD-10-CM | POA: Diagnosis not present

## 2020-06-28 DIAGNOSIS — G4733 Obstructive sleep apnea (adult) (pediatric): Secondary | ICD-10-CM | POA: Diagnosis not present

## 2020-06-28 DIAGNOSIS — K219 Gastro-esophageal reflux disease without esophagitis: Secondary | ICD-10-CM | POA: Diagnosis not present

## 2020-06-28 DIAGNOSIS — M4807 Spinal stenosis, lumbosacral region: Secondary | ICD-10-CM | POA: Diagnosis not present

## 2020-06-28 DIAGNOSIS — I129 Hypertensive chronic kidney disease with stage 1 through stage 4 chronic kidney disease, or unspecified chronic kidney disease: Secondary | ICD-10-CM | POA: Diagnosis not present

## 2020-06-28 DIAGNOSIS — M4726 Other spondylosis with radiculopathy, lumbar region: Secondary | ICD-10-CM | POA: Diagnosis not present

## 2020-06-28 DIAGNOSIS — E785 Hyperlipidemia, unspecified: Secondary | ICD-10-CM | POA: Diagnosis not present

## 2020-06-28 HISTORY — PX: POSTERIOR LUMBAR FUSION: SHX6036

## 2020-06-29 DIAGNOSIS — N183 Chronic kidney disease, stage 3 unspecified: Secondary | ICD-10-CM | POA: Diagnosis not present

## 2020-06-29 DIAGNOSIS — G4733 Obstructive sleep apnea (adult) (pediatric): Secondary | ICD-10-CM | POA: Diagnosis not present

## 2020-06-29 DIAGNOSIS — M4807 Spinal stenosis, lumbosacral region: Secondary | ICD-10-CM | POA: Diagnosis not present

## 2020-06-29 DIAGNOSIS — I129 Hypertensive chronic kidney disease with stage 1 through stage 4 chronic kidney disease, or unspecified chronic kidney disease: Secondary | ICD-10-CM | POA: Diagnosis not present

## 2020-06-29 DIAGNOSIS — M4726 Other spondylosis with radiculopathy, lumbar region: Secondary | ICD-10-CM | POA: Diagnosis not present

## 2020-06-29 DIAGNOSIS — E785 Hyperlipidemia, unspecified: Secondary | ICD-10-CM | POA: Diagnosis not present

## 2020-07-06 ENCOUNTER — Telehealth: Payer: Self-pay

## 2020-07-06 NOTE — Telephone Encounter (Signed)
-----   Message from [name], MD sent at 07/06/2020  3:04 PM EDT ----- Regarding: on 7/27 can he come in later? his appt time is double booked

## 2020-07-06 NOTE — Telephone Encounter (Signed)
Called and given below message. He verbalized understanding. Appts rescheduled to later times. He is aware of appts times/date.

## 2020-07-11 DIAGNOSIS — Z7189 Other specified counseling: Secondary | ICD-10-CM | POA: Insufficient documentation

## 2020-07-11 DIAGNOSIS — C419 Malignant neoplasm of bone and articular cartilage, unspecified: Secondary | ICD-10-CM | POA: Insufficient documentation

## 2020-07-11 DIAGNOSIS — M810 Age-related osteoporosis without current pathological fracture: Secondary | ICD-10-CM | POA: Insufficient documentation

## 2020-07-11 DIAGNOSIS — R1031 Right lower quadrant pain: Secondary | ICD-10-CM | POA: Insufficient documentation

## 2020-07-11 DIAGNOSIS — N12 Tubulo-interstitial nephritis, not specified as acute or chronic: Secondary | ICD-10-CM | POA: Insufficient documentation

## 2020-07-11 DIAGNOSIS — H2510 Age-related nuclear cataract, unspecified eye: Secondary | ICD-10-CM | POA: Insufficient documentation

## 2020-07-11 DIAGNOSIS — D531 Other megaloblastic anemias, not elsewhere classified: Secondary | ICD-10-CM | POA: Insufficient documentation

## 2020-07-11 DIAGNOSIS — C4490 Unspecified malignant neoplasm of skin, unspecified: Secondary | ICD-10-CM | POA: Insufficient documentation

## 2020-07-11 DIAGNOSIS — H02883 Meibomian gland dysfunction of right eye, unspecified eyelid: Secondary | ICD-10-CM | POA: Insufficient documentation

## 2020-07-11 DIAGNOSIS — Z23 Encounter for immunization: Secondary | ICD-10-CM | POA: Insufficient documentation

## 2020-07-24 ENCOUNTER — Other Ambulatory Visit: Payer: Self-pay | Admitting: Hematology and Oncology

## 2020-07-24 DIAGNOSIS — I2699 Other pulmonary embolism without acute cor pulmonale: Secondary | ICD-10-CM

## 2020-07-25 ENCOUNTER — Other Ambulatory Visit: Payer: Medicare Other

## 2020-07-25 ENCOUNTER — Inpatient Hospital Stay: Payer: Medicare Other | Attending: Hematology and Oncology | Admitting: Hematology and Oncology

## 2020-07-25 ENCOUNTER — Encounter: Payer: Self-pay | Admitting: Hematology and Oncology

## 2020-07-25 ENCOUNTER — Inpatient Hospital Stay: Payer: Medicare Other

## 2020-07-25 ENCOUNTER — Ambulatory Visit: Payer: Medicare Other | Admitting: Hematology and Oncology

## 2020-07-25 ENCOUNTER — Other Ambulatory Visit: Payer: Self-pay

## 2020-07-25 DIAGNOSIS — I2699 Other pulmonary embolism without acute cor pulmonale: Secondary | ICD-10-CM

## 2020-07-25 DIAGNOSIS — Z7901 Long term (current) use of anticoagulants: Secondary | ICD-10-CM | POA: Diagnosis not present

## 2020-07-25 DIAGNOSIS — R234 Changes in skin texture: Secondary | ICD-10-CM | POA: Diagnosis not present

## 2020-07-25 DIAGNOSIS — N183 Chronic kidney disease, stage 3 unspecified: Secondary | ICD-10-CM

## 2020-07-25 LAB — CBC WITH DIFFERENTIAL/PLATELET
Abs Immature Granulocytes: 0.03 10*3/uL (ref 0.00–0.07)
Basophils Absolute: 0.1 10*3/uL (ref 0.0–0.1)
Basophils Relative: 1 %
Eosinophils Absolute: 0.1 10*3/uL (ref 0.0–0.5)
Eosinophils Relative: 1 %
HCT: 38.1 % — ABNORMAL LOW (ref 39.0–52.0)
Hemoglobin: 11.6 g/dL — ABNORMAL LOW (ref 13.0–17.0)
Immature Granulocytes: 0 %
Lymphocytes Relative: 15 %
Lymphs Abs: 1.2 10*3/uL (ref 0.7–4.0)
MCH: 24.6 pg — ABNORMAL LOW (ref 26.0–34.0)
MCHC: 30.4 g/dL (ref 30.0–36.0)
MCV: 80.9 fL (ref 80.0–100.0)
Monocytes Absolute: 0.6 10*3/uL (ref 0.1–1.0)
Monocytes Relative: 7 %
Neutro Abs: 6.2 10*3/uL (ref 1.7–7.7)
Neutrophils Relative %: 76 %
Platelets: 253 10*3/uL (ref 150–400)
RBC: 4.71 MIL/uL (ref 4.22–5.81)
RDW: 15.4 % (ref 11.5–15.5)
WBC: 8.2 10*3/uL (ref 4.0–10.5)
nRBC: 0 % (ref 0.0–0.2)

## 2020-07-25 LAB — BASIC METABOLIC PANEL - CANCER CENTER ONLY
Anion gap: 5 (ref 5–15)
BUN: 26 mg/dL — ABNORMAL HIGH (ref 8–23)
CO2: 29 mmol/L (ref 22–32)
Calcium: 9.4 mg/dL (ref 8.9–10.3)
Chloride: 108 mmol/L (ref 98–111)
Creatinine: 1.42 mg/dL — ABNORMAL HIGH (ref 0.61–1.24)
GFR, Est AFR Am: 56 mL/min — ABNORMAL LOW (ref >60)
GFR, Estimated: 48 mL/min — ABNORMAL LOW (ref >60)
Glucose, Bld: 104 mg/dL — ABNORMAL HIGH (ref 70–99)
Potassium: 4.6 mmol/L (ref 3.5–5.1)
Sodium: 142 mmol/L (ref 135–145)

## 2020-07-25 NOTE — Assessment & Plan Note (Signed)
His kidney function is stable/improved compared to his baseline We discussed the importance of adequate hydration and risk factors modifications

## 2020-07-25 NOTE — Progress Notes (Signed)
[place] OFFICE PROGRESS NOTE  Bruce Olp, MD  ASSESSMENT & PLAN:  Pulmonary emboli [facility]) His mobility is poor I recommend minimum 1 year of anticoagulation therapy I plan to see him back in 6 months for further follow-up  Chronic kidney disease (CKD), stage III (moderate) His kidney function is stable/improved compared to his baseline We discussed the importance of adequate hydration and risk factors modifications  Thin skin He has thin skin and excessive bruises He had a bandage put over his left hand since 3 weeks ago I attempted to remove it but not successful I recommend gentle removal slowly at home over the next few weeks and to let it dry in the air if possible    No orders of the defined types were placed in this encounter.   The total time spent in the appointment was 20 minutes encounter with patients including review of chart and various tests results, discussions about plan of care and coordination of care plan   All questions were answered. The patient knows to call the clinic with any problems, questions or concerns. No barriers to learning was detected.    Bruce Lark, MD 7/27/20214:15 PM  INTERVAL HISTORY: Bruce Ball 74 y.o. male returns for further follow-up He is taking anticoagulation therapy for history of PE His recent back surgery was successful.  He is able to walk without unstable gait or risk of falling He had traumatic skin injury on his left hand recently The patient denies any recent signs or symptoms of bleeding such as spontaneous epistaxis, hematuria or hematochezia.    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 He also have history of prostate cancer status post prostatectomy, in remission without the need for adjuvant chemotherapy or radiation therapy He is a retired English as a second language teacher who has been exposed to agent orange when he was younger He  is here accompanied by his wife, Bruce Ball  He was recently hospitalized between January 14 to January 16, 2020 after presentation with shortness of breath He was found to have left lower extremity DVT and bilateral PE He complained of shortness of breath shortly after EGD dilatation was performed for chronic reflux disease and esophageal stricture  On January 13, 2020, CT angiogram showed Pulmonary emboli in all lobes of both lungs. Evidence of right heart strain (RV/LV Ratio = 1.06) consistent with at least submassive (intermediate risk) PE. The presence of right heart strain has been associated with an increased risk of morbidity and mortality. Coronary artery disease. Aortic Atherosclerosis (ICD10-I70.0).  On January 15, venous Doppler ultrasound showed Right: There is no evidence of deep vein thrombosis in the lower extremity. No cystic structure found in the popliteal fossa.  Left: Findings consistent with acute deep vein thrombosis involving the left peroneal veins. No cystic structure found in the popliteal fossa.   Echocardiogram report showed IMPRESSIONS   1. Left ventricular ejection fraction, by visual estimation, is 60 to 65%. The left ventricle has normal function. There is moderately increased left ventricular hypertrophy.  2. Moderate asymmetric hypertrophy of the basal septum measuring 50mm (posterior wall 25mm)  3. The mitral valve is normal in structure. No evidence of mitral valve regurgitation.  4. The aortic valve is tricuspid. Aortic valve regurgitation is trivial. Mild aortic valve sclerosis without stenosis.  5. The tricuspid valve is grossly normal.  6. The pulmonic valve was not well visualized. Pulmonic valve regurgitation is trivial.  7.  Global right ventricle has normal systolic function.The right ventricular size is normal.  8. Left atrial size was normal.  9. Right atrial size was normal. 10. TR signal is inadequate for assessing pulmonary artery systolic  pressure  He was anticoagulated and was subsequently discharged He had history of recurrent falls, the last fall was over a month ago According to the patient, after he landed at the top of the stairs, his legs buckled with some associated dizziness but he did not sustain major injury A few months back, he also tripped and fell in his yard According to the patient, he has pre-existing peripheral neuropathy affecting both toes and the bottom of his feet of unknown etiology His wife stated he walks with a shuffle He had history of B12 deficiency requiring vitamin B12 supplementation but according to the [place] records, he was told that his B12 level has been good and he has stopped receiving B12 injections He has never been formally evaluated for the cause of his falls or causes of neuropathy by a neurologist On review of outside records, he was seen by a neurologist several years ago for headache but he did not recall that visit His mobility is poor He has chronic bilateral lower extremity edema for some time When he presented to the emergency room, he denies calf pain but he does have bilateral anterior shin pain that is tender to touch for some time He has chronic back pain requiring neurosurgery and have undergone extensive treatment including surgery, implantation of stimulator and removal, extensive physical therapy and rehab for chronic back pain He denies recent history of preceding trauma but did have falls Denies long distance travel, recent surgery, or smoking. He had remote history of smoking He had prior surgeries before and never had perioperative thromboembolic events. The patient had not been exposed on testosterone replacement therapy  No family history of blood clots So far, he tolerated anticoagulation therapy well without bleeding complications.  On 04/02/2020, he had CT chest, abdomen and pelvis  1. No CT evidence of acute traumatic injury to the chest, abdomen, or pelvis. 2.  There is new sclerosis of the right sacral ala when compared to prior examination dated 02/14/2020, likely a subacute insufficiency fracture. 3.  Osteopenia. 4. Aortic Atherosclerosis (ICD10-I70.0). 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 5.  Extensive bilateral nonobstructive nephrolithiasis.   I have reviewed the past medical history, past surgical history, social history and family history with the patient and they are unchanged from previous note.  ALLERGIES:  has No Known Allergies.  MEDICATIONS:  Current Outpatient Medications  Medication Sig Dispense Refill  . amLODipine (NORVASC) 2.5 MG tablet Take 2.5 mg by mouth daily.    Marland Kitchen apixaban (ELIQUIS) 5 MG TABS tablet Take 2 tablets (10mg ) twice daily for 7 days, then 1 tablet (5mg ) twice daily (Patient taking differently: Take 5 mg by mouth 2 (two) times daily. ) 60 tablet 1  . atorvastatin (LIPITOR) 20 MG tablet Take 20 mg at bedtime by mouth.     . carvedilol (COREG) 25 MG tablet Take 25 mg 2 (two) times daily by mouth.     . Cholecalciferol (VITAMIN D3) 50 MCG (2000 UT) capsule Take 2,000 Units by mouth in the morning and at bedtime.     . indapamide (LOZOL) 1.25 MG tablet Take 1.25 mg by mouth daily.    . isosorbide mononitrate (IMDUR) 30 MG 24 hr tablet Take 30 mg  by mouth daily.     . metoCLOPramide (REGLAN) 5 MG tablet TAKE 1 TABLET (5 MG TOTAL) BY MOUTH 3 (THREE) TIMES DAILY BEFORE MEALS. 90 tablet 1  . omeprazole (PRILOSEC) 20 MG capsule Take 40 mg by mouth 2 (two) times daily.    . predniSONE (DELTASONE) 5 MG tablet Take 5 mg by mouth daily with breakfast.    . topiramate (TOPAMAX) 25 MG tablet Take 1 tablet (25 mg total) by mouth at bedtime. 30 tablet 2   No current facility-administered medications for this visit.     REVIEW OF SYSTEMS:   Constitutional: Denies fevers, chills or night sweats Eyes: Denies blurriness of vision Ears, nose, mouth,  throat, and face: Denies mucositis or sore throat Respiratory: Denies cough, dyspnea or wheezes Cardiovascular: Denies palpitation, chest discomfort or lower extremity swelling Gastrointestinal:  Denies nausea, heartburn or change in bowel habits Skin: Denies abnormal skin rashes Lymphatics: Denies new lymphadenopathy  Neurological:Denies numbness, tingling or new weaknesses Behavioral/Psych: Mood is stable, no new changes  All other systems were reviewed with the patient and are negative.  PHYSICAL EXAMINATION: ECOG PERFORMANCE STATUS: 1 - Symptomatic but completely ambulatory  Vitals:   07/25/20 1212  BP: (!) 136/67  Pulse: 55  Resp: 18  Temp: 98.5 F (36.9 C)  SpO2: 100%   Filed Weights   07/25/20 1212  Weight: (!) 237 lb 3.2 oz (107.6 kg)    GENERAL:alert, no distress and comfortable SKIN: He has extensive bruises. NEURO: alert & oriented x 3 with fluent speech, no focal motor/sensory deficits  LABORATORY DATA:  I have reviewed the data as listed     Component Value Date/Time   NA 142 07/25/2020 1154   NA 144 11/30/2019 0000   K 4.6 07/25/2020 1154   CL 108 07/25/2020 1154   CO2 29 07/25/2020 1154   GLUCOSE 104 (H) 07/25/2020 1154   BUN 26 (H) 07/25/2020 1154   BUN 30 (A) 11/30/2019 0000   CREATININE 1.42 (H) 07/25/2020 1154   CALCIUM 9.4 07/25/2020 1154   PROT 5.9 (L) 04/04/2020 1527   PROT 5.9 (L) 02/07/2020 1509   ALBUMIN 3.7 04/04/2020 1527   AST 20 04/04/2020 1527   ALT 21 04/04/2020 1527   ALKPHOS 67 04/04/2020 1527   BILITOT 0.7 04/04/2020 1527   GFRNONAA 48 (L) 07/25/2020 1154   GFRAA 56 (L) 07/25/2020 1154    No results found for: SPEP, UPEP  Lab Results  Component Value Date   WBC 8.2 07/25/2020   NEUTROABS 6.2 07/25/2020   HGB 11.6 (L) 07/25/2020   HCT 38.1 (L) 07/25/2020   MCV 80.9 07/25/2020   PLT 253 07/25/2020      Chemistry      Component Value Date/Time   NA 142 07/25/2020 1154   NA 144 11/30/2019 0000   K 4.6 07/25/2020  1154   CL 108 07/25/2020 1154   CO2 29 07/25/2020 1154   BUN 26 (H) 07/25/2020 1154   BUN 30 (A) 11/30/2019 0000   CREATININE 1.42 (H) 07/25/2020 1154   GLU 99 11/30/2019 0000      Component Value Date/Time   CALCIUM 9.4 07/25/2020 1154   ALKPHOS 67 04/04/2020 1527   AST 20 04/04/2020 1527   ALT 21 04/04/2020 1527   BILITOT 0.7 04/04/2020 1527

## 2020-07-25 NOTE — Assessment & Plan Note (Signed)
He has thin skin and excessive bruises He had a bandage put over his left hand since 3 weeks ago I attempted to remove it but not successful I recommend gentle removal slowly at home over the next few weeks and to let it dry in the air if possible

## 2020-07-25 NOTE — Assessment & Plan Note (Signed)
His mobility is poor I recommend minimum 1 year of anticoagulation therapy I plan to see him back in 6 months for further follow-up

## 2020-07-26 ENCOUNTER — Telehealth: Payer: Self-pay | Admitting: Hematology and Oncology

## 2020-07-26 NOTE — Telephone Encounter (Signed)
Scheduled per 7/27 sch msg. Called and left a msg, mailing appt letter and calendar printout  ° °

## 2020-08-03 DIAGNOSIS — Z981 Arthrodesis status: Secondary | ICD-10-CM | POA: Insufficient documentation

## 2020-08-03 DIAGNOSIS — Z4889 Encounter for other specified surgical aftercare: Secondary | ICD-10-CM | POA: Insufficient documentation

## 2020-08-03 DIAGNOSIS — Z4789 Encounter for other orthopedic aftercare: Secondary | ICD-10-CM | POA: Diagnosis not present

## 2020-08-23 ENCOUNTER — Encounter: Payer: Self-pay | Admitting: Adult Health

## 2020-08-23 ENCOUNTER — Ambulatory Visit (INDEPENDENT_AMBULATORY_CARE_PROVIDER_SITE_OTHER): Payer: Medicare Other | Admitting: Adult Health

## 2020-08-23 VITALS — BP 126/72 | HR 62 | Ht 71.0 in | Wt 236.0 lb

## 2020-08-23 DIAGNOSIS — G609 Hereditary and idiopathic neuropathy, unspecified: Secondary | ICD-10-CM | POA: Diagnosis not present

## 2020-08-23 DIAGNOSIS — R202 Paresthesia of skin: Secondary | ICD-10-CM | POA: Diagnosis not present

## 2020-08-23 NOTE — Progress Notes (Signed)
[facility] [address] [place]. [place] [address] [phone]       OFFICE FOLLOW UP VISIT NOTE  [name] Date of Birth:  [birth year] Medical Record Number:  [id number]   Referring MD:  Bruce Ball  Reason for Referral: Neuropathy  Chief Complaint  Patient presents with  . Follow-up    3 month f/u. States he is doing well. Had back surgery on June 30th which has decreased his neuropathy.   . treatment room    alone      HPI:   Today, 08/23/2020, [name] returns for follow-up regarding neuropathy  Since prior visit, he underwent transforaminal lumbar interbody fusion at L5-S1 on 06/28/2020 by [facility] neurosurgery which was uncomplicated  He has been doing well since surgery with resolution of radiating pain into his lower extremities and lower extremity numbness significantly improved.  He will occasionally experience lower back pain towards the end of the day but this is also been improving.  After prior visit, he was initiated on gabapentin but due to difficulty tolerating, transition him to topiramate but he has since discontinued as it was no longer needed.  His gait and balance have also greatly improved.    He has no questions or concerns at this time.     History provided for reference purposes only Update 05/04/2020 Dr. Leonie Ball:  He returns for follow-up after last visit 3 months ago.  He continues to have numbness in his feet as well as gait and balance difficulties.  He did undergo lab work on last visit on 02/07/2024 which showed normal vitamin B12 levels of 298 as well as TSH and neuropathy panel labs were normal.  Methylmalonic acid levels were also normal.  ANA, ESR, rheumatoid factor and angiotensin-converting enzymes levels were also normal.  Hemoglobin A1c was 5.5.  He underwent EMG nerve conduction study on 03/13/2020 by Dr. Jannifer Ball which showed some involvement of mainly motor muscles in the lower extremities with decreased  recruitment   but no convincing evidence of sensorimotor neuropathy.  Patient was seen by neurosurgeon for back pain and apparently had an MRI scan of the lumbosacral spine on 04/06/2020 which showed moderate Bruce Ball large extruded disc fragment on the right at L5-S1 with compression of the right L5 nerve root which has progressed compared to previous MRI.  He has intermittent right sided sciatica.  He has been advised surgery but patient is reluctant.  He states that every time he twists his back about 30 degrees or so he gets sciatic pain.  Is currently not on any medications for neuropathic pain or paresthesias.  He has previously tried gabapentin in the past and tolerated well and is willing to try it again.  Initial visit 02/07/2020 Dr. Leonie Ball: [name] is a pleasant 74 year old Caucasian male seen today for initial office consultation visit for neuropathy and gait imbalance.  History is obtained from the patient and his wife is accompanying him today as well as review of electronic medical records and have reviewed available imaging films in PACS.  [name] is a 74 year old gentleman with remote history of chondrosarcoma treated with surgery more than 50 years ago and in long-term remission as well as history of prostate cancer treated with surgery in remission history of idiopathic neuropathy which she states 7 to 8 years ago was diagnosed with low B12 and was on replacement but he was told by the [place] system that his B12 levels were adequate and hence he stopped B12  replacement several years ago.  He has noticed the worsening of his paresthesias in his feet over the last couple of years as well as increasing balance difficulties.  He has had frequent falls in the last few months and started using a walker now which gives him more steadiness.  He did denies significant pain or discomfort and is able to sleep in the night.  He feels his discomfort paresthesias are more when he is on his feet and provide some relief  when he rests and sits down.  He states he did have EMG nerve conduction study was done 7 to 8 years ago but is unable to give me the results.  He was seen by neurologist once in the remote past with neuropathy was first diagnosed but did not go for follow-up since his follow-up was in the [place] health system.  He has not had any recent vitamin B12 levels neuropathy panel labs checked.  He had an episode of sudden onset of dizziness and right ear hearing loss in 2017 for which she was seen in the emergency room and MRI scan at that time was unremarkable he was diagnosed with peripheral vestibular neuronitis but states his hearing did not significantly come back in the right ear and his balance was also affected since then.  He had a MRI scan of the brain done on 11/16/2019 for one of his falls and dizziness which showed no acute abnormality with moderate changes of chronic small vessel disease.    ROS:   14 system review of systems is positive for occasional numbness/tingling and mild back pain and all other systems negative PMH:  Past Medical History:  Diagnosis Date  . CAD (coronary artery disease)   . Chronic back pain greater than 3 months duration   . Chronic leg pain   . Deafness in right ear   . Dyspnea    cause by PE  . Extrinsic asthma, unspecified 04/07/2009   PT DENIES  . GERD (gastroesophageal reflux disease)   . History of kidney stones   . HYPERTENSION 10/29/2007  . HYPOTENSION 08/13/2010  . LOW BACK PAIN 04/21/2008  . NEPHROLITHIASIS, HX OF 04/21/2008  . Pneumonia   . PUD, HX OF 04/21/2008  . Pulmonary emboli ([place]) 2021   noted on CT 12-2019  . Renal disorder     Social History:  Social History   Socioeconomic History  . Marital status: Married    Spouse name: Not on file  . Number of children: 2  . Years of education: Not on file  . Highest education level: Not on file  Occupational History  . Occupation: Optometrist  Tobacco Use  . Smoking status: Former Smoker     Packs/day: 1.00    Years: 17.00    Pack years: 17.00    Start date: 1963    Quit date: 12/30/1978    Years since quitting: 41.6  . Smokeless tobacco: Never Used  Vaping Use  . Vaping Use: Never used  Substance and Sexual Activity  . Alcohol use: No  . Drug use: No  . Sexual activity: Not on file  Other Topics Concern  . Not on file  Social History Narrative  . Not on file   Social Determinants of Health   Financial Resource Strain:   . Difficulty of Paying Living Expenses: Not on file  Food Insecurity:   . Worried About Charity fundraiser in the Last Year: Not on file  . Ran Out of Food  in the Last Year: Not on file  Transportation Needs:   . Lack of Transportation (Medical): Not on file  . Lack of Transportation (Non-Medical): Not on file  Physical Activity:   . Days of Exercise per Week: Not on file  . Minutes of Exercise per Session: Not on file  Stress:   . Feeling of Stress : Not on file  Social Connections:   . Frequency of Communication with Friends and Family: Not on file  . Frequency of Social Gatherings with Friends and Family: Not on file  . Attends Religious Services: Not on file  . Active Member of Clubs or Organizations: Not on file  . Attends Archivist Meetings: Not on file  . Marital Status: Not on file  Intimate Partner Violence:   . Fear of Current or Ex-Partner: Not on file  . Emotionally Abused: Not on file  . Physically Abused: Not on file  . Sexually Abused: Not on file    Medications:   Current Outpatient Medications on File Prior to Visit  Medication Sig Dispense Refill  . amLODipine (NORVASC) 2.5 MG tablet Take 2.5 mg by mouth daily.    Marland Kitchen apixaban (ELIQUIS) 5 MG TABS tablet Take 2 tablets (64m) twice daily for 7 days, then 1 tablet (533m twice daily (Patient taking differently: Take 5 mg by mouth 2 (two) times daily. ) 60 tablet 1  . atorvastatin (LIPITOR) 20 MG tablet Take 20 mg at bedtime by mouth.     . carvedilol (COREG) 25  MG tablet Take 25 mg 2 (two) times daily by mouth.     . Cholecalciferol (VITAMIN D3) 50 MCG (2000 UT) capsule Take 2,000 Units by mouth in the morning and at bedtime.     . indapamide (LOZOL) 1.25 MG tablet Take 1.25 mg by mouth daily.    . isosorbide mononitrate (IMDUR) 30 MG 24 hr tablet Take 30 mg by mouth daily.     . metoCLOPramide (REGLAN) 5 MG tablet TAKE 1 TABLET (5 MG TOTAL) BY MOUTH 3 (THREE) TIMES DAILY BEFORE MEALS. 90 tablet 1  . omeprazole (PRILOSEC) 20 MG capsule Take 40 mg by mouth 2 (two) times daily.    . predniSONE (DELTASONE) 5 MG tablet Take 5 mg by mouth daily with breakfast.     No current facility-administered medications on file prior to visit.    Allergies:  No Known Allergies  Today's Vitals   08/23/20 1328  BP: 126/72  Pulse: 62  Weight: 236 lb (107 kg)  Height: _0  (1.803 m)   Body mass index is 32.92 kg/m.    Physical Exam General: Obese elderly Caucasian male, seated, in no evident distress Head: head normocephalic and atraumatic.   Neck: supple with no carotid or supraclavicular bruits Cardiovascular: regular rate and rhythm, no murmurs Musculoskeletal: no deformity Skin:  no rash/petichiae.  Vascular:  Normal pulses all extremities  Neurologic Exam Mental Status: Awake and fully alert. Oriented to place and time. Recent and remote memory intact. Attention span, concentration and fund of knowledge appropriate. Mood and affect appropriate.  Cranial Nerves: Pupils equal, briskly reactive to light. Extraocular movements full without nystagmus. Visual fields full to confrontation. Hearing diminished significantly in the right ear.. Facial sensation intact. Face, tongue, palate moves normally and symmetrically.  Motor: Normal bulk and tone. Normal strength in all tested extremity muscles. Sensory.:  Intact light touch sensation and slightly decreased vibratory sensation L>R distally Coordination: Rapid alternating movements normal in all  extremities. Finger-to-nose and  heel-to-shin performed accurately bilaterally. Gait and Station: Arises from chair without difficulty. Stance is broad-based. Gait demonstrates broad-based gait with mild unsteadiness initially.  No use of assistive device.  Reflexes: 1+ and symmetric. Toes downgoing.       ASSESSMENT/PLAN: 74 year old Caucasian male with longstanding history of progressive lower extremity paresthesias and gait imbalance likely due to peripheral small fiber sensory polyneuropathy of undetermined etiology.  Recent history of deep vein thrombosis and pulmonary embolism on long-term anticoagulation on Eliquis now.  Remote history of sudden onset right ear hearing loss and dizziness in 2017 felt to be peripheral vestibular neuronitis.  Chronic low back pain and right leg sciatica due to degenerative lumbar spine disease with right L5-S1 disc extrusion and compression of L5 nerve root s/p transforaminal lumbar interbody fusion at L5-S1 on 06/28/2020   Significant improvement after surgical procedure with resolution of pain radiating down lower extremities and only mild occasional paresthesias.  Self discontinued topiramate due to resolution of prior symptoms.  He will continue to follow with orthopedic spine center routinely for routine follow-up   As he has remarkably improved since procedure, he may follow-up on an as-needed basis which patient also requested.  Advised to call office with any questions or concerns in the future    I spent 20 minutes of face-to-face and non-face-to-face time with patient.  This included previsit chart review, lab review, study review, order entry, electronic health record documentation, patient education/discussion regarding resolution of severe paresthesias affecting gait and balance after his spinal procedure and answered all questions to patient satisfaction   Frann Rider, AGNP-BC  [facility] [address] [place], [place] [address]  Phone [phone] Fax [phone] Note: This document was prepared with digital dictation and possible smart phrase technology. Any transcriptional errors that result from this process are unintentional.

## 2020-08-23 NOTE — Patient Instructions (Addendum)
Your Plan:  Doristine Devoid to see you doing better after your surgery!!   Keep up with recommended exercises and hopeful ongoing recovery    As you are stable at this time, you can follow up in office on a as needed basis but please do not hesitate to call with any questions or concerns in the future      Thank you for coming to see [name] at [facility]. I hope we have been able to provide you high quality care today.  You may receive a patient satisfaction survey over the next few weeks. We would appreciate your feedback and comments so that we may continue to improve ourselves and the health of our patients.

## 2020-08-29 NOTE — Progress Notes (Signed)
I agree with the above plan 

## 2020-09-22 DIAGNOSIS — R200 Anesthesia of skin: Secondary | ICD-10-CM | POA: Diagnosis not present

## 2020-09-22 DIAGNOSIS — R269 Unspecified abnormalities of gait and mobility: Secondary | ICD-10-CM | POA: Diagnosis not present

## 2020-09-22 DIAGNOSIS — Z981 Arthrodesis status: Secondary | ICD-10-CM | POA: Diagnosis not present

## 2020-09-22 DIAGNOSIS — Z4789 Encounter for other orthopedic aftercare: Secondary | ICD-10-CM | POA: Diagnosis not present

## 2020-09-28 DIAGNOSIS — Z23 Encounter for immunization: Secondary | ICD-10-CM | POA: Diagnosis not present

## 2020-10-13 ENCOUNTER — Telehealth: Payer: Self-pay

## 2020-10-13 NOTE — Telephone Encounter (Signed)
Advised patient to go to UC as we are full.   Nurse Assessment Nurse: Ysidro Evert, RN, Levada Dy Date/Time (Eastern Time): 10/13/2020 9:36:38 AM Confirm and document reason for call. If symptomatic, describe symptoms. ---Caller states he is having pain in both legs in the calf area. The pain started yesterday afternoon. He has bruising on both calves. No fever Does the patient have any new or worsening symptoms? ---Yes Will a triage be completed? ---Yes Related visit to physician within the last 2 weeks? ---No Does the PT have any chronic conditions? (i.e. diabetes, asthma, this includes High risk factors for pregnancy, etc.) ---No Is this a behavioral health or substance abuse call? ---No Guidelines Guideline Title Affirmed Question Affirmed Notes Nurse Date/Time Eilene Ghazi Time) Leg Pain [1] Thigh or calf pain AND [2] only 1 side AND [3] present > 1 hour (Exception: chronic unchanged pain) Ysidro Evert, RN, Levada Dy 10/13/2020 9:38:20 AM Disp. Time Eilene Ghazi Time) Disposition Final User 10/13/2020 9:43:01 AM See HCP within 4 Hours (or PCP triage) Yes Ysidro Evert, RN, Marin Shutter Disagree/Comply Comply PLEASE NOTE: All timestamps contained within this report are represented as [place] Standard Time. CONFIDENTIALTY NOTICE: This fax transmission is intended only for the addressee. It contains information that is legally privileged, confidential or otherwise protected from use or disclosure. If you are not the intended recipient, you are strictly prohibited from reviewing, disclosing, copying using or disseminating any of this information or taking any action in reliance on or regarding this information. If you have received this fax in error, please notify us immediately by telephone so that we can arrange for its return to [name]. Phone: [phone], Toll-Free: [phone], Fax: [phone] Page: 2 of 2 Call Id: [id number] [place] Understands Yes PreDisposition Did not know what to do Care Advice Given  Per Guideline SEE HCP (OR PCP TRIAGE) WITHIN 4 HOURS: * IF OFFICE WILL BE OPEN: You need to be seen within the next 3 or 4 hours. Call your doctor (or NP/PA) now or as soon as the office opens. CARE ADVICE given per Leg Pain (Adult) guideline. CALL EMS IF: * You develop any chest pain or shortness of breath. Referrals REFERRED TO PCP OFFIC

## 2020-10-13 NOTE — Telephone Encounter (Signed)
Do any other sites have openings?  Otherwise agree with disposition as I am out of office this afternoon

## 2020-10-13 NOTE — Telephone Encounter (Signed)
Fyi.

## 2020-10-13 NOTE — Telephone Encounter (Signed)
Scheduled pt with Bruce Ball on Monday 10/16/2020

## 2020-10-13 NOTE — Telephone Encounter (Signed)
FYI

## 2020-10-16 ENCOUNTER — Other Ambulatory Visit: Payer: Self-pay | Admitting: *Deleted

## 2020-10-16 ENCOUNTER — Encounter: Payer: Self-pay | Admitting: Physician Assistant

## 2020-10-16 ENCOUNTER — Other Ambulatory Visit: Payer: Self-pay

## 2020-10-16 ENCOUNTER — Ambulatory Visit (INDEPENDENT_AMBULATORY_CARE_PROVIDER_SITE_OTHER): Payer: Medicare Other | Admitting: Physician Assistant

## 2020-10-16 ENCOUNTER — Ambulatory Visit (HOSPITAL_COMMUNITY)
Admission: RE | Admit: 2020-10-16 | Discharge: 2020-10-16 | Disposition: A | Discharge status: Home / Self Care | Payer: Medicare Other | Source: Ambulatory Visit | Attending: Physician Assistant | Admitting: Physician Assistant

## 2020-10-16 VITALS — BP 120/64 | HR 71 | Temp 98.4°F | Ht 71.0 in | Wt 238.2 lb

## 2020-10-16 DIAGNOSIS — M79661 Pain in right lower leg: Secondary | ICD-10-CM

## 2020-10-16 DIAGNOSIS — R0602 Shortness of breath: Secondary | ICD-10-CM

## 2020-10-16 DIAGNOSIS — M79662 Pain in left lower leg: Secondary | ICD-10-CM

## 2020-10-16 NOTE — Patient Instructions (Signed)
It was great to see you!  We are getting an ultrasound of your legs to look for blood clots. I suspect that this is unlikely since you are taking your blood thinners appropriately, however we need to be certain. If you do have a blood clot, we will have you follow-up with Dr. Alvy Bimler. If you do not have a blood clot, we will discuss next options for treatment.  Please follow-up with Dr. Yong Channel if your shortness of breath persists.  If you ever develop chest pain, please go to the ER.  Take care,  Inda Coke PA-C

## 2020-10-16 NOTE — Progress Notes (Signed)
Bruce Ball is a 74 y.o. male here for a new problem.  I acted as a Education administrator for [employer], PA-C Anselmo Pickler, LPN   History of Present Illness:   Chief Complaint  Patient presents with  . Leg Pain    HPI   Leg pain Pt c/o bilateral calf pain started on Friday. Also has bruising on back of both calves. Pt says right calf has a knot. Has not taken any medication for symptoms. R calf with more pain than L calf. Has mild LE swelling bilaterally, but reports that's normal for him. Denies significant pain with walking. [facility] Had one episode of SOB yesterday when walking to the bathroom. Denies: chest pain, palpitations.  Takes eliquis 5 mg BID regularly, denies any concerns with compliance. History of PE in January 2021 and DVT in LEFT lower extremity. Has been told to take this likely indefinitely.  He did hold his eliquis (as instructed by his oncologist, Dr. Alvy Bimler) - June 28th -- July 1st for a procedure.    Past Medical History:  Diagnosis Date  . CAD (coronary artery disease)   . Chronic back pain greater than 3 months duration   . Chronic leg pain   . Deafness in right ear   . Dyspnea    cause by PE  . Extrinsic asthma, unspecified 04/07/2009   PT DENIES  . GERD (gastroesophageal reflux disease)   . History of kidney stones   . HYPERTENSION 10/29/2007  . HYPOTENSION 08/13/2010  . LOW BACK PAIN 04/21/2008  . NEPHROLITHIASIS, HX OF 04/21/2008  . Pneumonia   . PUD, HX OF 04/21/2008  . Pulmonary emboli ([place]) 2021   noted on CT 12-2019  . Renal disorder      Social History   Tobacco Use  . Smoking status: Former Smoker    Packs/day: 1.00    Years: 17.00    Pack years: 17.00    Start date: 1963    Quit date: 12/30/1978    Years since quitting: 41.8  . Smokeless tobacco: Never Used  Vaping Use  . Vaping Use: Never used  Substance Use Topics  . Alcohol use: No  . Drug use: No    Past Surgical History:   Procedure Laterality Date  . BACK SURGERY    . bone cancer  1970   rt femur removed and steel rod placed  . CYSTOSCOPY WITH RETROGRADE PYELOGRAM, URETEROSCOPY AND STENT PLACEMENT Right 11/06/2017   Procedure: CYSTOSCOPY WITH  STENT PLACEMENT RIGHT;  Surgeon: Raynelle Bring, MD;  Location: [facility];  Service: Urology;  Laterality: Right;  . CYSTOSCOPY WITH RETROGRADE PYELOGRAM, URETEROSCOPY AND STENT PLACEMENT Left 06/09/2019   Procedure: CYSTOSCOPY WITH RETROGRADE PYELOGRAM, URETEROSCOPY AND STENT PLACEMENT X2;  Surgeon: [name], MD;  Location: [facility];  Service: Urology;  Laterality: Left;  . CYSTOSCOPY WITH RETROGRADE PYELOGRAM, URETEROSCOPY AND STENT PLACEMENT Bilateral 03/01/2020   Procedure: CYSTOSCOPY WITH RETROGRADE PYELOGRAM, URETEROSCOPY AND STENT PLACEMENT;  Surgeon: [name], MD;  Location: [facility];  Service: Urology;  Laterality: Bilateral;  75 MINS  . CYSTOSCOPY/RETROGRADE/URETEROSCOPY     1 year ago at [place] in [place]  . CYSTOSCOPY/URETEROSCOPY/HOLMIUM LASER Right 11/24/2017   Procedure: CYSTOSCOPY/URETEROSCOPY/ RETROGRADE/STENT REMOVAL;  Surgeon: Raynelle Bring, MD;  Location: [facility];  Service: Urology;  Laterality: Right;  . HOLMIUM LASER APPLICATION Bilateral 03/06/9023   Procedure: HOLMIUM LASER APPLICATION;  Surgeon: [name], MD;  Location: [facility];  Service: Urology;  Laterality: Bilateral;  . prosatectomy      Family History  Problem Relation Age of Onset  . COPD Father   . Cancer Other        prostate  . Prostate cancer Brother   . Esophageal cancer Neg Hx   . Stomach cancer Neg Hx   . Pancreatic cancer Neg Hx   . Colon cancer Neg Hx     No Known Allergies  Current Medications:   Current Outpatient Medications:  .  amLODipine (NORVASC) 2.5 MG tablet, Take 2.5 mg by mouth daily., Disp: , Rfl:  .  apixaban (ELIQUIS) 5 MG TABS tablet, Take 2 tablets (10mg ) twice daily for 7 days, then 1 tablet (5mg ) twice daily (Patient taking differently: Take  5 mg by mouth 2 (two) times daily. ), Disp: 60 tablet, Rfl: 1 .  atorvastatin (LIPITOR) 20 MG tablet, Take 20 mg at bedtime by mouth. , Disp: , Rfl:  .  carvedilol (COREG) 25 MG tablet, Take 25 mg 2 (two) times daily by mouth. , Disp: , Rfl:  .  Cholecalciferol (VITAMIN D3) 50 MCG (2000 UT) capsule, Take 2,000 Units by mouth in the morning and at bedtime. , Disp: , Rfl:  .  indapamide (LOZOL) 1.25 MG tablet, Take 1.25 mg by mouth daily., Disp: , Rfl:  .  isosorbide mononitrate (IMDUR) 30 MG 24 hr tablet, Take 30 mg by mouth daily. , Disp: , Rfl:  .  metoCLOPramide (REGLAN) 5 MG tablet, TAKE 1 TABLET (5 MG TOTAL) BY MOUTH 3 (THREE) TIMES DAILY BEFORE MEALS., Disp: 90 tablet, Rfl: 1 .  omeprazole (PRILOSEC) 20 MG capsule, Take 40 mg by mouth 2 (two) times daily., Disp: , Rfl:  .  predniSONE (DELTASONE) 5 MG tablet, Take 5 mg by mouth daily with breakfast., Disp: , Rfl:  .  gabapentin (NEURONTIN) 100 MG capsule, Take by mouth. (Patient not taking: Reported on 10/16/2020), Disp: , Rfl:    Review of Systems:   ROS  Negative unless otherwise specified per HPI.  Vitals:   Vitals:   10/16/20 1411  BP: 120/64  Pulse: 71  Temp: 98.4 F (36.9 C)  TempSrc: Temporal  SpO2: 96%  Weight: 238 lb 4 oz (108.1 kg)  Height: 5\' 11"  (1.803 m)     Body mass index is 33.23 kg/m.  Physical Exam:   Physical Exam Vitals and nursing note reviewed.  Constitutional:      General: He is not in acute distress.    Appearance: He is well-developed. He is not ill-appearing or toxic-appearing.  Cardiovascular:     Rate and Rhythm: Normal rate and regular rhythm.     Pulses: Normal pulses.          Dorsalis pedis pulses are 2+ on the right side and 2+ on the left side.       Posterior tibial pulses are 2+ on the right side and 2+ on the left side.     Heart sounds: Normal heart sounds, S1 normal and S2 normal.  Pulmonary:     Effort: Pulmonary effort is normal.     Breath sounds: Normal breath sounds.   Musculoskeletal:     Right lower leg: 1+ Edema present.     Left lower leg: 1+ Edema present.     Comments: Significant TTP to R medial calf area; no erythema or significant calf swelling  Skin:    General: Skin is warm and dry.  Neurological:     Mental Status: He is alert.  GCS: GCS eye subscore is 4. GCS verbal subscore is 5. GCS motor subscore is 6.  Psychiatric:        Speech: Speech normal.        Behavior: Behavior normal. Behavior is cooperative.       Assessment and Plan:   [place] was seen today for leg pain.  Diagnoses and all orders for this visit:  Bilateral calf pain -     VAS [name] LOWER EXTREMITY VENOUS (DVT); Future   Will order stat U/S for DVT work-up. If positive, will reach out to hematology (Dr. Alvy Bimler) for further evaluation and management. If negative, likely refer to sports medicine or trial conservative management for calf strain.  SOB Will have patient follow-up with PCP regarding this. Discussed that if he has any chest pain or severe worsening of symptoms, he needs to go to the ER.  CMA or LPN served as scribe during this visit. History, Physical, and Plan performed by medical provider. The above documentation has been reviewed and is accurate and complete.   Inda Coke, PA-C

## 2020-10-17 DIAGNOSIS — Z981 Arthrodesis status: Secondary | ICD-10-CM | POA: Diagnosis not present

## 2020-10-17 DIAGNOSIS — R269 Unspecified abnormalities of gait and mobility: Secondary | ICD-10-CM | POA: Diagnosis not present

## 2020-10-18 ENCOUNTER — Encounter: Payer: Self-pay | Admitting: Family Medicine

## 2020-10-18 ENCOUNTER — Ambulatory Visit (INDEPENDENT_AMBULATORY_CARE_PROVIDER_SITE_OTHER): Payer: Medicare Other | Admitting: Family Medicine

## 2020-10-18 ENCOUNTER — Ambulatory Visit: Payer: Self-pay

## 2020-10-18 ENCOUNTER — Other Ambulatory Visit: Payer: Self-pay

## 2020-10-18 VITALS — BP 114/72 | HR 76 | Ht 71.0 in | Wt 240.0 lb

## 2020-10-18 DIAGNOSIS — M79662 Pain in left lower leg: Secondary | ICD-10-CM | POA: Diagnosis not present

## 2020-10-18 DIAGNOSIS — S86919A Strain of unspecified muscle(s) and tendon(s) at lower leg level, unspecified leg, initial encounter: Secondary | ICD-10-CM

## 2020-10-18 DIAGNOSIS — M79661 Pain in right lower leg: Secondary | ICD-10-CM

## 2020-10-18 NOTE — Progress Notes (Signed)
Subjective:    CC: B calf pain, R >L  I, Molly Weber, LAT, ATC, am serving as scribe for Dr. Lynne Leader.  HPI: Pt is a 74 y/o male presenting w/ c/o B calf pain, R>L, since last Friday, Oct. 15, 2021.  He locates his pain to .  He reports feeling a "knot" in his R calf and notes B lower leg swelling.  Of note, pt has a hx of PE and L LE DVT and currently takes Eliquis 5mg  bid.  He recently had back surgery and has been released to increase his activity.  He has been doing water walking in the pool for the last few weeks.  He does not recall any specific injury.  Start physical therapy for his back at [facility].  Radiating pain: yes in both post-med lower legs Swelling: in his B lower legs Aggravating factors: Nothing Treatments tried: Nothing  Diagnostic testing: B LE venous doppler US- 10/16/20  Pertinent review of Systems: No fevers or chills.  Relevant historical information:.  DVT/PE on Eliquis.  History of prostate cancer.  History lumbar fusion recently.   Objective:    Vitals:   10/18/20 1425  BP: 114/72  Pulse: 76  SpO2: 96%   General: Well Developed, well nourished, and in no acute distress.   MSK: Legs bilaterally slight bruising present bilateral posterior calves.  Not particular tender to palpation.  Normal knee and calf motion. Strength intact.  No significant palpable defects.      Lab and Radiology Results No results found for this or any previous visit (from the past 72 hour(s)). VAS [name] LOWER EXTREMITY VENOUS (DVT)  Result Date: 10/17/2020  Lower Venous DVTStudy Indications: Pain. Other Indications: Bilateral calf pain for 4-5 fdays. Performing Technologist: Ralene Cork RVT  Examination Guidelines: A complete evaluation includes B-mode imaging, spectral Doppler, color Doppler, and power Doppler as needed of all accessible portions of each vessel. Bilateral testing is considered an integral part of a complete examination. Limited  examinations for reoccurring indications may be performed as noted. The reflux portion of the exam is performed with the patient in reverse Trendelenburg.  +---------+---------------+---------+-----------+----------+--------------+ RIGHT    CompressibilityPhasicitySpontaneityPropertiesThrombus Aging +---------+---------------+---------+-----------+----------+--------------+ CFV      Full           Yes      Yes                                 +---------+---------------+---------+-----------+----------+--------------+ SFJ      Full                    Yes                                 +---------+---------------+---------+-----------+----------+--------------+ FV Prox  Full           Yes      Yes                                 +---------+---------------+---------+-----------+----------+--------------+ FV Mid   Full           Yes      Yes                                 +---------+---------------+---------+-----------+----------+--------------+  FV DistalFull           Yes      Yes                                 +---------+---------------+---------+-----------+----------+--------------+ POP      Full           Yes      Yes                                 +---------+---------------+---------+-----------+----------+--------------+ PTV      Full                    Yes                                 +---------+---------------+---------+-----------+----------+--------------+ PERO     Full                    Yes                                 +---------+---------------+---------+-----------+----------+--------------+ GSV      Full           Yes      Yes                                 +---------+---------------+---------+-----------+----------+--------------+  +---------+---------------+---------+-----------+----------+--------------+ LEFT     CompressibilityPhasicitySpontaneityPropertiesThrombus Aging  +---------+---------------+---------+-----------+----------+--------------+ CFV      Full           Yes      Yes                                 +---------+---------------+---------+-----------+----------+--------------+ SFJ      Full                    Yes                                 +---------+---------------+---------+-----------+----------+--------------+ FV Prox  Full           Yes      Yes                                 +---------+---------------+---------+-----------+----------+--------------+ FV Mid   Full           Yes      Yes                                 +---------+---------------+---------+-----------+----------+--------------+ FV DistalFull           Yes      Yes                                 +---------+---------------+---------+-----------+----------+--------------+ POP      Full           Yes      Yes                                 +---------+---------------+---------+-----------+----------+--------------+  PTV      Full                    Yes                                 +---------+---------------+---------+-----------+----------+--------------+ PERO     Full                    Yes                                 +---------+---------------+---------+-----------+----------+--------------+ GSV      Full           Yes      Yes                                 +---------+---------------+---------+-----------+----------+--------------+  Findings reported to [facility] at 3:30 pm.  Summary: RIGHT: - There is no evidence of deep vein thrombosis in the lower extremity. - There is no evidence of superficial venous thrombosis.  LEFT: - There is no evidence of deep vein thrombosis in the lower extremity. - There is no evidence of superficial venous thrombosis.  *See table(s) above for measurements and observations. Electronically signed by Monica Martinez MD on 10/17/2020 at 11:25:06 AM.    Final     Diagnostic Limited MSK Ultrasound  of: Bilateral calf area of bruising Normal-appearing no significant muscle or tendon defect.  No large hematoma or seroma visible. Impression: Bruising unclear cause.   Impression and Recommendations:    Assessment and Plan: 74 y.o. male with bilateral lower leg discomfort and bruising.  Unclear etiology.  Suspect muscle strain or overuse with increasing activity.  He takes Eliquis and is very vulnerable to easy bruising.  It is likely that either he injured the muscle a little bit and had more bruising visible because of the Eliquis or he had mild trauma to the legs and does not recognize it.  Either way he should improve with a bit of time and physical therapy.  He already is starting physical therapy for his back so we will add onto that.  Recheck back with me as needed.  Precautions reviewed.Marland Kitchen  PDMP not reviewed this encounter. Orders Placed This Encounter  Procedures  . [name] LIMITED JOINT SPACE STRUCTURES LOW BILAT(NO LINKED CHARGES)    Order Specific Question:   Reason for Exam (SYMPTOM  OR DIAGNOSIS REQUIRED)    Answer:   B calf pain    Order Specific Question:   Preferred imaging location?    Answer:   [place]  . Ambulatory referral to Physical Therapy    Referral Priority:   Routine    Referral Type:   Physical Medicine    Referral Reason:   Specialty Services Required    Requested Specialty:   Physical Therapy    Number of Visits Requested:   1   No orders of the defined types were placed in this encounter.   Discussed warning signs or symptoms. Please see discharge instructions. Patient expresses understanding.   The above documentation has been reviewed and is accurate and complete Lynne Leader, M.D.

## 2020-10-18 NOTE — Patient Instructions (Signed)
Thank you for coming in today. I am not 100% why you bruised and why you hurt.  I suspect that you mildly injured the muscles in the leg with your increased activity.  The bruising can also be a side effect of Eliquis.   Plan for PT.   Recheck with me if not improving or if worsening.  We can do more.

## 2020-10-24 DIAGNOSIS — R269 Unspecified abnormalities of gait and mobility: Secondary | ICD-10-CM | POA: Diagnosis not present

## 2020-10-24 DIAGNOSIS — Z789 Other specified health status: Secondary | ICD-10-CM | POA: Diagnosis not present

## 2020-10-24 DIAGNOSIS — M79662 Pain in left lower leg: Secondary | ICD-10-CM | POA: Diagnosis not present

## 2020-10-24 DIAGNOSIS — M79661 Pain in right lower leg: Secondary | ICD-10-CM | POA: Diagnosis not present

## 2020-10-24 DIAGNOSIS — S86919A Strain of unspecified muscle(s) and tendon(s) at lower leg level, unspecified leg, initial encounter: Secondary | ICD-10-CM | POA: Diagnosis not present

## 2020-10-24 DIAGNOSIS — Z7409 Other reduced mobility: Secondary | ICD-10-CM | POA: Diagnosis not present

## 2020-10-24 DIAGNOSIS — Z981 Arthrodesis status: Secondary | ICD-10-CM | POA: Diagnosis not present

## 2020-10-26 DIAGNOSIS — R269 Unspecified abnormalities of gait and mobility: Secondary | ICD-10-CM | POA: Diagnosis not present

## 2020-10-26 DIAGNOSIS — S86919A Strain of unspecified muscle(s) and tendon(s) at lower leg level, unspecified leg, initial encounter: Secondary | ICD-10-CM | POA: Diagnosis not present

## 2020-10-26 DIAGNOSIS — Z789 Other specified health status: Secondary | ICD-10-CM | POA: Diagnosis not present

## 2020-10-26 DIAGNOSIS — M79662 Pain in left lower leg: Secondary | ICD-10-CM | POA: Diagnosis not present

## 2020-10-26 DIAGNOSIS — M79661 Pain in right lower leg: Secondary | ICD-10-CM | POA: Diagnosis not present

## 2020-10-26 DIAGNOSIS — Z981 Arthrodesis status: Secondary | ICD-10-CM | POA: Diagnosis not present

## 2020-10-26 DIAGNOSIS — Z7409 Other reduced mobility: Secondary | ICD-10-CM | POA: Diagnosis not present

## 2020-10-31 DIAGNOSIS — R269 Unspecified abnormalities of gait and mobility: Secondary | ICD-10-CM | POA: Diagnosis not present

## 2020-10-31 DIAGNOSIS — Z7409 Other reduced mobility: Secondary | ICD-10-CM | POA: Diagnosis not present

## 2020-10-31 DIAGNOSIS — Z789 Other specified health status: Secondary | ICD-10-CM | POA: Diagnosis not present

## 2020-10-31 DIAGNOSIS — S86919A Strain of unspecified muscle(s) and tendon(s) at lower leg level, unspecified leg, initial encounter: Secondary | ICD-10-CM | POA: Diagnosis not present

## 2020-10-31 DIAGNOSIS — M79662 Pain in left lower leg: Secondary | ICD-10-CM | POA: Diagnosis not present

## 2020-10-31 DIAGNOSIS — Z981 Arthrodesis status: Secondary | ICD-10-CM | POA: Diagnosis not present

## 2020-10-31 DIAGNOSIS — M79661 Pain in right lower leg: Secondary | ICD-10-CM | POA: Diagnosis not present

## 2020-11-02 DIAGNOSIS — M79661 Pain in right lower leg: Secondary | ICD-10-CM | POA: Diagnosis not present

## 2020-11-02 DIAGNOSIS — Z981 Arthrodesis status: Secondary | ICD-10-CM | POA: Diagnosis not present

## 2020-11-02 DIAGNOSIS — Z789 Other specified health status: Secondary | ICD-10-CM | POA: Diagnosis not present

## 2020-11-02 DIAGNOSIS — Z7409 Other reduced mobility: Secondary | ICD-10-CM | POA: Diagnosis not present

## 2020-11-02 DIAGNOSIS — R269 Unspecified abnormalities of gait and mobility: Secondary | ICD-10-CM | POA: Diagnosis not present

## 2020-11-02 DIAGNOSIS — S86919A Strain of unspecified muscle(s) and tendon(s) at lower leg level, unspecified leg, initial encounter: Secondary | ICD-10-CM | POA: Diagnosis not present

## 2020-11-02 DIAGNOSIS — M79662 Pain in left lower leg: Secondary | ICD-10-CM | POA: Diagnosis not present

## 2020-11-07 DIAGNOSIS — Z981 Arthrodesis status: Secondary | ICD-10-CM | POA: Diagnosis not present

## 2020-11-07 DIAGNOSIS — R269 Unspecified abnormalities of gait and mobility: Secondary | ICD-10-CM | POA: Diagnosis not present

## 2020-11-07 DIAGNOSIS — M79661 Pain in right lower leg: Secondary | ICD-10-CM | POA: Diagnosis not present

## 2020-11-07 DIAGNOSIS — M79662 Pain in left lower leg: Secondary | ICD-10-CM | POA: Diagnosis not present

## 2020-11-07 DIAGNOSIS — S86919A Strain of unspecified muscle(s) and tendon(s) at lower leg level, unspecified leg, initial encounter: Secondary | ICD-10-CM | POA: Diagnosis not present

## 2020-11-07 DIAGNOSIS — Z789 Other specified health status: Secondary | ICD-10-CM | POA: Diagnosis not present

## 2020-11-07 DIAGNOSIS — Z7409 Other reduced mobility: Secondary | ICD-10-CM | POA: Diagnosis not present

## 2020-11-09 DIAGNOSIS — Z789 Other specified health status: Secondary | ICD-10-CM | POA: Diagnosis not present

## 2020-11-09 DIAGNOSIS — R269 Unspecified abnormalities of gait and mobility: Secondary | ICD-10-CM | POA: Diagnosis not present

## 2020-11-09 DIAGNOSIS — Z7409 Other reduced mobility: Secondary | ICD-10-CM | POA: Diagnosis not present

## 2020-11-09 DIAGNOSIS — Z981 Arthrodesis status: Secondary | ICD-10-CM | POA: Diagnosis not present

## 2020-11-09 DIAGNOSIS — S86919A Strain of unspecified muscle(s) and tendon(s) at lower leg level, unspecified leg, initial encounter: Secondary | ICD-10-CM | POA: Diagnosis not present

## 2020-11-10 ENCOUNTER — Encounter: Payer: Self-pay | Admitting: Family Medicine

## 2020-11-14 DIAGNOSIS — Z981 Arthrodesis status: Secondary | ICD-10-CM | POA: Diagnosis not present

## 2020-11-14 DIAGNOSIS — M79661 Pain in right lower leg: Secondary | ICD-10-CM | POA: Diagnosis not present

## 2020-11-14 DIAGNOSIS — M79662 Pain in left lower leg: Secondary | ICD-10-CM | POA: Diagnosis not present

## 2020-11-14 DIAGNOSIS — S86919A Strain of unspecified muscle(s) and tendon(s) at lower leg level, unspecified leg, initial encounter: Secondary | ICD-10-CM | POA: Diagnosis not present

## 2020-11-14 DIAGNOSIS — Z789 Other specified health status: Secondary | ICD-10-CM | POA: Diagnosis not present

## 2020-11-14 DIAGNOSIS — Z7409 Other reduced mobility: Secondary | ICD-10-CM | POA: Diagnosis not present

## 2020-11-14 DIAGNOSIS — R269 Unspecified abnormalities of gait and mobility: Secondary | ICD-10-CM | POA: Diagnosis not present

## 2020-11-15 NOTE — Patient Instructions (Addendum)
b12 is low normal- would be great to try oral cyanocobalamin/vitamin b12 as long as no rash with this like you got with injecitons  [facility] [name] [facility] of [facility]  No changes today

## 2020-11-15 NOTE — Progress Notes (Signed)
Phone [phone] In person visit   Subjective:   Bruce Ball is a 74 y.o. year old very pleasant male patient who presents for/with See problem oriented charting Chief Complaint  Patient presents with  . Hyperlipidemia  . Gastroesophageal Reflux  . Hypertension   This visit occurred during the SARS-CoV-2 public health emergency.  Safety protocols were in place, including screening questions prior to the visit, additional usage of staff PPE, and extensive cleaning of exam room while observing appropriate contact time as indicated for disinfecting solutions.   Past Medical History-  Patient Active Problem List   Diagnosis Date Noted  . Aortic atherosclerosis ([place]) 05/17/2020    Priority: High  . Idiopathic neuropathy 01/28/2020    Priority: High  . Recurrent falls 01/28/2020    Priority: High  . Pulmonary emboli ([place]) 01/14/2020    Priority: High  . High risk medication use 02/28/2017    Priority: High  . Ataxia 05/18/2016    Priority: High  . Vestibular disequilibrium 05/18/2016    Priority: High  . Ankylosing spondylitis ([place]) 05/24/2014    Priority: High  . B12 deficiency 05/17/2020    Priority: Medium  . Osteoporosis 04/04/2020    Priority: Medium  . Chronic kidney disease (CKD), stage III (moderate) ([place]) 01/19/2020    Priority: Medium  . GERD (gastroesophageal reflux disease) 12/22/2019    Priority: Medium  . Essential tremor 12/22/2019    Priority: Medium  . Hyperlipidemia 11/11/2017    Priority: Medium  . Right ureteral stone 11/05/2017    Priority: Medium  . History of prostate cancer 03/08/2017    Priority: Medium  . Hx of chondrosarcoma 02/28/2017    Priority: Medium  . Peripheral edema 06/08/2012    Priority: Medium  . Extrinsic asthma 04/07/2009    Priority: Medium  . LOW BACK PAIN 04/21/2008    Priority: Medium  . History of peptic ulcer disease 04/21/2008    Priority: Medium  . Essential hypertension 10/29/2007    Priority: Medium  .  Acute pain of right knee 07/15/2019    Priority: Low  . Community acquired pneumonia of right upper lobe of lung 11/05/2017    Priority: Low  . History of total right hip replacement 03/08/2017    Priority: Low  . DDD (degenerative disc disease), thoracic 02/28/2017    Priority: Low  . Lung nodule, solitary 05/24/2014    Priority: Low  . Thin skin 07/25/2020  . Iliac artery aneurysm ([place]) 05/17/2020  . Gross hematuria 06/14/2019    Medications- reviewed and updated Current Outpatient Medications  Medication Sig Dispense Refill  . amLODipine (NORVASC) 2.5 MG tablet Take 2.5 mg by mouth daily.    Marland Kitchen apixaban (ELIQUIS) 5 MG TABS tablet Take 2 tablets (10mg ) twice daily for 7 days, then 1 tablet (5mg ) twice daily (Patient taking differently: Take 5 mg by mouth 2 (two) times daily. ) 60 tablet 1  . atorvastatin (LIPITOR) 20 MG tablet Take 20 mg at bedtime by mouth.     . carvedilol (COREG) 25 MG tablet Take 25 mg 2 (two) times daily by mouth.     . Cholecalciferol (VITAMIN D3) 50 MCG (2000 UT) capsule Take 2,000 Units by mouth in the morning and at bedtime.     . indapamide (LOZOL) 1.25 MG tablet Take 1.25 mg by mouth daily.    . isosorbide mononitrate (IMDUR) 30 MG 24 hr tablet Take 30 mg by mouth daily.     . metoCLOPramide (REGLAN) 5 MG tablet  TAKE 1 TABLET (5 MG TOTAL) BY MOUTH 3 (THREE) TIMES DAILY BEFORE MEALS. 90 tablet 1  . omeprazole (PRILOSEC) 20 MG capsule Take 40 mg by mouth 2 (two) times daily.    . predniSONE (DELTASONE) 5 MG tablet Take 5 mg by mouth daily with breakfast.     No current facility-administered medications for this visit.     Objective:  BP 128/68   Pulse 71   Temp 97.7 F (36.5 C) (Temporal)   Resp 18   Ht 5\' 11"  (1.803 m)   Wt 239 lb 6.4 oz (108.6 kg)   SpO2 97%   BMI 33.39 kg/m  Gen: NAD, resting comfortably CV: RRR no murmurs rubs or gallops Lungs: CTAB no crackles, wheeze, rhonchi Abdomen: soft/nontender/nondistended/normal bowel sounds. No  rebound or guarding.  Ext: trace edema- not nearly as tender on lower extremites Skin: warm, dry Neuro: was in wheelchair from pain last visit but freely moves around the room today and outside of hip discomfort appears very comfortable.     Assessment and Plan   #hyperlipidemia/aortic atherosclerosis S: Medication:Atorvastatin 20Mg , not on asa as on eliquis Lab Results  Component Value Date   CHOL 104 11/30/2019   HDL 56 11/30/2019   LDLCALC 38 11/30/2019   LDLDIRECT 145.4 06/27/2011   TRIG 49 11/30/2019   CHOLHDL 4.5 05/18/2016   A/P: LDL has been very well controlled with LDL under 70-continue current medication-this is ideal given patient has aortic atherosclerosis as well as some coronary calcium. -[facility] -mild SOB with activity getting better as continues with PT   #hypertension/CKD stage III S: medication: IMDUR 30Mg , amlodipine 2.5Mg  , carvedilol 25Mg  twice daily, indapamide 1.25 mg BP Readings from Last 3 Encounters:  11/17/20 128/68  10/18/20 114/72  10/16/20 120/64  A/P: Stable blood pressure-continue current medication  For chronic kidney disease stage III-has been stable on labs with the [place].also stable in July with Cr 1.42 and GFR of 48- pretty stable.  #Chronic back pain-moderately large extruded disc fragment right L5-S1 with compression of the right L5 nerve root with significant progression on MRI April 2021 S: At last visit-see note May 17, 2020-patient was seeking second opinion of Dr. Gerilyn Nestle with [facility].  Today patient still using rolling walker and doing better-no recent falls. Off gabapentin and not even taking tylenol!   He ended up having surgery and is doing really well after lumbar fusion. [facility] in [facility]. Pain runs from 0-3/10 down from 8-9/10. He has had no falls. More mobile in general as a result.  A/P: chronic back pain in much better position- continue follow up with PT through end of  december and [facility]  - sees [place] [facility]ology in January and may be taken off of prednisone since back in better position- was using it for pain in back.   #PE/left leg DVT-discovered January 13, 2020-plan was at least for 6 months of Eliquis but since this was thought provoked secondary to sedentary activity-likely needs lifelong therapy unless becomes more mobile. Plan right now with him being more mobile is to continue through end of the year and potentially consider stopping at that point with hematology- has visit in January with labwork.   #Low normal B12-have recommended patient take oral B12 daily.  Have rash with B12 injections in the past.  Lab Results  Component Value Date   VITAMINB12 298 02/07/2020  from [facility] "b12 is low normal- would be  great to try oral cyanocobalamin/vitamin b12 as long as no rash with this like you got with injecitons"  Recommended follow up: Return in about 6 months (around 05/17/2021) for follow up- or sooner if needed. Future Appointments  Date Time Provider [place]  01/25/2021 11:15 AM [facility] None  01/25/2021 11:40 AM [name], MD [facility] None    Lab/Order associations:   ICD-10-CM   1. Essential hypertension  I10   2. Hyperlipidemia, unspecified hyperlipidemia type  E78.5   3. Other acute pulmonary embolism without acute cor pulmonale (HCC)  I26.99   4. Aortic atherosclerosis (HCC)  I70.0   5. Stage 3 chronic kidney disease, unspecified whether stage 3a or 3b CKD ([place])  N18.30    Return precautions advised.  [name], MD

## 2020-11-16 DIAGNOSIS — M79661 Pain in right lower leg: Secondary | ICD-10-CM | POA: Diagnosis not present

## 2020-11-16 DIAGNOSIS — R269 Unspecified abnormalities of gait and mobility: Secondary | ICD-10-CM | POA: Diagnosis not present

## 2020-11-16 DIAGNOSIS — Z981 Arthrodesis status: Secondary | ICD-10-CM | POA: Diagnosis not present

## 2020-11-16 DIAGNOSIS — S86919A Strain of unspecified muscle(s) and tendon(s) at lower leg level, unspecified leg, initial encounter: Secondary | ICD-10-CM | POA: Diagnosis not present

## 2020-11-16 DIAGNOSIS — Z789 Other specified health status: Secondary | ICD-10-CM | POA: Diagnosis not present

## 2020-11-16 DIAGNOSIS — M79662 Pain in left lower leg: Secondary | ICD-10-CM | POA: Diagnosis not present

## 2020-11-16 DIAGNOSIS — Z7409 Other reduced mobility: Secondary | ICD-10-CM | POA: Diagnosis not present

## 2020-11-17 ENCOUNTER — Encounter: Payer: Self-pay | Admitting: Family Medicine

## 2020-11-17 ENCOUNTER — Ambulatory Visit (INDEPENDENT_AMBULATORY_CARE_PROVIDER_SITE_OTHER): Payer: Medicare Other | Admitting: Family Medicine

## 2020-11-17 ENCOUNTER — Other Ambulatory Visit: Payer: Self-pay

## 2020-11-17 VITALS — BP 128/68 | HR 71 | Temp 97.7°F | Resp 18 | Ht 71.0 in | Wt 239.4 lb

## 2020-11-17 DIAGNOSIS — N183 Chronic kidney disease, stage 3 unspecified: Secondary | ICD-10-CM

## 2020-11-17 DIAGNOSIS — I2699 Other pulmonary embolism without acute cor pulmonale: Secondary | ICD-10-CM | POA: Diagnosis not present

## 2020-11-17 DIAGNOSIS — E538 Deficiency of other specified B group vitamins: Secondary | ICD-10-CM

## 2020-11-17 DIAGNOSIS — I10 Essential (primary) hypertension: Secondary | ICD-10-CM | POA: Diagnosis not present

## 2020-11-17 DIAGNOSIS — E785 Hyperlipidemia, unspecified: Secondary | ICD-10-CM

## 2020-11-17 DIAGNOSIS — I700 Atherosclerosis of aorta: Secondary | ICD-10-CM | POA: Diagnosis not present

## 2020-11-17 DIAGNOSIS — K219 Gastro-esophageal reflux disease without esophagitis: Secondary | ICD-10-CM

## 2020-11-21 DIAGNOSIS — M79662 Pain in left lower leg: Secondary | ICD-10-CM | POA: Diagnosis not present

## 2020-11-21 DIAGNOSIS — R269 Unspecified abnormalities of gait and mobility: Secondary | ICD-10-CM | POA: Diagnosis not present

## 2020-11-21 DIAGNOSIS — Z981 Arthrodesis status: Secondary | ICD-10-CM | POA: Diagnosis not present

## 2020-11-21 DIAGNOSIS — M79661 Pain in right lower leg: Secondary | ICD-10-CM | POA: Diagnosis not present

## 2020-11-21 DIAGNOSIS — Z789 Other specified health status: Secondary | ICD-10-CM | POA: Diagnosis not present

## 2020-11-21 DIAGNOSIS — Z7409 Other reduced mobility: Secondary | ICD-10-CM | POA: Diagnosis not present

## 2020-11-21 DIAGNOSIS — S86919A Strain of unspecified muscle(s) and tendon(s) at lower leg level, unspecified leg, initial encounter: Secondary | ICD-10-CM | POA: Diagnosis not present

## 2020-11-24 ENCOUNTER — Emergency Department (HOSPITAL_COMMUNITY)
Admission: EM | Admit: 2020-11-24 | Discharge: 2020-11-24 | Disposition: A | Discharge status: Home / Self Care | Payer: Medicare Other | Attending: Emergency Medicine | Admitting: Emergency Medicine

## 2020-11-24 ENCOUNTER — Emergency Department (HOSPITAL_COMMUNITY): Payer: Medicare Other

## 2020-11-24 ENCOUNTER — Other Ambulatory Visit: Payer: Self-pay

## 2020-11-24 DIAGNOSIS — S0990XA Unspecified injury of head, initial encounter: Secondary | ICD-10-CM | POA: Diagnosis not present

## 2020-11-24 DIAGNOSIS — S20219A Contusion of unspecified front wall of thorax, initial encounter: Secondary | ICD-10-CM | POA: Diagnosis not present

## 2020-11-24 DIAGNOSIS — S0003XA Contusion of scalp, initial encounter: Secondary | ICD-10-CM | POA: Diagnosis not present

## 2020-11-24 DIAGNOSIS — W010XXA Fall on same level from slipping, tripping and stumbling without subsequent striking against object, initial encounter: Secondary | ICD-10-CM | POA: Insufficient documentation

## 2020-11-24 DIAGNOSIS — Z87891 Personal history of nicotine dependence: Secondary | ICD-10-CM | POA: Diagnosis not present

## 2020-11-24 DIAGNOSIS — S7001XA Contusion of right hip, initial encounter: Secondary | ICD-10-CM | POA: Diagnosis not present

## 2020-11-24 DIAGNOSIS — Z043 Encounter for examination and observation following other accident: Secondary | ICD-10-CM | POA: Diagnosis not present

## 2020-11-24 DIAGNOSIS — S299XXA Unspecified injury of thorax, initial encounter: Secondary | ICD-10-CM | POA: Diagnosis present

## 2020-11-24 DIAGNOSIS — I129 Hypertensive chronic kidney disease with stage 1 through stage 4 chronic kidney disease, or unspecified chronic kidney disease: Secondary | ICD-10-CM | POA: Insufficient documentation

## 2020-11-24 DIAGNOSIS — I2510 Atherosclerotic heart disease of native coronary artery without angina pectoris: Secondary | ICD-10-CM | POA: Diagnosis not present

## 2020-11-24 DIAGNOSIS — T07XXXA Unspecified multiple injuries, initial encounter: Secondary | ICD-10-CM

## 2020-11-24 DIAGNOSIS — W19XXXA Unspecified fall, initial encounter: Secondary | ICD-10-CM | POA: Diagnosis not present

## 2020-11-24 DIAGNOSIS — R519 Headache, unspecified: Secondary | ICD-10-CM | POA: Diagnosis not present

## 2020-11-24 DIAGNOSIS — M25572 Pain in left ankle and joints of left foot: Secondary | ICD-10-CM | POA: Diagnosis not present

## 2020-11-24 DIAGNOSIS — S5002XA Contusion of left elbow, initial encounter: Secondary | ICD-10-CM | POA: Diagnosis not present

## 2020-11-24 DIAGNOSIS — M25551 Pain in right hip: Secondary | ICD-10-CM | POA: Diagnosis not present

## 2020-11-24 DIAGNOSIS — S300XXA Contusion of lower back and pelvis, initial encounter: Secondary | ICD-10-CM | POA: Diagnosis not present

## 2020-11-24 DIAGNOSIS — N183 Chronic kidney disease, stage 3 unspecified: Secondary | ICD-10-CM | POA: Diagnosis not present

## 2020-11-24 DIAGNOSIS — S7002XA Contusion of left hip, initial encounter: Secondary | ICD-10-CM | POA: Insufficient documentation

## 2020-11-24 DIAGNOSIS — S2020XA Contusion of thorax, unspecified, initial encounter: Secondary | ICD-10-CM | POA: Diagnosis not present

## 2020-11-24 DIAGNOSIS — J45909 Unspecified asthma, uncomplicated: Secondary | ICD-10-CM | POA: Diagnosis not present

## 2020-11-24 DIAGNOSIS — Z79899 Other long term (current) drug therapy: Secondary | ICD-10-CM | POA: Insufficient documentation

## 2020-11-24 DIAGNOSIS — R52 Pain, unspecified: Secondary | ICD-10-CM | POA: Diagnosis not present

## 2020-11-24 DIAGNOSIS — M549 Dorsalgia, unspecified: Secondary | ICD-10-CM | POA: Diagnosis not present

## 2020-11-24 NOTE — Discharge Instructions (Addendum)
All the imaging results are normal, no fractures seen. No evidence of brain bleed. Please be very careful with walking, and do everything possible to prevent falls.

## 2020-11-24 NOTE — ED Triage Notes (Signed)
Pt arrives via [facility] from physical therapy. Pt reports a mechanical fall just PTA. Pt reports pain in back, neck, bilateral hips, right flank, and left elbow. Pt denies head trauma or LOC. Pt is on blood thinners.

## 2020-11-24 NOTE — ED Provider Notes (Signed)
[facility] DEPT Provider Note   CSN: [id number] Arrival date & time: 11/24/20  1116     History Chief Complaint  Patient presents with  . Fall    Bruce Ball is a 74 y.o. male.  HPI     74 year old male comes in a chief complaint of fall.  Patient was walking into his physical therapy appointment when he tripped and fell.  Patient is on Eliquis.  He fell onto his right side and is having pain over the right lateral thoracic region.  He is also complaining of pain in his left elbow, both hips.  Patient denies headaches, neck pain.  No loss of consciousness, nausea, vomiting.  Past Medical History:  Diagnosis Date  . CAD (coronary artery disease)   . Chronic back pain greater than 3 months duration   . Chronic leg pain   . Deafness in right ear   . Dyspnea    cause by PE  . Extrinsic asthma, unspecified 04/07/2009   PT DENIES  . GERD (gastroesophageal reflux disease)   . History of kidney stones   . HYPERTENSION 10/29/2007  . HYPOTENSION 08/13/2010  . LOW BACK PAIN 04/21/2008  . NEPHROLITHIASIS, HX OF 04/21/2008  . Pneumonia   . PUD, HX OF 04/21/2008  . Pulmonary emboli ([place]) 2021   noted on CT 12-2019  . Renal disorder     Patient Active Problem List   Diagnosis Date Noted  . Thin skin 07/25/2020  . Aortic atherosclerosis ([place]) 05/17/2020  . B12 deficiency 05/17/2020  . Iliac artery aneurysm ([place]) 05/17/2020  . Osteoporosis 04/04/2020  . Idiopathic neuropathy 01/28/2020  . Recurrent falls 01/28/2020  . Chronic kidney disease (CKD), stage III (moderate) ([place]) 01/19/2020  . Pulmonary emboli ([place]) 01/14/2020  . GERD (gastroesophageal reflux disease) 12/22/2019  . Essential tremor 12/22/2019  . Acute pain of right knee 07/15/2019  . Gross hematuria 06/14/2019  . Hyperlipidemia 11/11/2017  . Community acquired pneumonia of right upper lobe of lung 11/05/2017  . Right ureteral stone 11/05/2017  . History of total right hip  replacement 03/08/2017  . History of prostate cancer 03/08/2017  . 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 High risk medication use 02/28/2017  . DDD (degenerative disc disease), thoracic 02/28/2017  . Ataxia 05/18/2016  . Vestibular disequilibrium 05/18/2016  . Ankylosing spondylitis ([place]) 05/24/2014  . Lung nodule, solitary 05/24/2014  . Peripheral edema 06/08/2012  . Extrinsic asthma 04/07/2009  . LOW BACK PAIN 04/21/2008  . History of peptic ulcer disease 04/21/2008  . Essential hypertension 10/29/2007    Past Surgical History:  Procedure Laterality Date  . BACK SURGERY    . bone cancer  1970   rt femur removed and steel rod placed  . CYSTOSCOPY WITH RETROGRADE PYELOGRAM, URETEROSCOPY AND STENT PLACEMENT Right 11/06/2017   Procedure: CYSTOSCOPY WITH  STENT PLACEMENT RIGHT;  Surgeon: Raynelle Bring, MD;  Location: [facility];  Service: Urology;  Laterality: Right;  . CYSTOSCOPY WITH RETROGRADE PYELOGRAM, URETEROSCOPY AND STENT PLACEMENT Left 06/09/2019   Procedure: CYSTOSCOPY WITH RETROGRADE PYELOGRAM, URETEROSCOPY AND STENT PLACEMENT X2;  Surgeon: [name], MD;  Location: [facility];  Service: Urology;  Laterality: Left;  . CYSTOSCOPY WITH RETROGRADE PYELOGRAM, URETEROSCOPY AND STENT PLACEMENT Bilateral 03/01/2020   Procedure: CYSTOSCOPY WITH RETROGRADE PYELOGRAM, URETEROSCOPY AND STENT PLACEMENT;  Surgeon: [name], MD;  Location: [facility];  Service: Urology;  Laterality: Bilateral;  75 MINS  . CYSTOSCOPY/RETROGRADE/URETEROSCOPY     1 year ago at [facility] in [place]  .  CYSTOSCOPY/URETEROSCOPY/HOLMIUM LASER Right 11/24/2017   Procedure: CYSTOSCOPY/URETEROSCOPY/ RETROGRADE/STENT REMOVAL;  Surgeon: Raynelle Bring, MD;  Location: [facility];  Service: Urology;  Laterality: Right;  . HOLMIUM LASER APPLICATION Bilateral 09/07/2425   Procedure: HOLMIUM LASER APPLICATION;  Surgeon: [name], MD;  Location: [facility];  Service: Urology;  Laterality: Bilateral;  . prosatectomy         Family  History  Problem Relation Age of Onset  . COPD Father   . Cancer Other        prostate  . Prostate cancer Brother   . Esophageal cancer Neg Hx   . Stomach cancer Neg Hx   . Pancreatic cancer Neg Hx   . Colon cancer Neg Hx     Social History   Tobacco Use  . Smoking status: Former Smoker    Packs/day: 1.00    Years: 17.00    Pack years: 17.00    Start date: 1963    Quit date: 12/30/1978    Years since quitting: 41.9  . Smokeless tobacco: Never Used  Vaping Use  . Vaping Use: Never used  Substance Use Topics  . Alcohol use: No  . Drug use: No    Home Medications Prior to Admission medications   Medication Sig Start Date End Date Taking? Authorizing Provider  amLODipine (NORVASC) 2.5 MG tablet Take 2.5 mg by mouth daily.    [provider]  apixaban (ELIQUIS) 5 MG TABS tablet Take 2 tablets (10mg ) twice daily for 7 days, then 1 tablet (5mg ) twice daily Patient taking differently: Take 5 mg by mouth 2 (two) times daily.  01/16/20   Georgette Shell, MD  atorvastatin (LIPITOR) 20 MG tablet Take 20 mg at bedtime by mouth.     [provider]  carvedilol (COREG) 25 MG tablet Take 25 mg 2 (two) times daily by mouth.     [provider]  Cholecalciferol (VITAMIN D3) 50 MCG (2000 UT) capsule Take 2,000 Units by mouth in the morning and at bedtime.     [provider]  indapamide (LOZOL) 1.25 MG tablet Take 1.25 mg by mouth daily. 03/09/20   [provider]  isosorbide mononitrate (IMDUR) 30 MG 24 hr tablet Take 30 mg by mouth daily.     [provider]  metoCLOPramide (REGLAN) 5 MG tablet TAKE 1 TABLET (5 MG TOTAL) BY MOUTH 3 (THREE) TIMES DAILY BEFORE MEALS. 04/24/20   Armbruster, Carlota Raspberry, MD  omeprazole (PRILOSEC) 20 MG capsule Take 40 mg by mouth 2 (two) times daily.    [provider]  predniSONE (DELTASONE) 5 MG tablet Take 5 mg by mouth daily with breakfast.    [provider]    Allergies    Patient  has no known allergies.  Review of Systems   Review of Systems  Constitutional: Positive for activity change.  Respiratory: Negative for shortness of breath.   Cardiovascular: Negative for chest pain.  Gastrointestinal: Negative for nausea and vomiting.  Skin: Positive for wound.  Hematological: Bruises/bleeds easily.  All other systems reviewed and are negative.   Physical Exam Updated Vital Signs BP (!) 189/102 (BP Location: Left Arm)   Pulse (!) 58   Temp 98.3 F (36.8 C) (Oral)   Resp 16   Wt 108.9 kg   SpO2 97%   BMI 33.47 kg/m   Physical Exam Vitals and nursing note reviewed.  Constitutional:      Appearance: He is well-developed.  HENT:     Head: Atraumatic.  Eyes:     Extraocular Movements: Extraocular movements intact.     Pupils: Pupils are equal, round, and reactive to light.  Neck:     Comments: No midline c-spine tenderness, pt able to turn head to 45 degrees bilaterally without any pain and able to flex neck to the chest and extend without any pain or neurologic symptoms.  Cardiovascular:     Rate and Rhythm: Normal rate.  Pulmonary:     Effort: Pulmonary effort is normal.  Musculoskeletal:     Cervical back: Neck supple.     Comments: Patient has tenderness over bilateral hips, left elbow, right lateral thoracic region.  No deep lacerations.  Patient is able to flex over the elbow and bilateral hip.  Otherwise:  Head to toe evaluation shows no hematoma, bleeding of the scalp, no facial abrasions, no spine step offs, crepitus of the chest or neck, no tenderness to palpation of the bilateral upper and lower extremities, no gross deformities, no chest tenderness, no pelvic pain.   Skin:    General: Skin is warm.  Neurological:     Mental Status: He is alert and oriented to person, place, and time.     ED Results / Procedures / Treatments   Labs (all labs ordered are listed, but only abnormal results are displayed) Labs Reviewed - No data to  display  EKG None  Radiology DG Ribs Unilateral W/Chest Right  Result Date: 11/24/2020 CLINICAL DATA:  Fall. EXAM: RIGHT RIBS AND CHEST - 3+ VIEW COMPARISON:  CT 04/02/2020. FINDINGS: No evidence of displaced rib fracture or pneumothorax. No acute cardiopulmonary disease. Calcified nodular density left lung consistent with calcified granuloma. Right nephrolithiasis. IMPRESSION: 1. No evidence of displaced rib fracture or pneumothorax. 2. Right nephrolithiasis. Electronically Signed   By: Marcello Moores  Register   On: 11/24/2020 12:43   DG Thoracic Spine 2 View  Result Date: 11/24/2020 CLINICAL DATA:  Fall. EXAM: THORACIC SPINE 2 VIEWS COMPARISON:  CT chest dated April 02, 2020. FINDINGS: Twelve rib-bearing thoracic vertebral bodies. No acute fracture or subluxation. Vertebral body heights are preserved. Alignment is normal. Intervertebral disc spaces are maintained. IMPRESSION: Negative. Electronically Signed   By: Titus Dubin M.D.   On: 11/24/2020 12:45   DG Pelvis 1-2 Views  Result Date: 11/24/2020 CLINICAL DATA:  Fall.  Right hip pain. EXAM: PELVIS - 1-2 VIEW COMPARISON:  CT abdomen pelvis dated April 02, 2020. FINDINGS: There is no evidence of pelvic fracture or diastasis. Right hip unipolar arthroplasty again noted. Interval L5-S1 fusion. IMPRESSION: 1. No acute osseous abnormality. Electronically Signed   By: Titus Dubin M.D.   On: 11/24/2020 12:47   CT Head Wo Contrast  Result Date: 11/24/2020 CLINICAL DATA:  74 year old male status post fall.  Pain. EXAM: CT HEAD WITHOUT CONTRAST TECHNIQUE: Contiguous axial images were obtained from the base of the skull through the vertex without intravenous contrast. COMPARISON:  Brain MRI 11/16/2019.  Head CT 04/02/2020. FINDINGS: Brain: Cerebral volume remains stable. No midline shift, ventriculomegaly, mass effect, evidence of mass lesion, intracranial hemorrhage or evidence of cortically based acute infarction. Gray-white matter differentiation  is within normal limits throughout the brain. No definite cortical encephalomalacia. Patchy bilateral white matter hypodensity appears stable. Vascular: Calcified atherosclerosis at the skull base. Pronounced MCA M1 calcified atherosclerosis again noted greater on the right. No suspicious intracranial vascular hyperdensity. Skull: Stable.  No acute osseous abnormality identified. Sinuses/Orbits: Visualized paranasal sinuses and mastoids are clear. Other: No acute orbit or scalp soft tissue injury  identified. IMPRESSION: 1. No acute intracranial abnormality or acute traumatic injury identified. 2. Stable non contrast CT appearance of white matter disease most commonly due to small vessel ischemia. Electronically Signed   By: Genevie Ann M.D.   On: 11/24/2020 13:03    Procedures Procedures (including critical care time)  Medications Ordered in ED Medications - No data to display  ED Course  I have reviewed the triage vital signs and the nursing notes.  Pertinent labs & imaging results that were available during my care of the patient were reviewed by me and considered in my medical decision making (see chart for details).    MDM Rules/Calculators/A&P                          74 year old comes in a chief complaint of fall. Patient is having pain over multiple different parts.  Radiographs ordered.  CT head ordered since patient is on Eliquis.  1:39 PM Results of the x-rays reviewed by me independently.  No evidence of pneumothorax, hip fracture.  CT head not showing bleed.  Stable for discharge.  Final Clinical Impression(s) / ED Diagnoses Final diagnoses:  Fall, initial encounter  Multiple contusions    Rx / DC Orders ED Discharge Orders    None       Varney Biles, MD 11/24/20 1339

## 2020-11-30 DIAGNOSIS — R269 Unspecified abnormalities of gait and mobility: Secondary | ICD-10-CM | POA: Diagnosis not present

## 2020-11-30 DIAGNOSIS — Z7409 Other reduced mobility: Secondary | ICD-10-CM | POA: Diagnosis not present

## 2020-11-30 DIAGNOSIS — Z789 Other specified health status: Secondary | ICD-10-CM | POA: Diagnosis not present

## 2020-11-30 DIAGNOSIS — M79662 Pain in left lower leg: Secondary | ICD-10-CM | POA: Diagnosis not present

## 2020-11-30 DIAGNOSIS — M79661 Pain in right lower leg: Secondary | ICD-10-CM | POA: Diagnosis not present

## 2020-11-30 DIAGNOSIS — S86919A Strain of unspecified muscle(s) and tendon(s) at lower leg level, unspecified leg, initial encounter: Secondary | ICD-10-CM | POA: Diagnosis not present

## 2020-11-30 DIAGNOSIS — Z981 Arthrodesis status: Secondary | ICD-10-CM | POA: Diagnosis not present

## 2020-12-04 DIAGNOSIS — R109 Unspecified abdominal pain: Secondary | ICD-10-CM | POA: Diagnosis not present

## 2020-12-04 DIAGNOSIS — M48061 Spinal stenosis, lumbar region without neurogenic claudication: Secondary | ICD-10-CM | POA: Diagnosis not present

## 2020-12-04 DIAGNOSIS — M549 Dorsalgia, unspecified: Secondary | ICD-10-CM | POA: Diagnosis not present

## 2020-12-04 DIAGNOSIS — Z981 Arthrodesis status: Secondary | ICD-10-CM | POA: Diagnosis not present

## 2020-12-04 DIAGNOSIS — M25521 Pain in right elbow: Secondary | ICD-10-CM | POA: Diagnosis not present

## 2020-12-04 DIAGNOSIS — W19XXXD Unspecified fall, subsequent encounter: Secondary | ICD-10-CM | POA: Diagnosis not present

## 2020-12-04 DIAGNOSIS — M25551 Pain in right hip: Secondary | ICD-10-CM | POA: Diagnosis not present

## 2020-12-11 NOTE — Progress Notes (Signed)
Phone [phone] In person visit   Subjective:   Bruce Ball is a 74 y.o. year old very pleasant male patient who presents for/with See problem oriented charting Chief Complaint  Patient presents with  . Bleeding from left ear    Patient's wife mentioned that she noticed some spots of blood on his pillow case. He also stated that he pulls clots of blood out of his ear. He noticed it was bleeding the other day.    This visit occurred during the SARS-CoV-2 public health emergency.  Safety protocols were in place, including screening questions prior to the visit, additional usage of staff PPE, and extensive cleaning of exam room while observing appropriate contact time as indicated for disinfecting solutions.   Past Medical History-  Patient Active Problem List   Diagnosis Date Noted  . Aortic atherosclerosis ([place]) 05/17/2020    Priority: High  . Idiopathic neuropathy 01/28/2020    Priority: High  . Recurrent falls 01/28/2020    Priority: High  . Pulmonary emboli ([place]) 01/14/2020    Priority: High  . High risk medication use 02/28/2017    Priority: High  . Ataxia 05/18/2016    Priority: High  . Vestibular disequilibrium 05/18/2016    Priority: High  . Ankylosing spondylitis ([place]) 05/24/2014    Priority: High  . B12 deficiency 05/17/2020    Priority: Medium  . Iliac artery aneurysm ([place]) 05/17/2020    Priority: Medium  . Osteoporosis 04/04/2020    Priority: Medium  . Chronic kidney disease (CKD), stage III (moderate) ([place]) 01/19/2020    Priority: Medium  . GERD (gastroesophageal reflux disease) 12/22/2019    Priority: Medium  . Essential tremor 12/22/2019    Priority: Medium  . Hyperlipidemia 11/11/2017    Priority: Medium  . Right ureteral stone 11/05/2017    Priority: Medium  . History of prostate cancer 03/08/2017    Priority: Medium  . Hx of chondrosarcoma 02/28/2017    Priority: Medium  . Peripheral edema 06/08/2012    Priority: Medium  . Extrinsic  asthma 04/07/2009    Priority: Medium  . LOW BACK PAIN 04/21/2008    Priority: Medium  . History of peptic ulcer disease 04/21/2008    Priority: Medium  . Essential hypertension 10/29/2007    Priority: Medium  . Acquired coagulation disorder ([place]) 12/12/2020    Priority: Low  . Senile purpura ([place]) 12/12/2020    Priority: Low  . Acute pain of right knee 07/15/2019    Priority: Low  . Community acquired pneumonia of right upper lobe of lung 11/05/2017    Priority: Low  . History of total right hip replacement 03/08/2017    Priority: Low  . DDD (degenerative disc disease), thoracic 02/28/2017    Priority: Low  . Lung nodule, solitary 05/24/2014    Priority: Low  . Thin skin 07/25/2020  . Gross hematuria 06/14/2019    Medications- reviewed and updated Current Outpatient Medications  Medication Sig Dispense Refill  . amLODipine (NORVASC) 2.5 MG tablet Take 2.5 mg by mouth daily.    Marland Kitchen apixaban (ELIQUIS) 5 MG TABS tablet Take 2 tablets (10mg ) twice daily for 7 days, then 1 tablet (5mg ) twice daily (Patient taking differently: Take 5 mg by mouth 2 (two) times daily.) 60 tablet 1  . atorvastatin (LIPITOR) 20 MG tablet Take 20 mg at bedtime by mouth.     . carvedilol (COREG) 25 MG tablet Take 25 mg by mouth 2 (two) times daily.    . Cholecalciferol (VITAMIN  D3) 50 MCG (2000 UT) capsule Take 2,000 Units by mouth in the morning and at bedtime.     . indapamide (LOZOL) 1.25 MG tablet Take 1.25 mg by mouth daily.    . isosorbide mononitrate (IMDUR) 30 MG 24 hr tablet Take 30 mg by mouth daily.     . metoCLOPramide (REGLAN) 5 MG tablet TAKE 1 TABLET (5 MG TOTAL) BY MOUTH 3 (THREE) TIMES DAILY BEFORE MEALS. 90 tablet 1  . omeprazole (PRILOSEC) 20 MG capsule Take 40 mg by mouth 2 (two) times daily.    . predniSONE (DELTASONE) 5 MG tablet Take 5 mg by mouth daily with breakfast.     No current facility-administered medications for this visit.     Objective:  BP 132/72   Pulse 65   Temp  (!) 97.3 F (36.3 C) (Temporal)   Resp 18   Ht 5\' 11"  (1.803 m)   Wt 238 lb 9.6 oz (108.2 kg)   SpO2 95%   BMI 33.28 kg/m  Gen: NAD, resting comfortably in right ear canal there is slight excoriation with slight amount of dried blood. In the left ear there is more extensive excoriation with more extensive dried blood. No active bleeding noted. Tympanic membrane normal bilaterally without any source of bleeding or rupture CV: RRR no murmurs rubs or gallops Lungs: CTAB no crackles, wheeze, rhonchi Ext: no edema Skin: warm, dry     Assessment and Plan  Bleeding in left Ear S: ear issues started last Thursday. Noted streaming of blood that got into his hearing. Has been having trouble hearing without aid- not sure if worse than baseline since hasnt had aid but leans toward it being worse. Intermittent bleeding through Sunday then Sunday by noon was able to pull a blood clot out size of fingernail. Prior to last week had noted intermittent spots of blood for last 2 months at least per wife. No nasal congestion or other issues   Anticoagulants-patient compliant with Eliquis 5 mg twice daily for history of pulmonary embolism/left leg DVT-has a visit in January with hematology to consider stopping since mobility has significantly increased and this was considered provoked by immobility  Does use q tips A/P: Patient in bilateral ear canals with excoriation from using Q-tips. Strongly advised against using Q-tips. If patient feels like he has issues with wax can try mineral oil in the future again directions on this. No active bleeding or need for cauterization. If he has recurrent issues we could certainly refer to ENT but I think he will do much better with avoiding Q-tips.  We also discussed with his improve mobility hopefully he can get off Eliquis which will decrease risk of bleeding as well. No evidence at present of recurrent pulmonary embolism or DVT. We will continue Eliquis 5 mg twice  daily  #Nonhealing nonsurgical wound on left scalp-patient reports he has a wound on his left scalp that has not healed over the last 1 to 2 years-he states this started even before he was on Eliquis. He is seen dermatology in the past with the [place] but can take up to 9 months to get in-he would like a private referral as we discussed potential for skin cancer-referral placed to [facility] dermatology today  #fell going into proehlific park day after thanksgiving- thankfully no major injury. Later right knee started causing issues- back on walker due to knee but back ok.   #Anemia-patient was told he has mild anemia of the [place]. Cologuard through [place] in the  past and now due for repeat-I encouraged him to complete this. Obviously Eliquis could increase risk of anemia-he had a fair amount of bleeding from his ear and anytime he gets a cut he bleeds a fair amount (senile purpura-stable but bothersome) --he was told this was mild at the [place] and since this was recently checked we opted not to recheck at this time-he can sign a release of information for [name] to get these records. In addition has follow-up with hematology with blood work next month.  Recommended follow up: Discussed potential 77-month follow-up Future Appointments  Date Time Provider [place]  01/25/2021 11:15 AM [facility] None  01/25/2021 11:40 AM [name], MD [facility] None    Lab/Order associations:   ICD-10-CM   1. Other acute pulmonary embolism without acute cor pulmonale (HCC)  I26.99   2. Bleeding from left ear  H92.22   3. Acquired coagulation disorder ([place])  D68.9   4. Nonhealing nonsurgical wound  T14.8XXA Ambulatory referral to Dermatology  5. Senile purpura (HCC)  D69.2   6. Anemia, unspecified type  D64.9     Return precautions advised.  [name], MD

## 2020-12-11 NOTE — Patient Instructions (Addendum)
No more q tips!!!  Mineral oil for ear full of wax Purchase mineral oil from laxative aisle Lay down on your side with ear that is bothering you facing up Use 3-4 drops with a dropper and place in ear for 30 seconds Place cotton swab outside of ear Turn to other side and allow this to drain Repeat 3-4 x a day Return to see [name] if not improving within a few days  We will call you within two weeks about your referral to dermatology. If you do not hear within 3 weeks, give [name] a call.   voltaren gel/diclofenac gel trial up to 4x a day for a week or two and if not improving get back into [facility] for their opinion

## 2020-12-12 ENCOUNTER — Ambulatory Visit (INDEPENDENT_AMBULATORY_CARE_PROVIDER_SITE_OTHER): Payer: Medicare Other | Admitting: Family Medicine

## 2020-12-12 ENCOUNTER — Encounter: Payer: Self-pay | Admitting: Family Medicine

## 2020-12-12 ENCOUNTER — Other Ambulatory Visit: Payer: Self-pay

## 2020-12-12 VITALS — BP 132/72 | HR 65 | Temp 97.3°F | Resp 18 | Ht 71.0 in | Wt 238.6 lb

## 2020-12-12 DIAGNOSIS — H9222 Otorrhagia, left ear: Secondary | ICD-10-CM | POA: Diagnosis not present

## 2020-12-12 DIAGNOSIS — T148XXA Other injury of unspecified body region, initial encounter: Secondary | ICD-10-CM | POA: Diagnosis not present

## 2020-12-12 DIAGNOSIS — I2699 Other pulmonary embolism without acute cor pulmonale: Secondary | ICD-10-CM | POA: Diagnosis not present

## 2020-12-12 DIAGNOSIS — D692 Other nonthrombocytopenic purpura: Secondary | ICD-10-CM | POA: Diagnosis not present

## 2020-12-12 DIAGNOSIS — D649 Anemia, unspecified: Secondary | ICD-10-CM

## 2020-12-12 DIAGNOSIS — D689 Coagulation defect, unspecified: Secondary | ICD-10-CM

## 2020-12-19 DIAGNOSIS — R269 Unspecified abnormalities of gait and mobility: Secondary | ICD-10-CM | POA: Diagnosis not present

## 2020-12-19 DIAGNOSIS — S86919A Strain of unspecified muscle(s) and tendon(s) at lower leg level, unspecified leg, initial encounter: Secondary | ICD-10-CM | POA: Diagnosis not present

## 2020-12-19 DIAGNOSIS — Z981 Arthrodesis status: Secondary | ICD-10-CM | POA: Diagnosis not present

## 2020-12-19 DIAGNOSIS — Z789 Other specified health status: Secondary | ICD-10-CM | POA: Diagnosis not present

## 2020-12-19 DIAGNOSIS — Z7409 Other reduced mobility: Secondary | ICD-10-CM | POA: Diagnosis not present

## 2020-12-19 DIAGNOSIS — M79661 Pain in right lower leg: Secondary | ICD-10-CM | POA: Diagnosis not present

## 2020-12-19 DIAGNOSIS — M79662 Pain in left lower leg: Secondary | ICD-10-CM | POA: Diagnosis not present

## 2020-12-26 DIAGNOSIS — Z7409 Other reduced mobility: Secondary | ICD-10-CM | POA: Diagnosis not present

## 2020-12-26 DIAGNOSIS — M79662 Pain in left lower leg: Secondary | ICD-10-CM | POA: Diagnosis not present

## 2020-12-26 DIAGNOSIS — Z789 Other specified health status: Secondary | ICD-10-CM | POA: Diagnosis not present

## 2020-12-26 DIAGNOSIS — M79661 Pain in right lower leg: Secondary | ICD-10-CM | POA: Diagnosis not present

## 2020-12-26 DIAGNOSIS — R269 Unspecified abnormalities of gait and mobility: Secondary | ICD-10-CM | POA: Diagnosis not present

## 2020-12-26 DIAGNOSIS — S86919A Strain of unspecified muscle(s) and tendon(s) at lower leg level, unspecified leg, initial encounter: Secondary | ICD-10-CM | POA: Diagnosis not present

## 2020-12-28 ENCOUNTER — Encounter (HOSPITAL_BASED_OUTPATIENT_CLINIC_OR_DEPARTMENT_OTHER): Payer: Self-pay | Admitting: Emergency Medicine

## 2020-12-28 ENCOUNTER — Emergency Department (HOSPITAL_BASED_OUTPATIENT_CLINIC_OR_DEPARTMENT_OTHER)
Admission: EM | Admit: 2020-12-28 | Discharge: 2020-12-28 | Disposition: A | Discharge status: Home / Self Care | Payer: Medicare Other | Attending: Emergency Medicine | Admitting: Emergency Medicine

## 2020-12-28 ENCOUNTER — Other Ambulatory Visit: Payer: Self-pay

## 2020-12-28 DIAGNOSIS — Z87891 Personal history of nicotine dependence: Secondary | ICD-10-CM | POA: Insufficient documentation

## 2020-12-28 DIAGNOSIS — Z79899 Other long term (current) drug therapy: Secondary | ICD-10-CM | POA: Insufficient documentation

## 2020-12-28 DIAGNOSIS — M25561 Pain in right knee: Secondary | ICD-10-CM | POA: Diagnosis not present

## 2020-12-28 DIAGNOSIS — N183 Chronic kidney disease, stage 3 unspecified: Secondary | ICD-10-CM | POA: Diagnosis not present

## 2020-12-28 DIAGNOSIS — I129 Hypertensive chronic kidney disease with stage 1 through stage 4 chronic kidney disease, or unspecified chronic kidney disease: Secondary | ICD-10-CM | POA: Diagnosis not present

## 2020-12-28 DIAGNOSIS — Z96641 Presence of right artificial hip joint: Secondary | ICD-10-CM | POA: Insufficient documentation

## 2020-12-28 DIAGNOSIS — Z86711 Personal history of pulmonary embolism: Secondary | ICD-10-CM | POA: Insufficient documentation

## 2020-12-28 DIAGNOSIS — J45909 Unspecified asthma, uncomplicated: Secondary | ICD-10-CM | POA: Diagnosis not present

## 2020-12-28 DIAGNOSIS — I2510 Atherosclerotic heart disease of native coronary artery without angina pectoris: Secondary | ICD-10-CM | POA: Diagnosis not present

## 2020-12-28 DIAGNOSIS — Z8546 Personal history of malignant neoplasm of prostate: Secondary | ICD-10-CM | POA: Insufficient documentation

## 2020-12-28 NOTE — ED Provider Notes (Signed)
[facility] EMERGENCY DEPARTMENT Provider Note   CSN: [id number] Arrival date & time: 12/28/20  1128     History Chief Complaint  Patient presents with   Knee Pain    right    Bruce Ball is a 74 y.o. male.  HPI      Bruce Ball is a 74 y.o. male, with a history of CAD, chronic back pain, chronic leg pain, GERD, HTN, PE, presenting to the ED with right knee pain for the past month. Patient states he has been having pain to the right lateral knee since a fall November 26.  He states he has been doing physical therapy due to a lumbar spinal surgery and his knee pain seems to be getting worse during this therapy. Pain is described as a soreness and sharpness, radiating toward the right hip, moderate. He states he has a distant history of seeing Dr. Raeford Razor and would like a referral to his office again.  Denies fever, leg swelling, additional falls/trauma, acute weakness, acute numbness, or any other complaints.   Past Medical History:  Diagnosis Date   CAD (coronary artery disease)    Chronic back pain greater than 3 months duration    Chronic leg pain    Deafness in right ear    Dyspnea    cause by PE   Extrinsic asthma, unspecified 04/07/2009   PT DENIES   GERD (gastroesophageal reflux disease)    History of kidney stones    HYPERTENSION 10/29/2007   HYPOTENSION 08/13/2010   LOW BACK PAIN 04/21/2008   NEPHROLITHIASIS, HX OF 04/21/2008   Pneumonia    PUD, HX OF 04/21/2008   Pulmonary emboli ([place]) 2021   noted on CT 12-2019   Renal disorder     Patient Active Problem List   Diagnosis Date Noted   Acquired coagulation disorder ([place]) 12/12/2020   Senile purpura ([place]) 12/12/2020   Thin skin 07/25/2020   Aortic atherosclerosis ([place]) 05/17/2020   B12 deficiency 05/17/2020   Iliac artery aneurysm ([place]) 05/17/2020   Osteoporosis 04/04/2020   Idiopathic neuropathy 01/28/2020   Recurrent falls 01/28/2020   Chronic kidney  disease (CKD), stage III (moderate) ([place]) 01/19/2020   Pulmonary emboli ([place]) 01/14/2020   GERD (gastroesophageal reflux disease) 12/22/2019   Essential tremor 12/22/2019   Acute pain of right knee 07/15/2019   Gross hematuria 06/14/2019   Hyperlipidemia 11/11/2017   Community acquired pneumonia of right upper lobe of lung 11/05/2017   Right ureteral stone 11/05/2017   History of total right hip replacement 03/08/2017   History of prostate cancer 03/08/2017   Hx of chondrosarcoma 02/28/2017   High risk medication use 02/28/2017   DDD (degenerative disc disease), thoracic 02/28/2017   Ataxia 05/18/2016   Vestibular disequilibrium 05/18/2016   Ankylosing spondylitis ([place]) 05/24/2014   Lung nodule, solitary 05/24/2014   Peripheral edema 06/08/2012   Extrinsic asthma 04/07/2009   LOW BACK PAIN 04/21/2008   History of peptic ulcer disease 04/21/2008   Essential hypertension 10/29/2007    Past Surgical History:  Procedure Laterality Date   BACK SURGERY     bone cancer  1970   rt femur removed and steel rod placed   [place], URETEROSCOPY AND STENT PLACEMENT Right 11/06/2017   Procedure: CYSTOSCOPY WITH  STENT PLACEMENT RIGHT;  Surgeon: Raynelle Bring, MD;  Location: [facility];  Service: Urology;  Laterality: Right;   CYSTOSCOPY WITH RETROGRADE PYELOGRAM, URETEROSCOPY AND STENT PLACEMENT Left 06/09/2019   Procedure: CYSTOSCOPY WITH RETROGRADE  PYELOGRAM, URETEROSCOPY AND STENT PLACEMENT X2;  Surgeon: Sebastian Ache, MD;  Location: [facility];  Service: Urology;  Laterality: Left;   CYSTOSCOPY WITH RETROGRADE PYELOGRAM, URETEROSCOPY AND STENT PLACEMENT Bilateral 03/01/2020   Procedure: CYSTOSCOPY WITH RETROGRADE PYELOGRAM, URETEROSCOPY AND STENT PLACEMENT;  Surgeon: Sebastian Ache, MD;  [facility];  Service: Urology;  Laterality: Bilateral;  75 MINS   CYSTOSCOPY/RETROGRADE/URETEROSCOPY     1 year ago at [facility] in [place]    CYSTOSCOPY/URETEROSCOPY/HOLMIUM LASER Right 11/24/2017   Procedure: CYSTOSCOPY/URETEROSCOPY/ RETROGRADE/STENT REMOVAL;  Surgeon: Heloise Purpura, MD;  [facility];  Service: Urology;  Laterality: Right;   HOLMIUM LASER APPLICATION Bilateral 03/01/2020   Procedure: HOLMIUM LASER APPLICATION;  Surgeon: Sebastian Ache, MD;  [facility];  Service: Urology;  Laterality: Bilateral;   prosatectomy         Family History  Problem Relation Age of Onset   COPD Father    Cancer Other        prostate   Prostate cancer Brother    Esophageal cancer Neg Hx    Stomach cancer Neg Hx    Pancreatic cancer Neg Hx    Colon cancer Neg Hx     Social History   Tobacco Use   Smoking status: Former Smoker    Packs/day: 1.00    Years: 17.00    Pack years: 17.00    Start date: 1963    Quit date: 12/30/1978    Years since quitting: 42.0   Smokeless tobacco: Never Used  Vaping Use   Vaping Use: Never used  Substance Use Topics   Alcohol use: No   Drug use: No    Home Medications Prior to Admission medications   Medication Sig Start Date End Date Taking? Authorizing Provider  amLODipine (NORVASC) 2.5 MG tablet Take 2.5 mg by mouth daily.    [provider]  apixaban (ELIQUIS) 5 MG TABS tablet Take 2 tablets (10mg ) twice daily for 7 days, then 1 tablet (5mg ) twice daily Patient taking differently: Take 5 mg by mouth 2 (two) times daily. 01/16/20   , MD  atorvastatin (LIPITOR) 20 MG tablet Take 20 mg at bedtime by mouth.     [provider]  carvedilol (COREG) 25 MG tablet Take 25 mg by mouth 2 (two) times daily.    [provider]  Cholecalciferol (VITAMIN D3) 50 MCG (2000 UT) capsule Take 2,000 Units by mouth in the morning and at bedtime.     [provider]  indapamide (LOZOL) 1.25 MG tablet Take 1.25 mg by mouth daily. 03/09/20   [provider]  isosorbide mononitrate (IMDUR) 30 MG 24 hr tablet Take 30 mg by  mouth daily.     [provider]  metoCLOPramide (REGLAN) 5 MG tablet TAKE 1 TABLET (5 MG TOTAL) BY MOUTH 3 (THREE) TIMES DAILY BEFORE MEALS. 04/24/20   Armbruster, 05/09/20, MD  omeprazole (PRILOSEC) 20 MG capsule Take 40 mg by mouth 2 (two) times daily.    [provider]  predniSONE (DELTASONE) 5 MG tablet Take 5 mg by mouth daily with breakfast.    [provider]    Allergies    Patient has no known allergies.  Review of Systems   Review of Systems  Constitutional: Negative for chills and fever.  Respiratory: Negative for shortness of breath.   Cardiovascular: Negative for chest pain.  Neurological: Negative for weakness and numbness.    Physical Exam Updated Vital Signs BP (!) 136/50 (BP Location: Right  Arm)    Pulse 63    Temp 98.2 F (36.8 C) (Oral)    Resp 18    SpO2 100%   Physical Exam Vitals and nursing note reviewed.  Constitutional:      General: He is not in acute distress.    Appearance: He is well-developed and well-nourished. He is not diaphoretic.  HENT:     Head: Normocephalic and atraumatic.  Eyes:     Conjunctiva/sclera: Conjunctivae normal.  Cardiovascular:     Rate and Rhythm: Normal rate and regular rhythm.     Pulses:          Dorsalis pedis pulses are 2+ on the right side.       Posterior tibial pulses are 2+ on the right side.  Pulmonary:     Effort: Pulmonary effort is normal.  Musculoskeletal:     Cervical back: Neck supple.     Comments: Some tenderness to the right lateral knee.  Pain elicited to the lateral knee with varus stress.  No swelling, erythema, or laxity noted.  Patella appears to be in correct anatomical position. No tenderness along the rest of the right lower extremity.  Skin:    General: Skin is warm and dry.     Coloration: Skin is not pale.  Neurological:     Mental Status: He is alert.     Comments: Sensation light touch grossly intact in the right lower extremity. 4/5 strength with flexion and  extension in the bilateral knees. Strength 5/5 in the bilateral ankles.  Psychiatric:        Mood and Affect: Mood and affect normal.        Behavior: Behavior normal.     ED Results / Procedures / Treatments   Labs (all labs ordered are listed, but only abnormal results are displayed) Labs Reviewed - No data to display  EKG None  Radiology No results found.  Procedures Procedures (including critical care time)  Medications Ordered in ED Medications - No data to display  ED Course  I have reviewed the triage vital signs and the nursing notes.  Pertinent labs & imaging results that were available during my care of the patient were reviewed by me and considered in my medical decision making (see chart for details).    MDM Rules/Calculators/A&P                         Patient presents with right knee pain since the latter part of November following a fall. No evidence of acute neurovascular compromise. X-ray of the knee recommended and discussed, but patient declined. He states he has been using a walker as part of his therapy following his back surgery and has been able to ambulate with this. He states he already has a knee immobilizer at home and is comfortable putting this on himself.   Findings and plan of care discussed with attending physician, Fredia Sorrow, MD.    Final Clinical Impression(s) / ED Diagnoses Final diagnoses:  Acute pain of right knee    Rx / DC Orders ED Discharge Orders    None       Layla Maw 12/28/20 1446    Fredia Sorrow, MD 01/07/21 0002

## 2020-12-28 NOTE — ED Triage Notes (Signed)
Reports right knee pain , injury 1 month ago, been seen for it, sts worsening pain .

## 2020-12-28 NOTE — Discharge Instructions (Addendum)
  You have been seen today for a knee injury.  Acetaminophen: May take acetaminophen (generic for Tylenol), as needed, for pain. Your daily total maximum amount of acetaminophen from all sources should be limited to 4000mg /day for persons without liver problems, or 2000mg /day for those with liver problems. Ice: May apply ice to the area over the next 24 hours for 15 minutes at a time to reduce swelling. Elevation: Keep the extremity elevated as often as possible to reduce pain and inflammation. Support: Wear the knee immobilizer you have at home for support and comfort. Wear this until pain resolves or until told otherwise by the orthopedic specialist.  Follow up: Follow-up with the orthopedic specialist, as discussed.  Call the number provided to make an appointment. Return: Return to the ED for numbness, weakness, increasing pain, overall worsening symptoms, loss of function, or if symptoms are not improving, you have tried to follow up with the orthopedic specialist, and have been unable to do so.  For prescription assistance, may try using prescription discount sites or apps, such as goodrx.com or Good Rx smart phone app.

## 2021-01-01 ENCOUNTER — Ambulatory Visit: Payer: Medicare Other | Admitting: Family Medicine

## 2021-01-01 NOTE — Progress Notes (Deleted)
Bruce Ball - 75 y.o. male MRN [id number]  Date of birth: 07-Feb-1946  SUBJECTIVE:  Including CC & ROS.  No chief complaint on file.   Bruce Ball is a 75 y.o. male that is  ***.  ***   Review of Systems See HPI   HISTORY: Past Medical, Surgical, Social, and Family History Reviewed & Updated per EMR.   Pertinent Historical Findings include:  Past Medical History:  Diagnosis Date  . CAD (coronary artery disease)   . Chronic back pain greater than 3 months duration   . Chronic leg pain   . Deafness in right ear   . Dyspnea    cause by PE  . Extrinsic asthma, unspecified 04/07/2009   PT DENIES  . GERD (gastroesophageal reflux disease)   . History of kidney stones   . HYPERTENSION 10/29/2007  . HYPOTENSION 08/13/2010  . LOW BACK PAIN 04/21/2008  . NEPHROLITHIASIS, HX OF 04/21/2008  . Pneumonia   . PUD, HX OF 04/21/2008  . Pulmonary emboli (HCC) 2021   noted on CT 12-2019  . Renal disorder     Past Surgical History:  Procedure Laterality Date  . BACK SURGERY    . bone cancer  1970   rt femur removed and steel rod placed  . CYSTOSCOPY WITH RETROGRADE PYELOGRAM, URETEROSCOPY AND STENT PLACEMENT Right 11/06/2017   Procedure: CYSTOSCOPY WITH  STENT PLACEMENT RIGHT;  Surgeon: Heloise Purpura, MD;  [facility];  Service: Urology;  Laterality: Right;  . CYSTOSCOPY WITH RETROGRADE PYELOGRAM, URETEROSCOPY AND STENT PLACEMENT Left 06/09/2019   Procedure: CYSTOSCOPY WITH RETROGRADE PYELOGRAM, URETEROSCOPY AND STENT PLACEMENT X2;  Surgeon: Sebastian Ache, MD;  [facility];  Service: Urology;  Laterality: Left;  . CYSTOSCOPY WITH RETROGRADE PYELOGRAM, URETEROSCOPY AND STENT PLACEMENT Bilateral 03/01/2020   Procedure: CYSTOSCOPY WITH RETROGRADE PYELOGRAM, URETEROSCOPY AND STENT PLACEMENT;  Surgeon: Sebastian Ache, MD;  [facility];  Service: Urology;  Laterality: Bilateral;  75 MINS  . CYSTOSCOPY/RETROGRADE/URETEROSCOPY     1 year ago at [facility] in [place]  .  CYSTOSCOPY/URETEROSCOPY/HOLMIUM LASER Right 11/24/2017   Procedure: CYSTOSCOPY/URETEROSCOPY/ RETROGRADE/STENT REMOVAL;  Surgeon: Heloise Purpura, MD;  [facility];  Service: Urology;  Laterality: Right;  . HOLMIUM LASER APPLICATION Bilateral 03/01/2020   Procedure: HOLMIUM LASER APPLICATION;  Surgeon: Sebastian Ache, MD;  [facility];  Service: Urology;  Laterality: Bilateral;  . prosatectomy      Family History  Problem Relation Age of Onset  . COPD Father   . Cancer Other        prostate  . Prostate cancer Brother   . Esophageal cancer Neg Hx   . Stomach cancer Neg Hx   . Pancreatic cancer Neg Hx   . Colon cancer Neg Hx     Social History   Socioeconomic History  . Marital status: Married    Spouse name: Not on file  . Number of children: 2  . Years of education: Not on file  . Highest education level: Not on file  Occupational History  . Occupation: Airline pilot  Tobacco Use  . Smoking status: Former Smoker    Packs/day: 1.00    Years: 17.00    Pack years: 17.00    Start date: 1963    Quit date: 12/30/1978    Years since quitting: 42.0  . Smokeless tobacco: Never Used  Vaping Use  . Vaping Use: Never used  Substance and Sexual Activity  . Alcohol use: No  . Drug use: No  .  Sexual activity: Not on file  Other Topics Concern  . Not on file  Social History Narrative  . Not on file   Social Determinants of Health   Financial Resource Strain: Not on file  Food Insecurity: Not on file  Transportation Needs: Not on file  Physical Activity: Not on file  Stress: Not on file  Social Connections: Not on file  Intimate Partner Violence: Not on file     PHYSICAL EXAM:  VS: There were no vitals taken for this visit. Physical Exam Gen: NAD, alert, cooperative with exam, well-appearing MSK:  ***      ASSESSMENT & PLAN:   No problem-specific Assessment & Plan notes found for this encounter.

## 2021-01-02 ENCOUNTER — Ambulatory Visit (HOSPITAL_BASED_OUTPATIENT_CLINIC_OR_DEPARTMENT_OTHER)
Admission: RE | Admit: 2021-01-02 | Discharge: 2021-01-02 | Disposition: A | Discharge status: Home / Self Care | Payer: Medicare Other | Source: Ambulatory Visit | Attending: Family Medicine | Admitting: Family Medicine

## 2021-01-02 ENCOUNTER — Ambulatory Visit (INDEPENDENT_AMBULATORY_CARE_PROVIDER_SITE_OTHER): Payer: Medicare Other | Admitting: Family Medicine

## 2021-01-02 ENCOUNTER — Other Ambulatory Visit: Payer: Self-pay

## 2021-01-02 ENCOUNTER — Ambulatory Visit: Payer: Self-pay

## 2021-01-02 VITALS — BP 122/70 | Ht 71.0 in | Wt 235.0 lb

## 2021-01-02 DIAGNOSIS — S72414A Nondisplaced unspecified condyle fracture of lower end of right femur, initial encounter for closed fracture: Secondary | ICD-10-CM | POA: Insufficient documentation

## 2021-01-02 DIAGNOSIS — M25561 Pain in right knee: Secondary | ICD-10-CM | POA: Diagnosis not present

## 2021-01-02 NOTE — Patient Instructions (Signed)
Good to see you Please try the brace  Please continue the walker and/or cane.  Please use ice as needed Please place physical therapy on hold.   I will call with the results from today  Please send me a message in MyChart with any questions or updates.  Please see me back in 4 weeks.   --Dr. Jordan Likes

## 2021-01-02 NOTE — Assessment & Plan Note (Signed)
Injury occurred on 11/26.  Has a history of total hip arthroplasty with a long stem.  Findings over the lateral femoral condyle which could represent a nondisplaced fracture. -Counseled on home exercise therapy and supportive care. -Please hold on physical therapy for his back. -Hinged knee brace. -X-ray. -Counseled on limit weightbearing on the right knee. -If no improvement can consider further imaging.

## 2021-01-02 NOTE — Progress Notes (Signed)
Bruce Ball - 75 y.o. male MRN [id number]  Date of birth: 08/26/1946  SUBJECTIVE:  Including CC & ROS.  No chief complaint on file.   Bruce Ball is a 75 y.o. male that is presenting with right knee pain.  Has been ongoing since his fall in November.  Occurred after Thanksgiving.  He has been going through physical therapy for his back and his back feels much better since his surgery.  He notices the pain with ambulation and weightbearing.  Has resolution of the pain with sitting down or lying down.  No prior history of right knee surgery.  Has had a right total hip arthroplasty with a longstem.  Feels unsteady and that the knee may give way.   Review of Systems See HPI   HISTORY: Past Medical, Surgical, Social, and Family History Reviewed & Updated per EMR.   Pertinent Historical Findings include:  Past Medical History:  Diagnosis Date  . CAD (coronary artery disease)   . Chronic back pain greater than 3 months duration   . Chronic leg pain   . Deafness in right ear   . Dyspnea    cause by PE  . Extrinsic asthma, unspecified 04/07/2009   PT DENIES  . GERD (gastroesophageal reflux disease)   . History of kidney stones   . HYPERTENSION 10/29/2007  . HYPOTENSION 08/13/2010  . LOW BACK PAIN 04/21/2008  . NEPHROLITHIASIS, HX OF 04/21/2008  . Pneumonia   . PUD, HX OF 04/21/2008  . Pulmonary emboli ([place]) 2021   noted on CT 12-2019  . Renal disorder     Past Surgical History:  Procedure Laterality Date  . BACK SURGERY    . bone cancer  1970   rt femur removed and steel rod placed  . CYSTOSCOPY WITH RETROGRADE PYELOGRAM, URETEROSCOPY AND STENT PLACEMENT Right 11/06/2017   Procedure: CYSTOSCOPY WITH  STENT PLACEMENT RIGHT;  Surgeon: Raynelle Bring, MD;  Location: [facility];  Service: Urology;  Laterality: Right;  . CYSTOSCOPY WITH RETROGRADE PYELOGRAM, URETEROSCOPY AND STENT PLACEMENT Left 06/09/2019   Procedure: CYSTOSCOPY WITH RETROGRADE PYELOGRAM, URETEROSCOPY AND STENT PLACEMENT  X2;  Surgeon: [name], MD;  Location: [facility];  Service: Urology;  Laterality: Left;  . CYSTOSCOPY WITH RETROGRADE PYELOGRAM, URETEROSCOPY AND STENT PLACEMENT Bilateral 03/01/2020   Procedure: CYSTOSCOPY WITH RETROGRADE PYELOGRAM, URETEROSCOPY AND STENT PLACEMENT;  Surgeon: [name], MD;  [facility]: [facility];  Service: Urology;  Laterality: Bilateral;  75 MINS  . CYSTOSCOPY/RETROGRADE/URETEROSCOPY     1 year ago at [place] in [place]  . CYSTOSCOPY/URETEROSCOPY/HOLMIUM LASER Right 11/24/2017   Procedure: CYSTOSCOPY/URETEROSCOPY/ RETROGRADE/STENT REMOVAL;  Surgeon: Raynelle Bring, MD;  [facility];  Service: Urology;  Laterality: Right;  . HOLMIUM LASER APPLICATION Bilateral [id number]   Procedure: HOLMIUM LASER APPLICATION;  Surgeon: [name], MD;  Location: [facility];  Service: Urology;  Laterality: Bilateral;  . prosatectomy      Family History  Problem Relation Age of Onset  . COPD Father   . Cancer Other        prostate  . Prostate cancer Brother   . Esophageal cancer Neg Hx   . Stomach cancer Neg Hx   . Pancreatic cancer Neg Hx   . Colon cancer Neg Hx     Social History   Socioeconomic History  . Marital status: Married    Spouse name: Not on file  . Number of children: 2  . Years of education: Not on file  . Highest education level: Not on  file  Occupational History  . Occupation: Airline pilot  Tobacco Use  . Smoking status: Former Smoker    Packs/day: 1.00    Years: 17.00    Pack years: 17.00    Start date: 1963    Quit date: 12/30/1978    Years since quitting: 42.0  . Smokeless tobacco: Never Used  Vaping Use  . Vaping Use: Never used  Substance and Sexual Activity  . Alcohol use: No  . Drug use: No  . Sexual activity: Not on file  Other Topics Concern  . Not on file  Social History Narrative  . Not on file   Social Determinants of Health   Financial Resource Strain: Not on file  Food Insecurity: Not on file  Transportation Needs: Not on  file  Physical Activity: Not on file  Stress: Not on file  Social Connections: Not on file  Intimate Partner Violence: Not on file     PHYSICAL EXAM:  VS: BP 122/70   Ht 5\' 11"  (1.803 m)   Wt 235 lb (106.6 kg)   BMI 32.78 kg/m  Physical Exam Gen: NAD, alert, cooperative with exam, well-appearing MSK:  Right knee: No effusion. Tenderness palpation of the lateral femoral condyle. No instability. No ecchymosis. Normal strength resistance. Neurovascular intact  Limited ultrasound: Right knee:  No effusion suprapatellar pouch. Normal-appearing quadricep patellar tendon. Normal appearance of the medial joint space and meniscus. Has outpouching of the lateral meniscus but good joint space. Has increased hyperemia over the lateral femoral condyle  Summary: Findings are suggestive of nondisplaced lateral femoral condyle fracture.  Ultrasound and interpretation by , MD    ASSESSMENT & PLAN:   Femoral condyle fracture ([facility]) Injury occurred on 11/26.  Has a history of total hip arthroplasty with a long stem.  Findings over the lateral femoral condyle which could represent a nondisplaced fracture. -Counseled on home exercise therapy and supportive care. -Please hold on physical therapy for his back. -Hinged knee brace. -X-ray. -Counseled on limit weightbearing on the right knee. -If no improvement can consider further imaging.

## 2021-01-03 ENCOUNTER — Telehealth: Payer: Self-pay | Admitting: Family Medicine

## 2021-01-03 NOTE — Telephone Encounter (Signed)
Informed of results.   Myra Rude, MD [facility] 01/03/2021, 9:19 AM

## 2021-01-25 ENCOUNTER — Inpatient Hospital Stay: Payer: Medicare Other

## 2021-01-25 ENCOUNTER — Encounter: Payer: Self-pay | Admitting: Hematology and Oncology

## 2021-01-25 ENCOUNTER — Other Ambulatory Visit: Payer: Self-pay

## 2021-01-25 ENCOUNTER — Inpatient Hospital Stay: Payer: Medicare Other | Attending: Hematology and Oncology | Admitting: Hematology and Oncology

## 2021-01-25 ENCOUNTER — Other Ambulatory Visit: Payer: Self-pay | Admitting: Hematology and Oncology

## 2021-01-25 DIAGNOSIS — Z8546 Personal history of malignant neoplasm of prostate: Secondary | ICD-10-CM | POA: Diagnosis not present

## 2021-01-25 DIAGNOSIS — R296 Repeated falls: Secondary | ICD-10-CM | POA: Insufficient documentation

## 2021-01-25 DIAGNOSIS — G629 Polyneuropathy, unspecified: Secondary | ICD-10-CM | POA: Insufficient documentation

## 2021-01-25 DIAGNOSIS — I2699 Other pulmonary embolism without acute cor pulmonale: Secondary | ICD-10-CM

## 2021-01-25 DIAGNOSIS — D631 Anemia in chronic kidney disease: Secondary | ICD-10-CM | POA: Insufficient documentation

## 2021-01-25 DIAGNOSIS — I351 Nonrheumatic aortic (valve) insufficiency: Secondary | ICD-10-CM | POA: Insufficient documentation

## 2021-01-25 DIAGNOSIS — I700 Atherosclerosis of aorta: Secondary | ICD-10-CM | POA: Diagnosis not present

## 2021-01-25 DIAGNOSIS — Z7901 Long term (current) use of anticoagulants: Secondary | ICD-10-CM | POA: Diagnosis not present

## 2021-01-25 DIAGNOSIS — N200 Calculus of kidney: Secondary | ICD-10-CM | POA: Insufficient documentation

## 2021-01-25 DIAGNOSIS — M8580 Other specified disorders of bone density and structure, unspecified site: Secondary | ICD-10-CM | POA: Diagnosis not present

## 2021-01-25 DIAGNOSIS — M25569 Pain in unspecified knee: Secondary | ICD-10-CM | POA: Insufficient documentation

## 2021-01-25 DIAGNOSIS — R600 Localized edema: Secondary | ICD-10-CM | POA: Insufficient documentation

## 2021-01-25 DIAGNOSIS — N183 Chronic kidney disease, stage 3 unspecified: Secondary | ICD-10-CM | POA: Diagnosis not present

## 2021-01-25 DIAGNOSIS — N189 Chronic kidney disease, unspecified: Secondary | ICD-10-CM | POA: Insufficient documentation

## 2021-01-25 LAB — CBC WITH DIFFERENTIAL/PLATELET
Abs Immature Granulocytes: 0.04 10*3/uL (ref 0.00–0.07)
Basophils Absolute: 0.1 10*3/uL (ref 0.0–0.1)
Basophils Relative: 1 %
Eosinophils Absolute: 0.2 10*3/uL (ref 0.0–0.5)
Eosinophils Relative: 2 %
HCT: 37.9 % — ABNORMAL LOW (ref 39.0–52.0)
Hemoglobin: 11.4 g/dL — ABNORMAL LOW (ref 13.0–17.0)
Immature Granulocytes: 0 %
Lymphocytes Relative: 17 %
Lymphs Abs: 1.6 10*3/uL (ref 0.7–4.0)
MCH: 23.1 pg — ABNORMAL LOW (ref 26.0–34.0)
MCHC: 30.1 g/dL (ref 30.0–36.0)
MCV: 76.9 fL — ABNORMAL LOW (ref 80.0–100.0)
Monocytes Absolute: 0.8 10*3/uL (ref 0.1–1.0)
Monocytes Relative: 8 %
Neutro Abs: 7.1 10*3/uL (ref 1.7–7.7)
Neutrophils Relative %: 72 %
Platelets: 232 10*3/uL (ref 150–400)
RBC: 4.93 MIL/uL (ref 4.22–5.81)
RDW: 17.2 % — ABNORMAL HIGH (ref 11.5–15.5)
WBC: 9.7 10*3/uL (ref 4.0–10.5)
nRBC: 0 % (ref 0.0–0.2)

## 2021-01-25 LAB — BASIC METABOLIC PANEL - CANCER CENTER ONLY
Anion gap: 7 (ref 5–15)
BUN: 17 mg/dL (ref 8–23)
CO2: 31 mmol/L (ref 22–32)
Calcium: 9 mg/dL (ref 8.9–10.3)
Chloride: 104 mmol/L (ref 98–111)
Creatinine: 1.67 mg/dL — ABNORMAL HIGH (ref 0.61–1.24)
GFR, Estimated: 43 mL/min — ABNORMAL LOW (ref >60)
Glucose, Bld: 128 mg/dL — ABNORMAL HIGH (ref 70–99)
Potassium: 3.6 mmol/L (ref 3.5–5.1)
Sodium: 142 mmol/L (ref 135–145)

## 2021-01-25 MED ORDER — APIXABAN 2.5 MG PO TABS: 2.5000 mg | ORAL_TABLET | Freq: Two times a day (BID) | ORAL | Status: AC

## 2021-01-25 NOTE — Progress Notes (Unsigned)
Bmp

## 2021-01-25 NOTE — Assessment & Plan Note (Signed)
The patient has recurrent falls due to his back problems and peripheral neuropathy We discussed the risk and benefits of continuing Eliquis I emphasized the importance of aggressive physical therapy and fall prevention

## 2021-01-25 NOTE — Assessment & Plan Note (Signed)
He has multifactorial anemia, likely a combination of anemia chronic kidney disease and possibly component of iron deficiency I recommend close follow-up and monitoring by primary care doctor He does not need further work-up or transfusion support

## 2021-01-25 NOTE — Assessment & Plan Note (Signed)
His kidney function is stable We discussed the importance of adequate hydration and risk factors modifications

## 2021-01-25 NOTE — Progress Notes (Signed)
Bruce Ball OFFICE PROGRESS NOTE  Bruce Olp, MD  ASSESSMENT & PLAN:  Pulmonary emboli [facility]) Right now, the patient has completed a years worth of anticoagulation therapy We discussed the risk and benefits of extended duration anticoagulation treatment The patient has multiple risk factors for recurrent thromboembolism, given his poor mobility and obesity On the other hand, the patient is at risk of bleeding given multiple falls At the end of today's discussion, we are in agreement to continue anticoagulation therapy at reduced dose of 2.5 mg twice daily indefinitely The patient understood that the purpose of treatment is for secondary prevention If he desire future elective procedures, anticoagulation Therapy can be held 48 hours prior to invasive procedures and resume the day after without bridging therapy The patient has good follow-up with his primary care doctor I recommend twice yearly CBC and BMP while on anticoagulation therapy The purpose of CBC is to follow-up on his blood count, in case the patient has obscure bleeding Eliquis is cleared by the kidneys and the patient have chronic renal failure and that has to be monitored carefully We discussed future follow-up At present time, he is comfortable following with primary care doctor only  Chronic kidney disease (CKD), stage III (moderate) His kidney function is stable We discussed the importance of adequate hydration and risk factors modifications  Recurrent falls The patient has recurrent falls due to his back problems and peripheral neuropathy We discussed the risk and benefits of continuing Eliquis I emphasized the importance of aggressive physical therapy and fall prevention  Anemia in chronic kidney disease He has multifactorial anemia, likely a combination of anemia chronic kidney disease and possibly component of iron deficiency I recommend close follow-up and monitoring by primary care  doctor He does not need further work-up or transfusion support   No orders of the defined types were placed in this encounter.   The total time spent in the appointment was 30 minutes encounter with patients including review of chart and various tests results, discussions about plan of care and coordination of care plan   All questions were answered. The patient knows to call the clinic with any problems, questions or concerns. No barriers to learning was detected.    Bruce Lark, MD 1/27/20223:28 PM  INTERVAL HISTORY: Bruce Ball 75 y.o. 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 He also have history of prostate cancer status post prostatectomy, in remission without the need for adjuvant chemotherapy or radiation therapy He is a retired English as a second language teacher who has been exposed to agent orange when he was younger He is here accompanied by his wife, Bruce Ball  He was recently hospitalized between January 14 to January 16, 2020 after presentation with shortness of breath He was found to have left lower extremity DVT and bilateral PE He complained of shortness of breath shortly after EGD dilatation was performed for chronic reflux disease and esophageal stricture  On January 13, 2020, CT angiogram showed Pulmonary  emboli in all lobes of both lungs. Evidence of right heart strain (RV/LV Ratio = 1.06) consistent with at least submassive (intermediate risk)  PE. The presence of right heart strain has been associated with an increased risk of morbidity and mortality. Coronary artery disease. Aortic Atherosclerosis (ICD10-I70.0).  On January 15, venous Doppler ultrasound showed Right: There is no evidence of deep vein thrombosis in the lower extremity. No cystic structure found in the popliteal fossa.  Left: Findings consistent with acute deep vein thrombosis involving the left peroneal veins. No cystic structure found in the popliteal fossa.   Echocardiogram report showed IMPRESSIONS   1. Left ventricular ejection fraction, by visual estimation, is 60 to 65%. The left ventricle has normal function. There is moderately increased left ventricular hypertrophy.  2. Moderate asymmetric hypertrophy of the basal septum measuring 47mm (posterior wall 42mm)  3. The mitral valve is normal in structure. No evidence of mitral valve regurgitation.  4. The aortic valve is tricuspid. Aortic valve regurgitation is trivial. Mild aortic valve sclerosis without stenosis.  5. The tricuspid valve is grossly normal.  6. The pulmonic valve was not well visualized. Pulmonic valve regurgitation is trivial.  7. Global right ventricle has normal systolic function.The right ventricular size is normal.  8. Left atrial size was normal.  9. Right atrial size was normal. 10. TR signal is inadequate for assessing pulmonary artery systolic pressure  He was anticoagulated and was subsequently discharged He had history of recurrent falls, the last fall was over a month ago According to the patient, after he landed at the top of the stairs, his legs buckled with some associated dizziness but he did not sustain major injury A few months back, he also tripped and fell in his yard According to the patient, he has pre-existing peripheral neuropathy affecting both toes and the bottom of his feet of unknown etiology His wife stated he walks with a shuffle He had history of B12  deficiency requiring vitamin B12 supplementation but according to the [place] records, he was told that his B12 level has been good and he has stopped receiving B12 injections He has never been formally evaluated for the cause of his falls or causes of neuropathy by a neurologist On review of outside records, he was seen by a neurologist several years ago for headache but he did not recall that visit His mobility is poor He has chronic bilateral lower extremity edema for some time When he presented to the emergency room, he denies calf pain but he does have bilateral anterior shin pain that is tender to touch for some time He has chronic back pain requiring neurosurgery and have undergone extensive treatment including surgery, implantation of stimulator and removal, extensive physical therapy and rehab for chronic back pain He denies recent history of preceding trauma but did have falls Denies long distance travel, recent surgery, or smoking. He had remote history of smoking He had prior surgeries before and never had perioperative thromboembolic events. The patient had not been exposed on testosterone replacement therapy  No family history of blood clots So far, he tolerated anticoagulation therapy well without bleeding complications.  On 04/02/2020, he had CT chest, abdomen and pelvis  1. No CT evidence of acute traumatic injury to the chest, abdomen, or pelvis. 2. There is new sclerosis of the right sacral ala when compared to prior examination dated 02/14/2020, likely a subacute insufficiency fracture. 3.  Osteopenia. 4. Aortic Atherosclerosis (ICD10-I70.0). 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 5.  Extensive bilateral nonobstructive nephrolithiasis.   Starting January 26, 2021, the patient is recommended to reduce Eliquis to 2.5 mg twice a day, duration of treatment lifelong  I have reviewed the past medical history,  past surgical history, social history and family history with the patient and they are unchanged from previous note.  ALLERGIES:  has No Known Allergies.  MEDICATIONS:  Current Outpatient Medications  Medication Sig Dispense Refill  . amLODipine (NORVASC) 2.5 MG tablet Take 2.5 mg by mouth daily.    Marland Kitchen apixaban (ELIQUIS) 2.5 MG TABS tablet Take 1 tablet (2.5 mg total) by mouth 2 (two) times daily.    Marland Kitchen atorvastatin (LIPITOR) 20 MG tablet Take 20 mg at bedtime by mouth.     . carvedilol (COREG) 25 MG tablet Take 25 mg by mouth 2 (two) times daily.    . Cholecalciferol (VITAMIN D3) 50 MCG (2000 UT) capsule Take 2,000 Units by mouth in the morning and at bedtime.     . indapamide (LOZOL) 1.25 MG tablet Take 1.25 mg by mouth daily.    . isosorbide mononitrate (IMDUR) 30 MG 24 hr tablet Take 30 mg by mouth daily.     . metoCLOPramide (REGLAN) 5 MG tablet TAKE 1 TABLET (5 MG TOTAL) BY MOUTH 3 (THREE) TIMES DAILY BEFORE MEALS. 90 tablet 1  . omeprazole (PRILOSEC) 20 MG capsule Take 40 mg by mouth 2 (two) times daily.    . predniSONE (DELTASONE) 5 MG tablet Take 5 mg by mouth daily with breakfast.     No current facility-administered medications for this visit.     REVIEW OF SYSTEMS:   Constitutional: Denies fevers, chills or night sweats Eyes: Denies blurriness of vision Ears, nose, mouth, throat, and face: Denies mucositis or sore throat Respiratory: Denies cough, dyspnea or wheezes Cardiovascular: Denies palpitation, chest discomfort or lower extremity swelling Gastrointestinal:  Denies nausea, heartburn or change in bowel habits Skin: Denies abnormal skin rashes Lymphatics: Denies new lymphadenopathy or easy bruising Neurological:Denies numbness, tingling or new weaknesses Behavioral/Psych: Mood is stable, no new changes  All other systems were reviewed with the patient and are negative.  PHYSICAL EXAMINATION: ECOG PERFORMANCE STATUS: 2 - Symptomatic, <50% confined to bed  Vitals:    01/25/21 1145  BP: (!) 136/56  Pulse: 66  Resp: 18  Temp: (!) 97.4 F (36.3 C)  SpO2: 97%   Filed Weights   01/25/21 1145  Weight: 245 lb 3.2 oz (111.2 kg)    GENERAL:alert, no distress and comfortable NEURO: alert & oriented x 3 with fluent speech, no focal motor/sensory deficits  LABORATORY DATA:  I have reviewed the data as listed     Component Value Date/Time   NA 142 01/25/2021 1125   NA 144 11/30/2019 0000   K 3.6 01/25/2021 1125   CL 104 01/25/2021 1125   CO2 31 01/25/2021 1125   GLUCOSE 128 (H) 01/25/2021 1125   BUN 17 01/25/2021 1125   BUN 30 (A) 11/30/2019 0000   CREATININE 1.67 (H) 01/25/2021 1125   CALCIUM 9.0 01/25/2021 1125   PROT 5.9 (L) 04/04/2020 1527   PROT 5.9 (L) 02/07/2020 1509   ALBUMIN 3.7 04/04/2020 1527   AST 20 04/04/2020 1527   ALT 21 04/04/2020 1527   ALKPHOS 67 04/04/2020 1527   BILITOT 0.7 04/04/2020 1527   GFRNONAA 43 (L) 01/25/2021 1125   GFRAA 56 (L) 07/25/2020 1154    No results found for: SPEP, UPEP  Lab Results  Component Value Date   WBC 9.7 01/25/2021   NEUTROABS 7.1 01/25/2021   HGB 11.4 (L) 01/25/2021   HCT 37.9 (L) 01/25/2021   MCV 76.9 (L) 01/25/2021   PLT 232 01/25/2021      Chemistry      Component Value Date/Time   NA 142 01/25/2021 1125   NA 144 11/30/2019 0000   K 3.6 01/25/2021 1125   CL 104 01/25/2021 1125   CO2 31 01/25/2021 1125   BUN 17 01/25/2021 1125   BUN 30 (A) 11/30/2019 0000   CREATININE 1.67 (H) 01/25/2021 1125   GLU 99 11/30/2019 0000      Component Value Date/Time   CALCIUM 9.0 01/25/2021 1125   ALKPHOS 67 04/04/2020 1527   AST 20 04/04/2020 1527   ALT 21 04/04/2020 1527   BILITOT 0.7 04/04/2020 1527

## 2021-01-25 NOTE — Assessment & Plan Note (Signed)
Right now, the patient has completed a years worth of anticoagulation therapy We discussed the risk and benefits of extended duration anticoagulation treatment The patient has multiple risk factors for recurrent thromboembolism, given his poor mobility and obesity On the other hand, the patient is at risk of bleeding given multiple falls At the end of today's discussion, we are in agreement to continue anticoagulation therapy at reduced dose of 2.5 mg twice daily indefinitely The patient understood that the purpose of treatment is for secondary prevention If he desire future elective procedures, anticoagulation Therapy can be held 48 hours prior to invasive procedures and resume the day after without bridging therapy The patient has good follow-up with his primary care doctor I recommend twice yearly CBC and BMP while on anticoagulation therapy The purpose of CBC is to follow-up on his blood count, in case the patient has obscure bleeding Eliquis is cleared by the kidneys and the patient have chronic renal failure and that has to be monitored carefully We discussed future follow-up At present time, he is comfortable following with primary care doctor only

## 2021-01-30 ENCOUNTER — Other Ambulatory Visit: Payer: Self-pay

## 2021-01-30 ENCOUNTER — Ambulatory Visit (INDEPENDENT_AMBULATORY_CARE_PROVIDER_SITE_OTHER): Payer: Medicare Other | Admitting: Family Medicine

## 2021-01-30 DIAGNOSIS — S72414D Nondisplaced unspecified condyle fracture of lower end of right femur, subsequent encounter for closed fracture with routine healing: Secondary | ICD-10-CM

## 2021-01-30 NOTE — Progress Notes (Signed)
Bruce Ball - 75 y.o. male MRN [id number]  Date of birth: 06-17-1946  SUBJECTIVE:  Including CC & ROS.  No chief complaint on file.   Bruce Ball is a 75 y.o. male that is following up for his right knee.  He denies any pain today.     Review of Systems See HPI   HISTORY: Past Medical, Surgical, Social, and Family History Reviewed & Updated per EMR.   Pertinent Historical Findings include:  Past Medical History:  Diagnosis Date  . CAD (coronary artery disease)   . Chronic back pain greater than 3 months duration   . Chronic leg pain   . Deafness in right ear   . Dyspnea    cause by PE  . Extrinsic asthma, unspecified 04/07/2009   PT DENIES  . GERD (gastroesophageal reflux disease)   . History of kidney stones   . HYPERTENSION 10/29/2007  . HYPOTENSION 08/13/2010  . LOW BACK PAIN 04/21/2008  . NEPHROLITHIASIS, HX OF 04/21/2008  . Pneumonia   . PUD, HX OF 04/21/2008  . Pulmonary emboli ([place]) 2021   noted on CT 12-2019  . Renal disorder     Past Surgical History:  Procedure Laterality Date  . BACK SURGERY    . bone cancer  1970   rt femur removed and steel rod placed  . CYSTOSCOPY WITH RETROGRADE PYELOGRAM, URETEROSCOPY AND STENT PLACEMENT Right 11/06/2017   Procedure: CYSTOSCOPY WITH  STENT PLACEMENT RIGHT;  Surgeon: Raynelle Bring, MD;  Location: [facility];  Service: Urology;  Laterality: Right;  . CYSTOSCOPY WITH RETROGRADE PYELOGRAM, URETEROSCOPY AND STENT PLACEMENT Left 06/09/2019   Procedure: CYSTOSCOPY WITH RETROGRADE PYELOGRAM, URETEROSCOPY AND STENT PLACEMENT X2;  Surgeon: [name], MD;  Location: [facility];  Service: Urology;  Laterality: Left;  . CYSTOSCOPY WITH RETROGRADE PYELOGRAM, URETEROSCOPY AND STENT PLACEMENT Bilateral 03/01/2020   Procedure: CYSTOSCOPY WITH RETROGRADE PYELOGRAM, URETEROSCOPY AND STENT PLACEMENT;  Surgeon: [name], MD;  Location: [facility];  Service: Urology;  Laterality: Bilateral;  75 MINS  . CYSTOSCOPY/RETROGRADE/URETEROSCOPY      1 year ago at [place] in [place]  . CYSTOSCOPY/URETEROSCOPY/HOLMIUM LASER Right 11/24/2017   Procedure: CYSTOSCOPY/URETEROSCOPY/ RETROGRADE/STENT REMOVAL;  Surgeon: Raynelle Bring, MD;  [facility]: [facility];  Service: Urology;  Laterality: Right;  . HOLMIUM LASER APPLICATION Bilateral 08/30/6944   Procedure: HOLMIUM LASER APPLICATION;  Surgeon: [name], MD;  Location: [facility];  Service: Urology;  Laterality: Bilateral;  . prosatectomy      Family History  Problem Relation Age of Onset  . COPD Father   . Cancer Other        prostate  . Prostate cancer Brother   . Esophageal cancer Neg Hx   . Stomach cancer Neg Hx   . Pancreatic cancer Neg Hx   . Colon cancer Neg Hx     Social History   Socioeconomic History  . Marital status: Married    Spouse name: Not on file  . Number of children: 2  . Years of education: Not on file  . Highest education level: Not on file  Occupational History  . Occupation: Optometrist  Tobacco Use  . Smoking status: Former Smoker    Packs/day: 1.00    Years: 17.00    Pack years: 17.00    Start date: 1963    Quit date: 12/30/1978    Years since quitting: 42.1  . Smokeless tobacco: Never Used  Vaping Use  . Vaping Use: Never used  Substance and Sexual Activity  .  Alcohol use: No  . Drug use: No  . Sexual activity: Not on file  Other Topics Concern  . Not on file  Social History Narrative  . Not on file   Social Determinants of Health   Financial Resource Strain: Not on file  Food Insecurity: Not on file  Transportation Needs: Not on file  Physical Activity: Not on file  Stress: Not on file  Social Connections: Not on file  Intimate Partner Violence: Not on file     PHYSICAL EXAM:  VS: BP 136/72 (BP Location: Left Arm, Patient Position: Sitting, Cuff Size: Large)   Ht 5\' 11"  (1.803 m)   Wt 250 lb (113.4 kg)   BMI 34.87 kg/m  Physical Exam Gen: NAD, alert, cooperative with exam, well-appearing  ASSESSMENT & PLAN:   Femoral  condyle fracture ([place]) Has had resolution of his pain since his fall.   -Counseled on home exercise therapy and supportive care. -Could consider physical therapy if needed.

## 2021-01-30 NOTE — Assessment & Plan Note (Signed)
Has had resolution of his pain since his fall.   -Counseled on home exercise therapy and supportive care. -Could consider physical therapy if needed.

## 2021-02-07 ENCOUNTER — Telehealth: Payer: Self-pay | Admitting: Family Medicine

## 2021-02-07 NOTE — Telephone Encounter (Signed)
Left message for patient to call back and schedule Medicare Annual Wellness Visit (AWV) either virtually OR in office.   Last AWV 11/29/19; please schedule at anytime with [facility]Nurse Health Advisor at [facility].  This should be a 45 minute visit.

## 2021-03-08 DIAGNOSIS — L298 Other pruritus: Secondary | ICD-10-CM | POA: Diagnosis not present

## 2021-03-08 DIAGNOSIS — C44311 Basal cell carcinoma of skin of nose: Secondary | ICD-10-CM | POA: Diagnosis not present

## 2021-03-08 DIAGNOSIS — L820 Inflamed seborrheic keratosis: Secondary | ICD-10-CM | POA: Diagnosis not present

## 2021-03-08 DIAGNOSIS — L570 Actinic keratosis: Secondary | ICD-10-CM | POA: Diagnosis not present

## 2021-03-08 DIAGNOSIS — C4441 Basal cell carcinoma of skin of scalp and neck: Secondary | ICD-10-CM | POA: Diagnosis not present

## 2021-03-26 ENCOUNTER — Emergency Department (HOSPITAL_BASED_OUTPATIENT_CLINIC_OR_DEPARTMENT_OTHER): Payer: Medicare Other

## 2021-03-26 ENCOUNTER — Encounter: Payer: Self-pay | Admitting: Physician Assistant

## 2021-03-26 ENCOUNTER — Emergency Department (HOSPITAL_BASED_OUTPATIENT_CLINIC_OR_DEPARTMENT_OTHER): Payer: Medicare Other | Admitting: Radiology

## 2021-03-26 ENCOUNTER — Ambulatory Visit (INDEPENDENT_AMBULATORY_CARE_PROVIDER_SITE_OTHER): Payer: Medicare Other | Admitting: Physician Assistant

## 2021-03-26 ENCOUNTER — Encounter: Payer: Self-pay | Admitting: Hematology and Oncology

## 2021-03-26 ENCOUNTER — Inpatient Hospital Stay (HOSPITAL_BASED_OUTPATIENT_CLINIC_OR_DEPARTMENT_OTHER)
Admission: EM | Admit: 2021-03-26 | Discharge: 2021-03-29 | DRG: 291 | Disposition: A | Discharge status: Home / Self Care | Payer: Medicare Other | Attending: Internal Medicine | Admitting: Internal Medicine

## 2021-03-26 ENCOUNTER — Other Ambulatory Visit: Payer: Self-pay

## 2021-03-26 ENCOUNTER — Encounter (HOSPITAL_BASED_OUTPATIENT_CLINIC_OR_DEPARTMENT_OTHER): Payer: Self-pay | Admitting: Obstetrics and Gynecology

## 2021-03-26 VITALS — BP 188/83 | HR 66 | Temp 98.2°F | Ht 71.0 in | Wt 261.2 lb

## 2021-03-26 DIAGNOSIS — I160 Hypertensive urgency: Secondary | ICD-10-CM | POA: Diagnosis present

## 2021-03-26 DIAGNOSIS — E876 Hypokalemia: Secondary | ICD-10-CM | POA: Diagnosis not present

## 2021-03-26 DIAGNOSIS — I10 Essential (primary) hypertension: Secondary | ICD-10-CM | POA: Diagnosis present

## 2021-03-26 DIAGNOSIS — R609 Edema, unspecified: Secondary | ICD-10-CM | POA: Diagnosis present

## 2021-03-26 DIAGNOSIS — M810 Age-related osteoporosis without current pathological fracture: Secondary | ICD-10-CM | POA: Diagnosis present

## 2021-03-26 DIAGNOSIS — K5730 Diverticulosis of large intestine without perforation or abscess without bleeding: Secondary | ICD-10-CM | POA: Diagnosis not present

## 2021-03-26 DIAGNOSIS — Z20822 Contact with and (suspected) exposure to covid-19: Secondary | ICD-10-CM | POA: Diagnosis present

## 2021-03-26 DIAGNOSIS — H9191 Unspecified hearing loss, right ear: Secondary | ICD-10-CM | POA: Diagnosis present

## 2021-03-26 DIAGNOSIS — I700 Atherosclerosis of aorta: Secondary | ICD-10-CM | POA: Diagnosis present

## 2021-03-26 DIAGNOSIS — I130 Hypertensive heart and chronic kidney disease with heart failure and stage 1 through stage 4 chronic kidney disease, or unspecified chronic kidney disease: Principal | ICD-10-CM | POA: Diagnosis present

## 2021-03-26 DIAGNOSIS — M5134 Other intervertebral disc degeneration, thoracic region: Secondary | ICD-10-CM | POA: Diagnosis present

## 2021-03-26 DIAGNOSIS — R296 Repeated falls: Secondary | ICD-10-CM | POA: Diagnosis present

## 2021-03-26 DIAGNOSIS — R140 Abdominal distension (gaseous): Secondary | ICD-10-CM | POA: Diagnosis present

## 2021-03-26 DIAGNOSIS — G8929 Other chronic pain: Secondary | ICD-10-CM | POA: Diagnosis present

## 2021-03-26 DIAGNOSIS — K219 Gastro-esophageal reflux disease without esophagitis: Secondary | ICD-10-CM | POA: Diagnosis present

## 2021-03-26 DIAGNOSIS — Z79899 Other long term (current) drug therapy: Secondary | ICD-10-CM

## 2021-03-26 DIAGNOSIS — Z86718 Personal history of other venous thrombosis and embolism: Secondary | ICD-10-CM

## 2021-03-26 DIAGNOSIS — I509 Heart failure, unspecified: Secondary | ICD-10-CM

## 2021-03-26 DIAGNOSIS — D631 Anemia in chronic kidney disease: Secondary | ICD-10-CM | POA: Diagnosis present

## 2021-03-26 DIAGNOSIS — Z86711 Personal history of pulmonary embolism: Secondary | ICD-10-CM

## 2021-03-26 DIAGNOSIS — I5041 Acute combined systolic (congestive) and diastolic (congestive) heart failure: Secondary | ICD-10-CM | POA: Diagnosis present

## 2021-03-26 DIAGNOSIS — R601 Generalized edema: Secondary | ICD-10-CM | POA: Diagnosis not present

## 2021-03-26 DIAGNOSIS — I77810 Thoracic aortic ectasia: Secondary | ICD-10-CM | POA: Diagnosis present

## 2021-03-26 DIAGNOSIS — M7989 Other specified soft tissue disorders: Secondary | ICD-10-CM | POA: Diagnosis present

## 2021-03-26 DIAGNOSIS — J90 Pleural effusion, not elsewhere classified: Secondary | ICD-10-CM | POA: Diagnosis not present

## 2021-03-26 DIAGNOSIS — E785 Hyperlipidemia, unspecified: Secondary | ICD-10-CM | POA: Diagnosis present

## 2021-03-26 DIAGNOSIS — I5030 Unspecified diastolic (congestive) heart failure: Secondary | ICD-10-CM | POA: Diagnosis not present

## 2021-03-26 DIAGNOSIS — K449 Diaphragmatic hernia without obstruction or gangrene: Secondary | ICD-10-CM | POA: Diagnosis present

## 2021-03-26 DIAGNOSIS — R0602 Shortness of breath: Secondary | ICD-10-CM

## 2021-03-26 DIAGNOSIS — I2510 Atherosclerotic heart disease of native coronary artery without angina pectoris: Secondary | ICD-10-CM | POA: Diagnosis present

## 2021-03-26 DIAGNOSIS — N1831 Chronic kidney disease, stage 3a: Secondary | ICD-10-CM | POA: Diagnosis not present

## 2021-03-26 DIAGNOSIS — N183 Chronic kidney disease, stage 3 unspecified: Secondary | ICD-10-CM | POA: Diagnosis present

## 2021-03-26 DIAGNOSIS — Z7901 Long term (current) use of anticoagulants: Secondary | ICD-10-CM

## 2021-03-26 DIAGNOSIS — Z87891 Personal history of nicotine dependence: Secondary | ICD-10-CM

## 2021-03-26 DIAGNOSIS — N179 Acute kidney failure, unspecified: Secondary | ICD-10-CM | POA: Diagnosis not present

## 2021-03-26 DIAGNOSIS — I5031 Acute diastolic (congestive) heart failure: Secondary | ICD-10-CM | POA: Diagnosis not present

## 2021-03-26 DIAGNOSIS — Z96641 Presence of right artificial hip joint: Secondary | ICD-10-CM | POA: Diagnosis present

## 2021-03-26 DIAGNOSIS — I110 Hypertensive heart disease with heart failure: Secondary | ICD-10-CM | POA: Diagnosis not present

## 2021-03-26 DIAGNOSIS — I5033 Acute on chronic diastolic (congestive) heart failure: Secondary | ICD-10-CM | POA: Diagnosis not present

## 2021-03-26 DIAGNOSIS — N200 Calculus of kidney: Secondary | ICD-10-CM | POA: Diagnosis present

## 2021-03-26 DIAGNOSIS — Z87442 Personal history of urinary calculi: Secondary | ICD-10-CM

## 2021-03-26 DIAGNOSIS — J9811 Atelectasis: Secondary | ICD-10-CM | POA: Diagnosis not present

## 2021-03-26 DIAGNOSIS — N1330 Unspecified hydronephrosis: Secondary | ICD-10-CM | POA: Diagnosis not present

## 2021-03-26 DIAGNOSIS — Z7952 Long term (current) use of systemic steroids: Secondary | ICD-10-CM

## 2021-03-26 LAB — I-STAT VENOUS BLOOD GAS, ED
Acid-Base Excess: 1 mmol/L (ref 0.0–2.0)
Bicarbonate: 26.4 mmol/L (ref 20.0–28.0)
Calcium, Ion: 1.19 mmol/L (ref 1.15–1.40)
HCT: 32 % — ABNORMAL LOW (ref 39.0–52.0)
Hemoglobin: 10.9 g/dL — ABNORMAL LOW (ref 13.0–17.0)
O2 Saturation: 51 %
Patient temperature: 97.9
Potassium: 4 mmol/L (ref 3.5–5.1)
Sodium: 143 mmol/L (ref 135–145)
TCO2: 28 mmol/L (ref 22–32)
pCO2, Ven: 43.9 mmHg — ABNORMAL LOW (ref 44.0–60.0)
pH, Ven: 7.385 (ref 7.250–7.430)
pO2, Ven: 27 mmHg — CL (ref 32.0–45.0)

## 2021-03-26 LAB — CBC WITH DIFFERENTIAL/PLATELET
Abs Immature Granulocytes: 0.02 10*3/uL (ref 0.00–0.07)
Basophils Absolute: 0.1 10*3/uL (ref 0.0–0.1)
Basophils Relative: 1 %
Eosinophils Absolute: 0.2 10*3/uL (ref 0.0–0.5)
Eosinophils Relative: 3 %
HCT: 36.5 % — ABNORMAL LOW (ref 39.0–52.0)
Hemoglobin: 11.1 g/dL — ABNORMAL LOW (ref 13.0–17.0)
Immature Granulocytes: 0 %
Lymphocytes Relative: 15 %
Lymphs Abs: 1.2 10*3/uL (ref 0.7–4.0)
MCH: 23.4 pg — ABNORMAL LOW (ref 26.0–34.0)
MCHC: 30.4 g/dL (ref 30.0–36.0)
MCV: 77 fL — ABNORMAL LOW (ref 80.0–100.0)
Monocytes Absolute: 0.7 10*3/uL (ref 0.1–1.0)
Monocytes Relative: 8 %
Neutro Abs: 6.2 10*3/uL (ref 1.7–7.7)
Neutrophils Relative %: 73 %
Platelets: 252 10*3/uL (ref 150–400)
RBC: 4.74 MIL/uL (ref 4.22–5.81)
RDW: 16.6 % — ABNORMAL HIGH (ref 11.5–15.5)
WBC: 8.4 10*3/uL (ref 4.0–10.5)
nRBC: 0 % (ref 0.0–0.2)

## 2021-03-26 LAB — LIPASE, BLOOD: Lipase: 42 U/L (ref 11–51)

## 2021-03-26 LAB — BLOOD GAS, VENOUS
Acid-Base Excess: 1.8 mmol/L (ref 0.0–2.0)
Bicarbonate: 27.4 mmol/L (ref 20.0–28.0)
O2 Saturation: 53.9 %
Patient temperature: 98.6
pCO2, Ven: 50.2 mmHg (ref 44.0–60.0)
pH, Ven: 7.356 (ref 7.250–7.430)
pO2, Ven: 31.6 mmHg — CL (ref 32.0–45.0)

## 2021-03-26 LAB — RESP PANEL BY RT-PCR (FLU A&B, COVID) ARPGX2
Influenza A by PCR: NEGATIVE
Influenza B by PCR: NEGATIVE
SARS Coronavirus 2 by RT PCR: NEGATIVE

## 2021-03-26 LAB — CBC
HCT: 36.4 % — ABNORMAL LOW (ref 39.0–52.0)
Hemoglobin: 11.1 g/dL — ABNORMAL LOW (ref 13.0–17.0)
MCH: 23.6 pg — ABNORMAL LOW (ref 26.0–34.0)
MCHC: 30.5 g/dL (ref 30.0–36.0)
MCV: 77.3 fL — ABNORMAL LOW (ref 80.0–100.0)
Platelets: 242 10*3/uL (ref 150–400)
RBC: 4.71 MIL/uL (ref 4.22–5.81)
RDW: 16.7 % — ABNORMAL HIGH (ref 11.5–15.5)
WBC: 8.5 10*3/uL (ref 4.0–10.5)
nRBC: 0 % (ref 0.0–0.2)

## 2021-03-26 LAB — URINALYSIS, ROUTINE W REFLEX MICROSCOPIC
Bilirubin Urine: NEGATIVE
Glucose, UA: NEGATIVE mg/dL
Ketones, ur: NEGATIVE mg/dL
Leukocytes,Ua: NEGATIVE
Nitrite: NEGATIVE
Specific Gravity, Urine: 1.022 (ref 1.005–1.030)
pH: 6 (ref 5.0–8.0)

## 2021-03-26 LAB — PROTIME-INR
INR: 1.2 (ref 0.8–1.2)
Prothrombin Time: 14.9 seconds (ref 11.4–15.2)

## 2021-03-26 LAB — TROPONIN I (HIGH SENSITIVITY)
Troponin I (High Sensitivity): 7 ng/L (ref <18)
Troponin I (High Sensitivity): 7 ng/L (ref <18)
Troponin I (High Sensitivity): 5 ng/L (ref <18)

## 2021-03-26 LAB — COMPREHENSIVE METABOLIC PANEL
ALT: 16 U/L (ref 0–44)
AST: 21 U/L (ref 15–41)
Albumin: 3.5 g/dL (ref 3.5–5.0)
Alkaline Phosphatase: 51 U/L (ref 38–126)
Anion gap: 9 (ref 5–15)
BUN: 17 mg/dL (ref 8–23)
CO2: 25 mmol/L (ref 22–32)
Calcium: 8.1 mg/dL — ABNORMAL LOW (ref 8.9–10.3)
Chloride: 110 mmol/L (ref 98–111)
Creatinine, Ser: 1.37 mg/dL — ABNORMAL HIGH (ref 0.61–1.24)
GFR, Estimated: 54 mL/min — ABNORMAL LOW (ref >60)
Glucose, Bld: 86 mg/dL (ref 70–99)
Potassium: 4 mmol/L (ref 3.5–5.1)
Sodium: 144 mmol/L (ref 135–145)
Total Bilirubin: 0.7 mg/dL (ref 0.3–1.2)
Total Protein: 5.7 g/dL — ABNORMAL LOW (ref 6.5–8.1)

## 2021-03-26 LAB — BRAIN NATRIURETIC PEPTIDE: B Natriuretic Peptide: 107.3 pg/mL — ABNORMAL HIGH (ref 0.0–100.0)

## 2021-03-26 LAB — LACTIC ACID, PLASMA: Lactic Acid, Venous: 0.8 mmol/L (ref 0.5–1.9)

## 2021-03-26 LAB — PHOSPHORUS: Phosphorus: 2.5 mg/dL (ref 2.5–4.6)

## 2021-03-26 LAB — MAGNESIUM: Magnesium: 2 mg/dL (ref 1.7–2.4)

## 2021-03-26 MED ORDER — ISOSORBIDE MONONITRATE ER 30 MG PO TB24
30.0000 mg | ORAL_TABLET | Freq: Every day | ORAL | Status: DC
  Administered 2021-03-26 – 2021-03-27 (×2): 30 mg via ORAL
  Filled 2021-03-26 (×2): qty 1

## 2021-03-26 MED ORDER — IOHEXOL 350 MG/ML SOLN
100.0000 mL | Freq: Once | INTRAVENOUS | Status: AC | PRN
  Administered 2021-03-26: 100 mL via INTRAVENOUS

## 2021-03-26 MED ORDER — SODIUM CHLORIDE 0.9% FLUSH: 3.0000 mL | INTRAVENOUS | Status: DC | PRN

## 2021-03-26 MED ORDER — ASPIRIN EC 81 MG PO TBEC
81.0000 mg | DELAYED_RELEASE_TABLET | Freq: Every day | ORAL | Status: DC
  Administered 2021-03-26 – 2021-03-27 (×2): 81 mg via ORAL
  Filled 2021-03-26 (×2): qty 1

## 2021-03-26 MED ORDER — SODIUM CHLORIDE 0.9% FLUSH
3.0000 mL | Freq: Two times a day (BID) | INTRAVENOUS | Status: DC
  Administered 2021-03-26 – 2021-03-29 (×6): 3 mL via INTRAVENOUS

## 2021-03-26 MED ORDER — HYDROMORPHONE HCL 1 MG/ML IJ SOLN
0.5000 mg | INTRAMUSCULAR | Status: DC | PRN
  Administered 2021-03-26 (×2): 0.5 mg via INTRAVENOUS
  Filled 2021-03-26 (×2): qty 1

## 2021-03-26 MED ORDER — ONDANSETRON HCL 4 MG/2ML IJ SOLN
4.0000 mg | Freq: Four times a day (QID) | INTRAMUSCULAR | Status: DC | PRN
  Administered 2021-03-27: 4 mg via INTRAVENOUS
  Filled 2021-03-26: qty 2

## 2021-03-26 MED ORDER — CARVEDILOL 25 MG PO TABS
25.0000 mg | ORAL_TABLET | Freq: Two times a day (BID) | ORAL | Status: DC
  Administered 2021-03-26 – 2021-03-29 (×6): 25 mg via ORAL
  Filled 2021-03-26 (×6): qty 1

## 2021-03-26 MED ORDER — ATORVASTATIN CALCIUM 20 MG PO TABS
20.0000 mg | ORAL_TABLET | Freq: Every day | ORAL | Status: DC
  Administered 2021-03-26 – 2021-03-28 (×3): 20 mg via ORAL
  Filled 2021-03-26 (×4): qty 1

## 2021-03-26 MED ORDER — FUROSEMIDE 10 MG/ML IJ SOLN
40.0000 mg | Freq: Two times a day (BID) | INTRAMUSCULAR | Status: DC
  Administered 2021-03-26 – 2021-03-28 (×4): 40 mg via INTRAVENOUS
  Filled 2021-03-26 (×4): qty 4

## 2021-03-26 MED ORDER — FUROSEMIDE 10 MG/ML IJ SOLN
40.0000 mg | Freq: Once | INTRAMUSCULAR | Status: AC
  Administered 2021-03-26: 40 mg via INTRAVENOUS
  Filled 2021-03-26: qty 4

## 2021-03-26 MED ORDER — IOHEXOL 300 MG/ML  SOLN: 100.0000 mL | Freq: Once | INTRAMUSCULAR | Status: DC | PRN

## 2021-03-26 MED ORDER — POTASSIUM CHLORIDE CRYS ER 20 MEQ PO TBCR
20.0000 meq | EXTENDED_RELEASE_TABLET | Freq: Every day | ORAL | Status: DC
  Administered 2021-03-26 – 2021-03-28 (×3): 20 meq via ORAL
  Filled 2021-03-26 (×3): qty 1

## 2021-03-26 MED ORDER — PANTOPRAZOLE SODIUM 40 MG PO TBEC
80.0000 mg | DELAYED_RELEASE_TABLET | Freq: Every day | ORAL | Status: DC
  Administered 2021-03-27: 80 mg via ORAL
  Filled 2021-03-26: qty 2

## 2021-03-26 MED ORDER — ACETAMINOPHEN 325 MG PO TABS
650.0000 mg | ORAL_TABLET | ORAL | Status: DC | PRN
  Administered 2021-03-27: 650 mg via ORAL
  Filled 2021-03-26: qty 2

## 2021-03-26 MED ORDER — SODIUM CHLORIDE 0.9 % IV SOLN: 250.0000 mL | INTRAVENOUS | Status: DC | PRN

## 2021-03-26 MED ORDER — HYDROMORPHONE HCL 1 MG/ML IJ SOLN: 0.5000 mg | INTRAMUSCULAR | Status: DC | PRN

## 2021-03-26 MED ORDER — INDAPAMIDE 1.25 MG PO TABS
1.2500 mg | ORAL_TABLET | Freq: Every day | ORAL | Status: DC
  Administered 2021-03-26 – 2021-03-27 (×2): 1.25 mg via ORAL
  Filled 2021-03-26 (×2): qty 1

## 2021-03-26 MED ORDER — APIXABAN 2.5 MG PO TABS
2.5000 mg | ORAL_TABLET | Freq: Two times a day (BID) | ORAL | Status: DC
  Administered 2021-03-26 – 2021-03-29 (×6): 2.5 mg via ORAL
  Filled 2021-03-26 (×6): qty 1

## 2021-03-26 MED ORDER — NITROGLYCERIN 2 % TD OINT
1.0000 [in_us] | TOPICAL_OINTMENT | Freq: Four times a day (QID) | TRANSDERMAL | Status: DC
  Administered 2021-03-26 – 2021-03-28 (×10): 1 [in_us] via TOPICAL
  Filled 2021-03-26 (×2): qty 1
  Filled 2021-03-26: qty 30

## 2021-03-26 NOTE — Plan of Care (Signed)
  Problem: Education: Goal: Knowledge of General Education information will improve Description: Including pain rating scale, medication(s)/side effects and non-pharmacologic comfort measures Outcome: Progressing   Problem: Coping: Goal: Level of anxiety will decrease Outcome: Progressing   Problem: Pain Managment: Goal: General experience of comfort will improve Outcome: Progressing   

## 2021-03-26 NOTE — ED Triage Notes (Addendum)
Paitent reports to the ER for SOB. Patient reports swelling in lower extremities x1 week. Patient denies hx of heart failure. Patient reports he does take blood pressure medication but stopped taking amlodopine x2 months ago Patient also endorses a fall last night without injury

## 2021-03-26 NOTE — Progress Notes (Signed)
Established Patient Office Visit  Subjective:  Patient ID: Bruce Ball, male    DOB: [birth year]  Age: 75 y.o. MRN: [id number]  CC:  Chief Complaint  Patient presents with  . Numbness  . Edema    Hands, legs, feet x 2weeks    HPI Bruce Ball presents for feet and leg swelling x 2 weeks. Here with his wife. Back from road trip one week ago - drove to [place] and [place], about 2400 miles total. He is short of breath today, feels weak and tired. Legs and feet are swollen and painful, and he says his weight keeps increasing. No history of heart failure per patient, but does have PE history. No chest pain. No vision changes or headaches. Using walker around the house, not normal for him. Napping more during the day, often in recliner.   Past Medical History:  Diagnosis Date  . CAD (coronary artery disease)   . Chronic back pain greater than 3 months duration   . Chronic leg pain   . Deafness in right ear   . Dyspnea    cause by PE  . Extrinsic asthma, unspecified 04/07/2009   PT DENIES  . GERD (gastroesophageal reflux disease)   . History of kidney stones   . HYPERTENSION 10/29/2007  . HYPOTENSION 08/13/2010  . LOW BACK PAIN 04/21/2008  . NEPHROLITHIASIS, HX OF 04/21/2008  . Pneumonia   . PUD, HX OF 04/21/2008  . Pulmonary emboli ([place]) 2021   noted on CT 12-2019  . Renal disorder     Past Surgical History:  Procedure Laterality Date  . BACK SURGERY    . bone cancer  1970   rt femur removed and steel rod placed  . CYSTOSCOPY WITH RETROGRADE PYELOGRAM, URETEROSCOPY AND STENT PLACEMENT Right 11/06/2017   Procedure: CYSTOSCOPY WITH  STENT PLACEMENT RIGHT;  Surgeon: Raynelle Bring, MD;  Location: [facility];  Service: Urology;  Laterality: Right;  . CYSTOSCOPY WITH RETROGRADE PYELOGRAM, URETEROSCOPY AND STENT PLACEMENT Left 06/09/2019   Procedure: CYSTOSCOPY WITH RETROGRADE PYELOGRAM, URETEROSCOPY AND STENT PLACEMENT X2;  Surgeon: [name], MD;  Location: [facility];   Service: Urology;  Laterality: Left;  . CYSTOSCOPY WITH RETROGRADE PYELOGRAM, URETEROSCOPY AND STENT PLACEMENT Bilateral 03/01/2020   Procedure: CYSTOSCOPY WITH RETROGRADE PYELOGRAM, URETEROSCOPY AND STENT PLACEMENT;  Surgeon: [name], MD;  Location: [facility];  Service: Urology;  Laterality: Bilateral;  75 MINS  . CYSTOSCOPY/RETROGRADE/URETEROSCOPY     1 year ago at [place] in [place]  . CYSTOSCOPY/URETEROSCOPY/HOLMIUM LASER Right 11/24/2017   Procedure: CYSTOSCOPY/URETEROSCOPY/ RETROGRADE/STENT REMOVAL;  Surgeon: Raynelle Bring, MD;  Location: [facility];  Service: Urology;  Laterality: Right;  . HOLMIUM LASER APPLICATION Bilateral 04/01/5955   Procedure: HOLMIUM LASER APPLICATION;  Surgeon: [name], MD;  Location: [facility];  Service: Urology;  Laterality: Bilateral;  . prosatectomy      Family History  Problem Relation Age of Onset  . COPD Father   . Cancer Other        prostate  . Prostate cancer Brother   . Esophageal cancer Neg Hx   . Stomach cancer Neg Hx   . Pancreatic cancer Neg Hx   . Colon cancer Neg Hx     Social History   Socioeconomic History  . Marital status: Married    Spouse name: Not on file  . Number of children: 2  . Years of education: Not on file  . Highest education level: Not on file  Occupational History  . Occupation: Optometrist  Tobacco Use  . Smoking status: Former Smoker    Packs/day: 1.00    Years: 17.00    Pack years: 17.00    Start date: 1963    Quit date: 12/30/1978    Years since quitting: 42.2  . Smokeless tobacco: Never Used  Vaping Use  . Vaping Use: Never used  Substance and Sexual Activity  . Alcohol use: No  . Drug use: No  . Sexual activity: Not on file  Other Topics Concern  . Not on file  Social History Narrative  . Not on file   Social Determinants of Health   Financial Resource Strain: Not on file  Food Insecurity: Not on file  Transportation Needs: Not on file  Physical Activity: Not on file  Stress: Not on  file  Social Connections: Not on file  Intimate Partner Violence: Not on file    Outpatient Medications Prior to Visit  Medication Sig Dispense Refill  . apixaban (ELIQUIS) 2.5 MG TABS tablet Take 1 tablet (2.5 mg total) by mouth 2 (two) times daily.    Marland Kitchen atorvastatin (LIPITOR) 20 MG tablet Take 20 mg at bedtime by mouth.     . carvedilol (COREG) 25 MG tablet Take 25 mg by mouth 2 (two) times daily.    . Cholecalciferol (VITAMIN D3) 50 MCG (2000 UT) capsule Take 2,000 Units by mouth in the morning and at bedtime.     . indapamide (LOZOL) 1.25 MG tablet Take 1.25 mg by mouth daily.    . isosorbide mononitrate (IMDUR) 30 MG 24 hr tablet Take 30 mg by mouth daily.     . metoCLOPramide (REGLAN) 5 MG tablet TAKE 1 TABLET (5 MG TOTAL) BY MOUTH 3 (THREE) TIMES DAILY BEFORE MEALS. 90 tablet 1  . omeprazole (PRILOSEC) 20 MG capsule Take 40 mg by mouth 2 (two) times daily.    . predniSONE (DELTASONE) 5 MG tablet Take 5 mg by mouth daily with breakfast.    . amLODipine (NORVASC) 2.5 MG tablet Take 2.5 mg by mouth daily. (Patient not taking: No sig reported)     No facility-administered medications prior to visit.    No Known Allergies  ROS Review of Systems REFER TO HPI FOR PERTINENT POSITIVES AND NEGATIVES    Objective:    Physical Exam Vitals and nursing note reviewed.  Constitutional:      Appearance: He is obese. He is diaphoretic.  HENT:     Head: Normocephalic.     Nose: Nose normal.     Mouth/Throat:     Mouth: Mucous membranes are moist.  Eyes:     Extraocular Movements: Extraocular movements intact.     Conjunctiva/sclera: Conjunctivae normal.     Pupils: Pupils are equal, round, and reactive to light.  Cardiovascular:     Rate and Rhythm: Normal rate and regular rhythm.  Pulmonary:     Effort: No respiratory distress.     Breath sounds: Decreased air movement present.  Musculoskeletal:     Right lower leg: 1+ Edema present.     Left lower leg: 2+ Edema present.   Neurological:     General: No focal deficit present.     Mental Status: He is alert and oriented to person, place, and time.  Psychiatric:        Mood and Affect: Mood normal.        Behavior: Behavior normal.     BP (!) 188/83   Pulse 66   Temp 98.2 F (36.8 C)  Ht 5\' 11"  (1.803 m)   Wt 261 lb 3.2 oz (118.5 kg)   SpO2 97%   BMI 36.43 kg/m  Wt Readings from Last 3 Encounters:  03/26/21 260 lb (117.9 kg)  03/26/21 261 lb 3.2 oz (118.5 kg)  01/30/21 250 lb (113.4 kg)     Health Maintenance Due  Topic Date Due  . COLONOSCOPY (Pts 45-40yrs Insurance coverage will need to be confirmed)  Never done    There are no preventive care reminders to display for this patient.  Lab Results  Component Value Date   TSH 0.825 02/07/2020   Lab Results  Component Value Date   WBC 8.5 03/26/2021   HGB 11.1 (L) 03/26/2021   HCT 36.4 (L) 03/26/2021   MCV 77.3 (L) 03/26/2021   PLT 242 03/26/2021   Lab Results  Component Value Date   NA 142 01/25/2021   K 3.6 01/25/2021   CO2 31 01/25/2021   GLUCOSE 128 (H) 01/25/2021   BUN 17 01/25/2021   CREATININE 1.67 (H) 01/25/2021   BILITOT 0.7 04/04/2020   ALKPHOS 67 04/04/2020   AST 20 04/04/2020   ALT 21 04/04/2020   PROT 5.9 (L) 04/04/2020   ALBUMIN 3.7 04/04/2020   CALCIUM 9.0 01/25/2021   ANIONGAP 7 01/25/2021   GFR 38.57 (L) 04/04/2020   Lab Results  Component Value Date   CHOL 104 11/30/2019   Lab Results  Component Value Date   HDL 56 11/30/2019   Lab Results  Component Value Date   LDLCALC 38 11/30/2019   Lab Results  Component Value Date   TRIG 49 11/30/2019   Lab Results  Component Value Date   CHOLHDL 4.5 05/18/2016   Lab Results  Component Value Date   HGBA1C 5.5 02/07/2020      Assessment & Plan:   Problem List Items Addressed This Visit      Other   Peripheral edema    Other Visit Diagnoses    SOB (shortness of breath)    -  Primary   Hypertensive urgency          No orders of the  defined types were placed in this encounter.   Follow-up: No follow-ups on file.   1. SOB (shortness of breath) 2. Peripheral edema 3. Hypertensive urgency Presentation today is very concerning for acute cardiac etiology. Expressed my concerns to patient and wife about possible acute CHF and the underlying cause for that - they are agreeable and understanding about going to the ED for full work-up today. Wife will drive him to the nearest ED and will f/up with PCP Dr. Yong Channel here once appropriate.  This visit occurred during the SARS-CoV-2 public health emergency.  Safety protocols were in place, including screening questions prior to the visit, additional usage of staff PPE, and extensive cleaning of exam room while observing appropriate contact time as indicated for disinfecting solutions.    Yoland Scherr M Erionna Strum, PA-C

## 2021-03-26 NOTE — H&P (Signed)
History and Physical    Bruce Ball:[id number] DOB: 1946/10/24 DOA: 03/26/2021  PCP: [name], MD   Patient coming from: Home via [facility]  Chief Complaint: SOB, edema of legs  HPI: Bruce Ball is a 75 y.o. male with medical history significant for CAD, HTN, DDD with chronic back pain, hx of PE in 2021, deafness in right ear, CKD 3 who presents for evaluation of leg swelling with shortness of breath with exertion and generalized weakness.  Reports he has had swelling in his legs is increasingly gotten worse over the last 2 weeks.  He returned on account of from a road trip week ago in which he drove through the [place] traveling approximately 2400 miles.  States he had mild swelling starting when he went on the trip is worsened in the last week since getting home.  He has generalized weakness and has had to use a walker when ambulating in the house which is not normal for him.  He also reports he feels smothered if he lays flat so is been sleeping sitting up in a recliner for the last few days.  States he has been napping more during the day for the past few days which is unusual for him.  Reports he has not changed his diet but has been gaining weight since the swelling started.  He denies any chest pain, pressure or palpitations.  Shortness of breath is worsened with exertion and improves if he sits down to rest.  He has not had any fever, cough, abdominal pain, nausea, vomiting, diarrhea, dysuria, urinary frequency, LOC.  He does have chronic back pain which is been more pronounced the last few days. Patient is on Eliquis for anticoagulation since having the pulmonary embolism in January 2021.  He states he has not missed any doses.  ED Course:   In the emergency room patient did have a mildly elevated BNP at 107.  Not have pulmonary edema on chest x-ray.  CT angiography of chest showed no pulmonary embolism.  He did have bilateral small pleural effusions  with no atelectasis.  No infiltrates or consolidations were noted.  Review of Systems:  General: Reports generalized weakness. Denies fever, chills, weight loss, night sweats.  Denies dizziness.  Denies change in appetite HENT: Denies head trauma, headache, denies change in hearing, tinnitus.  Denies nasal bleeding.  Denies sore throat, sores in mouth.  Denies difficulty swallowing Eyes: Denies blurry vision, pain in eye, drainage.  Denies discoloration of eyes. Neck: Denies pain.  Denies swelling.  Denies pain with movement. Cardiovascular: Denies chest pain, palpitations.  Reports edema. Reports orthopnea Respiratory: Reports shortness of breath with exertion. Denies cough.  Denies wheezing.  Denies sputum production Gastrointestinal: Denies abdominal pain, swelling. Denies nausea, vomiting, diarrhea. Denies melena. Denies hematemesis. Musculoskeletal: reports mid to low back pain, chronic. Denies limitation of movement.  Denies deformity.  Genitourinary: Denies pelvic pain.  Denies urinary frequency or hesitancy.  Denies dysuria.  Skin: Denies rash.  Denies petechiae, purpura, ecchymosis. Neurological: Denies headache. Denies syncope. Denies seizure activity. Denies paresthesia. Denies slurred speech, drooping face.  Denies visual change. Psychiatric: Denies depression, anxiety. Denies hallucinations.  Past Medical History:  Diagnosis Date  . CAD (coronary artery disease)   . Chronic back pain greater than 3 months duration   . Chronic leg pain   . Deafness in right ear   . Dyspnea    cause by PE  . Extrinsic asthma, unspecified 04/07/2009  PT DENIES  . GERD (gastroesophageal reflux disease)   . History of kidney stones   . HYPERTENSION 10/29/2007  . HYPOTENSION 08/13/2010  . LOW BACK PAIN 04/21/2008  . NEPHROLITHIASIS, HX OF 04/21/2008  . Pneumonia   . PUD, HX OF 04/21/2008  . Pulmonary emboli ([place]) 2021   noted on CT 12-2019  . Renal disorder     Past Surgical History:   Procedure Laterality Date  . BACK SURGERY    . bone cancer  1970   rt femur removed and steel rod placed  . CYSTOSCOPY WITH RETROGRADE PYELOGRAM, URETEROSCOPY AND STENT PLACEMENT Right 11/06/2017   Procedure: CYSTOSCOPY WITH  STENT PLACEMENT RIGHT;  Surgeon: Raynelle Bring, MD;  Location: [facility];  Service: Urology;  Laterality: Right;  . CYSTOSCOPY WITH RETROGRADE PYELOGRAM, URETEROSCOPY AND STENT PLACEMENT Left 06/09/2019   Procedure: CYSTOSCOPY WITH RETROGRADE PYELOGRAM, URETEROSCOPY AND STENT PLACEMENT X2;  Surgeon: [name], MD;  Location: [facility];  Service: Urology;  Laterality: Left;  . CYSTOSCOPY WITH RETROGRADE PYELOGRAM, URETEROSCOPY AND STENT PLACEMENT Bilateral 03/01/2020   Procedure: CYSTOSCOPY WITH RETROGRADE PYELOGRAM, URETEROSCOPY AND STENT PLACEMENT;  Surgeon: [name], MD;  Location: [facility];  Service: Urology;  Laterality: Bilateral;  75 MINS  . CYSTOSCOPY/RETROGRADE/URETEROSCOPY     1 year ago at [place] in [place]  . CYSTOSCOPY/URETEROSCOPY/HOLMIUM LASER Right 11/24/2017   Procedure: CYSTOSCOPY/URETEROSCOPY/ RETROGRADE/STENT REMOVAL;  Surgeon: Raynelle Bring, MD;  Location: [facility];  Service: Urology;  Laterality: Right;  . HOLMIUM LASER APPLICATION Bilateral 01/04/1760   Procedure: HOLMIUM LASER APPLICATION;  Surgeon: [name], MD;  Location: [facility];  Service: Urology;  Laterality: Bilateral;  . prosatectomy      Social History  reports that he quit smoking about 42 years ago. He started smoking about 59 years ago. He has a 17.00 pack-year smoking history. He has never used smokeless tobacco. He reports that he does not drink alcohol and does not use drugs.  No Known Allergies  Family History  Problem Relation Age of Onset  . COPD Father   . Cancer Other        prostate  . Prostate cancer Brother   . Esophageal cancer Neg Hx   . Stomach cancer Neg Hx   . Pancreatic cancer Neg Hx   . Colon cancer Neg Hx      Prior to Admission medications    Medication Sig Start Date End Date Taking? Authorizing Provider  apixaban (ELIQUIS) 2.5 MG TABS tablet Take 1 tablet (2.5 mg total) by mouth 2 (two) times daily. 01/25/21  Yes Gorsuch, Ni, MD  atorvastatin (LIPITOR) 20 MG tablet Take 20 mg at bedtime by mouth.    Yes [provider]  carvedilol (COREG) 25 MG tablet Take 25 mg by mouth 2 (two) times daily.   Yes [provider]  Cholecalciferol (VITAMIN D3) 50 MCG (2000 UT) capsule Take 2,000 Units by mouth in the morning and at bedtime.    Yes [provider]  isosorbide mononitrate (IMDUR) 30 MG 24 hr tablet Take 30 mg by mouth daily.    Yes [provider]  omeprazole (PRILOSEC) 20 MG capsule Take 40 mg by mouth 2 (two) times daily.   Yes [provider]  predniSONE (DELTASONE) 5 MG tablet Take 5 mg by mouth daily with breakfast.   Yes [provider]  amLODipine (NORVASC) 2.5 MG tablet Take 2.5 mg by mouth daily. Patient not taking: No sig reported    [provider]  indapamide (LOZOL)  1.25 MG tablet Take 1.25 mg by mouth daily. 03/09/20   [provider]  metoCLOPramide (REGLAN) 5 MG tablet TAKE 1 TABLET (5 MG TOTAL) BY MOUTH 3 (THREE) TIMES DAILY BEFORE MEALS. 04/24/20   Armbruster, Carlota Raspberry, MD    Physical Exam: Vitals:   03/26/21 1730 03/26/21 1800 03/26/21 1815 03/26/21 1929  BP: (!) 159/93 137/81 (!) 156/87 (!) 164/82  Pulse: 60 (!) 58 (!) 58 61  Resp: 15 16 13 20   Temp:    98.1 F (36.7 C)  TempSrc:    Oral  SpO2: 97% 97% 96% 95%  Weight:      Height:        Constitutional: NAD, calm, comfortable Vitals:   03/26/21 1730 03/26/21 1800 03/26/21 1815 03/26/21 1929  BP: (!) 159/93 137/81 (!) 156/87 (!) 164/82  Pulse: 60 (!) 58 (!) 58 61  Resp: 15 16 13 20   Temp:    98.1 F (36.7 C)  TempSrc:    Oral  SpO2: 97% 97% 96% 95%  Weight:      Height:       General: WDWN, Alert and oriented x3.  Eyes: EOMI, PERRL, conjunctivae normal.  Sclera  nonicteric HENT:  [place]/AT, external ears normal.  Nares patent without epistasis.  Mucous membranes are moist. Hard of hearing Neck: Soft, normal range of motion, supple, no masses, no thyromegaly.  Trachea midline Respiratory: clear to auscultation bilaterally, no wheezing, no crackles. Normal respiratory effort. No accessory muscle use.  Cardiovascular: Regular rate and rhythm, no murmurs / rubs / gallops. +2 bilateral lower extremity edema. 2+ pedal pulses. Abdomen: Soft, no tenderness, nondistended, no rebound or guarding. Obese. No masses palpated. Bowel sounds normoactive Musculoskeletal: FROM. no cyanosis. No joint deformity upper and lower extremities. Normal muscle tone.  Skin: Warm, dry, intact no rashes, lesions, ulcers. No induration Neurologic: CN 2-12 grossly intact.  Normal speech.  Sensation intact,  Strength 4/5 in all extremities.   Psychiatric: Normal judgment and insight.  Normal mood.    Labs on Admission: I have personally reviewed following labs and imaging studies  CBC: Recent Labs  Lab 03/26/21 1230 03/26/21 1235 03/26/21 1313  WBC 8.4 8.5  --   NEUTROABS 6.2  --   --   HGB 11.1* 11.1* 10.9*  HCT 36.5* 36.4* 32.0*  MCV 77.0* 77.3*  --   PLT 252 242  --     Basic Metabolic Panel: Recent Labs  Lab 03/26/21 1230 03/26/21 1313  NA 144 143  K 4.0 4.0  CL 110  --   CO2 25  --   GLUCOSE 86  --   BUN 17  --   CREATININE 1.37*  --   CALCIUM 8.1*  --   MG 2.0  --   PHOS 2.5  --     GFR: Estimated Creatinine Clearance: 61.8 mL/min (A) (by C-G formula based on SCr of 1.37 mg/dL (H)).  Liver Function Tests: Recent Labs  Lab 03/26/21 1230  AST 21  ALT 16  ALKPHOS 51  BILITOT 0.7  PROT 5.7*  ALBUMIN 3.5    Urine analysis:    Component Value Date/Time   COLORURINE YELLOW 03/26/2021 1359   APPEARANCEUR CLEAR 03/26/2021 1359   LABSPEC 1.022 03/26/2021 1359   PHURINE 6.0 03/26/2021 1359   GLUCOSEU NEGATIVE 03/26/2021 1359   HGBUR MODERATE (A)  03/26/2021 1359   HGBUR large 02/26/2010 1214   BILIRUBINUR NEGATIVE 03/26/2021 1359   BILIRUBINUR 1+ 06/26/2016 1117   [place] 03/26/2021  [place] (A) 03/26/2021 1359   UROBILINOGEN 1.0 06/26/2016 1117   UROBILINOGEN 1.0 03/05/2010 2252   NITRITE NEGATIVE 03/26/2021 1359   LEUKOCYTESUR NEGATIVE 03/26/2021 1359    Radiological Exams on Admission: DG Chest 2 View  Result Date: 03/26/2021 CLINICAL DATA:  Bilateral extremity swelling with shortness of breath for 2 weeks. History of congestive heart failure. EXAM: CHEST - 2 VIEW COMPARISON:  Radiographs 11/24/2020 and 01/13/2020.  CT 04/02/2020. FINDINGS: The heart size and mediastinal contours are stable. There is a stable calcified left upper lobe granuloma and calcified left hilar lymph nodes. The lungs are otherwise clear. There is blunting of both costophrenic angles posteriorly on the lateral view, consistent with small bilateral pleural effusions. No pneumothorax. The bones appear unchanged with changes of diffuse idiopathic skeletal hyperostosis in the thoracic spine. IMPRESSION: 1. Small bilateral pleural effusions. No acute cardiopulmonary process. 2. Old granulomatous disease. Electronically Signed   By: Richardean Sale M.D.   On: 03/26/2021 13:03   CT Angio Chest PE W/Cm &/Or Wo Cm  Result Date: 03/26/2021 CLINICAL DATA:  PE suspected. High probability. Swelling in lower extremities for 1 week. Fall last night without injury. EXAM: CT ANGIOGRAPHY CHEST WITH CONTRAST TECHNIQUE: Multidetector CT imaging of the chest was performed using the standard protocol during bolus administration of intravenous contrast. Multiplanar CT image reconstructions and MIPs were obtained to evaluate the vascular anatomy. CONTRAST:  142mL OMNIPAQUE IOHEXOL 350 MG/ML SOLN COMPARISON:  CT chest with contrast 04/02/2020 FINDINGS: Cardiovascular: Heart size is normal. Coronary artery calcifications are present. Atherosclerotic changes are  noted at the aortic arch. Great vessel origins are within normal limits. Pulmonary artery opacification is excellent. No focal filling defects are present to suggest pulmonary emboli. Mediastinum/Nodes: And no significant mediastinal, hilar, or axillary adenopathy is present. Thoracic inlet is within normal limits. The esophagus is unremarkable. Lungs/Pleura: Bilateral pleural effusions present. Minimal atelectasis is associated. No significant airspace disease is present otherwise. Calcified granuloma in the left upper lobe sub is stable Upper Abdomen: Limited imaging the abdomen is unremarkable. There is no significant adenopathy. No solid organ lesions are present. Musculoskeletal: No anterior osteophytes are fused through the thoracic spine. Vertebral body heights are maintained. No significant listhesis is present. No focal lytic or blastic lesions are present. Review of the MIP images confirms the above findings. IMPRESSION: 1. No evidence for pulmonary embolus. 2. Bilateral pleural effusions with minimal atelectasis. 3. Coronary artery disease. 4. Fusion of the anterior osteophytes through the thoracic spine compatible with diffuse idiopathic skeletal hyperostosis. 5. Aortic Atherosclerosis (ICD10-I70.0). Electronically Signed   By: San Morelle M.D.   On: 03/26/2021 16:07   CT ANGIO AO+BIFEM W & OR WO CONTRAST  Result Date: 03/26/2021 CLINICAL DATA:  75 year old male with a one-week history of lower extremity swelling and additional symptoms concerning for possible pulmonary embolus EXAM: CT ANGIOGRAPHY OF ABDOMINAL AORTA WITH ILIOFEMORAL RUNOFF TECHNIQUE: Multidetector CT imaging of the abdomen, pelvis and lower extremities was performed using the standard protocol during bolus administration of intravenous contrast. Multiplanar CT image reconstructions and MIPs were obtained to evaluate the vascular anatomy. CONTRAST:  150mL OMNIPAQUE IOHEXOL 350 MG/ML SOLN COMPARISON:  Prior CT scan of the  abdomen and pelvis 04/02/2020 FINDINGS: VASCULAR Aorta: Normal caliber aorta without aneurysm, dissection, vasculitis or significant stenosis. Mild calcified atherosclerotic plaque along the abdominal aorta. Celiac: Patent without evidence of aneurysm, dissection, vasculitis or significant stenosis. SMA: Patent without evidence of aneurysm, dissection, vasculitis or significant stenosis. Renals: Both renal  arteries are patent without evidence of aneurysm, dissection, vasculitis, fibromuscular dysplasia or significant stenosis. IMA: Patent without evidence of aneurysm, dissection, vasculitis or significant stenosis. RIGHT Lower Extremity Inflow: Common, internal and external iliac arteries are patent without evidence of aneurysm, dissection, vasculitis or significant stenosis. Outflow: Common, superficial and profunda femoral arteries and the popliteal artery are patent without evidence of aneurysm, dissection, vasculitis or significant stenosis. Runoff: Limited evaluation secondary to venous contamination. Grossly patent 3 vessel runoff to the ankle. LEFT Lower Extremity Inflow: Common, internal and external iliac arteries are patent without evidence of aneurysm, dissection, vasculitis or significant stenosis. Outflow: Common, superficial and profunda femoral arteries and the popliteal artery are patent without evidence of aneurysm, dissection, vasculitis or significant stenosis. Runoff: Limited evaluation secondary to venous contamination. Grossly patent 3 vessel runoff to the ankle. Veins: No focal venous abnormality. Review of the MIP images confirms the above findings. NON-VASCULAR Lower chest: Small bilateral pleural effusions. Hepatobiliary: No acute abnormality within the visualized portion of the liver. Pancreas: No acute abnormality in the visualized portions of the pancreas. Spleen: No acute abnormality in the visualized portions of the spleen. Adrenals/Urinary Tract: Unremarkable adrenal glands. No  evidence of hydronephrosis. No evidence of enhancing renal mass. Multifocal nephrolithiasis bilaterally. The ureters and bladder are unremarkable. Stomach/Bowel: No evidence of bowel obstruction. No focal bowel wall thickening. Scattered colonic diverticula without evidence of active inflammation. Lymphatic: No suspicious lymphadenopathy. Reproductive: Surgical changes of prior prostatectomy. Other: No abdominal wall hernia or abnormality. No abdominopelvic ascites. Musculoskeletal: Surgical changes of prior L4-L5 posterior lumbar interbody fusion with interbody graft. Additional surgical changes of prior long-stem right hip unipolar arthroplasty. IMPRESSION: VASCULAR 1. Mild scattered atherosclerotic plaque without significant stenosis, occlusion, aneurysm or dissection. 2. No findings concerning for caval, iliac or femoral DVT. 3.  Aortic Atherosclerosis (ICD10-I70.0). NON-VASCULAR 1. Small bilateral pleural effusions. 2. Multifocal bilateral nephrolithiasis without evidence of obstruction. 3. Colonic diverticular disease without CT evidence of active inflammation. 4. Surgical changes of prior L4-L5 posterior lumbar interbody fusion with interbody graft. 5. Surgical changes of right long-stem unipolar hip arthroplasty prosthesis. Electronically Signed   By: Jacqulynn Cadet M.D.   On: 03/26/2021 15:41    EKG: Independently reviewed.  EKG shows normal sinus rhythm with no acute ST elevation or depression.  QTc 433  Assessment/Plan Principal Problem:   CHF exacerbation   Bruce Ball is admitted to Telemetry unit. Has mildly elevated BNP level and has bilateral edema of legs in setting of SOB with exertion and orthopnea. Diurese with Lasix 40 mg IV twice daily.  Supplemental potassium provided Monitor I and O's and daily weight.  Check serial troponin Check echocardiogram in the morning to evaluate wall motion, valvular function and EF. Continue Coreg, Imdur. Continue statin therapy with lipitor.   ACEI/ARB therapy held with CKD 3.  Incentive spirometer every 2 hours while awake.  Active Problems:   Essential hypertension Continue Coreg, Imdur. Monitor BP. If BP elevated may need to add ACE/ARB therapy vs Entresto to regimen.    Peripheral edema Diurese with Lasix.    Chronic kidney disease (CKD), stage III (moderate)  Stable. Monitor electrolytes renal function with labs in morning    DDD (degenerative disc disease), thoracic Chronic back pain secondary to degenerative disc disease.  Pain control provided.    Shortness of breath    Chronic anticoagulation Patient is on Eliquis for history of PE a year ago.  Eliquis will be continued     Generalized weakness Consult PT/OT for evaluation  morning    DVT prophylaxis: Patient is anticoagulated on Eliquis which will be continued Code Status:   Full code Family Communication:  Diagnosis and plan discussed with patient.  Patient verbalized understanding agrees with plan.  Further recommendations to follow as clinical indicated Disposition Plan:   Patient is from:  Home  Anticipated DC to:  Home  Anticipated DC date:  Anticipate more than 2 midnights in the hospital to treat acute condition  Anticipated DC barriers: No barriers to discharge identified at this time  Admission status:  Inpatient   Yevonne Aline Grant Swager MD [facility]  How to contact the [facility] Attending or Consulting provider [place] or covering provider during after hours [place], for this patient?   1. Check the care team in [facility] and look for a) attending/consulting [facility] provider listed and b) the [facility] team listed 2. Log into [email or web address] and use [place]'s universal password to access. If you do not have the password, please contact the hospital operator. 3. Locate the [facility] provider you are looking for under [facility] and page to a number that you can be directly reached. 4. If you still have difficulty reaching the provider, please page the  [facility] (Director on Call) for the Hospitalists listed on amion for assistance.  03/26/2021, 8:22 PM

## 2021-03-26 NOTE — Progress Notes (Signed)
Got a call from EDP at [facility] ED Patient is a 75 year old male who presented to the ED with dyspnea, bilateral lower extremity swelling for 2 weeks. Back from road trip a week ago drove [place] and [place] about 2400 miles Hx of DVT on Eliquis No hx of CHF  CT angio of chest negative for pulmonary embolism. Likely diagnosis acute exacerbation of CHF. Started on IV Lasix. Accepted to progressive bed

## 2021-03-26 NOTE — Patient Instructions (Signed)
Please go to nearest emergency department for full work-up today. I am concerned about new acute congestive heart failure (recent 10 day road trip 2400 miles, new edema and SOB, elevated BP, weight gain of 11 lbs in last month) ... will need to do cardiac work-up. Also with history of PE, need to rule this out as well.

## 2021-03-26 NOTE — ED Provider Notes (Signed)
[facility] EMERGENCY DEPT Provider Note   CSN: [id number] Arrival date & time: 03/26/21  1212     History Chief Complaint  Patient presents with  . Shortness of Breath    Bruce Ball is a 75 y.o. male.  HPI Patient reports he returned home from a long car trip 1 week ago.  He drove about 2400 miles to [place] in [place] and back.  He reports since that trip, he has had a lot of swelling in his legs and abdomen.  He reports he is tried to elevate his legs with no relief of the symptoms.  He is also more short of breath.  He denies any chest pain.  No fever no productive cough.  Patient denies any prior history of congestive heart failure.  He does have history of DVT and PE.  He takes Eliquis regularly.  He has history of hypertension and is compliant with medications.  He reports his blood pressures typically run in the 654Y systolic.  He had an appointment with his PCP this morning.  He reports he was very surprised to see his blood pressure elevated to the 503T systolic.  Patient denies has been have any problems with blurred vision or change in vision or headaches.  He denies he experienced any chest pain over the course of his travels.  He denied dietary changes.  He reports he typically eats 2 meals a day.  He is something for breakfast and something at lunch but does not eat dinner.  After evaluation in the office, patient was referred to the emergency department for further evaluation for possible new onset CHF, DVT\PE.    Past Medical History:  Diagnosis Date  . CAD (coronary artery disease)   . Chronic back pain greater than 3 months duration   . Chronic leg pain   . Deafness in right ear   . Dyspnea    cause by PE  . Extrinsic asthma, unspecified 04/07/2009   PT DENIES  . GERD (gastroesophageal reflux disease)   . History of kidney stones   . HYPERTENSION 10/29/2007  . HYPOTENSION 08/13/2010  . LOW BACK PAIN 04/21/2008  . NEPHROLITHIASIS, HX OF 04/21/2008   . Pneumonia   . PUD, HX OF 04/21/2008  . Pulmonary emboli ([place]) 2021   noted on CT 12-2019  . Renal disorder     Patient Active Problem List   Diagnosis Date Noted  . Anemia in chronic kidney disease 01/25/2021  . Acquired coagulation disorder ([place]) 12/12/2020  . Senile purpura ([place]) 12/12/2020  . Thin skin 07/25/2020  . Aortic atherosclerosis ([place]) 05/17/2020  . B12 deficiency 05/17/2020  . Iliac artery aneurysm ([place]) 05/17/2020  . Osteoporosis 04/04/2020  . Idiopathic neuropathy 01/28/2020  . Recurrent falls 01/28/2020  . Chronic kidney disease (CKD), stage III (moderate) ([place]) 01/19/2020  . Pulmonary emboli ([place]) 01/14/2020  . GERD (gastroesophageal reflux disease) 12/22/2019  . Essential tremor 12/22/2019  . Femoral condyle fracture ([place]) 07/15/2019  . Gross hematuria 06/14/2019  . Hyperlipidemia 11/11/2017  . Community acquired pneumonia of right upper lobe of lung 11/05/2017  . Right ureteral stone 11/05/2017  . History of total right hip replacement 03/08/2017  . History of prostate cancer 03/08/2017  . 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 High risk medication use 02/28/2017  . DDD (degenerative disc disease), thoracic 02/28/2017  . Ataxia 05/18/2016  . Vestibular disequilibrium 05/18/2016  . Ankylosing spondylitis ([place]) 05/24/2014  . Lung nodule, solitary 05/24/2014  . Peripheral edema 06/08/2012  .  Extrinsic asthma 04/07/2009  . LOW BACK PAIN 04/21/2008  . History of peptic ulcer disease 04/21/2008  . Essential hypertension 10/29/2007    Past Surgical History:  Procedure Laterality Date  . BACK SURGERY    . bone cancer  1970   rt femur removed and steel rod placed  . CYSTOSCOPY WITH RETROGRADE PYELOGRAM, URETEROSCOPY AND STENT PLACEMENT Right 11/06/2017   Procedure: CYSTOSCOPY WITH  STENT PLACEMENT RIGHT;  Surgeon: Raynelle Bring, MD;  Location: [facility];  Service: Urology;  Laterality: Right;  . CYSTOSCOPY WITH RETROGRADE PYELOGRAM, URETEROSCOPY AND STENT  PLACEMENT Left 06/09/2019   Procedure: CYSTOSCOPY WITH RETROGRADE PYELOGRAM, URETEROSCOPY AND STENT PLACEMENT X2;  Surgeon: [name], MD;  Location: [facility];  Service: Urology;  Laterality: Left;  . CYSTOSCOPY WITH RETROGRADE PYELOGRAM, URETEROSCOPY AND STENT PLACEMENT Bilateral 03/01/2020   Procedure: CYSTOSCOPY WITH RETROGRADE PYELOGRAM, URETEROSCOPY AND STENT PLACEMENT;  Surgeon: [name], MD;  Location: [facility];  Service: Urology;  Laterality: Bilateral;  75 MINS  . CYSTOSCOPY/RETROGRADE/URETEROSCOPY     1 year ago at [place] in [place]  . CYSTOSCOPY/URETEROSCOPY/HOLMIUM LASER Right 11/24/2017   Procedure: CYSTOSCOPY/URETEROSCOPY/ RETROGRADE/STENT REMOVAL;  Surgeon: Raynelle Bring, MD;  Location: [facility];  Service: Urology;  Laterality: Right;  . HOLMIUM LASER APPLICATION Bilateral 02/07/5283   Procedure: HOLMIUM LASER APPLICATION;  Surgeon: [name], MD;  Location: [facility];  Service: Urology;  Laterality: Bilateral;  . prosatectomy         Family History  Problem Relation Age of Onset  . COPD Father   . Cancer Other        prostate  . Prostate cancer Brother   . Esophageal cancer Neg Hx   . Stomach cancer Neg Hx   . Pancreatic cancer Neg Hx   . Colon cancer Neg Hx     Social History   Tobacco Use  . Smoking status: Former Smoker    Packs/day: 1.00    Years: 17.00    Pack years: 17.00    Start date: 1963    Quit date: 12/30/1978    Years since quitting: 42.2  . Smokeless tobacco: Never Used  Vaping Use  . Vaping Use: Never used  Substance Use Topics  . Alcohol use: No  . Drug use: No    Home Medications Prior to Admission medications   Medication Sig Start Date End Date Taking? Authorizing Provider  apixaban (ELIQUIS) 2.5 MG TABS tablet Take 1 tablet (2.5 mg total) by mouth 2 (two) times daily. 01/25/21  Yes Gorsuch, Ni, MD  atorvastatin (LIPITOR) 20 MG tablet Take 20 mg at bedtime by mouth.    Yes [provider]  carvedilol (COREG) 25 MG  tablet Take 25 mg by mouth 2 (two) times daily.   Yes [provider]  Cholecalciferol (VITAMIN D3) 50 MCG (2000 UT) capsule Take 2,000 Units by mouth in the morning and at bedtime.    Yes [provider]  isosorbide mononitrate (IMDUR) 30 MG 24 hr tablet Take 30 mg by mouth daily.    Yes [provider]  omeprazole (PRILOSEC) 20 MG capsule Take 40 mg by mouth 2 (two) times daily.   Yes [provider]  predniSONE (DELTASONE) 5 MG tablet Take 5 mg by mouth daily with breakfast.   Yes [provider]  amLODipine (NORVASC) 2.5 MG tablet Take 2.5 mg by mouth daily. Patient not taking: No sig reported    [provider]  indapamide (LOZOL) 1.25 MG tablet Take 1.25 mg by mouth  daily. 03/09/20   [provider]  metoCLOPramide (REGLAN) 5 MG tablet TAKE 1 TABLET (5 MG TOTAL) BY MOUTH 3 (THREE) TIMES DAILY BEFORE MEALS. 04/24/20   Armbruster, Carlota Raspberry, MD    Allergies    Patient has no known allergies.  Review of Systems   Review of Systems 10 systems reviewed and negative except as per HPI Physical Exam Updated Vital Signs BP (!) 187/89   Pulse (!) 57   Temp 97.9 F (36.6 C)   Resp 16   Ht 5\' 11"  (1.803 m)   Wt 117.9 kg   SpO2 100%   BMI 36.26 kg/m   Physical Exam Constitutional:      Comments: Alert and nontoxic.  Mental status clear.  Mild increased work of breathing at rest.  HENT:     Head: Normocephalic and atraumatic.     Mouth/Throat:     Pharynx: Oropharynx is clear.  Eyes:     Extraocular Movements: Extraocular movements intact.     Conjunctiva/sclera: Conjunctivae normal.  Cardiovascular:     Rate and Rhythm: Normal rate and regular rhythm.  Pulmonary:     Comments: Mild increased work of breathing at rest.  Breath sounds are clear bilaterally.  Patient has good airflow to the bases. Abdominal:     Comments: Abdomen is moderately distended and turgid.  Patient denies abdominal pain.  He reports this is  atypical for him to have this much abdominal fullness  Musculoskeletal:     Comments: 3+ pitting edema bilateral lower extremities.  Lower extremities and feet are in good condition with skin without wounds or cellulitis.  Skin:    General: Skin is warm and dry.  Neurological:     General: No focal deficit present.     Mental Status: He is oriented to person, place, and time.     Cranial Nerves: No cranial nerve deficit.     Coordination: Coordination normal.  Psychiatric:        Mood and Affect: Mood normal.     ED Results / Procedures / Treatments   Labs (all labs ordered are listed, but only abnormal results are displayed) Labs Reviewed  CBC - Abnormal; Notable for the following components:      Result Value   Hemoglobin 11.1 (*)    HCT 36.4 (*)    MCV 77.3 (*)    MCH 23.6 (*)    RDW 16.7 (*)    All other components within normal limits  COMPREHENSIVE METABOLIC PANEL - Abnormal; Notable for the following components:   Creatinine, Ser 1.37 (*)    Calcium 8.1 (*)    Total Protein 5.7 (*)    GFR, Estimated 54 (*)    All other components within normal limits  BRAIN NATRIURETIC PEPTIDE - Abnormal; Notable for the following components:   B Natriuretic Peptide 107.3 (*)    All other components within normal limits  CBC WITH DIFFERENTIAL/PLATELET - Abnormal; Notable for the following components:   Hemoglobin 11.1 (*)    HCT 36.5 (*)    MCV 77.0 (*)    MCH 23.4 (*)    RDW 16.6 (*)    All other components within normal limits  URINALYSIS, ROUTINE W REFLEX MICROSCOPIC - Abnormal; Notable for the following components:   Hgb urine dipstick MODERATE (*)    Protein, ur TRACE (*)    All other components within normal limits  I-STAT VENOUS BLOOD GAS, ED - Abnormal; Notable for the following components:   pCO2, Lawson Fiscal  43.9 (*)    pO2, Ven 27.0 (*)    HCT 32.0 (*)    Hemoglobin 10.9 (*)    All other components within normal limits  RESP PANEL BY RT-PCR (FLU A&B, COVID) ARPGX2   LIPASE, BLOOD  LACTIC ACID, PLASMA  MAGNESIUM  PHOSPHORUS  PROTIME-INR  BLOOD GAS, VENOUS  TROPONIN I (HIGH SENSITIVITY)  TROPONIN I (HIGH SENSITIVITY)    EKG None EKG not importing from [facility] has been interpreted.  Sinus rhythm.  No acute ischemic appearance.  Consistent with previous. Radiology DG Chest 2 View  Result Date: 03/26/2021 CLINICAL DATA:  Bilateral extremity swelling with shortness of breath for 2 weeks. History of congestive heart failure. EXAM: CHEST - 2 VIEW COMPARISON:  Radiographs 11/24/2020 and 01/13/2020.  CT 04/02/2020. FINDINGS: The heart size and mediastinal contours are stable. There is a stable calcified left upper lobe granuloma and calcified left hilar lymph nodes. The lungs are otherwise clear. There is blunting of both costophrenic angles posteriorly on the lateral view, consistent with small bilateral pleural effusions. No pneumothorax. The bones appear unchanged with changes of diffuse idiopathic skeletal hyperostosis in the thoracic spine. IMPRESSION: 1. Small bilateral pleural effusions. No acute cardiopulmonary process. 2. Old granulomatous disease. Electronically Signed   By: Richardean Sale M.D.   On: 03/26/2021 13:03    Procedures Procedures  CRITICAL CARE Performed by: Charlesetta Shanks   Total critical care time: 30 minutes  Critical care time was exclusive of separately billable procedures and treating other patients.  Critical care was necessary to treat or prevent imminent or life-threatening deterioration.  Critical care was time spent personally by me on the following activities: development of treatment plan with patient and/or surrogate as well as nursing, discussions with consultants, evaluation of patient's response to treatment, examination of patient, obtaining history from patient or surrogate, ordering and performing treatments and interventions, ordering and review of laboratory studies, ordering and review of radiographic studies,  pulse oximetry and re-evaluation of patient's condition. Medications Ordered in ED Medications  iohexol (OMNIPAQUE) 350 MG/ML injection 100 mL (100 mLs Intravenous Contrast Given 03/26/21 1454)    ED Course  I have reviewed the triage vital signs and the nursing notes.  Pertinent labs & imaging results that were available during my care of the patient were reviewed by me and considered in my medical decision making (see chart for details).    MDM Rules/Calculators/A&P                          Patient presents with increased generalized edema.  He has large edema of the lower extremities and appears to have abdominal distention as well.  Patient has history of DVT and is anticoagulated.  He has had extensive car trip with potential increased risk of DVT and PE.  We will proceed with diagnostic evaluation for CHF, PE, kidney dysfunction.  Clinically, symptoms are most suggestive of CHF.  Anticipate admission for further diagnostic evaluation and diuresis.  CT angio portion does not show any acute obstructions or dissections.  CT PE chest shows small pleural effusions.  My review no central or large PE.  At this time symptoms most suggestive of CHF.  Will initiate Lasix and nitroglycerin paste.  After CT scan, patient started complaining of a lot of back pain and spasms.  He did not have any pain prior to CT scan.  His wife identifies that sometimes if he has to lie in a certain position his  back goes into spasms and this is happened before with imaging studies requiring him to be in awkward positions.  Will add Dilaudid for pain control.  Patient's blood pressure has elevated since returning from CT scan.  I suspect this is pain related.  First we will address pain.  Patient is also getting nitroglycerin paste and Lasix.  Consult: Reviewed Dr. Pietro Cassis for admission Final Clinical Impression(s) / ED Diagnoses Final diagnoses:  Shortness of breath  Generalized edema    Rx / DC Orders ED  Discharge Orders    None       Charlesetta Shanks, MD 03/27/21 [phone]

## 2021-03-26 NOTE — Progress Notes (Signed)
   03/26/21 2200  Provider Notification  Provider Name/Title Harrold Donath, MD  Date Provider Notified 03/26/21  Time Provider Notified 2150  Notification Type Page  Notification Reason Critical result  Test performed and critical result VBG= pO2 31.6  Date Critical Result Received 03/26/21  Time Critical Result Received 2138  Provider response No new orders (Pt' s venous pO2 at  13:00 03/26/21 is 27)

## 2021-03-27 ENCOUNTER — Inpatient Hospital Stay (HOSPITAL_COMMUNITY): Payer: Medicare Other

## 2021-03-27 ENCOUNTER — Encounter (HOSPITAL_COMMUNITY): Payer: Self-pay | Admitting: Internal Medicine

## 2021-03-27 DIAGNOSIS — R0602 Shortness of breath: Secondary | ICD-10-CM

## 2021-03-27 DIAGNOSIS — I2510 Atherosclerotic heart disease of native coronary artery without angina pectoris: Secondary | ICD-10-CM

## 2021-03-27 DIAGNOSIS — I5030 Unspecified diastolic (congestive) heart failure: Secondary | ICD-10-CM | POA: Diagnosis not present

## 2021-03-27 DIAGNOSIS — R609 Edema, unspecified: Secondary | ICD-10-CM | POA: Diagnosis not present

## 2021-03-27 DIAGNOSIS — I509 Heart failure, unspecified: Secondary | ICD-10-CM | POA: Diagnosis not present

## 2021-03-27 HISTORY — DX: Atherosclerotic heart disease of native coronary artery without angina pectoris: I25.10

## 2021-03-27 LAB — ECHOCARDIOGRAM COMPLETE
Area-P 1/2: 2.76 cm2
Calc EF: 76.9 %
Height: 71 in
S' Lateral: 2.4 cm
Single Plane A2C EF: 74.2 %
Single Plane A4C EF: 78.8 %
Weight: 3940.06 oz

## 2021-03-27 LAB — CBC
HCT: 36.2 % — ABNORMAL LOW (ref 39.0–52.0)
Hemoglobin: 11 g/dL — ABNORMAL LOW (ref 13.0–17.0)
MCH: 23.5 pg — ABNORMAL LOW (ref 26.0–34.0)
MCHC: 30.4 g/dL (ref 30.0–36.0)
MCV: 77.2 fL — ABNORMAL LOW (ref 80.0–100.0)
Platelets: 232 10*3/uL (ref 150–400)
RBC: 4.69 MIL/uL (ref 4.22–5.81)
RDW: 17.1 % — ABNORMAL HIGH (ref 11.5–15.5)
WBC: 10.2 10*3/uL (ref 4.0–10.5)
nRBC: 0 % (ref 0.0–0.2)

## 2021-03-27 LAB — BASIC METABOLIC PANEL
Anion gap: 10 (ref 5–15)
BUN: 18 mg/dL (ref 8–23)
CO2: 25 mmol/L (ref 22–32)
Calcium: 8.4 mg/dL — ABNORMAL LOW (ref 8.9–10.3)
Chloride: 106 mmol/L (ref 98–111)
Creatinine, Ser: 1.41 mg/dL — ABNORMAL HIGH (ref 0.61–1.24)
GFR, Estimated: 52 mL/min — ABNORMAL LOW (ref >60)
Glucose, Bld: 109 mg/dL — ABNORMAL HIGH (ref 70–99)
Potassium: 3.8 mmol/L (ref 3.5–5.1)
Sodium: 141 mmol/L (ref 135–145)

## 2021-03-27 LAB — TROPONIN I (HIGH SENSITIVITY): Troponin I (High Sensitivity): 6 ng/L (ref <18)

## 2021-03-27 MED ORDER — CYCLOBENZAPRINE HCL 5 MG PO TABS
5.0000 mg | ORAL_TABLET | Freq: Two times a day (BID) | ORAL | Status: DC | PRN
  Administered 2021-03-27 – 2021-03-28 (×4): 5 mg via ORAL
  Filled 2021-03-27 (×4): qty 1

## 2021-03-27 MED ORDER — FAMOTIDINE 20 MG PO TABS
20.0000 mg | ORAL_TABLET | Freq: Two times a day (BID) | ORAL | Status: DC
  Administered 2021-03-27 – 2021-03-29 (×4): 20 mg via ORAL
  Filled 2021-03-27 (×4): qty 1

## 2021-03-27 NOTE — CV Procedure (Signed)
BLE venous duplex completed.  Results can be found under chart review under CV PROC. 03/27/2021 4:19 PM Lanika Colgate RVT, RDMS

## 2021-03-27 NOTE — Plan of Care (Signed)
  Problem: Education: Goal: Knowledge of General Education information will improve Description: Including pain rating scale, medication(s)/side effects and non-pharmacologic comfort measures 03/27/2021 0956 by Marcella Dubs, RN Outcome: Progressing 03/27/2021 0956 by Marcella Dubs, RN Outcome: Progressing   Problem: Clinical Measurements: Goal: Will remain free from infection Outcome: Progressing Goal: Diagnostic test results will improve Outcome: Progressing   Problem: Nutrition: Goal: Adequate nutrition will be maintained Outcome: Progressing   Problem: Pain Managment: Goal: General experience of comfort will improve Outcome: Progressing   Problem: Safety: Goal: Ability to remain free from injury will improve Outcome: Progressing

## 2021-03-27 NOTE — Progress Notes (Signed)
Occupational Therapy Evaluation  Patient lives with spouse in a two story house with no steps to enter, can live on main level with bed/bath. Patient is mod I at baseline with self care and mobility with intermittent use of adaptive devices. Currently patient demonstrates LB dressing, functional transfers and mobility without any physical assistance. Vital signs stable on room air. Patient and spouse are hopeful he can discharge tomorrow and do not have concerns regarding ADLs once home. No further acute OT needs at this time, will sign off. Please re-consult if new needs arise.    03/27/21 1511  OT Visit Information  Last OT Received On 03/27/21  Assistance Needed +1  History of Present Illness 75 yo male admitted with CHF exac, bil pleural effusions. Hx of PE, L LE DVT, hearing loss, falls, chronic pain, CKD, CAD  Precautions  Precautions Fall  Restrictions  Weight Bearing Restrictions No  Home Living  Family/patient expects to be discharged to: Private residence  Living Arrangements Spouse/significant other  Available Help at Discharge Family  Type of [place] Access Level entry  [place] Two level;Able to live on main level with bedroom/bathroom  Bathroom Shower/Tub Walk-in shower  [place] - 4 wheels;Electric scooter;Wheelchair - manual;Cane - single point;Shower seat;Grab bars - tub/shower  Prior Function  Level of Independence Independent  Comments does not normally use a device but has them to use PRN  Communication  Communication HOH  Pain Assessment  Pain Assessment No/denies pain  Cognition  Arousal/Alertness Awake/alert  Behavior During Therapy WFL for tasks assessed/performed  Overall Cognitive Status Within Functional Limits for tasks assessed  Upper Extremity Assessment  Upper Extremity Assessment Overall WFL for tasks assessed  Lower Extremity Assessment  Lower Extremity Assessment Defer to PT evaluation   Cervical / Trunk Assessment  Cervical / Trunk Assessment Normal  ADL  Overall ADL's  Modified independent  General ADL Comments patient demonstrates LB dressing, functional transfers and ambulation without physical assistance. Patient reports feeling a little below his baseline as he isn't moving around as much as he would at home however he and spouse feel confident he can perform ADLs safely. Educate pt and spouse on gradual increase of activity at home to build back endurance, verbalize understanding.  Transfers  Overall transfer level Modified independent  Equipment used None  General transfer comment increased time in standing to obtain balance before transferring  Balance  Overall balance assessment Mild deficits observed, not formally tested  General Comments  General comments (skin integrity, edema, etc.) VSS on room air  OT - End of Session  Activity Tolerance Patient tolerated treatment well  Patient left in bed;with call bell/phone within reach;with family/visitor present  Nurse Communication Mobility status;Other (comment) (leaking external catheter)  OT Assessment  OT Recommendation/Assessment Patient does not need any further OT services  OT Visit Diagnosis Other abnormalities of gait and mobility (R26.89)  OT Problem List Decreased activity tolerance  AM-PAC OT "6 Clicks" Daily Activity Outcome Measure (Version 2)  Help from another person eating meals? 4  Help from another person taking care of personal grooming? 4  Help from another person toileting, which includes using toliet, bedpan, or urinal? 4  Help from another person bathing (including washing, rinsing, drying)? 4  Help from another person to put on and taking off regular upper body clothing? 4  Help from another person to put on and taking off regular lower body clothing? 4  6 Click Score  24  OT Recommendation  Follow Up Recommendations No OT follow up  OT Equipment None recommended by OT  Acute Rehab OT  Goals  Patient Stated Goal to get better and get home  OT Goal Formulation All assessment and education complete, DC therapy  OT Time Calculation  OT Start Time (ACUTE ONLY) 1434  OT Stop Time (ACUTE ONLY) 1453  OT Time Calculation (min) 19 min  OT General Charges  $OT Visit 1 Visit  OT Evaluation  $OT Eval Low Complexity 1 Low  Written Expression  Dominant Hand Right   Bruce Ball OT OT pager: [phone]

## 2021-03-27 NOTE — Progress Notes (Signed)
  Echocardiogram 2D Echocardiogram has been performed.  Bruce Ball 03/27/2021, 8:35 AM

## 2021-03-27 NOTE — Evaluation (Signed)
Physical Therapy Evaluation Patient Details Name: Bruce Ball MRN: [id number] DOB: Nov 18, 1946 Today's Date: 03/27/2021   History of Present Illness  75 yo male admitted with CHF exac, bil pleural effusions. Hx of PE, L LE DVT, hearing loss, falls, chronic pain, CKD, CAD  Clinical Impression  On eval, pt was Min guard assist for mobility. He is unsteady at times when ambulating. Pt denied dizziness. Moderate back pain. O2 89% on RA with activity. Discussed d/c plan with pt/wife-will return home. Both politely declined HHPT f/u after discharge. Will plan to follow and progress activity as tolerated.     Follow Up Recommendations No PT follow up;Supervision - Intermittent    Equipment Recommendations  None recommended by PT    Recommendations for Other Services       Precautions / Restrictions Precautions Precautions: Fall Restrictions Weight Bearing Restrictions: No      Mobility  Bed Mobility Overal bed mobility: Modified Independent             General bed mobility comments: increased time. relied on bedrail. hob elevated    Transfers Overall transfer level: Needs assistance   Transfers: Sit to/from Stand Sit to Stand: Min guard         General transfer comment: Min guard for safety. Pt denied dizziness.  Ambulation/Gait Ambulation/Gait assistance: Min guard Gait Distance (Feet): 100 Feet Assistive device: None (hallway handrail) Gait Pattern/deviations: Step-through pattern;Decreased stride length     General Gait Details: has a limp but pt denies pain-states this is chronic. mildly unsteady. denies dizziness  Stairs            Wheelchair Mobility    Modified Rankin (Stroke Patients Only)       Balance Overall balance assessment: Mild deficits observed, not formally tested                                           Pertinent Vitals/Pain Pain Assessment: 0-10 Pain Location: upper back Pain Descriptors / Indicators:  Aching;Discomfort    Home Living Family/patient expects to be discharged to:: Private residence Living Arrangements: Spouse/significant other Available Help at Discharge: Family Type of Home: House Home Access: Level entry     Home Layout: Two level;Able to live on main level with bedroom/bathroom Home Equipment: [place] - 4 wheels;Electric scooter;Wheelchair - manual;Cane - single point      Prior Function Level of Independence: Independent         Comments: does not normally use a device but has them to use PRN     Hand Dominance        Extremity/Trunk Assessment   Upper Extremity Assessment Upper Extremity Assessment: Generalized weakness    Lower Extremity Assessment Lower Extremity Assessment: Generalized weakness    Cervical / Trunk Assessment Cervical / Trunk Assessment: Normal  Communication   Communication: HOH  Cognition Arousal/Alertness: Awake/alert Behavior During Therapy: WFL for tasks assessed/performed Overall Cognitive Status: Within Functional Limits for tasks assessed                                        General Comments      Exercises     Assessment/Plan    PT Assessment Patient needs continued PT services  PT Problem List Decreased mobility       PT  Treatment Interventions DME instruction;Gait training;Therapeutic exercise;Balance training;Functional mobility training;Therapeutic activities;Patient/family education    PT Goals (Current goals can be found in the Care Plan section)  Acute Rehab PT Goals Patient Stated Goal: to get better and get home PT Goal Formulation: With patient/family Time For Goal Achievement: 04/10/21 Potential to Achieve Goals: Good    Frequency Min 3X/week   Barriers to discharge        Co-evaluation               AM-PAC PT "6 Clicks" Mobility  Outcome Measure Help needed turning from your back to your side while in a flat bed without using bedrails?: None Help needed  moving from lying on your back to sitting on the side of a flat bed without using bedrails?: None Help needed moving to and from a bed to a chair (including a wheelchair)?: A Little Help needed standing up from a chair using your arms (e.g., wheelchair or bedside chair)?: A Little Help needed to walk in hospital room?: A Little Help needed climbing 3-5 steps with a railing? : A Little 6 Click Score: 20    End of Session Equipment Utilized During Treatment: Gait belt Activity Tolerance: Patient tolerated treatment well Patient left: in chair;with call bell/phone within reach;with family/visitor present   PT Visit Diagnosis: Unsteadiness on feet (R26.81)    Time: 1100-1115 PT Time Calculation (min) (ACUTE ONLY): 15 min   Charges:   PT Evaluation $PT Eval Moderate Complexity: 1 Mod           Doreatha Massed, PT Acute Rehabilitation  Office: [phone] Pager: [phone]

## 2021-03-27 NOTE — Progress Notes (Signed)
PROGRESS NOTE  Bruce HEINLE  DOB: 04-Mar-1946  PCP: [name], MD QMG:[id number]  DOA: 03/26/2021  LOS: 1 day   Chief Complaint  Patient presents with  . Shortness of Breath   Brief narrative: DAEMIAN GAHM is a 75 y.o. male with PMH significant for HTN, CAD, CKD 3 CAD, DDD with chronic back pain, hx of PE in January 2021 on Eliquis, deafness in right ear.   Patient presented to the ED at the [facility] on 3/28 with 2 weeks history of progressive bilateral lower extremity edema and shortness of breath.  He also recently had a 2400 miles road trip.  He noted gradual swelling of her extremities during the trip.  Since then, he has had progressive generalized weakness, orthopnea because of which he has been sleeping in a recliner.   In the ED, patient was afebrile, heart rate in 50s, blood pressure elevated to 180s with one episode up to 231/109, breathing on room air. Initial lab with creatinine elevated 1.37, hemoglobin 11.1, platelet 252  venous blood gas with PCO2 44, PO2 27 Troponin negative, lactic acid level normal. CT angio of chest was negative for pulmonary embolism and had small bilateral pleural effusion, no infiltrates, no consolidation. Patient was given IV Lasix in the ED and accepted for inpatient hospitalization at [facility].  Subjective: Patient was seen and examined this morning.  Pleasant elderly Caucasian male.  Propped up in bed.  Not in distress.  Not on supplemental oxygen.  Wife at bedside. Chart reviewed. Hemodynamically stable, breathing on room air Labs this morning with creatinine slightly worse at 1.41  negative balance of 2.8 L in last 24 hours  Assessment/Plan: Acute exacerbation of CHF  Hypertensive urgency -Present with progressively worsening bilateral pedal edema, orthopnea, dyspnea on exertion and blood pressure elevated up to systolic 326. -Started on IV Lasix 40 mg twice daily with 2.8 L of negative balance last 24 hours. -Home meds include  Coreg 25 mg twice daily and indapamide 1.25 mg daily.  Not on Imdur at home as previously mentioned in the chart. -Continue Coreg at this time.  I would hold indapamide.  Continue IV Lasix -Blood pressure monitoring.  Pending echocardiogram. -Net IO Since Admission: -2,600 mL [03/27/21 1131] -Continue to monitor for daily intake output, weight, blood pressure, BNP, renal function and electrolytes. Recent Labs  Lab 03/26/21 1230 03/26/21 1313 03/27/21 0008  BNP 107.3*  --   --   BUN 17  --  18  CREATININE 1.37*  --  1.41*  K 4.0 4.0 3.8  MG 2.0  --   --    CKD stage III -Creatinine at baseline less than 1.7.  So far at baseline. Recent Labs    04/02/20 1434 04/04/20 1527 07/25/20 1154 01/25/21 1125 03/26/21 1230 03/27/21 0008  BUN 27* 25* 26* 17 17 18   CREATININE 1.92* 1.74* 1.42* 1.67* 1.37* 1.41*   CAD/hyperlipidemia -Continue Eliquis and Lipitor  History of DVT/PE in 2021 -Patient had DVT/PE in 2021.  After 4 years of anticoagulation with Eliquis, patient states Dr. Alvy Bimler switched him to half dose Eliquis 2.5 mg twice daily.  Patient has been taking that.  He recently had a long travel and hence new DVT is still a suspicion.  CT angio of chest negative for new pulmonary embolism. -Obtain DVT scan of both legs. -Continue Eliquis.  Degenerative disc disease Chronic back pain -Not on any pain medicine is in his list of home medicines. -Patient has been hurting now after  he was laid supine for CT scan.  We will try Flexeril twice daily as needed.  Generalized weakness -PT/OT  Mobility: PT/OT eval. Code Status:   Code Status: Full Code  Nutritional status: Body mass index is 34.35 kg/m.     Diet Order            Diet Heart Room service appropriate? Yes; Fluid consistency: Thin  Diet effective now                 DVT prophylaxis: apixaban (ELIQUIS) tablet 2.5 mg Start: 03/26/21 2200 apixaban (ELIQUIS) tablet 2.5 mg   Antimicrobials:  None Fluid:  None Consultants: None Family Communication:  Wife at bedside  Status is: Inpatient  Remains inpatient appropriate because: Needs continuous IV diuresis and monitoring.  Dispo: The patient is from: Home              Anticipated d/c is to: Home              Patient currently is not medically stable to d/c.   Difficult to place patient No       Infusions:  . sodium chloride      Scheduled Meds: . apixaban  2.5 mg Oral BID  . atorvastatin  20 mg Oral QHS  . carvedilol  25 mg Oral BID WC  . furosemide  40 mg Intravenous Q12H  . nitroGLYCERIN  1 inch Topical Q6H  . potassium chloride  20 mEq Oral Daily  . sodium chloride flush  3 mL Intravenous Q12H    Antimicrobials: Anti-infectives (From admission, onward)   None      PRN meds: sodium chloride, acetaminophen, cyclobenzaprine, ondansetron (ZOFRAN) IV, sodium chloride flush   Objective: Vitals:   03/27/21 0331 03/27/21 0719  BP: 128/64 134/60  Pulse: 81 71  Resp: 20 20  Temp: 98.1 F (36.7 C) 98.2 F (36.8 C)  SpO2: 96% 97%    Intake/Output Summary (Last 24 hours) at 03/27/2021 1131 Last data filed at 03/27/2021 0949 Gross per 24 hour  Intake 300 ml  Output 2900 ml  Net -2600 ml   Filed Weights   03/26/21 1218 03/27/21 0500  Weight: 117.9 kg 111.7 kg   Weight change:  Body mass index is 34.35 kg/m.   Physical Exam: General exam: Pleasant elderly Caucasian male.  Not in distress.  Not on supplemental oxygen Skin: No rashes, lesions or ulcers. HEENT: Atraumatic, normocephalic, no obvious bleeding Lungs: Clear to auscultation bilaterally CVS: Regular rate and rhythm, no murmur GI/Abd soft, nontender, nondistended, bowel sound present CNS: Alert, awake, oriented x3 Psychiatry: Mood appropriate Extremities: 1+ bilateral pedal edema.  Mild tenderness in the right calf  Data Review: I have personally reviewed the laboratory data and studies available.  Recent Labs  Lab 03/26/21 1230 03/26/21 1235  03/26/21 1313 03/27/21 0008  WBC 8.4 8.5  --  10.2  NEUTROABS 6.2  --   --   --   HGB 11.1* 11.1* 10.9* 11.0*  HCT 36.5* 36.4* 32.0* 36.2*  MCV 77.0* 77.3*  --  77.2*  PLT 252 242  --  232   Recent Labs  Lab 03/26/21 1230 03/26/21 1313 03/27/21 0008  NA 144 143 141  K 4.0 4.0 3.8  CL 110  --  106  CO2 25  --  25  GLUCOSE 86  --  109*  BUN 17  --  18  CREATININE 1.37*  --  1.41*  CALCIUM 8.1*  --  8.4*  MG 2.0  --   --  PHOS 2.5  --   --     F/u labs ordered Unresulted Labs (From admission, onward)          Start     Ordered   03/28/21 0500  CBC with Differential/Platelet  Daily,   R     Question:  Specimen collection method  Answer:  Lab=Lab collect   03/27/21 1131   03/27/21 0623  Basic metabolic panel  Daily,   R     Question:  Specimen collection method  Answer:  Lab=Lab collect   03/26/21 2051          Signed, Terrilee Croak, MD [facility] 03/27/2021

## 2021-03-28 ENCOUNTER — Encounter (HOSPITAL_COMMUNITY): Payer: Self-pay | Admitting: Internal Medicine

## 2021-03-28 ENCOUNTER — Telehealth: Payer: Self-pay

## 2021-03-28 DIAGNOSIS — I5033 Acute on chronic diastolic (congestive) heart failure: Secondary | ICD-10-CM

## 2021-03-28 DIAGNOSIS — I10 Essential (primary) hypertension: Secondary | ICD-10-CM | POA: Diagnosis not present

## 2021-03-28 DIAGNOSIS — R0602 Shortness of breath: Secondary | ICD-10-CM

## 2021-03-28 DIAGNOSIS — N179 Acute kidney failure, unspecified: Secondary | ICD-10-CM

## 2021-03-28 DIAGNOSIS — I130 Hypertensive heart and chronic kidney disease with heart failure and stage 1 through stage 4 chronic kidney disease, or unspecified chronic kidney disease: Principal | ICD-10-CM

## 2021-03-28 DIAGNOSIS — I5031 Acute diastolic (congestive) heart failure: Secondary | ICD-10-CM

## 2021-03-28 DIAGNOSIS — R609 Edema, unspecified: Secondary | ICD-10-CM

## 2021-03-28 DIAGNOSIS — N1831 Chronic kidney disease, stage 3a: Secondary | ICD-10-CM | POA: Diagnosis not present

## 2021-03-28 LAB — CBC WITH DIFFERENTIAL/PLATELET
Abs Immature Granulocytes: 0.02 10*3/uL (ref 0.00–0.07)
Basophils Absolute: 0.1 10*3/uL (ref 0.0–0.1)
Basophils Relative: 1 %
Eosinophils Absolute: 0.2 10*3/uL (ref 0.0–0.5)
Eosinophils Relative: 2 %
HCT: 36 % — ABNORMAL LOW (ref 39.0–52.0)
Hemoglobin: 11 g/dL — ABNORMAL LOW (ref 13.0–17.0)
Immature Granulocytes: 0 %
Lymphocytes Relative: 19 %
Lymphs Abs: 1.8 10*3/uL (ref 0.7–4.0)
MCH: 23.9 pg — ABNORMAL LOW (ref 26.0–34.0)
MCHC: 30.6 g/dL (ref 30.0–36.0)
MCV: 78.3 fL — ABNORMAL LOW (ref 80.0–100.0)
Monocytes Absolute: 1.3 10*3/uL — ABNORMAL HIGH (ref 0.1–1.0)
Monocytes Relative: 13 %
Neutro Abs: 6.1 10*3/uL (ref 1.7–7.7)
Neutrophils Relative %: 65 %
Platelets: 223 10*3/uL (ref 150–400)
RBC: 4.6 MIL/uL (ref 4.22–5.81)
RDW: 16.9 % — ABNORMAL HIGH (ref 11.5–15.5)
WBC: 9.5 10*3/uL (ref 4.0–10.5)
nRBC: 0 % (ref 0.0–0.2)

## 2021-03-28 LAB — BASIC METABOLIC PANEL
Anion gap: 11 (ref 5–15)
BUN: 19 mg/dL (ref 8–23)
CO2: 28 mmol/L (ref 22–32)
Calcium: 8.4 mg/dL — ABNORMAL LOW (ref 8.9–10.3)
Chloride: 99 mmol/L (ref 98–111)
Creatinine, Ser: 1.87 mg/dL — ABNORMAL HIGH (ref 0.61–1.24)
GFR, Estimated: 37 mL/min — ABNORMAL LOW (ref >60)
Glucose, Bld: 100 mg/dL — ABNORMAL HIGH (ref 70–99)
Potassium: 3.2 mmol/L — ABNORMAL LOW (ref 3.5–5.1)
Sodium: 138 mmol/L (ref 135–145)

## 2021-03-28 NOTE — Telephone Encounter (Signed)
Orders placed for calcium score. Will send message to schedulers to call patient to set up.

## 2021-03-28 NOTE — Progress Notes (Addendum)
PROGRESS NOTE   Bruce Ball  SHF:[id number]    DOB: 03-18-1946    DOA: 03/26/2021  PCP: [name], MD   I have briefly reviewed patients previous medical records in [facility].  Chief Complaint  Patient presents with  . Shortness of Breath    Brief Narrative:  75 year old married male with medical history significant for but not limited to HTN, CAD, CKD 3a, DDD with chronic back pain, PE in January 2021 on Eliquis, deaf in right ear, presented to the ED on 3/28 with 2 weeks history of progressive bilateral lower extremity edema, abdominal distention and dyspnea.  He recently had a 2400 mile road trip and noted gradual swelling of his extremities during the trip.  He also reported progressive weakness, orthopnea because of which she had been sleeping in a recliner.  Admitted for acute diastolic CHF and hypertensive urgency.  Cardiology consulted.   Assessment & Plan:  Principal Problem:   CHF exacerbation ([place]) Active Problems:   Essential hypertension   Peripheral edema   DDD (degenerative disc disease), thoracic   Chronic kidney disease (CKD), stage III (moderate) (HCC)   Shortness of breath   Chronic anticoagulation   Acute diastolic CHF Likely precipitated by poorly controlled hypertension/hypertensive urgency.  Also contributed by high salt diet.  Diuresed with IV Lasix and -4.3 L thus far.  Approximately 15 pounds weight down since admission.  Volume status is significantly improved, creatinine trending up and hence IV Lasix discontinued.  Patient/family requested cardiology consult.  Cardiology input appreciated.  Recommend not restarting Lasix, already on diuretic/indapamide for kidney stones to continue.  Recommend low-sodium diet and adequate blood pressure control.  CTA chest negative for PE.  Can get outpatient coronary calcium score.  Cardiology signed off.  Continue carvedilol.  No need to resume Imdur.  Acute on stage IIIa chronic kidney disease Does  not follow with nephrology.  Chronic kidney disease likely due to chronically poorly controlled hypertension.  AKI due to aggressive diuresis.  Baseline creatinine maybe in the 1.4.  Creatinine peaked to 1.8.  IV Lasix discontinued.  Follow BMP in a.m.  Recommend outpatient nephrology consultation.  Hypertension/hypertensive urgency Improved control.  Continue carvedilol.  Not on Imdur PTA.  Low-salt diet recommended.  Anemia in CKD Stable.  However has microcytic anemia which will need to be evaluated as outpatient  Hypokalemia Replace and follow  Hyperlipidemia  Continue atorvastatin  CAD No anginal symptoms.  Troponins negative.  Per cardiology, may get outpatient calcium scores.  History of DVT/PE in 2021 Per prior notes, following 4 years of anticoagulation with Eliquis, patient stated that Dr. Alvy Bimler switched him to half dose Eliquis 2.5 mg twice daily.  CTA chest negative for new PE.  Bilateral lower extremity venous Dopplers without DVT or SVT.  Continue Eliquis.  Degenerative disc disease/chronic back pain Reportedly started having back pain and spasms following lying supine for CT.  Supportive care.  As needed Flexeril.  Body mass index is 34.1 kg/m.    DVT prophylaxis: apixaban (ELIQUIS) tablet 2.5 mg Start: 03/26/21 2200     Code Status: Full Code Family Communication: Discussed in detail with spouse at bedside.  Updated care and answered questions. Disposition:  Status is: Inpatient  Remains inpatient appropriate because:Inpatient level of care appropriate due to severity of illness   Dispo: The patient is from: Home              Anticipated d/c is to: Home likely 3/31.  Patient currently is not medically stable to d/c.   Difficult to place patient No        Consultants:   Cardiology  Procedures:   None  Antimicrobials:    Anti-infectives (From admission, onward)   None        Subjective:  Seen this morning.  Spouse at bedside.   Feeling better.  Leg swelling and abdominal swelling have significantly improved.  No dyspnea or chest pain.  Intermittent back spasms.  Both indicated that they wish to see Cardiology in the hospital/actually requested Dr. Haroldine Laws  Objective:   Vitals:   03/27/21 1227 03/27/21 2045 03/28/21 0500 03/28/21 1450  BP: (!) 120/59 131/66  (!) 163/75  Pulse: 75 78  79  Resp: 20 20  (!) 22  Temp: 98.8 F (37.1 C) 98.8 F (37.1 C)  99.4 F (37.4 C)  TempSrc: Oral   Oral  SpO2: 99% 96%  95%  Weight:   110.9 kg   Height:        General exam: Elderly male, moderately built and obese lying comfortably propped up in bed without distress. Respiratory system: Clear to auscultation. Respiratory effort normal. Cardiovascular system: S1 & S2 heard, RRR. No JVD, murmurs, rubs, gallops or clicks.  Trace ankle edema.  Telemetry personally reviewed: Sinus rhythm. Gastrointestinal system: Abdomen is nondistended, soft and nontender. No organomegaly or masses felt. Normal bowel sounds heard. Central nervous system: Alert and oriented. No focal neurological deficits. Extremities: Symmetric 5 x 5 power. Skin: No rashes, lesions or ulcers Psychiatry: Judgement and insight appear normal. Mood & affect appropriate.     Data Reviewed:   I have personally reviewed following labs and imaging studies   CBC: Recent Labs  Lab 03/26/21 1230 03/26/21 1235 03/26/21 1313 03/27/21 0008 03/28/21 0529  WBC 8.4 8.5  --  10.2 9.5  NEUTROABS 6.2  --   --   --  6.1  HGB 11.1* 11.1* 10.9* 11.0* 11.0*  HCT 36.5* 36.4* 32.0* 36.2* 36.0*  MCV 77.0* 77.3*  --  77.2* 78.3*  PLT 252 242  --  232 324    Basic Metabolic Panel: Recent Labs  Lab 03/26/21 1230 03/26/21 1313 03/27/21 0008 03/28/21 0529  NA 144 143 141 138  K 4.0 4.0 3.8 3.2*  CL 110  --  106 99  CO2 25  --  25 28  GLUCOSE 86  --  109* 100*  BUN 17  --  18 19  CREATININE 1.37*  --  1.41* 1.87*  CALCIUM 8.1*  --  8.4* 8.4*  MG 2.0  --   --    --   PHOS 2.5  --   --   --     Liver Function Tests: Recent Labs  Lab 03/26/21 1230  AST 21  ALT 16  ALKPHOS 51  BILITOT 0.7  PROT 5.7*  ALBUMIN 3.5    CBG: No results for input(s): GLUCAP in the last 168 hours.  Microbiology Studies:   Recent Results (from the past 240 hour(s))  Resp Panel by RT-PCR (Flu A&B, Covid) Nasopharyngeal Swab     Status: None   Collection Time: 03/26/21  1:40 PM   Specimen: Nasopharyngeal Swab; Nasopharyngeal(NP) swabs in vial transport medium  Result Value Ref Range Status   SARS Coronavirus 2 by RT PCR NEGATIVE NEGATIVE Final    Comment: (NOTE) SARS-CoV-2 target nucleic acids are NOT DETECTED.  The SARS-CoV-2 RNA is generally detectable in upper respiratory specimens during the acute phase of  infection. The lowest concentration of SARS-CoV-2 viral copies this assay can detect is 138 copies/mL. A negative result does not preclude SARS-Cov-2 infection and should not be used as the sole basis for treatment or other patient management decisions. A negative result may occur with  improper specimen collection/handling, submission of specimen other than nasopharyngeal swab, presence of viral mutation(s) within the areas targeted by this assay, and inadequate number of viral copies(<138 copies/mL). A negative result must be combined with clinical observations, patient history, and epidemiological information. The expected result is Negative.  Fact Sheet for Patients:  EntrepreneurPulse.com.au  Fact Sheet for Healthcare Providers:  IncredibleEmployment.be  This test is no t yet approved or cleared by the [place] FDA and  has been authorized for detection and/or diagnosis of SARS-CoV-2 by FDA under an Emergency Use Authorization (EUA). This EUA will remain  in effect (meaning this test can be used) for the duration of the COVID-19 declaration under Section 564(b)(1) of the Act, 21 U.S.C.section  360bbb-3(b)(1), unless the authorization is terminated  or revoked sooner.       Influenza A by PCR NEGATIVE NEGATIVE Final   Influenza B by PCR NEGATIVE NEGATIVE Final    Comment: (NOTE) The Xpert Xpress SARS-CoV-2/FLU/RSV plus assay is intended as an aid in the diagnosis of influenza from Nasopharyngeal swab specimens and should not be used as a sole basis for treatment. Nasal washings and aspirates are unacceptable for Xpert Xpress SARS-CoV-2/FLU/RSV testing.  Fact Sheet for Patients: EntrepreneurPulse.com.au  Fact Sheet for Healthcare Providers: IncredibleEmployment.be  This test is not yet approved or cleared by the [place] FDA and has been authorized for detection and/or diagnosis of SARS-CoV-2 by FDA under an Emergency Use Authorization (EUA). This EUA will remain in effect (meaning this test can be used) for the duration of the COVID-19 declaration under Section 564(b)(1) of the Act, 21 U.S.C. section 360bbb-3(b)(1), unless the authorization is terminated or revoked.  Performed at [facility]      Radiology Studies:  ECHOCARDIOGRAM COMPLETE  Result Date: 03/27/2021    ECHOCARDIOGRAM REPORT   Patient Name:   Bruce Ball Date of Exam: 03/27/2021 Medical Rec #:  [id number]       Height:       71.0 in Accession #:    [id number]      Weight:       246.3 lb Date of Birth:  Jan 02, 1946       BSA:          2.303 m Patient Age:    38 years        BP:           134/60 mmHg Patient Gender: M               HR:           75 bpm. Exam Location:  Inpatient Procedure: 2D Echo, 3D Echo, Cardiac Doppler and Color Doppler Indications:    I50.40* Unspecified combined systolic (congestive) and diastolic                 (congestive) heart failure  History:        Patient has prior history of Echocardiogram examinations, most                 recent 01/14/2020. CHF, Signs/Symptoms:Shortness of Breath and                 Dyspnea; Risk  Factors:Dyslipidemia and Hypertension.  Sonographer:  Roseanna Rainbow RDCS Referring Phys: [id number] [facility]  Sonographer Comments: Technically difficult study due to poor echo windows. Image acquisition challenging due to patient body habitus. IMPRESSIONS  1. Intracavitary gradient. Peak velocity 2.07 m/s. Peak gradient 17 mmHg. Left ventricular ejection fraction, by estimation, is >75%. Left ventricular ejection fraction by 2D MOD biplane is 76.9 %. Left ventricular ejection fraction by PLAX is 76 %. The  left ventricle has hyperdynamic function. The left ventricle has no regional wall motion abnormalities. There is moderate concentric left ventricular hypertrophy. Left ventricular diastolic parameters are consistent with Grade I diastolic dysfunction (impaired relaxation).  2. Right ventricular systolic function is normal. The right ventricular size is normal. There is normal pulmonary artery systolic pressure.  3. The mitral valve is normal in structure. Trivial mitral valve regurgitation. No evidence of mitral stenosis.  4. The aortic valve is calcified. There is mild calcification of the aortic valve. There is mild thickening of the aortic valve. Aortic valve regurgitation is not visualized. Mild aortic valve sclerosis is present, with no evidence of aortic valve stenosis.  5. Aortic dilatation noted. There is mild dilatation of the ascending aorta, measuring 39 mm.  6. The inferior vena cava is normal in size with greater than 50% respiratory variability, suggesting right atrial pressure of 3 mmHg. FINDINGS  Left Ventricle: Intracavitary gradient. Peak velocity 2.07 m/s. Peak gradient 17 mmHg. Left ventricular ejection fraction, by estimation, is >75%. Left ventricular ejection fraction by PLAX is 76 %. Left ventricular ejection fraction by 2D MOD biplane is 76.9 %. The left ventricle has hyperdynamic function. The left ventricle has no regional wall motion abnormalities. The left ventricular internal  cavity size was normal in size. There is moderate concentric left ventricular hypertrophy. Left ventricular diastolic parameters are consistent with Grade I diastolic dysfunction (impaired relaxation). Normal left ventricular filling pressure. Right Ventricle: The right ventricular size is normal. No increase in right ventricular wall thickness. Right ventricular systolic function is normal. There is normal pulmonary artery systolic pressure. The tricuspid regurgitant velocity is 2.10 m/s, and  with an assumed right atrial pressure of 3 mmHg, the estimated right ventricular systolic pressure is 45.4 mmHg. Left Atrium: Left atrial size was normal in size. Right Atrium: Right atrial size was normal in size. Pericardium: Trivial pericardial effusion is present. Mitral Valve: The mitral valve is normal in structure. Trivial mitral valve regurgitation. No evidence of mitral valve stenosis. Tricuspid Valve: The tricuspid valve is normal in structure. Tricuspid valve regurgitation is trivial. No evidence of tricuspid stenosis. Aortic Valve: The aortic valve is calcified. There is mild calcification of the aortic valve. There is mild thickening of the aortic valve. Aortic valve regurgitation is not visualized. Mild aortic valve sclerosis is present, with no evidence of aortic valve stenosis. Pulmonic Valve: The pulmonic valve was normal in structure. Pulmonic valve regurgitation is not visualized. No evidence of pulmonic stenosis. Aorta: Aortic dilatation noted. There is mild dilatation of the ascending aorta, measuring 39 mm. Venous: The inferior vena cava is normal in size with greater than 50% respiratory variability, suggesting right atrial pressure of 3 mmHg. IAS/Shunts: No atrial level shunt detected by color flow Doppler.  LEFT VENTRICLE PLAX 2D                        Biplane EF (MOD) LV EF:         Left            LV Biplane EF:  Left                ventricular                      ventricular                 ejection                         ejection                fraction by                      fraction by                PLAX is 76                       2D MOD                %.                               biplane is LVIDd:         4.30 cm                          76.9 %. LVIDs:         2.40 cm LV PW:         1.40 cm         Diastology LV IVS:        1.40 cm         LV e' medial:    7.18 cm/s LVOT diam:     2.00 cm         LV E/e' medial:  9.1 LV SV:         58              LV e' lateral:   5.44 cm/s LV SV Index:   25              LV E/e' lateral: 12.0 LVOT Area:     3.14 cm  LV Volumes (MOD) LV vol d, MOD    57.7 ml A2C: LV vol d, MOD    36.6 ml A4C: LV vol s, MOD    14.9 ml A2C: LV vol s, MOD    7.8 ml A4C: LV SV MOD A2C:   42.8 ml LV SV MOD A4C:   36.6 ml LV SV MOD BP:    38.0 ml RIGHT VENTRICLE             IVC RV S prime:     18.60 cm/s  IVC diam: 2.10 cm TAPSE (M-mode): 2.0 cm LEFT ATRIUM             Index       RIGHT ATRIUM           Index LA diam:        2.90 cm 1.26 cm/m  RA Area:     10.80 cm LA Vol (A2C):   25.1 ml 10.90 ml/m RA Volume:   21.30 ml  9.25 ml/m LA Vol (A4C):   45.2 ml 19.62 ml/m LA Biplane Vol: 35.7 ml 15.50 ml/m  AORTIC VALVE LVOT Vmax:   103.00 cm/s LVOT Vmean:  71.200 cm/s LVOT VTI:  0.184 m  AORTA Ao Root diam: 3.60 cm Ao Asc diam:  3.90 cm MITRAL VALVE               TRICUSPID VALVE MV Area (PHT): 2.76 cm    TR Peak grad:   17.6 mmHg MV Decel Time: 275 msec    TR Vmax:        210.00 cm/s MV E velocity: 65.50 cm/s MV A velocity: 88.10 cm/s  SHUNTS MV E/A ratio:  0.74        Systemic VTI:  0.18 m                            Systemic Diam: 2.00 cm Skeet Latch MD Electronically signed by Skeet Latch MD Signature Date/Time: 03/27/2021/10:45:30 AM    Final    VAS [name] LOWER EXTREMITY VENOUS (DVT)  Result Date: 03/27/2021  Lower Venous DVT Study Indications: SOB and Edema (CHF exacerbation & CKD3).  Risk Factors: HX of PE - 2021. Limitations: Poor ultrasound/tissue interface  and body habitus. Comparison Study: Previous exam 10/16/20 - Negative Performing Technologist: Rogelia Rohrer  Examination Guidelines: A complete evaluation includes B-mode imaging, spectral Doppler, color Doppler, and power Doppler as needed of all accessible portions of each vessel. Bilateral testing is considered an integral part of a complete examination. Limited examinations for reoccurring indications may be performed as noted. The reflux portion of the exam is performed with the patient in reverse Trendelenburg.  +---------+---------------+---------+-----------+----------+-------------------+ RIGHT    CompressibilityPhasicitySpontaneityPropertiesThrombus Aging      +---------+---------------+---------+-----------+----------+-------------------+ CFV      Full           Yes      Yes                                      +---------+---------------+---------+-----------+----------+-------------------+ SFJ      Full                                                             +---------+---------------+---------+-----------+----------+-------------------+ FV Prox  Full           Yes      Yes                                      +---------+---------------+---------+-----------+----------+-------------------+ FV Mid   Full           Yes      Yes                                      +---------+---------------+---------+-----------+----------+-------------------+ FV DistalFull           Yes      Yes                                      +---------+---------------+---------+-----------+----------+-------------------+ PFV      Full                                                             +---------+---------------+---------+-----------+----------+-------------------+  POP      Full           Yes      Yes                                      +---------+---------------+---------+-----------+----------+-------------------+ PTV      Full                                          Not well visualized +---------+---------------+---------+-----------+----------+-------------------+ PERO     Full                                         Not well visualized +---------+---------------+---------+-----------+----------+-------------------+   +---------+---------------+---------+-----------+----------+------------------+ LEFT     CompressibilityPhasicitySpontaneityPropertiesThrombus Aging     +---------+---------------+---------+-----------+----------+------------------+ CFV      Full           Yes      Yes                                     +---------+---------------+---------+-----------+----------+------------------+ SFJ      Full                                                            +---------+---------------+---------+-----------+----------+------------------+ FV Prox  Full           Yes      Yes                                     +---------+---------------+---------+-----------+----------+------------------+ FV Mid   Full           Yes      Yes                                     +---------+---------------+---------+-----------+----------+------------------+ FV DistalFull           Yes      Yes                                     +---------+---------------+---------+-----------+----------+------------------+ PFV      Full                                                            +---------+---------------+---------+-----------+----------+------------------+ POP      Full           Yes      Yes                                     +---------+---------------+---------+-----------+----------+------------------+  PTV      Full                                                            +---------+---------------+---------+-----------+----------+------------------+ PERO                                                  Not seen on this                                                         exam                +---------+---------------+---------+-----------+----------+------------------+   Left Technical Findings: Not visualized segments include Peroneal veins.   Summary: BILATERAL: - No evidence of deep vein thrombosis seen in the lower extremities, bilaterally. - No evidence of superficial venous thrombosis in the lower extremities, bilaterally. -No evidence of popliteal cyst, bilaterally.   *See table(s) above for measurements and observations. Electronically signed by Curt Jews MD on 03/27/2021 at 9:44:26 PM.    Final      Scheduled Meds:   . apixaban  2.5 mg Oral BID  . atorvastatin  20 mg Oral QHS  . carvedilol  25 mg Oral BID WC  . famotidine  20 mg Oral BID  . nitroGLYCERIN  1 inch Topical Q6H  . potassium chloride  20 mEq Oral Daily  . sodium chloride flush  3 mL Intravenous Q12H    Continuous Infusions:   . sodium chloride       LOS: 2 days     Vernell Leep, MD, [place], [facility]. [facility]    To contact the attending provider between 7A-7P or the covering provider during after hours 7P-7A, please log into the web site [email or web address] and access using universal [place] password for that web site. If you do not have the password, please call the hospital operator.  03/28/2021, 7:29 PM

## 2021-03-28 NOTE — Telephone Encounter (Signed)
-----   Message from Isaiah Serge, NP sent at 03/28/2021  2:37 PM EDT ----- Can you arrange for Mr Demeo to have CT of chest for ca+ score?  Next week should be fine or the week after. This is one for $99

## 2021-03-28 NOTE — Consult Note (Addendum)
Cardiology Consultation:   Patient ID: Bruce Ball MRN: [id number]; DOB: [birth year]  Admit date: 03/26/2021 Date of Consult: 03/28/2021  PCP:  [name], MD   [facility]  Cardiologist:  No primary care provider on file. [place], tiffany/Dr. Maylon Peppers Advanced Practice Provider:  No care team member to display Electrophysiologist:  None        Patient Profile:   Bruce Ball is a 75 y.o. male with a hx of neuropathy, HTN, hx PE, anticoag with eliquis, hx of lumbar fusion,  No known CAD, no angina in past who is being seen today for the evaluation of CHF at the request of Dr. Algis Liming.  History of Present Illness:   Bruce Ball with no prior CAD and no hx of angina per cardiology notes of 05/2020, and hx of normal EKG in 2021 presented to [facility] ER on the 28th with SOB and lower ext edema.  Pt does have hx of PE and on eliquis.   He has DDD, HTN, CKD3.  Over last 2 weeks after a road trip driving through with [name]. Over 2400 mile.  He had mild edema with trip and it increased since getting home.  He has been weak and had to use walker which is not his normal.  No chest pain.  SOB increases with exertion. Improves with rest.  No fever, bleeding.  He is on eliquis and not missed any doses.     Pt's wife requested cardiology consult.  She asked for Dr. Haroldine Laws.    Pt and wife stated they did eat a lot of chinese food and fast food on trip.    In ER BNP 107, no edema on CXR, no PE on CT of chest.  It is noted to have coronary artery calcifications.  + bilateral pl effusions.    Hs troponin 7; 7,5,6 Na 138, K+ 3.2 CL 99, CO@ 28, glucose 100, BUN 19 Cr today 1.87 elevated and up from 1.37 on admit , lasix held. Hgb 11 plts 223 WBC 9.5   Echo with EF >75%, LV with hyperdynamic function. No RWMA, moderate concentric LVH, G1DD,  Normal pulmonary artery systolic pressure.  Trivial MR, There is mild dilatation of the ascending  aorta, measuring 39  mm.   And in 12/2019 EF 60-65%, moderately increased LVH, moderate asymmetric hypertrophy of basal septum measuring 15 mm, post wall 11 mm  No DVT on venous dopplers EKG:  The EKG was personally reviewed and demonstrates:  SR at 62 no ST changes, stable EKG Telemetry:  Telemetry was personally reviewed and demonstrates:  SR   Has been on lasix 40 mg IV BID until stopping today and is now neg 3250 ml and wt down from 117.9 kg (259.38 lbs) to 110.9 kg (243.98 lbs).   BP  131/66 now though on admit 188/83.  Currently pt with resoled edema, no chest pain and + back spasms.  His wife believes he had a stress test with chemicals but this was at [place].   Past Medical History:  Diagnosis Date  . Chronic back pain greater than 3 months duration   . Chronic leg pain   . Coronary artery calcification seen on CAT scan 03/27/2021  . Deafness in right ear   . Dyspnea    cause by PE  . Extrinsic asthma, unspecified 04/07/2009   PT DENIES  . GERD (gastroesophageal reflux disease)   . History of kidney stones   . HYPERTENSION 10/29/2007  .  HYPOTENSION 08/13/2010  . LOW BACK PAIN 04/21/2008  . NEPHROLITHIASIS, HX OF 04/21/2008  . Pneumonia   . PUD, HX OF 04/21/2008  . Pulmonary emboli ([place]) 2021   noted on CT 12-2019  . Renal disorder     Past Surgical History:  Procedure Laterality Date  . BACK SURGERY    . bone cancer  1970   rt femur removed and steel rod placed  . CYSTOSCOPY WITH RETROGRADE PYELOGRAM, URETEROSCOPY AND STENT PLACEMENT Right 11/06/2017   Procedure: CYSTOSCOPY WITH  STENT PLACEMENT RIGHT;  Surgeon: Raynelle Bring, MD;  Location: [facility];  Service: Urology;  Laterality: Right;  . CYSTOSCOPY WITH RETROGRADE PYELOGRAM, URETEROSCOPY AND STENT PLACEMENT Left 06/09/2019   Procedure: CYSTOSCOPY WITH RETROGRADE PYELOGRAM, URETEROSCOPY AND STENT PLACEMENT X2;  Surgeon: [name], MD;  Location: [facility];  Service: Urology;  Laterality: Left;  . CYSTOSCOPY WITH RETROGRADE PYELOGRAM,  URETEROSCOPY AND STENT PLACEMENT Bilateral 03/01/2020   Procedure: CYSTOSCOPY WITH RETROGRADE PYELOGRAM, URETEROSCOPY AND STENT PLACEMENT;  Surgeon: [name], MD;  Location: [facility];  Service: Urology;  Laterality: Bilateral;  75 MINS  . CYSTOSCOPY/RETROGRADE/URETEROSCOPY     1 year ago at [place] in [place]  . CYSTOSCOPY/URETEROSCOPY/HOLMIUM LASER Right 11/24/2017   Procedure: CYSTOSCOPY/URETEROSCOPY/ RETROGRADE/STENT REMOVAL;  Surgeon: Raynelle Bring, MD;  Location: [facility];  Service: Urology;  Laterality: Right;  . HOLMIUM LASER APPLICATION Bilateral 12/31/8784   Procedure: HOLMIUM LASER APPLICATION;  Surgeon: [name], MD;  Location: [facility];  Service: Urology;  Laterality: Bilateral;  . prosatectomy       Home Medications:  Prior to Admission medications   Medication Sig Start Date End Date Taking? Authorizing Provider  apixaban (ELIQUIS) 2.5 MG TABS tablet Take 1 tablet (2.5 mg total) by mouth 2 (two) times daily. 01/25/21  Yes Gorsuch, Ni, MD  atorvastatin (LIPITOR) 20 MG tablet Take 20 mg at bedtime by mouth.    Yes [provider]  carvedilol (COREG) 25 MG tablet Take 25 mg by mouth 2 (two) times daily.   Yes [provider]  Cholecalciferol (VITAMIN D3) 50 MCG (2000 UT) capsule Take 2,000 Units by mouth in the morning and at bedtime.    Yes [provider]  isosorbide mononitrate (IMDUR) 30 MG 24 hr tablet Take 30 mg by mouth daily.    Yes [provider]  omeprazole (PRILOSEC) 20 MG capsule Take 40 mg by mouth 2 (two) times daily.   Yes [provider]  predniSONE (DELTASONE) 5 MG tablet Take 5 mg by mouth daily with breakfast.   Yes [provider]  indapamide (LOZOL) 1.25 MG tablet Take 1.25 mg by mouth daily. 03/09/20   [provider]  metoCLOPramide (REGLAN) 5 MG tablet TAKE 1 TABLET (5 MG TOTAL) BY MOUTH 3 (THREE) TIMES DAILY BEFORE MEALS. 04/24/20   Armbruster, Carlota Raspberry, MD    Inpatient  Medications: Scheduled Meds: . apixaban  2.5 mg Oral BID  . atorvastatin  20 mg Oral QHS  . carvedilol  25 mg Oral BID WC  . famotidine  20 mg Oral BID  . nitroGLYCERIN  1 inch Topical Q6H  . potassium chloride  20 mEq Oral Daily  . sodium chloride flush  3 mL Intravenous Q12H   Continuous Infusions: . sodium chloride     PRN Meds: sodium chloride, acetaminophen, cyclobenzaprine, ondansetron (ZOFRAN) IV, sodium chloride flush  Allergies:   No Known Allergies  Social History:   Social History   Socioeconomic History  . Marital status: Married  Spouse name: Not on file  . Number of children: 2  . Years of education: Not on file  . Highest education level: Not on file  Occupational History  . Occupation: Optometrist  Tobacco Use  . Smoking status: Former Smoker    Packs/day: 1.00    Years: 17.00    Pack years: 17.00    Start date: 1963    Quit date: 12/30/1978    Years since quitting: 42.2  . Smokeless tobacco: Never Used  Vaping Use  . Vaping Use: Never used  Substance and Sexual Activity  . Alcohol use: No  . Drug use: No  . Sexual activity: Not on file  Other Topics Concern  . Not on file  Social History Narrative  . Not on file   Social Determinants of Health   Financial Resource Strain: Not on file  Food Insecurity: Not on file  Transportation Needs: Not on file  Physical Activity: Not on file  Stress: Not on file  Social Connections: Not on file  Intimate Partner Violence: Not on file    Family History:    Family History  Problem Relation Age of Onset  . COPD Father   . Cancer Other        prostate  . Prostate cancer Brother   . Esophageal cancer Neg Hx   . Stomach cancer Neg Hx   . Pancreatic cancer Neg Hx   . Colon cancer Neg Hx      ROS:  Please see the history of present illness.  General:no colds or fevers, no weight changes Skin:no rashes or ulcers HEENT:no blurred vision, no congestion CV:see HPI PUL:see HPI GI:no diarrhea  constipation or melena, no indigestion GU:no hematuria, no dysuria MS:no joint pain, no claudication, chronic back pain Neuro:no syncope, no lightheadedness Endo:no diabetes, no thyroid disease  All other ROS reviewed and negative.     Physical Exam/Data:   Vitals:   03/27/21 0719 03/27/21 1227 03/27/21 2045 03/28/21 0500  BP: 134/60 (!) 120/59 131/66   Pulse: 71 75 78   Resp: 20 20 20    Temp: 98.2 F (36.8 C) 98.8 F (37.1 C) 98.8 F (37.1 C)   TempSrc: Oral Oral    SpO2: 97% 99% 96%   Weight:    110.9 kg  Height:        Intake/Output Summary (Last 24 hours) at 03/28/2021 1058 Last data filed at 03/27/2021 1700 Gross per 24 hour  Intake --  Output 650 ml  Net -650 ml   Last 3 Weights 03/28/2021 03/27/2021 03/26/2021  Weight (lbs) 244 lb 7.8 oz 246 lb 4.1 oz 260 lb  Weight (kg) 110.9 kg 111.7 kg 117.935 kg     Body mass index is 34.1 kg/m.  General:  Well nourished, well developed, in no acute distress HEENT: normal  Though deaf in Rt ear Lymph: no adenopathy Neck: no JVD Endocrine:  No thryomegaly Vascular: No carotid bruits; pedal pulses 1+ bilaterally  Cardiac:  normal S1, S2; RRR; no murmur gallup rub or click Lungs:  clear to auscultation bilaterally, no wheezing, rhonchi or rales  Abd: soft, nontender, no hepatomegaly  Ext: no pitting edema, some puffy ankles Musculoskeletal:  No deformities, BUE and BLE strength normal and equal Skin: warm and dry  Neuro:  Alert and oriented X 3 MAE follows commands, no focal abnormalities noted Psych:  Normal affect   Relevant CV Studies: ECHO 03/27/21 IMPRESSIONS    1. Intracavitary gradient. Peak velocity 2.07 m/s. Peak gradient 17  mmHg.  Left ventricular ejection fraction, by estimation, is >75%. Left  ventricular ejection fraction by 2D MOD biplane is 76.9 %. Left  ventricular ejection fraction by PLAX is 76 %. The  left ventricle has hyperdynamic function. The left ventricle has no  regional wall motion  abnormalities. There is moderate concentric left  ventricular hypertrophy. Left ventricular diastolic parameters are  consistent with Grade I diastolic dysfunction  (impaired relaxation).  2. Right ventricular systolic function is normal. The right ventricular  size is normal. There is normal pulmonary artery systolic pressure.  3. The mitral valve is normal in structure. Trivial mitral valve  regurgitation. No evidence of mitral stenosis.  4. The aortic valve is calcified. There is mild calcification of the  aortic valve. There is mild thickening of the aortic valve. Aortic valve  regurgitation is not visualized. Mild aortic valve sclerosis is present,  with no evidence of aortic valve  stenosis.  5. Aortic dilatation noted. There is mild dilatation of the ascending  aorta, measuring 39 mm.  6. The inferior vena cava is normal in size with greater than 50%  respiratory variability, suggesting right atrial pressure of 3 mmHg.   FINDINGS  Left Ventricle: Intracavitary gradient. Peak velocity 2.07 m/s. Peak  gradient 17 mmHg. Left ventricular ejection fraction, by estimation, is  >75%. Left ventricular ejection fraction by PLAX is 76 %. Left ventricular  ejection fraction by 2D MOD biplane  is 76.9 %. The left ventricle has hyperdynamic function. The left  ventricle has no regional wall motion abnormalities. The left ventricular  internal cavity size was normal in size. There is moderate concentric left  ventricular hypertrophy. Left  ventricular diastolic parameters are consistent with Grade I diastolic  dysfunction (impaired relaxation). Normal left ventricular filling  pressure.   Right Ventricle: The right ventricular size is normal. No increase in  right ventricular wall thickness. Right ventricular systolic function is  normal. There is normal pulmonary artery systolic pressure. The tricuspid  regurgitant velocity is 2.10 m/s, and  with an assumed right atrial pressure  of 3 mmHg, the estimated right  ventricular systolic pressure is 09.7 mmHg.   Left Atrium: Left atrial size was normal in size.   Right Atrium: Right atrial size was normal in size.   Pericardium: Trivial pericardial effusion is present.   Mitral Valve: The mitral valve is normal in structure. Trivial mitral  valve regurgitation. No evidence of mitral valve stenosis.   Tricuspid Valve: The tricuspid valve is normal in structure. Tricuspid  valve regurgitation is trivial. No evidence of tricuspid stenosis.   Aortic Valve: The aortic valve is calcified. There is mild calcification  of the aortic valve. There is mild thickening of the aortic valve. Aortic  valve regurgitation is not visualized. Mild aortic valve sclerosis is  present, with no evidence of aortic  valve stenosis.   Pulmonic Valve: The pulmonic valve was normal in structure. Pulmonic valve  regurgitation is not visualized. No evidence of pulmonic stenosis.   Aorta: Aortic dilatation noted. There is mild dilatation of the ascending  aorta, measuring 39 mm.   Venous: The inferior vena cava is normal in size with greater than 50%  respiratory variability, suggesting right atrial pressure of 3 mmHg.   IAS/Shunts: No atrial level shunt detected by color flow Doppler.     LEFT VENTRICLE  PLAX 2D            Biplane EF (MOD)  LV EF:     Left  LV Biplane EF:  Left         ventricular           ventricular         ejection             ejection         fraction by           fraction by         PLAX is 76            2D MOD         %.                biplane is  LVIDd:     4.30 cm             76.9 %.  LVIDs:     2.40 cm  LV PW:     1.40 cm     Diastology  LV IVS:    1.40 cm     LV e' medial:  7.18 cm/s  LVOT diam:   2.00 cm     LV E/e' medial: 9.1  LV  SV:     58       LV e' lateral:  5.44 cm/s  LV SV Index:  25       LV E/e' lateral: 12.0  LVOT Area:   3.14 cm    LV Volumes (MOD)  LV vol d, MOD  57.7 ml  A2C:  LV vol d, MOD  36.6 ml  A4C:  LV vol s, MOD  14.9 ml  A2C:  LV vol s, MOD  7.8 ml  A4C:  LV SV MOD A2C:  42.8 ml  LV SV MOD A4C:  36.6 ml  LV SV MOD BP:  38.0 ml   RIGHT VENTRICLE       IVC  RV S prime:   18.60 cm/s IVC diam: 2.10 cm  TAPSE (M-mode): 2.0 cm   LEFT ATRIUM       Index    RIGHT ATRIUM      Index  LA diam:    2.90 cm 1.26 cm/m RA Area:   10.80 cm  LA Vol (A2C):  25.1 ml 10.90 ml/m RA Volume:  21.30 ml 9.25 ml/m  LA Vol (A4C):  45.2 ml 19.62 ml/m  LA Biplane Vol: 35.7 ml 15.50 ml/m  AORTIC VALVE  LVOT Vmax:  103.00 cm/s  LVOT Vmean: 71.200 cm/s  LVOT VTI:  0.184 m    AORTA  Ao Root diam: 3.60 cm  Ao Asc diam: 3.90 cm   Echo 01/14/20 IMPRESSIONS    1. Left ventricular ejection fraction, by visual estimation, is 60 to  65%. The left ventricle has normal function. There is moderately increased  left ventricular hypertrophy.  2. Moderate asymmetric hypertrophy of the basal septum measuring 72mm  (posterior wall 87mm)  3. The mitral valve is normal in structure. No evidence of mitral valve  regurgitation.  4. The aortic valve is tricuspid. Aortic valve regurgitation is trivial.  Mild aortic valve sclerosis without stenosis.  5. The tricuspid valve is grossly normal.  6. The pulmonic valve was not well visualized. Pulmonic valve  regurgitation is trivial.  7. Global right ventricle has normal systolic function.The right  ventricular size is normal.  8. Left atrial size was normal.  9. Right atrial size was normal.  10. TR signal is inadequate for assessing pulmonary artery systolic  pressure.   FINDINGS  Left  Ventricle: Left ventricular ejection fraction, by visual estimation,  is 60 to 65%. The left  ventricle has normal function. The left ventricle  has no regional wall motion abnormalities. There is moderately increased  left ventricular hypertrophy.  Asymmetric left ventricular hypertrophy. Left ventricular diastolic  parameters were normal.   Right Ventricle: The right ventricular size is normal. No increase in  right ventricular wall thickness. Global RV systolic function is has  normal systolic function.   Left Atrium: Left atrial size was normal in size.   Right Atrium: Right atrial size was normal in size   Pericardium: There is no evidence of pericardial effusion.   Mitral Valve: The mitral valve is normal in structure. No evidence of  mitral valve regurgitation.   Tricuspid Valve: The tricuspid valve is grossly normal. Tricuspid valve  regurgitation is not demonstrated.   Aortic Valve: The aortic valve is tricuspid. Aortic valve regurgitation is  trivial. Mild aortic valve sclerosis is present, with no evidence of  aortic valve stenosis.   Pulmonic Valve: The pulmonic valve was not well visualized. Pulmonic valve  regurgitation is trivial. Pulmonic regurgitation is trivial.   Aorta: The aortic root is normal in size and structure.   IAS/Shunts: The interatrial septum was not well visualized.     LEFT VENTRICLE  PLAX 2D  LVIDd:     4.72 cm    Diastology  LVIDs:     3.06 cm    LV e' lateral:  5.55 cm/s  LV PW:     1.17 cm    LV E/e' lateral: 12.2  LV IVS:    1.11 cm    LV e' medial:  5.22 cm/s  LVOT diam:   2.10 cm    LV E/e' medial: 13.0  LV SV:     67 ml  LV SV Index:  28.00  LVOT Area:   3.46 cm    LV Volumes (MOD)  LV area d, A2C:  27.50 cm  LV area d, A4C:  28.60 cm  LV area s, A2C:  15.50 cm  LV area s, A4C:  14.30 cm  LV major d, A2C:  8.63 cm  LV major d, A4C:  8.59 cm  LV major s, A2C:  6.89 cm  LV major s, A4C:  6.73 cm  LV vol d, MOD A2C: 73.6 ml  LV vol d, MOD A4C: 78.4 ml   LV vol s, MOD A2C: 30.2 ml  LV vol s, MOD A4C: 25.8 ml  LV SV MOD A2C:   43.4 ml  LV SV MOD A4C:   78.4 ml  LV SV MOD BP:   47.5 ml   Laboratory Data:  High Sensitivity Troponin:   Recent Labs  Lab 03/26/21 1230 03/26/21 1550 03/26/21 2111 03/27/21 0008  TROPONINIHS 7 7 5 6      Chemistry Recent Labs  Lab 03/26/21 1230 03/26/21 1313 03/27/21 0008 03/28/21 0529  NA 144 143 141 138  K 4.0 4.0 3.8 3.2*  CL 110  --  106 99  CO2 25  --  25 28  GLUCOSE 86  --  109* 100*  BUN 17  --  18 19  CREATININE 1.37*  --  1.41* 1.87*  CALCIUM 8.1*  --  8.4* 8.4*  GFRNONAA 54*  --  52* 37*  ANIONGAP 9  --  10 11    Recent Labs  Lab 03/26/21 1230  PROT 5.7*  ALBUMIN 3.5  AST 21  ALT 16  ALKPHOS 51  BILITOT 0.7   Hematology Recent Labs  Lab 03/26/21 1235 03/26/21 1313 03/27/21 0008 03/28/21 0529  WBC 8.5  --  10.2 9.5  RBC 4.71  --  4.69 4.60  HGB 11.1* 10.9* 11.0* 11.0*  HCT 36.4* 32.0* 36.2* 36.0*  MCV 77.3*  --  77.2* 78.3*  MCH 23.6*  --  23.5* 23.9*  MCHC 30.5  --  30.4 30.6  RDW 16.7*  --  17.1* 16.9*  PLT 242  --  232 223   BNP Recent Labs  Lab 03/26/21 1230  BNP 107.3*    DDimer No results for input(s): DDIMER in the last 168 hours.   Radiology/Studies:  DG Chest 2 View  Result Date: 03/26/2021 CLINICAL DATA:  Bilateral extremity swelling with shortness of breath for 2 weeks. History of congestive heart failure. EXAM: CHEST - 2 VIEW COMPARISON:  Radiographs 11/24/2020 and 01/13/2020.  CT 04/02/2020. FINDINGS: The heart size and mediastinal contours are stable. There is a stable calcified left upper lobe granuloma and calcified left hilar lymph nodes. The lungs are otherwise clear. There is blunting of both costophrenic angles posteriorly on the lateral view, consistent with small bilateral pleural effusions. No pneumothorax. The bones appear unchanged with changes of diffuse idiopathic skeletal hyperostosis in the thoracic spine. IMPRESSION: 1.  Small bilateral pleural effusions. No acute cardiopulmonary process. 2. Old granulomatous disease. Electronically Signed   By: Richardean Sale M.D.   On: 03/26/2021 13:03   CT Angio Chest PE W/Cm &/Or Wo Cm  Result Date: 03/26/2021 CLINICAL DATA:  PE suspected. High probability. Swelling in lower extremities for 1 week. Fall last night without injury. EXAM: CT ANGIOGRAPHY CHEST WITH CONTRAST TECHNIQUE: Multidetector CT imaging of the chest was performed using the standard protocol during bolus administration of intravenous contrast. Multiplanar CT image reconstructions and MIPs were obtained to evaluate the vascular anatomy. CONTRAST:  121mL OMNIPAQUE IOHEXOL 350 MG/ML SOLN COMPARISON:  CT chest with contrast 04/02/2020 FINDINGS: Cardiovascular: Heart size is normal. Coronary artery calcifications are present. Atherosclerotic changes are noted at the aortic arch. Great vessel origins are within normal limits. Pulmonary artery opacification is excellent. No focal filling defects are present to suggest pulmonary emboli. Mediastinum/Nodes: And no significant mediastinal, hilar, or axillary adenopathy is present. Thoracic inlet is within normal limits. The esophagus is unremarkable. Lungs/Pleura: Bilateral pleural effusions present. Minimal atelectasis is associated. No significant airspace disease is present otherwise. Calcified granuloma in the left upper lobe sub is stable Upper Abdomen: Limited imaging the abdomen is unremarkable. There is no significant adenopathy. No solid organ lesions are present. Musculoskeletal: No anterior osteophytes are fused through the thoracic spine. Vertebral body heights are maintained. No significant listhesis is present. No focal lytic or blastic lesions are present. Review of the MIP images confirms the above findings. IMPRESSION: 1. No evidence for pulmonary embolus. 2. Bilateral pleural effusions with minimal atelectasis. 3. Coronary artery disease. 4. Fusion of the anterior  osteophytes through the thoracic spine compatible with diffuse idiopathic skeletal hyperostosis. 5. Aortic Atherosclerosis (ICD10-I70.0). Electronically Signed   By: San Morelle M.D.   On: 03/26/2021 16:07   CT ANGIO AO+BIFEM W & OR WO CONTRAST  Result Date: 03/26/2021 CLINICAL DATA:  75 year old male with a one-week history of lower extremity swelling and additional symptoms concerning for possible pulmonary embolus EXAM: CT ANGIOGRAPHY OF ABDOMINAL AORTA WITH ILIOFEMORAL RUNOFF TECHNIQUE: Multidetector CT imaging of the abdomen, pelvis and lower extremities was performed using the standard protocol during bolus administration of intravenous contrast. Multiplanar CT  image reconstructions and MIPs were obtained to evaluate the vascular anatomy. CONTRAST:  164mL OMNIPAQUE IOHEXOL 350 MG/ML SOLN COMPARISON:  Prior CT scan of the abdomen and pelvis 04/02/2020 FINDINGS: VASCULAR Aorta: Normal caliber aorta without aneurysm, dissection, vasculitis or significant stenosis. Mild calcified atherosclerotic plaque along the abdominal aorta. Celiac: Patent without evidence of aneurysm, dissection, vasculitis or significant stenosis. SMA: Patent without evidence of aneurysm, dissection, vasculitis or significant stenosis. Renals: Both renal arteries are patent without evidence of aneurysm, dissection, vasculitis, fibromuscular dysplasia or significant stenosis. IMA: Patent without evidence of aneurysm, dissection, vasculitis or significant stenosis. RIGHT Lower Extremity Inflow: Common, internal and external iliac arteries are patent without evidence of aneurysm, dissection, vasculitis or significant stenosis. Outflow: Common, superficial and profunda femoral arteries and the popliteal artery are patent without evidence of aneurysm, dissection, vasculitis or significant stenosis. Runoff: Limited evaluation secondary to venous contamination. Grossly patent 3 vessel runoff to the ankle. LEFT Lower Extremity Inflow:  Common, internal and external iliac arteries are patent without evidence of aneurysm, dissection, vasculitis or significant stenosis. Outflow: Common, superficial and profunda femoral arteries and the popliteal artery are patent without evidence of aneurysm, dissection, vasculitis or significant stenosis. Runoff: Limited evaluation secondary to venous contamination. Grossly patent 3 vessel runoff to the ankle. Veins: No focal venous abnormality. Review of the MIP images confirms the above findings. NON-VASCULAR Lower chest: Small bilateral pleural effusions. Hepatobiliary: No acute abnormality within the visualized portion of the liver. Pancreas: No acute abnormality in the visualized portions of the pancreas. Spleen: No acute abnormality in the visualized portions of the spleen. Adrenals/Urinary Tract: Unremarkable adrenal glands. No evidence of hydronephrosis. No evidence of enhancing renal mass. Multifocal nephrolithiasis bilaterally. The ureters and bladder are unremarkable. Stomach/Bowel: No evidence of bowel obstruction. No focal bowel wall thickening. Scattered colonic diverticula without evidence of active inflammation. Lymphatic: No suspicious lymphadenopathy. Reproductive: Surgical changes of prior prostatectomy. Other: No abdominal wall hernia or abnormality. No abdominopelvic ascites. Musculoskeletal: Surgical changes of prior L4-L5 posterior lumbar interbody fusion with interbody graft. Additional surgical changes of prior long-stem right hip unipolar arthroplasty. IMPRESSION: VASCULAR 1. Mild scattered atherosclerotic plaque without significant stenosis, occlusion, aneurysm or dissection. 2. No findings concerning for caval, iliac or femoral DVT. 3.  Aortic Atherosclerosis (ICD10-I70.0). NON-VASCULAR 1. Small bilateral pleural effusions. 2. Multifocal bilateral nephrolithiasis without evidence of obstruction. 3. Colonic diverticular disease without CT evidence of active inflammation. 4. Surgical  changes of prior L4-L5 posterior lumbar interbody fusion with interbody graft. 5. Surgical changes of right long-stem unipolar hip arthroplasty prosthesis. Electronically Signed   By: Jacqulynn Cadet M.D.   On: 03/26/2021 15:41   ECHOCARDIOGRAM COMPLETE  Result Date: 03/27/2021    ECHOCARDIOGRAM REPORT   Patient Name:   Bruce Ball Date of Exam: 03/27/2021 Medical Rec #:  [id number]       Height:       71.0 in Accession #:    [id number]      Weight:       246.3 lb Date of Birth:  1946/11/16       BSA:          2.303 m Patient Age:    55 years        BP:           134/60 mmHg Patient Gender: M               HR:           75 bpm. Exam Location:  Inpatient Procedure:  2D Echo, 3D Echo, Cardiac Doppler and Color Doppler Indications:    I50.40* Unspecified combined systolic (congestive) and diastolic                 (congestive) heart failure  History:        Patient has prior history of Echocardiogram examinations, most                 recent 01/14/2020. CHF, Signs/Symptoms:Shortness of Breath and                 Dyspnea; Risk Factors:Dyslipidemia and Hypertension.  Sonographer:    Roseanna Rainbow RDCS Referring Phys: [id number] [facility]  Sonographer Comments: Technically difficult study due to poor echo windows. Image acquisition challenging due to patient body habitus. IMPRESSIONS  1. Intracavitary gradient. Peak velocity 2.07 m/s. Peak gradient 17 mmHg. Left ventricular ejection fraction, by estimation, is >75%. Left ventricular ejection fraction by 2D MOD biplane is 76.9 %. Left ventricular ejection fraction by PLAX is 76 %. The  left ventricle has hyperdynamic function. The left ventricle has no regional wall motion abnormalities. There is moderate concentric left ventricular hypertrophy. Left ventricular diastolic parameters are consistent with Grade I diastolic dysfunction (impaired relaxation).  2. Right ventricular systolic function is normal. The right ventricular size is normal. There is normal  pulmonary artery systolic pressure.  3. The mitral valve is normal in structure. Trivial mitral valve regurgitation. No evidence of mitral stenosis.  4. The aortic valve is calcified. There is mild calcification of the aortic valve. There is mild thickening of the aortic valve. Aortic valve regurgitation is not visualized. Mild aortic valve sclerosis is present, with no evidence of aortic valve stenosis.  5. Aortic dilatation noted. There is mild dilatation of the ascending aorta, measuring 39 mm.  6. The inferior vena cava is normal in size with greater than 50% respiratory variability, suggesting right atrial pressure of 3 mmHg. FINDINGS  Left Ventricle: Intracavitary gradient. Peak velocity 2.07 m/s. Peak gradient 17 mmHg. Left ventricular ejection fraction, by estimation, is >75%. Left ventricular ejection fraction by PLAX is 76 %. Left ventricular ejection fraction by 2D MOD biplane is 76.9 %. The left ventricle has hyperdynamic function. The left ventricle has no regional wall motion abnormalities. The left ventricular internal cavity size was normal in size. There is moderate concentric left ventricular hypertrophy. Left ventricular diastolic parameters are consistent with Grade I diastolic dysfunction (impaired relaxation). Normal left ventricular filling pressure. Right Ventricle: The right ventricular size is normal. No increase in right ventricular wall thickness. Right ventricular systolic function is normal. There is normal pulmonary artery systolic pressure. The tricuspid regurgitant velocity is 2.10 m/s, and  with an assumed right atrial pressure of 3 mmHg, the estimated right ventricular systolic pressure is 87.5 mmHg. Left Atrium: Left atrial size was normal in size. Right Atrium: Right atrial size was normal in size. Pericardium: Trivial pericardial effusion is present. Mitral Valve: The mitral valve is normal in structure. Trivial mitral valve regurgitation. No evidence of mitral valve stenosis.  Tricuspid Valve: The tricuspid valve is normal in structure. Tricuspid valve regurgitation is trivial. No evidence of tricuspid stenosis. Aortic Valve: The aortic valve is calcified. There is mild calcification of the aortic valve. There is mild thickening of the aortic valve. Aortic valve regurgitation is not visualized. Mild aortic valve sclerosis is present, with no evidence of aortic valve stenosis. Pulmonic Valve: The pulmonic valve was normal in structure. Pulmonic valve regurgitation is not visualized. No evidence  of pulmonic stenosis. Aorta: Aortic dilatation noted. There is mild dilatation of the ascending aorta, measuring 39 mm. Venous: The inferior vena cava is normal in size with greater than 50% respiratory variability, suggesting right atrial pressure of 3 mmHg. IAS/Shunts: No atrial level shunt detected by color flow Doppler.  LEFT VENTRICLE PLAX 2D                        Biplane EF (MOD) LV EF:         Left            LV Biplane EF:   Left                ventricular                      ventricular                ejection                         ejection                fraction by                      fraction by                PLAX is 76                       2D MOD                %.                               biplane is LVIDd:         4.30 cm                          76.9 %. LVIDs:         2.40 cm LV PW:         1.40 cm         Diastology LV IVS:        1.40 cm         LV e' medial:    7.18 cm/s LVOT diam:     2.00 cm         LV E/e' medial:  9.1 LV SV:         58              LV e' lateral:   5.44 cm/s LV SV Index:   25              LV E/e' lateral: 12.0 LVOT Area:     3.14 cm  LV Volumes (MOD) LV vol d, MOD    57.7 ml A2C: LV vol d, MOD    36.6 ml A4C: LV vol s, MOD    14.9 ml A2C: LV vol s, MOD    7.8 ml A4C: LV SV MOD A2C:   42.8 ml LV SV MOD A4C:   36.6 ml LV SV MOD BP:    38.0 ml RIGHT VENTRICLE             IVC RV S prime:     18.60 cm/s  IVC diam: 2.10 cm TAPSE (M-mode): 2.0 cm  LEFT  ATRIUM             Index       RIGHT ATRIUM           Index LA diam:        2.90 cm 1.26 cm/m  RA Area:     10.80 cm LA Vol (A2C):   25.1 ml 10.90 ml/m RA Volume:   21.30 ml  9.25 ml/m LA Vol (A4C):   45.2 ml 19.62 ml/m LA Biplane Vol: 35.7 ml 15.50 ml/m  AORTIC VALVE LVOT Vmax:   103.00 cm/s LVOT Vmean:  71.200 cm/s LVOT VTI:    0.184 m  AORTA Ao Root diam: 3.60 cm Ao Asc diam:  3.90 cm MITRAL VALVE               TRICUSPID VALVE MV Area (PHT): 2.76 cm    TR Peak grad:   17.6 mmHg MV Decel Time: 275 msec    TR Vmax:        210.00 cm/s MV E velocity: 65.50 cm/s MV A velocity: 88.10 cm/s  SHUNTS MV E/A ratio:  0.74        Systemic VTI:  0.18 m                            Systemic Diam: 2.00 cm Skeet Latch MD Electronically signed by Skeet Latch MD Signature Date/Time: 03/27/2021/10:45:30 AM    Final    VAS [name] LOWER EXTREMITY VENOUS (DVT)  Result Date: 03/27/2021  Lower Venous DVT Study Indications: SOB and Edema (CHF exacerbation & CKD3).  Risk Factors: HX of PE - 2021. Limitations: Poor ultrasound/tissue interface and body habitus. Comparison Study: Previous exam 10/16/20 - Negative Performing Technologist: Rogelia Rohrer  Examination Guidelines: A complete evaluation includes B-mode imaging, spectral Doppler, color Doppler, and power Doppler as needed of all accessible portions of each vessel. Bilateral testing is considered an integral part of a complete examination. Limited examinations for reoccurring indications may be performed as noted. The reflux portion of the exam is performed with the patient in reverse Trendelenburg.  +---------+---------------+---------+-----------+----------+-------------------+ RIGHT    CompressibilityPhasicitySpontaneityPropertiesThrombus Aging      +---------+---------------+---------+-----------+----------+-------------------+ CFV      Full           Yes      Yes                                       +---------+---------------+---------+-----------+----------+-------------------+ SFJ      Full                                                             +---------+---------------+---------+-----------+----------+-------------------+ FV Prox  Full           Yes      Yes                                      +---------+---------------+---------+-----------+----------+-------------------+ FV Mid   Full           Yes      Yes                                      +---------+---------------+---------+-----------+----------+-------------------+  FV DistalFull           Yes      Yes                                      +---------+---------------+---------+-----------+----------+-------------------+ PFV      Full                                                             +---------+---------------+---------+-----------+----------+-------------------+ POP      Full           Yes      Yes                                      +---------+---------------+---------+-----------+----------+-------------------+ PTV      Full                                         Not well visualized +---------+---------------+---------+-----------+----------+-------------------+ PERO     Full                                         Not well visualized +---------+---------------+---------+-----------+----------+-------------------+   +---------+---------------+---------+-----------+----------+------------------+ LEFT     CompressibilityPhasicitySpontaneityPropertiesThrombus Aging     +---------+---------------+---------+-----------+----------+------------------+ CFV      Full           Yes      Yes                                     +---------+---------------+---------+-----------+----------+------------------+ SFJ      Full                                                            +---------+---------------+---------+-----------+----------+------------------+ FV Prox   Full           Yes      Yes                                     +---------+---------------+---------+-----------+----------+------------------+ FV Mid   Full           Yes      Yes                                     +---------+---------------+---------+-----------+----------+------------------+ FV DistalFull           Yes      Yes                                     +---------+---------------+---------+-----------+----------+------------------+  PFV      Full                                                            +---------+---------------+---------+-----------+----------+------------------+ POP      Full           Yes      Yes                                     +---------+---------------+---------+-----------+----------+------------------+ PTV      Full                                                            +---------+---------------+---------+-----------+----------+------------------+ PERO                                                  Not seen on this                                                         exam               +---------+---------------+---------+-----------+----------+------------------+   Left Technical Findings: Not visualized segments include Peroneal veins.   Summary: BILATERAL: - No evidence of deep vein thrombosis seen in the lower extremities, bilaterally. - No evidence of superficial venous thrombosis in the lower extremities, bilaterally. -No evidence of popliteal cyst, bilaterally.   *See table(s) above for measurements and observations. Electronically signed by Curt Jews MD on 03/27/2021 at 9:44:26 PM.    Final      Assessment and Plan:   1. Acute diastolic HF, after road trip of driving >6503 miles.  Diet on trip was with increased salt.    Now diuresing and wt down 16 lbs since admit.  EF is hyperdynamic Dr. Radford Pax to see.  Over all improved 2. CAD by CR ie calcifications. No angina.  Possible cardiac eval to  clarify though neg troponins.  Could be done as outpt, defer to Dr. Radford Pax we will order ca+ score as out pt  3. HTN now diuresed. Coreg 25 BID, indapamide 1.25 daily. Was not on imdur (this had been added at one point due to abnormal EKG but was not his EKG)  Would stop NTG paste if ok with Dr. Radford Pax  4. Hx PE on eliquis continue  5. HLD on lipitor continue and let PCP continue to follow 6. Chronic back pain.  7. CKD will ask [facility] to refer to nephrology at discharge.  Risk Assessment/Risk Scores:        New York Heart Association (NYHA) Functional Class NYHA Class III        For questions or updates, please contact [facility] Please consult [email or web address] for contact info under  Signed, Cecilie Kicks, NP  03/28/2021 10:58 AM

## 2021-03-29 ENCOUNTER — Telehealth: Payer: Self-pay | Admitting: Family Medicine

## 2021-03-29 LAB — BASIC METABOLIC PANEL
Anion gap: 12 (ref 5–15)
BUN: 19 mg/dL (ref 8–23)
CO2: 26 mmol/L (ref 22–32)
Calcium: 8.4 mg/dL — ABNORMAL LOW (ref 8.9–10.3)
Chloride: 99 mmol/L (ref 98–111)
Creatinine, Ser: 1.53 mg/dL — ABNORMAL HIGH (ref 0.61–1.24)
GFR, Estimated: 47 mL/min — ABNORMAL LOW (ref >60)
Glucose, Bld: 90 mg/dL (ref 70–99)
Potassium: 3.2 mmol/L — ABNORMAL LOW (ref 3.5–5.1)
Sodium: 137 mmol/L (ref 135–145)

## 2021-03-29 LAB — MAGNESIUM: Magnesium: 2 mg/dL (ref 1.7–2.4)

## 2021-03-29 LAB — CK: Total CK: 115 U/L (ref 49–397)

## 2021-03-29 MED ORDER — CYCLOBENZAPRINE HCL 5 MG PO TABS: 2.5000 mg | ORAL_TABLET | Freq: Two times a day (BID) | ORAL | Status: DC | PRN

## 2021-03-29 MED ORDER — ACETAMINOPHEN 325 MG PO TABS: 650.0000 mg | ORAL_TABLET | Freq: Four times a day (QID) | ORAL | Status: DC | PRN

## 2021-03-29 MED ORDER — POTASSIUM CHLORIDE CRYS ER 20 MEQ PO TBCR
30.0000 meq | EXTENDED_RELEASE_TABLET | ORAL | Status: DC
  Administered 2021-03-29: 30 meq via ORAL
  Filled 2021-03-29: qty 1

## 2021-03-29 NOTE — Care Management Important Message (Signed)
Important Message  Patient Details IM Letter given to the Patient. Name: ADAMA FERBER MRN: [id number] Date of Birth: 1946-10-29   Medicare Important Message Given:  Yes     Kerin Salen 03/29/2021, 11:38 AM

## 2021-03-29 NOTE — Progress Notes (Signed)
Pt discharged at this time. All discharge instructions went over with pt and wife. No questions or concerns voiced. Pt transported out via wheelchair, accompanied by staff.

## 2021-03-29 NOTE — Discharge Instructions (Signed)

## 2021-03-29 NOTE — Chronic Care Management (AMB) (Signed)
  Chronic Care Management   Note  03/29/2021 Name: Bruce Ball MRN: [id number] DOB: 1946/09/09  Bruce Ball is a 75 y.o. year old male who is a primary care patient of [name], MD. I reached out to [name] by phone today in response to a referral sent by [name]'s PCP, [name], MD.   [name] was given information about Chronic Care Management services today including:  1. CCM service includes personalized support from designated clinical staff supervised by his physician, including individualized plan of care and coordination with other care providers 2. 24/7 contact phone numbers for assistance for urgent and routine care needs. 3. Service will only be billed when office clinical staff spend 20 minutes or more in a month to coordinate care. 4. Only one practitioner may furnish and bill the service in a calendar month. 5. The patient may stop CCM services at any time (effective at the end of the month) by phone call to the office staff.   Patient agreed to services and verbal consent obtained.   Follow up plan:   Lauretta Grill [facility] Scheduler

## 2021-03-29 NOTE — Progress Notes (Signed)
Physical Therapy Treatment Patient Details Name: Bruce Ball MRN: [id number] DOB: 1946-04-16 Today's Date: 03/29/2021    History of Present Illness Pt is 75 yo male admitted with CHF exac, bil pleural effusions on 03/26/21. Hx of PE, L LE DVT, hearing loss, falls, chronic pain, CKD, CAD    PT Comments    Pt continues to demonstrate good progress. Able to ambulate 200' with and without RW.  Pt does have antalgic, slow gait but reports this is his baseline. Continue to recommend home without any HHPT needs.  Did discuss with pt that if he ever wanted to address chronic back and R knee pain could seek outpt PT referral from PCP.    Follow Up Recommendations  No PT follow up;Supervision - Intermittent     Equipment Recommendations  None recommended by PT    Recommendations for Other Services       Precautions / Restrictions Precautions Precautions: Fall Restrictions Weight Bearing Restrictions: No    Mobility  Bed Mobility Overal bed mobility: Modified Independent Bed Mobility: Supine to Sit;Sit to Supine     Supine to sit: Supervision Sit to supine: Supervision   General bed mobility comments: Use of bed rail and increased time    Transfers Overall transfer level: Needs assistance Equipment used: None Transfers: Sit to/from Stand Sit to Stand: Min guard         General transfer comment: Min guard for safety; cues for hand placement  Ambulation/Gait Ambulation/Gait assistance: Min guard;Supervision Gait Distance (Feet): 200 Feet Assistive device: Rolling walker (2 wheeled);None Gait Pattern/deviations: Step-through pattern;Wide base of support;Decreased weight shift to right;Antalgic Gait velocity: decreased   General Gait Details: Pt has a limp on R side but reports baseline due to R hip rod and R knee pain.  Has wide BOS and slower gait but agian reports baseline.  Started with RW progressed to no AD   Stairs             Wheelchair Mobility     Modified Rankin (Stroke Patients Only)       Balance Overall balance assessment: Needs assistance Sitting-balance support: No upper extremity supported Sitting balance-Leahy Scale: Normal     Standing balance support: Bilateral upper extremity supported;No upper extremity supported Standing balance-Leahy Scale: Good                              Cognition Arousal/Alertness: Awake/alert Behavior During Therapy: WFL for tasks assessed/performed Overall Cognitive Status: Within Functional Limits for tasks assessed                                        Exercises      General Comments General comments (skin integrity, edema, etc.): VSS; Pt and wife feel pt at or near baseline.  Educated no recommendations for HHPT since pt near baseline.  Did discuss if he wanted to address chronic back pain and knee pain could seek outpt PT referral from PCP.      Pertinent Vitals/Pain Pain Assessment: Faces Faces Pain Scale: Hurts little more Pain Location: upper back and R knee - both chronic Pain Descriptors / Indicators: Aching;Discomfort Pain Intervention(s): Limited activity within patient's tolerance;Monitored during session;Repositioned;Heat applied    Home Living                      Prior  Function            PT Goals (current goals can now be found in the care plan section) Acute Rehab PT Goals Patient Stated Goal: to get better and get home PT Goal Formulation: With patient/family Time For Goal Achievement: 04/10/21 Potential to Achieve Goals: Good Progress towards PT goals: Progressing toward goals    Frequency    Min 3X/week      PT Plan Current plan remains appropriate    Co-evaluation              AM-PAC PT "6 Clicks" Mobility   Outcome Measure  Help needed turning from your back to your side while in a flat bed without using bedrails?: None Help needed moving from lying on your back to sitting on the side of a  flat bed without using bedrails?: None Help needed moving to and from a bed to a chair (including a wheelchair)?: A Little Help needed standing up from a chair using your arms (e.g., wheelchair or bedside chair)?: A Little Help needed to walk in hospital room?: A Little Help needed climbing 3-5 steps with a railing? : A Little 6 Click Score: 20    End of Session Equipment Utilized During Treatment: Gait belt Activity Tolerance: Patient tolerated treatment well Patient left: in chair;with call bell/phone within reach;with family/visitor present Nurse Communication: Mobility status PT Visit Diagnosis: Unsteadiness on feet (R26.81)     Time: 7544-9201 PT Time Calculation (min) (ACUTE ONLY): 20 min  Charges:  $Gait Training: 8-22 mins                     Abran Richard, PT Acute Rehab Services Pager [phone] [facility] [phone]     Karlton Lemon 03/29/2021, 11:41 AM

## 2021-03-29 NOTE — Discharge Summary (Signed)
Physician Discharge Summary  Bruce Ball GQB:[id number] DOB: [birth year]  PCP: [name], MD  Admitted from: Home Discharged to: Home  Admit date: 03/26/2021 Discharge date: 03/29/2021  Recommendations for Outpatient Follow-up:    Follow-up Information    Sueanne Margarita, MD Follow up on 04/25/2021.   Specialty: Cardiology Why: at 1: 71 Pm with her PA Ermalinda Barrios. Contact information: [address] [place] [address] [phone]        [name], MD. Schedule an appointment as soon as possible for a visit in 1 week(s).   Specialty: Family Medicine Why: To be seen with repeat labs (CBC & BMP).  Kindly reassess regarding need for adjusting diuretics.  Recommend outpatient Nephrology consultation. Contact information: [place] [id number] [phone]                Home Health: None recommended by PT    Equipment/Devices: None    Discharge Condition: Improved and stable.   Code Status: Full Code Diet recommendation:  Discharge Diet Orders (From admission, onward)    Start     Ordered   03/29/21 0000  Diet - low sodium heart healthy        03/29/21 1035           Discharge Diagnoses:  Principal Problem:   CHF exacerbation ([place]) Active Problems:   Essential hypertension   Peripheral edema   DDD (degenerative disc disease), thoracic   Chronic kidney disease (CKD), stage III (moderate) (HCC)   Shortness of breath   Chronic anticoagulation   Brief Summary: 75 year old married male with medical history significant for but not limited to HTN, CAD, CKD 3a, DDD with chronic back pain, PE in January 2021 on Eliquis, deaf in right ear, presented to the ED on 3/28 with 2 weeks history of progressive bilateral lower extremity edema, abdominal distention and dyspnea.  He recently had a 2400 mile road trip and noted gradual swelling of his extremities during the trip.  He also reported progressive  weakness, orthopnea because of which she had been sleeping in a recliner.  Admitted for acute diastolic CHF and hypertensive urgency.  Cardiology consulted.   Assessment & Plan:   Acute diastolic CHF Likely precipitated by poorly controlled hypertension/hypertensive urgency.  Also contributed by high salt diet.  Diuresed with IV Lasix and -4.8 L thus far.  Approximately 17 pounds weight down since admission.  Volume status has significantly improved, creatinine trending up and hence IV Lasix discontinued 3/30.  Patient/family requested cardiology consult.  Cardiology input appreciated: Recommend not restarting Lasix, already on diuretic/indapamide for kidney stones to continue.  Stressed with patient and spouse at bedside regarding importance of consistent low-sodium diet and adequate blood pressure control.  CTA chest negative for PE.  Can get outpatient coronary calcium score, Cardiology have arranged follow-up for same.  Cardiology signed off.  Continue carvedilol.  No need to resume Imdur-discontinued.  Improved and stable.  Acute on stage IIIa chronic kidney disease Does not follow with nephrology.  Chronic kidney disease likely due to chronically poorly controlled hypertension.  AKI due to aggressive diuresis.  Baseline creatinine maybe in the 1.4.  Creatinine peaked to 1.8.  IV Lasix discontinued.  Creatinine improved down to 1.53.  Recommend outpatient nephrology consultation.  Patient already aware to avoid NSAIDs but this was reiterated.  Follow up BMP closely as outpatient  Hypertension/hypertensive urgency Improved control.  Continue carvedilol.  Discontinued Imdur.  Low-salt diet recommended.  Close outpatient follow-up.  Anemia in CKD Stable.  However has microcytic anemia which will need to be evaluated as outpatient  Hypokalemia Replaced aggressively prior to discharge.  Magnesium 2.  Continue potassium supplements at discharge and close outpatient follow-up of  BMP  Hyperlipidemia  Continue atorvastatin  CAD No anginal symptoms.  Troponins negative.  Per cardiology, will get outpatient calcium scores.  History of DVT/PE in 2021 Per prior notes, following 4 years of anticoagulation with Eliquis, patient stated that Dr. Alvy Bimler switched him to half dose Eliquis 2.5 mg twice daily.  CTA chest negative for new PE.  Bilateral lower extremity venous Dopplers without DVT or SVT.  Continue Eliquis and outpatient follow-up.  Degenerative disc disease/chronic back pain Reportedly started having back pain and spasms following lying supine for CT.  Supportive care.  As needed Flexeril. Still has some right upper back pain, reproducible at times, localized.  Flexeril made him sleepy yesterday and does not want it at discharge.  Recommended Tylenol, heat pack as needed and topical pain patches i.e. Salonpas or topical pain cream such as BenGay.  They verbalized understanding.  PT evaluated and did not see any home needs. Patient and spouse also indicate that he has been on chronic prednisone for back pain which is being weaned off to discontinue over the next several days.  Continue same at discharge.  Hiatal hernia/GERD Patient reports taking Reglan, continue.  Continue PPI.  Microscopic hematuria Unclear etiology.  Recommend repeating urine microscopy in 2 to 3 weeks and if this persists then will need further evaluation including follow-up with his urologist.  It could be related to his nephrolithiasis.  Body mass index is 34.1 kg/m.      Consultants:   Cardiology  Procedures:   None   Discharge Instructions  Discharge Instructions    (HEART FAILURE PATIENTS) Call MD:  Anytime you have any of the following symptoms: 1) 3 pound weight gain in 24 hours or 5 pounds in 1 week 2) shortness of breath, with or without a dry hacking cough 3) swelling in the hands, feet or stomach 4) if you have to sleep on extra pillows at night in order to  breathe.   Complete by: As directed    Call MD for:  difficulty breathing, headache or visual disturbances   Complete by: As directed    Call MD for:  extreme fatigue   Complete by: As directed    Call MD for:  persistant dizziness or light-headedness   Complete by: As directed    Call MD for:  persistant nausea and vomiting   Complete by: As directed    Call MD for:  severe uncontrolled pain   Complete by: As directed    Call MD for:  temperature >100.4   Complete by: As directed    Diet - low sodium heart healthy   Complete by: As directed    Increase activity slowly   Complete by: As directed        Medication List    STOP taking these medications   isosorbide mononitrate 30 MG 24 hr tablet Commonly known as: IMDUR     TAKE these medications   acetaminophen 325 MG tablet Commonly known as: TYLENOL Take 2 tablets (650 mg total) by mouth every 6 (six) hours as needed for mild pain or moderate pain.   apixaban 2.5 MG Tabs tablet Commonly known as: Eliquis Take 1 tablet (2.5 mg total) by mouth 2 (two) times daily.   atorvastatin  20 MG tablet Commonly known as: LIPITOR Take 20 mg at bedtime by mouth.   carvedilol 25 MG tablet Commonly known as: COREG Take 25 mg by mouth 2 (two) times daily.   indapamide 1.25 MG tablet Commonly known as: LOZOL Take 1.25 mg by mouth daily.   metoCLOPramide 5 MG tablet Commonly known as: REGLAN TAKE 1 TABLET (5 MG TOTAL) BY MOUTH 3 (THREE) TIMES DAILY BEFORE MEALS.   omeprazole 20 MG capsule Commonly known as: PRILOSEC Take 40 mg by mouth 2 (two) times daily.   predniSONE 5 MG tablet Commonly known as: DELTASONE Take 5 mg by mouth daily with breakfast.   Vitamin D3 50 MCG (2000 UT) capsule Take 2,000 Units by mouth in the morning and at bedtime.      No Known Allergies    Procedures/Studies: DG Chest 2 View  Result Date: 03/26/2021 CLINICAL DATA:  Bilateral extremity swelling with shortness of breath for 2 weeks.  History of congestive heart failure. EXAM: CHEST - 2 VIEW COMPARISON:  Radiographs 11/24/2020 and 01/13/2020.  CT 04/02/2020. FINDINGS: The heart size and mediastinal contours are stable. There is a stable calcified left upper lobe granuloma and calcified left hilar lymph nodes. The lungs are otherwise clear. There is blunting of both costophrenic angles posteriorly on the lateral view, consistent with small bilateral pleural effusions. No pneumothorax. The bones appear unchanged with changes of diffuse idiopathic skeletal hyperostosis in the thoracic spine. IMPRESSION: 1. Small bilateral pleural effusions. No acute cardiopulmonary process. 2. Old granulomatous disease. Electronically Signed   By: Richardean Sale M.D.   On: 03/26/2021 13:03   CT Angio Chest PE W/Cm &/Or Wo Cm  Result Date: 03/26/2021 CLINICAL DATA:  PE suspected. High probability. Swelling in lower extremities for 1 week. Fall last night without injury. EXAM: CT ANGIOGRAPHY CHEST WITH CONTRAST TECHNIQUE: Multidetector CT imaging of the chest was performed using the standard protocol during bolus administration of intravenous contrast. Multiplanar CT image reconstructions and MIPs were obtained to evaluate the vascular anatomy. CONTRAST:  135mL OMNIPAQUE IOHEXOL 350 MG/ML SOLN COMPARISON:  CT chest with contrast 04/02/2020 FINDINGS: Cardiovascular: Heart size is normal. Coronary artery calcifications are present. Atherosclerotic changes are noted at the aortic arch. Great vessel origins are within normal limits. Pulmonary artery opacification is excellent. No focal filling defects are present to suggest pulmonary emboli. Mediastinum/Nodes: And no significant mediastinal, hilar, or axillary adenopathy is present. Thoracic inlet is within normal limits. The esophagus is unremarkable. Lungs/Pleura: Bilateral pleural effusions present. Minimal atelectasis is associated. No significant airspace disease is present otherwise. Calcified granuloma in the  left upper lobe sub is stable Upper Abdomen: Limited imaging the abdomen is unremarkable. There is no significant adenopathy. No solid organ lesions are present. Musculoskeletal: No anterior osteophytes are fused through the thoracic spine. Vertebral body heights are maintained. No significant listhesis is present. No focal lytic or blastic lesions are present. Review of the MIP images confirms the above findings. IMPRESSION: 1. No evidence for pulmonary embolus. 2. Bilateral pleural effusions with minimal atelectasis. 3. Coronary artery disease. 4. Fusion of the anterior osteophytes through the thoracic spine compatible with diffuse idiopathic skeletal hyperostosis. 5. Aortic Atherosclerosis (ICD10-I70.0). Electronically Signed   By: San Morelle M.D.   On: 03/26/2021 16:07   CT ANGIO AO+BIFEM W & OR WO CONTRAST  Result Date: 03/26/2021 CLINICAL DATA:  75 year old male with a one-week history of lower extremity swelling and additional symptoms concerning for possible pulmonary embolus EXAM: CT ANGIOGRAPHY OF ABDOMINAL AORTA WITH  ILIOFEMORAL RUNOFF TECHNIQUE: Multidetector CT imaging of the abdomen, pelvis and lower extremities was performed using the standard protocol during bolus administration of intravenous contrast. Multiplanar CT image reconstructions and MIPs were obtained to evaluate the vascular anatomy. CONTRAST:  181mL OMNIPAQUE IOHEXOL 350 MG/ML SOLN COMPARISON:  Prior CT scan of the abdomen and pelvis 04/02/2020 FINDINGS: VASCULAR Aorta: Normal caliber aorta without aneurysm, dissection, vasculitis or significant stenosis. Mild calcified atherosclerotic plaque along the abdominal aorta. Celiac: Patent without evidence of aneurysm, dissection, vasculitis or significant stenosis. SMA: Patent without evidence of aneurysm, dissection, vasculitis or significant stenosis. Renals: Both renal arteries are patent without evidence of aneurysm, dissection, vasculitis, fibromuscular dysplasia or  significant stenosis. IMA: Patent without evidence of aneurysm, dissection, vasculitis or significant stenosis. RIGHT Lower Extremity Inflow: Common, internal and external iliac arteries are patent without evidence of aneurysm, dissection, vasculitis or significant stenosis. Outflow: Common, superficial and profunda femoral arteries and the popliteal artery are patent without evidence of aneurysm, dissection, vasculitis or significant stenosis. Runoff: Limited evaluation secondary to venous contamination. Grossly patent 3 vessel runoff to the ankle. LEFT Lower Extremity Inflow: Common, internal and external iliac arteries are patent without evidence of aneurysm, dissection, vasculitis or significant stenosis. Outflow: Common, superficial and profunda femoral arteries and the popliteal artery are patent without evidence of aneurysm, dissection, vasculitis or significant stenosis. Runoff: Limited evaluation secondary to venous contamination. Grossly patent 3 vessel runoff to the ankle. Veins: No focal venous abnormality. Review of the MIP images confirms the above findings. NON-VASCULAR Lower chest: Small bilateral pleural effusions. Hepatobiliary: No acute abnormality within the visualized portion of the liver. Pancreas: No acute abnormality in the visualized portions of the pancreas. Spleen: No acute abnormality in the visualized portions of the spleen. Adrenals/Urinary Tract: Unremarkable adrenal glands. No evidence of hydronephrosis. No evidence of enhancing renal mass. Multifocal nephrolithiasis bilaterally. The ureters and bladder are unremarkable. Stomach/Bowel: No evidence of bowel obstruction. No focal bowel wall thickening. Scattered colonic diverticula without evidence of active inflammation. Lymphatic: No suspicious lymphadenopathy. Reproductive: Surgical changes of prior prostatectomy. Other: No abdominal wall hernia or abnormality. No abdominopelvic ascites. Musculoskeletal: Surgical changes of prior  L4-L5 posterior lumbar interbody fusion with interbody graft. Additional surgical changes of prior long-stem right hip unipolar arthroplasty. IMPRESSION: VASCULAR 1. Mild scattered atherosclerotic plaque without significant stenosis, occlusion, aneurysm or dissection. 2. No findings concerning for caval, iliac or femoral DVT. 3.  Aortic Atherosclerosis (ICD10-I70.0). NON-VASCULAR 1. Small bilateral pleural effusions. 2. Multifocal bilateral nephrolithiasis without evidence of obstruction. 3. Colonic diverticular disease without CT evidence of active inflammation. 4. Surgical changes of prior L4-L5 posterior lumbar interbody fusion with interbody graft. 5. Surgical changes of right long-stem unipolar hip arthroplasty prosthesis. Electronically Signed   By: Jacqulynn Cadet M.D.   On: 03/26/2021 15:41   ECHOCARDIOGRAM COMPLETE  Result Date: 03/27/2021    ECHOCARDIOGRAM REPORT   Patient Name:   Bruce Ball Date of Exam: 03/27/2021 Medical Rec #:  [id number]       Height:       71.0 in Accession #:    [id number]      Weight:       246.3 lb Date of Birth:  1946/11/12       BSA:          2.303 m Patient Age:    38 years        BP:           134/60 mmHg Patient Gender: M  HR:           75 bpm. Exam Location:  Inpatient Procedure: 2D Echo, 3D Echo, Cardiac Doppler and Color Doppler Indications:    I50.40* Unspecified combined systolic (congestive) and diastolic                 (congestive) heart failure  History:        Patient has prior history of Echocardiogram examinations, most                 recent 01/14/2020. CHF, Signs/Symptoms:Shortness of Breath and                 Dyspnea; Risk Factors:Dyslipidemia and Hypertension.  Sonographer:    Roseanna Rainbow RDCS Referring Phys: [id number] [facility]  Sonographer Comments: Technically difficult study due to poor echo windows. Image acquisition challenging due to patient body habitus. IMPRESSIONS  1. Intracavitary gradient. Peak velocity 2.07 m/s. Peak  gradient 17 mmHg. Left ventricular ejection fraction, by estimation, is >75%. Left ventricular ejection fraction by 2D MOD biplane is 76.9 %. Left ventricular ejection fraction by PLAX is 76 %. The  left ventricle has hyperdynamic function. The left ventricle has no regional wall motion abnormalities. There is moderate concentric left ventricular hypertrophy. Left ventricular diastolic parameters are consistent with Grade I diastolic dysfunction (impaired relaxation).  2. Right ventricular systolic function is normal. The right ventricular size is normal. There is normal pulmonary artery systolic pressure.  3. The mitral valve is normal in structure. Trivial mitral valve regurgitation. No evidence of mitral stenosis.  4. The aortic valve is calcified. There is mild calcification of the aortic valve. There is mild thickening of the aortic valve. Aortic valve regurgitation is not visualized. Mild aortic valve sclerosis is present, with no evidence of aortic valve stenosis.  5. Aortic dilatation noted. There is mild dilatation of the ascending aorta, measuring 39 mm.  6. The inferior vena cava is normal in size with greater than 50% respiratory variability, suggesting right atrial pressure of 3 mmHg. FINDINGS  Left Ventricle: Intracavitary gradient. Peak velocity 2.07 m/s. Peak gradient 17 mmHg. Left ventricular ejection fraction, by estimation, is >75%. Left ventricular ejection fraction by PLAX is 76 %. Left ventricular ejection fraction by 2D MOD biplane is 76.9 %. The left ventricle has hyperdynamic function. The left ventricle has no regional wall motion abnormalities. The left ventricular internal cavity size was normal in size. There is moderate concentric left ventricular hypertrophy. Left ventricular diastolic parameters are consistent with Grade I diastolic dysfunction (impaired relaxation). Normal left ventricular filling pressure. Right Ventricle: The right ventricular size is normal. No increase in right  ventricular wall thickness. Right ventricular systolic function is normal. There is normal pulmonary artery systolic pressure. The tricuspid regurgitant velocity is 2.10 m/s, and  with an assumed right atrial pressure of 3 mmHg, the estimated right ventricular systolic pressure is 03.7 mmHg. Left Atrium: Left atrial size was normal in size. Right Atrium: Right atrial size was normal in size. Pericardium: Trivial pericardial effusion is present. Mitral Valve: The mitral valve is normal in structure. Trivial mitral valve regurgitation. No evidence of mitral valve stenosis. Tricuspid Valve: The tricuspid valve is normal in structure. Tricuspid valve regurgitation is trivial. No evidence of tricuspid stenosis. Aortic Valve: The aortic valve is calcified. There is mild calcification of the aortic valve. There is mild thickening of the aortic valve. Aortic valve regurgitation is not visualized. Mild aortic valve sclerosis is present, with no evidence of aortic valve  stenosis. Pulmonic Valve: The pulmonic valve was normal in structure. Pulmonic valve regurgitation is not visualized. No evidence of pulmonic stenosis. Aorta: Aortic dilatation noted. There is mild dilatation of the ascending aorta, measuring 39 mm. Venous: The inferior vena cava is normal in size with greater than 50% respiratory variability, suggesting right atrial pressure of 3 mmHg. IAS/Shunts: No atrial level shunt detected by color flow Doppler.  LEFT VENTRICLE PLAX 2D                        Biplane EF (MOD) LV EF:         Left            LV Biplane EF:   Left                ventricular                      ventricular                ejection                         ejection                fraction by                      fraction by                PLAX is 76                       2D MOD                %.                               biplane is LVIDd:         4.30 cm                          76.9 %. LVIDs:         2.40 cm LV PW:         1.40 cm          Diastology LV IVS:        1.40 cm         LV e' medial:    7.18 cm/s LVOT diam:     2.00 cm         LV E/e' medial:  9.1 LV SV:         58              LV e' lateral:   5.44 cm/s LV SV Index:   25              LV E/e' lateral: 12.0 LVOT Area:     3.14 cm  LV Volumes (MOD) LV vol d, MOD    57.7 ml A2C: LV vol d, MOD    36.6 ml A4C: LV vol s, MOD    14.9 ml A2C: LV vol s, MOD    7.8 ml A4C: LV SV MOD A2C:   42.8 ml LV SV MOD A4C:   36.6 ml LV SV MOD BP:    38.0 ml RIGHT VENTRICLE  IVC RV S prime:     18.60 cm/s  IVC diam: 2.10 cm TAPSE (M-mode): 2.0 cm LEFT ATRIUM             Index       RIGHT ATRIUM           Index LA diam:        2.90 cm 1.26 cm/m  RA Area:     10.80 cm LA Vol (A2C):   25.1 ml 10.90 ml/m RA Volume:   21.30 ml  9.25 ml/m LA Vol (A4C):   45.2 ml 19.62 ml/m LA Biplane Vol: 35.7 ml 15.50 ml/m  AORTIC VALVE LVOT Vmax:   103.00 cm/s LVOT Vmean:  71.200 cm/s LVOT VTI:    0.184 m  AORTA Ao Root diam: 3.60 cm Ao Asc diam:  3.90 cm MITRAL VALVE               TRICUSPID VALVE MV Area (PHT): 2.76 cm    TR Peak grad:   17.6 mmHg MV Decel Time: 275 msec    TR Vmax:        210.00 cm/s MV E velocity: 65.50 cm/s MV A velocity: 88.10 cm/s  SHUNTS MV E/A ratio:  0.74        Systemic VTI:  0.18 m                            Systemic Diam: 2.00 cm Skeet Latch MD Electronically signed by Skeet Latch MD Signature Date/Time: 03/27/2021/10:45:30 AM    Final    VAS [name] LOWER EXTREMITY VENOUS (DVT)  Result Date: 03/27/2021  Lower Venous DVT Study Indications: SOB and Edema (CHF exacerbation & CKD3).  Risk Factors: HX of PE - 2021. Limitations: Poor ultrasound/tissue interface and body habitus. Comparison Study: Previous exam 10/16/20 - Negative Performing Technologist: Rogelia Rohrer  Examination Guidelines: A complete evaluation includes B-mode imaging, spectral Doppler, color Doppler, and power Doppler as needed of all accessible portions of each vessel. Bilateral testing is considered an integral  part of a complete examination. Limited examinations for reoccurring indications may be performed as noted. The reflux portion of the exam is performed with the patient in reverse Trendelenburg.  +---------+---------------+---------+-----------+----------+-------------------+ RIGHT    CompressibilityPhasicitySpontaneityPropertiesThrombus Aging      +---------+---------------+---------+-----------+----------+-------------------+ CFV      Full           Yes      Yes                                      +---------+---------------+---------+-----------+----------+-------------------+ SFJ      Full                                                             +---------+---------------+---------+-----------+----------+-------------------+ FV Prox  Full           Yes      Yes                                      +---------+---------------+---------+-----------+----------+-------------------+ FV Mid   Full  Yes      Yes                                      +---------+---------------+---------+-----------+----------+-------------------+ FV DistalFull           Yes      Yes                                      +---------+---------------+---------+-----------+----------+-------------------+ PFV      Full                                                             +---------+---------------+---------+-----------+----------+-------------------+ POP      Full           Yes      Yes                                      +---------+---------------+---------+-----------+----------+-------------------+ PTV      Full                                         Not well visualized +---------+---------------+---------+-----------+----------+-------------------+ PERO     Full                                         Not well visualized +---------+---------------+---------+-----------+----------+-------------------+    +---------+---------------+---------+-----------+----------+------------------+ LEFT     CompressibilityPhasicitySpontaneityPropertiesThrombus Aging     +---------+---------------+---------+-----------+----------+------------------+ CFV      Full           Yes      Yes                                     +---------+---------------+---------+-----------+----------+------------------+ SFJ      Full                                                            +---------+---------------+---------+-----------+----------+------------------+ FV Prox  Full           Yes      Yes                                     +---------+---------------+---------+-----------+----------+------------------+ FV Mid   Full           Yes      Yes                                     +---------+---------------+---------+-----------+----------+------------------+ FV DistalFull  Yes      Yes                                     +---------+---------------+---------+-----------+----------+------------------+ PFV      Full                                                            +---------+---------------+---------+-----------+----------+------------------+ POP      Full           Yes      Yes                                     +---------+---------------+---------+-----------+----------+------------------+ PTV      Full                                                            +---------+---------------+---------+-----------+----------+------------------+ PERO                                                  Not seen on this                                                         exam               +---------+---------------+---------+-----------+----------+------------------+   Left Technical Findings: Not visualized segments include Peroneal veins.   Summary: BILATERAL: - No evidence of deep vein thrombosis seen in the lower extremities, bilaterally. - No  evidence of superficial venous thrombosis in the lower extremities, bilaterally. -No evidence of popliteal cyst, bilaterally.   *See table(s) above for measurements and observations. Electronically signed by Curt Jews MD on 03/27/2021 at 9:44:26 PM.    Final       Subjective: States that his leg swellings have resolved.  No dyspnea or chest pain.  Reported some weakness and had not been out of bed much but subsequently evaluated by PT.  Intermittent right upper back localized spasms.  Discharge Exam:  Vitals:   03/28/21 1450 03/28/21 2116 03/29/21 0500 03/29/21 0521  BP: (!) 163/75 139/73  (!) 144/75  Pulse: 79 78  74  Resp: (!) 22 16  16   Temp: 99.4 F (37.4 C) 99.2 F (37.3 C)  98.9 F (37.2 C)  TempSrc: Oral     SpO2: 95% 97%  94%  Weight:   110.6 kg   Height:          General exam: Elderly male, moderately built and obese lying comfortably propped up in bed without distress. Respiratory system: Clear to auscultation.  No increased work of breathing. Cardiovascular system: S1 & S2 heard, RRR. No JVD, murmurs, rubs, gallops  or clicks.  No pedal edema.  Telemetry personally reviewed: Sinus rhythm. Gastrointestinal system: Abdomen is nondistended, soft and nontender. No organomegaly or masses felt. Normal bowel sounds heard. Central nervous system: Alert and oriented. No focal neurological deficits. Extremities: Symmetric 5 x 5 power. Skin: No rashes, lesions or ulcers Psychiatry: Judgement and insight appear normal. Mood & affect appropriate.     The results of significant diagnostics from this hospitalization (including imaging, microbiology, ancillary and laboratory) are listed below for reference.     Microbiology: Recent Results (from the past 240 hour(s))  Resp Panel by RT-PCR (Flu A&B, Covid) Nasopharyngeal Swab     Status: None   Collection Time: 03/26/21  1:40 PM   Specimen: Nasopharyngeal Swab; Nasopharyngeal(NP) swabs in vial transport medium  Result Value  Ref Range Status   SARS Coronavirus 2 by RT PCR NEGATIVE NEGATIVE Final    Comment: (NOTE) SARS-CoV-2 target nucleic acids are NOT DETECTED.  The SARS-CoV-2 RNA is generally detectable in upper respiratory specimens during the acute phase of infection. The lowest concentration of SARS-CoV-2 viral copies this assay can detect is 138 copies/mL. A negative result does not preclude SARS-Cov-2 infection and should not be used as the sole basis for treatment or other patient management decisions. A negative result may occur with  improper specimen collection/handling, submission of specimen other than nasopharyngeal swab, presence of viral mutation(s) within the areas targeted by this assay, and inadequate number of viral copies(<138 copies/mL). A negative result must be combined with clinical observations, patient history, and epidemiological information. The expected result is Negative.  Fact Sheet for Patients:  EntrepreneurPulse.com.au  Fact Sheet for Healthcare Providers:  IncredibleEmployment.be  This test is no t yet approved or cleared by the [place] FDA and  has been authorized for detection and/or diagnosis of SARS-CoV-2 by FDA under an Emergency Use Authorization (EUA). This EUA will remain  in effect (meaning this test can be used) for the duration of the COVID-19 declaration under Section 564(b)(1) of the Act, 21 U.S.C.section 360bbb-3(b)(1), unless the authorization is terminated  or revoked sooner.       Influenza A by PCR NEGATIVE NEGATIVE Final   Influenza B by PCR NEGATIVE NEGATIVE Final    Comment: (NOTE) The Xpert Xpress SARS-CoV-2/FLU/RSV plus assay is intended as an aid in the diagnosis of influenza from Nasopharyngeal swab specimens and should not be used as a sole basis for treatment. Nasal washings and aspirates are unacceptable for Xpert Xpress SARS-CoV-2/FLU/RSV testing.  Fact Sheet for  Patients: EntrepreneurPulse.com.au  Fact Sheet for Healthcare Providers: IncredibleEmployment.be  This test is not yet approved or cleared by the [place] FDA and has been authorized for detection and/or diagnosis of SARS-CoV-2 by FDA under an Emergency Use Authorization (EUA). This EUA will remain in effect (meaning this test can be used) for the duration of the COVID-19 declaration under Section 564(b)(1) of the Act, 21 U.S.C. section 360bbb-3(b)(1), unless the authorization is terminated or revoked.  Performed at [facility]      Labs: CBC: Recent Labs  Lab 03/26/21 1230 03/26/21 1235 03/26/21 1313 03/27/21 0008 03/28/21 0529  WBC 8.4 8.5  --  10.2 9.5  NEUTROABS 6.2  --   --   --  6.1  HGB 11.1* 11.1* 10.9* 11.0* 11.0*  HCT 36.5* 36.4* 32.0* 36.2* 36.0*  MCV 77.0* 77.3*  --  77.2* 78.3*  PLT 252 242  --  232 902    Basic Metabolic Panel: Recent Labs  Lab  03/26/21 1230 03/26/21 1313 03/27/21 0008 03/28/21 0529 03/29/21 0447  NA 144 143 141 138 137  K 4.0 4.0 3.8 3.2* 3.2*  CL 110  --  106 99 99  CO2 25  --  25 28 26   GLUCOSE 86  --  109* 100* 90  BUN 17  --  18 19 19   CREATININE 1.37*  --  1.41* 1.87* 1.53*  CALCIUM 8.1*  --  8.4* 8.4* 8.4*  MG 2.0  --   --   --  2.0  PHOS 2.5  --   --   --   --     Liver Function Tests: Recent Labs  Lab 03/26/21 1230  AST 21  ALT 16  ALKPHOS 51  BILITOT 0.7  PROT 5.7*  ALBUMIN 3.5    Urinalysis    Component Value Date/Time   COLORURINE YELLOW 03/26/2021 1359   APPEARANCEUR CLEAR 03/26/2021 1359   LABSPEC 1.022 03/26/2021 1359   PHURINE 6.0 03/26/2021 1359   GLUCOSEU NEGATIVE 03/26/2021 1359   HGBUR MODERATE (A) 03/26/2021 1359   HGBUR large 02/26/2010 1214   BILIRUBINUR NEGATIVE 03/26/2021 1359   BILIRUBINUR 1+ 06/26/2016 1117   KETONESUR NEGATIVE 03/26/2021 1359   PROTEINUR TRACE (A) 03/26/2021 1359   UROBILINOGEN 1.0 06/26/2016 1117    UROBILINOGEN 1.0 03/05/2010 2252   NITRITE NEGATIVE 03/26/2021 1359   LEUKOCYTESUR NEGATIVE 03/26/2021 1359    I discussed in detail with patient spouse at bedside, updated care and answered all questions.  Time coordinating discharge: 40 minutes  SIGNED:  Vernell Leep, MD, [place], [facility]. [facility]  To contact the attending provider between 7A-7P or the covering provider during after hours 7P-7A, please log into the web site [email or web address] and access using universal [place] password for that web site. If you do not have the password, please call the hospital operator.

## 2021-03-29 NOTE — Chronic Care Management (AMB) (Signed)
  Chronic Care Management   Outreach Note  03/29/2021 Name: TRYTON BODI MRN: [id number] DOB: 03/27/1946  Referred by: [name], MD Reason for referral : No chief complaint on file.   An unsuccessful telephone outreach was attempted today. The patient was referred to the pharmacist for assistance with care management and care coordination.   Follow Up Plan:   Lauretta Grill Upstream Scheduler

## 2021-03-30 ENCOUNTER — Telehealth: Payer: Self-pay

## 2021-03-30 NOTE — Telephone Encounter (Signed)
how about the seventh at 73 40 - only potential issue would be labs as would be late in the morning but if he wouldn't mind coming back if needed that could work

## 2021-03-30 NOTE — Telephone Encounter (Signed)
See below

## 2021-03-30 NOTE — Telephone Encounter (Signed)
Patient was in [facility] from 3/28-3/31 and needs a hosp follow up and we are full for the next week is there any where we could work patient in or any ideas we can do to get patient seen  Please advise  Thank you

## 2021-04-02 ENCOUNTER — Telehealth: Payer: Self-pay

## 2021-04-02 NOTE — Telephone Encounter (Signed)
Noted thanks- can we make sure these visits are listed as TCM in the notes so we know to bill as such?

## 2021-04-02 NOTE — Telephone Encounter (Signed)
See below

## 2021-04-02 NOTE — Telephone Encounter (Cosign Needed)
Transition Care Management Follow-up Telephone Call  Date of discharge and from where: 03/29/21 [facility]   How have you been since you were released from the hospital? ok  Any questions or concerns? No  Items Reviewed:  Did the pt receive and understand the discharge instructions provided? Yes   Medications obtained and verified? No will review on appt tomorrow   Other? No   Any new allergies since your discharge? No   Dietary orders reviewed? No  Do you have support at home? Yes   Home Care and Equipment/Supplies: Were home health services ordered? not applicable If so, what is the name of the agency?   Has the agency set up a time to come to the patient's home? not applicable Were any new equipment or medical supplies ordered?  No What is the name of the medical supply agency?  Were you able to get the supplies/equipment? not applicable Do you have any questions related to the use of the equipment or supplies? No  Functional Questionnaire: (I = Independent and D = Dependent) ADLs: I  Bathing/Dressing- I  Meal Prep- I  Eating- I  Maintaining continence- I  Transferring/Ambulation- I  Managing Meds- I  Follow up appointments reviewed:   PCP Hospital f/u appt confirmed? Yes  Scheduled to see Dr hunter  on 04/03/21 @ 4:20.  [facility] f/u appt confirmed? Yes  Scheduled to see Ermalinda Barrios on 04/25/21 @ 1:45.  Are transportation arrangements needed? No   If their condition worsens, is the pt aware to call PCP or go to the Emergency Dept.? Yes  Was the patient provided with contact information for the PCP's office or ED? Yes  Was to pt encouraged to call back with questions or concerns? Yes

## 2021-04-03 ENCOUNTER — Encounter: Payer: Self-pay | Admitting: Family Medicine

## 2021-04-03 ENCOUNTER — Ambulatory Visit (INDEPENDENT_AMBULATORY_CARE_PROVIDER_SITE_OTHER): Payer: Medicare Other | Admitting: Family Medicine

## 2021-04-03 ENCOUNTER — Other Ambulatory Visit: Payer: Self-pay

## 2021-04-03 VITALS — BP 128/77 | HR 71 | Temp 98.0°F | Ht 71.0 in | Wt 240.6 lb

## 2021-04-03 DIAGNOSIS — I5033 Acute on chronic diastolic (congestive) heart failure: Secondary | ICD-10-CM | POA: Diagnosis not present

## 2021-04-03 DIAGNOSIS — E785 Hyperlipidemia, unspecified: Secondary | ICD-10-CM | POA: Diagnosis not present

## 2021-04-03 DIAGNOSIS — N1831 Chronic kidney disease, stage 3a: Secondary | ICD-10-CM

## 2021-04-03 DIAGNOSIS — I2510 Atherosclerotic heart disease of native coronary artery without angina pectoris: Secondary | ICD-10-CM | POA: Diagnosis not present

## 2021-04-03 DIAGNOSIS — M450 Ankylosing spondylitis of multiple sites in spine: Secondary | ICD-10-CM

## 2021-04-03 DIAGNOSIS — R269 Unspecified abnormalities of gait and mobility: Secondary | ICD-10-CM | POA: Diagnosis not present

## 2021-04-03 DIAGNOSIS — I5032 Chronic diastolic (congestive) heart failure: Secondary | ICD-10-CM | POA: Diagnosis not present

## 2021-04-03 DIAGNOSIS — R3129 Other microscopic hematuria: Secondary | ICD-10-CM

## 2021-04-03 DIAGNOSIS — I2699 Other pulmonary embolism without acute cor pulmonale: Secondary | ICD-10-CM

## 2021-04-03 MED ORDER — POTASSIUM CHLORIDE ER 10 MEQ PO TBCR: 10.0000 meq | EXTENDED_RELEASE_TABLET | Freq: Every day | ORAL | 0 refills | Status: DC | PRN

## 2021-04-03 MED ORDER — FUROSEMIDE 20 MG PO TABS: 20.0000 mg | ORAL_TABLET | Freq: Every day | ORAL | 3 refills | Status: DC | PRN

## 2021-04-03 MED ORDER — TIZANIDINE HCL 2 MG PO CAPS: 2.0000 mg | ORAL_CAPSULE | Freq: Three times a day (TID) | ORAL | 0 refills | Status: DC | PRN

## 2021-04-03 NOTE — Patient Instructions (Addendum)
Health Maintenance Due  Topic Date Due  . Please complete your cologuard Never done   We will call you within two weeks about your referral to physical therapy. If you do not hear within 2 weeks, give [name] a call.   Submit your info to nutritionist and let us know if they need anything else  Thanks for doing labs today  Recommended follow up: Return in about 3 months (around 07/03/2021) for follow up- or sooner if needed.

## 2021-04-03 NOTE — Progress Notes (Signed)
Phone [phone]   Subjective:  Bruce Ball is a 75 y.o. year old very pleasant male patient who presents for transitional care management and hospital follow up for CHF exacerbation. Patient was hospitalized from 03/26/21 to 03/29/21. A TCM phone call was completed on 4/4/422. Medical complexity moderate   75 year old male with multiple medical problems including hypertension, CAD, CKD stage III, degenerative disc disease with chronic back pain, pulmonary embolism in January 2021 on chronic Eliquis who presented to the hospital with 2 weeks of progressive bilateral lower extremity swelling, abdominal swelling and shortness of breath after 2400 mile road trip ultimately admitted for diastolic CHF and hypertensive urgency  For diastolic CHF-thought to be precipitated by hypertensive urgency along with high salt diet during travel.  Patient was diuresed with IV Lasix and lost 17 pounds during admission.  Creatinine was trending up during hospitalization and discontinued on 03/28/2021.  Cardiology was consulted and they did not recommend restarting Lasix as he is already on indapamide for kidney stones.  Instead they stressed low-sodium diet and adequate blood pressure control.  A repeat CT angiogram was performed and negative for PE.  Patient was determined to need outpatient cardiology follow-up including potential coronary calcium scoring.  He was continued on carvedilol.  Imdur was discontinued. -Echocardiogram showed grade 1 diastolic dysfunction and ejection fraction of 76%.  Mild calcification of the aortic valve as well as sclerosis but no stenosis -weight down another 3 lbs since hospitalization -weighing daily at home- largely stable. This morning was around 239  For chronic kidney disease stage III with acute on chronic kidney injury-it was thought chronic kidney disease likely related to poorly controlled hypertension.  Acute kidney injury during hospitalization from aggressive diuresis.   Baseline creatinine around 1.4 but worsened to 1.8 before IV Lasix was discontinued with improvement in creatinine after that.  There was consideration for outpatient nephrology consultation-we discussed this today and ultimately did not decide to refer.  Patient knows to avoid NSAIDs.  Hospitalist recommended outpatient BMP follow-up which has been ordered today  For hypertension/hypertensive urgency-patient with improved control during hospitalization with diuresis.  He was continued on carvedilol.  Imdur was discontinued.  Low-salt diet was recommended.  Recommended close outpatient follow-up BP Readings from Last 3 Encounters:  04/03/21 128/77  03/29/21 (!) 144/75  03/26/21 (!) 188/83   For CAD-only noted on CT angiogram-no history of known CAD requiring stent or bypass.  Patient without anginal symptoms.  Troponin trend was negative for concern for ischemic event.  Plan on outpatient basis was for coronary calcium score.  Patient was continued on Eliquis alone and not on aspirin.For hyperlipidemia-patient was continued on his baseline atorvastatin with LDL goal under 70 for CAD-he has been below this in the pas Lab Results  Component Value Date   CHOL 104 11/30/2019   HDL 56 11/30/2019   LDLCALC 38 11/30/2019   LDLDIRECT 145.4 06/27/2011   TRIG 49 11/30/2019   CHOLHDL 4.5 05/18/2016     For history of DVT/PE in 2021-patient has been converted from 5 mg twice daily of Eliquis to 2.5 mg twice daily and out of abundance of precaution with shortness of breath CT angiogram was performed which was negative for PE.  Lower extremity venous Dopplers performed and negative for DVT as well.  Patient was continued on low-dose Eliquis  For degenerative disc disease-patient treated with as needed Flexeril-became particularly uncomfortable with lying down for CT scan.  Unfortunately Flexeril made him very tired.  Tylenol, heat pack  and topical pain patches were recommended.  PT evaluated did not recommend  any home therapy.significant BP elevation on CT  Microscopic hematuria-unclear etiology was reported.  They recommended repeat urine microscopic palpation-stopped since related to kidney stones- sees Dr. Tresa [name]- -patient did have 11-20 RBCs per high-power field  I independently reviewed chest x-ray on 03/26/2021-normal airway, arthritic thoracic spine, normal cardiac silhouette, diaphragm largely normal-small potential bilateral pleural effusion, airways with some interstitial prominence but not overly impressive-calcified left upper lobe granuloma and left hilar lymph nodes .  See problem oriented charting as well  Past Medical History-  Patient Active Problem List   Diagnosis Date Noted  . CAD (coronary artery disease) 04/03/2021    Priority: High  . Diastolic CHF ([place]) 00/93/8182    Priority: High  . Aortic atherosclerosis ([place]) 05/17/2020    Priority: High  . Idiopathic neuropathy 01/28/2020    Priority: High  . Recurrent falls 01/28/2020    Priority: High  . Pulmonary emboli ([place]) 01/14/2020    Priority: High  . High risk medication use 02/28/2017    Priority: High  . Ataxia 05/18/2016    Priority: High  . Vestibular disequilibrium 05/18/2016    Priority: High  . Ankylosing spondylitis ([place]) 05/24/2014    Priority: High  . B12 deficiency 05/17/2020    Priority: Medium  . Iliac artery aneurysm ([place]) 05/17/2020    Priority: Medium  . Osteoporosis 04/04/2020    Priority: Medium  . Chronic kidney disease (CKD), stage III (moderate) ([place]) 01/19/2020    Priority: Medium  . GERD (gastroesophageal reflux disease) 12/22/2019    Priority: Medium  . Essential tremor 12/22/2019    Priority: Medium  . Hyperlipidemia 11/11/2017    Priority: Medium  . Right ureteral stone 11/05/2017    Priority: Medium  . History of prostate cancer 03/08/2017    Priority: Medium  . Hx of chondrosarcoma 02/28/2017    Priority: Medium  . Peripheral edema 06/08/2012    Priority: Medium  .  Extrinsic asthma 04/07/2009    Priority: Medium  . LOW BACK PAIN 04/21/2008    Priority: Medium  . History of peptic ulcer disease 04/21/2008    Priority: Medium  . Essential hypertension 10/29/2007    Priority: Medium  . Chronic anticoagulation 03/26/2021    Priority: Low  . Acquired coagulation disorder ([place]) 12/12/2020    Priority: Low  . Senile purpura ([place]) 12/12/2020    Priority: Low  . Femoral condyle fracture ([place]) 07/15/2019    Priority: Low  . Community acquired pneumonia of right upper lobe of lung 11/05/2017    Priority: Low  . History of total right hip replacement 03/08/2017    Priority: Low  . DDD (degenerative disc disease), thoracic 02/28/2017    Priority: Low  . Lung nodule, solitary 05/24/2014    Priority: Low  . Shortness of breath 03/26/2021  . Anemia in chronic kidney disease 01/25/2021  . Thin skin 07/25/2020  . Gross hematuria 06/14/2019    Medications- reviewed and updated  A medical reconciliation was performed comparing current medicines to hospital discharge medications. Current Outpatient Medications  Medication Sig Dispense Refill  . acetaminophen (TYLENOL) 325 MG tablet Take 2 tablets (650 mg total) by mouth every 6 (six) hours as needed for mild pain or moderate pain.    Marland Kitchen apixaban (ELIQUIS) 2.5 MG TABS tablet Take 1 tablet (2.5 mg total) by mouth 2 (two) times daily.    Marland Kitchen atorvastatin (LIPITOR) 20 MG tablet Take 20 mg at bedtime  by mouth.     . carvedilol (COREG) 25 MG tablet Take 25 mg by mouth 2 (two) times daily.    . Cholecalciferol (VITAMIN D3) 50 MCG (2000 UT) capsule Take 2,000 Units by mouth in the morning and at bedtime.     . indapamide (LOZOL) 1.25 MG tablet Take 1.25 mg by mouth daily.    . metoCLOPramide (REGLAN) 5 MG tablet TAKE 1 TABLET (5 MG TOTAL) BY MOUTH 3 (THREE) TIMES DAILY BEFORE MEALS. 90 tablet 1  . predniSONE (DELTASONE) 5 MG tablet Take 5 mg by mouth daily with breakfast.     No current facility-administered  medications for this visit.   Objective  Objective:  BP 128/77   Pulse 71   Temp 98 F (36.7 C) (Temporal)   Ht 5\' 11"  (1.803 m)   Wt 240 lb 9.6 oz (109.1 kg)   SpO2 96%   BMI 33.56 kg/m  Gen: NAD, resting comfortably, well appearing considering significance of recent hospitalization CV: RRR no murmurs rubs or gallops Lungs: CTAB no crackles, wheeze, rhonchi Abdomen: soft/nontender/nondistended/normal bowel sounds. Ext: trace edema Skin: warm, dry    Assessment and Plan:   #TCM for new onset CHF  #Diastolic CHF-patient with aggressive diuresis in the hospital and down at least 17 pounds.  New baseline weight at home appears to be 239 and 240 in the office.  Patient is on indapamide and cardiology did not recommend regular Lasix.  Patient's family requests having Lasix available if he has weight gain over the weekend or increased swelling-we opted to prescribe and use only if had weight gain of 3 in a day or 5 pounds in a week or if had significant edema or shortness of breath.  He agrees to weigh daily.  To reduce risk of recurrent hospitalization they would like a referral to nutrition to help with low-salt diet-this was placed today  #Chronic kidney disease stage III-considering patient is not going need ongoing Lasix and filtration rate is above 45-I think we can monitor without referral to nephrology at this point.  Update CMP with labs today.  Baseline creatinine around 1.4 and he was at 1.53 upon discharge-hopefully stable  #Microscopic hematuria-11-20 per high-power field in the hospital but history of extensive kidney stones.  We intended to repeat this today but I did discharge patient without directing the lab again (original blood draw in the room).  CC chart sent to my team to address bring him back for microscopic urine evaluation.  Can refer back to Dr. Tresa [name] if needed of urology-may simply reach out for his opinion  #Coronary artery disease on imaging-patient has planned  CT cardiac scoring upcoming.  Encouraged him to complete this.  He states due to his back issues not sure he can lay on the table-we prescribed a low-dose of tizanidine for him to use-did not tolerate other muscle relaxer in the hospital-wife will be close with him.  Also advised heating pad before going in for CT.  We will continue Eliquis alone.  Also has upcoming cardiology visit. -For hyperlipidemia we will try to add lipid panel-for now continue atorvastatin  #Chronic DVT/PE-continue chronic Eliquis 2.5 mg twice daily as recommended by hematology oncology  #For degenerative disc disease/low back pain/gait imbalance-patient with chronic gait imbalance and also intermittent issues of back pain-requests referral back to physical therapy and this was placed today -Patient has listed diagnosis of ankylosing spondylitis but apparently this has been questionable in the past depending on provider involved -remains on  prednisone  Recommended follow up: 10-month follow-up or sooner if needed Future Appointments  Date Time Provider [place]  04/09/2021  2:00 PM [facility] CCM PHARMACIST [facility]  04/25/2021  1:45 PM Imogene Burn, PA-C [facility]  05/03/2021 11:00 AM [facility]    Lab/Order associations:   ICD-10-CM   1. Acute on chronic diastolic congestive heart failure (HCC)  I50.33 CBC with Differential/Platelet    Comprehensive metabolic panel  2. Microscopic hematuria  R31.29 Urine Microscopic    CANCELED: Urine Microscopic  3. Chronic diastolic congestive heart failure (HCC)  I50.32 Amb ref to Medical Nutrition Therapy-MNT  4. Hyperlipidemia, unspecified hyperlipidemia type  E78.5 Lipid panel  5. Stage 3a chronic kidney disease (HCC)  N18.31   6. Coronary artery disease involving native coronary artery of native heart without angina pectoris  I25.10   7. Other acute pulmonary embolism without acute cor pulmonale (HCC)  I26.99   8. Ankylosing  spondylitis of multiple sites in spine ([place])  M45.0   9. Gait abnormality  R26.9 Ambulatory referral to Physical Therapy    Meds ordered this encounter  Medications  . tizanidine (ZANAFLEX) 2 MG capsule    Sig: Take 1 capsule (2 mg total) by mouth 3 (three) times daily as needed for muscle spasms (take 1 prior to CT scan for muscle spasms).    Dispense:  5 capsule    Refill:  0  . furosemide (LASIX) 20 MG tablet    Sig: Take 1 tablet (20 mg total) by mouth daily as needed (weight increase 5 lbs in a week or 3 lbs in a day, increased edema, or increased shortness of breath).    Dispense:  30 tablet    Refill:  3  . potassium chloride (KLOR-CON) 10 MEQ tablet    Sig: Take 1 tablet (10 mEq total) by mouth daily as needed (if you take lasix).    Dispense:  30 tablet    Refill:  0   Time Spent: 64 minutes of total time (4:26 PM 5:30 PM) was spent on the date of the encounter performing the following actions: chart review prior to seeing the patient, obtaining history, performing a medically necessary exam, counseling on the treatment plan, placing orders, and documenting in our EHR.    Return precautions advised.  [name], MD

## 2021-04-03 NOTE — Assessment & Plan Note (Signed)
#  Coronary artery disease on imaging-patient has planned CT cardiac scoring upcoming.  Encouraged him to complete this.  He states due to his back issues not sure he can lay on the table-we prescribed a low-dose of tizanidine for him to use-did not tolerate other muscle relaxer in the hospital-wife will be close with him.  Also advised heating pad before going in for CT.  We will continue Eliquis alone.  Also has upcoming cardiology visit. -For hyperlipidemia we will try to add lipid panel-for now continue atorvastatin

## 2021-04-03 NOTE — Assessment & Plan Note (Signed)
#  Chronic kidney disease stage III-considering patient is not going need ongoing Lasix and filtration rate is above 45-I think we can monitor without referral to nephrology at this point.  Update CMP with labs today.  Baseline creatinine around 1.4 and he was at 1.53 upon discharge-hopefully stable

## 2021-04-03 NOTE — Assessment & Plan Note (Signed)
#  Diastolic CHF-patient with aggressive diuresis in the hospital and down at least 17 pounds.  New baseline weight at home appears to be 239 and 240 in the office.  Patient is on indapamide and cardiology did not recommend regular Lasix.  Patient's family requests having Lasix available if he has weight gain over the weekend or increased swelling-we opted to prescribe and use only if had weight gain of 3 in a day or 5 pounds in a week or if had significant edema or shortness of breath.  He agrees to weigh daily.  To reduce risk of recurrent hospitalization they would like a referral to nutrition to help with low-salt diet-this was placed today

## 2021-04-04 DIAGNOSIS — Z85828 Personal history of other malignant neoplasm of skin: Secondary | ICD-10-CM | POA: Diagnosis not present

## 2021-04-04 DIAGNOSIS — C4441 Basal cell carcinoma of skin of scalp and neck: Secondary | ICD-10-CM | POA: Diagnosis not present

## 2021-04-04 DIAGNOSIS — C44311 Basal cell carcinoma of skin of nose: Secondary | ICD-10-CM | POA: Diagnosis not present

## 2021-04-04 LAB — CBC WITH DIFFERENTIAL/PLATELET
Basophils Absolute: 0.1 10*3/uL (ref 0.0–0.1)
Basophils Relative: 0.9 % (ref 0.0–3.0)
Eosinophils Absolute: 0.1 10*3/uL (ref 0.0–0.7)
Eosinophils Relative: 1.5 % (ref 0.0–5.0)
HCT: 36.9 % — ABNORMAL LOW (ref 39.0–52.0)
Hemoglobin: 11.8 g/dL — ABNORMAL LOW (ref 13.0–17.0)
Lymphocytes Relative: 21.2 % (ref 12.0–46.0)
Lymphs Abs: 1.9 10*3/uL (ref 0.7–4.0)
MCHC: 31.9 g/dL (ref 30.0–36.0)
MCV: 74 fl — ABNORMAL LOW (ref 78.0–100.0)
Monocytes Absolute: 0.7 10*3/uL (ref 0.1–1.0)
Monocytes Relative: 8.5 % (ref 3.0–12.0)
Neutro Abs: 6 10*3/uL (ref 1.4–7.7)
Neutrophils Relative %: 67.9 % (ref 43.0–77.0)
Platelets: 282 10*3/uL (ref 150.0–400.0)
RBC: 4.99 Mil/uL (ref 4.22–5.81)
RDW: 17.2 % — ABNORMAL HIGH (ref 11.5–15.5)
WBC: 8.8 10*3/uL (ref 4.0–10.5)

## 2021-04-04 LAB — LIPID PANEL
Cholesterol: 106 mg/dL (ref 0–200)
HDL: 32.9 mg/dL — ABNORMAL LOW (ref >39.00)
LDL Cholesterol: 54 mg/dL (ref 0–99)
NonHDL: 73.1
Total CHOL/HDL Ratio: 3
Triglycerides: 96 mg/dL (ref 0.0–149.0)
VLDL: 19.2 mg/dL (ref 0.0–40.0)

## 2021-04-04 LAB — COMPREHENSIVE METABOLIC PANEL
ALT: 17 U/L (ref 0–53)
AST: 21 U/L (ref 0–37)
Albumin: 3.6 g/dL (ref 3.5–5.2)
Alkaline Phosphatase: 59 U/L (ref 39–117)
BUN: 22 mg/dL (ref 6–23)
CO2: 29 mEq/L (ref 19–32)
Calcium: 9 mg/dL (ref 8.4–10.5)
Chloride: 104 mEq/L (ref 96–112)
Creatinine, Ser: 1.67 mg/dL — ABNORMAL HIGH (ref 0.40–1.50)
GFR: 39.99 mL/min — ABNORMAL LOW (ref >60.00)
Glucose, Bld: 108 mg/dL — ABNORMAL HIGH (ref 70–99)
Potassium: 4.2 mEq/L (ref 3.5–5.1)
Sodium: 140 mEq/L (ref 135–145)
Total Bilirubin: 0.6 mg/dL (ref 0.2–1.2)
Total Protein: 6.5 g/dL (ref 6.0–8.3)

## 2021-04-05 NOTE — Progress Notes (Signed)
Chronic Care Management Pharmacy Note  04/09/2021 Name:  Bruce Ball MRN:  [id number] DOB:  1946-04-18  Subjective: Bruce Ball is an 75 y.o. year old male who is a primary patient of Hunter, Brayton Mars, MD.  The CCM team was consulted for assistance with disease management and care coordination needs.    Engaged with patient face to face for initial visit in response to provider referral for pharmacy case management and/or care coordination services.   Consent to Services:  The patient was given the following information about Chronic Care Management services today, agreed to services, and gave verbal consent: 1. CCM service includes personalized support from designated clinical staff supervised by the primary care provider, including individualized plan of care and coordination with other care providers 2. 24/7 contact phone numbers for assistance for urgent and routine care needs. 3. Service will only be billed when office clinical staff spend 20 minutes or more in a month to coordinate care. 4. Only one practitioner may furnish and bill the service in a calendar month. 5.The patient may stop CCM services at any time (effective at the end of the month) by phone call to the office staff. 6. The patient will be responsible for cost sharing (co-pay) of up to 20% of the service fee (after annual deductible is met). Patient agreed to services and consent obtained.  Patient Care Team: [name], MD as PCP - General (Family Medicine) Pa, [facility] as Consulting Physician Jovita Gamma, MD as Consulting Physician (Neurosurgery) Clinic, [facility] as Consulting Physician [name], MD as Consulting Physician (Urology) Madelin Rear, [facility] as Pharmacist (Pharmacist)   Recent office visits:  04/03/2021 OV PCP [name], MD; Patient's family requests having Lasix available if he has weight gain over the weekend or increased swelling-we opted to  prescribe and use only if had weight gain of 3 in a day or 5 pounds in a week or if had significant edema or shortness of breath. - Tizanidine for back pain - Referral to PT for chronic gait imbalance. Monitor kidney function at next OV and reconsider nephrology referral as CKD III now.   Recent consult visits:  [place] [facility] visits:  Medication Reconciliation was completed by comparing discharge summary, patient's EMR and Pharmacy list, and upon discussion with patient.  Admitted to the hospital on 03/26/2021 due to CHF exacerbation. Discharge date was 03/29/2021. Discharged from [place]?Medications Started at [facility] Discharge:?? -started Potassium and Magnesium supplements due to hypokalemia.  Medications Discontinued at Hospital Discharge: -Stopped Imdur  Medications that remain the same after Hospital Discharge:??  -All other medications will remain the same.   Objective:  Lab Results  Component Value Date   CREATININE 1.67 (H) 04/03/2021   CREATININE 1.53 (H) 03/29/2021   CREATININE 1.87 (H) 03/28/2021    Lab Results  Component Value Date   HGBA1C 5.5 02/07/2020   Last diabetic Eye exam: No results found for: HMDIABEYEEXA  Last diabetic Foot exam: No results found for: HMDIABFOOTEX      Component Value Date/Time   CHOL 106 04/03/2021 1637   TRIG 96.0 04/03/2021 1637   HDL 32.90 (L) 04/03/2021 1637   CHOLHDL 3 04/03/2021 1637   VLDL 19.2 04/03/2021 1637   LDLCALC 54 04/03/2021 1637   LDLDIRECT 145.4 06/27/2011 1124    Hepatic Function Latest Ref Rng & Units 04/03/2021 03/26/2021 04/04/2020  Total Protein 6.0 - 8.3 g/dL 6.5 5.7(L) 5.9(L)  Albumin  3.5 - 5.2 g/dL 3.6 3.5 3.7  AST 0 - 37 U/L '21 21 20  ' ALT 0 - 53 U/L '17 16 21  ' Alk Phosphatase 39 - 117 U/L 59 51 67  Total Bilirubin 0.2 - 1.2 mg/dL 0.6 0.7 0.7  Bilirubin, Direct 0.1 - 0.5 mg/dL - - -    Lab Results  Component Value Date/Time   TSH 0.825 02/07/2020 03:09 PM   TSH  1.45 11/30/2019 12:00 AM   TSH 1.022 11/06/2017 05:21 AM   TSH 1.316 05/18/2016 10:50 AM   TSH 1.20 06/27/2011 11:24 AM    CBC Latest Ref Rng & Units 04/03/2021 03/28/2021 03/27/2021  WBC 4.0 - 10.5 K/uL 8.8 9.5 10.2  Hemoglobin 13.0 - 17.0 g/dL 11.8(L) 11.0(L) 11.0(L)  Hematocrit 39.0 - 52.0 % 36.9(L) 36.0(L) 36.2(L)  Platelets 150.0 - 400.0 K/uL 282.0 223 232    Lab Results  Component Value Date/Time   VD25OH 45.4 02/07/2020 03:09 PM    Clinical ASCVD:  The ASCVD Risk score Mikey Bussing DC Jr., et al., 2013) failed to calculate for the following reasons:   The valid total cholesterol range is 130 to 320 mg/dL    Social History   Tobacco Use  Smoking Status Former Smoker  . Packs/day: 1.00  . Years: 17.00  . Pack years: 17.00  . Start date: 1963  . Quit date: 12/30/1978  . Years since quitting: 42.3  Smokeless Tobacco Never Used   BP Readings from Last 3 Encounters:  04/03/21 128/77  03/29/21 (!) 144/75  03/26/21 (!) 188/83   Pulse Readings from Last 3 Encounters:  04/03/21 71  03/29/21 74  03/26/21 66   Wt Readings from Last 3 Encounters:  04/03/21 240 lb 9.6 oz (109.1 kg)  03/29/21 243 lb 13.3 oz (110.6 kg)  03/26/21 261 lb 3.2 oz (118.5 kg)   Assessment: Review of patient past medical history, allergies, medications, health status, including review of consultants reports, laboratory and other test data, was performed as part of comprehensive evaluation and provision of chronic care management services.   SDOH:  (Social Determinants of Health) assessments and interventions performed: Yes.   CCM Care Plan  No Known Allergies  Medications Reviewed Today    Reviewed by Madelin Rear, [facility] (Pharmacist) on 04/09/21 at 52  Med List Status: <None>  Medication Order Taking? Sig Documenting Provider Last Dose Status Informant  acetaminophen (TYLENOL) 325 MG tablet [id number] Yes Take 2 tablets (650 mg total) by mouth every 6 (six) hours as needed for mild pain or moderate  pain. Modena Jansky, MD Taking Active   apixaban (ELIQUIS) 2.5 MG TABS tablet [id number] Yes Take 1 tablet (2.5 mg total) by mouth 2 (two) times daily. [name], MD Taking Active Self  atorvastatin (LIPITOR) 20 MG tablet [id number] Yes Take 20 mg at bedtime by mouth.  [provider] Taking Active Self  carvedilol (COREG) 25 MG tablet [id number] Yes Take 25 mg by mouth 2 (two) times daily. [provider] Taking Active Self  Cholecalciferol (VITAMIN D3) 50 MCG (2000 UT) capsule [id number] Yes Take 2,000 Units by mouth in the morning and at bedtime.  [provider] Taking Active Self  furosemide (LASIX) 20 MG tablet [id number] Yes Take 1 tablet (20 mg total) by mouth daily as needed (weight increase 5 lbs in a week or 3 lbs in a day, increased edema, or increased shortness of breath). [name], MD Taking Active   indapamide (LOZOL) 1.25 MG tablet [id number] Yes Take 1.25  mg by mouth daily. [provider] Taking Active Self  metoCLOPramide (REGLAN) 5 MG tablet [id number] Yes TAKE 1 TABLET (5 MG TOTAL) BY MOUTH 3 (THREE) TIMES DAILY BEFORE MEALS. Yetta Flock, MD Taking Active Self  potassium chloride (KLOR-CON) 10 MEQ tablet [id number] Yes Take 1 tablet (10 mEq total) by mouth daily as needed (if you take lasix). [name], MD Taking Active   predniSONE (DELTASONE) 5 MG tablet [id number] Yes Take 2.5 mg by mouth daily with breakfast. Taking 2.5 mg every other day then stopping at end of April [provider] Taking Active Self  tizanidine (ZANAFLEX) 2 MG capsule [id number] Yes Take 1 capsule (2 mg total) by mouth 3 (three) times daily as needed for muscle spasms (take 1 prior to CT scan for muscle spasms). [name], MD Taking Active   Med List Note Burt Knack, Jaclyn Prime 03/26/21 2221): [facility] [place]           Patient Active Problem List   Diagnosis Date Noted  . CAD (coronary artery disease) 04/03/2021  .  Diastolic CHF ([place]) 48/27/0786  . Shortness of breath 03/26/2021  . Chronic anticoagulation 03/26/2021  . Anemia in chronic kidney disease 01/25/2021  . Acquired coagulation disorder ([place]) 12/12/2020  . Senile purpura ([place]) 12/12/2020  . Thin skin 07/25/2020  . Aortic atherosclerosis ([place]) 05/17/2020  . B12 deficiency 05/17/2020  . Iliac artery aneurysm ([place]) 05/17/2020  . Osteoporosis 04/04/2020  . Idiopathic neuropathy 01/28/2020  . Recurrent falls 01/28/2020  . Chronic kidney disease (CKD), stage III (moderate) ([place]) 01/19/2020  . Pulmonary emboli ([place]) 01/14/2020  . GERD (gastroesophageal reflux disease) 12/22/2019  . Essential tremor 12/22/2019  . Femoral condyle fracture ([place]) 07/15/2019  . Gross hematuria 06/14/2019  . Hyperlipidemia 11/11/2017  . Community acquired pneumonia of right upper lobe of lung 11/05/2017  . Right ureteral stone 11/05/2017  . History of total right hip replacement 03/08/2017  . History of prostate cancer 03/08/2017  . 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 High risk medication use 02/28/2017  . DDD (degenerative disc disease), thoracic 02/28/2017  . Ataxia 05/18/2016  . Vestibular disequilibrium 05/18/2016  . Ankylosing spondylitis ([place]) 05/24/2014  . Lung nodule, solitary 05/24/2014  . Peripheral edema 06/08/2012  . Extrinsic asthma 04/07/2009  . LOW BACK PAIN 04/21/2008  . History of peptic ulcer disease 04/21/2008  . Essential hypertension 10/29/2007    Immunization History  Administered Date(s) Administered  . Influenza Split 12/30/2013, 11/14/2015, 12/13/2015, 09/16/2018  . Influenza, High Dose Seasonal PF 11/06/2016, 10/01/2019, 10/14/2019, 10/02/2020  . Influenza-Unspecified 12/13/2015  . PFIZER(Purple Top)SARS-COV-2 Vaccination 01/21/2020, 02/11/2020, 10/02/2020  . Pneumococcal Conjugate-13 09/15/2012, 05/19/2014  . Pneumococcal-Unspecified 05/19/2014  . Td 03/23/2015  . Tdap 03/23/2015, 02/14/2016  . Zoster 11/19/2013     Conditions to be addressed/monitored: CHF, CAD, HTN, HLD, CKD Stage III and PE Hx  Care Plan : [place]  Updates made by Madelin Rear, [facility] since 04/09/2021 12:00 AM    Problem: CHF, CAD, HTN, HLD, CKD Stage III, PE Hx, GERD   Priority: High  Note:   CHF, CAD, HTN, HLD, CKD Stage III and PE Hx   Long-Range Goal: Disease Management   Start Date: 04/09/2021  Expected End Date: 04/09/2022  This Visit's Progress: On track  Priority: High  Note:   Current Barriers:  . maintenance of goal daily weights, BP, dietary changes  Pharmacist Clinical Goal(s):  Marland Kitchen Patient will achieve adherence to monitoring guidelines and medication adherence  to achieve therapeutic efficacy . contact provider office for questions/concerns as evidenced notation of same in electronic health record through collaboration with PharmD and provider.   Interventions: . 1:1 collaboration with [name], MD regarding development and update of comprehensive plan of care as evidenced by provider attestation and co-signature . Inter-disciplinary care team collaboration (see longitudinal plan of care) . Comprehensive medication review performed; medication list updated in electronic medical record . No changes to medications  Hypertension (BP goal <130/80) -Controlled -CKD III, no DM. GFR per  EHR - 40 (03/2021), [facility] - 40 (10/2020) -Also has essential tremor diagnosis -Current treatment: . Carvedilol 25 mg twice daily -Home BP most recently 138/74. 156/68 highest over past week.  -Current dietary habits: coffee, oatmeal. After seeing nutritionist, is going to try to eat 3-4 small meals.  Is expecting to have a meal plan sent to him. Has been following salt diet and reading labels -Current exercise habits: PT and water aerobics at the [facility] -Denies hypotensive/hypertensive symptoms -Educated on daily weights and keeping a record, home BP monitoring -Counseled to monitor BP at home a different times  daily, document, and provide log at future appointments -Counseled on diet and exercise extensively Recommended to continue current medication   Hyperlipidemia: (LDL goal < 70) -Controlled -CAD, HTN, aortic atherosclerosis  -Current treatment: . Atorvastatin 20 mg once daily -Educated on Cholesterol goals;  Benefits of statin for ASCVD risk reduction; -Recommended to continue current medication  CHF (Goal: manage symptoms and prevent exacerbations) -Not ideally controlled -Has been consistent with daily weights, noted 2lb weight gain at one point which has resolved. Has not needed to use furosemide.  -HF type: Diastolic -Current treatment: . Indapamide 1.25 mg once daily . Furosemide 20 mg as needed -Current dietary habits: reviewed in htn -Current exercise habits: reviewed in htn -Educated on Importance of weighing daily; if you gain more than 3 pounds in one day or 5 pounds in one week, take furosemide 20 mg + potassium 10 meq once daily  -Recommended to continue current medication  PE Hx on Eliquis  -getting AC through [place] -Hx of recurrent falls, ambulation has been ongoing issue. Currently set up to start PT at [facility] -PE negative for PE at most recent hospitalization 03/2021. -Current Regimen:  Eliquis 2.5 mg twice daily  -Reviewed signs of bleed with patient, no issues noted -Recommended to continue current medication   GERD -Improved over past 3-4 months  -Hx of peptic ulcer disease -In process of prednisone taper 2.5 mg every other day then stop once through April 2022.  -Spicy foods are a trigger -Current regimen:  Pantoprazole 40 mg twice daily  Pepcid 10 mg - one to two tablets as needed   Metoclopramide 5 mg three times daily before meals  -May consider deescalation of reflux regimen following completion of prednisone taper.  Patient Goals/Self-Care Activities . Patient will:  - take medications as prescribed engage in dietary modifications by following low  salt diet and nutrition plan sent by nutritionist  Follow Up Plan: 1 month telephone call - review htn, weights, any medication needs Medication Assistance: None required.  Patient affirms current coverage meets needs.  Patient's preferred pharmacy is: Most medications ordered through [place]  Follow Up:  Patient agrees to Care Plan and Follow-up.    Future Appointments  Date Time Provider [place]  04/25/2021  1:45 PM Imogene Burn, PA-C [facility]  05/03/2021 11:00 AM [facility] 1 [facility]-CT [facility]  05/07/2021  3:30 PM  [facility] CCM PHARMACIST [facility]  07/12/2021  4:20 PM Hunter, Brayton Mars, MD [facility]   Madelin Rear, Pharm.D., BCGP Clinical Pharmacist [place] [phone]

## 2021-04-06 ENCOUNTER — Telehealth: Payer: Self-pay

## 2021-04-06 NOTE — Chronic Care Management (AMB) (Signed)
Chronic Care Management Pharmacy Assistant   Name: Bruce Ball  MRN: [id number] DOB: April 14, 1946  Bruce Ball is an 75 y.o. year old male who presents for his initial CCM visit with the clinical pharmacist.  Reason for Encounter: Chart Review for Initial CCM Visit   Conditions to be addressed/monitored: CHF, CAD, AA, Pulmonary emboli, HTN, HLD, CKD Stage III, Essential tremor, GERD, Osteoporosis, DDD, Ankylosing spondylitis, neuropathy and Pulmonary Disease    Recent office visits:  04/03/2021 OV PCP Bruce Reddish, MD; Patient's family requests having Lasix available if he has weight gain over the weekend or increased swelling-we opted to prescribe and use only if had weight gain of 3 in a day or 5 pounds in a week or if had significant edema or shortness of breath. - Tizanidine for back pain - Referral to PT for chronic gait imbalance  Recent consult visits:  [facility] visits:  Medication Reconciliation was completed by comparing discharge summary, patient's EMR and Pharmacy list, and upon discussion with patient.  Admitted to the hospital on 03/26/2021 due to CHF exacerbation. Discharge date was 03/29/2021. Discharged from [place]?Medications Started at [facility] Discharge:?? -started Potassium and Magnesium supplements due to hypokalemia.  Medications Discontinued at Hospital Discharge: -Stopped Imdur  Medications that remain the same after Hospital Discharge:??  -All other medications will remain the same.    Medications: Outpatient Encounter Medications as of 04/06/2021  Medication Sig  . acetaminophen (TYLENOL) 325 MG tablet Take 2 tablets (650 mg total) by mouth every 6 (six) hours as needed for mild pain or moderate pain.  Marland Kitchen apixaban (ELIQUIS) 2.5 MG TABS tablet Take 1 tablet (2.5 mg total) by mouth 2 (two) times daily.  Marland Kitchen atorvastatin (LIPITOR) 20 MG tablet Take 20 mg at bedtime by mouth.   . carvedilol (COREG) 25 MG tablet  Take 25 mg by mouth 2 (two) times daily.  . Cholecalciferol (VITAMIN D3) 50 MCG (2000 UT) capsule Take 2,000 Units by mouth in the morning and at bedtime.   . furosemide (LASIX) 20 MG tablet Take 1 tablet (20 mg total) by mouth daily as needed (weight increase 5 lbs in a week or 3 lbs in a day, increased edema, or increased shortness of breath).  . indapamide (LOZOL) 1.25 MG tablet Take 1.25 mg by mouth daily.  . metoCLOPramide (REGLAN) 5 MG tablet TAKE 1 TABLET (5 MG TOTAL) BY MOUTH 3 (THREE) TIMES DAILY BEFORE MEALS.  Marland Kitchen potassium chloride (KLOR-CON) 10 MEQ tablet Take 1 tablet (10 mEq total) by mouth daily as needed (if you take lasix).  . predniSONE (DELTASONE) 5 MG tablet Take 5 mg by mouth daily with breakfast.  . tizanidine (ZANAFLEX) 2 MG capsule Take 1 capsule (2 mg total) by mouth 3 (three) times daily as needed for muscle spasms (take 1 prior to CT scan for muscle spasms).   No facility-administered encounter medications on file as of 04/06/2021.   Medication Fill History: Acetaminophen 325 mg - takes as needed  Apixaban (Eliquis) 2.5 mg last filled 01/16/2020 30 DS at [facility] - had filled with the last 90 days from the [place] Carvedilol 25 mg - had filled with the last 90 days from the [place] Vitamin D3 - had filled with the last 90 days from the [place] Furosemide 20 mg - reports recently filled from [facility] Indapamide (Lozol) 1.25 mg - had filled with the last 90 days from the [place] Metoclopramide (Reglan) 5 mg -  had filled with the last 90 days from the [place]  Potassium Chloride (Klor-Con) 10 meq - reports recently filled from [facility] Prednisone 5 mg - had filled with the last 90 days from the [place]  Tizanidine 2 mg last filled - reports recently filled from [facility]  Any changes in your medications or health? Patient states he has not had any major changes in his health.  Any side effects from any medications?  Patient states he does not currently have any side effects from any of his medications.  Do you have  any symptoms or problems not managed by your medications? Patient states he does not have any problems or symptoms that are not managed by his medications.  Any concerns about your health right now? Patient states he does not have any major concerns about his health at this time.  Has your provider asked that you check blood pressure, blood sugar, or follow special diet at home? Patient states he monitors his blood pressure but not his blood sugars. Patient states he recently saw a dietician and will soon start a low sodium diet.  Do you get any type of exercise on a regular basis? Patient states he goes to [facility] daily for "water walking."  Can you think of a goal you would like to reach for your health? Patient states he would like to be healthy.  Do you have any problems getting your medications? Patient states he does not have any problems getting his medications.  Is there anything that you would like to discuss during the appointment?  Patient states he can't think of anything he would like to discuss other than his current medications.  Please bring medications and supplements to appointment  Star Rating Drugs: Atorvastatin 20 mg reports last filled at [facility] within the last 90 days  April D Calhoun, [place] Pharmacist Assistant [phone]

## 2021-04-09 ENCOUNTER — Other Ambulatory Visit: Payer: Self-pay

## 2021-04-09 ENCOUNTER — Ambulatory Visit (INDEPENDENT_AMBULATORY_CARE_PROVIDER_SITE_OTHER): Payer: Medicare Other

## 2021-04-09 DIAGNOSIS — I10 Essential (primary) hypertension: Secondary | ICD-10-CM | POA: Diagnosis not present

## 2021-04-09 DIAGNOSIS — E785 Hyperlipidemia, unspecified: Secondary | ICD-10-CM

## 2021-04-09 DIAGNOSIS — Z7901 Long term (current) use of anticoagulants: Secondary | ICD-10-CM

## 2021-04-09 DIAGNOSIS — I5032 Chronic diastolic (congestive) heart failure: Secondary | ICD-10-CM

## 2021-04-09 NOTE — Patient Instructions (Addendum)
[name],  Thank you for talking with me today. I have included our care plan/goals in the following pages.   Please review and call me at [phone] with any questions.  Thanks! Bruce Ball, Pharm.D., BCGP Clinical Pharmacist [facility] [place] [phone] There are no care plans to display for this patient.    The patient was given the following information about Chronic Care Management services today, agreed to services, and gave verbal consent: 1. CCM service includes personalized support from designated clinical staff supervised by the primary care provider, including individualized plan of care and coordination with other care providers 2. 24/7 contact phone numbers for assistance for urgent and routine care needs. 3. Service will only be billed when office clinical staff spend 20 minutes or more in a month to coordinate care. 4. Only one practitioner may furnish and bill the service in a calendar month. 5.The patient may stop CCM services at any time (effective at the end of the month) by phone call to the office staff. 6. The patient will be responsible for cost sharing (co-pay) of up to 20% of the service fee (after annual deductible is met). Patient agreed to services and consent obtained.  The patient verbalized understanding of instructions provided today and agreed to receive a MyChart copy of patient instruction and/or educational materials. Telephone follow up appointment with pharmacy team member scheduled for: See next appointment with "Care Management Staff" under "What's Next" below.  Blood Pressure Record Sheet To take your blood pressure, you will need a blood pressure machine. You can buy a blood pressure machine (blood pressure monitor) at your clinic, drug store, or online. When choosing one, consider:  An automatic monitor that has an arm cuff.  A cuff that wraps snugly around your upper arm. You should be  able to fit only one finger between your arm and the cuff.  A device that stores blood pressure reading results.  Do not choose a monitor that measures your blood pressure from your wrist or finger. Follow your health care provider's instructions for how to take your blood pressure. To use this form:  Get one reading in the morning (a.m.) before you take any medicines.  Get one reading in the evening (p.m.) before supper.  Take at least 2 readings with each blood pressure check. This makes sure the results are correct. Wait 1-2 minutes between measurements.  Write down the results in the spaces on this form.  Repeat this once a week, or as told by your health care provider.  Make a follow-up appointment with your health care provider to discuss the results. Blood pressure log Date: _______________________  a.m. _____________________(1st reading) _____________________(2nd reading)  p.m. _____________________(1st reading) _____________________(2nd reading) Date: _______________________  a.m. _____________________(1st reading) _____________________(2nd reading)  p.m. _____________________(1st reading) _____________________(2nd reading) Date: _______________________  a.m. _____________________(1st reading) _____________________(2nd reading)  p.m. _____________________(1st reading) _____________________(2nd reading) Date: _______________________  a.m. _____________________(1st reading) _____________________(2nd reading)  p.m. _____________________(1st reading) _____________________(2nd reading) Date: _______________________  a.m. _____________________(1st reading) _____________________(2nd reading)  p.m. _____________________(1st reading) _____________________(2nd reading) This information is not intended to replace advice given to you by your health care provider. Make sure you discuss any questions you have with your health care provider. Document Revised: 04/05/2020  Document Reviewed: 04/05/2020 Elsevier Patient Education  2021 [employer].  Heart Failure Medicines  Heart failure is a condition in which the heart cannot pump enough blood through the body. This can cause shortness of  breath, coughing, fatigue, and confusion. Swelling in the feet, ankles, and legs is also common. Medicines for heart failure can strengthen the heart's ability to pump blood and decrease the work it has to do. There is no cure for heart failure. However, taking medicines as directed and living a healthy lifestyle can help you stay active, avoid problems, and live longer. Talk with your health care provider about all medicines that you are taking, how often you should take them, and possible side effects.  Tell a health care provider about:  Any allergies you have.  All medicines you are taking, including vitamins, herbs, eye drops, creams, and over-the-counter medicines.  Any blood disorders you have.  Any other medical conditions you have.  Whether you are pregnant or may be pregnant. Types of medicines The medicines that are prescribed for you will depend on your symptoms, the type of heart failure you have, and the cause of your heart failure. In most cases, you may need to take more than one medicine. Angiotensin-converting enzyme (ACE) inhibitors, angiotensin receptor blockers (ARBs), and angiotensin receptor neprilysin inhibitor (ARNIs)  These medicines help to dilate arteries and veins. This makes it easier for your heart to pump by lowering blood pressure and reducing the strain on your heart. These medicines can help to lessen the symptoms of heart failure.  Common ACE inhibitors include lisinopril and ramipril. Common ARBs include losartan and valsartan. Common ARNIs include sacubitril with valsartan. ? Only take one of these medicines. Do not combine ACE inhibitors, ARBs, or ARNIs. The risk of side effects increases if more than one of these medicines is taken  at the same time.  Side effects include dry cough, dizziness, low blood pressure, high potassium levels, and kidney problems. You may need regular checkups and blood tests to monitor how the medicine is working.  Do not take these medicines if you are pregnant or may become pregnant. They can cause serious birth defects.  In rare cases, these medicines can cause a serious side effect called angioedema. Symptoms include swelling of the tongue, lips, mouth, or throat, trouble breathing, and hoarseness. Stomach pain or swelling and vomiting can also happen. ? If you have any of these symptoms, stop taking the medicine and contact your health care provider right away. If you have had angioedema in the past, talk with your health care provider before starting one of these medicines. Beta-blockers  These medicines slow your heart rate. This helps to improve the heart's ability to pump and lessen its workload.  Common beta-blockers for heart failure are bisoprolol, carvedilol, and long-acting metoprolol (extended-release).  Some beta-blockers can worsen lung diseases that cause wheezing, such as asthma. Talk with your health care provider before taking a beta-blocker if you have a lung disease.  These medicines can hide the symptoms of low blood sugar (glucose), also called hypoglycemia. If you have diabetes, check your blood glucose carefully. If you have hypoglycemia, talk with your health care provider about adjusting your medicines.  This medicine may make you feel dizzy or light-headed at first. Do not drive or use machinery when you first start these medicines. Ask your health care provider when it is safe for you to do this.  Because these medicines slow your heart rate, it is important not to overdo it with exercise. Talk with your health care provider about what your target heart rate should be while you exercise.  Do not stop this medicine suddenly unless your health care provider says to do  this. Stopping this medicine suddenly can increase the risk of a heart attack or heart rhythm problems. If it is decided that stopping this medicine is right for you, your dose will be lowered slowly to prevent these side effects. Diuretics  Diuretics help the body get rid of excess sodium and water by increasing the need to urinate more often. This helps to lessen the amount of blood that the heart needs to pump. They also reduce fluid buildup in the lungs, ankles, and feet.  Common diuretics include furosemide, bumetanide, and hydrochlorothiazide. The amount of water or fluid removed from the body depends on which diuretic is used.  You may be asked to weigh yourself to determine if you have too much or too little fluid. Ask your health care provider what your weight should be and when to contact your health care provider if it changes. Also, ask about how much fluid you should be drinking each day.  These medicines can worsen problems with controlling urination (urinary incontinence). Talk to your health care provider about the best time of day to take this medicine if you have trouble with bladder control.  Side effects include dizziness, headaches, muscle cramps, and an upset stomach.  These medicines can cause dehydration. Symptoms include tiredness, increased thirst, confusion, and decreased urination. Contact your health care provider right away if you think you might be dehydrated.  These medicines can cause your electrolyte levels (magnesium and potassium) and kidney function to change. These levels will be monitored by your health care provider.  These medicines can make you more sensitive to the sun. Keep out of the sun. If you cannot avoid being in the sun, wear protective clothing and use sunscreen. Do not use sun lamps or tanning beds. Aldosterone antagonists  These medicines are a type of diuretic. They get rid of excess sodium and water from the body by increasing the need to  urinate. This helps to lessen the amount of blood that the heart needs to pump.  Common aldosterone antagonists include spironolactone and eplerenone.  Side effects include dizziness, low blood pressure, increased potassium levels, and kidney problems. You may need regular checkups and blood tests to monitor how this medicine is working.  These medicines can raise the amount of potassium in the blood. Your potassium levels will be monitored regularly by your health care provider.  Spironolactone and eplerenone may increase breast size in males and may cause breast tenderness in all patients. Digoxin  Digoxin helps the heart pump blood better. It also lowers your heart rate. It is usually added to other medicines if you are still having heart failure symptoms.  Your health care provider will monitor your digoxin levels with blood tests. Too much digoxin can cause serious problems, such as an irregular heart rhythm.  Early signs of digoxin toxicity include nausea, vomiting, loss of appetite, headaches, confusion, weakness, or vision changes, such as blurred vision or seeing more yellow or green than normal. Contact your health care provider right away if you have any of these signs. Vasodilators  Hydralazine and nitrates relax the blood vessels and lower blood pressure. This helps your heart pump blood better.  Hydralazine and nitrates are usually given with other medicines to control your heart failure symptoms. You may be given one or both of these medicines, depending on your symptoms.  Side effects include headaches, a flushed face or neck, dizziness, and low blood pressure. If channel blockers  Ivabradine is an If channel blocker. It lowers the  heart rate and decreases the heart's workload.  This medicine is usually given with beta-blockers if you have symptoms of heart failure and your heart rate is more than 70 beats per minute while taking a beta-blocker.  Side effects include high  blood pressure, brightness in your field of vision, slower than normal heart rate (bradycardia), and heart rhythm problems.  Grapefruit juice may increase the risk of side effects. It is recommended to avoid this.  Do not take this medicine if you are pregnant or may become pregnant. It can cause serious birth defects. Sodium-glucose cotransporter-2 inhibitors (SGLT-2 inhibitors)  SGLT-2 inhibitors are medicines used to treat type 2 diabetes. They have been shown to improve symptoms and quality of life in people with heart failure, even if they do not have diabetes. They also decrease the rate of hospitalizations and death.  Common SGLT-2 inhibitors include canagliflozin and dapagliflozin.  Side effects include an increase in urinary tract infections, genital fungal infections, increased urination, and kidney problems.  If you have diabetes, there is a small risk of hypoglycemia. There is also a small risk of diabetic ketoacidosis. This happens when your blood sugar is very high and usually requires treatment in the hospital. Summary  When you have heart failure, taking medicines as directed and living a healthy lifestyle can help you stay active, avoid problems, and live longer.  In most cases, you will need to take more than one medicine.  It is important to talk with your health care provider about how often you should take your medicines. Do not skip a dose or change your dosage.  Talk to your health care provider about possible side effects of these medicines. This information is not intended to replace advice given to you by your health care provider. Make sure you discuss any questions you have with your health care provider. Document Revised: 08/28/2020 Document Reviewed: 08/28/2020 Elsevier Patient Education  [employer].

## 2021-04-11 ENCOUNTER — Other Ambulatory Visit (INDEPENDENT_AMBULATORY_CARE_PROVIDER_SITE_OTHER): Payer: Medicare Other

## 2021-04-11 ENCOUNTER — Other Ambulatory Visit: Payer: Self-pay

## 2021-04-11 DIAGNOSIS — R3129 Other microscopic hematuria: Secondary | ICD-10-CM

## 2021-04-11 LAB — URINALYSIS, MICROSCOPIC ONLY

## 2021-04-11 NOTE — Progress Notes (Signed)
Called and spoke with pt and he will stop by and leave sample today.

## 2021-04-23 NOTE — Progress Notes (Unsigned)
Cardiology Office Note    Date:  04/23/2021   ID:  Bruce Ball [birth year], MRN [id number]   PCP:  [name], MD   [place]  Cardiologist:  No primary care provider on file. *** Advanced Practice Provider:  No care team member to display Electrophysiologist:  None   [id number]   No chief complaint on file.   History of Present Illness:  Bruce Ball is a 75 y.o. male with history of hypertension, PE on chronic anticoagulation with Eliquis.  Patient was seen by [name] in the hospital 03/28/2021 after developing lower extremity edema after 2400 mile road trip at the [place] and eating fast foods.  CT showed no PE but did have coronary artery calcification, echo hyperdynamic LVEF greater than 75% with moderate LVH and grade 1 DD trivial MR mild dilatation of the ascending aorta.  He was treated with Lasix 40 mg twice daily and diuresed 3.2 L and 16 pounds but creatinine bumped up.  Blood pressure was elevated and he was continued on carvedilol 25 mg twice daily.  Recommend outpatient referral to nephrology for suspected hypertensive renal disease.  Would benefit from ARB if renal approves.  Lasix was stopped because he is already on a diuretic for kidney stones.  Plan for outpatient calcium score.    Past Medical History:  Diagnosis Date  . Chronic back pain greater than 3 months duration   . Chronic leg pain   . Coronary artery calcification seen on CAT scan 03/27/2021  . Deafness in right ear   . Dyspnea    cause by PE  . Extrinsic asthma, unspecified 04/07/2009   PT DENIES  . GERD (gastroesophageal reflux disease)   . History of kidney stones   . HYPERTENSION 10/29/2007  . HYPOTENSION 08/13/2010  . LOW BACK PAIN 04/21/2008  . NEPHROLITHIASIS, HX OF 04/21/2008  . Pneumonia   . PUD, HX OF 04/21/2008  . Pulmonary emboli ([place]) 2021   noted on CT 12-2019  . Renal disorder     Past Surgical History:  Procedure Laterality Date  . BACK  SURGERY    . bone cancer  1970   rt femur removed and steel rod placed  . CYSTOSCOPY WITH RETROGRADE PYELOGRAM, URETEROSCOPY AND STENT PLACEMENT Right 11/06/2017   Procedure: CYSTOSCOPY WITH  STENT PLACEMENT RIGHT;  Surgeon: Raynelle Bring, MD;  Location: [facility];  Service: Urology;  Laterality: Right;  . CYSTOSCOPY WITH RETROGRADE PYELOGRAM, URETEROSCOPY AND STENT PLACEMENT Left 06/09/2019   Procedure: CYSTOSCOPY WITH RETROGRADE PYELOGRAM, URETEROSCOPY AND STENT PLACEMENT X2;  Surgeon: [name], MD;  Location: [facility];  Service: Urology;  Laterality: Left;  . CYSTOSCOPY WITH RETROGRADE PYELOGRAM, URETEROSCOPY AND STENT PLACEMENT Bilateral 03/01/2020   Procedure: CYSTOSCOPY WITH RETROGRADE PYELOGRAM, URETEROSCOPY AND STENT PLACEMENT;  Surgeon: [name], MD;  Location: [facility];  Service: Urology;  Laterality: Bilateral;  75 MINS  . CYSTOSCOPY/RETROGRADE/URETEROSCOPY     1 year ago at [place] in [place]  . CYSTOSCOPY/URETEROSCOPY/HOLMIUM LASER Right 11/24/2017   Procedure: CYSTOSCOPY/URETEROSCOPY/ RETROGRADE/STENT REMOVAL;  Surgeon: Raynelle Bring, MD;  Location: [facility];  Service: Urology;  Laterality: Right;  . HOLMIUM LASER APPLICATION Bilateral 5/0/5397   Procedure: HOLMIUM LASER APPLICATION;  Surgeon: [name], MD;  Location: [facility];  Service: Urology;  Laterality: Bilateral;  . prosatectomy      Current Medications: No outpatient medications have been marked as taking for the 04/25/21 encounter (Appointment) with Bruce Burn, PA-C.  Allergies:   Patient has no known allergies.   Social History   Socioeconomic History  . Marital status: Married    Spouse name: Not on file  . Number of children: 2  . Years of education: Not on file  . Highest education level: Not on file  Occupational History  . Occupation: Optometrist  Tobacco Use  . Smoking status: Former Smoker    Packs/day: 1.00    Years: 17.00    Pack years: 17.00    Start date: 1963    Quit date:  12/30/1978    Years since quitting: 42.3  . Smokeless tobacco: Never Used  Vaping Use  . Vaping Use: Never used  Substance and Sexual Activity  . Alcohol use: No  . Drug use: No  . Sexual activity: Not on file  Other Topics Concern  . Not on file  Social History Narrative  . Not on file   Social Determinants of Health   Financial Resource Strain: Not on file  Food Insecurity: No Food Insecurity  . Worried About Charity fundraiser in the Last Year: Never true  . Ran Out of Food in the Last Year: Never true  Transportation Needs: Not on file  Physical Activity: Not on file  Stress: Not on file  Social Connections: Not on file     Family History:  The patient's ***family history includes COPD in his father; Cancer in an other family member; Prostate cancer in his brother.   ROS:   Please see the history of present illness.    ROS All other systems reviewed and are negative.   PHYSICAL EXAM:   VS:  There were no vitals taken for this visit.  Physical Exam  GEN: Well nourished, well developed, in no acute distress  HEENT: normal  Neck: no JVD, carotid bruits, or masses Cardiac:RRR; no murmurs, rubs, or gallops  Respiratory:  clear to auscultation bilaterally, normal work of breathing GI: soft, nontender, nondistended, + BS Ext: without cyanosis, clubbing, or edema, Good distal pulses bilaterally MS: no deformity or atrophy  Skin: warm and dry, no rash Neuro:  Alert and Oriented x 3, Strength and sensation are intact Psych: euthymic mood, full affect  Wt Readings from Last 3 Encounters:  04/03/21 240 lb 9.6 oz (109.1 kg)  03/29/21 243 lb 13.3 oz (110.6 kg)  03/26/21 261 lb 3.2 oz (118.5 kg)      Studies/Labs Reviewed:   EKG:  EKG is*** ordered today.  The ekg ordered today demonstrates ***  Recent Labs: 03/26/2021: B Natriuretic Peptide 107.3 03/29/2021: Magnesium 2.0 04/03/2021: ALT 17; BUN 22; Creatinine, Ser 1.67; Hemoglobin 11.8; Platelets 282.0; Potassium 4.2;  Sodium 140   Lipid Panel    Component Value Date/Time   CHOL 106 04/03/2021 1637   TRIG 96.0 04/03/2021 1637   HDL 32.90 (L) 04/03/2021 1637   CHOLHDL 3 04/03/2021 1637   VLDL 19.2 04/03/2021 1637   LDLCALC 54 04/03/2021 1637   LDLDIRECT 145.4 06/27/2011 1124    Additional studies/ records that were reviewed today include:  ECHO 03/27/21 IMPRESSIONS     1. Intracavitary gradient. Peak velocity 2.07 m/s. Peak gradient 17 mmHg.  Left ventricular ejection fraction, by estimation, is >75%. Left  ventricular ejection fraction by 2D MOD biplane is 76.9 %. Left  ventricular ejection fraction by PLAX is 76 %. The   left ventricle has hyperdynamic function. The left ventricle has no  regional wall motion abnormalities. There is moderate concentric left  ventricular hypertrophy.  Left ventricular diastolic parameters are  consistent with Grade I diastolic dysfunction  (impaired relaxation).   2. Right ventricular systolic function is normal. The right ventricular  size is normal. There is normal pulmonary artery systolic pressure.   3. The mitral valve is normal in structure. Trivial mitral valve  regurgitation. No evidence of mitral stenosis.   4. The aortic valve is calcified. There is mild calcification of the  aortic valve. There is mild thickening of the aortic valve. Aortic valve  regurgitation is not visualized. Mild aortic valve sclerosis is present,  with no evidence of aortic valve  stenosis.   5. Aortic dilatation noted. There is mild dilatation of the ascending  aorta, measuring 39 mm.   6. The inferior vena cava is normal in size with greater than 50%  respiratory variability, suggesting right atrial pressure of 3 mmHg.   FINDINGS   Left Ventricle: Intracavitary gradient. Peak velocity 2.07 m/s. Peak  gradient 17 mmHg. Left ventricular ejection fraction, by estimation, is  >75%. Left ventricular ejection fraction by PLAX is 76 %. Left ventricular  ejection fraction by  2D MOD biplane  is 76.9 %. The left ventricle has hyperdynamic function. The left  ventricle has no regional wall motion abnormalities. The left ventricular  internal cavity size was normal in size. There is moderate concentric left  ventricular hypertrophy. Left  ventricular diastolic parameters are consistent with Grade I diastolic  dysfunction (impaired relaxation). Normal left ventricular filling  pressure.   Right Ventricle: The right ventricular size is normal. No increase in  right ventricular wall thickness. Right ventricular systolic function is  normal. There is normal pulmonary artery systolic pressure. The tricuspid  regurgitant velocity is 2.10 m/s, and   with an assumed right atrial pressure of 3 mmHg, the estimated right  ventricular systolic pressure is 40.9 mmHg.   Left Atrium: Left atrial size was normal in size.   Right Atrium: Right atrial size was normal in size.   Pericardium: Trivial pericardial effusion is present.   Mitral Valve: The mitral valve is normal in structure. Trivial mitral  valve regurgitation. No evidence of mitral valve stenosis.   Tricuspid Valve: The tricuspid valve is normal in structure. Tricuspid  valve regurgitation is trivial. No evidence of tricuspid stenosis.   Aortic Valve: The aortic valve is calcified. There is mild calcification  of the aortic valve. There is mild thickening of the aortic valve. Aortic  valve regurgitation is not visualized. Mild aortic valve sclerosis is  present, with no evidence of aortic  valve stenosis.   Pulmonic Valve: The pulmonic valve was normal in structure. Pulmonic valve  regurgitation is not visualized. No evidence of pulmonic stenosis.   Aorta: Aortic dilatation noted. There is mild dilatation of the ascending  aorta, measuring 39 mm.   Venous: The inferior vena cava is normal in size with greater than 50%  respiratory variability, suggesting right atrial pressure of 3 mmHg.   IAS/Shunts: No  atrial level shunt detected by color flow Doppler.      Echo 01/14/20 IMPRESSIONS     1. Left ventricular ejection fraction, by visual estimation, is 60 to  65%. The left ventricle has normal function. There is moderately increased  left ventricular hypertrophy.   2. Moderate asymmetric hypertrophy of the basal septum measuring 92mm  (posterior wall 44mm)   3. The mitral valve is normal in structure. No evidence of mitral valve  regurgitation.   4. The aortic valve is tricuspid. Aortic valve regurgitation  is trivial.  Mild aortic valve sclerosis without stenosis.   5. The tricuspid valve is grossly normal.   6. The pulmonic valve was not well visualized. Pulmonic valve  regurgitation is trivial.   7. Global right ventricle has normal systolic function.The right  ventricular size is normal.   8. Left atrial size was normal.   9. Right atrial size was normal.  10. TR signal is inadequate for assessing pulmonary artery systolic  pressure.      Risk Assessment/Calculations:   {Does this patient have ATRIAL FIBRILLATION?:[id number]}     ASSESSMENT:    1. Chronic diastolic CHF (congestive heart failure) ([place])   2. Essential hypertension   3. Aortic atherosclerosis ([place])   4. History of pulmonary embolus ([place])      PLAN:  In order of problems listed above:   Chronic diastolic CHF-specialized after a long road trip eating fast food.  Diuresed 3.2 L and 16 pounds but creatinine bumped up.  Was not sent home on Lasix.  Echo with hyperdynamic LVEF greater than 75% with moderate LVH and grade 1 DD.  Was given as needed Lasix by PCP  Hypertension blood pressure was elevated in the hospital.  Carvedilol was continued.  Recommend outpatient referral to nephrology to see if they would approve ARB for suspected hypertensive renal disease.  Coronary calcifications on CT.  Recommend outpatient calcium score  History of pulmonary embolus on chronic anticoagulation with  Eliquis   Shared Decision Making/Informed Consent   {Are you ordering a CV Procedure (e.g. stress test, cath, DCCV, TEE, etc)?   Press F2        :[id number]}    Medication Adjustments/Labs and Tests Ordered: Current medicines are reviewed at length with the patient today.  Concerns regarding medicines are outlined above.  Medication changes, Labs and Tests ordered today are listed in the Patient Instructions below. There are no Patient Instructions on file for this visit.   Sumner Boast, PA-C  04/23/2021 3:26 PM    [facility] [address], [place], [place]  [address] Phone: [phone]; Fax: [phone]

## 2021-04-25 ENCOUNTER — Encounter: Payer: Self-pay | Admitting: Physician Assistant

## 2021-04-25 ENCOUNTER — Other Ambulatory Visit: Payer: Self-pay

## 2021-04-25 ENCOUNTER — Ambulatory Visit (INDEPENDENT_AMBULATORY_CARE_PROVIDER_SITE_OTHER): Payer: Medicare Other | Admitting: Physician Assistant

## 2021-04-25 VITALS — BP 134/80 | HR 70 | Ht 71.0 in | Wt 245.0 lb

## 2021-04-25 DIAGNOSIS — I700 Atherosclerosis of aorta: Secondary | ICD-10-CM | POA: Diagnosis not present

## 2021-04-25 DIAGNOSIS — I5032 Chronic diastolic (congestive) heart failure: Secondary | ICD-10-CM | POA: Diagnosis not present

## 2021-04-25 DIAGNOSIS — I10 Essential (primary) hypertension: Secondary | ICD-10-CM | POA: Diagnosis not present

## 2021-04-25 DIAGNOSIS — N183 Chronic kidney disease, stage 3 unspecified: Secondary | ICD-10-CM | POA: Diagnosis not present

## 2021-04-25 DIAGNOSIS — I2510 Atherosclerotic heart disease of native coronary artery without angina pectoris: Secondary | ICD-10-CM

## 2021-04-25 DIAGNOSIS — Z86711 Personal history of pulmonary embolism: Secondary | ICD-10-CM | POA: Diagnosis not present

## 2021-04-25 NOTE — Patient Instructions (Addendum)
Medication Instructions:  Your physician recommends that you continue on your current medications as directed. Please refer to the Current Medication list given to you today.  *If you need a refill on your cardiac medications before your next appointment, please call your pharmacy*   Lab Work: None ordered   If you have labs (blood work) drawn today and your tests are completely normal, you will receive your results only by: Marland Kitchen MyChart Message (if you have MyChart) OR . A paper copy in the mail If you have any lab test that is abnormal or we need to change your treatment, we will call you to review the results.   Testing/Procedures: None ordered    Follow-Up: Follow up with Dr. Radford Pax as planned   Your physician has referred you to see Nephrology. Someone will contact you with a appointment   Other Instructions Two Gram Sodium Diet 2000 mg  What is Sodium? Sodium is a mineral found naturally in many foods. The most significant source of sodium in the diet is table salt, which is about 40% sodium.  Processed, convenience, and preserved foods also contain a large amount of sodium.  The body needs only 500 mg of sodium daily to function,  A normal diet provides more than enough sodium even if you do not use salt.  Why Limit Sodium? A build up of sodium in the body can cause thirst, increased blood pressure, shortness of breath, and water retention.  Decreasing sodium in the diet can reduce edema and risk of heart attack or stroke associated with high blood pressure.  Keep in mind that there are many other factors involved in these health problems.  Heredity, obesity, lack of exercise, cigarette smoking, stress and what you eat all play a role.  General Guidelines:  Do not add salt at the table or in cooking.  One teaspoon of salt contains over 2 grams of sodium.  Read food labels  Avoid processed and convenience foods  Ask your dietitian before eating any foods not dicussed in the  menu planning guidelines  Consult your physician if you wish to use a salt substitute or a sodium containing medication such as antacids.  Limit milk and milk products to 16 oz (2 cups) per day.  Shopping Hints:  READ LABELS!! "Dietetic" does not necessarily mean low sodium.  Salt and other sodium ingredients are often added to foods during processing.   Menu Planning Guidelines Food Group Choose More Often Avoid  Beverages (see also the milk group All fruit juices, low-sodium, salt-free vegetables juices, low-sodium carbonated beverages Regular vegetable or tomato juices, commercially softened water used for drinking or cooking  Breads and Cereals Enriched white, wheat, rye and pumpernickel bread, hard rolls and dinner rolls; muffins, cornbread and waffles; most dry cereals, cooked cereal without added salt; unsalted crackers and breadsticks; low sodium or homemade bread crumbs Bread, rolls and crackers with salted tops; quick breads; instant hot cereals; pancakes; commercial bread stuffing; self-rising flower and biscuit mixes; regular bread crumbs or cracker crumbs  Desserts and Sweets Desserts and sweets mad with mild should be within allowance Instant pudding mixes and cake mixes  Fats Butter or margarine; vegetable oils; unsalted salad dressings, regular salad dressings limited to 1 Tbs; light, sour and heavy cream Regular salad dressings containing bacon fat, bacon bits, and salt pork; snack dips made with instant soup mixes or processed cheese; salted nuts  Fruits Most fresh, frozen and canned fruits Fruits processed with salt or sodium-containing ingredient (some  dried fruits are processed with sodium sulfites        Vegetables Fresh, frozen vegetables and low- sodium canned vegetables Regular canned vegetables, sauerkraut, pickled vegetables, and others prepared in brine; frozen vegetables in sauces; vegetables seasoned with ham, bacon or salt pork  Condiments, Sauces,  Miscellaneous  Salt substitute with physician's approval; pepper, herbs, spices; vinegar, lemon or lime juice; hot pepper sauce; garlic powder, onion powder, low sodium soy sauce (1 Tbs.); low sodium condiments (ketchup, chili sauce, mustard) in limited amounts (1 tsp.) fresh ground horseradish; unsalted tortilla chips, pretzels, potato chips, popcorn, salsa (1/4 cup) Any seasoning made with salt including garlic salt, celery salt, onion salt, and seasoned salt; sea salt, rock salt, kosher salt; meat tenderizers; monosodium glutamate; mustard, regular soy sauce, barbecue, sauce, chili sauce, teriyaki sauce, steak sauce, Worcestershire sauce, and most flavored vinegars; canned gravy and mixes; regular condiments; salted snack foods, olives, picles, relish, horseradish sauce, catsup   Food preparation: Try these seasonings Meats:    Pork Sage, onion Serve with applesauce  Chicken Poultry seasoning, thyme, parsley Serve with cranberry sauce  Lamb Curry powder, rosemary, garlic, thyme Serve with mint sauce or jelly  Veal Marjoram, basil Serve with current jelly, cranberry sauce  Beef Pepper, bay leaf Serve with dry mustard, unsalted chive butter  Fish [place] leaf, dill Serve with unsalted lemon butter, unsalted parsley butter  Vegetables:    Asparagus Lemon juice   Broccoli Lemon juice   Carrots Mustard dressing parsley, mint, nutmeg, glazed with unsalted butter and sugar   Green beans Marjoram, lemon juice, nutmeg,dill seed   Tomatoes Basil, marjoram, onion   Spice /blend for [employer]" 4 tsp ground thyme 1 tsp ground sage 3 tsp ground rosemary 4 tsp ground marjoram   Test your knowledge 1. A product that says "Salt Free" may still contain sodium. True or False 2. Garlic Powder and Hot Pepper Sauce an be used as alternative seasonings.True or False 3. Processed foods have more sodium than fresh foods.  True or False 4. Canned Vegetables have less sodium than froze True or False  WAYS TO  DECREASE YOUR SODIUM INTAKE 1. Avoid the use of added salt in cooking and at the table.  Table salt (and other prepared seasonings which contain salt) is probably one of the greatest sources of sodium in the diet.  Unsalted foods can gain flavor from the sweet, sour, and butter taste sensations of herbs and spices.  Instead of using salt for seasoning, try the following seasonings with the foods listed.  Remember: how you use them to enhance natural food flavors is limited only by your creativity... Allspice-Meat, fish, eggs, fruit, peas, red and yellow vegetables Almond Extract-Fruit baked goods Anise Seed-Sweet breads, fruit, carrots, beets, cottage cheese, cookies (tastes like licorice) Basil-Meat, fish, eggs, vegetables, rice, vegetables salads, soups, sauces Bay Leaf-Meat, fish, stews, poultry Burnet-Salad, vegetables (cucumber-like flavor) Caraway Seed-Bread, cookies, cottage cheese, meat, vegetables, cheese, rice Cardamon-Baked goods, fruit, soups Celery Powder or seed-Salads, salad dressings, sauces, meatloaf, soup, bread.Do not use  celery salt Chervil-Meats, salads, fish, eggs, vegetables, cottage cheese (parsley-like flavor) Chili Power-Meatloaf, chicken cheese, corn, eggplant, egg dishes Chives-Salads cottage cheese, egg dishes, soups, vegetables, sauces Cilantro-Salsa, casseroles Cinnamon-Baked goods, fruit, pork, lamb, chicken, carrots Cloves-Fruit, baked goods, fish, pot roast, green beans, beets, carrots Coriander-Pastry, cookies, meat, salads, cheese (lemon-orange flavor) Cumin-Meatloaf, fish,cheese, eggs, cabbage,fruit pie (caraway flavor) [employer], fruit, eggs, fish, poultry, cottage cheese, vegetables Dill Seed-Meat, cottage cheese, poultry, vegetables, fish, salads, bread Fennel  Seed-Bread, cookies, apples, pork, eggs, fish, beets, cabbage, cheese, Licorice-like flavor Garlic-(buds or powder) Salads, meat, poultry, fish, bread, butter, vegetables, potatoes.Do not   use garlic salt Ginger-Fruit, vegetables, baked goods, meat, fish, poultry Horseradish Root-Meet, vegetables, butter Lemon Juice or Extract-Vegetables, fruit, tea, baked goods, fish salads Mace-Baked goods fruit, vegetables, fish, poultry (taste like nutmeg) Maple Extract-Syrups Marjoram-Meat, chicken, fish, vegetables, breads, green salads (taste like Sage) Mint-Tea, lamb, sherbet, vegetables, desserts, carrots, cabbage Mustard, Dry or Seed-Cheese, eggs, meats, vegetables, poultry Nutmeg-Baked goods, fruit, chicken, eggs, vegetables, desserts Onion Powder-Meat, fish, poultry, vegetables, cheese, eggs, bread, rice salads (Do not use   Onion salt) Orange Extract-Desserts, baked goods Oregano-Pasta, eggs, cheese, onions, pork, lamb, fish, chicken, vegetables, green salads Paprika-Meat, fish, poultry, eggs, cheese, vegetables Parsley Flakes-Butter, vegetables, meat fish, poultry, eggs, bread, salads (certain forms may   Contain sodium Pepper-Meat fish, poultry, vegetables, eggs Peppermint Extract-Desserts, baked goods Poppy Seed-Eggs, bread, cheese, fruit dressings, baked goods, noodles, vegetables, cottage  [employer], poultry, meat, fish, cauliflower, turnips,eggs bread Saffron-Rice, bread, veal, chicken, fish, eggs Sage-Meat, fish, poultry, onions, eggplant, tomateos, pork, stews Savory-Eggs, salads, poultry, meat, rice, vegetables, soups, pork Tarragon-Meat, poultry, fish, eggs, butter, vegetables (licorice-like flavor)  Thyme-Meat, poultry, fish, eggs, vegetables, (clover-like flavor), sauces, soups Tumeric-Salads, butter, eggs, fish, rice, vegetables (saffron-like flavor) Vanilla Extract-Baked goods, candy Vinegar-Salads, vegetables, meat marinades Walnut Extract-baked goods, candy  2. Choose your Foods Wisely   The following is a list of foods to avoid which are high in sodium:  Meats-Avoid all smoked, canned, salt cured, dried and  kosher meat and fish as well as Anchovies   [employer] meats:Bologna, Liverwurst, Pastrami Canned meat or fish  Marinated herring Caviar    Pepperoni Corned Beef   Pizza Dried chipped beef  Salami Frozen breaded fish or meat Salt pork Frankfurters or hot dogs  Sardines Gefilte fish   Sausage Ham (boiled ham, Proscuitto Smoked butt    spiced ham)   Spam      TV Dinners Vegetables Canned vegetables (Regular) Relish Canned mushrooms  Sauerkraut Olives    Tomato juice Pickles  Bakery and Dessert Products Canned puddings  Cream pies Cheesecake   Decorated cakes Cookies  Beverages/Juices Tomato juice, regular  Gatorade   V-8 vegetable juice, regular  Breads and Cereals Biscuit mixes   Salted potato chips, corn chips, pretzels Bread stuffing mixes  Salted crackers and rolls Pancake and waffle mixes Self-rising flour  Seasonings Accent    Meat sauces Barbecue sauce  Meat tenderizer Catsup    Monosodium glutamate (MSG) Celery salt   Onion salt Chili sauce   Prepared mustard Garlic salt   Salt, seasoned salt, sea salt Gravy mixes   Soy sauce Horseradish   Steak sauce Ketchup   Tartar sauce Lite salt    Teriyaki sauce Marinade mixes   Worcestershire sauce  Others Baking powder   Cocoa and cocoa mixes Baking soda   Commercial casserole mixes Candy-caramels, chocolate  Dehydrated soups    Bars, fudge,nougats  Instant rice and pasta mixes Canned broth or soup  Maraschino cherries Cheese, aged and processed cheese and cheese spreads  Learning Assessment Quiz  Indicated T (for True) or F (for False) for each of the following statements:  1. _____ Fresh fruits and vegetables and unprocessed grains are generally low in sodium 2. _____ Water may contain a considerable amount of sodium, depending on the source 3. _____ You can always tell if a food is high in  sodium by tasting it 4. _____ Certain laxatives my be high in sodium and should be avoided unless  prescribed   by a physician or pharmacist 5. _____ Salt substitutes may be used freely by anyone on a sodium restricted diet 6. _____ Sodium is present in table salt, food additives and as a natural component of   most foods 7. _____ Table salt is approximately 90% sodium 8. _____ Limiting sodium intake may help prevent excess fluid accumulation in the body 9. _____ On a sodium-restricted diet, seasonings such as bouillon soy sauce, and    cooking wine should be used in place of table salt 10. _____ On an ingredient list, a product which lists monosodium glutamate as the first   ingredient is an appropriate food to include on a low sodium diet  Circle the best answer(s) to the following statements (Hint: there may be more than one correct answer)  11. On a low-sodium diet, some acceptable snack items are:    A. Olives  F. Bean dip   K. Grapefruit juice    B. Salted Pretzels G. Commercial Popcorn   L. Canned peaches    C. Carrot Sticks  H. Bouillon   M. Unsalted nuts   D. [place] fries  I. Peanut butter crackers N. Salami   E. Sweet pickles J. Tomato Juice   O. Pizza  12.  Seasonings that may be used freely on a reduced - sodium diet include   A. Lemon wedges F.Monosodium glutamate K. Celery seed    B.Soysauce   G. Pepper   L. Mustard powder   C. Sea salt  H. Cooking wine  M. Onion flakes   D. Vinegar  E. Prepared horseradish N. Salsa   E. Sage   J. Worcestershire sauce  O. Chutney

## 2021-04-25 NOTE — Progress Notes (Signed)
Cardiology Office Note    Date:  04/25/2021   ID:  Bruce Ball, DOB 01/16/1946, MRN [id number]   PCP:  [name], MD   Pablo Pena  Cardiologist:  Fransico Him, MD  Advanced Practice Provider:  No care team member to display Electrophysiologist:  None   [id number]   Chief Complaint  Patient presents with  . Hospitalization Follow-up    History of Present Illness:  Bruce Ball is a 75 y.o. male with history of hypertension, PE on chronic anticoagulation with Eliquis.  Patient was seen by Bruce Ball in the hospital 03/28/2021 after developing lower extremity edema after 2400 mile road trip at the [facility] [place] and eating fast foods.  CT showed no PE but did have coronary artery calcification, echo hyperdynamic LVEF greater than 75% with moderate LVH and grade 1 DD trivial MR mild dilatation of the ascending aorta.  He was treated with Lasix 40 mg twice daily and diuresed 3.2 L and 16 pounds but creatinine bumped up.  Blood pressure was elevated and he was continued on carvedilol 25 mg twice daily.  Recommend outpatient referral to nephrology for suspected hypertensive renal disease.  Would benefit from ARB if renal approves.  Lasix was stopped because he is already on a diuretic for kidney stones.  Plan for outpatient calcium score.  Patient comes in for f/u.Wife on speaker phone. Feeling much better. Crt 1.67 04/03/21. Watching salt closely and working with a nutritionist. For calcium score May 5. Trying to get exercise walking in the pool.       Past Medical History:  Diagnosis Date  . Chronic back pain greater than 3 months duration   . Chronic leg pain   . Coronary artery calcification seen on CAT scan 03/27/2021  . Deafness in right ear   . Dyspnea    cause by PE  . Extrinsic asthma, unspecified 04/07/2009   PT DENIES  . GERD (gastroesophageal reflux disease)   . History of kidney stones   . HYPERTENSION 10/29/2007  . HYPOTENSION 08/13/2010  .  LOW BACK PAIN 04/21/2008  . NEPHROLITHIASIS, HX OF 04/21/2008  . Pneumonia   . PUD, HX OF 04/21/2008  . Pulmonary emboli ([place]) 2021   noted on CT 12-2019  . Renal disorder     Past Surgical History:  Procedure Laterality Date  . BACK SURGERY    . bone cancer  1970   rt femur removed and steel rod placed  . CYSTOSCOPY WITH RETROGRADE PYELOGRAM, URETEROSCOPY AND STENT PLACEMENT Right 11/06/2017   Procedure: CYSTOSCOPY WITH  STENT PLACEMENT RIGHT;  Surgeon: Bruce Bring, MD;  Location: [facility];  Service: Urology;  Laterality: Right;  . CYSTOSCOPY WITH RETROGRADE PYELOGRAM, URETEROSCOPY AND STENT PLACEMENT Left 06/09/2019   Procedure: CYSTOSCOPY WITH RETROGRADE PYELOGRAM, URETEROSCOPY AND STENT PLACEMENT X2;  Surgeon: Bruce Frock, MD;  Location: [facility];  Service: Urology;  Laterality: Left;  . CYSTOSCOPY WITH RETROGRADE PYELOGRAM, URETEROSCOPY AND STENT PLACEMENT Bilateral 03/01/2020   Procedure: CYSTOSCOPY WITH RETROGRADE PYELOGRAM, URETEROSCOPY AND STENT PLACEMENT;  Surgeon: Bruce Frock, MD;  Location: [facility];  Service: Urology;  Laterality: Bilateral;  75 MINS  . CYSTOSCOPY/RETROGRADE/URETEROSCOPY     1 year ago at [place] in [place]  . CYSTOSCOPY/URETEROSCOPY/HOLMIUM LASER Right 11/24/2017   Procedure: CYSTOSCOPY/URETEROSCOPY/ RETROGRADE/STENT REMOVAL;  Surgeon: Bruce Bring, MD;  Location: [facility];  Service: Urology;  Laterality: Right;  . HOLMIUM LASER APPLICATION Bilateral [id number]   Procedure: HOLMIUM LASER APPLICATION;  Surgeon: Bruce Frock, MD;  Location: [facility];  Service: Urology;  Laterality: Bilateral;  . prosatectomy      Current Medications: Current Meds  Medication Sig  . acetaminophen (TYLENOL) 325 MG tablet Take 2 tablets (650 mg total) by mouth every 6 (six) hours as needed for mild pain or moderate pain.  [facility] apixaban (ELIQUIS) 2.5 MG TABS tablet Take 1 tablet (2.5 mg total) by mouth 2 (two) times daily.  [facility]ruce [facility] atorvastatin (LIPITOR) 20 MG tablet Take 20 mg at  bedtime by mouth.   . carvedilol (COREG) 25 MG tablet Take 25 mg by mouth 2 (two) times daily.  . Cholecalciferol (VITAMIN D3) 50 MCG (2000 UT) capsule Take 2,000 Units by mouth in the morning and at bedtime.   . furosemide (LASIX) 20 MG tablet Take 1 tablet (20 mg total) by mouth daily as needed (weight increase 5 lbs in a week or 3 lbs in a day, increased edema, or increased shortness of breath).  . indapamide (LOZOL) 1.25 MG tablet Take 1.25 mg by mouth daily.  . metoCLOPramide (REGLAN) 5 MG tablet TAKE 1 TABLET (5 MG TOTAL) BY MOUTH 3 (THREE) TIMES DAILY BEFORE MEALS.  Bruce [facility] potassium chloride (KLOR-CON) 10 MEQ tablet Take 1 tablet (10 mEq total) by mouth daily as needed (if you take lasix).  . predniSONE (DELTASONE) 5 MG tablet Take 2.5 mg by mouth daily with breakfast. Taking 2.5 mg every other day then stopping at end of April  . tizanidine (ZANAFLEX) 2 MG capsule Take 1 capsule (2 mg total) by mouth 3 (three) times daily as needed for muscle spasms (take 1 prior to CT scan for muscle spasms).     Allergies:   Patient has no known allergies.   Social History   Socioeconomic History  . Marital status: Married    Spouse name: Not on file  . Number of children: 2  . Years of education: Not on file  . Highest education level: Not on file  Occupational History  . Occupation: Optometrist  Tobacco Use  . Smoking status: Former Smoker    Packs/day: 1.00    Years: 17.00    Pack years: 17.00    Start date: 1963    Quit date: 12/30/1978    Years since quitting: 42.3  . Smokeless tobacco: Never Used  Vaping Use  . Vaping Use: Never used  Substance and Sexual Activity  . Alcohol use: No  . Drug use: No  . Sexual activity: Not on file  Other Topics Concern  . Not on file  Social History Narrative  . Not on file   Social Determinants of Health   Financial Resource Strain: Not on file  Food Insecurity: No Food Insecurity  . Worried About Charity fundraiser in the Last Year: Never true   . Ran Out of Food in the Last Year: Never true  Transportation Needs: Not on file  Physical Activity: Not on file  Stress: Not on file  Social Connections: Not on file     Family History:  The patient's family history includes COPD in his father; Cancer in an other family member; Prostate cancer in his brother.   ROS:   Please see the history of present illness.    ROS All other systems reviewed and are negative.   PHYSICAL EXAM:   VS:  BP 134/80   Pulse 70   Ht 5\' 11"  (1.803 m)   Wt 245 lb (111.1 kg)   SpO2 97%   BMI 34.17 kg/m  Physical Exam  GEN:  Obese, in no acute distress  Neck: no JVD, carotid bruits, or masses Cardiac:RRR; no murmurs, rubs, or gallops  Respiratory:  clear to auscultation bilaterally, normal work of breathing GI: soft, nontender, nondistended, + BS Ext: trace ankle edema,without cyanosis, clubbing,  Good distal pulses bilaterally Neuro:  Alert and Oriented x 3, Psych: euthymic mood, full affect  Wt Readings from Last 3 Encounters:  04/25/21 245 lb (111.1 kg)  04/03/21 240 lb 9.6 oz (109.1 kg)  03/29/21 243 lb 13.3 oz (110.6 kg)      Studies/Labs Reviewed:   EKG:  EKG is not ordered today.   Recent Labs: 03/26/2021: B Natriuretic Peptide 107.3 03/29/2021: Magnesium 2.0 04/03/2021: ALT 17; BUN 22; Creatinine, Ser 1.67; Hemoglobin 11.8; Platelets 282.0; Potassium 4.2; Sodium 140   Lipid Panel    Component Value Date/Time   CHOL 106 04/03/2021 1637   TRIG 96.0 04/03/2021 1637   HDL 32.90 (L) 04/03/2021 1637   CHOLHDL 3 04/03/2021 1637   VLDL 19.2 04/03/2021 1637   LDLCALC 54 04/03/2021 1637   LDLDIRECT 145.4 06/27/2011 1124    Additional studies/ records that were reviewed today include:  ECHO 03/27/21 IMPRESSIONS     1. Intracavitary gradient. Peak velocity 2.07 m/s. Peak gradient 17 mmHg.  Left ventricular ejection fraction, by estimation, is >75%. Left  ventricular ejection fraction by 2D MOD biplane is 76.9 %. Left  ventricular  ejection fraction by PLAX is 76 %. The   left ventricle has hyperdynamic function. The left ventricle has no  regional wall motion abnormalities. There is moderate concentric left  ventricular hypertrophy. Left ventricular diastolic parameters are  consistent with Grade I diastolic dysfunction  (impaired relaxation).   2. Right ventricular systolic function is normal. The right ventricular  size is normal. There is normal pulmonary artery systolic pressure.   3. The mitral valve is normal in structure. Trivial mitral valve  regurgitation. No evidence of mitral stenosis.   4. The aortic valve is calcified. There is mild calcification of the  aortic valve. There is mild thickening of the aortic valve. Aortic valve  regurgitation is not visualized. Mild aortic valve sclerosis is present,  with no evidence of aortic valve  stenosis.   5. Aortic dilatation noted. There is mild dilatation of the ascending  aorta, measuring 39 mm.   6. The inferior vena cava is normal in size with greater than 50%  respiratory variability, suggesting right atrial pressure of 3 mmHg.   FINDINGS   Left Ventricle: Intracavitary gradient. Peak velocity 2.07 m/s. Peak  gradient 17 mmHg. Left ventricular ejection fraction, by estimation, is  >75%. Left ventricular ejection fraction by PLAX is 76 %. Left ventricular  ejection fraction by 2D MOD biplane  is 76.9 %. The left ventricle has hyperdynamic function. The left  ventricle has no regional wall motion abnormalities. The left ventricular  internal cavity size was normal in size. There is moderate concentric left  ventricular hypertrophy. Left  ventricular diastolic parameters are consistent with Grade I diastolic  dysfunction (impaired relaxation). Normal left ventricular filling  pressure.   Right Ventricle: The right ventricular size is normal. No increase in  right ventricular wall thickness. Right ventricular systolic function is  normal. There is normal  pulmonary artery systolic pressure. The tricuspid  regurgitant velocity is 2.10 m/s, and   with an assumed right atrial pressure of 3 mmHg, the estimated right  ventricular systolic pressure is 69.6 mmHg.   Left Atrium: Left atrial  size was normal in size.   Right Atrium: Right atrial size was normal in size.   Pericardium: Trivial pericardial effusion is present.   Mitral Valve: The mitral valve is normal in structure. Trivial mitral  valve regurgitation. No evidence of mitral valve stenosis.   Tricuspid Valve: The tricuspid valve is normal in structure. Tricuspid  valve regurgitation is trivial. No evidence of tricuspid stenosis.   Aortic Valve: The aortic valve is calcified. There is mild calcification  of the aortic valve. There is mild thickening of the aortic valve. Aortic  valve regurgitation is not visualized. Mild aortic valve sclerosis is  present, with no evidence of aortic  valve stenosis.   Pulmonic Valve: The pulmonic valve was normal in structure. Pulmonic valve  regurgitation is not visualized. No evidence of pulmonic stenosis.   Aorta: Aortic dilatation noted. There is mild dilatation of the ascending  aorta, measuring 39 mm.   Venous: The inferior vena cava is normal in size with greater than 50%  respiratory variability, suggesting right atrial pressure of 3 mmHg.   IAS/Shunts: No atrial level shunt detected by color flow Doppler.      Echo 01/14/20 IMPRESSIONS     1. Left ventricular ejection fraction, by visual estimation, is 60 to  65%. The left ventricle has normal function. There is moderately increased  left ventricular hypertrophy.   2. Moderate asymmetric hypertrophy of the basal septum measuring 44mm  (posterior wall 59mm)   3. The mitral valve is normal in structure. No evidence of mitral valve  regurgitation.   4. The aortic valve is tricuspid. Aortic valve regurgitation is trivial.  Mild aortic valve sclerosis without stenosis.   5. The  tricuspid valve is grossly normal.   6. The pulmonic valve was not well visualized. Pulmonic valve  regurgitation is trivial.   7. Global right ventricle has normal systolic function.The right  ventricular size is normal.   8. Left atrial size was normal.   9. Right atrial size was normal.  10. TR signal is inadequate for assessing pulmonary artery systolic  pressure.      Risk Assessment/Calculations:         ASSESSMENT:    1. Chronic diastolic CHF (congestive heart failure) (HCC)   2. Essential hypertension   3. Aortic atherosclerosis (HCC)   4. History of pulmonary embolus (PE)   5. Stage 3 chronic kidney disease, unspecified whether stage 3a or 3b CKD (HCC)      PLAN:  In order of problems listed above:  Chronic diastolic CHF-hospitallized after a long road trip eating fast food.  Diuresed 3.2 L and 16 pounds but creatinine bumped up.  Was not sent home on Lasix.  Echo with hyperdynamic LVEF greater than 75% with moderate LVH and grade 1 DD.  Was given as needed Lasix by PCP. No CHF on exam and working with nutritionist on low salt diet.   Hypertension blood pressure was elevated in the hospital.  Carvedilol was continued.  Recommend outpatient referral to nephrology to see if they would approve ARB for suspected hypertensive renal disease. Crt 1.67 04/03/21. BP contorlled today.  Coronary calcifications on CT.  Recommend outpatient calcium score-scheduled May 5  History of pulmonary embolus on chronic anticoagulation with Eliquis  CKD stage 3 refer to renal.   Shared Decision Making/Informed Consent         Shared Decision Making/Informed Consent        Medication Adjustments/Labs and Tests Ordered: Current medicines are reviewed at length  with the patient today.  Concerns regarding medicines are outlined above.  Medication changes, Labs and Tests ordered today are listed in the Patient Instructions below. Patient Instructions   Medication Instructions:   Your physician recommends that you continue on your current medications as directed. Please refer to the Current Medication list given to you today.  *If you need a refill on your cardiac medications before your next appointment, please call your pharmacy*   Lab Work: None ordered   If you have labs (blood work) drawn today and your tests are completely normal, you will receive your results only by: [facility] MyChart Message (if you have MyChart) OR . A paper copy in the mail If you have any lab test that is abnormal or we need to change your treatment, we will call you to review the results.   Testing/Procedures: None ordered    Follow-Up: Follow up with Dr. Radford Pax as planned   Your physician has referred you to see Nephrology. Someone will contact you with a appointment   Other Instructions Two Gram Sodium Diet 2000 mg  What is Sodium? Sodium is a mineral found naturally in many foods. The most significant source of sodium in the diet is table salt, which is about 40% sodium.  Processed, convenience, and preserved foods also contain a large amount of sodium.  The body needs only 500 mg of sodium daily to function,  A normal diet provides more than enough sodium even if you do not use salt.  Why Limit Sodium? A build up of sodium in the body can cause thirst, increased blood pressure, shortness of breath, and water retention.  Decreasing sodium in the diet can reduce edema and risk of heart attack or stroke associated with high blood pressure.  Keep in mind that there are many other factors involved in these health problems.  Heredity, obesity, lack of exercise, cigarette smoking, stress and what you eat all play a role.  General Guidelines:  Do not add salt at the table or in cooking.  One teaspoon of salt contains over 2 grams of sodium.  Read food labels  Avoid processed and convenience foods  Ask your dietitian before eating any foods not dicussed in the menu planning  guidelines  Consult your physician if you wish to use a salt substitute or a sodium containing medication such as antacids.  Limit milk and milk products to 16 oz (2 cups) per day.  Shopping Hints:  READ LABELS!! "Dietetic" does not necessarily mean low sodium.  Salt and other sodium ingredients are often added to foods during processing.   Menu Planning Guidelines Food Group Choose More Often Avoid  Beverages (see also the milk group All fruit juices, low-sodium, salt-free vegetables juices, low-sodium carbonated beverages Regular vegetable or tomato juices, commercially softened water used for drinking or cooking  Breads and Cereals Enriched white, wheat, rye and pumpernickel bread, hard rolls and dinner rolls; muffins, cornbread and waffles; most dry cereals, cooked cereal without added salt; unsalted crackers and breadsticks; low sodium or homemade bread crumbs Bread, rolls and crackers with salted tops; quick breads; instant hot cereals; pancakes; commercial bread stuffing; self-rising flower and biscuit mixes; regular bread crumbs or cracker crumbs  Desserts and Sweets Desserts and sweets mad with mild should be within allowance Instant pudding mixes and cake mixes  Fats Butter or margarine; vegetable oils; unsalted salad dressings, regular salad dressings limited to 1 Tbs; light, sour and heavy cream Regular salad dressings containing bacon fat, bacon bits,  and salt pork; snack dips made with instant soup mixes or processed cheese; salted nuts  Fruits Most fresh, frozen and canned fruits Fruits processed with salt or sodium-containing ingredient (some dried fruits are processed with sodium sulfites        Vegetables Fresh, frozen vegetables and low- sodium canned vegetables Regular canned vegetables, sauerkraut, pickled vegetables, and others prepared in brine; frozen vegetables in sauces; vegetables seasoned with ham, bacon or salt pork  Condiments, Sauces, Miscellaneous  Salt  substitute with physician's approval; pepper, herbs, spices; vinegar, lemon or lime juice; hot pepper sauce; garlic powder, onion powder, low sodium soy sauce (1 Tbs.); low sodium condiments (ketchup, chili sauce, mustard) in limited amounts (1 tsp.) fresh ground horseradish; unsalted tortilla chips, pretzels, potato chips, popcorn, salsa (1/4 cup) Any seasoning made with salt including garlic salt, celery salt, onion salt, and seasoned salt; sea salt, rock salt, kosher salt; meat tenderizers; monosodium glutamate; mustard, regular soy sauce, barbecue, sauce, chili sauce, teriyaki sauce, steak sauce, Worcestershire sauce, and most flavored vinegars; canned gravy and mixes; regular condiments; salted snack foods, olives, picles, relish, horseradish sauce, catsup   Food preparation: Try these seasonings Meats:    Pork Sage, onion Serve with applesauce  Chicken Poultry seasoning, thyme, parsley Serve with cranberry sauce  Lamb Curry powder, rosemary, garlic, thyme Serve with mint sauce or jelly  Veal Marjoram, basil Serve with current jelly, cranberry sauce  Beef Pepper, bay leaf Serve with dry mustard, unsalted chive butter  Fish [place] leaf, dill Serve with unsalted lemon butter, unsalted parsley butter  Vegetables:    Asparagus Lemon juice   Broccoli Lemon juice   Carrots Mustard dressing parsley, mint, nutmeg, glazed with unsalted butter and sugar   Green beans Marjoram, lemon juice, nutmeg,dill seed   Tomatoes Basil, marjoram, onion   Spice /blend for [employer]" 4 tsp ground thyme 1 tsp ground sage 3 tsp ground rosemary 4 tsp ground marjoram   Test your knowledge 1. A product that says "Salt Free" may still contain sodium. True or False 2. Garlic Powder and Hot Pepper Sauce an be used as alternative seasonings.True or False 3. Processed foods have more sodium than fresh foods.  True or False 4. Canned Vegetables have less sodium than froze True or False  WAYS TO DECREASE YOUR SODIUM  INTAKE 1. Avoid the use of added salt in cooking and at the table.  Table salt (and other prepared seasonings which contain salt) is probably one of the greatest sources of sodium in the diet.  Unsalted foods can gain flavor from the sweet, sour, and butter taste sensations of herbs and spices.  Instead of using salt for seasoning, try the following seasonings with the foods listed.  Remember: how you use them to enhance natural food flavors is limited only by your creativity... Allspice-Meat, fish, eggs, fruit, peas, red and yellow vegetables Almond Extract-Fruit baked goods Anise Seed-Sweet breads, fruit, carrots, beets, cottage cheese, cookies (tastes like licorice) Basil-Meat, fish, eggs, vegetables, rice, vegetables salads, soups, sauces Bay Leaf-Meat, fish, stews, poultry Burnet-Salad, vegetables (cucumber-like flavor) Caraway Seed-Bread, cookies, cottage cheese, meat, vegetables, cheese, rice Cardamon-Baked goods, fruit, soups Celery Powder or seed-Salads, salad dressings, sauces, meatloaf, soup, bread.Do not use  celery salt Chervil-Meats, salads, fish, eggs, vegetables, cottage cheese (parsley-like flavor) Chili Power-Meatloaf, chicken cheese, corn, eggplant, egg dishes Chives-Salads cottage cheese, egg dishes, soups, vegetables, sauces Cilantro-Salsa, casseroles Cinnamon-Baked goods, fruit, pork, lamb, chicken, carrots Cloves-Fruit, baked goods, fish, pot roast, green beans, beets, carrots Coriander-Pastry, cookies,  meat, salads, cheese (lemon-orange flavor) Cumin-Meatloaf, fish,cheese, eggs, cabbage,fruit pie (caraway flavor) [employer], fruit, eggs, fish, poultry, cottage cheese, vegetables Dill Seed-Meat, cottage cheese, poultry, vegetables, fish, salads, bread Fennel Seed-Bread, cookies, apples, pork, eggs, fish, beets, cabbage, cheese, Licorice-like flavor Garlic-(buds or powder) Salads, meat, poultry, fish, bread, butter, vegetables, potatoes.Do not  use garlic  salt Ginger-Fruit, vegetables, baked goods, meat, fish, poultry Horseradish Root-Meet, vegetables, butter Lemon Juice or Extract-Vegetables, fruit, tea, baked goods, fish salads Mace-Baked goods fruit, vegetables, fish, poultry (taste like nutmeg) Maple Extract-Syrups Marjoram-Meat, chicken, fish, vegetables, breads, green salads (taste like Sage) Mint-Tea, lamb, sherbet, vegetables, desserts, carrots, cabbage Mustard, Dry or Seed-Cheese, eggs, meats, vegetables, poultry Nutmeg-Baked goods, fruit, chicken, eggs, vegetables, desserts Onion Powder-Meat, fish, poultry, vegetables, cheese, eggs, bread, rice salads (Do not use   Onion salt) Orange Extract-Desserts, baked goods Oregano-Pasta, eggs, cheese, onions, pork, lamb, fish, chicken, vegetables, green salads Paprika-Meat, fish, poultry, eggs, cheese, vegetables Parsley Flakes-Butter, vegetables, meat fish, poultry, eggs, bread, salads (certain forms may   Contain sodium Pepper-Meat fish, poultry, vegetables, eggs Peppermint Extract-Desserts, baked goods Poppy Seed-Eggs, bread, cheese, fruit dressings, baked goods, noodles, vegetables, cottage  [employer], poultry, meat, fish, cauliflower, turnips,eggs bread Saffron-Rice, bread, veal, chicken, fish, eggs Sage-Meat, fish, poultry, onions, eggplant, tomateos, pork, stews Savory-Eggs, salads, poultry, meat, rice, vegetables, soups, pork Tarragon-Meat, poultry, fish, eggs, butter, vegetables (licorice-like flavor)  Thyme-Meat, poultry, fish, eggs, vegetables, (clover-like flavor), sauces, soups Tumeric-Salads, butter, eggs, fish, rice, vegetables (saffron-like flavor) Vanilla Extract-Baked goods, candy Vinegar-Salads, vegetables, meat marinades Walnut Extract-baked goods, candy  2. Choose your Foods Wisely   The following is a list of foods to avoid which are high in sodium:  Meats-Avoid all smoked, canned, salt cured, dried and kosher meat and  fish as well as Anchovies   [employer] meats:Bologna, Liverwurst, Pastrami Canned meat or fish  Marinated herring Caviar    Pepperoni Corned Beef   Pizza Dried chipped beef  Salami Frozen breaded fish or meat Salt pork Frankfurters or hot dogs  Sardines Gefilte fish   Sausage Ham (boiled ham, Proscuitto Smoked butt    spiced ham)   Spam      TV Dinners Vegetables Canned vegetables (Regular) Relish Canned mushrooms  Sauerkraut Olives    Tomato juice Pickles  Bakery and Dessert Products Canned puddings  Cream pies Cheesecake   Decorated cakes Cookies  Beverages/Juices Tomato juice, regular  Gatorade   V-8 vegetable juice, regular  Breads and Cereals Biscuit mixes   Salted potato chips, corn chips, pretzels Bread stuffing mixes  Salted crackers and rolls Pancake and waffle mixes Self-rising flour  Seasonings Accent    Meat sauces Barbecue sauce  Meat tenderizer Catsup    Monosodium glutamate (MSG) Celery salt   Onion salt Chili sauce   Prepared mustard Garlic salt   Salt, seasoned salt, sea salt Gravy mixes   Soy sauce Horseradish   Steak sauce Ketchup   Tartar sauce Lite salt    Teriyaki sauce Marinade mixes   Worcestershire sauce  Others Baking powder   Cocoa and cocoa mixes Baking soda   Commercial casserole mixes Candy-caramels, chocolate  Dehydrated soups    Bars, fudge,nougats  Instant rice and pasta mixes Canned broth or soup  Maraschino cherries Cheese, aged and processed cheese and cheese spreads  Learning Assessment Quiz  Indicated T (for True) or F (for False) for each of the following statements:  1. _____ Fresh fruits and vegetables and unprocessed grains  are generally low in sodium 2. _____ Water may contain a considerable amount of sodium, depending on the source 3. _____ You can always tell if a food is high in sodium by tasting it 4. _____ Certain laxatives my be high in sodium and should be avoided unless prescribed   by a  physician or pharmacist 5. _____ Salt substitutes may be used freely by anyone on a sodium restricted diet 6. _____ Sodium is present in table salt, food additives and as a natural component of   most foods 7. _____ Table salt is approximately 90% sodium 8. _____ Limiting sodium intake may help prevent excess fluid accumulation in the body 9. _____ On a sodium-restricted diet, seasonings such as bouillon soy sauce, and    cooking wine should be used in place of table salt 10. _____ On an ingredient list, a product which lists monosodium glutamate as the first   ingredient is an appropriate food to include on a low sodium diet  Circle the best answer(s) to the following statements (Hint: there may be more than one correct answer)  11. On a low-sodium diet, some acceptable snack items are:    A. Olives  F. Bean dip   K. Grapefruit juice    B. Salted Pretzels G. Commercial Popcorn   L. Canned peaches    C. Carrot Sticks  H. Bouillon   M. Unsalted nuts   D. [place] fries  I. Peanut butter crackers N. Salami   E. Sweet pickles J. Tomato Juice   O. Pizza  12.  Seasonings that may be used freely on a reduced - sodium diet include   A. Lemon wedges F.Monosodium glutamate K. Celery seed    B.Soysauce   G. Pepper   L. Mustard powder   C. Sea salt  H. Cooking wine  M. Onion flakes   D. Vinegar  E. Prepared horseradish N. Salsa   E. Sage   J. Worcestershire sauce  O. Chutney      Signed, Ermalinda Barrios, PA-C  04/25/2021 2:15 PM    [facility] [place], [place],   [address] Phone: [phone]; Fax: [phone]

## 2021-04-25 NOTE — Addendum Note (Signed)
Addended by: Mendel Ryder on: 04/25/2021 02:19 PM   Modules accepted: Orders

## 2021-04-26 ENCOUNTER — Other Ambulatory Visit: Payer: Self-pay | Admitting: Family Medicine

## 2021-05-03 ENCOUNTER — Other Ambulatory Visit: Payer: Self-pay

## 2021-05-07 ENCOUNTER — Telehealth: Payer: Medicare Other

## 2021-05-08 DIAGNOSIS — Z87442 Personal history of urinary calculi: Secondary | ICD-10-CM | POA: Diagnosis not present

## 2021-05-08 DIAGNOSIS — I2699 Other pulmonary embolism without acute cor pulmonale: Secondary | ICD-10-CM | POA: Diagnosis not present

## 2021-05-08 DIAGNOSIS — N1831 Chronic kidney disease, stage 3a: Secondary | ICD-10-CM | POA: Diagnosis not present

## 2021-05-08 DIAGNOSIS — D631 Anemia in chronic kidney disease: Secondary | ICD-10-CM | POA: Diagnosis not present

## 2021-05-08 DIAGNOSIS — N189 Chronic kidney disease, unspecified: Secondary | ICD-10-CM | POA: Diagnosis not present

## 2021-05-08 DIAGNOSIS — I5032 Chronic diastolic (congestive) heart failure: Secondary | ICD-10-CM | POA: Diagnosis not present

## 2021-05-08 DIAGNOSIS — N1832 Chronic kidney disease, stage 3b: Secondary | ICD-10-CM | POA: Diagnosis not present

## 2021-05-08 DIAGNOSIS — I129 Hypertensive chronic kidney disease with stage 1 through stage 4 chronic kidney disease, or unspecified chronic kidney disease: Secondary | ICD-10-CM | POA: Diagnosis not present

## 2021-05-08 DIAGNOSIS — R809 Proteinuria, unspecified: Secondary | ICD-10-CM | POA: Diagnosis not present

## 2021-05-09 ENCOUNTER — Other Ambulatory Visit: Payer: Self-pay | Admitting: Nephrology

## 2021-05-09 DIAGNOSIS — N1832 Chronic kidney disease, stage 3b: Secondary | ICD-10-CM

## 2021-05-11 ENCOUNTER — Ambulatory Visit
Admission: RE | Admit: 2021-05-11 | Discharge: 2021-05-11 | Disposition: A | Discharge status: Home / Self Care | Payer: Medicare Other | Source: Ambulatory Visit | Attending: Nephrology | Admitting: Nephrology

## 2021-05-11 DIAGNOSIS — N200 Calculus of kidney: Secondary | ICD-10-CM | POA: Diagnosis not present

## 2021-05-11 DIAGNOSIS — N189 Chronic kidney disease, unspecified: Secondary | ICD-10-CM | POA: Diagnosis not present

## 2021-05-11 DIAGNOSIS — N3289 Other specified disorders of bladder: Secondary | ICD-10-CM | POA: Diagnosis not present

## 2021-05-11 DIAGNOSIS — N1832 Chronic kidney disease, stage 3b: Secondary | ICD-10-CM

## 2021-05-11 LAB — CBC AND DIFFERENTIAL
HCT: 40 — AB (ref 41–53)
Hemoglobin: 12.5 — AB (ref 13.5–17.5)
Neutrophils Absolute: 6.2
Platelets: 282 (ref 150–399)
WBC: 8.9

## 2021-05-11 LAB — IRON,TIBC AND FERRITIN PANEL
Ferritin: 16
Iron: 32
TIBC: 328
UIBC: 296

## 2021-05-11 LAB — BASIC METABOLIC PANEL
BUN: 22 — AB (ref 4–21)
CO2: 24 — AB (ref 13–22)
Chloride: 104 (ref 99–108)
Creatinine: 1.7 — AB (ref 0.6–1.3)
Glucose: 121
Potassium: 4.5 (ref 3.4–5.3)
Sodium: 142 (ref 137–147)

## 2021-05-11 LAB — COMPREHENSIVE METABOLIC PANEL
Albumin: 3.8 (ref 3.5–5.0)
Calcium: 9.2 (ref 8.7–10.7)

## 2021-05-11 LAB — CBC: RBC: 5.15 — AB (ref 3.87–5.11)

## 2021-05-13 ENCOUNTER — Other Ambulatory Visit: Payer: Self-pay | Admitting: Family Medicine

## 2021-05-21 DIAGNOSIS — M25562 Pain in left knee: Secondary | ICD-10-CM | POA: Diagnosis not present

## 2021-05-21 DIAGNOSIS — M25561 Pain in right knee: Secondary | ICD-10-CM | POA: Diagnosis not present

## 2021-05-21 DIAGNOSIS — G8929 Other chronic pain: Secondary | ICD-10-CM | POA: Diagnosis not present

## 2021-05-21 DIAGNOSIS — R269 Unspecified abnormalities of gait and mobility: Secondary | ICD-10-CM | POA: Diagnosis not present

## 2021-05-22 DIAGNOSIS — M25561 Pain in right knee: Secondary | ICD-10-CM | POA: Diagnosis not present

## 2021-05-22 DIAGNOSIS — M1711 Unilateral primary osteoarthritis, right knee: Secondary | ICD-10-CM | POA: Diagnosis not present

## 2021-05-23 DIAGNOSIS — M1711 Unilateral primary osteoarthritis, right knee: Secondary | ICD-10-CM | POA: Diagnosis not present

## 2021-05-24 ENCOUNTER — Encounter: Payer: Self-pay | Admitting: Family Medicine

## 2021-05-31 ENCOUNTER — Other Ambulatory Visit: Payer: Self-pay

## 2021-05-31 ENCOUNTER — Ambulatory Visit (INDEPENDENT_AMBULATORY_CARE_PROVIDER_SITE_OTHER)
Admission: RE | Admit: 2021-05-31 | Discharge: 2021-05-31 | Disposition: A | Discharge status: Home / Self Care | Payer: Self-pay | Source: Ambulatory Visit | Attending: Cardiology | Admitting: Cardiology

## 2021-05-31 DIAGNOSIS — I10 Essential (primary) hypertension: Secondary | ICD-10-CM

## 2021-06-04 DIAGNOSIS — R269 Unspecified abnormalities of gait and mobility: Secondary | ICD-10-CM | POA: Diagnosis not present

## 2021-06-04 DIAGNOSIS — M25562 Pain in left knee: Secondary | ICD-10-CM | POA: Diagnosis not present

## 2021-06-04 DIAGNOSIS — G8929 Other chronic pain: Secondary | ICD-10-CM | POA: Diagnosis not present

## 2021-06-04 DIAGNOSIS — M25561 Pain in right knee: Secondary | ICD-10-CM | POA: Diagnosis not present

## 2021-06-05 ENCOUNTER — Other Ambulatory Visit: Payer: Self-pay

## 2021-06-05 ENCOUNTER — Ambulatory Visit (INDEPENDENT_AMBULATORY_CARE_PROVIDER_SITE_OTHER): Payer: Medicare Other | Admitting: Family Medicine

## 2021-06-05 ENCOUNTER — Encounter (INDEPENDENT_AMBULATORY_CARE_PROVIDER_SITE_OTHER): Payer: Self-pay | Admitting: Family Medicine

## 2021-06-05 VITALS — BP 108/69 | HR 64 | Temp 98.4°F | Ht 71.0 in | Wt 230.0 lb

## 2021-06-05 DIAGNOSIS — E538 Deficiency of other specified B group vitamins: Secondary | ICD-10-CM

## 2021-06-05 DIAGNOSIS — Z6832 Body mass index (BMI) 32.0-32.9, adult: Secondary | ICD-10-CM

## 2021-06-05 DIAGNOSIS — R739 Hyperglycemia, unspecified: Secondary | ICD-10-CM

## 2021-06-05 DIAGNOSIS — E669 Obesity, unspecified: Secondary | ICD-10-CM

## 2021-06-05 DIAGNOSIS — E7849 Other hyperlipidemia: Secondary | ICD-10-CM

## 2021-06-05 DIAGNOSIS — I10 Essential (primary) hypertension: Secondary | ICD-10-CM

## 2021-06-05 DIAGNOSIS — D649 Anemia, unspecified: Secondary | ICD-10-CM

## 2021-06-05 DIAGNOSIS — E559 Vitamin D deficiency, unspecified: Secondary | ICD-10-CM | POA: Diagnosis not present

## 2021-06-05 DIAGNOSIS — Z0289 Encounter for other administrative examinations: Secondary | ICD-10-CM

## 2021-06-05 DIAGNOSIS — Z1331 Encounter for screening for depression: Secondary | ICD-10-CM | POA: Diagnosis not present

## 2021-06-05 DIAGNOSIS — N183 Chronic kidney disease, stage 3 unspecified: Secondary | ICD-10-CM

## 2021-06-05 DIAGNOSIS — E66811 Obesity, class 1: Secondary | ICD-10-CM

## 2021-06-05 DIAGNOSIS — R0602 Shortness of breath: Secondary | ICD-10-CM

## 2021-06-05 DIAGNOSIS — R5383 Other fatigue: Secondary | ICD-10-CM

## 2021-06-05 DIAGNOSIS — E785 Hyperlipidemia, unspecified: Secondary | ICD-10-CM

## 2021-06-06 DIAGNOSIS — R269 Unspecified abnormalities of gait and mobility: Secondary | ICD-10-CM | POA: Diagnosis not present

## 2021-06-06 DIAGNOSIS — Z789 Other specified health status: Secondary | ICD-10-CM | POA: Diagnosis not present

## 2021-06-06 DIAGNOSIS — M25561 Pain in right knee: Secondary | ICD-10-CM | POA: Diagnosis not present

## 2021-06-06 DIAGNOSIS — G8929 Other chronic pain: Secondary | ICD-10-CM | POA: Diagnosis not present

## 2021-06-06 DIAGNOSIS — M25562 Pain in left knee: Secondary | ICD-10-CM | POA: Diagnosis not present

## 2021-06-06 DIAGNOSIS — Z7409 Other reduced mobility: Secondary | ICD-10-CM | POA: Diagnosis not present

## 2021-06-06 LAB — LIPID PANEL WITH LDL/HDL RATIO
Cholesterol, Total: 103 mg/dL (ref 100–199)
HDL: 38 mg/dL — ABNORMAL LOW (ref >39)
LDL Chol Calc (NIH): 52 mg/dL (ref 0–99)
LDL/HDL Ratio: 1.4 ratio (ref 0.0–3.6)
Triglycerides: 57 mg/dL (ref 0–149)
VLDL Cholesterol Cal: 13 mg/dL (ref 5–40)

## 2021-06-06 LAB — COMPREHENSIVE METABOLIC PANEL
ALT: 24 IU/L (ref 0–44)
AST: 20 IU/L (ref 0–40)
Albumin/Globulin Ratio: 1.6 (ref 1.2–2.2)
Albumin: 3.8 g/dL (ref 3.7–4.7)
Alkaline Phosphatase: 85 IU/L (ref 44–121)
BUN/Creatinine Ratio: 15 (ref 10–24)
BUN: 22 mg/dL (ref 8–27)
Bilirubin Total: 0.4 mg/dL (ref 0.0–1.2)
CO2: 22 mmol/L (ref 20–29)
Calcium: 8.9 mg/dL (ref 8.6–10.2)
Chloride: 110 mmol/L — ABNORMAL HIGH (ref 96–106)
Creatinine, Ser: 1.42 mg/dL — ABNORMAL HIGH (ref 0.76–1.27)
Globulin, Total: 2.4 g/dL (ref 1.5–4.5)
Glucose: 98 mg/dL (ref 65–99)
Potassium: 4.6 mmol/L (ref 3.5–5.2)
Sodium: 149 mmol/L — ABNORMAL HIGH (ref 134–144)
Total Protein: 6.2 g/dL (ref 6.0–8.5)
eGFR: 52 mL/min/{1.73_m2} — ABNORMAL LOW (ref >59)

## 2021-06-06 LAB — HEMOGLOBIN A1C
Est. average glucose Bld gHb Est-mCnc: 111 mg/dL
Hgb A1c MFr Bld: 5.5 % (ref 4.8–5.6)

## 2021-06-06 LAB — INSULIN, RANDOM: INSULIN: 14.9 u[IU]/mL (ref 2.6–24.9)

## 2021-06-06 LAB — VITAMIN B12: Vitamin B-12: 335 pg/mL (ref 232–1245)

## 2021-06-06 LAB — VITAMIN D 25 HYDROXY (VIT D DEFICIENCY, FRACTURES): Vit D, 25-Hydroxy: 84.5 ng/mL (ref 30.0–100.0)

## 2021-06-06 LAB — T3: T3, Total: 98 ng/dL (ref 71–180)

## 2021-06-06 LAB — T4: T4, Total: 6.9 ug/dL (ref 4.5–12.0)

## 2021-06-06 LAB — TSH: TSH: 1.93 u[IU]/mL (ref 0.450–4.500)

## 2021-06-11 DIAGNOSIS — M25561 Pain in right knee: Secondary | ICD-10-CM | POA: Diagnosis not present

## 2021-06-11 DIAGNOSIS — G8929 Other chronic pain: Secondary | ICD-10-CM | POA: Diagnosis not present

## 2021-06-11 DIAGNOSIS — Z789 Other specified health status: Secondary | ICD-10-CM | POA: Diagnosis not present

## 2021-06-11 DIAGNOSIS — Z7409 Other reduced mobility: Secondary | ICD-10-CM | POA: Diagnosis not present

## 2021-06-11 DIAGNOSIS — M25562 Pain in left knee: Secondary | ICD-10-CM | POA: Diagnosis not present

## 2021-06-11 DIAGNOSIS — R269 Unspecified abnormalities of gait and mobility: Secondary | ICD-10-CM | POA: Diagnosis not present

## 2021-06-13 NOTE — Progress Notes (Signed)
Chief Complaint:   OBESITY Bruce Ball (MR# [id number]) is a 75 y.o. male who presents for evaluation and treatment of obesity and related comorbidities. Current BMI is Body mass index is 32.08 kg/m. Bruce Ball has been struggling with his weight for many years and has been unsuccessful in either losing weight, maintaining weight loss, or reaching his healthy weight goal.  Bruce Ball is currently in the action stage of change and ready to dedicate time achieving and maintaining a healthier weight. Bruce Ball is interested in becoming our patient and working on intensive lifestyle modifications including (but not limited to) diet and exercise for weight loss.  Bruce Ball's habits were reviewed today and are as follows: His family eats meals together, he thinks his family will eat healthier with him, his desired weight loss is 110 lbs, he started gaining weight 75 years old, his heaviest weight ever was 245 pounds, and he skips meals frequently.  Depression Screen Bruce Ball's Food and Mood (modified PHQ-9) score was 7.  Depression screen PHQ 2/9 06/05/2021  Decreased Interest 2  Down, Depressed, Hopeless 2  PHQ - 2 Score 4  Altered sleeping 2  Tired, decreased energy 2  Change in appetite 3  Feeling bad or failure about yourself  1  Trouble concentrating 3  Moving slowly or fidgety/restless 0  Suicidal thoughts 0  PHQ-9 Score 15   Subjective:   1. Other fatigue Bruce Ball admits to daytime somnolence and admits to waking up still tired. Patent has a history of symptoms of daytime fatigue. Bruce Ball generally gets 8 hours of sleep per night, and states that he has generally restful sleep. Snoring is present. Apneic episodes are not present. Epworth Sleepiness Score is 15.  2. SOB (shortness of breath) on exertion Bruce Ball notes increasing shortness of breath with exercising and seems to be worsening over time with weight gain. He notes getting out of breath sooner with activity than he used to. This has  not gotten worse recently. Bruce Ball denies shortness of breath at rest or orthopnea.  3. B12 deficiency Bruce Ball has a history of B12 deficiency. He has no recent labs. He is not a vegetarian but his protein is lower than goal.  4. Vitamin D deficiency Bruce Ball has a history of Vit D deficiency. He has no recent labs and he notes fatigue.  5. Primary hypertension Bruce Ball has hypertension with chronic kidney disease stage 3. His blood pressure is well controlled. He is ready to work on diet and exercise.  6. Other hyperlipidemia Bruce Ball is on Lipitor and he is working on diet. He denies chest pain, but he notes a history of congestive heart failure and positive calcium score.  7. Hyperglycemia Bruce Ball has multiple elevated glucose readings. He has not been eating much but his simple carbohydrates likely have increased.  Assessment/Plan:   1. Other fatigue Bruce Ball does feel that his weight is causing his energy to be lower than it should be. Fatigue may be related to obesity, depression or many other causes. Labs will be ordered, and in the meanwhile, Bruce Ball will focus on self care including making healthy food choices, increasing physical activity and focusing on stress reduction.  - TSH - T3 - T4 - EKG 12-Lead  2. SOB (shortness of breath) on exertion Bruce Ball does feel that he gets out of breath more easily that he used to when he exercises. Bruce Ball's shortness of breath appears to be obesity related and exercise induced. He has agreed to work on weight loss and gradually  increase exercise to treat his exercise induced shortness of breath. Will continue to monitor closely.  3. B12 deficiency The diagnosis was reviewed with the patient. We will check labs today. Bruce Ball will start his Category 2 plan. Orders and follow up as documented in patient record.  - Vitamin B12  4. Vitamin D deficiency Low Vitamin D level contributes to fatigue and are associated with obesity, breast, and colon cancer.  We will check labs today, and Bruce Ball will follow-up for routine testing of Vitamin D, at least 2-3 times per year to avoid over-replacement.  - VITAMIN D 25 Hydroxy (Vit-D Deficiency, Fractures)  5. Primary hypertension Bruce Ball start his Category 2 plan, and will continue working on healthy weight loss and exercise to improve blood pressure control. We will check labs today, and will watch for signs of hypotension as he continues his lifestyle modifications.  - Comprehensive metabolic panel  6. Other hyperlipidemia Cardiovascular risk and specific lipid/LDL goals reviewed. We discussed several lifestyle modifications today. Bruce Ball will start his Category 2 plan, and will continue to work on exercise and weight loss efforts. We will check labs today. Orders and follow up as documented in patient record.   - Lipid Panel With LDL/HDL Ratio  7. Hyperglycemia Fasting labs will be obtained and results with be discussed with Bruce Ball in 2 weeks at his follow up visit. In the meanwhile Bruce Ball will start his Category 2 plan and will work on weight loss efforts.  - Insulin, random - Hemoglobin A1c  8. Screening for depression Bruce Ball had a positive depression screening. Depression is commonly associated with obesity and often results in emotional eating behaviors. We will monitor this closely and work on CBT to help improve the non-hunger eating patterns. Referral to Psychology may be required if no improvement is seen as he continues in our clinic.  9. Obesity with current BMI 32.1 Bruce Ball is currently in the action stage of change and his goal is to continue with weight loss efforts. I recommend Bruce Ball begin the structured treatment plan as follows:  He has agreed to the Category 2 Plan.  Exercise goals: No exercise has been prescribed for now, while we concentrate on nutritional changes.   Behavioral modification strategies: increasing lean protein intake and meal planning and cooking  strategies.  He was informed of the importance of frequent follow-up visits to maximize his success with intensive lifestyle modifications for his multiple health conditions. He was informed we would discuss his lab results at his next visit unless there is a critical issue that needs to be addressed sooner. Bruce Ball agreed to keep his next visit at the agreed upon time to discuss these results.  Objective:   Blood pressure 108/69, pulse 64, temperature 98.4 F (36.9 C), height 5\' 11"  (1.803 m), weight 230 lb (104.3 kg), SpO2 (!) 69 %. Body mass index is 32.08 kg/m.  EKG: Normal sinus rhythm, rate 69 BPM.  Indirect Calorimeter completed today shows a VO2 of 218 and a REE of 1515.  His calculated basal metabolic rate is 3785 thus his basal metabolic rate is worse than expected.  General: Cooperative, alert, well developed, in no acute distress. HEENT: Conjunctivae and lids unremarkable. Cardiovascular: Regular rhythm.  Lungs: Normal work of breathing. Neurologic: No focal deficits.   Lab Results  Component Value Date   CREATININE 1.42 (H) 06/05/2021   BUN 22 06/05/2021   NA 149 (H) 06/05/2021   K 4.6 06/05/2021   CL 110 (H) 06/05/2021   CO2 22 06/05/2021  Lab Results  Component Value Date   ALT 24 06/05/2021   AST 20 06/05/2021   ALKPHOS 85 06/05/2021   BILITOT 0.4 06/05/2021   Lab Results  Component Value Date   HGBA1C 5.5 06/05/2021   HGBA1C 5.5 02/07/2020   HGBA1C 5.5 05/18/2016   Lab Results  Component Value Date   INSULIN 14.9 06/05/2021   Lab Results  Component Value Date   TSH 1.930 06/05/2021   Lab Results  Component Value Date   CHOL 103 06/05/2021   HDL 38 (L) 06/05/2021   LDLCALC 52 06/05/2021   LDLDIRECT 145.4 06/27/2011   TRIG 57 06/05/2021   CHOLHDL 3 04/03/2021   Lab Results  Component Value Date   WBC 8.9 05/11/2021   HGB 12.5 (A) 05/11/2021   HCT 40 (A) 05/11/2021   MCV 74.0 (L) 04/03/2021   PLT 282 05/11/2021   Lab Results  Component  Value Date   IRON 32 05/11/2021   TIBC 328 05/11/2021   FERRITIN 16 05/11/2021   Attestation Statements:   Reviewed by clinician on day of visit: allergies, medications, problem list, medical history, surgical history, family history, social history, and previous encounter notes.  Time spent on visit including pre-visit chart review and post-visit charting and care was 60 minutes.    I, Trixie Dredge, am acting as transcriptionist for Dennard Nip, MD.  I have reviewed the above documentation for accuracy and completeness, and I agree with the above. - Dennard Nip, MD

## 2021-06-18 DIAGNOSIS — M25561 Pain in right knee: Secondary | ICD-10-CM | POA: Diagnosis not present

## 2021-06-18 DIAGNOSIS — Z7409 Other reduced mobility: Secondary | ICD-10-CM | POA: Diagnosis not present

## 2021-06-18 DIAGNOSIS — R269 Unspecified abnormalities of gait and mobility: Secondary | ICD-10-CM | POA: Diagnosis not present

## 2021-06-18 DIAGNOSIS — G8929 Other chronic pain: Secondary | ICD-10-CM | POA: Diagnosis not present

## 2021-06-18 DIAGNOSIS — Z789 Other specified health status: Secondary | ICD-10-CM | POA: Diagnosis not present

## 2021-06-19 ENCOUNTER — Other Ambulatory Visit: Payer: Self-pay

## 2021-06-19 ENCOUNTER — Encounter (INDEPENDENT_AMBULATORY_CARE_PROVIDER_SITE_OTHER): Payer: Self-pay | Admitting: Family Medicine

## 2021-06-19 ENCOUNTER — Ambulatory Visit (INDEPENDENT_AMBULATORY_CARE_PROVIDER_SITE_OTHER): Payer: Medicare Other | Admitting: Family Medicine

## 2021-06-19 VITALS — BP 129/75 | HR 57 | Temp 98.4°F | Ht 71.0 in | Wt 229.0 lb

## 2021-06-19 DIAGNOSIS — N1831 Chronic kidney disease, stage 3a: Secondary | ICD-10-CM | POA: Diagnosis not present

## 2021-06-19 DIAGNOSIS — E559 Vitamin D deficiency, unspecified: Secondary | ICD-10-CM

## 2021-06-19 DIAGNOSIS — E669 Obesity, unspecified: Secondary | ICD-10-CM | POA: Diagnosis not present

## 2021-06-19 DIAGNOSIS — E538 Deficiency of other specified B group vitamins: Secondary | ICD-10-CM

## 2021-06-19 DIAGNOSIS — Z6832 Body mass index (BMI) 32.0-32.9, adult: Secondary | ICD-10-CM

## 2021-06-19 DIAGNOSIS — E8881 Metabolic syndrome: Secondary | ICD-10-CM

## 2021-06-25 NOTE — Progress Notes (Signed)
Chief Complaint:   OBESITY Bruce Ball is here to discuss his progress with his obesity treatment plan along with follow-up of his obesity related diagnoses. Bruce Ball is on the Category 2 Plan and states he is following his eating plan approximately 100% of the time. Bruce Ball states he is doing water walking for 45 minutes 5 times per week.  Today's visit was #: 2 Starting weight: 230 lbs Starting date: 06/05/2021 Today's weight: 229 lbs Today's date: 06/19/2021 Total lbs lost to date: 1 Total lbs lost since last in-office visit: 1  Interim History: Bruce Ball states he struggled to eat all of the food on his plan. He is also trying to decrease his Na+ level and found the food to be bland.  Subjective:   1. Vitamin D deficiency Bruce Ball is on Vit D 2,000 IU BID and his level is at goal. He is at risk of over-replacement especially with weight loss. I discussed labs with the patient today.  2. B12 deficiency Bruce Ball is not on B12 level is below goals, and he notes fatigue. I discussed labs with the patient today.  3. Insulin resistance Bruce Ball has a new diagnosis of insulin resistance. His A1c and glucose are within normal limits, but his fasting insulin is elevated. I discussed labs with the patient today.  4. Stage 3a chronic kidney disease (Bruce Ball) Bruce Ball's creatinine has improved, and he is working on weight loss and decreasing his sodium.   Assessment/Plan:   1. Vitamin D deficiency Low Vitamin D level contributes to fatigue and are associated with obesity, breast, and colon cancer. Bruce Ball agreed to continue taking Vitamin D 2,000 IU BID as is, and will recheck labs in 1 month. He will follow-up for routine testing of Vitamin D, at least 2-3 times per year to avoid over-replacement.  2. B12 deficiency The diagnosis was reviewed with the patient. Bruce Ball will continue his B12 rich plan, and we will continue to monitor. Orders and follow up as documented in patient record.  3. Insulin  resistance Bruce Ball will continue to work on diet, exercise, and decreasing simple carbohydrates to help decrease the risk of diabetes. Bruce Ball agreed to follow-up with [name] as directed to closely monitor his progress.  4. Stage 3a chronic kidney disease (Bruce Ball) Bruce Ball's goal of Na+ is below 2,000 grams daily, and low sodium seasoning list was given.  5. Obesity with current BMI 32.0 Bruce Ball is currently in the action stage of change. As such, his goal is to continue with weight loss efforts. He has agreed to change to keeping a food journal and adhering to recommended goals of 1100-1400 calories and 70-85 grams of protein daily.   Exercise goals: As is.  Behavioral modification strategies: increasing lean protein intake.  Bruce Ball has agreed to follow-up with our clinic in 2 weeks. He was informed of the importance of frequent follow-up visits to maximize his success with intensive lifestyle modifications for his multiple health conditions.   Objective:   Blood pressure 129/75, pulse (!) 57, temperature 98.4 F (36.9 C), height 5\' 11"  (1.803 m), weight 229 lb (103.9 kg), SpO2 95 %. Body mass index is 31.94 kg/m.  General: Cooperative, alert, well developed, in no acute distress. HEENT: Conjunctivae and lids unremarkable. Cardiovascular: Regular rhythm.  Lungs: Normal work of breathing. Neurologic: No focal deficits.   Lab Results  Component Value Date   CREATININE 1.42 (H) 06/05/2021   BUN 22 06/05/2021   NA 149 (H) 06/05/2021   K 4.6 06/05/2021   CL 110 (  H) 06/05/2021   CO2 22 06/05/2021   Lab Results  Component Value Date   ALT 24 06/05/2021   AST 20 06/05/2021   ALKPHOS 85 06/05/2021   BILITOT 0.4 06/05/2021   Lab Results  Component Value Date   HGBA1C 5.5 06/05/2021   HGBA1C 5.5 02/07/2020   HGBA1C 5.5 05/18/2016   Lab Results  Component Value Date   INSULIN 14.9 06/05/2021   Lab Results  Component Value Date   TSH 1.930 06/05/2021   Lab Results  Component Value  Date   CHOL 103 06/05/2021   HDL 38 (L) 06/05/2021   LDLCALC 52 06/05/2021   LDLDIRECT 145.4 06/27/2011   TRIG 57 06/05/2021   CHOLHDL 3 04/03/2021   Lab Results  Component Value Date   WBC 8.9 05/11/2021   HGB 12.5 (A) 05/11/2021   HCT 40 (A) 05/11/2021   MCV 74.0 (L) 04/03/2021   PLT 282 05/11/2021   Lab Results  Component Value Date   IRON 32 05/11/2021   TIBC 328 05/11/2021   FERRITIN 16 05/11/2021   Attestation Statements:   Reviewed by clinician on day of visit: allergies, medications, problem list, medical history, surgical history, family history, social history, and previous encounter notes.  Time spent on visit including pre-visit chart review and post-visit care and charting was 45 minutes.    I, Trixie Dredge, am acting as transcriptionist for Dennard Nip, MD.  I have reviewed the above documentation for accuracy and completeness, and I agree with the above. -  Dennard Nip, MD

## 2021-06-29 IMAGING — DX DG HIP (WITH OR WITHOUT PELVIS) 2-3V*L*
3 series · 3 of 3 positions shown · non-contrast
Comparison: 03/06/2007, CT 05/30/2019

CLINICAL DATA: Hip pain

EXAM:
DG HIP (WITH OR WITHOUT PELVIS) 2-3V LEFT

[pelvis ap]
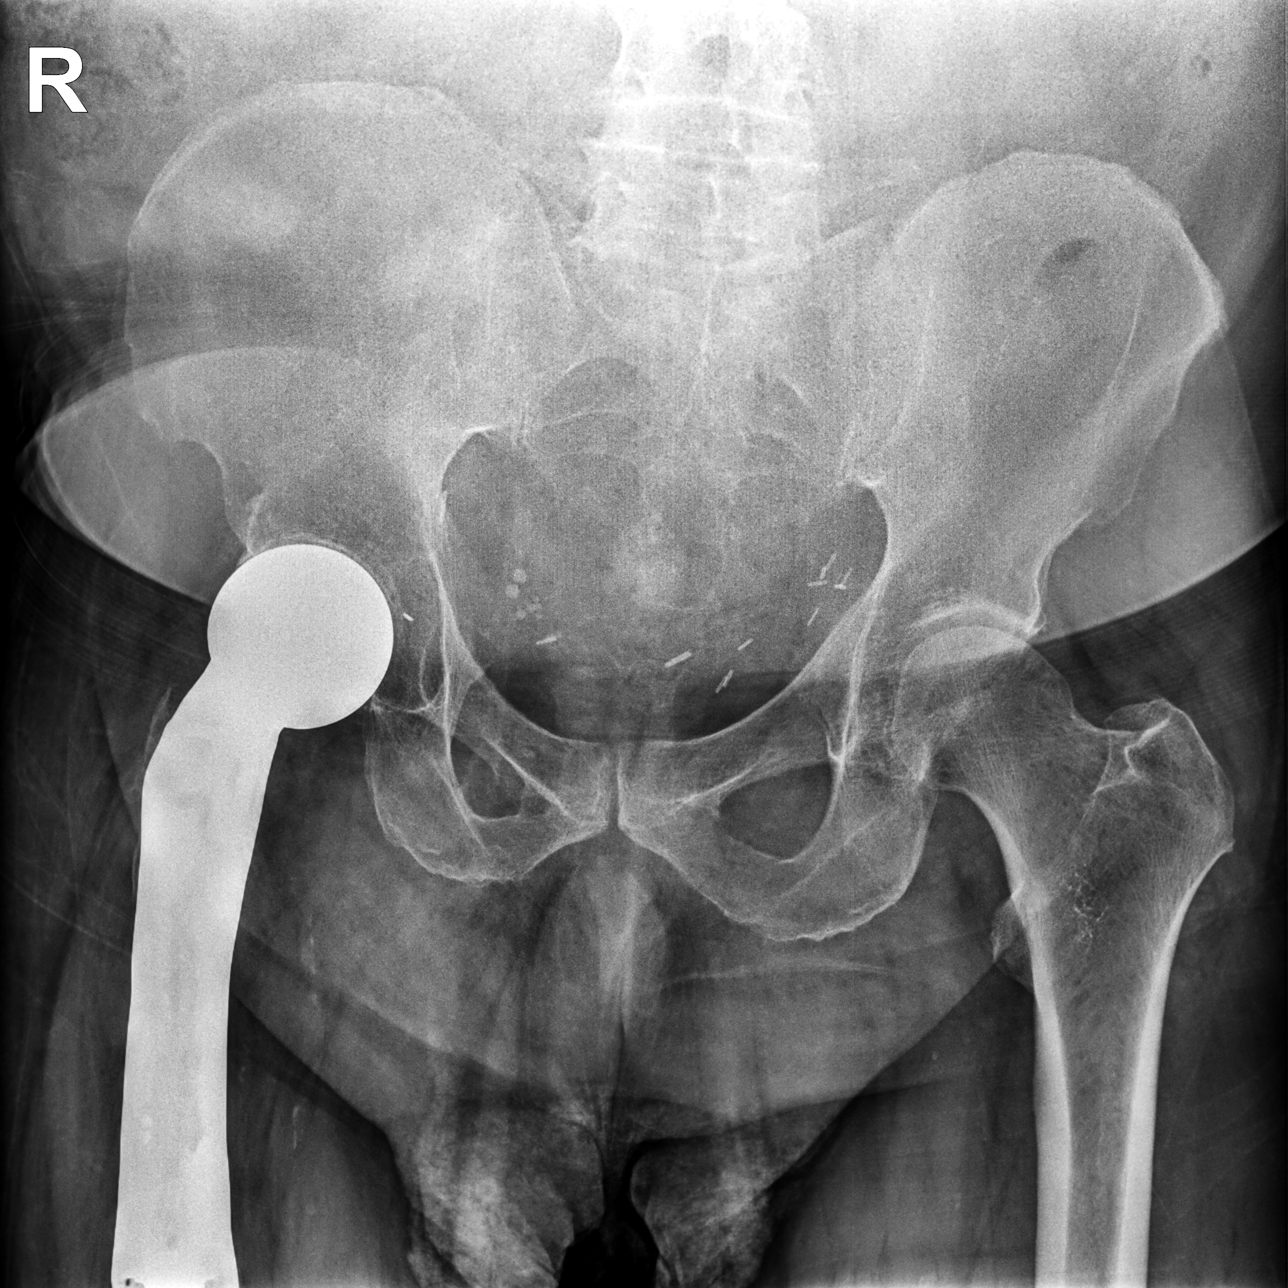

[hip joint ap]
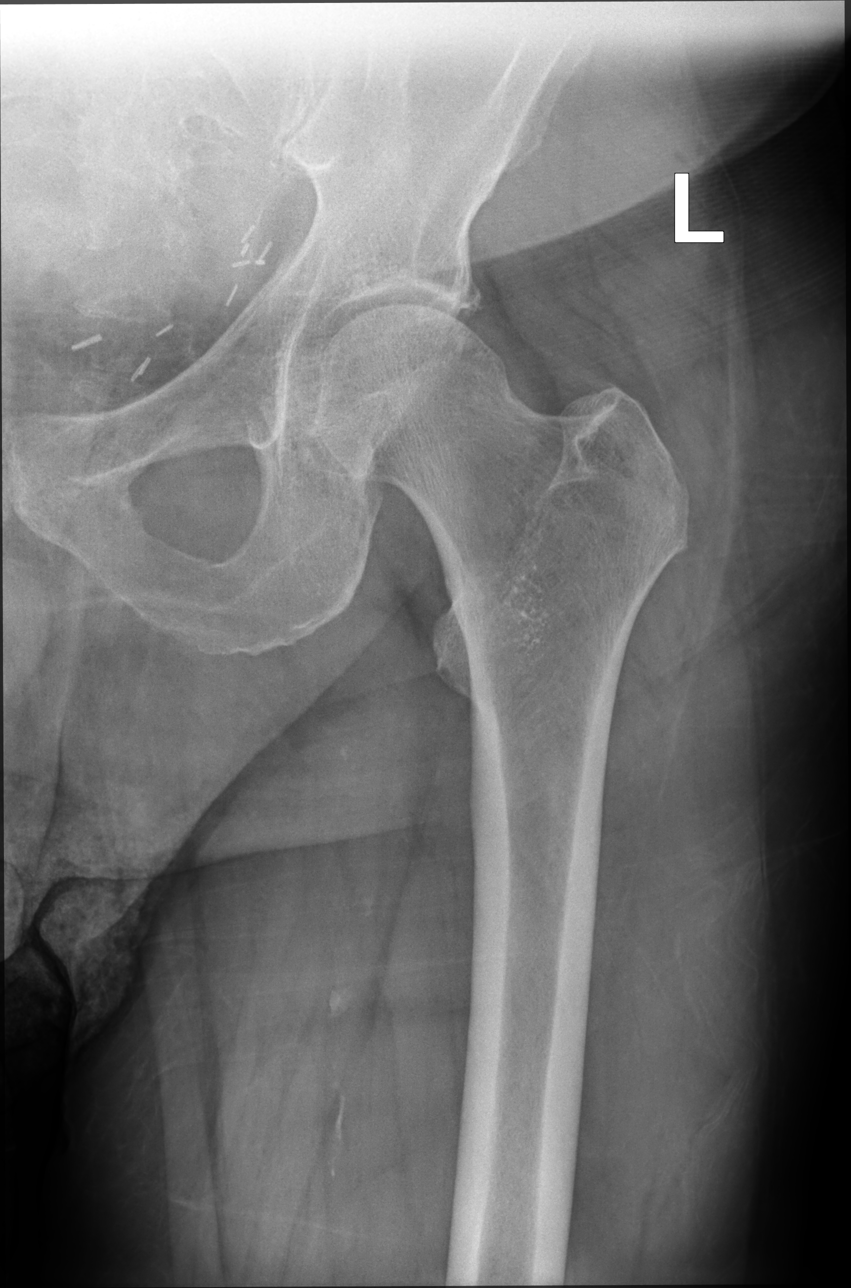

[hip joint (frog view)]
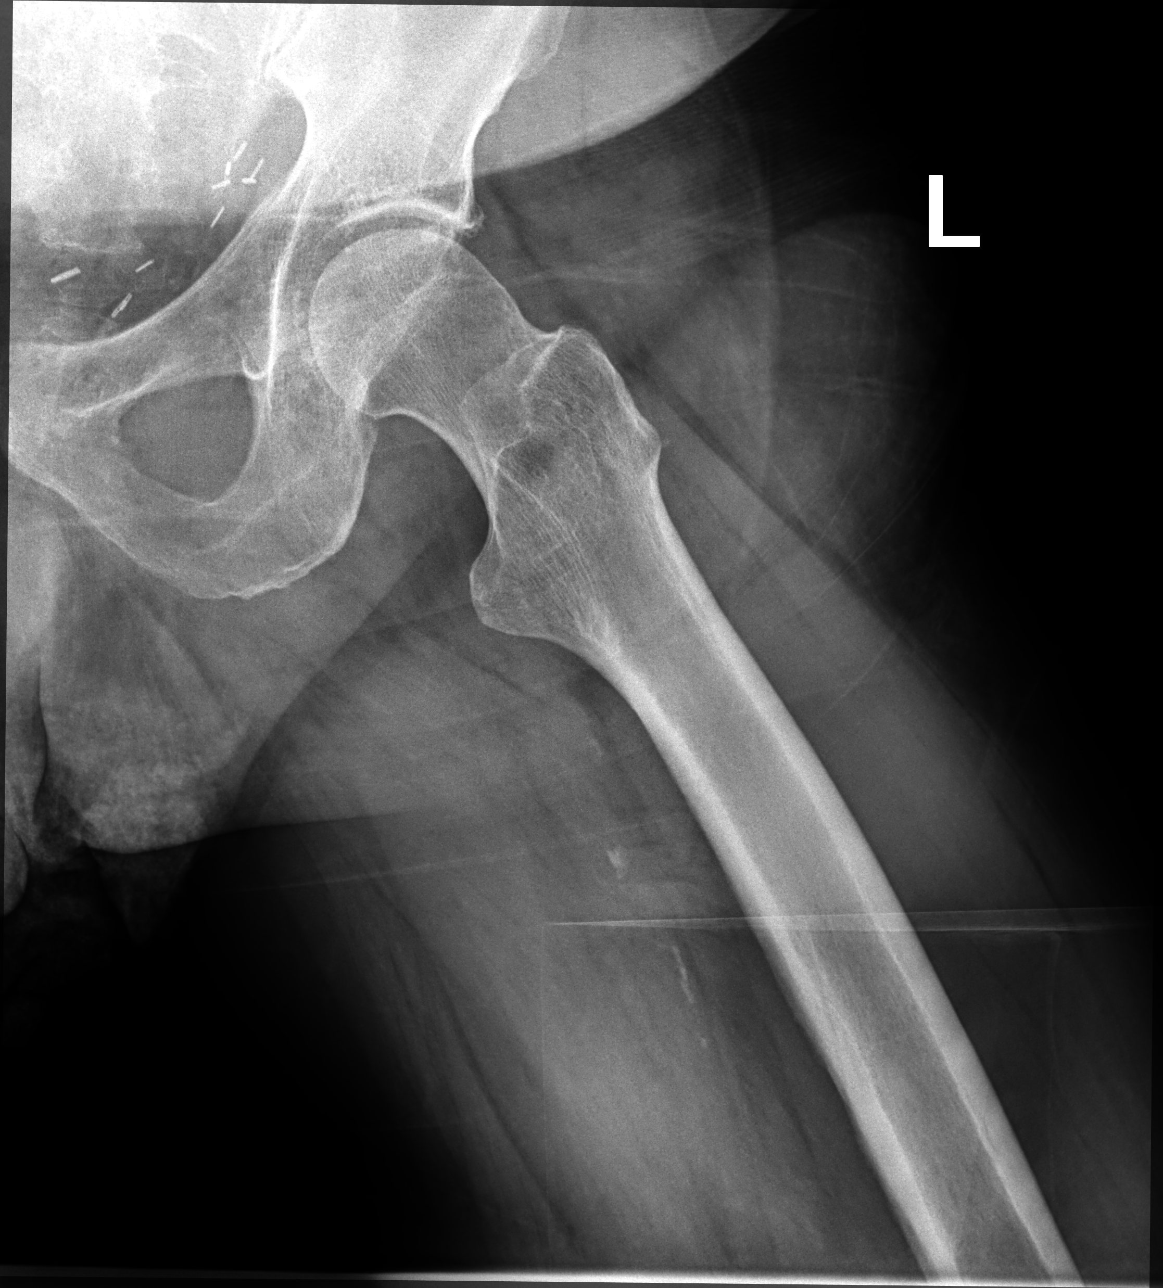

[3 of 3 positions shown; findings below may reference images not displayed]

FINDINGS: SI joints are non widened. Clips and phleboliths in the pelvis.
Right femoral prosthesis with normal alignment. Pubic symphysis and
rami are intact.

Left hip shows no fracture or malalignment. Small osseous bump at
the femoral head neck junction. Joint space is relatively
maintained.
IMPRESSION: 1. Status post right femoral hemiarthroplasty with normal alignment.
2. No acute osseous abnormality of left hip
3. Left hip joint space is relatively maintained. Small osseous bump
at the femoral head neck junction, can be seen with impingement.

## 2021-06-30 ENCOUNTER — Other Ambulatory Visit: Payer: Self-pay | Admitting: Family Medicine

## 2021-07-11 LAB — COMPREHENSIVE METABOLIC PANEL
Albumin: 3.1 — AB (ref 3.5–5.0)
Calcium: 8.6 — AB (ref 8.7–10.7)

## 2021-07-11 LAB — IRON,TIBC AND FERRITIN PANEL
Ferritin: 10.8
Iron: 47
TIBC: 332

## 2021-07-11 LAB — BASIC METABOLIC PANEL
CO2: 29 — AB (ref 13–22)
Chloride: 110 — AB (ref 99–108)
Creatinine: 1.5 — AB (ref 0.6–1.3)
Glucose: 101
Potassium: 4.2 (ref 3.4–5.3)
Sodium: 141 (ref 137–147)

## 2021-07-11 LAB — HEPATIC FUNCTION PANEL
ALT: 25 (ref 10–40)
AST: 22 (ref 14–40)
Alkaline Phosphatase: 80 (ref 25–125)
Bilirubin, Direct: 0.2 (ref 0.01–0.4)
Bilirubin, Total: 0.7

## 2021-07-11 LAB — VITAMIN B12: Vitamin B-12: 392

## 2021-07-11 LAB — CBC: RBC: 5.12 — AB (ref 3.87–5.11)

## 2021-07-12 ENCOUNTER — Ambulatory Visit (INDEPENDENT_AMBULATORY_CARE_PROVIDER_SITE_OTHER): Payer: Medicare Other | Admitting: Family Medicine

## 2021-07-12 ENCOUNTER — Other Ambulatory Visit: Payer: Self-pay

## 2021-07-12 ENCOUNTER — Encounter: Payer: Self-pay | Admitting: Family Medicine

## 2021-07-12 VITALS — BP 110/60 | HR 66 | Temp 98.4°F | Ht 71.0 in | Wt 231.0 lb

## 2021-07-12 DIAGNOSIS — M549 Dorsalgia, unspecified: Secondary | ICD-10-CM

## 2021-07-12 DIAGNOSIS — I2510 Atherosclerotic heart disease of native coronary artery without angina pectoris: Secondary | ICD-10-CM

## 2021-07-12 DIAGNOSIS — E785 Hyperlipidemia, unspecified: Secondary | ICD-10-CM

## 2021-07-12 DIAGNOSIS — I2782 Chronic pulmonary embolism: Secondary | ICD-10-CM

## 2021-07-12 DIAGNOSIS — I5032 Chronic diastolic (congestive) heart failure: Secondary | ICD-10-CM

## 2021-07-12 NOTE — Progress Notes (Signed)
Phone [phone] In person visit   Subjective:   Bruce Ball is a 75 y.o. year old very pleasant male patient who presents for/with See problem oriented charting Chief Complaint  Patient presents with   Diastolic CHF   Hypertension    This visit occurred during the SARS-CoV-2 public health emergency.  Safety protocols were in place, including screening questions prior to the visit, additional usage of staff PPE, and extensive cleaning of exam room while observing appropriate contact time as indicated for disinfecting solutions.   Past Medical History-  Patient Active Problem List   Diagnosis Date Noted   CAD (coronary artery disease) 04/03/2021    Priority: High   Diastolic CHF ([place]) 09/81/1914    Priority: High   Aortic atherosclerosis ([place]) 05/17/2020    Priority: High   Idiopathic neuropathy 01/28/2020    Priority: High   Recurrent falls 01/28/2020    Priority: High   Pulmonary emboli ([place]) 01/14/2020    Priority: High   High risk medication use 02/28/2017    Priority: High   Ataxia 05/18/2016    Priority: High   Vestibular disequilibrium 05/18/2016    Priority: High   Ankylosing spondylitis ([place]) 05/24/2014    Priority: High   B12 deficiency 05/17/2020    Priority: Medium   Iliac artery aneurysm ([place]) 05/17/2020    Priority: Medium   Osteoporosis 04/04/2020    Priority: Medium   Chronic kidney disease (CKD), stage III (moderate) ([place]) 01/19/2020    Priority: Medium   GERD (gastroesophageal reflux disease) 12/22/2019    Priority: Medium   Essential tremor 12/22/2019    Priority: Medium   Hyperlipidemia 11/11/2017    Priority: Medium   Right ureteral stone 11/05/2017    Priority: Medium   History of prostate cancer 03/08/2017    Priority: Medium   Hx of chondrosarcoma 02/28/2017    Priority: Medium   Peripheral edema 06/08/2012    Priority: Medium   Extrinsic asthma 04/07/2009    Priority: Medium   LOW BACK PAIN 04/21/2008    Priority: Medium    History of peptic ulcer disease 04/21/2008    Priority: Medium   Primary hypertension 10/29/2007    Priority: Medium   Chronic anticoagulation 03/26/2021    Priority: Low   Acquired coagulation disorder ([place]) 12/12/2020    Priority: Low   Senile purpura ([place]) 12/12/2020    Priority: Low   Femoral condyle fracture ([place]) 07/15/2019    Priority: Low   Community acquired pneumonia of right upper lobe of lung 11/05/2017    Priority: Low   History of total right hip replacement 03/08/2017    Priority: Low   DDD (degenerative disc disease), thoracic 02/28/2017    Priority: Low   Lung nodule, solitary 05/24/2014    Priority: Low   Other fatigue 06/05/2021   SOB (shortness of breath) on exertion 03/26/2021   Anemia in chronic kidney disease 01/25/2021   Thin skin 07/25/2020   Gross hematuria 06/14/2019    Medications- reviewed and updated Current Outpatient Medications  Medication Sig Dispense Refill   acetaminophen (TYLENOL) 325 MG tablet Take 2 tablets (650 mg total) by mouth every 6 (six) hours as needed for mild pain or moderate pain.     apixaban (ELIQUIS) 2.5 MG TABS tablet Take 1 tablet (2.5 mg total) by mouth 2 (two) times daily.     atorvastatin (LIPITOR) 20 MG tablet Take 20 mg at bedtime by mouth.      carvedilol (COREG) 25 MG tablet  Take 25 mg by mouth 2 (two) times daily.     Cholecalciferol (VITAMIN D3) 50 MCG (2000 UT) capsule Take 2,000 Units by mouth in the morning and at bedtime.      indapamide (LOZOL) 1.25 MG tablet Take 1.25 mg by mouth daily.     metoCLOPramide (REGLAN) 5 MG tablet TAKE 1 TABLET (5 MG TOTAL) BY MOUTH 3 (THREE) TIMES DAILY BEFORE MEALS. 90 tablet 1   No current facility-administered medications for this visit.     Objective:  BP 110/60 (BP Location: Left Arm, Patient Position: Sitting, Cuff Size: Large)   Pulse 66   Temp 98.4 F (36.9 C) (Temporal)   Ht 5\' 11"  (1.803 m)   Wt 231 lb (104.8 kg)   SpO2 96%   BMI 32.22 kg/m  Gen: NAD,  resting comfortably CV: RRR no murmurs rubs or gallops Lungs: CTAB no crackles, wheeze, rhonchi Ext: trace edema Skin: warm, dry Neuro: walks with walker, slow to rise and falls backwards after standing    Assessment and Plan   #obesity/exercise- down 9 lbs since last visit and 15 lbs total with healthy weigh to wellness. Working on eating healthier. Going to water classes at times.   #Low back pain increase/gait imbalance-he states back pain has flared up since being off of prednisone- wonders if PT may help. We placed a referral today. He continues to walk with walker. We are hoping maybe doing some core exercises would help with back pain -also on exam appeared unstable with standing- gait and balance training could be helpful as well -Patient states he may have to restart his prednisone due to level of pain  # Anemia- was told by [facility] doctor this was continuing to drop. Plan was GI referral if continuing to drop. They are going to get me a copy of these results- never got cologuard done so this would be very reasonable.  - tough balance here with history of pulmonary embolism thought provoked by sedentary activity- ideally would stop eliquis with worsening anemia but we may need hematology opinion Dr. Alvy Bimler and potentially even a bridge for colonoscopy if needed due to risk of recurrence (I think that is less likely that he will need this)  #Diastolic CHF S: Patient with aggressive diuresis in the hospital and was down at least 17 pounds. He is was on indapamide  and cardiology did not recommend use of Lasix regularly. His famil requested having Lasix available if he were to have weight gain or increased swelling in prior visits- has not needed lately- We opted to prescribe and only use if he had weight gain 3 lbs in a day or 5 lbs within a week or if had significant edema or SOB. Patient did agree to weigh daily  - A referral to nutrition was placed last visit to help with low-salt diet -  was not the most helpful- diet is hard trying to eat low salt and lose calories A/P: Stable. Continue current medications.   #Chronic kidney disease stage III- S:improvement on last labs with Cr down to 1.42 from 1.7. glad he has been able to avoid lasix.  Back to baseline- cutting down on sodium is helpful A/P: stable on most recent check - just done in June so doesn't need to recheck - did have microscopic hematuria in past but he does have chronic kidney stones- has not seen Dr. Tresa [name] recently. Last urine microscopic was normal thankfully  #hyperlipidemia #coronary artery calcium score of 152 so LDL goal  under 70- at goal #aortic atherosclerosis S: Medication: Atorvastatin 20 mg Lab Results  Component Value Date   CHOL 103 06/05/2021   HDL 38 (L) 06/05/2021   LDLCALC 52 06/05/2021   LDLDIRECT 145.4 06/27/2011   TRIG 57 06/05/2021   CHOLHDL 3 04/03/2021   A/P: Patient remains on atorvastatin 20 mg-excellent control and most recent check of lipids.  LDL below 70 as goal for coronary artery calcium findings and aortic atherosclerosis  #Chronic DVT/PE- continue chronic Eliquis 2.5 mg twice daily as recommended by hematology/oncology-[facility]-we discussed today pending this by oncology before stopping-anemia discussion could certainly play into this  Recommended follow up: No follow-ups on file. Future Appointments  Date Time Provider [place]  07/18/2021  3:40 PM Starlyn Skeans, MD [facility] None  07/19/2021  2:20 PM Sueanne Margarita, MD [facility]    Lab/Order associations:   ICD-10-CM   1. Back pain, unspecified back location, unspecified back pain laterality, unspecified chronicity  M54.9 Ambulatory referral to Physical Therapy    2. Chronic diastolic congestive heart failure (HCC)  I50.32     3. Other chronic pulmonary embolism without acute cor pulmonale (HCC)  I27.82     4. Hyperlipidemia, unspecified  hyperlipidemia type  E78.5       I,Harris Phan,acting as a scribe for [name], MD.,have documented all relevant documentation on the behalf of [name], MD,as directed by  [name], MD while in the presence of [name], MD.   I, [name], MD, have reviewed all documentation for this visit. The documentation on 07/12/21 for the exam, diagnosis, procedures, and orders are all accurate and complete.   Return precautions advised.  [name], MD

## 2021-07-12 NOTE — Patient Instructions (Addendum)
Please schedule a physical therapy visit at the front desk or call back  I do encourage you to try to at least drink 32 oz of water daily- would probably to 1.5-2 L per day with heart failure history  [facility] [name] [facility]- happy to help if need GI referral from [name]

## 2021-07-13 ENCOUNTER — Encounter: Payer: Self-pay | Admitting: Family Medicine

## 2021-07-13 LAB — CBC AND DIFFERENTIAL
HCT: 41 (ref 41–53)
Hemoglobin: 12.5 — AB (ref 13.5–17.5)
Platelets: 237 (ref 150–399)
WBC: 8

## 2021-07-13 LAB — LIPID PANEL
Cholesterol: 101 (ref 0–200)
HDL: 34 — AB (ref 35–70)
LDL Cholesterol: 50
Triglycerides: 84 (ref 40–160)

## 2021-07-16 ENCOUNTER — Telehealth: Payer: Self-pay

## 2021-07-16 NOTE — Telephone Encounter (Signed)
Wife called and left a message asking for appt for Bruce Ball. He has been having bleeding and was told he may need to stop Eliquis. They are wanting your input. See 7/14 office note from family medicine.

## 2021-07-16 NOTE — Telephone Encounter (Signed)
Called and scheduled appt at [facility] on 7/22 with Dr. Alvy Bimler. Instructed to arrive 20 mins early. Wife verbalized understanding.

## 2021-07-16 NOTE — Telephone Encounter (Signed)
I am overbooked next few days I can see him either 140 pm on Thursday or Friday morning Please call back and schedule an appointment

## 2021-07-18 ENCOUNTER — Encounter (INDEPENDENT_AMBULATORY_CARE_PROVIDER_SITE_OTHER): Payer: Self-pay | Admitting: Family Medicine

## 2021-07-18 ENCOUNTER — Ambulatory Visit (INDEPENDENT_AMBULATORY_CARE_PROVIDER_SITE_OTHER): Payer: Medicare Other | Admitting: Family Medicine

## 2021-07-18 ENCOUNTER — Other Ambulatory Visit: Payer: Self-pay

## 2021-07-18 VITALS — BP 127/77 | HR 66 | Temp 98.2°F | Ht 71.0 in | Wt 226.0 lb

## 2021-07-18 DIAGNOSIS — M549 Dorsalgia, unspecified: Secondary | ICD-10-CM | POA: Diagnosis not present

## 2021-07-18 DIAGNOSIS — G8929 Other chronic pain: Secondary | ICD-10-CM

## 2021-07-18 DIAGNOSIS — E669 Obesity, unspecified: Secondary | ICD-10-CM

## 2021-07-18 DIAGNOSIS — Z6832 Body mass index (BMI) 32.0-32.9, adult: Secondary | ICD-10-CM

## 2021-07-19 ENCOUNTER — Encounter: Payer: Self-pay | Admitting: Cardiology

## 2021-07-19 ENCOUNTER — Ambulatory Visit (INDEPENDENT_AMBULATORY_CARE_PROVIDER_SITE_OTHER): Payer: Medicare Other | Admitting: Cardiology

## 2021-07-19 VITALS — BP 140/76 | HR 64 | Ht 71.0 in | Wt 232.6 lb

## 2021-07-19 DIAGNOSIS — I5032 Chronic diastolic (congestive) heart failure: Secondary | ICD-10-CM

## 2021-07-19 DIAGNOSIS — R931 Abnormal findings on diagnostic imaging of heart and coronary circulation: Secondary | ICD-10-CM

## 2021-07-19 DIAGNOSIS — I2510 Atherosclerotic heart disease of native coronary artery without angina pectoris: Secondary | ICD-10-CM | POA: Diagnosis not present

## 2021-07-19 DIAGNOSIS — E7800 Pure hypercholesterolemia, unspecified: Secondary | ICD-10-CM | POA: Diagnosis not present

## 2021-07-19 DIAGNOSIS — I10 Essential (primary) hypertension: Secondary | ICD-10-CM | POA: Diagnosis not present

## 2021-07-19 DIAGNOSIS — I2782 Chronic pulmonary embolism: Secondary | ICD-10-CM | POA: Diagnosis not present

## 2021-07-19 DIAGNOSIS — I2609 Other pulmonary embolism with acute cor pulmonale: Secondary | ICD-10-CM | POA: Diagnosis not present

## 2021-07-19 DIAGNOSIS — N183 Chronic kidney disease, stage 3 unspecified: Secondary | ICD-10-CM

## 2021-07-19 MED ORDER — ATORVASTATIN CALCIUM 20 MG PO TABS: 20.0000 mg | ORAL_TABLET | Freq: Every day | ORAL | 3 refills | Status: AC

## 2021-07-19 NOTE — Addendum Note (Signed)
Addended by: Antonieta Iba on: 07/19/2021 02:56 PM   Modules accepted: Orders

## 2021-07-19 NOTE — Patient Instructions (Signed)

## 2021-07-19 NOTE — Progress Notes (Addendum)
Cardiology Office Note    Date:  07/19/2021   ID:  Bruce Ball, DOB [birth year], MRN [id number]   PCP:  [name], MD   [place]  Cardiologist:  Fransico Him, MD  Advanced Practice Provider:  No care team member to display Electrophysiologist:  None   [phone]   Chief Complaint  Patient presents with   Follow-up    Coronary artery calcifications, HLD, HTN, hx of PE, chronic diastolic CHF     History of Present Illness:  Bruce Ball is a 75 y.o. male with history of hypertension, PE on chronic anticoagulation with Eliquis.  Patient was seen by [name] in the hospital 03/28/2021 after developing lower extremity edema after 2400 mile road trip at the [place] and eating fast foods.  CT showed no PE but did have coronary artery calcification, echo hyperdynamic LVEF greater than 75% with moderate LVH and grade 1 DD trivial MR mild dilatation of the ascending aorta.  He was dx with acute diastolic CHF.  He was treated with Lasix 40 mg twice daily and diuresed 3.2 L and 16 pounds but creatinine bumped up.  Blood pressure was elevated and he was continued on carvedilol 25 mg twice daily.    He was seen back in followup in our office with Estella Husk, PA and was doing well.  He had a coronary Ca score done which showed a coronary Ca score of 152 in all 3 epicardial vessels which was 43rd% for age and sex matched controls. He also had extensive aortic arch and descending aortic atherosclerosis and AV leaflet calcifications.   He is here today for followup and is doing well.  He denies any chest pain or pressure, SOB, DOE, PND, orthopnea, LE edema, dizziness, palpitations or syncope. He is compliant with his meds and is tolerating meds with no SE.     Past Medical History:  Diagnosis Date   Ankylosing spondylitis ([place])    Arthritis of right knee    Basal cell carcinoma    Chronic back pain greater than 3 months duration    Chronic leg pain    CKD  (chronic kidney disease), stage II    Coronary artery calcification seen on CAT scan 03/27/2021   Deafness in right ear    Dyspnea    cause by PE   Essential tremor    Extrinsic asthma, unspecified 04/07/2009   PT DENIES   GERD (gastroesophageal reflux disease)    Hearing loss    Hip problem    History of kidney stones    HYPERTENSION 10/29/2007   HYPOTENSION 08/13/2010   LOW BACK PAIN 04/21/2008   Mild aortic stenosis    NEPHROLITHIASIS, HX OF 04/21/2008   Neuropathy    Osteopenia    Other hyperlipidemia    Peptic ulcer    Pneumonia    PUD, HX OF 04/21/2008   Pulmonary emboli ([place]) 2021   noted on CT 12-2019   Renal disorder    Stage 3 chronic kidney disease (HCC)    Thyroid nodule     Past Surgical History:  Procedure Laterality Date   BACK SURGERY     bone cancer  1970   rt femur removed and steel rod placed   [place], URETEROSCOPY AND STENT PLACEMENT Right 11/06/2017   Procedure: CYSTOSCOPY WITH  STENT PLACEMENT RIGHT;  Surgeon: Raynelle Bring, MD;  Location: [facility];  Service: Urology;  Laterality: Right;   CYSTOSCOPY WITH RETROGRADE PYELOGRAM,  URETEROSCOPY AND STENT PLACEMENT Left 06/09/2019   Procedure: CYSTOSCOPY WITH RETROGRADE PYELOGRAM, URETEROSCOPY AND STENT PLACEMENT X2;  Surgeon: [name], MD;  Location: [facility];  Service: Urology;  Laterality: Left;   CYSTOSCOPY WITH RETROGRADE PYELOGRAM, URETEROSCOPY AND STENT PLACEMENT Bilateral 03/01/2020   Procedure: CYSTOSCOPY WITH RETROGRADE PYELOGRAM, URETEROSCOPY AND STENT PLACEMENT;  Surgeon: [name], MD;  Location: [facility];  Service: Urology;  Laterality: Bilateral;  75 MINS   CYSTOSCOPY/RETROGRADE/URETEROSCOPY     1 year ago at [place] in [place] Right 11/24/2017   Procedure: CYSTOSCOPY/URETEROSCOPY/ RETROGRADE/STENT REMOVAL;  Surgeon: Raynelle Bring, MD;  Location: [facility];  Service: Urology;  Laterality: Right;   HOLMIUM LASER APPLICATION  Bilateral 05/01/9766   Procedure: HOLMIUM LASER APPLICATION;  Surgeon: [name], MD;  Location: [facility];  Service: Urology;  Laterality: Bilateral;   prosatectomy      Current Medications: Current Meds  Medication Sig   acetaminophen (TYLENOL) 325 MG tablet Take 2 tablets (650 mg total) by mouth every 6 (six) hours as needed for mild pain or moderate pain.   apixaban (ELIQUIS) 2.5 MG TABS tablet Take 1 tablet (2.5 mg total) by mouth 2 (two) times daily.   atorvastatin (LIPITOR) 20 MG tablet Take 20 mg at bedtime by mouth.    carvedilol (COREG) 25 MG tablet Take 25 mg by mouth 2 (two) times daily.   Cholecalciferol (VITAMIN D3) 50 MCG (2000 UT) capsule Take 2,000 Units by mouth in the morning and at bedtime.    indapamide (LOZOL) 1.25 MG tablet Take 1.25 mg by mouth daily.   metoCLOPramide (REGLAN) 5 MG tablet TAKE 1 TABLET (5 MG TOTAL) BY MOUTH 3 (THREE) TIMES DAILY BEFORE MEALS.     Allergies:   Patient has no known allergies.   Social History   Socioeconomic History   Marital status: Married    Spouse name: Not on file   Number of children: 2   Years of education: Not on file   Highest education level: Not on file  Occupational History   Occupation: Retired Optometrist  Tobacco Use   Smoking status: Former    Packs/day: 1.00    Years: 17.00    Pack years: 17.00    Types: Cigarettes    Start date: 1963    Quit date: 12/30/1978    Years since quitting: 42.5   Smokeless tobacco: Never  Vaping Use   Vaping Use: Never used  Substance and Sexual Activity   Alcohol use: No   Drug use: No   Sexual activity: Not on file  Other Topics Concern   Not on file  Social History Narrative   Not on file   Social Determinants of Health   Financial Resource Strain: Not on file  Food Insecurity: No Food Insecurity   Worried About Charity fundraiser in the Last Year: Never true   Ran Out of Food in the Last Year: Never true  Transportation Needs: Not on file  Physical Activity:  Not on file  Stress: Not on file  Social Connections: Not on file     Family History:  The patient's family history includes COPD in his father; Cancer in an other family member; Hypertension in his father; Prostate cancer in his brother.   ROS:   Please see the history of present illness.    ROS All other systems reviewed and are negative.   PHYSICAL EXAM:   VS:  BP 140/76   Pulse 64   Ht 5'  11" (1.803 m)   Wt 232 lb 9.6 oz (105.5 kg)   SpO2 96%   BMI 32.44 kg/m   Physical Exam  GEN: Well nourished, well developed in no acute distress HEENT: Normal NECK: No JVD; No carotid bruits LYMPHATICS: No lymphadenopathy CARDIAC:RRR, no murmurs, rubs, gallops RESPIRATORY:  Clear to auscultation without rales, wheezing or rhonchi  ABDOMEN: Soft, non-tender, non-distended MUSCULOSKELETAL:  No edema; No deformity  SKIN: Warm and dry NEUROLOGIC:  Alert and oriented x 3 PSYCHIATRIC:  Normal affect   Wt Readings from Last 3 Encounters:  07/19/21 232 lb 9.6 oz (105.5 kg)  07/18/21 226 lb (102.5 kg)  07/12/21 231 lb (104.8 kg)      Studies/Labs Reviewed:   EKG:  EKG is not ordered today.   Recent Labs: 03/26/2021: B Natriuretic Peptide 107.3 03/29/2021: Magnesium 2.0 06/05/2021: BUN 22; TSH 1.930 07/11/2021: ALT 25; Creatinine 1.5; Hemoglobin 12.5; Platelets 237; Potassium 4.2; Sodium 141   Lipid Panel    Component Value Date/Time   CHOL 101 07/11/2021 0000   CHOL 103 06/05/2021 0935   TRIG 84 07/11/2021 0000   HDL 34 (A) 07/11/2021 0000   HDL 38 (L) 06/05/2021 0935   CHOLHDL 3 04/03/2021 1637   VLDL 19.2 04/03/2021 1637   LDLCALC 50 07/11/2021 0000   LDLCALC 52 06/05/2021 0935   LDLDIRECT 145.4 06/27/2011 1124    Additional studies/ records that were reviewed today include:  ECHO 03/27/21 IMPRESSIONS     1. Intracavitary gradient. Peak velocity 2.07 m/s. Peak gradient 17 mmHg.  Left ventricular ejection fraction, by estimation, is >75%. Left  ventricular ejection  fraction by 2D MOD biplane is 76.9 %. Left  ventricular ejection fraction by PLAX is 76 %. The   left ventricle has hyperdynamic function. The left ventricle has no  regional wall motion abnormalities. There is moderate concentric left  ventricular hypertrophy. Left ventricular diastolic parameters are  consistent with Grade I diastolic dysfunction  (impaired relaxation).   2. Right ventricular systolic function is normal. The right ventricular  size is normal. There is normal pulmonary artery systolic pressure.   3. The mitral valve is normal in structure. Trivial mitral valve  regurgitation. No evidence of mitral stenosis.   4. The aortic valve is calcified. There is mild calcification of the  aortic valve. There is mild thickening of the aortic valve. Aortic valve  regurgitation is not visualized. Mild aortic valve sclerosis is present,  with no evidence of aortic valve  stenosis.   5. Aortic dilatation noted. There is mild dilatation of the ascending  aorta, measuring 39 mm.   6. The inferior vena cava is normal in size with greater than 50%  respiratory variability, suggesting right atrial pressure of 3 mmHg.   FINDINGS   Left Ventricle: Intracavitary gradient. Peak velocity 2.07 m/s. Peak  gradient 17 mmHg. Left ventricular ejection fraction, by estimation, is  >75%. Left ventricular ejection fraction by PLAX is 76 %. Left ventricular  ejection fraction by 2D MOD biplane  is 76.9 %. The left ventricle has hyperdynamic function. The left  ventricle has no regional wall motion abnormalities. The left ventricular  internal cavity size was normal in size. There is moderate concentric left  ventricular hypertrophy. Left  ventricular diastolic parameters are consistent with Grade I diastolic  dysfunction (impaired relaxation). Normal left ventricular filling  pressure.   Right Ventricle: The right ventricular size is normal. No increase in  right ventricular wall thickness. Right  ventricular systolic function is  normal. There is normal pulmonary artery systolic pressure. The tricuspid  regurgitant velocity is 2.10 m/s, and   with an assumed right atrial pressure of 3 mmHg, the estimated right  ventricular systolic pressure is 03.5 mmHg.   Left Atrium: Left atrial size was normal in size.   Right Atrium: Right atrial size was normal in size.   Pericardium: Trivial pericardial effusion is present.   Mitral Valve: The mitral valve is normal in structure. Trivial mitral  valve regurgitation. No evidence of mitral valve stenosis.   Tricuspid Valve: The tricuspid valve is normal in structure. Tricuspid  valve regurgitation is trivial. No evidence of tricuspid stenosis.   Aortic Valve: The aortic valve is calcified. There is mild calcification  of the aortic valve. There is mild thickening of the aortic valve. Aortic  valve regurgitation is not visualized. Mild aortic valve sclerosis is  present, with no evidence of aortic  valve stenosis.   Pulmonic Valve: The pulmonic valve was normal in structure. Pulmonic valve  regurgitation is not visualized. No evidence of pulmonic stenosis.   Aorta: Aortic dilatation noted. There is mild dilatation of the ascending  aorta, measuring 39 mm.   Venous: The inferior vena cava is normal in size with greater than 50%  respiratory variability, suggesting right atrial pressure of 3 mmHg.   IAS/Shunts: No atrial level shunt detected by color flow Doppler.      Echo 01/14/20 IMPRESSIONS     1. Left ventricular ejection fraction, by visual estimation, is 60 to  65%. The left ventricle has normal function. There is moderately increased  left ventricular hypertrophy.   2. Moderate asymmetric hypertrophy of the basal septum measuring 85mm  (posterior wall 93mm)   3. The mitral valve is normal in structure. No evidence of mitral valve  regurgitation.   4. The aortic valve is tricuspid. Aortic valve regurgitation is trivial.   Mild aortic valve sclerosis without stenosis.   5. The tricuspid valve is grossly normal.   6. The pulmonic valve was not well visualized. Pulmonic valve  regurgitation is trivial.   7. Global right ventricle has normal systolic function.The right  ventricular size is normal.   8. Left atrial size was normal.   9. Right atrial size was normal.  10. TR signal is inadequate for assessing pulmonary artery systolic  pressure.     ASSESSMENT:    1. Chronic diastolic congestive heart failure ([place])   2. Primary hypertension   3. Agatston coronary artery calcium score between 100 and 199   4. Other chronic pulmonary embolism with acute cor pulmonale (HCC)   5. Pure hypercholesterolemia   6. Stage 3 chronic kidney disease, unspecified whether stage 3a or 3b CKD (HCC)      PLAN:  In order of problems listed above:  Chronic diastolic CHF -Echo 0/0938 showed hyperdynamic LVEF greater than 75% with moderate LVH and grade 1 DD.   -he appears euvolemic on exam today -he is only using Lasix PRN -continue Low Na diet  Hypertension  -BP adequately controlled on exam today -Continue prescription drug management with Carvedilol 25mg  BID, Indapamide 1.25mg  daily with PRN refills   Coronary calcifications on CT -coronary Ca score elevated at 152 -he denies any anginal sx -Continue prescription drug management with statin and BB   History of pulmonary embolus  -on chronic anticoagulation with Eliquis  HLD -LDL goal < 70 -I have personally reviewed and interpreted outside labs performed by patient's PCP which showed LDL 50, HDL 34,  ALT 25 in July 2022 -Continue prescription drug management with Atorvastatin 20mg  daily >refilled for 1 year    CKD stage 3  -followed by Nephrology   Shared Decision Making/Informed Consent        Medication Adjustments/Labs and Tests Ordered: Current medicines are reviewed at length with the patient today.  Concerns regarding medicines are outlined  above.  Medication changes, Labs and Tests ordered today are listed in the Patient Instructions below. There are no Patient Instructions on file for this visit.    Signed, Fransico Him, MD  07/19/2021 2:49 PM    [address], [place], [place]  [address] Phone: [phone]; Fax: [phone]

## 2021-07-20 ENCOUNTER — Encounter: Payer: Self-pay | Admitting: Hematology and Oncology

## 2021-07-20 ENCOUNTER — Other Ambulatory Visit: Payer: Self-pay

## 2021-07-20 ENCOUNTER — Inpatient Hospital Stay: Payer: Medicare Other | Attending: Hematology and Oncology | Admitting: Hematology and Oncology

## 2021-07-20 DIAGNOSIS — I2699 Other pulmonary embolism without acute cor pulmonale: Secondary | ICD-10-CM | POA: Insufficient documentation

## 2021-07-20 DIAGNOSIS — R5383 Other fatigue: Secondary | ICD-10-CM

## 2021-07-20 DIAGNOSIS — I2609 Other pulmonary embolism with acute cor pulmonale: Secondary | ICD-10-CM

## 2021-07-20 DIAGNOSIS — Z9221 Personal history of antineoplastic chemotherapy: Secondary | ICD-10-CM | POA: Diagnosis not present

## 2021-07-20 DIAGNOSIS — Z86718 Personal history of other venous thrombosis and embolism: Secondary | ICD-10-CM | POA: Insufficient documentation

## 2021-07-20 DIAGNOSIS — D509 Iron deficiency anemia, unspecified: Secondary | ICD-10-CM | POA: Diagnosis not present

## 2021-07-20 DIAGNOSIS — Z9079 Acquired absence of other genital organ(s): Secondary | ICD-10-CM | POA: Insufficient documentation

## 2021-07-20 DIAGNOSIS — Z8546 Personal history of malignant neoplasm of prostate: Secondary | ICD-10-CM | POA: Insufficient documentation

## 2021-07-20 DIAGNOSIS — M450 Ankylosing spondylitis of multiple sites in spine: Secondary | ICD-10-CM

## 2021-07-20 DIAGNOSIS — I2782 Chronic pulmonary embolism: Secondary | ICD-10-CM

## 2021-07-20 DIAGNOSIS — Z7901 Long term (current) use of anticoagulants: Secondary | ICD-10-CM | POA: Diagnosis not present

## 2021-07-20 DIAGNOSIS — Z923 Personal history of irradiation: Secondary | ICD-10-CM | POA: Insufficient documentation

## 2021-07-20 DIAGNOSIS — M549 Dorsalgia, unspecified: Secondary | ICD-10-CM | POA: Diagnosis not present

## 2021-07-20 DIAGNOSIS — M8580 Other specified disorders of bone density and structure, unspecified site: Secondary | ICD-10-CM | POA: Insufficient documentation

## 2021-07-20 NOTE — Assessment & Plan Note (Signed)
He has profound, excessive fatigue I suspect it is related to his weight and sedentary lifestyle The patient also have interrupted sleep and take naps during daytime I told the patient and his wife that he has borderline mild anemia is not likely to be the cause of his excessive fatigue

## 2021-07-20 NOTE — Progress Notes (Signed)
Bruce Ball OFFICE PROGRESS NOTE  Bruce Olp, MD  ASSESSMENT & PLAN:  Pulmonary emboli [facility]) We had extensive discussion previously regarding the role of extended duration anticoagulation therapy Right now, the patient has completed a year's worth of anticoagulation therapy The patient has multiple risk factors for recurrent thromboembolism, given his poor mobility, sedentary lifestyle, obesity and cardiovascular risk factors He denies recent falls From last year's discussion, we are in agreement to continue anticoagulation therapy at reduced dose of 2.5 mg twice daily indefinitely The patient understood that the purpose of treatment is for secondary prevention His recent blood work showed microcytic anemia, confirmed with iron deficiency In the absence of obvious bleeding, it is likely due to GI blood loss He is scheduled to see gastroenterologist for evaluation I recommend we proceed with EGD and colonoscopy to rule out obvious source of bleeding We have extensive discussion about the risk and benefits again today for continuing anticoagulation therapy For now, I recommend he continues taking Eliquis as prescribed He is informed to call me after EGD and colonoscopy results are available If no obvious source of bleeding is found, I would recommend him to take oral iron supplement indefinitely My preference would still be for him to continue on anticoagulation therapy if possible  Ankylosing spondylitis ([place]) He has diffuse musculoskeletal joint pain and is off prednisone He has taken occasional ibuprofen for joint pain which helps He desires going back on prednisone if possible but there is risk of gastritis I recommend he hold off taking NSAID until further GI evaluation  Other fatigue He has profound, excessive fatigue I suspect it is related to his weight and sedentary lifestyle The patient also have interrupted sleep and take naps during daytime I told the  patient and his wife that he has borderline mild anemia is not likely to be the cause of his excessive fatigue  No orders of the defined types were placed in this encounter.   The total time spent in the appointment was 20 minutes encounter with patients including review of chart and various tests results, discussions about plan of care and coordination of care plan   All questions were answered. The patient knows to call the clinic with any problems, questions or concerns. No barriers to learning was detected.    Bruce Lark, MD 7/22/202210:34 AM  INTERVAL HISTORY: Bruce Ball 75 y.o. male returns for further evaluation in regards to chronic anticoagulation therapy Since last time I saw him, he follows closely at the [place] He was found to have microcytic anemia, confirm iron deficiency His [place] physician is wondering whether he should stop his anticoagulation therapy He has been referred to see gastroenterologist The patient denies any recent signs or symptoms of bleeding such as spontaneous epistaxis, hematuria or hematochezia. 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 Evidence of right heart strain (RV/LV Ratio = 1.06) consistent with at least submassive (intermediate risk) PE. The presence of right heart strain has been associated with an increased risk of morbidity and mortality. Coronary artery disease. Aortic Atherosclerosis (ICD10-I70.0).  On January 15, venous Doppler ultrasound showed Right: There is no evidence of deep vein thrombosis in the lower extremity. No cystic structure found in the popliteal fossa.  Left: Findings consistent with acute deep vein thrombosis involving the left peroneal veins. No cystic structure found in the popliteal fossa.   Echocardiogram report showed IMPRESSIONS     1. Left ventricular ejection fraction, by visual estimation, is 60 to 65%. The left ventricle has normal function. There is moderately increased left ventricular hypertrophy.  2. Moderate asymmetric hypertrophy of the basal septum measuring 55m (posterior wall 191m  3. The mitral valve is normal in structure. No evidence of mitral valve regurgitation.  4. The aortic valve is tricuspid. Aortic valve regurgitation is trivial. Mild aortic valve sclerosis without stenosis.  5. The tricuspid valve is grossly normal.  6. The pulmonic valve was not well visualized. Pulmonic valve regurgitation is trivial.  7. Global right ventricle has normal systolic function.The right ventricular size is normal.  8. Left atrial size was normal.  9. Right atrial size was normal. 10. TR signal is inadequate for assessing pulmonary artery systolic pressure  He was anticoagulated and was subsequently discharged He had history of recurrent falls, the last fall was over a month ago According to the patient, after he landed at the top of the stairs, his legs buckled with some associated dizziness but he did not  sustain major injury A few months back, he also tripped and fell in his yard According to the patient, he has pre-existing peripheral neuropathy affecting both toes and the bottom of his feet of unknown etiology His wife stated he walks with a shuffle He had history of B12 deficiency requiring vitamin B12 supplementation but according to the [facility] [place], he was told that his B12 level has been good and he has stopped receiving B12 injections He has never been formally evaluated for the cause of his falls or causes of neuropathy by a neurologist On review of outside records, he was seen by a neurologist several years ago for headache but he did not recall that visit His mobility is poor He has chronic bilateral lower extremity edema for some time When he presented to the emergency room, he denies calf pain but he does have bilateral anterior shin pain that is tender to touch for some time He has chronic back pain requiring neurosurgery and have undergone extensive treatment including surgery, implantation of stimulator and removal, extensive physical therapy and rehab for chronic back pain He denies recent history of preceding trauma but did have falls Denies long distance travel, recent surgery, or smoking. He had remote history of smoking He had prior surgeries before and never had perioperative thromboembolic events. The patient had not been exposed on testosterone replacement therapy  No family history of blood clots So far, he tolerated anticoagulation therapy well without bleeding complications.  On 04/02/2020, he had CT chest, abdomen and pelvis  1. No CT evidence of  acute traumatic injury to the chest, abdomen, or pelvis. 2. There is new sclerosis of the right sacral ala when compared to prior examination dated 02/14/2020, likely a subacute insufficiency fracture. 3.  Osteopenia. 4. Aortic Atherosclerosis (ICD10-I70.0). Unchanged focal aneurysm from the anterior aspect of the left common  iliac artery measuring approximately 1.0 cm. 5.  Extensive bilateral nonobstructive nephrolithiasis.   Starting January 26, 2021, the patient is recommended to reduce Eliquis to 2.5 mg twice a day, duration of treatment lifelong  I have reviewed the past medical history, past surgical history, social history and family history with the patient and they are unchanged from previous note.  ALLERGIES:  has No Known Allergies.  MEDICATIONS:  Current Outpatient Medications  Medication Sig Dispense Refill   acetaminophen (TYLENOL) 325 MG tablet Take 2 tablets (650 mg total) by mouth every 6 (six) hours as needed for mild pain or moderate pain.     apixaban (ELIQUIS) 2.5 MG TABS tablet Take 1 tablet (2.5 mg total) by mouth 2 (two) times daily.     atorvastatin (LIPITOR) 20 MG tablet Take 1 tablet (20 mg total) by mouth at bedtime. 90 tablet 3   carvedilol (COREG) 25 MG tablet Take 25 mg by mouth 2 (two) times daily.     Cholecalciferol (VITAMIN D3) 50 MCG (2000 UT) capsule Take 2,000 Units by mouth in the morning and at bedtime.      indapamide (LOZOL) 1.25 MG tablet Take 1.25 mg by mouth daily.     metoCLOPramide (REGLAN) 5 MG tablet TAKE 1 TABLET (5 MG TOTAL) BY MOUTH 3 (THREE) TIMES DAILY BEFORE MEALS. 90 tablet 1   No current facility-administered medications for this visit.     REVIEW OF SYSTEMS:   Constitutional: Denies fevers, chills or night sweats Eyes: Denies blurriness of vision Ears, nose, mouth, throat, and face: Denies mucositis or sore throat Respiratory: Denies cough, dyspnea or wheezes Cardiovascular: Denies palpitation, chest discomfort or lower extremity swelling Gastrointestinal:  Denies nausea, heartburn or change in bowel habits Skin: Denies abnormal skin rashes Lymphatics: Denies new lymphadenopathy or easy bruising Neurological:Denies numbness, tingling or new weaknesses Behavioral/Psych: Mood is stable, no new changes  All other systems were reviewed with the  patient and are negative.  PHYSICAL EXAMINATION: ECOG PERFORMANCE STATUS: 2 - Symptomatic, <50% confined to bed  Vitals:   07/20/21 0935  BP: 128/61  Pulse: (!) 58  Resp: 18  Temp: 98.4 F (36.9 C)  SpO2: 98%   Filed Weights   07/20/21 0935  Weight: 232 lb (105.2 kg)    GENERAL:alert, no distress and comfortable SKIN: Noted skin bruises NEURO: alert & oriented x 3 with fluent speech, no focal motor/sensory deficits  LABORATORY DATA:  I have reviewed the data as listed     Component Value Date/Time   NA 141 07/11/2021 0000   K 4.2 07/11/2021 0000   CL 110 (A) 07/11/2021 0000   CO2 29 (A) 07/11/2021 0000   GLUCOSE 98 06/05/2021 0935   GLUCOSE 108 (H) 04/03/2021 1637   BUN 22 06/05/2021 0935   CREATININE 1.5 (A) 07/11/2021 0000   CREATININE 1.42 (H) 06/05/2021 0935   CREATININE 1.67 (H) 01/25/2021 1125   CALCIUM 8.6 (A) 07/11/2021 0000   PROT 6.2 06/05/2021 0935   ALBUMIN 3.1 (A) 07/11/2021 0000   ALBUMIN 3.8 06/05/2021 0935   AST 22 07/11/2021 0000   ALT 25 07/11/2021 0000   ALKPHOS 80 07/11/2021 0000   BILITOT 0.4 06/05/2021 0935   GFRNONAA 47 (  L) 03/29/2021 0447   GFRNONAA 43 (L) 01/25/2021 1125   GFRAA 56 (L) 07/25/2020 1154    No results found for: SPEP, UPEP  Lab Results  Component Value Date   WBC 8.0 07/11/2021   NEUTROABS 6.20 05/11/2021   HGB 12.5 (A) 07/11/2021   HCT 41 07/11/2021   MCV 74.0 (L) 04/03/2021   PLT 237 07/11/2021      Chemistry      Component Value Date/Time   NA 141 07/11/2021 0000   K 4.2 07/11/2021 0000   CL 110 (A) 07/11/2021 0000   CO2 29 (A) 07/11/2021 0000   BUN 22 06/05/2021 0935   CREATININE 1.5 (A) 07/11/2021 0000   CREATININE 1.42 (H) 06/05/2021 0935   CREATININE 1.67 (H) 01/25/2021 1125   GLU 101 07/11/2021 0000      Component Value Date/Time   CALCIUM 8.6 (A) 07/11/2021 0000   ALKPHOS 80 07/11/2021 0000   AST 22 07/11/2021 0000   ALT 25 07/11/2021 0000   BILITOT 0.4 06/05/2021 0935

## 2021-07-20 NOTE — Assessment & Plan Note (Addendum)
We had extensive discussion previously regarding the role of extended duration anticoagulation therapy Right now, the patient has completed a year's worth of anticoagulation therapy The patient has multiple risk factors for recurrent thromboembolism, given his poor mobility, sedentary lifestyle, obesity and cardiovascular risk factors He denies recent falls From last year's discussion, we are in agreement to continue anticoagulation therapy at reduced dose of 2.5 mg twice daily indefinitely The patient understood that the purpose of treatment is for secondary prevention His recent blood work showed microcytic anemia, confirmed with iron deficiency In the absence of obvious bleeding, it is likely due to GI blood loss He is scheduled to see gastroenterologist for evaluation I recommend we proceed with EGD and colonoscopy to rule out obvious source of bleeding We have extensive discussion about the risk and benefits again today for continuing anticoagulation therapy For now, I recommend he continues taking Eliquis as prescribed He is informed to call me after EGD and colonoscopy results are available If no obvious source of bleeding is found, I would recommend him to take oral iron supplement indefinitely My preference would still be for him to continue on anticoagulation therapy if possible

## 2021-07-20 NOTE — Assessment & Plan Note (Signed)
He has diffuse musculoskeletal joint pain and is off prednisone He has taken occasional ibuprofen for joint pain which helps He desires going back on prednisone if possible but there is risk of gastritis I recommend he hold off taking NSAID until further GI evaluation

## 2021-07-23 ENCOUNTER — Encounter: Payer: Self-pay | Admitting: Family Medicine

## 2021-07-23 ENCOUNTER — Telehealth: Payer: Self-pay

## 2021-07-23 NOTE — Telephone Encounter (Signed)
-----   Message from [name], MD sent at 07/23/2021  8:52 AM EDT ----- Pls call his wife I cannot recommend management for his joint pain Looks like he has ankylosing spondylitis, complicated joint problem He should really reach out to his primary care or rheumatologist

## 2021-07-23 NOTE — Telephone Encounter (Signed)
Called and given below message to wife. She verbalized understanding and will contact PCP or rheumatologist.

## 2021-07-24 ENCOUNTER — Ambulatory Visit: Payer: Medicare Other | Admitting: Physical Therapy

## 2021-07-24 ENCOUNTER — Telehealth (INDEPENDENT_AMBULATORY_CARE_PROVIDER_SITE_OTHER): Payer: Medicare Other | Admitting: Family Medicine

## 2021-07-24 ENCOUNTER — Encounter: Payer: Self-pay | Admitting: Family Medicine

## 2021-07-24 VITALS — Temp 97.7°F | Wt 228.0 lb

## 2021-07-24 DIAGNOSIS — I2510 Atherosclerotic heart disease of native coronary artery without angina pectoris: Secondary | ICD-10-CM

## 2021-07-24 DIAGNOSIS — R0981 Nasal congestion: Secondary | ICD-10-CM | POA: Diagnosis not present

## 2021-07-24 NOTE — Progress Notes (Signed)
Virtual Visit via Video Note  I connected with Bruce Ball  on 07/24/21 at  4:40 PM EDT by a video enabled telemedicine application and verified that I am speaking with the correct person using two identifiers.  Location patient: home, [place] Location provider:work or home office Persons participating in the virtual visit: patient, provider  I discussed the limitations of evaluation and management by telemedicine and the availability of in person appointments. The patient expressed understanding and agreed to proceed.   HPI:  Acute telemedicine visit for : -Onset: about 1 week ago -Symptoms include:nasal congestion, watery eyes, itchy eyes, some sneezing -Denies:fever, SOB, Cp, NVD, inability to eat/drink/get out of bed, known sick contacts -Has tried: advil -Pertinent past medical history:see below -Pertinent medication allergies: No Known Allergies -COVID-19 vaccine status: vaccinated x2 and boosted  ROS: See pertinent positives and negatives per HPI.  Past Medical History:  Diagnosis Date   Ankylosing spondylitis ([place])    Arthritis of right knee    Basal cell carcinoma    Chronic back pain greater than 3 months duration    Chronic leg pain    CKD (chronic kidney disease), stage II    Coronary artery calcification seen on CAT scan 03/27/2021   Deafness in right ear    Dyspnea    cause by PE   Essential tremor    Extrinsic asthma, unspecified 04/07/2009   PT DENIES   GERD (gastroesophageal reflux disease)    Hearing loss    Hip problem    History of kidney stones    HYPERTENSION 10/29/2007   HYPOTENSION 08/13/2010   LOW BACK PAIN 04/21/2008   Mild aortic stenosis    NEPHROLITHIASIS, HX OF 04/21/2008   Neuropathy    Osteopenia    Other hyperlipidemia    Peptic ulcer    Pneumonia    PUD, HX OF 04/21/2008   Pulmonary emboli ([place]) 2021   noted on CT 12-2019   Renal disorder    Stage 3 chronic kidney disease (HCC)    Thyroid nodule     Past Surgical History:  Procedure  Laterality Date   BACK SURGERY     bone cancer  1970   rt femur removed and steel rod placed   [place], URETEROSCOPY AND STENT PLACEMENT Right 11/06/2017   Procedure: CYSTOSCOPY WITH  STENT PLACEMENT RIGHT;  Surgeon: Raynelle Bring, MD;  Location: [facility];  Service: Urology;  Laterality: Right;   CYSTOSCOPY WITH RETROGRADE PYELOGRAM, URETEROSCOPY AND STENT PLACEMENT Left 06/09/2019   Procedure: CYSTOSCOPY WITH RETROGRADE PYELOGRAM, URETEROSCOPY AND STENT PLACEMENT X2;  Surgeon: [name], MD;  Location: [facility];  Service: Urology;  Laterality: Left;   CYSTOSCOPY WITH RETROGRADE PYELOGRAM, URETEROSCOPY AND STENT PLACEMENT Bilateral 03/01/2020   Procedure: CYSTOSCOPY WITH RETROGRADE PYELOGRAM, URETEROSCOPY AND STENT PLACEMENT;  Surgeon: [name], MD;  Location: [facility];  Service: Urology;  Laterality: Bilateral;  75 MINS   CYSTOSCOPY/RETROGRADE/URETEROSCOPY     1 year ago at [place] in [place] Right 11/24/2017   Procedure: CYSTOSCOPY/URETEROSCOPY/ RETROGRADE/STENT REMOVAL;  Surgeon: Raynelle Bring, MD;  Location: [facility];  Service: Urology;  Laterality: Right;   HOLMIUM LASER APPLICATION Bilateral [id number]   Procedure: HOLMIUM LASER APPLICATION;  Surgeon: [name], MD;  Location: [facility];  Service: Urology;  Laterality: Bilateral;   prosatectomy       Current Outpatient Medications:    acetaminophen (TYLENOL) 325 MG tablet, Take 2 tablets (650 mg total) by mouth every 6 (six) hours as needed  for mild pain or moderate pain., Disp: , Rfl:    apixaban (ELIQUIS) 2.5 MG TABS tablet, Take 1 tablet (2.5 mg total) by mouth 2 (two) times daily., Disp: , Rfl:    atorvastatin (LIPITOR) 20 MG tablet, Take 1 tablet (20 mg total) by mouth at bedtime., Disp: 90 tablet, Rfl: 3   carvedilol (COREG) 25 MG tablet, Take 25 mg by mouth 2 (two) times daily., Disp: , Rfl:    Cholecalciferol (VITAMIN D3) 50 MCG (2000 UT) capsule, Take  2,000 Units by mouth in the morning and at bedtime. , Disp: , Rfl:    indapamide (LOZOL) 1.25 MG tablet, Take 1.25 mg by mouth daily., Disp: , Rfl:    metoCLOPramide (REGLAN) 5 MG tablet, TAKE 1 TABLET (5 MG TOTAL) BY MOUTH 3 (THREE) TIMES DAILY BEFORE MEALS., Disp: 90 tablet, Rfl: 1  EXAM:  VITALS per patient if applicable:  GENERAL: alert, oriented, appears well and in no acute distress  HEENT: atraumatic, conjunttiva clear, no obvious abnormalities on inspection of external nose and ears  NECK: normal movements of the head and neck  LUNGS: on inspection no signs of respiratory distress, breathing rate appears normal, no obvious gross SOB, gasping or wheezing  CV: no obvious cyanosis  MS: moves all visible extremities without noticeable abnormality  PSYCH/NEURO: pleasant and cooperative, no obvious depression or anxiety, speech and thought processing grossly intact  ASSESSMENT AND PLAN:  Discussed the following assessment and plan:  Nasal congestion  -we discussed possible serious and likely etiologies, options for evaluation and workup, limitations of telemedicine visit vs in person visit, treatment, treatment risks and precautions. Pt prefers to treat via telemedicine empirically rather than in person at this moment. Query allergic rhinitis, VURI vs other. Discussed possibility of covid given rising levels in the community and testing options. Opted for nasal saline, starting and antihistamine and possible INS - see pt instructions.   Work/School slipped offered: declined Advised to seek prompt in person care if worsening, new symptoms arise, or if is not improving with treatment. Discussed options for inperson care if PCP office not available. Did let this patient know that I only do telemedicine on Tuesdays and Thursdays for [facility]. Advised to schedule follow up visit with PCP or [facility] if any further questions or concerns to avoid delays in care.   I discussed the assessment and  treatment plan with the patient. The patient was provided an opportunity to ask questions and all were answered. The patient agreed with the plan and demonstrated an understanding of the instructions.     Lucretia Kern, DO

## 2021-07-24 NOTE — Patient Instructions (Signed)
  HOME CARE TIPS:  -[place] testing information: [email or web address].org/ OR [phone] Most pharmacies also offer testing and home test kits. If the Covid19 test is positive, please make a prompt follow up visit with your primary care office or with [facility] to discuss treatment options. Treatments for Covid19 are best given early in the course of the illness.   -Allegra or Zyrtec once daily for 2 weeks  -could flonase 2 sprays each nostril daily for 2 - 3 weeks  -can use nasal saline a few times per day if you have nasal congestion  -follow up with your doctor in 2-3 days unless improving and feeling better  -stay home while sick, except to seek medical care. If you have COVID19, ideally it would be best to stay home for a full 10 days since the onset of symptoms PLUS one day of no fever and feeling better. Wear a good mask that fits snugly (such as N95 or KN95) if around others to reduce the risk of transmission.  It was nice to meet you today, and I really hope you are feeling better soon. I help [facility] out with telemedicine visits on Tuesdays and Thursdays and am available for visits on those days. If you have any concerns or questions following this visit please schedule a follow up visit with your Primary Care doctor or seek care at a local urgent care clinic to avoid delays in care.    Seek in person care or schedule a follow up video visit promptly if your symptoms worsen, new concerns arise or you are not improving with treatment. Call 911 and/or seek emergency care if your symptoms are severe or life threatening.

## 2021-07-26 ENCOUNTER — Telehealth: Payer: Self-pay

## 2021-07-26 NOTE — Progress Notes (Signed)
Chief Complaint:   OBESITY Aymen is here to discuss his progress with his obesity treatment plan along with follow-up of his obesity related diagnoses. Harkirat is on keeping a food journal and adhering to recommended goals of 1100-1400 calories and 70-85 grams of protein daily and states he is following his eating plan approximately 10% of the time. Daiven states he is doing water walking for 45 minutes 5 times per week.  Today's visit was #: 3 Starting weight: 230 lbs Starting date: 06/05/2021 Today's weight: 226 lbs Today's date: 07/18/2021 Total lbs lost to date: 4 Total lbs lost since last in-office visit: 3  Interim History: Tryson continues to do well with weight loss. His hunger is controlled and he is doing well meeting his nutritional goals. He is exercising well overall.  Subjective:   1. Chronic back pain, unspecified back location, unspecified back pain laterality Martae has chronic back and hip pain which is not relieved by Tylenol. He is not supposed to take NSAIDS due to renal function but he still takes them occasionally. He has some Voltain gel that he has not yet tried.  Assessment/Plan:   1. Chronic back pain, unspecified back location, unspecified back pain laterality Ahadu was encouraged to use Voltain gel and see if he gets some pain relief before taking NSAIDS. He is to continue exercise as is.  2. Obesity with current BMI 31.6 Jerimy is currently in the action stage of change. As such, his goal is to continue with weight loss efforts. He has agreed to keeping a food journal and adhering to recommended goals of 1100-1400 calories and 70-85 grams of protein daily.   Exercise goals: As is.  Behavioral modification strategies: increasing lean protein intake, increasing water intake, and keeping a strict food journal.  Lavoris has agreed to follow-up with our clinic in 3 to 4 weeks. He was informed of the importance of frequent follow-up visits to maximize his  success with intensive lifestyle modifications for his multiple health conditions.   Objective:   Blood pressure 127/77, pulse 66, temperature 98.2 F (36.8 C), height '5\' 11"'$  (1.803 m), weight 226 lb (102.5 kg), SpO2 96 %. Body mass index is 31.52 kg/m.  General: Cooperative, alert, well developed, in no acute distress. HEENT: Conjunctivae and lids unremarkable. Cardiovascular: Regular rhythm.  Lungs: Normal work of breathing. Neurologic: No focal deficits.   Lab Results  Component Value Date   CREATININE 1.5 (A) 07/11/2021   BUN 22 06/05/2021   NA 141 07/11/2021   K 4.2 07/11/2021   CL 110 (A) 07/11/2021   CO2 29 (A) 07/11/2021   Lab Results  Component Value Date   ALT 25 07/11/2021   AST 22 07/11/2021   ALKPHOS 80 07/11/2021   BILITOT 0.4 06/05/2021   Lab Results  Component Value Date   HGBA1C 5.5 06/05/2021   HGBA1C 5.5 02/07/2020   HGBA1C 5.5 05/18/2016   Lab Results  Component Value Date   INSULIN 14.9 06/05/2021   Lab Results  Component Value Date   TSH 1.930 06/05/2021   Lab Results  Component Value Date   CHOL 101 07/11/2021   HDL 34 (A) 07/11/2021   LDLCALC 50 07/11/2021   LDLDIRECT 145.4 06/27/2011   TRIG 84 07/11/2021   CHOLHDL 3 04/03/2021   Lab Results  Component Value Date   VD25OH 84.5 06/05/2021   VD25OH 45.4 02/07/2020   Lab Results  Component Value Date   WBC 8.0 07/11/2021   HGB 12.5 (A)  07/11/2021   HCT 41 07/11/2021   MCV 74.0 (L) 04/03/2021   PLT 237 07/11/2021   Lab Results  Component Value Date   IRON 47 07/11/2021   TIBC 332 07/11/2021   FERRITIN 10.8 07/11/2021   Attestation Statements:   Reviewed by clinician on day of visit: allergies, medications, problem list, medical history, surgical history, family history, social history, and previous encounter notes.  Time spent on visit including pre-visit chart review and post-visit care and charting was 30 minutes.    I, Trixie Dredge, am acting as transcriptionist  for Dennard Nip, MD.  I have reviewed the above documentation for accuracy and completeness, and I agree with the above. -  Dennard Nip, MD

## 2021-07-26 NOTE — Chronic Care Management (AMB) (Addendum)
Chronic Care Management Pharmacy Assistant   Name: Bruce Ball  MRN: [id number] DOB: [birth year]  Reason for Encounter: Disease State - Hypertension     Recent office visits:  07/24/21 (Video Visit) Colin Benton, MD - Family Medicine- Nasal Congestion - No prescription medications were given. Allegra or Zyrtec once daily for 2 weeks. Could try flonase 2 sprays each nostril daily for 2 - 3 weeks. Can use nasal saline a few times per day if you have nasal congestion. Follow up with your doctor in 2-3 days unless improving and feeling better  07/18/21 Dennard Nip, MD - Family Medicine - Chronic back pain - No encounter notes available.   07/12/2021 [name], MD - Family Medicine - Back pain - Referral placed for Physical Therapy. No medication changes. No follow up indicated.  06/19/21 Dennard Nip, MD - Family Medicine - Vit D Deficiency - No medication changes. Follow up for labs in 1 month.   06/05/21 Dennard Nip, MD - Family Medicine - Fatigue - Labs ordered. EKG done. No medication changes. Follow up in 2 weeks.   Recent consult visits:  07/20/21 Ni Gorsuchi. MD - Oncology - No medication changes. No follow up indicated.  07/19/21 Fransico Him, MD - Cardiology - Chronic Diastolic Congestive Heart Failure - No medication changes. Follow up in 6 months.   07/11/21 Ashok Cordia, MD - [place]-  No medication changes. No follow up noted.   05/23/21 Eugenia Pancoast, MD  - Sports Medicine - Osteoarthritis - Kenalog injection given in office. No mediation changes. No follow up indicated.   04/25/21 Ermalinda Barrios, PA-C - Cardiology - Chronic Diastolic Congestive Heart Failure - Nephrology referral placed. No medication changes. Follow up with Dr Radford Pax as scheduled.    Hospital visits:  Medication Reconciliation was completed by comparing discharge summary, patient's EMR and Pharmacy list, and upon discussion with patient.  Admitted to the hospital on 03/26/21 due  to Shortness of Breath. Discharge date was 03/29/21. Discharged from [facility]?Medications Started at [facility] Discharge:?? No new medications were started at discharge.   Medication Changes at Hospital Discharge: No changes were noted.   Medications Discontinued at Hospital Discharge: -Stopped Imdur 30 mg.  Medications that remain the same after Hospital Discharge:??  -All other medications will remain the same.    Medications: Outpatient Encounter Medications as of 07/26/2021  Medication Sig   acetaminophen (TYLENOL) 325 MG tablet Take 2 tablets (650 mg total) by mouth every 6 (six) hours as needed for mild pain or moderate pain.   apixaban (ELIQUIS) 2.5 MG TABS tablet Take 1 tablet (2.5 mg total) by mouth 2 (two) times daily.   atorvastatin (LIPITOR) 20 MG tablet Take 1 tablet (20 mg total) by mouth at bedtime.   carvedilol (COREG) 25 MG tablet Take 25 mg by mouth 2 (two) times daily.   Cholecalciferol (VITAMIN D3) 50 MCG (2000 UT) capsule Take 2,000 Units by mouth in the morning and at bedtime.    indapamide (LOZOL) 1.25 MG tablet Take 1.25 mg by mouth daily.   metoCLOPramide (REGLAN) 5 MG tablet TAKE 1 TABLET (5 MG TOTAL) BY MOUTH 3 (THREE) TIMES DAILY BEFORE MEALS.   No facility-administered encounter medications on file as of 07/26/2021.    Reviewed chart prior to disease state call. Spoke with patient regarding BP  Recent Office Vitals: BP Readings from Last 3 Encounters:  07/20/21 128/61  07/19/21 140/76  07/18/21 127/77   Pulse Readings  from Last 3 Encounters:  07/20/21 (!) 58  07/19/21 64  07/18/21 66    Wt Readings from Last 3 Encounters:  07/24/21 228 lb (103.4 kg)  07/20/21 232 lb (105.2 kg)  07/19/21 232 lb 9.6 oz (105.5 kg)     Kidney Function Lab Results  Component Value Date/Time   CREATININE 1.5 (A) 07/11/2021 12:00 AM   CREATININE 1.42 (H) 06/05/2021 09:35 AM   CREATININE 1.7 (A) 05/11/2021 12:00 AM   CREATININE 1.67 (H)  04/03/2021 04:37 PM   CREATININE 1.67 (H) 01/25/2021 11:25 AM   CREATININE 1.42 (H) 07/25/2020 11:54 AM   GFR 39.99 (L) 04/03/2021 04:37 PM   GFRNONAA 47 (L) 03/29/2021 04:47 AM   GFRNONAA 43 (L) 01/25/2021 11:25 AM   GFRAA 56 (L) 07/25/2020 11:54 AM    BMP Latest Ref Rng & Units 07/11/2021 06/05/2021 05/11/2021  Glucose 65 - 99 mg/dL - 98 -  BUN 8 - 27 mg/dL - 22 22(A)  Creatinine 0.6 - 1.3 1.5(A) 1.42(H) 1.7(A)  BUN/Creat Ratio 10 - 24 - 15 -  Sodium 137 - 147 141 149(H) 142  Potassium 3.4 - 5.3 4.2 4.6 4.5  Chloride 99 - 108 110(A) 110(H) 104  CO2 13 - 22 29(A) 22 24(A)  Calcium 8.7 - 10.7 8.6(A) 8.9 9.2    Current antihypertensive regimen:  Carvedilol 25 mg 1 tablet twice daily  How often are you checking your Blood Pressure? daily  Current home BP readings: 127/68 on 07/25/21  What recent interventions/DTPs have been made by any provider to improve Blood Pressure control since last CPP Visit:   Any recent hospitalizations or ED visits since last visit with CPP? Yes  What diet changes have been made to improve Blood Pressure Control?  Patient reports he is currently working on decreasing his salt intake.  What exercise is being done to improve your Blood Pressure Control?  Patient reports he remains active and is able to complete activities of daily living on his own and currently drives himself.   Adherence Review: Is the patient currently on ACE/ARB medication? Yes Does the patient have >5 day gap between last estimated fill dates? No  Medication Adherence Review:  Carvedilol 25 mg 1 tablet twice daily - last filled last month at [place]. Patient is unsure of exact date but reports having filled last month. He did not have his bottle with him currently.    Star Rating Drugs: Atorvastatin 20 mg  - last filled last month at [place]. Patient is unsure of exact date but reports having filled last month. He did not have his bottle with him currently.   Jobe Gibbon, [facility] Clinical  Pharmacist Assistant  [phone]

## 2021-07-30 ENCOUNTER — Other Ambulatory Visit: Payer: Self-pay

## 2021-07-30 ENCOUNTER — Encounter: Payer: Self-pay | Admitting: Physical Therapy

## 2021-07-30 ENCOUNTER — Ambulatory Visit (INDEPENDENT_AMBULATORY_CARE_PROVIDER_SITE_OTHER): Payer: Medicare Other | Admitting: Physical Therapy

## 2021-07-30 DIAGNOSIS — R2689 Other abnormalities of gait and mobility: Secondary | ICD-10-CM

## 2021-07-30 DIAGNOSIS — M6281 Muscle weakness (generalized): Secondary | ICD-10-CM | POA: Diagnosis not present

## 2021-07-30 DIAGNOSIS — G8929 Other chronic pain: Secondary | ICD-10-CM

## 2021-07-30 DIAGNOSIS — M545 Low back pain, unspecified: Secondary | ICD-10-CM

## 2021-07-30 NOTE — Patient Instructions (Signed)
Access Code: [email or web address] URL: [email or web address] Date: 07/30/2021 Prepared by: Lyndee Hensen  Exercises Hooklying Single Knee to Chest Stretch - 2 x daily - 3 reps - 30 hold Supine Posterior Pelvic Tilt - 2 x daily - 2 sets - 10 reps Seated Hamstring Stretch - 2 x daily - 3 reps - 30 hold

## 2021-08-01 ENCOUNTER — Ambulatory Visit (INDEPENDENT_AMBULATORY_CARE_PROVIDER_SITE_OTHER): Payer: Medicare Other | Admitting: Physical Therapy

## 2021-08-01 ENCOUNTER — Other Ambulatory Visit: Payer: Self-pay

## 2021-08-01 DIAGNOSIS — M6281 Muscle weakness (generalized): Secondary | ICD-10-CM | POA: Diagnosis not present

## 2021-08-01 DIAGNOSIS — G8929 Other chronic pain: Secondary | ICD-10-CM

## 2021-08-01 DIAGNOSIS — R2689 Other abnormalities of gait and mobility: Secondary | ICD-10-CM

## 2021-08-01 DIAGNOSIS — M545 Low back pain, unspecified: Secondary | ICD-10-CM

## 2021-08-02 ENCOUNTER — Encounter: Payer: Self-pay | Admitting: Physical Therapy

## 2021-08-02 NOTE — Therapy (Signed)
[facility] [address] [place], [place], [address] Phone: [phone]   Fax:  [phone]  Physical Therapy Treatment  Patient Details  Name: Bruce Ball MRN: [id number] Date of Birth: November 19, 1946 Referring Provider (PT): [name]   Encounter Date: 08/01/2021   PT End of Session - 08/02/21 2100     Visit Number 2    Number of Visits 12    Date for PT Re-Evaluation 09/10/21    Authorization Type Medicare    PT Start Time [id number]    PT Stop Time [id number]    PT Time Calculation (min) 40 min    Activity Tolerance Patient limited by pain;Patient tolerated treatment well    Behavior During Therapy [facility] for tasks assessed/performed             Past Medical History:  Diagnosis Date   Ankylosing spondylitis ([place])    Arthritis of right knee    Basal cell carcinoma    Chronic back pain greater than 3 months duration    Chronic leg pain    CKD (chronic kidney disease), stage II    Coronary artery calcification seen on CAT scan 03/27/2021   Deafness in right ear    Dyspnea    cause by PE   Essential tremor    Extrinsic asthma, unspecified 04/07/2009   PT DENIES   GERD (gastroesophageal reflux disease)    Hearing loss    Hip problem    History of kidney stones    HYPERTENSION 10/29/2007   HYPOTENSION 08/13/2010   LOW BACK PAIN 04/21/2008   Mild aortic stenosis    NEPHROLITHIASIS, HX OF 04/21/2008   Neuropathy    Osteopenia    Other hyperlipidemia    Peptic ulcer    Pneumonia    PUD, HX OF 04/21/2008   Pulmonary emboli ([place]) 2021   noted on CT 12-2019   Renal disorder    Stage 3 chronic kidney disease (HCC)    Thyroid nodule     Past Surgical History:  Procedure Laterality Date   BACK SURGERY     bone cancer  1970   rt femur removed and steel rod placed   [place], URETEROSCOPY AND STENT PLACEMENT Right 11/06/2017   Procedure: CYSTOSCOPY WITH  STENT PLACEMENT RIGHT;  Surgeon: Raynelle Bring,  MD;  Location: [facility];  Service: Urology;  Laterality: Right;   CYSTOSCOPY WITH RETROGRADE PYELOGRAM, URETEROSCOPY AND STENT PLACEMENT Left 06/09/2019   Procedure: CYSTOSCOPY WITH RETROGRADE PYELOGRAM, URETEROSCOPY AND STENT PLACEMENT X2;  Surgeon: [name], MD;  Location: [facility];  Service: Urology;  Laterality: Left;   CYSTOSCOPY WITH RETROGRADE PYELOGRAM, URETEROSCOPY AND STENT PLACEMENT Bilateral 03/01/2020   Procedure: CYSTOSCOPY WITH RETROGRADE PYELOGRAM, URETEROSCOPY AND STENT PLACEMENT;  Surgeon: [name], MD;  Location: [facility];  Service: Urology;  Laterality: Bilateral;  75 MINS   CYSTOSCOPY/RETROGRADE/URETEROSCOPY     1 year ago at [place] in [place] Right 11/24/2017   Procedure: CYSTOSCOPY/URETEROSCOPY/ RETROGRADE/STENT REMOVAL;  Surgeon: Raynelle Bring, MD;  Location: [facility];  Service: Urology;  Laterality: Right;   HOLMIUM LASER APPLICATION Bilateral [id number]   Procedure: HOLMIUM LASER APPLICATION;  Surgeon: [name], MD;  Location: [facility];  Service: Urology;  Laterality: Bilateral;   prosatectomy      There were no vitals filed for this visit.   Subjective Assessment - 08/02/21 2057     Subjective Pt with no new complaints.    Currently in Pain? Yes  Pain Score 6     Pain Location Back    Pain Orientation Right;Left    Pain Descriptors / Indicators Aching    Pain Type Chronic pain                               [facility] Adult PT Treatment/Exercise - 08/02/21 2102       Exercises   Exercises Lumbar      Lumbar Exercises: Stretches   Active Hamstring Stretch 30 seconds;3 reps    Active Hamstring Stretch Limitations seated    Single Knee to Chest Stretch 3 reps;30 seconds    Pelvic Tilt 15 reps      Lumbar Exercises: Standing   Other Standing Lumbar Exercises March x 10 with UE support at counter;  L/R and A/P weight shifts x 20 ea;      Lumbar Exercises: Seated   Sit to Stand Limitations  2x 5 from higher mat table.    Other Seated Lumbar Exercises Rows GTB x 15;      Lumbar Exercises: Supine   Clam 20 reps    Clam Limitations GTB    Bent Knee Raise 20 reps    Bent Knee Raise Limitations with TA    Straight Leg Raise 10 reps    Straight Leg Raises Limitations 2x5 on R,  x10 on L; with TA;                      PT Short Term Goals - 08/02/21 1457       PT SHORT TERM GOAL #1   Title Pt to be independent wtih final HEP    Time 6    Period Weeks    Status New    Target Date 09/10/21      PT SHORT TERM GOAL #2   Title Pt to report decreased pain in lumbar to 0-3/10 with standing activity and transfers.    Time 6    Period Weeks    Status New    Target Date 09/10/21      PT SHORT TERM GOAL #3   Title Pt to demo improved ability and mechanics for ambulation, with improved step height , LRAD, and balance/safety WNL for pt age.    Time 6    Period Weeks    Status New    Target Date 09/10/21      PT SHORT TERM GOAL #4   Title Pt to have improved score on TUG (time TBD after testing )    Time 6    Period Weeks    Status New    Target Date 09/10/21                      Plan - 08/02/21 2104     Clinical Impression Statement Pt with significant pain with supine to/from sit transfers. Is able to lay on back for short period for ther ex. Did bring 4WW for ambulation today . Pt with improved gait mechanics, improved abilityfor step height, improved balance, and improved trendellenberg with use of walker, recommended pt use when practicing walking in the house. Plan to continue lumbar exercises for pain relief, and progress strength and balance as tolerated.    Personal Factors and Comorbidities Fitness;Comorbidity 1;Comorbidity 2    Comorbidities prev bone CA in R hip, Previous prostate CA, CAD, CKD, Ankylosing spondylitis, Lumbar surgery    Examination-Activity Limitations Stand;Lift;Locomotion  Level;Bed Mobility;Bend;Transfers;Squat;Stairs     Examination-Participation Restrictions Shop;Community Activity;Cleaning    Stability/Clinical Decision Making Evolving/Moderate complexity    Rehab Potential Good    PT Frequency 2x / week    PT Duration 6 weeks    PT Treatment/Interventions ADLs/Self Care Home Management;Cryotherapy;Electrical Stimulation;Iontophoresis '4mg'$ /ml Dexamethasone;Moist Heat;Traction;Ultrasound;DME Instruction;Gait training;Stair training;Functional mobility training;Therapeutic activities;Therapeutic exercise;Balance training;Neuromuscular re-education;Patient/family education;Manual techniques;Passive range of motion;Dry needling;Energy conservation;Taping;Spinal Manipulations;Joint Manipulations    PT Home Exercise Plan [email or web address]    Consulted and Agree with Plan of Care Patient             Patient will benefit from skilled therapeutic intervention in order to improve the following deficits and impairments:  Abnormal gait, Pain, Improper body mechanics, Increased muscle spasms, Decreased mobility, Decreased coordination, Decreased activity tolerance, Decreased endurance, Decreased range of motion, Decreased strength, Hypomobility, Impaired flexibility, Difficulty walking, Decreased balance, Decreased safety awareness  Visit Diagnosis: Chronic bilateral low back pain without sciatica  Muscle weakness (generalized)  Other abnormalities of gait and mobility     Problem List Patient Active Problem List   Diagnosis Date Noted   Other fatigue 06/05/2021   CAD (coronary artery disease) [id number]   Diastolic CHF ([place]) [id number]   SOB (shortness of breath) on exertion 03/26/2021   Chronic anticoagulation 03/26/2021   Anemia in chronic kidney disease 01/25/2021   Acquired coagulation disorder ([place]) 12/12/2020   Senile purpura ([place]) 12/12/2020   Thin skin 07/25/2020   Aortic atherosclerosis ([place]) 05/17/2020   B12 deficiency 05/17/2020   Iliac artery aneurysm ([place]) 05/17/2020   Osteoporosis 04/04/2020    Idiopathic neuropathy 01/28/2020   Recurrent falls 01/28/2020   Chronic kidney disease (CKD), stage III (moderate) ([place]) 01/19/2020   Pulmonary emboli ([place]) 01/14/2020   GERD (gastroesophageal reflux disease) 12/22/2019   Essential tremor 12/22/2019   Femoral condyle fracture ([place]) 07/15/2019   Gross hematuria 06/14/2019   Hyperlipidemia 11/11/2017   Community acquired pneumonia of right upper lobe of lung 11/05/2017   Right ureteral stone 11/05/2017   History of total right hip replacement 03/08/2017   History of prostate cancer 03/08/2017   Hx of chondrosarcoma 02/28/2017   High risk medication use 02/28/2017   DDD (degenerative disc disease), thoracic 02/28/2017   Ataxia 05/18/2016   Vestibular disequilibrium 05/18/2016   Ankylosing spondylitis ([place]) 05/24/2014   Lung nodule, solitary 05/24/2014   Peripheral edema 06/08/2012   Extrinsic asthma 04/07/2009   LOW BACK PAIN 04/21/2008   History of peptic ulcer disease 04/21/2008   Primary hypertension 10/29/2007    Lyndee Hensen, PT, DPT 9:07 PM  08/02/21    [facility] [place] [address] [place], [place], [address] Phone: [phone]   Fax:  [phone]  Name: Bruce Ball MRN: [id number] Date of Birth: 1946-01-06

## 2021-08-02 NOTE — Therapy (Signed)
[facility] [address] [place], [place], [address] Phone: [phone]   Fax:  [phone]  Physical Therapy Evaluation  Patient Details  Name: Bruce Ball MRN: [id number] Date of Birth: [birth year] Referring Provider (PT): [name]   Encounter Date: 07/30/2021   PT End of Session - 08/02/21 1444     Visit Number 1    Number of Visits 12    Date for PT Re-Evaluation 09/10/21    Authorization Type Medicare    PT Start Time 1216    PT Stop Time [id number]    PT Time Calculation (min) 42 min    Activity Tolerance Patient limited by pain    Behavior During Therapy [facility] for tasks assessed/performed             Past Medical History:  Diagnosis Date   Ankylosing spondylitis ([place])    Arthritis of right knee    Basal cell carcinoma    Chronic back pain greater than 3 months duration    Chronic leg pain    CKD (chronic kidney disease), stage II    Coronary artery calcification seen on CAT scan 03/27/2021   Deafness in right ear    Dyspnea    cause by PE   Essential tremor    Extrinsic asthma, unspecified 04/07/2009   PT DENIES   GERD (gastroesophageal reflux disease)    Hearing loss    Hip problem    History of kidney stones    HYPERTENSION 10/29/2007   HYPOTENSION 08/13/2010   LOW BACK PAIN 04/21/2008   Mild aortic stenosis    NEPHROLITHIASIS, HX OF 04/21/2008   Neuropathy    Osteopenia    Other hyperlipidemia    Peptic ulcer    Pneumonia    PUD, HX OF 04/21/2008   Pulmonary emboli ([place]) 2021   noted on CT 12-2019   Renal disorder    Stage 3 chronic kidney disease ([place])    Thyroid nodule     Past Surgical History:  Procedure Laterality Date   BACK SURGERY     bone cancer  1970   rt femur removed and steel rod placed   CYSTOSCOPY WITH RETROGRADE PYELOGRAM, URETEROSCOPY AND STENT PLACEMENT Right 11/06/2017   Procedure: CYSTOSCOPY WITH  STENT PLACEMENT RIGHT;  Surgeon: Raynelle Bring, MD;  Location: [facility];   Service: Urology;  Laterality: Right;   CYSTOSCOPY WITH RETROGRADE PYELOGRAM, URETEROSCOPY AND STENT PLACEMENT Left 06/09/2019   Procedure: CYSTOSCOPY WITH RETROGRADE PYELOGRAM, URETEROSCOPY AND STENT PLACEMENT X2;  Surgeon: [name], MD;  [facility];  Service: Urology;  Laterality: Left;   CYSTOSCOPY WITH RETROGRADE PYELOGRAM, URETEROSCOPY AND STENT PLACEMENT Bilateral 03/01/2020   Procedure: CYSTOSCOPY WITH RETROGRADE PYELOGRAM, URETEROSCOPY AND STENT PLACEMENT;  Surgeon: [name], MD;  [facility];  Service: Urology;  Laterality: Bilateral;  75 MINS   CYSTOSCOPY/RETROGRADE/URETEROSCOPY     1 year ago at [place] in [place] Right 11/24/2017   Procedure: CYSTOSCOPY/URETEROSCOPY/ RETROGRADE/STENT REMOVAL;  Surgeon: Raynelle Bring, MD;  [facility];  Service: Urology;  Laterality: Right;   HOLMIUM LASER APPLICATION Bilateral [id number]   Procedure: HOLMIUM LASER APPLICATION;  Surgeon: [name], MD;  [facility];  Service: Urology;  Laterality: Bilateral;   prosatectomy      There were no vitals filed for this visit.    Subjective Assessment - 08/02/21 1440     Subjective Pt states back and R knee. Back Pain ( 2 previous surgeries). 1 year  ago, was doing ok, but then had increased pain after a fall. Back: 6/10. Increased pain: everything.  Less pain:laying flat with feet up. in recliner. Falls: 3-4. Does states weaknes in R leg, hip, he had  R hip replacement due to cancer 50 years ago,  L hip hurts also when he is in cold water (at [place]).    Likes  Water walking classes at [employer].   Has cane and walker, not using now.Does  Drive, lives with wife.    Pertinent History prev bone CA in R hip, Previous prostate CA, CAD, CKD, Ankylosing spondylitis    Patient Stated Goals decreased back pain.    Currently in Pain? Yes    Pain Score 6     Pain Location Back    Pain Orientation Right;Left;Lower    Pain Descriptors / Indicators  Aching    Pain Type Chronic pain    Pain Onset More than a month ago    Pain Frequency Intermittent    Aggravating Factors  most activities/motions, Much pain with transfers.    Pain Relieving Factors laying in recliner with feet up.                [facility] PT Assessment - 08/02/21 0001       Assessment   Medical Diagnosis Low Back Pain    Referring Provider (PT) [name]    Prior Therapy no      Precautions   Precautions Fall      Balance Screen   Has the patient fallen in the past 6 months Yes    How many times? 2    Has the patient had a decrease in activity level because of a fear of falling?  Yes    Is the patient reluctant to leave their home because of a fear of falling?  No      Prior Function   Level of Independence Independent      Cognition   Overall Cognitive Status Within Functional Limits for tasks assessed      AROM   Overall AROM Comments Lumbar: mod limitation for flexion, Mod/significant liimtation for extension, rot, and SB;  Hips: mild limitation for flexion, mod limitation for rotation; Knees: WNL;      Strength   Overall Strength Comments Hips: 4-/5, R hip abd: 3-/5;  Knees: 4+/5;      Palpation   Palpation comment Edema in bil feet L>R; Tenderness in bil lumbar musculature,      Transfers   Comments severe back pain with supine to/from sit transfer      Ambulation/Gait   Gait Comments Decreased balance, poor foot clearance, low step height , with significant R lateral trunk lean (trendelenberg) due to R hip weakness.                        Objective measurements completed on examination: See above findings.       [facility] Adult PT Treatment/Exercise - 08/02/21 0001       Exercises   Exercises Lumbar      Lumbar Exercises: Stretches   Active Hamstring Stretch 30 seconds;3 reps    Active Hamstring Stretch Limitations seated    Single Knee to Chest Stretch 3 reps;30 seconds    Pelvic Tilt 15 reps                     PT Education - 08/02/21 1443     Education Details PT POC,  Exam findings, HEP,    Person(s) Educated Patient    Methods Explanation;Demonstration;Tactile cues;Verbal cues;Handout    Comprehension Verbalized understanding;Returned demonstration;Verbal cues required;Tactile cues required;Need further instruction              PT Short Term Goals - 08/02/21 1457       PT SHORT TERM GOAL #1   Title Pt to be independent wtih final HEP    Time 6    Period Weeks    Status New    Target Date 09/10/21      PT SHORT TERM GOAL #2   Title Pt to report decreased pain in lumbar to 0-3/10 with standing activity and transfers.    Time 6    Period Weeks    Status New    Target Date 09/10/21      PT SHORT TERM GOAL #3   Title Pt to demo improved ability and mechanics for ambulation, with improved step height , LRAD, and balance/safety WNL for pt age.    Time 6    Period Weeks    Status New    Target Date 09/10/21      PT SHORT TERM GOAL #4   Title Pt to have improved score on TUG (time TBD after testing )    Time 6    Period Weeks    Status New    Target Date 09/10/21                       Plan - 08/02/21 1501     Clinical Impression Statement Pt presents with primary complaint of increased pain in low back. Pain has been chronic, but worsening. Pt with much difficulty with transfers and functional activitiy due to pain. Pt with decreased ROM, and decreased strength. He has significant strength loss in R hip from previous surgery. Aside from pain, pt also has overall decreased mobility and is deconditioned. He has weakness in LEs, and poor stability. He has decreased balance with standing activities, difficulty with stairs, and poor gait mechanics. Pt with decrease safety with gait, and is a fall risk. Recommneded he bring RW next visit to try .Pt to benefit from skilled PT to improve overall mobility, transfers, gait, safety, strength, and back pain.     Personal Factors and Comorbidities Fitness;Comorbidity 1;Comorbidity 2    Comorbidities prev bone CA in R hip, Previous prostate CA, CAD, CKD, Ankylosing spondylitis, Lumbar surgery    Examination-Activity Limitations Stand;Lift;Locomotion Level;Bed Mobility;Bend;Transfers;Squat;Stairs    Examination-Participation Restrictions Shop;Community Activity;Cleaning    Stability/Clinical Decision Making Evolving/Moderate complexity    Clinical Decision Making Moderate    Rehab Potential Good    PT Frequency 2x / week    PT Duration 6 weeks    PT Treatment/Interventions ADLs/Self Care Home Management;Cryotherapy;Electrical Stimulation;Iontophoresis '4mg'$ /ml Dexamethasone;Moist Heat;Traction;Ultrasound;DME Instruction;Gait training;Stair training;Functional mobility training;Therapeutic activities;Therapeutic exercise;Balance training;Neuromuscular re-education;Patient/family education;Manual techniques;Passive range of motion;Dry needling;Energy conservation;Taping;Spinal Manipulations;Joint Manipulations    PT Home Exercise Plan [id number]    Consulted and Agree with Plan of Care Patient             Patient will benefit from skilled therapeutic intervention in order to improve the following deficits and impairments:  Abnormal gait, Pain, Improper body mechanics, Increased muscle spasms, Decreased mobility, Decreased coordination, Decreased activity tolerance, Decreased endurance, Decreased range of motion, Decreased strength, Hypomobility, Impaired flexibility, Difficulty walking, Decreased balance, Decreased safety awareness  Visit Diagnosis: Chronic bilateral low back pain without sciatica  Muscle weakness (generalized)  Other  abnormalities of gait and mobility     Problem List Patient Active Problem List   Diagnosis Date Noted   Other fatigue 06/05/2021   CAD (coronary artery disease) Q000111Q   Diastolic CHF ([place]) 123XX123   SOB (shortness of breath) on exertion 03/26/2021    Chronic anticoagulation 03/26/2021   Anemia in chronic kidney disease 01/25/2021   Acquired coagulation disorder ([place]) 12/12/2020   Senile purpura ([place]) 12/12/2020   Thin skin 07/25/2020   Aortic atherosclerosis ([place]) 05/17/2020   B12 deficiency 05/17/2020   Iliac artery aneurysm ([place]) 05/17/2020   Osteoporosis 04/04/2020   Idiopathic neuropathy 01/28/2020   Recurrent falls 01/28/2020   Chronic kidney disease (CKD), stage III (moderate) ([place]) 01/19/2020   Pulmonary emboli ([employer]) 01/14/2020   GERD (gastroesophageal reflux disease) 12/22/2019   Essential tremor 12/22/2019   Femoral condyle fracture ([place]) 07/15/2019   Gross hematuria 06/14/2019   Hyperlipidemia 11/11/2017   Community acquired pneumonia of right upper lobe of lung 11/05/2017   Right ureteral stone 11/05/2017   History of total right hip replacement 03/08/2017   History of prostate cancer 03/08/2017   Hx of chondrosarcoma 02/28/2017   High risk medication use 02/28/2017   DDD (degenerative disc disease), thoracic 02/28/2017   Ataxia 05/18/2016   Vestibular disequilibrium 05/18/2016   Ankylosing spondylitis ([place]) 05/24/2014   Lung nodule, solitary 05/24/2014   Peripheral edema 06/08/2012   Extrinsic asthma 04/07/2009   LOW BACK PAIN 04/21/2008   History of peptic ulcer disease 04/21/2008   Primary hypertension 10/29/2007   Lyndee Hensen, PT, DPT 8:53 PM  08/02/21    [facility] [address] [place], [place], [address] Phone: [phone]   Fax:  [phone]  Name: Bruce Ball MRN: [id number] Date of Birth: [birth year]

## 2021-08-07 ENCOUNTER — Encounter: Payer: Self-pay | Admitting: Physical Therapy

## 2021-08-07 ENCOUNTER — Other Ambulatory Visit: Payer: Self-pay

## 2021-08-07 ENCOUNTER — Ambulatory Visit (INDEPENDENT_AMBULATORY_CARE_PROVIDER_SITE_OTHER): Payer: Medicare Other | Admitting: Physical Therapy

## 2021-08-07 DIAGNOSIS — M545 Low back pain, unspecified: Secondary | ICD-10-CM | POA: Diagnosis not present

## 2021-08-07 DIAGNOSIS — G8929 Other chronic pain: Secondary | ICD-10-CM

## 2021-08-07 DIAGNOSIS — M6281 Muscle weakness (generalized): Secondary | ICD-10-CM

## 2021-08-07 DIAGNOSIS — R2689 Other abnormalities of gait and mobility: Secondary | ICD-10-CM | POA: Diagnosis not present

## 2021-08-07 NOTE — Therapy (Signed)
[facility] [address] [place], [place], [address] Phone: [phone]   Fax:  [phone]  Physical Therapy Treatment  Patient Details  Name: Bruce Ball MRN: [id number] Date of Birth: Dec 06, 1946 Referring Provider (PT): [name]   Encounter Date: 08/07/2021   PT End of Session - 08/07/21 2126     Visit Number 3    Number of Visits 12    Date for PT Re-Evaluation 09/10/21    Authorization Type Medicare    PT Start Time [id number]    PT Stop Time 1558    PT Time Calculation (min) 41 min    Activity Tolerance Patient limited by pain;Patient tolerated treatment well    Behavior During Therapy [facility] for tasks assessed/performed             Past Medical History:  Diagnosis Date   Ankylosing spondylitis ([place])    Arthritis of right knee    Basal cell carcinoma    Chronic back pain greater than 3 months duration    Chronic leg pain    CKD (chronic kidney disease), stage II    Coronary artery calcification seen on CAT scan 03/27/2021   Deafness in right ear    Dyspnea    cause by PE   Essential tremor    Extrinsic asthma, unspecified 04/07/2009   PT DENIES   GERD (gastroesophageal reflux disease)    Hearing loss    Hip problem    History of kidney stones    HYPERTENSION 10/29/2007   HYPOTENSION 08/13/2010   LOW BACK PAIN 04/21/2008   Mild aortic stenosis    NEPHROLITHIASIS, HX OF 04/21/2008   Neuropathy    Osteopenia    Other hyperlipidemia    Peptic ulcer    Pneumonia    PUD, HX OF 04/21/2008   Pulmonary emboli ([place]) 2021   noted on CT 12-2019   Renal disorder    Stage 3 chronic kidney disease ([facility])    Thyroid nodule     Past Surgical History:  Procedure Laterality Date   BACK SURGERY     bone cancer  1970   rt femur removed and steel rod placed   [place], URETEROSCOPY AND STENT PLACEMENT Right 11/06/2017   Procedure: CYSTOSCOPY WITH  STENT PLACEMENT RIGHT;  Surgeon: Raynelle Bring,  MD;  [facility];  Service: Urology;  Laterality: Right;   CYSTOSCOPY WITH RETROGRADE PYELOGRAM, URETEROSCOPY AND STENT PLACEMENT Left 06/09/2019   Procedure: CYSTOSCOPY WITH RETROGRADE PYELOGRAM, URETEROSCOPY AND STENT PLACEMENT X2;  Surgeon: [name], MD;  [facility];  Service: Urology;  Laterality: Left;   CYSTOSCOPY WITH RETROGRADE PYELOGRAM, URETEROSCOPY AND STENT PLACEMENT Bilateral 03/01/2020   Procedure: CYSTOSCOPY WITH RETROGRADE PYELOGRAM, URETEROSCOPY AND STENT PLACEMENT;  Surgeon: [name], MD;  [facility];  Service: Urology;  Laterality: Bilateral;  75 MINS   CYSTOSCOPY/RETROGRADE/URETEROSCOPY     1 year ago at [place] in [place] Right 11/24/2017   Procedure: CYSTOSCOPY/URETEROSCOPY/ RETROGRADE/STENT REMOVAL;  Surgeon: Raynelle Bring, MD;  [facility];  Service: Urology;  Laterality: Right;   HOLMIUM LASER APPLICATION Bilateral [id number]   Procedure: HOLMIUM LASER APPLICATION;  Surgeon: [name], MD;  [facility];  Service: Urology;  Laterality: Bilateral;   prosatectomy      There were no vitals filed for this visit.   Subjective Assessment - 08/07/21 2125     Subjective Pt with no new complaints.    Currently in Pain? Yes  Pain Score 6     Pain Location Back    Pain Orientation Right    Pain Descriptors / Indicators Aching    Pain Type Chronic pain    Pain Onset More than a month ago    Pain Frequency Intermittent                               [facility] Adult PT Treatment/Exercise - 08/07/21 0001       Lumbar Exercises: Stretches   Active Hamstring Stretch 30 seconds;3 reps    Active Hamstring Stretch Limitations seated    Other Lumbar Stretch Exercise seated fwd flexion 20 sec x 3;      Lumbar Exercises: Standing   Other Standing Lumbar Exercises At Counter: March x 20; Hip abd x 15 bil;  Mini Squats x 15:      Lumbar Exercises: Seated   Long Arc Quad on  Chair 20 reps    LAQ on Chair Weights (lbs) 3    Sit to Stand Limitations 2x 5 from higher mat table.    Other Seated Lumbar Exercises Rows GTB x 15;    Other Seated Lumbar Exercises Pelvic tilts x 20; Clams Bl TB x 20;      Manual Therapy   Manual Therapy Soft tissue mobilization    Soft tissue mobilization seated: DTM/TPR to R lumbar region                      PT Short Term Goals - 08/02/21 1457       PT SHORT TERM GOAL #1   Title Pt to be independent wtih final HEP    Time 6    Period Weeks    Status New    Target Date 09/10/21      PT SHORT TERM GOAL #2   Title Pt to report decreased pain in lumbar to 0-3/10 with standing activity and transfers.    Time 6    Period Weeks    Status New    Target Date 09/10/21      PT SHORT TERM GOAL #3   Title Pt to demo improved ability and mechanics for ambulation, with improved step height , LRAD, and balance/safety WNL for pt age.    Time 6    Period Weeks    Status New    Target Date 09/10/21      PT SHORT TERM GOAL #4   Title Pt to have improved score on TUG (time TBD after testing )    Time 6    Period Weeks    Status New    Target Date 09/10/21                      Plan - 08/07/21 2129     Clinical Impression Statement Pt educated on ther ex from seated position today for alternate positioning for HEP. Pt is able to lay on back, but has quite a bit of pain with transfers. Pt ambulated with SPC today, does have improved gait mechanics with improved step length, balance, and decreased trendelenberg with use of AD, recommended continued use. Plan to continue lumbar exercises for pain relief, and progress strength and balance as tolerated.    Personal Factors and Comorbidities Fitness;Comorbidity 1;Comorbidity 2    Comorbidities prev bone CA in R hip, Previous prostate CA, CAD, CKD, Ankylosing spondylitis, Lumbar surgery    Examination-Activity Limitations  Stand;Lift;Locomotion Level;Bed  Mobility;Bend;Transfers;Squat;Stairs    Examination-Participation Restrictions Shop;Community Activity;Cleaning    Stability/Clinical Decision Making Evolving/Moderate complexity    Rehab Potential Good    PT Frequency 2x / week    PT Duration 6 weeks    PT Treatment/Interventions ADLs/Self Care Home Management;Cryotherapy;Electrical Stimulation;Iontophoresis '4mg'$ /ml Dexamethasone;Moist Heat;Traction;Ultrasound;DME Instruction;Gait training;Stair training;Functional mobility training;Therapeutic activities;Therapeutic exercise;Balance training;Neuromuscular re-education;Patient/family education;Manual techniques;Passive range of motion;Dry needling;Energy conservation;Taping;Spinal Manipulations;Joint Manipulations    PT Home Exercise Plan [id number]    Consulted and Agree with Plan of Care Patient             Patient will benefit from skilled therapeutic intervention in order to improve the following deficits and impairments:  Abnormal gait, Pain, Improper body mechanics, Increased muscle spasms, Decreased mobility, Decreased coordination, Decreased activity tolerance, Decreased endurance, Decreased range of motion, Decreased strength, Hypomobility, Impaired flexibility, Difficulty walking, Decreased balance, Decreased safety awareness  Visit Diagnosis: Chronic bilateral low back pain without sciatica  Muscle weakness (generalized)  Other abnormalities of gait and mobility     Problem List Patient Active Problem List   Diagnosis Date Noted   Other fatigue 06/05/2021   CAD (coronary artery disease) Q000111Q   Diastolic CHF ([place]) 123XX123   SOB (shortness of breath) on exertion 03/26/2021   Chronic anticoagulation 03/26/2021   Anemia in chronic kidney disease 01/25/2021   Acquired coagulation disorder ([place]) 12/12/2020   Senile purpura ([place]) 12/12/2020   Thin skin 07/25/2020   Aortic atherosclerosis ([place]) 05/17/2020   B12 deficiency 05/17/2020   Iliac artery aneurysm ([place])  05/17/2020   Osteoporosis 04/04/2020   Idiopathic neuropathy 01/28/2020   Recurrent falls 01/28/2020   Chronic kidney disease (CKD), stage III (moderate) ([place]) 01/19/2020   Pulmonary emboli ([place]) 01/14/2020   GERD (gastroesophageal reflux disease) 12/22/2019   Essential tremor 12/22/2019   Femoral condyle fracture ([place]) 07/15/2019   Gross hematuria 06/14/2019   Hyperlipidemia 11/11/2017   Community acquired pneumonia of right upper lobe of lung 11/05/2017   Right ureteral stone 11/05/2017   History of total right hip replacement 03/08/2017   History of prostate cancer 03/08/2017   Hx of chondrosarcoma 02/28/2017   High risk medication use 02/28/2017   DDD (degenerative disc disease), thoracic 02/28/2017   Ataxia 05/18/2016   Vestibular disequilibrium 05/18/2016   Ankylosing spondylitis ([place]) 05/24/2014   Lung nodule, solitary 05/24/2014   Peripheral edema 06/08/2012   Extrinsic asthma 04/07/2009   LOW BACK PAIN 04/21/2008   History of peptic ulcer disease 04/21/2008   Primary hypertension 10/29/2007    Lyndee Hensen, PT, DPT 9:32 PM  08/07/21    [facility] [address] [place], [place], [address] Phone: [phone]   Fax:  [phone]  Name: DIVIT HAAKE MRN: [id number] Date of Birth: 1946-03-23

## 2021-08-13 ENCOUNTER — Encounter: Payer: Medicare Other | Admitting: Physical Therapy

## 2021-08-13 ENCOUNTER — Other Ambulatory Visit: Payer: Self-pay

## 2021-08-13 ENCOUNTER — Encounter: Payer: Self-pay | Admitting: Physical Therapy

## 2021-08-13 ENCOUNTER — Ambulatory Visit (INDEPENDENT_AMBULATORY_CARE_PROVIDER_SITE_OTHER): Payer: Medicare Other | Admitting: Physical Therapy

## 2021-08-13 DIAGNOSIS — G8929 Other chronic pain: Secondary | ICD-10-CM

## 2021-08-13 DIAGNOSIS — R2689 Other abnormalities of gait and mobility: Secondary | ICD-10-CM

## 2021-08-13 DIAGNOSIS — M6281 Muscle weakness (generalized): Secondary | ICD-10-CM | POA: Diagnosis not present

## 2021-08-13 DIAGNOSIS — M545 Low back pain, unspecified: Secondary | ICD-10-CM

## 2021-08-13 NOTE — Therapy (Signed)
[facility] [address] [place], [place], [address] Phone: [phone]   Fax:  [phone]  Physical Therapy Treatment  Patient Details  Name: Bruce Ball MRN: [id number] Date of Birth: 07-Oct-1946 Referring Provider (PT): [name]   Encounter Date: 08/13/2021   PT End of Session - 08/13/21 0844     Visit Number 4    Number of Visits 12    Date for PT Re-Evaluation 09/10/21    Authorization Type Medicare    PT Start Time 0845    PT Stop Time 0930    PT Time Calculation (min) 45 min    Activity Tolerance Patient limited by pain;Patient tolerated treatment well    Behavior During Therapy [place] for tasks assessed/performed             Past Medical History:  Diagnosis Date   Ankylosing spondylitis ([place])    Arthritis of right knee    Basal cell carcinoma    Chronic back pain greater than 3 months duration    Chronic leg pain    CKD (chronic kidney disease), stage II    Coronary artery calcification seen on CAT scan 03/27/2021   Deafness in right ear    Dyspnea    cause by PE   Essential tremor    Extrinsic asthma, unspecified 04/07/2009   PT DENIES   GERD (gastroesophageal reflux disease)    Hearing loss    Hip problem    History of kidney stones    HYPERTENSION 10/29/2007   HYPOTENSION 08/13/2010   LOW BACK PAIN 04/21/2008   Mild aortic stenosis    NEPHROLITHIASIS, HX OF 04/21/2008   Neuropathy    Osteopenia    Other hyperlipidemia    Peptic ulcer    Pneumonia    PUD, HX OF 04/21/2008   Pulmonary emboli ([place]) 2021   noted on CT 12-2019   Renal disorder    Stage 3 chronic kidney disease (HCC)    Thyroid nodule     Past Surgical History:  Procedure Laterality Date   BACK SURGERY     bone cancer  1970   rt femur removed and steel rod placed   [place], URETEROSCOPY AND STENT PLACEMENT Right 11/06/2017   Procedure: CYSTOSCOPY WITH  STENT PLACEMENT RIGHT;  Surgeon: Raynelle Bring, MD;  Location: [facility];  Service: Urology;  Laterality: Right;   CYSTOSCOPY WITH RETROGRADE PYELOGRAM, URETEROSCOPY AND STENT PLACEMENT Left 06/09/2019   Procedure: CYSTOSCOPY WITH RETROGRADE PYELOGRAM, URETEROSCOPY AND STENT PLACEMENT X2;  Surgeon: [name], MD;  [facility]: [facility];  Service: Urology;  Laterality: Left;   CYSTOSCOPY WITH RETROGRADE PYELOGRAM, URETEROSCOPY AND STENT PLACEMENT Bilateral 03/01/2020   Procedure: CYSTOSCOPY WITH RETROGRADE PYELOGRAM, URETEROSCOPY AND STENT PLACEMENT;  Surgeon: [name], MD;  [facility]: [facility];  Service: Urology;  Laterality: Bilateral;  75 MINS   CYSTOSCOPY/RETROGRADE/URETEROSCOPY     1 year ago at [place] in [place] Right 11/24/2017   Procedure: CYSTOSCOPY/URETEROSCOPY/ RETROGRADE/STENT REMOVAL;  Surgeon: Raynelle Bring, MD;  Location: [facility];  Service: Urology;  Laterality: Right;   HOLMIUM LASER APPLICATION Bilateral [id number]   Procedure: HOLMIUM LASER APPLICATION;  Surgeon: [name], MD;  Location: [facility];  Service: Urology;  Laterality: Bilateral;   prosatectomy      There were no vitals filed for this visit.   Subjective Assessment - 08/13/21 0845     Subjective Patient with no back pain today.    Pertinent History prev  bone CA in R hip, Previous prostate CA, CAD, CKD, Ankylosing spondylitis    Patient Stated Goals decreased back pain.    Currently in Pain? No/denies                               [facility] Adult PT Treatment/Exercise - 08/13/21 0001       Self-Care   Self-Care Other Self-Care Comments    Other Self-Care Comments  Self MFR using ball to lumbar and gluteals      Lumbar Exercises: Stretches   Single Knee to Chest Stretch Right;Left;1 rep;20 seconds    Other Lumbar Stretch Exercise seated fwd flexion 20 sec x 1;   after supine to sit     Lumbar Exercises: Standing   Other Standing Lumbar Exercises At Counter: March x 20; Hip abd x 20 bil;   Mini Squats x 20: ext x 20 bil   cues on squat to sit back     Lumbar Exercises: Seated   Long Arc Quad on Chair 20 reps    LAQ on Chair Weights (lbs) 5    Sit to Stand Limitations 2x 5 from higher mat table.    Other Seated Lumbar Exercises Rows GTB x 20;    Other Seated Lumbar Exercises Pelvic tilts x 20; Clams Bl  green TB x 20;      Lumbar Exercises: Supine   Pelvic Tilt 5 reps    Bridge 20 reps;Non-compliant      Lumbar Exercises: Sidelying   Clam Right;10 reps      Manual Therapy   Manual Therapy Myofascial release    Manual therapy comments in left SDLY    Myofascial Release TPR to right QL and gluteals                    PT Education - 08/13/21 0935     Education Details self care: self MFR with ball to lumbar and gluteals    Person(s) Educated Patient    Methods Explanation;Demonstration    Comprehension Verbalized understanding;Returned demonstration              PT Short Term Goals - 08/02/21 1457       PT SHORT TERM GOAL #1   Title Pt to be independent wtih final HEP    Time 6    Period Weeks    Status New    Target Date 09/10/21      PT SHORT TERM GOAL #2   Title Pt to report decreased pain in lumbar to 0-3/10 with standing activity and transfers.    Time 6    Period Weeks    Status New    Target Date 09/10/21      PT SHORT TERM GOAL #3   Title Pt to demo improved ability and mechanics for ambulation, with improved step height , LRAD, and balance/safety WNL for pt age.    Time 6    Period Weeks    Status New    Target Date 09/10/21      PT SHORT TERM GOAL #4   Title Pt to have improved score on TUG (time TBD after testing )    Time 6    Period Weeks    Status New    Target Date 09/10/21                      Plan - 08/13/21 1240  Clinical Impression Statement Patient with two episodes of LOB today. Once when doing standing marching (secondary to sharp back pain) and secondly after standing following manual  therapy. He did not have AD with him today. He responded well to [facility] and was educated in self MFR using a ball which gave him relief. Marked pain with sit<>supine transfers and with IASTM to right gluteus minimus. Plan to continue lumbar exercises for pain relief and progress strength and balance as tolerated.    Comorbidities prev bone CA in R hip, Previous prostate CA, CAD, CKD, Ankylosing spondylitis, Lumbar surgery    PT Treatment/Interventions ADLs/Self Care Home Management;Cryotherapy;Electrical Stimulation;Iontophoresis '4mg'$ /ml Dexamethasone;Moist Heat;Traction;Ultrasound;DME Instruction;Gait training;Stair training;Functional mobility training;Therapeutic activities;Therapeutic exercise;Balance training;Neuromuscular re-education;Patient/family education;Manual techniques;Passive range of motion;Dry needling;Energy conservation;Taping;Spinal Manipulations;Joint Manipulations    PT Home Exercise Plan [phone]             Patient will benefit from skilled therapeutic intervention in order to improve the following deficits and impairments:  Abnormal gait, Pain, Improper body mechanics, Increased muscle spasms, Decreased mobility, Decreased coordination, Decreased activity tolerance, Decreased endurance, Decreased range of motion, Decreased strength, Hypomobility, Impaired flexibility, Difficulty walking, Decreased balance, Decreased safety awareness  Visit Diagnosis: Chronic bilateral low back pain without sciatica  Muscle weakness (generalized)  Other abnormalities of gait and mobility     Problem List Patient Active Problem List   Diagnosis Date Noted   Other fatigue 06/05/2021   CAD (coronary artery disease) Q000111Q   Diastolic CHF ([place]) 123XX123   SOB (shortness of breath) on exertion 03/26/2021   Chronic anticoagulation 03/26/2021   Anemia in chronic kidney disease 01/25/2021   Acquired coagulation disorder ([place]) 12/12/2020   Senile purpura ([place]) 12/12/2020   Thin skin  07/25/2020   Aortic atherosclerosis ([place]) 05/17/2020   B12 deficiency 05/17/2020   Iliac artery aneurysm ([place]) 05/17/2020   Osteoporosis 04/04/2020   Idiopathic neuropathy 01/28/2020   Recurrent falls 01/28/2020   Chronic kidney disease (CKD), stage III (moderate) (HCC) 01/19/2020   Pulmonary emboli (HCC) 01/14/2020   GERD (gastroesophageal reflux disease) 12/22/2019   Essential tremor 12/22/2019   Femoral condyle fracture ([place]) 07/15/2019   Gross hematuria 06/14/2019   Hyperlipidemia 11/11/2017   Community acquired pneumonia of right upper lobe of lung 11/05/2017   Right ureteral stone 11/05/2017   History of total right hip replacement 03/08/2017   History of prostate cancer 03/08/2017   Hx of chondrosarcoma 02/28/2017   High risk medication use 02/28/2017   DDD (degenerative disc disease), thoracic 02/28/2017   Ataxia 05/18/2016   Vestibular disequilibrium 05/18/2016   Ankylosing spondylitis ([place]) 05/24/2014   Lung nodule, solitary 05/24/2014   Peripheral edema 06/08/2012   Extrinsic asthma 04/07/2009   LOW BACK PAIN 04/21/2008   History of peptic ulcer disease 04/21/2008   Primary hypertension 10/29/2007   Madelyn Flavors PT 08/13/2021, 12:46 PM  [facility] [address] [place], [place], [address] Phone: [phone]   Fax:  [phone]  Name: Bruce Ball MRN: [id number] Date of Birth: 10-Sep-1946

## 2021-08-15 ENCOUNTER — Encounter: Payer: Medicare Other | Admitting: Physical Therapy

## 2021-08-20 ENCOUNTER — Ambulatory Visit (INDEPENDENT_AMBULATORY_CARE_PROVIDER_SITE_OTHER): Payer: Medicare Other | Admitting: Family Medicine

## 2021-08-20 ENCOUNTER — Encounter: Payer: Medicare Other | Admitting: Physical Therapy

## 2021-08-23 ENCOUNTER — Emergency Department (HOSPITAL_BASED_OUTPATIENT_CLINIC_OR_DEPARTMENT_OTHER): Payer: Medicare Other | Admitting: Radiology

## 2021-08-23 ENCOUNTER — Encounter (HOSPITAL_BASED_OUTPATIENT_CLINIC_OR_DEPARTMENT_OTHER): Payer: Self-pay | Admitting: Emergency Medicine

## 2021-08-23 ENCOUNTER — Emergency Department (HOSPITAL_BASED_OUTPATIENT_CLINIC_OR_DEPARTMENT_OTHER): Payer: Medicare Other

## 2021-08-23 ENCOUNTER — Ambulatory Visit (INDEPENDENT_AMBULATORY_CARE_PROVIDER_SITE_OTHER): Payer: Medicare Other | Admitting: Physical Therapy

## 2021-08-23 ENCOUNTER — Emergency Department (HOSPITAL_BASED_OUTPATIENT_CLINIC_OR_DEPARTMENT_OTHER)
Admission: EM | Admit: 2021-08-23 | Discharge: 2021-08-23 | Disposition: A | Discharge status: Home / Self Care | Payer: Medicare Other | Attending: Emergency Medicine | Admitting: Emergency Medicine

## 2021-08-23 ENCOUNTER — Other Ambulatory Visit: Payer: Self-pay

## 2021-08-23 DIAGNOSIS — W19XXXA Unspecified fall, initial encounter: Secondary | ICD-10-CM

## 2021-08-23 DIAGNOSIS — S40011A Contusion of right shoulder, initial encounter: Secondary | ICD-10-CM | POA: Diagnosis not present

## 2021-08-23 DIAGNOSIS — R2689 Other abnormalities of gait and mobility: Secondary | ICD-10-CM | POA: Diagnosis not present

## 2021-08-23 DIAGNOSIS — M542 Cervicalgia: Secondary | ICD-10-CM | POA: Diagnosis not present

## 2021-08-23 DIAGNOSIS — Z7901 Long term (current) use of anticoagulants: Secondary | ICD-10-CM | POA: Diagnosis not present

## 2021-08-23 DIAGNOSIS — S0990XA Unspecified injury of head, initial encounter: Secondary | ICD-10-CM | POA: Insufficient documentation

## 2021-08-23 DIAGNOSIS — I130 Hypertensive heart and chronic kidney disease with heart failure and stage 1 through stage 4 chronic kidney disease, or unspecified chronic kidney disease: Secondary | ICD-10-CM | POA: Diagnosis not present

## 2021-08-23 DIAGNOSIS — M6281 Muscle weakness (generalized): Secondary | ICD-10-CM | POA: Diagnosis not present

## 2021-08-23 DIAGNOSIS — M545 Low back pain, unspecified: Secondary | ICD-10-CM | POA: Diagnosis not present

## 2021-08-23 DIAGNOSIS — Z87891 Personal history of nicotine dependence: Secondary | ICD-10-CM | POA: Insufficient documentation

## 2021-08-23 DIAGNOSIS — N183 Chronic kidney disease, stage 3 unspecified: Secondary | ICD-10-CM | POA: Diagnosis not present

## 2021-08-23 DIAGNOSIS — Z8546 Personal history of malignant neoplasm of prostate: Secondary | ICD-10-CM | POA: Insufficient documentation

## 2021-08-23 DIAGNOSIS — G8929 Other chronic pain: Secondary | ICD-10-CM | POA: Diagnosis not present

## 2021-08-23 DIAGNOSIS — R519 Headache, unspecified: Secondary | ICD-10-CM | POA: Diagnosis not present

## 2021-08-23 DIAGNOSIS — J45909 Unspecified asthma, uncomplicated: Secondary | ICD-10-CM | POA: Diagnosis not present

## 2021-08-23 DIAGNOSIS — I2510 Atherosclerotic heart disease of native coronary artery without angina pectoris: Secondary | ICD-10-CM | POA: Diagnosis not present

## 2021-08-23 DIAGNOSIS — W01198A Fall on same level from slipping, tripping and stumbling with subsequent striking against other object, initial encounter: Secondary | ICD-10-CM | POA: Diagnosis not present

## 2021-08-23 DIAGNOSIS — M898X1 Other specified disorders of bone, shoulder: Secondary | ICD-10-CM

## 2021-08-23 DIAGNOSIS — S0093XA Contusion of unspecified part of head, initial encounter: Secondary | ICD-10-CM | POA: Diagnosis not present

## 2021-08-23 DIAGNOSIS — Y9289 Other specified places as the place of occurrence of the external cause: Secondary | ICD-10-CM | POA: Diagnosis not present

## 2021-08-23 DIAGNOSIS — I5030 Unspecified diastolic (congestive) heart failure: Secondary | ICD-10-CM | POA: Insufficient documentation

## 2021-08-23 DIAGNOSIS — S098XXA Other specified injuries of head, initial encounter: Secondary | ICD-10-CM

## 2021-08-23 DIAGNOSIS — M25511 Pain in right shoulder: Secondary | ICD-10-CM | POA: Diagnosis not present

## 2021-08-23 NOTE — ED Triage Notes (Signed)
Pt presents to ED POV. Pt c/o head, neck, and R shoulder pain. Pt had mechanical fall yesterday and hit head on side of table. Pt taking eliquis.

## 2021-08-23 NOTE — ED Provider Notes (Signed)
[facility] EMERGENCY DEPT Provider Note   CSN: [id number] Arrival date & time: 08/23/21  1628     History Chief Complaint  Patient presents with   Bruce Ball    Bruce Ball is a 75 y.o. male.  Pt is a 75 yo male presenting for fall that occurred at 3PM yesterday. Pt states he was attempting to put his shoes on when he lost balance, fell backwards, hitting the back of his head and right shoulder blade on a side table. On eliquis for PE. Denies LOC. States he was down for 45 minutes before he was able to get up because of pain.   The history is provided by the patient. No language interpreter was used.  Fall This is a new problem. The current episode started yesterday. The problem has not changed since onset.Associated symptoms include headaches. Pertinent negatives include no chest pain, no abdominal pain and no shortness of breath.      Past Medical History:  Diagnosis Date   Ankylosing spondylitis ([place])    Arthritis of right knee    Basal cell carcinoma    Chronic back pain greater than 3 months duration    Chronic leg pain    CKD (chronic kidney disease), stage II    Coronary artery calcification seen on CAT scan 03/27/2021   Deafness in right ear    Dyspnea    cause by PE   Essential tremor    Extrinsic asthma, unspecified 04/07/2009   PT DENIES   GERD (gastroesophageal reflux disease)    Hearing loss    Hip problem    History of kidney stones    HYPERTENSION 10/29/2007   HYPOTENSION 08/13/2010   LOW BACK PAIN 04/21/2008   Mild aortic stenosis    NEPHROLITHIASIS, HX OF 04/21/2008   Neuropathy    Osteopenia    Other hyperlipidemia    Peptic ulcer    Pneumonia    PUD, HX OF 04/21/2008   Pulmonary emboli ([place]) 2021   noted on CT 12-2019   Renal disorder    Stage 3 chronic kidney disease (HCC)    Thyroid nodule     Patient Active Problem List   Diagnosis Date Noted   Other fatigue 06/05/2021   CAD (coronary artery disease) Q000111Q   Diastolic  CHF ([place]) 123XX123   SOB (shortness of breath) on exertion 03/26/2021   Chronic anticoagulation 03/26/2021   Anemia in chronic kidney disease 01/25/2021   Acquired coagulation disorder ([place]) 12/12/2020   Senile purpura ([place]) 12/12/2020   Thin skin 07/25/2020   Aortic atherosclerosis ([place]) 05/17/2020   B12 deficiency 05/17/2020   Iliac artery aneurysm ([place]) 05/17/2020   Osteoporosis 04/04/2020   Idiopathic neuropathy 01/28/2020   Recurrent falls 01/28/2020   Chronic kidney disease (CKD), stage III (moderate) ([place]) 01/19/2020   Pulmonary emboli ([place]) 01/14/2020   GERD (gastroesophageal reflux disease) 12/22/2019   Essential tremor 12/22/2019   Femoral condyle fracture ([place]) 07/15/2019   Gross hematuria 06/14/2019   Hyperlipidemia 11/11/2017   Community acquired pneumonia of right upper lobe of lung 11/05/2017   Right ureteral stone 11/05/2017   History of total right hip replacement 03/08/2017   History of prostate cancer 03/08/2017   Hx of chondrosarcoma 02/28/2017   High risk medication use 02/28/2017   DDD (degenerative disc disease), thoracic 02/28/2017   Ataxia 05/18/2016   Vestibular disequilibrium 05/18/2016   Ankylosing spondylitis ([place]) 05/24/2014   Lung nodule, solitary 05/24/2014   Peripheral edema 06/08/2012   Extrinsic asthma 04/07/2009  LOW BACK PAIN 04/21/2008   History of peptic ulcer disease 04/21/2008   Primary hypertension 10/29/2007    Past Surgical History:  Procedure Laterality Date   BACK SURGERY     bone cancer  1970   rt femur removed and steel rod placed   [place], URETEROSCOPY AND STENT PLACEMENT Right 11/06/2017   Procedure: CYSTOSCOPY WITH  STENT PLACEMENT RIGHT;  Surgeon: Raynelle Bring, MD;  [facility];  Service: Urology;  Laterality: Right;   CYSTOSCOPY WITH RETROGRADE PYELOGRAM, URETEROSCOPY AND STENT PLACEMENT Left 06/09/2019   Procedure: CYSTOSCOPY WITH RETROGRADE PYELOGRAM, URETEROSCOPY AND STENT  PLACEMENT X2;  Surgeon: [name], MD;  [facility];  Service: Urology;  Laterality: Left;   CYSTOSCOPY WITH RETROGRADE PYELOGRAM, URETEROSCOPY AND STENT PLACEMENT Bilateral 03/01/2020   Procedure: CYSTOSCOPY WITH RETROGRADE PYELOGRAM, URETEROSCOPY AND STENT PLACEMENT;  Surgeon: [name], MD;  [facility];  Service: Urology;  Laterality: Bilateral;  75 MINS   CYSTOSCOPY/RETROGRADE/URETEROSCOPY     1 year ago at [place] in [place] Right 11/24/2017   Procedure: CYSTOSCOPY/URETEROSCOPY/ RETROGRADE/STENT REMOVAL;  Surgeon: Raynelle Bring, MD;  [facility];  Service: Urology;  Laterality: Right;   HOLMIUM LASER APPLICATION Bilateral 123XX123   Procedure: HOLMIUM LASER APPLICATION;  Surgeon: [name], MD;  [facility];  Service: Urology;  Laterality: Bilateral;   prosatectomy         Family History  Problem Relation Age of Onset   COPD Father    Hypertension Father    Cancer Other        prostate   Prostate cancer Brother    Esophageal cancer Neg Hx    Stomach cancer Neg Hx    Pancreatic cancer Neg Hx    Colon cancer Neg Hx     Social History   Tobacco Use   Smoking status: Former    Packs/day: 1.00    Years: 17.00    Pack years: 17.00    Types: Cigarettes    Start date: 1963    Quit date: 12/30/1978    Years since quitting: 42.6   Smokeless tobacco: Never  Vaping Use   Vaping Use: Never used  Substance Use Topics   Alcohol use: No   Drug use: No    Home Medications Prior to Admission medications   Medication Sig Start Date End Date Taking? Authorizing Provider  apixaban (ELIQUIS) 2.5 MG TABS tablet Take 1 tablet (2.5 mg total) by mouth 2 (two) times daily. 01/25/21  Yes Gorsuch, Ni, MD  atorvastatin (LIPITOR) 20 MG tablet Take 1 tablet (20 mg total) by mouth at bedtime. 07/19/21  Yes Turner, Eber Hong, MD  carvedilol (COREG) 25 MG tablet Take 25 mg by mouth 2 (two) times daily.   Yes [provider]  Cholecalciferol (VITAMIN D3) 50 MCG (2000 UT) capsule Take 2,000 Units by mouth in the morning and at bedtime.    Yes [provider]  indapamide (LOZOL) 1.25 MG tablet Take 1.25 mg by mouth daily. 03/09/20  Yes [provider]  metoCLOPramide (REGLAN) 5 MG tablet TAKE 1 TABLET (5 MG TOTAL) BY MOUTH 3 (THREE) TIMES DAILY BEFORE MEALS. 04/24/20  Yes Armbruster, Carlota Raspberry, MD  acetaminophen (TYLENOL) 325 MG tablet Take 2 tablets (650 mg total) by mouth every 6 (six) hours as needed for mild pain or moderate pain. 03/29/21   Hongalgi, Lenis Dickinson, MD    Allergies    Patient has no known allergies.  Review  of Systems   Review of Systems  Constitutional:  Negative for chills and fever.  HENT:  Negative for ear pain and sore throat.   Eyes:  Negative for pain and visual disturbance.  Respiratory:  Negative for cough and shortness of breath.   Cardiovascular:  Negative for chest pain and palpitations.  Gastrointestinal:  Negative for abdominal pain and vomiting.  Genitourinary:  Negative for dysuria and hematuria.  Musculoskeletal:  Negative for arthralgias and back pain.       Right shoulder blade pain   Skin:  Negative for color change and rash.  Neurological:  Positive for headaches. Negative for seizures and syncope.  All other systems reviewed and are negative.  Physical Exam Updated Vital Signs BP 140/68   Pulse 61   Temp 98.4 F (36.9 C) (Oral)   Resp 15   Ht '5\' 11"'$  (1.803 m)   Wt 104.3 kg   SpO2 99%   BMI 32.08 kg/m   Physical Exam Vitals and nursing note reviewed.  Constitutional:      Appearance: He is well-developed.  HENT:     Head: Normocephalic and atraumatic.  Eyes:     Conjunctiva/sclera: Conjunctivae normal.  Cardiovascular:     Rate and Rhythm: Normal rate and regular rhythm.     Heart sounds: No murmur heard. Pulmonary:     Effort: Pulmonary effort is normal. No respiratory distress.     Breath sounds: Normal breath sounds.   Abdominal:     Palpations: Abdomen is soft.     Tenderness: There is no abdominal tenderness.  Musculoskeletal:       Arms:     Cervical back: Neck supple. No bony tenderness.     Thoracic back: No bony tenderness.     Lumbar back: No bony tenderness.  Skin:    General: Skin is warm and dry.  Neurological:     Mental Status: He is alert.     GCS: GCS eye subscore is 4. GCS verbal subscore is 5. GCS motor subscore is 6.     Cranial Nerves: Cranial nerves are intact.     Sensory: Sensation is intact.     Motor: Motor function is intact.     Coordination: Coordination is intact.     Gait: Gait is intact.     Comments: Walks with walker baseline     ED Results / Procedures / Treatments   Labs (all labs ordered are listed, but only abnormal results are displayed) Labs Reviewed - No data to display  EKG None  Radiology CT HEAD WO CONTRAST (5MM)  Result Date: 08/23/2021 CLINICAL DATA:  Facial trauma Patient reports mechanical fall yesterday striking head on table. Head, neck, and right shoulder pain. EXAM: CT HEAD WITHOUT CONTRAST TECHNIQUE: Contiguous axial images were obtained from the base of the skull through the vertex without intravenous contrast. COMPARISON:  Head CT 11/24/2020 FINDINGS: Brain: No intracranial hemorrhage, mass effect, or midline shift. Stable brain volume, normal for age. No hydrocephalus. The basilar cisterns are patent. Similar periventricular and deep white matter hypodensity typical of chronic small vessel ischemia. No evidence of territorial infarct or acute ischemia. No extra-axial or intracranial fluid collection. Vascular: Atherosclerosis of skullbase vasculature without hyperdense vessel or abnormal calcification. Skull: No fracture or focal lesion. Sinuses/Orbits: There is mucosal thickening throughout the frontal sinuses, ethmoid air cells, left and right sphenoid sinus that is new from prior exam. No evidence of fracture or sinus fluid level. Bilateral  cataract resection no mastoid effusion.  Other: No confluent scalp hematoma. IMPRESSION: 1. No acute intracranial abnormality. No skull fracture. 2. Unchanged chronic small vessel ischemia. 3. New paranasal sinus mucosal thickening. Electronically Signed   By: Keith Rake M.D.   On: 08/23/2021 18:07   CT Cervical Spine Wo Contrast  Result Date: 08/23/2021 CLINICAL DATA:  Facial trauma Patient reports mechanical fall yesterday striking head on table. Head, neck, and right shoulder pain. EXAM: CT CERVICAL SPINE WITHOUT CONTRAST TECHNIQUE: Multidetector CT imaging of the cervical spine was performed without intravenous contrast. Multiplanar CT image reconstructions were also generated. COMPARISON:  Cervical spine CT 04/02/2020 FINDINGS: Alignment: Normal. Skull base and vertebrae: No acute fracture. Vertebral body heights are maintained. The dens and skull base are intact. Soft tissues and spinal canal: No prevertebral fluid or swelling. No visible canal hematoma. Disc levels: Degenerative disc disease C3-C4, C5-C6 and C6-C7. There is mild multilevel facet hypertrophy. No high-grade canal stenosis. Upper chest: No acute or unexpected findings. Other: None. IMPRESSION: Degenerative change in the cervical spine without acute fracture or subluxation. Electronically Signed   By: Keith Rake M.D.   On: 08/23/2021 18:10    Procedures Procedures   Medications Ordered in ED Medications - No data to display  ED Course  I have reviewed the triage vital signs and the nursing notes.  Pertinent labs & imaging results that were available during my care of the patient were reviewed by me and considered in my medical decision making (see chart for details).    MDM Rules/Calculators/A&P                          7:09 PM 75 yo male presenting for fall that occurred at 3PM yesterday. Pt is Aox3, no acute distress, afebrile, with stable vitals. No midline spinal tenderness. No neurovascular deficits. CT head  and neck stable. Pt has pain and ecchymosis over right scapula. Xray demonstrates no fractures. Pt declines pain medication at this time.  Patient in no distress and overall condition improved here in the ED. Detailed discussions were had with the patient regarding current findings, and need for close f/u with PCP or on call doctor. The patient has been instructed to return immediately if the symptoms worsen in any way for re-evaluation. Patient verbalized understanding and is in agreement with current care plan. All questions answered prior to discharge.        Final Clinical Impression(s) / ED Diagnoses Final diagnoses:  Pain of right scapula  Fall, initial encounter  Blunt head trauma, initial encounter    Rx / DC Orders ED Discharge Orders     None        Lianne Cure, DO Q000111Q 2011

## 2021-08-26 ENCOUNTER — Encounter: Payer: Self-pay | Admitting: Physical Therapy

## 2021-08-26 NOTE — Therapy (Addendum)
[facility] [address] [place], [place], [address] Phone: [phone]   Fax:  [phone]  Physical Therapy Treatment  Patient Details  Name: MARCQUEZ Ball MRN: [id number] Date of Birth: Jul 17, 1946 Referring Provider (PT): [name]   Encounter Date: 08/23/2021   PT End of Session - 08/26/21 2134     Visit Number 5    Number of Visits 12    Date for PT Re-Evaluation 09/10/21    Authorization Type Medicare    PT Start Time [id number]    PT Stop Time 1603    PT Time Calculation (min) 48 min    Activity Tolerance Patient limited by pain;Patient tolerated treatment well    Behavior During Therapy [facility] for tasks assessed/performed             Past Medical History:  Diagnosis Date   Ankylosing spondylitis ([place])    Arthritis of right knee    Basal cell carcinoma    Chronic back pain greater than 3 months duration    Chronic leg pain    CKD (chronic kidney disease), stage II    Coronary artery calcification seen on CAT scan 03/27/2021   Deafness in right ear    Dyspnea    cause by PE   Essential tremor    Extrinsic asthma, unspecified 04/07/2009   PT DENIES   GERD (gastroesophageal reflux disease)    Hearing loss    Hip problem    History of kidney stones    HYPERTENSION 10/29/2007   HYPOTENSION 08/13/2010   LOW BACK PAIN 04/21/2008   Mild aortic stenosis    NEPHROLITHIASIS, HX OF 04/21/2008   Neuropathy    Osteopenia    Other hyperlipidemia    Peptic ulcer    Pneumonia    PUD, HX OF 04/21/2008   Pulmonary emboli ([place]) 2021   noted on CT 12-2019   Renal disorder    Stage 3 chronic kidney disease (HCC)    Thyroid nodule     Past Surgical History:  Procedure Laterality Date   BACK SURGERY     bone cancer  1970   rt femur removed and steel rod placed   [place], URETEROSCOPY AND STENT PLACEMENT Right 11/06/2017   Procedure: CYSTOSCOPY WITH  STENT PLACEMENT RIGHT;  Surgeon: Raynelle Bring, MD;  Location: [facility];  Service: Urology;  Laterality: Right;   CYSTOSCOPY WITH RETROGRADE PYELOGRAM, URETEROSCOPY AND STENT PLACEMENT Left 06/09/2019   Procedure: CYSTOSCOPY WITH RETROGRADE PYELOGRAM, URETEROSCOPY AND STENT PLACEMENT X2;  Surgeon: [name], MD;  Location: [facility];  Service: Urology;  Laterality: Left;   CYSTOSCOPY WITH RETROGRADE PYELOGRAM, URETEROSCOPY AND STENT PLACEMENT Bilateral 03/01/2020   Procedure: CYSTOSCOPY WITH RETROGRADE PYELOGRAM, URETEROSCOPY AND STENT PLACEMENT;  Surgeon: [name], MD;  Location: [facility];  Service: Urology;  Laterality: Bilateral;  75 MINS   CYSTOSCOPY/RETROGRADE/URETEROSCOPY     1 year ago at [facility] in [place] Right 11/24/2017   Procedure: CYSTOSCOPY/URETEROSCOPY/ RETROGRADE/STENT REMOVAL;  Surgeon: Raynelle Bring, MD;  Location: [facility];  Service: Urology;  Laterality: Right;   HOLMIUM LASER APPLICATION Bilateral [id number]   Procedure: HOLMIUM LASER APPLICATION;  Surgeon: [name], MD;  Location: [facility];  Service: Urology;  Laterality: Bilateral;   prosatectomy      There were no vitals filed for this visit.   Subjective Assessment - 08/26/21 2132     Subjective Pt states back pain is a bit better. He also notes he  had a fall yesterday, tripped as he was taking his shoes off, did hit his head. No obvious laceration or bruising today. Does have bruise on R posterior shoulder. States mild pain in R hip, and also states feeling a little "off". Denies dizziness.    Currently in Pain? Yes    Pain Score 3     Pain Location Hip    Pain Orientation Right    Pain Descriptors / Indicators Aching    Pain Type Acute pain    Pain Onset Yesterday    Pain Frequency Intermittent                                         PT Short Term Goals - 08/02/21 1457       PT SHORT TERM GOAL #1   Title Pt to be independent wtih final HEP    Time 6    Period Weeks     Status New    Target Date 09/10/21      PT SHORT TERM GOAL #2   Title Pt to report decreased pain in lumbar to 0-3/10 with standing activity and transfers.    Time 6    Period Weeks    Status New    Target Date 09/10/21      PT SHORT TERM GOAL #3   Title Pt to demo improved ability and mechanics for ambulation, with improved step height , LRAD, and balance/safety WNL for pt age.    Time 6    Period Weeks    Status New    Target Date 09/10/21      PT SHORT TERM GOAL #4   Title Pt to have improved score on TUG (time TBD after testing )    Time 6    Period Weeks    Status New    Target Date 09/10/21                      Plan - 08/26/21 2136     Clinical Impression Statement Mild activities performed today, mostly in sitting. Decreased standing/balance exercises, due to reports of fall yesterday. Pt with no other sympotms but slight soreness in hip and shoulder.  Pt with tenderness in R Low lumbar/SI region with palpation and manual today.  Discussed use of SPC for balance, even at home, at all times, to decrease fall risk. Also discussed sitting down to don/doff clothing and shoes to decrease fall risk. Due to report of falling and hitting head, it was recommended that pt go to ER for further assessment.    Comorbidities prev bone CA in R hip, Previous prostate CA, CAD, CKD, Ankylosing spondylitis, Lumbar surgery    PT Treatment/Interventions ADLs/Self Care Home Management;Cryotherapy;Electrical Stimulation;Iontophoresis '4mg'$ /ml Dexamethasone;Moist Heat;Traction;Ultrasound;DME Instruction;Gait training;Stair training;Functional mobility training;Therapeutic activities;Therapeutic exercise;Balance training;Neuromuscular re-education;Patient/family education;Manual techniques;Passive range of motion;Dry needling;Energy conservation;Taping;Spinal Manipulations;Joint Manipulations    PT Home Exercise Plan [phone]             Patient will benefit from skilled therapeutic  intervention in order to improve the following deficits and impairments:  Abnormal gait, Pain, Improper body mechanics, Increased muscle spasms, Decreased mobility, Decreased coordination, Decreased activity tolerance, Decreased endurance, Decreased range of motion, Decreased strength, Hypomobility, Impaired flexibility, Difficulty walking, Decreased balance, Decreased safety awareness  Visit Diagnosis: Chronic bilateral low back pain without sciatica  Muscle weakness (generalized)  Other abnormalities of gait  and mobility     Problem List Patient Active Problem List   Diagnosis Date Noted   Other fatigue 06/05/2021   CAD (coronary artery disease) [id number]   Diastolic CHF ([place]) [id number]   SOB (shortness of breath) on exertion 03/26/2021   Chronic anticoagulation 03/26/2021   Anemia in chronic kidney disease 01/25/2021   Acquired coagulation disorder ([place]) 12/12/2020   Senile purpura ([place]) 12/12/2020   Thin skin 07/25/2020   Aortic atherosclerosis ([place]) 05/17/2020   B12 deficiency 05/17/2020   Iliac artery aneurysm ([place]) 05/17/2020   Osteoporosis 04/04/2020   Idiopathic neuropathy 01/28/2020   Recurrent falls 01/28/2020   Chronic kidney disease (CKD), stage III (moderate) ([place]) 01/19/2020   Pulmonary emboli ([place]) 01/14/2020   GERD (gastroesophageal reflux disease) 12/22/2019   Essential tremor 12/22/2019   Femoral condyle fracture ([place]) 07/15/2019   Gross hematuria 06/14/2019   Hyperlipidemia 11/11/2017   Community acquired pneumonia of right upper lobe of lung 11/05/2017   Right ureteral stone 11/05/2017   History of total right hip replacement 03/08/2017   History of prostate cancer 03/08/2017   Hx of chondrosarcoma 02/28/2017   High risk medication use 02/28/2017   DDD (degenerative disc disease), thoracic 02/28/2017   Ataxia 05/18/2016   Vestibular disequilibrium 05/18/2016   Ankylosing spondylitis ([place]) 05/24/2014   Lung nodule, solitary 05/24/2014   Peripheral  edema 06/08/2012   Extrinsic asthma 04/07/2009   LOW BACK PAIN 04/21/2008   History of peptic ulcer disease 04/21/2008   Primary hypertension 10/29/2007   Lyndee Hensen, PT, DPT 9:42 PM  08/26/21    [facility] [address] [place], [place], [address] Phone: [phone]   Fax:  [phone]  Name: ALTON SEIVERT MRN: [id number] Date of Birth: 22-Mar-1946  PHYSICAL THERAPY DISCHARGE SUMMARY  Visits from Start of Care: 5 Plan: Patient agrees to discharge.  Patient goals were partially  met. Patient is being discharged due to  Pt did not return after last visit  Lyndee Hensen, PT, DPT 11:20 AM  02/05/23

## 2021-08-27 NOTE — Progress Notes (Signed)
Phone [phone] In person visit   Subjective:   Bruce Ball is a 75 y.o. year old very pleasant male patient who presents for/with See problem oriented charting Chief Complaint  Patient presents with   Nasal Congestion    Head congestion    Runny Nose   Hand Pain    Bilateral     This visit occurred during the SARS-CoV-2 public health emergency.  Safety protocols were in place, including screening questions prior to the visit, additional usage of staff PPE, and extensive cleaning of exam room while observing appropriate contact time as indicated for disinfecting solutions.   Past Medical History-  Patient Active Problem List   Diagnosis Date Noted   CAD (coronary artery disease) 04/03/2021    Priority: High   Diastolic CHF ([place]) 123XX123    Priority: High   Aortic atherosclerosis ([place]) 05/17/2020    Priority: High   Idiopathic neuropathy 01/28/2020    Priority: High   Recurrent falls 01/28/2020    Priority: High   Pulmonary emboli ([place]) 01/14/2020    Priority: High   High risk medication use 02/28/2017    Priority: High   Ataxia 05/18/2016    Priority: High   Vestibular disequilibrium 05/18/2016    Priority: High   Ankylosing spondylitis ([place]) 05/24/2014    Priority: High   B12 deficiency 05/17/2020    Priority: Medium   Iliac artery aneurysm ([place]) 05/17/2020    Priority: Medium   Osteoporosis 04/04/2020    Priority: Medium   Chronic kidney disease (CKD), stage III (moderate) ([place]) 01/19/2020    Priority: Medium   GERD (gastroesophageal reflux disease) 12/22/2019    Priority: Medium   Essential tremor 12/22/2019    Priority: Medium   Hyperlipidemia 11/11/2017    Priority: Medium   Right ureteral stone 11/05/2017    Priority: Medium   History of prostate cancer 03/08/2017    Priority: Medium   Hx of chondrosarcoma 02/28/2017    Priority: Medium   Peripheral edema 06/08/2012    Priority: Medium   Extrinsic asthma 04/07/2009    Priority: Medium    LOW BACK PAIN 04/21/2008    Priority: Medium   History of peptic ulcer disease 04/21/2008    Priority: Medium   Primary hypertension 10/29/2007    Priority: Medium   Chronic anticoagulation 03/26/2021    Priority: Low   Acquired coagulation disorder ([place]) 12/12/2020    Priority: Low   Senile purpura ([place]) 12/12/2020    Priority: Low   Femoral condyle fracture ([place]) 07/15/2019    Priority: Low   Community acquired pneumonia of right upper lobe of lung 11/05/2017    Priority: Low   History of total right hip replacement 03/08/2017    Priority: Low   DDD (degenerative disc disease), thoracic 02/28/2017    Priority: Low   Lung nodule, solitary 05/24/2014    Priority: Low   Other fatigue 06/05/2021   SOB (shortness of breath) on exertion 03/26/2021   Anemia in chronic kidney disease 01/25/2021   Thin skin 07/25/2020   Gross hematuria 06/14/2019    Medications- reviewed and updated Current Outpatient Medications  Medication Sig Dispense Refill   acetaminophen (TYLENOL) 325 MG tablet Take 2 tablets (650 mg total) by mouth every 6 (six) hours as needed for mild pain or moderate pain.     apixaban (ELIQUIS) 2.5 MG TABS tablet Take 1 tablet (2.5 mg total) by mouth 2 (two) times daily.     atorvastatin (LIPITOR) 20 MG tablet  Take 1 tablet (20 mg total) by mouth at bedtime. 90 tablet 3   carvedilol (COREG) 25 MG tablet Take 25 mg by mouth 2 (two) times daily.     Cholecalciferol (VITAMIN D3) 50 MCG (2000 UT) capsule Take 2,000 Units by mouth in the morning and at bedtime.      indapamide (LOZOL) 1.25 MG tablet Take 1.25 mg by mouth daily.     metoCLOPramide (REGLAN) 5 MG tablet TAKE 1 TABLET (5 MG TOTAL) BY MOUTH 3 (THREE) TIMES DAILY BEFORE MEALS. 90 tablet 1   No current facility-administered medications for this visit.     Objective:  BP 115/67   Pulse 64   Temp 98 F (36.7 C) (Temporal)   Ht '5\' 11"'$  (1.803 m)   Wt 232 lb 9.6 oz (105.5 kg)   SpO2 98%   BMI 32.44 kg/m   Gen: NAD, resting comfortably Sinus pressure noted . Oropharyn normal CV: RRR no murmurs rubs or gallops Lungs: CTAB no crackles, wheeze, rhonchi Ext: no edema Skin: warm, dry     Assessment and Plan  # Sinus Issues S: -Patient had a video visit with Colin Benton, PO on 07/24/2021. His symptoms included nasal congestion, watery eyes, itchy eyes and some sneezing. Denied any shortness of breath, chest pains, fever or inability to eat/drink/get out of bed. Tried Advil.  Concern at that visit was for allergic rhinitis versus upper respiratory infection versus COVID-19 (home tests were negative)  Plan was for nasal saline, antihistamine, potential intranasal steroid with plans to follow-up with PCP if not improving  He states nasal congestion is ongoing. On head CT they even noted paranasal sinus thickening. Having sinus pressure. A/P: 75 year old gentleman with history of sinus pressure/congestion for over a month now-did not resolve with treatment for allergies and on recent CT was noted to have paranasal sinus thickening concerning for bacterial sinusitis-we sent in Augmentin for him to take at least 7 days if has full relief-if not can take full 10 days.  If he has new or worsening or recurrent symptoms he should let us know  #Fall/head trauma on Eliquis-unfortunately patient had a fall on 08/22/2021-was seen by physical therapy who mentioned fall with head trauma to me-due to need to get neuroimaging encourage patient to go to the [facility].  Also had x-ray of her scapula which had extensive bruising and no fracture was noted cy room-CT of head and neck was stable without acute bleed or fracture. Headache now gone thankfully. Did have some arthritis in neck on CT.   #obesity/exercise-Down 4 pounds with healthy weight to wellness with last visit July 20.  Still doing barre classes at times, working on eating healthier   #Cologuard-previously planned but will be seeing GI clinic with the [facility] today      #Low back pain increase/gait imbalance-he stated back pain had flared up since being off of prednisone- wondered if PT may help. We placed a referral 07/22. He continued to walk with walker. We hoped maybe doing some core exercises would help with back pain -also on exam appeared unstable with standing- gait and balance training could be helpful as well -Patient stated he may have to restart his prednisone due to level of pain   # Anemia- was told by [facility] doctor this was continued to drop. Plan was GI referral if continued to drop. They are going to get me a copy of these results- never got cologuard done so this would be very reasonable.  - tough balance  here with history of pulmonary embolism thought provoked by sedentary activity- ideally would stop eliquis with worsened anemia but we may need hematology opinion Dr. Alvy Bimler and potentially even a bridge for colonoscopy if needed due to risk of recurrence (I think that is less likely that he will need this)  #Diastolic CHF S: Patient with aggressive diuresis in the hospital and was down at least 17 pounds. He was on indapamide  and cardiology did not recommend use of Lasix regularly. His family requested having Lasix available if he were to have weight gain or increased swelling in prior visits- had not needed lately- We opted to prescribe and only use if he had weight gain 3 lbs in a day or 5 lbs within a week or if had significant edema or SOB. Patient did agree to weigh daily - has not needed lasix thankfully - has seen nutritionist and found helpful- now working with healthy weight to wellness A/P: stable/euvolemic- continue current meds  #Chronic kidney disease stage III- S:improvement on last labs with Cr down to 1.42 from 1.7- checked a month ago and stbale at 1.5.  glad he had been able to avoid lasix- has not used at all since last viit A/P: CKD III stable- continue to monitor. He knws to avoid nsaids. Tylenol only    #hyperlipidemia #coronary artery calcium score of 152 so LDL goal under 70- at goal #aortic atherosclerosis S: Medication: Atorvastatin 20 mg daily, since on eliquis will remain off aspirin Lab Results  Component Value Date   CHOL 101 07/11/2021   HDL 34 (A) 07/11/2021   LDLCALC 50 07/11/2021   LDLDIRECT 145.4 06/27/2011   TRIG 84 07/11/2021   CHOLHDL 3 04/03/2021   A/P: Hyperlipidemia at goal with LDL under 70-this is at target both for coronary artery calcium as well as aortic atherosclerosis.  Continue current medications.  Remain off aspirin as he is on Eliquis.  #Chronic DVT/PE- continued chronic Eliquis 2.5 mg twice daily as recommended by hematology/oncology-[facility]Dr. Alvy Bimler recommended ongoing therapy on 07/20/21 due to risk from sedentary activity Stable. Continue current medications.   #hypertension S: medication: lozol 1.25 mg daily and coreg 25 mg twice daily, lasix as needed Home readings #s: 130s/70s BP Readings from Last 3 Encounters:  08/28/21 115/67  08/23/21 (!) 143/72  07/20/21 128/61  A/P: Stable. Continue current medications.   #bilateral hand pain- if sits without moving for a while will lock up some since stopping prednisone- worsening over time. Voltaren gel helps some on the hands- tried once a day. Worse with icing. Does well with heat.  -Has trigger finger on left fourth finger-we discussed double Band-Aid splint - Likely has arthritis in his hands-discussed using Voltaren gel 4 times a day for 5 days to try to calm this down - If not improving on either he can contact us and we will refer to sports medicine or orthopedics per his preference   Recommended follow up: 3 month follow up Future Appointments  Date Time Provider [place]  01/17/2022  3:00 PM Sueanne Margarita, MD [id number]    Lab/Order associations:   ICD-10-CM   1. Hyperlipidemia, unspecified hyperlipidemia type   E78.5     2. Stage 3 chronic kidney disease, unspecified whether stage 3a or 3b CKD (HCC)  N18.30     3. Chronic diastolic congestive heart failure (HCC)  I50.32     4. Coronary artery disease involving native coronary artery of native heart without  angina pectoris  I25.10     5. Aortic atherosclerosis (HCC)  I70.0     6. Obesity with current BMI 31.6  E66.9    Z68.32     7. Anemia in stage 3 chronic kidney disease, unspecified whether stage 3a or 3b CKD (HCC)  N18.30    D63.1     8. Other abnormalities of gait and mobility  R26.89     9. Gastroesophageal reflux disease, unspecified whether esophagitis present  K21.9     10. Essential hypertension  I10       No orders of the defined types were placed in this encounter.   I,Jada Bradford,acting as a scribe for [name], MD.,have documented all relevant documentation on the behalf of [name], MD,as directed by  [name], MD while in the presence of [name], MD.  I, [name], MD, have reviewed all documentation for this visit. The documentation on 08/28/21 for the exam, diagnosis, procedures, and orders are all accurate and complete.  Return precautions advised.  [name], MD

## 2021-08-28 ENCOUNTER — Ambulatory Visit (INDEPENDENT_AMBULATORY_CARE_PROVIDER_SITE_OTHER): Payer: Medicare Other | Admitting: Family Medicine

## 2021-08-28 ENCOUNTER — Encounter: Payer: Self-pay | Admitting: Family Medicine

## 2021-08-28 ENCOUNTER — Other Ambulatory Visit: Payer: Self-pay

## 2021-08-28 VITALS — BP 115/67 | HR 64 | Temp 98.0°F | Ht 71.0 in | Wt 232.6 lb

## 2021-08-28 DIAGNOSIS — J329 Chronic sinusitis, unspecified: Secondary | ICD-10-CM | POA: Diagnosis not present

## 2021-08-28 DIAGNOSIS — I10 Essential (primary) hypertension: Secondary | ICD-10-CM

## 2021-08-28 DIAGNOSIS — E785 Hyperlipidemia, unspecified: Secondary | ICD-10-CM

## 2021-08-28 DIAGNOSIS — I5032 Chronic diastolic (congestive) heart failure: Secondary | ICD-10-CM

## 2021-08-28 DIAGNOSIS — R2689 Other abnormalities of gait and mobility: Secondary | ICD-10-CM

## 2021-08-28 DIAGNOSIS — K219 Gastro-esophageal reflux disease without esophagitis: Secondary | ICD-10-CM

## 2021-08-28 DIAGNOSIS — B9689 Other specified bacterial agents as the cause of diseases classified elsewhere: Secondary | ICD-10-CM

## 2021-08-28 DIAGNOSIS — N183 Chronic kidney disease, stage 3 unspecified: Secondary | ICD-10-CM | POA: Diagnosis not present

## 2021-08-28 DIAGNOSIS — E669 Obesity, unspecified: Secondary | ICD-10-CM | POA: Diagnosis not present

## 2021-08-28 DIAGNOSIS — I700 Atherosclerosis of aorta: Secondary | ICD-10-CM | POA: Diagnosis not present

## 2021-08-28 DIAGNOSIS — I2510 Atherosclerotic heart disease of native coronary artery without angina pectoris: Secondary | ICD-10-CM

## 2021-08-28 DIAGNOSIS — Z6832 Body mass index (BMI) 32.0-32.9, adult: Secondary | ICD-10-CM

## 2021-08-28 DIAGNOSIS — D631 Anemia in chronic kidney disease: Secondary | ICD-10-CM

## 2021-08-28 MED ORDER — AMOXICILLIN-POT CLAVULANATE 875-125 MG PO TABS: 1.0000 | ORAL_TABLET | Freq: Two times a day (BID) | ORAL | 0 refills | Status: DC

## 2021-08-28 NOTE — Patient Instructions (Addendum)
Health Maintenance Due  Topic Date Due   Fecal DNA (Cologuard) Patient will get this done at the [facility] and have them send it to [name] or get a print out.  Have GI fax/forward [name] a copy. Never done   PNA vac Low Risk Adult (2 of 2 - PPSV23) Patient will have this done at the [facility] and have them send it to [name] or get a print out.  05/20/2015   Zoster Vaccines- Shingrix (2 of 2) Patient will have this done at the [facility] and have them send it to [name] or get a print out.  01/23/2021   INFLUENZA VACCINE Patient will get this done at the [facility] and have them send it to [name] or get a print out.  07/30/2021   Start Augmentin for 10 days. If symptoms are completely resolved by day 7 you may stop - if not you may complete the full 10-day course. If you have any new or worsening recurrent symptoms, please let us know.  If your weight goes up, you may use Lasix.  For your trigger finger, I recommend using Voltaren gel 4 times a day for 5 days .You could also try a using the double band-aid splint method we discussed today in your visit or you could purchase a hand brace/splint. If hand pain is not improving please let me know so I can place a referral to Sports Medicine or an orthopedist of your choice.  Recommended follow up: Return in about 3 months (around 11/28/2021) for a follow-up or sooner if needed.

## 2021-09-07 ENCOUNTER — Telehealth: Payer: Self-pay

## 2021-09-07 DIAGNOSIS — Z20822 Contact with and (suspected) exposure to covid-19: Secondary | ICD-10-CM | POA: Diagnosis not present

## 2021-09-07 MED ORDER — AMOXICILLIN-POT CLAVULANATE 875-125 MG PO TABS: 1.0000 | ORAL_TABLET | Freq: Two times a day (BID) | ORAL | 0 refills | Status: DC

## 2021-09-07 NOTE — Telephone Encounter (Signed)
Patient was seen 8/30 for sinus infection and has taken full course of medication and has not improved can we send either another round of medication in for him or send a different antibiotic for him or does he need to come back in to be revaluated. Please advise patient

## 2021-09-07 NOTE — Telephone Encounter (Signed)
Refill sent.

## 2021-09-07 NOTE — Telephone Encounter (Signed)
Can you advise in Dr. Ansel Bong absence?

## 2021-09-07 NOTE — Telephone Encounter (Signed)
Ok to send in one more round of antibiotics but needs to follow up if still not improving.  Bruce Ball. Jerline Pain, MD 09/07/2021 2:11 PM

## 2021-09-10 ENCOUNTER — Telehealth: Payer: Self-pay | Admitting: Pharmacist

## 2021-09-10 NOTE — Chronic Care Management (AMB) (Addendum)
Chronic Care Management Pharmacy Assistant   Name: MARCELL HOCEVAR  MRN: [id number] DOB: [birth year]   Reason for Encounter: Disease State call for HTN    Recent office visits:  08/28/21 [name] MD Started on Amoxicillin-Pot Clavulanate 875-125 mg 1 tablet 2 times daily for 10 days. 08/28/21 [facility] No med changes. No follow ups 09/07/21 Telephone Encounter. Restarted Amoxicillin-Pot Clavulanate 875-125 mg 1 tablet 2 times daily for 10 days.    Recent consult visits:  None since 07/26/21  Hospital visits:  [id number] Campbell Stall DO [facility] Pain of right Scapula. No med changes. No follow up .  Medications: Outpatient Encounter Medications as of 09/10/2021  Medication Sig   acetaminophen (TYLENOL) 325 MG tablet Take 2 tablets (650 mg total) by mouth every 6 (six) hours as needed for mild pain or moderate pain.   amoxicillin-clavulanate (AUGMENTIN) 875-125 MG tablet Take 1 tablet by mouth 2 (two) times daily.   apixaban (ELIQUIS) 2.5 MG TABS tablet Take 1 tablet (2.5 mg total) by mouth 2 (two) times daily.   atorvastatin (LIPITOR) 20 MG tablet Take 1 tablet (20 mg total) by mouth at bedtime.   carvedilol (COREG) 25 MG tablet Take 25 mg by mouth 2 (two) times daily.   Cholecalciferol (VITAMIN D3) 50 MCG (2000 UT) capsule Take 2,000 Units by mouth in the morning and at bedtime.    indapamide (LOZOL) 1.25 MG tablet Take 1.25 mg by mouth daily.   metoCLOPramide (REGLAN) 5 MG tablet TAKE 1 TABLET (5 MG TOTAL) BY MOUTH 3 (THREE) TIMES DAILY BEFORE MEALS.   No facility-administered encounter medications on file as of 09/10/2021.   Reviewed chart prior to disease state call. Spoke with patient regarding BP  Recent Office Vitals: BP Readings from Last 3 Encounters:  08/28/21 115/67  08/23/21 (!) 143/72  07/20/21 128/61   Pulse Readings from Last 3 Encounters:  08/28/21 64  08/23/21 65  07/20/21 (!) 58    Wt Readings from Last 3 Encounters:   08/28/21 232 lb 9.6 oz (105.5 kg)  08/23/21 230 lb (104.3 kg)  07/24/21 228 lb (103.4 kg)     Kidney Function Lab Results  Component Value Date/Time   CREATININE 1.5 (A) 07/11/2021 12:00 AM   CREATININE 1.42 (H) 06/05/2021 09:35 AM   CREATININE 1.7 (A) 05/11/2021 12:00 AM   CREATININE 1.67 (H) 04/03/2021 04:37 PM   CREATININE 1.67 (H) 01/25/2021 11:25 AM   CREATININE 1.42 (H) 07/25/2020 11:54 AM   GFR 39.99 (L) 04/03/2021 04:37 PM   GFRNONAA 47 (L) 03/29/2021 04:47 AM   GFRNONAA 43 (L) 01/25/2021 11:25 AM   GFRAA 56 (L) 07/25/2020 11:54 AM    BMP Latest Ref Rng & Units 07/11/2021 06/05/2021 05/11/2021  Glucose 65 - 99 mg/dL - 98 -  BUN 8 - 27 mg/dL - 22 22(A)  Creatinine 0.6 - 1.3 1.5(A) 1.42(H) 1.7(A)  BUN/Creat Ratio 10 - 24 - 15 -  Sodium 137 - 147 141 149(H) 142  Potassium 3.4 - 5.3 4.2 4.6 4.5  Chloride 99 - 108 110(A) 110(H) 104  CO2 13 - 22 29(A) 22 24(A)  Calcium 8.7 - 10.7 8.6(A) 8.9 9.2    Current antihypertensive regimen:  Carvedilol 25 mg 1 tablet twice daily  How often are you checking your Blood Pressure? weekly  Current home BP readings: Pt states he has been sick for a month and has not been keeping up with his BP lately  What recent interventions/DTPs have  been made by any provider to improve Blood Pressure control since last CPP Visit: Pt states no changes have been made  Any recent hospitalizations or ED visits since last visit with CPP? Yes  What diet changes have been made to improve Blood Pressure Control?  No changes recently What exercise is being done to improve your Blood Pressure Control?  Pt is currently not exercising due to not feeling well    Adherence Review: Is the patient currently on ACE/ARB medication? Yes Does the patient have >5 day gap between last estimated fill dates? No  Care Gaps: Medicare Annual Wellness: Done on 11/29/19 Hemoglobin A1C: 06/05/21 (5.5) Colonoscopy: Not available  Dexa Scan: Not available    Star  Rating Drugs: Atorvastatin 20 mg last fill on- Not available   Future Appointments  Date Time Provider [place]  01/17/2022  3:00 PM Sueanne Margarita, MD [facility]     [facility] [place]   10 minutes spent in review, coordination, and documentation.  Reviewed by: Beverly Milch, PharmD Clinical Pharmacist [phone]

## 2021-09-12 ENCOUNTER — Telehealth: Payer: Self-pay

## 2021-09-12 DIAGNOSIS — M79643 Pain in unspecified hand: Secondary | ICD-10-CM

## 2021-09-12 NOTE — Telephone Encounter (Signed)
Referral has been placed. 

## 2021-09-12 NOTE — Telephone Encounter (Signed)
Patient is requesting a referral to [facility] rheumatology for his hands  He states he is not improving  Please advise

## 2021-09-18 ENCOUNTER — Ambulatory Visit (INDEPENDENT_AMBULATORY_CARE_PROVIDER_SITE_OTHER): Payer: Medicare Other | Admitting: Physician Assistant

## 2021-09-18 ENCOUNTER — Other Ambulatory Visit: Payer: Self-pay

## 2021-09-18 ENCOUNTER — Encounter: Payer: Self-pay | Admitting: Physician Assistant

## 2021-09-18 VITALS — BP 126/70 | HR 63 | Temp 98.6°F | Ht 71.0 in | Wt 231.4 lb

## 2021-09-18 DIAGNOSIS — B9689 Other specified bacterial agents as the cause of diseases classified elsewhere: Secondary | ICD-10-CM | POA: Diagnosis not present

## 2021-09-18 DIAGNOSIS — J329 Chronic sinusitis, unspecified: Secondary | ICD-10-CM

## 2021-09-18 DIAGNOSIS — M450 Ankylosing spondylitis of multiple sites in spine: Secondary | ICD-10-CM

## 2021-09-18 MED ORDER — DOXYCYCLINE HYCLATE 100 MG PO TABS: 100.0000 mg | ORAL_TABLET | Freq: Two times a day (BID) | ORAL | 0 refills | Status: AC

## 2021-09-18 NOTE — Progress Notes (Signed)
Bruce Ball is a 75 y.o. male here for a new problem.  I acted as a Education administrator for [employer], PA-C Anselmo Pickler, LPN   History of Present Illness:   Chief Complaint  Patient presents with  . Sinus Problem    HPI  Sinus problem Pt c/o sinus issues x 2 months. He is having sinus pressure, nasal congestion with yellow drainage in morning otherwise clear. Having chills but no fever. He is very tired. Pt has completed two rounds of antibiotics Augmentin in the past month without significant relief of symptoms.  Head CT on 08/23/21 showed:  Sinuses/Orbits: There is mucosal thickening throughout the frontal sinuses, ethmoid air cells, left and right sphenoid sinus that is new from prior exam. No evidence of fracture or sinus fluid level. Bilateral cataract resection no mastoid effusion.  Ankylosing spondylitis Referral was placed for patient to see [facility] and has appointment with Dr Amil Amen in January. Wife is requesting referral to alternative office so patient can be seen sooner. She is requesting a provider within [facility] if possible.  Past Medical History:  Diagnosis Date  . Ankylosing spondylitis ([place])   . Arthritis of right knee   . Basal cell carcinoma   . Chronic back pain greater than 3 months duration   . Chronic leg pain   . CKD (chronic kidney disease), stage II   . Coronary artery calcification seen on CAT scan 03/27/2021  . Deafness in right ear   . Dyspnea    cause by PE  . Essential tremor   . Extrinsic asthma, unspecified 04/07/2009   PT DENIES  . GERD (gastroesophageal reflux disease)   . Hearing loss   . Hip problem   . History of kidney stones   . HYPERTENSION 10/29/2007  . HYPOTENSION 08/13/2010  . LOW BACK PAIN 04/21/2008  . Mild aortic stenosis   . NEPHROLITHIASIS, HX OF 04/21/2008  . Neuropathy   . Osteopenia   . Other hyperlipidemia   . Peptic ulcer   . Pneumonia   . PUD, HX OF 04/21/2008  . Pulmonary emboli ([place]) 2021   noted  on CT 12-2019  . Renal disorder   . Stage 3 chronic kidney disease ([place])   . Thyroid nodule      Social History   Tobacco Use  . Smoking status: Former    Packs/day: 1.00    Years: 17.00    Pack years: 17.00    Types: Cigarettes    Start date: 1963    Quit date: 12/30/1978    Years since quitting: 42.7  . Smokeless tobacco: Never  Vaping Use  . Vaping Use: Never used  Substance Use Topics  . Alcohol use: No  . Drug use: No    Past Surgical History:  Procedure Laterality Date  . BACK SURGERY    . bone cancer  1970   rt femur removed and steel rod placed  . CYSTOSCOPY WITH RETROGRADE PYELOGRAM, URETEROSCOPY AND STENT PLACEMENT Right 11/06/2017   Procedure: CYSTOSCOPY WITH  STENT PLACEMENT RIGHT;  Surgeon: Raynelle Bring, MD;  Location: [facility];  Service: Urology;  Laterality: Right;  . CYSTOSCOPY WITH RETROGRADE PYELOGRAM, URETEROSCOPY AND STENT PLACEMENT Left 06/09/2019   Procedure: CYSTOSCOPY WITH RETROGRADE PYELOGRAM, URETEROSCOPY AND STENT PLACEMENT X2;  Surgeon: [name], MD;  Location: [facility];  Service: Urology;  Laterality: Left;  . CYSTOSCOPY WITH RETROGRADE PYELOGRAM, URETEROSCOPY AND STENT PLACEMENT Bilateral 03/01/2020   Procedure: CYSTOSCOPY WITH RETROGRADE PYELOGRAM, URETEROSCOPY AND STENT PLACEMENT;  Surgeon: [name], MD;  Location: [facility];  Service: Urology;  Laterality: Bilateral;  75 MINS  . CYSTOSCOPY/RETROGRADE/URETEROSCOPY     1 year ago at [place] in [place]  . CYSTOSCOPY/URETEROSCOPY/HOLMIUM LASER Right 11/24/2017   Procedure: CYSTOSCOPY/URETEROSCOPY/ RETROGRADE/STENT REMOVAL;  Surgeon: Raynelle Bring, MD;  Location: [facility];  Service: Urology;  Laterality: Right;  . HOLMIUM LASER APPLICATION Bilateral 06/07/6788   Procedure: HOLMIUM LASER APPLICATION;  Surgeon: [name], MD;  Location: [facility];  Service: Urology;  Laterality: Bilateral;  . prosatectomy      Family History  Problem Relation Age of Onset  . COPD Father   . Hypertension  Father   . Cancer Other        prostate  . Prostate cancer Brother   . Esophageal cancer Neg Hx   . Stomach cancer Neg Hx   . Pancreatic cancer Neg Hx   . Colon cancer Neg Hx     No Known Allergies  Current Medications:   Current Outpatient Medications:  .  acetaminophen (TYLENOL) 325 MG tablet, Take 2 tablets (650 mg total) by mouth every 6 (six) hours as needed for mild pain or moderate pain., Disp: , Rfl:  .  apixaban (ELIQUIS) 2.5 MG TABS tablet, Take 1 tablet (2.5 mg total) by mouth 2 (two) times daily., Disp: , Rfl:  .  atorvastatin (LIPITOR) 20 MG tablet, Take 1 tablet (20 mg total) by mouth at bedtime., Disp: 90 tablet, Rfl: 3 .  carvedilol (COREG) 25 MG tablet, Take 25 mg by mouth 2 (two) times daily., Disp: , Rfl:  .  Cholecalciferol (VITAMIN D3) 50 MCG (2000 UT) capsule, Take 2,000 Units by mouth in the morning and at bedtime. , Disp: , Rfl:  .  ferrous sulfate 325 (65 FE) MG tablet, TAKE ONE TABLET BY MOUTH MONDAY, WEDNESDAY AND FRIDAY, Disp: , Rfl:  .  indapamide (LOZOL) 1.25 MG tablet, Take 1.25 mg by mouth daily., Disp: , Rfl:  .  metoCLOPramide (REGLAN) 5 MG tablet, TAKE 1 TABLET (5 MG TOTAL) BY MOUTH 3 (THREE) TIMES DAILY BEFORE MEALS., Disp: 90 tablet, Rfl: 1   Review of Systems:   ROS Negative unless otherwise specified per HPI.  Vitals:   Vitals:   09/18/21 1138  BP: 126/70  Pulse: 63  Temp: 98.6 F (37 C)  TempSrc: Temporal  SpO2: 96%  Weight: 231 lb 6.1 oz (105 kg)  Height: 5\' 11"  (1.803 m)     Body mass index is 32.27 kg/m.  Physical Exam:   Physical Exam Vitals and nursing note reviewed.  Constitutional:      General: He is not in acute distress.    Appearance: He is well-developed. He is not ill-appearing or toxic-appearing.  HENT:     Head: Normocephalic and atraumatic.     Right Ear: Tympanic membrane, ear canal and external ear normal. Tympanic membrane is not erythematous, retracted or bulging.     Left Ear: Tympanic membrane, ear  canal and external ear normal. Tympanic membrane is not erythematous, retracted or bulging.     Nose:     Right Sinus: Maxillary sinus tenderness and frontal sinus tenderness present.     Left Sinus: Maxillary sinus tenderness and frontal sinus tenderness present.     Mouth/Throat:     Pharynx: Uvula midline. No oropharyngeal exudate or posterior oropharyngeal erythema.  Cardiovascular:     Rate and Rhythm: Normal rate and regular rhythm.     Pulses: Normal pulses.     Heart sounds:  Normal heart sounds, S1 normal and S2 normal.     Comments: No LE edema Pulmonary:     Effort: Pulmonary effort is normal. No accessory muscle usage or respiratory distress.     Breath sounds: Normal breath sounds. No decreased breath sounds, wheezing, rhonchi or rales.  Lymphadenopathy:     Cervical: No cervical adenopathy.  Skin:    General: Skin is warm and dry.  Neurological:     Mental Status: He is alert.     GCS: GCS eye subscore is 4. GCS verbal subscore is 5. GCS motor subscore is 6.  Psychiatric:        Speech: Speech normal.        Behavior: Behavior normal. Behavior is cooperative.    Assessment and Plan:   Bacterial sinusitis Ongoing Will trial doxycycline as alternative abx Would like to avoid fluoroquinolones given joint/pain issues Referral to ENT placed today Worsening precautions advised   Ankylosing spondylitis of multiple sites in spine [facility]) Referral resubmitted per patient and wife's request  CMA or LPN served as scribe during this visit. History, Physical, and Plan performed by medical provider. The above documentation has been reviewed and is accurate and complete.   Inda Coke, PA-C

## 2021-09-18 NOTE — Patient Instructions (Signed)
It was great to see you!  Start oral doxycyline antibiotic  Referral to ENT placed today  If a referral was placed today, you will be contacted for an appointment. Please note that routine referrals can sometimes take up to 3-4 weeks to process. Please call our office if you haven't heard anything after this time frame.  Take care,  Inda Coke PA-C

## 2021-09-26 ENCOUNTER — Encounter: Payer: Self-pay | Admitting: Hematology and Oncology

## 2021-09-28 DIAGNOSIS — Z23 Encounter for immunization: Secondary | ICD-10-CM | POA: Diagnosis not present

## 2021-09-28 NOTE — Progress Notes (Signed)
Office Visit Note  Patient: Bruce Ball             Date of Birth: [birth year]           MRN: [id number]             PCP: [name], MD Referring: Inda Coke, PA Visit Date: 10/01/2021 Occupation: _0 @  Subjective:  Pain and swelling in both hands and lower back.   History of Present Illness: Bruce Ball is a 75 y.o. male returns today after his last visit in March 2017.  He was initially evaluated by me in October 2014.  He had history of degenerative disc disease and chronic pain.  The x-rays of his spine showed syndesmophytes.  He was HLA-B27 negative.  He had no response to Enbrel and Humira.  He was on prednisone 5 mg p.o. daily for a long time.  We discussed Cosentyx at 1 time which she declined.  He had been followed at [facility] where he was under care of a rheumatologist for the last several years.  He started tapering prednisone in January 2022 and completely came off prednisone in May 2022.  He states he started having increased pain and swelling in his hands since then.  He also gives history of discomfort in his bilateral hips and his lower back.  None of the other joints are painful or swollen.  Activities of Daily Living:  Patient reports morning stiffness for all day. Patient reports nocturnal pain.  Difficulty dressing/grooming: Reports Difficulty climbing stairs: Reports Difficulty getting out of chair: Reports Difficulty using hands for taps, buttons, cutlery, and/or writing: Reports  Review of Systems  Constitutional:  Positive for fatigue.  HENT:  Negative for mouth sores, mouth dryness and nose dryness.   Eyes:  Negative for pain, itching and dryness.  Respiratory:  Negative for shortness of breath and difficulty breathing.   Cardiovascular:  Negative for chest pain and palpitations.  Gastrointestinal:  Negative for blood in stool, constipation and diarrhea.  Endocrine: Negative for increased  urination.  Genitourinary:  Negative for difficulty urinating.  Musculoskeletal:  Positive for joint pain, joint pain, joint swelling, myalgias, morning stiffness, muscle tenderness and myalgias.  Skin:  Negative for color change, rash and redness.  Allergic/Immunologic: Negative for susceptible to infections.  Neurological:  Positive for dizziness, numbness and memory loss. Negative for headaches and weakness.  Hematological:  Positive for bruising/bleeding tendency.  Psychiatric/Behavioral:  Positive for confusion.    PMFS History:  Patient Active Problem List   Diagnosis Date Noted   Other fatigue 06/05/2021   CAD (coronary artery disease) 12/87/8676   Diastolic CHF ([place]) 72/08/4708   SOB (shortness of breath) on exertion 03/26/2021   Chronic anticoagulation 03/26/2021   Anemia in chronic kidney disease 01/25/2021   Acquired coagulation disorder ([place]) 12/12/2020   Senile purpura ([place]) 12/12/2020   Thin skin 07/25/2020   Aortic atherosclerosis ([place]) 05/17/2020   B12 deficiency 05/17/2020   Iliac artery aneurysm ([place]) 05/17/2020   Osteoporosis 04/04/2020   Idiopathic neuropathy 01/28/2020   Recurrent falls 01/28/2020   Chronic kidney disease (CKD), stage III (moderate) ([place]) 01/19/2020   Pulmonary emboli ([place]) 01/14/2020   GERD (gastroesophageal reflux disease) 12/22/2019   Essential tremor 12/22/2019   Femoral condyle fracture ([place]) 07/15/2019   Gross hematuria 06/14/2019   Hyperlipidemia 11/11/2017   Community acquired pneumonia of right upper lobe of lung 11/05/2017   Right ureteral stone 11/05/2017   History  of total right hip replacement 03/08/2017   History of prostate cancer 03/08/2017   Hx of chondrosarcoma 02/28/2017   High risk medication use 02/28/2017   DDD (degenerative disc disease), thoracic 02/28/2017   Ataxia 05/18/2016   Vestibular disequilibrium 05/18/2016   Ankylosing spondylitis ([place]) 05/24/2014   Lung nodule, solitary 05/24/2014   Peripheral edema  06/08/2012   Extrinsic asthma 04/07/2009   LOW BACK PAIN 04/21/2008   History of peptic ulcer disease 04/21/2008   Primary hypertension 10/29/2007    Past Medical History:  Diagnosis Date   Ankylosing spondylitis ([place])    Arthritis of right knee    Basal cell carcinoma    Chronic back pain greater than 3 months duration    Chronic leg pain    CKD (chronic kidney disease), stage II    Coronary artery calcification seen on CAT scan 03/27/2021   Deafness in right ear    Dyspnea    cause by PE   Essential tremor    Extrinsic asthma, unspecified 04/07/2009   PT DENIES   GERD (gastroesophageal reflux disease)    Hearing loss    Hip problem    History of kidney stones    HYPERTENSION 10/29/2007   HYPOTENSION 08/13/2010   LOW BACK PAIN 04/21/2008   Mild aortic stenosis    NEPHROLITHIASIS, HX OF 04/21/2008   Neuropathy    Osteopenia    Other hyperlipidemia    Peptic ulcer    Pneumonia    PUD, HX OF 04/21/2008   Pulmonary emboli ([place]) 2021   noted on CT 12-2019   Renal disorder    Stage 3 chronic kidney disease (HCC)    Thyroid nodule     Family History  Problem Relation Age of Onset   COPD Father    Hypertension Father    Cancer Other        prostate   Prostate cancer Brother    Esophageal cancer Neg Hx    Stomach cancer Neg Hx    Pancreatic cancer Neg Hx    Colon cancer Neg Hx    Past Surgical History:  Procedure Laterality Date   BACK SURGERY     bone cancer  1970   rt femur removed and steel rod placed   CYSTOSCOPY WITH RETROGRADE PYELOGRAM, URETEROSCOPY AND STENT PLACEMENT Right 11/06/2017   Procedure: CYSTOSCOPY WITH  STENT PLACEMENT RIGHT;  Surgeon: Raynelle Bring, MD;  Location: [facility];  Service: Urology;  Laterality: Right;   CYSTOSCOPY WITH RETROGRADE PYELOGRAM, URETEROSCOPY AND STENT PLACEMENT Left 06/09/2019   Procedure: CYSTOSCOPY WITH RETROGRADE PYELOGRAM, URETEROSCOPY AND STENT PLACEMENT X2;  Surgeon: [name], MD;  Location: [facility];  Service: Urology;   Laterality: Left;   CYSTOSCOPY WITH RETROGRADE PYELOGRAM, URETEROSCOPY AND STENT PLACEMENT Bilateral 03/01/2020   Procedure: CYSTOSCOPY WITH RETROGRADE PYELOGRAM, URETEROSCOPY AND STENT PLACEMENT;  Surgeon: [name], MD;  Location: [facility];  Service: Urology;  Laterality: Bilateral;  75 MINS   CYSTOSCOPY/RETROGRADE/URETEROSCOPY     1 year ago at [place] in [place] Right 11/24/2017   Procedure: CYSTOSCOPY/URETEROSCOPY/ RETROGRADE/STENT REMOVAL;  Surgeon: Raynelle Bring, MD;  Location: [facility];  Service: Urology;  Laterality: Right;   HOLMIUM LASER APPLICATION Bilateral 0/07/8109   Procedure: HOLMIUM LASER APPLICATION;  Surgeon: [name], MD;  Location: [facility];  Service: Urology;  Laterality: Bilateral;   prosatectomy     Social History   Social History Narrative   Not on file   Immunization History  Administered Date(s) Administered  Influenza Split 12/30/2013, 11/14/2015, 12/13/2015, 09/16/2018   Influenza, High Dose Seasonal PF 09/15/2012, 11/06/2016, 11/26/2017, 09/16/2018, 10/01/2019, 10/14/2019, 10/02/2020, 09/28/2021   Influenza-Unspecified 12/13/2015   PFIZER(Purple Top)SARS-COV-2 Vaccination 12/31/2019, 01/21/2020, 02/11/2020, 10/02/2020   Pfizer Covid-19 Vaccine Bivalent Booster 38yr & up 09/28/2021   Pneumococcal Conjugate-13 09/15/2012, 05/19/2014   Pneumococcal-Unspecified 09/15/2012, 05/19/2014   Td 03/23/2015   Tdap 08/30/2005, 03/23/2015, 02/14/2016   Zoster Recombinat (Shingrix) 11/28/2020   Zoster, Live 11/19/2013     Objective: Vital Signs: BP (!) 147/79 (BP Location: Right Arm, Patient Position: Sitting, Cuff Size: Normal)   Pulse (!) 59   Resp 18   Ht 5' 11.75" (1.822 m)   Wt 233 lb 6.4 oz (105.9 kg)   BMI 31.88 kg/m    Physical Exam Vitals and nursing note reviewed.  Constitutional:      Appearance: He is well-developed.  HENT:     Head: Normocephalic and atraumatic.  Eyes:     Conjunctiva/sclera:  Conjunctivae normal.     Pupils: Pupils are equal, round, and reactive to light.  Cardiovascular:     Rate and Rhythm: Normal rate and regular rhythm.     Heart sounds: Normal heart sounds.  Pulmonary:     Effort: Pulmonary effort is normal.     Breath sounds: Normal breath sounds.  Abdominal:     General: Bowel sounds are normal.     Palpations: Abdomen is soft.  Musculoskeletal:     Cervical back: Normal range of motion and neck supple.  Skin:    General: Skin is warm and dry.     Capillary Refill: Capillary refill takes less than 2 seconds.  Neurological:     Mental Status: He is alert and oriented to person, place, and time.  Psychiatric:        Behavior: Behavior normal.     Musculoskeletal Exam: He has some limitation with lateral rotation of his cervical spine.  Cervical flexion and extension was somewhat limited.  He had very limited range of motion of thoracic and lumbar spine.  There was some tenderness over right SI joint.  Shoulder joints, elbow joints, wrist joints with good range of motion.  No synovitis was noted over MCPs PIPs or DIPs.  Although he had tenderness on palpation of PIPs and DIPs and difficulty making a fist.  Hip joints with good range of motion.  Knee joints are in good range of motion without any warmth swelling or effusion.  There is no tenderness over ankles or MTPs.  CDAI Exam: CDAI Score: -- Patient Global: --; Provider Global: -- Swollen: --; Tender: -- Joint Exam 10/01/2021   No joint exam has been documented for this visit   There is currently no information documented on the homunculus. Go to the Rheumatology activity and complete the homunculus joint exam.  Investigation: No additional findings.  Imaging: [place] JOINT SPACE STRUCTURES UP BILAT  Result Date: 10/01/2021 Ultrasound examination of the bilateral hands was performed per EULAR recommendations. Using 15 MHz transducer, grayscale and power Doppler bilateral second MCPs,  second, third, and fifth PIP and DIP joints and bilateral wrist joints both dorsal and volar aspects were evaluated to look for synovitis or tenosynovitis. The findings were there was no synovitis or tenosynovitis on ultrasound examination. Right median nerve was 0.10 cm squares which was within normal limits and left median nerve was 0.10 cm squares which was within normal limits. Impression: Ultrasound examination did not show any synovitis or tenosynovitis.  Bilateral median nerves are within  normal limits.   Recent Labs: Lab Results  Component Value Date   WBC 8.0 07/11/2021   HGB 12.5 (A) 07/11/2021   PLT 237 07/11/2021   NA 141 07/11/2021   K 4.2 07/11/2021   CL 110 (A) 07/11/2021   CO2 29 (A) 07/11/2021   GLUCOSE 98 06/05/2021   BUN 22 06/05/2021   CREATININE 1.5 (A) 07/11/2021   BILITOT 0.4 06/05/2021   ALKPHOS 80 07/11/2021   AST 22 07/11/2021   ALT 25 07/11/2021   PROT 6.2 06/05/2021   ALBUMIN 3.1 (A) 07/11/2021   CALCIUM 8.6 (A) 07/11/2021   GFRAA 56 (L) 07/25/2020    Speciality Comments: No specialty comments available.  Procedures:  No procedures performed Allergies: Patient has no known allergies.   Assessment / Plan:     Visit Diagnoses: Bilateral hand pain -patient returns today after his last visit in 2017.  He states he has been having increased pain and discomfort in his hands since he has been off prednisone.  He had been on prednisone 5 mg p.o. daily for many years.  He had been off prednisone since May 2022.  He had difficulty making a fist and had tenderness on palpation of her PIP and DIP joints.  I did not notice any synovitis.  I decided to proceed with ultrasound to look for synovitis.  He was in agreement.  After informed consent was obtained ultrasound examination of bilateral hands was performed to look for synovitis and tenosynovitis.  Plan: [name] COMPLETE JOINT SPACE STRUCTURES UP BILAT-ultrasound of bilateral hands and wrist joints did not show any  synovitis or tenosynovitis.  Bilateral median nerves are within normal limits.  History of total right hip replacement - Due to chondrosarcoma of the right hip  Hx of chondrosarcoma  DDD (degenerative disc disease), thoracic he continues to have limited range of motion and discomfort in his thoracic spine.  I reviewed x-ray of his thoracic spine from November 16, 2019 which showed degenerative changes and unilateral syndesmophytes.  DDD (degenerative disc disease), lumbar-x-ray of the lumbar spine from November 16, 1999 2020 were reviewed which showed facet joint arthropathy no significant disc space narrowing and syndesmophytes between L1 and L2.  Patient had been under my care in the past between 2014 and 2017.  At that time we suspected ankylosing spondylitis due to severe pain in his thoracic and lumbar spine, syndesmophytes and limited mobility.  He was given a trial of Enbrel and Humira but he had no response to anti-TNF's.  He had been on prednisone 5 mg p.o. daily for many years which he finally came off.  I also reviewed his SI joint x-rays which did not show any joint space narrowing, erosive changes or sclerosis. High risk medication use - Inadequate response to Enbrel and Humira. Declined cosentyx at [facility] on 02/12/16-then lost f/u.  Due to ongoing pain and discomfort he requests a referral to pain management.  Other osteoporosis with current pathological fracture, sequela - The patient he was on Reclast infusions for the last 3 years.  The last infusion was February 2022.  He is followed at [facility].  Other medical problems are listed as follows  Primary hypertension  Chronic diastolic congestive heart failure ([place])  Pure hypercholesterolemia  Coronary artery disease involving native coronary artery of native heart without angina pectoris  Iliac artery aneurysm (HCC)  Other chronic pulmonary embolism with acute cor pulmonale ([place]) - 03/ 2021 treated with Eliquis  Chronic  anticoagulation  Lung nodule, solitary  Stage 3 chronic kidney disease, unspecified whether stage 3a or 3b CKD (HCC)  History of asthma  History of gastroesophageal reflux (GERD)  History of peptic ulcer disease  Vestibular disequilibrium involving both inner ears  Idiopathic neuropathy  Essential tremor  Peripheral edema  Other fatigue  History of prostate cancer - Age 28.  Status post prostatectomy  B12 deficiency  Senile purpura ([place])  Orders: Orders Placed This Encounter  Procedures   [name] COMPLETE JOINT SPACE STRUCTURES UP BILAT   Ambulatory referral to Physical Medicine Rehab    No orders of the defined types were placed in this encounter.   Face-to-face time spent with patient was 50 minutes. Greater than 50% of time was spent in counseling and coordination of care.  Follow-Up Instructions: Return if symptoms worsen or fail to improve, for DDD, Spondyloarthropathy.   Bo Merino, MD  Note - This record has been created using Editor, commissioning.  Chart creation errors have been sought, but may not always  have been located. Such creation errors do not reflect on  the standard of medical care.

## 2021-10-01 ENCOUNTER — Ambulatory Visit (INDEPENDENT_AMBULATORY_CARE_PROVIDER_SITE_OTHER): Payer: Medicare Other | Admitting: Rheumatology

## 2021-10-01 ENCOUNTER — Ambulatory Visit: Payer: Self-pay

## 2021-10-01 ENCOUNTER — Encounter: Payer: Self-pay | Admitting: Rheumatology

## 2021-10-01 ENCOUNTER — Other Ambulatory Visit: Payer: Self-pay

## 2021-10-01 VITALS — BP 147/79 | HR 59 | Resp 18 | Ht 71.75 in | Wt 233.4 lb

## 2021-10-01 DIAGNOSIS — E7800 Pure hypercholesterolemia, unspecified: Secondary | ICD-10-CM | POA: Diagnosis not present

## 2021-10-01 DIAGNOSIS — R600 Localized edema: Secondary | ICD-10-CM

## 2021-10-01 DIAGNOSIS — Z8546 Personal history of malignant neoplasm of prostate: Secondary | ICD-10-CM

## 2021-10-01 DIAGNOSIS — I5032 Chronic diastolic (congestive) heart failure: Secondary | ICD-10-CM | POA: Diagnosis not present

## 2021-10-01 DIAGNOSIS — I2510 Atherosclerotic heart disease of native coronary artery without angina pectoris: Secondary | ICD-10-CM | POA: Diagnosis not present

## 2021-10-01 DIAGNOSIS — M51369 Other intervertebral disc degeneration, lumbar region without mention of lumbar back pain or lower extremity pain: Secondary | ICD-10-CM

## 2021-10-01 DIAGNOSIS — I723 Aneurysm of iliac artery: Secondary | ICD-10-CM

## 2021-10-01 DIAGNOSIS — D692 Other nonthrombocytopenic purpura: Secondary | ICD-10-CM

## 2021-10-01 DIAGNOSIS — S72414D Nondisplaced unspecified condyle fracture of lower end of right femur, subsequent encounter for closed fracture with routine healing: Secondary | ICD-10-CM

## 2021-10-01 DIAGNOSIS — N183 Chronic kidney disease, stage 3 unspecified: Secondary | ICD-10-CM

## 2021-10-01 DIAGNOSIS — E538 Deficiency of other specified B group vitamins: Secondary | ICD-10-CM

## 2021-10-01 DIAGNOSIS — M8080XS Other osteoporosis with current pathological fracture, unspecified site, sequela: Secondary | ICD-10-CM

## 2021-10-01 DIAGNOSIS — Z7901 Long term (current) use of anticoagulants: Secondary | ICD-10-CM

## 2021-10-01 DIAGNOSIS — R609 Edema, unspecified: Secondary | ICD-10-CM

## 2021-10-01 DIAGNOSIS — Z8719 Personal history of other diseases of the digestive system: Secondary | ICD-10-CM

## 2021-10-01 DIAGNOSIS — M79641 Pain in right hand: Secondary | ICD-10-CM | POA: Diagnosis not present

## 2021-10-01 DIAGNOSIS — M8080XA Other osteoporosis with current pathological fracture, unspecified site, initial encounter for fracture: Secondary | ICD-10-CM

## 2021-10-01 DIAGNOSIS — I2609 Other pulmonary embolism with acute cor pulmonale: Secondary | ICD-10-CM

## 2021-10-01 DIAGNOSIS — Z79899 Other long term (current) drug therapy: Secondary | ICD-10-CM

## 2021-10-01 DIAGNOSIS — I2782 Chronic pulmonary embolism: Secondary | ICD-10-CM

## 2021-10-01 DIAGNOSIS — Z96641 Presence of right artificial hip joint: Secondary | ICD-10-CM | POA: Diagnosis not present

## 2021-10-01 DIAGNOSIS — Z85830 Personal history of malignant neoplasm of bone: Secondary | ICD-10-CM

## 2021-10-01 DIAGNOSIS — G609 Hereditary and idiopathic neuropathy, unspecified: Secondary | ICD-10-CM

## 2021-10-01 DIAGNOSIS — D689 Coagulation defect, unspecified: Secondary | ICD-10-CM

## 2021-10-01 DIAGNOSIS — M450 Ankylosing spondylitis of multiple sites in spine: Secondary | ICD-10-CM

## 2021-10-01 DIAGNOSIS — M5134 Other intervertebral disc degeneration, thoracic region: Secondary | ICD-10-CM | POA: Diagnosis not present

## 2021-10-01 DIAGNOSIS — R911 Solitary pulmonary nodule: Secondary | ICD-10-CM

## 2021-10-01 DIAGNOSIS — I10 Essential (primary) hypertension: Secondary | ICD-10-CM | POA: Diagnosis not present

## 2021-10-01 DIAGNOSIS — H832X3 Labyrinthine dysfunction, bilateral: Secondary | ICD-10-CM

## 2021-10-01 DIAGNOSIS — R5383 Other fatigue: Secondary | ICD-10-CM

## 2021-10-01 DIAGNOSIS — M79642 Pain in left hand: Secondary | ICD-10-CM

## 2021-10-01 DIAGNOSIS — M5136 Other intervertebral disc degeneration, lumbar region: Secondary | ICD-10-CM

## 2021-10-01 DIAGNOSIS — Z8709 Personal history of other diseases of the respiratory system: Secondary | ICD-10-CM

## 2021-10-01 DIAGNOSIS — Z8711 Personal history of peptic ulcer disease: Secondary | ICD-10-CM

## 2021-10-01 DIAGNOSIS — I700 Atherosclerosis of aorta: Secondary | ICD-10-CM

## 2021-10-01 DIAGNOSIS — G250 Essential tremor: Secondary | ICD-10-CM

## 2021-10-10 ENCOUNTER — Emergency Department (HOSPITAL_COMMUNITY): Payer: Medicare Other

## 2021-10-10 ENCOUNTER — Inpatient Hospital Stay (HOSPITAL_COMMUNITY)
Admission: EM | Admit: 2021-10-10 | Discharge: 2021-10-13 | DRG: 533 | Disposition: A | Discharge status: Other Acute Inpatient Hospital | Payer: Medicare Other | Attending: Student | Admitting: Student

## 2021-10-10 ENCOUNTER — Other Ambulatory Visit: Payer: Self-pay

## 2021-10-10 DIAGNOSIS — Z96649 Presence of unspecified artificial hip joint: Secondary | ICD-10-CM

## 2021-10-10 DIAGNOSIS — M459 Ankylosing spondylitis of unspecified sites in spine: Secondary | ICD-10-CM

## 2021-10-10 DIAGNOSIS — S72001A Fracture of unspecified part of neck of right femur, initial encounter for closed fracture: Secondary | ICD-10-CM | POA: Diagnosis present

## 2021-10-10 DIAGNOSIS — N183 Chronic kidney disease, stage 3 unspecified: Secondary | ICD-10-CM

## 2021-10-10 DIAGNOSIS — K219 Gastro-esophageal reflux disease without esophagitis: Secondary | ICD-10-CM

## 2021-10-10 DIAGNOSIS — S7292XA Unspecified fracture of left femur, initial encounter for closed fracture: Secondary | ICD-10-CM

## 2021-10-10 DIAGNOSIS — I5032 Chronic diastolic (congestive) heart failure: Secondary | ICD-10-CM

## 2021-10-10 DIAGNOSIS — I2510 Atherosclerotic heart disease of native coronary artery without angina pectoris: Secondary | ICD-10-CM

## 2021-10-10 DIAGNOSIS — W19XXXA Unspecified fall, initial encounter: Principal | ICD-10-CM

## 2021-10-10 DIAGNOSIS — I10 Essential (primary) hypertension: Secondary | ICD-10-CM

## 2021-10-10 DIAGNOSIS — M978XXA Periprosthetic fracture around other internal prosthetic joint, initial encounter: Secondary | ICD-10-CM

## 2021-10-10 DIAGNOSIS — Z86711 Personal history of pulmonary embolism: Secondary | ICD-10-CM

## 2021-10-10 DIAGNOSIS — R52 Pain, unspecified: Secondary | ICD-10-CM

## 2021-10-10 DIAGNOSIS — E785 Hyperlipidemia, unspecified: Secondary | ICD-10-CM

## 2021-10-10 DIAGNOSIS — M9702XA Periprosthetic fracture around internal prosthetic left hip joint, initial encounter: Secondary | ICD-10-CM | POA: Diagnosis not present

## 2021-10-10 DIAGNOSIS — M5417 Radiculopathy, lumbosacral region: Secondary | ICD-10-CM | POA: Diagnosis present

## 2021-10-10 DIAGNOSIS — K30 Functional dyspepsia: Secondary | ICD-10-CM | POA: Diagnosis present

## 2021-10-10 DIAGNOSIS — R296 Repeated falls: Secondary | ICD-10-CM | POA: Diagnosis present

## 2021-10-10 DIAGNOSIS — R262 Difficulty in walking, not elsewhere classified: Secondary | ICD-10-CM | POA: Diagnosis not present

## 2021-10-10 DIAGNOSIS — Y831 Surgical operation with implant of artificial internal device as the cause of abnormal reaction of the patient, or of later complication, without mention of misadventure at the time of the procedure: Secondary | ICD-10-CM | POA: Diagnosis present

## 2021-10-10 DIAGNOSIS — G9340 Encephalopathy, unspecified: Secondary | ICD-10-CM | POA: Diagnosis not present

## 2021-10-10 DIAGNOSIS — M810 Age-related osteoporosis without current pathological fracture: Secondary | ICD-10-CM | POA: Diagnosis not present

## 2021-10-10 DIAGNOSIS — E669 Obesity, unspecified: Secondary | ICD-10-CM | POA: Diagnosis present

## 2021-10-10 DIAGNOSIS — W010XXA Fall on same level from slipping, tripping and stumbling without subsequent striking against object, initial encounter: Secondary | ICD-10-CM | POA: Diagnosis present

## 2021-10-10 DIAGNOSIS — T50995A Adverse effect of other drugs, medicaments and biological substances, initial encounter: Secondary | ICD-10-CM | POA: Diagnosis present

## 2021-10-10 DIAGNOSIS — Z85830 Personal history of malignant neoplasm of bone: Secondary | ICD-10-CM

## 2021-10-10 DIAGNOSIS — Z981 Arthrodesis status: Secondary | ICD-10-CM

## 2021-10-10 DIAGNOSIS — G9349 Other encephalopathy: Secondary | ICD-10-CM | POA: Diagnosis present

## 2021-10-10 DIAGNOSIS — M9701XA Periprosthetic fracture around internal prosthetic right hip joint, initial encounter: Secondary | ICD-10-CM | POA: Diagnosis present

## 2021-10-10 DIAGNOSIS — R609 Edema, unspecified: Secondary | ICD-10-CM | POA: Diagnosis not present

## 2021-10-10 DIAGNOSIS — Z6833 Body mass index (BMI) 33.0-33.9, adult: Secondary | ICD-10-CM | POA: Diagnosis not present

## 2021-10-10 DIAGNOSIS — G928 Other toxic encephalopathy: Secondary | ICD-10-CM | POA: Diagnosis not present

## 2021-10-10 DIAGNOSIS — Z7901 Long term (current) use of anticoagulants: Secondary | ICD-10-CM

## 2021-10-10 DIAGNOSIS — R9431 Abnormal electrocardiogram [ECG] [EKG]: Secondary | ICD-10-CM | POA: Diagnosis not present

## 2021-10-10 DIAGNOSIS — R4182 Altered mental status, unspecified: Secondary | ICD-10-CM | POA: Diagnosis not present

## 2021-10-10 DIAGNOSIS — H9193 Unspecified hearing loss, bilateral: Secondary | ICD-10-CM | POA: Diagnosis present

## 2021-10-10 DIAGNOSIS — I130 Hypertensive heart and chronic kidney disease with heart failure and stage 1 through stage 4 chronic kidney disease, or unspecified chronic kidney disease: Secondary | ICD-10-CM | POA: Diagnosis present

## 2021-10-10 DIAGNOSIS — N1831 Chronic kidney disease, stage 3a: Secondary | ICD-10-CM | POA: Diagnosis present

## 2021-10-10 DIAGNOSIS — R102 Pelvic and perineal pain: Secondary | ICD-10-CM | POA: Diagnosis not present

## 2021-10-10 DIAGNOSIS — S72301A Unspecified fracture of shaft of right femur, initial encounter for closed fracture: Principal | ICD-10-CM | POA: Diagnosis present

## 2021-10-10 DIAGNOSIS — Z96641 Presence of right artificial hip joint: Secondary | ICD-10-CM | POA: Diagnosis not present

## 2021-10-10 DIAGNOSIS — Z20822 Contact with and (suspected) exposure to covid-19: Secondary | ICD-10-CM | POA: Diagnosis not present

## 2021-10-10 DIAGNOSIS — Z8546 Personal history of malignant neoplasm of prostate: Secondary | ICD-10-CM

## 2021-10-10 DIAGNOSIS — S72352A Displaced comminuted fracture of shaft of left femur, initial encounter for closed fracture: Secondary | ICD-10-CM | POA: Diagnosis not present

## 2021-10-10 DIAGNOSIS — S72402A Unspecified fracture of lower end of left femur, initial encounter for closed fracture: Secondary | ICD-10-CM | POA: Diagnosis not present

## 2021-10-10 HISTORY — DX: Presence of unspecified artificial hip joint: M97.8XXA

## 2021-10-10 LAB — RESP PANEL BY RT-PCR (FLU A&B, COVID) ARPGX2
Influenza A by PCR: NEGATIVE
Influenza B by PCR: NEGATIVE
SARS Coronavirus 2 by RT PCR: NEGATIVE

## 2021-10-10 LAB — CBC WITH DIFFERENTIAL/PLATELET
Abs Immature Granulocytes: 0.04 10*3/uL (ref 0.00–0.07)
Basophils Absolute: 0.1 10*3/uL (ref 0.0–0.1)
Basophils Relative: 1 %
Eosinophils Absolute: 0.3 10*3/uL (ref 0.0–0.5)
Eosinophils Relative: 5 %
HCT: 41.4 % (ref 39.0–52.0)
Hemoglobin: 13.3 g/dL (ref 13.0–17.0)
Immature Granulocytes: 1 %
Lymphocytes Relative: 23 %
Lymphs Abs: 1.5 10*3/uL (ref 0.7–4.0)
MCH: 25.7 pg — ABNORMAL LOW (ref 26.0–34.0)
MCHC: 32.1 g/dL (ref 30.0–36.0)
MCV: 80.1 fL (ref 80.0–100.0)
Monocytes Absolute: 0.5 10*3/uL (ref 0.1–1.0)
Monocytes Relative: 8 %
Neutro Abs: 4.2 10*3/uL (ref 1.7–7.7)
Neutrophils Relative %: 62 %
Platelets: 207 10*3/uL (ref 150–400)
RBC: 5.17 MIL/uL (ref 4.22–5.81)
RDW: 16.5 % — ABNORMAL HIGH (ref 11.5–15.5)
WBC: 6.7 10*3/uL (ref 4.0–10.5)
nRBC: 0 % (ref 0.0–0.2)

## 2021-10-10 LAB — BASIC METABOLIC PANEL
Anion gap: 8 (ref 5–15)
BUN: 16 mg/dL (ref 8–23)
CO2: 22 mmol/L (ref 22–32)
Calcium: 8.6 mg/dL — ABNORMAL LOW (ref 8.9–10.3)
Chloride: 107 mmol/L (ref 98–111)
Creatinine, Ser: 1.49 mg/dL — ABNORMAL HIGH (ref 0.61–1.24)
GFR, Estimated: 49 mL/min — ABNORMAL LOW (ref >60)
Glucose, Bld: 133 mg/dL — ABNORMAL HIGH (ref 70–99)
Potassium: 3.8 mmol/L (ref 3.5–5.1)
Sodium: 137 mmol/L (ref 135–145)

## 2021-10-10 MED ORDER — DIAZEPAM 5 MG/ML IJ SOLN
2.5000 mg | Freq: Once | INTRAMUSCULAR | Status: AC
  Administered 2021-10-10: 2.5 mg via INTRAVENOUS
  Filled 2021-10-10: qty 2

## 2021-10-10 MED ORDER — HYDROMORPHONE HCL 1 MG/ML IJ SOLN
1.0000 mg | INTRAMUSCULAR | Status: DC | PRN
  Administered 2021-10-10 – 2021-10-13 (×19): 1 mg via INTRAVENOUS
  Administered 2021-10-13: 0.5 mg via INTRAVENOUS
  Administered 2021-10-13 (×3): 1 mg via INTRAVENOUS
  Filled 2021-10-10 (×23): qty 1

## 2021-10-10 MED ORDER — HYDROMORPHONE HCL 1 MG/ML IJ SOLN
1.0000 mg | Freq: Once | INTRAMUSCULAR | Status: AC
  Administered 2021-10-10: 1 mg via INTRAVENOUS
  Filled 2021-10-10: qty 1

## 2021-10-10 MED ORDER — SODIUM CHLORIDE 0.9 % IV SOLN: INTRAVENOUS | Status: DC

## 2021-10-10 NOTE — ED Triage Notes (Signed)
Pt tripped over curb walking in garage. Fell on right side. Abrasion to right elbow. No LOC, did not hit head,no neck pain. On eliquis for DVT hx. Obvious deformity to right femur.

## 2021-10-10 NOTE — Assessment & Plan Note (Signed)
Stable

## 2021-10-10 NOTE — ED Provider Notes (Signed)
[facility] EMERGENCY DEPARTMENT Provider Note   CSN: [id number] Arrival date & time: 10/10/21  1353     History No chief complaint on file.   Bruce Ball is a 75 y.o. male.  Patient presents had mechanical fall just prior to arrival.  Struck his right hip and right thigh.  He is on Eliquis.  No symptoms prior to the fall.  Did not strike his head or neck.  Unable to ambulate.  EMS called and patient was CV 200 mg of fentanyl and transported here.  Denies any distal numbness or tingling to his right foot.  No knee pain.  No pain to his hip denies any rib pain      No past medical history on file.  There are no problems to display for this patient.        No family history on file.     Home Medications Prior to Admission medications   Not on File    Allergies    Patient has no allergy information on record.  Review of Systems   Review of Systems  All other systems reviewed and are negative.  Physical Exam Updated Vital Signs There were no vitals taken for this visit.  Physical Exam Vitals and nursing note reviewed.  Constitutional:      General: He is not in acute distress.    Appearance: Normal appearance. He is well-developed. He is not toxic-appearing.  HENT:     Head: Normocephalic and atraumatic.  Eyes:     General: Lids are normal.     Conjunctiva/sclera: Conjunctivae normal.     Pupils: Pupils are equal, round, and reactive to light.  Neck:     Thyroid: No thyroid mass.     Trachea: No tracheal deviation.  Cardiovascular:     Rate and Rhythm: Normal rate and regular rhythm.     Heart sounds: Normal heart sounds. No murmur heard.   No gallop.  Pulmonary:     Effort: Pulmonary effort is normal. No respiratory distress.     Breath sounds: Normal breath sounds. No stridor. No decreased breath sounds, wheezing, rhonchi or rales.  Abdominal:     General: There is no distension.     Palpations: Abdomen is soft.      Tenderness: There is no abdominal tenderness. There is no rebound.  Musculoskeletal:        General: No tenderness. Normal range of motion.     Cervical back: Normal range of motion and neck supple.       Legs:  Skin:    General: Skin is warm and dry.     Findings: No abrasion or rash.  Neurological:     Mental Status: He is alert and oriented to person, place, and time. Mental status is at baseline.     GCS: GCS eye subscore is 4. GCS verbal subscore is 5. GCS motor subscore is 6.     Cranial Nerves: Cranial nerves are intact. No cranial nerve deficit.     Sensory: No sensory deficit.     Motor: Motor function is intact.  Psychiatric:        Attention and Perception: Attention normal.        Speech: Speech normal.        Behavior: Behavior normal.    ED Results / Procedures / Treatments   Labs (all labs ordered are listed, but only abnormal results are displayed) Labs Reviewed  CBC WITH DIFFERENTIAL/PLATELET  BASIC METABOLIC PANEL  EKG None  Radiology No results found.  Procedures Procedures   Medications Ordered in ED Medications  0.9 %  sodium chloride infusion (has no administration in time range)    ED Course  I have reviewed the triage vital signs and the nursing notes.  Pertinent labs & imaging results that were available during my care of the patient were reviewed by me and considered in my medical decision making (see chart for details).    MDM Rules/Calculators/A&P                          Patient medicated for pain here. Patient with evidence of distal femur fracture.  Has been seen orthopedics.  Will admit to the medicine service. Final Clinical Impression(s) / ED Diagnoses Final diagnoses:  None    Rx / DC Orders ED Discharge Orders     None        Lacretia Leigh, MD 10/10/21 1541

## 2021-10-10 NOTE — Progress Notes (Signed)
Orthopedic Tech Progress Note Patient Details:  Bruce Ball 19-Jan-1946 [id number]  Level 2 trauma, downgraded to a non trauma, not needed at this second   Patient ID: Bruce Ball, male   DOB: [birth year], 75 y.o.   MRN: [id number]  Bruce Ball 10/10/2021, 2:08 PM

## 2021-10-10 NOTE — Consult Note (Signed)
Reason for Consult:Right femur fx Referring Physician: Lacretia Leigh Time called: 1497 Time at bedside: Bruce Ball is an 75 y.o. male.  HPI: Patty was in his garage and tripped over something and fell. He had immediate right thigh pain and could not get up. He was brought to the ED where x-rays showed a periprosthetic femur fx and orthopedic surgery was consulted. He is s/p ORIF of that leg to treat an osteosarcoma about 50 years ago. He lives at home with his wife and uses a variety of assistive devices when he ambulates.  No past medical history on file.  No family history on file.  Social History:  has no history on file for tobacco use, alcohol use, and drug use.  Allergies: Not on File  Medications: I have reviewed the patient's current medications.  Results for orders placed or performed during the hospital encounter of 10/10/21 (from the past 48 hour(s))  CBC with Differential/Platelet     Status: Abnormal   Collection Time: 10/10/21  2:00 PM  Result Value Ref Range   WBC 6.7 4.0 - 10.5 K/uL   RBC 5.17 4.22 - 5.81 MIL/uL   Hemoglobin 13.3 13.0 - 17.0 g/dL   HCT 41.4 39.0 - 52.0 %   MCV 80.1 80.0 - 100.0 fL   MCH 25.7 (L) 26.0 - 34.0 pg   MCHC 32.1 30.0 - 36.0 g/dL   RDW 16.5 (H) 11.5 - 15.5 %   Platelets 207 150 - 400 K/uL   nRBC 0.0 0.0 - 0.2 %   Neutrophils Relative % 62 %   Neutro Abs 4.2 1.7 - 7.7 K/uL   Lymphocytes Relative 23 %   Lymphs Abs 1.5 0.7 - 4.0 K/uL   Monocytes Relative 8 %   Monocytes Absolute 0.5 0.1 - 1.0 K/uL   Eosinophils Relative 5 %   Eosinophils Absolute 0.3 0.0 - 0.5 K/uL   Basophils Relative 1 %   Basophils Absolute 0.1 0.0 - 0.1 K/uL   Immature Granulocytes 1 %   Abs Immature Granulocytes 0.04 0.00 - 0.07 K/uL    Comment: Performed at [facility], [address] [place], [place] [address]  Basic metabolic panel     Status: Abnormal   Collection Time: 10/10/21  2:00 PM  Result Value Ref Range   Sodium 137 135 - 145  mmol/L   Potassium 3.8 3.5 - 5.1 mmol/L   Chloride 107 98 - 111 mmol/L   CO2 22 22 - 32 mmol/L   Glucose, Bld 133 (H) 70 - 99 mg/dL    Comment: Glucose reference range applies only to samples taken after fasting for at least 8 hours.   BUN 16 8 - 23 mg/dL   Creatinine, Ser 1.49 (H) 0.61 - 1.24 mg/dL   Calcium 8.6 (L) 8.9 - 10.3 mg/dL   GFR, Estimated 49 (L) >60 mL/min    Comment: (NOTE) Calculated using the CKD-EPI Creatinine Equation (2021)    Anion gap 8 5 - 15    Comment: Performed at [facility] [address], [place], [place] [address]    DG Pelvis Portable  Result Date: 10/10/2021 CLINICAL DATA:  RIGHT knee pain post fall EXAM: PORTABLE PELVIS 1-2 VIEWS COMPARISON:  Portable exam 1415 hours compared to 11/24/2020 FINDINGS: Prior L5-S1 fusion. RIGHT femoral prosthesis. Osseous demineralization diffusely. No fracture, dislocation, or bone destruction. Scattered pelvic surgical clips identified. IMPRESSION: RIGHT hip prosthesis and prior L5-S1 fusion. Osseous demineralization without acute bony  findings. Electronically Signed   By: Lavonia Dana M.D.   On: 10/10/2021 14:50   DG FEMUR PORT MIN 2 VIEWS LEFT  Result Date: 10/10/2021 CLINICAL DATA:  RIGHT knee pain post fall EXAM: LEFT FEMUR PORTABLE 2 VIEWS COMPARISON:  07/14/2019 RIGHT knee radiographs FINDINGS: Long RIGHT femoral prosthesis extending from hip joint to distal RIGHT femoral metaphysis. Comminuted periprosthetic fracture identified at distal RIGHT femoral diaphysis, displaced and angulated. Hip and knee joint alignments normal. Visualized pelvis intact. Osseous demineralization. IMPRESSION: Long RIGHT femoral prosthesis extending from femoral head to distal RIGHT femoral metaphysis. Comminuted angulated and displaced periprosthetic fracture at distal RIGHT femoral diaphysis. Osseous demineralization. Electronically Signed   By: Lavonia Dana M.D.   On: 10/10/2021 14:49    Review of Systems  HENT:  Negative for ear  discharge, ear pain, hearing loss and tinnitus.   Eyes:  Negative for photophobia and pain.  Respiratory:  Negative for cough and shortness of breath.   Cardiovascular:  Negative for chest pain.  Gastrointestinal:  Negative for abdominal pain, nausea and vomiting.  Genitourinary:  Negative for dysuria, flank pain, frequency and urgency.  Musculoskeletal:  Positive for arthralgias (Right femur). Negative for back pain, myalgias and neck pain.  Neurological:  Negative for dizziness and headaches.  Hematological:  Does not bruise/bleed easily.  Psychiatric/Behavioral:  The patient is not nervous/anxious.   Blood pressure (!) 164/75, pulse (!) 56, temperature (!) 97.4 F (36.3 C), temperature source Oral, resp. rate 18, height 5\' 11"  (1.803 m), weight 108 kg, SpO2 99 %. Physical Exam Constitutional:      General: He is not in acute distress.    Appearance: He is well-developed. He is not diaphoretic.  HENT:     Head: Normocephalic and atraumatic.  Eyes:     General: No scleral icterus.       Right eye: No discharge.        Left eye: No discharge.     Conjunctiva/sclera: Conjunctivae normal.  Cardiovascular:     Rate and Rhythm: Normal rate and regular rhythm.  Pulmonary:     Effort: Pulmonary effort is normal. No respiratory distress.  Musculoskeletal:     Cervical back: Normal range of motion.     Comments: RLE No traumatic wounds, ecchymosis, or rash  Severe TTP thigh  No knee or ankle effusion  Sens DPN, SPN, TN intact  Motor EHL, ext, flex, evers 5/5  DP 2+, PT 1+, No significant edema  Skin:    General: Skin is warm and dry.  Neurological:     Mental Status: He is alert.  Psychiatric:        Mood and Affect: Mood normal.        Behavior: Behavior normal.    Assessment/Plan: Right femur fx -- Plan ORIF tomorrow by Dr. Marcelino Scot.  Please keep NPO after MN. Bucks traction tonight.    Lisette Abu, PA-C Orthopedic Surgery [phone] 10/10/2021, 3:13 PM

## 2021-10-10 NOTE — Assessment & Plan Note (Signed)
Chronic. 

## 2021-10-10 NOTE — H&P (Signed)
History and Physical    Bruce Ball MAU:[id number] DOB: [birth year] DOA: 10/10/2021  PCP: [name], MD   Patient coming from: Home  I have personally briefly reviewed patient's old medical records in [place]  CC: fall HPI: 75 year old gentleman with a history of PE, CKD stage III, chronic diastolic heart failure, history of osteosarcoma status post surgery more than 25 years ago, reflux, coronary disease, ankylosing spondylitis who presents to the ER today after a fall at home.  Patient has been sedated with IV opiates he cannot give any history review of systems.  I did call the patient's wife Bruce Ball who states that she is out of town.  She was not notified until earlier this evening that the patient was in the hospital.  She does not know the circumstances of why the patient fell.  History review of systems otherwise unobtainable from the patient due to his current level of sedation.  According the chart, orthopedics at [facility] health have discussed the case with orthopedics at [facility].  Patient has a right periprosthetic fracture.  Patient has been accepted to a orthopedic bed at [facility].  Patient is currently on a wait list.  Patient other medical problems are currently stable.  EDP has requested [facility] hospitalist to admit the patient for pain control.   ED Course: Xrays show right periprosthetic fracture of right femure. [facility] has arranged transfer to [facility] for ORIF.  Review of Systems:  Review of Systems  Unable to perform ROS: Other  ROS unobtainable due to pt recently sedated with IV opiates  PMHX     Sochx: Pt is married. No alcohol, tobacco or drug use  FMHX    Prior to Admission medications   Not on File    Physical Exam: Vitals:   10/10/21 1745 10/10/21 2030 10/10/21 2200 10/10/21 2300  BP: (!) 147/93 117/86 134/63 130/74  Pulse: (!) 57 71 63 63  Resp: 13 10 (!) 8 (!) 8  Temp:      TempSrc:       SpO2: 94% 95% 94% 95%  Weight:      Height:        Physical Exam Vitals and nursing note reviewed.  Constitutional:      General: He is not in acute distress.    Comments: sedated  HENT:     Head: Normocephalic and atraumatic.     Nose: Nose normal.  Cardiovascular:     Rate and Rhythm: Normal rate and regular rhythm.  Pulmonary:     Effort: Pulmonary effort is normal. No respiratory distress.     Breath sounds: No wheezing or rales.  Abdominal:     General: Abdomen is flat. Bowel sounds are normal. There is no distension.     Tenderness: There is no abdominal tenderness. There is no guarding.  Musculoskeletal:     Comments: Right LE in buck's traction  Skin:    General: Skin is warm and dry.  Neurological:     Comments: sedated     Labs on Admission: I have personally reviewed following labs and imaging studies  CBC: Recent Labs  Lab 10/10/21 1400  WBC 6.7  NEUTROABS 4.2  HGB 13.3  HCT 41.4  MCV 80.1  PLT 563   Basic Metabolic Panel: Recent Labs  Lab 10/10/21 1400  NA 137  K 3.8  CL 107  CO2 22  GLUCOSE 133*  BUN 16  CREATININE 1.49*  CALCIUM 8.6*  GFR: Estimated Creatinine Clearance: 53.6 mL/min (A) (by C-G formula based on SCr of 1.49 mg/dL (H)). Liver Function Tests: No results for input(s): AST, ALT, ALKPHOS, BILITOT, PROT, ALBUMIN in the last 168 hours. No results for input(s): LIPASE, AMYLASE in the last 168 hours. No results for input(s): AMMONIA in the last 168 hours. Coagulation Profile: No results for input(s): INR, PROTIME in the last 168 hours. Cardiac Enzymes: No results for input(s): CKTOTAL, CKMB, CKMBINDEX, TROPONINI in the last 168 hours. BNP (last 3 results) No results for input(s): PROBNP in the last 8760 hours. HbA1C: No results for input(s): HGBA1C in the last 72 hours. CBG: No results for input(s): GLUCAP in the last 168 hours. Lipid Profile: No results for input(s): CHOL, HDL, LDLCALC, TRIG, CHOLHDL, LDLDIRECT in the  last 72 hours. Thyroid Function Tests: No results for input(s): TSH, T4TOTAL, FREET4, T3FREE, THYROIDAB in the last 72 hours. Anemia Panel: No results for input(s): VITAMINB12, FOLATE, FERRITIN, TIBC, IRON, RETICCTPCT in the last 72 hours. Urine analysis: No results found for: COLORURINE, APPEARANCEUR, LABSPEC, [place], GLUCOSEU, HGBUR, BILIRUBINUR, KETONESUR, PROTEINUR, UROBILINOGEN, NITRITE, LEUKOCYTESUR  Radiological Exams on Admission: I have personally reviewed images DG Pelvis Portable  Result Date: 10/10/2021 CLINICAL DATA:  RIGHT knee pain post fall EXAM: PORTABLE PELVIS 1-2 VIEWS COMPARISON:  Portable exam 1415 hours compared to 11/24/2020 FINDINGS: Prior L5-S1 fusion. RIGHT femoral prosthesis. Osseous demineralization diffusely. No fracture, dislocation, or bone destruction. Scattered pelvic surgical clips identified. IMPRESSION: RIGHT hip prosthesis and prior L5-S1 fusion. Osseous demineralization without acute bony findings. Electronically Signed   By: Lavonia Dana M.D.   On: 10/10/2021 14:50   DG FEMUR PORT MIN 2 VIEWS LEFT  Result Date: 10/10/2021 CLINICAL DATA:  RIGHT knee pain post fall EXAM: LEFT FEMUR PORTABLE 2 VIEWS COMPARISON:  07/14/2019 RIGHT knee radiographs FINDINGS: Long RIGHT femoral prosthesis extending from hip joint to distal RIGHT femoral metaphysis. Comminuted periprosthetic fracture identified at distal RIGHT femoral diaphysis, displaced and angulated. Hip and knee joint alignments normal. Visualized pelvis intact. Osseous demineralization. IMPRESSION: Long RIGHT femoral prosthesis extending from femoral head to distal RIGHT femoral metaphysis. Comminuted angulated and displaced periprosthetic fracture at distal RIGHT femoral diaphysis. Osseous demineralization. Electronically Signed   By: Lavonia Dana M.D.   On: 10/10/2021 14:49    EKG: I have personally reviewed EKG: shows NSR   Assessment/Plan Principal Problem:   Periprosthetic fracture of shaft of  femur Active Problems:   History of pulmonary embolism   HTN (hypertension)   Chronic diastolic CHF (congestive heart failure) (HCC)   GERD (gastroesophageal reflux disease)   Ankylosing spondylitis (HCC)   CKD (chronic kidney disease), stage III (HCC)   Hyperlipidemia   CAD (coronary artery disease)    Periprosthetic fracture of shaft of femur Admit to med/surg bed. Continue pain control. Holding Eliquis. Pt with hx of multiple PE. If surgery is going to be delayed for more than 24 hours, consider IV heparin gtts. Wife states pt has multiple pulmonary emboli within the last 12 months.  Orthopedics has arranged transfer to ortho bed at [place] currently on wait list for [facility]. EDP requested pt to be admitted for pain control. Rest of pt's medical issues are stable and not exacerbated.  History of pulmonary embolism Holding Eliquis in preparation for surgery. See above. If surgery going to be delayed for 24 hours, consider low dose heparin gtts to prevent PE in this patient with hx of multiple PE.  HTN (hypertension) Stable.  Chronic diastolic CHF (congestive  heart failure) (HCC) Stable. Euvolemic.  GERD (gastroesophageal reflux disease) On PPI.  Ankylosing spondylitis (HCC) Chronic.  CKD (chronic kidney disease), stage III (HCC) Chronic.  Hyperlipidemia Stable.  CAD (coronary artery disease) Stable.  DVT prophylaxis: Eliquis Code Status: Full Code Family Communication: discussed with pt's wife karen He  Disposition Plan: transfer to [place] called: orthopedics  Admission status: Observation, Med-Surg   Kristopher Oppenheim, DO [facility] 10/10/2021, 11:39 PM

## 2021-10-10 NOTE — ED Notes (Signed)
Got patient undressed into a gown on the monitor patient is resting with nurse at bedside

## 2021-10-10 NOTE — Subjective & Objective (Signed)
CC: fall HPI: 75 year old gentleman with a history of PE, CKD stage III, chronic diastolic heart failure, history of osteosarcoma status post surgery more than 25 years ago, reflux, coronary disease, ankylosing spondylitis who presents to the ER today after a fall at home.  Patient has been sedated with IV opiates he cannot give any history review of systems.  I did call the patient's wife Bruce Ball who states that she is out of town.  She was not notified until earlier this evening that the patient was in the hospital.  She does not know the circumstances of why the patient fell.  History review of systems otherwise unobtainable from the patient due to his current level of sedation.  According the chart, orthopedics at [facility] health have discussed the case with orthopedics at [facility].  Patient has a right periprosthetic fracture.  Patient has been accepted to a orthopedic bed at [facility].  Patient is currently on a wait list.  Patient other medical problems are currently stable.  EDP has requested [facility] hospitalist to admit the patient for pain control.

## 2021-10-10 NOTE — Assessment & Plan Note (Signed)
Admit to med/surg bed. Continue pain control. Holding Eliquis. Pt with hx of multiple PE. If surgery is going to be delayed for more than 24 hours, consider IV heparin gtts. Wife states pt has multiple pulmonary emboli within the last 12 months.  Orthopedics has arranged transfer to ortho bed at [place] currently on wait list for [facility]. EDP requested pt to be admitted for pain control. Rest of pt's medical issues are stable and not exacerbated.

## 2021-10-10 NOTE — Progress Notes (Signed)
Orthopedic Tech Progress Note Patient Details:  Bruce Ball [birth year] [id number]  Musculoskeletal Traction Type of Traction: Bucks Skin Traction Traction Location: RLE Traction Weight: 10 lbs   Post Interventions Patient Tolerated: Well Instructions Provided: Care of [place] 10/10/2021, 5:52 PM

## 2021-10-10 NOTE — Assessment & Plan Note (Signed)
On PPI

## 2021-10-10 NOTE — Progress Notes (Signed)
Called and discussed case with ortho on call, Dr. Marcelino Scot. Due to complexity of case with prosthesis/tumor history would like to see if we can get transferred to [facility] for surgery. Will not admit at this time and see if we can do ER to ER transfer. Discussed with ER physician, Dr. Laverta Baltimore, who will attempt to arrange this and keep Dr. Marcelino Scot notified.  Discussed with wife, Angelica Wix and patient.   Orma Flaming, MD [facility].

## 2021-10-10 NOTE — Assessment & Plan Note (Signed)
Stable. Euvolemic. ?

## 2021-10-10 NOTE — ED Provider Notes (Addendum)
Blood pressure (!) 147/93, pulse (!) 57, temperature (!) 97.4 F (36.3 C), temperature source Oral, resp. rate 13, height 5\' 11"  (1.803 m), weight 108 kg, SpO2 94 %.  Assuming care from Dr. Zenia Resides.  In short, MAE CIANCI is a 75 y.o. male with a chief complaint of Fall .  Refer to the original H&P for additional details.  Discussed patient's case with [facility] to request admission. Patient and family (if present) updated with plan. Care transferred to [facility] service.  I reviewed all nursing notes, vitals, pertinent old records, EKGs, labs, imaging (as available).   After further evaluation in our orthopedics team here at [facility] feels this would be best managed at [facility].  They have assisted with that discussion between our facility and [place].  Dr. Allayne Stack will accept to the waiting list.  The transfer center tells me that the patient is the only person on the orthopedic waiting list and they anticipate having a bed available for him tomorrow.  They will call when he is ready for transfer. They are not able to accommodate an ED-ED transfer at this time due to volume.    Margette Fast, MD 10/10/21 1806    Margette Fast, MD 10/10/21 2257

## 2021-10-10 NOTE — Assessment & Plan Note (Signed)
Holding Eliquis in preparation for surgery. See above. If surgery going to be delayed for 24 hours, consider low dose heparin gtts to prevent PE in this patient with hx of multiple PE.

## 2021-10-11 ENCOUNTER — Inpatient Hospital Stay (HOSPITAL_COMMUNITY): Payer: Medicare Other

## 2021-10-11 ENCOUNTER — Encounter (HOSPITAL_COMMUNITY)
Admission: EM | Disposition: A | Discharge status: Other Acute Inpatient Hospital | Payer: Self-pay | Source: Home / Self Care | Attending: Student

## 2021-10-11 ENCOUNTER — Encounter (HOSPITAL_COMMUNITY): Payer: Self-pay | Admitting: Internal Medicine

## 2021-10-11 ENCOUNTER — Encounter: Payer: Self-pay | Admitting: Student

## 2021-10-11 DIAGNOSIS — M978XXA Periprosthetic fracture around other internal prosthetic joint, initial encounter: Secondary | ICD-10-CM | POA: Diagnosis not present

## 2021-10-11 DIAGNOSIS — I130 Hypertensive heart and chronic kidney disease with heart failure and stage 1 through stage 4 chronic kidney disease, or unspecified chronic kidney disease: Secondary | ICD-10-CM | POA: Diagnosis present

## 2021-10-11 DIAGNOSIS — S72301A Unspecified fracture of shaft of right femur, initial encounter for closed fracture: Secondary | ICD-10-CM | POA: Diagnosis present

## 2021-10-11 DIAGNOSIS — G928 Other toxic encephalopathy: Secondary | ICD-10-CM | POA: Diagnosis not present

## 2021-10-11 DIAGNOSIS — I5032 Chronic diastolic (congestive) heart failure: Secondary | ICD-10-CM

## 2021-10-11 DIAGNOSIS — R262 Difficulty in walking, not elsewhere classified: Secondary | ICD-10-CM | POA: Diagnosis not present

## 2021-10-11 DIAGNOSIS — G9340 Encephalopathy, unspecified: Secondary | ICD-10-CM | POA: Diagnosis not present

## 2021-10-11 DIAGNOSIS — S7292XA Unspecified fracture of left femur, initial encounter for closed fracture: Secondary | ICD-10-CM

## 2021-10-11 DIAGNOSIS — I2510 Atherosclerotic heart disease of native coronary artery without angina pectoris: Secondary | ICD-10-CM | POA: Diagnosis present

## 2021-10-11 DIAGNOSIS — W010XXA Fall on same level from slipping, tripping and stumbling without subsequent striking against object, initial encounter: Secondary | ICD-10-CM | POA: Diagnosis present

## 2021-10-11 DIAGNOSIS — K219 Gastro-esophageal reflux disease without esophagitis: Secondary | ICD-10-CM | POA: Diagnosis present

## 2021-10-11 DIAGNOSIS — Z85830 Personal history of malignant neoplasm of bone: Secondary | ICD-10-CM | POA: Diagnosis not present

## 2021-10-11 DIAGNOSIS — W19XXXA Unspecified fall, initial encounter: Secondary | ICD-10-CM

## 2021-10-11 DIAGNOSIS — M5417 Radiculopathy, lumbosacral region: Secondary | ICD-10-CM | POA: Diagnosis present

## 2021-10-11 DIAGNOSIS — I10 Essential (primary) hypertension: Secondary | ICD-10-CM

## 2021-10-11 DIAGNOSIS — H9193 Unspecified hearing loss, bilateral: Secondary | ICD-10-CM | POA: Diagnosis present

## 2021-10-11 DIAGNOSIS — Z20822 Contact with and (suspected) exposure to covid-19: Secondary | ICD-10-CM | POA: Diagnosis present

## 2021-10-11 DIAGNOSIS — M9701XA Periprosthetic fracture around internal prosthetic right hip joint, initial encounter: Secondary | ICD-10-CM | POA: Diagnosis present

## 2021-10-11 DIAGNOSIS — M459 Ankylosing spondylitis of unspecified sites in spine: Secondary | ICD-10-CM | POA: Diagnosis not present

## 2021-10-11 DIAGNOSIS — K30 Functional dyspepsia: Secondary | ICD-10-CM | POA: Diagnosis present

## 2021-10-11 DIAGNOSIS — N1831 Chronic kidney disease, stage 3a: Secondary | ICD-10-CM | POA: Diagnosis present

## 2021-10-11 DIAGNOSIS — Z8546 Personal history of malignant neoplasm of prostate: Secondary | ICD-10-CM | POA: Diagnosis not present

## 2021-10-11 DIAGNOSIS — R4182 Altered mental status, unspecified: Secondary | ICD-10-CM | POA: Diagnosis not present

## 2021-10-11 DIAGNOSIS — T50995A Adverse effect of other drugs, medicaments and biological substances, initial encounter: Secondary | ICD-10-CM | POA: Diagnosis present

## 2021-10-11 DIAGNOSIS — Z7901 Long term (current) use of anticoagulants: Secondary | ICD-10-CM | POA: Diagnosis not present

## 2021-10-11 DIAGNOSIS — R296 Repeated falls: Secondary | ICD-10-CM | POA: Diagnosis present

## 2021-10-11 DIAGNOSIS — Y831 Surgical operation with implant of artificial internal device as the cause of abnormal reaction of the patient, or of later complication, without mention of misadventure at the time of the procedure: Secondary | ICD-10-CM | POA: Diagnosis present

## 2021-10-11 DIAGNOSIS — Z86711 Personal history of pulmonary embolism: Secondary | ICD-10-CM | POA: Diagnosis not present

## 2021-10-11 DIAGNOSIS — E669 Obesity, unspecified: Secondary | ICD-10-CM | POA: Diagnosis present

## 2021-10-11 DIAGNOSIS — Z6833 Body mass index (BMI) 33.0-33.9, adult: Secondary | ICD-10-CM | POA: Diagnosis not present

## 2021-10-11 DIAGNOSIS — E785 Hyperlipidemia, unspecified: Secondary | ICD-10-CM | POA: Diagnosis present

## 2021-10-11 DIAGNOSIS — Z981 Arthrodesis status: Secondary | ICD-10-CM | POA: Diagnosis not present

## 2021-10-11 DIAGNOSIS — Z96649 Presence of unspecified artificial hip joint: Secondary | ICD-10-CM | POA: Diagnosis not present

## 2021-10-11 DIAGNOSIS — R52 Pain, unspecified: Secondary | ICD-10-CM | POA: Diagnosis present

## 2021-10-11 LAB — BLOOD GAS, VENOUS
Acid-Base Excess: 0 mmol/L (ref 0.0–2.0)
Bicarbonate: 24.5 mmol/L (ref 20.0–28.0)
FIO2: 21
O2 Saturation: 84.2 %
Patient temperature: 37
pCO2, Ven: 42.6 mmHg — ABNORMAL LOW (ref 44.0–60.0)
pH, Ven: 7.378 (ref 7.250–7.430)
pO2, Ven: 49.7 mmHg — ABNORMAL HIGH (ref 32.0–45.0)

## 2021-10-11 LAB — AMMONIA: Ammonia: 29 umol/L (ref 9–35)

## 2021-10-11 LAB — CBC WITH DIFFERENTIAL/PLATELET
Abs Immature Granulocytes: 0.01 10*3/uL (ref 0.00–0.07)
Basophils Absolute: 0.1 10*3/uL (ref 0.0–0.1)
Basophils Relative: 1 %
Eosinophils Absolute: 0.2 10*3/uL (ref 0.0–0.5)
Eosinophils Relative: 2 %
HCT: 41.3 % (ref 39.0–52.0)
Hemoglobin: 12.7 g/dL — ABNORMAL LOW (ref 13.0–17.0)
Immature Granulocytes: 0 %
Lymphocytes Relative: 16 %
Lymphs Abs: 1.4 10*3/uL (ref 0.7–4.0)
MCH: 25.1 pg — ABNORMAL LOW (ref 26.0–34.0)
MCHC: 30.8 g/dL (ref 30.0–36.0)
MCV: 81.8 fL (ref 80.0–100.0)
Monocytes Absolute: 1 10*3/uL (ref 0.1–1.0)
Monocytes Relative: 12 %
Neutro Abs: 6 10*3/uL (ref 1.7–7.7)
Neutrophils Relative %: 69 %
Platelets: 206 10*3/uL (ref 150–400)
RBC: 5.05 MIL/uL (ref 4.22–5.81)
RDW: 16.9 % — ABNORMAL HIGH (ref 11.5–15.5)
WBC: 8.7 10*3/uL (ref 4.0–10.5)
nRBC: 0 % (ref 0.0–0.2)

## 2021-10-11 LAB — COMPREHENSIVE METABOLIC PANEL
ALT: 18 U/L (ref 0–44)
AST: 25 U/L (ref 15–41)
Albumin: 2.9 g/dL — ABNORMAL LOW (ref 3.5–5.0)
Alkaline Phosphatase: 74 U/L (ref 38–126)
Anion gap: 8 (ref 5–15)
BUN: 18 mg/dL (ref 8–23)
CO2: 23 mmol/L (ref 22–32)
Calcium: 8.3 mg/dL — ABNORMAL LOW (ref 8.9–10.3)
Chloride: 106 mmol/L (ref 98–111)
Creatinine, Ser: 1.5 mg/dL — ABNORMAL HIGH (ref 0.61–1.24)
GFR, Estimated: 48 mL/min — ABNORMAL LOW (ref >60)
Glucose, Bld: 117 mg/dL — ABNORMAL HIGH (ref 70–99)
Potassium: 4 mmol/L (ref 3.5–5.1)
Sodium: 137 mmol/L (ref 135–145)
Total Bilirubin: 0.8 mg/dL (ref 0.3–1.2)
Total Protein: 5.4 g/dL — ABNORMAL LOW (ref 6.5–8.1)

## 2021-10-11 LAB — RAPID URINE DRUG SCREEN, HOSP PERFORMED
Amphetamines: NOT DETECTED
Barbiturates: NOT DETECTED
Benzodiazepines: NOT DETECTED
Cocaine: NOT DETECTED
Opiates: POSITIVE — AB
Tetrahydrocannabinol: NOT DETECTED

## 2021-10-11 LAB — VITAMIN B12: Vitamin B-12: 278 pg/mL (ref 180–914)

## 2021-10-11 LAB — TSH: TSH: 1.806 u[IU]/mL (ref 0.350–4.500)

## 2021-10-11 LAB — MAGNESIUM: Magnesium: 2.1 mg/dL (ref 1.7–2.4)

## 2021-10-11 LAB — SURGICAL PCR SCREEN
MRSA, PCR: NEGATIVE
Staphylococcus aureus: NEGATIVE

## 2021-10-11 SURGERY — OPEN REDUCTION INTERNAL FIXATION (ORIF) DISTAL FEMUR FRACTURE
Anesthesia: General | Laterality: Right

## 2021-10-11 MED ORDER — ONDANSETRON HCL 4 MG/2ML IJ SOLN
4.0000 mg | Freq: Four times a day (QID) | INTRAMUSCULAR | Status: DC | PRN
  Administered 2021-10-11 – 2021-10-13 (×2): 4 mg via INTRAVENOUS
  Filled 2021-10-11 (×3): qty 2

## 2021-10-11 MED ORDER — INDAPAMIDE 1.25 MG PO TABS: 1.2500 mg | ORAL_TABLET | Freq: Every day | ORAL | Status: DC

## 2021-10-11 MED ORDER — KCL IN DEXTROSE-NACL 20-5-0.9 MEQ/L-%-% IV SOLN
INTRAVENOUS | Status: DC
  Filled 2021-10-11 (×6): qty 1000

## 2021-10-11 MED ORDER — CARVEDILOL 25 MG PO TABS
25.0000 mg | ORAL_TABLET | Freq: Two times a day (BID) | ORAL | Status: DC
  Administered 2021-10-12 – 2021-10-13 (×4): 25 mg via ORAL
  Filled 2021-10-11 (×5): qty 1

## 2021-10-11 MED ORDER — POVIDONE-IODINE 10 % EX SWAB
2.0000 "application " | Freq: Once | CUTANEOUS | Status: AC
  Administered 2021-10-11: 2 via TOPICAL

## 2021-10-11 MED ORDER — VITAMIN D 25 MCG (1000 UNIT) PO TABS
2000.0000 [IU] | ORAL_TABLET | Freq: Two times a day (BID) | ORAL | Status: DC
  Administered 2021-10-12 – 2021-10-13 (×2): 2000 [IU] via ORAL
  Filled 2021-10-11 (×3): qty 2

## 2021-10-11 MED ORDER — GABAPENTIN 300 MG PO CAPS
300.0000 mg | ORAL_CAPSULE | Freq: Every day | ORAL | Status: DC
  Administered 2021-10-12: 300 mg via ORAL
  Filled 2021-10-11: qty 1

## 2021-10-11 MED ORDER — ADULT MULTIVITAMIN W/MINERALS CH
1.0000 | ORAL_TABLET | Freq: Every day | ORAL | Status: DC
  Administered 2021-10-12 – 2021-10-13 (×2): 1 via ORAL
  Filled 2021-10-11 (×3): qty 1

## 2021-10-11 MED ORDER — CHLORHEXIDINE GLUCONATE 4 % EX LIQD
60.0000 mL | Freq: Once | CUTANEOUS | Status: AC
  Administered 2021-10-11: 4 via TOPICAL

## 2021-10-11 MED ORDER — ACETAMINOPHEN 325 MG PO TABS: 650.0000 mg | ORAL_TABLET | Freq: Four times a day (QID) | ORAL | Status: DC | PRN

## 2021-10-11 MED ORDER — ATORVASTATIN CALCIUM 10 MG PO TABS
20.0000 mg | ORAL_TABLET | Freq: Every day | ORAL | Status: DC
  Administered 2021-10-12 – 2021-10-13 (×2): 20 mg via ORAL
  Filled 2021-10-11 (×2): qty 2

## 2021-10-11 MED ORDER — ACETAMINOPHEN 650 MG RE SUPP: 650.0000 mg | Freq: Four times a day (QID) | RECTAL | Status: DC | PRN

## 2021-10-11 MED ORDER — CEFAZOLIN SODIUM-DEXTROSE 2-4 GM/100ML-% IV SOLN: 2.0000 g | INTRAVENOUS | Status: AC

## 2021-10-11 MED ORDER — METHOCARBAMOL 1000 MG/10ML IJ SOLN
500.0000 mg | Freq: Three times a day (TID) | INTRAVENOUS | Status: DC
  Administered 2021-10-11 – 2021-10-12 (×3): 500 mg via INTRAVENOUS
  Filled 2021-10-11: qty 5
  Filled 2021-10-11: qty 500
  Filled 2021-10-11 (×3): qty 5
  Filled 2021-10-11: qty 500

## 2021-10-11 MED ORDER — LACTATED RINGERS IV SOLN: INTRAVENOUS | Status: DC

## 2021-10-11 MED ORDER — HEPARIN SODIUM (PORCINE) 5000 UNIT/ML IJ SOLN
5000.0000 [IU] | Freq: Three times a day (TID) | INTRAMUSCULAR | Status: DC
  Administered 2021-10-11 – 2021-10-13 (×7): 5000 [IU] via SUBCUTANEOUS
  Filled 2021-10-11 (×7): qty 1

## 2021-10-11 MED ORDER — DULOXETINE HCL 30 MG PO CPEP
30.0000 mg | ORAL_CAPSULE | Freq: Every day | ORAL | Status: DC
  Administered 2021-10-12 – 2021-10-13 (×2): 30 mg via ORAL
  Filled 2021-10-11 (×3): qty 1

## 2021-10-11 MED ORDER — LABETALOL HCL 5 MG/ML IV SOLN: 10.0000 mg | INTRAVENOUS | Status: DC | PRN

## 2021-10-11 MED ORDER — ONDANSETRON HCL 4 MG PO TABS: 4.0000 mg | ORAL_TABLET | Freq: Four times a day (QID) | ORAL | Status: DC | PRN

## 2021-10-11 MED ORDER — PANTOPRAZOLE SODIUM 40 MG PO TBEC
40.0000 mg | DELAYED_RELEASE_TABLET | Freq: Two times a day (BID) | ORAL | Status: DC
  Administered 2021-10-12: 40 mg via ORAL
  Filled 2021-10-11 (×2): qty 1

## 2021-10-11 NOTE — Progress Notes (Addendum)
Pt on admission yo unit informed brought cash in his pocket total [phone]  [place] with credit cards and glasses to safe witnessed amount with nurse in charge [facility] RN. Pt informed

## 2021-10-11 NOTE — Plan of Care (Signed)

## 2021-10-11 NOTE — TOC CAGE-AID Note (Signed)
Transition of Care [facility]) - CAGE-AID Screening   Patient Details  Name: Bruce Ball MRN: [id number] Date of Birth: Aug 31, 1946  Transition of Care [facility]) CM/SW Contact:    Bruce Ball, [place] Phone Number: 10/11/2021, 11:14 AM   Clinical Narrative: Pt is unable to participate in Cage Aid.  Bruce Ball, MSW, LCSW-A Pronouns:  She/Her/Hers [facility] Clinical Social Worker Direct Number:  [phone] [email or web address]    CAGE-AID Screening: Substance Abuse Screening unable to be completed due to: : Patient unable to participate             Substance Abuse Education Offered: No

## 2021-10-11 NOTE — Discharge Summary (Signed)
Physician Discharge Summary  Bruce Ball ZGY:[id number] DOB: Mar 31, 1946 DOA: 10/10/2021  PCP: [name], MD  Admit date: 10/10/2021 Discharge date: 10/11/2021 Admitted From: Home Disposition: [facility]  Discharge Condition: Stable CODE STATUS: Full code  Hospital Course: 75 year old M with PMH of PE, CKD-3, diastolic CHF, osteosarcoma s/p surgical resection, CAD, ankylosing spondylitis, lumbar sacral radiculopathy, recurrent falls, prostate cancer, HTN and GERD brought to ED after fall at home.  Reportedly struck his right hip and right thigh.  Unknown mechanism of fall.  Reportedly, patient was by himself at home.  Patient's wife was out of town.  X-ray showed right periprosthetic fracture of right femur.  Orthopedic surgery consulted and recommended transfer to [facility].  Patient has been accepted by orthopedic surgeon, Dr. Allayne Stack  at [facility] pending bed availability.   See individual problem list below for more on hospital course.  Discharge Diagnoses:  Unwitnessed fall at home Periprosthetic fracture of shaft of femur -[facility] orthopedic surgery recommended transfer to [facility] given history. -Dr. Allayne Stack at [facility] accepted patient pending bed availability. -Called transfer center today about 3 PM.  Still no bed open but hopeful for this evening -Complete bedrest with traction weight to RLE. -Pain control with home Cymbalta and gabapentin, and as needed Dilaudid and Robaxin -N.p.o. except sips with meds. -Change LR to D5-NS-KCl while NPO.  Acute encephalopathy: Likely iatrogenic from pain medications.  No focal neurodeficit.  He is awake and oriented to self.  -Check CT head and basic encephalopathy labs. -Reorientation and delirium precautions.  History of pulmonary embolism: On prophylactic dose Eliquis at home. -Hold home Eliquis -Subcu heparin -Monitor H&H   HTN (hypertension): BP slightly elevated. -Resume home Coreg   Chronic diastolic CHF (congestive  heart failure) ([place]): No cardiopulmonary symptoms.  Appears euvolemic on exam.   -Monitor fluid status while on IV fluid   GERD (gastroesophageal reflux disease) -Continue PPI   History of ankylosing spondylitis/lumbosacral radiculopathy-chronic -Resume home Cymbalta and gabapentin. -Robaxin and Dilaudid as above   CKD-3A: Stable. Recent Labs (within last 365 days)      Recent Labs    10/10/21 1400 10/11/21 0645  BUN 16 18  CREATININE 1.49* 1.50*      Hyperlipidemia -Continue statin   History of CAD: No anginal symptoms. -Continue home meds   Ambulatory dysfunction -Currently strict bedrest   Class I obesity Body mass index is 33.19 kg/m.           Discharge Exam: Vitals:   10/11/21 0240 10/11/21 0314 10/11/21 0838 10/11/21 1430  BP:  (!) 151/75 (!) 148/76 (!) 172/84  Pulse:  64 65 80  Temp: 98.5 F (36.9 C) 97.9 F (36.6 C) 98.2 F (36.8 C)   Resp:   19   Height:      Weight:      SpO2:  97% 97% 96%  TempSrc: Oral Oral Oral   BMI (Calculated):         GENERAL: No apparent distress.  Nontoxic. HEENT: MMM.  Vision and hearing grossly intact.  NECK: Supple.  No apparent JVD.  RESP: 97% on RA.  No IWOB.  Fair aeration bilaterally. CVS:  RRR. Heart sounds normal.  ABD/GI/GU: BS+. Abd soft, NTND.  MSK/EXT:  Moves extremities. No apparent deformity. No edema.  SKIN: no apparent skin lesion or wound NEURO: Sleepy but wakes to voice easily.  Oriented to self.  No apparent focal neuro deficit but limited exam. PSYCH: Calm.  Dermatology grimacing.  Discharge Instructions  Allergies as of 10/11/2021   Not on File      Medication List     TAKE these medications    apixaban 5 MG Tabs tablet Commonly known as: ELIQUIS Take 2.5 mg by mouth 2 (two) times daily.   atorvastatin 20 MG tablet Commonly known as: LIPITOR Take 20 mg by mouth at bedtime.   capsaicin 0.025 % cream Commonly known as: ZOSTRIX Apply 1 application topically 2 (two) times  daily as needed (joint pain except hands).   carvedilol 25 MG tablet Commonly known as: COREG Take 25 mg by mouth See admin instructions. Twice daily. **do not take if systolic blood pressure is less than 110 or if heart rate is less than 55 beats per minute**   DULoxetine 30 MG capsule Commonly known as: CYMBALTA Take 30 mg by mouth daily.   ferrous sulfate 325 (65 FE) MG tablet Take 325 mg by mouth every Monday, Wednesday, and Friday.   gabapentin 300 MG capsule Commonly known as: NEURONTIN Take 300 mg by mouth at bedtime.   indapamide 2.5 MG tablet Commonly known as: LOZOL Take 1.25 mg by mouth daily.   multivitamin with minerals tablet Take 1 tablet by mouth daily.   pantoprazole 40 MG tablet Commonly known as: PROTONIX Take 40 mg by mouth 2 (two) times daily before a meal.   SYSTANE BALANCE OP Place 1 drop into both eyes in the morning and at bedtime.   Vitamin D3 50 MCG (2000 UT) Tabs Take 2,000 Units by mouth 2 (two) times daily with a meal.        Consultations: Orthopedic surgery  Procedures/Studies:   DG Pelvis Portable  Result Date: 10/10/2021 CLINICAL DATA:  RIGHT knee pain post fall EXAM: PORTABLE PELVIS 1-2 VIEWS COMPARISON:  Portable exam 1415 hours compared to 11/24/2020 FINDINGS: Prior L5-S1 fusion. RIGHT femoral prosthesis. Osseous demineralization diffusely. No fracture, dislocation, or bone destruction. Scattered pelvic surgical clips identified. IMPRESSION: RIGHT hip prosthesis and prior L5-S1 fusion. Osseous demineralization without acute bony findings. Electronically Signed   By: Lavonia Dana M.D.   On: 10/10/2021 14:50   DG FEMUR PORT MIN 2 VIEWS LEFT  Result Date: 10/10/2021 CLINICAL DATA:  RIGHT knee pain post fall EXAM: LEFT FEMUR PORTABLE 2 VIEWS COMPARISON:  07/14/2019 RIGHT knee radiographs FINDINGS: Long RIGHT femoral prosthesis extending from hip joint to distal RIGHT femoral metaphysis. Comminuted periprosthetic fracture identified  at distal RIGHT femoral diaphysis, displaced and angulated. Hip and knee joint alignments normal. Visualized pelvis intact. Osseous demineralization. IMPRESSION: Long RIGHT femoral prosthesis extending from femoral head to distal RIGHT femoral metaphysis. Comminuted angulated and displaced periprosthetic fracture at distal RIGHT femoral diaphysis. Osseous demineralization. Electronically Signed   By: Lavonia Dana M.D.   On: 10/10/2021 14:49       The results of significant diagnostics from this hospitalization (including imaging, microbiology, ancillary and laboratory) are listed below for reference.     Microbiology: Recent Results (from the past 240 hour(s))  Resp Panel by RT-PCR (Flu A&B, Covid) Nasopharyngeal Swab     Status: None   Collection Time: 10/10/21  2:16 PM   Specimen: Nasopharyngeal Swab; Nasopharyngeal(NP) swabs in vial transport medium  Result Value Ref Range Status   SARS Coronavirus 2 by RT PCR NEGATIVE NEGATIVE Final    Comment: (NOTE) SARS-CoV-2 target nucleic acids are NOT DETECTED.  The SARS-CoV-2 RNA is generally detectable in upper respiratory specimens during the acute phase of infection. The lowest concentration of SARS-CoV-2 viral copies this assay can  detect is 138 copies/mL. A negative result does not preclude SARS-Cov-2 infection and should not be used as the sole basis for treatment or other patient management decisions. A negative result may occur with  improper specimen collection/handling, submission of specimen other than nasopharyngeal swab, presence of viral mutation(s) within the areas targeted by this assay, and inadequate number of viral copies(<138 copies/mL). A negative result must be combined with clinical observations, patient history, and epidemiological information. The expected result is Negative.  Fact Sheet for Patients:  EntrepreneurPulse.com.au  Fact Sheet for Healthcare Providers:   IncredibleEmployment.be  This test is no t yet approved or cleared by the [place] FDA and  has been authorized for detection and/or diagnosis of SARS-CoV-2 by FDA under an Emergency Use Authorization (EUA). This EUA will remain  in effect (meaning this test can be used) for the duration of the COVID-19 declaration under Section 564(b)(1) of the Act, 21 U.S.C.section 360bbb-3(b)(1), unless the authorization is terminated  or revoked sooner.       Influenza A by PCR NEGATIVE NEGATIVE Final   Influenza B by PCR NEGATIVE NEGATIVE Final    Comment: (NOTE) The Xpert Xpress SARS-CoV-2/FLU/RSV plus assay is intended as an aid in the diagnosis of influenza from Nasopharyngeal swab specimens and should not be used as a sole basis for treatment. Nasal washings and aspirates are unacceptable for Xpert Xpress SARS-CoV-2/FLU/RSV testing.  Fact Sheet for Patients: EntrepreneurPulse.com.au  Fact Sheet for Healthcare Providers: IncredibleEmployment.be  This test is not yet approved or cleared by the [place] FDA and has been authorized for detection and/or diagnosis of SARS-CoV-2 by FDA under an Emergency Use Authorization (EUA). This EUA will remain in effect (meaning this test can be used) for the duration of the COVID-19 declaration under Section 564(b)(1) of the Act, 21 U.S.C. section 360bbb-3(b)(1), unless the authorization is terminated or revoked.  Performed at [facility], [place] [address] [place], [place] [address]   Surgical pcr screen     Status: None   Collection Time: 10/11/21  6:36 AM   Specimen: Nasal Mucosa; Nasal Swab  Result Value Ref Range Status   MRSA, PCR NEGATIVE NEGATIVE Final   Staphylococcus aureus NEGATIVE NEGATIVE Final    Comment: (NOTE) The Xpert SA Assay (FDA approved for NASAL specimens in patients 34 years of age and older), is one component of a comprehensive surveillance program.  It is not intended to diagnose infection nor to guide or monitor treatment. Performed at [facility], [place] [address] [place], [place] [address]      Labs:  CBC: Recent Labs  Lab 10/10/21 1400 10/11/21 0645  WBC 6.7 8.7  NEUTROABS 4.2 6.0  HGB 13.3 12.7*  HCT 41.4 41.3  MCV 80.1 81.8  PLT 207 206   BMP &GFR Recent Labs  Lab 10/10/21 1400 10/11/21 0645  NA 137 137  K 3.8 4.0  CL 107 106  CO2 22 23  GLUCOSE 133* 117*  BUN 16 18  CREATININE 1.49* 1.50*  CALCIUM 8.6* 8.3*  MG  --  2.1   Estimated Creatinine Clearance: 53.2 mL/min (A) (by C-G formula based on SCr of 1.5 mg/dL (H)). Liver & Pancreas: Recent Labs  Lab 10/11/21 0645  AST 25  ALT 18  ALKPHOS 74  BILITOT 0.8  PROT 5.4*  ALBUMIN 2.9*   No results for input(s): LIPASE, AMYLASE in the last 168 hours. No results for input(s): AMMONIA in the last 168 hours. Diabetic: No results for input(s):  HGBA1C in the last 72 hours. No results for input(s): GLUCAP in the last 168 hours. Cardiac Enzymes: No results for input(s): CKTOTAL, CKMB, CKMBINDEX, TROPONINI in the last 168 hours. No results for input(s): PROBNP in the last 8760 hours. Coagulation Profile: No results for input(s): INR, PROTIME in the last 168 hours. Thyroid Function Tests: No results for input(s): TSH, T4TOTAL, FREET4, T3FREE, THYROIDAB in the last 72 hours. Lipid Profile: No results for input(s): CHOL, HDL, LDLCALC, TRIG, CHOLHDL, LDLDIRECT in the last 72 hours. Anemia Panel: No results for input(s): VITAMINB12, FOLATE, FERRITIN, TIBC, IRON, RETICCTPCT in the last 72 hours. Urine analysis: No results found for: COLORURINE, APPEARANCEUR, LABSPEC, PHURINE, GLUCOSEU, HGBUR, BILIRUBINUR, KETONESUR, PROTEINUR, UROBILINOGEN, NITRITE, LEUKOCYTESUR Sepsis Labs: Invalid input(s): PROCALCITONIN, LACTICIDVEN   Time coordinating discharge: 55 minutes  SIGNED:  Mercy Riding, MD  [facility] 10/11/2021, 4:03 PM

## 2021-10-11 NOTE — Progress Notes (Signed)
Patient ID: Bruce Ball, male   DOB: 04-Dec-1946, 75 y.o.   MRN: [id number]   LOS: 1 day   Subjective: Having significant pain.   Objective: Vital signs in last 24 hours: Temp:  [97.4 F (36.3 C)-98.5 F (36.9 C)] 98.2 F (36.8 C) (10/13 0838) Pulse Rate:  [56-71] 65 (10/13 0838) Resp:  [8-20] 19 (10/13 0838) BP: (117-164)/(60-96) 148/76 (10/13 0838) SpO2:  [92 %-99 %] 97 % (10/13 0838) Weight:  [108 kg] 108 kg (10/12 1431)     Laboratory  CBC Recent Labs    10/10/21 1400 10/11/21 0645  WBC 6.7 8.7  HGB 13.3 12.7*  HCT 41.4 41.3  PLT 207 206   BMET Recent Labs    10/10/21 1400 10/11/21 0645  NA 137 137  K 3.8 4.0  CL 107 106  CO2 22 23  GLUCOSE 133* 117*  BUN 16 18  CREATININE 1.49* 1.50*  CALCIUM 8.6* 8.3*     Physical Exam General appearance: alert and mild distress RLE: 2+ DP, in [place]   Assessment/Plan: Right periprosthetic femur fx -- Plan transfer to [facility] for definitive care. Hopeful for today.    Lisette Abu, PA-C Orthopedic Surgery [phone] 10/11/2021

## 2021-10-11 NOTE — Progress Notes (Addendum)
PROGRESS NOTE  Bruce Ball [id number] DOB: [birth year]   PCP: [name], MD  Patient is from: Home.  DOA: 10/10/2021 LOS: 1  Chief complaints:  Chief Complaint  Patient presents with   Fall     Brief Narrative / Interim history: 75 year old M with PMH of PE, CKD-3, diastolic CHF, osteosarcoma s/p surgical resection, CAD, ankylosing spondylitis, lumbar sacral radiculopathy, recurrent falls, prostate cancer, HTN and GERD brought to ED after fall at home.  Reportedly struck his right hip and right thigh.  Unknown mechanism of fall.  Reportedly, patient was by himself at home.  Patient's wife was out of town.  X-ray showed right periprosthetic fracture of right femur.  Orthopedic surgery consulted and recommended transfer to [facility].  Patient has been accepted by orthopedic surgeon, Dr. Allayne Stack  at [facility] pending bed availability.   Subjective: Seen and examined earlier this morning.  No major events overnight of this morning.  Sleepy but wakes to voice.  Only oriented to self.  Grimacing intermittently.   Objective: Vitals:   10/11/21 0200 10/11/21 0240 10/11/21 0314 10/11/21 0838  BP: (!) 141/68  (!) 151/75 (!) 148/76  Pulse: 70  64 65  Resp: 15   19  Temp:  98.5 F (36.9 C) 97.9 F (36.6 C) 98.2 F (36.8 C)  TempSrc:  Oral Oral Oral  SpO2: 94%  97% 97%  Weight:      Height:        Intake/Output Summary (Last 24 hours) at 10/11/2021 1317 Last data filed at 10/11/2021 1114 Gross per 24 hour  Intake 0 ml  Output 700 ml  Net -700 ml   Filed Weights   10/10/21 1431  Weight: 108 kg    Examination:  GENERAL: No apparent distress.  Nontoxic. HEENT: MMM.  Vision and hearing grossly intact.  NECK: Supple.  No apparent JVD.  RESP: 97% on RA.  No IWOB.  Fair aeration bilaterally. CVS:  RRR. Heart sounds normal.  ABD/GI/GU: BS+. Abd soft, NTND.  MSK/EXT:  Moves extremities. No apparent deformity. No edema.  SKIN: no apparent skin lesion or wound NEURO:  Sleepy but wakes to voice easily.  Oriented to self.  No apparent focal neuro deficit but limited exam. PSYCH: Calm.  Dermatology grimacing.  Procedures:  None  Microbiology summarized: [id number] and influenza PCR nonreactive.  Assessment & Plan: Unwitnessed fall at home Periprosthetic fracture of shaft of femur -[facility] orthopedic surgery recommended transfer to [place] given history. -Dr. Allayne Stack at [facility] accepted patient pending bed availability. -Complete bedrest with traction weight to RLE. -Pain control with home Cymbalta and gabapentin, and as needed Dilaudid and Robaxin   History of pulmonary embolism: On prophylactic dose Eliquis at home. -Hold home Eliquis -Subcu heparin -Monitor H&H  Acute encephalopathy: Likely iatrogenic from pain medication but need to rule out other potential etiologies given fall.  Does not seem to have focal neurodeficit.  -CT head without contrast -Basic encephalopathy labs   HTN (hypertension): BP slightly elevated. -Resume home Coreg   Chronic diastolic CHF (congestive heart failure) ([place]): No cardiopulmonary symptoms.  Appears euvolemic on exam.   -Monitor fluid status while on IV fluid   GERD (gastroesophageal reflux disease) -Continue PPI   History of ankylosing spondylitis/lumbosacral radiculopathy-chronic -Resume home Cymbalta and gabapentin. -Robaxin and Dilaudid as above  CKD-3A: Stable. Recent Labs    10/10/21 1400 10/11/21 0645  BUN 16 18  CREATININE 1.49* 1.50*   Hyperlipidemia -Continue statin   History of CAD: No anginal  symptoms. -Continue home meds  Ambulatory dysfunction -Currently strict bedrest  Hard of hearing: -Uses hearing aid to left ear.  Class I obesity Body mass index is 33.19 kg/m.         DVT prophylaxis:  SCDs Start: 10/11/21 6503  Code Status: Full code Family Communication: Updated patient's wife over the phone. Level of care: Med-Surg Status is: Observation  The patient will  require care spanning > 2 midnights and should be moved to inpatient because patient needs IV fluid, IV pain medication, further evaluation for encephalopathy and close monitoring while awaiting on transfer to tertiary center for surgical intervention   Consultants:  Orthopedic surgery   Sch Meds:  Scheduled Meds: Continuous Infusions:  sodium chloride 10 mL/hr at 10/10/21 2302    ceFAZolin (ANCEF) IV     lactated ringers 75 mL/hr at 10/11/21 1013   PRN Meds:.acetaminophen **OR** acetaminophen, HYDROmorphone (DILAUDID) injection, ondansetron **OR** ondansetron (ZOFRAN) IV  Antimicrobials: Anti-infectives (From admission, onward)    Start     Dose/Rate Route Frequency Ordered Stop   10/11/21 1700  ceFAZolin (ANCEF) IVPB 2g/100 mL premix        2 g 200 mL/hr over 30 Minutes Intravenous To [facility]Stay Surgical 10/11/21 0559 10/12/21 1700        I have personally reviewed the following labs and images: CBC: Recent Labs  Lab 10/10/21 1400 10/11/21 0645  WBC 6.7 8.7  NEUTROABS 4.2 6.0  HGB 13.3 12.7*  HCT 41.4 41.3  MCV 80.1 81.8  PLT 207 206   BMP &GFR Recent Labs  Lab 10/10/21 1400 10/11/21 0645  NA 137 137  K 3.8 4.0  CL 107 106  CO2 22 23  GLUCOSE 133* 117*  BUN 16 18  CREATININE 1.49* 1.50*  CALCIUM 8.6* 8.3*  MG  --  2.1   Estimated Creatinine Clearance: 53.2 mL/min (A) (by C-G formula based on SCr of 1.5 mg/dL (H)). Liver & Pancreas: Recent Labs  Lab 10/11/21 0645  AST 25  ALT 18  ALKPHOS 74  BILITOT 0.8  PROT 5.4*  ALBUMIN 2.9*   No results for input(s): LIPASE, AMYLASE in the last 168 hours. No results for input(s): AMMONIA in the last 168 hours. Diabetic: No results for input(s): HGBA1C in the last 72 hours. No results for input(s): GLUCAP in the last 168 hours. Cardiac Enzymes: No results for input(s): CKTOTAL, CKMB, CKMBINDEX, TROPONINI in the last 168 hours. No results for input(s): PROBNP in the last 8760 hours. Coagulation  Profile: No results for input(s): INR, PROTIME in the last 168 hours. Thyroid Function Tests: No results for input(s): TSH, T4TOTAL, FREET4, T3FREE, THYROIDAB in the last 72 hours. Lipid Profile: No results for input(s): CHOL, HDL, LDLCALC, TRIG, CHOLHDL, LDLDIRECT in the last 72 hours. Anemia Panel: No results for input(s): VITAMINB12, FOLATE, FERRITIN, TIBC, IRON, RETICCTPCT in the last 72 hours. Urine analysis: No results found for: COLORURINE, APPEARANCEUR, LABSPEC, PHURINE, GLUCOSEU, HGBUR, BILIRUBINUR, KETONESUR, PROTEINUR, UROBILINOGEN, NITRITE, LEUKOCYTESUR Sepsis Labs: Invalid input(s): PROCALCITONIN, [place]  Microbiology: Recent Results (from the past 240 hour(s))  Resp Panel by RT-PCR (Flu A&B, Covid) Nasopharyngeal Swab     Status: None   Collection Time: 10/10/21  2:16 PM   Specimen: Nasopharyngeal Swab; Nasopharyngeal(NP) swabs in vial transport medium  Result Value Ref Range Status   SARS Coronavirus 2 by RT PCR NEGATIVE NEGATIVE Final    Comment: (NOTE) SARS-CoV-2 target nucleic acids are NOT DETECTED.  The SARS-CoV-2 RNA is generally detectable in upper respiratory specimens during  the acute phase of infection. The lowest concentration of SARS-CoV-2 viral copies this assay can detect is 138 copies/mL. A negative result does not preclude SARS-Cov-2 infection and should not be used as the sole basis for treatment or other patient management decisions. A negative result may occur with  improper specimen collection/handling, submission of specimen other than nasopharyngeal swab, presence of viral mutation(s) within the areas targeted by this assay, and inadequate number of viral copies(<138 copies/mL). A negative result must be combined with clinical observations, patient history, and epidemiological information. The expected result is Negative.  Fact Sheet for Patients:  EntrepreneurPulse.com.au  Fact Sheet for Healthcare Providers:   IncredibleEmployment.be  This test is no t yet approved or cleared by the [place] FDA and  has been authorized for detection and/or diagnosis of SARS-CoV-2 by FDA under an Emergency Use Authorization (EUA). This EUA will remain  in effect (meaning this test can be used) for the duration of the COVID-19 declaration under Section 564(b)(1) of the Act, 21 U.S.C.section 360bbb-3(b)(1), unless the authorization is terminated  or revoked sooner.       Influenza A by PCR NEGATIVE NEGATIVE Final   Influenza B by PCR NEGATIVE NEGATIVE Final    Comment: (NOTE) The Xpert Xpress SARS-CoV-2/FLU/RSV plus assay is intended as an aid in the diagnosis of influenza from Nasopharyngeal swab specimens and should not be used as a sole basis for treatment. Nasal washings and aspirates are unacceptable for Xpert Xpress SARS-CoV-2/FLU/RSV testing.  Fact Sheet for Patients: EntrepreneurPulse.com.au  Fact Sheet for Healthcare Providers: IncredibleEmployment.be  This test is not yet approved or cleared by the [place] FDA and has been authorized for detection and/or diagnosis of SARS-CoV-2 by FDA under an Emergency Use Authorization (EUA). This EUA will remain in effect (meaning this test can be used) for the duration of the COVID-19 declaration under Section 564(b)(1) of the Act, 21 U.S.C. section 360bbb-3(b)(1), unless the authorization is terminated or revoked.  Performed at [facility], [place] [address], [place], [place] [address]   Surgical pcr screen     Status: None   Collection Time: 10/11/21  6:36 AM   Specimen: Nasal Mucosa; Nasal Swab  Result Value Ref Range Status   MRSA, PCR NEGATIVE NEGATIVE Final   Staphylococcus aureus NEGATIVE NEGATIVE Final    Comment: (NOTE) The Xpert SA Assay (FDA approved for NASAL specimens in patients 89 years of age and older), is one component of a comprehensive surveillance program.  It is not intended to diagnose infection nor to guide or monitor treatment. Performed at [facility], [place] [address], [place], [place] [address]     Radiology Studies: DG Pelvis Portable  Result Date: 10/10/2021 CLINICAL DATA:  RIGHT knee pain post fall EXAM: PORTABLE PELVIS 1-2 VIEWS COMPARISON:  Portable exam 1415 hours compared to 11/24/2020 FINDINGS: Prior L5-S1 fusion. RIGHT femoral prosthesis. Osseous demineralization diffusely. No fracture, dislocation, or bone destruction. Scattered pelvic surgical clips identified. IMPRESSION: RIGHT hip prosthesis and prior L5-S1 fusion. Osseous demineralization without acute bony findings. Electronically Signed   By: Lavonia Dana M.D.   On: 10/10/2021 14:50   DG FEMUR PORT MIN 2 VIEWS LEFT  Result Date: 10/10/2021 CLINICAL DATA:  RIGHT knee pain post fall EXAM: LEFT FEMUR PORTABLE 2 VIEWS COMPARISON:  07/14/2019 RIGHT knee radiographs FINDINGS: Long RIGHT femoral prosthesis extending from hip joint to distal RIGHT femoral metaphysis. Comminuted periprosthetic fracture identified at distal RIGHT femoral diaphysis, displaced and angulated. Hip and knee joint alignments  normal. Visualized pelvis intact. Osseous demineralization. IMPRESSION: Long RIGHT femoral prosthesis extending from femoral head to distal RIGHT femoral metaphysis. Comminuted angulated and displaced periprosthetic fracture at distal RIGHT femoral diaphysis. Osseous demineralization. Electronically Signed   By: Lavonia Dana M.D.   On: 10/10/2021 14:49      Sarabelle Genson T. [place]  If 7PM-7AM, please contact night-coverage [email or web address] 10/11/2021, 1:17 PM

## 2021-10-12 DIAGNOSIS — M459 Ankylosing spondylitis of unspecified sites in spine: Secondary | ICD-10-CM | POA: Diagnosis not present

## 2021-10-12 DIAGNOSIS — I2510 Atherosclerotic heart disease of native coronary artery without angina pectoris: Secondary | ICD-10-CM | POA: Diagnosis not present

## 2021-10-12 DIAGNOSIS — G9340 Encephalopathy, unspecified: Secondary | ICD-10-CM | POA: Diagnosis not present

## 2021-10-12 DIAGNOSIS — M978XXA Periprosthetic fracture around other internal prosthetic joint, initial encounter: Secondary | ICD-10-CM | POA: Diagnosis not present

## 2021-10-12 LAB — RPR: RPR Ser Ql: NONREACTIVE

## 2021-10-12 LAB — RENAL FUNCTION PANEL
Albumin: 2.9 g/dL — ABNORMAL LOW (ref 3.5–5.0)
Anion gap: 8 (ref 5–15)
BUN: 13 mg/dL (ref 8–23)
CO2: 23 mmol/L (ref 22–32)
Calcium: 8.1 mg/dL — ABNORMAL LOW (ref 8.9–10.3)
Chloride: 107 mmol/L (ref 98–111)
Creatinine, Ser: 1.25 mg/dL — ABNORMAL HIGH (ref 0.61–1.24)
GFR, Estimated: >60 mL/min (ref >60)
Glucose, Bld: 121 mg/dL — ABNORMAL HIGH (ref 70–99)
Phosphorus: 2.5 mg/dL (ref 2.5–4.6)
Potassium: 3.9 mmol/L (ref 3.5–5.1)
Sodium: 138 mmol/L (ref 135–145)

## 2021-10-12 LAB — CBC
HCT: 40.4 % (ref 39.0–52.0)
Hemoglobin: 12.8 g/dL — ABNORMAL LOW (ref 13.0–17.0)
MCH: 25.8 pg — ABNORMAL LOW (ref 26.0–34.0)
MCHC: 31.7 g/dL (ref 30.0–36.0)
MCV: 81.5 fL (ref 80.0–100.0)
Platelets: 208 10*3/uL (ref 150–400)
RBC: 4.96 MIL/uL (ref 4.22–5.81)
RDW: 17.2 % — ABNORMAL HIGH (ref 11.5–15.5)
WBC: 11.1 10*3/uL — ABNORMAL HIGH (ref 4.0–10.5)
nRBC: 0 % (ref 0.0–0.2)

## 2021-10-12 LAB — MAGNESIUM: Magnesium: 2 mg/dL (ref 1.7–2.4)

## 2021-10-12 MED ORDER — KCL IN DEXTROSE-NACL 20-5-0.9 MEQ/L-%-% IV SOLN: 75.0000 mL/h | INTRAVENOUS | Status: DC

## 2021-10-12 MED ORDER — HYDROMORPHONE HCL 1 MG/ML IJ SOLN: 1.0000 mg | INTRAMUSCULAR | 0 refills | Status: DC | PRN

## 2021-10-12 MED ORDER — LABETALOL HCL 5 MG/ML IV SOLN: 10.0000 mg | INTRAVENOUS | Status: DC | PRN

## 2021-10-12 MED ORDER — METHOCARBAMOL 1000 MG/10ML IJ SOLN: 750.0000 mg | Freq: Four times a day (QID) | INTRAVENOUS | Status: DC | PRN

## 2021-10-12 MED ORDER — OXYCODONE HCL 5 MG PO TABS
10.0000 mg | ORAL_TABLET | Freq: Four times a day (QID) | ORAL | Status: DC | PRN
  Administered 2021-10-13: 10 mg via ORAL
  Filled 2021-10-12: qty 2

## 2021-10-12 MED ORDER — HEPARIN SODIUM (PORCINE) 5000 UNIT/ML IJ SOLN: 5000.0000 [IU] | Freq: Three times a day (TID) | INTRAMUSCULAR | Status: DC

## 2021-10-12 MED ORDER — METHOCARBAMOL 1000 MG/10ML IJ SOLN
750.0000 mg | Freq: Four times a day (QID) | INTRAVENOUS | Status: DC | PRN
  Administered 2021-10-12 – 2021-10-13 (×3): 750 mg via INTRAVENOUS
  Filled 2021-10-12 (×6): qty 7.5

## 2021-10-12 MED ORDER — OXYCODONE HCL 10 MG PO TABS: 10.0000 mg | ORAL_TABLET | Freq: Four times a day (QID) | ORAL | 0 refills | Status: DC | PRN

## 2021-10-12 NOTE — Plan of Care (Signed)

## 2021-10-12 NOTE — Discharge Summary (Signed)
Physician Discharge Summary  Bruce Ball YHC:[id number] DOB: 1946-09-20 DOA: 10/10/2021  PCP: [name], MD  Admit date: 10/10/2021 Discharge date: 10/12/2021 Admitted From: Home.  Uses walker and cane at baseline.  Lives with his wife. Disposition: [facility]  Discharge Condition: Stable CODE STATUS: Full code  Hospital Course: 75 year old M with PMH of PE, CKD-3, diastolic CHF, osteosarcoma s/p surgical resection, CAD, ankylosing spondylitis, lumbar sacral radiculopathy, recurrent falls, prostate cancer, HTN and GERD brought to ED after fall at home.  Reportedly struck his right hip and right thigh.  Unknown mechanism of fall.  Reportedly, patient was by himself at home.  Patient's wife was out of town.  Per patient's wife, he might have tripped and fell in the garage after returning from bank.  He does not think he had his walker or cane when he fell.  X-ray showed right periprosthetic fracture of right femur.  Orthopedic surgery consulted and recommended transfer to [facility].  Patient has been accepted by orthopedic surgeon, Dr. Allayne Stack  at [facility] pending bed availability.   See individual problem list below for more on hospital course.  Discharge Diagnoses:  Unwitnessed fall at home Periprosthetic fracture of shaft of femur -[facility] orthopedic surgery recommended transfer to [place] given history. -Dr. Allayne Stack at [facility] accepted patient pending bed availability. -Called transfer center on 10/13.  Patient is #1 and priority on transfer list for orthopedic service pending bed availability -Complete bedrest with traction weight to RLE. -Pain control with home Cymbalta and gabapentin, and as needed Dilaudid and Robaxin -N.p.o. except sips with meds pending SLP eval -Change LR to D5-NS-KCl while NPO.  Acute encephalopathy: Likely iatrogenic from pain medications.  No focal neurodeficit.  He is awake and oriented to self.  Work-up including CT head, VBG, ammonia, TSH, B12,  UDS and RPR unrevealing. -Reorientation and delirium precautions.  History of pulmonary embolism: On prophylactic dose Eliquis at home. -Hold home Eliquis -Subcu heparin -Monitor H&H   HTN (hypertension): BP slightly elevated likely due to pain and IV fluid. -Pain management as above. -Resume home Coreg if able to take p.o. safely -As needed labetalol   Chronic diastolic CHF (congestive heart failure) ([place]): No cardiopulmonary symptoms.  Appears euvolemic on exam.   -Monitor fluid status while on IV fluid   GERD (gastroesophageal reflux disease) -Continue PPI   History of ankylosing spondylitis/lumbosacral radiculopathy-chronic -Resume home Cymbalta and gabapentin. -Robaxin and Dilaudid as above   CKD-3A: Stable. Recent Labs    10/10/21 1400 10/11/21 0645 10/12/21 0734  BUN 16 18 13   CREATININE 1.49* 1.50* 1.25*     Hyperlipidemia -Continue statin   History of CAD: No anginal symptoms. -Continue home meds   Ambulatory dysfunction -Currently strict bedrest   Class I obesity Body mass index is 33.19 kg/m.           Discharge Exam: Vitals:   10/12/21 0321 10/12/21 0321 10/12/21 0356 10/12/21 0836  BP: (!) 175/81 (!) 175/81 (!) 151/74 (!) 180/76  Pulse: 73 75  71  Temp: 98.5 F (36.9 C) 98.5 F (36.9 C)  98.4 F (36.9 C)  Resp:    17  Height:      Weight:      SpO2: 98% 98%  99%  TempSrc: Oral Oral  Oral  BMI (Calculated):         GENERAL: Seems to be uncomfortable.  Moving intermittently. HEENT: MMM.  Vision and hearing grossly intact.  NECK: Supple.  No apparent JVD.  RESP:  99% on RA.  No IWOB.  Fair aeration bilaterally. CVS:  RRR. Heart sounds normal.  ABD/GI/GU: BS+. Abd soft, NTND.  MSK/EXT:  Moves extremities. No apparent deformity. No edema.  SKIN: no apparent skin lesion or wound NEURO: Awake.  Not answering orientation questions.moves upper extremities.  No apparent focal neuro deficit but limited exam due to mental status. PSYCH:  Somewhat uncomfortable, and moving intermittently.  Discharge Instructions   Allergies as of 10/12/2021   Not on File      Medication List     TAKE these medications    apixaban 5 MG Tabs tablet Commonly known as: ELIQUIS Take 2.5 mg by mouth 2 (two) times daily.   atorvastatin 20 MG tablet Commonly known as: LIPITOR Take 20 mg by mouth at bedtime.   capsaicin 0.025 % cream Commonly known as: ZOSTRIX Apply 1 application topically 2 (two) times daily as needed (joint pain except hands).   carvedilol 25 MG tablet Commonly known as: COREG Take 25 mg by mouth See admin instructions. Twice daily. **do not take if systolic blood pressure is less than 110 or if heart rate is less than 55 beats per minute**   dextrose 5 % and 0.9 % NaCl with KCl 20 mEq/L 20-5-0.9 MEQ/L-%-% Inject 75 mL/hr into the vein continuous.   DULoxetine 30 MG capsule Commonly known as: CYMBALTA Take 30 mg by mouth daily.   ferrous sulfate 325 (65 FE) MG tablet Take 325 mg by mouth every Monday, Wednesday, and Friday.   gabapentin 300 MG capsule Commonly known as: NEURONTIN Take 300 mg by mouth at bedtime.   heparin 5000 UNIT/ML injection Inject 1 mL (5,000 Units total) into the skin every 8 (eight) hours.   HYDROmorphone 1 MG/ML injection Commonly known as: DILAUDID Inject 1 mL (1 mg total) into the vein every 2 (two) hours as needed for severe pain.   indapamide 2.5 MG tablet Commonly known as: LOZOL Take 1.25 mg by mouth daily.   labetalol 5 MG/ML injection Commonly known as: NORMODYNE Inject 2 mLs (10 mg total) into the vein every 2 (two) hours as needed (SBP above 160 or DBP above 100).   methocarbamol 750 mg in dextrose 5 % 50 mL Inject 750 mg into the vein every 6 (six) hours as needed.   multivitamin with minerals tablet Take 1 tablet by mouth daily.   Oxycodone HCl 10 MG Tabs Take 1 tablet (10 mg total) by mouth every 6 (six) hours as needed for moderate pain.   pantoprazole  40 MG tablet Commonly known as: PROTONIX Take 40 mg by mouth 2 (two) times daily before a meal.   SYSTANE BALANCE OP Place 1 drop into both eyes in the morning and at bedtime.   Vitamin D3 50 MCG (2000 UT) Tabs Take 2,000 Units by mouth 2 (two) times daily with a meal.         Consultations: Orthopedic surgery  Procedures/Studies:   CT HEAD WO CONTRAST (5MM)  Result Date: 10/11/2021 CLINICAL DATA:  Mental status change, unknown cause EXAM: CT HEAD WITHOUT CONTRAST TECHNIQUE: Contiguous axial images were obtained from the base of the skull through the vertex without intravenous contrast. COMPARISON:  08/23/2021 FINDINGS: Brain: No acute infarct, hemorrhage, mass, mass effect, or midline shift. Mildly advanced cerebral volume loss for age. Periventricular white matter changes, likely the sequela of chronic small vessel ischemic disease. No extra-axial collection. Vascular: Atherosclerotic calcifications in the intracranial carotid and vertebral arteries. No hyperdense vessel Skull: Normal. Negative for  fracture or focal lesion. Sinuses/Orbits: Mucosal thickening throughout the paranasal sinuses. Status post bilateral lens replacements. Other: The mastoids are well aerated. IMPRESSION: No acute intracranial process. Electronically Signed   By: Merilyn Baba M.D.   On: 10/11/2021 16:27   DG Pelvis Portable  Result Date: 10/10/2021 CLINICAL DATA:  RIGHT knee pain post fall EXAM: PORTABLE PELVIS 1-2 VIEWS COMPARISON:  Portable exam 1415 hours compared to 11/24/2020 FINDINGS: Prior L5-S1 fusion. RIGHT femoral prosthesis. Osseous demineralization diffusely. No fracture, dislocation, or bone destruction. Scattered pelvic surgical clips identified. IMPRESSION: RIGHT hip prosthesis and prior L5-S1 fusion. Osseous demineralization without acute bony findings. Electronically Signed   By: Lavonia Dana M.D.   On: 10/10/2021 14:50   DG FEMUR PORT MIN 2 VIEWS LEFT  Result Date: 10/10/2021 CLINICAL  DATA:  RIGHT knee pain post fall EXAM: LEFT FEMUR PORTABLE 2 VIEWS COMPARISON:  07/14/2019 RIGHT knee radiographs FINDINGS: Long RIGHT femoral prosthesis extending from hip joint to distal RIGHT femoral metaphysis. Comminuted periprosthetic fracture identified at distal RIGHT femoral diaphysis, displaced and angulated. Hip and knee joint alignments normal. Visualized pelvis intact. Osseous demineralization. IMPRESSION: Long RIGHT femoral prosthesis extending from femoral head to distal RIGHT femoral metaphysis. Comminuted angulated and displaced periprosthetic fracture at distal RIGHT femoral diaphysis. Osseous demineralization. Electronically Signed   By: Lavonia Dana M.D.   On: 10/10/2021 14:49       The results of significant diagnostics from this hospitalization (including imaging, microbiology, ancillary and laboratory) are listed below for reference.     Microbiology: Recent Results (from the past 240 hour(s))  Resp Panel by RT-PCR (Flu A&B, Covid) Nasopharyngeal Swab     Status: None   Collection Time: 10/10/21  2:16 PM   Specimen: Nasopharyngeal Swab; Nasopharyngeal(NP) swabs in vial transport medium  Result Value Ref Range Status   SARS Coronavirus 2 by RT PCR NEGATIVE NEGATIVE Final    Comment: (NOTE) SARS-CoV-2 target nucleic acids are NOT DETECTED.  The SARS-CoV-2 RNA is generally detectable in upper respiratory specimens during the acute phase of infection. The lowest concentration of SARS-CoV-2 viral copies this assay can detect is 138 copies/mL. A negative result does not preclude SARS-Cov-2 infection and should not be used as the sole basis for treatment or other patient management decisions. A negative result may occur with  improper specimen collection/handling, submission of specimen other than nasopharyngeal swab, presence of viral mutation(s) within the areas targeted by this assay, and inadequate number of viral copies(<138 copies/mL). A negative result must be  combined with clinical observations, patient history, and epidemiological information. The expected result is Negative.  Fact Sheet for Patients:  EntrepreneurPulse.com.au  Fact Sheet for Healthcare Providers:  IncredibleEmployment.be  This test is no t yet approved or cleared by the [place] FDA and  has been authorized for detection and/or diagnosis of SARS-CoV-2 by FDA under an Emergency Use Authorization (EUA). This EUA will remain  in effect (meaning this test can be used) for the duration of the COVID-19 declaration under Section 564(b)(1) of the Act, 21 U.S.C.section 360bbb-3(b)(1), unless the authorization is terminated  or revoked sooner.       Influenza A by PCR NEGATIVE NEGATIVE Final   Influenza B by PCR NEGATIVE NEGATIVE Final    Comment: (NOTE) The Xpert Xpress SARS-CoV-2/FLU/RSV plus assay is intended as an aid in the diagnosis of influenza from Nasopharyngeal swab specimens and should not be used as a sole basis for treatment. Nasal washings and aspirates are unacceptable for Xpert Xpress SARS-CoV-2/FLU/RSV testing.  Fact Sheet for Patients: EntrepreneurPulse.com.au  Fact Sheet for Healthcare Providers: IncredibleEmployment.be  This test is not yet approved or cleared by the [place] FDA and has been authorized for detection and/or diagnosis of SARS-CoV-2 by FDA under an Emergency Use Authorization (EUA). This EUA will remain in effect (meaning this test can be used) for the duration of the COVID-19 declaration under Section 564(b)(1) of the Act, 21 U.S.C. section 360bbb-3(b)(1), unless the authorization is terminated or revoked.  Performed at [facility], [place] [address] [place], [place] [address]   Surgical pcr screen     Status: None   Collection Time: 10/11/21  6:36 AM   Specimen: Nasal Mucosa; Nasal Swab  Result Value Ref Range Status   MRSA, PCR NEGATIVE  NEGATIVE Final   Staphylococcus aureus NEGATIVE NEGATIVE Final    Comment: (NOTE) The Xpert SA Assay (FDA approved for NASAL specimens in patients 78 years of age and older), is one component of a comprehensive surveillance program. It is not intended to diagnose infection nor to guide or monitor treatment. Performed at [facility], [place] [address] [place], [place] [address]      Labs:  CBC: Recent Labs  Lab 10/10/21 1400 10/11/21 0645 10/12/21 0734  WBC 6.7 8.7 11.1*  NEUTROABS 4.2 6.0  --   HGB 13.3 12.7* 12.8*  HCT 41.4 41.3 40.4  MCV 80.1 81.8 81.5  PLT 207 206 208   BMP &GFR Recent Labs  Lab 10/10/21 1400 10/11/21 0645 10/12/21 0734  NA 137 137 138  K 3.8 4.0 3.9  CL 107 106 107  CO2 22 23 23   GLUCOSE 133* 117* 121*  BUN 16 18 13   CREATININE 1.49* 1.50* 1.25*  CALCIUM 8.6* 8.3* 8.1*  MG  --  2.1 2.0  PHOS  --   --  2.5   Estimated Creatinine Clearance: 63.8 mL/min (A) (by C-G formula based on SCr of 1.25 mg/dL (H)). Liver & Pancreas: Recent Labs  Lab 10/11/21 0645 10/12/21 0734  AST 25  --   ALT 18  --   ALKPHOS 74  --   BILITOT 0.8  --   PROT 5.4*  --   ALBUMIN 2.9* 2.9*   No results for input(s): LIPASE, AMYLASE in the last 168 hours. Recent Labs  Lab 10/11/21 1500  AMMONIA 29   Diabetic: No results for input(s): HGBA1C in the last 72 hours. No results for input(s): GLUCAP in the last 168 hours. Cardiac Enzymes: No results for input(s): CKTOTAL, CKMB, CKMBINDEX, TROPONINI in the last 168 hours. No results for input(s): PROBNP in the last 8760 hours. Coagulation Profile: No results for input(s): INR, PROTIME in the last 168 hours. Thyroid Function Tests: Recent Labs    10/11/21 1500  TSH 1.806   Lipid Profile: No results for input(s): CHOL, HDL, LDLCALC, TRIG, CHOLHDL, LDLDIRECT in the last 72 hours. Anemia Panel: Recent Labs    10/11/21 1500  VITAMINB12 278   Urine analysis: No results found for: COLORURINE,  APPEARANCEUR, LABSPEC, PHURINE, GLUCOSEU, HGBUR, BILIRUBINUR, KETONESUR, PROTEINUR, UROBILINOGEN, NITRITE, LEUKOCYTESUR Sepsis Labs: Invalid input(s): PROCALCITONIN, LACTICIDVEN   Time coordinating discharge: 55 minutes  SIGNED:  Mercy Riding, MD  [facility] 10/12/2021, 2:44 PM

## 2021-10-13 DIAGNOSIS — S7292XA Unspecified fracture of left femur, initial encounter for closed fracture: Secondary | ICD-10-CM | POA: Diagnosis not present

## 2021-10-13 DIAGNOSIS — W19XXXA Unspecified fall, initial encounter: Secondary | ICD-10-CM | POA: Diagnosis not present

## 2021-10-13 DIAGNOSIS — M459 Ankylosing spondylitis of unspecified sites in spine: Secondary | ICD-10-CM | POA: Diagnosis not present

## 2021-10-13 DIAGNOSIS — I2510 Atherosclerotic heart disease of native coronary artery without angina pectoris: Secondary | ICD-10-CM | POA: Diagnosis not present

## 2021-10-13 MED ORDER — LIDOCAINE 5 % EX PTCH
1.0000 | MEDICATED_PATCH | CUTANEOUS | Status: DC
  Administered 2021-10-13: 1 via TRANSDERMAL
  Filled 2021-10-13: qty 1

## 2021-10-13 MED ORDER — ACETAMINOPHEN 325 MG PO TABS
650.0000 mg | ORAL_TABLET | Freq: Four times a day (QID) | ORAL | Status: DC
  Administered 2021-10-13 (×2): 650 mg via ORAL
  Filled 2021-10-13 (×2): qty 2

## 2021-10-13 MED ORDER — PANTOPRAZOLE SODIUM 40 MG IV SOLR
40.0000 mg | INTRAVENOUS | Status: DC
  Administered 2021-10-13: 40 mg via INTRAVENOUS
  Filled 2021-10-13: qty 40

## 2021-10-13 MED ORDER — GABAPENTIN 100 MG PO CAPS
100.0000 mg | ORAL_CAPSULE | Freq: Three times a day (TID) | ORAL | Status: DC
  Administered 2021-10-13 (×2): 100 mg via ORAL
  Filled 2021-10-13 (×3): qty 1

## 2021-10-13 MED ORDER — SIMETHICONE 40 MG/0.6ML PO SUSP
40.0000 mg | Freq: Two times a day (BID) | ORAL | Status: DC
  Administered 2021-10-13: 40 mg via ORAL
  Filled 2021-10-13 (×3): qty 0.6

## 2021-10-13 MED ORDER — SUCRALFATE 1 GM/10ML PO SUSP
1.0000 g | Freq: Three times a day (TID) | ORAL | Status: DC
  Administered 2021-10-13 (×2): 1 g via ORAL
  Filled 2021-10-13 (×2): qty 10

## 2021-10-13 MED ORDER — PANTOPRAZOLE SODIUM 40 MG IV SOLR
40.0000 mg | Freq: Two times a day (BID) | INTRAVENOUS | Status: DC
  Administered 2021-10-13: 40 mg via INTRAVENOUS
  Filled 2021-10-13: qty 40

## 2021-10-13 MED ORDER — CYANOCOBALAMIN 1000 MCG/ML IJ SOLN
1000.0000 ug | Freq: Once | INTRAMUSCULAR | Status: DC
  Filled 2021-10-13: qty 1

## 2021-10-13 NOTE — Progress Notes (Signed)
New orders received to remove patient from bucks traction and place patient in knee immobilizer for transport to [facility]. Ortho tech to removed traction.

## 2021-10-13 NOTE — Progress Notes (Signed)
PROGRESS NOTE  Bruce Ball:[id number] DOB: June 16, 1946   PCP: [name], MD  Patient is from: Home.  Lives with his wife.  Uses walker at baseline.  DOA: 10/10/2021 LOS: 3  Chief complaints:  Chief Complaint  Patient presents with   Fall     Brief Narrative / Interim history: 75 year old M with PMH of PE, CKD-3, diastolic CHF, osteosarcoma s/p surgical resection, CAD, ankylosing spondylitis, lumbar sacral radiculopathy, recurrent falls, prostate cancer, HTN and GERD brought to ED after fall at home.  Reportedly struck his right hip and right thigh.  Unknown mechanism of fall.  Reportedly, patient was by himself at home.  Patient's wife was out of town.  Per patient's wife, he might have tripped and fell in the garage after returning from bank.  He does not think he had his walker or cane when he fell.  X-ray showed right periprosthetic fracture of right femur.  Orthopedic surgery consulted and recommended transfer to [facility].  Patient has been accepted by orthopedic surgeon, Dr. Allayne Stack  at [facility] pending bed availability.   See individual problem list below for more on hospital course.  Subjective: Seen and examined earlier this morning.  No major events overnight of this morning.  Remains confused and sleepy but easily wakes to voice.  He is only oriented to self.  Responds yes to pain in his leg. Also concern about indigestion. Responds no to chest pain, dyspnea or abdominal pain.  Intermittently grimacing  Objective: Vitals:   10/12/21 1709 10/12/21 2058 10/13/21 0537 10/13/21 0851  BP: (!) 157/64 (!) 152/67 (!) 147/69 (!) 183/81  Pulse: 71 77 69 73  Resp: 17  20 18   Temp: 98.7 F (37.1 C) 99.5 F (37.5 C) 99 F (37.2 C) 98 F (36.7 C)  TempSrc: Oral Oral  Oral  SpO2: 98% 98% 99%   Weight:      Height:        Intake/Output Summary (Last 24 hours) at 10/13/2021 1540 Last data filed at 10/13/2021 1404 Gross per 24 hour  Intake 962.67 ml  Output 1375 ml   Net -412.33 ml   Filed Weights   10/10/21 1431  Weight: 108 kg    Examination:  GENERAL: Intermittently grimacing. HEENT: MMM.  Vision and hearing grossly intact.  NECK: Supple.  No apparent JVD.  RESP: 99% on RA.  No IWOB.  Fair aeration bilaterally. CVS:  RRR. Heart sounds normal.  ABD/GI/GU: BS+. Abd soft, NTND.  MSK/EXT:  Moves extremities.  Swelling about right knee.  Traction weight in place. SKIN: no apparent skin lesion or wound NEURO: Sleepy but wakes to voice easily.  Oriented to self.  Follows commands.  Grip strength symmetric.  No apparent focal neuro deficit but limited exam due to mental status. PSYCH: Calm except for intermittent grimacing  Procedures:  None  Microbiology summarized: COVID-19 and influenza PCR nonreactive.  Assessment & Plan: Unwitnessed fall at home Periprosthetic fracture of shaft of femur -[facility] orthopedic surgery recommended transfer to [facility] given history. -Dr. Allayne Stack at [facility] accepted patient pending bed availability. -Called [facility] again on 10/15. No bed assigned yet but "will work on it" -Complete bedrest with traction weight to RLE. Ensure appropriate bed level for traction -Tylenol, Cymbalta and gabapentin scheduled -Robaxin, oxycodone and Dilaudid as needed based on pain severity -Continue D5-NS-KCl  -Dysphagia 3 diet   Acute encephalopathy: Likely iatrogenic from pain medications.  No focal neurodeficit.  Sleepy but wakes to voice easily.  Oriented  to self.  Follows some commands.  Work-up including CT head, VBG, ammonia, TSH, B12, UDS and RPR unrevealing. -Reorientation and delirium precautions. -Dysphagia 3 diet per SLP -Aspiration precaution   History of pulmonary embolism: On prophylactic dose Eliquis at home. -Hold home Eliquis -Subcu heparin every 8 hours -Monitor H&H   HTN (hypertension): BP elevated likely from pain and IV fluid. -Pain management as above. -Continue home Coreg -As needed labetalol    Chronic diastolic CHF (congestive heart failure) ([place]): No cardiopulmonary symptoms.  Euvolemic on exam.  99% on RA. -Monitor fluid status while on IV fluid   GERD/indigestion -IV Protonix 40 mg twice daily -Add Carafate and simethicone per family request   History of ankylosing spondylitis/lumbosacral radiculopathy-chronic -Resume home Cymbalta and gabapentin. -Robaxin and Dilaudid as above   CKD-3A: Stable. Recent Labs    10/10/21 1400 10/11/21 0645 10/12/21 0734  BUN 16 18 13   CREATININE 1.49* 1.50* 1.25*   Hyperlipidemia -Continue statin   History of CAD: No anginal symptoms. -Continue home meds   Ambulatory dysfunction -Currently strict bedrest   Class I obesity Body mass index is 33.19 kg/m.         DVT prophylaxis:  heparin injection 5,000 Units Start: 10/11/21 2200 SCDs Start: 10/11/21 2641  Code Status: Full code.  Confirmed with patient's wife at bedside. Family Communication: Updated patient's wife at bedside. Level of care: Med-Surg Status is: Inpatient  Remains inpatient appropriate because: Pending transfer to [facility] for [facility] intervention pending bed availability.  Patient also with altered mental status   Consultants:  Orthopedic surgery   Sch Meds:  Scheduled Meds:  acetaminophen  650 mg Oral Q6H   atorvastatin  20 mg Oral QHS   carvedilol  25 mg Oral BID WC   cholecalciferol  2,000 Units Oral BID WC   DULoxetine  30 mg Oral Daily   gabapentin  100 mg Oral TID   heparin injection (subcutaneous)  5,000 Units Subcutaneous Q8H   lidocaine  1 patch Transdermal Q24H   multivitamin with minerals  1 tablet Oral Daily   pantoprazole (PROTONIX) IV  40 mg Intravenous Q12H   simethicone  40 mg Oral BID   sucralfate  1 g Oral TID WC & HS   Continuous Infusions:  sodium chloride 10 mL/hr at 10/10/21 2302   dextrose 5 % and 0.9 % NaCl with KCl 20 mEq/L 75 mL/hr at 10/13/21 1157   methocarbamol (ROBAXIN) IV 750 mg (10/13/21 0834)    PRN Meds:.HYDROmorphone (DILAUDID) injection, labetalol, methocarbamol (ROBAXIN) IV, ondansetron **OR** ondansetron (ZOFRAN) IV, oxyCODONE  Antimicrobials: Anti-infectives (From admission, onward)    Start     Dose/Rate Route Frequency Ordered Stop   10/11/21 1700  ceFAZolin (ANCEF) IVPB 2g/100 mL premix        2 g 200 mL/hr over 30 Minutes Intravenous To [facility] 10/11/21 0559 10/12/21 1700        I have personally reviewed the following labs and images: CBC: Recent Labs  Lab 10/10/21 1400 10/11/21 0645 10/12/21 0734  WBC 6.7 8.7 11.1*  NEUTROABS 4.2 6.0  --   HGB 13.3 12.7* 12.8*  HCT 41.4 41.3 40.4  MCV 80.1 81.8 81.5  PLT 207 206 208   BMP &GFR Recent Labs  Lab 10/10/21 1400 10/11/21 0645 10/12/21 0734  NA 137 137 138  K 3.8 4.0 3.9  CL 107 106 107  CO2 22 23 23   GLUCOSE 133* 117* 121*  BUN 16 18 13   CREATININE 1.49* 1.50* 1.25*  CALCIUM 8.6* 8.3* 8.1*  MG  --  2.1 2.0  PHOS  --   --  2.5   Estimated Creatinine Clearance: 63.8 mL/min (A) (by C-G formula based on SCr of 1.25 mg/dL (H)). Liver & Pancreas: Recent Labs  Lab 10/11/21 0645 10/12/21 0734  AST 25  --   ALT 18  --   ALKPHOS 74  --   BILITOT 0.8  --   PROT 5.4*  --   ALBUMIN 2.9* 2.9*   No results for input(s): LIPASE, AMYLASE in the last 168 hours. Recent Labs  Lab 10/11/21 1500  AMMONIA 29   Diabetic: No results for input(s): HGBA1C in the last 72 hours. No results for input(s): GLUCAP in the last 168 hours. Cardiac Enzymes: No results for input(s): CKTOTAL, CKMB, CKMBINDEX, TROPONINI in the last 168 hours. No results for input(s): PROBNP in the last 8760 hours. Coagulation Profile: No results for input(s): INR, PROTIME in the last 168 hours. Thyroid Function Tests: Recent Labs    10/11/21 1500  TSH 1.806   Lipid Profile: No results for input(s): CHOL, HDL, LDLCALC, TRIG, CHOLHDL, LDLDIRECT in the last 72 hours. Anemia Panel: Recent Labs    10/11/21 1500   VITAMINB12 278   Urine analysis: No results found for: COLORURINE, APPEARANCEUR, LABSPEC, PHURINE, GLUCOSEU, HGBUR, BILIRUBINUR, KETONESUR, PROTEINUR, UROBILINOGEN, NITRITE, LEUKOCYTESUR Sepsis Labs: Invalid input(s): PROCALCITONIN, [place]  Microbiology: Recent Results (from the past 240 hour(s))  Resp Panel by RT-PCR (Flu A&B, Covid) Nasopharyngeal Swab     Status: None   Collection Time: 10/10/21  2:16 PM   Specimen: Nasopharyngeal Swab; Nasopharyngeal(NP) swabs in vial transport medium  Result Value Ref Range Status   SARS Coronavirus 2 by RT PCR NEGATIVE NEGATIVE Final    Comment: (NOTE) SARS-CoV-2 target nucleic acids are NOT DETECTED.  The SARS-CoV-2 RNA is generally detectable in upper respiratory specimens during the acute phase of infection. The lowest concentration of SARS-CoV-2 viral copies this assay can detect is 138 copies/mL. A negative result does not preclude SARS-Cov-2 infection and should not be used as the sole basis for treatment or other patient management decisions. A negative result may occur with  improper specimen collection/handling, submission of specimen other than nasopharyngeal swab, presence of viral mutation(s) within the areas targeted by this assay, and inadequate number of viral copies(<138 copies/mL). A negative result must be combined with clinical observations, patient history, and epidemiological information. The expected result is Negative.  Fact Sheet for Patients:  EntrepreneurPulse.com.au  Fact Sheet for Healthcare Providers:  IncredibleEmployment.be  This test is no t yet approved or cleared by the [place] FDA and  has been authorized for detection and/or diagnosis of SARS-CoV-2 by FDA under an Emergency Use Authorization (EUA). This EUA will remain  in effect (meaning this test can be used) for the duration of the COVID-19 declaration under Section 564(b)(1) of the Act,  21 U.S.C.section 360bbb-3(b)(1), unless the authorization is terminated  or revoked sooner.       Influenza A by PCR NEGATIVE NEGATIVE Final   Influenza B by PCR NEGATIVE NEGATIVE Final    Comment: (NOTE) The Xpert Xpress SARS-CoV-2/FLU/RSV plus assay is intended as an aid in the diagnosis of influenza from Nasopharyngeal swab specimens and should not be used as a sole basis for treatment. Nasal washings and aspirates are unacceptable for Xpert Xpress SARS-CoV-2/FLU/RSV testing.  Fact Sheet for Patients: EntrepreneurPulse.com.au  Fact Sheet for Healthcare Providers: IncredibleEmployment.be  This test is not yet approved or cleared by the  [place] Architectural technologist and has been authorized for detection and/or diagnosis of SARS-CoV-2 by FDA under an Print production planner (EUA). This EUA will remain in effect (meaning this test can be used) for the duration of the COVID-19 declaration under Section 564(b)(1) of the Act, 21 U.S.C. section 360bbb-3(b)(1), unless the authorization is terminated or revoked.  Performed at [facility], [place] [address] [place], [place] [address]   Surgical pcr screen     Status: None   Collection Time: 10/11/21  6:36 AM   Specimen: Nasal Mucosa; Nasal Swab  Result Value Ref Range Status   MRSA, PCR NEGATIVE NEGATIVE Final   Staphylococcus aureus NEGATIVE NEGATIVE Final    Comment: (NOTE) The Xpert SA Assay (FDA approved for NASAL specimens in patients 19 years of age and older), is one component of a comprehensive surveillance program. It is not intended to diagnose infection nor to guide or monitor treatment. Performed at [facility], [place] [address], [place], [place] [address]     Radiology Studies: No results found.    Mailani Degroote T. [place]  If 7PM-7AM, please contact night-coverage [email or web address] 10/13/2021, 3:40 PM

## 2021-10-13 NOTE — Progress Notes (Signed)
Patient has a bed at [facility]. [address] room [address], [place] cental tower. [phone]. Care link contacted at this time to transport patient. Patient on schedule to be picked up and transferred to [facility]. Dr. Deanne Coffer attending accepting patient at [facility] Will continue to monitor at this time.

## 2021-10-13 NOTE — Progress Notes (Signed)
[facility] here at this time to transfer patient. Orthopedic tech contacted to remove patient from [facility] per order. Patient denies any pain at this time and refuses pain med prior to transfer. Last pain med administration per [facility].

## 2021-10-13 NOTE — Evaluation (Signed)
Clinical/Bedside Swallow Evaluation Patient Details  Name: Bruce Ball MRN: [id number] Date of Birth: 08-08-1946  Today's Date: 10/13/2021 Time: SLP Start Time (ACUTE ONLY): 0940 SLP Stop Time (ACUTE ONLY): 1012 SLP Time Calculation (min) (ACUTE ONLY): 32 min  Past Medical History: History reviewed. No pertinent past medical history. Past Surgical History: History reviewed. No pertinent surgical history. HPI:  75 year old M with PMH of PE, CKD-3, diastolic CHF, osteosarcoma s/p surgical resection, CAD, ankylosing spondylitis, lumbar sacral radiculopathy, recurrent falls, prostate cancer, HTN and GERD brought to ED after fall at home.  Reportedly struck his right hip and right thigh.  Unknown mechanism of fall.  Reportedly, patient was by himself at home.  Patient's wife was out of town.  Per patient's wife, he might have tripped and fell in the garage after returning from bank.  He does not think he had his walker or cane when he fell.  X-ray showed right periprosthetic fracture of right femur.  Orthopedic surgery consulted and recommended transfer to [facility].  Patient has been accepted by orthopedic surgeon, Dr. Allayne Stack  at [facility] pending bed availability.    Assessment / Plan / Recommendation  Clinical Impression  Patient presents with evidence of a primary cognitive based dysphagia. AMS noted which spouse reports is due to heavy pain medication. Patient able to follow only basic 1 step directions intermittently with max clinician cueing, making needs met via short phrases in less than 25% of opportunitites. RN present, administering pain medication. With mod-max clinician verbal, visual, and tactile cueing, patient was able to consume trials of thin liquids, pureed solids, and regular texture solid without overt indication of aspiration. Oral phase prolonged with solids but functional. Facial grimacing and reports of pain with swallow noted. White lingual coating suggestive of thrush which is  likely also present in larynx/pharynx and contributing to pain. RN made aware. In addition, patient with consistent belching, holding of the chest and on episode of dry heaving c/w history of GERD which spouse reports has also been particularly bad since admission. Combination of deficits does limit ability to efficiently consume adequate pos for nutritional support currently as well as increase risk for aspiration. Swallow is however functional for small amounts of po if/when patient is fully alert and able to sit up and participate. When this is the case, recommend mechanical soft solids, thin liquids with precautions, meds crushed in puree. Suspect that with improved mentation and pain control, swallowing function will return to normal. SLP will f/u briefly to monitor for need for additional diet changes and/or upgrade. SLP Visit Diagnosis: Dysphagia, unspecified (R13.10)    Aspiration Risk       Diet Recommendation Dysphagia 3 (Mech soft);Thin liquid   Liquid Administration via: Straw;Cup Medication Administration: Crushed with puree Supervision: Full supervision/cueing for compensatory strategies;Staff to assist with self feeding Compensations: Slow rate;Small sips/bites;Minimize environmental distractions Postural Changes: Seated upright at 90 degrees;Remain upright for at least 30 minutes after po intake    Other  Recommendations Oral Care Recommendations: Oral care BID    Recommendations for follow up therapy are one component of a multi-disciplinary discharge planning process, led by the attending physician.  Recommendations may be updated based on patient status, additional functional criteria and insurance authorization.  Follow up Recommendations None      Frequency and Duration min 1 x/week  1 week       Prognosis Prognosis for Safe Diet Advancement: Good Barriers/Prognosis Comment: AMS      Swallow Study   General  HPI: 75 year old M with PMH of PE, CKD-3, diastolic CHF,  osteosarcoma s/p surgical resection, CAD, ankylosing spondylitis, lumbar sacral radiculopathy, recurrent falls, prostate cancer, HTN and GERD brought to ED after fall at home.  Reportedly struck his right hip and right thigh.  Unknown mechanism of fall.  Reportedly, patient was by himself at home.  Patient's wife was out of town.  Per patient's wife, he might have tripped and fell in the garage after returning from bank.  He does not think he had his walker or cane when he fell.  X-ray showed right periprosthetic fracture of right femur.  Orthopedic surgery consulted and recommended transfer to [facility].  Patient has been accepted by orthopedic surgeon, Dr. Allayne Stack  at [facility] pending bed availability. Type of Study: Bedside Swallow Evaluation Previous Swallow Assessment: none Diet Prior to this Study: NPO Temperature Spikes Noted: No Respiratory Status: Room air History of Recent Intubation: No Behavior/Cognition: Confused;Lethargic/Drowsy (from pain meds per spouse) Oral Cavity Assessment: Other (comment) (white lingual coating, c/o pain with swallow, ? thrush) Oral Care Completed by SLP: Recent completion by staff Oral Cavity - Dentition: Adequate natural dentition Vision: Functional for self-feeding Self-Feeding Abilities: Total assist (due to AMS) Patient Positioning: Upright in bed (partial, approximately 60% due to hip pain) Baseline Vocal Quality: Normal Volitional Cough: Cognitively unable to elicit Volitional Swallow: Unable to elicit    Oral/Motor/Sensory Function Overall Oral Motor/Sensory Function: Within functional limits   Ice Chips Ice chips: Not tested   Thin Liquid Thin Liquid: Within functional limits Presentation: Straw (for small single sips only)    Nectar Thick Nectar Thick Liquid: Not tested   Honey Thick Honey Thick Liquid: Not tested   Puree Puree: Within functional limits Presentation: Spoon   Solid     Solid: Impaired Oral Phase Functional Implications:  Prolonged oral transit     Tamlyn Sides MA, CCC-SLP  Boruch Manuele Meryl 10/13/2021,10:26 AM

## 2021-10-13 NOTE — Plan of Care (Signed)

## 2021-10-13 NOTE — Progress Notes (Signed)
Orthopedic Tech Progress Note Patient Details:  Bruce Ball November 20, 1946 [id number]  Ortho Devices Type of Ortho Device: Knee Immobilizer Ortho Device/Splint Location: rle. Ortho Device/Splint Interventions: Ordered, Application, Adjustment  Applied for transfer Post Interventions Patient Tolerated: Well Instructions Provided: Care of device, Adjustment of device  Karolee Stamps 10/13/2021, 11:33 PM

## 2021-10-13 NOTE — Progress Notes (Signed)
Called at this time and gave report to Musician at [employer] for patient going to 11[address] room 4 [facility] central tower.

## 2021-10-13 NOTE — Progress Notes (Signed)
Patient transferred out with care link at this time.

## 2021-10-13 NOTE — Progress Notes (Signed)
Patient not being removed from [facility] at this time due to awaiting to receive order from orthopedics.

## 2021-10-13 NOTE — Progress Notes (Signed)
Dr Cyndia Skeeters at bedside, pt somnolent, md updated pt wife. This rn informed md of pt frequent reports of indigestion and nausea with brief relief with protonix and zofran. Pt wife endorses pt hx of chronic indigestion not associated with nausea. When md asked pt if having cp, pt denies cp. Md states pt on heparin sq and has no current indication of pulmonary emboli or dvt and need to start heparin drip at this time

## 2021-10-13 NOTE — Progress Notes (Signed)
Called [facility] bed placement, then [facility] bed placement to verify patient on list for transfer. Gave an updated report on patient to [facility] and was told he was at the top of the list and that 6N should expect a call soon for transfer.

## 2021-10-13 NOTE — Discharge Summary (Signed)
Physician Discharge Summary  DUGAN VANHOESEN XKG:[id number] DOB: 1946-09-08 DOA: 10/10/2021  PCP: [name], MD  Admit date: 10/10/2021 Discharge date: 10/13/2021 Admitted From: Home.  Uses walker and cane at baseline.  Lives with his wife. Disposition: [facility]  Discharge Condition: Stable CODE STATUS: Full code  Hospital Course: 75 year old M with PMH of PE, CKD-3, diastolic CHF, osteosarcoma s/p surgical resection, CAD, ankylosing spondylitis, lumbar sacral radiculopathy, recurrent falls, prostate cancer, HTN and GERD brought to ED after fall at home.  Reportedly struck his right hip and right thigh.  Unknown mechanism of fall.  Reportedly, patient was by himself at home.  Patient's wife was out of town.  Per patient's wife, he might have tripped and fell in the garage after returning from bank.  He does not think he had his walker or cane when he fell.  X-ray showed right periprosthetic fracture of right femur.  Orthopedic surgery consulted and recommended transfer to [facility].  Patient has been accepted by orthopedic surgeon, Dr. Allayne Stack  at [facility] pending bed availability.   See individual problem list below for more on hospital course.  Discharge Diagnoses:  Unwitnessed fall at home Periprosthetic fracture of shaft of femur -[facility]opedic surgery recommended transfer to [place] given history. -Dr. Allayne Stack at [facility] accepted patient pending bed availability. -Called [facility] again on 10/15. No bed assigned yet but "will work on it" -Complete bedrest with traction weight to RLE. Ensure appropriate bed level for traction -Tylenol, Cymbalta and gabapentin scheduled -Robaxin, oxycodone and Dilaudid as needed based on pain severity -Continue D5-NS-KCl  -Dysphagia 3 diet   Acute encephalopathy: Likely iatrogenic from pain medications.  No focal neurodeficit.  Sleepy but wakes to voice easily.  Oriented to self.  Follows some commands.  Work-up including CT head,  VBG, ammonia, TSH, B12, UDS and RPR unrevealing. -Reorientation and delirium precautions. -Dysphagia 3 diet per SLP -Aspiration precaution   History of pulmonary embolism: On prophylactic dose Eliquis at home. -Hold home Eliquis -Subcu heparin every 8 hours -Monitor H&H   HTN (hypertension): BP elevated likely from pain and IV fluid. -Pain management as above. -Continue home Coreg -As needed labetalol   Chronic diastolic CHF (congestive heart failure) ([place]): No cardiopulmonary symptoms.  Euvolemic on exam.  99% on RA. -Monitor fluid status while on IV fluid   GERD/indigestion -IV Protonix 40 mg twice daily -Add Carafate and simethicone per family request   History of ankylosing spondylitis/lumbosacral radiculopathy-chronic -Resume home Cymbalta and gabapentin. -Robaxin and Dilaudid as above   CKD-3A: Stable. Recent Labs (within last 365 days)       Recent Labs    10/10/21 1400 10/11/21 0645 10/12/21 0734  BUN 16 18 13   CREATININE 1.49* 1.50* 1.25*      Hyperlipidemia -Continue statin   History of CAD: No anginal symptoms. -Continue home meds   Ambulatory dysfunction -Currently strict bedrest   Class I obesity Body mass index is  Body mass index is 33.19 kg/m.           Discharge Exam: Vitals:   10/12/21 2058 10/13/21 0537 10/13/21 0851 10/13/21 1655  BP: (!) 152/67 (!) 147/69 (!) 183/81 (!) 157/67  Pulse: 77 69 73 76  Temp: 99.5 F (37.5 C) 99 F (37.2 C) 98 F (36.7 C) 98.2 F (36.8 C)  Resp:  20 18 18   Height:      Weight:      SpO2: 98% 99%  97%  TempSrc: Oral  Oral Oral  BMI (Calculated):        GENERAL: Intermittently grimacing. HEENT: MMM.  Vision and hearing grossly intact.  NECK: Supple.  No apparent JVD.  RESP: 99% on RA.  No IWOB.  Fair aeration bilaterally. CVS:  RRR. Heart sounds normal.  ABD/GI/GU: BS+. Abd soft, NTND.  MSK/EXT:  Moves extremities.  Swelling about right knee.  Traction weight in place. SKIN: no apparent  skin lesion or wound NEURO: Sleepy but wakes to voice easily.  Oriented to self.  Follows commands.  Grip strength symmetric.  No apparent focal neuro deficit but limited exam due to mental status. PSYCH: Calm except for intermittent grimacing    Discharge Instructions   Allergies as of 10/13/2021   No Known Allergies      Medication List     TAKE these medications    apixaban 5 MG Tabs tablet Commonly known as: ELIQUIS Take 2.5 mg by mouth 2 (two) times daily.   atorvastatin 20 MG tablet Commonly known as: LIPITOR Take 20 mg by mouth at bedtime.   capsaicin 0.025 % cream Commonly known as: ZOSTRIX Apply 1 application topically 2 (two) times daily as needed (joint pain except hands).   carvedilol 25 MG tablet Commonly known as: COREG Take 25 mg by mouth See admin instructions. Twice daily. **do not take if systolic blood pressure is less than 110 or if heart rate is less than 55 beats per minute**   dextrose 5 % and 0.9 % NaCl with KCl 20 mEq/L 20-5-0.9 MEQ/L-%-% Inject 75 mL/hr into the vein continuous.   DULoxetine 30 MG capsule Commonly known as: CYMBALTA Take 30 mg by mouth daily.   ferrous sulfate 325 (65 FE) MG tablet Take 325 mg by mouth every Monday, Wednesday, and Friday.   gabapentin 300 MG capsule Commonly known as: NEURONTIN Take 300 mg by mouth at bedtime.   heparin 5000 UNIT/ML injection Inject 1 mL (5,000 Units total) into the skin every 8 (eight) hours.   HYDROmorphone 1 MG/ML injection Commonly known as: DILAUDID Inject 1 mL (1 mg total) into the vein every 2 (two) hours as needed for severe pain.   indapamide 2.5 MG tablet Commonly known as: LOZOL Take 1.25 mg by mouth daily.   labetalol 5 MG/ML injection Commonly known as: NORMODYNE Inject 2 mLs (10 mg total) into the vein every 2 (two) hours as needed (SBP above 160 or DBP above 100).   methocarbamol 750 mg in dextrose 5 % 50 mL Inject 750 mg into the vein every 6 (six) hours as  needed.   multivitamin with minerals tablet Take 1 tablet by mouth daily.   Oxycodone HCl 10 MG Tabs Take 1 tablet (10 mg total) by mouth every 6 (six) hours as needed for moderate pain.   pantoprazole 40 MG tablet Commonly known as: PROTONIX Take 40 mg by mouth 2 (two) times daily before a meal.   SYSTANE BALANCE OP Place 1 drop into both eyes in the morning and at bedtime.   Vitamin D3 50 MCG (2000 UT) Tabs Take 2,000 Units by mouth 2 (two) times daily with a meal.         Consultations: Orthopedic surgery  Procedures/Studies:   CT HEAD WO CONTRAST (5MM)  Result Date: 10/11/2021 CLINICAL DATA:  Mental status change, unknown cause EXAM: CT HEAD WITHOUT CONTRAST TECHNIQUE: Contiguous axial images were obtained from the base of the skull through the vertex without intravenous contrast. COMPARISON:  08/23/2021 FINDINGS: Brain: No acute infarct, hemorrhage, mass, mass  effect, or midline shift. Mildly advanced cerebral volume loss for age. Periventricular white matter changes, likely the sequela of chronic small vessel ischemic disease. No extra-axial collection. Vascular: Atherosclerotic calcifications in the intracranial carotid and vertebral arteries. No hyperdense vessel Skull: Normal. Negative for fracture or focal lesion. Sinuses/Orbits: Mucosal thickening throughout the paranasal sinuses. Status post bilateral lens replacements. Other: The mastoids are well aerated. IMPRESSION: No acute intracranial process. Electronically Signed   By: Merilyn Baba M.D.   On: 10/11/2021 16:27   DG Pelvis Portable  Result Date: 10/10/2021 CLINICAL DATA:  RIGHT knee pain post fall EXAM: PORTABLE PELVIS 1-2 VIEWS COMPARISON:  Portable exam 1415 hours compared to 11/24/2020 FINDINGS: Prior L5-S1 fusion. RIGHT femoral prosthesis. Osseous demineralization diffusely. No fracture, dislocation, or bone destruction. Scattered pelvic surgical clips identified. IMPRESSION: RIGHT hip prosthesis and prior  L5-S1 fusion. Osseous demineralization without acute bony findings. Electronically Signed   By: Lavonia Dana M.D.   On: 10/10/2021 14:50   DG FEMUR PORT MIN 2 VIEWS LEFT  Result Date: 10/10/2021 CLINICAL DATA:  RIGHT knee pain post fall EXAM: LEFT FEMUR PORTABLE 2 VIEWS COMPARISON:  07/14/2019 RIGHT knee radiographs FINDINGS: Long RIGHT femoral prosthesis extending from hip joint to distal RIGHT femoral metaphysis. Comminuted periprosthetic fracture identified at distal RIGHT femoral diaphysis, displaced and angulated. Hip and knee joint alignments normal. Visualized pelvis intact. Osseous demineralization. IMPRESSION: Long RIGHT femoral prosthesis extending from femoral head to distal RIGHT femoral metaphysis. Comminuted angulated and displaced periprosthetic fracture at distal RIGHT femoral diaphysis. Osseous demineralization. Electronically Signed   By: Lavonia Dana M.D.   On: 10/10/2021 14:49       The results of significant diagnostics from this hospitalization (including imaging, microbiology, ancillary and laboratory) are listed below for reference.     Microbiology: Recent Results (from the past 240 hour(s))  Resp Panel by RT-PCR (Flu A&B, Covid) Nasopharyngeal Swab     Status: None   Collection Time: 10/10/21  2:16 PM   Specimen: Nasopharyngeal Swab; Nasopharyngeal(NP) swabs in vial transport medium  Result Value Ref Range Status   SARS Coronavirus 2 by RT PCR NEGATIVE NEGATIVE Final    Comment: (NOTE) SARS-CoV-2 target nucleic acids are NOT DETECTED.  The SARS-CoV-2 RNA is generally detectable in upper respiratory specimens during the acute phase of infection. The lowest concentration of SARS-CoV-2 viral copies this assay can detect is 138 copies/mL. A negative result does not preclude SARS-Cov-2 infection and should not be used as the sole basis for treatment or other patient management decisions. A negative result may occur with  improper specimen collection/handling,  submission of specimen other than nasopharyngeal swab, presence of viral mutation(s) within the areas targeted by this assay, and inadequate number of viral copies(<138 copies/mL). A negative result must be combined with clinical observations, patient history, and epidemiological information. The expected result is Negative.  Fact Sheet for Patients:  EntrepreneurPulse.com.au  Fact Sheet for Healthcare Providers:  IncredibleEmployment.be  This test is no t yet approved or cleared by the [place] FDA and  has been authorized for detection and/or diagnosis of SARS-CoV-2 by FDA under an Emergency Use Authorization (EUA). This EUA will remain  in effect (meaning this test can be used) for the duration of the COVID-19 declaration under Section 564(b)(1) of the Act, 21 U.S.C.section 360bbb-3(b)(1), unless the authorization is terminated  or revoked sooner.       Influenza A by PCR NEGATIVE NEGATIVE Final   Influenza B by PCR NEGATIVE NEGATIVE Final  Comment: (NOTE) The Xpert Xpress SARS-CoV-2/FLU/RSV plus assay is intended as an aid in the diagnosis of influenza from Nasopharyngeal swab specimens and should not be used as a sole basis for treatment. Nasal washings and aspirates are unacceptable for Xpert Xpress SARS-CoV-2/FLU/RSV testing.  Fact Sheet for Patients: EntrepreneurPulse.com.au  Fact Sheet for Healthcare Providers: IncredibleEmployment.be  This test is not yet approved or cleared by the [place] FDA and has been authorized for detection and/or diagnosis of SARS-CoV-2 by FDA under an Emergency Use Authorization (EUA). This EUA will remain in effect (meaning this test can be used) for the duration of the COVID-19 declaration under Section 564(b)(1) of the Act, 21 U.S.C. section 360bbb-3(b)(1), unless the authorization is terminated or revoked.  Performed at [facility], [place] [address], [place], [place] [address]   Surgical pcr screen     Status: None   Collection Time: 10/11/21  6:36 AM   Specimen: Nasal Mucosa; Nasal Swab  Result Value Ref Range Status   MRSA, PCR NEGATIVE NEGATIVE Final   Staphylococcus aureus NEGATIVE NEGATIVE Final    Comment: (NOTE) The Xpert SA Assay (FDA approved for NASAL specimens in patients 25 years of age and older), is one component of a comprehensive surveillance program. It is not intended to diagnose infection nor to guide or monitor treatment. Performed at [facility], [name] [address], [place], [place] [address]      Labs:  CBC: Recent Labs  Lab 10/10/21 1400 10/11/21 0645 10/12/21 0734  WBC 6.7 8.7 11.1*  NEUTROABS 4.2 6.0  --   HGB 13.3 12.7* 12.8*  HCT 41.4 41.3 40.4  MCV 80.1 81.8 81.5  PLT 207 206 208   BMP &GFR Recent Labs  Lab 10/10/21 1400 10/11/21 0645 10/12/21 0734  NA 137 137 138  K 3.8 4.0 3.9  CL 107 106 107  CO2 22 23 23   GLUCOSE 133* 117* 121*  BUN 16 18 13   CREATININE 1.49* 1.50* 1.25*  CALCIUM 8.6* 8.3* 8.1*  MG  --  2.1 2.0  PHOS  --   --  2.5   Estimated Creatinine Clearance: 63.8 mL/min (A) (by C-G formula based on SCr of 1.25 mg/dL (H)). Liver & Pancreas: Recent Labs  Lab 10/11/21 0645 10/12/21 0734  AST 25  --   ALT 18  --   ALKPHOS 74  --   BILITOT 0.8  --   PROT 5.4*  --   ALBUMIN 2.9* 2.9*   No results for input(s): LIPASE, AMYLASE in the last 168 hours. Recent Labs  Lab 10/11/21 1500  AMMONIA 29   Diabetic: No results for input(s): HGBA1C in the last 72 hours. No results for input(s): GLUCAP in the last 168 hours. Cardiac Enzymes: No results for input(s): CKTOTAL, CKMB, CKMBINDEX, TROPONINI in the last 168 hours. No results for input(s): PROBNP in the last 8760 hours. Coagulation Profile: No results for input(s): INR, PROTIME in the last 168 hours. Thyroid Function Tests: Recent Labs    10/11/21 1500  TSH 1.806   Lipid Profile: No results for  input(s): CHOL, HDL, LDLCALC, TRIG, CHOLHDL, LDLDIRECT in the last 72 hours. Anemia Panel: Recent Labs    10/11/21 1500  VITAMINB12 278   Urine analysis: No results found for: COLORURINE, APPEARANCEUR, LABSPEC, PHURINE, GLUCOSEU, HGBUR, BILIRUBINUR, KETONESUR, PROTEINUR, UROBILINOGEN, NITRITE, LEUKOCYTESUR Sepsis Labs: Invalid input(s): PROCALCITONIN, LACTICIDVEN   Time coordinating discharge: 55 minutes  SIGNED:  Mercy Riding, MD  [facility] 10/13/2021, 6:07 PM

## 2021-10-14 DIAGNOSIS — Z600 Problems of adjustment to life-cycle transitions: Secondary | ICD-10-CM | POA: Diagnosis not present

## 2021-10-14 DIAGNOSIS — N183 Chronic kidney disease, stage 3 unspecified: Secondary | ICD-10-CM | POA: Diagnosis present

## 2021-10-14 DIAGNOSIS — I700 Atherosclerosis of aorta: Secondary | ICD-10-CM | POA: Diagnosis present

## 2021-10-14 DIAGNOSIS — R1312 Dysphagia, oropharyngeal phase: Secondary | ICD-10-CM | POA: Diagnosis not present

## 2021-10-14 DIAGNOSIS — R609 Edema, unspecified: Secondary | ICD-10-CM | POA: Diagnosis not present

## 2021-10-14 DIAGNOSIS — M8580 Other specified disorders of bone density and structure, unspecified site: Secondary | ICD-10-CM | POA: Diagnosis present

## 2021-10-14 DIAGNOSIS — K279 Peptic ulcer, site unspecified, unspecified as acute or chronic, without hemorrhage or perforation: Secondary | ICD-10-CM | POA: Diagnosis not present

## 2021-10-14 DIAGNOSIS — E785 Hyperlipidemia, unspecified: Secondary | ICD-10-CM | POA: Diagnosis present

## 2021-10-14 DIAGNOSIS — I10 Essential (primary) hypertension: Secondary | ICD-10-CM | POA: Diagnosis not present

## 2021-10-14 DIAGNOSIS — Z85830 Personal history of malignant neoplasm of bone: Secondary | ICD-10-CM | POA: Diagnosis not present

## 2021-10-14 DIAGNOSIS — D638 Anemia in other chronic diseases classified elsewhere: Secondary | ICD-10-CM | POA: Diagnosis present

## 2021-10-14 DIAGNOSIS — G8911 Acute pain due to trauma: Secondary | ICD-10-CM | POA: Diagnosis not present

## 2021-10-14 DIAGNOSIS — Z7401 Bed confinement status: Secondary | ICD-10-CM | POA: Diagnosis not present

## 2021-10-14 DIAGNOSIS — Z743 Need for continuous supervision: Secondary | ICD-10-CM | POA: Diagnosis not present

## 2021-10-14 DIAGNOSIS — M978XXA Periprosthetic fracture around other internal prosthetic joint, initial encounter: Secondary | ICD-10-CM | POA: Diagnosis not present

## 2021-10-14 DIAGNOSIS — I2699 Other pulmonary embolism without acute cor pulmonale: Secondary | ICD-10-CM | POA: Diagnosis not present

## 2021-10-14 DIAGNOSIS — I130 Hypertensive heart and chronic kidney disease with heart failure and stage 1 through stage 4 chronic kidney disease, or unspecified chronic kidney disease: Secondary | ICD-10-CM | POA: Diagnosis present

## 2021-10-14 DIAGNOSIS — Z7189 Other specified counseling: Secondary | ICD-10-CM | POA: Diagnosis not present

## 2021-10-14 DIAGNOSIS — Z86711 Personal history of pulmonary embolism: Secondary | ICD-10-CM | POA: Diagnosis not present

## 2021-10-14 DIAGNOSIS — N1831 Chronic kidney disease, stage 3a: Secondary | ICD-10-CM | POA: Diagnosis not present

## 2021-10-14 DIAGNOSIS — S72351A Displaced comminuted fracture of shaft of right femur, initial encounter for closed fracture: Secondary | ICD-10-CM | POA: Diagnosis present

## 2021-10-14 DIAGNOSIS — M25461 Effusion, right knee: Secondary | ICD-10-CM | POA: Diagnosis not present

## 2021-10-14 DIAGNOSIS — M9711XA Periprosthetic fracture around internal prosthetic right knee joint, initial encounter: Secondary | ICD-10-CM | POA: Diagnosis not present

## 2021-10-14 DIAGNOSIS — I2510 Atherosclerotic heart disease of native coronary artery without angina pectoris: Secondary | ICD-10-CM | POA: Diagnosis present

## 2021-10-14 DIAGNOSIS — Z8711 Personal history of peptic ulcer disease: Secondary | ICD-10-CM | POA: Diagnosis not present

## 2021-10-14 DIAGNOSIS — Z79899 Other long term (current) drug therapy: Secondary | ICD-10-CM | POA: Diagnosis not present

## 2021-10-14 DIAGNOSIS — Z85828 Personal history of other malignant neoplasm of skin: Secondary | ICD-10-CM | POA: Diagnosis not present

## 2021-10-14 DIAGNOSIS — I5032 Chronic diastolic (congestive) heart failure: Secondary | ICD-10-CM | POA: Diagnosis present

## 2021-10-14 DIAGNOSIS — M6281 Muscle weakness (generalized): Secondary | ICD-10-CM | POA: Diagnosis not present

## 2021-10-14 DIAGNOSIS — R262 Difficulty in walking, not elsewhere classified: Secondary | ICD-10-CM | POA: Diagnosis not present

## 2021-10-14 DIAGNOSIS — S7291XA Unspecified fracture of right femur, initial encounter for closed fracture: Secondary | ICD-10-CM | POA: Diagnosis not present

## 2021-10-14 DIAGNOSIS — Z86718 Personal history of other venous thrombosis and embolism: Secondary | ICD-10-CM | POA: Diagnosis not present

## 2021-10-14 DIAGNOSIS — Z96649 Presence of unspecified artificial hip joint: Secondary | ICD-10-CM | POA: Diagnosis not present

## 2021-10-14 DIAGNOSIS — Z7409 Other reduced mobility: Secondary | ICD-10-CM | POA: Diagnosis not present

## 2021-10-14 DIAGNOSIS — C419 Malignant neoplasm of bone and articular cartilage, unspecified: Secondary | ICD-10-CM | POA: Diagnosis not present

## 2021-10-14 DIAGNOSIS — D62 Acute posthemorrhagic anemia: Secondary | ICD-10-CM | POA: Diagnosis not present

## 2021-10-14 DIAGNOSIS — Z789 Other specified health status: Secondary | ICD-10-CM | POA: Diagnosis not present

## 2021-10-14 DIAGNOSIS — Z96641 Presence of right artificial hip joint: Secondary | ICD-10-CM | POA: Diagnosis not present

## 2021-10-14 DIAGNOSIS — M19011 Primary osteoarthritis, right shoulder: Secondary | ICD-10-CM | POA: Diagnosis not present

## 2021-10-14 DIAGNOSIS — Z87442 Personal history of urinary calculi: Secondary | ICD-10-CM | POA: Diagnosis not present

## 2021-10-14 DIAGNOSIS — M79671 Pain in right foot: Secondary | ICD-10-CM | POA: Diagnosis not present

## 2021-10-14 DIAGNOSIS — M9701XA Periprosthetic fracture around internal prosthetic right hip joint, initial encounter: Secondary | ICD-10-CM | POA: Diagnosis present

## 2021-10-14 DIAGNOSIS — S51011A Laceration without foreign body of right elbow, initial encounter: Secondary | ICD-10-CM | POA: Diagnosis present

## 2021-10-14 DIAGNOSIS — Z20822 Contact with and (suspected) exposure to covid-19: Secondary | ICD-10-CM | POA: Diagnosis present

## 2021-10-14 DIAGNOSIS — M9701XD Periprosthetic fracture around internal prosthetic right hip joint, subsequent encounter: Secondary | ICD-10-CM | POA: Diagnosis not present

## 2021-10-14 DIAGNOSIS — F05 Delirium due to known physiological condition: Secondary | ICD-10-CM | POA: Diagnosis present

## 2021-10-14 DIAGNOSIS — K5900 Constipation, unspecified: Secondary | ICD-10-CM | POA: Diagnosis not present

## 2021-10-14 DIAGNOSIS — M7989 Other specified soft tissue disorders: Secondary | ICD-10-CM | POA: Diagnosis not present

## 2021-10-14 DIAGNOSIS — R5381 Other malaise: Secondary | ICD-10-CM | POA: Diagnosis not present

## 2021-10-14 DIAGNOSIS — K219 Gastro-esophageal reflux disease without esophagitis: Secondary | ICD-10-CM | POA: Diagnosis present

## 2021-10-14 DIAGNOSIS — R296 Repeated falls: Secondary | ICD-10-CM | POA: Diagnosis not present

## 2021-10-14 DIAGNOSIS — W19XXXA Unspecified fall, initial encounter: Secondary | ICD-10-CM | POA: Diagnosis not present

## 2021-10-14 DIAGNOSIS — G8918 Other acute postprocedural pain: Secondary | ICD-10-CM | POA: Diagnosis not present

## 2021-10-14 DIAGNOSIS — Z7901 Long term (current) use of anticoagulants: Secondary | ICD-10-CM | POA: Diagnosis not present

## 2021-10-15 ENCOUNTER — Encounter: Payer: Self-pay | Admitting: Rheumatology

## 2021-10-16 ENCOUNTER — Telehealth: Payer: Self-pay

## 2021-10-16 ENCOUNTER — Encounter: Payer: Self-pay | Admitting: Hematology and Oncology

## 2021-10-16 ENCOUNTER — Ambulatory Visit: Payer: Medicare Other | Admitting: Internal Medicine

## 2021-10-16 DIAGNOSIS — C419 Malignant neoplasm of bone and articular cartilage, unspecified: Secondary | ICD-10-CM | POA: Insufficient documentation

## 2021-10-16 NOTE — Telephone Encounter (Signed)
Patient's wife [name] called stating Dr. Estanislado Pandy referred Bruce Ball to pain management.  She states he will not be able to schedule with their office because he fell last week and won't be mobile for a few months.

## 2021-10-17 DIAGNOSIS — S7291XA Unspecified fracture of right femur, initial encounter for closed fracture: Secondary | ICD-10-CM | POA: Insufficient documentation

## 2021-10-17 HISTORY — PX: FEMUR FRACTURE SURGERY: SHX633

## 2021-10-23 DIAGNOSIS — G8918 Other acute postprocedural pain: Secondary | ICD-10-CM | POA: Insufficient documentation

## 2021-10-25 DIAGNOSIS — D62 Acute posthemorrhagic anemia: Secondary | ICD-10-CM | POA: Diagnosis not present

## 2021-10-25 DIAGNOSIS — Z87891 Personal history of nicotine dependence: Secondary | ICD-10-CM | POA: Diagnosis not present

## 2021-10-25 DIAGNOSIS — Z96649 Presence of unspecified artificial hip joint: Secondary | ICD-10-CM | POA: Diagnosis not present

## 2021-10-25 DIAGNOSIS — I2510 Atherosclerotic heart disease of native coronary artery without angina pectoris: Secondary | ICD-10-CM | POA: Diagnosis not present

## 2021-10-25 DIAGNOSIS — Z96641 Presence of right artificial hip joint: Secondary | ICD-10-CM | POA: Diagnosis not present

## 2021-10-25 DIAGNOSIS — N183 Chronic kidney disease, stage 3 unspecified: Secondary | ICD-10-CM | POA: Diagnosis not present

## 2021-10-25 DIAGNOSIS — Z743 Need for continuous supervision: Secondary | ICD-10-CM | POA: Diagnosis not present

## 2021-10-25 DIAGNOSIS — Z471 Aftercare following joint replacement surgery: Secondary | ICD-10-CM | POA: Diagnosis not present

## 2021-10-25 DIAGNOSIS — Z86711 Personal history of pulmonary embolism: Secondary | ICD-10-CM | POA: Diagnosis not present

## 2021-10-25 DIAGNOSIS — I2699 Other pulmonary embolism without acute cor pulmonale: Secondary | ICD-10-CM | POA: Diagnosis not present

## 2021-10-25 DIAGNOSIS — S40811D Abrasion of right upper arm, subsequent encounter: Secondary | ICD-10-CM | POA: Diagnosis not present

## 2021-10-25 DIAGNOSIS — S7291XA Unspecified fracture of right femur, initial encounter for closed fracture: Secondary | ICD-10-CM | POA: Diagnosis not present

## 2021-10-25 DIAGNOSIS — M978XXA Periprosthetic fracture around other internal prosthetic joint, initial encounter: Secondary | ICD-10-CM | POA: Diagnosis not present

## 2021-10-25 DIAGNOSIS — Z600 Problems of adjustment to life-cycle transitions: Secondary | ICD-10-CM | POA: Diagnosis not present

## 2021-10-25 DIAGNOSIS — G8918 Other acute postprocedural pain: Secondary | ICD-10-CM | POA: Diagnosis not present

## 2021-10-25 DIAGNOSIS — Z7901 Long term (current) use of anticoagulants: Secondary | ICD-10-CM | POA: Diagnosis not present

## 2021-10-25 DIAGNOSIS — K279 Peptic ulcer, site unspecified, unspecified as acute or chronic, without hemorrhage or perforation: Secondary | ICD-10-CM | POA: Diagnosis not present

## 2021-10-25 DIAGNOSIS — Z7409 Other reduced mobility: Secondary | ICD-10-CM | POA: Diagnosis not present

## 2021-10-25 DIAGNOSIS — M6281 Muscle weakness (generalized): Secondary | ICD-10-CM | POA: Diagnosis not present

## 2021-10-25 DIAGNOSIS — R262 Difficulty in walking, not elsewhere classified: Secondary | ICD-10-CM | POA: Diagnosis not present

## 2021-10-25 DIAGNOSIS — I130 Hypertensive heart and chronic kidney disease with heart failure and stage 1 through stage 4 chronic kidney disease, or unspecified chronic kidney disease: Secondary | ICD-10-CM | POA: Diagnosis not present

## 2021-10-25 DIAGNOSIS — I10 Essential (primary) hypertension: Secondary | ICD-10-CM | POA: Diagnosis not present

## 2021-10-25 DIAGNOSIS — I5032 Chronic diastolic (congestive) heart failure: Secondary | ICD-10-CM | POA: Diagnosis not present

## 2021-10-25 DIAGNOSIS — C419 Malignant neoplasm of bone and articular cartilage, unspecified: Secondary | ICD-10-CM | POA: Diagnosis not present

## 2021-10-25 DIAGNOSIS — M9701XD Periprosthetic fracture around internal prosthetic right hip joint, subsequent encounter: Secondary | ICD-10-CM | POA: Diagnosis not present

## 2021-10-26 DIAGNOSIS — D62 Acute posthemorrhagic anemia: Secondary | ICD-10-CM | POA: Diagnosis not present

## 2021-10-26 DIAGNOSIS — M978XXA Periprosthetic fracture around other internal prosthetic joint, initial encounter: Secondary | ICD-10-CM | POA: Diagnosis not present

## 2021-10-26 DIAGNOSIS — Z86711 Personal history of pulmonary embolism: Secondary | ICD-10-CM | POA: Diagnosis not present

## 2021-10-26 DIAGNOSIS — I10 Essential (primary) hypertension: Secondary | ICD-10-CM | POA: Diagnosis not present

## 2021-10-26 DIAGNOSIS — K279 Peptic ulcer, site unspecified, unspecified as acute or chronic, without hemorrhage or perforation: Secondary | ICD-10-CM | POA: Diagnosis not present

## 2021-10-26 DIAGNOSIS — Z96649 Presence of unspecified artificial hip joint: Secondary | ICD-10-CM | POA: Diagnosis not present

## 2021-10-26 DIAGNOSIS — I5032 Chronic diastolic (congestive) heart failure: Secondary | ICD-10-CM | POA: Diagnosis not present

## 2021-10-30 DIAGNOSIS — Z87891 Personal history of nicotine dependence: Secondary | ICD-10-CM | POA: Diagnosis not present

## 2021-10-30 DIAGNOSIS — G8918 Other acute postprocedural pain: Secondary | ICD-10-CM | POA: Diagnosis not present

## 2021-10-30 DIAGNOSIS — I2510 Atherosclerotic heart disease of native coronary artery without angina pectoris: Secondary | ICD-10-CM | POA: Diagnosis not present

## 2021-10-30 DIAGNOSIS — M6281 Muscle weakness (generalized): Secondary | ICD-10-CM | POA: Diagnosis not present

## 2021-10-30 DIAGNOSIS — K279 Peptic ulcer, site unspecified, unspecified as acute or chronic, without hemorrhage or perforation: Secondary | ICD-10-CM | POA: Diagnosis not present

## 2021-10-30 DIAGNOSIS — Z7901 Long term (current) use of anticoagulants: Secondary | ICD-10-CM | POA: Diagnosis not present

## 2021-10-30 DIAGNOSIS — I5032 Chronic diastolic (congestive) heart failure: Secondary | ICD-10-CM | POA: Diagnosis not present

## 2021-10-30 DIAGNOSIS — Z7409 Other reduced mobility: Secondary | ICD-10-CM | POA: Diagnosis not present

## 2021-10-30 DIAGNOSIS — Z471 Aftercare following joint replacement surgery: Secondary | ICD-10-CM | POA: Diagnosis not present

## 2021-10-30 DIAGNOSIS — C419 Malignant neoplasm of bone and articular cartilage, unspecified: Secondary | ICD-10-CM | POA: Diagnosis not present

## 2021-10-30 DIAGNOSIS — M9701XD Periprosthetic fracture around internal prosthetic right hip joint, subsequent encounter: Secondary | ICD-10-CM | POA: Diagnosis not present

## 2021-10-30 DIAGNOSIS — Z600 Problems of adjustment to life-cycle transitions: Secondary | ICD-10-CM | POA: Diagnosis not present

## 2021-10-30 DIAGNOSIS — Z96641 Presence of right artificial hip joint: Secondary | ICD-10-CM | POA: Diagnosis not present

## 2021-10-30 DIAGNOSIS — I2699 Other pulmonary embolism without acute cor pulmonale: Secondary | ICD-10-CM | POA: Diagnosis not present

## 2021-10-30 DIAGNOSIS — R262 Difficulty in walking, not elsewhere classified: Secondary | ICD-10-CM | POA: Diagnosis not present

## 2021-10-30 DIAGNOSIS — N183 Chronic kidney disease, stage 3 unspecified: Secondary | ICD-10-CM | POA: Diagnosis not present

## 2021-10-30 DIAGNOSIS — D62 Acute posthemorrhagic anemia: Secondary | ICD-10-CM | POA: Diagnosis not present

## 2021-10-30 DIAGNOSIS — I130 Hypertensive heart and chronic kidney disease with heart failure and stage 1 through stage 4 chronic kidney disease, or unspecified chronic kidney disease: Secondary | ICD-10-CM | POA: Diagnosis not present

## 2021-11-05 DIAGNOSIS — M978XXA Periprosthetic fracture around other internal prosthetic joint, initial encounter: Secondary | ICD-10-CM | POA: Diagnosis not present

## 2021-11-05 DIAGNOSIS — I5032 Chronic diastolic (congestive) heart failure: Secondary | ICD-10-CM | POA: Diagnosis not present

## 2021-11-05 DIAGNOSIS — Z96649 Presence of unspecified artificial hip joint: Secondary | ICD-10-CM | POA: Diagnosis not present

## 2021-11-05 DIAGNOSIS — D62 Acute posthemorrhagic anemia: Secondary | ICD-10-CM | POA: Diagnosis not present

## 2021-11-05 DIAGNOSIS — I10 Essential (primary) hypertension: Secondary | ICD-10-CM | POA: Diagnosis not present

## 2021-11-16 ENCOUNTER — Ambulatory Visit: Payer: Medicare Other

## 2021-11-19 DIAGNOSIS — D62 Acute posthemorrhagic anemia: Secondary | ICD-10-CM | POA: Diagnosis not present

## 2021-11-19 DIAGNOSIS — S40811A Abrasion of right upper arm, initial encounter: Secondary | ICD-10-CM | POA: Insufficient documentation

## 2021-11-19 DIAGNOSIS — I10 Essential (primary) hypertension: Secondary | ICD-10-CM | POA: Diagnosis not present

## 2021-11-19 DIAGNOSIS — S40811D Abrasion of right upper arm, subsequent encounter: Secondary | ICD-10-CM | POA: Diagnosis not present

## 2021-11-19 DIAGNOSIS — I5032 Chronic diastolic (congestive) heart failure: Secondary | ICD-10-CM | POA: Diagnosis not present

## 2021-11-27 DIAGNOSIS — Z96641 Presence of right artificial hip joint: Secondary | ICD-10-CM | POA: Diagnosis not present

## 2021-11-27 DIAGNOSIS — Z96649 Presence of unspecified artificial hip joint: Secondary | ICD-10-CM | POA: Diagnosis not present

## 2021-11-27 DIAGNOSIS — M978XXA Periprosthetic fracture around other internal prosthetic joint, initial encounter: Secondary | ICD-10-CM | POA: Diagnosis not present

## 2021-11-29 DIAGNOSIS — I5032 Chronic diastolic (congestive) heart failure: Secondary | ICD-10-CM | POA: Diagnosis not present

## 2021-11-29 DIAGNOSIS — Z86711 Personal history of pulmonary embolism: Secondary | ICD-10-CM | POA: Diagnosis not present

## 2021-11-29 DIAGNOSIS — I10 Essential (primary) hypertension: Secondary | ICD-10-CM | POA: Diagnosis not present

## 2021-11-29 DIAGNOSIS — Z96649 Presence of unspecified artificial hip joint: Secondary | ICD-10-CM | POA: Diagnosis not present

## 2021-11-29 DIAGNOSIS — D62 Acute posthemorrhagic anemia: Secondary | ICD-10-CM | POA: Diagnosis not present

## 2021-11-29 DIAGNOSIS — M978XXA Periprosthetic fracture around other internal prosthetic joint, initial encounter: Secondary | ICD-10-CM | POA: Diagnosis not present

## 2021-12-02 DIAGNOSIS — E669 Obesity, unspecified: Secondary | ICD-10-CM | POA: Diagnosis not present

## 2021-12-02 DIAGNOSIS — D62 Acute posthemorrhagic anemia: Secondary | ICD-10-CM | POA: Diagnosis not present

## 2021-12-02 DIAGNOSIS — I2510 Atherosclerotic heart disease of native coronary artery without angina pectoris: Secondary | ICD-10-CM | POA: Diagnosis not present

## 2021-12-02 DIAGNOSIS — I130 Hypertensive heart and chronic kidney disease with heart failure and stage 1 through stage 4 chronic kidney disease, or unspecified chronic kidney disease: Secondary | ICD-10-CM | POA: Diagnosis not present

## 2021-12-02 DIAGNOSIS — Z86711 Personal history of pulmonary embolism: Secondary | ICD-10-CM | POA: Diagnosis not present

## 2021-12-02 DIAGNOSIS — E785 Hyperlipidemia, unspecified: Secondary | ICD-10-CM | POA: Diagnosis not present

## 2021-12-02 DIAGNOSIS — I5032 Chronic diastolic (congestive) heart failure: Secondary | ICD-10-CM | POA: Diagnosis not present

## 2021-12-02 DIAGNOSIS — J45909 Unspecified asthma, uncomplicated: Secondary | ICD-10-CM | POA: Diagnosis not present

## 2021-12-02 DIAGNOSIS — Z8546 Personal history of malignant neoplasm of prostate: Secondary | ICD-10-CM | POA: Diagnosis not present

## 2021-12-02 DIAGNOSIS — M9701XD Periprosthetic fracture around internal prosthetic right hip joint, subsequent encounter: Secondary | ICD-10-CM | POA: Diagnosis not present

## 2021-12-02 DIAGNOSIS — Z87891 Personal history of nicotine dependence: Secondary | ICD-10-CM | POA: Diagnosis not present

## 2021-12-02 DIAGNOSIS — Z87442 Personal history of urinary calculi: Secondary | ICD-10-CM | POA: Diagnosis not present

## 2021-12-02 DIAGNOSIS — Z6829 Body mass index (BMI) 29.0-29.9, adult: Secondary | ICD-10-CM | POA: Diagnosis not present

## 2021-12-02 DIAGNOSIS — G9340 Encephalopathy, unspecified: Secondary | ICD-10-CM | POA: Diagnosis not present

## 2021-12-02 DIAGNOSIS — K219 Gastro-esophageal reflux disease without esophagitis: Secondary | ICD-10-CM | POA: Diagnosis not present

## 2021-12-02 DIAGNOSIS — Z7901 Long term (current) use of anticoagulants: Secondary | ICD-10-CM | POA: Diagnosis not present

## 2021-12-02 DIAGNOSIS — M5416 Radiculopathy, lumbar region: Secondary | ICD-10-CM | POA: Diagnosis not present

## 2021-12-02 DIAGNOSIS — S72301D Unspecified fracture of shaft of right femur, subsequent encounter for closed fracture with routine healing: Secondary | ICD-10-CM | POA: Diagnosis not present

## 2021-12-02 DIAGNOSIS — N1831 Chronic kidney disease, stage 3a: Secondary | ICD-10-CM | POA: Diagnosis not present

## 2021-12-04 ENCOUNTER — Other Ambulatory Visit: Payer: Self-pay

## 2021-12-04 ENCOUNTER — Ambulatory Visit (INDEPENDENT_AMBULATORY_CARE_PROVIDER_SITE_OTHER): Payer: Medicare Other | Admitting: Family Medicine

## 2021-12-04 ENCOUNTER — Encounter: Payer: Self-pay | Admitting: Family Medicine

## 2021-12-04 VITALS — BP 120/64 | HR 74 | Ht 71.0 in | Wt 214.0 lb

## 2021-12-04 DIAGNOSIS — M79642 Pain in left hand: Secondary | ICD-10-CM | POA: Diagnosis not present

## 2021-12-04 DIAGNOSIS — M79641 Pain in right hand: Secondary | ICD-10-CM

## 2021-12-04 DIAGNOSIS — N183 Chronic kidney disease, stage 3 unspecified: Secondary | ICD-10-CM

## 2021-12-04 DIAGNOSIS — I2510 Atherosclerotic heart disease of native coronary artery without angina pectoris: Secondary | ICD-10-CM

## 2021-12-04 DIAGNOSIS — E785 Hyperlipidemia, unspecified: Secondary | ICD-10-CM | POA: Diagnosis not present

## 2021-12-04 DIAGNOSIS — I5032 Chronic diastolic (congestive) heart failure: Secondary | ICD-10-CM

## 2021-12-04 DIAGNOSIS — M9701XA Periprosthetic fracture around internal prosthetic right hip joint, initial encounter: Secondary | ICD-10-CM

## 2021-12-04 DIAGNOSIS — G8929 Other chronic pain: Secondary | ICD-10-CM | POA: Diagnosis not present

## 2021-12-04 DIAGNOSIS — I10 Essential (primary) hypertension: Secondary | ICD-10-CM

## 2021-12-04 DIAGNOSIS — M545 Low back pain, unspecified: Secondary | ICD-10-CM | POA: Diagnosis not present

## 2021-12-04 NOTE — Progress Notes (Signed)
Phone [phone] In person visit   Subjective:   Bruce Ball is a 75 y.o. year old very pleasant male patient who presents for/with See problem oriented charting Chief Complaint  Patient presents with   Follow-up   hip pain    Pt c/o right hip after surgery.    This visit occurred during the SARS-CoV-2 public health emergency.  Safety protocols were in place, including screening questions prior to the visit, additional usage of staff PPE, and extensive cleaning of exam room while observing appropriate contact time as indicated for disinfecting solutions.   Past Medical History-  Patient Active Problem List   Diagnosis Date Noted   CAD (coronary artery disease) 04/03/2021    Priority: High   Diastolic CHF ([place]) 61/95/0932    Priority: High   Aortic atherosclerosis ([place]) 05/17/2020    Priority: High   Idiopathic neuropathy 01/28/2020    Priority: High   Recurrent falls 01/28/2020    Priority: High   Pulmonary emboli ([place]) 01/14/2020    Priority: High   High risk medication use 02/28/2017    Priority: High   Ataxia 05/18/2016    Priority: High   Vestibular disequilibrium 05/18/2016    Priority: High   Ankylosing spondylitis of cervical region ([place]) 05/24/2014    Priority: High   B12 deficiency 05/17/2020    Priority: Medium    Iliac artery aneurysm ([place]) 05/17/2020    Priority: Medium    Osteoporosis 04/04/2020    Priority: Medium    Chronic kidney disease (CKD), stage III (moderate) ([place]) 01/19/2020    Priority: Medium    GERD (gastroesophageal reflux disease) 12/22/2019    Priority: Medium    Essential tremor 12/22/2019    Priority: Medium    Hyperlipidemia 11/11/2017    Priority: Medium    Right ureteral stone 11/05/2017    Priority: Medium    History of prostate cancer 03/08/2017    Priority: Medium    Personal history of prostate cancer 02/28/2017    Priority: Medium    Peripheral edema 06/08/2012    Priority: Medium    Extrinsic asthma 04/07/2009     Priority: Medium    Backache 04/21/2008    Priority: Medium    History of peptic ulcer disease 04/21/2008    Priority: Medium    Essential hypertension 10/29/2007    Priority: Medium    Chronic anticoagulation 03/26/2021    Priority: Low   Acquired coagulation disorder ([place]) 12/12/2020    Priority: Low   Senile purpura ([place]) 12/12/2020    Priority: Low   Femoral condyle fracture ([place]) 07/15/2019    Priority: Low   Community acquired pneumonia of right upper lobe of lung 11/05/2017    Priority: Low   History of total right hip replacement 03/08/2017    Priority: Low   DDD (degenerative disc disease), thoracic 02/28/2017    Priority: Low   Lung nodule, solitary 05/24/2014    Priority: Low   History of pulmonary embolism 10/10/2021   HTN (hypertension) 10/10/2021   Chronic diastolic CHF (congestive heart failure) ([place]) 10/10/2021   GERD (gastroesophageal reflux disease) 10/10/2021   Ankylosing spondylitis ([place]) 10/10/2021   CKD (chronic kidney disease), stage III ([place]) 10/10/2021   Hyperlipidemia 10/10/2021   CAD (coronary artery disease) 10/10/2021   Periprosthetic fracture of shaft of femur 10/10/2021   Other fatigue 06/05/2021   SOB (shortness of breath) on exertion 03/26/2021   Anemia in chronic kidney disease 01/25/2021   S/P lumbar fusion 08/03/2020  Thin skin 07/25/2020   Gross hematuria 06/14/2019   Kidney stone 11/05/2017   Lumbosacral stenosis 06/05/2013   Subcortical microvascular ischemic occlusive disease 08/13/2010    Medications- reviewed and updated Current Outpatient Medications  Medication Sig Dispense Refill   acetaminophen (TYLENOL) 325 MG tablet Take 2 tablets (650 mg total) by mouth every 6 (six) hours as needed for mild pain or moderate pain.     apixaban (ELIQUIS) 2.5 MG TABS tablet Take 1 tablet (2.5 mg total) by mouth 2 (two) times daily.     atorvastatin (LIPITOR) 20 MG tablet Take 1 tablet (20 mg total) by mouth at bedtime. 90 tablet 3    carvedilol (COREG) 25 MG tablet Take 25 mg by mouth 2 (two) times daily.     Cholecalciferol (VITAMIN D3) 50 MCG (2000 UT) capsule Take 2,000 Units by mouth in the morning and at bedtime.      DULoxetine (CYMBALTA) 30 MG capsule Take 30 mg by mouth daily.     ferrous sulfate 325 (65 FE) MG tablet Take 325 mg by mouth every Monday, Wednesday, and Friday.     gabapentin (NEURONTIN) 300 MG capsule Take 300 mg by mouth at bedtime.     indapamide (LOZOL) 1.25 MG tablet Take 1.25 mg by mouth daily.     metoCLOPramide (REGLAN) 5 MG tablet TAKE 1 TABLET (5 MG TOTAL) BY MOUTH 3 (THREE) TIMES DAILY BEFORE MEALS. 90 tablet 1   Multiple Vitamins-Minerals (MULTIVITAMIN WITH MINERALS) tablet Take 1 tablet by mouth daily.     pantoprazole (PROTONIX) 40 MG tablet Take 40 mg by mouth 2 (two) times daily before a meal.     Propylene Glycol (SYSTANE BALANCE OP) Place 1 drop into both eyes in the morning and at bedtime.     No current facility-administered medications for this visit.     Objective:  BP 120/64   Pulse 74   Ht 5\' 11"  (1.803 m)   Wt 214 lb (97.1 kg)   SpO2 98%   BMI 29.85 kg/m  Gen: NAD, resting comfortably CV: RRR no murmurs rubs or gallops Lungs: CTAB no crackles, wheeze, rhonchi Ext: 1+ edema slightly worse than previous but patient is more sedentary-he is very tender with palpation of his lower legs per baseline Skin: warm, dry, no obvious rash on lower legs Neuro: In wheelchair after recent fracture     Assessment/plan    #Post-Operation Update- S: Patient had a periprosthetic fracture of the shaft of the femur done on 11/27/2021 with [facility]-right distal one third femoral periprosthetic fracture.  Appears admitted on 10/14/2021 and discharged to nursing home by 10/26/2021.  Patient did have delirium while hospitalized and pain management had to be adjusted.  He did have some right lower extremity swelling and hospitalization and due to history of DVT right lower extremity ultrasound  was performed which thankfully was negative  Patient was discharged on Dilaudid for pain management to skilled nursing facility- [facility]. Came home on 11/29/21- pain seems to be worse sinc ebeing home- thinks laying on his side may be stressing it- wakes up and even if goes to sleep on back- wakes on side which was not in hospital. Has not seen OT at home yet- they may have recommendations but we discussed wedge pillow. Also now 50% weight bearing so that could contribute as well  Patient had surgery follow-up on 10/30/2021 with plan for another follow-up in approximately a month.  He was seen again on 11/27/2021 and was advanced to  50% partial weightbearing on right lower extremity.  With plan for 6-week follow-up  Patient with history of anemia but hemoglobin dropped to 7.9 prior to discharge again noted 10/25/21- only taking tylenol for pain. Had been on dilaudid and later oxycodone and wanted to come off. Has some old tramadol may try.  - back pain thankfully has not worsened - has already seen PT and pending OT visit who may help [name] with sleep issues/worse pain in AM - [facility] - I can sign orders if needed -on vitamin D post fracture -doesn't sleep well on oxycocone and wanted to stop  # Anemia- hematology has not been overly concerned. Eliquis likely contributing. On iron MWF- we will monitor CBC with labs today  #Diastolic CHF S: Patient has not required Lasix recently.  Weight is actually trending down-does have some edema with secondary activity - has seen nutritionist and found helpful- now working with healthy weight to wellness-continues to lose weight A/P: Appears euvolemic based off of weight trend-some swelling but I think this is dependent edema  #hypertension S: medication: lozol 1.25 mg daily and coreg 25 mg twice daily, lasix as needed-has not needed lately BP Readings from Last 3 Encounters:  12/04/21 120/64  10/13/21 (!)  116/57  10/01/21 (!) 147/79  A/P:  Controlled. Continue current medications.    #hyperlipidemia #coronary artery calcium score of 152 so LDL goal under 70- at goal #aortic atherosclerosis S: Medication: Atorvastatin 20 mg daily, since on eliquis will remain off aspirin  Lab Results  Component Value Date   CHOL 101 07/11/2021   HDL 34 (A) 07/11/2021   LDLCALC 50 07/11/2021   LDLDIRECT 145.4 06/27/2011   TRIG 84 07/11/2021   CHOLHDL 3 04/03/2021   A/P: HLD at goal for aortic atherosclreosis and coronary calcium notatoin- 70 or less- continue current meds  #Chronic kidney disease stage III S: GFR is typically in the 40s or 50s range-most recent range on consistent actually above 60 -Patient knows to avoid NSAIDs especially with being on Eliquis A/P: Has been stable-update CMP  #Chronic DVT/PE- continued chronic Eliquis 2.5 mg twice daily as recommended by hematology/oncology-[facility]-Dr. Alvy Bimler recommended ongoing therapy on 07/20/21 due to risk from sedentary activity -Appears controlled without recurrence-continue current medication  # cymbalta mainly for arthritis - not depression - [facility]- seen for bilateral hand pain with stiffness that responded to prednisone- denies depression or anhedonia. Not diagnosed with rheumatological issues. He is not sure how much helping Recommended follow up: Return in about 4 months (around 04/04/2022) for follow up- or sooner if needed. Future Appointments  Date Time Provider [place]  01/17/2022  3:00 PM Sueanne Margarita, MD [id number]    Lab/Order associations:   ICD-10-CM   1. Periprosthetic fracture around internal prosthetic right hip joint, initial encounter ([place])  M97.01XA     2. Primary hypertension  I10 CBC with Differential/Platelet    Comprehensive metabolic panel    3. Hyperlipidemia, unspecified hyperlipidemia type  E78.5     4. Stage 3 chronic kidney  disease, unspecified whether stage 3a or 3b CKD (HCC)  N18.30     5. Chronic diastolic CHF (congestive heart failure) (HCC)  I50.32     6. Chronic bilateral low back pain without sciatica  M54.50    G89.29     7. Pain in both hands  M79.641    M79.642       I,Harris Phan,acting as  a scribe for [name], MD.,have documented all relevant documentation on the behalf of [name], MD,as directed by  [name], MD while in the presence of [name], MD.  I, [name], MD, have reviewed all documentation for this visit. The documentation on 12/04/21 for the exam, diagnosis, procedures, and orders are all accurate and complete.   Return precautions advised.  [name], MD

## 2021-12-04 NOTE — Patient Instructions (Addendum)
Health Maintenance Due  Topic Date Due   Fecal DNA (Cologuard or colonoscopy)   Never done   Pneumonia Vaccine 21+ Years old (2 - PPSV23 if available, else PCV20)- prevnar 05/20/2015   Zoster Vaccines- Shingrix (2 of 2) 01/23/2021  If you can get [name] any of this info from the [place]- would be great  Please stop by lab before you go If you have mychart- we will send your results within 3 business days of [name] receiving them.  If you do not have mychart- we will call you about results within 5 business days of [name] receiving them.  *please also note that you will see labs on mychart as soon as they post. I will later go in and write notes on them- will say "notes from Dr. Yong Channel"   Could try wedge pillow to see if it will help you from rolling off your bed - especially on the side that needs it most. I would also wait to see what OT say in regards to toy sleep issues.  Could also try tramadol for your pain if needed.  I am glad to see that you are doing well overall! All things considered  Recommended follow up: Return in about 4 months (around 04/04/2022) for follow up- or sooner if needed.

## 2021-12-05 DIAGNOSIS — S72301D Unspecified fracture of shaft of right femur, subsequent encounter for closed fracture with routine healing: Secondary | ICD-10-CM | POA: Diagnosis not present

## 2021-12-05 DIAGNOSIS — J45909 Unspecified asthma, uncomplicated: Secondary | ICD-10-CM | POA: Diagnosis not present

## 2021-12-05 DIAGNOSIS — M9701XD Periprosthetic fracture around internal prosthetic right hip joint, subsequent encounter: Secondary | ICD-10-CM | POA: Diagnosis not present

## 2021-12-05 DIAGNOSIS — I130 Hypertensive heart and chronic kidney disease with heart failure and stage 1 through stage 4 chronic kidney disease, or unspecified chronic kidney disease: Secondary | ICD-10-CM | POA: Diagnosis not present

## 2021-12-05 DIAGNOSIS — N1831 Chronic kidney disease, stage 3a: Secondary | ICD-10-CM | POA: Diagnosis not present

## 2021-12-05 DIAGNOSIS — I5032 Chronic diastolic (congestive) heart failure: Secondary | ICD-10-CM | POA: Diagnosis not present

## 2021-12-05 LAB — CBC WITH DIFFERENTIAL/PLATELET
Basophils Absolute: 0.1 10*3/uL (ref 0.0–0.1)
Basophils Relative: 1.3 % (ref 0.0–3.0)
Eosinophils Absolute: 0.7 10*3/uL (ref 0.0–0.7)
Eosinophils Relative: 10.3 % — ABNORMAL HIGH (ref 0.0–5.0)
HCT: 35.7 % — ABNORMAL LOW (ref 39.0–52.0)
Hemoglobin: 11 g/dL — ABNORMAL LOW (ref 13.0–17.0)
Lymphocytes Relative: 23.3 % (ref 12.0–46.0)
Lymphs Abs: 1.6 10*3/uL (ref 0.7–4.0)
MCHC: 30.9 g/dL (ref 30.0–36.0)
MCV: 80.4 fl (ref 78.0–100.0)
Monocytes Absolute: 0.8 10*3/uL (ref 0.1–1.0)
Monocytes Relative: 12.3 % — ABNORMAL HIGH (ref 3.0–12.0)
Neutro Abs: 3.5 10*3/uL (ref 1.4–7.7)
Neutrophils Relative %: 52.8 % (ref 43.0–77.0)
Platelets: 331 10*3/uL (ref 150.0–400.0)
RBC: 4.44 Mil/uL (ref 4.22–5.81)
RDW: 16.4 % — ABNORMAL HIGH (ref 11.5–15.5)
WBC: 6.7 10*3/uL (ref 4.0–10.5)

## 2021-12-05 LAB — COMPREHENSIVE METABOLIC PANEL
ALT: 8 U/L (ref 0–53)
AST: 15 U/L (ref 0–37)
Albumin: 3.2 g/dL — ABNORMAL LOW (ref 3.5–5.2)
Alkaline Phosphatase: 70 U/L (ref 39–117)
BUN: 19 mg/dL (ref 6–23)
CO2: 30 mEq/L (ref 19–32)
Calcium: 8.9 mg/dL (ref 8.4–10.5)
Chloride: 107 mEq/L (ref 96–112)
Creatinine, Ser: 1.29 mg/dL (ref 0.40–1.50)
GFR: 54.25 mL/min — ABNORMAL LOW (ref >60.00)
Glucose, Bld: 87 mg/dL (ref 70–99)
Potassium: 4.8 mEq/L (ref 3.5–5.1)
Sodium: 141 mEq/L (ref 135–145)
Total Bilirubin: 0.4 mg/dL (ref 0.2–1.2)
Total Protein: 6 g/dL (ref 6.0–8.3)

## 2021-12-06 MED ORDER — TRAMADOL HCL 50 MG PO TABS: 25.0000 mg | ORAL_TABLET | Freq: Three times a day (TID) | ORAL | 0 refills | Status: DC | PRN

## 2021-12-07 DIAGNOSIS — I130 Hypertensive heart and chronic kidney disease with heart failure and stage 1 through stage 4 chronic kidney disease, or unspecified chronic kidney disease: Secondary | ICD-10-CM | POA: Diagnosis not present

## 2021-12-07 DIAGNOSIS — I5032 Chronic diastolic (congestive) heart failure: Secondary | ICD-10-CM | POA: Diagnosis not present

## 2021-12-07 DIAGNOSIS — N1831 Chronic kidney disease, stage 3a: Secondary | ICD-10-CM | POA: Diagnosis not present

## 2021-12-07 DIAGNOSIS — J45909 Unspecified asthma, uncomplicated: Secondary | ICD-10-CM | POA: Diagnosis not present

## 2021-12-07 DIAGNOSIS — M9701XD Periprosthetic fracture around internal prosthetic right hip joint, subsequent encounter: Secondary | ICD-10-CM | POA: Diagnosis not present

## 2021-12-07 DIAGNOSIS — S72301D Unspecified fracture of shaft of right femur, subsequent encounter for closed fracture with routine healing: Secondary | ICD-10-CM | POA: Diagnosis not present

## 2021-12-10 DIAGNOSIS — J45909 Unspecified asthma, uncomplicated: Secondary | ICD-10-CM | POA: Diagnosis not present

## 2021-12-10 DIAGNOSIS — I5032 Chronic diastolic (congestive) heart failure: Secondary | ICD-10-CM | POA: Diagnosis not present

## 2021-12-10 DIAGNOSIS — S72301D Unspecified fracture of shaft of right femur, subsequent encounter for closed fracture with routine healing: Secondary | ICD-10-CM | POA: Diagnosis not present

## 2021-12-10 DIAGNOSIS — M9701XD Periprosthetic fracture around internal prosthetic right hip joint, subsequent encounter: Secondary | ICD-10-CM | POA: Diagnosis not present

## 2021-12-10 DIAGNOSIS — I130 Hypertensive heart and chronic kidney disease with heart failure and stage 1 through stage 4 chronic kidney disease, or unspecified chronic kidney disease: Secondary | ICD-10-CM | POA: Diagnosis not present

## 2021-12-10 DIAGNOSIS — N1831 Chronic kidney disease, stage 3a: Secondary | ICD-10-CM | POA: Diagnosis not present

## 2021-12-11 DIAGNOSIS — I5032 Chronic diastolic (congestive) heart failure: Secondary | ICD-10-CM | POA: Diagnosis not present

## 2021-12-11 DIAGNOSIS — I130 Hypertensive heart and chronic kidney disease with heart failure and stage 1 through stage 4 chronic kidney disease, or unspecified chronic kidney disease: Secondary | ICD-10-CM | POA: Diagnosis not present

## 2021-12-11 DIAGNOSIS — J45909 Unspecified asthma, uncomplicated: Secondary | ICD-10-CM | POA: Diagnosis not present

## 2021-12-11 DIAGNOSIS — N1831 Chronic kidney disease, stage 3a: Secondary | ICD-10-CM | POA: Diagnosis not present

## 2021-12-11 DIAGNOSIS — S72301D Unspecified fracture of shaft of right femur, subsequent encounter for closed fracture with routine healing: Secondary | ICD-10-CM | POA: Diagnosis not present

## 2021-12-11 DIAGNOSIS — M9701XD Periprosthetic fracture around internal prosthetic right hip joint, subsequent encounter: Secondary | ICD-10-CM | POA: Diagnosis not present

## 2021-12-13 DIAGNOSIS — M9701XD Periprosthetic fracture around internal prosthetic right hip joint, subsequent encounter: Secondary | ICD-10-CM | POA: Diagnosis not present

## 2021-12-13 DIAGNOSIS — I130 Hypertensive heart and chronic kidney disease with heart failure and stage 1 through stage 4 chronic kidney disease, or unspecified chronic kidney disease: Secondary | ICD-10-CM | POA: Diagnosis not present

## 2021-12-13 DIAGNOSIS — J45909 Unspecified asthma, uncomplicated: Secondary | ICD-10-CM | POA: Diagnosis not present

## 2021-12-13 DIAGNOSIS — I5032 Chronic diastolic (congestive) heart failure: Secondary | ICD-10-CM | POA: Diagnosis not present

## 2021-12-13 DIAGNOSIS — N1831 Chronic kidney disease, stage 3a: Secondary | ICD-10-CM | POA: Diagnosis not present

## 2021-12-13 DIAGNOSIS — S72301D Unspecified fracture of shaft of right femur, subsequent encounter for closed fracture with routine healing: Secondary | ICD-10-CM | POA: Diagnosis not present

## 2021-12-16 ENCOUNTER — Encounter: Payer: Self-pay | Admitting: Family Medicine

## 2021-12-17 DIAGNOSIS — J45909 Unspecified asthma, uncomplicated: Secondary | ICD-10-CM | POA: Diagnosis not present

## 2021-12-17 DIAGNOSIS — M9701XD Periprosthetic fracture around internal prosthetic right hip joint, subsequent encounter: Secondary | ICD-10-CM | POA: Diagnosis not present

## 2021-12-17 DIAGNOSIS — S72301D Unspecified fracture of shaft of right femur, subsequent encounter for closed fracture with routine healing: Secondary | ICD-10-CM | POA: Diagnosis not present

## 2021-12-17 DIAGNOSIS — I5032 Chronic diastolic (congestive) heart failure: Secondary | ICD-10-CM | POA: Diagnosis not present

## 2021-12-17 DIAGNOSIS — N1831 Chronic kidney disease, stage 3a: Secondary | ICD-10-CM | POA: Diagnosis not present

## 2021-12-17 DIAGNOSIS — I130 Hypertensive heart and chronic kidney disease with heart failure and stage 1 through stage 4 chronic kidney disease, or unspecified chronic kidney disease: Secondary | ICD-10-CM | POA: Diagnosis not present

## 2021-12-18 DIAGNOSIS — I130 Hypertensive heart and chronic kidney disease with heart failure and stage 1 through stage 4 chronic kidney disease, or unspecified chronic kidney disease: Secondary | ICD-10-CM | POA: Diagnosis not present

## 2021-12-18 DIAGNOSIS — M9701XD Periprosthetic fracture around internal prosthetic right hip joint, subsequent encounter: Secondary | ICD-10-CM | POA: Diagnosis not present

## 2021-12-18 DIAGNOSIS — S72301D Unspecified fracture of shaft of right femur, subsequent encounter for closed fracture with routine healing: Secondary | ICD-10-CM | POA: Diagnosis not present

## 2021-12-18 DIAGNOSIS — N1831 Chronic kidney disease, stage 3a: Secondary | ICD-10-CM | POA: Diagnosis not present

## 2021-12-18 DIAGNOSIS — I5032 Chronic diastolic (congestive) heart failure: Secondary | ICD-10-CM | POA: Diagnosis not present

## 2021-12-18 DIAGNOSIS — J45909 Unspecified asthma, uncomplicated: Secondary | ICD-10-CM | POA: Diagnosis not present

## 2021-12-18 NOTE — Telephone Encounter (Signed)
Patient is scheduled for 12/19/21 with Dr. Cherlynn Kaiser.

## 2021-12-19 ENCOUNTER — Encounter: Payer: Self-pay | Admitting: Family Medicine

## 2021-12-19 ENCOUNTER — Ambulatory Visit (INDEPENDENT_AMBULATORY_CARE_PROVIDER_SITE_OTHER): Payer: Medicare Other | Admitting: Family Medicine

## 2021-12-19 ENCOUNTER — Other Ambulatory Visit: Payer: Self-pay

## 2021-12-19 VITALS — BP 130/62 | HR 71 | Temp 97.6°F | Ht 71.0 in | Wt 216.0 lb

## 2021-12-19 DIAGNOSIS — J0110 Acute frontal sinusitis, unspecified: Secondary | ICD-10-CM

## 2021-12-19 DIAGNOSIS — N1831 Chronic kidney disease, stage 3a: Secondary | ICD-10-CM | POA: Diagnosis not present

## 2021-12-19 DIAGNOSIS — S72301D Unspecified fracture of shaft of right femur, subsequent encounter for closed fracture with routine healing: Secondary | ICD-10-CM | POA: Diagnosis not present

## 2021-12-19 DIAGNOSIS — M9701XD Periprosthetic fracture around internal prosthetic right hip joint, subsequent encounter: Secondary | ICD-10-CM | POA: Diagnosis not present

## 2021-12-19 DIAGNOSIS — J45909 Unspecified asthma, uncomplicated: Secondary | ICD-10-CM | POA: Diagnosis not present

## 2021-12-19 DIAGNOSIS — I130 Hypertensive heart and chronic kidney disease with heart failure and stage 1 through stage 4 chronic kidney disease, or unspecified chronic kidney disease: Secondary | ICD-10-CM | POA: Diagnosis not present

## 2021-12-19 DIAGNOSIS — I5032 Chronic diastolic (congestive) heart failure: Secondary | ICD-10-CM | POA: Diagnosis not present

## 2021-12-19 MED ORDER — DOXYCYCLINE HYCLATE 100 MG PO TABS: 100.0000 mg | ORAL_TABLET | Freq: Two times a day (BID) | ORAL | 0 refills | Status: AC

## 2021-12-19 NOTE — Patient Instructions (Signed)
Meds have been sent the the pharmacy You can take tylenol for pain/fevers If worsening symptoms, let us know or go to the Emergency room   Use flonase and nasal saline

## 2021-12-19 NOTE — Progress Notes (Signed)
Subjective:     Patient ID: Bruce Ball, male    DOB: 1946-11-03, 75 y.o.   MRN: [id number]  Chief Complaint  Patient presents with   Sinusitis    Started 1 month ago, getting worse recently, has had 3 rounds of antibiotics recently     HPI-here w/wife Pressure forehead, nose running/sneezing past sev months-was on 2 rounds of amox-didn't work and given doxy-helped some,  then admitted for fx femur/sugery and then rehab. This started some time in Oct after surgery.   No abx for over 2 months.  Thick congestion yellowish No f/c/cough.   No v/d.   Feels badly past few days.  Took advil cold and sinus last pm-which helps some.  Claritin/zyrtec not help. Has home health.  Health Maintenance Due  Topic Date Due   Fecal DNA (Cologuard)  Never done   Pneumonia Vaccine 6+ Years old (2 - PPSV23 if available, else PCV20) 05/20/2015   Zoster Vaccines- Shingrix (2 of 2) 01/23/2021    Past Medical History:  Diagnosis Date   Ankylosing spondylitis (HCC)    Arthritis of right knee    Basal cell carcinoma    Chronic back pain greater than 3 months duration    Chronic leg pain    CKD (chronic kidney disease), stage II    Coronary artery calcification seen on CAT scan 03/27/2021   Deafness in right ear    Dyspnea    cause by PE   Essential tremor    Extrinsic asthma, unspecified 04/07/2009   PT DENIES   GERD (gastroesophageal reflux disease)    Hearing loss    Hip problem    History of kidney stones    HYPERTENSION 10/29/2007   HYPOTENSION 08/13/2010   LOW BACK PAIN 04/21/2008   Mild aortic stenosis    NEPHROLITHIASIS, HX OF 04/21/2008   Neuropathy    Osteopenia    Other hyperlipidemia    Peptic ulcer    Pneumonia    PUD, HX OF 04/21/2008   Pulmonary emboli ([place]) 2021   noted on CT 12-2019   Renal disorder    Stage 3 chronic kidney disease (HCC)    Thyroid nodule     Past Surgical History:  Procedure Laterality Date   BACK SURGERY     bone cancer  1970   rt femur  removed and steel rod placed   CYSTOSCOPY WITH RETROGRADE PYELOGRAM, URETEROSCOPY AND STENT PLACEMENT Right 11/06/2017   Procedure: CYSTOSCOPY WITH  STENT PLACEMENT RIGHT;  Surgeon: Raynelle Bring, MD;  Location: [facility];  Service: Urology;  Laterality: Right;   CYSTOSCOPY WITH RETROGRADE PYELOGRAM, URETEROSCOPY AND STENT PLACEMENT Left 06/09/2019   Procedure: CYSTOSCOPY WITH RETROGRADE PYELOGRAM, URETEROSCOPY AND STENT PLACEMENT X2;  Surgeon: [name], MD;  Location: [facility];  Service: Urology;  Laterality: Left;   CYSTOSCOPY WITH RETROGRADE PYELOGRAM, URETEROSCOPY AND STENT PLACEMENT Bilateral 03/01/2020   Procedure: CYSTOSCOPY WITH RETROGRADE PYELOGRAM, URETEROSCOPY AND STENT PLACEMENT;  Surgeon: [name], MD;  Location: [facility];  Service: Urology;  Laterality: Bilateral;  75 MINS   CYSTOSCOPY/RETROGRADE/URETEROSCOPY     1 year ago at [place] in [place] Right 11/24/2017   Procedure: CYSTOSCOPY/URETEROSCOPY/ RETROGRADE/STENT REMOVAL;  Surgeon: Raynelle Bring, MD;  Location: [facility];  Service: Urology;  Laterality: Right;   HOLMIUM LASER APPLICATION Bilateral 08/02/6961   Procedure: HOLMIUM LASER APPLICATION;  Surgeon: [name], MD;  Location: [facility];  Service: Urology;  Laterality: Bilateral;   prosatectomy  Outpatient Medications Prior to Visit  Medication Sig Dispense Refill   acetaminophen (TYLENOL) 325 MG tablet Take 2 tablets (650 mg total) by mouth every 6 (six) hours as needed for mild pain or moderate pain.     apixaban (ELIQUIS) 2.5 MG TABS tablet Take 1 tablet (2.5 mg total) by mouth 2 (two) times daily.     atorvastatin (LIPITOR) 20 MG tablet Take 1 tablet (20 mg total) by mouth at bedtime. 90 tablet 3   Calcium Carb-Cholecalciferol 600-10 MG-MCG TABS Take by mouth.     carvedilol (COREG) 25 MG tablet Take 25 mg by mouth 2 (two) times daily.     Cholecalciferol (VITAMIN D3) 50 MCG (2000 UT) capsule Take 2,000 Units by mouth in  the morning and at bedtime.      Cholecalciferol 50 MCG (2000 UT) TABS Take by mouth.     DULoxetine (CYMBALTA) 30 MG capsule Take 30 mg by mouth daily.     ferrous sulfate 325 (65 FE) MG tablet Take 325 mg by mouth every Monday, Wednesday, and Friday.     furosemide (LASIX) 20 MG tablet Take by mouth.     gabapentin (NEURONTIN) 300 MG capsule Take 300 mg by mouth at bedtime.     indapamide (LOZOL) 1.25 MG tablet Take 1.25 mg by mouth daily.     metoCLOPramide (REGLAN) 5 MG tablet TAKE 1 TABLET (5 MG TOTAL) BY MOUTH 3 (THREE) TIMES DAILY BEFORE MEALS. 90 tablet 1   Multiple Vitamins-Minerals (MULTIVITAMIN WITH MINERALS) tablet Take 1 tablet by mouth daily.     pantoprazole (PROTONIX) 40 MG tablet Take 40 mg by mouth 2 (two) times daily before a meal.     potassium chloride (KLOR-CON) 10 MEQ tablet Take by mouth.     Propylene Glycol (SYSTANE BALANCE OP) Place 1 drop into both eyes in the morning and at bedtime.     No facility-administered medications prior to visit.    Allergies  Allergen Reactions   Lisinopril     Other reaction(s): Other (See Comments) Reaction unknown; says lowered blood pressure too much   WFU:[id number] except as noted in HPI      Objective:     BP 130/62    Pulse 71    Temp 97.6 F (36.4 C) (Temporal)    Ht 5\' 11"  (1.803 m)    Wt 216 lb (98 kg)    SpO2 98%    BMI 30.13 kg/m  Wt Readings from Last 3 Encounters:  12/19/21 216 lb (98 kg)  12/04/21 214 lb (97.1 kg)  10/10/21 238 lb (108 kg)        Gen: WDWN NAD wm in w/c HEENT: NCAT, conjunctiva not injected, sclera nonicteric TM WNL B, has hearing aids.OP moist, no exudates . Congested.  Sinuses nt to percussion NECK:  supple, no thyromegaly, no nodes, no carotid bruits CARDIAC: RRR, S1S2+, no murmur. LUNGS: CTAB. No wheezes ABDOMEN:  BS+, soft, NTND, No HSM, no masses EXT:  1+ edema MSK: no gross abnormalities.  NEURO: A&O x3.  CN II-XII intact.  PSYCH: normal mood. Good eye  contact  Assessment & Plan:   Problem List Items Addressed This Visit   None Visit Diagnoses     Subacute frontal sinusitis    -  Primary   Relevant Medications   doxycycline (VIBRA-TABS) 100 MG tablet     Sinusitis-getting frequently.  Use saline and flonase.  Doxy.  Had some chronic dz on CT head so stressed importance of flonase for  few wks.  If worse, not resolving, consider CT sinuses vs referral ENT  Meds ordered this encounter  Medications   doxycycline (VIBRA-TABS) 100 MG tablet    Sig: Take 1 tablet (100 mg total) by mouth 2 (two) times daily for 10 days.    Dispense:  20 tablet    Refill:  0    Wellington Hampshire., MD

## 2021-12-20 ENCOUNTER — Ambulatory Visit: Payer: BLUE CROSS/BLUE SHIELD | Admitting: Family Medicine

## 2021-12-23 ENCOUNTER — Encounter (HOSPITAL_BASED_OUTPATIENT_CLINIC_OR_DEPARTMENT_OTHER): Payer: Self-pay

## 2021-12-23 ENCOUNTER — Emergency Department (HOSPITAL_BASED_OUTPATIENT_CLINIC_OR_DEPARTMENT_OTHER): Payer: Medicare Other

## 2021-12-23 ENCOUNTER — Emergency Department (HOSPITAL_BASED_OUTPATIENT_CLINIC_OR_DEPARTMENT_OTHER): Payer: Medicare Other | Admitting: Radiology

## 2021-12-23 ENCOUNTER — Other Ambulatory Visit: Payer: Self-pay

## 2021-12-23 ENCOUNTER — Emergency Department (HOSPITAL_BASED_OUTPATIENT_CLINIC_OR_DEPARTMENT_OTHER)
Admission: EM | Admit: 2021-12-23 | Discharge: 2021-12-23 | Disposition: A | Discharge status: Home / Self Care | Payer: Medicare Other | Attending: Emergency Medicine | Admitting: Emergency Medicine

## 2021-12-23 DIAGNOSIS — M7989 Other specified soft tissue disorders: Secondary | ICD-10-CM | POA: Diagnosis not present

## 2021-12-23 DIAGNOSIS — Z87891 Personal history of nicotine dependence: Secondary | ICD-10-CM | POA: Diagnosis not present

## 2021-12-23 DIAGNOSIS — J45909 Unspecified asthma, uncomplicated: Secondary | ICD-10-CM | POA: Diagnosis not present

## 2021-12-23 DIAGNOSIS — X58XXXA Exposure to other specified factors, initial encounter: Secondary | ICD-10-CM | POA: Insufficient documentation

## 2021-12-23 DIAGNOSIS — Z79899 Other long term (current) drug therapy: Secondary | ICD-10-CM | POA: Diagnosis not present

## 2021-12-23 DIAGNOSIS — Z20822 Contact with and (suspected) exposure to covid-19: Secondary | ICD-10-CM | POA: Insufficient documentation

## 2021-12-23 DIAGNOSIS — Z96641 Presence of right artificial hip joint: Secondary | ICD-10-CM | POA: Diagnosis not present

## 2021-12-23 DIAGNOSIS — I130 Hypertensive heart and chronic kidney disease with heart failure and stage 1 through stage 4 chronic kidney disease, or unspecified chronic kidney disease: Secondary | ICD-10-CM | POA: Diagnosis not present

## 2021-12-23 DIAGNOSIS — M79661 Pain in right lower leg: Secondary | ICD-10-CM | POA: Insufficient documentation

## 2021-12-23 DIAGNOSIS — S2241XA Multiple fractures of ribs, right side, initial encounter for closed fracture: Secondary | ICD-10-CM | POA: Diagnosis not present

## 2021-12-23 DIAGNOSIS — Z8546 Personal history of malignant neoplasm of prostate: Secondary | ICD-10-CM | POA: Diagnosis not present

## 2021-12-23 DIAGNOSIS — Z7901 Long term (current) use of anticoagulants: Secondary | ICD-10-CM | POA: Insufficient documentation

## 2021-12-23 DIAGNOSIS — N183 Chronic kidney disease, stage 3 unspecified: Secondary | ICD-10-CM | POA: Insufficient documentation

## 2021-12-23 DIAGNOSIS — M549 Dorsalgia, unspecified: Secondary | ICD-10-CM | POA: Insufficient documentation

## 2021-12-23 DIAGNOSIS — I5032 Chronic diastolic (congestive) heart failure: Secondary | ICD-10-CM | POA: Diagnosis not present

## 2021-12-23 DIAGNOSIS — I2510 Atherosclerotic heart disease of native coronary artery without angina pectoris: Secondary | ICD-10-CM | POA: Insufficient documentation

## 2021-12-23 DIAGNOSIS — R0602 Shortness of breath: Secondary | ICD-10-CM | POA: Insufficient documentation

## 2021-12-23 DIAGNOSIS — S299XXA Unspecified injury of thorax, initial encounter: Secondary | ICD-10-CM | POA: Diagnosis present

## 2021-12-23 DIAGNOSIS — Z85828 Personal history of other malignant neoplasm of skin: Secondary | ICD-10-CM | POA: Insufficient documentation

## 2021-12-23 LAB — CBC WITH DIFFERENTIAL/PLATELET
Abs Immature Granulocytes: 0.02 10*3/uL (ref 0.00–0.07)
Basophils Absolute: 0.1 10*3/uL (ref 0.0–0.1)
Basophils Relative: 1 %
Eosinophils Absolute: 0.7 10*3/uL — ABNORMAL HIGH (ref 0.0–0.5)
Eosinophils Relative: 8 %
HCT: 40.3 % (ref 39.0–52.0)
Hemoglobin: 12.2 g/dL — ABNORMAL LOW (ref 13.0–17.0)
Immature Granulocytes: 0 %
Lymphocytes Relative: 21 %
Lymphs Abs: 1.9 10*3/uL (ref 0.7–4.0)
MCH: 24.7 pg — ABNORMAL LOW (ref 26.0–34.0)
MCHC: 30.3 g/dL (ref 30.0–36.0)
MCV: 81.7 fL (ref 80.0–100.0)
Monocytes Absolute: 0.5 10*3/uL (ref 0.1–1.0)
Monocytes Relative: 6 %
Neutro Abs: 5.8 10*3/uL (ref 1.7–7.7)
Neutrophils Relative %: 64 %
Platelets: 322 10*3/uL (ref 150–400)
RBC: 4.93 MIL/uL (ref 4.22–5.81)
RDW: 15.2 % (ref 11.5–15.5)
WBC: 9 10*3/uL (ref 4.0–10.5)
nRBC: 0 % (ref 0.0–0.2)

## 2021-12-23 LAB — BASIC METABOLIC PANEL
Anion gap: 9 (ref 5–15)
BUN: 23 mg/dL (ref 8–23)
CO2: 28 mmol/L (ref 22–32)
Calcium: 9.3 mg/dL (ref 8.9–10.3)
Chloride: 104 mmol/L (ref 98–111)
Creatinine, Ser: 1.35 mg/dL — ABNORMAL HIGH (ref 0.61–1.24)
GFR, Estimated: 55 mL/min — ABNORMAL LOW (ref >60)
Glucose, Bld: 100 mg/dL — ABNORMAL HIGH (ref 70–99)
Potassium: 3.5 mmol/L (ref 3.5–5.1)
Sodium: 141 mmol/L (ref 135–145)

## 2021-12-23 LAB — TROPONIN I (HIGH SENSITIVITY)
Troponin I (High Sensitivity): 7 ng/L (ref <18)
Troponin I (High Sensitivity): 7 ng/L (ref <18)

## 2021-12-23 LAB — D-DIMER, QUANTITATIVE: D-Dimer, Quant: 7.15 ug/mL-FEU — ABNORMAL HIGH (ref 0.00–0.50)

## 2021-12-23 LAB — RESP PANEL BY RT-PCR (FLU A&B, COVID) ARPGX2
Influenza A by PCR: NEGATIVE
Influenza B by PCR: NEGATIVE
SARS Coronavirus 2 by RT PCR: NEGATIVE

## 2021-12-23 LAB — BRAIN NATRIURETIC PEPTIDE: B Natriuretic Peptide: 30.1 pg/mL (ref 0.0–100.0)

## 2021-12-23 MED ORDER — IOHEXOL 350 MG/ML SOLN
100.0000 mL | Freq: Once | INTRAVENOUS | Status: AC | PRN
  Administered 2021-12-23: 13:00:00 80 mL via INTRAVENOUS

## 2021-12-23 MED ORDER — HEPARIN (PORCINE) 25000 UT/250ML-% IV SOLN
1700.0000 [IU]/h | INTRAVENOUS | Status: DC
Start: 2021-12-23 — End: 2021-12-23
  Administered 2021-12-23: 14:00:00 1700 [IU]/h via INTRAVENOUS
  Filled 2021-12-23: qty 250

## 2021-12-23 NOTE — ED Notes (Signed)
RT Note: Pt. seen in waiting room b/l b.s. slight diminishes/clear, recently in rehab @ [facility], non productive dry cough, family member @ side, P-80/Sats 100%,RR-20, on Eliquis.

## 2021-12-23 NOTE — Discharge Instructions (Addendum)
You were evaluated in the Emergency Department and after careful evaluation, we did not find any emergent condition requiring admission or further testing in the hospital.  Your exam/testing today was overall reassuring.  You had an extensive work-up of your shortness of breath in the emergency department today.  You do not have a white blood cell count to suggest infection.  Your CT imaging study did not reveal evidence of blood clot in your lungs, pneumonia in your lungs, ruptured lung or other abnormality.  It did show evidence of subacute rib fractures on the right which correlates with the pain that you are experiencing.  You could have exacerbated those rib fractures by lying on it overnight last night or reinjuring it somehow.  Recommend Tylenol and ibuprofen for pain control in the setting of your rib fractures.   Please return to the Emergency Department if you experience any worsening of your condition.  Thank you for allowing [name] to be a part of your care.

## 2021-12-23 NOTE — Progress Notes (Signed)
ANTICOAGULATION CONSULT NOTE - Initial Consult  Pharmacy Consult for IV heparin Indication: VTE treatment   Allergies  Allergen Reactions   Lisinopril     Other reaction(s): Other (See Comments) Reaction unknown; says lowered blood pressure too much    Patient Measurements: Height: 5\' 11"  (180.3 cm) Weight: 98 kg (216 lb) IBW/kg (Calculated) : 75.3 Heparin Dosing Weight: 95.3 kg  Vital Signs: Temp: 97.8 F (36.6 C) (12/25 1107) Temp Source: Temporal (12/25 1107) BP: 132/64 (12/25 1230) Pulse Rate: 66 (12/25 1230)  Labs: Recent Labs    12/23/21 1121  HGB 12.2*  HCT 40.3  PLT 322  CREATININE 1.35*  TROPONINIHS 7    Estimated Creatinine Clearance: 56.4 mL/min (A) (by C-G formula based on SCr of 1.35 mg/dL (H)).   Medical History: Past Medical History:  Diagnosis Date   Ankylosing spondylitis (HCC)    Arthritis of right knee    Basal cell carcinoma    Chronic back pain greater than 3 months duration    Chronic leg pain    CKD (chronic kidney disease), stage II    Coronary artery calcification seen on CAT scan 03/27/2021   Deafness in right ear    Dyspnea    cause by PE   Essential tremor    Extrinsic asthma, unspecified 04/07/2009   PT DENIES   GERD (gastroesophageal reflux disease)    Hearing loss    Hip problem    History of kidney stones    HYPERTENSION 10/29/2007   HYPOTENSION 08/13/2010   LOW BACK PAIN 04/21/2008   Mild aortic stenosis    NEPHROLITHIASIS, HX OF 04/21/2008   Neuropathy    Osteopenia    Other hyperlipidemia    Peptic ulcer    Pneumonia    PUD, HX OF 04/21/2008   Pulmonary emboli ([place]) 2021   noted on CT 12-2019   Renal disorder    Stage 3 chronic kidney disease (HCC)    Thyroid nodule     Assessment: 75 yo M presents for SOB and concern for DVT/PE. PTA apixaban 2.5mg  BID, LKD 12/25 @ 0830. Pharmacy consulted to start heparin.   Goal of Therapy:  Heparin level 0.3-0.7 units/ml aPTT 66-102 seconds Monitor platelets by  anticoagulation protocol: Yes   Plan:  Heparin 1700 units/hr (no bolus) 8 h heparin level and aPTT Daily aPTT, heparin level and CBC   Thank you for allowing pharmacy to participate in this patient's care.  Levonne Spiller, PharmD PGY1 Acute Care Resident  12/23/2021,2:19 PM

## 2021-12-23 NOTE — ED Triage Notes (Signed)
Pt presents with sudden onset SOB when getting out of the shower this morning. Recent hx of two pulmonary emboli. Anticoagulated on 2.5 mg BIG eliquis. Pt speaking in short sentences during triage due to dyspnea. Pt also c/o R sided back pain without known trauma.

## 2021-12-23 NOTE — ED Notes (Signed)
Patient transported to CT 

## 2021-12-23 NOTE — ED Notes (Signed)
US at bedside

## 2021-12-23 NOTE — ED Provider Notes (Signed)
[facility] EMERGENCY DEPT Provider Note   CSN: [id number] Arrival date & time: 12/23/21  1017     History Chief Complaint  Patient presents with   Shortness of Breath    Bruce Ball is a 75 y.o. male.   Shortness of Breath  75 year old male with medical history significant for revision of total hip arthroplasty on the right, chronic right lower extremity swelling, on Eliquis currently 2.5 mg twice daily who presents to the emergency department with sudden onset shortness of breath.  The patient states that he has had worsening swelling and had a couple of days of discomfort in his posterior calf a few days ago.  He woke up this morning with sudden onset shortness of breath while getting out of the shower this morning.  He denies any chest pain.  He denies any fevers or chills.  He does have a history of pulmonary embolism and states that his symptoms feel similar.  He endorses right back pain, worse with taking a deep breath.  Moderate in intensity, alleviated by shallow breaths.  Past Medical History:  Diagnosis Date   Ankylosing spondylitis ([place])    Arthritis of right knee    Basal cell carcinoma    Chronic back pain greater than 3 months duration    Chronic leg pain    CKD (chronic kidney disease), stage II    Coronary artery calcification seen on CAT scan 03/27/2021   Deafness in right ear    Dyspnea    cause by PE   Essential tremor    Extrinsic asthma, unspecified 04/07/2009   PT DENIES   GERD (gastroesophageal reflux disease)    Hearing loss    Hip problem    History of kidney stones    HYPERTENSION 10/29/2007   HYPOTENSION 08/13/2010   LOW BACK PAIN 04/21/2008   Mild aortic stenosis    NEPHROLITHIASIS, HX OF 04/21/2008   Neuropathy    Osteopenia    Other hyperlipidemia    Peptic ulcer    Pneumonia    PUD, HX OF 04/21/2008   Pulmonary emboli ([place]) 2021   noted on CT 12-2019   Renal disorder    Stage 3 chronic kidney disease (HCC)    Thyroid  nodule     Patient Active Problem List   Diagnosis Date Noted   History of pulmonary embolism 10/10/2021   HTN (hypertension) 10/10/2021   Chronic diastolic CHF (congestive heart failure) ([place]) 10/10/2021   GERD (gastroesophageal reflux disease) 10/10/2021   Ankylosing spondylitis ([place]) 10/10/2021   CKD (chronic kidney disease), stage III ([place]) 10/10/2021   Hyperlipidemia 10/10/2021   CAD (coronary artery disease) 10/10/2021   Periprosthetic fracture of shaft of femur 10/10/2021   Other fatigue 06/05/2021   CAD (coronary artery disease) 81/09/3158   Diastolic CHF ([place]) 45/85/9292   SOB (shortness of breath) on exertion 03/26/2021   Chronic anticoagulation 03/26/2021   Anemia in chronic kidney disease 01/25/2021   Acquired coagulation disorder ([place]) 12/12/2020   Senile purpura ([place]) 12/12/2020   S/P lumbar fusion 08/03/2020   Thin skin 07/25/2020   Aortic atherosclerosis ([place]) 05/17/2020   B12 deficiency 05/17/2020   Iliac artery aneurysm ([place]) 05/17/2020   Osteoporosis 04/04/2020   Idiopathic neuropathy 01/28/2020   Recurrent falls 01/28/2020   Chronic kidney disease (CKD), stage III (moderate) ([place]) 01/19/2020   Pulmonary emboli ([place]) 01/14/2020   GERD (gastroesophageal reflux disease) 12/22/2019   Essential tremor 12/22/2019   Femoral condyle fracture ([place]) 07/15/2019   Gross hematuria  06/14/2019   Hyperlipidemia 11/11/2017   Kidney stone 11/05/2017   Community acquired pneumonia of right upper lobe of lung 11/05/2017   Right ureteral stone 11/05/2017   History of total right hip replacement 03/08/2017   History of prostate cancer 03/08/2017   Personal history of prostate cancer 02/28/2017   High risk medication use 02/28/2017   DDD (degenerative disc disease), thoracic 02/28/2017   Ataxia 05/18/2016   Vestibular disequilibrium 05/18/2016   Ankylosing spondylitis of cervical region ([place]) 05/24/2014   Lung nodule, solitary 05/24/2014   Lumbosacral stenosis 06/05/2013    Peripheral edema 06/08/2012   Subcortical microvascular ischemic occlusive disease 08/13/2010   Extrinsic asthma 04/07/2009   Backache 04/21/2008   History of peptic ulcer disease 04/21/2008   Essential hypertension 10/29/2007    Past Surgical History:  Procedure Laterality Date   BACK SURGERY     bone cancer  1970   rt femur removed and steel rod placed   CYSTOSCOPY WITH RETROGRADE PYELOGRAM, URETEROSCOPY AND STENT PLACEMENT Right 11/06/2017   Procedure: CYSTOSCOPY WITH  STENT PLACEMENT RIGHT;  Surgeon: Raynelle Bring, MD;  Location: [facility];  Service: Urology;  Laterality: Right;   CYSTOSCOPY WITH RETROGRADE PYELOGRAM, URETEROSCOPY AND STENT PLACEMENT Left 06/09/2019   Procedure: CYSTOSCOPY WITH RETROGRADE PYELOGRAM, URETEROSCOPY AND STENT PLACEMENT X2;  Surgeon: [name], MD;  Location: [facility];  Service: Urology;  Laterality: Left;   CYSTOSCOPY WITH RETROGRADE PYELOGRAM, URETEROSCOPY AND STENT PLACEMENT Bilateral 03/01/2020   Procedure: CYSTOSCOPY WITH RETROGRADE PYELOGRAM, URETEROSCOPY AND STENT PLACEMENT;  Surgeon: [name], MD;  Location: [facility];  Service: Urology;  Laterality: Bilateral;  75 MINS   CYSTOSCOPY/RETROGRADE/URETEROSCOPY     1 year ago at [facility] in [place] Right 11/24/2017   Procedure: CYSTOSCOPY/URETEROSCOPY/ RETROGRADE/STENT REMOVAL;  Surgeon: Raynelle Bring, MD;  Location: [facility];  Service: Urology;  Laterality: Right;   HOLMIUM LASER APPLICATION Bilateral 07/01/4192   Procedure: HOLMIUM LASER APPLICATION;  Surgeon: [name], MD;  Location: [facility];  Service: Urology;  Laterality: Bilateral;   prosatectomy         Family History  Problem Relation Age of Onset   COPD Father    Hypertension Father    Prostate cancer Brother    Stomach cancer Brother        on chemo in 2022- in 85s developed   Depression Brother        suicide after brain stem injury after accident   Stroke Brother        65 years old    Esophageal cancer Neg Hx    Pancreatic cancer Neg Hx    Colon cancer Neg Hx     Social History   Tobacco Use   Smoking status: Former    Packs/day: 1.00    Years: 17.00    Pack years: 17.00    Types: Cigarettes    Start date: 1963    Quit date: 12/30/1978    Years since quitting: 43.0   Smokeless tobacco: Never  Vaping Use   Vaping Use: Never used  Substance Use Topics   Alcohol use: No   Drug use: No    Home Medications Prior to Admission medications   Medication Sig Start Date End Date Taking? Authorizing Provider  acetaminophen (TYLENOL) 325 MG tablet Take 2 tablets (650 mg total) by mouth every 6 (six) hours as needed for mild pain or moderate pain. 03/29/21  Yes Hongalgi, Lenis Dickinson, MD  apixaban (ELIQUIS) 2.5 MG TABS tablet Take 1  tablet (2.5 mg total) by mouth 2 (two) times daily. 01/25/21  Yes Gorsuch, Ni, MD  atorvastatin (LIPITOR) 20 MG tablet Take 1 tablet (20 mg total) by mouth at bedtime. 07/19/21  Yes Turner, Eber Hong, MD  Calcium Carb-Cholecalciferol 600-10 MG-MCG TABS Take 1 tablet by mouth 2 (two) times daily. 10/22/21 10/22/22 Yes [provider]  carvedilol (COREG) 25 MG tablet Take 12.5 mg by mouth 2 (two) times daily.   Yes [provider]  Cholecalciferol (VITAMIN D3) 50 MCG (2000 UT) capsule Take 2,000 Units by mouth in the morning and at bedtime.    Yes [provider]  doxycycline (VIBRA-TABS) 100 MG tablet Take 1 tablet (100 mg total) by mouth 2 (two) times daily for 10 days. 12/19/21 12/29/21 Yes Tawnya Crook, MD  DULoxetine (CYMBALTA) 30 MG capsule Take 30 mg by mouth daily.   Yes [provider]  ferrous sulfate 325 (65 FE) MG tablet Take 325 mg by mouth every Monday, Wednesday, and Friday.   Yes [provider]  fluticasone (FLONASE) 50 MCG/ACT nasal spray Place 1-2 sprays into both nostrils daily as needed for allergies or rhinitis.   Yes [provider]  gabapentin (NEURONTIN) 100 MG capsule Take  100 mg by mouth every morning.   Yes [provider]  gabapentin (NEURONTIN) 300 MG capsule Take 300 mg by mouth at bedtime.   Yes [provider]  ibuprofen (ADVIL) 200 MG tablet Take 600 mg by mouth every 6 (six) hours as needed for fever, headache or mild pain.   Yes [provider]  indapamide (LOZOL) 1.25 MG tablet Take 1.25 mg by mouth daily. 03/09/20  Yes [provider]  metoCLOPramide (REGLAN) 5 MG tablet TAKE 1 TABLET (5 MG TOTAL) BY MOUTH 3 (THREE) TIMES DAILY BEFORE MEALS. 04/24/20  Yes Armbruster, Carlota Raspberry, MD  Multiple Vitamins-Minerals (MULTIVITAMIN WITH MINERALS) tablet Take 1 tablet by mouth daily.   Yes [provider]  pantoprazole (PROTONIX) 40 MG tablet Take 40 mg by mouth 2 (two) times daily before a meal.   Yes [provider]    Allergies    Lisinopril  Review of Systems   Review of Systems  Respiratory:  Positive for shortness of breath.   Cardiovascular:  Positive for leg swelling.  All other systems reviewed and are negative.  Physical Exam Updated Vital Signs BP 137/69    Pulse 69    Temp 98.3 F (36.8 C) (Oral)    Resp 12    Ht 5\' 11"  (1.803 m)    Wt 98 kg    SpO2 97%    BMI 30.13 kg/m   Physical Exam Vitals and nursing note reviewed.  Constitutional:      General: He is not in acute distress.    Appearance: He is well-developed.  HENT:     Head: Normocephalic and atraumatic.  Eyes:     Conjunctiva/sclera: Conjunctivae normal.     Pupils: Pupils are equal, round, and reactive to light.  Cardiovascular:     Rate and Rhythm: Normal rate and regular rhythm.     Heart sounds: No murmur heard. Pulmonary:     Effort: Pulmonary effort is normal. No respiratory distress.     Breath sounds: Normal breath sounds.  Abdominal:     General: There is no distension.     Palpations: Abdomen is soft.     Tenderness: There is no abdominal tenderness. There is no guarding.  Musculoskeletal:  General: No  swelling, deformity or signs of injury.     Cervical back: Neck supple.  Skin:    General: Skin is warm and dry.     Capillary Refill: Capillary refill takes less than 2 seconds.     Findings: No lesion or rash.  Neurological:     General: No focal deficit present.     Mental Status: He is alert. Mental status is at baseline.  Psychiatric:        Mood and Affect: Mood normal.    ED Results / Procedures / Treatments   Labs (all labs ordered are listed, but only abnormal results are displayed) Labs Reviewed  BASIC METABOLIC PANEL - Abnormal; Notable for the following components:      Result Value   Glucose, Bld 100 (*)    Creatinine, Ser 1.35 (*)    GFR, Estimated 55 (*)    All other components within normal limits  CBC WITH DIFFERENTIAL/PLATELET - Abnormal; Notable for the following components:   Hemoglobin 12.2 (*)    MCH 24.7 (*)    Eosinophils Absolute 0.7 (*)    All other components within normal limits  D-DIMER, QUANTITATIVE - Abnormal; Notable for the following components:   D-Dimer, Quant 7.15 (*)    All other components within normal limits  RESP PANEL BY RT-PCR (FLU A&B, COVID) [id number]  BRAIN NATRIURETIC PEPTIDE  TROPONIN I (HIGH SENSITIVITY)  TROPONIN I (HIGH SENSITIVITY)    EKG EKG Interpretation  Date/Time:  Sunday December 23 2021 11:11:09 EST Ventricular Rate:  72 PR Interval:    QRS Duration: 78 QT Interval:  378 QTC Calculation: 413 R Axis:   45 Text Interpretation: Sinus rhythm Prolonged PR Abnormal ECG Reconfirmed by Regan Lemming ([id number]) on 12/23/2021 11:27:22 AM  Radiology DG Chest 2 View  Result Date: 12/23/2021 CLINICAL DATA:  Short of breath EXAM: CHEST - 2 VIEW COMPARISON:  None. FINDINGS: Normal mediastinum and cardiac silhouette. Normal pulmonary vasculature. No evidence of effusion, infiltrate, or pneumothorax. No acute bony abnormality. IMPRESSION: No acute cardiopulmonary process. Electronically Signed   By: Suzy Bouchard M.D.   On:  12/23/2021 11:39   CT Angio Chest PE W and/or Wo Contrast  Result Date: 12/23/2021 CLINICAL DATA:  Sudden onset shortness of breath EXAM: CT ANGIOGRAPHY CHEST WITH CONTRAST TECHNIQUE: Multidetector CT imaging of the chest was performed using the standard protocol during bolus administration of intravenous contrast. Multiplanar CT image reconstructions and MIPs were obtained to evaluate the vascular anatomy. CONTRAST:  94mL OMNIPAQUE IOHEXOL 350 MG/ML SOLN COMPARISON:  03/26/2021 FINDINGS: Cardiovascular: The heart size is normal. No substantial pericardial effusion. Coronary artery calcification is evident. Mild atherosclerotic calcification is noted in the wall of the thoracic aorta. There is no filling defect within the opacified pulmonary arteries to suggest the presence of an acute pulmonary embolus. Mediastinum/Nodes: No mediastinal lymphadenopathy. There is no hilar lymphadenopathy. The esophagus has normal imaging features. There is no axillary lymphadenopathy. Lungs/Pleura: No suspicious pulmonary nodule or mass. No focal airspace consolidation. No pleural effusion. Calcified left upper lobe granuloma again noted. Upper Abdomen: Innumerable tiny hypoattenuating liver lesions again noted, too small to characterize but stable in the inner and likely benign. Again noted. Musculoskeletal: No worrisome lytic or sclerotic osseous abnormality. Multiple nonacute right rib fractures evident. Review of the MIP images confirms the above findings. IMPRESSION: 1. No CT evidence for acute pulmonary embolus. 2. No acute findings in the chest. 3. Aortic Atherosclerosis (ICD10-I70.0). Electronically Signed   By: Misty Stanley  M.D.   On: 12/23/2021 14:06   US Venous Img Lower Unilateral Right  Result Date: 12/23/2021 CLINICAL DATA:  Right lower extremity pain.  Evaluate for DVT. EXAM: RIGHT LOWER EXTREMITY VENOUS DOPPLER ULTRASOUND TECHNIQUE: Gray-scale sonography with graded compression, as well as color Doppler  and duplex ultrasound were performed to evaluate the lower extremity deep venous systems from the level of the common femoral vein and including the common femoral, femoral, profunda femoral, popliteal and calf veins including the posterior tibial, peroneal and gastrocnemius veins when visible. The superficial great saphenous vein was also interrogated. Spectral Doppler was utilized to evaluate flow at rest and with distal augmentation maneuvers in the common femoral, femoral and popliteal veins. COMPARISON:  None. FINDINGS: Contralateral Common Femoral Vein: Respiratory phasicity is normal and symmetric with the symptomatic side. No evidence of thrombus. Normal compressibility. Common Femoral Vein: No evidence of thrombus. Normal compressibility, respiratory phasicity and response to augmentation. Saphenofemoral Junction: No evidence of thrombus. Normal compressibility and flow on color Doppler imaging. Profunda Femoral Vein: No evidence of thrombus. Normal compressibility and flow on color Doppler imaging. Femoral Vein: No evidence of thrombus. Normal compressibility, respiratory phasicity and response to augmentation. Popliteal Vein: No evidence of thrombus. Normal compressibility, respiratory phasicity and response to augmentation. Calf Veins: No evidence of thrombus. Normal compressibility and flow on color Doppler imaging. Superficial Great Saphenous Vein: No evidence of thrombus. Normal compressibility. Venous Reflux:  None. Other Findings:  None. IMPRESSION: No evidence of DVT within the right lower extremity. Electronically Signed   By: Sandi Mariscal M.D.   On: 12/23/2021 13:23    Procedures Procedures   Medications Ordered in ED Medications  iohexol (OMNIPAQUE) 350 MG/ML injection 100 mL (80 mLs Intravenous Contrast Given 12/23/21 1322)    ED Course  I have reviewed the triage vital signs and the nursing notes.  Pertinent labs & imaging results that were available during my care of the patient  were reviewed by me and considered in my medical decision making (see chart for details).    MDM Rules/Calculators/A&P                          75 year old male with medical history significant for revision of total hip arthroplasty on the right, chronic right lower extremity swelling, on Eliquis currently 2.5 mg twice daily who presents to the emergency department with sudden onset shortness of breath.  The patient states that he has had worsening swelling and had a couple of days of discomfort in his posterior calf a few days ago.  He woke up this morning with sudden onset shortness of breath while getting out of the shower this morning.  He denies any chest pain.  He denies any fevers or chills.  He does have a history of pulmonary embolism and states that his symptoms feel similar.  He endorses right back pain.  On arrival, the patient was afebrile, not tachycardic or tachypneic, normotensive, saturating well on room air.  The patient presents with a story concerning for possible DVT and now PE despite he is 2.5 mg twice daily Eliquis use.  He did take his medicine this morning.  Pharmacy was consulted for empiric coverage for PE with heparin based on patient's initial presentation and recommended no bolus, initiated the patient on a heparin gtt. which was started.  An EKG revealed no ST segment changes or T wave inversions, sinus rhythm with ventricular rate 72, prolonged PR present, otherwise unremarkable.  A chest x-ray revealed no acute abnormality.  Ultrasound of the right lower extremity did not reveal evidence of DVT.  Troponins x2 were collected and resulted normal, COVID-19 and influenza PCR testing also was negative for COVID and flu and a BNP was normal.  Low suspicion for pericarditis/myocarditis at this time based on EKG and presenting symptoms and physical exam, no evidence for DVT, no evidence for pneumonia or pneumothorax, low suspicion for ACS.  The patient underwent CTA imaging which  did not reveal evidence of PE or other acute abnormality.  Did show evidence of old healing subacute rib fractures on the right.  On reassessment, the patient did have tenderness palpation about the right posterior ribs with sharp tenderness along the bony prominence of his ribs.  Given his CT findings, acute shortness of breath that is worse with deep inspiration, favor likely exacerbation of subacute rib fractures at this time.  His heparin GTT was stopped.  No other evidence of acute abnormality at this time.  Patient was counseled on pain control, deep breathing exercises to ensure minimal atelectasis.  Overall stable for discharge at this time.   Final Clinical Impression(s) / ED Diagnoses Final diagnoses:  Closed fracture of multiple ribs of right side, initial encounter  Shortness of breath    Rx / DC Orders ED Discharge Orders     None        Regan Lemming, MD 12/25/21 1127

## 2021-12-25 DIAGNOSIS — J45909 Unspecified asthma, uncomplicated: Secondary | ICD-10-CM | POA: Diagnosis not present

## 2021-12-25 DIAGNOSIS — I130 Hypertensive heart and chronic kidney disease with heart failure and stage 1 through stage 4 chronic kidney disease, or unspecified chronic kidney disease: Secondary | ICD-10-CM | POA: Diagnosis not present

## 2021-12-25 DIAGNOSIS — I5032 Chronic diastolic (congestive) heart failure: Secondary | ICD-10-CM | POA: Diagnosis not present

## 2021-12-25 DIAGNOSIS — N1831 Chronic kidney disease, stage 3a: Secondary | ICD-10-CM | POA: Diagnosis not present

## 2021-12-25 DIAGNOSIS — S72301D Unspecified fracture of shaft of right femur, subsequent encounter for closed fracture with routine healing: Secondary | ICD-10-CM | POA: Diagnosis not present

## 2021-12-25 DIAGNOSIS — M9701XD Periprosthetic fracture around internal prosthetic right hip joint, subsequent encounter: Secondary | ICD-10-CM | POA: Diagnosis not present

## 2021-12-27 DIAGNOSIS — J45909 Unspecified asthma, uncomplicated: Secondary | ICD-10-CM | POA: Diagnosis not present

## 2021-12-27 DIAGNOSIS — N1831 Chronic kidney disease, stage 3a: Secondary | ICD-10-CM | POA: Diagnosis not present

## 2021-12-27 DIAGNOSIS — I130 Hypertensive heart and chronic kidney disease with heart failure and stage 1 through stage 4 chronic kidney disease, or unspecified chronic kidney disease: Secondary | ICD-10-CM | POA: Diagnosis not present

## 2021-12-27 DIAGNOSIS — M9701XD Periprosthetic fracture around internal prosthetic right hip joint, subsequent encounter: Secondary | ICD-10-CM | POA: Diagnosis not present

## 2021-12-27 DIAGNOSIS — S72301D Unspecified fracture of shaft of right femur, subsequent encounter for closed fracture with routine healing: Secondary | ICD-10-CM | POA: Diagnosis not present

## 2021-12-27 DIAGNOSIS — I5032 Chronic diastolic (congestive) heart failure: Secondary | ICD-10-CM | POA: Diagnosis not present

## 2022-01-01 DIAGNOSIS — M5416 Radiculopathy, lumbar region: Secondary | ICD-10-CM | POA: Diagnosis not present

## 2022-01-01 DIAGNOSIS — Z6829 Body mass index (BMI) 29.0-29.9, adult: Secondary | ICD-10-CM | POA: Diagnosis not present

## 2022-01-01 DIAGNOSIS — S72301D Unspecified fracture of shaft of right femur, subsequent encounter for closed fracture with routine healing: Secondary | ICD-10-CM | POA: Diagnosis not present

## 2022-01-01 DIAGNOSIS — K219 Gastro-esophageal reflux disease without esophagitis: Secondary | ICD-10-CM | POA: Diagnosis not present

## 2022-01-01 DIAGNOSIS — Z87891 Personal history of nicotine dependence: Secondary | ICD-10-CM | POA: Diagnosis not present

## 2022-01-01 DIAGNOSIS — I5032 Chronic diastolic (congestive) heart failure: Secondary | ICD-10-CM | POA: Diagnosis not present

## 2022-01-01 DIAGNOSIS — E785 Hyperlipidemia, unspecified: Secondary | ICD-10-CM | POA: Diagnosis not present

## 2022-01-01 DIAGNOSIS — M9701XD Periprosthetic fracture around internal prosthetic right hip joint, subsequent encounter: Secondary | ICD-10-CM | POA: Diagnosis not present

## 2022-01-01 DIAGNOSIS — Z7901 Long term (current) use of anticoagulants: Secondary | ICD-10-CM | POA: Diagnosis not present

## 2022-01-01 DIAGNOSIS — Z87442 Personal history of urinary calculi: Secondary | ICD-10-CM | POA: Diagnosis not present

## 2022-01-01 DIAGNOSIS — Z86711 Personal history of pulmonary embolism: Secondary | ICD-10-CM | POA: Diagnosis not present

## 2022-01-01 DIAGNOSIS — Z8546 Personal history of malignant neoplasm of prostate: Secondary | ICD-10-CM | POA: Diagnosis not present

## 2022-01-01 DIAGNOSIS — I130 Hypertensive heart and chronic kidney disease with heart failure and stage 1 through stage 4 chronic kidney disease, or unspecified chronic kidney disease: Secondary | ICD-10-CM | POA: Diagnosis not present

## 2022-01-01 DIAGNOSIS — E669 Obesity, unspecified: Secondary | ICD-10-CM | POA: Diagnosis not present

## 2022-01-01 DIAGNOSIS — J45909 Unspecified asthma, uncomplicated: Secondary | ICD-10-CM | POA: Diagnosis not present

## 2022-01-01 DIAGNOSIS — I2510 Atherosclerotic heart disease of native coronary artery without angina pectoris: Secondary | ICD-10-CM | POA: Diagnosis not present

## 2022-01-01 DIAGNOSIS — N1831 Chronic kidney disease, stage 3a: Secondary | ICD-10-CM | POA: Diagnosis not present

## 2022-01-01 DIAGNOSIS — D62 Acute posthemorrhagic anemia: Secondary | ICD-10-CM | POA: Diagnosis not present

## 2022-01-01 DIAGNOSIS — G9340 Encephalopathy, unspecified: Secondary | ICD-10-CM | POA: Diagnosis not present

## 2022-01-03 ENCOUNTER — Encounter: Payer: Self-pay | Admitting: Family Medicine

## 2022-01-03 DIAGNOSIS — J45909 Unspecified asthma, uncomplicated: Secondary | ICD-10-CM | POA: Diagnosis not present

## 2022-01-03 DIAGNOSIS — M9701XD Periprosthetic fracture around internal prosthetic right hip joint, subsequent encounter: Secondary | ICD-10-CM | POA: Diagnosis not present

## 2022-01-03 DIAGNOSIS — N1831 Chronic kidney disease, stage 3a: Secondary | ICD-10-CM | POA: Diagnosis not present

## 2022-01-03 DIAGNOSIS — I130 Hypertensive heart and chronic kidney disease with heart failure and stage 1 through stage 4 chronic kidney disease, or unspecified chronic kidney disease: Secondary | ICD-10-CM | POA: Diagnosis not present

## 2022-01-03 DIAGNOSIS — I5032 Chronic diastolic (congestive) heart failure: Secondary | ICD-10-CM | POA: Diagnosis not present

## 2022-01-03 DIAGNOSIS — S72301D Unspecified fracture of shaft of right femur, subsequent encounter for closed fracture with routine healing: Secondary | ICD-10-CM | POA: Diagnosis not present

## 2022-01-04 ENCOUNTER — Other Ambulatory Visit: Payer: Self-pay

## 2022-01-04 DIAGNOSIS — J45909 Unspecified asthma, uncomplicated: Secondary | ICD-10-CM | POA: Diagnosis not present

## 2022-01-04 DIAGNOSIS — S72301D Unspecified fracture of shaft of right femur, subsequent encounter for closed fracture with routine healing: Secondary | ICD-10-CM | POA: Diagnosis not present

## 2022-01-04 DIAGNOSIS — I5032 Chronic diastolic (congestive) heart failure: Secondary | ICD-10-CM | POA: Diagnosis not present

## 2022-01-04 DIAGNOSIS — I130 Hypertensive heart and chronic kidney disease with heart failure and stage 1 through stage 4 chronic kidney disease, or unspecified chronic kidney disease: Secondary | ICD-10-CM | POA: Diagnosis not present

## 2022-01-04 DIAGNOSIS — M9701XD Periprosthetic fracture around internal prosthetic right hip joint, subsequent encounter: Secondary | ICD-10-CM | POA: Diagnosis not present

## 2022-01-04 DIAGNOSIS — N1831 Chronic kidney disease, stage 3a: Secondary | ICD-10-CM | POA: Diagnosis not present

## 2022-01-04 MED ORDER — TRAMADOL HCL 50 MG PO TABS: 25.0000 mg | ORAL_TABLET | Freq: Three times a day (TID) | ORAL | 0 refills | Status: DC | PRN

## 2022-01-07 DIAGNOSIS — I130 Hypertensive heart and chronic kidney disease with heart failure and stage 1 through stage 4 chronic kidney disease, or unspecified chronic kidney disease: Secondary | ICD-10-CM | POA: Diagnosis not present

## 2022-01-07 DIAGNOSIS — J45909 Unspecified asthma, uncomplicated: Secondary | ICD-10-CM | POA: Diagnosis not present

## 2022-01-07 DIAGNOSIS — S72301D Unspecified fracture of shaft of right femur, subsequent encounter for closed fracture with routine healing: Secondary | ICD-10-CM | POA: Diagnosis not present

## 2022-01-07 DIAGNOSIS — I5032 Chronic diastolic (congestive) heart failure: Secondary | ICD-10-CM | POA: Diagnosis not present

## 2022-01-07 DIAGNOSIS — N1831 Chronic kidney disease, stage 3a: Secondary | ICD-10-CM | POA: Diagnosis not present

## 2022-01-07 DIAGNOSIS — I2699 Other pulmonary embolism without acute cor pulmonale: Secondary | ICD-10-CM | POA: Diagnosis not present

## 2022-01-07 DIAGNOSIS — M9701XD Periprosthetic fracture around internal prosthetic right hip joint, subsequent encounter: Secondary | ICD-10-CM | POA: Diagnosis not present

## 2022-01-08 DIAGNOSIS — M7061 Trochanteric bursitis, right hip: Secondary | ICD-10-CM | POA: Diagnosis not present

## 2022-01-08 DIAGNOSIS — Z471 Aftercare following joint replacement surgery: Secondary | ICD-10-CM | POA: Diagnosis not present

## 2022-01-08 DIAGNOSIS — G8918 Other acute postprocedural pain: Secondary | ICD-10-CM | POA: Diagnosis not present

## 2022-01-08 DIAGNOSIS — Z96641 Presence of right artificial hip joint: Secondary | ICD-10-CM | POA: Diagnosis not present

## 2022-01-08 DIAGNOSIS — M978XXA Periprosthetic fracture around other internal prosthetic joint, initial encounter: Secondary | ICD-10-CM | POA: Diagnosis not present

## 2022-01-08 DIAGNOSIS — Z96649 Presence of unspecified artificial hip joint: Secondary | ICD-10-CM | POA: Diagnosis not present

## 2022-01-10 DIAGNOSIS — I130 Hypertensive heart and chronic kidney disease with heart failure and stage 1 through stage 4 chronic kidney disease, or unspecified chronic kidney disease: Secondary | ICD-10-CM | POA: Diagnosis not present

## 2022-01-10 DIAGNOSIS — N1831 Chronic kidney disease, stage 3a: Secondary | ICD-10-CM | POA: Diagnosis not present

## 2022-01-10 DIAGNOSIS — I5032 Chronic diastolic (congestive) heart failure: Secondary | ICD-10-CM | POA: Diagnosis not present

## 2022-01-10 DIAGNOSIS — M9701XD Periprosthetic fracture around internal prosthetic right hip joint, subsequent encounter: Secondary | ICD-10-CM | POA: Diagnosis not present

## 2022-01-10 DIAGNOSIS — J45909 Unspecified asthma, uncomplicated: Secondary | ICD-10-CM | POA: Diagnosis not present

## 2022-01-10 DIAGNOSIS — S72301D Unspecified fracture of shaft of right femur, subsequent encounter for closed fracture with routine healing: Secondary | ICD-10-CM | POA: Diagnosis not present

## 2022-01-11 DIAGNOSIS — I130 Hypertensive heart and chronic kidney disease with heart failure and stage 1 through stage 4 chronic kidney disease, or unspecified chronic kidney disease: Secondary | ICD-10-CM | POA: Diagnosis not present

## 2022-01-11 DIAGNOSIS — S72301D Unspecified fracture of shaft of right femur, subsequent encounter for closed fracture with routine healing: Secondary | ICD-10-CM | POA: Diagnosis not present

## 2022-01-11 DIAGNOSIS — N1831 Chronic kidney disease, stage 3a: Secondary | ICD-10-CM | POA: Diagnosis not present

## 2022-01-11 DIAGNOSIS — I5032 Chronic diastolic (congestive) heart failure: Secondary | ICD-10-CM | POA: Diagnosis not present

## 2022-01-11 DIAGNOSIS — M9701XD Periprosthetic fracture around internal prosthetic right hip joint, subsequent encounter: Secondary | ICD-10-CM | POA: Diagnosis not present

## 2022-01-11 DIAGNOSIS — J45909 Unspecified asthma, uncomplicated: Secondary | ICD-10-CM | POA: Diagnosis not present

## 2022-01-14 ENCOUNTER — Telehealth: Payer: Self-pay | Admitting: Pharmacist

## 2022-01-14 DIAGNOSIS — S72301D Unspecified fracture of shaft of right femur, subsequent encounter for closed fracture with routine healing: Secondary | ICD-10-CM | POA: Diagnosis not present

## 2022-01-14 DIAGNOSIS — I130 Hypertensive heart and chronic kidney disease with heart failure and stage 1 through stage 4 chronic kidney disease, or unspecified chronic kidney disease: Secondary | ICD-10-CM | POA: Diagnosis not present

## 2022-01-14 DIAGNOSIS — J45909 Unspecified asthma, uncomplicated: Secondary | ICD-10-CM | POA: Diagnosis not present

## 2022-01-14 DIAGNOSIS — N1831 Chronic kidney disease, stage 3a: Secondary | ICD-10-CM | POA: Diagnosis not present

## 2022-01-14 DIAGNOSIS — M9701XD Periprosthetic fracture around internal prosthetic right hip joint, subsequent encounter: Secondary | ICD-10-CM | POA: Diagnosis not present

## 2022-01-14 DIAGNOSIS — I5032 Chronic diastolic (congestive) heart failure: Secondary | ICD-10-CM | POA: Diagnosis not present

## 2022-01-14 NOTE — Chronic Care Management (AMB) (Signed)
Chronic Care Management Pharmacy Assistant   Name: Bruce Ball  MRN: [id number] DOB: 08/08/1946   Reason for Encounter: Hypertension Adherence Call    Recent office visits:  12/19/2021 Trudee Kuster, MD; Rx Doxycyline for subacute frontal sinusitis.  12/04/2021 [facility] (PCP) [name], MD; no medication changes indicated.  09/18/2021 Mertie Clause, PA; trial doxy as alternative abx, referral to ENT  Recent consult visits:  01/08/2022 [facility] (Oncology) Waneta Martins, NP; no medication changes indicated.  10/01/2021 [facility] (Rheumatology) Bo Merino, MD; no medication changes indicated.  Hospital visits:  12/23/2021 ED Visit for Closed Fracture of multiple ribs of right side No medication changes indicated.  10/14/2021 Admission - [place] -Revision of Total Femor Arthroplasty  START taking these medications acetaminophen (TYLENOL) 500 MG tablet Take 2 tablets (1,000 mg total) by mouth 3 (three) times a day for 10 days Qty: 60 tablet, Refills: 0   calcium carbonate-vitamin D3 (CALTRATE 600+D) 600 mg-10 mcg (400 unit) tablet Take 1 tablet by mouth daily with lunch Qty: 30 tablet, Refills: 11   lidocaine (LIDODERM) 5 % patch Place 1 patch onto the skin daily for 7 days Apply patch to the most painful area for up to 12 hours in a 24 hour period. Qty: 7 patch, Refills: 0   polyethylene glycol (MIRALAX) packet Take 1 packet (17 g total) by mouth once daily for 7 days Mix in 4-8ounces of fluid prior to taking. Qty: 7 packet, Refills: 0   sennosides-docusate (SENOKOT-S) 8.6-50 mg tablet Take 2 tablets by mouth 2 (two) times daily for 7 days Qty: 28 tablet, Refills: 0   CONTINUE these medications which have CHANGED   !! gabapentin (NEURONTIN) 100 MG capsule Take 1 capsule (100 mg total) by mouth 2 (two) times daily for 30 days Qty: 60 capsule, Refills: 0   !! gabapentin (NEURONTIN) 300 MG capsule Take 1 capsule (300 mg  total) by mouth at bedtime for 30 days Qty: 30 capsule, Refills: 0   HYDROmorphone (DILAUDID) 2 MG tablet Take 0.5-1 tablets (1-2 mg total) by mouth every 6 (six) hours as needed for Pain for up to 7 days Qty: 10 tablet, Refills: 0   metoclopramide (REGLAN) 5 MG tablet Take 1 tablet (5 mg total) by mouth at bedtime Refills: 0   10/10/2021 ED to Hospital Admission due to Fall -Robaxin, oxycodone and Dilaudid as needed based on pain severity -Hold home Eliquis -Subcu heparin every 8 hours -IV Protonix 40 mg twice daily -Add Carafate and simethicone per family request  Medications: Outpatient Encounter Medications as of 01/14/2022  Medication Sig   acetaminophen (TYLENOL) 325 MG tablet Take 2 tablets (650 mg total) by mouth every 6 (six) hours as needed for mild pain or moderate pain.   apixaban (ELIQUIS) 2.5 MG TABS tablet Take 1 tablet (2.5 mg total) by mouth 2 (two) times daily.   atorvastatin (LIPITOR) 20 MG tablet Take 1 tablet (20 mg total) by mouth at bedtime.   Calcium Carb-Cholecalciferol 600-10 MG-MCG TABS Take 1 tablet by mouth 2 (two) times daily.   carvedilol (COREG) 25 MG tablet Take 12.5 mg by mouth 2 (two) times daily.   Cholecalciferol (VITAMIN D3) 50 MCG (2000 UT) capsule Take 2,000 Units by mouth in the morning and at bedtime.    DULoxetine (CYMBALTA) 30 MG capsule Take 30 mg by mouth daily.   ferrous sulfate 325 (65 FE) MG tablet Take 325 mg by mouth every Monday, Wednesday, and Friday.  fluticasone (FLONASE) 50 MCG/ACT nasal spray Place 1-2 sprays into both nostrils daily as needed for allergies or rhinitis.   gabapentin (NEURONTIN) 100 MG capsule Take 100 mg by mouth every morning.   gabapentin (NEURONTIN) 300 MG capsule Take 300 mg by mouth at bedtime.   ibuprofen (ADVIL) 200 MG tablet Take 600 mg by mouth every 6 (six) hours as needed for fever, headache or mild pain.   indapamide (LOZOL) 1.25 MG tablet Take 1.25 mg by mouth daily.   metoCLOPramide (REGLAN) 5 MG  tablet TAKE 1 TABLET (5 MG TOTAL) BY MOUTH 3 (THREE) TIMES DAILY BEFORE MEALS.   Multiple Vitamins-Minerals (MULTIVITAMIN WITH MINERALS) tablet Take 1 tablet by mouth daily.   pantoprazole (PROTONIX) 40 MG tablet Take 40 mg by mouth 2 (two) times daily before a meal.   No facility-administered encounter medications on file as of 01/14/2022.   Reviewed chart prior to disease state call. Spoke with patient regarding BP  Recent Office Vitals: BP Readings from Last 3 Encounters:  12/23/21 137/69  12/19/21 130/62  12/04/21 120/64   Pulse Readings from Last 3 Encounters:  12/23/21 69  12/19/21 71  12/04/21 74    Wt Readings from Last 3 Encounters:  12/23/21 216 lb (98 kg)  12/19/21 216 lb (98 kg)  12/04/21 214 lb (97.1 kg)     Kidney Function Lab Results  Component Value Date/Time   CREATININE 1.35 (H) 12/23/2021 11:21 AM   CREATININE 1.29 12/04/2021 04:08 PM   CREATININE 1.67 (H) 01/25/2021 11:25 AM   CREATININE 1.42 (H) 07/25/2020 11:54 AM   GFR 54.25 (L) 12/04/2021 04:08 PM   GFRNONAA 55 (L) 12/23/2021 11:21 AM   GFRNONAA 43 (L) 01/25/2021 11:25 AM   GFRAA 56 (L) 07/25/2020 11:54 AM    BMP Latest Ref Rng & Units 12/23/2021 12/04/2021 10/12/2021  Glucose 70 - 99 mg/dL 100(H) 87 121(H)  BUN 8 - 23 mg/dL 23 19 13   Creatinine 0.61 - 1.24 mg/dL 1.35(H) 1.29 1.25(H)  BUN/Creat Ratio 10 - 24 - - -  Sodium 135 - 145 mmol/L 141 141 138  Potassium 3.5 - 5.1 mmol/L 3.5 4.8 3.9  Chloride 98 - 111 mmol/L 104 107 107  CO2 22 - 32 mmol/L 28 30 23   Calcium 8.9 - 10.3 mg/dL 9.3 8.9 8.1(L)    Current antihypertensive regimen:  Carvedilol 12.5 mg two times daily  How often are you checking your Blood Pressure? 3-5x per week  Current home BP readings: 130/74  What recent interventions/DTPs have been made by any provider to improve Blood Pressure control since last CPP Visit: No recent interventions or DTPs.  Any recent hospitalizations or ED visits since last visit with CPP?  Yes  What diet changes have been made to improve Blood Pressure Control?  Patient states he eats a healthy diet.  What exercise is being done to improve your Blood Pressure Control?  Currently doing PT daily.  Adherence Review: Is the patient currently on ACE/ARB medication? No Does the patient have >5 day gap between last estimated fill dates? No   Care Gaps: Medicare Annual Wellness: Due now - scheduled 01/25/2021 Hemoglobin A1C: 5.5% on 06/05/2021 Colonoscopy: Completed 09/26/2021  Future Appointments  Date Time Provider [place]  01/17/2022  3:00 PM Sueanne Margarita, MD [facility]  01/25/2022 11:00 AM [facility]  04/03/2022 11:20 AM [name], MD [facility]    Star Rating Drugs: None  April D Calhoun, [place] Pharmacist Assistant [phone]

## 2022-01-16 DIAGNOSIS — M9701XD Periprosthetic fracture around internal prosthetic right hip joint, subsequent encounter: Secondary | ICD-10-CM | POA: Diagnosis not present

## 2022-01-16 DIAGNOSIS — J45909 Unspecified asthma, uncomplicated: Secondary | ICD-10-CM | POA: Diagnosis not present

## 2022-01-16 DIAGNOSIS — I130 Hypertensive heart and chronic kidney disease with heart failure and stage 1 through stage 4 chronic kidney disease, or unspecified chronic kidney disease: Secondary | ICD-10-CM | POA: Diagnosis not present

## 2022-01-16 DIAGNOSIS — I5032 Chronic diastolic (congestive) heart failure: Secondary | ICD-10-CM | POA: Diagnosis not present

## 2022-01-16 DIAGNOSIS — N1831 Chronic kidney disease, stage 3a: Secondary | ICD-10-CM | POA: Diagnosis not present

## 2022-01-16 DIAGNOSIS — S72301D Unspecified fracture of shaft of right femur, subsequent encounter for closed fracture with routine healing: Secondary | ICD-10-CM | POA: Diagnosis not present

## 2022-01-17 ENCOUNTER — Other Ambulatory Visit: Payer: Self-pay

## 2022-01-17 ENCOUNTER — Encounter: Payer: Self-pay | Admitting: Cardiology

## 2022-01-17 ENCOUNTER — Telehealth (INDEPENDENT_AMBULATORY_CARE_PROVIDER_SITE_OTHER): Payer: Medicare Other | Admitting: Cardiology

## 2022-01-17 VITALS — BP 124/70 | HR 90 | Ht 71.0 in | Wt 217.0 lb

## 2022-01-17 DIAGNOSIS — I5032 Chronic diastolic (congestive) heart failure: Secondary | ICD-10-CM | POA: Diagnosis not present

## 2022-01-17 DIAGNOSIS — I10 Essential (primary) hypertension: Secondary | ICD-10-CM

## 2022-01-17 DIAGNOSIS — E7800 Pure hypercholesterolemia, unspecified: Secondary | ICD-10-CM

## 2022-01-17 DIAGNOSIS — N183 Chronic kidney disease, stage 3 unspecified: Secondary | ICD-10-CM

## 2022-01-17 DIAGNOSIS — R931 Abnormal findings on diagnostic imaging of heart and coronary circulation: Secondary | ICD-10-CM | POA: Diagnosis not present

## 2022-01-17 DIAGNOSIS — I2782 Chronic pulmonary embolism: Secondary | ICD-10-CM | POA: Diagnosis not present

## 2022-01-17 NOTE — Patient Instructions (Signed)

## 2022-01-17 NOTE — Progress Notes (Signed)
Cardiology Office Note    Date:  01/17/2022   ID:  Bruce Ball, DOB Sep 21, 1946, MRN [id number]   PCP:  [name], MD   [name]  Cardiologist:  Fransico Him, MD  Advanced Practice Provider:  No care team member to display Electrophysiologist:  None   [id number]   Chief Complaint  Patient presents with   Congestive Heart Failure   Hypertension   Coronary Artery Disease   Hyperlipidemia     History of Present Illness:  Bruce Ball is a 76 y.o. male with history of hypertension, PE on chronic anticoagulation with Eliquis.  Patient was seen by Bruce Ball in the hospital 03/28/2021 after developing lower extremity edema after 2400 mile road trip at the [facility] [place] and eating fast foods.  CT showed no PE but did have coronary artery calcification, echo hyperdynamic LVEF greater than 75% with moderate LVH and grade 1 DD trivial MR mild dilatation of the ascending aorta.  He was dx with acute diastolic CHF.  He was treated with Lasix 40 mg twice daily and diuresed 3.2 L and 16 pounds but creatinine bumped up.  Blood pressure was elevated and he was continued on carvedilol 25 mg twice daily.   He had a coronary Ca score done which showed a coronary Ca score of 152 in all 3 epicardial vessels which was 43rd% for age and sex matched controls. He also had extensive aortic arch and descending aortic atherosclerosis and AV leaflet calcifications.   He is here today for followup and is doing well.  He denies any chest pain or pressure, SOB, DOE, PND, orthopnea,  dizziness, palpitations or syncope.  Since I saw him last he broke his right leg and had some problems with LE edema which has since resolved. He is compliant with his meds and is tolerating meds with no SE.     Past Medical History:  Diagnosis Date   Ankylosing spondylitis ([place])    Arthritis of right knee    Basal cell carcinoma    Chronic back pain greater than 3 months duration    Chronic leg pain     CKD (chronic kidney disease), stage II    Coronary artery calcification seen on CAT scan 03/27/2021   Deafness in right ear    Dyspnea    cause by PE   Essential tremor    Extrinsic asthma, unspecified 04/07/2009   PT DENIES   GERD (gastroesophageal reflux disease)    Hearing loss    Hip problem    History of kidney stones    HYPERTENSION 10/29/2007   HYPOTENSION 08/13/2010   LOW BACK PAIN 04/21/2008   Mild aortic stenosis    NEPHROLITHIASIS, HX OF 04/21/2008   Neuropathy    Osteopenia    Other hyperlipidemia    Peptic ulcer    Pneumonia    PUD, HX OF 04/21/2008   Pulmonary emboli ([place]) 2021   noted on CT 12-2019   Renal disorder    Stage 3 chronic kidney disease (HCC)    Thyroid nodule     Past Surgical History:  Procedure Laterality Date   BACK SURGERY     bone cancer  1970   rt femur removed and steel rod placed   [place], URETEROSCOPY AND STENT PLACEMENT Right 11/06/2017   Procedure: CYSTOSCOPY WITH  STENT PLACEMENT RIGHT;  Surgeon: Raynelle Bring, MD;  Location: [facility];  Service: Urology;  Laterality: Right;   CYSTOSCOPY WITH RETROGRADE  PYELOGRAM, URETEROSCOPY AND STENT PLACEMENT Left 06/09/2019   Procedure: CYSTOSCOPY WITH RETROGRADE PYELOGRAM, URETEROSCOPY AND STENT PLACEMENT X2;  Surgeon: [name], MD;  Location: [facility];  Service: Urology;  Laterality: Left;   CYSTOSCOPY WITH RETROGRADE PYELOGRAM, URETEROSCOPY AND STENT PLACEMENT Bilateral 03/01/2020   Procedure: CYSTOSCOPY WITH RETROGRADE PYELOGRAM, URETEROSCOPY AND STENT PLACEMENT;  Surgeon: [name], MD;  Location: [facility];  Service: Urology;  Laterality: Bilateral;  75 MINS   CYSTOSCOPY/RETROGRADE/URETEROSCOPY     1 year ago at [place] in [place] Right 11/24/2017   Procedure: CYSTOSCOPY/URETEROSCOPY/ RETROGRADE/STENT REMOVAL;  Surgeon: Raynelle Bring, MD;  Location: [facility];  Service: Urology;  Laterality: Right;   HOLMIUM LASER APPLICATION  Bilateral 0/08/8118   Procedure: HOLMIUM LASER APPLICATION;  Surgeon: [name], MD;  Location: [facility];  Service: Urology;  Laterality: Bilateral;   prosatectomy      Current Medications: Current Meds  Medication Sig   acetaminophen (TYLENOL) 325 MG tablet Take 2 tablets (650 mg total) by mouth every 6 (six) hours as needed for mild pain or moderate pain.   apixaban (ELIQUIS) 2.5 MG TABS tablet Take 1 tablet (2.5 mg total) by mouth 2 (two) times daily.   atorvastatin (LIPITOR) 20 MG tablet Take 1 tablet (20 mg total) by mouth at bedtime.   Calcium Carb-Cholecalciferol 600-10 MG-MCG TABS Take 1 tablet by mouth 2 (two) times daily.   carvedilol (COREG) 25 MG tablet Take 12.5 mg by mouth 2 (two) times daily.   Cholecalciferol (VITAMIN D3) 50 MCG (2000 UT) capsule Take 2,000 Units by mouth in the morning and at bedtime.    DULoxetine (CYMBALTA) 30 MG capsule Take 30 mg by mouth daily.   ferrous sulfate 325 (65 FE) MG tablet Take 325 mg by mouth every Monday, Wednesday, and Friday.   fluticasone (FLONASE) 50 MCG/ACT nasal spray Place 1-2 sprays into both nostrils daily as needed for allergies or rhinitis.   gabapentin (NEURONTIN) 100 MG capsule Take 100 mg by mouth every morning.   gabapentin (NEURONTIN) 300 MG capsule Take 300 mg by mouth at bedtime.   ibuprofen (ADVIL) 200 MG tablet Take 600 mg by mouth every 6 (six) hours as needed for fever, headache or mild pain.   indapamide (LOZOL) 1.25 MG tablet Take 1.25 mg by mouth daily.   methocarbamol (ROBAXIN) 500 MG tablet Take 1 tablet by mouth at bedtime as needed for muscle spasms.   metoCLOPramide (REGLAN) 5 MG tablet TAKE 1 TABLET (5 MG TOTAL) BY MOUTH 3 (THREE) TIMES DAILY BEFORE MEALS.   Multiple Vitamins-Minerals (MULTIVITAMIN WITH MINERALS) tablet Take 1 tablet by mouth daily.   pantoprazole (PROTONIX) 40 MG tablet Take 40 mg by mouth 2 (two) times daily before a meal.   traMADol (ULTRAM) 50 MG tablet Take 50 mg by mouth as needed.      Allergies:   Lisinopril   Social History   Socioeconomic History   Marital status: Married    Spouse name: Not on file   Number of children: 2   Years of education: Not on file   Highest education level: Not on file  Occupational History   Occupation: Retired Optometrist  Tobacco Use   Smoking status: Former    Packs/day: 1.00    Years: 17.00    Pack years: 17.00    Types: Cigarettes    Start date: 1963    Quit date: 12/30/1978    Years since quitting: 43.0   Smokeless tobacco: Never  Vaping Use  Vaping Use: Never used  Substance and Sexual Activity   Alcohol use: No   Drug use: No   Sexual activity: Not on file  Other Topics Concern   Not on file  Social History Narrative   ** Merged History Encounter **       Social Determinants of Health   Financial Resource Strain: Not on file  Food Insecurity: No Food Insecurity   Worried About Charity fundraiser in the Last Year: Never true   Arboriculturist in the Last Year: Never true  Transportation Needs: Not on file  Physical Activity: Not on file  Stress: Not on file  Social Connections: Not on file     Family History:  The patient's family history includes COPD in his father; Depression in his brother; Hypertension in his father; Prostate cancer in his brother; Stomach cancer in his brother; Stroke in his brother.   ROS:   Please see the history of present illness.    ROS All other systems reviewed and are negative.   PHYSICAL EXAM:   VS:  BP 124/70    Pulse 90    Ht 5\' 11"  (1.803 m)    Wt 217 lb (98.4 kg)    BMI 30.27 kg/m   Physical Exam   Well nourished, well developed male in no acute distress. Well appearing, alert and conversant, regular work of breathing,  good skin color  Eyes- anicteric mouth- oral mucosa is pink  neuro- grossly intact skin- no apparent rash or lesions or cyanosis  Wt Readings from Last 3 Encounters:  01/17/22 217 lb (98.4 kg)  12/23/21 216 lb (98 kg)  12/19/21 216 lb (98 kg)       Studies/Labs Reviewed:   EKG:  EKG is not ordered today.   Recent Labs: 10/11/2021: TSH 1.806 10/12/2021: Magnesium 2.0 12/04/2021: ALT 8 12/23/2021: B Natriuretic Peptide 30.1; BUN 23; Creatinine, Ser 1.35; Hemoglobin 12.2; Platelets 322; Potassium 3.5; Sodium 141   Lipid Panel    Component Value Date/Time   CHOL 101 07/11/2021 0000   CHOL 103 06/05/2021 0935   TRIG 84 07/11/2021 0000   HDL 34 (A) 07/11/2021 0000   HDL 38 (L) 06/05/2021 0935   CHOLHDL 3 04/03/2021 1637   VLDL 19.2 04/03/2021 1637   LDLCALC 50 07/11/2021 0000   LDLCALC 52 06/05/2021 0935   LDLDIRECT 145.4 06/27/2011 1124    Additional studies/ records that were reviewed today include:  ECHO 03/27/21 IMPRESSIONS     1. Intracavitary gradient. Peak velocity 2.07 m/s. Peak gradient 17 mmHg.  Left ventricular ejection fraction, by estimation, is >75%. Left  ventricular ejection fraction by 2D MOD biplane is 76.9 %. Left  ventricular ejection fraction by PLAX is 76 %. The   left ventricle has hyperdynamic function. The left ventricle has no  regional wall motion abnormalities. There is moderate concentric left  ventricular hypertrophy. Left ventricular diastolic parameters are  consistent with Grade I diastolic dysfunction  (impaired relaxation).   2. Right ventricular systolic function is normal. The right ventricular  size is normal. There is normal pulmonary artery systolic pressure.   3. The mitral valve is normal in structure. Trivial mitral valve  regurgitation. No evidence of mitral stenosis.   4. The aortic valve is calcified. There is mild calcification of the  aortic valve. There is mild thickening of the aortic valve. Aortic valve  regurgitation is not visualized. Mild aortic valve sclerosis is present,  with no evidence of aortic valve  stenosis.   5. Aortic dilatation noted. There is mild dilatation of the ascending  aorta, measuring 39 mm.   6. The inferior vena cava is normal in size  with greater than 50%  respiratory variability, suggesting right atrial pressure of 3 mmHg.   FINDINGS   Left Ventricle: Intracavitary gradient. Peak velocity 2.07 m/s. Peak  gradient 17 mmHg. Left ventricular ejection fraction, by estimation, is  >75%. Left ventricular ejection fraction by PLAX is 76 %. Left ventricular  ejection fraction by 2D MOD biplane  is 76.9 %. The left ventricle has hyperdynamic function. The left  ventricle has no regional wall motion abnormalities. The left ventricular  internal cavity size was normal in size. There is moderate concentric left  ventricular hypertrophy. Left  ventricular diastolic parameters are consistent with Grade I diastolic  dysfunction (impaired relaxation). Normal left ventricular filling  pressure.   Right Ventricle: The right ventricular size is normal. No increase in  right ventricular wall thickness. Right ventricular systolic function is  normal. There is normal pulmonary artery systolic pressure. The tricuspid  regurgitant velocity is 2.10 m/s, and   with an assumed right atrial pressure of 3 mmHg, the estimated right  ventricular systolic pressure is 60.1 mmHg.   Left Atrium: Left atrial size was normal in size.   Right Atrium: Right atrial size was normal in size.   Pericardium: Trivial pericardial effusion is present.   Mitral Valve: The mitral valve is normal in structure. Trivial mitral  valve regurgitation. No evidence of mitral valve stenosis.   Tricuspid Valve: The tricuspid valve is normal in structure. Tricuspid  valve regurgitation is trivial. No evidence of tricuspid stenosis.   Aortic Valve: The aortic valve is calcified. There is mild calcification  of the aortic valve. There is mild thickening of the aortic valve. Aortic  valve regurgitation is not visualized. Mild aortic valve sclerosis is  present, with no evidence of aortic  valve stenosis.   Pulmonic Valve: The pulmonic valve was normal in structure.  Pulmonic valve  regurgitation is not visualized. No evidence of pulmonic stenosis.   Aorta: Aortic dilatation noted. There is mild dilatation of the ascending  aorta, measuring 39 mm.   Venous: The inferior vena cava is normal in size with greater than 50%  respiratory variability, suggesting right atrial pressure of 3 mmHg.   IAS/Shunts: No atrial level shunt detected by color flow Doppler.      Echo 01/14/20 IMPRESSIONS     1. Left ventricular ejection fraction, by visual estimation, is 60 to  65%. The left ventricle has normal function. There is moderately increased  left ventricular hypertrophy.   2. Moderate asymmetric hypertrophy of the basal septum measuring 32mm  (posterior wall 43mm)   3. The mitral valve is normal in structure. No evidence of mitral valve  regurgitation.   4. The aortic valve is tricuspid. Aortic valve regurgitation is trivial.  Mild aortic valve sclerosis without stenosis.   5. The tricuspid valve is grossly normal.   6. The pulmonic valve was not well visualized. Pulmonic valve  regurgitation is trivial.   7. Global right ventricle has normal systolic function.The right  ventricular size is normal.   8. Left atrial size was normal.   9. Right atrial size was normal.  10. TR signal is inadequate for assessing pulmonary artery systolic  pressure.     ASSESSMENT:    1. Chronic diastolic congestive heart failure ([place])   2. Primary hypertension   3. Agatston coronary  artery calcium score between 100 and 199   4. Chronic pulmonary embolism, unspecified pulmonary embolism type, unspecified whether acute cor pulmonale present ([place])   5. Pure hypercholesterolemia   6. Stage 3 chronic kidney disease, unspecified whether stage 3a or 3b CKD (HCC)      PLAN:  In order of problems listed above:  Chronic diastolic CHF -Echo 05/1223 showed hyperdynamic LVEF greater than 75% with moderate LVH and grade 1 DD.   -His weight is stable and he has not had any  recent lower extremity edema or shortness of breath except when he fell and broke his leg and had rib fx.  He has some edema after that but completely resolved.  He was in ER in December with SOB related to rib fx. -he is only using Lasix PRN>>he has not taken any recently -continue Low Na diet  Hypertension  -BP has been controlled at home -Continue prescription drug management with indapamide 1.25 mg daily and carvedilol 12.5 mg twice daily with as needed refills - have personally reviewed and interpreted outside labs performed by patient's PCP which showed serum creatinine 1.35 and potassium 3.5 on 12/23/2021  Coronary calcifications on CT -coronary Ca score elevated at 152 -He has not had any anginal symptoms -Continue prescription drug management with atorvastatin 20 mg daily and carvedilol 25 mg twice daily  -No ASA as he is on anticoagulation with Eliquis for his history of PE  History of pulmonary embolus  -on chronic anticoagulation with Eliquis  HLD -LDL goal < 70 -I have personally reviewed and interpreted outside labs performed by patient's PCP which showed LDL 50, HDL 34, ALT 25 in July 2022 -he will continue with prescription drug management with atorvastatin 20 mg daily with as needed refills  CKD stage 3  -followed by Nephrology   Shared Decision Making/Informed Consent        Medication Adjustments/Labs and Tests Ordered: Current medicines are reviewed at length with the patient today.  Concerns regarding medicines are outlined above.  Medication changes, Labs and Tests ordered today are listed in the Patient Instructions below. There are no Patient Instructions on file for this visit.    Signed, Fransico Him, MD  01/17/2022 2:55 PM    [address], [place], [place]  [id number] Phone: [phone]; Fax: [phone]

## 2022-01-22 DIAGNOSIS — Z96649 Presence of unspecified artificial hip joint: Secondary | ICD-10-CM | POA: Diagnosis not present

## 2022-01-22 DIAGNOSIS — M7061 Trochanteric bursitis, right hip: Secondary | ICD-10-CM | POA: Diagnosis not present

## 2022-01-22 DIAGNOSIS — M978XXA Periprosthetic fracture around other internal prosthetic joint, initial encounter: Secondary | ICD-10-CM | POA: Diagnosis not present

## 2022-01-24 DIAGNOSIS — I130 Hypertensive heart and chronic kidney disease with heart failure and stage 1 through stage 4 chronic kidney disease, or unspecified chronic kidney disease: Secondary | ICD-10-CM | POA: Diagnosis not present

## 2022-01-24 DIAGNOSIS — I5032 Chronic diastolic (congestive) heart failure: Secondary | ICD-10-CM | POA: Diagnosis not present

## 2022-01-24 DIAGNOSIS — J45909 Unspecified asthma, uncomplicated: Secondary | ICD-10-CM | POA: Diagnosis not present

## 2022-01-24 DIAGNOSIS — S72301D Unspecified fracture of shaft of right femur, subsequent encounter for closed fracture with routine healing: Secondary | ICD-10-CM | POA: Diagnosis not present

## 2022-01-24 DIAGNOSIS — N1831 Chronic kidney disease, stage 3a: Secondary | ICD-10-CM | POA: Diagnosis not present

## 2022-01-24 DIAGNOSIS — M9701XD Periprosthetic fracture around internal prosthetic right hip joint, subsequent encounter: Secondary | ICD-10-CM | POA: Diagnosis not present

## 2022-01-25 ENCOUNTER — Other Ambulatory Visit: Payer: Self-pay

## 2022-01-25 ENCOUNTER — Ambulatory Visit (INDEPENDENT_AMBULATORY_CARE_PROVIDER_SITE_OTHER): Payer: Medicare Other

## 2022-01-25 DIAGNOSIS — M9701XD Periprosthetic fracture around internal prosthetic right hip joint, subsequent encounter: Secondary | ICD-10-CM | POA: Diagnosis not present

## 2022-01-25 DIAGNOSIS — Z0000 Encounter for general adult medical examination without abnormal findings: Secondary | ICD-10-CM

## 2022-01-25 DIAGNOSIS — I5032 Chronic diastolic (congestive) heart failure: Secondary | ICD-10-CM | POA: Diagnosis not present

## 2022-01-25 DIAGNOSIS — N1831 Chronic kidney disease, stage 3a: Secondary | ICD-10-CM | POA: Diagnosis not present

## 2022-01-25 DIAGNOSIS — J45909 Unspecified asthma, uncomplicated: Secondary | ICD-10-CM | POA: Diagnosis not present

## 2022-01-25 DIAGNOSIS — S72301D Unspecified fracture of shaft of right femur, subsequent encounter for closed fracture with routine healing: Secondary | ICD-10-CM | POA: Diagnosis not present

## 2022-01-25 DIAGNOSIS — I130 Hypertensive heart and chronic kidney disease with heart failure and stage 1 through stage 4 chronic kidney disease, or unspecified chronic kidney disease: Secondary | ICD-10-CM | POA: Diagnosis not present

## 2022-01-25 NOTE — Patient Instructions (Signed)
[name] , Thank you for taking time to come for your Medicare Wellness Visit. I appreciate your ongoing commitment to your health goals. Please review the following plan we discussed and let me know if I can assist you in the future.   Screening recommendations/referrals: [facility]: [facility] Recommended yearly ophthalmology/optometry visit for glaucoma screening and checkup Recommended yearly dental visit for hygiene and checkup  Vaccinations: Influenza vaccine: Done 09/28/21 Pneumococcal vaccine: Due  Tdap vaccine: Done 02/14/16 repeat every 10 years  Shingles vaccine: Completed 1st dose 11/28/20   Covid-19: Completed 1/1, 1/22, 2/12, 10/02/20 & 09/28/21  Advanced directives: pt stated copies submitted   Conditions/risks identified: Lose weight   Next appointment: Follow up in one year for your annual wellness visit.   Preventive Care 76 Years and Older, Male Preventive care refers to lifestyle choices and visits with your health care provider that can promote health and wellness. What does preventive care include? A yearly physical exam. This is also called an annual well check. Dental exams once or twice a year. Routine eye exams. Ask your health care provider how often you should have your eyes checked. Personal lifestyle choices, including: Daily care of your teeth and gums. Regular physical activity. Eating a healthy diet. Avoiding tobacco and drug use. Limiting alcohol use. Practicing safe sex. Taking low doses of aspirin every day. Taking vitamin and mineral supplements as recommended by your health care provider. What happens during an annual well check? The services and screenings done by your health care provider during your annual well check will depend on your age, overall health, lifestyle risk factors, and family history of disease. Counseling  Your health care provider may ask you questions about your: Alcohol use. Tobacco  use. Drug use. Emotional well-being. Home and relationship well-being. Sexual activity. Eating habits. History of falls. Memory and ability to understand (cognition). Work and work Statistician. Screening  You may have the following tests or measurements: Height, weight, and BMI. Blood pressure. Lipid and cholesterol levels. These may be checked every 5 years, or more frequently if you are over 5 years old. Skin check. Lung cancer screening. You may have this screening every year starting at age 19 if you have a 30-pack-year history of smoking and currently smoke or have quit within the past 15 years. Fecal occult blood test (FOBT) of the stool. You may have this test every year starting at age 36. Flexible sigmoidoscopy or colonoscopy. You may have a sigmoidoscopy every 5 years or a colonoscopy every 10 years starting at age 76. Prostate cancer screening. Recommendations will vary depending on your family history and other risks. Hepatitis C blood test. Hepatitis B blood test. Sexually transmitted disease (STD) testing. Diabetes screening. This is done by checking your blood sugar (glucose) after you have not eaten for a while (fasting). You may have this done every 1-3 years. Abdominal aortic aneurysm (AAA) screening. You may need this if you are a current or former smoker. Osteoporosis. You may be screened starting at age 76 if you are at high risk. Talk with your health care provider about your test results, treatment options, and if necessary, the need for more tests. Vaccines  Your health care provider may recommend certain vaccines, such as: Influenza vaccine. This is recommended every year. Tetanus, diphtheria, and acellular pertussis (Tdap, Td) vaccine. You may need a Td booster every 10 years. Zoster vaccine. You may need this after age 63. Pneumococcal 13-valent conjugate (PCV13) vaccine. One dose is  recommended after age 76. Pneumococcal polysaccharide (PPSV23) vaccine.  One dose is recommended after age 76. Talk to your health care provider about which screenings and vaccines you need and how often you need them. This information is not intended to replace advice given to you by your health care provider. Make sure you discuss any questions you have with your health care provider. Document Released: 01/12/2016 Document Revised: 09/04/2016 Document Reviewed: 10/17/2015 Elsevier Interactive Patient Education  2017 [employer] Prevention in the Home Falls can cause injuries. They can happen to people of all ages. There are many things you can do to make your home safe and to help prevent falls. What can I do on the outside of my home? Regularly fix the edges of walkways and driveways and fix any cracks. Remove anything that might make you trip as you walk through a door, such as a raised step or threshold. Trim any bushes or trees on the path to your home. Use bright outdoor lighting. Clear any walking paths of anything that might make someone trip, such as rocks or tools. Regularly check to see if handrails are loose or broken. Make sure that both sides of any steps have handrails. Any raised decks and porches should have guardrails on the edges. Have any leaves, snow, or ice cleared regularly. Use sand or salt on walking paths during winter. Clean up any spills in your garage right away. This includes oil or grease spills. What can I do in the bathroom? Use night lights. Install grab bars by the toilet and in the tub and shower. Do not use towel bars as grab bars. Use non-skid mats or decals in the tub or shower. If you need to sit down in the shower, use a plastic, non-slip stool. Keep the floor dry. Clean up any water that spills on the floor as soon as it happens. Remove soap buildup in the tub or shower regularly. Attach bath mats securely with double-sided non-slip rug tape. Do not have throw rugs and other things on the floor that can make  you trip. What can I do in the bedroom? Use night lights. Make sure that you have a light by your bed that is easy to reach. Do not use any sheets or blankets that are too big for your bed. They should not hang down onto the floor. Have a firm chair that has side arms. You can use this for support while you get dressed. Do not have throw rugs and other things on the floor that can make you trip. What can I do in the kitchen? Clean up any spills right away. Avoid walking on wet floors. Keep items that you use a lot in easy-to-reach places. If you need to reach something above you, use a strong step stool that has a grab bar. Keep electrical cords out of the way. Do not use floor polish or wax that makes floors slippery. If you must use wax, use non-skid floor wax. Do not have throw rugs and other things on the floor that can make you trip. What can I do with my stairs? Do not leave any items on the stairs. Make sure that there are handrails on both sides of the stairs and use them. Fix handrails that are broken or loose. Make sure that handrails are as long as the stairways. Check any carpeting to make sure that it is firmly attached to the stairs. Fix any carpet that is loose or worn. Avoid having  throw rugs at the top or bottom of the stairs. If you do have throw rugs, attach them to the floor with carpet tape. Make sure that you have a light switch at the top of the stairs and the bottom of the stairs. If you do not have them, ask someone to add them for you. What else can I do to help prevent falls? Wear shoes that: Do not have high heels. Have rubber bottoms. Are comfortable and fit you well. Are closed at the toe. Do not wear sandals. If you use a stepladder: Make sure that it is fully opened. Do not climb a closed stepladder. Make sure that both sides of the stepladder are locked into place. Ask someone to hold it for you, if possible. Clearly mark and make sure that you can  see: Any grab bars or handrails. First and last steps. Where the edge of each step is. Use tools that help you move around (mobility aids) if they are needed. These include: Canes. Walkers. Scooters. Crutches. Turn on the lights when you go into a dark area. Replace any light bulbs as soon as they burn out. Set up your furniture so you have a clear path. Avoid moving your furniture around. If any of your floors are uneven, fix them. If there are any pets around you, be aware of where they are. Review your medicines with your doctor. Some medicines can make you feel dizzy. This can increase your chance of falling. Ask your doctor what other things that you can do to help prevent falls. This information is not intended to replace advice given to you by your health care provider. Make sure you discuss any questions you have with your health care provider. Document Released: 10/12/2009 Document Revised: 05/23/2016 Document Reviewed: 01/20/2015 Elsevier Interactive Patient Education  2017 [employer].

## 2022-01-25 NOTE — Progress Notes (Addendum)
Virtual Visit via Telephone Note  I connected with  Bruce Ball on 01/25/22 at 11:00 AM EST by telephone and verified that I am speaking with the correct person using two identifiers.  Medicare Annual Wellness visit completed telephonically due to Covid-19 pandemic.   Persons participating in this call: This Health Coach and this patient.   Location: Patient: home Provider: office   I discussed the limitations, risks, security and privacy concerns of performing an evaluation and management service by telephone and the availability of in person appointments. The patient expressed understanding and agreed to proceed.  Unable to perform video visit due to video visit attempted and failed and/or patient does not have video capability.   Some vital signs may be absent or patient reported.   Willette Brace, LPN   Subjective:   Bruce Ball is a 76 y.o. male who presents for Medicare Annual/Subsequent preventive examination.  Review of Systems     Cardiac Risk Factors include: advanced age (>71men, >29 women);male gender;dyslipidemia;hypertension;obesity (BMI >30kg/m2)     Objective:    There were no vitals filed for this visit. There is no height or weight on file to calculate BMI.  Advanced Directives 01/25/2022 12/23/2021 10/11/2021 08/23/2021 08/02/2021 03/26/2021 03/26/2021  Does Patient Have a Medical Advance Directive? Yes Yes Yes No No Yes No  Type of Paramedic of [place];Living will [place];Living will Living will - - [place];Living will -  Does patient want to make changes to medical advance directive? - - No - Patient declined - - No - Patient declined -  Copy of [place] in Chart? (No Data) - - - - No - copy requested -  Would patient like information on creating a medical advance directive? - - - No - Patient declined No - Patient declined No - Patient declined No - Patient declined     Current Medications (verified) Outpatient Encounter Medications as of 01/25/2022  Medication Sig   acetaminophen (TYLENOL) 325 MG tablet Take 2 tablets (650 mg total) by mouth every 6 (six) hours as needed for mild pain or moderate pain.   apixaban (ELIQUIS) 2.5 MG TABS tablet Take 1 tablet (2.5 mg total) by mouth 2 (two) times daily.   atorvastatin (LIPITOR) 20 MG tablet Take 1 tablet (20 mg total) by mouth at bedtime.   Calcium Carb-Cholecalciferol 600-10 MG-MCG TABS Take 1 tablet by mouth 2 (two) times daily.   carvedilol (COREG) 25 MG tablet Take 12.5 mg by mouth 2 (two) times daily.   Cholecalciferol (VITAMIN D3) 50 MCG (2000 UT) capsule Take 2,000 Units by mouth in the morning and at bedtime.    DULoxetine (CYMBALTA) 30 MG capsule Take 30 mg by mouth daily.   ferrous sulfate 325 (65 FE) MG tablet Take 325 mg by mouth every Monday, Wednesday, and Friday.   fluticasone (FLONASE) 50 MCG/ACT nasal spray Place 1-2 sprays into both nostrils daily as needed for allergies or rhinitis.   gabapentin (NEURONTIN) 100 MG capsule Take 100 mg by mouth every morning.   gabapentin (NEURONTIN) 300 MG capsule Take 300 mg by mouth at bedtime.   ibuprofen (ADVIL) 200 MG tablet Take 600 mg by mouth every 6 (six) hours as needed for fever, headache or mild pain.   indapamide (LOZOL) 1.25 MG tablet Take 1.25 mg by mouth daily.   methocarbamol (ROBAXIN) 500 MG tablet Take 1 tablet by mouth at bedtime as needed for muscle spasms.  metoCLOPramide (REGLAN) 5 MG tablet TAKE 1 TABLET (5 MG TOTAL) BY MOUTH 3 (THREE) TIMES DAILY BEFORE MEALS.   Multiple Vitamins-Minerals (MULTIVITAMIN WITH MINERALS) tablet Take 1 tablet by mouth daily.   pantoprazole (PROTONIX) 40 MG tablet Take 40 mg by mouth 2 (two) times daily before a meal.   traMADol (ULTRAM) 50 MG tablet Take 50 mg by mouth as needed.   No facility-administered encounter medications on file as of 01/25/2022.    Allergies (verified) Erythromycin and  Lisinopril   History: Past Medical History:  Diagnosis Date   Ankylosing spondylitis (HCC)    Arthritis of right knee    Basal cell carcinoma    Chronic back pain greater than 3 months duration    Chronic leg pain    CKD (chronic kidney disease), stage II    Coronary artery calcification seen on CAT scan 03/27/2021   Deafness in right ear    Dyspnea    cause by PE   Essential tremor    Extrinsic asthma, unspecified 04/07/2009   PT DENIES   GERD (gastroesophageal reflux disease)    Hearing loss    Hip problem    History of kidney stones    HYPERTENSION 10/29/2007   HYPOTENSION 08/13/2010   LOW BACK PAIN 04/21/2008   Mild aortic stenosis    NEPHROLITHIASIS, HX OF 04/21/2008   Neuropathy    Osteopenia    Other hyperlipidemia    Peptic ulcer    Pneumonia    PUD, HX OF 04/21/2008   Pulmonary emboli ([place]) 2021   noted on CT 12-2019   Renal disorder    Stage 3 chronic kidney disease (HCC)    Thyroid nodule    Past Surgical History:  Procedure Laterality Date   BACK SURGERY     bone cancer  1970   rt femur removed and steel rod placed   [place], URETEROSCOPY AND STENT PLACEMENT Right 11/06/2017   Procedure: CYSTOSCOPY WITH  STENT PLACEMENT RIGHT;  Surgeon: Raynelle Bring, MD;  Location: [facility];  Service: Urology;  Laterality: Right;   CYSTOSCOPY WITH RETROGRADE PYELOGRAM, URETEROSCOPY AND STENT PLACEMENT Left 06/09/2019   Procedure: CYSTOSCOPY WITH RETROGRADE PYELOGRAM, URETEROSCOPY AND STENT PLACEMENT X2;  Surgeon: [name], MD;  Location: [facility];  Service: Urology;  Laterality: Left;   CYSTOSCOPY WITH RETROGRADE PYELOGRAM, URETEROSCOPY AND STENT PLACEMENT Bilateral 03/01/2020   Procedure: CYSTOSCOPY WITH RETROGRADE PYELOGRAM, URETEROSCOPY AND STENT PLACEMENT;  Surgeon: [name], MD;  Location: [facility];  Service: Urology;  Laterality: Bilateral;  75 MINS   CYSTOSCOPY/RETROGRADE/URETEROSCOPY     1 year ago at [place] in [place] Right 11/24/2017   Procedure: CYSTOSCOPY/URETEROSCOPY/ RETROGRADE/STENT REMOVAL;  Surgeon: Raynelle Bring, MD;  Location: [facility];  Service: Urology;  Laterality: Right;   HOLMIUM LASER APPLICATION Bilateral 08/01/1516   Procedure: HOLMIUM LASER APPLICATION;  Surgeon: [name], MD;  Location: [facility];  Service: Urology;  Laterality: Bilateral;   prosatectomy     Family History  Problem Relation Age of Onset   COPD Father    Hypertension Father    Prostate cancer Brother    Stomach cancer Brother        on chemo in 2022- in 76s developed   Depression Brother        suicide after brain stem injury after accident   Stroke Brother        7 years old   Esophageal cancer Neg Hx    Pancreatic cancer Neg  Hx    Colon cancer Neg Hx    Social History   Socioeconomic History   Marital status: Married    Spouse name: Not on file   Number of children: 2   Years of education: Not on file   Highest education level: Not on file  Occupational History   Occupation: Retired Optometrist  Tobacco Use   Smoking status: Former    Packs/day: 1.00    Years: 17.00    Pack years: 17.00    Types: Cigarettes    Start date: 1963    Quit date: 12/30/1978    Years since quitting: 43.1   Smokeless tobacco: Never  Vaping Use   Vaping Use: Never used  Substance and Sexual Activity   Alcohol use: No   Drug use: No   Sexual activity: Not on file  Other Topics Concern   Not on file  Social History Narrative   ** Merged History Encounter **       Social Determinants of Health   Financial Resource Strain: Low Risk    Difficulty of Paying Living Expenses: Not hard at all  Food Insecurity: No Food Insecurity   Worried About Charity fundraiser in the Last Year: Never true   [place] in the Last Year: Never true  Transportation Needs: No Transportation Needs   Lack of Transportation (Medical): No   Lack of Transportation (Non-Medical): No  Physical  Activity: Insufficiently Active   Days of Exercise per Week: 2 days   Minutes of Exercise per Session: 50 min  Stress: No Stress Concern Present   Feeling of Stress : Not at all  Social Connections: Moderately Isolated   Frequency of Communication with Friends and Family: More than three times a week   Frequency of Social Gatherings with Friends and Family: More than three times a week   Attends Religious Services: Never   Marine scientist or Organizations: No   Attends Music therapist: Never   Marital Status: Married    Tobacco Counseling Counseling given: Not Answered   Clinical Intake:  Pre-visit preparation completed: Yes  Pain : No/denies pain     BMI - recorded: 30.28 Nutritional Risks: None Diabetes: No  How often do you need to have someone help you when you read instructions, pamphlets, or other written materials from your doctor or pharmacy?: 1 - Never  Diabetic?No  Interpreter Needed?: No  Information entered by :: Charlott Rakes, LPN   Activities of Daily Living In your present state of health, do you have any difficulty performing the following activities: 01/25/2022 10/11/2021  Hearing? Y N  Comment wears hearing aids -  Vision? N N  Difficulty concentrating or making decisions? N N  Comment - -  Walking or climbing stairs? Y Y  Comment unable at this time -  Dressing or bathing? N N  Doing errands, shopping? N Y  Conservation officer, nature and eating ? N -  Using the Toilet? N -  Managing your Medications? N -  Managing your Finances? N -  Housekeeping or managing your Housekeeping? N -  Some recent data might be hidden    Patient Care Team: [name], MD as PCP - General (Family Medicine) Sueanne Margarita, MD as PCP - Cardiology (Cardiology) [facility], [facility] as Consulting Physician Jovita Gamma, MD as Consulting Physician (Neurosurgery) Clinic, [facility] as Consulting Physician [name], MD  as Consulting Physician (Urology) Madelin Rear, [facility] as Pharmacist (  Pharmacist) Lyndee Hensen, PT as Physical Therapist (Physical Therapy) [name], MD (Family Medicine)  Indicate any recent Medical Services you may have received from other than [facility] providers in the past year (date may be approximate).     Assessment:   This is a routine wellness examination for T[place]ashi.  Hearing/Vision screen Hearing Screening - Comments:: Pt wears hearing aids  [facility] - [facility]:: [facility]   Dietary issues and exercise activities discussed: Current Exercise Habits: Structured exercise class, Type of exercise: Other - see comments (PT), Time (Minutes): 45, Frequency (Times/Week): 2, Weekly Exercise (Minutes/Week): 90   Goals Addressed             This Visit's Progress    Patient Stated       Lose weight        Depression Screen PHQ 2/9 Scores 01/25/2022 08/28/2021 06/05/2021 04/03/2021 12/22/2019 11/29/2019 08/28/2017  PHQ - 2 Score 0 0 4 0 0 1 0  PHQ- 9 Score - - 15 - - - -    Fall Risk Fall Risk  01/25/2022 08/28/2021 05/04/2020 04/04/2020 02/07/2020  Falls in the past year? 1 1 1 1  0  Comment - - - - -  Number falls in past yr: 1 1 1 1  -  Comment - - - - -  Injury with Fall? 1 1 - 1 -  Comment right femur - - - -  Risk for fall due to : Impaired balance/gait;Impaired mobility;History of fall(s) History of fall(s) - - -  Follow up Falls prevention discussed Falls evaluation completed - - -    FALL RISK PREVENTION PERTAINING TO THE HOME:  Any stairs in or around the home? Yes  If so, are there any without handrails? No  Home free of loose throw rugs in walkways, pet beds, electrical cords, etc? Yes  Adequate lighting in your home to reduce risk of falls? Yes   ASSISTIVE DEVICES UTILIZED TO PREVENT FALLS:  Life alert? No  Use of a cane, walker or w/c? Yes  Grab bars in the bathroom? Yes  Shower chair or bench in shower? Yes  Elevated toilet seat or a  handicapped toilet? Yes   TIMED UP AND GO:  Was the test performed? No .  Cognitive Function: Declined      6CIT Screen 11/29/2019  What Year? 0 points  What month? 0 points  What time? 0 points  Count back from 20 0 points  Months in reverse 0 points  Repeat phrase 4 points  Total Score 4    Immunizations Immunization History  Administered Date(s) Administered   Influenza Split 12/30/2013, 11/14/2015, 12/13/2015, 09/16/2018   Influenza, High Dose Seasonal PF 09/15/2012, 11/06/2016, 11/26/2017, 09/16/2018, 10/01/2019, 10/14/2019, 10/02/2020, 09/28/2021   Influenza-Unspecified 12/13/2015   PFIZER Comirnaty(Gray Top)Covid-19 Tri-Sucrose Vaccine 01/21/2020, 02/11/2020   PFIZER(Purple Top)SARS-COV-2 Vaccination 12/31/2019, 01/21/2020, 02/11/2020, 10/02/2020   Pfizer Covid-19 Vaccine Bivalent Booster 43yrs & up 09/28/2021   Pneumococcal Conjugate-13 09/15/2012, 05/19/2014   Pneumococcal-Unspecified 09/15/2012, 05/19/2014   Td 03/23/2015   Tdap 08/30/2005, 03/23/2015, 02/14/2016   Zoster Recombinat (Shingrix) 11/28/2020   Zoster, Live 11/19/2013    TDAP status: Up to date  Flu Vaccine status: Up to date  Pneumococcal vaccine status: Due, Education has been provided regarding the importance of this vaccine. Advised may receive this vaccine at local pharmacy or Health Dept. Aware to provide a copy of the vaccination record if obtained from local pharmacy or Health Dept. Verbalized acceptance and understanding.  Covid-19 vaccine status: Completed vaccines  Qualifies for Shingles Vaccine? Yes   Zostavax completed Yes   Shingrix Completed?: Yes  Screening Tests Health Maintenance  Topic Date Due   Fecal DNA (Cologuard)  Never done   Pneumonia Vaccine 101+ Years old (2 - PPSV23 if available, else PCV20) 05/20/2015   Zoster Vaccines- Shingrix (2 of 2) 01/23/2021   TETANUS/TDAP  02/13/2026   INFLUENZA VACCINE  Completed   COVID-19 Vaccine  Completed   Hepatitis C Screening   Completed   HPV VACCINES  Aged Out   COLONOSCOPY (Pts 45-31yrs Insurance coverage will need to be confirmed)  Discontinued    Health Maintenance  Health Maintenance Due  Topic Date Due   Fecal DNA (Cologuard)  Never done   Pneumonia Vaccine 39+ Years old (2 - PPSV23 if available, else PCV20) 05/20/2015   Zoster Vaccines- Shingrix (2 of 2) 01/23/2021    Pt stated he gets cologuard from [place] last completed 08/2021   Additional Screening:  Hepatitis C Screening:  Completed 07/24/16  Vision Screening: Recommended annual ophthalmology exams for early detection of glaucoma and other disorders of the eye. Is the patient up to date with their annual eye exam?  Yes  Who is the provider or what is the name of the office in which the patient att[facility] If pt is not established with a provider, would they like to be referred to a provider to establish care? No .   Dental Screening: Recommended annual dental exams for proper oral hygiene  Community Resource Referral / Chronic Care Management: CRR required this visit?  No   CCM required this visit?  No      Plan:     I have personally reviewed and noted the following in the patients chart:   Medical and social history Use of alcohol, tobacco or illicit drugs  Current medications and supplements including opioid prescriptions. Patient is currently taking opioid prescriptions. Information provided to patient regarding non-opioid alternatives. Patient advised to discuss non-opioid treatment plan with their provider. Functional ability and status Nutritional status Physical activity Advanced directives List of other physicians Hospitalizations, surgeries, and ER visits in previous 12 months Vitals Screenings to include cognitive, depression, and falls Referrals and appointments  In addition, I have reviewed and discussed with patient certain preventive protocols, quality metrics, and best practice recommendations. A  written personalized care plan for preventive services as well as general preventive health recommendations were provided to patient.     Willette Brace, LPN   [id number]   Nurse Notes: none

## 2022-01-29 DIAGNOSIS — S72301D Unspecified fracture of shaft of right femur, subsequent encounter for closed fracture with routine healing: Secondary | ICD-10-CM | POA: Diagnosis not present

## 2022-01-29 DIAGNOSIS — I5032 Chronic diastolic (congestive) heart failure: Secondary | ICD-10-CM | POA: Diagnosis not present

## 2022-01-29 DIAGNOSIS — M9701XD Periprosthetic fracture around internal prosthetic right hip joint, subsequent encounter: Secondary | ICD-10-CM | POA: Diagnosis not present

## 2022-01-29 DIAGNOSIS — N1831 Chronic kidney disease, stage 3a: Secondary | ICD-10-CM | POA: Diagnosis not present

## 2022-01-29 DIAGNOSIS — J45909 Unspecified asthma, uncomplicated: Secondary | ICD-10-CM | POA: Diagnosis not present

## 2022-01-29 DIAGNOSIS — I130 Hypertensive heart and chronic kidney disease with heart failure and stage 1 through stage 4 chronic kidney disease, or unspecified chronic kidney disease: Secondary | ICD-10-CM | POA: Diagnosis not present

## 2022-01-30 ENCOUNTER — Other Ambulatory Visit: Payer: Self-pay | Admitting: Neurology

## 2022-01-31 DIAGNOSIS — I5032 Chronic diastolic (congestive) heart failure: Secondary | ICD-10-CM | POA: Diagnosis not present

## 2022-01-31 DIAGNOSIS — I130 Hypertensive heart and chronic kidney disease with heart failure and stage 1 through stage 4 chronic kidney disease, or unspecified chronic kidney disease: Secondary | ICD-10-CM | POA: Diagnosis not present

## 2022-01-31 DIAGNOSIS — Z87891 Personal history of nicotine dependence: Secondary | ICD-10-CM | POA: Diagnosis not present

## 2022-01-31 DIAGNOSIS — M5416 Radiculopathy, lumbar region: Secondary | ICD-10-CM | POA: Diagnosis not present

## 2022-01-31 DIAGNOSIS — Z8546 Personal history of malignant neoplasm of prostate: Secondary | ICD-10-CM | POA: Diagnosis not present

## 2022-01-31 DIAGNOSIS — Z7901 Long term (current) use of anticoagulants: Secondary | ICD-10-CM | POA: Diagnosis not present

## 2022-01-31 DIAGNOSIS — Z86711 Personal history of pulmonary embolism: Secondary | ICD-10-CM | POA: Diagnosis not present

## 2022-01-31 DIAGNOSIS — N1831 Chronic kidney disease, stage 3a: Secondary | ICD-10-CM | POA: Diagnosis not present

## 2022-01-31 DIAGNOSIS — I2510 Atherosclerotic heart disease of native coronary artery without angina pectoris: Secondary | ICD-10-CM | POA: Diagnosis not present

## 2022-01-31 DIAGNOSIS — E785 Hyperlipidemia, unspecified: Secondary | ICD-10-CM | POA: Diagnosis not present

## 2022-01-31 DIAGNOSIS — M9701XD Periprosthetic fracture around internal prosthetic right hip joint, subsequent encounter: Secondary | ICD-10-CM | POA: Diagnosis not present

## 2022-01-31 DIAGNOSIS — Z87442 Personal history of urinary calculi: Secondary | ICD-10-CM | POA: Diagnosis not present

## 2022-01-31 DIAGNOSIS — S72301D Unspecified fracture of shaft of right femur, subsequent encounter for closed fracture with routine healing: Secondary | ICD-10-CM | POA: Diagnosis not present

## 2022-01-31 DIAGNOSIS — E669 Obesity, unspecified: Secondary | ICD-10-CM | POA: Diagnosis not present

## 2022-01-31 DIAGNOSIS — J45909 Unspecified asthma, uncomplicated: Secondary | ICD-10-CM | POA: Diagnosis not present

## 2022-01-31 DIAGNOSIS — Z6829 Body mass index (BMI) 29.0-29.9, adult: Secondary | ICD-10-CM | POA: Diagnosis not present

## 2022-01-31 DIAGNOSIS — G9340 Encephalopathy, unspecified: Secondary | ICD-10-CM | POA: Diagnosis not present

## 2022-01-31 DIAGNOSIS — K219 Gastro-esophageal reflux disease without esophagitis: Secondary | ICD-10-CM | POA: Diagnosis not present

## 2022-02-04 DIAGNOSIS — S72301D Unspecified fracture of shaft of right femur, subsequent encounter for closed fracture with routine healing: Secondary | ICD-10-CM | POA: Diagnosis not present

## 2022-02-04 DIAGNOSIS — M9701XD Periprosthetic fracture around internal prosthetic right hip joint, subsequent encounter: Secondary | ICD-10-CM | POA: Diagnosis not present

## 2022-02-04 DIAGNOSIS — I130 Hypertensive heart and chronic kidney disease with heart failure and stage 1 through stage 4 chronic kidney disease, or unspecified chronic kidney disease: Secondary | ICD-10-CM | POA: Diagnosis not present

## 2022-02-04 DIAGNOSIS — I5032 Chronic diastolic (congestive) heart failure: Secondary | ICD-10-CM | POA: Diagnosis not present

## 2022-02-04 DIAGNOSIS — J45909 Unspecified asthma, uncomplicated: Secondary | ICD-10-CM | POA: Diagnosis not present

## 2022-02-04 DIAGNOSIS — N1831 Chronic kidney disease, stage 3a: Secondary | ICD-10-CM | POA: Diagnosis not present

## 2022-02-08 DIAGNOSIS — M9701XD Periprosthetic fracture around internal prosthetic right hip joint, subsequent encounter: Secondary | ICD-10-CM | POA: Diagnosis not present

## 2022-02-08 DIAGNOSIS — J45909 Unspecified asthma, uncomplicated: Secondary | ICD-10-CM | POA: Diagnosis not present

## 2022-02-08 DIAGNOSIS — I130 Hypertensive heart and chronic kidney disease with heart failure and stage 1 through stage 4 chronic kidney disease, or unspecified chronic kidney disease: Secondary | ICD-10-CM | POA: Diagnosis not present

## 2022-02-08 DIAGNOSIS — N1831 Chronic kidney disease, stage 3a: Secondary | ICD-10-CM | POA: Diagnosis not present

## 2022-02-08 DIAGNOSIS — S72301D Unspecified fracture of shaft of right femur, subsequent encounter for closed fracture with routine healing: Secondary | ICD-10-CM | POA: Diagnosis not present

## 2022-02-08 DIAGNOSIS — I5032 Chronic diastolic (congestive) heart failure: Secondary | ICD-10-CM | POA: Diagnosis not present

## 2022-02-11 DIAGNOSIS — I5032 Chronic diastolic (congestive) heart failure: Secondary | ICD-10-CM | POA: Diagnosis not present

## 2022-02-11 DIAGNOSIS — S72301D Unspecified fracture of shaft of right femur, subsequent encounter for closed fracture with routine healing: Secondary | ICD-10-CM | POA: Diagnosis not present

## 2022-02-11 DIAGNOSIS — N1831 Chronic kidney disease, stage 3a: Secondary | ICD-10-CM | POA: Diagnosis not present

## 2022-02-11 DIAGNOSIS — M9701XD Periprosthetic fracture around internal prosthetic right hip joint, subsequent encounter: Secondary | ICD-10-CM | POA: Diagnosis not present

## 2022-02-11 DIAGNOSIS — J45909 Unspecified asthma, uncomplicated: Secondary | ICD-10-CM | POA: Diagnosis not present

## 2022-02-11 DIAGNOSIS — I130 Hypertensive heart and chronic kidney disease with heart failure and stage 1 through stage 4 chronic kidney disease, or unspecified chronic kidney disease: Secondary | ICD-10-CM | POA: Diagnosis not present

## 2022-02-14 DIAGNOSIS — M9701XD Periprosthetic fracture around internal prosthetic right hip joint, subsequent encounter: Secondary | ICD-10-CM | POA: Diagnosis not present

## 2022-02-14 DIAGNOSIS — I5032 Chronic diastolic (congestive) heart failure: Secondary | ICD-10-CM | POA: Diagnosis not present

## 2022-02-14 DIAGNOSIS — S72301D Unspecified fracture of shaft of right femur, subsequent encounter for closed fracture with routine healing: Secondary | ICD-10-CM | POA: Diagnosis not present

## 2022-02-14 DIAGNOSIS — N1831 Chronic kidney disease, stage 3a: Secondary | ICD-10-CM | POA: Diagnosis not present

## 2022-02-14 DIAGNOSIS — I130 Hypertensive heart and chronic kidney disease with heart failure and stage 1 through stage 4 chronic kidney disease, or unspecified chronic kidney disease: Secondary | ICD-10-CM | POA: Diagnosis not present

## 2022-02-14 DIAGNOSIS — J45909 Unspecified asthma, uncomplicated: Secondary | ICD-10-CM | POA: Diagnosis not present

## 2022-02-18 DIAGNOSIS — S72301D Unspecified fracture of shaft of right femur, subsequent encounter for closed fracture with routine healing: Secondary | ICD-10-CM | POA: Diagnosis not present

## 2022-02-18 DIAGNOSIS — I130 Hypertensive heart and chronic kidney disease with heart failure and stage 1 through stage 4 chronic kidney disease, or unspecified chronic kidney disease: Secondary | ICD-10-CM | POA: Diagnosis not present

## 2022-02-18 DIAGNOSIS — J45909 Unspecified asthma, uncomplicated: Secondary | ICD-10-CM | POA: Diagnosis not present

## 2022-02-18 DIAGNOSIS — M9701XD Periprosthetic fracture around internal prosthetic right hip joint, subsequent encounter: Secondary | ICD-10-CM | POA: Diagnosis not present

## 2022-02-18 DIAGNOSIS — I5032 Chronic diastolic (congestive) heart failure: Secondary | ICD-10-CM | POA: Diagnosis not present

## 2022-02-18 DIAGNOSIS — N1831 Chronic kidney disease, stage 3a: Secondary | ICD-10-CM | POA: Diagnosis not present

## 2022-02-19 ENCOUNTER — Encounter: Payer: Self-pay | Admitting: Family Medicine

## 2022-02-19 ENCOUNTER — Telehealth: Payer: Self-pay | Admitting: Family Medicine

## 2022-02-19 DIAGNOSIS — M545 Low back pain, unspecified: Secondary | ICD-10-CM

## 2022-02-19 DIAGNOSIS — R2689 Other abnormalities of gait and mobility: Secondary | ICD-10-CM

## 2022-02-19 DIAGNOSIS — J329 Chronic sinusitis, unspecified: Secondary | ICD-10-CM

## 2022-02-19 DIAGNOSIS — G8929 Other chronic pain: Secondary | ICD-10-CM

## 2022-02-19 NOTE — Telephone Encounter (Signed)
..  Reason for Referral Request: Pt is needing more physical therapy for fracture pt sustained in 10/22  Has Patient been seen by PCP for this complaint? No  No, Please schedule patient for appointment for complaint.  Yes, Please find out following information:  Reason: Needing more physical therapy  Referral to which Specialty: Homehealth PT  Preferred office/ provider:  [facility]

## 2022-02-19 NOTE — Telephone Encounter (Signed)
Referral has been placed for [place].

## 2022-02-20 NOTE — Addendum Note (Signed)
Addended by: [name] on: 02/20/2022 01:58 PM   Modules accepted: Orders

## 2022-02-25 ENCOUNTER — Other Ambulatory Visit: Payer: Medicare Other

## 2022-02-25 DIAGNOSIS — J45909 Unspecified asthma, uncomplicated: Secondary | ICD-10-CM | POA: Diagnosis not present

## 2022-02-25 DIAGNOSIS — I130 Hypertensive heart and chronic kidney disease with heart failure and stage 1 through stage 4 chronic kidney disease, or unspecified chronic kidney disease: Secondary | ICD-10-CM | POA: Diagnosis not present

## 2022-02-25 DIAGNOSIS — M9701XD Periprosthetic fracture around internal prosthetic right hip joint, subsequent encounter: Secondary | ICD-10-CM | POA: Diagnosis not present

## 2022-02-25 DIAGNOSIS — N1831 Chronic kidney disease, stage 3a: Secondary | ICD-10-CM | POA: Diagnosis not present

## 2022-02-25 DIAGNOSIS — I5032 Chronic diastolic (congestive) heart failure: Secondary | ICD-10-CM | POA: Diagnosis not present

## 2022-02-25 DIAGNOSIS — S72301D Unspecified fracture of shaft of right femur, subsequent encounter for closed fracture with routine healing: Secondary | ICD-10-CM | POA: Diagnosis not present

## 2022-03-01 DIAGNOSIS — Z20822 Contact with and (suspected) exposure to covid-19: Secondary | ICD-10-CM | POA: Diagnosis not present

## 2022-03-03 DIAGNOSIS — Z20822 Contact with and (suspected) exposure to covid-19: Secondary | ICD-10-CM | POA: Diagnosis not present

## 2022-03-05 NOTE — Therapy (Signed)
OUTPATIENT PHYSICAL THERAPY THORACOLUMBAR EVALUATION   Patient Name: Bruce Ball MRN: [id number] DOB:[birth year], 76 y.o., male Today's Date: 03/06/2022   PT End of Session - 03/06/22 0932     Visit Number 1    Number of Visits 33    Date for PT Re-Evaluation 07/06/22    Authorization Type MCR    PT Start Time 0847    PT Stop Time 0932    PT Time Calculation (min) 45 min    Activity Tolerance Patient tolerated treatment well    Behavior During Therapy [facility] for tasks assessed/performed             Past Medical History:  Diagnosis Date   Ankylosing spondylitis ([place])    Arthritis of right knee    Basal cell carcinoma    Chronic back pain greater than 3 months duration    Chronic leg pain    CKD (chronic kidney disease), stage II    Coronary artery calcification seen on CAT scan 03/27/2021   Deafness in right ear    Dyspnea    cause by PE   Essential tremor    Extrinsic asthma, unspecified 04/07/2009   PT DENIES   GERD (gastroesophageal reflux disease)    Hearing loss    Hip problem    History of kidney stones    HYPERTENSION 10/29/2007   HYPOTENSION 08/13/2010   LOW BACK PAIN 04/21/2008   Mild aortic stenosis    NEPHROLITHIASIS, HX OF 04/21/2008   Neuropathy    Osteopenia    Other hyperlipidemia    Peptic ulcer    Pneumonia    PUD, HX OF 04/21/2008   Pulmonary emboli ([place]) 2021   noted on CT 12-2019   Renal disorder    Stage 3 chronic kidney disease (HCC)    Thyroid nodule    Past Surgical History:  Procedure Laterality Date   BACK SURGERY     bone cancer  1970   rt femur removed and steel rod placed   CYSTOSCOPY WITH RETROGRADE PYELOGRAM, URETEROSCOPY AND STENT PLACEMENT Right 11/06/2017   Procedure: CYSTOSCOPY WITH  STENT PLACEMENT RIGHT;  Surgeon: Raynelle Bring, MD;  Location: [facility];  Service: Urology;  Laterality: Right;   CYSTOSCOPY WITH RETROGRADE PYELOGRAM, URETEROSCOPY AND STENT PLACEMENT Left 06/09/2019   Procedure: CYSTOSCOPY WITH RETROGRADE  PYELOGRAM, URETEROSCOPY AND STENT PLACEMENT X2;  Surgeon: [name], MD;  Location: [facility];  Service: Urology;  Laterality: Left;   CYSTOSCOPY WITH RETROGRADE PYELOGRAM, URETEROSCOPY AND STENT PLACEMENT Bilateral 03/01/2020   Procedure: CYSTOSCOPY WITH RETROGRADE PYELOGRAM, URETEROSCOPY AND STENT PLACEMENT;  Surgeon: [name], MD;  Location: [facility];  Service: Urology;  Laterality: Bilateral;  75 MINS   CYSTOSCOPY/RETROGRADE/URETEROSCOPY     1 year ago at [place] in [place] Right 11/24/2017   Procedure: CYSTOSCOPY/URETEROSCOPY/ RETROGRADE/STENT REMOVAL;  Surgeon: Raynelle Bring, MD;  Location: [facility];  Service: Urology;  Laterality: Right;   HOLMIUM LASER APPLICATION Bilateral 0/12/270   Procedure: HOLMIUM LASER APPLICATION;  Surgeon: [name], MD;  Location: [facility];  Service: Urology;  Laterality: Bilateral;   prosatectomy     Patient Active Problem List   Diagnosis Date Noted   History of pulmonary embolism 10/10/2021   HTN (hypertension) 10/10/2021   Chronic diastolic CHF (congestive heart failure) ([place]) 10/10/2021   GERD (gastroesophageal reflux disease) 10/10/2021   Ankylosing spondylitis ([place]) 10/10/2021   CKD (chronic kidney disease), stage III ([place]) 10/10/2021   Hyperlipidemia 10/10/2021   CAD (coronary artery disease)  10/10/2021   Periprosthetic fracture of shaft of femur 10/10/2021   Other fatigue 06/05/2021   CAD (coronary artery disease) 35/32/9924   Diastolic CHF ([place]) 26/83/4196   SOB (shortness of breath) on exertion 03/26/2021   Chronic anticoagulation 03/26/2021   Anemia in chronic kidney disease 01/25/2021   Acquired coagulation disorder ([place]) 12/12/2020   Senile purpura ([place]) 12/12/2020   S/P lumbar fusion 08/03/2020   Thin skin 07/25/2020   Aortic atherosclerosis ([place]) 05/17/2020   B12 deficiency 05/17/2020   Iliac artery aneurysm ([place]) 05/17/2020   Osteoporosis 04/04/2020   Idiopathic neuropathy  01/28/2020   Recurrent falls 01/28/2020   Chronic kidney disease (CKD), stage III (moderate) ([place]) 01/19/2020   Pulmonary emboli ([place]) 01/14/2020   GERD (gastroesophageal reflux disease) 12/22/2019   Essential tremor 12/22/2019   Femoral condyle fracture ([place]) 07/15/2019   Gross hematuria 06/14/2019   Hyperlipidemia 11/11/2017   Kidney stone 11/05/2017   Community acquired pneumonia of right upper lobe of lung 11/05/2017   Right ureteral stone 11/05/2017   History of total right hip replacement 03/08/2017   History of prostate cancer 03/08/2017   Personal history of prostate cancer 02/28/2017   High risk medication use 02/28/2017   DDD (degenerative disc disease), thoracic 02/28/2017   Ataxia 05/18/2016   Vestibular disequilibrium 05/18/2016   Ankylosing spondylitis of cervical region ([place]) 05/24/2014   Lung nodule, solitary 05/24/2014   Lumbosacral stenosis 06/05/2013   Peripheral edema 06/08/2012   Subcortical microvascular ischemic occlusive disease 08/13/2010   Extrinsic asthma 04/07/2009   Backache 04/21/2008   History of peptic ulcer disease 04/21/2008   Essential hypertension 10/29/2007    PCP: [name], MD  REFERRING PROVIDER: [name], MD  REFERRING DIAG:  M54.50,G89.29 (ICD-10-CM) - Chronic bilateral low back pain without sciatica  R26.89 (ICD-10-CM) - Other abnormalities of gait and mobility    THERAPY DIAG:  Pain in right hip  Pain in right leg  Difficulty in walking, not elsewhere classified  Muscle weakness (generalized)  ONSET DATE: October 10, 2021 recent fall causing fracture  SUBJECTIVE:                                                                                                                                                                                           SUBJECTIVE STATEMENT: 52 years ago I had bone cancer and had a prosthetic in my Rt femur. October 10, 2021 I fell and shattered what little bone I had left in  the right leg. Had a new prosthetic placed. Was in the hospital, IP rehab and home health. At this point I can pretty much take care of  myself but have a ways to go to get strength back. Every other day I walk around the neighborhood with home health aid. This month my goal is to do the loop twice- around 2k steps/ 1 mile. Just started using cane rather than walker. Last Wed I did 8 steps without any AD. Going to try to go back to water walking class at [facility]. Neuropathy in bil feet and hands.    PERTINENT HISTORY:  Stenosis, Rt THA 69 yr ago with h/o periprosthetic fx & revision in 2022, h/o back surgery   PAIN:  Are you having pain? Yes NPRS scale: 6/10 at rest Pain location: Rt hip, groin, into knee  Aggravating factors: too much activity Relieving factors: rest  PRECAUTIONS: Posterior hip  WEIGHT BEARING RESTRICTIONS No  FALLS:  Has patient fallen in last 6 months? Yes, Number of falls: 1  LIVING ENVIRONMENT: Lives with: lives with their family and lives with their spouse Lives in: House/apartment Stairs: Yes; lives on main floor Has following equipment at home: Single point cane, Environmental consultant - 4 wheeled, and toilet seat  OCCUPATION: retired  PLOF: Independent  PATIENT GOALS build up strength, get rid of assistance for toileting and getting in/out of bed, no AD in gait, stairs, Sons wedding in [place] first of June   OBJECTIVE:   PATIENT SURVEYS:  LEFS 39  SCREENING FOR RED FLAGS: No significant findings  COGNITION:  Overall cognitive status: Within functional limits for tasks assessed     SENSATION:  Bil hand/feet neuropathy    LUMBARAROM/PROM  A/PROM A/PROM  03/06/2022  Flexion   Extension   Right lateral flexion   Left lateral flexion   Right rotation   Left rotation    (Blank rows = not tested)   LE MMT:  MMT Right 03/06/2022  Hip flexion 4/5  Hip extension   Hip abduction   Hip adduction   Hip internal rotation   Hip external rotation   Knee  flexion 3/5  Knee extension 3/5  Ankle dorsiflexion   Ankle plantarflexion   Ankle inversion   Ankle eversion    (Blank rows = not tested)    FUNCTIONAL TESTS:  5 times sit to stand: 38s Berg Balance Scale: 33/56  GAIT:  Comments: SPC in Lt hand, rt leg abducted and externally rotated, antalgic on Rt with pressure placed through cane    TODAY'S TREATMENT  Evaluation and testing, discussion of POC   PATIENT EDUCATION:  Education details: Anatomy of condition, POC, HEP, exercise form/rationale, aquatics & exercise  Person educated: Patient and Spouse Education method: Explanation, Demonstration, Tactile cues, and Verbal cues Education comprehension: verbalized understanding, returned demonstration, verbal cues required, tactile cues required, and needs further education   HOME EXERCISE PROGRAM: To be established  ASSESSMENT:  CLINICAL IMPRESSION: Patient is a 76 y.o. M who was seen today for physical therapy evaluation and treatment for pain in right leg s/p fall and revision of hip prosthetic.    OBJECTIVE IMPAIRMENTS Abnormal gait, decreased activity tolerance, decreased balance, difficulty walking, decreased strength, impaired sensation, improper body mechanics, postural dysfunction, and pain.   ACTIVITY LIMITATIONS cleaning, community activity, driving, meal prep, laundry, and shopping.   PERSONAL FACTORS  see problem list  are also affecting patient's functional outcome.    REHAB POTENTIAL: Good  CLINICAL DECISION MAKING: Unstable/unpredictable  EVALUATION COMPLEXITY: High   GOALS: Goals reviewed with patient? Yes  SHORT TERM GOALS:  Able to enter/exit pool independently with use of hand rails Baseline: will begin  training Target date: 04/03/2022 Goal status: INITIAL  2.  Good tolerance to exercise without limitations in next day due to pain Baseline: will establish proper working level Target date: 04/03/2022 Goal status: INITIAL  3.  Able to  shift weight to perform step taps with UE assist Baseline: unable at eval Target date: 04/03/2022 Goal status: INITIAL  4.  Able to ambulate with centered weight distribution Baseline: at eval- leg abducted and trunk lean Target date: 04/03/2022 Goal status: INITIAL    LONG TERM GOALS:  BERG to improve by MDC of 6 points Baseline: 33 at eval Target date:  07/06/22 Goal status: INITIAL  2.  Pt will ambulate safely around his home wihtout use of AD Baseline: using cane and walker at eval Target date: 07/06/22 Goal status: INITIAL  3.  Able to navigate stairs in home safely and confidently Baseline: unable at eval Target date: 07/06/22 Goal status: INITIAL  4.  LEFS to improve by MDC Baseline: will retest  at appropriate time Target date: 07/06/22 Goal status: INITIAL  5.  Pt will demo 5TSTS in 20s or less Baseline: 38s at eval Target date: 07/06/22 Goal status: INITIAL  6.  Pt will be able to rid home of aid devices Baseline: using devices such as elevated toilet seat at eval Target date: 07/06/22 Goal status: INITIAL  PLAN: PT FREQUENCY: 1-2x/week  PT DURATION: other: 4 months  PLANNED INTERVENTIONS: Therapeutic exercises, Therapeutic activity, Neuromuscular re-education, Balance training, Gait training, Patient/Family education, Joint mobilization, Stair training, Aquatic Therapy, Electrical stimulation, Cryotherapy, Moist heat, Taping, and Manual therapy  PLAN FOR NEXT SESSION: begin aquatics- gross Le strength, weight acceptance  Wandra Babin C. Shell Blanchette PT, DPT 03/06/22 12:11 PM

## 2022-03-06 ENCOUNTER — Ambulatory Visit (HOSPITAL_BASED_OUTPATIENT_CLINIC_OR_DEPARTMENT_OTHER): Payer: Medicare Other | Attending: Family Medicine | Admitting: Physical Therapy

## 2022-03-06 ENCOUNTER — Encounter (HOSPITAL_BASED_OUTPATIENT_CLINIC_OR_DEPARTMENT_OTHER): Payer: Self-pay | Admitting: Physical Therapy

## 2022-03-06 ENCOUNTER — Other Ambulatory Visit: Payer: Self-pay

## 2022-03-06 DIAGNOSIS — M6281 Muscle weakness (generalized): Secondary | ICD-10-CM | POA: Diagnosis not present

## 2022-03-06 DIAGNOSIS — G8929 Other chronic pain: Secondary | ICD-10-CM | POA: Insufficient documentation

## 2022-03-06 DIAGNOSIS — R2689 Other abnormalities of gait and mobility: Secondary | ICD-10-CM | POA: Diagnosis not present

## 2022-03-06 DIAGNOSIS — M545 Low back pain, unspecified: Secondary | ICD-10-CM | POA: Insufficient documentation

## 2022-03-06 DIAGNOSIS — M25551 Pain in right hip: Secondary | ICD-10-CM | POA: Insufficient documentation

## 2022-03-06 DIAGNOSIS — R262 Difficulty in walking, not elsewhere classified: Secondary | ICD-10-CM | POA: Diagnosis not present

## 2022-03-06 DIAGNOSIS — M79604 Pain in right leg: Secondary | ICD-10-CM | POA: Insufficient documentation

## 2022-03-06 NOTE — Progress Notes (Incomplete)
Phone [phone] Virtual visit via Video note   Subjective:  Chief complaint: No chief complaint on file.   This visit type was conducted due to national recommendations for restrictions regarding the COVID-19 Pandemic (e.g. social distancing).  This format is felt to be most appropriate for this patient at this time balancing risks to patient and risks to population by having him in for in person visit.  No physical exam was performed (except for noted visual exam or audio findings with Telehealth visits).    Our team/I connected with [name] at 11:20 AM EDT by a video enabled telemedicine application ([email or web address] or caregility through epic) and verified that I am speaking with the correct person using two identifiers.  Location patient: [facility] Location provider: [facility], office Persons participating in the virtual visit:  patient  Our team/I discussed the limitations of evaluation and management by telemedicine and the availability of in person appointments. In light of current covid-19 pandemic, patient also understands that we are trying to protect them by minimizing in office contact if at all possible.  The patient expressed consent for telemedicine visit and agreed to proceed. Patient understands insurance will be billed.   Past Medical History-  Patient Active Problem List   Diagnosis Date Noted   History of pulmonary embolism 10/10/2021   HTN (hypertension) 10/10/2021   Chronic diastolic CHF (congestive heart failure) ([place]) 10/10/2021   GERD (gastroesophageal reflux disease) 10/10/2021   Ankylosing spondylitis ([place]) 10/10/2021   CKD (chronic kidney disease), stage III ([place]) 10/10/2021   Hyperlipidemia 10/10/2021   CAD (coronary artery disease) 10/10/2021   Periprosthetic fracture of shaft of femur 10/10/2021   Other fatigue 06/05/2021   CAD (coronary artery disease) 21/19/4174   Diastolic CHF ([place]) 08/12/4817   SOB (shortness of breath) on exertion 03/26/2021    Chronic anticoagulation 03/26/2021   Anemia in chronic kidney disease 01/25/2021   Acquired coagulation disorder ([place]) 12/12/2020   Senile purpura ([place]) 12/12/2020   S/P lumbar fusion 08/03/2020   Thin skin 07/25/2020   Aortic atherosclerosis ([place]) 05/17/2020   B12 deficiency 05/17/2020   Iliac artery aneurysm ([place]) 05/17/2020   Osteoporosis 04/04/2020   Idiopathic neuropathy 01/28/2020   Recurrent falls 01/28/2020   Chronic kidney disease (CKD), stage III (moderate) ([place]) 01/19/2020   Pulmonary emboli ([place]) 01/14/2020   GERD (gastroesophageal reflux disease) 12/22/2019   Essential tremor 12/22/2019   Femoral condyle fracture ([place]) 07/15/2019   Gross hematuria 06/14/2019   Hyperlipidemia 11/11/2017   Kidney stone 11/05/2017   Community acquired pneumonia of right upper lobe of lung 11/05/2017   Right ureteral stone 11/05/2017   History of total right hip replacement 03/08/2017   History of prostate cancer 03/08/2017   Personal history of prostate cancer 02/28/2017   High risk medication use 02/28/2017   DDD (degenerative disc disease), thoracic 02/28/2017   Ataxia 05/18/2016   Vestibular disequilibrium 05/18/2016   Ankylosing spondylitis of cervical region ([place]) 05/24/2014   Lung nodule, solitary 05/24/2014   Lumbosacral stenosis 06/05/2013   Peripheral edema 06/08/2012   Subcortical microvascular ischemic occlusive disease 08/13/2010   Extrinsic asthma 04/07/2009   Backache 04/21/2008   History of peptic ulcer disease 04/21/2008   Essential hypertension 10/29/2007    Medications- reviewed and updated Current Outpatient Medications  Medication Sig Dispense Refill   acetaminophen (TYLENOL) 325 MG tablet Take 2 tablets (650 mg total) by mouth every 6 (six) hours as needed for mild pain or moderate pain.     apixaban (ELIQUIS)  2.5 MG TABS tablet Take 1 tablet (2.5 mg total) by mouth 2 (two) times daily.     atorvastatin (LIPITOR) 20 MG tablet Take 1 tablet (20 mg total)  by mouth at bedtime. 90 tablet 3   Calcium Carb-Cholecalciferol 600-10 MG-MCG TABS Take 1 tablet by mouth 2 (two) times daily.     carvedilol (COREG) 25 MG tablet Take 12.5 mg by mouth 2 (two) times daily.     Cholecalciferol (VITAMIN D3) 50 MCG (2000 UT) capsule Take 2,000 Units by mouth in the morning and at bedtime.      DULoxetine (CYMBALTA) 30 MG capsule Take 30 mg by mouth daily.     ferrous sulfate 325 (65 FE) MG tablet Take 325 mg by mouth every Monday, Wednesday, and Friday.     fluticasone (FLONASE) 50 MCG/ACT nasal spray Place 1-2 sprays into both nostrils daily as needed for allergies or rhinitis.     gabapentin (NEURONTIN) 100 MG capsule Take 100 mg by mouth every morning.     gabapentin (NEURONTIN) 300 MG capsule Take 300 mg by mouth at bedtime.     ibuprofen (ADVIL) 200 MG tablet Take 600 mg by mouth every 6 (six) hours as needed for fever, headache or mild pain.     indapamide (LOZOL) 1.25 MG tablet Take 1.25 mg by mouth daily.     methocarbamol (ROBAXIN) 500 MG tablet Take 1 tablet by mouth at bedtime as needed for muscle spasms.     metoCLOPramide (REGLAN) 5 MG tablet TAKE 1 TABLET (5 MG TOTAL) BY MOUTH 3 (THREE) TIMES DAILY BEFORE MEALS. 90 tablet 1   Multiple Vitamins-Minerals (MULTIVITAMIN WITH MINERALS) tablet Take 1 tablet by mouth daily.     pantoprazole (PROTONIX) 40 MG tablet Take 40 mg by mouth 2 (two) times daily before a meal.     traMADol (ULTRAM) 50 MG tablet Take 50 mg by mouth as needed.     No current facility-administered medications for this visit.     Objective:  There were no vitals taken for this visit. self reported vitals Gen: NAD, resting comfortably Lungs: nonlabored, normal respiratory rate *** Skin: appears dry, no obvious rash     Assessment and Plan   ***sacral ala fracture insufficiency- already on osteoporosis meds march 2021- o nxt 04/02/20  ***back surgery june 30th fusion Dr. Gerilyn Nestle [facility]- much improved afterward!!! ***  remarkable turnaround- wheelchair with wife to no assistive device. later regressed  *** Slightly improved anemia noted. I may check iron stores again next visit  # Anemia- hematology had not been overly concerned. Eliquis likely contributed. On iron MWF-    #Diastolic CHF S: Patient had not required Lasix recently.  Weight was actually trended down-did had some edema with secondary activity - had seen nutritionist and found helpful- working with healthy weight to wellness-continued to lose weight A/P: ****   #hypertension S: medication: lozol 1.25 mg daily and coreg 25 mg twice daily, lasix as needed-has not needed lately Home readings #s: *** BP Readings from Last 3 Encounters:  01/17/22 124/70  12/23/21 137/69  12/19/21 130/62  A/P: ***   #hyperlipidemia #coronary artery calcium score of 152 so LDL goal under 70- at goal #aortic atherosclerosis S: Medication: Atorvastatin 20 mg daily, since on eliquis will remain off aspirin  Lab Results  Component Value Date   CHOL 101 07/11/2021   HDL 34 (A) 07/11/2021   LDLCALC 50 07/11/2021   LDLDIRECT 145.4 06/27/2011   TRIG 84 07/11/2021   CHOLHDL  3 04/03/2021   A/P: ***  #Chronic kidney disease stage III S: GFR is typically in the 40s or 50s *** range-most recent range on consistent actually above 60 -Patient knows to avoid NSAIDs especially with being on Eliquis A/P: ***   #Chronic DVT/PE- continued chronic Eliquis 2.5 mg twice daily as recommended by hematology/oncology-[facility]-Dr. Alvy Bimler recommended ongoing therapy on 07/20/21 due to risk from sedentary activity -Appears controlled without recurrence-continue current medication   # cymbalta mainly for arthritis - not depression - [facility]- seen for bilateral hand pain with stiffness that responded to prednisone- denies depression or anhedonia. Not diagnosed with rheumatological issues. He was not sure how much  helping  Recommended follow up:  Future Appointments  Date Time Provider [place]  03/08/2022  8:00 AM Carlson-Long, Anderson [place] L [facility]  03/13/2022  3:00 PM Carlson-Long, Anderson [place] L [facility]  03/15/2022 11:00 AM Carlson-Long, Stephani Police [facility]  03/20/2022  3:00 PM Carlson-Long, Stephani Police [facility]  03/28/2022  3:15 PM Selinda Eon, PT [facility]  04/03/2022 11:20 AM [name], MD [facility]  04/03/2022  3:00 PM Vedia Pereyra, PT [facility]  04/05/2022  1:45 PM Kerman Passey, Janett Billow, PT [facility]  04/08/2022  2:30 PM Kerman Passey, Janett Billow, PT [facility]  04/10/2022 11:45 AM Carlson-Long, Stephani Police [facility]  04/17/2022 11:45 AM Carlson-Long, Stephani Police [facility]  04/19/2022 11:00 AM Selinda Eon, PT [facility]  04/24/2022 11:45 AM Carlson-Long, Stephani Police [facility]  04/26/2022 11:00 AM Selinda Eon, PT [facility]  05/01/2022 11:45 AM Carlson-Long, Stephani Police [facility]  05/03/2022 11:45 AM Carlson-Long, Stephani Police [facility]  05/08/2022 11:45 AM Carlson-Long, Stephani Police [facility]  05/10/2022 11:00 AM Selinda Eon, PT [facility]  05/15/2022 11:45 AM Carlson-Long, Anderson Malta L [facility]  05/17/2022 11:00 AM Selinda Eon, PT [facility]  05/22/2022 11:45 AM Carlson-Long, Anderson [place] L [facility]  05/24/2022 11:00 AM Selinda Eon, PT [facility]  05/29/2022 11:45 AM Carlson-Long, Anderson [place] L [facility]  05/31/2022 11:00 AM Selinda Eon, PT [facility]  06/07/2022 11:00 AM Selinda Eon, PT [facility]  06/12/2022 11:45 AM Carlson-Long, Anderson [place] L [facility]  06/14/2022 11:00 AM Selinda Eon, PT [facility]  06/19/2022 11:45 AM Carlson-Long, Anderson Malta L [facility]  06/21/2022 11:00 AM Selinda Eon, PT [facility]  06/26/2022 11:45 AM Carlson-Long, Stephani Police [facility]  06/28/2022 11:00 AM Selinda Eon, PT [facility]  07/03/2022 11:45 AM Carlson-Long, Anderson Malta L [facility]  07/05/2022 11:00 AM Selinda Eon,  PT [facility]  07/10/2022 11:45 AM Carlson-Long, Anderson Malta L [facility]  07/12/2022 11:00 AM Selinda Eon, PT [facility]  07/17/2022 11:45 AM Carlson-Long, Stephani Police [facility]  07/19/2022 11:00 AM Selinda Eon, PT [facility]  07/24/2022  1:30 PM Vedia Pereyra, PT [facility]  07/26/2022 11:00 AM Selinda Eon, PT [facility]  01/31/2023 11:45 AM [facility] HEALTH COACH [facility]    Lab/Order associations: No diagnosis found.  No orders of the defined types were placed in this encounter.   Time Spent: *** minutes of total time (10:33 AM***- 10:33 AM***) was spent on the date of the encounter performing the following actions: chart review prior to seeing the patient, obtaining history, performing a medically necessary exam, counseling on the treatment plan, placing orders, and documenting in our EHR.    I,Jada Bradford,acting as a scribe for [name], MD.,have documented all relevant documentation on the behalf of Annie Main  Yong Channel, MD,as directed by  [name], MD while in the presence of [name], MD.  *** Return precautions advised.  Burnett Corrente

## 2022-03-07 ENCOUNTER — Ambulatory Visit (INDEPENDENT_AMBULATORY_CARE_PROVIDER_SITE_OTHER): Payer: Medicare Other | Admitting: Family

## 2022-03-07 VITALS — BP 146/80 | HR 56 | Temp 98.4°F | Ht 71.0 in | Wt 233.8 lb

## 2022-03-07 DIAGNOSIS — R053 Chronic cough: Secondary | ICD-10-CM | POA: Diagnosis not present

## 2022-03-07 MED ORDER — AZITHROMYCIN 250 MG PO TABS: ORAL_TABLET | ORAL | 0 refills | Status: AC

## 2022-03-07 NOTE — Progress Notes (Signed)
? ?Subjective:  ? ? ? Patient ID: Bruce Ball, male    DOB: 1946-01-20, 76 y.o.   MRN: [id number] ? ?Chief Complaint  ?Patient presents with  ? Cough  ?  Pt c/o severe cough for 2 wks, productive cough w/ yellow mucus; pt having sinus issues but scheduled for CT at end of March  ? ?HPI: ?Cough: Patient complains of productive cough with sputum described as yellow.  Symptoms began 2 weeks ago.  The cough is without wheezing, dyspnea or hemoptysis, productive of green/yellow sputum and is aggravated by reclining position Associated symptoms include:change in voice and heartburn.  Patient does not have a history of asthma. Patient does not have a history of environmental allergens. Patient has not recent travel. Patient does have a history of smoking. Patient  has not had previous Chest X-ray recently.  ? ?Health Maintenance Due  ?Topic Date Due  ? Fecal DNA (Cologuard)  Never done  ? ? ?Past Medical History:  ?Diagnosis Date  ? Ankylosing spondylitis ([place])   ? Arthritis of right knee   ? Basal cell carcinoma   ? Chronic back pain greater than 3 months duration   ? Chronic leg pain   ? CKD (chronic kidney disease), stage II   ? Coronary artery calcification seen on CAT scan 03/27/2021  ? Deafness in right ear   ? Dyspnea   ? cause by PE  ? Essential tremor   ? Extrinsic asthma, unspecified 04/07/2009  ? PT DENIES  ? GERD (gastroesophageal reflux disease)   ? Hearing loss   ? Hip problem   ? History of kidney stones   ? HYPERTENSION 10/29/2007  ? HYPOTENSION 08/13/2010  ? LOW BACK PAIN 04/21/2008  ? Mild aortic stenosis   ? NEPHROLITHIASIS, HX OF 04/21/2008  ? Neuropathy   ? Osteopenia   ? Other hyperlipidemia   ? Peptic ulcer   ? Pneumonia   ? PUD, HX OF 04/21/2008  ? Pulmonary emboli ([place]) 2021  ? noted on CT 12-2019  ? Renal disorder   ? Stage 3 chronic kidney disease ([place])   ? Thyroid nodule   ? ? ?Past Surgical History:  ?Procedure Laterality Date  ? BACK SURGERY    ? bone cancer  1970  ? rt femur removed and steel rod  placed  ? CYSTOSCOPY WITH RETROGRADE PYELOGRAM, URETEROSCOPY AND STENT PLACEMENT Right 11/06/2017  ? Procedure: CYSTOSCOPY WITH  STENT PLACEMENT RIGHT;  Surgeon: Raynelle Bring, MD;  Location: [facility];  Service: Urology;  Laterality: Right;  ? CYSTOSCOPY WITH RETROGRADE PYELOGRAM, URETEROSCOPY AND STENT PLACEMENT Left 06/09/2019  ? Procedure: CYSTOSCOPY WITH RETROGRADE PYELOGRAM, URETEROSCOPY AND STENT PLACEMENT X2;  Surgeon: [name], MD;  [facility]: [facility];  Service: Urology;  Laterality: Left;  ? CYSTOSCOPY WITH RETROGRADE PYELOGRAM, URETEROSCOPY AND STENT PLACEMENT Bilateral 03/01/2020  ? Procedure: CYSTOSCOPY WITH RETROGRADE PYELOGRAM, URETEROSCOPY AND STENT PLACEMENT;  Surgeon: [name], MD;  [facility]: [facility];  Service: Urology;  Laterality: Bilateral;  75 MINS  ? CYSTOSCOPY/RETROGRADE/URETEROSCOPY    ? 1 year ago at [place] in [place]  ? CYSTOSCOPY/URETEROSCOPY/HOLMIUM LASER Right 11/24/2017  ? Procedure: CYSTOSCOPY/URETEROSCOPY/ RETROGRADE/STENT REMOVAL;  Surgeon: Raynelle Bring, MD;  [facility];  Service: Urology;  Laterality: Right;  ? HOLMIUM LASER APPLICATION Bilateral 04/02/346  ? Procedure: HOLMIUM LASER APPLICATION;  Surgeon: [name], MD;  [facility]: [facility];  Service: Urology;  Laterality: Bilateral;  ? prosatectomy    ? ? ?Outpatient Medications Prior to Visit  ?Medication Sig Dispense  Refill  ? acetaminophen (TYLENOL) 325 MG tablet Take 2 tablets (650 mg total) by mouth every 6 (six) hours as needed for mild pain or moderate pain.    ? apixaban (ELIQUIS) 2.5 MG TABS tablet Take 1 tablet (2.5 mg total) by mouth 2 (two) times daily.    ? atorvastatin (LIPITOR) 20 MG tablet Take 1 tablet (20 mg total) by mouth at bedtime. 90 tablet 3  ? Calcium Carb-Cholecalciferol 600-10 MG-MCG TABS Take 1 tablet by mouth 2 (two) times daily.    ? carvedilol (COREG) 25 MG tablet Take 12.5 mg by mouth 2 (two) times daily.    ? Cholecalciferol (VITAMIN D3) 50 MCG (2000 UT) capsule Take 2,000 Units by  mouth in the morning and at bedtime.     ? DULoxetine (CYMBALTA) 30 MG capsule Take 30 mg by mouth daily.    ? ferrous sulfate 325 (65 FE) MG tablet Take 325 mg by mouth every Monday, Wednesday, and Friday.    ? fluticasone (FLONASE) 50 MCG/ACT nasal spray Place 1-2 sprays into both nostrils daily as needed for allergies or rhinitis.    ? gabapentin (NEURONTIN) 100 MG capsule Take 100 mg by mouth every morning.    ? gabapentin (NEURONTIN) 300 MG capsule Take 300 mg by mouth at bedtime.    ? methocarbamol (ROBAXIN) 500 MG tablet Take 1 tablet by mouth at bedtime as needed for muscle spasms.    ? metoCLOPramide (REGLAN) 5 MG tablet TAKE 1 TABLET (5 MG TOTAL) BY MOUTH 3 (THREE) TIMES DAILY BEFORE MEALS. 90 tablet 1  ? pantoprazole (PROTONIX) 40 MG tablet Take 40 mg by mouth 2 (two) times daily before a meal.    ? traMADol (ULTRAM) 50 MG tablet Take 50 mg by mouth as needed.    ? ibuprofen (ADVIL) 200 MG tablet Take 600 mg by mouth every 6 (six) hours as needed for fever, headache or mild pain. (Patient not taking: Reported on 03/07/2022)    ? indapamide (LOZOL) 1.25 MG tablet Take 1.25 mg by mouth daily. (Patient not taking: Reported on 03/07/2022)    ? Multiple Vitamins-Minerals (MULTIVITAMIN WITH MINERALS) tablet Take 1 tablet by mouth daily. (Patient not taking: Reported on 03/07/2022)    ? ?No facility-administered medications prior to visit.  ? ? ?Allergies  ?Allergen Reactions  ? Erythromycin   ? Lisinopril   ?  Reaction unknown; says lowered blood pressure too much  ? ? ? ?   ?Objective:  ?  ?Physical Exam ?Vitals and nursing note reviewed.  ?Constitutional:   ?   General: He is not in acute distress. ?   Appearance: Normal appearance.  ?HENT:  ?   Head: Normocephalic.  ?   Mouth/Throat:  ?   Mouth: Mucous membranes are moist.  ?   Pharynx: No pharyngeal swelling, oropharyngeal exudate, posterior oropharyngeal erythema or uvula swelling.  ?Cardiovascular:  ?   Rate and Rhythm: Normal rate and regular rhythm.   ?Pulmonary:  ?   Effort: Pulmonary effort is normal.  ?   Breath sounds: Normal breath sounds.  ?Musculoskeletal:     ?   General: Normal range of motion.  ?   Cervical back: Normal range of motion.  ?Skin: ?   General: Skin is warm and dry.  ?Neurological:  ?   Mental Status: He is alert and oriented to person, place, and time.  ?Psychiatric:     ?   Mood and Affect: Mood normal.  ? ? ?BP (!) 146/80 (BP  Location: Left Arm)   Pulse (!) 56   Temp 98.4 ?F (36.9 ?C) (Temporal)   Ht '5\' 11"'$  (1.803 m)   Wt 233 lb 12.8 oz (106.1 kg)   SpO2 97%   BMI 32.61 kg/m?  ?Wt Readings from Last 3 Encounters:  ?03/07/22 233 lb 12.8 oz (106.1 kg)  ?01/17/22 217 lb (98.4 kg)  ?12/23/21 216 lb (98 kg)  ? ? ?   ?Assessment & Plan:  ? ?Problem List Items Addressed This Visit   ?None ?Visit Diagnoses   ? ? Persistent cough    -  Primary  ? productive, reports having for 2 weeks. Denies fever.  States he was chewing on cashews and thinks this worsened his reflux. cg with him states he does gag, cough at times when eating, and does not eat small bites. Advised with reflux he has to take small bites, chew food very well, and drink sips after each bite to avoid aspirating. He is on high dose PPI and H2 blocker which should control his reflux sx, but cg states he has a hx of hiatal hernia. Advised he should f/u w/GI to see if this could be etiology of his worsening reflux sx. Sending Zpack, advised on use & SE and ordering CXR. ? ?Relevant Medications  ? azithromycin (ZITHROMAX) 250 MG tablet  ? Other Relevant Orders  ? DG Chest 2 View  ? ?  ? ?Meds ordered this encounter  ?Medications  ? azithromycin (ZITHROMAX) 250 MG tablet  ?  Sig: Take 2 tablets on day 1, then 1 tablet daily on days 2 through 5  ?  Dispense:  6 tablet  ?  Refill:  0  ?  Order Specific Question:   Supervising Provider  ?  Answer:   ANDY, CAMILLE L [[id number]]  ? ? ?Jeanie Sewer, NP ? ?

## 2022-03-07 NOTE — Patient Instructions (Addendum)
It was very nice to see you today! ? ?Please go to the [place] at [facility] for a chest xray. We will call you with the results. ?I have sent an antibiotic for you to start to treat your cough. Drink plenty of water! ? ? ? ?PLEASE NOTE: ? ?If you had any lab tests please let us know if you have not heard back within a few days. You may see your results on MyChart before we have a chance to review them but we will give you a call once they are reviewed by [name]. If we ordered any referrals today, please let us know if you have not heard from their office within the next week.  ? ?Please try these tips to maintain a healthy lifestyle: ? ?Eat most of your calories during the day when you are active. Eliminate processed foods including packaged sweets (pies, cakes, cookies), reduce intake of potatoes, white bread, white pasta, and white rice. Look for whole grain options, oat flour or almond flour. ? ?Each meal should contain half fruits/vegetables, one quarter protein, and one quarter carbs (no bigger than a computer mouse). ? ?Cut down on sweet beverages. This includes juice, soda, and sweet tea. Also watch fruit intake, though this is a healthier sweet option, it still contains natural sugar! Limit to 3 servings daily. ? ?Drink at least 1 glass of water with each meal and aim for at least 8 glasses per day ? ?Exercise at least 150 minutes every week.  ? ?

## 2022-03-08 ENCOUNTER — Other Ambulatory Visit: Payer: Self-pay

## 2022-03-08 ENCOUNTER — Ambulatory Visit (HOSPITAL_BASED_OUTPATIENT_CLINIC_OR_DEPARTMENT_OTHER)
Admission: RE | Admit: 2022-03-08 | Discharge: 2022-03-08 | Disposition: A | Discharge status: Home / Self Care | Payer: Medicare Other | Source: Ambulatory Visit | Attending: Family | Admitting: Family

## 2022-03-08 ENCOUNTER — Ambulatory Visit (HOSPITAL_BASED_OUTPATIENT_CLINIC_OR_DEPARTMENT_OTHER): Payer: Medicare Other | Admitting: Physical Therapy

## 2022-03-08 ENCOUNTER — Encounter (HOSPITAL_BASED_OUTPATIENT_CLINIC_OR_DEPARTMENT_OTHER): Payer: Self-pay | Admitting: Physical Therapy

## 2022-03-08 DIAGNOSIS — M545 Low back pain, unspecified: Secondary | ICD-10-CM | POA: Diagnosis not present

## 2022-03-08 DIAGNOSIS — G8929 Other chronic pain: Secondary | ICD-10-CM | POA: Diagnosis not present

## 2022-03-08 DIAGNOSIS — R059 Cough, unspecified: Secondary | ICD-10-CM | POA: Diagnosis not present

## 2022-03-08 DIAGNOSIS — M79604 Pain in right leg: Secondary | ICD-10-CM | POA: Diagnosis not present

## 2022-03-08 DIAGNOSIS — I2699 Other pulmonary embolism without acute cor pulmonale: Secondary | ICD-10-CM | POA: Diagnosis not present

## 2022-03-08 DIAGNOSIS — M25551 Pain in right hip: Secondary | ICD-10-CM | POA: Diagnosis not present

## 2022-03-08 DIAGNOSIS — R262 Difficulty in walking, not elsewhere classified: Secondary | ICD-10-CM | POA: Diagnosis not present

## 2022-03-08 DIAGNOSIS — R053 Chronic cough: Secondary | ICD-10-CM | POA: Diagnosis not present

## 2022-03-08 DIAGNOSIS — M6281 Muscle weakness (generalized): Secondary | ICD-10-CM | POA: Diagnosis not present

## 2022-03-08 NOTE — Therapy (Signed)
OUTPATIENT PHYSICAL THERAPY TREATMENT NOTE   Patient Name: Bruce Ball MRN: [id number] DOB:Mar 19, 1946, 76 y.o., male Today's Date: 03/08/2022  PCP: [name], MD REFERRING PROVIDER: [name], MD   PT End of Session - 03/08/22 0820     Visit Number 2    Number of Visits 33    Date for PT Re-Evaluation 07/06/22    Authorization Type MCR    PT Start Time 0805    PT Stop Time 0845    PT Time Calculation (min) 40 min    Activity Tolerance Patient tolerated treatment well    Behavior During Therapy [facility] for tasks assessed/performed             Past Medical History:  Diagnosis Date   Ankylosing spondylitis ([place])    Arthritis of right knee    Basal cell carcinoma    Chronic back pain greater than 3 months duration    Chronic leg pain    CKD (chronic kidney disease), stage II    Coronary artery calcification seen on CAT scan 03/27/2021   Deafness in right ear    Dyspnea    cause by PE   Essential tremor    Extrinsic asthma, unspecified 04/07/2009   PT DENIES   GERD (gastroesophageal reflux disease)    Hearing loss    Hip problem    History of kidney stones    HYPERTENSION 10/29/2007   HYPOTENSION 08/13/2010   LOW BACK PAIN 04/21/2008   Mild aortic stenosis    NEPHROLITHIASIS, HX OF 04/21/2008   Neuropathy    Osteopenia    Other hyperlipidemia    Peptic ulcer    Pneumonia    PUD, HX OF 04/21/2008   Pulmonary emboli ([place]) 2021   noted on CT 12-2019   Renal disorder    Stage 3 chronic kidney disease (HCC)    Thyroid nodule    Past Surgical History:  Procedure Laterality Date   BACK SURGERY     bone cancer  1970   rt femur removed and steel rod placed   [place], URETEROSCOPY AND STENT PLACEMENT Right 11/06/2017   Procedure: CYSTOSCOPY WITH  STENT PLACEMENT RIGHT;  Surgeon: Raynelle Bring, MD;  [facility];  Service: Urology;  Laterality: Right;   CYSTOSCOPY WITH RETROGRADE PYELOGRAM, URETEROSCOPY AND STENT  PLACEMENT Left 06/09/2019   Procedure: CYSTOSCOPY WITH RETROGRADE PYELOGRAM, URETEROSCOPY AND STENT PLACEMENT X2;  Surgeon: [name], MD;  [facility];  Service: Urology;  Laterality: Left;   CYSTOSCOPY WITH RETROGRADE PYELOGRAM, URETEROSCOPY AND STENT PLACEMENT Bilateral 03/01/2020   Procedure: CYSTOSCOPY WITH RETROGRADE PYELOGRAM, URETEROSCOPY AND STENT PLACEMENT;  Surgeon: [name], MD;  [facility];  Service: Urology;  Laterality: Bilateral;  75 MINS   CYSTOSCOPY/RETROGRADE/URETEROSCOPY     1 year ago at [place] in [place] Right 11/24/2017   Procedure: CYSTOSCOPY/URETEROSCOPY/ RETROGRADE/STENT REMOVAL;  Surgeon: Raynelle Bring, MD;  [facility];  Service: Urology;  Laterality: Right;   HOLMIUM LASER APPLICATION Bilateral 2/0/2334   Procedure: HOLMIUM LASER APPLICATION;  Surgeon: [name], MD;  [facility];  Service: Urology;  Laterality: Bilateral;   prosatectomy     Patient Active Problem List   Diagnosis Date Noted   History of pulmonary embolism 10/10/2021   HTN (hypertension) 10/10/2021   Chronic diastolic CHF (congestive heart failure) ([place]) 10/10/2021   GERD (gastroesophageal reflux disease) 10/10/2021   Ankylosing spondylitis ([place]) 10/10/2021   CKD (chronic kidney disease), stage III (  [place]) 10/10/2021   Hyperlipidemia 10/10/2021   CAD (coronary artery disease) 10/10/2021   Periprosthetic fracture of shaft of femur 10/10/2021   Other fatigue 06/05/2021   CAD (coronary artery disease) 02/77/4128   Diastolic CHF ([place]) 78/67/6720   SOB (shortness of breath) on exertion 03/26/2021   Chronic anticoagulation 03/26/2021   Anemia in chronic kidney disease 01/25/2021   Acquired coagulation disorder ([place]) 12/12/2020   Senile purpura ([place]) 12/12/2020   S/P lumbar fusion 08/03/2020   Thin skin 07/25/2020   Aortic atherosclerosis ([place]) 05/17/2020   B12 deficiency 05/17/2020   Iliac artery aneurysm ([place])  05/17/2020   Osteoporosis 04/04/2020   Idiopathic neuropathy 01/28/2020   Recurrent falls 01/28/2020   Chronic kidney disease (CKD), stage III (moderate) ([place]) 01/19/2020   Pulmonary emboli ([place]) 01/14/2020   GERD (gastroesophageal reflux disease) 12/22/2019   Essential tremor 12/22/2019   Femoral condyle fracture ([place]) 07/15/2019   Gross hematuria 06/14/2019   Hyperlipidemia 11/11/2017   Kidney stone 11/05/2017   Community acquired pneumonia of right upper lobe of lung 11/05/2017   Right ureteral stone 11/05/2017   History of total right hip replacement 03/08/2017   History of prostate cancer 03/08/2017   Personal history of prostate cancer 02/28/2017   High risk medication use 02/28/2017   DDD (degenerative disc disease), thoracic 02/28/2017   Ataxia 05/18/2016   Vestibular disequilibrium 05/18/2016   Ankylosing spondylitis of cervical region ([place]) 05/24/2014   Lung nodule, solitary 05/24/2014   Lumbosacral stenosis 06/05/2013   Peripheral edema 06/08/2012   Subcortical microvascular ischemic occlusive disease 08/13/2010   Extrinsic asthma 04/07/2009   Backache 04/21/2008   History of peptic ulcer disease 04/21/2008   Essential hypertension 10/29/2007  REFERRING PROVIDER: [name], MD   REFERRING DIAG:  M54.50,G89.29 (ICD-10-CM) - Chronic bilateral low back pain without sciatica  R26.89 (ICD-10-CM) - Other abnormalities of gait and mobility      THERAPY DIAG:  Pain in right hip   Pain in right leg   Difficulty in walking, not elsewhere classified   Muscle weakness (generalized)   ONSET DATE: October 10, 2021 recent fall causing fracture   SUBJECTIVE:                                                                                                                                                                                            SUBJECTIVE STATEMENT: Pt reports he has set goals for himself, 10 steps without AD. Right now he is up to 8.  [place]      PERTINENT HISTORY:  Stenosis, Rt THA 22 yr  ago with h/o periprosthetic fx & revision in 2022, h/o back surgery    PAIN:  Are you having pain? yes NPRS scale: 2/10 Pain location: Rt hip, groin, into knee   Aggravating factors: too much activity Relieving factors: rest   PRECAUTIONS: Posterior hip   WEIGHT BEARING RESTRICTIONS No   FALLS:  Has patient fallen in last 6 months? Yes, Number of falls: 1     PATIENT GOALS build up strength, get rid of assistance for toileting and getting in/out of bed, no AD in gait, stairs, Sons wedding in [place] first of June     OBJECTIVE:    PATIENT SURVEYS:  LEFS 39   SCREENING FOR RED FLAGS: No significant findings   COGNITION:          Overall cognitive status: Within functional limits for tasks assessed                        SENSATION:          Bil hand/feet neuropathy       LUMBARAROM/PROM   A/PROM A/PROM  03/06/2022  Flexion    Extension    Right lateral flexion    Left lateral flexion    Right rotation    Left rotation     (Blank rows = not tested)     LE MMT:   MMT Right 03/06/2022  Hip flexion 4/5  Hip extension    Hip abduction    Hip adduction    Hip internal rotation    Hip external rotation    Knee flexion 3/5  Knee extension 3/5  Ankle dorsiflexion    Ankle plantarflexion    Ankle inversion    Ankle eversion     (Blank rows = not tested)       FUNCTIONAL TESTS:  5 times sit to stand: 38s Berg Balance Scale: 33/56   GAIT:   Comments: SPC in Lt hand, rt leg abducted and externally rotated, antalgic on Rt with pressure placed through cane       TODAY'S TREATMENT  Pt seen for aquatic therapy today.  Treatment took place in water 3.25-4 ft in depth at the [facility] pool. Temp of water was 91.  Pt entered/exited the pool via stairs with supervision with bilat rail.  Walking forward - with UE on wall, with UE on yellow noodle. Backward  walking and side stepping with UE on yellow noodle RLE heel raises x 20 Squats x 25 Hip abdct x 15 each leg Hip ext x 15 each leg Forward step ups x 15 each leg in 4'5" water with UE on wall   Pt requires buoyancy for support and to offload joints with strengthening exercises. Viscosity of the water is needed for resistance of strengthening; water current perturbations provides challenge to standing balance unsupported, requiring increased core activation.      PATIENT EDUCATION:  Education details: intro to [employer] & exercise   Person educated: Patient and Spouse Education method: Explanation, Demonstration, Tactile cues, and Verbal cues Education comprehension: verbalized understanding, returned demonstration, verbal cues required, tactile cues required, and needs further education     HOME EXERCISE PROGRAM: To be established   ASSESSMENT:   CLINICAL IMPRESSION: Pt is confident and independent in water setting, however needs supervision due to unsteadiness when switching directions.  He is hard of hearing (can only hear out of Lt ear).  He reported relief of some symptoms when submerged just below chest.  Goals  are ongoing.      OBJECTIVE IMPAIRMENTS Abnormal gait, decreased activity tolerance, decreased balance, difficulty walking, decreased strength, impaired sensation, improper body mechanics, postural dysfunction, and pain.    ACTIVITY LIMITATIONS cleaning, community activity, driving, meal prep, laundry, and shopping.    PERSONAL FACTORS  see problem list  are also affecting patient's functional outcome.      REHAB POTENTIAL: Good   CLINICAL DECISION MAKING: Unstable/unpredictable   EVALUATION COMPLEXITY: High     GOALS: Goals reviewed with patient? Yes   SHORT TERM GOALS:   Able to enter/exit pool independently with use of hand rails Baseline: will begin training Target date: 04/03/2022 Goal status: INITIAL   2.  Good tolerance to exercise without  limitations in next day due to pain Baseline: will establish proper working level Target date: 04/03/2022 Goal status: INITIAL   3.  Able to shift weight to perform step taps with UE assist Baseline: unable at eval Target date: 04/03/2022 Goal status: INITIAL   4.  Able to ambulate with centered weight distribution Baseline: at eval- leg abducted and trunk lean Target date: 04/03/2022 Goal status: INITIAL       LONG TERM GOALS:   BERG to improve by MDC of 6 points Baseline: 33 at eval Target date:  07/06/22 Goal status: INITIAL   2.  Pt will ambulate safely around his home wihtout use of AD Baseline: using cane and walker at eval Target date: 07/06/22 Goal status: INITIAL   3.  Able to navigate stairs in home safely and confidently Baseline: unable at eval Target date: 07/06/22 Goal status: INITIAL   4.  LEFS to improve by MDC Baseline: will retest  at appropriate time Target date: 07/06/22 Goal status: INITIAL   5.  Pt will demo 5TSTS in 20s or less Baseline: 38s at eval Target date: 07/06/22 Goal status: INITIAL   6.  Pt will be able to rid home of aid devices Baseline: using devices such as elevated toilet seat at eval Target date: 07/06/22 Goal status: INITIAL   PLAN: PT FREQUENCY: 1-2x/week   PT DURATION: other: 4 months   PLANNED INTERVENTIONS: Therapeutic exercises, Therapeutic activity, Neuromuscular re-education, Balance training, Gait training, Patient/Family education, Joint mobilization, Stair training, Aquatic Therapy, Electrical stimulation, Cryotherapy, Moist heat, Taping, and Manual therapy   PLAN FOR NEXT SESSION: assess response to aquatics- gross Le strength, weight acceptance

## 2022-03-09 ENCOUNTER — Encounter: Payer: Self-pay | Admitting: Family

## 2022-03-11 NOTE — Telephone Encounter (Signed)
Hey - see dtr msg please.Marland KitchenMarland KitchenMarland KitchenI just saw him as an acute late Friday- has had cough for 2 weeks, on max GERD meds, no COPD, so I gave Zpac RX propylactically - lab was closed - has hx of chronic sinus issues & looks like you

## 2022-03-12 ENCOUNTER — Other Ambulatory Visit: Payer: Self-pay | Admitting: Family

## 2022-03-12 MED ORDER — AMOXICILLIN-POT CLAVULANATE 875-125 MG PO TABS: 1.0000 | ORAL_TABLET | Freq: Two times a day (BID) | ORAL | 0 refills | Status: DC

## 2022-03-12 NOTE — Therapy (Signed)
?OUTPATIENT PHYSICAL THERAPY TREATMENT NOTE ? ? ?Patient Name: Bruce Ball ?MRN: [id number] ?DOB:1946-06-18, 76 y.o., male ?Today's Date: 03/13/2022 ? ?PCP: [name], MD ?REFERRING PROVIDER: [name], MD ? ? PT End of Session - 03/13/22 1517   ? ? Visit Number 3   ? Number of Visits 33   ? Date for PT Re-Evaluation 07/06/22   ? Authorization Type MCR   ? PT Start Time 1508   ? PT Stop Time 1546   ? PT Time Calculation (min) 38 min   ? Activity Tolerance Patient tolerated treatment well   ? Behavior During Therapy [facility] for tasks assessed/performed   ? ?  ?  ? ?  ? ? ? ?Past Medical History:  ?Diagnosis Date  ? Ankylosing spondylitis ([place])   ? Arthritis of right knee   ? Basal cell carcinoma   ? Chronic back pain greater than 3 months duration   ? Chronic leg pain   ? CKD (chronic kidney disease), stage II   ? Coronary artery calcification seen on CAT scan 03/27/2021  ? Deafness in right ear   ? Dyspnea   ? cause by PE  ? Essential tremor   ? Extrinsic asthma, unspecified 04/07/2009  ? PT DENIES  ? GERD (gastroesophageal reflux disease)   ? Hearing loss   ? Hip problem   ? History of kidney stones   ? HYPERTENSION 10/29/2007  ? HYPOTENSION 08/13/2010  ? LOW BACK PAIN 04/21/2008  ? Mild aortic stenosis   ? NEPHROLITHIASIS, HX OF 04/21/2008  ? Neuropathy   ? Osteopenia   ? Other hyperlipidemia   ? Peptic ulcer   ? Pneumonia   ? PUD, HX OF 04/21/2008  ? Pulmonary emboli ([place]) 2021  ? noted on CT 12-2019  ? Renal disorder   ? Stage 3 chronic kidney disease ([place])   ? Thyroid nodule   ? ?Past Surgical History:  ?Procedure Laterality Date  ? BACK SURGERY    ? bone cancer  1970  ? rt femur removed and steel rod placed  ? CYSTOSCOPY WITH RETROGRADE PYELOGRAM, URETEROSCOPY AND STENT PLACEMENT Right 11/06/2017  ? Procedure: CYSTOSCOPY WITH  STENT PLACEMENT RIGHT;  Surgeon: Raynelle Bring, MD;  [facility]: [facility];  Service: Urology;  Laterality: Right;  ? CYSTOSCOPY WITH RETROGRADE PYELOGRAM, URETEROSCOPY AND STENT  PLACEMENT Left 06/09/2019  ? Procedure: CYSTOSCOPY WITH RETROGRADE PYELOGRAM, URETEROSCOPY AND STENT PLACEMENT X2;  Surgeon: [name], MD;  [facility];  Service: Urology;  Laterality: Left;  ? CYSTOSCOPY WITH RETROGRADE PYELOGRAM, URETEROSCOPY AND STENT PLACEMENT Bilateral 03/01/2020  ? Procedure: CYSTOSCOPY WITH RETROGRADE PYELOGRAM, URETEROSCOPY AND STENT PLACEMENT;  Surgeon: [name], MD;  [facility];  Service: Urology;  Laterality: Bilateral;  75 MINS  ? CYSTOSCOPY/RETROGRADE/URETEROSCOPY    ? 1 year ago at [place] in [place]  ? CYSTOSCOPY/URETEROSCOPY/HOLMIUM LASER Right 11/24/2017  ? Procedure: CYSTOSCOPY/URETEROSCOPY/ RETROGRADE/STENT REMOVAL;  Surgeon: Raynelle Bring, MD;  [facility];  Service: Urology;  Laterality: Right;  ? HOLMIUM LASER APPLICATION Bilateral 03/04/5731  ? Procedure: HOLMIUM LASER APPLICATION;  Surgeon: [name], MD;  [facility];  Service: Urology;  Laterality: Bilateral;  ? prosatectomy    ? ?Patient Active Problem List  ? Diagnosis Date Noted  ? History of pulmonary embolism 10/10/2021  ? HTN (hypertension) 10/10/2021  ? Chronic diastolic CHF (congestive heart failure) ([place]) 10/10/2021  ? GERD (gastroesophageal reflux disease) 10/10/2021  ? Ankylosing spondylitis ([place]) 10/10/2021  ? CKD (chronic kidney disease), stage  III ([place]) 10/10/2021  ? Hyperlipidemia 10/10/2021  ? CAD (coronary artery disease) 10/10/2021  ? Periprosthetic fracture of shaft of femur 10/10/2021  ? Other fatigue 06/05/2021  ? CAD (coronary artery disease) 04/03/2021  ? Diastolic CHF ([place]) 89/37/3428  ? SOB (shortness of breath) on exertion 03/26/2021  ? Chronic anticoagulation 03/26/2021  ? Anemia in chronic kidney disease 01/25/2021  ? Acquired coagulation disorder ([place]) 12/12/2020  ? Senile purpura ([place]) 12/12/2020  ? S/P lumbar fusion 08/03/2020  ? Thin skin 07/25/2020  ? Aortic atherosclerosis ([place]) 05/17/2020  ? B12 deficiency 05/17/2020  ? Iliac artery aneurysm ([place])  05/17/2020  ? Osteoporosis 04/04/2020  ? Idiopathic neuropathy 01/28/2020  ? Recurrent falls 01/28/2020  ? Chronic kidney disease (CKD), stage III (moderate) ([place]) 01/19/2020  ? Pulmonary emboli ([place]) 01/14/2020  ? GERD (gastroesophageal reflux disease) 12/22/2019  ? Essential tremor 12/22/2019  ? Femoral condyle fracture ([place]) 07/15/2019  ? Gross hematuria 06/14/2019  ? Hyperlipidemia 11/11/2017  ? Kidney stone 11/05/2017  ? Community acquired pneumonia of right upper lobe of lung 11/05/2017  ? Right ureteral stone 11/05/2017  ? History of total right hip replacement 03/08/2017  ? History of prostate cancer 03/08/2017  ? Personal history of prostate cancer 02/28/2017  ? High risk medication use 02/28/2017  ? DDD (degenerative disc disease), thoracic 02/28/2017  ? Ataxia 05/18/2016  ? Vestibular disequilibrium 05/18/2016  ? Ankylosing spondylitis of cervical region [facility]) 05/24/2014  ? Lung nodule, solitary 05/24/2014  ? Lumbosacral stenosis 06/05/2013  ? Peripheral edema 06/08/2012  ? Subcortical microvascular ischemic occlusive disease 08/13/2010  ? Extrinsic asthma 04/07/2009  ? Backache 04/21/2008  ? History of peptic ulcer disease 04/21/2008  ? Essential hypertension 10/29/2007  ?REFERRING PROVIDER: [name], MD ?  ?REFERRING DIAG:  ?M54.50,G89.29 (ICD-10-CM) - Chronic bilateral low back pain without sciatica  ?R26.89 (ICD-10-CM) - Other abnormalities of gait and mobility  ?  ?  ?THERAPY DIAG:  ?Pain in right hip ?  ?Pain in right leg ?  ?Difficulty in walking, not elsewhere classified ?  ?Muscle weakness (generalized) ?  ?ONSET DATE: October 10, 2021 recent fall causing fracture ?  ?SUBJECTIVE:                                                                                                                                                                                          ?  ?SUBJECTIVE STATEMENT: ?Pt reports he walked around house with cane, and did exercises in his kitchen.  He went out to eat  and it irritated his Rt hip.  He was unable to get into pool due to temperature of pool. Plans to walk  in neighborhood with wife today (with rollator) ?  ?  ?PERTINENT HISTORY:  ?Stenosis, Rt THA 76 yr ago with h/o periprosthetic fx & revision in 2022, h/o back surgery  ?  ?PAIN:  ?Are you having pain? yes ?NPRS scale: 4/10 ?Pain location: Rt hip, groin, into knee ?  ?Aggravating factors: too much activity ?Relieving factors: rest ?  ?PRECAUTIONS: Posterior hip ?  ?WEIGHT BEARING RESTRICTIONS No ?  ?FALLS:  ?Has patient fallen in last 6 months? Yes, Number of falls: 1 ?  ?  ?PATIENT GOALS build up strength, get rid of assistance for toileting and getting in/out of bed, no AD in gait, stairs, Sons wedding in [place] first of June ?  ?  ?OBJECTIVE:  ?  ?PATIENT SURVEYS:  ?LEFS 39 ?  ?SCREENING FOR RED FLAGS: ?No significant findings ?  ?COGNITION: ?         Overall cognitive status: Within functional limits for tasks assessed              ?          ?SENSATION: ?         Bil hand/feet neuropathy ?  ?  ?  ?LUMBARAROM/PROM ?  ?A/PROM A/PROM  ?03/06/2022  ?Flexion    ?Extension    ?Right lateral flexion    ?Left lateral flexion    ?Right rotation    ?Left rotation    ? (Blank rows = not tested) ?  ?  ?LE MMT: ?  ?MMT Right ?03/06/2022  ?Hip flexion 4/5  ?Hip extension    ?Hip abduction    ?Hip adduction    ?Hip internal rotation    ?Hip external rotation    ?Knee flexion 3/5  ?Knee extension 3/5  ?Ankle dorsiflexion    ?Ankle plantarflexion    ?Ankle inversion    ?Ankle eversion    ? (Blank rows = not tested) ?  ?  ?  ?FUNCTIONAL TESTS:  ?5 times sit to stand: 38s ?Berg Balance Scale: 33/56 ?  ?GAIT: ?  ?Comments: SPC in Lt hand, rt leg abducted and externally rotated, antalgic on Rt with pressure placed through cane ?  ?  ?  ?TODAY'S TREATMENT  ?3/15: ?NuStep L4-5 x 7.5 min ?Side stepping at counter L/R x 5 laps ?Squats x 12, cues for hip hinge, at counter ?Heel raises x 25 ?Runners stretch x 20 sec, cues for form/  technique ?High knee marching holding onto counter x 10  ?Repeated with foot on ledge of bottom shelf of cabinet x 20 sec each ?Sit to/from stand x 10 cues for less weight through UE; 6 reps with RUE. ? ?3/10: ?Pt seen f

## 2022-03-13 ENCOUNTER — Ambulatory Visit (HOSPITAL_BASED_OUTPATIENT_CLINIC_OR_DEPARTMENT_OTHER): Payer: Medicare Other | Admitting: Physical Therapy

## 2022-03-13 ENCOUNTER — Encounter (HOSPITAL_BASED_OUTPATIENT_CLINIC_OR_DEPARTMENT_OTHER): Payer: Self-pay | Admitting: Physical Therapy

## 2022-03-13 ENCOUNTER — Other Ambulatory Visit: Payer: Self-pay

## 2022-03-13 DIAGNOSIS — L570 Actinic keratosis: Secondary | ICD-10-CM | POA: Diagnosis not present

## 2022-03-13 DIAGNOSIS — M545 Low back pain, unspecified: Secondary | ICD-10-CM | POA: Diagnosis not present

## 2022-03-13 DIAGNOSIS — C44319 Basal cell carcinoma of skin of other parts of face: Secondary | ICD-10-CM | POA: Diagnosis not present

## 2022-03-13 DIAGNOSIS — R262 Difficulty in walking, not elsewhere classified: Secondary | ICD-10-CM | POA: Diagnosis not present

## 2022-03-13 DIAGNOSIS — M6281 Muscle weakness (generalized): Secondary | ICD-10-CM

## 2022-03-13 DIAGNOSIS — D225 Melanocytic nevi of trunk: Secondary | ICD-10-CM | POA: Diagnosis not present

## 2022-03-13 DIAGNOSIS — M25551 Pain in right hip: Secondary | ICD-10-CM

## 2022-03-13 DIAGNOSIS — M79604 Pain in right leg: Secondary | ICD-10-CM

## 2022-03-13 DIAGNOSIS — D1801 Hemangioma of skin and subcutaneous tissue: Secondary | ICD-10-CM | POA: Diagnosis not present

## 2022-03-13 DIAGNOSIS — Z85828 Personal history of other malignant neoplasm of skin: Secondary | ICD-10-CM | POA: Diagnosis not present

## 2022-03-13 DIAGNOSIS — L814 Other melanin hyperpigmentation: Secondary | ICD-10-CM | POA: Diagnosis not present

## 2022-03-13 DIAGNOSIS — G8929 Other chronic pain: Secondary | ICD-10-CM | POA: Diagnosis not present

## 2022-03-13 DIAGNOSIS — D0472 Carcinoma in situ of skin of left lower limb, including hip: Secondary | ICD-10-CM | POA: Diagnosis not present

## 2022-03-14 ENCOUNTER — Telehealth: Payer: Self-pay | Admitting: Pharmacist

## 2022-03-14 NOTE — Progress Notes (Signed)
? ? ?Chronic Care Management ?Pharmacy Assistant  ? ?Name: Bruce Ball  MRN: [id number] DOB: 03-21-1946 ? ? ?Reason for Encounter: General Adherence Call ?  ? ?Recent office visits:  ?03/07/2022 OV (Family Medicine) Jeanie Sewer, NP; Rx for Azirthromax 250 mg for cough ?-changed Azithromax to Augmentin on 03/12/22 ? ?Recent consult visits:  ?01/22/2022 OV (Oncology) Lockie Mola, MD; no medication changes indicated. ? ?01/17/2022 VV (Cardiology) Sueanne Margarita, MD; no medication changes indicated. ? ?Hospital visits:  ?None in previous 6 months ? ?Medications: ?Outpatient Encounter Medications as of 03/14/2022  ?Medication Sig  ? acetaminophen (TYLENOL) 325 MG tablet Take 2 tablets (650 mg total) by mouth every 6 (six) hours as needed for mild pain or moderate pain.  ? amoxicillin-clavulanate (AUGMENTIN) 875-125 MG tablet Take 1 tablet by mouth 2 (two) times daily after a meal.  ? apixaban (ELIQUIS) 2.5 MG TABS tablet Take 1 tablet (2.5 mg total) by mouth 2 (two) times daily.  ? atorvastatin (LIPITOR) 20 MG tablet Take 1 tablet (20 mg total) by mouth at bedtime.  ? Calcium Carb-Cholecalciferol 600-10 MG-MCG TABS Take 1 tablet by mouth 2 (two) times daily.  ? carvedilol (COREG) 25 MG tablet Take 12.5 mg by mouth 2 (two) times daily.  ? Cholecalciferol (VITAMIN D3) 50 MCG (2000 UT) capsule Take 2,000 Units by mouth in the morning and at bedtime.   ? DULoxetine (CYMBALTA) 30 MG capsule Take 30 mg by mouth daily.  ? ferrous sulfate 325 (65 FE) MG tablet Take 325 mg by mouth every Monday, Wednesday, and Friday.  ? fluticasone (FLONASE) 50 MCG/ACT nasal spray Place 1-2 sprays into both nostrils daily as needed for allergies or rhinitis.  ? gabapentin (NEURONTIN) 100 MG capsule Take 100 mg by mouth every morning.  ? gabapentin (NEURONTIN) 300 MG capsule Take 300 mg by mouth at bedtime.  ? ibuprofen (ADVIL) 200 MG tablet Take 600 mg by mouth every 6 (six) hours as needed for fever, headache or mild pain.  (Patient not taking: Reported on 03/07/2022)  ? indapamide (LOZOL) 1.25 MG tablet Take 1.25 mg by mouth daily. (Patient not taking: Reported on 03/07/2022)  ? methocarbamol (ROBAXIN) 500 MG tablet Take 1 tablet by mouth at bedtime as needed for muscle spasms.  ? metoCLOPramide (REGLAN) 5 MG tablet TAKE 1 TABLET (5 MG TOTAL) BY MOUTH 3 (THREE) TIMES DAILY BEFORE MEALS.  ? Multiple Vitamins-Minerals (MULTIVITAMIN WITH MINERALS) tablet Take 1 tablet by mouth daily. (Patient not taking: Reported on 03/07/2022)  ? pantoprazole (PROTONIX) 40 MG tablet Take 40 mg by mouth 2 (two) times daily before a meal.  ? traMADol (ULTRAM) 50 MG tablet Take 50 mg by mouth as needed.  ? ?No facility-administered encounter medications on file as of 03/14/2022.  ? ?Patient Questions: ?Have you had any problems recently with your health? ?Patient states he hasn't had any new recent problems with his health. ? ?Have you had any problems with your pharmacy? ?Patient states he hasn't had any issues with his pharmacy. ? ?What issues or side effects are you having with your medications? ?Patient denies having any issues or side effects with any of his medications. ? ?What would you like me to pass along to Leata Mouse, CPP for him to help you with?  ?Patient states he doesn't have anything for me to pass along at this time. ? ?Patient scheduled a follow up appointment with clinical pharmacist 04/30/2022 at 2:00 pm. ? ?Care Gaps: ?Medicare Annual Wellness: Completed 01/25/2022 ?Hemoglobin A1C:  5.5% on 06/05/2021 ?Colonoscopy: Completed 09/26/2021 ? ?Future Appointments  ?Date Time Provider [place]  ?03/15/2022 11:00 AM Carlson-Long, Anderson Malta L [facility]  ?03/20/2022  3:00 PM Carlson-Long, Stephani Police [facility]  ?03/28/2022  3:15 PM Kerman Passey, Janett Billow, PT [facility]  ?03/29/2022 10:40 AM GI-315 CT 1 GI-315CT GI-[address]  ?04/03/2022 11:20 AM [name], MD [facility]  ?04/03/2022  3:00 PM Vedia Pereyra, PT [facility]  ?04/05/2022  1:45 PM  Kerman Passey, Janett Billow, PT [facility]  ?04/08/2022  2:30 PM Kerman Passey, Janett Billow, PT [facility]  ?04/10/2022 11:45 AM Carlson-Long, Stephani Police [facility]  ?04/17/2022 11:45 AM Carlson-Long, Stephani Police [facility]  ?04/19/2022 11:00 AM Selinda Eon, PT [facility]  ?04/24/2022 11:45 AM Carlson-Long, Stephani Police [facility]  ?04/26/2022 11:00 AM Selinda Eon, PT [facility]  ?05/01/2022 11:45 AM Carlson-Long, Stephani Police [facility]  ?05/03/2022 11:45 AM Carlson-Long, Stephani Police [facility]  ?05/08/2022 11:45 AM Carlson-Long, Stephani Police [facility]  ?05/10/2022 11:00 AM Selinda Eon, PT [facility]  ?05/15/2022 11:45 AM Carlson-Long, Stephani Police [facility]  ?05/17/2022 11:00 AM Selinda Eon, PT [facility]  ?05/22/2022 11:45 AM Carlson-Long, Stephani Police [facility]  ?05/24/2022 11:00 AM Selinda Eon, PT [facility]  ?05/29/2022 11:45 AM Carlson-Long, Stephani Police [facility]  ?05/31/2022 11:00 AM Selinda Eon, PT [facility]  ?06/07/2022 11:00 AM Selinda Eon, PT [facility]  ?06/12/2022 11:45 AM Carlson-Long, Stephani Police [facility]  ?06/14/2022 11:00 AM Selinda Eon, PT [facility]  ?06/19/2022 11:45 AM Carlson-Long, Stephani Police [facility]  ?06/21/2022 11:00 AM Selinda Eon, PT [facility]  ?06/26/2022 11:45 AM Carlson-Long, Stephani Police [facility]  ?06/28/2022 11:00 AM Selinda Eon, PT [facility]  ?07/03/2022 11:45 AM Carlson-Long, Stephani Police [facility]  ?07/05/2022 11:00 AM Selinda Eon, PT [facility]  ?07/10/2022 11:45 AM Carlson-Long, Stephani Police [facility]  ?07/12/2022 11:00 AM Selinda Eon, PT [facility]  ?07/17/2022 11:45 AM Carlson-Long, Stephani Police [facility]  ?07/19/2022 11:00 AM Selinda Eon, PT [facility]  ?07/24/2022  1:30 PM Vedia Pereyra, PT [facility]  ?07/26/2022 11:00 AM Selinda Eon, PT [facility]  ?01/31/2023 11:45 AM [facility]  ? ? ?Star Rating Drugs: ?Atorvastatin - no fill date available ? ?April D Calhoun, [place] ?Clinical  Pharmacist Assistant ?[phone]  ?

## 2022-03-15 ENCOUNTER — Ambulatory Visit (HOSPITAL_BASED_OUTPATIENT_CLINIC_OR_DEPARTMENT_OTHER): Payer: Medicare Other | Admitting: Physical Therapy

## 2022-03-15 ENCOUNTER — Encounter (HOSPITAL_BASED_OUTPATIENT_CLINIC_OR_DEPARTMENT_OTHER): Payer: Self-pay | Admitting: Physical Therapy

## 2022-03-15 ENCOUNTER — Other Ambulatory Visit: Payer: Self-pay

## 2022-03-15 DIAGNOSIS — M545 Low back pain, unspecified: Secondary | ICD-10-CM | POA: Diagnosis not present

## 2022-03-15 DIAGNOSIS — G8929 Other chronic pain: Secondary | ICD-10-CM | POA: Diagnosis not present

## 2022-03-15 DIAGNOSIS — M6281 Muscle weakness (generalized): Secondary | ICD-10-CM

## 2022-03-15 DIAGNOSIS — R262 Difficulty in walking, not elsewhere classified: Secondary | ICD-10-CM | POA: Diagnosis not present

## 2022-03-15 DIAGNOSIS — M25551 Pain in right hip: Secondary | ICD-10-CM

## 2022-03-15 DIAGNOSIS — M79604 Pain in right leg: Secondary | ICD-10-CM

## 2022-03-15 NOTE — Therapy (Signed)
?OUTPATIENT PHYSICAL THERAPY TREATMENT NOTE ? ? ?Patient Name: Bruce Ball ?MRN: [id number] ?DOB:[birth year], 76 y.o., male ?Today's Date: 03/15/2022 ? ?PCP: [name], MD ?REFERRING PROVIDER: [name], MD ? ? PT End of Session - 03/15/22 1104   ? ? Visit Number 4   ? Number of Visits 33   ? Date for PT Re-Evaluation 07/06/22   ? Authorization Type MCR   ? PT Start Time 1100   ? PT Stop Time 1140   ? PT Time Calculation (min) 40 min   ? Activity Tolerance Patient tolerated treatment well   ? Behavior During Therapy [facility] for tasks assessed/performed   ? ?  ?  ? ?  ? ? ? ?Past Medical History:  ?Diagnosis Date  ? Ankylosing spondylitis ([place])   ? Arthritis of right knee   ? Basal cell carcinoma   ? Chronic back pain greater than 3 months duration   ? Chronic leg pain   ? CKD (chronic kidney disease), stage II   ? Coronary artery calcification seen on CAT scan 03/27/2021  ? Deafness in right ear   ? Dyspnea   ? cause by PE  ? Essential tremor   ? Extrinsic asthma, unspecified 04/07/2009  ? PT DENIES  ? GERD (gastroesophageal reflux disease)   ? Hearing loss   ? Hip problem   ? History of kidney stones   ? HYPERTENSION 10/29/2007  ? HYPOTENSION 08/13/2010  ? LOW BACK PAIN 04/21/2008  ? Mild aortic stenosis   ? NEPHROLITHIASIS, HX OF 04/21/2008  ? Neuropathy   ? Osteopenia   ? Other hyperlipidemia   ? Peptic ulcer   ? Pneumonia   ? PUD, HX OF 04/21/2008  ? Pulmonary emboli ([place]) 2021  ? noted on CT 12-2019  ? Renal disorder   ? Stage 3 chronic kidney disease ([place])   ? Thyroid nodule   ? ?Past Surgical History:  ?Procedure Laterality Date  ? BACK SURGERY    ? bone cancer  1970  ? rt femur removed and steel rod placed  ? CYSTOSCOPY WITH RETROGRADE PYELOGRAM, URETEROSCOPY AND STENT PLACEMENT Right 11/06/2017  ? Procedure: CYSTOSCOPY WITH  STENT PLACEMENT RIGHT;  Surgeon: Raynelle Bring, MD;  [facility];  Service: Urology;  Laterality: Right;  ? CYSTOSCOPY WITH RETROGRADE PYELOGRAM, URETEROSCOPY AND STENT  PLACEMENT Left 06/09/2019  ? Procedure: CYSTOSCOPY WITH RETROGRADE PYELOGRAM, URETEROSCOPY AND STENT PLACEMENT X2;  Surgeon: [name], MD;  [facility];  Service: Urology;  Laterality: Left;  ? CYSTOSCOPY WITH RETROGRADE PYELOGRAM, URETEROSCOPY AND STENT PLACEMENT Bilateral 03/01/2020  ? Procedure: CYSTOSCOPY WITH RETROGRADE PYELOGRAM, URETEROSCOPY AND STENT PLACEMENT;  Surgeon: [name], MD;  [facility];  Service: Urology;  Laterality: Bilateral;  75 MINS  ? CYSTOSCOPY/RETROGRADE/URETEROSCOPY    ? 1 year ago at [place] in [place]  ? CYSTOSCOPY/URETEROSCOPY/HOLMIUM LASER Right 11/24/2017  ? Procedure: CYSTOSCOPY/URETEROSCOPY/ RETROGRADE/STENT REMOVAL;  Surgeon: Raynelle Bring, MD;  [facility];  Service: Urology;  Laterality: Right;  ? HOLMIUM LASER APPLICATION Bilateral 02/07/7680  ? Procedure: HOLMIUM LASER APPLICATION;  Surgeon: [name], MD;  [facility];  Service: Urology;  Laterality: Bilateral;  ? prosatectomy    ? ?Patient Active Problem List  ? Diagnosis Date Noted  ? History of pulmonary embolism 10/10/2021  ? HTN (hypertension) 10/10/2021  ? Chronic diastolic CHF (congestive heart failure) ([place]) 10/10/2021  ? GERD (gastroesophageal reflux disease) 10/10/2021  ? Ankylosing spondylitis ([place]) 10/10/2021  ? CKD (chronic kidney disease), stage  III ([place]) 10/10/2021  ? Hyperlipidemia 10/10/2021  ? CAD (coronary artery disease) 10/10/2021  ? Periprosthetic fracture of shaft of femur 10/10/2021  ? Other fatigue 06/05/2021  ? CAD (coronary artery disease) 04/03/2021  ? Diastolic CHF ([place]) 83/38/2505  ? SOB (shortness of breath) on exertion 03/26/2021  ? Chronic anticoagulation 03/26/2021  ? Anemia in chronic kidney disease 01/25/2021  ? Acquired coagulation disorder ([place]) 12/12/2020  ? Senile purpura ([place]) 12/12/2020  ? S/P lumbar fusion 08/03/2020  ? Thin skin 07/25/2020  ? Aortic atherosclerosis ([place]) 05/17/2020  ? B12 deficiency 05/17/2020  ? Iliac artery aneurysm ([place])  05/17/2020  ? Osteoporosis 04/04/2020  ? Idiopathic neuropathy 01/28/2020  ? Recurrent falls 01/28/2020  ? Chronic kidney disease (CKD), stage III (moderate) ([place]) 01/19/2020  ? Pulmonary emboli ([place]) 01/14/2020  ? GERD (gastroesophageal reflux disease) 12/22/2019  ? Essential tremor 12/22/2019  ? Femoral condyle fracture ([place]) 07/15/2019  ? Gross hematuria 06/14/2019  ? Hyperlipidemia 11/11/2017  ? Kidney stone 11/05/2017  ? Community acquired pneumonia of right upper lobe of lung 11/05/2017  ? Right ureteral stone 11/05/2017  ? History of total right hip replacement 03/08/2017  ? History of prostate cancer 03/08/2017  ? Personal history of prostate cancer 02/28/2017  ? High risk medication use 02/28/2017  ? DDD (degenerative disc disease), thoracic 02/28/2017  ? Ataxia 05/18/2016  ? Vestibular disequilibrium 05/18/2016  ? Ankylosing spondylitis of cervical region [facility]) 05/24/2014  ? Lung nodule, solitary 05/24/2014  ? Lumbosacral stenosis 06/05/2013  ? Peripheral edema 06/08/2012  ? Subcortical microvascular ischemic occlusive disease 08/13/2010  ? Extrinsic asthma 04/07/2009  ? Backache 04/21/2008  ? History of peptic ulcer disease 04/21/2008  ? Essential hypertension 10/29/2007  ?REFERRING PROVIDER: [name], MD ?  ?REFERRING DIAG:  ?M54.50,G89.29 (ICD-10-CM) - Chronic bilateral low back pain without sciatica  ?R26.89 (ICD-10-CM) - Other abnormalities of gait and mobility  ?  ?  ?THERAPY DIAG:  ?Pain in right hip ?  ?Pain in right leg ?  ?Difficulty in walking, not elsewhere classified ?  ?Muscle weakness (generalized) ?  ?ONSET DATE: October 10, 2021 recent fall causing fracture ?  ?SUBJECTIVE:                                                                                                                                                                                          ?  ?SUBJECTIVE STATEMENT: ?Pt reports that he walked 0.3 miles with Home Health 2 days ago. Yesterday was too cold, so he walked  around kitchen and did exercises (HEP). RLE hurts worse today than last visit.  He was cleared by Dermatologist to go in  water (had moles removed on body, specifically the Lt thigh) ?  ?  ?PERTINENT HISTORY:  ?Stenosis, Rt THA 74 yr ago with h/o periprosthetic fx & revision in 2022, h/o back surgery  ?Hard of hearing; can hear out of Left ear ?  ?PAIN:  ?Are you having pain? yes ?NPRS scale: 5/10 ?Pain location: Rt hip, groin, into knee ?  ?Aggravating factors: too much activity ?Relieving factors: rest ?  ?PRECAUTIONS: Posterior hip ?  ?WEIGHT BEARING RESTRICTIONS No ?  ?FALLS:  ?Has patient fallen in last 6 months? Yes, Number of falls: 1 ?  ?  ?PATIENT GOALS build up strength, get rid of assistance for toileting and getting in/out of bed, no AD in gait, stairs, Sons wedding in [place] first of June ?  ?  ?OBJECTIVE: *All objective findings taken at [facility] unless otherwise noted.  ?  ?PATIENT SURVEYS:  ?LEFS 39 ?  ?SCREENING FOR RED FLAGS: ?No significant findings ?  ?COGNITION: ?         Overall cognitive status: Within functional limits for tasks assessed              ?          ?SENSATION: ?         Bil hand/feet neuropathy ?  ?  ?  ?LUMBARAROM/PROM ?  ?A/PROM A/PROM  ?03/06/2022  ?Flexion    ?Extension    ?Right lateral flexion    ?Left lateral flexion    ?Right rotation    ?Left rotation    ? (Blank rows = not tested) ?  ?  ?LE MMT: ?  ?MMT Right ?03/06/2022  ?Hip flexion 4/5  ?Hip extension    ?Hip abduction    ?Hip adduction    ?Hip internal rotation    ?Hip external rotation    ?Knee flexion 3/5  ?Knee extension 3/5  ?Ankle dorsiflexion    ?Ankle plantarflexion    ?Ankle inversion    ?Ankle eversion    ? (Blank rows = not tested) ?  ?  ?  ?FUNCTIONAL TESTS:  ?5 times sit to stand: 38s ?Berg Balance Scale: 33/56 ?  ?GAIT: ?  ?Comments: SPC in Lt hand, rt leg abducted and externally rotated, antalgic on Rt with pressure placed through cane ?  ?  ?  ?TODAY'S TREATMENT  ?Pt seen for aquatic therapy today.  Treatment  took place in water 3.25-4 ft in depth at the [employer] pool. Temp of water was 91?.  Pt entered/exited the pool via stairs with supervision with bilat rail. ? ?Walking forward, backward, and side

## 2022-03-20 ENCOUNTER — Other Ambulatory Visit: Payer: Self-pay

## 2022-03-20 ENCOUNTER — Encounter (HOSPITAL_BASED_OUTPATIENT_CLINIC_OR_DEPARTMENT_OTHER): Payer: Self-pay | Admitting: Physical Therapy

## 2022-03-20 ENCOUNTER — Ambulatory Visit (HOSPITAL_BASED_OUTPATIENT_CLINIC_OR_DEPARTMENT_OTHER): Payer: Medicare Other | Admitting: Physical Therapy

## 2022-03-20 DIAGNOSIS — M25551 Pain in right hip: Secondary | ICD-10-CM | POA: Diagnosis not present

## 2022-03-20 DIAGNOSIS — R262 Difficulty in walking, not elsewhere classified: Secondary | ICD-10-CM

## 2022-03-20 DIAGNOSIS — M79604 Pain in right leg: Secondary | ICD-10-CM | POA: Diagnosis not present

## 2022-03-20 DIAGNOSIS — M6281 Muscle weakness (generalized): Secondary | ICD-10-CM

## 2022-03-20 DIAGNOSIS — G8929 Other chronic pain: Secondary | ICD-10-CM | POA: Diagnosis not present

## 2022-03-20 DIAGNOSIS — M545 Low back pain, unspecified: Secondary | ICD-10-CM | POA: Diagnosis not present

## 2022-03-20 NOTE — Therapy (Signed)
?OUTPATIENT PHYSICAL THERAPY TREATMENT NOTE ? ? ?Patient Name: Bruce Ball ?MRN: [id number] ?DOB:Apr 29, 1946, 76 y.o., male ?Today's Date: 03/20/2022 ? ?PCP: [name], MD ?REFERRING PROVIDER: [name], MD ? ? PT End of Session - 03/20/22 1510   ? ? Visit Number 5   ? Number of Visits 33   ? Date for PT Re-Evaluation 07/06/22   ? Authorization Type MCR   ? PT Start Time 1503   ? PT Stop Time 3664   ? PT Time Calculation (min) 41 min   ? Activity Tolerance Patient tolerated treatment well   ? Behavior During Therapy [facility] for tasks assessed/performed   ? ?  ?  ? ?  ? ? ? ?Past Medical History:  ?Diagnosis Date  ? Ankylosing spondylitis ([place])   ? Arthritis of right knee   ? Basal cell carcinoma   ? Chronic back pain greater than 3 months duration   ? Chronic leg pain   ? CKD (chronic kidney disease), stage II   ? Coronary artery calcification seen on CAT scan 03/27/2021  ? Deafness in right ear   ? Dyspnea   ? cause by PE  ? Essential tremor   ? Extrinsic asthma, unspecified 04/07/2009  ? PT DENIES  ? GERD (gastroesophageal reflux disease)   ? Hearing loss   ? Hip problem   ? History of kidney stones   ? HYPERTENSION 10/29/2007  ? HYPOTENSION 08/13/2010  ? LOW BACK PAIN 04/21/2008  ? Mild aortic stenosis   ? NEPHROLITHIASIS, HX OF 04/21/2008  ? Neuropathy   ? Osteopenia   ? Other hyperlipidemia   ? Peptic ulcer   ? Pneumonia   ? PUD, HX OF 04/21/2008  ? Pulmonary emboli ([place]) 2021  ? noted on CT 12-2019  ? Renal disorder   ? Stage 3 chronic kidney disease ([place])   ? Thyroid nodule   ? ?Past Surgical History:  ?Procedure Laterality Date  ? BACK SURGERY    ? bone cancer  1970  ? rt femur removed and steel rod placed  ? CYSTOSCOPY WITH RETROGRADE PYELOGRAM, URETEROSCOPY AND STENT PLACEMENT Right 11/06/2017  ? Procedure: CYSTOSCOPY WITH  STENT PLACEMENT RIGHT;  Surgeon: Raynelle Bring, MD;  Location: [facility];  Service: Urology;  Laterality: Right;  ? CYSTOSCOPY WITH RETROGRADE PYELOGRAM, URETEROSCOPY AND STENT  PLACEMENT Left 06/09/2019  ? Procedure: CYSTOSCOPY WITH RETROGRADE PYELOGRAM, URETEROSCOPY AND STENT PLACEMENT X2;  Surgeon: [name], MD;  [facility];  Service: Urology;  Laterality: Left;  ? CYSTOSCOPY WITH RETROGRADE PYELOGRAM, URETEROSCOPY AND STENT PLACEMENT Bilateral 03/01/2020  ? Procedure: CYSTOSCOPY WITH RETROGRADE PYELOGRAM, URETEROSCOPY AND STENT PLACEMENT;  Surgeon: [name], MD;  Location: [facility];  Service: Urology;  Laterality: Bilateral;  75 MINS  ? CYSTOSCOPY/RETROGRADE/URETEROSCOPY    ? 1 year ago at [place] in [place]  ? CYSTOSCOPY/URETEROSCOPY/HOLMIUM LASER Right 11/24/2017  ? Procedure: CYSTOSCOPY/URETEROSCOPY/ RETROGRADE/STENT REMOVAL;  Surgeon: Raynelle Bring, MD;  [facility];  Service: Urology;  Laterality: Right;  ? HOLMIUM LASER APPLICATION Bilateral 4/0/3474  ? Procedure: HOLMIUM LASER APPLICATION;  Surgeon: [name], MD;  Location: [facility];  Service: Urology;  Laterality: Bilateral;  ? prosatectomy    ? ?Patient Active Problem List  ? Diagnosis Date Noted  ? History of pulmonary embolism 10/10/2021  ? HTN (hypertension) 10/10/2021  ? Chronic diastolic CHF (congestive heart failure) ([place]) 10/10/2021  ? GERD (gastroesophageal reflux disease) 10/10/2021  ? Ankylosing spondylitis ([place]) 10/10/2021  ? CKD (chronic kidney disease), stage  III ([place]) 10/10/2021  ? Hyperlipidemia 10/10/2021  ? CAD (coronary artery disease) 10/10/2021  ? Periprosthetic fracture of shaft of femur 10/10/2021  ? Other fatigue 06/05/2021  ? CAD (coronary artery disease) 04/03/2021  ? Diastolic CHF ([place]) 40/34/7425  ? SOB (shortness of breath) on exertion 03/26/2021  ? Chronic anticoagulation 03/26/2021  ? Anemia in chronic kidney disease 01/25/2021  ? Acquired coagulation disorder ([place]) 12/12/2020  ? Senile purpura ([place]) 12/12/2020  ? S/P lumbar fusion 08/03/2020  ? Thin skin 07/25/2020  ? Aortic atherosclerosis ([place]) 05/17/2020  ? B12 deficiency 05/17/2020  ? Iliac artery aneurysm ([place])  05/17/2020  ? Osteoporosis 04/04/2020  ? Idiopathic neuropathy 01/28/2020  ? Recurrent falls 01/28/2020  ? Chronic kidney disease (CKD), stage III (moderate) ([place]) 01/19/2020  ? Pulmonary emboli ([place]) 01/14/2020  ? GERD (gastroesophageal reflux disease) 12/22/2019  ? Essential tremor 12/22/2019  ? Femoral condyle fracture ([place]) 07/15/2019  ? Gross hematuria 06/14/2019  ? Hyperlipidemia 11/11/2017  ? Kidney stone 11/05/2017  ? Community acquired pneumonia of right upper lobe of lung 11/05/2017  ? Right ureteral stone 11/05/2017  ? History of total right hip replacement 03/08/2017  ? History of prostate cancer 03/08/2017  ? Personal history of prostate cancer 02/28/2017  ? High risk medication use 02/28/2017  ? DDD (degenerative disc disease), thoracic 02/28/2017  ? Ataxia 05/18/2016  ? Vestibular disequilibrium 05/18/2016  ? Ankylosing spondylitis of cervical region [facility]) 05/24/2014  ? Lung nodule, solitary 05/24/2014  ? Lumbosacral stenosis 06/05/2013  ? Peripheral edema 06/08/2012  ? Subcortical microvascular ischemic occlusive disease 08/13/2010  ? Extrinsic asthma 04/07/2009  ? Backache 04/21/2008  ? History of peptic ulcer disease 04/21/2008  ? Essential hypertension 10/29/2007  ?REFERRING PROVIDER: [name], MD ?  ?REFERRING DIAG:  ?M54.50,G89.29 (ICD-10-CM) - Chronic bilateral low back pain without sciatica  ?R26.89 (ICD-10-CM) - Other abnormalities of gait and mobility  ?  ?  ?THERAPY DIAG:  ?Pain in right hip ?  ?Pain in right leg ?  ?Difficulty in walking, not elsewhere classified ?  ?Muscle weakness (generalized) ?  ?ONSET DATE: October 10, 2021 recent fall causing fracture ?  ?SUBJECTIVE:                                                                                                                                                                                          ?  ?SUBJECTIVE STATEMENT: ?Pt reports that he walked 0.59 miles with Home Health yesterday.  He feels like he is going  backwards; feeling winded and having increased Rt knee pain.  His daughter (who is a Software engineer) is having him take Aleve for 10 days.   ?  ?  PERTINENT HISTORY:  ?Stenosis, Rt THA 79 yr ago with h/o periprosthetic fx & revision in 2022, h/o back surgery  ?Hard of hearing; can hear out of Left ear ?  ?PAIN:  ?Are you having pain? yes ?NPRS scale: 5/10 ?Pain location: Rt hip, groin, into knee ?  ?Aggravating factors: too much activity ?Relieving factors: rest ?  ?PRECAUTIONS: Posterior hip ?  ?WEIGHT BEARING RESTRICTIONS No ?  ?FALLS:  ?Has patient fallen in last 6 months? Yes, Number of falls: 1 ?  ?  ?PATIENT GOALS build up strength, get rid of assistance for toileting and getting in/out of bed, no AD in gait, stairs, Sons wedding in [place] first of June ?  ?  ?OBJECTIVE: *All objective findings taken at [facility] unless otherwise noted.  ?  ?PATIENT SURVEYS:  ?LEFS 39 ?  ?SCREENING FOR RED FLAGS: ?No significant findings ?  ?COGNITION: ?         Overall cognitive status: Within functional limits for tasks assessed              ?          ?SENSATION: ?         Bil hand/feet neuropathy ?  ?  ?  ?LUMBARAROM/PROM ?  ?A/PROM A/PROM  ?03/06/2022  ?Flexion    ?Extension    ?Right lateral flexion    ?Left lateral flexion    ?Right rotation    ?Left rotation    ? (Blank rows = not tested) ?  ?  ?LE MMT: ?  ?MMT Right ?03/06/2022  ?Hip flexion 4/5  ?Hip extension    ?Hip abduction    ?Hip adduction    ?Hip internal rotation    ?Hip external rotation    ?Knee flexion 3/5  ?Knee extension 3/5  ?Ankle dorsiflexion    ?Ankle plantarflexion    ?Ankle inversion    ?Ankle eversion    ? (Blank rows = not tested) ?  ?  ?  ?FUNCTIONAL TESTS:  ?5 times sit to stand: 38s ?Berg Balance Scale: 33/56 ?  ?GAIT: ?  ?Comments: SPC in Lt hand, rt leg abducted and externally rotated, antalgic on Rt with pressure placed through cane ?  ?  ? TODAY'S TREATMENT  ?3/22: ?NuStep L5 x 8.5 min (therapist present to monitor and discuss progress) ?Standing calf  stretch, holding counter x 20 sec x 2 reps ?Seated hamstring stretch, x 20 sec x 2 each leg ?Sit to/from stand x 6 reps, UE support for initial lift off and lowering down.  ?Semi-tandem with horiz/vertical head turns (un

## 2022-03-27 ENCOUNTER — Other Ambulatory Visit: Payer: Self-pay

## 2022-03-27 ENCOUNTER — Encounter: Payer: Self-pay | Admitting: Family Medicine

## 2022-03-27 DIAGNOSIS — N183 Chronic kidney disease, stage 3 unspecified: Secondary | ICD-10-CM

## 2022-03-28 ENCOUNTER — Ambulatory Visit (HOSPITAL_BASED_OUTPATIENT_CLINIC_OR_DEPARTMENT_OTHER): Payer: Medicare Other | Admitting: Physical Therapy

## 2022-03-28 ENCOUNTER — Encounter (HOSPITAL_BASED_OUTPATIENT_CLINIC_OR_DEPARTMENT_OTHER): Payer: Self-pay | Admitting: Physical Therapy

## 2022-03-28 DIAGNOSIS — Z85828 Personal history of other malignant neoplasm of skin: Secondary | ICD-10-CM | POA: Diagnosis not present

## 2022-03-28 DIAGNOSIS — D0472 Carcinoma in situ of skin of left lower limb, including hip: Secondary | ICD-10-CM | POA: Diagnosis not present

## 2022-03-28 DIAGNOSIS — M6281 Muscle weakness (generalized): Secondary | ICD-10-CM

## 2022-03-28 DIAGNOSIS — M545 Low back pain, unspecified: Secondary | ICD-10-CM | POA: Diagnosis not present

## 2022-03-28 DIAGNOSIS — R262 Difficulty in walking, not elsewhere classified: Secondary | ICD-10-CM | POA: Diagnosis not present

## 2022-03-28 DIAGNOSIS — M25551 Pain in right hip: Secondary | ICD-10-CM

## 2022-03-28 DIAGNOSIS — G8929 Other chronic pain: Secondary | ICD-10-CM | POA: Diagnosis not present

## 2022-03-28 DIAGNOSIS — M79604 Pain in right leg: Secondary | ICD-10-CM

## 2022-03-28 NOTE — Therapy (Signed)
?OUTPATIENT PHYSICAL THERAPY TREATMENT NOTE ? ? ?Patient Name: Bruce Ball ?MRN: [id number] ?DOB:09-24-1946, 76 y.o., male ?Today's Date: 03/28/2022 ? ?PCP: [name], MD ?REFERRING PROVIDER: [name], MD ? ? PT End of Session - 03/28/22 1515   ? ? Visit Number 6   ? Number of Visits 33   ? Date for PT Re-Evaluation 07/06/22   ? Authorization Type MCR   ? PT Start Time 1515   ? PT Stop Time 1556   ? PT Time Calculation (min) 41 min   ? Activity Tolerance Patient tolerated treatment well   ? Behavior During Therapy [facility] for tasks assessed/performed   ? ?  ?  ? ?  ? ? ? ?Past Medical History:  ?Diagnosis Date  ? Ankylosing spondylitis ([place])   ? Arthritis of right knee   ? Basal cell carcinoma   ? Chronic back pain greater than 3 months duration   ? Chronic leg pain   ? CKD (chronic kidney disease), stage II   ? Coronary artery calcification seen on CAT scan 03/27/2021  ? Deafness in right ear   ? Dyspnea   ? cause by PE  ? Essential tremor   ? Extrinsic asthma, unspecified 04/07/2009  ? PT DENIES  ? GERD (gastroesophageal reflux disease)   ? Hearing loss   ? Hip problem   ? History of kidney stones   ? HYPERTENSION 10/29/2007  ? HYPOTENSION 08/13/2010  ? LOW BACK PAIN 04/21/2008  ? Mild aortic stenosis   ? NEPHROLITHIASIS, HX OF 04/21/2008  ? Neuropathy   ? Osteopenia   ? Other hyperlipidemia   ? Peptic ulcer   ? Pneumonia   ? PUD, HX OF 04/21/2008  ? Pulmonary emboli ([place]) 2021  ? noted on CT 12-2019  ? Renal disorder   ? Stage 3 chronic kidney disease ([place])   ? Thyroid nodule   ? ?Past Surgical History:  ?Procedure Laterality Date  ? BACK SURGERY    ? bone cancer  1970  ? rt femur removed and steel rod placed  ? CYSTOSCOPY WITH RETROGRADE PYELOGRAM, URETEROSCOPY AND STENT PLACEMENT Right 11/06/2017  ? Procedure: CYSTOSCOPY WITH  STENT PLACEMENT RIGHT;  Surgeon: Raynelle Bring, MD;  [facility];  Service: Urology;  Laterality: Right;  ? CYSTOSCOPY WITH RETROGRADE PYELOGRAM, URETEROSCOPY AND STENT  PLACEMENT Left 06/09/2019  ? Procedure: CYSTOSCOPY WITH RETROGRADE PYELOGRAM, URETEROSCOPY AND STENT PLACEMENT X2;  Surgeon: [name], MD;  [facility];  Service: Urology;  Laterality: Left;  ? CYSTOSCOPY WITH RETROGRADE PYELOGRAM, URETEROSCOPY AND STENT PLACEMENT Bilateral 03/01/2020  ? Procedure: CYSTOSCOPY WITH RETROGRADE PYELOGRAM, URETEROSCOPY AND STENT PLACEMENT;  Surgeon: [name], MD;  [facility];  Service: Urology;  Laterality: Bilateral;  75 MINS  ? CYSTOSCOPY/RETROGRADE/URETEROSCOPY    ? 1 year ago at [place] in [place]  ? CYSTOSCOPY/URETEROSCOPY/HOLMIUM LASER Right 11/24/2017  ? Procedure: CYSTOSCOPY/URETEROSCOPY/ RETROGRADE/STENT REMOVAL;  Surgeon: Raynelle Bring, MD;  [facility];  Service: Urology;  Laterality: Right;  ? HOLMIUM LASER APPLICATION Bilateral 4/0/9811  ? Procedure: HOLMIUM LASER APPLICATION;  Surgeon: [name], MD;  [facility];  Service: Urology;  Laterality: Bilateral;  ? prosatectomy    ? ?Patient Active Problem List  ? Diagnosis Date Noted  ? History of pulmonary embolism 10/10/2021  ? HTN (hypertension) 10/10/2021  ? Chronic diastolic CHF (congestive heart failure) ([place]) 10/10/2021  ? GERD (gastroesophageal reflux disease) 10/10/2021  ? Ankylosing spondylitis ([place]) 10/10/2021  ? CKD (chronic kidney disease), stage  III ([place]) 10/10/2021  ? Hyperlipidemia 10/10/2021  ? CAD (coronary artery disease) 10/10/2021  ? Periprosthetic fracture of shaft of femur 10/10/2021  ? Other fatigue 06/05/2021  ? CAD (coronary artery disease) 04/03/2021  ? Diastolic CHF ([place]) 58/52/7782  ? SOB (shortness of breath) on exertion 03/26/2021  ? Chronic anticoagulation 03/26/2021  ? Anemia in chronic kidney disease 01/25/2021  ? Acquired coagulation disorder ([place]) 12/12/2020  ? Senile purpura ([place]) 12/12/2020  ? S/P lumbar fusion 08/03/2020  ? Thin skin 07/25/2020  ? Aortic atherosclerosis ([place]) 05/17/2020  ? B12 deficiency 05/17/2020  ? Iliac artery aneurysm ([place])  05/17/2020  ? Osteoporosis 04/04/2020  ? Idiopathic neuropathy 01/28/2020  ? Recurrent falls 01/28/2020  ? Chronic kidney disease (CKD), stage III (moderate) ([place]) 01/19/2020  ? Pulmonary emboli ([place]) 01/14/2020  ? GERD (gastroesophageal reflux disease) 12/22/2019  ? Essential tremor 12/22/2019  ? Femoral condyle fracture ([place]) 07/15/2019  ? Gross hematuria 06/14/2019  ? Hyperlipidemia 11/11/2017  ? Kidney stone 11/05/2017  ? Community acquired pneumonia of right upper lobe of lung 11/05/2017  ? Right ureteral stone 11/05/2017  ? History of total right hip replacement 03/08/2017  ? History of prostate cancer 03/08/2017  ? Personal history of prostate cancer 02/28/2017  ? High risk medication use 02/28/2017  ? DDD (degenerative disc disease), thoracic 02/28/2017  ? Ataxia 05/18/2016  ? Vestibular disequilibrium 05/18/2016  ? Ankylosing spondylitis of cervical region [facility]) 05/24/2014  ? Lung nodule, solitary 05/24/2014  ? Lumbosacral stenosis 06/05/2013  ? Peripheral edema 06/08/2012  ? Subcortical microvascular ischemic occlusive disease 08/13/2010  ? Extrinsic asthma 04/07/2009  ? Backache 04/21/2008  ? History of peptic ulcer disease 04/21/2008  ? Essential hypertension 10/29/2007  ?REFERRING PROVIDER: [name], MD ?  ?REFERRING DIAG:  ?M54.50,G89.29 (ICD-10-CM) - Chronic bilateral low back pain without sciatica  ?R26.89 (ICD-10-CM) - Other abnormalities of gait and mobility  ?  ?  ?THERAPY DIAG:  ?Pain in right hip ?  ?Pain in right leg ?  ?Difficulty in walking, not elsewhere classified ?  ?Muscle weakness (generalized) ?  ?ONSET DATE: October 10, 2021 recent fall causing fracture ?  ?SUBJECTIVE:                                                                                                                                                                                          ?  ?SUBJECTIVE STATEMENT: ?I need more work on my right leg strength and endurance. I feel like the pool is the most helpful. I  still walk around my neighborhood which is .59 miles. I used my walker yesterday because of bursitis pain, feels ok today.  I think the cold weather set me back this time. Started taking lasix a few days ago after ankle swelling with taking aleve.  ?  ?PERTINENT HISTORY:  ?Stenosis, Rt THA 22 yr ago with h/o periprosthetic fx & revision in 2022, h/o back surgery  ?Hard of hearing; can hear out of Left ear ?  ?PAIN:  ?Are you having pain? yes ?NPRS scale: 3/10, not really bothering me today ?Pain location: Rt hip, groin, into knee ?  ?Aggravating factors: too much activity ?Relieving factors: rest ?  ?PRECAUTIONS: Posterior hip ?  ?WEIGHT BEARING RESTRICTIONS No ?  ?FALLS:  ?Has patient fallen in last 6 months? Yes, Number of falls: 1 ?  ?  ?PATIENT GOALS build up strength, get rid of assistance for toileting and getting in/out of bed, no AD in gait, stairs, Sons wedding in [place] first of June ?  ?  ?OBJECTIVE: *All objective findings taken at [facility] unless otherwise noted.  ?  ?PATIENT SURVEYS:  ?LEFS 39 ? 03/28/22: 43 ?  ?SCREENING FOR RED FLAGS: ?No significant findings ?  ?COGNITION: ?         Overall cognitive status: Within functional limits for tasks assessed              ?          ?SENSATION: ?         Bil hand/feet neuropathy ?  ?  ?  ?LUMBARAROM/PROM ?  ?A/PROM A/PROM  ?03/28/2022  ?Flexion  mid shin  ?Extension    ?Right lateral flexion  mid thigh  ?Left lateral flexion  lower 1/4 of thigh  ?Right rotation    ?Left rotation    ? (Blank rows = not tested) ?  ?  ?LE MMT: ?  ?MMT Right ?03/06/2022 Right ?03/28/2022  ?Hip flexion 4/5   ?Hip extension     ?Hip abduction     ?Hip adduction     ?Hip internal rotation     ?Hip external rotation     ?Knee flexion 3/5   ?Knee extension 3/5   ?Ankle dorsiflexion     ?Ankle plantarflexion     ?Ankle inversion     ?Ankle eversion     ? (Blank rows = not tested) ?  ?  ?  ?FUNCTIONAL TESTS:  ?5 times sit to stand: 38s ? 03/28/22:  16s with use of UEs ?Berg Balance Scale:  33/56 ?  ?GAIT: ?  ?EVAL: SPC in Lt hand, rt leg abducted and externally rotated, antalgic on Rt with pressure placed through cane ? ?03/28/22: Rt LE remains externally rotated but less abduction noted, using cane an

## 2022-03-29 ENCOUNTER — Ambulatory Visit
Admission: RE | Admit: 2022-03-29 | Discharge: 2022-03-29 | Disposition: A | Discharge status: Home / Self Care | Payer: Medicare Other | Source: Ambulatory Visit | Attending: Family Medicine | Admitting: Family Medicine

## 2022-03-29 DIAGNOSIS — J3489 Other specified disorders of nose and nasal sinuses: Secondary | ICD-10-CM | POA: Diagnosis not present

## 2022-03-29 DIAGNOSIS — Z8546 Personal history of malignant neoplasm of prostate: Secondary | ICD-10-CM | POA: Diagnosis not present

## 2022-03-29 DIAGNOSIS — J329 Chronic sinusitis, unspecified: Secondary | ICD-10-CM | POA: Diagnosis not present

## 2022-03-29 DIAGNOSIS — Z85118 Personal history of other malignant neoplasm of bronchus and lung: Secondary | ICD-10-CM | POA: Diagnosis not present

## 2022-03-30 ENCOUNTER — Encounter: Payer: Self-pay | Admitting: Family Medicine

## 2022-04-01 ENCOUNTER — Other Ambulatory Visit: Payer: Self-pay

## 2022-04-01 DIAGNOSIS — J329 Chronic sinusitis, unspecified: Secondary | ICD-10-CM

## 2022-04-03 ENCOUNTER — Telehealth (INDEPENDENT_AMBULATORY_CARE_PROVIDER_SITE_OTHER): Payer: Medicare Other | Admitting: Family Medicine

## 2022-04-03 ENCOUNTER — Ambulatory Visit (HOSPITAL_BASED_OUTPATIENT_CLINIC_OR_DEPARTMENT_OTHER): Payer: Medicare Other | Attending: Family Medicine | Admitting: Physical Therapy

## 2022-04-03 ENCOUNTER — Encounter: Payer: Self-pay | Admitting: Family Medicine

## 2022-04-03 VITALS — Ht 71.0 in | Wt 238.0 lb

## 2022-04-03 DIAGNOSIS — N183 Chronic kidney disease, stage 3 unspecified: Secondary | ICD-10-CM

## 2022-04-03 DIAGNOSIS — N1831 Chronic kidney disease, stage 3a: Secondary | ICD-10-CM | POA: Diagnosis not present

## 2022-04-03 DIAGNOSIS — I10 Essential (primary) hypertension: Secondary | ICD-10-CM

## 2022-04-03 DIAGNOSIS — J322 Chronic ethmoidal sinusitis: Secondary | ICD-10-CM

## 2022-04-03 DIAGNOSIS — Z1211 Encounter for screening for malignant neoplasm of colon: Secondary | ICD-10-CM | POA: Diagnosis not present

## 2022-04-03 DIAGNOSIS — E785 Hyperlipidemia, unspecified: Secondary | ICD-10-CM | POA: Diagnosis not present

## 2022-04-03 DIAGNOSIS — M6281 Muscle weakness (generalized): Secondary | ICD-10-CM

## 2022-04-03 DIAGNOSIS — I700 Atherosclerosis of aorta: Secondary | ICD-10-CM

## 2022-04-03 DIAGNOSIS — M25551 Pain in right hip: Secondary | ICD-10-CM

## 2022-04-03 DIAGNOSIS — M79604 Pain in right leg: Secondary | ICD-10-CM

## 2022-04-03 DIAGNOSIS — D631 Anemia in chronic kidney disease: Secondary | ICD-10-CM

## 2022-04-03 DIAGNOSIS — R262 Difficulty in walking, not elsewhere classified: Secondary | ICD-10-CM

## 2022-04-03 MED ORDER — TRAMADOL HCL 50 MG PO TABS: 25.0000 mg | ORAL_TABLET | Freq: Three times a day (TID) | ORAL | 0 refills | Status: DC | PRN

## 2022-04-03 NOTE — Therapy (Signed)
?  OUTPATIENT PHYSICAL THERAPY TREATMENT NOTE ? ? ? ? ?Pt arrived unprepared for aquatic session due to misunderstanding of scheduled visit.  He will return for next scheduled visit which is "on land".  He is given a print out of future appointments for clarification. ? ? ?. ? ?

## 2022-04-04 MED ORDER — TRAMADOL HCL 50 MG PO TABS: 25.0000 mg | ORAL_TABLET | Freq: Three times a day (TID) | ORAL | 0 refills | Status: AC | PRN

## 2022-04-04 NOTE — Addendum Note (Signed)
Addended by: [name] on: 04/04/2022 01:07 PM ? ? Modules accepted: Orders ? ?

## 2022-04-05 ENCOUNTER — Ambulatory Visit (HOSPITAL_BASED_OUTPATIENT_CLINIC_OR_DEPARTMENT_OTHER): Payer: Medicare Other | Admitting: Physical Therapy

## 2022-04-05 ENCOUNTER — Encounter (HOSPITAL_BASED_OUTPATIENT_CLINIC_OR_DEPARTMENT_OTHER): Payer: Self-pay | Admitting: Physical Therapy

## 2022-04-05 DIAGNOSIS — M6281 Muscle weakness (generalized): Secondary | ICD-10-CM

## 2022-04-05 DIAGNOSIS — M25551 Pain in right hip: Secondary | ICD-10-CM

## 2022-04-05 DIAGNOSIS — R262 Difficulty in walking, not elsewhere classified: Secondary | ICD-10-CM

## 2022-04-05 DIAGNOSIS — M79604 Pain in right leg: Secondary | ICD-10-CM | POA: Diagnosis not present

## 2022-04-05 NOTE — Therapy (Signed)
?OUTPATIENT PHYSICAL THERAPY TREATMENT NOTE ? ? ?Patient Name: Bruce Ball ?MRN: [id number] ?DOB:August 07, 1946, 76 y.o., male ?Today's Date: 04/05/2022 ? ?PCP: [name], MD ?REFERRING PROVIDER: [name], MD ? ? PT End of Session - 04/05/22 1357   ? ? Visit Number 7   ? Number of Visits 33   ? Date for PT Re-Evaluation 07/06/22   ? Authorization Type MCR   ? PT Start Time 1346   ? PT Stop Time 5284   ? PT Time Calculation (min) 41 min   ? Activity Tolerance Patient tolerated treatment well   ? Behavior During Therapy [facility] for tasks assessed/performed   ? ?  ?  ? ?  ? ? ? ? ?Past Medical History:  ?Diagnosis Date  ? Ankylosing spondylitis ([place])   ? Arthritis of right knee   ? Basal cell carcinoma   ? Chronic back pain greater than 3 months duration   ? Chronic leg pain   ? CKD (chronic kidney disease), stage II   ? Coronary artery calcification seen on CAT scan 03/27/2021  ? Deafness in right ear   ? Dyspnea   ? cause by PE  ? Essential tremor   ? Extrinsic asthma, unspecified 04/07/2009  ? PT DENIES  ? GERD (gastroesophageal reflux disease)   ? Hearing loss   ? Hip problem   ? History of kidney stones   ? HYPERTENSION 10/29/2007  ? HYPOTENSION 08/13/2010  ? LOW BACK PAIN 04/21/2008  ? Mild aortic stenosis   ? NEPHROLITHIASIS, HX OF 04/21/2008  ? Neuropathy   ? Osteopenia   ? Other hyperlipidemia   ? Peptic ulcer   ? Pneumonia   ? PUD, HX OF 04/21/2008  ? Pulmonary emboli ([place]) 2021  ? noted on CT 12-2019  ? Renal disorder   ? Stage 3 chronic kidney disease ([place])   ? Thyroid nodule   ? ?Past Surgical History:  ?Procedure Laterality Date  ? BACK SURGERY    ? bone cancer  1970  ? rt femur removed and steel rod placed  ? CYSTOSCOPY WITH RETROGRADE PYELOGRAM, URETEROSCOPY AND STENT PLACEMENT Right 11/06/2017  ? Procedure: CYSTOSCOPY WITH  STENT PLACEMENT RIGHT;  Surgeon: Raynelle Bring, MD;  Location: [facility];  Service: Urology;  Laterality: Right;  ? CYSTOSCOPY WITH RETROGRADE PYELOGRAM, URETEROSCOPY AND STENT  PLACEMENT Left 06/09/2019  ? Procedure: CYSTOSCOPY WITH RETROGRADE PYELOGRAM, URETEROSCOPY AND STENT PLACEMENT X2;  Surgeon: [name], MD;  Location: [facility];  Service: Urology;  Laterality: Left;  ? CYSTOSCOPY WITH RETROGRADE PYELOGRAM, URETEROSCOPY AND STENT PLACEMENT Bilateral 03/01/2020  ? Procedure: CYSTOSCOPY WITH RETROGRADE PYELOGRAM, URETEROSCOPY AND STENT PLACEMENT;  Surgeon: [name], MD;  Location: [facility];  Service: Urology;  Laterality: Bilateral;  75 MINS  ? CYSTOSCOPY/RETROGRADE/URETEROSCOPY    ? 1 year ago at [place] in [place]  ? CYSTOSCOPY/URETEROSCOPY/HOLMIUM LASER Right 11/24/2017  ? Procedure: CYSTOSCOPY/URETEROSCOPY/ RETROGRADE/STENT REMOVAL;  Surgeon: Raynelle Bring, MD;  [facility];  Service: Urology;  Laterality: Right;  ? HOLMIUM LASER APPLICATION Bilateral 01/01/2439  ? Procedure: HOLMIUM LASER APPLICATION;  Surgeon: [name], MD;  Location: [facility];  Service: Urology;  Laterality: Bilateral;  ? prosatectomy    ? ?Patient Active Problem List  ? Diagnosis Date Noted  ? History of pulmonary embolism 10/10/2021  ? HTN (hypertension) 10/10/2021  ? Chronic diastolic CHF (congestive heart failure) ([place]) 10/10/2021  ? GERD (gastroesophageal reflux disease) 10/10/2021  ? Ankylosing spondylitis ([place]) 10/10/2021  ? CKD (chronic kidney disease),  stage III ([place]) 10/10/2021  ? Hyperlipidemia 10/10/2021  ? CAD (coronary artery disease) 10/10/2021  ? Periprosthetic fracture of shaft of femur 10/10/2021  ? Other fatigue 06/05/2021  ? CAD (coronary artery disease) 04/03/2021  ? Diastolic CHF ([place]) 16/09/9603  ? SOB (shortness of breath) on exertion 03/26/2021  ? Chronic anticoagulation 03/26/2021  ? Anemia in chronic kidney disease 01/25/2021  ? Acquired coagulation disorder ([place]) 12/12/2020  ? Senile purpura ([place]) 12/12/2020  ? S/P lumbar fusion 08/03/2020  ? Thin skin 07/25/2020  ? Aortic atherosclerosis ([place]) 05/17/2020  ? B12 deficiency 05/17/2020  ? Iliac artery aneurysm ([place])  05/17/2020  ? Osteoporosis 04/04/2020  ? Idiopathic neuropathy 01/28/2020  ? Recurrent falls 01/28/2020  ? Chronic kidney disease (CKD), stage III (moderate) ([place]) 01/19/2020  ? Pulmonary emboli ([place]) 01/14/2020  ? GERD (gastroesophageal reflux disease) 12/22/2019  ? Essential tremor 12/22/2019  ? Femoral condyle fracture ([place]) 07/15/2019  ? Gross hematuria 06/14/2019  ? Hyperlipidemia 11/11/2017  ? Kidney stone 11/05/2017  ? Community acquired pneumonia of right upper lobe of lung 11/05/2017  ? Right ureteral stone 11/05/2017  ? History of total right hip replacement 03/08/2017  ? History of prostate cancer 03/08/2017  ? Personal history of prostate cancer 02/28/2017  ? High risk medication use 02/28/2017  ? DDD (degenerative disc disease), thoracic 02/28/2017  ? Ataxia 05/18/2016  ? Vestibular disequilibrium 05/18/2016  ? Ankylosing spondylitis of cervical region [facility]) 05/24/2014  ? Lung nodule, solitary 05/24/2014  ? Lumbosacral stenosis 06/05/2013  ? Peripheral edema 06/08/2012  ? Subcortical microvascular ischemic occlusive disease 08/13/2010  ? Extrinsic asthma 04/07/2009  ? Backache 04/21/2008  ? History of peptic ulcer disease 04/21/2008  ? Essential hypertension 10/29/2007  ?REFERRING PROVIDER: [name], MD ?  ?REFERRING DIAG:  ?M54.50,G89.29 (ICD-10-CM) - Chronic bilateral low back pain without sciatica  ?R26.89 (ICD-10-CM) - Other abnormalities of gait and mobility  ?  ?  ?THERAPY DIAG:  ?Pain in right hip ?  ?Pain in right leg ?  ?Difficulty in walking, not elsewhere classified ?  ?Muscle weakness (generalized) ?  ?ONSET DATE: October 10, 2021 recent fall causing fracture ?  ?SUBJECTIVE:                                                                                                                                                                                          ?  ?SUBJECTIVE STATEMENT: ?Doing okay.  ?  ?PERTINENT HISTORY:  ?Stenosis, Rt THA 51 yr ago with h/o periprosthetic fx &  revision in 2022, h/o back surgery  ?Hard of hearing; can hear out of Left ear ?  ?PAIN:  ?Are you  having pain? yes ?NPRS scale: 3/10, not really bothering me today ?Pain location: Rt hip, groin, into knee ?  ?Aggravating factors: too much activity ?Relieving factors: rest ?  ?PRECAUTIONS: Posterior hip ?  ?WEIGHT BEARING RESTRICTIONS No ?  ?FALLS:  ?Has patient fallen in last 6 months? Yes, Number of falls: 1 ?  ?  ?PATIENT GOALS build up strength, get rid of assistance for toileting and getting in/out of bed, no AD in gait, stairs, Sons wedding in [place] first of June ?  ?  ?OBJECTIVE: *All objective findings taken at [facility] unless otherwise noted.  ?  ?PATIENT SURVEYS:  ?LEFS 39 ? 03/28/22: 43 ?  ?SCREENING FOR RED FLAGS: ?No significant findings ?  ?COGNITION: ?         Overall cognitive status: Within functional limits for tasks assessed              ?          ?SENSATION: ?         Bil hand/feet neuropathy ?  ?  ?  ?LUMBARAROM/PROM ?  ?A/PROM A/PROM  ?03/28/2022  ?Flexion  mid shin  ?Extension    ?Right lateral flexion  mid thigh  ?Left lateral flexion  lower 1/4 of thigh  ?Right rotation    ?Left rotation    ? (Blank rows = not tested) ?  ?  ?LE MMT: ?  ?MMT Right ?03/06/2022 Right ?03/28/2022  ?Hip flexion 4/5   ?Hip extension     ?Hip abduction     ?Hip adduction     ?Hip internal rotation     ?Hip external rotation     ?Knee flexion 3/5   ?Knee extension 3/5   ?Ankle dorsiflexion     ?Ankle plantarflexion     ?Ankle inversion     ?Ankle eversion     ? (Blank rows = not tested) ?  ?  ?  ?FUNCTIONAL TESTS:  ?5 times sit to stand: 38s ? 03/28/22:  16s with use of UEs ?Berg Balance Scale: 33/56 ?  ?GAIT: ?  ?EVAL: SPC in Lt hand, rt leg abducted and externally rotated, antalgic on Rt with pressure placed through cane ? ?03/28/22: Rt LE remains externally rotated but less abduction noted, using cane and mild antalgic gait present ?  ?  ? TODAY'S TREATMENT  ?4/7: ?Nu step 5 min L5 LE only ?4" step taps ?One foot on 4"  step- rocking weight shift ?LAQ ball bw knees- 2.5 lb ?Seated march with ball to chest, added trunk rotation ?Step ups- only step and then added risers but was only able to tolerate 6 steps at that height ?clams ? ?3/

## 2022-04-07 DIAGNOSIS — I2699 Other pulmonary embolism without acute cor pulmonale: Secondary | ICD-10-CM | POA: Diagnosis not present

## 2022-04-08 ENCOUNTER — Ambulatory Visit (HOSPITAL_BASED_OUTPATIENT_CLINIC_OR_DEPARTMENT_OTHER): Payer: Medicare Other | Admitting: Physical Therapy

## 2022-04-10 ENCOUNTER — Encounter (HOSPITAL_BASED_OUTPATIENT_CLINIC_OR_DEPARTMENT_OTHER): Payer: Self-pay | Admitting: Physical Therapy

## 2022-04-10 ENCOUNTER — Ambulatory Visit (HOSPITAL_BASED_OUTPATIENT_CLINIC_OR_DEPARTMENT_OTHER): Payer: Medicare Other | Admitting: Physical Therapy

## 2022-04-10 DIAGNOSIS — M25551 Pain in right hip: Secondary | ICD-10-CM

## 2022-04-10 DIAGNOSIS — M79604 Pain in right leg: Secondary | ICD-10-CM

## 2022-04-10 DIAGNOSIS — M6281 Muscle weakness (generalized): Secondary | ICD-10-CM | POA: Diagnosis not present

## 2022-04-10 DIAGNOSIS — R262 Difficulty in walking, not elsewhere classified: Secondary | ICD-10-CM | POA: Diagnosis not present

## 2022-04-10 NOTE — Therapy (Signed)
?OUTPATIENT PHYSICAL THERAPY TREATMENT NOTE ? ? ?Patient Name: Bruce Ball ?MRN: [id number] ?DOB:Jul 09, 1946, 76 y.o., male ?Today's Date: 04/10/2022 ? ?PCP: [name], MD ?REFERRING PROVIDER: [name], MD ? ? PT End of Session - 04/10/22 1159   ? ? Visit Number 8   ? Number of Visits 33   ? Date for PT Re-Evaluation 07/06/22   ? Authorization Type MCR   ? PT Start Time 1155   ? PT Stop Time 1235   ? PT Time Calculation (min) 40 min   ? Activity Tolerance Patient tolerated treatment well   ? Behavior During Therapy [facility] for tasks assessed/performed   ? ?  ?  ? ?  ? ? ? ? ?Past Medical History:  ?Diagnosis Date  ? Ankylosing spondylitis ([place])   ? Arthritis of right knee   ? Basal cell carcinoma   ? Chronic back pain greater than 3 months duration   ? Chronic leg pain   ? CKD (chronic kidney disease), stage II   ? Coronary artery calcification seen on CAT scan 03/27/2021  ? Deafness in right ear   ? Dyspnea   ? cause by PE  ? Essential tremor   ? Extrinsic asthma, unspecified 04/07/2009  ? PT DENIES  ? GERD (gastroesophageal reflux disease)   ? Hearing loss   ? Hip problem   ? History of kidney stones   ? HYPERTENSION 10/29/2007  ? HYPOTENSION 08/13/2010  ? LOW BACK PAIN 04/21/2008  ? Mild aortic stenosis   ? NEPHROLITHIASIS, HX OF 04/21/2008  ? Neuropathy   ? Osteopenia   ? Other hyperlipidemia   ? Peptic ulcer   ? Pneumonia   ? PUD, HX OF 04/21/2008  ? Pulmonary emboli ([place]) 2021  ? noted on CT 12-2019  ? Renal disorder   ? Stage 3 chronic kidney disease ([place])   ? Thyroid nodule   ? ?Past Surgical History:  ?Procedure Laterality Date  ? BACK SURGERY    ? bone cancer  1970  ? rt femur removed and steel rod placed  ? CYSTOSCOPY WITH RETROGRADE PYELOGRAM, URETEROSCOPY AND STENT PLACEMENT Right 11/06/2017  ? Procedure: CYSTOSCOPY WITH  STENT PLACEMENT RIGHT;  Surgeon: Raynelle Bring, MD;  [facility];  Service: Urology;  Laterality: Right;  ? CYSTOSCOPY WITH RETROGRADE PYELOGRAM, URETEROSCOPY AND STENT  PLACEMENT Left 06/09/2019  ? Procedure: CYSTOSCOPY WITH RETROGRADE PYELOGRAM, URETEROSCOPY AND STENT PLACEMENT X2;  Surgeon: [name], MD;  [facility];  Service: Urology;  Laterality: Left;  ? CYSTOSCOPY WITH RETROGRADE PYELOGRAM, URETEROSCOPY AND STENT PLACEMENT Bilateral 03/01/2020  ? Procedure: CYSTOSCOPY WITH RETROGRADE PYELOGRAM, URETEROSCOPY AND STENT PLACEMENT;  Surgeon: [name], MD;  [facility];  Service: Urology;  Laterality: Bilateral;  75 MINS  ? CYSTOSCOPY/RETROGRADE/URETEROSCOPY    ? 1 year ago at [place] in [place]  ? CYSTOSCOPY/URETEROSCOPY/HOLMIUM LASER Right 11/24/2017  ? Procedure: CYSTOSCOPY/URETEROSCOPY/ RETROGRADE/STENT REMOVAL;  Surgeon: Raynelle Bring, MD;  [facility];  Service: Urology;  Laterality: Right;  ? HOLMIUM LASER APPLICATION Bilateral 06/01/1496  ? Procedure: HOLMIUM LASER APPLICATION;  Surgeon: [name], MD;  [facility];  Service: Urology;  Laterality: Bilateral;  ? prosatectomy    ? ?Patient Active Problem List  ? Diagnosis Date Noted  ? History of pulmonary embolism 10/10/2021  ? HTN (hypertension) 10/10/2021  ? Chronic diastolic CHF (congestive heart failure) ([place]) 10/10/2021  ? GERD (gastroesophageal reflux disease) 10/10/2021  ? Ankylosing spondylitis ([place]) 10/10/2021  ? CKD (chronic kidney disease),  stage III ([place]) 10/10/2021  ? Hyperlipidemia 10/10/2021  ? CAD (coronary artery disease) 10/10/2021  ? Periprosthetic fracture of shaft of femur 10/10/2021  ? Other fatigue 06/05/2021  ? CAD (coronary artery disease) 04/03/2021  ? Diastolic CHF ([place]) 69/62/9528  ? SOB (shortness of breath) on exertion 03/26/2021  ? Chronic anticoagulation 03/26/2021  ? Anemia in chronic kidney disease 01/25/2021  ? Acquired coagulation disorder ([place]) 12/12/2020  ? Senile purpura ([place]) 12/12/2020  ? S/P lumbar fusion 08/03/2020  ? Thin skin 07/25/2020  ? Aortic atherosclerosis ([place]) 05/17/2020  ? B12 deficiency 05/17/2020  ? Iliac artery aneurysm ([place])  05/17/2020  ? Osteoporosis 04/04/2020  ? Idiopathic neuropathy 01/28/2020  ? Recurrent falls 01/28/2020  ? Chronic kidney disease (CKD), stage III (moderate) ([place]) 01/19/2020  ? Pulmonary emboli ([place]) 01/14/2020  ? GERD (gastroesophageal reflux disease) 12/22/2019  ? Essential tremor 12/22/2019  ? Femoral condyle fracture ([place]) 07/15/2019  ? Gross hematuria 06/14/2019  ? Hyperlipidemia 11/11/2017  ? Kidney stone 11/05/2017  ? Community acquired pneumonia of right upper lobe of lung 11/05/2017  ? Right ureteral stone 11/05/2017  ? History of total right hip replacement 03/08/2017  ? History of prostate cancer 03/08/2017  ? Personal history of prostate cancer 02/28/2017  ? High risk medication use 02/28/2017  ? DDD (degenerative disc disease), thoracic 02/28/2017  ? Ataxia 05/18/2016  ? Vestibular disequilibrium 05/18/2016  ? Ankylosing spondylitis of cervical region [facility]) 05/24/2014  ? Lung nodule, solitary 05/24/2014  ? Lumbosacral stenosis 06/05/2013  ? Peripheral edema 06/08/2012  ? Subcortical microvascular ischemic occlusive disease 08/13/2010  ? Extrinsic asthma 04/07/2009  ? Backache 04/21/2008  ? History of peptic ulcer disease 04/21/2008  ? Essential hypertension 10/29/2007  ?REFERRING PROVIDER: [name], MD ?  ?REFERRING DIAG:  ?M54.50,G89.29 (ICD-10-CM) - Chronic bilateral low back pain without sciatica  ?R26.89 (ICD-10-CM) - Other abnormalities of gait and mobility  ?  ?  ?THERAPY DIAG:  ?Pain in right hip ?  ?Pain in right leg ?  ?Difficulty in walking, not elsewhere classified ?  ?Muscle weakness (generalized) ?  ?ONSET DATE: October 10, 2021 recent fall causing fracture ?  ?SUBJECTIVE:                                                                                                                                                                                          ?  ?SUBJECTIVE STATEMENT: ?Pt has been walking in pool [employer] 1 hr, 5 days/ wk.  When weather is good in afternoons, he walks in  neighborhood (1/4 mile with someone) ?  ?PERTINENT HISTORY:  ?Stenosis, Rt THA 70 yr ago with h/o periprosthetic  fx & revision in 2022, h/o back surgery  ?Hard of hearing; can hear out of Left ear ?  ?PAIN:  ?Are you having pain? no ?NPRS scale: 0/10,  ?Pain location: n/a ?  ?Aggravating factors: too much activity ?Relieving factors: rest ?  ?PRECAUTIONS: Posterior hip ?  ?WEIGHT BEARING RESTRICTIONS No ?  ?FALLS:  ?Has patient fallen in last 6 months? Yes, Number of falls: 1 ?  ?  ?PATIENT GOALS build up strength, get rid of assistance for toileting and getting in/out of bed, no AD in gait, stairs, Sons wedding in [place] first of June ?  ?  ?OBJECTIVE: *All objective findings taken at [facility] unless otherwise noted.  ?  ?PATIENT SURVEYS:  ?LEFS 39 ? 03/28/22: 43 ?  ?SCREENING FOR RED FLAGS: ?No significant findings ?  ?COGNITION: ?         Overall cognitive status: Within functional limits for tasks assessed              ?          ?SENSATION: ?         Bil hand/feet neuropathy ?  ?  ?  ?LUMBARAROM/PROM ?  ?A/PROM A/PROM  ?03/28/2022  ?Flexion  mid shin  ?Extension    ?Right lateral flexion  mid thigh  ?Left lateral flexion  lower 1/4 of thigh  ?Right rotation    ?Left rotation    ? (Blank rows = not tested) ?  ?  ?LE MMT: ?  ?MMT Right ?03/06/2022 Right ?03/28/2022  ?Hip flexion 4/5   ?Hip extension     ?Hip abduction     ?Hip adduction     ?Hip internal rotation     ?Hip external rotation     ?Knee flexion 3/5   ?Knee extension 3/5   ?Ankle dorsiflexion     ?Ankle plantarflexion     ?Ankle inversion     ?Ankle eversion     ? (Blank rows = not tested) ?  ?  ?  ?FUNCTIONAL TESTS:  ?5 times sit to stand: 38s ? 03/28/22:  16s with use of UEs ? 04/10/22: 19.85 without use of UE, airex in chair ? ?Berg Balance Scale: 33/56 ?  ?GAIT: ?  ?EVAL: SPC in Lt hand, rt leg abducted and externally rotated, antalgic on Rt with pressure placed through cane ? ?03/28/22: Rt LE remains externally rotated but less abduction noted, using cane  and mild antalgic gait present ?  ?  ? TODAY'S TREATMENT  ?4/12: ?Nu step 5 min, L5 LE only ?4" step taps x 5 each; 6" toe taps x 5 each leg - UE on rail ?Side stepping Lt/Rt with UE on rail x 10 ft x 2 ?[place]

## 2022-04-12 ENCOUNTER — Encounter (HOSPITAL_BASED_OUTPATIENT_CLINIC_OR_DEPARTMENT_OTHER): Payer: Medicare Other | Admitting: Physical Therapy

## 2022-04-17 ENCOUNTER — Ambulatory Visit (HOSPITAL_BASED_OUTPATIENT_CLINIC_OR_DEPARTMENT_OTHER): Payer: Medicare Other | Admitting: Physical Therapy

## 2022-04-17 ENCOUNTER — Encounter (HOSPITAL_BASED_OUTPATIENT_CLINIC_OR_DEPARTMENT_OTHER): Payer: Self-pay | Admitting: Physical Therapy

## 2022-04-17 DIAGNOSIS — M6281 Muscle weakness (generalized): Secondary | ICD-10-CM

## 2022-04-17 DIAGNOSIS — R262 Difficulty in walking, not elsewhere classified: Secondary | ICD-10-CM

## 2022-04-17 DIAGNOSIS — M79604 Pain in right leg: Secondary | ICD-10-CM

## 2022-04-17 DIAGNOSIS — M25551 Pain in right hip: Secondary | ICD-10-CM | POA: Diagnosis not present

## 2022-04-17 NOTE — Therapy (Signed)
?OUTPATIENT PHYSICAL THERAPY TREATMENT NOTE ? ? ?Patient Name: Bruce Ball ?MRN: [id number] ?DOB:1946-03-06, 76 y.o., male ?Today's Date: 04/17/2022 ? ?PCP: [name], MD ?REFERRING PROVIDER: [name], MD ? ? PT End of Session - 04/17/22 1157   ? ? Visit Number 9   ? Number of Visits 33   ? Date for PT Re-Evaluation 07/06/22   ? Authorization Type MCR   ? PT Start Time 1150   ? PT Stop Time 1231   ? PT Time Calculation (min) 41 min   ? Activity Tolerance Patient tolerated treatment well   ? Behavior During Therapy [facility] for tasks assessed/performed   ? ?  ?  ? ?  ? ? ? ?Past Medical History:  ?Diagnosis Date  ? Ankylosing spondylitis ([place])   ? Arthritis of right knee   ? Basal cell carcinoma   ? Chronic back pain greater than 3 months duration   ? Chronic leg pain   ? CKD (chronic kidney disease), stage II   ? Coronary artery calcification seen on CAT scan 03/27/2021  ? Deafness in right ear   ? Dyspnea   ? cause by PE  ? Essential tremor   ? Extrinsic asthma, unspecified 04/07/2009  ? PT DENIES  ? GERD (gastroesophageal reflux disease)   ? Hearing loss   ? Hip problem   ? History of kidney stones   ? HYPERTENSION 10/29/2007  ? HYPOTENSION 08/13/2010  ? LOW BACK PAIN 04/21/2008  ? Mild aortic stenosis   ? NEPHROLITHIASIS, HX OF 04/21/2008  ? Neuropathy   ? Osteopenia   ? Other hyperlipidemia   ? Peptic ulcer   ? Pneumonia   ? PUD, HX OF 04/21/2008  ? Pulmonary emboli ([place]) 2021  ? noted on CT 12-2019  ? Renal disorder   ? Stage 3 chronic kidney disease ([place])   ? Thyroid nodule   ? ?Past Surgical History:  ?Procedure Laterality Date  ? BACK SURGERY    ? bone cancer  1970  ? rt femur removed and steel rod placed  ? CYSTOSCOPY WITH RETROGRADE PYELOGRAM, URETEROSCOPY AND STENT PLACEMENT Right 11/06/2017  ? Procedure: CYSTOSCOPY WITH  STENT PLACEMENT RIGHT;  Surgeon: Raynelle Bring, MD;  [facility];  Service: Urology;  Laterality: Right;  ? CYSTOSCOPY WITH RETROGRADE PYELOGRAM, URETEROSCOPY AND STENT  PLACEMENT Left 06/09/2019  ? Procedure: CYSTOSCOPY WITH RETROGRADE PYELOGRAM, URETEROSCOPY AND STENT PLACEMENT X2;  Surgeon: [name], MD;  [facility];  Service: Urology;  Laterality: Left;  ? CYSTOSCOPY WITH RETROGRADE PYELOGRAM, URETEROSCOPY AND STENT PLACEMENT Bilateral 03/01/2020  ? Procedure: CYSTOSCOPY WITH RETROGRADE PYELOGRAM, URETEROSCOPY AND STENT PLACEMENT;  Surgeon: [name], MD;  [facility];  Service: Urology;  Laterality: Bilateral;  75 MINS  ? CYSTOSCOPY/RETROGRADE/URETEROSCOPY    ? 1 year ago at [place] in [place]  ? CYSTOSCOPY/URETEROSCOPY/HOLMIUM LASER Right 11/24/2017  ? Procedure: CYSTOSCOPY/URETEROSCOPY/ RETROGRADE/STENT REMOVAL;  Surgeon: Raynelle Bring, MD;  [facility];  Service: Urology;  Laterality: Right;  ? HOLMIUM LASER APPLICATION Bilateral 07/02/1286  ? Procedure: HOLMIUM LASER APPLICATION;  Surgeon: [name], MD;  [facility];  Service: Urology;  Laterality: Bilateral;  ? prosatectomy    ? ?Patient Active Problem List  ? Diagnosis Date Noted  ? History of pulmonary embolism 10/10/2021  ? HTN (hypertension) 10/10/2021  ? Chronic diastolic CHF (congestive heart failure) ([place]) 10/10/2021  ? GERD (gastroesophageal reflux disease) 10/10/2021  ? Ankylosing spondylitis ([place]) 10/10/2021  ? CKD (chronic kidney disease), stage  III ([employer]) 10/10/2021  ? Hyperlipidemia 10/10/2021  ? CAD (coronary artery disease) 10/10/2021  ? Periprosthetic fracture of shaft of femur 10/10/2021  ? Other fatigue 06/05/2021  ? CAD (coronary artery disease) 04/03/2021  ? Diastolic CHF ([place]) 84/53/6468  ? SOB (shortness of breath) on exertion 03/26/2021  ? Chronic anticoagulation 03/26/2021  ? Anemia in chronic kidney disease 01/25/2021  ? Acquired coagulation disorder ([place]) 12/12/2020  ? Senile purpura ([place]) 12/12/2020  ? S/P lumbar fusion 08/03/2020  ? Thin skin 07/25/2020  ? Aortic atherosclerosis ([place]) 05/17/2020  ? B12 deficiency 05/17/2020  ? Iliac artery aneurysm ([place])  05/17/2020  ? Osteoporosis 04/04/2020  ? Idiopathic neuropathy 01/28/2020  ? Recurrent falls 01/28/2020  ? Chronic kidney disease (CKD), stage III (moderate) ([place]) 01/19/2020  ? Pulmonary emboli ([place]) 01/14/2020  ? GERD (gastroesophageal reflux disease) 12/22/2019  ? Essential tremor 12/22/2019  ? Femoral condyle fracture ([place]) 07/15/2019  ? Gross hematuria 06/14/2019  ? Hyperlipidemia 11/11/2017  ? Kidney stone 11/05/2017  ? Community acquired pneumonia of right upper lobe of lung 11/05/2017  ? Right ureteral stone 11/05/2017  ? History of total right hip replacement 03/08/2017  ? History of prostate cancer 03/08/2017  ? Personal history of prostate cancer 02/28/2017  ? High risk medication use 02/28/2017  ? DDD (degenerative disc disease), thoracic 02/28/2017  ? Ataxia 05/18/2016  ? Vestibular disequilibrium 05/18/2016  ? Ankylosing spondylitis of cervical region [facility]) 05/24/2014  ? Lung nodule, solitary 05/24/2014  ? Lumbosacral stenosis 06/05/2013  ? Peripheral edema 06/08/2012  ? Subcortical microvascular ischemic occlusive disease 08/13/2010  ? Extrinsic asthma 04/07/2009  ? Backache 04/21/2008  ? History of peptic ulcer disease 04/21/2008  ? Essential hypertension 10/29/2007  ?REFERRING PROVIDER: [name], MD ?  ?REFERRING DIAG:  ?M54.50,G89.29 (ICD-10-CM) - Chronic bilateral low back pain without sciatica  ?R26.89 (ICD-10-CM) - Other abnormalities of gait and mobility  ?  ?  ?THERAPY DIAG:  ?Pain in right hip ?  ?Pain in right leg ?  ?Difficulty in walking, not elsewhere classified ?  ?Muscle weakness (generalized) ?  ?ONSET DATE: October 10, 2021 recent fall causing fracture ?  ?SUBJECTIVE:                                                                                                                                                                                          ?  ?SUBJECTIVE STATEMENT: ?Pt reports that he walked 0.3 miles with Home Health 2 days ago. Yesterday was too cold, so he walked  around kitchen and did exercises (HEP). RLE hurts worse today than last visit.  He was cleared by Dermatologist to go in  water (had moles removed on body, specifically the Lt thigh) ?  ?  ?PERTINENT HISTORY:  ?Stenosis, Rt THA 63 yr ago with h/o periprosthetic fx & revision in 2022, h/o back surgery  ?Hard of hearing; can hear out of Left ear ?  ?PAIN:  ?Are you having pain? yes ?NPRS scale: 5/10 ?Pain location: Rt hip, groin, into knee ?  ?Aggravating factors: too much activity ?Relieving factors: rest ?  ?PRECAUTIONS: Posterior hip ?  ?WEIGHT BEARING RESTRICTIONS No ?  ?FALLS:  ?Has patient fallen in last 6 months? Yes, Number of falls: 1 ?  ?  ?PATIENT GOALS build up strength, get rid of assistance for toileting and getting in/out of bed, no AD in gait, stairs, Sons wedding in [place] first of June ?  ?  ?OBJECTIVE: *All objective findings taken at [facility] unless otherwise noted.  ?  ?PATIENT SURVEYS:  ?LEFS 39 ?  ?SCREENING FOR RED FLAGS: ?No significant findings ?  ?COGNITION: ?         Overall cognitive status: Within functional limits for tasks assessed              ?          ?SENSATION: ?         Bil hand/feet neuropathy ?  ?  ?  ?LUMBARAROM/PROM ?  ?A/PROM A/PROM  ?03/06/2022  ?Flexion    ?Extension    ?Right lateral flexion    ?Left lateral flexion    ?Right rotation    ?Left rotation    ? (Blank rows = not tested) ?  ?  ?LE MMT: ?  ?MMT Right ?03/06/2022  ?Hip flexion 4/5  ?Hip extension    ?Hip abduction    ?Hip adduction    ?Hip internal rotation    ?Hip external rotation    ?Knee flexion 3/5  ?Knee extension 3/5  ?Ankle dorsiflexion    ?Ankle plantarflexion    ?Ankle inversion    ?Ankle eversion    ? (Blank rows = not tested) ?  ?  ?  ?FUNCTIONAL TESTS:  ?5 times sit to stand: 38s ?Berg Balance Scale: 33/56 ?  ?GAIT: ?  ?Comments: SPC in Lt hand, rt leg abducted and externally rotated, antalgic on Rt with pressure placed through cane ?  ?  ?  ?TODAY'S TREATMENT  ?Pt seen for aquatic therapy today.  Treatment  took place in water 3.25-4.8 ft in depth at the [employer] pool. Temp of water was 91?.  Pt entered/exited the pool via stairs independently with bilat rail, step-to pattern. ? ?Walking forward, bac

## 2022-04-19 ENCOUNTER — Ambulatory Visit (HOSPITAL_BASED_OUTPATIENT_CLINIC_OR_DEPARTMENT_OTHER): Payer: Medicare Other | Admitting: Physical Therapy

## 2022-04-19 ENCOUNTER — Telehealth: Payer: Self-pay | Admitting: Family Medicine

## 2022-04-19 ENCOUNTER — Encounter (HOSPITAL_BASED_OUTPATIENT_CLINIC_OR_DEPARTMENT_OTHER): Payer: Self-pay | Admitting: Physical Therapy

## 2022-04-19 DIAGNOSIS — M79604 Pain in right leg: Secondary | ICD-10-CM | POA: Diagnosis not present

## 2022-04-19 DIAGNOSIS — M25551 Pain in right hip: Secondary | ICD-10-CM | POA: Diagnosis not present

## 2022-04-19 DIAGNOSIS — R262 Difficulty in walking, not elsewhere classified: Secondary | ICD-10-CM

## 2022-04-19 DIAGNOSIS — N189 Chronic kidney disease, unspecified: Secondary | ICD-10-CM | POA: Diagnosis not present

## 2022-04-19 DIAGNOSIS — D631 Anemia in chronic kidney disease: Secondary | ICD-10-CM | POA: Diagnosis not present

## 2022-04-19 DIAGNOSIS — M6281 Muscle weakness (generalized): Secondary | ICD-10-CM

## 2022-04-19 DIAGNOSIS — N1832 Chronic kidney disease, stage 3b: Secondary | ICD-10-CM | POA: Diagnosis not present

## 2022-04-19 DIAGNOSIS — I5032 Chronic diastolic (congestive) heart failure: Secondary | ICD-10-CM | POA: Diagnosis not present

## 2022-04-19 DIAGNOSIS — R809 Proteinuria, unspecified: Secondary | ICD-10-CM | POA: Diagnosis not present

## 2022-04-19 DIAGNOSIS — Z87442 Personal history of urinary calculi: Secondary | ICD-10-CM | POA: Diagnosis not present

## 2022-04-19 DIAGNOSIS — I129 Hypertensive chronic kidney disease with stage 1 through stage 4 chronic kidney disease, or unspecified chronic kidney disease: Secondary | ICD-10-CM | POA: Diagnosis not present

## 2022-04-19 DIAGNOSIS — I2699 Other pulmonary embolism without acute cor pulmonale: Secondary | ICD-10-CM | POA: Diagnosis not present

## 2022-04-19 NOTE — Therapy (Signed)
?OUTPATIENT PHYSICAL THERAPY TREATMENT NOTE ? ?Progress Note ?Reporting Period 03/06/22 to 04/19/2022 ? ? ?See note below for Objective Data and Assessment of Progress/Goals.  ? ? ? ? ? ?Patient Name: Bruce Ball ?MRN: [id number] ?DOB:May 19, 1946, 76 y.o., male ?Today's Date: 04/19/2022 ? ?PCP: [name], MD ?REFERRING PROVIDER: [name], MD ? ? PT End of Session - 04/19/22 1107   ? ? Visit Number 10   ? Number of Visits 33   ? Date for PT Re-Evaluation 07/06/22   ? Authorization Type MCR   ? Progress Note Due on Visit 20   ? PT Start Time 1107   ? PT Stop Time 6440   ? PT Time Calculation (min) 35 min   ? Activity Tolerance Patient tolerated treatment well   ? Behavior During Therapy [facility] for tasks assessed/performed   ? ?  ?  ? ?  ? ? ? ?Past Medical History:  ?Diagnosis Date  ? Ankylosing spondylitis ([place])   ? Arthritis of right knee   ? Basal cell carcinoma   ? Chronic back pain greater than 3 months duration   ? Chronic leg pain   ? CKD (chronic kidney disease), stage II   ? Coronary artery calcification seen on CAT scan 03/27/2021  ? Deafness in right ear   ? Dyspnea   ? cause by PE  ? Essential tremor   ? Extrinsic asthma, unspecified 04/07/2009  ? PT DENIES  ? GERD (gastroesophageal reflux disease)   ? Hearing loss   ? Hip problem   ? History of kidney stones   ? HYPERTENSION 10/29/2007  ? HYPOTENSION 08/13/2010  ? LOW BACK PAIN 04/21/2008  ? Mild aortic stenosis   ? NEPHROLITHIASIS, HX OF 04/21/2008  ? Neuropathy   ? Osteopenia   ? Other hyperlipidemia   ? Peptic ulcer   ? Pneumonia   ? PUD, HX OF 04/21/2008  ? Pulmonary emboli ([place]) 2021  ? noted on CT 12-2019  ? Renal disorder   ? Stage 3 chronic kidney disease ([place])   ? Thyroid nodule   ? ?Past Surgical History:  ?Procedure Laterality Date  ? BACK SURGERY    ? bone cancer  1970  ? rt femur removed and steel rod placed  ? CYSTOSCOPY WITH RETROGRADE PYELOGRAM, URETEROSCOPY AND STENT PLACEMENT Right 11/06/2017  ? Procedure: CYSTOSCOPY WITH  STENT  PLACEMENT RIGHT;  Surgeon: Raynelle Bring, MD;  [facility];  Service: Urology;  Laterality: Right;  ? CYSTOSCOPY WITH RETROGRADE PYELOGRAM, URETEROSCOPY AND STENT PLACEMENT Left 06/09/2019  ? Procedure: CYSTOSCOPY WITH RETROGRADE PYELOGRAM, URETEROSCOPY AND STENT PLACEMENT X2;  Surgeon: [name], MD;  [facility];  Service: Urology;  Laterality: Left;  ? CYSTOSCOPY WITH RETROGRADE PYELOGRAM, URETEROSCOPY AND STENT PLACEMENT Bilateral 03/01/2020  ? Procedure: CYSTOSCOPY WITH RETROGRADE PYELOGRAM, URETEROSCOPY AND STENT PLACEMENT;  Surgeon: [name], MD;  [facility];  Service: Urology;  Laterality: Bilateral;  75 MINS  ? CYSTOSCOPY/RETROGRADE/URETEROSCOPY    ? 1 year ago at [place] in [place]  ? CYSTOSCOPY/URETEROSCOPY/HOLMIUM LASER Right 11/24/2017  ? Procedure: CYSTOSCOPY/URETEROSCOPY/ RETROGRADE/STENT REMOVAL;  Surgeon: Raynelle Bring, MD;  [facility];  Service: Urology;  Laterality: Right;  ? HOLMIUM LASER APPLICATION Bilateral 03/02/7424  ? Procedure: HOLMIUM LASER APPLICATION;  Surgeon: [name], MD;  [facility];  Service: Urology;  Laterality: Bilateral;  ? prosatectomy    ? ?Patient Active Problem List  ? Diagnosis Date Noted  ? History of pulmonary embolism 10/10/2021  ?  HTN (hypertension) 10/10/2021  ? Chronic diastolic CHF (congestive heart failure) ([place]) 10/10/2021  ? GERD (gastroesophageal reflux disease) 10/10/2021  ? Ankylosing spondylitis ([place]) 10/10/2021  ? CKD (chronic kidney disease), stage III ([place]) 10/10/2021  ? Hyperlipidemia 10/10/2021  ? CAD (coronary artery disease) 10/10/2021  ? Periprosthetic fracture of shaft of femur 10/10/2021  ? Other fatigue 06/05/2021  ? CAD (coronary artery disease) 04/03/2021  ? Diastolic CHF ([place]) 02/63/7858  ? SOB (shortness of breath) on exertion 03/26/2021  ? Chronic anticoagulation 03/26/2021  ? Anemia in chronic kidney disease 01/25/2021  ? Acquired coagulation disorder ([place]) 12/12/2020  ? Senile purpura ([place])  12/12/2020  ? S/P lumbar fusion 08/03/2020  ? Thin skin 07/25/2020  ? Aortic atherosclerosis ([place]) 05/17/2020  ? B12 deficiency 05/17/2020  ? Iliac artery aneurysm ([place]) 05/17/2020  ? Osteoporosis 04/04/2020  ? Idiopathic neuropathy 01/28/2020  ? Recurrent falls 01/28/2020  ? Chronic kidney disease (CKD), stage III (moderate) ([place]) 01/19/2020  ? Pulmonary emboli ([place]) 01/14/2020  ? GERD (gastroesophageal reflux disease) 12/22/2019  ? Essential tremor 12/22/2019  ? Femoral condyle fracture ([place]) 07/15/2019  ? Gross hematuria 06/14/2019  ? Hyperlipidemia 11/11/2017  ? Kidney stone 11/05/2017  ? Community acquired pneumonia of right upper lobe of lung 11/05/2017  ? Right ureteral stone 11/05/2017  ? History of total right hip replacement 03/08/2017  ? History of prostate cancer 03/08/2017  ? Personal history of prostate cancer 02/28/2017  ? High risk medication use 02/28/2017  ? DDD (degenerative disc disease), thoracic 02/28/2017  ? Ataxia 05/18/2016  ? Vestibular disequilibrium 05/18/2016  ? Ankylosing spondylitis of cervical region [facility]) 05/24/2014  ? Lung nodule, solitary 05/24/2014  ? Lumbosacral stenosis 06/05/2013  ? Peripheral edema 06/08/2012  ? Subcortical microvascular ischemic occlusive disease 08/13/2010  ? Extrinsic asthma 04/07/2009  ? Backache 04/21/2008  ? History of peptic ulcer disease 04/21/2008  ? Essential hypertension 10/29/2007  ?REFERRING PROVIDER: [name], MD ?  ?REFERRING DIAG:  ?M54.50,G89.29 (ICD-10-CM) - Chronic bilateral low back pain without sciatica  ?R26.89 (ICD-10-CM) - Other abnormalities of gait and mobility  ?  ?  ?THERAPY DIAG:  ?Pain in right hip ?  ?Pain in right leg ?  ?Difficulty in walking, not elsewhere classified ?  ?Muscle weakness (generalized) ?  ?ONSET DATE: October 10, 2021 recent fall causing fracture ?  ?SUBJECTIVE:                                                                                                                                                                                           ?  ?SUBJECTIVE STATEMENT: ?I was able to do full squats in the  pool on Wed. I have been walking without an AD at home. Still able to walk around the neighborhood, start off with the cane and aout 3/4 of the way move to walker. The bursitis still knocks me back- I get a shot next Tuesday. I still want to walk more without assistance. Was able to use toilet in a hotel last weekend without assistance. No help required for bed mobility. Went to home to visit mother in law- did not have any issues with the stairs there. Stairs at home are so steep I am not comfortable with them.  ?  ?  ?PERTINENT HISTORY:  ?Stenosis, Rt THA 36 yr ago with h/o periprosthetic fx & revision in 2022, h/o back surgery  ?Hard of hearing; can hear out of Left ear ?  ?PAIN:  ?Are you having pain? not right now ?NPRS scale: 0/10 ?Pain location: Rt hip, groin, into knee ?  ?Aggravating factors: too much activity ?Relieving factors: rest ?  ?PRECAUTIONS: Posterior hip ?  ?WEIGHT BEARING RESTRICTIONS No ?  ?FALLS:  ?Has patient fallen in last 6 months? Yes, Number of falls: 1 ?4/21: no falls recently ?  ?  ?PATIENT GOALS build up strength, get rid of assistance for toileting and getting in/out of bed, no AD in gait, stairs, Sons wedding in [place] first of June ?  ?  ?OBJECTIVE: *All objective findings taken at [facility] unless otherwise noted.  ?  ?PATIENT SURVEYS:  ?LEFS 39 ? ?4/21: 46 ?  ?SCREENING FOR RED FLAGS: ?No significant findings ?  ?COGNITION: ?         Overall cognitive status: Within functional limits for tasks assessed              ?          ?SENSATION: ?         Bil hand/feet neuropathy unchanged since evaluation ?  ?  ? ?  ?  ?LE MMT:  4/21 measured with handheld dynamometry ?  ?MMT Right ?03/06/2022 Right/Left ?04/19/22  ?Hip flexion 4/5 11.1/ 24.3  ?Hip extension     ?Hip abduction     ?Hip adduction     ?Hip internal rotation     ?Hip external rotation     ?Knee flexion 3/5 32.3/37.9  ?Knee  extension 3/5 12.1/42.4  ?Ankle dorsiflexion     ?Ankle plantarflexion     ?Ankle inversion     ?Ankle eversion     ? (Blank rows = not tested) ?  ?  ?  ?FUNCTIONAL TESTS:  ?5 times sit to stand: 38s ?4/21: 18s wit

## 2022-04-19 NOTE — Telephone Encounter (Signed)
Noted  

## 2022-04-19 NOTE — Telephone Encounter (Signed)
Wife dropped off [employer] form for dr hunter to complete - form is in dr hunters folder upfront -  ?

## 2022-04-22 ENCOUNTER — Ambulatory Visit (INDEPENDENT_AMBULATORY_CARE_PROVIDER_SITE_OTHER): Payer: Medicare Other | Admitting: Family

## 2022-04-22 ENCOUNTER — Encounter: Payer: Self-pay | Admitting: Family

## 2022-04-22 VITALS — BP 127/73 | HR 65 | Temp 98.6°F | Ht 71.0 in | Wt 231.1 lb

## 2022-04-22 DIAGNOSIS — J329 Chronic sinusitis, unspecified: Secondary | ICD-10-CM | POA: Diagnosis not present

## 2022-04-22 LAB — POC COVID19 BINAXNOW: SARS Coronavirus 2 Ag: NEGATIVE

## 2022-04-22 NOTE — Progress Notes (Signed)
? ?Subjective:  ? ? ? Patient ID: Bruce Ball, male    DOB: 03-Mar-1946, 76 y.o.   MRN: [id number] ? ?Chief Complaint  ?Patient presents with  ? Sinus Problem  ?  Pt c/o cough, nasal congestion for a couple of weeks. Has tried Claritin which did help last night. Has tried antibiotics and it helped a little but symptoms came right back.   ? Headache  ? ?HPI: ?Cough & Sinus: Patient complains of productive cough with sputum described as yellow.  Symptoms began 2 weeks ago.  The cough is without wheezing, dyspnea or hemoptysis, productive of green/yellow sputum and is aggravated by reclining position Associated symptoms include:change in voice and heartburn.  Patient does not have a history of asthma. Patient does not have a history of environmental allergens. Patient has had recent travel. Patient does have a history of smoking. Patient  has had previous Chest X-ray recently as well as sinus CT scan. He is waiting to see ENT in May.  ? ? ?Past Medical History:  ?Diagnosis Date  ? Ankylosing spondylitis ([place])   ? Arthritis of right knee   ? Basal cell carcinoma   ? Chronic back pain greater than 3 months duration   ? Chronic leg pain   ? CKD (chronic kidney disease), stage II   ? Coronary artery calcification seen on CAT scan 03/27/2021  ? Deafness in right ear   ? Dyspnea   ? cause by PE  ? Essential tremor   ? Extrinsic asthma, unspecified 04/07/2009  ? PT DENIES  ? GERD (gastroesophageal reflux disease)   ? Hearing loss   ? Hip problem   ? History of kidney stones   ? HYPERTENSION 10/29/2007  ? HYPOTENSION 08/13/2010  ? LOW BACK PAIN 04/21/2008  ? Mild aortic stenosis   ? NEPHROLITHIASIS, HX OF 04/21/2008  ? Neuropathy   ? Osteopenia   ? Other hyperlipidemia   ? Peptic ulcer   ? Pneumonia   ? PUD, HX OF 04/21/2008  ? Pulmonary emboli ([place]) 2021  ? noted on CT 12-2019  ? Renal disorder   ? Stage 3 chronic kidney disease ([place])   ? Thyroid nodule   ? ?Past Surgical History:  ?Procedure Laterality Date  ? BACK SURGERY    ?  bone cancer  1970  ? rt femur removed and steel rod placed  ? CYSTOSCOPY WITH RETROGRADE PYELOGRAM, URETEROSCOPY AND STENT PLACEMENT Right 11/06/2017  ? Procedure: CYSTOSCOPY WITH  STENT PLACEMENT RIGHT;  Surgeon: Raynelle Bring, MD;  [facility];  Service: Urology;  Laterality: Right;  ? CYSTOSCOPY WITH RETROGRADE PYELOGRAM, URETEROSCOPY AND STENT PLACEMENT Left 06/09/2019  ? Procedure: CYSTOSCOPY WITH RETROGRADE PYELOGRAM, URETEROSCOPY AND STENT PLACEMENT X2;  Surgeon: [name], MD;  [facility];  Service: Urology;  Laterality: Left;  ? CYSTOSCOPY WITH RETROGRADE PYELOGRAM, URETEROSCOPY AND STENT PLACEMENT Bilateral 03/01/2020  ? Procedure: CYSTOSCOPY WITH RETROGRADE PYELOGRAM, URETEROSCOPY AND STENT PLACEMENT;  Surgeon: [name], MD;  [facility];  Service: Urology;  Laterality: Bilateral;  75 MINS  ? CYSTOSCOPY/RETROGRADE/URETEROSCOPY    ? 1 year ago at [place] in [place]  ? CYSTOSCOPY/URETEROSCOPY/HOLMIUM LASER Right 11/24/2017  ? Procedure: CYSTOSCOPY/URETEROSCOPY/ RETROGRADE/STENT REMOVAL;  Surgeon: Raynelle Bring, MD;  [facility];  Service: Urology;  Laterality: Right;  ? HOLMIUM LASER APPLICATION Bilateral 02/02/5572  ? Procedure: HOLMIUM LASER APPLICATION;  Surgeon: [name], MD;  [facility];  Service: Urology;  Laterality: Bilateral;  ? prosatectomy    ? ? ?  Outpatient Medications Prior to Visit  ?Medication Sig Dispense Refill  ? acetaminophen (TYLENOL) 325 MG tablet Take 2 tablets (650 mg total) by mouth every 6 (six) hours as needed for mild pain or moderate pain.    ? apixaban (ELIQUIS) 2.5 MG TABS tablet Take 1 tablet (2.5 mg total) by mouth 2 (two) times daily.    ? atorvastatin (LIPITOR) 20 MG tablet Take 1 tablet (20 mg total) by mouth at bedtime. 90 tablet 3  ? Calcium Carb-Cholecalciferol 600-10 MG-MCG TABS Take 1 tablet by mouth 2 (two) times daily.    ? carvedilol (COREG) 25 MG tablet Take 12.5 mg by mouth 2 (two) times daily.    ? Cholecalciferol  (VITAMIN D3) 50 MCG (2000 UT) capsule Take 2,000 Units by mouth in the morning and at bedtime.     ? DULoxetine (CYMBALTA) 30 MG capsule Take 30 mg by mouth daily.    ? ferrous sulfate 325 (65 FE) MG tablet Take 325 mg by mouth every Monday, Wednesday, and Friday.    ? fluticasone (FLONASE) 50 MCG/ACT nasal spray Place 1-2 sprays into both nostrils daily as needed for allergies or rhinitis.    ? gabapentin (NEURONTIN) 300 MG capsule Take 300 mg by mouth at bedtime.    ? methocarbamol (ROBAXIN) 500 MG tablet Take 1 tablet by mouth at bedtime as needed for muscle spasms.    ? metoCLOPramide (REGLAN) 5 MG tablet TAKE 1 TABLET (5 MG TOTAL) BY MOUTH 3 (THREE) TIMES DAILY BEFORE MEALS. 90 tablet 1  ? Multiple Vitamins-Minerals (MULTIVITAMIN WITH MINERALS) tablet Take 1 tablet by mouth daily.    ? pantoprazole (PROTONIX) 40 MG tablet Take 40 mg by mouth 2 (two) times daily before a meal.    ? traMADol (ULTRAM) 50 MG tablet Take 50 mg by mouth as needed.    ? ?No facility-administered medications prior to visit.  ? ? ?Allergies  ?Allergen Reactions  ? Erythromycin   ? Lisinopril   ?  Reaction unknown; says lowered blood pressure too much  ? ? ? ?   ?Objective:  ?  ?Physical Exam ?Vitals and nursing note reviewed.  ?Constitutional:   ?   General: He is not in acute distress. ?   Appearance: Normal appearance.  ?HENT:  ?   Head: Normocephalic.  ?   Right Ear: Tympanic membrane and ear canal normal.  ?   Left Ear: Tympanic membrane and ear canal normal.  ?   Nose:  ?   Right Sinus: No frontal sinus tenderness.  ?   Left Sinus: No frontal sinus tenderness.  ?   Mouth/Throat:  ?   Pharynx: Oropharyngeal exudate present. No pharyngeal swelling, posterior oropharyngeal erythema or uvula swelling.  ?Cardiovascular:  ?   Rate and Rhythm: Normal rate and regular rhythm.  ?Pulmonary:  ?   Effort: Pulmonary effort is normal.  ?   Breath sounds: Normal breath sounds.  ?Musculoskeletal:     ?   General: Normal range of motion.  ?    Cervical back: Normal range of motion.  ?Skin: ?   General: Skin is warm and dry.  ?Neurological:  ?   Mental Status: He is alert and oriented to person, place, and time.  ?Psychiatric:     ?   Mood and Affect: Mood normal.  ? ? ?BP 127/73 (BP Location: Left Arm, Patient Position: Sitting, Cuff Size: Large)   Pulse 65   Temp 98.6 ?F (37 ?C) (Temporal)   Ht '5\' 11"'$  (  1.803 m)   Wt 231 lb 2 oz (104.8 kg)   SpO2 97%   BMI 32.24 kg/m?  ?Wt Readings from Last 3 Encounters:  ?04/22/22 231 lb 2 oz (104.8 kg)  ?04/03/22 238 lb (108 kg)  ?03/07/22 233 lb 12.8 oz (106.1 kg)  ? ? ?   ?Assessment & Plan:  ? ?Problem List Items Addressed This Visit   ?None ?Visit Diagnoses   ? ? Chronic sinusitis, unspecified location    -  Primary  ? pt recently traveled on airplane, requesting covid test, rapid test negative. pt using Flonase qam and does not take Claritin daily. Has appt in May with ENT for recurrent infections. Advised he increase the Fonase to bid, use saline nasal spray prior and extra prn, and instructed on proper use, also take the Claritin daily. He can inform ENT if this combination is working or not, but has to use consistently. ? ?Relevant Orders  ? POC COVID-19 (Completed)  ? ?  ? ?Jeanie Sewer, NP ? ?

## 2022-04-22 NOTE — Patient Instructions (Addendum)
It was very nice to see you today! ? ?Your covid test today is negative. ? ?For your sinus symptoms, follow the instructions below: ?Increase your Flonase nasal spray 1 spray each nostril twice a day, 2 sprays twice a day is ok for bad symptoms for 1-2 weeks, then reduce to 1 spray twice a day or daily. ?Use Nasal saline spray (i.e., Simply Saline) or nasal saline lavage (i.e., NeilMed, Neti Pot) is recommended as needed and prior to medicated nasal sprays. ?May use over the counter antihistamines such as Xyzal (levocetirizine), Zyrtec (cetirizine), Claritin (loratadine), or Allegra (fexofenadine) daily as needed. May take twice a day if needed as long as it does not cause drowsiness.  ? ?DRINK at least 2 liters of water daily unless restricted by your Cardiologist. ? ? ?PLEASE NOTE: ? ?If you had any lab tests please let us know if you have not heard back within a few days. You may see your results on MyChart before we have a chance to review them but we will give you a call once they are reviewed by [name]. If we ordered any referrals today, please let us know if you have not heard from their office within the next week.  ? ?

## 2022-04-23 ENCOUNTER — Encounter: Payer: Self-pay | Admitting: Family Medicine

## 2022-04-23 DIAGNOSIS — M978XXA Periprosthetic fracture around other internal prosthetic joint, initial encounter: Secondary | ICD-10-CM | POA: Diagnosis not present

## 2022-04-23 DIAGNOSIS — Z96649 Presence of unspecified artificial hip joint: Secondary | ICD-10-CM | POA: Diagnosis not present

## 2022-04-23 DIAGNOSIS — M7061 Trochanteric bursitis, right hip: Secondary | ICD-10-CM | POA: Diagnosis not present

## 2022-04-24 ENCOUNTER — Ambulatory Visit (HOSPITAL_BASED_OUTPATIENT_CLINIC_OR_DEPARTMENT_OTHER): Payer: Medicare Other | Admitting: Physical Therapy

## 2022-04-24 DIAGNOSIS — R262 Difficulty in walking, not elsewhere classified: Secondary | ICD-10-CM | POA: Diagnosis not present

## 2022-04-24 DIAGNOSIS — M6281 Muscle weakness (generalized): Secondary | ICD-10-CM | POA: Diagnosis not present

## 2022-04-24 DIAGNOSIS — M79604 Pain in right leg: Secondary | ICD-10-CM | POA: Diagnosis not present

## 2022-04-24 DIAGNOSIS — M25551 Pain in right hip: Secondary | ICD-10-CM

## 2022-04-24 NOTE — Telephone Encounter (Signed)
Patient wants to know status of paperwork-  ?

## 2022-04-24 NOTE — Therapy (Signed)
?OUTPATIENT PHYSICAL THERAPY TREATMENT NOTE ? ? ? ? ?Patient Name: Bruce Ball ?MRN: [id number] ?DOB:1946/07/11, 76 y.o., male ?Today's Date: 04/24/2022 ? ?PCP: [name], MD ?REFERRING PROVIDER: [name], MD ? ? PT End of Session - 04/24/22 1155   ? ? Visit Number 11   ? Number of Visits 33   ? Date for PT Re-Evaluation 07/06/22   ? Authorization Type MCR   ? Progress Note Due on Visit 20   ? PT Start Time 1142   ? PT Stop Time 1225   ? PT Time Calculation (min) 43 min   ? Activity Tolerance Patient tolerated treatment well   ? Behavior During Therapy [facility] for tasks assessed/performed   ? ?  ?  ? ?  ? ? ? ?Past Medical History:  ?Diagnosis Date  ? Ankylosing spondylitis ([place])   ? Arthritis of right knee   ? Basal cell carcinoma   ? Chronic back pain greater than 3 months duration   ? Chronic leg pain   ? CKD (chronic kidney disease), stage II   ? Coronary artery calcification seen on CAT scan 03/27/2021  ? Deafness in right ear   ? Dyspnea   ? cause by PE  ? Essential tremor   ? Extrinsic asthma, unspecified 04/07/2009  ? PT DENIES  ? GERD (gastroesophageal reflux disease)   ? Hearing loss   ? Hip problem   ? History of kidney stones   ? HYPERTENSION 10/29/2007  ? HYPOTENSION 08/13/2010  ? LOW BACK PAIN 04/21/2008  ? Mild aortic stenosis   ? NEPHROLITHIASIS, HX OF 04/21/2008  ? Neuropathy   ? Osteopenia   ? Other hyperlipidemia   ? Peptic ulcer   ? Pneumonia   ? PUD, HX OF 04/21/2008  ? Pulmonary emboli ([place]) 2021  ? noted on CT 12-2019  ? Renal disorder   ? Stage 3 chronic kidney disease ([place])   ? Thyroid nodule   ? ?Past Surgical History:  ?Procedure Laterality Date  ? BACK SURGERY    ? bone cancer  1970  ? rt femur removed and steel rod placed  ? CYSTOSCOPY WITH RETROGRADE PYELOGRAM, URETEROSCOPY AND STENT PLACEMENT Right 11/06/2017  ? Procedure: CYSTOSCOPY WITH  STENT PLACEMENT RIGHT;  Surgeon: Raynelle Bring, MD;  Location: [facility];  Service: Urology;  Laterality: Right;  ? CYSTOSCOPY WITH RETROGRADE  PYELOGRAM, URETEROSCOPY AND STENT PLACEMENT Left 06/09/2019  ? Procedure: CYSTOSCOPY WITH RETROGRADE PYELOGRAM, URETEROSCOPY AND STENT PLACEMENT X2;  Surgeon: [name], MD;  Location: [facility];  Service: Urology;  Laterality: Left;  ? CYSTOSCOPY WITH RETROGRADE PYELOGRAM, URETEROSCOPY AND STENT PLACEMENT Bilateral 03/01/2020  ? Procedure: CYSTOSCOPY WITH RETROGRADE PYELOGRAM, URETEROSCOPY AND STENT PLACEMENT;  Surgeon: [name], MD;  Location: [facility];  Service: Urology;  Laterality: Bilateral;  75 MINS  ? CYSTOSCOPY/RETROGRADE/URETEROSCOPY    ? 1 year ago at [place] in [place]  ? CYSTOSCOPY/URETEROSCOPY/HOLMIUM LASER Right 11/24/2017  ? Procedure: CYSTOSCOPY/URETEROSCOPY/ RETROGRADE/STENT REMOVAL;  Surgeon: Raynelle Bring, MD;  Location: [facility];  Service: Urology;  Laterality: Right;  ? HOLMIUM LASER APPLICATION Bilateral 4/0/8144  ? Procedure: HOLMIUM LASER APPLICATION;  Surgeon: [name], MD;  Location: [facility];  Service: Urology;  Laterality: Bilateral;  ? prosatectomy    ? ?Patient Active Problem List  ? Diagnosis Date Noted  ? History of pulmonary embolism 10/10/2021  ? HTN (hypertension) 10/10/2021  ? Chronic diastolic CHF (congestive heart failure) ([place]) 10/10/2021  ? GERD (gastroesophageal reflux disease) 10/10/2021  ?  Ankylosing spondylitis ([place]) 10/10/2021  ? CKD (chronic kidney disease), stage III ([place]) 10/10/2021  ? Hyperlipidemia 10/10/2021  ? CAD (coronary artery disease) 10/10/2021  ? Periprosthetic fracture of shaft of femur 10/10/2021  ? Other fatigue 06/05/2021  ? CAD (coronary artery disease) 04/03/2021  ? Diastolic CHF ([place]) 49/67/5916  ? SOB (shortness of breath) on exertion 03/26/2021  ? Chronic anticoagulation 03/26/2021  ? Anemia in chronic kidney disease 01/25/2021  ? Acquired coagulation disorder ([place]) 12/12/2020  ? Senile purpura ([place]) 12/12/2020  ? S/P lumbar fusion 08/03/2020  ? Thin skin 07/25/2020  ? Aortic atherosclerosis ([place]) 05/17/2020  ? B12 deficiency 05/17/2020  ?  Iliac artery aneurysm ([place]) 05/17/2020  ? Osteoporosis 04/04/2020  ? Idiopathic neuropathy 01/28/2020  ? Recurrent falls 01/28/2020  ? Chronic kidney disease (CKD), stage III (moderate) ([place]) 01/19/2020  ? Pulmonary emboli ([place]) 01/14/2020  ? GERD (gastroesophageal reflux disease) 12/22/2019  ? Essential tremor 12/22/2019  ? Femoral condyle fracture ([place]) 07/15/2019  ? Gross hematuria 06/14/2019  ? Hyperlipidemia 11/11/2017  ? Kidney stone 11/05/2017  ? Community acquired pneumonia of right upper lobe of lung 11/05/2017  ? Right ureteral stone 11/05/2017  ? History of total right hip replacement 03/08/2017  ? History of prostate cancer 03/08/2017  ? Personal history of prostate cancer 02/28/2017  ? High risk medication use 02/28/2017  ? DDD (degenerative disc disease), thoracic 02/28/2017  ? Ataxia 05/18/2016  ? Vestibular disequilibrium 05/18/2016  ? Ankylosing spondylitis of cervical region [facility]) 05/24/2014  ? Lung nodule, solitary 05/24/2014  ? Lumbosacral stenosis 06/05/2013  ? Peripheral edema 06/08/2012  ? Subcortical microvascular ischemic occlusive disease 08/13/2010  ? Extrinsic asthma 04/07/2009  ? Backache 04/21/2008  ? History of peptic ulcer disease 04/21/2008  ? Essential hypertension 10/29/2007  ?REFERRING PROVIDER: [name], MD ?  ?REFERRING DIAG:  ?M54.50,G89.29 (ICD-10-CM) - Chronic bilateral low back pain without sciatica  ?R26.89 (ICD-10-CM) - Other abnormalities of gait and mobility  ?  ?  ?THERAPY DIAG:  ?Pain in right hip ?  ?Pain in right leg ?  ?Difficulty in walking, not elsewhere classified ?  ?Muscle weakness (generalized) ?  ?ONSET DATE: October 10, 2021 recent fall causing fracture ?  ?SUBJECTIVE:                                                                                                                                                                                          ?  ?SUBJECTIVE STATEMENT: ?Pt reports that he continues to work on walking with less assistance.  Does not use cane around house.  He had injection in his Rt hip; went well. He had follow  up with Dr from [facility], "He says I'm 43month ahead of schedule".   ?  ?  ?PERTINENT HISTORY:  ?Stenosis, Rt THA 589yr ago with h/o periprosthetic fx & revision in 2022, h/o back surgery  ?Hard of hearing; can hear out of Left ear ?  ?PAIN:  ?Are you having pain? not right now ?NPRS scale: 0/10 ?Pain location: Rt hip, groin, into knee ?  ?Aggravating factors: too much activity ?Relieving factors: rest ?  ?PRECAUTIONS: Posterior hip ?  ?WEIGHT BEARING RESTRICTIONS No ?  ?FALLS:  ?Has patient fallen in last 6 months? Yes, Number of falls: 1 ?4/21: no falls recently ?  ?  ?PATIENT GOALS build up strength, get rid of assistance for toileting and getting in/out of bed, no AD in gait, stairs, [place] ?  ?  ?[facility]IVE: [facility]  ?  ?PATIENT SURVEYS:  ?LEFS 39 ? ?4/21: 46 ?  ?SCREENING FOR RED FLAGS: ?No significant findings ?  ?COGNITION: ?         Overall cognitive status: Within functional limits for tasks assessed              ?          ?SENSATION: ?         Bil hand/feet neuropathy unchanged since evaluation ?  ?  ? ?  ?  ?LE MMT:  4/21 measured with handheld dynamometry ?  ?MMT Right ?03/06/2022 Right/Left ?04/19/22  ?Hip flexion 4/5 11.1/ 24.3  ?Hip extension     ?Hip abduction     ?Hip adduction     ?Hip internal rotation     ?Hip external rotation     ?Knee flexion 3/5 32.3/37.9  ?Knee extension 3/5 12.1/42.4  ?Ankle dorsiflexion     ?Ankle plantarflexion     ?Ankle inversion     ?Ankle eversion     ? (Blank rows = not tested) ?  ?  ?  ?FUNCTIONAL TESTS:  ?5 times sit to stand: 38s ?4/21: 18s without use of UEs ?Berg Balance Scale: 33/56 ?4/21: 40/50 ?  ?GAIT: ?  ?Comments: SPC in Lt hand, rt leg abducted and externally rotated, antalgic on Rt with pressure placed through cane ?4/21: SPC in Lt hand, Rt leg no longer abd /ER; does demo Rt trunk SB in  Rt stance phase ?  ?  ?  ?TODAY'S TREATMENT  ? ?TODAY'S TREATMENT  ?Pt seen for aquatic therapy today.  Treatment took place in water 3.25-4.8 ft [facility] Temp of water

## 2022-04-24 NOTE — Telephone Encounter (Signed)
Has anyone seen this paperwork? I have gone through what was in [place] folder back here and there are no forms for this pt. ?

## 2022-04-25 NOTE — Telephone Encounter (Signed)
We do not have it upfront, I m sorry! It was placed on dr hunters folder as soon as patient dropped off.- do you think dr hunter may have it on his desk? Thanks!  ?

## 2022-04-26 ENCOUNTER — Ambulatory Visit (HOSPITAL_BASED_OUTPATIENT_CLINIC_OR_DEPARTMENT_OTHER): Payer: Medicare Other | Admitting: Physical Therapy

## 2022-04-26 NOTE — Telephone Encounter (Signed)
I called the pt and made aware It was ready and upfront for pickup. ?

## 2022-04-27 DIAGNOSIS — Z20822 Contact with and (suspected) exposure to covid-19: Secondary | ICD-10-CM | POA: Diagnosis not present

## 2022-04-29 NOTE — Progress Notes (Signed)
? ? ?Chronic Care Management ?Pharmacy Note ? ?Summary: PharmD follow up.  Patient doing well.  Mentions some slight swelling but resolved with Lasix. Monitoring weight.  BP controlled at home.  Just released to walk from PT so plans to increase walking at home now.  No concerns with meds, no changes recommended. ? ? ?04/30/2022 ?Name:  Bruce Ball MRN:  [id number] DOB:  04/09/1946 ? ?Subjective: ?Bruce Ball is an 76 y.o. year old male who is a primary patient of Hunter, Brayton Mars, MD.  The CCM team was consulted for assistance with disease management and care coordination needs.   ? ?Engaged with patient face to face for follow up visit in response to provider referral for pharmacy case management and/or care coordination services.  ? ?Consent to Services:  ?The patient was given the following information about Chronic Care Management services today, agreed to services, and gave verbal consent: 1. CCM service includes personalized support from designated clinical staff supervised by the primary care provider, including individualized plan of care and coordination with other care providers 2. 24/7 contact phone numbers for assistance for urgent and routine care needs. 3. Service will only be billed when office clinical staff spend 20 minutes or more in a month to coordinate care. 4. Only one practitioner may furnish and bill the service in a calendar month. 5.The patient may stop CCM services at any time (effective at the end of the month) by phone call to the office staff. 6. The patient will be responsible for cost sharing (co-pay) of up to 20% of the service fee (after annual deductible is met). Patient agreed to services and consent obtained. ? ?Patient Care Team: ?[name], MD as PCP - General (Family Medicine) ?Sueanne Margarita, MD as PCP - Cardiology (Cardiology) ?[place], [facility] as Consulting Physician ?Jovita Gamma, MD as Consulting Physician (Neurosurgery) ?Cl[facility] as Consulting Physician ?[name], MD as Consulting Physician (Urology) ?Lyndee Hensen, PT as Physical Therapist (Physical Therapy) ?[name], MD (Family Medicine) ?Edythe Clarity, [facility] (Pharmacist) ? ? ?Recent office visits:  ?04/03/2021 OV PCP [name], MD; Patient's family requests having Lasix available if he has weight gain over the weekend or increased swelling-we opted to prescribe and use only if had weight gain of 3 in a day or 5 pounds in a week or if had significant edema or shortness of breath. - Tizanidine for back pain - Referral to PT for chronic gait imbalance. Monitor kidney function at next OV and reconsider nephrology referral as CKD III now.  ?  ?Recent consult visits:  ?n/a ?  ?Hospital visits:  ?Medication Reconciliation was completed by comparing discharge summary, patient?s EMR and Pharmacy list, and upon discussion with patient. ?  ?Admitted to the hospital on 03/26/2021 due to CHF exacerbation. Discharge date was 03/29/2021. Discharged from [facility].   ?  ?New?Medications Started at [facility] Discharge:?? ?-started Potassium and Magnesium supplements due to hypokalemia. ?  ?Medications Discontinued at Hospital Discharge: ?-Stopped Imdur ?  ?Medications that remain the same after Hospital Discharge:??  ?-All other medications will remain the same.   ?Objective: ? ?Lab Results  ?Component Value Date  ? CREATININE 1.35 (H) 12/23/2021  ? CREATININE 1.29 12/04/2021  ? CREATININE 1.25 (H) 10/12/2021  ? ? ?Lab Results  ?Component Value Date  ? HGBA1C 5.5 06/05/2021  ? ?Last diabetic Eye exam: No results found for: HMDIABEYEEXA  ?Last diabetic Foot exam: No results found for:  HMDIABFOOTEX  ? ?   ?Component Value Date/Time  ? CHOL 101 07/11/2021 0000  ? CHOL 103 06/05/2021 0935  ? TRIG 84 07/11/2021 0000  ? HDL 34 (A) 07/11/2021 0000  ? HDL 38 (L) 06/05/2021 0935  ? CHOLHDL 3 04/03/2021 1637  ? VLDL 19.2 04/03/2021 1637  ? [place] 50  07/11/2021 0000  ? [place] 52 06/05/2021 0935  ? LDLDIRECT 145.4 06/27/2011 1124  ? ? ? ?  Latest Ref Rng & Units 12/04/2021  ?  4:08 PM 10/12/2021  ?  7:34 AM 10/11/2021  ?  6:45 AM  ?Hepatic Function  ?Total Protein 6.0 - 8.3 g/dL 6.0    5.4    ?Albumin 3.5 - 5.2 g/dL 3.2   2.9   2.9    ?AST 0 - 37 U/L 15    25    ?ALT 0 - 53 U/L 8    18    ?Alk Phosphatase 39 - 117 U/L 70    74    ?Total Bilirubin 0.2 - 1.2 mg/dL 0.4    0.8    ? ? ?Lab Results  ?Component Value Date/Time  ? TSH 1.806 10/11/2021 03:00 PM  ? TSH 1.930 06/05/2021 09:35 AM  ? TSH 0.825 02/07/2020 03:09 PM  ? ? ? ?  Latest Ref Rng & Units 12/23/2021  ? 11:21 AM 12/04/2021  ?  4:08 PM 10/12/2021  ?  7:34 AM  ?CBC  ?WBC 4.0 - 10.5 K/uL 9.0   6.7   11.1    ?Hemoglobin 13.0 - 17.0 g/dL 12.2   11.0   12.8    ?Hematocrit 39.0 - 52.0 % 40.3   35.7   40.4    ?Platelets 150 - 400 K/uL 322   331.0   208    ? ? ?Lab Results  ?Component Value Date/Time  ? VD25OH 84.5 06/05/2021 09:35 AM  ? VD25OH 45.4 02/07/2020 03:09 PM  ? ? ?Clinical ASCVD:  ?The ASCVD Risk score (Arnett DK, et al., 2019) failed to calculate for the following reasons: ?  The valid total cholesterol range is 130 to 320 mg/dL   ? ?Social History  ? ?Tobacco Use  ?Smoking Status Former  ? Packs/day: 1.00  ? Years: 17.00  ? Pack years: 17.00  ? Types: Cigarettes  ? Start date: 1963  ? Quit date: 12/30/1978  ? Years since quitting: 43.3  ?Smokeless Tobacco Never  ? ?BP Readings from Last 3 Encounters:  ?04/22/22 127/73  ?03/07/22 (!) 146/80  ?01/17/22 124/70  ? ?Pulse Readings from Last 3 Encounters:  ?04/22/22 65  ?03/07/22 (!) 56  ?01/17/22 90  ? ?Wt Readings from Last 3 Encounters:  ?04/22/22 231 lb 2 oz (104.8 kg)  ?04/03/22 238 lb (108 kg)  ?03/07/22 233 lb 12.8 oz (106.1 kg)  ? ?Assessment: Review of patient past medical history, allergies, medications, health status, including review of consultants reports, laboratory and other test data, was performed as part of comprehensive evaluation and  provision of chronic care management services.  ? ?SDOH:  (Social Determinants of Health) assessments and interventions performed: Yes.  ? ?[place] ? ?Allergies  ?Allergen Reactions  ? Erythromycin   ? Lisinopril   ?  Reaction unknown; says lowered blood pressure too much  ? ? ?Medications Reviewed Today   ? ? Reviewed by Edythe Clarity, RPH (Pharmacist) on 04/30/22 at 1417  Med List Status: <None>  ? ?Medication Order Taking? Sig Documenting Provider Last Dose Status Informant  ?acetaminophen (  TYLENOL) 325 MG tablet [id number] Yes Take 2 tablets (650 mg total) by mouth every 6 (six) hours as needed for mild pain or moderate pain. Modena Jansky, MD Taking Active Spouse/Significant Other  ?apixaban (ELIQUIS) 2.5 MG TABS tablet [id number] Yes Take 1 tablet (2.5 mg total) by mouth 2 (two) times daily. [name], MD Taking Active Spouse/Significant Other  ?atorvastatin (LIPITOR) 20 MG tablet [id number] Yes Take 1 tablet (20 mg total) by mouth at bedtime. Sueanne Margarita, MD Taking Active Spouse/Significant Other  ?Calcium Carb-Cholecalciferol 600-10 MG-MCG TABS [id number] Yes Take 1 tablet by mouth 2 (two) times daily. [provider] Taking Active Spouse/Significant Other  ?carvedilol (COREG) 25 MG tablet [id number] Yes Take 12.5 mg by mouth 2 (two) times daily. [provider] Taking Active Spouse/Significant Other  ?Cholecalciferol (VITAMIN D3) 50 MCG (2000 UT) capsule [id number] Yes Take 2,000 Units by mouth in the morning and at bedtime.  [provider] Taking Active Spouse/Significant Other  ?DULoxetine (CYMBALTA) 30 MG capsule [id number] Yes Take 30 mg by mouth daily. [provider] Taking Active Spouse/Significant Other  ?ferrous sulfate 325 (65 FE) MG tablet [id number] Yes Take 325 mg by mouth every Monday, Wednesday, and Friday. [provider] Taking Active Spouse/Significant Other  ?fluticasone (FLONASE) 50 MCG/ACT nasal spray [id number] Yes Place 1-2  sprays into both nostrils daily as needed for allergies or rhinitis. [provider] Taking Active Spouse/Significant Other  ?gabapentin (NEURONTIN) 300 MG capsule [id number] Yes Take 300 mg by mouth at bedtim

## 2022-04-30 ENCOUNTER — Ambulatory Visit (INDEPENDENT_AMBULATORY_CARE_PROVIDER_SITE_OTHER): Payer: Medicare Other | Admitting: Pharmacist

## 2022-04-30 DIAGNOSIS — I10 Essential (primary) hypertension: Secondary | ICD-10-CM

## 2022-04-30 DIAGNOSIS — I5032 Chronic diastolic (congestive) heart failure: Secondary | ICD-10-CM

## 2022-04-30 NOTE — Patient Instructions (Addendum)
Visit Information ? ? Goals Addressed   ? ?  ?  ?  ?  ? This Visit's Progress  ?  Track and Manage Fluids and Swelling-Heart Failure   On track  ?  Timeframe:  Long-Range Goal ?Priority:  High ?Start Date:  04/09/2021                     ?Expected End Date:  04/09/2022                    ? ?Follow Up Date 04/2021  ?  ?- call office if I gain more than 2 pounds in one day or 5 pounds in one week ?- track weight in diary ?- use salt in moderation ?- watch for swelling in feet, ankles and legs every day ?- weigh myself daily  ?  ?Why is this important?   ?It is important to check your weight daily and watch how much salt and liquids you have.  ?It will help you to manage your heart failure.  ?  ?Notes:  ?  ?  Track and Manage My Blood Pressure-Hypertension   On track  ?  Timeframe:  Long-Range Goal ?Priority:  High ?Start Date:  04/09/2021                     ?Expected End Date:  04/09/2022                    ? ?Follow Up Date 04/2021  ?- check blood pressure 3 times per week ?- write blood pressure results in a log or diary  ?  ?Why is this important?   ?You won't feel high blood pressure, but it can still hurt your blood vessels.  ?High blood pressure can cause heart or kidney problems. It can also cause a stroke.  ?Making lifestyle changes like losing a little weight or eating less salt will help.  ?Checking your blood pressure at home and at different times of the day can help to control blood pressure.  ?If the doctor prescribes medicine remember to take it the way the doctor ordered.  ?Call the office if you cannot afford the medicine or if there are questions about it.   ?  ?Notes:  ?  ? ?  ? ?Patient Care Plan: [place]  ?  ? ?Problem Identified: CHF, CAD, HTN, HLD, CKD Stage III, PE Hx, GERD   ?Priority: High  ?Note:   ?CHF, CAD, HTN, HLD, CKD Stage III and PE Hx ?  ? ?Long-Range Goal: Disease Management   ?Start Date: 04/09/2021  ?Expected End Date: 04/09/2022  ?Recent Progress: On track  ?Priority:  High  ?Note:   ?Current Barriers:  ?maintenance of goal daily weights, BP, dietary changes ? ?Pharmacist Clinical Goal(s):  ?Patient will achieve adherence to monitoring guidelines and medication adherence to achieve therapeutic efficacy ?contact provider office for questions/concerns as evidenced notation of same in electronic health record through collaboration with PharmD and provider.  ? ?Interventions: ?1:1 collaboration with [name], MD regarding development and update of comprehensive plan of care as evidenced by provider attestation and co-signature ?Inter-disciplinary care team collaboration (see longitudinal plan of care) ?Comprehensive medication review performed; medication list updated in electronic medical record ?No changes to medications ? ?Hypertension (BP goal <130/80) ?-Controlled ?-CKD III, no DM. GFR per  EHR - 40 (03/2021), VA - 40 (10/2020) ?-Also has essential tremor diagnosis ?-Current treatment: ?Carvedilol  25 mg twice daily ?-Home BP most recently 138/74. 156/68 highest over past week.  ?-Current dietary habits: coffee, oatmeal. After seeing nutritionist, is going to try to eat 3-4 small meals.  Is expecting to have a meal plan sent to him. Has been following salt diet and reading labels ?-Current exercise habits: PT and water aerobics at [facility] ?-Denies hypotensive/hypertensive symptoms ?-Educated on daily weights and keeping a record, home BP monitoring ?-Counseled to monitor BP at home a different times daily, document, and provide log at future appointments ?-Counseled on diet and exercise extensively ?Recommended to continue current medication  ? ?Update 04/30/22 ?Does not check BP much at home.  Has been controlled in office. ?No changes at this time - continue to monitor when possible. ?Encouraged physical activity and water walking.  Has just been released from PT to walk without cane at home. ?Plans to do this to increase exercise and aid in some weight loss. ?No  changes needed. ? ?Hyperlipidemia: (LDL goal < 70) ?-Controlled ?-CAD, HTN, aortic atherosclerosis  ?-Current treatment: ?Atorvastatin 20 mg once daily ?-Educated on Cholesterol goals;  ?Benefits of statin for ASCVD risk reduction; ?-Recommended to continue current medication ? ?CHF (Goal: manage symptoms and prevent exacerbations) ?-Not ideally controlled ?-Has been consistent with daily weights, noted 2lb weight gain at one point which has resolved. Has not needed to use furosemide.  ?-HF type: Diastolic ?-Current treatment: ?Furosemide 20 mg as needed Appropriate, Effective, Safe, Accessible ?Carvedilol '25mg'$  twice daily Appropriate, Effective, Safe, Accessible ?-Current dietary habits: reviewed in htn ?-Current exercise habits: reviewed in htn ?-Educated on Importance of weighing daily; if you gain more than 3 pounds in one day or 5 pounds in one week, take furosemide 20 mg + potassium 10 meq once daily  ?-Recommended to continue current medication ? ?Update 04/30/22 ?Stopped his indapamide.  He is now taking Carvedilol and prn Lasix.  Does report some weight gain but it has been slow and steady not related to fluid.  He takes Lasix occasionally and had to take once last week.  Resolves swelling when he does take it.  Reviewed precautions for when to call providers. ?Denies any shortness of breath associated. ?No changes at this time - encouraged him to get out and continue physical activity whenever possible, he agrees. ? ? ?PE Hx on Eliquis  ?-getting AC through [place] ?-Hx of recurrent falls, ambulation has been ongoing issue. Currently set up to start PT at [facility] ?-PE negative for PE at most recent hospitalization 03/2021. ?-Current Regimen: ?Eliquis 2.5 mg twice daily  ?-Reviewed signs of bleed with patient, no issues noted ?-Recommended to continue current medication  ? ?GERD ?-Improved over past 3-4 months  ?-Hx of peptic ulcer disease ?-In process of prednisone taper 2.5 mg every other day then stop once through  April 2022.  ?-Spicy foods are a trigger ?-Current regimen: ?Pantoprazole 40 mg twice daily ?Pepcid 10 mg - one to two tablets as needed  ?Metoclopramide 5 mg three times daily before meals  ?-May consider deescalation of reflux regimen following completion of prednisone taper. ? ?Patient Goals/Self-Care Activities ?Patient will:  ?- take medications as prescribed ?engage in dietary modifications by following low salt diet and nutrition plan sent by nutritionist ? ?Follow Up Plan: 6 month telephone call - review htn, weights, any medication needs ?Medication Assistance: None required.  Patient affirms current coverage meets needs. ? ?Patient's preferred pharmacy is: ?Most medications ordered through [place] ? ?Follow Up:  Patient agrees to Care Plan  and Follow-up. ?  ? ?  ?  ? ?The patient verbalized understanding of instructions, educational materials, and care plan provided today and declined offer to receive copy of patient instructions, educational materials, and care plan.  ?Telephone follow up appointment with pharmacy team member scheduled for: 6 months ? ?Edythe Clarity, [facility]  ?Beverly Milch, PharmD ?Clinical Pharmacist  ?[place] [facility] ?([phone] ? ?

## 2022-05-01 ENCOUNTER — Encounter (HOSPITAL_BASED_OUTPATIENT_CLINIC_OR_DEPARTMENT_OTHER): Payer: Self-pay | Admitting: Physical Therapy

## 2022-05-01 ENCOUNTER — Ambulatory Visit (HOSPITAL_BASED_OUTPATIENT_CLINIC_OR_DEPARTMENT_OTHER): Payer: Medicare Other | Attending: Family Medicine | Admitting: Physical Therapy

## 2022-05-01 DIAGNOSIS — M79604 Pain in right leg: Secondary | ICD-10-CM | POA: Diagnosis not present

## 2022-05-01 DIAGNOSIS — M6281 Muscle weakness (generalized): Secondary | ICD-10-CM | POA: Insufficient documentation

## 2022-05-01 DIAGNOSIS — M25551 Pain in right hip: Secondary | ICD-10-CM | POA: Insufficient documentation

## 2022-05-01 DIAGNOSIS — R262 Difficulty in walking, not elsewhere classified: Secondary | ICD-10-CM | POA: Diagnosis not present

## 2022-05-01 NOTE — Therapy (Signed)
?OUTPATIENT PHYSICAL THERAPY TREATMENT NOTE ? ? ? ? ?Patient Name: Bruce Ball ?MRN: [id number] ?DOB:[birth year], 76 y.o., male ?Today's Date: 05/01/2022 ? ?PCP: [name], MD ?REFERRING PROVIDER: [name], MD ? ? PT End of Session - 05/01/22 1148   ? ? Visit Number 12   ? Number of Visits 33   ? Date for PT Re-Evaluation 07/06/22   ? Authorization Type MCR   ? Progress Note Due on Visit 20   ? PT Start Time 1145   ? PT Stop Time 1225   ? PT Time Calculation (min) 40 min   ? Activity Tolerance Patient tolerated treatment well   ? Behavior During Therapy [facility] for tasks assessed/performed   ? ?  ?  ? ?  ? ? ? ?Past Medical History:  ?Diagnosis Date  ? Ankylosing spondylitis ([place])   ? Arthritis of right knee   ? Basal cell carcinoma   ? Chronic back pain greater than 3 months duration   ? Chronic leg pain   ? CKD (chronic kidney disease), stage II   ? Coronary artery calcification seen on CAT scan 03/27/2021  ? Deafness in right ear   ? Dyspnea   ? cause by PE  ? Essential tremor   ? Extrinsic asthma, unspecified 04/07/2009  ? PT DENIES  ? GERD (gastroesophageal reflux disease)   ? Hearing loss   ? Hip problem   ? History of kidney stones   ? HYPERTENSION 10/29/2007  ? HYPOTENSION 08/13/2010  ? LOW BACK PAIN 04/21/2008  ? Mild aortic stenosis   ? NEPHROLITHIASIS, HX OF 04/21/2008  ? Neuropathy   ? Osteopenia   ? Other hyperlipidemia   ? Peptic ulcer   ? Pneumonia   ? PUD, HX OF 04/21/2008  ? Pulmonary emboli ([place]) 2021  ? noted on CT 12-2019  ? Renal disorder   ? Stage 3 chronic kidney disease ([place])   ? Thyroid nodule   ? ?Past Surgical History:  ?Procedure Laterality Date  ? BACK SURGERY    ? bone cancer  1970  ? rt femur removed and steel rod placed  ? CYSTOSCOPY WITH RETROGRADE PYELOGRAM, URETEROSCOPY AND STENT PLACEMENT Right 11/06/2017  ? Procedure: CYSTOSCOPY WITH  STENT PLACEMENT RIGHT;  Surgeon: Raynelle Bring, MD;  Location: [facility];  Service: Urology;  Laterality: Right;  ? CYSTOSCOPY WITH RETROGRADE  PYELOGRAM, URETEROSCOPY AND STENT PLACEMENT Left 06/09/2019  ? Procedure: CYSTOSCOPY WITH RETROGRADE PYELOGRAM, URETEROSCOPY AND STENT PLACEMENT X2;  Surgeon: [name], MD;  Location: [facility];  Service: Urology;  Laterality: Left;  ? CYSTOSCOPY WITH RETROGRADE PYELOGRAM, URETEROSCOPY AND STENT PLACEMENT Bilateral 03/01/2020  ? Procedure: CYSTOSCOPY WITH RETROGRADE PYELOGRAM, URETEROSCOPY AND STENT PLACEMENT;  Surgeon: [name], MD;  Location: [facility];  Service: Urology;  Laterality: Bilateral;  75 MINS  ? CYSTOSCOPY/RETROGRADE/URETEROSCOPY    ? 1 year ago at [place] in [place]  ? CYSTOSCOPY/URETEROSCOPY/HOLMIUM LASER Right 11/24/2017  ? Procedure: CYSTOSCOPY/URETEROSCOPY/ RETROGRADE/STENT REMOVAL;  Surgeon: Raynelle Bring, MD;  [facility];  Service: Urology;  Laterality: Right;  ? HOLMIUM LASER APPLICATION Bilateral 02/29/4400  ? Procedure: HOLMIUM LASER APPLICATION;  Surgeon: [name], MD;  Location: [facility];  Service: Urology;  Laterality: Bilateral;  ? prosatectomy    ? ?Patient Active Problem List  ? Diagnosis Date Noted  ? History of pulmonary embolism 10/10/2021  ? HTN (hypertension) 10/10/2021  ? Chronic diastolic CHF (congestive heart failure) ([place]) 10/10/2021  ? GERD (gastroesophageal reflux disease) 10/10/2021  ?  Ankylosing spondylitis ([place]) 10/10/2021  ? CKD (chronic kidney disease), stage III ([place]) 10/10/2021  ? Hyperlipidemia 10/10/2021  ? CAD (coronary artery disease) 10/10/2021  ? Periprosthetic fracture of shaft of femur 10/10/2021  ? Other fatigue 06/05/2021  ? CAD (coronary artery disease) 04/03/2021  ? Diastolic CHF ([place]) 42/59/5638  ? SOB (shortness of breath) on exertion 03/26/2021  ? Chronic anticoagulation 03/26/2021  ? Anemia in chronic kidney disease 01/25/2021  ? Acquired coagulation disorder ([place]) 12/12/2020  ? Senile purpura ([place]) 12/12/2020  ? S/P lumbar fusion 08/03/2020  ? Thin skin 07/25/2020  ? Aortic atherosclerosis ([place]) 05/17/2020  ? B12 deficiency 05/17/2020  ?  Iliac artery aneurysm ([place]) 05/17/2020  ? Osteoporosis 04/04/2020  ? Idiopathic neuropathy 01/28/2020  ? Recurrent falls 01/28/2020  ? Chronic kidney disease (CKD), stage III (moderate) ([place]) 01/19/2020  ? Pulmonary emboli ([place]) 01/14/2020  ? GERD (gastroesophageal reflux disease) 12/22/2019  ? Essential tremor 12/22/2019  ? Femoral condyle fracture ([place]) 07/15/2019  ? Gross hematuria 06/14/2019  ? Hyperlipidemia 11/11/2017  ? Kidney stone 11/05/2017  ? Community acquired pneumonia of right upper lobe of lung 11/05/2017  ? Right ureteral stone 11/05/2017  ? History of total right hip replacement 03/08/2017  ? History of prostate cancer 03/08/2017  ? Personal history of prostate cancer 02/28/2017  ? High risk medication use 02/28/2017  ? DDD (degenerative disc disease), thoracic 02/28/2017  ? Ataxia 05/18/2016  ? Vestibular disequilibrium 05/18/2016  ? Ankylosing spondylitis of cervical region [facility]) 05/24/2014  ? Lung nodule, solitary 05/24/2014  ? Lumbosacral stenosis 06/05/2013  ? Peripheral edema 06/08/2012  ? Subcortical microvascular ischemic occlusive disease 08/13/2010  ? Extrinsic asthma 04/07/2009  ? Backache 04/21/2008  ? History of peptic ulcer disease 04/21/2008  ? Essential hypertension 10/29/2007  ?REFERRING PROVIDER: [name], MD ?  ?REFERRING DIAG:  ?M54.50,G89.29 (ICD-10-CM) - Chronic bilateral low back pain without sciatica  ?R26.89 (ICD-10-CM) - Other abnormalities of gait and mobility  ?  ?  ?THERAPY DIAG:  ?Pain in right hip ?  ?Pain in right leg ?  ?Difficulty in walking, not elsewhere classified ?  ?Muscle weakness (generalized) ?  ?ONSET DATE: October 10, 2021 recent fall causing fracture ?  ?SUBJECTIVE:                                                                                                                                                                                          ?  ?SUBJECTIVE STATEMENT: ?Pt reports that his Rt hip has had increased pain since his injection.    ?  ?  ?PERTINENT HISTORY:  ?Stenosis, Rt THA 48 yr ago with h/o periprosthetic  fx & revision in 2022, h/o back surgery  ?Hard of hearing; can hear out of Left ear ?  ?PAIN:  ?Are you having pain? Yes ?NPRS scale: 5/10 ?Pain location: Rt hip, groin, into knee ?  ?Aggravating factors: too much activity ?Relieving factors: rest ?  ?PRECAUTIONS: Posterior hip ?  ?WEIGHT BEARING RESTRICTIONS No ?  ?FALLS:  ?Has patient fallen in last 6 months? Yes, Number of falls: 1 ?4/21: no falls recently ?  ?  ?PATIENT GOALS build up strength, get rid of assistance for toileting and getting in/out of bed, no AD in gait, stairs, Sons wedding in [place] first of June ?  ?  ?OBJECTIVE: *All objective findings taken at [facility] unless otherwise noted.  ?  ?PATIENT SURVEYS:  ?LEFS 39 ? ?4/21: 46 ?  ?SCREENING FOR RED FLAGS: ?No significant findings ?  ?COGNITION: ?         Overall cognitive status: Within functional limits for tasks assessed              ?          ?SENSATION: ?         Bil hand/feet neuropathy unchanged since evaluation ?  ?  ? ?  ?  ?LE MMT:  4/21 measured with handheld dynamometry ?  ?MMT Right ?03/06/2022 Right/Left ?04/19/22  ?Hip flexion 4/5 11.1/ 24.3  ?Hip extension     ?Hip abduction     ?Hip adduction     ?Hip internal rotation     ?Hip external rotation     ?Knee flexion 3/5 32.3/37.9  ?Knee extension 3/5 12.1/42.4  ?Ankle dorsiflexion     ?Ankle plantarflexion     ?Ankle inversion     ?Ankle eversion     ? (Blank rows = not tested) ?  ?  ?  ?FUNCTIONAL TESTS:  ?5 times sit to stand: 38s ?4/21: 18s without use of UEs ?Berg Balance Scale: 33/56 ?4/21: 40/50 ?  ?GAIT: ?  ?Comments: SPC in Lt hand, rt leg abducted and externally rotated, antalgic on Rt with pressure placed through cane ?4/21: SPC in Lt hand, Rt leg no longer abd /ER; does demo Rt trunk SB in Rt stance phase ?  ?  ?  ?TODAY'S TREATMENT  ? ?TODAY'S TREATMENT  ?Pt seen for aquatic therapy today.  Treatment took place in water 3.25-4.8 ft in depth at the  [facility] pool. Temp of water was 91?.  Pt entered/exited the pool via stairs independently with bilat rail, step-to pattern. ? ?Without UE support:  ?Walking forward, backward with reciprocal

## 2022-05-03 ENCOUNTER — Ambulatory Visit (HOSPITAL_BASED_OUTPATIENT_CLINIC_OR_DEPARTMENT_OTHER): Payer: Medicare Other | Admitting: Physical Therapy

## 2022-05-03 ENCOUNTER — Encounter (HOSPITAL_BASED_OUTPATIENT_CLINIC_OR_DEPARTMENT_OTHER): Payer: Self-pay | Admitting: Physical Therapy

## 2022-05-03 DIAGNOSIS — M79604 Pain in right leg: Secondary | ICD-10-CM

## 2022-05-03 DIAGNOSIS — M6281 Muscle weakness (generalized): Secondary | ICD-10-CM | POA: Diagnosis not present

## 2022-05-03 DIAGNOSIS — R262 Difficulty in walking, not elsewhere classified: Secondary | ICD-10-CM | POA: Diagnosis not present

## 2022-05-03 DIAGNOSIS — M25551 Pain in right hip: Secondary | ICD-10-CM | POA: Diagnosis not present

## 2022-05-03 NOTE — Therapy (Signed)
?OUTPATIENT PHYSICAL THERAPY TREATMENT NOTE ? ? ? ? ?Patient Name: Bruce Ball ?MRN: [id number] ?DOB:09/22/1946, 76 y.o., male ?Today's Date: 05/03/2022 ? ?PCP: [name], MD ?REFERRING PROVIDER: [name], MD ? ? PT End of Session - 05/03/22 1150   ? ? Visit Number 13   ? Number of Visits 33   ? Date for PT Re-Evaluation 07/06/22   ? Authorization Type MCR   ? Progress Note Due on Visit 20   ? PT Start Time 1145   ? PT Stop Time 1228   ? PT Time Calculation (min) 43 min   ? Activity Tolerance Patient tolerated treatment well   ? Behavior During Therapy [facility] for tasks assessed/performed   ? ?  ?  ? ?  ? ? ? ?Past Medical History:  ?Diagnosis Date  ? Ankylosing spondylitis ([place])   ? Arthritis of right knee   ? Basal cell carcinoma   ? Chronic back pain greater than 3 months duration   ? Chronic leg pain   ? CKD (chronic kidney disease), stage II   ? Coronary artery calcification seen on CAT scan 03/27/2021  ? Deafness in right ear   ? Dyspnea   ? cause by PE  ? Essential tremor   ? Extrinsic asthma, unspecified 04/07/2009  ? PT DENIES  ? GERD (gastroesophageal reflux disease)   ? Hearing loss   ? Hip problem   ? History of kidney stones   ? HYPERTENSION 10/29/2007  ? HYPOTENSION 08/13/2010  ? LOW BACK PAIN 04/21/2008  ? Mild aortic stenosis   ? NEPHROLITHIASIS, HX OF 04/21/2008  ? Neuropathy   ? Osteopenia   ? Other hyperlipidemia   ? Peptic ulcer   ? Pneumonia   ? PUD, HX OF 04/21/2008  ? Pulmonary emboli ([place]) 2021  ? noted on CT 12-2019  ? Renal disorder   ? Stage 3 chronic kidney disease ([place])   ? Thyroid nodule   ? ?Past Surgical History:  ?Procedure Laterality Date  ? BACK SURGERY    ? bone cancer  1970  ? rt femur removed and steel rod placed  ? CYSTOSCOPY WITH RETROGRADE PYELOGRAM, URETEROSCOPY AND STENT PLACEMENT Right 11/06/2017  ? Procedure: CYSTOSCOPY WITH  STENT PLACEMENT RIGHT;  Surgeon: Raynelle Bring, MD;  [facility];  Service: Urology;  Laterality: Right;  ? CYSTOSCOPY WITH RETROGRADE  PYELOGRAM, URETEROSCOPY AND STENT PLACEMENT Left 06/09/2019  ? Procedure: CYSTOSCOPY WITH RETROGRADE PYELOGRAM, URETEROSCOPY AND STENT PLACEMENT X2;  Surgeon: [name], MD;  [facility];  Service: Urology;  Laterality: Left;  ? CYSTOSCOPY WITH RETROGRADE PYELOGRAM, URETEROSCOPY AND STENT PLACEMENT Bilateral 03/01/2020  ? Procedure: CYSTOSCOPY WITH RETROGRADE PYELOGRAM, URETEROSCOPY AND STENT PLACEMENT;  Surgeon: [name], MD;  [facility];  Service: Urology;  Laterality: Bilateral;  75 MINS  ? CYSTOSCOPY/RETROGRADE/URETEROSCOPY    ? 1 year ago at [place] in [place]  ? CYSTOSCOPY/URETEROSCOPY/HOLMIUM LASER Right 11/24/2017  ? Procedure: CYSTOSCOPY/URETEROSCOPY/ RETROGRADE/STENT REMOVAL;  Surgeon: Raynelle Bring, MD;  [facility];  Service: Urology;  Laterality: Right;  ? HOLMIUM LASER APPLICATION Bilateral 04/01/8767  ? Procedure: HOLMIUM LASER APPLICATION;  Surgeon: [name], MD;  [facility];  Service: Urology;  Laterality: Bilateral;  ? prosatectomy    ? ?Patient Active Problem List  ? Diagnosis Date Noted  ? History of pulmonary embolism 10/10/2021  ? HTN (hypertension) 10/10/2021  ? Chronic diastolic CHF (congestive heart failure) ([place]) 10/10/2021  ? GERD (gastroesophageal reflux disease) 10/10/2021  ?  Ankylosing spondylitis ([place]) 10/10/2021  ? CKD (chronic kidney disease), stage III ([place]) 10/10/2021  ? Hyperlipidemia 10/10/2021  ? CAD (coronary artery disease) 10/10/2021  ? Periprosthetic fracture of shaft of femur 10/10/2021  ? Other fatigue 06/05/2021  ? CAD (coronary artery disease) 04/03/2021  ? Diastolic CHF ([place]) 99/83/3825  ? SOB (shortness of breath) on exertion 03/26/2021  ? Chronic anticoagulation 03/26/2021  ? Anemia in chronic kidney disease 01/25/2021  ? Acquired coagulation disorder ([place]) 12/12/2020  ? Senile purpura ([place]) 12/12/2020  ? S/P lumbar fusion 08/03/2020  ? Thin skin 07/25/2020  ? Aortic atherosclerosis ([place]) 05/17/2020  ? B12 deficiency 05/17/2020  ?  Iliac artery aneurysm ([place]) 05/17/2020  ? Osteoporosis 04/04/2020  ? Idiopathic neuropathy 01/28/2020  ? Recurrent falls 01/28/2020  ? Chronic kidney disease (CKD), stage III (moderate) ([place]) 01/19/2020  ? Pulmonary emboli ([place]) 01/14/2020  ? GERD (gastroesophageal reflux disease) 12/22/2019  ? Essential tremor 12/22/2019  ? Femoral condyle fracture ([place]) 07/15/2019  ? Gross hematuria 06/14/2019  ? Hyperlipidemia 11/11/2017  ? Kidney stone 11/05/2017  ? Community acquired pneumonia of right upper lobe of lung 11/05/2017  ? Right ureteral stone 11/05/2017  ? History of total right hip replacement 03/08/2017  ? History of prostate cancer 03/08/2017  ? Personal history of prostate cancer 02/28/2017  ? High risk medication use 02/28/2017  ? DDD (degenerative disc disease), thoracic 02/28/2017  ? Ataxia 05/18/2016  ? Vestibular disequilibrium 05/18/2016  ? Ankylosing spondylitis of cervical region [facility]) 05/24/2014  ? Lung nodule, solitary 05/24/2014  ? Lumbosacral stenosis 06/05/2013  ? Peripheral edema 06/08/2012  ? Subcortical microvascular ischemic occlusive disease 08/13/2010  ? Extrinsic asthma 04/07/2009  ? Backache 04/21/2008  ? History of peptic ulcer disease 04/21/2008  ? Essential hypertension 10/29/2007  ?REFERRING PROVIDER: [name], MD ?  ?REFERRING DIAG:  ?M54.50,G89.29 (ICD-10-CM) - Chronic bilateral low back pain without sciatica  ?R26.89 (ICD-10-CM) - Other abnormalities of gait and mobility  ?  ?  ?THERAPY DIAG:  ?Pain in right hip ?  ?Pain in right leg ?  ?Difficulty in walking, not elsewhere classified ?  ?Muscle weakness (generalized) ?  ?ONSET DATE: October 10, 2021 recent fall causing fracture ?  ?SUBJECTIVE:                                                                                                                                                                                          ?  ?SUBJECTIVE STATEMENT: ?Pt reports that he does water walking for 1 hour 5 days per week; has  already walked in water this morning and plans to walk in neighborhood this afternoon.  Pt  reports that his Rt hip "feels worse" since his injection.   ?  ?  ?PERTINENT HISTORY:  ?Stenosis, Rt THA 60 yr ago with h/o periprosthetic fx & revision in 2022, h/o back surgery  ?Hard of hearing; can hear out of Left ear ?  ?PAIN:  ?Are you having pain? Yes ?NPRS scale: 3/10 ?Pain location: Rt knee  ?  ?Aggravating factors: too much activity ?Relieving factors: rest ?  ?PRECAUTIONS: Posterior hip ?  ?WEIGHT BEARING RESTRICTIONS No ?  ?FALLS:  ?Has patient fallen in last 6 months? Yes, Number of falls: 1 ?4/21: no falls recently ?  ?  ?PATIENT GOALS build up strength, get rid of assistance for toileting and getting in/out of bed, no AD in gait, stairs, Son's wedding in [place] first of June ?  ?  ?OBJECTIVE: *All objective findings taken at [facility] unless otherwise noted.  ?  ?PATIENT SURVEYS:  ?LEFS 39 ? ?4/21: 46 ?  ?SCREENING FOR RED FLAGS: ?No significant findings ?  ?COGNITION: ?         Overall cognitive status: Within functional limits for tasks assessed              ?          ?SENSATION: ?         Bil hand/feet neuropathy unchanged since evaluation ?  ?  ? ?  ?  ?LE MMT:  4/21 measured with handheld dynamometry ?  ?MMT Right ?03/06/2022 Right/Left ?04/19/22  ?Hip flexion 4/5 11.1/ 24.3  ?Hip extension     ?Hip abduction     ?Hip adduction     ?Hip internal rotation     ?Hip external rotation     ?Knee flexion 3/5 32.3/37.9  ?Knee extension 3/5 12.1/42.4  ?Ankle dorsiflexion     ?Ankle plantarflexion     ?Ankle inversion     ?Ankle eversion     ? (Blank rows = not tested) ?  ?  ?  ?FUNCTIONAL TESTS:  ?5 times sit to stand: 38s ?4/21: 18s without use of UEs ?Berg Balance Scale: 33/56 ?4/21: 40/50 ?  ?GAIT: ?  ?Comments: SPC in Lt hand, rt leg abducted and externally rotated, antalgic on Rt with pressure placed through cane ?4/21: SPC in Lt hand, Rt leg no longer abd /ER; does demo Rt trunk SB in Rt stance phase ?  ?  ?   ?TODAY'S TREATMENT  ? ?TODAY'S TREATMENT  ?Pt seen for aquatic therapy today.  Treatment took place in water 3.25-4.8 ft in depth at the [facility] pool. Temp of water was 91?.  Pt entered/exited

## 2022-05-07 DIAGNOSIS — Z20822 Contact with and (suspected) exposure to covid-19: Secondary | ICD-10-CM | POA: Diagnosis not present

## 2022-05-07 DIAGNOSIS — I2699 Other pulmonary embolism without acute cor pulmonale: Secondary | ICD-10-CM | POA: Diagnosis not present

## 2022-05-08 ENCOUNTER — Ambulatory Visit (HOSPITAL_BASED_OUTPATIENT_CLINIC_OR_DEPARTMENT_OTHER): Payer: Medicare Other | Admitting: Physical Therapy

## 2022-05-08 ENCOUNTER — Encounter (HOSPITAL_BASED_OUTPATIENT_CLINIC_OR_DEPARTMENT_OTHER): Payer: Self-pay | Admitting: Physical Therapy

## 2022-05-08 DIAGNOSIS — M25551 Pain in right hip: Secondary | ICD-10-CM | POA: Diagnosis not present

## 2022-05-08 DIAGNOSIS — R262 Difficulty in walking, not elsewhere classified: Secondary | ICD-10-CM

## 2022-05-08 DIAGNOSIS — M6281 Muscle weakness (generalized): Secondary | ICD-10-CM | POA: Diagnosis not present

## 2022-05-08 DIAGNOSIS — M79604 Pain in right leg: Secondary | ICD-10-CM

## 2022-05-08 NOTE — Therapy (Signed)
?OUTPATIENT PHYSICAL THERAPY TREATMENT NOTE ? ? ? ? ?Patient Name: Bruce Ball ?MRN: [id number] ?DOB:Aug 10, 1946, 76 y.o., male ?Today's Date: 05/08/2022 ? ?PCP: [name], MD ?REFERRING PROVIDER: [name], MD ? ? PT End of Session - 05/08/22 1141   ? ? Visit Number 14   ? Number of Visits 33   ? Date for PT Re-Evaluation 07/06/22   ? Authorization Type MCR   ? Progress Note Due on Visit 20   ? PT Start Time 1138   ? PT Stop Time 1223   ? PT Time Calculation (min) 45 min   ? Activity Tolerance Patient tolerated treatment well   ? Behavior During Therapy [facility] for tasks assessed/performed   ? ?  ?  ? ?  ? ? ? ?Past Medical History:  ?Diagnosis Date  ? Ankylosing spondylitis ([place])   ? Arthritis of right knee   ? Basal cell carcinoma   ? Chronic back pain greater than 3 months duration   ? Chronic leg pain   ? CKD (chronic kidney disease), stage II   ? Coronary artery calcification seen on CAT scan 03/27/2021  ? Deafness in right ear   ? Dyspnea   ? cause by PE  ? Essential tremor   ? Extrinsic asthma, unspecified 04/07/2009  ? PT DENIES  ? GERD (gastroesophageal reflux disease)   ? Hearing loss   ? Hip problem   ? History of kidney stones   ? HYPERTENSION 10/29/2007  ? HYPOTENSION 08/13/2010  ? LOW BACK PAIN 04/21/2008  ? Mild aortic stenosis   ? NEPHROLITHIASIS, HX OF 04/21/2008  ? Neuropathy   ? Osteopenia   ? Other hyperlipidemia   ? Peptic ulcer   ? Pneumonia   ? PUD, HX OF 04/21/2008  ? Pulmonary emboli ([place]) 2021  ? noted on CT 12-2019  ? Renal disorder   ? Stage 3 chronic kidney disease ([place])   ? Thyroid nodule   ? ?Past Surgical History:  ?Procedure Laterality Date  ? BACK SURGERY    ? bone cancer  1970  ? rt femur removed and steel rod placed  ? CYSTOSCOPY WITH RETROGRADE PYELOGRAM, URETEROSCOPY AND STENT PLACEMENT Right 11/06/2017  ? Procedure: CYSTOSCOPY WITH  STENT PLACEMENT RIGHT;  Surgeon: Raynelle Bring, MD;  Location: [facility];  Service: Urology;  Laterality: Right;  ? CYSTOSCOPY WITH RETROGRADE  PYELOGRAM, URETEROSCOPY AND STENT PLACEMENT Left 06/09/2019  ? Procedure: CYSTOSCOPY WITH RETROGRADE PYELOGRAM, URETEROSCOPY AND STENT PLACEMENT X2;  Surgeon: [name], MD;  [facility];  Service: Urology;  Laterality: Left;  ? CYSTOSCOPY WITH RETROGRADE PYELOGRAM, URETEROSCOPY AND STENT PLACEMENT Bilateral 03/01/2020  ? Procedure: CYSTOSCOPY WITH RETROGRADE PYELOGRAM, URETEROSCOPY AND STENT PLACEMENT;  Surgeon: [name], MD;  [facility];  Service: Urology;  Laterality: Bilateral;  75 MINS  ? CYSTOSCOPY/RETROGRADE/URETEROSCOPY    ? 1 year ago at [place] in [place]  ? CYSTOSCOPY/URETEROSCOPY/HOLMIUM LASER Right 11/24/2017  ? Procedure: CYSTOSCOPY/URETEROSCOPY/ RETROGRADE/STENT REMOVAL;  Surgeon: Raynelle Bring, MD;  [facility];  Service: Urology;  Laterality: Right;  ? HOLMIUM LASER APPLICATION Bilateral 06/07/4853  ? Procedure: HOLMIUM LASER APPLICATION;  Surgeon: [name], MD;  [facility];  Service: Urology;  Laterality: Bilateral;  ? prosatectomy    ? ?Patient Active Problem List  ? Diagnosis Date Noted  ? History of pulmonary embolism 10/10/2021  ? HTN (hypertension) 10/10/2021  ? Chronic diastolic CHF (congestive heart failure) ([place]) 10/10/2021  ? GERD (gastroesophageal reflux disease) 10/10/2021  ?  Ankylosing spondylitis ([place]) 10/10/2021  ? CKD (chronic kidney disease), stage III ([place]) 10/10/2021  ? Hyperlipidemia 10/10/2021  ? CAD (coronary artery disease) 10/10/2021  ? Periprosthetic fracture of shaft of femur 10/10/2021  ? Other fatigue 06/05/2021  ? CAD (coronary artery disease) 04/03/2021  ? Diastolic CHF ([place]) 94/49/6759  ? SOB (shortness of breath) on exertion 03/26/2021  ? Chronic anticoagulation 03/26/2021  ? Anemia in chronic kidney disease 01/25/2021  ? Acquired coagulation disorder ([place]) 12/12/2020  ? Senile purpura ([place]) 12/12/2020  ? S/P lumbar fusion 08/03/2020  ? Thin skin 07/25/2020  ? Aortic atherosclerosis ([place]) 05/17/2020  ? B12 deficiency 05/17/2020  ?  Iliac artery aneurysm ([place]) 05/17/2020  ? Osteoporosis 04/04/2020  ? Idiopathic neuropathy 01/28/2020  ? Recurrent falls 01/28/2020  ? Chronic kidney disease (CKD), stage III (moderate) ([place]) 01/19/2020  ? Pulmonary emboli ([place]) 01/14/2020  ? GERD (gastroesophageal reflux disease) 12/22/2019  ? Essential tremor 12/22/2019  ? Femoral condyle fracture ([place]) 07/15/2019  ? Gross hematuria 06/14/2019  ? Hyperlipidemia 11/11/2017  ? Kidney stone 11/05/2017  ? Community acquired pneumonia of right upper lobe of lung 11/05/2017  ? Right ureteral stone 11/05/2017  ? History of total right hip replacement 03/08/2017  ? History of prostate cancer 03/08/2017  ? Personal history of prostate cancer 02/28/2017  ? High risk medication use 02/28/2017  ? DDD (degenerative disc disease), thoracic 02/28/2017  ? Ataxia 05/18/2016  ? Vestibular disequilibrium 05/18/2016  ? Ankylosing spondylitis of cervical region [facility]) 05/24/2014  ? Lung nodule, solitary 05/24/2014  ? Lumbosacral stenosis 06/05/2013  ? Peripheral edema 06/08/2012  ? Subcortical microvascular ischemic occlusive disease 08/13/2010  ? Extrinsic asthma 04/07/2009  ? Backache 04/21/2008  ? History of peptic ulcer disease 04/21/2008  ? Essential hypertension 10/29/2007  ?REFERRING PROVIDER: [name], MD ?  ?REFERRING DIAG:  ?M54.50,G89.29 (ICD-10-CM) - Chronic bilateral low back pain without sciatica  ?R26.89 (ICD-10-CM) - Other abnormalities of gait and mobility  ?  ?  ?THERAPY DIAG:  ?Pain in right hip ?  ?Pain in right leg ?  ?Difficulty in walking, not elsewhere classified ?  ?Muscle weakness (generalized) ?  ?ONSET DATE: October 10, 2021 recent fall causing fracture ?  ?SUBJECTIVE:                                                                                                                                                                                          ?  ?SUBJECTIVE STATEMENT: ? Pt reports that his Rt hip "feels worse" since his injection.  He  reports he is walking less since his injection due to elevated pain.  He took tylenol prior  to session. He thinks doing the stairs in pool has been helpful.  ?  ?  ?PERTINENT HISTORY:  ?Stenosis, Rt THA 33 yr ago with h/o periprosthetic fx & revision in 2022, h/o back surgery  ?Hard of hearing; can hear out of Left ear ?  ?PAIN:  ?Are you having pain? Yes ?NPRS scale: 2/10 ?Pain location: Rt ant hip/ groin ?  ?Aggravating factors: too much activity ?Relieving factors: rest ?  ?PRECAUTIONS: Posterior hip ?  ?WEIGHT BEARING RESTRICTIONS No ?  ?FALLS:  ?Has patient fallen in last 6 months? Yes, Number of falls: 1 ?4/21: no falls recently ?  ?  ?PATIENT GOALS build up strength, get rid of assistance for toileting and getting in/out of bed, no AD in gait, stairs, Son's wedding in [place] first of June ?  ?  ?OBJECTIVE: *All objective findings [facility] unless otherwise noted.  ?  ?PATIENT SURVEYS:  ?LEFS 39 ? ?4/21: 46 ?  ?SCREENING FOR RED FLAGS: ?No significant findings ?  ?COGNITION: ?         Overall cognitive status: Within functional limits for tasks assessed              ?          ?SENSATION: ?         Bil hand/feet neuropathy unchanged since evaluation ?  ?  ? ?  ?  ?LE MMT:  4/21 measured with handheld dynamometry ?  ?MMT Right ?03/06/2022 Right/Left ?04/19/22  ?Hip flexion 4/5 11.1/ 24.3  ?Hip extension     ?Hip abduction     ?Hip adduction     ?Hip internal rotation     ?Hip external rotation     ?Knee flexion 3/5 32.3/37.9  ?Knee extension 3/5 12.1/42.4  ?Ankle dorsiflexion     ?Ankle plantarflexion     ?Ankle inversion     ?Ankle eversion     ? (Blank rows = not tested) ?  ?  ?  ?FUNCTIONAL TESTS:  ?5 times sit to stand: 38s ?4/21: 18s without use of UEs ?Berg Balance Scale: 33/56 ?4/21: 40/50 ?  ?GAIT: ?  ?Comments: SPC in Lt hand, rt leg abducted and externally rotated, antalgic on Rt with pressure placed through cane ?4/21: SPC in Lt hand, Rt leg no longer abd /ER; does demo Rt trunk SB in Rt stance  phase ?  ?  ?  ?TODAY'S TREATMENT  ? ?TODAY'S TREATMENT  ?Pt seen for aquatic therapy today.  Treatment took place in water 3.25-4.8 ft in depth at the [employer] pool. Temp of water was 91?.  Pt en

## 2022-05-09 DIAGNOSIS — L98499 Non-pressure chronic ulcer of skin of other sites with unspecified severity: Secondary | ICD-10-CM | POA: Diagnosis not present

## 2022-05-09 DIAGNOSIS — Z85828 Personal history of other malignant neoplasm of skin: Secondary | ICD-10-CM | POA: Diagnosis not present

## 2022-05-09 DIAGNOSIS — L97121 Non-pressure chronic ulcer of left thigh limited to breakdown of skin: Secondary | ICD-10-CM | POA: Diagnosis not present

## 2022-05-10 ENCOUNTER — Ambulatory Visit (HOSPITAL_BASED_OUTPATIENT_CLINIC_OR_DEPARTMENT_OTHER): Payer: Medicare Other | Admitting: Physical Therapy

## 2022-05-10 ENCOUNTER — Encounter (HOSPITAL_BASED_OUTPATIENT_CLINIC_OR_DEPARTMENT_OTHER): Payer: Self-pay | Admitting: Physical Therapy

## 2022-05-10 DIAGNOSIS — M79604 Pain in right leg: Secondary | ICD-10-CM | POA: Diagnosis not present

## 2022-05-10 DIAGNOSIS — M25551 Pain in right hip: Secondary | ICD-10-CM

## 2022-05-10 DIAGNOSIS — M6281 Muscle weakness (generalized): Secondary | ICD-10-CM | POA: Diagnosis not present

## 2022-05-10 DIAGNOSIS — R262 Difficulty in walking, not elsewhere classified: Secondary | ICD-10-CM | POA: Diagnosis not present

## 2022-05-10 NOTE — Therapy (Signed)
?OUTPATIENT PHYSICAL THERAPY TREATMENT NOTE ? ? ? ? ?Patient Name: Bruce Ball ?MRN: [id number] ?DOB:[birth year], 76 y.o., male ?Today's Date: 05/10/2022 ? ?PCP: [name], MD ?REFERRING PROVIDER: [name], MD ? ? PT End of Session - 05/10/22 1118   ? ? Visit Number 15   ? Number of Visits 33   ? Date for PT Re-Evaluation 07/06/22   ? Authorization Type MCR   ? Progress Note Due on Visit 20   ? PT Start Time 1100   ? PT Stop Time 1140   ? PT Time Calculation (min) 40 min   ? Activity Tolerance Patient tolerated treatment well   ? Behavior During Therapy [facility] for tasks assessed/performed   ? ?  ?  ? ?  ? ? ? ?Past Medical History:  ?Diagnosis Date  ? Ankylosing spondylitis ([place])   ? Arthritis of right knee   ? Basal cell carcinoma   ? Chronic back pain greater than 3 months duration   ? Chronic leg pain   ? CKD (chronic kidney disease), stage II   ? Coronary artery calcification seen on CAT scan 03/27/2021  ? Deafness in right ear   ? Dyspnea   ? cause by PE  ? Essential tremor   ? Extrinsic asthma, unspecified 04/07/2009  ? PT DENIES  ? GERD (gastroesophageal reflux disease)   ? Hearing loss   ? Hip problem   ? History of kidney stones   ? HYPERTENSION 10/29/2007  ? HYPOTENSION 08/13/2010  ? LOW BACK PAIN 04/21/2008  ? Mild aortic stenosis   ? NEPHROLITHIASIS, HX OF 04/21/2008  ? Neuropathy   ? Osteopenia   ? Other hyperlipidemia   ? Peptic ulcer   ? Pneumonia   ? PUD, HX OF 04/21/2008  ? Pulmonary emboli ([place]) 2021  ? noted on CT 12-2019  ? Renal disorder   ? Stage 3 chronic kidney disease ([place])   ? Thyroid nodule   ? ?Past Surgical History:  ?Procedure Laterality Date  ? BACK SURGERY    ? bone cancer  1970  ? rt femur removed and steel rod placed  ? CYSTOSCOPY WITH RETROGRADE PYELOGRAM, URETEROSCOPY AND STENT PLACEMENT Right 11/06/2017  ? Procedure: CYSTOSCOPY WITH  STENT PLACEMENT RIGHT;  Surgeon: Raynelle Bring, MD;  [facility];  Service: Urology;  Laterality: Right;  ? CYSTOSCOPY WITH RETROGRADE  PYELOGRAM, URETEROSCOPY AND STENT PLACEMENT Left 06/09/2019  ? Procedure: CYSTOSCOPY WITH RETROGRADE PYELOGRAM, URETEROSCOPY AND STENT PLACEMENT X2;  Surgeon: [name], MD;  [facility];  Service: Urology;  Laterality: Left;  ? CYSTOSCOPY WITH RETROGRADE PYELOGRAM, URETEROSCOPY AND STENT PLACEMENT Bilateral 03/01/2020  ? Procedure: CYSTOSCOPY WITH RETROGRADE PYELOGRAM, URETEROSCOPY AND STENT PLACEMENT;  Surgeon: [name], MD;  [facility];  Service: Urology;  Laterality: Bilateral;  75 MINS  ? CYSTOSCOPY/RETROGRADE/URETEROSCOPY    ? 1 year ago at [place] in [place]  ? CYSTOSCOPY/URETEROSCOPY/HOLMIUM LASER Right 11/24/2017  ? Procedure: CYSTOSCOPY/URETEROSCOPY/ RETROGRADE/STENT REMOVAL;  Surgeon: Raynelle Bring, MD;  [facility];  Service: Urology;  Laterality: Right;  ? HOLMIUM LASER APPLICATION Bilateral 03/31/3243  ? Procedure: HOLMIUM LASER APPLICATION;  Surgeon: [name], MD;  [facility];  Service: Urology;  Laterality: Bilateral;  ? prosatectomy    ? ?Patient Active Problem List  ? Diagnosis Date Noted  ? History of pulmonary embolism 10/10/2021  ? HTN (hypertension) 10/10/2021  ? Chronic diastolic CHF (congestive heart failure) ([place]) 10/10/2021  ? GERD (gastroesophageal reflux disease) 10/10/2021  ?  Ankylosing spondylitis ([place]) 10/10/2021  ? CKD (chronic kidney disease), stage III ([place]) 10/10/2021  ? Hyperlipidemia 10/10/2021  ? CAD (coronary artery disease) 10/10/2021  ? Periprosthetic fracture of shaft of femur 10/10/2021  ? Other fatigue 06/05/2021  ? CAD (coronary artery disease) 04/03/2021  ? Diastolic CHF ([place]) 62/70/3500  ? SOB (shortness of breath) on exertion 03/26/2021  ? Chronic anticoagulation 03/26/2021  ? Anemia in chronic kidney disease 01/25/2021  ? Acquired coagulation disorder ([place]) 12/12/2020  ? Senile purpura ([place]) 12/12/2020  ? S/P lumbar fusion 08/03/2020  ? Thin skin 07/25/2020  ? Aortic atherosclerosis ([place]) 05/17/2020  ? B12 deficiency 05/17/2020  ?  Iliac artery aneurysm ([place]) 05/17/2020  ? Osteoporosis 04/04/2020  ? Idiopathic neuropathy 01/28/2020  ? Recurrent falls 01/28/2020  ? Chronic kidney disease (CKD), stage III (moderate) ([place]) 01/19/2020  ? Pulmonary emboli ([place]) 01/14/2020  ? GERD (gastroesophageal reflux disease) 12/22/2019  ? Essential tremor 12/22/2019  ? Femoral condyle fracture ([place]) 07/15/2019  ? Gross hematuria 06/14/2019  ? Hyperlipidemia 11/11/2017  ? Kidney stone 11/05/2017  ? Community acquired pneumonia of right upper lobe of lung 11/05/2017  ? Right ureteral stone 11/05/2017  ? History of total right hip replacement 03/08/2017  ? History of prostate cancer 03/08/2017  ? Personal history of prostate cancer 02/28/2017  ? High risk medication use 02/28/2017  ? DDD (degenerative disc disease), thoracic 02/28/2017  ? Ataxia 05/18/2016  ? Vestibular disequilibrium 05/18/2016  ? Ankylosing spondylitis of cervical region [facility]) 05/24/2014  ? Lung nodule, solitary 05/24/2014  ? Lumbosacral stenosis 06/05/2013  ? Peripheral edema 06/08/2012  ? Subcortical microvascular ischemic occlusive disease 08/13/2010  ? Extrinsic asthma 04/07/2009  ? Backache 04/21/2008  ? History of peptic ulcer disease 04/21/2008  ? Essential hypertension 10/29/2007  ?REFERRING PROVIDER: [name], MD ?  ?REFERRING DIAG:  ?M54.50,G89.29 (ICD-10-CM) - Chronic bilateral low back pain without sciatica  ?R26.89 (ICD-10-CM) - Other abnormalities of gait and mobility  ?  ?  ?THERAPY DIAG:  ?Pain in right hip ?  ?Pain in right leg ?  ?Difficulty in walking, not elsewhere classified ?  ?Muscle weakness (generalized) ?  ?ONSET DATE: October 10, 2021 recent fall causing fracture ?  ?SUBJECTIVE:                                                                                                                                                                                          ?  ?SUBJECTIVE STATEMENT: ?Pt reports his pain is a little more today. He usually rests over the  weekend, but pain doesn't change much with rest.  He already performed water walking this morning.   ?  ?  ?  PERTINENT HISTORY:  ?Stenosis, Rt THA 106 yr ago with h/o periprosthetic fx & revision in 2022, h/o back surgery  ?Hard of hearing; can hear out of Left ear ?  ?PAIN:  ?Are you having pain? Yes ?NPRS scale: 6/10 ?Pain location: Rt ant hip/ groin to knee  ?  ?Aggravating factors: too much activity ?Relieving factors: rest ?  ?PRECAUTIONS: Posterior hip ?  ?WEIGHT BEARING RESTRICTIONS No ?  ?FALLS:  ?Has patient fallen in last 6 months? Yes, Number of falls: 1 ?4/21: no falls recently ?  ?  ?PATIENT GOALS build up strength, get rid of assistance for toileting and getting in/out of bed, no AD in gait, stairs, Son's wedding in [place] first of June ?  ?  ?OBJECTIVE: *All objective findings taken at [facility] unless otherwise noted.  ?  ?PATIENT SURVEYS:  ?LEFS 39 ? ?4/21: 46 ?  ?SCREENING FOR RED FLAGS: ?No significant findings ?  ?COGNITION: ?         Overall cognitive status: Within functional limits for tasks assessed              ?          ?SENSATION: ?         Bil hand/feet neuropathy unchanged since evaluation ?  ?  ? ?  ?  ?LE MMT:  4/21 measured with handheld dynamometry ?  ?MMT Right ?03/06/2022 Right/Left ?04/19/22  ?Hip flexion 4/5 11.1/ 24.3  ?Hip extension     ?Hip abduction     ?Hip adduction     ?Hip internal rotation     ?Hip external rotation     ?Knee flexion 3/5 32.3/37.9  ?Knee extension 3/5 12.1/42.4  ?Ankle dorsiflexion     ?Ankle plantarflexion     ?Ankle inversion     ?Ankle eversion     ? (Blank rows = not tested) ?  ?  ?  ?FUNCTIONAL TESTS:  ?5 times sit to stand: 38s ?4/21: 18s without use of UEs ?Berg Balance Scale: 33/56 ?4/21: 40/50 ?  ?GAIT: ?  ?Comments: SPC in Lt hand, rt leg abducted and externally rotated, antalgic on Rt with pressure placed through cane ?4/21: SPC in Lt hand, Rt leg no longer abd /ER; does demo Rt trunk SB in Rt stance phase ?  ?  ?  ?TODAY'S TREATMENT  ? ?TODAY'S  TREATMENT  ?Pt seen for aquatic therapy today.  Treatment took place in water 3.25-4.8 ft in depth at the [employer] [facility] pool. Temp of water was 90?Marland Kitchen  Pt entered/exited the pool via stairs independentl

## 2022-05-15 ENCOUNTER — Ambulatory Visit (HOSPITAL_BASED_OUTPATIENT_CLINIC_OR_DEPARTMENT_OTHER): Payer: Medicare Other | Admitting: Physical Therapy

## 2022-05-17 ENCOUNTER — Ambulatory Visit (HOSPITAL_BASED_OUTPATIENT_CLINIC_OR_DEPARTMENT_OTHER): Payer: Medicare Other | Admitting: Physical Therapy

## 2022-05-17 ENCOUNTER — Encounter (HOSPITAL_BASED_OUTPATIENT_CLINIC_OR_DEPARTMENT_OTHER): Payer: Self-pay | Admitting: Physical Therapy

## 2022-05-17 DIAGNOSIS — M6281 Muscle weakness (generalized): Secondary | ICD-10-CM

## 2022-05-17 DIAGNOSIS — M79604 Pain in right leg: Secondary | ICD-10-CM

## 2022-05-17 DIAGNOSIS — R262 Difficulty in walking, not elsewhere classified: Secondary | ICD-10-CM | POA: Diagnosis not present

## 2022-05-17 DIAGNOSIS — M25551 Pain in right hip: Secondary | ICD-10-CM | POA: Diagnosis not present

## 2022-05-17 NOTE — Therapy (Signed)
OUTPATIENT PHYSICAL THERAPY TREATMENT NOTE     Patient Name: Bruce Ball MRN: [id number] DOB:Sep 17, 1946, 76 y.o., male Today's Date: 05/17/2022  PCP: [name], MD REFERRING PROVIDER: [name], MD   PT End of Session - 05/17/22 1101     Visit Number 16    Number of Visits 31    Date for PT Re-Evaluation 07/06/22    Authorization Type MCR    Progress Note Due on Visit 20    PT Start Time 1100    PT Stop Time 6384    PT Time Calculation (min) 45 min    Activity Tolerance Patient tolerated treatment well    Behavior During Therapy WFL for tasks assessed/performed              Past Medical History:  Diagnosis Date   Ankylosing spondylitis ([place])    Arthritis of right knee    Basal cell carcinoma    Chronic back pain greater than 3 months duration    Chronic leg pain    CKD (chronic kidney disease), stage II    Coronary artery calcification seen on CAT scan 03/27/2021   Deafness in right ear    Dyspnea    cause by PE   Essential tremor    Extrinsic asthma, unspecified 04/07/2009   PT DENIES   GERD (gastroesophageal reflux disease)    Hearing loss    Hip problem    History of kidney stones    HYPERTENSION 10/29/2007   HYPOTENSION 08/13/2010   LOW BACK PAIN 04/21/2008   Mild aortic stenosis    NEPHROLITHIASIS, HX OF 04/21/2008   Neuropathy    Osteopenia    Other hyperlipidemia    Peptic ulcer    Pneumonia    PUD, HX OF 04/21/2008   Pulmonary emboli ([place]) 2021   noted on CT 12-2019   Renal disorder    Stage 3 chronic kidney disease (HCC)    Thyroid nodule    Past Surgical History:  Procedure Laterality Date   BACK SURGERY     bone cancer  1970   rt femur removed and steel rod placed   CYSTOSCOPY WITH RETROGRADE PYELOGRAM, URETEROSCOPY AND STENT PLACEMENT Right 11/06/2017   Procedure: CYSTOSCOPY WITH  STENT PLACEMENT RIGHT;  Surgeon: Raynelle Bring, MD;  Location: [facility];  Service: Urology;  Laterality: Right;   CYSTOSCOPY WITH RETROGRADE  PYELOGRAM, URETEROSCOPY AND STENT PLACEMENT Left 06/09/2019   Procedure: CYSTOSCOPY WITH RETROGRADE PYELOGRAM, URETEROSCOPY AND STENT PLACEMENT X2;  Surgeon: [name], MD;  Location: [facility];  Service: Urology;  Laterality: Left;   CYSTOSCOPY WITH RETROGRADE PYELOGRAM, URETEROSCOPY AND STENT PLACEMENT Bilateral 03/01/2020   Procedure: CYSTOSCOPY WITH RETROGRADE PYELOGRAM, URETEROSCOPY AND STENT PLACEMENT;  Surgeon: [name], MD;  Location: [facility];  Service: Urology;  Laterality: Bilateral;  75 MINS   CYSTOSCOPY/RETROGRADE/URETEROSCOPY     1 year ago at [place] in [place] Right 11/24/2017   Procedure: CYSTOSCOPY/URETEROSCOPY/ RETROGRADE/STENT REMOVAL;  Surgeon: Raynelle Bring, MD;  Location: [facility];  Service: Urology;  Laterality: Right;   HOLMIUM LASER APPLICATION Bilateral 06/04/5992   Procedure: HOLMIUM LASER APPLICATION;  Surgeon: [name], MD;  Location: [facility];  Service: Urology;  Laterality: Bilateral;   prosatectomy     Patient Active Problem List   Diagnosis Date Noted   History of pulmonary embolism 10/10/2021   HTN (hypertension) 10/10/2021   Chronic diastolic CHF (congestive heart failure) ([place]) 10/10/2021   GERD (gastroesophageal reflux disease) 10/10/2021  Ankylosing spondylitis ([place]) 10/10/2021   CKD (chronic kidney disease), stage III ([place]) 10/10/2021   Hyperlipidemia 10/10/2021   CAD (coronary artery disease) 10/10/2021   Periprosthetic fracture of shaft of femur 10/10/2021   Other fatigue 06/05/2021   CAD (coronary artery disease) 24/58/0998   Diastolic CHF ([place]) 33/82/5053   SOB (shortness of breath) on exertion 03/26/2021   Chronic anticoagulation 03/26/2021   Anemia in chronic kidney disease 01/25/2021   Acquired coagulation disorder ([place]) 12/12/2020   Senile purpura ([place]) 12/12/2020   S/P lumbar fusion 08/03/2020   Thin skin 07/25/2020   Aortic atherosclerosis ([place]) 05/17/2020   B12 deficiency 05/17/2020    Iliac artery aneurysm ([place]) 05/17/2020   Osteoporosis 04/04/2020   Idiopathic neuropathy 01/28/2020   Recurrent falls 01/28/2020   Chronic kidney disease (CKD), stage III (moderate) ([place]) 01/19/2020   Pulmonary emboli ([place]) 01/14/2020   GERD (gastroesophageal reflux disease) 12/22/2019   Essential tremor 12/22/2019   Femoral condyle fracture ([place]) 07/15/2019   Gross hematuria 06/14/2019   Hyperlipidemia 11/11/2017   Kidney stone 11/05/2017   Community acquired pneumonia of right upper lobe of lung 11/05/2017   Right ureteral stone 11/05/2017   History of total right hip replacement 03/08/2017   History of prostate cancer 03/08/2017   Personal history of prostate cancer 02/28/2017   High risk medication use 02/28/2017   DDD (degenerative disc disease), thoracic 02/28/2017   Ataxia 05/18/2016   Vestibular disequilibrium 05/18/2016   Ankylosing spondylitis of cervical region ([place]) 05/24/2014   Lung nodule, solitary 05/24/2014   Lumbosacral stenosis 06/05/2013   Peripheral edema 06/08/2012   Subcortical microvascular ischemic occlusive disease 08/13/2010   Extrinsic asthma 04/07/2009   Backache 04/21/2008   History of peptic ulcer disease 04/21/2008   Essential hypertension 10/29/2007  REFERRING PROVIDER: [name], MD   REFERRING DIAG:  M54.50,G89.29 (ICD-10-CM) - Chronic bilateral low back pain without sciatica  R26.89 (ICD-10-CM) - Other abnormalities of gait and mobility      THERAPY DIAG:  Pain in right hip   Pain in right leg   Difficulty in walking, not elsewhere classified   Muscle weakness (generalized)   ONSET DATE: October 10, 2021 recent fall causing fracture   SUBJECTIVE:                                                                                                                                                                                            SUBJECTIVE STATEMENT: Was in a lot of pain after last aqua visit. I think maybe the cord around  my thighs in side stepping was what did it.      PERTINENT  HISTORY:  Stenosis, Rt THA 13 yr ago with h/o periprosthetic fx & revision in 2022, h/o back surgery  Hard of hearing; can hear out of Left ear   PAIN:  Are you having pain? Yes NPRS scale: 6/10 Pain location: Rt ant hip/ groin to knee    Aggravating factors: too much activity Relieving factors: rest   PRECAUTIONS: Posterior hip   WEIGHT BEARING RESTRICTIONS No   FALLS:  Has patient fallen in last 6 months? Yes, Number of falls: 1 4/21: no falls recently     PATIENT GOALS build up strength, get rid of assistance for toileting and getting in/out of bed, no AD in gait, stairs, Son's wedding in [place] first of June     OBJECTIVE: *All objective findings taken at [facility] unless otherwise noted.    PATIENT SURVEYS:  LEFS 39  4/21: 46  5/19: 43     COGNITION:          Overall cognitive status: Within functional limits for tasks assessed                        SENSATION:          Bil hand/feet neuropathy unchanged since evaluation       LE MMT:  5/19 measured with handheld dynamometry Normative values: hip flexion 38 lb, knee extension 79 lb   MMT Right 03/06/2022 Right/Left 04/19/22 Rt/Lt 05/17/22  Hip flexion 4/5 11.1/ 24.3 20.9/42.6  Hip extension      Hip abduction      Hip adduction      Hip internal rotation      Hip external rotation      Knee flexion 3/5 32.3/37.9 37.7/55.3  Knee extension 3/5 12.1/42.4 20.7/60.7  Ankle dorsiflexion      Ankle plantarflexion      Ankle inversion      Ankle eversion       (Blank rows = not tested)       FUNCTIONAL TESTS:  5 times sit to stand: 38s 4/21: 18s without use of UEs Berg Balance Scale: 33/56 4/21: 40/50   GAIT:   Comments: SPC in Lt hand, rt leg abducted and externally rotated, antalgic on Rt with pressure placed through cane 4/21: SPC in Lt hand, Rt leg no longer abd /ER; does demo Rt trunk SB in Rt stance phase       TODAY'S TREATMENT   5/19 MANUAL: STM to Rt TFL & ITB, lateral hip distraction; muscle testing with dynamometry Seated HSS Seated gastroc stretch with strap Seated piriformis stretch Standing hip flexor stretch   TODAY'S TREATMENT  Pt seen for aquatic therapy today.  Treatment took place in water 3.25-4.8 ft in depth at the [facility] pool. Temp of water was 90.  Pt entered/exited the pool via stairs independently with bilat rail, step-to pattern.  Without UE support:  -Forward walking - cues for reciprocal arm swing with trunk rotation -Backward walking  - SLS pushing tan level cording with other foot to ground x 10 each leg, with/without back support (no challenge) -Anti-rotation holding tan level cording with side step  - SLS with shoulder flutter with blue hand buoys in shoulder flexion at surface (decreased balance in Rt SLS) - SLS with blue hand buoys with 3 way lay kick (alterating LEs; LAQ after hip flexion) x 10 each - (with blue hand buoys) Rt forward lunge and return to neutral x 10 (very difficult and increased RLE and LBP to  9/10) -Tandem gait forwards/backwards with support of rainbow hand buoys - forward/ backward(in 63f 5" water) - Rt/Lt  single leg heel raises holding blue hand buoys x 10 -high knee forward marching deep area to shallow - mini curtsies x 1 rep each - not tolerated - forward step ups R/ retro step down LLE (6 steps, heavy UE on rails) -Standing at steps- R/L foot tap to step 1, step 2, then return to ground - x 10 each, no UE support - Hamstring stretch with foot on 2nd step x 20s x 2 each leg   Pt requires buoyancy for support and to offload joints with strengthening exercises. Viscosity of the water is needed for resistance of strengthening; water current perturbations provides challenge to standing balance unsupported, requiring increased core activation.     PATIENT EDUCATION:  Education details: AGeophysicist/field seismologistof condition, POC, HEP, exercise  form/rationale Person educated: Patient and Spouse Education method: Explanation, Demonstration, Tactile cues, and Verbal cues Education comprehension: verbalized understanding, returned demonstration, verbal cues required, tactile cues required, and needs further education     HOME EXERCISE PROGRAM: [id number]   ASSESSMENT:   CLINICAL IMPRESSION: Tightness in TFL and ITB reduced with manual therapy today which reduced some of the pain in the bursa. Pt demo significantly greater hip ER in Rt hip vs Lt leading to spasm for stability. Large improvements in LE strength. Discussed the need for 80% strenght in Rt compared to Lt to feel more "norman" in gait.    OBJECTIVE IMPAIRMENTS Abnormal gait, decreased activity tolerance, decreased balance, difficulty walking, decreased strength, impaired sensation, improper body mechanics, postural dysfunction, and pain.    ACTIVITY LIMITATIONS cleaning, community activity, driving, meal prep, laundry, and shopping.    PERSONAL FACTORS  see problem list  are also affecting patient's functional outcome.      REHAB POTENTIAL: Good   CLINICAL DECISION MAKING: Unstable/unpredictable   EVALUATION COMPLEXITY: High     GOALS: Goals reviewed with patient? Yes   SHORT TERM GOALS:   Able to enter/exit pool independently with use of hand rails Baseline: will begin training Target date: 04/03/2022 Goal status: Achieved    2.  Good tolerance to exercise without limitations in next day due to pain Baseline: will establish proper working level Target date: 04/03/2022 Goal status: Ongoing    3.  Able to shift weight to perform step taps with UE assist Baseline: unable at eval Target date: 04/03/2022 Goal status: Achieved    4.  Able to ambulate with centered weight distribution Baseline: at eval- leg abducted and trunk lean Target date: 04/03/2022 Goal status: Ongoing       LONG TERM GOALS:   BERG to improve by MDC of 6 points Baseline: 33 at  eval Target date:  07/06/22 Goal status: INITIAL   2.  Pt will ambulate safely around his home wihtout use of AD Baseline: using cane and walker at eval Target date: 07/06/22 Goal status: INITIAL   3.  Able to navigate stairs in home safely and confidently Baseline: unable at eval Target date: 07/06/22 Goal status: INITIAL   4.  LEFS to improve by MDC Baseline: will retest  at appropriate time Target date: 07/06/22 Goal status: INITIAL   5.  Pt will demo 5TSTS in 20s or less Baseline: 38s at eval Target date: 07/06/22 Goal status: Achieved - 04/19/22   6.  Pt will be able to rid home of aid devices Baseline: using devices such as elevated toilet seat at eval Target date:  07/06/22 Goal status: INITIAL   PLAN: PT FREQUENCY: 1-2x/week   PT DURATION: other: 4 months   PLANNED INTERVENTIONS: Therapeutic exercises, Therapeutic activity, Neuromuscular re-education, Balance training, Gait training, Patient/Family education, Joint mobilization, Stair training, Aquatic Therapy, Electrical stimulation, Cryotherapy, Moist heat, Taping, and Manual therapy   PLAN FOR NEXT SESSION: trunk rotation & arm swing in gait, gross Rt LE strength.  SLS strengthening; even step length/stance time. Quad stretch ?    Kerin Perna, PTA 05/17/22 11:50 AM

## 2022-05-22 ENCOUNTER — Encounter (HOSPITAL_BASED_OUTPATIENT_CLINIC_OR_DEPARTMENT_OTHER): Payer: Self-pay | Admitting: Physical Therapy

## 2022-05-22 ENCOUNTER — Ambulatory Visit (HOSPITAL_BASED_OUTPATIENT_CLINIC_OR_DEPARTMENT_OTHER): Payer: Medicare Other | Admitting: Physical Therapy

## 2022-05-22 DIAGNOSIS — M6281 Muscle weakness (generalized): Secondary | ICD-10-CM | POA: Diagnosis not present

## 2022-05-22 DIAGNOSIS — M79604 Pain in right leg: Secondary | ICD-10-CM

## 2022-05-22 DIAGNOSIS — M25551 Pain in right hip: Secondary | ICD-10-CM | POA: Diagnosis not present

## 2022-05-22 DIAGNOSIS — R262 Difficulty in walking, not elsewhere classified: Secondary | ICD-10-CM | POA: Diagnosis not present

## 2022-05-22 NOTE — Therapy (Signed)
OUTPATIENT PHYSICAL THERAPY TREATMENT NOTE     Patient Name: Bruce Ball MRN: [id number] DOB:January 22, 1946, 76 y.o., male Today's Date: 05/22/2022  PCP: [name], MD REFERRING PROVIDER: [name], MD   PT End of Session - 05/22/22 1147     Visit Number 17    Number of Visits 33    Date for PT Re-Evaluation 07/06/22    Authorization Type MCR    Progress Note Due on Visit 66    PT Start Time 1145    PT Stop Time 1228    PT Time Calculation (min) 43 min    Activity Tolerance Patient tolerated treatment well    Behavior During Therapy WFL for tasks assessed/performed              Past Medical History:  Diagnosis Date   Ankylosing spondylitis ([place])    Arthritis of right knee    Basal cell carcinoma    Chronic back pain greater than 3 months duration    Chronic leg pain    CKD (chronic kidney disease), stage II    Coronary artery calcification seen on CAT scan 03/27/2021   Deafness in right ear    Dyspnea    cause by PE   Essential tremor    Extrinsic asthma, unspecified 04/07/2009   PT DENIES   GERD (gastroesophageal reflux disease)    Hearing loss    Hip problem    History of kidney stones    HYPERTENSION 10/29/2007   HYPOTENSION 08/13/2010   LOW BACK PAIN 04/21/2008   Mild aortic stenosis    NEPHROLITHIASIS, HX OF 04/21/2008   Neuropathy    Osteopenia    Other hyperlipidemia    Peptic ulcer    Pneumonia    PUD, HX OF 04/21/2008   Pulmonary emboli ([place]) 2021   noted on CT 12-2019   Renal disorder    Stage 3 chronic kidney disease (HCC)    Thyroid nodule    Past Surgical History:  Procedure Laterality Date   BACK SURGERY     bone cancer  1970   rt femur removed and steel rod placed   CYSTOSCOPY WITH RETROGRADE PYELOGRAM, URETEROSCOPY AND STENT PLACEMENT Right 11/06/2017   Procedure: CYSTOSCOPY WITH  STENT PLACEMENT RIGHT;  Surgeon: Raynelle Bring, MD;  Location: [facility];  Service: Urology;  Laterality: Right;   CYSTOSCOPY WITH RETROGRADE  PYELOGRAM, URETEROSCOPY AND STENT PLACEMENT Left 06/09/2019   Procedure: CYSTOSCOPY WITH RETROGRADE PYELOGRAM, URETEROSCOPY AND STENT PLACEMENT X2;  Surgeon: [name], MD;  Location: [facility];  Service: Urology;  Laterality: Left;   CYSTOSCOPY WITH RETROGRADE PYELOGRAM, URETEROSCOPY AND STENT PLACEMENT Bilateral 03/01/2020   Procedure: CYSTOSCOPY WITH RETROGRADE PYELOGRAM, URETEROSCOPY AND STENT PLACEMENT;  Surgeon: [name], MD;  Location: [facility];  Service: Urology;  Laterality: Bilateral;  75 MINS   CYSTOSCOPY/RETROGRADE/URETEROSCOPY     1 year ago at [place] Right 11/24/2017   Procedure: CYSTOSCOPY/URETEROSCOPY/ RETROGRADE/STENT REMOVAL;  Surgeon: Raynelle Bring, MD;  Location: [facility];  Service: Urology;  Laterality: Right;   HOLMIUM LASER APPLICATION Bilateral 09/01/7168   Procedure: HOLMIUM LASER APPLICATION;  Surgeon: [name], MD;  Location: [facility];  Service: Urology;  Laterality: Bilateral;   prosatectomy     Patient Active Problem List   Diagnosis Date Noted   History of pulmonary embolism 10/10/2021   HTN (hypertension) 10/10/2021   Chronic diastolic CHF (congestive heart failure) ([place]) 10/10/2021   GERD (gastroesophageal reflux disease) 10/10/2021  Ankylosing spondylitis ([place]) 10/10/2021   CKD (chronic kidney disease), stage III ([place]) 10/10/2021   Hyperlipidemia 10/10/2021   CAD (coronary artery disease) 10/10/2021   Periprosthetic fracture of shaft of femur 10/10/2021   Other fatigue 06/05/2021   CAD (coronary artery disease) 99/83/3825   Diastolic CHF ([place]) 05/39/7673   SOB (shortness of breath) on exertion 03/26/2021   Chronic anticoagulation 03/26/2021   Anemia in chronic kidney disease 01/25/2021   Acquired coagulation disorder ([place]) 12/12/2020   Senile purpura ([place]) 12/12/2020   S/P lumbar fusion 08/03/2020   Thin skin 07/25/2020   Aortic atherosclerosis ([place]) 05/17/2020   B12 deficiency 05/17/2020    Iliac artery aneurysm ([place]) 05/17/2020   Osteoporosis 04/04/2020   Idiopathic neuropathy 01/28/2020   Recurrent falls 01/28/2020   Chronic kidney disease (CKD), stage III (moderate) ([place]) 01/19/2020   Pulmonary emboli ([place]) 01/14/2020   GERD (gastroesophageal reflux disease) 12/22/2019   Essential tremor 12/22/2019   Femoral condyle fracture ([place]) 07/15/2019   Gross hematuria 06/14/2019   Hyperlipidemia 11/11/2017   Kidney stone 11/05/2017   Community acquired pneumonia of right upper lobe of lung 11/05/2017   Right ureteral stone 11/05/2017   History of total right hip replacement 03/08/2017   History of prostate cancer 03/08/2017   Personal history of prostate cancer 02/28/2017   High risk medication use 02/28/2017   DDD (degenerative disc disease), thoracic 02/28/2017   Ataxia 05/18/2016   Vestibular disequilibrium 05/18/2016   Ankylosing spondylitis of cervical region ([place]) 05/24/2014   Lung nodule, solitary 05/24/2014   Lumbosacral stenosis 06/05/2013   Peripheral edema 06/08/2012   Subcortical microvascular ischemic occlusive disease 08/13/2010   Extrinsic asthma 04/07/2009   Backache 04/21/2008   History of peptic ulcer disease 04/21/2008   Essential hypertension 10/29/2007  REFERRING PROVIDER: [name], MD   REFERRING DIAG:  M54.50,G89.29 (ICD-10-CM) - Chronic bilateral low back pain without sciatica  R26.89 (ICD-10-CM) - Other abnormalities of gait and mobility      THERAPY DIAG:  Pain in right hip   Pain in right leg   Difficulty in walking, not elsewhere classified   Muscle weakness (generalized)   ONSET DATE: October 10, 2021 recent fall causing fracture   SUBJECTIVE:                                                                                                                                                                                            SUBJECTIVE STATEMENT: Pt reports he is still recovering from the resisted side stepping in last  aquatic session.  "I think that shot in my hip set me back 2 months.  I had  to go back to using my walker again (when I go to the pool in the morning )".       PERTINENT HISTORY:  Stenosis, Rt THA 33 yr ago with h/o periprosthetic fx & revision in 2022, h/o back surgery  Hard of hearing; can hear out of Left ear   PAIN:  Are you having pain? Yes NPRS scale: 7-8/10 Pain location: Rt ant hip/ groin to knee    Aggravating factors: too much activity Relieving factors: rest   PRECAUTIONS: Posterior hip   WEIGHT BEARING RESTRICTIONS No   FALLS:  Has patient fallen in last 6 months? Yes, Number of falls: 1 4/21: no falls recently     PATIENT GOALS build up strength, get rid of assistance for toileting and getting in/out of bed, no AD in gait, stairs, Son's wedding in [place] first of June     OBJECTIVE: *All objective findings taken at [facility] unless otherwise noted.    PATIENT SURVEYS:  LEFS 39  4/21: 46  5/19: 43     COGNITION:          Overall cognitive status: Within functional limits for tasks assessed                        SENSATION:          Bil hand/feet neuropathy unchanged since evaluation       LE MMT:  5/19 measured with handheld dynamometry Normative values: hip flexion 38 lb, knee extension 79 lb   MMT Right 03/06/2022 Right/Left 04/19/22 Rt/Lt 05/17/22  Hip flexion 4/5 11.1/ 24.3 20.9/42.6  Hip extension      Hip abduction      Hip adduction      Hip internal rotation      Hip external rotation      Knee flexion 3/5 32.3/37.9 37.7/55.3  Knee extension 3/5 12.1/42.4 20.7/60.7  Ankle dorsiflexion      Ankle plantarflexion      Ankle inversion      Ankle eversion       (Blank rows = not tested)       FUNCTIONAL TESTS:  5 times sit to stand: 38s 4/21: 18s without use of UEs Berg Balance Scale: 33/56 4/21: 40/50   GAIT:   Comments: SPC in Lt hand, rt leg abducted and externally rotated, antalgic on Rt with pressure placed through cane 4/21:  SPC in Lt hand, Rt leg no longer abd /ER; does demo Rt trunk SB in Rt stance phase       TODAY'S TREATMENT  Pt seen for aquatic therapy today.  Treatment took place in water 3.25-4.8 ft in depth at the [employer] pool. Temp of water was 92.  Pt entered/exited the pool via stairs independently with bilat rail, step-to pattern.  Without UE support:  -Forward walking - cues for reciprocal arm swing with trunk rotation - SLS RLE at 4 ft - increased pain, stopped - squats at 41f 6" x 10 x 2 - Rt hip flexor stretch x 20s x 2 -Tandem gait forwards/backwards with support of yellow hand buoys - forward/ backward(in 353f8" water) -Forward step ups onto blue step with RLE - tolerated well at 59f38f" the best (NOT tolerated at less than 59ft48fpth) -side step with tap behinds  UE on wall to no support - SLS with shoulder flutter with blue hand buoys in shoulder flexion at surface (decreased balance in Rt SLS) - R/L weight shifts with  hamstring curls -kick board push forward leading LLE, leading RLE (aggrivated Rt ant hip) -squat pushing down yellow hand buoy x 5 in 44f 6" water -Rt hip flexor stretch x 20s - Hamstring stretch with foot on 2nd step x 20s each leg   Pt requires buoyancy for support and to offload joints with strengthening exercises. Viscosity of the water is needed for resistance of strengthening; water current perturbations provides challenge to standing balance unsupported, requiring increased core activation.     PATIENT EDUCATION:  Education details: exercise form/rationale Person educated: Patient and Spouse Education method: Explanation, Demonstration, Tactile cues, and Verbal cues Education comprehension: verbalized understanding, returned demonstration, verbal cues required, tactile cues required, and needs further education     HOME EXERCISE PROGRAM: [id number]   ASSESSMENT:   CLINICAL IMPRESSION: Pt arrives with elevated pain level in RLE.  He did not  tolerate SLS on Rt in depth less than 429f2".  He was able to tolerate Rt step ups without UE support once the step was at 4f9fin. Kick board push while walking forward "aggravated" Rt ant hip. Worked in a variety of depths with dynamic challenges into Rt SLS for hip strengthening. Encouraged pt to massage Rt hip (as JesJanett Billowd last session) to decrease restrictions and improve mobility.  Progressing towards remaining goals.    OBJECTIVE IMPAIRMENTS Abnormal gait, decreased activity tolerance, decreased balance, difficulty walking, decreased strength, impaired sensation, improper body mechanics, postural dysfunction, and pain.    ACTIVITY LIMITATIONS cleaning, community activity, driving, meal prep, laundry, and shopping.    PERSONAL FACTORS  see problem list  are also affecting patient's functional outcome.      REHAB POTENTIAL: Good   CLINICAL DECISION MAKING: Unstable/unpredictable   EVALUATION COMPLEXITY: High     GOALS: Goals reviewed with patient? Yes   SHORT TERM GOALS:   Able to enter/exit pool independently with use of hand rails Baseline: will begin training Target date: 04/03/2022 Goal status: Achieved    2.  Good tolerance to exercise without limitations in next day due to pain Baseline: will establish proper working level Target date: 04/03/2022 Goal status: Ongoing    3.  Able to shift weight to perform step taps with UE assist Baseline: unable at eval Target date: 04/03/2022 Goal status: Achieved    4.  Able to ambulate with centered weight distribution Baseline: at eval- leg abducted and trunk lean Target date: 04/03/2022 Goal status: Ongoing       LONG TERM GOALS:   BERG to improve by MDC of 6 points Baseline: 33 at eval Target date:  07/06/22 Goal status: INITIAL   2.  Pt will ambulate safely around his home wihtout use of AD Baseline: using cane and walker at eval Target date: 07/06/22 Goal status: INITIAL   3.  Able to navigate stairs in home safely  and confidently Baseline: unable at eval Target date: 07/06/22 Goal status: INITIAL   4.  LEFS to improve by MDC Baseline: will retest  at appropriate time Target date: 07/06/22 Goal status: INITIAL   5.  Pt will demo 5TSTS in 20s or less Baseline: 38s at eval Target date: 07/06/22 Goal status: Achieved - 04/19/22   6.  Pt will be able to rid home of aid devices Baseline: using devices such as elevated toilet seat at eval Target date: 07/06/22 Goal status: INITIAL   PLAN: PT FREQUENCY: 1-2x/week   PT DURATION: other: 4 months   PLANNED INTERVENTIONS: Therapeutic exercises, Therapeutic activity, Neuromuscular re-education, Balance  training, Gait training, Patient/Family education, Joint mobilization, Stair training, Aquatic Therapy, Electrical stimulation, Cryotherapy, Moist heat, Taping, and Manual therapy   PLAN FOR NEXT SESSION: trunk rotation & arm swing in gait, gross Rt LE strength.  SLS strengthening; even step length/stance time. Quad stretch ?  Kerin Perna, PTA 05/22/22 12:46 PM

## 2022-05-23 DIAGNOSIS — J3089 Other allergic rhinitis: Secondary | ICD-10-CM | POA: Diagnosis not present

## 2022-05-23 DIAGNOSIS — J309 Allergic rhinitis, unspecified: Secondary | ICD-10-CM | POA: Diagnosis not present

## 2022-05-23 DIAGNOSIS — Z9889 Other specified postprocedural states: Secondary | ICD-10-CM | POA: Diagnosis not present

## 2022-05-23 DIAGNOSIS — Z87891 Personal history of nicotine dependence: Secondary | ICD-10-CM | POA: Diagnosis not present

## 2022-05-23 DIAGNOSIS — J339 Nasal polyp, unspecified: Secondary | ICD-10-CM | POA: Diagnosis not present

## 2022-05-23 DIAGNOSIS — Z79899 Other long term (current) drug therapy: Secondary | ICD-10-CM | POA: Diagnosis not present

## 2022-05-23 DIAGNOSIS — Z7951 Long term (current) use of inhaled steroids: Secondary | ICD-10-CM | POA: Diagnosis not present

## 2022-05-23 DIAGNOSIS — J343 Hypertrophy of nasal turbinates: Secondary | ICD-10-CM | POA: Diagnosis not present

## 2022-05-23 DIAGNOSIS — J328 Other chronic sinusitis: Secondary | ICD-10-CM | POA: Diagnosis not present

## 2022-05-24 ENCOUNTER — Ambulatory Visit (HOSPITAL_BASED_OUTPATIENT_CLINIC_OR_DEPARTMENT_OTHER): Payer: Medicare Other | Admitting: Physical Therapy

## 2022-05-24 ENCOUNTER — Encounter (HOSPITAL_BASED_OUTPATIENT_CLINIC_OR_DEPARTMENT_OTHER): Payer: Self-pay | Admitting: Physical Therapy

## 2022-05-24 DIAGNOSIS — M79604 Pain in right leg: Secondary | ICD-10-CM

## 2022-05-24 DIAGNOSIS — M25551 Pain in right hip: Secondary | ICD-10-CM

## 2022-05-24 DIAGNOSIS — M6281 Muscle weakness (generalized): Secondary | ICD-10-CM

## 2022-05-24 DIAGNOSIS — R262 Difficulty in walking, not elsewhere classified: Secondary | ICD-10-CM

## 2022-05-24 NOTE — Therapy (Signed)
OUTPATIENT PHYSICAL THERAPY TREATMENT NOTE     Patient Name: Bruce Ball MRN: [id number] DOB:1946/07/09, 76 y.o., male Today's Date: 05/24/2022  PCP: [name], MD REFERRING PROVIDER: [name], MD   PT End of Session - 05/24/22 1105     Visit Number 18    Number of Visits 87    Date for PT Re-Evaluation 07/06/22    Authorization Type MCR    Progress Note Due on Visit 34    PT Start Time 1102    PT Stop Time 1140    PT Time Calculation (min) 38 min    Activity Tolerance Patient tolerated treatment well    Behavior During Therapy WFL for tasks assessed/performed              Past Medical History:  Diagnosis Date   Ankylosing spondylitis ([place])    Arthritis of right knee    Basal cell carcinoma    Chronic back pain greater than 3 months duration    Chronic leg pain    CKD (chronic kidney disease), stage II    Coronary artery calcification seen on CAT scan 03/27/2021   Deafness in right ear    Dyspnea    cause by PE   Essential tremor    Extrinsic asthma, unspecified 04/07/2009   PT DENIES   GERD (gastroesophageal reflux disease)    Hearing loss    Hip problem    History of kidney stones    HYPERTENSION 10/29/2007   HYPOTENSION 08/13/2010   LOW BACK PAIN 04/21/2008   Mild aortic stenosis    NEPHROLITHIASIS, HX OF 04/21/2008   Neuropathy    Osteopenia    Other hyperlipidemia    Peptic ulcer    Pneumonia    PUD, HX OF 04/21/2008   Pulmonary emboli ([place]) 2021   noted on CT 12-2019   Renal disorder    Stage 3 chronic kidney disease (HCC)    Thyroid nodule    Past Surgical History:  Procedure Laterality Date   BACK SURGERY     bone cancer  1970   rt femur removed and steel rod placed   CYSTOSCOPY WITH RETROGRADE PYELOGRAM, URETEROSCOPY AND STENT PLACEMENT Right 11/06/2017   Procedure: CYSTOSCOPY WITH  STENT PLACEMENT RIGHT;  Surgeon: Raynelle Bring, MD;  [facility];  Service: Urology;  Laterality: Right;   CYSTOSCOPY WITH RETROGRADE  PYELOGRAM, URETEROSCOPY AND STENT PLACEMENT Left 06/09/2019   Procedure: CYSTOSCOPY WITH RETROGRADE PYELOGRAM, URETEROSCOPY AND STENT PLACEMENT X2;  Surgeon: [name], MD;  [facility];  Service: Urology;  Laterality: Left;   CYSTOSCOPY WITH RETROGRADE PYELOGRAM, URETEROSCOPY AND STENT PLACEMENT Bilateral 03/01/2020   Procedure: CYSTOSCOPY WITH RETROGRADE PYELOGRAM, URETEROSCOPY AND STENT PLACEMENT;  Surgeon: [name], MD;  [facility];  Service: Urology;  Laterality: Bilateral;  75 MINS   CYSTOSCOPY/RETROGRADE/URETEROSCOPY     1 year ago at [place] in [place] Right 11/24/2017   Procedure: CYSTOSCOPY/URETEROSCOPY/ RETROGRADE/STENT REMOVAL;  Surgeon: Raynelle Bring, MD;  [facility];  Service: Urology;  Laterality: Right;   HOLMIUM LASER APPLICATION Bilateral 03/31/6383   Procedure: HOLMIUM LASER APPLICATION;  Surgeon: [name], MD;  [facility];  Service: Urology;  Laterality: Bilateral;   prosatectomy     Patient Active Problem List   Diagnosis Date Noted   History of pulmonary embolism 10/10/2021   HTN (hypertension) 10/10/2021   Chronic diastolic CHF (congestive heart failure) ([place]) 10/10/2021   GERD (gastroesophageal reflux disease) 10/10/2021  Ankylosing spondylitis ([place]) 10/10/2021   CKD (chronic kidney disease), stage III ([place]) 10/10/2021   Hyperlipidemia 10/10/2021   CAD (coronary artery disease) 10/10/2021   Periprosthetic fracture of shaft of femur 10/10/2021   Other fatigue 06/05/2021   CAD (coronary artery disease) 85/27/7824   Diastolic CHF ([place]) 23/53/6144   SOB (shortness of breath) on exertion 03/26/2021   Chronic anticoagulation 03/26/2021   Anemia in chronic kidney disease 01/25/2021   Acquired coagulation disorder ([place]) 12/12/2020   Senile purpura ([place]) 12/12/2020   S/P lumbar fusion 08/03/2020   Thin skin 07/25/2020   Aortic atherosclerosis ([place]) 05/17/2020   B12 deficiency 05/17/2020    Iliac artery aneurysm ([place]) 05/17/2020   Osteoporosis 04/04/2020   Idiopathic neuropathy 01/28/2020   Recurrent falls 01/28/2020   Chronic kidney disease (CKD), stage III (moderate) ([place]) 01/19/2020   Pulmonary emboli ([place]) 01/14/2020   GERD (gastroesophageal reflux disease) 12/22/2019   Essential tremor 12/22/2019   Femoral condyle fracture ([place]) 07/15/2019   Gross hematuria 06/14/2019   Hyperlipidemia 11/11/2017   Kidney stone 11/05/2017   Community acquired pneumonia of right upper lobe of lung 11/05/2017   Right ureteral stone 11/05/2017   History of total right hip replacement 03/08/2017   History of prostate cancer 03/08/2017   Personal history of prostate cancer 02/28/2017   High risk medication use 02/28/2017   DDD (degenerative disc disease), thoracic 02/28/2017   Ataxia 05/18/2016   Vestibular disequilibrium 05/18/2016   Ankylosing spondylitis of cervical region ([place]) 05/24/2014   Lung nodule, solitary 05/24/2014   Lumbosacral stenosis 06/05/2013   Peripheral edema 06/08/2012   Subcortical microvascular ischemic occlusive disease 08/13/2010   Extrinsic asthma 04/07/2009   Backache 04/21/2008   History of peptic ulcer disease 04/21/2008   Essential hypertension 10/29/2007  REFERRING PROVIDER: [name], MD   REFERRING DIAG:  M54.50,G89.29 (ICD-10-CM) - Chronic bilateral low back pain without sciatica  R26.89 (ICD-10-CM) - Other abnormalities of gait and mobility      THERAPY DIAG:  Pain in right hip   Pain in right leg   Difficulty in walking, not elsewhere classified   Muscle weakness (generalized)   ONSET DATE: October 10, 2021 recent fall causing fracture   SUBJECTIVE:                                                                                                                                                                                            SUBJECTIVE STATEMENT: Feeling better with just some discomfort. Do not feel like I need the  manual today. Leaving next Sat for the wedding.      PERTINENT HISTORY:  Stenosis, Rt  THA 58 yr ago with h/o periprosthetic fx & revision in 2022, h/o back surgery  Hard of hearing; can hear out of Left ear   PAIN:  Are you having pain? Yes NPRS scale: 7-8/10 Pain location: Rt ant hip/ groin to knee    Aggravating factors: too much activity Relieving factors: rest   PRECAUTIONS: Posterior hip   WEIGHT BEARING RESTRICTIONS No   FALLS:  Has patient fallen in last 6 months? Yes, Number of falls: 1 4/21: no falls recently     PATIENT GOALS build up strength, get rid of assistance for toileting and getting in/out of bed, no AD in gait, stairs, Son's wedding in [place] first of June     OBJECTIVE: *All objective findings taken at [facility] unless otherwise noted.    PATIENT SURVEYS:  LEFS 39  4/21: 46  5/19: 43     COGNITION:          Overall cognitive status: Within functional limits for tasks assessed                        SENSATION:          Bil hand/feet neuropathy unchanged since evaluation       LE MMT:  5/19 measured with handheld dynamometry Normative values: hip flexion 38 lb, knee extension 79 lb   MMT Right 03/06/2022 Right/Left 04/19/22 Rt/Lt 05/17/22  Hip flexion 4/5 11.1/ 24.3 20.9/42.6  Hip extension      Hip abduction      Hip adduction      Hip internal rotation      Hip external rotation      Knee flexion 3/5 32.3/37.9 37.7/55.3  Knee extension 3/5 12.1/42.4 20.7/60.7  Ankle dorsiflexion      Ankle plantarflexion      Ankle inversion      Ankle eversion       (Blank rows = not tested)       FUNCTIONAL TESTS:  5 times sit to stand: 38s 4/21: 18s without use of UEs Berg Balance Scale: 33/56 4/21: 40/50   GAIT:   Comments: SPC in Lt hand, rt leg abducted and externally rotated, antalgic on Rt with pressure placed through cane 4/21: SPC in Lt hand, Rt leg no longer abd /ER; does demo Rt trunk SB in Rt stance phase       TODAY'S  TREATMENT   5/25: Nu step L7 UE & LE 5 min SLS with opp foot on airex, knee flexed- lateral weight shifts LAQ 3lb Seated march, 3lb on Rt ankle- cues for abdominal engagement Side stepping at bar- neutral pelvis Wide tandem with head turns- abdominal engagement GAIT: progression of Rt hip over RLE with [facility]      PATIENT EDUCATION:  Education details: exercise form/rationale Person educated: Patient and Spouse Education method: Explanation, Demonstration, Tactile cues, and Verbal cues Education comprehension: verbalized understanding, returned demonstration, verbal cues required, tactile cues required, and needs further education     HOME EXERCISE PROGRAM: [id number]   ASSESSMENT:   CLINICAL IMPRESSION: Paulita Cradle to correct gait form to progress hip over Rt LE in stance phase. Denied increase in pain. Notable balance deficit with head turns outside of neutral stance.    OBJECTIVE IMPAIRMENTS Abnormal gait, decreased activity tolerance, decreased balance, difficulty walking, decreased strength, impaired sensation, improper body mechanics, postural dysfunction, and pain.    ACTIVITY LIMITATIONS cleaning, community activity, driving, meal prep, laundry, and shopping.    PERSONAL FACTORS  see  problem list  are also affecting patient's functional outcome.      REHAB POTENTIAL: Good   CLINICAL DECISION MAKING: Unstable/unpredictable   EVALUATION COMPLEXITY: High     GOALS: Goals reviewed with patient? Yes   SHORT TERM GOALS:   Able to enter/exit pool independently with use of hand rails Baseline: will begin training Target date: 04/03/2022 Goal status: Achieved    2.  Good tolerance to exercise without limitations in next day due to pain Baseline: will establish proper working level Target date: 04/03/2022 Goal status: Ongoing    3.  Able to shift weight to perform step taps with UE assist Baseline: unable at eval Target date: 04/03/2022 Goal status: Achieved    4.  Able to  ambulate with centered weight distribution Baseline: at eval- leg abducted and trunk lean Target date: 04/03/2022 Goal status: Ongoing       LONG TERM GOALS:   BERG to improve by MDC of 6 points Baseline: 33 at eval Target date:  07/06/22 Goal status: INITIAL   2.  Pt will ambulate safely around his home wihtout use of AD Baseline: using cane and walker at eval Target date: 07/06/22 Goal status: INITIAL   3.  Able to navigate stairs in home safely and confidently Baseline: unable at eval Target date: 07/06/22 Goal status: INITIAL   4.  LEFS to improve by MDC Baseline: will retest  at appropriate time Target date: 07/06/22 Goal status: INITIAL   5.  Pt will demo 5TSTS in 20s or less Baseline: 38s at eval Target date: 07/06/22 Goal status: Achieved - 04/19/22   6.  Pt will be able to rid home of aid devices Baseline: using devices such as elevated toilet seat at eval Target date: 07/06/22 Goal status: INITIAL   PLAN: PT FREQUENCY: 1-2x/week   PT DURATION: other: 4 months   PLANNED INTERVENTIONS: Therapeutic exercises, Therapeutic activity, Neuromuscular re-education, Balance training, Gait training, Patient/Family education, Joint mobilization, Stair training, Aquatic Therapy, Electrical stimulation, Cryotherapy, Moist heat, Taping, and Manual therapy   PLAN FOR NEXT SESSION: balance with head turns. Hip progression in gait without AD  Javid Kemler C. Kailei Cowens PT, DPT 05/24/22 11:44 AM

## 2022-05-29 ENCOUNTER — Encounter (HOSPITAL_BASED_OUTPATIENT_CLINIC_OR_DEPARTMENT_OTHER): Payer: Self-pay | Admitting: Physical Therapy

## 2022-05-29 ENCOUNTER — Ambulatory Visit (HOSPITAL_BASED_OUTPATIENT_CLINIC_OR_DEPARTMENT_OTHER): Payer: Medicare Other | Admitting: Physical Therapy

## 2022-05-29 DIAGNOSIS — M79604 Pain in right leg: Secondary | ICD-10-CM

## 2022-05-29 DIAGNOSIS — R262 Difficulty in walking, not elsewhere classified: Secondary | ICD-10-CM

## 2022-05-29 DIAGNOSIS — I110 Hypertensive heart disease with heart failure: Secondary | ICD-10-CM

## 2022-05-29 DIAGNOSIS — E785 Hyperlipidemia, unspecified: Secondary | ICD-10-CM

## 2022-05-29 DIAGNOSIS — M25551 Pain in right hip: Secondary | ICD-10-CM

## 2022-05-29 DIAGNOSIS — M6281 Muscle weakness (generalized): Secondary | ICD-10-CM

## 2022-05-29 DIAGNOSIS — I5030 Unspecified diastolic (congestive) heart failure: Secondary | ICD-10-CM

## 2022-05-29 DIAGNOSIS — Z87891 Personal history of nicotine dependence: Secondary | ICD-10-CM

## 2022-05-29 NOTE — Therapy (Signed)
OUTPATIENT PHYSICAL THERAPY TREATMENT NOTE     Patient Name: Bruce Ball MRN: [id number] DOB:May 22, 1946, 76 y.o., male Today's Date: 05/29/2022  PCP: [name], MD REFERRING PROVIDER: [name], MD   PT End of Session - 05/29/22 1155     Visit Number 19    Number of Visits 77    Date for PT Re-Evaluation 07/06/22    Authorization Type MCR    Progress Note Due on Visit 91    PT Start Time 1146    PT Stop Time 1228    PT Time Calculation (min) 42 min    Activity Tolerance Patient tolerated treatment well    Behavior During Therapy WFL for tasks assessed/performed              Past Medical History:  Diagnosis Date   Ankylosing spondylitis ([place])    Arthritis of right knee    Basal cell carcinoma    Chronic back pain greater than 3 months duration    Chronic leg pain    CKD (chronic kidney disease), stage II    Coronary artery calcification seen on CAT scan 03/27/2021   Deafness in right ear    Dyspnea    cause by PE   Essential tremor    Extrinsic asthma, unspecified 04/07/2009   PT DENIES   GERD (gastroesophageal reflux disease)    Hearing loss    Hip problem    History of kidney stones    HYPERTENSION 10/29/2007   HYPOTENSION 08/13/2010   LOW BACK PAIN 04/21/2008   Mild aortic stenosis    NEPHROLITHIASIS, HX OF 04/21/2008   Neuropathy    Osteopenia    Other hyperlipidemia    Peptic ulcer    Pneumonia    PUD, HX OF 04/21/2008   Pulmonary emboli ([place]) 2021   noted on CT 12-2019   Renal disorder    Stage 3 chronic kidney disease (HCC)    Thyroid nodule    Past Surgical History:  Procedure Laterality Date   BACK SURGERY     bone cancer  1970   rt femur removed and steel rod placed   [place], URETEROSCOPY AND STENT PLACEMENT Right 11/06/2017   Procedure: CYSTOSCOPY WITH  STENT PLACEMENT RIGHT;  Surgeon: Raynelle Bring, MD;  Location: [facility];  Service: Urology;  Laterality: Right;   CYSTOSCOPY WITH RETROGRADE  PYELOGRAM, URETEROSCOPY AND STENT PLACEMENT Left 06/09/2019   Procedure: CYSTOSCOPY WITH RETROGRADE PYELOGRAM, URETEROSCOPY AND STENT PLACEMENT X2;  Surgeon: [name], MD;  Location: [facility];  Service: Urology;  Laterality: Left;   CYSTOSCOPY WITH RETROGRADE PYELOGRAM, URETEROSCOPY AND STENT PLACEMENT Bilateral 03/01/2020   Procedure: CYSTOSCOPY WITH RETROGRADE PYELOGRAM, URETEROSCOPY AND STENT PLACEMENT;  Surgeon: [name], MD;  Location: [facility];  Service: Urology;  Laterality: Bilateral;  75 MINS   CYSTOSCOPY/RETROGRADE/URETEROSCOPY     1 year ago at [place] in [place] Right 11/24/2017   Procedure: CYSTOSCOPY/URETEROSCOPY/ RETROGRADE/STENT REMOVAL;  Surgeon: Raynelle Bring, MD;  Location: [facility];  Service: Urology;  Laterality: Right;   HOLMIUM LASER APPLICATION Bilateral 7/0/2637   Procedure: HOLMIUM LASER APPLICATION;  Surgeon: [name], MD;  Location: [facility];  Service: Urology;  Laterality: Bilateral;   prosatectomy     Patient Active Problem List   Diagnosis Date Noted   History of pulmonary embolism 10/10/2021   HTN (hypertension) 10/10/2021   Chronic diastolic CHF (congestive heart failure) ([place]) 10/10/2021   GERD (gastroesophageal reflux disease) 10/10/2021  Ankylosing spondylitis ([place]) 10/10/2021   CKD (chronic kidney disease), stage III ([place]) 10/10/2021   Hyperlipidemia 10/10/2021   CAD (coronary artery disease) 10/10/2021   Periprosthetic fracture of shaft of femur 10/10/2021   Other fatigue 06/05/2021   CAD (coronary artery disease) 73/22/0254   Diastolic CHF ([place]) 27/05/2375   SOB (shortness of breath) on exertion 03/26/2021   Chronic anticoagulation 03/26/2021   Anemia in chronic kidney disease 01/25/2021   Acquired coagulation disorder ([place]) 12/12/2020   Senile purpura ([place]) 12/12/2020   S/P lumbar fusion 08/03/2020   Thin skin 07/25/2020   Aortic atherosclerosis ([place]) 05/17/2020   B12 deficiency 05/17/2020    Iliac artery aneurysm ([place]) 05/17/2020   Osteoporosis 04/04/2020   Idiopathic neuropathy 01/28/2020   Recurrent falls 01/28/2020   Chronic kidney disease (CKD), stage III (moderate) ([place]) 01/19/2020   Pulmonary emboli ([place]) 01/14/2020   GERD (gastroesophageal reflux disease) 12/22/2019   Essential tremor 12/22/2019   Femoral condyle fracture ([place]) 07/15/2019   Gross hematuria 06/14/2019   Hyperlipidemia 11/11/2017   Kidney stone 11/05/2017   Community acquired pneumonia of right upper lobe of lung 11/05/2017   Right ureteral stone 11/05/2017   History of total right hip replacement 03/08/2017   History of prostate cancer 03/08/2017   Personal history of prostate cancer 02/28/2017   High risk medication use 02/28/2017   DDD (degenerative disc disease), thoracic 02/28/2017   Ataxia 05/18/2016   Vestibular disequilibrium 05/18/2016   Ankylosing spondylitis of cervical region ([place]) 05/24/2014   Lung nodule, solitary 05/24/2014   Lumbosacral stenosis 06/05/2013   Peripheral edema 06/08/2012   Subcortical microvascular ischemic occlusive disease 08/13/2010   Extrinsic asthma 04/07/2009   Backache 04/21/2008   History of peptic ulcer disease 04/21/2008   Essential hypertension 10/29/2007  REFERRING PROVIDER: [name], MD   REFERRING DIAG:  M54.50,G89.29 (ICD-10-CM) - Chronic bilateral low back pain without sciatica  R26.89 (ICD-10-CM) - Other abnormalities of gait and mobility      THERAPY DIAG:  Pain in right hip   Pain in right leg   Difficulty in walking, not elsewhere classified   Muscle weakness (generalized)   ONSET DATE: October 10, 2021 recent fall causing fracture   SUBJECTIVE:                                                                                                                                                                                            SUBJECTIVE STATEMENT: Pt reports that he feels the shot in hip is finally wearing off and he is  making progress again.  He is on a round of prednisone and will finish it  next week.  He plans to bring cane to [facility] for wedding (no RW)     PERTINENT HISTORY:  Stenosis, Rt THA 68 yr ago with h/o periprosthetic fx & revision in 2022, h/o back surgery  Hard of hearing; can hear out of Left ear   PAIN:  Are you having pain? Yes NPRS scale: 5/10 Pain location: Rt ant hip/ groin to knee    Aggravating factors: too much activity Relieving factors: rest   PRECAUTIONS: Posterior hip   WEIGHT BEARING RESTRICTIONS No   FALLS:  Has patient fallen in last 6 months? Yes, Number of falls: 1 4/21: no falls recently     PATIENT GOALS build up strength, get rid of assistance for toileting and getting in/out of bed, no AD in gait, stairs, Son's wedding in [place] first of June     OBJECTIVE: *All objective findings taken at [facility] unless otherwise noted.    PATIENT SURVEYS:  LEFS 39  4/21: 46  5/19: 43     COGNITION:          Overall cognitive status: Within functional limits for tasks assessed                        SENSATION:          Bil hand/feet neuropathy unchanged since evaluation       LE MMT:  5/19 measured with handheld dynamometry Normative values: hip flexion 38 lb, knee extension 79 lb   MMT Right 03/06/2022 Right/Left 04/19/22 Rt/Lt 05/17/22  Hip flexion 4/5 11.1/ 24.3 20.9/42.6  Hip extension      Hip abduction      Hip adduction      Hip internal rotation      Hip external rotation      Knee flexion 3/5 32.3/37.9 37.7/55.3  Knee extension 3/5 12.1/42.4 20.7/60.7  Ankle dorsiflexion      Ankle plantarflexion      Ankle inversion      Ankle eversion       (Blank rows = not tested)       FUNCTIONAL TESTS:  5 times sit to stand: 38s 4/21: 18s without use of UEs Berg Balance Scale: 33/56 4/21: 40/50   GAIT:   Comments: SPC in Lt hand, rt leg abducted and externally rotated, antalgic on Rt with pressure placed through cane 4/21: SPC in Lt hand, Rt  leg no longer abd /ER; does demo Rt trunk SB in Rt stance phase       TODAY'S TREATMENT  5/31 TODAY'S TREATMENT  Pt seen for aquatic therapy today.  Treatment took place in water 3.25-4.8 ft in depth at the [facility] pool. Temp of water was 92.  Pt entered/exited the pool via stairs independently with bilat rail, step-to pattern.   Without UE support:  -Forward walking lunges without support / backward walking lunges with yellow hand buoys -SLS holding yellow hand buoys with horiz abd/add - each leg 20sec - side step with tap behinds  UE on wall to no support - weight shifts R/L with hamstring curls - walking forward kicks at 4 ft -Tandem gait forwards/backwards with support of yellow hand buoys - forward/ backward(in 86f 8" water) - Forward step ups onto blue step with RLE at 467f lateral step ups at 32f2fnd 3.8ft48fsquats at 3ft 40fushing down yellow hand buoy x 15 - TUG like exercise x 3 reps working on speed and even steps - Rt quad stretch with  ankle supported by blue noodle x 30 sec    Pt requires buoyancy for support and to offload joints with strengthening exercises. Viscosity of the water is needed for resistance of strengthening; water current perturbations provides challenge to standing balance unsupported, requiring increased core activation. 5/25: Nu step L7 UE & LE 5 min SLS with opp foot on airex, knee flexed- lateral weight shifts LAQ 3lb Seated march, 3lb on Rt ankle- cues for abdominal engagement Side stepping at bar- neutral pelvis Wide tandem with head turns- abdominal engagement GAIT: progression of Rt hip over RLE with [facility]      PATIENT EDUCATION:  Education details: exercise form/rationale Person educated: Patient and Spouse Education method: Explanation, Demonstration, Tactile cues, and Verbal cues Education comprehension: verbalized understanding, returned demonstration, verbal cues required, tactile cues required, and needs further education      HOME EXERCISE PROGRAM: [id number]   ASSESSMENT:   CLINICAL IMPRESSION: Improved tolerance for step ups, in water less than 4 ft.  Demos decreased R SLS time in water less than 4 ft.  No episodes of increased pain reported today. With increased fatigue, pt demos increased R lateral trunk lean in stance phase.  Good tolerance to standing Rt quad stretch with blue noodle.  Progressing well towards remaining goals.     OBJECTIVE IMPAIRMENTS Abnormal gait, decreased activity tolerance, decreased balance, difficulty walking, decreased strength, impaired sensation, improper body mechanics, postural dysfunction, and pain.    ACTIVITY LIMITATIONS cleaning, community activity, driving, meal prep, laundry, and shopping.    PERSONAL FACTORS  see problem list  are also affecting patient's functional outcome.      REHAB POTENTIAL: Good   CLINICAL DECISION MAKING: Unstable/unpredictable   EVALUATION COMPLEXITY: High     GOALS: Goals reviewed with patient? Yes   SHORT TERM GOALS:   Able to enter/exit pool independently with use of hand rails Baseline: will begin training Target date: 04/03/2022 Goal status: Achieved    2.  Good tolerance to exercise without limitations in next day due to pain Baseline: will establish proper working level Target date: 04/03/2022 Goal status: Ongoing    3.  Able to shift weight to perform step taps with UE assist Baseline: unable at eval Target date: 04/03/2022 Goal status: Achieved    4.  Able to ambulate with centered weight distribution Baseline: at eval- leg abducted and trunk lean Target date: 04/03/2022 Goal status: Ongoing       LONG TERM GOALS:   BERG to improve by MDC of 6 points Baseline: 33 at eval Target date:  07/06/22 Goal status: INITIAL   2.  Pt will ambulate safely around his home wihtout use of AD Baseline: using cane and walker at eval Target date: 07/06/22 Goal status: INITIAL   3.  Able to navigate stairs in home safely and  confidently Baseline: unable at eval Target date: 07/06/22 Goal status: INITIAL   4.  LEFS to improve by MDC Baseline: will retest  at appropriate time Target date: 07/06/22 Goal status: INITIAL   5.  Pt will demo 5TSTS in 20s or less Baseline: 38s at eval Target date: 07/06/22 Goal status: Achieved - 04/19/22   6.  Pt will be able to rid home of aid devices Baseline: using devices such as elevated toilet seat at eval Target date: 07/06/22 Goal status: INITIAL   PLAN: PT FREQUENCY: 1-2x/week   PT DURATION: other: 4 months   PLANNED INTERVENTIONS: Therapeutic exercises, Therapeutic activity, Neuromuscular re-education, Balance training, Gait training, Patient/Family education, Joint  mobilization, Stair training, Aquatic Therapy, Electrical stimulation, Cryotherapy, Moist heat, Taping, and Manual therapy   PLAN FOR NEXT SESSION: balance with head turns. Hip progression in gait without AD  - 20th visit progress note/ assess STG/LTG.     Kerin Perna, PTA 05/29/22 12:31 PM

## 2022-05-31 ENCOUNTER — Ambulatory Visit (HOSPITAL_BASED_OUTPATIENT_CLINIC_OR_DEPARTMENT_OTHER): Payer: Medicare Other | Admitting: Physical Therapy

## 2022-06-05 ENCOUNTER — Ambulatory Visit (HOSPITAL_BASED_OUTPATIENT_CLINIC_OR_DEPARTMENT_OTHER): Payer: Medicare Other | Admitting: Physical Therapy

## 2022-06-06 DIAGNOSIS — I2699 Other pulmonary embolism without acute cor pulmonale: Secondary | ICD-10-CM | POA: Diagnosis not present

## 2022-06-07 ENCOUNTER — Encounter (HOSPITAL_BASED_OUTPATIENT_CLINIC_OR_DEPARTMENT_OTHER): Payer: Self-pay | Admitting: Physical Therapy

## 2022-06-07 ENCOUNTER — Ambulatory Visit (HOSPITAL_BASED_OUTPATIENT_CLINIC_OR_DEPARTMENT_OTHER): Payer: Medicare Other | Attending: Family Medicine | Admitting: Physical Therapy

## 2022-06-07 DIAGNOSIS — R262 Difficulty in walking, not elsewhere classified: Secondary | ICD-10-CM | POA: Diagnosis not present

## 2022-06-07 DIAGNOSIS — M79604 Pain in right leg: Secondary | ICD-10-CM | POA: Insufficient documentation

## 2022-06-07 DIAGNOSIS — M25551 Pain in right hip: Secondary | ICD-10-CM | POA: Insufficient documentation

## 2022-06-07 DIAGNOSIS — M6281 Muscle weakness (generalized): Secondary | ICD-10-CM | POA: Diagnosis not present

## 2022-06-07 NOTE — Therapy (Signed)
OUTPATIENT PHYSICAL THERAPY TREATMENT NOTE     Patient Name: Bruce Ball MRN: [id number] DOB:1946/04/07, 76 y.o., male Today's Date: 06/07/2022  PCP: [name], MD REFERRING PROVIDER: [name], MD   PT End of Session - 06/07/22 1102     Visit Number 20    Number of Visits 69    Date for PT Re-Evaluation 07/06/22    Authorization Type MCR    Progress Note Due on Visit 5    PT Start Time 1100    PT Stop Time 1140    PT Time Calculation (min) 40 min    Activity Tolerance Patient tolerated treatment well    Behavior During Therapy WFL for tasks assessed/performed              Past Medical History:  Diagnosis Date   Ankylosing spondylitis ([place])    Arthritis of right knee    Basal cell carcinoma    Chronic back pain greater than 3 months duration    Chronic leg pain    CKD (chronic kidney disease), stage II    Coronary artery calcification seen on CAT scan 03/27/2021   Deafness in right ear    Dyspnea    cause by PE   Essential tremor    Extrinsic asthma, unspecified 04/07/2009   PT DENIES   GERD (gastroesophageal reflux disease)    Hearing loss    Hip problem    History of kidney stones    HYPERTENSION 10/29/2007   HYPOTENSION 08/13/2010   LOW BACK PAIN 04/21/2008   Mild aortic stenosis    NEPHROLITHIASIS, HX OF 04/21/2008   Neuropathy    Osteopenia    Other hyperlipidemia    Peptic ulcer    Pneumonia    PUD, HX OF 04/21/2008   Pulmonary emboli ([place]) 2021   noted on CT 12-2019   Renal disorder    Stage 3 chronic kidney disease (HCC)    Thyroid nodule    Past Surgical History:  Procedure Laterality Date   BACK SURGERY     bone cancer  1970   rt femur removed and steel rod placed   CYSTOSCOPY WITH RETROGRADE PYELOGRAM, URETEROSCOPY AND STENT PLACEMENT Right 11/06/2017   Procedure: CYSTOSCOPY WITH  STENT PLACEMENT RIGHT;  Surgeon: Raynelle Bring, MD;  Location: [facility];  Service: Urology;  Laterality: Right;   CYSTOSCOPY WITH RETROGRADE  PYELOGRAM, URETEROSCOPY AND STENT PLACEMENT Left 06/09/2019   Procedure: CYSTOSCOPY WITH RETROGRADE PYELOGRAM, URETEROSCOPY AND STENT PLACEMENT X2;  Surgeon: [name], MD;  Location: [facility];  Service: Urology;  Laterality: Left;   CYSTOSCOPY WITH RETROGRADE PYELOGRAM, URETEROSCOPY AND STENT PLACEMENT Bilateral 03/01/2020   Procedure: CYSTOSCOPY WITH RETROGRADE PYELOGRAM, URETEROSCOPY AND STENT PLACEMENT;  Surgeon: [name], MD;  Location: [facility];  Service: Urology;  Laterality: Bilateral;  75 MINS   CYSTOSCOPY/RETROGRADE/URETEROSCOPY     1 year ago at [place] in [place] Right 11/24/2017   Procedure: CYSTOSCOPY/URETEROSCOPY/ RETROGRADE/STENT REMOVAL;  Surgeon: Raynelle Bring, MD;  Location: [facility];  Service: Urology;  Laterality: Right;   HOLMIUM LASER APPLICATION Bilateral 04/02/346   Procedure: HOLMIUM LASER APPLICATION;  Surgeon: [name], MD;  Location: [facility];  Service: Urology;  Laterality: Bilateral;   prosatectomy     Patient Active Problem List   Diagnosis Date Noted   History of pulmonary embolism 10/10/2021   HTN (hypertension) 10/10/2021   Chronic diastolic CHF (congestive heart failure) ([place]) 10/10/2021   GERD (gastroesophageal reflux disease) 10/10/2021  Ankylosing spondylitis ([place]) 10/10/2021   CKD (chronic kidney disease), stage III ([place]) 10/10/2021   Hyperlipidemia 10/10/2021   CAD (coronary artery disease) 10/10/2021   Periprosthetic fracture of shaft of femur 10/10/2021   Other fatigue 06/05/2021   CAD (coronary artery disease) 35/70/1779   Diastolic CHF ([place]) 39/02/91   SOB (shortness of breath) on exertion 03/26/2021   Chronic anticoagulation 03/26/2021   Anemia in chronic kidney disease 01/25/2021   Acquired coagulation disorder ([place]) 12/12/2020   Senile purpura ([place]) 12/12/2020   S/P lumbar fusion 08/03/2020   Thin skin 07/25/2020   Aortic atherosclerosis ([place]) 05/17/2020   B12 deficiency 05/17/2020    Iliac artery aneurysm ([place]) 05/17/2020   Osteoporosis 04/04/2020   Idiopathic neuropathy 01/28/2020   Recurrent falls 01/28/2020   Chronic kidney disease (CKD), stage III (moderate) ([place]) 01/19/2020   Pulmonary emboli ([place]) 01/14/2020   GERD (gastroesophageal reflux disease) 12/22/2019   Essential tremor 12/22/2019   Femoral condyle fracture ([place]) 07/15/2019   Gross hematuria 06/14/2019   Hyperlipidemia 11/11/2017   Kidney stone 11/05/2017   Community acquired pneumonia of right upper lobe of lung 11/05/2017   Right ureteral stone 11/05/2017   History of total right hip replacement 03/08/2017   History of prostate cancer 03/08/2017   Personal history of prostate cancer 02/28/2017   High risk medication use 02/28/2017   DDD (degenerative disc disease), thoracic 02/28/2017   Ataxia 05/18/2016   Vestibular disequilibrium 05/18/2016   Ankylosing spondylitis of cervical region ([place]) 05/24/2014   Lung nodule, solitary 05/24/2014   Lumbosacral stenosis 06/05/2013   Peripheral edema 06/08/2012   Subcortical microvascular ischemic occlusive disease 08/13/2010   Extrinsic asthma 04/07/2009   Backache 04/21/2008   History of peptic ulcer disease 04/21/2008   Essential hypertension 10/29/2007  REFERRING PROVIDER: [name], MD   REFERRING DIAG:  M54.50,G89.29 (ICD-10-CM) - Chronic bilateral low back pain without sciatica  R26.89 (ICD-10-CM) - Other abnormalities of gait and mobility      THERAPY DIAG:  Pain in right hip   Pain in right leg   Difficulty in walking, not elsewhere classified   Muscle weakness (generalized)   ONSET DATE: October 10, 2021 recent fall causing fracture   Progress Note Reporting Period 03/06/22 to 06/07/2022  See note below for Objective Data and Assessment of Progress/Goals.       SUBJECTIVE:                                                                                                                                                                                             SUBJECTIVE STATEMENT: Everything went very well with travel and  the wedding. The only time I use the rollator is for convenience of carrying things. Out of the house I am using a cane but do not use any AD at home.      PERTINENT HISTORY:  Stenosis, Rt THA 71 yr ago with h/o periprosthetic fx & revision in 2022, h/o back surgery  Hard of hearing; can hear out of Left ear   PAIN:  Are you having pain? Yes NPRS scale: 2/10 Pain location: Rt ant hip/ groin to knee    Aggravating factors: too much activity Relieving factors: rest   PRECAUTIONS: Posterior hip   WEIGHT BEARING RESTRICTIONS No   FALLS:  Has patient fallen in last 6 months? Yes, Number of falls: 1 4/21: no falls recently 6/9: denies new falls     PATIENT GOALS build up strength, get rid of assistance for toileting and getting in/out of bed, no AD in gait, stairs, Son's wedding in [place] first of June     OBJECTIVE: *All objective findings taken at [facility] unless otherwise noted.    PATIENT SURVEYS:  LEFS 39 4/21: 46 5/19: 43 6/9: 43     COGNITION:          Overall cognitive status: Within functional limits for tasks assessed                        SENSATION:          Bil hand/feet neuropathy unchanged since evaluation       LE MMT:  6/9 measured with handheld dynamometry Normative values: hip flexion 38 lb, knee extension 79 lb   MMT Right 03/06/2022 Right/Left 04/19/22 Rt/Lt 05/17/22 Rt/Lt 06/07/22  Hip flexion 4/5 11.1/ 24.3 20.9/42.6 34.8/44.2  Knee flexion 3/5 32.3/37.9 37.7/55.3 38.7/48.6  Knee extension 3/5 12.1/42.4 20.7/60.7 25.8/50.5   (Blank rows = not tested)       FUNCTIONAL TESTS:  5 times sit to stand: 38s 4/21: 18s without use of UEs 6/9:16s without use of UEs Berg Balance Scale: 33/56 4/21: 40/50 6/9: 44/50   GAIT:   Comments: SPC in Lt hand, rt leg abducted and externally rotated, antalgic on Rt with pressure placed through cane 4/21: SPC in  Lt hand, Rt leg no longer abd /ER; does demo Rt trunk SB in Rt stance phase 6/9: SPC in Lt hand, mild Rt trunk SB in Rt stance phase, maintains upright posture       TODAY'S TREATMENT  6/9: Nustep 6 min LE only L5 Performance testing Standing fwd/back step with weight shift- holding 5lb like a plate & with target reach opp UE Gait training: with head turns & arm reaches: cues to maintain heel-toe pattern when looking left & reaching with Left UE ( no AD used, CGA with belt- 1 incidence of LOB to right & used wall to correct)  5/31 TODAY'S TREATMENT  Pt seen for aquatic therapy today.  Treatment took place in water 3.25-4.8 ft in depth at the [facility] pool. Temp of water was 92.  Pt entered/exited the pool via stairs independently with bilat rail, step-to pattern.   Without UE support:  -Forward walking lunges without support / backward walking lunges with yellow hand buoys -SLS holding yellow hand buoys with horiz abd/add - each leg 20sec - side step with tap behinds  UE on wall to no support - weight shifts R/L with hamstring curls - walking forward kicks at 4 ft -Tandem gait forwards/backwards with support of yellow hand buoys -  forward/ backward(in 70f 8" water) - Forward step ups onto blue step with RLE at 460f lateral step ups at [id number] at 3ft 67fushing down yellow hand buoy x 15 - TUG like exercise x 3 reps working on speed and even steps - Rt quad stretch with ankle supported by blue noodle x 30 sec    Pt requires buoyancy for support and to offload joints with strengthening exercises. Viscosity of the water is needed for resistance of strengthening; water current perturbations provides challenge to standing balance unsupported, requiring increased core activation. 5/25: Nu step L7 UE & LE 5 min SLS with opp foot on airex, knee flexed- lateral weight shifts LAQ 3lb Seated march, 3lb on Rt ankle- cues for abdominal engagement Side stepping at bar-  neutral pelvis Wide tandem with head turns- abdominal engagement GAIT: progression of Rt hip over RLE with SPC  [facility] PATIENT EDUCATION:  Education details: exercise form/rationale Person educated: Patient and Spouse Education method: Explanation, Demonstration, Tactile cues, and Verbal cues Education comprehension: verbalized understanding, returned demonstration, verbal cues required, tactile cues required, and needs further education     HOME EXERCISE PROGRAM: [id number]   ASSESSMENT:   CLINICAL IMPRESSION: Pt continues to make significant progress in gross strength and function. He has been able to get rid of most assistive devices but would still like to improve unassisted gait- at the rate he has improved, that is completely reasonable.   OBJECTIVE IMPAIRMENTS Abnormal gait, decreased activity tolerance, decreased balance, difficulty walking, decreased strength, impaired sensation, improper body mechanics, postural dysfunction, and pain.    ACTIVITY LIMITATIONS cleaning, community activity, driving, meal prep, laundry, and shopping.    PERSONAL FACTORS  see problem list  are also affecting patient's functional outcome.      REHAB POTENTIAL: Good   CLINICAL DECISION MAKING: Unstable/unpredictable   EVALUATION COMPLEXITY: High     GOALS: Goals reviewed with patient? Yes   SHORT TERM GOALS:   Able to enter/exit pool independently with use of hand rails Baseline: will begin training Target date: 04/03/2022 Goal status: Achieved    2.  Good tolerance to exercise without limitations in next day due to pain Baseline: will establish proper working level Target date: 04/03/2022 Goal status: Ongoing    3.  Able to shift weight to perform step taps with UE assist Baseline: unable at eval Target date: 04/03/2022 Goal status: Achieved    4.  Able to ambulate with centered weight distribution Baseline: at eval- leg abducted and trunk lean Target date: 04/03/2022 Goal status:  Ongoing       LONG TERM GOALS:   BERG to improve by MDC of 6 points Baseline: 33 at eval Target date:  07/06/22 Goal status: achieved   2.  Pt will ambulate safely around his home wihtout use of AD Baseline: achieved Target date: 07/06/22 Goal status: INITIAL   3.  Able to navigate stairs in home safely and confidently Baseline:navigating stairs with "up with the good, down with the bad" pattern Target date: 07/06/22 Goal status: ongoing   4.  LEFS to improve by MDC Baseline: will retest  at appropriate time Target date: 07/06/22 Goal status: INITIAL   5.  Pt will demo 5TSTS in 20s or less Baseline: 38s at eval Target date: 07/06/22 Goal status: Achieved - 04/19/22   6.  Pt will be able to rid home of aid devices Baseline: taken toilet seats out, uses stool in shower but for convenience, not need;  still using walker to carry things Target date: 07/06/22 Goal status: ongoing  7. Start walking around the neighborhood, ultimately without AD  Baseline: has not walked neighborhood since injection  Target date: 07/06/22  Goal status: New   PLAN: PT FREQUENCY: 1-2x/week   PT DURATION: other: 4 months   PLANNED INTERVENTIONS: Therapeutic exercises, Therapeutic activity, Neuromuscular re-education, Balance training, Gait training, Patient/Family education, Joint mobilization, Stair training, Aquatic Therapy, Electrical stimulation, Cryotherapy, Moist heat, Taping, and Manual therapy   PLAN FOR NEXT SESSION: balance with head turns. Hip progression in gait without AD. Balance in shallow water- no arms for support   Judyann Casasola C. Virginia Curl PT, DPT 06/07/22 1:21 PM

## 2022-06-11 DIAGNOSIS — C44319 Basal cell carcinoma of skin of other parts of face: Secondary | ICD-10-CM | POA: Diagnosis not present

## 2022-06-11 DIAGNOSIS — J339 Nasal polyp, unspecified: Secondary | ICD-10-CM | POA: Insufficient documentation

## 2022-06-11 DIAGNOSIS — Z85828 Personal history of other malignant neoplasm of skin: Secondary | ICD-10-CM | POA: Diagnosis not present

## 2022-06-11 DIAGNOSIS — J3089 Other allergic rhinitis: Secondary | ICD-10-CM | POA: Insufficient documentation

## 2022-06-12 ENCOUNTER — Ambulatory Visit (HOSPITAL_BASED_OUTPATIENT_CLINIC_OR_DEPARTMENT_OTHER): Payer: Medicare Other | Admitting: Physical Therapy

## 2022-06-13 NOTE — Therapy (Signed)
OUTPATIENT PHYSICAL THERAPY TREATMENT NOTE     Patient Name: Bruce Ball MRN: [id number] DOB:27-Jun-1946, 76 y.o., male Today's Date: 06/14/2022  PCP: [name], MD REFERRING PROVIDER: [name], MD   PT End of Session - 06/14/22 1101     Visit Number 21    Number of Visits 76    Date for PT Re-Evaluation 07/06/22    Authorization Type MCR    Progress Note Due on Visit 61    PT Start Time 1058    PT Stop Time 1136    PT Time Calculation (min) 38 min    Activity Tolerance Patient tolerated treatment well    Behavior During Therapy WFL for tasks assessed/performed               Past Medical History:  Diagnosis Date   Ankylosing spondylitis ([place])    Arthritis of right knee    Basal cell carcinoma    Chronic back pain greater than 3 months duration    Chronic leg pain    CKD (chronic kidney disease), stage II    Coronary artery calcification seen on CAT scan 03/27/2021   Deafness in right ear    Dyspnea    cause by PE   Essential tremor    Extrinsic asthma, unspecified 04/07/2009   PT DENIES   GERD (gastroesophageal reflux disease)    Hearing loss    Hip problem    History of kidney stones    HYPERTENSION 10/29/2007   HYPOTENSION 08/13/2010   LOW BACK PAIN 04/21/2008   Mild aortic stenosis    NEPHROLITHIASIS, HX OF 04/21/2008   Neuropathy    Osteopenia    Other hyperlipidemia    Peptic ulcer    Pneumonia    PUD, HX OF 04/21/2008   Pulmonary emboli ([place]) 2021   noted on CT 12-2019   Renal disorder    Stage 3 chronic kidney disease (HCC)    Thyroid nodule    Past Surgical History:  Procedure Laterality Date   BACK SURGERY     bone cancer  1970   rt femur removed and steel rod placed   [place], URETEROSCOPY AND STENT PLACEMENT Right 11/06/2017   Procedure: CYSTOSCOPY WITH  STENT PLACEMENT RIGHT;  Surgeon: Raynelle Bring, MD;  Location: [facility];  Service: Urology;  Laterality: Right;   CYSTOSCOPY WITH  RETROGRADE PYELOGRAM, URETEROSCOPY AND STENT PLACEMENT Left 06/09/2019   Procedure: CYSTOSCOPY WITH RETROGRADE PYELOGRAM, URETEROSCOPY AND STENT PLACEMENT X2;  Surgeon: [name], MD;  Location: [facility];  Service: Urology;  Laterality: Left;   CYSTOSCOPY WITH RETROGRADE PYELOGRAM, URETEROSCOPY AND STENT PLACEMENT Bilateral 03/01/2020   Procedure: CYSTOSCOPY WITH RETROGRADE PYELOGRAM, URETEROSCOPY AND STENT PLACEMENT;  Surgeon: [name], MD;  Location: [facility];  Service: Urology;  Laterality: Bilateral;  75 MINS   CYSTOSCOPY/RETROGRADE/URETEROSCOPY     1 year ago at [place] in [place] Right 11/24/2017   Procedure: CYSTOSCOPY/URETEROSCOPY/ RETROGRADE/STENT REMOVAL;  Surgeon: Raynelle Bring, MD;  Location: [facility];  Service: Urology;  Laterality: Right;   HOLMIUM LASER APPLICATION Bilateral 08/02/939   Procedure: HOLMIUM LASER APPLICATION;  Surgeon: [name], MD;  Location: [facility];  Service: Urology;  Laterality: Bilateral;   prosatectomy     Patient Active Problem List   Diagnosis Date Noted   History of pulmonary embolism 10/10/2021   HTN (hypertension) 10/10/2021   Chronic diastolic CHF (congestive heart failure) ([place]) 10/10/2021   GERD (gastroesophageal reflux disease) 10/10/2021  Ankylosing spondylitis ([place]) 10/10/2021   CKD (chronic kidney disease), stage III ([place]) 10/10/2021   Hyperlipidemia 10/10/2021   CAD (coronary artery disease) 10/10/2021   Periprosthetic fracture of shaft of femur 10/10/2021   Other fatigue 06/05/2021   CAD (coronary artery disease) 93/26/7124   Diastolic CHF ([place]) 58/08/9832   SOB (shortness of breath) on exertion 03/26/2021   Chronic anticoagulation 03/26/2021   Anemia in chronic kidney disease 01/25/2021   Acquired coagulation disorder ([place]) 12/12/2020   Senile purpura ([place]) 12/12/2020   S/P lumbar fusion 08/03/2020   Thin skin 07/25/2020   Aortic atherosclerosis ([place]) 05/17/2020   B12 deficiency  05/17/2020   Iliac artery aneurysm ([place]) 05/17/2020   Osteoporosis 04/04/2020   Idiopathic neuropathy 01/28/2020   Recurrent falls 01/28/2020   Chronic kidney disease (CKD), stage III (moderate) ([place]) 01/19/2020   Pulmonary emboli ([place]) 01/14/2020   GERD (gastroesophageal reflux disease) 12/22/2019   Essential tremor 12/22/2019   Femoral condyle fracture ([place]) 07/15/2019   Gross hematuria 06/14/2019   Hyperlipidemia 11/11/2017   Kidney stone 11/05/2017   Community acquired pneumonia of right upper lobe of lung 11/05/2017   Right ureteral stone 11/05/2017   History of total right hip replacement 03/08/2017   History of prostate cancer 03/08/2017   Personal history of prostate cancer 02/28/2017   High risk medication use 02/28/2017   DDD (degenerative disc disease), thoracic 02/28/2017   Ataxia 05/18/2016   Vestibular disequilibrium 05/18/2016   Ankylosing spondylitis of cervical region ([place]) 05/24/2014   Lung nodule, solitary 05/24/2014   Lumbosacral stenosis 06/05/2013   Peripheral edema 06/08/2012   Subcortical microvascular ischemic occlusive disease 08/13/2010   Extrinsic asthma 04/07/2009   Backache 04/21/2008   History of peptic ulcer disease 04/21/2008   Essential hypertension 10/29/2007  REFERRING PROVIDER: [name], MD   REFERRING DIAG:  M54.50,G89.29 (ICD-10-CM) - Chronic bilateral low back pain without sciatica  R26.89 (ICD-10-CM) - Other abnormalities of gait and mobility      THERAPY DIAG:  Pain in right hip   Pain in right leg   Difficulty in walking, not elsewhere classified   Muscle weakness (generalized)   ONSET DATE: October 10, 2021 recent fall causing fracture     SUBJECTIVE:                                                                                                                                                                                            SUBJECTIVE STATEMENT: Doing pretty well.  I walked half of my neighborhood  without the cane.   PERTINENT HISTORY:  Stenosis, Rt THA 10 yr ago with h/o periprosthetic fx &  revision in 2022, h/o back surgery  Hard of hearing; can hear out of Left ear   PAIN:  Are you having pain? Yes NPRS scale: 2/10 Pain location: Rt ant hip/ groin to knee    Aggravating factors: too much activity Relieving factors: rest   PRECAUTIONS: Posterior hip   WEIGHT BEARING RESTRICTIONS No   FALLS:  Has patient fallen in last 6 months? Yes, Number of falls: 1 4/21: no falls recently 6/9: denies new falls     PATIENT GOALS build up strength, get rid of assistance for toileting and getting in/out of bed, no AD in gait, stairs, Son's wedding in [place] first of June     OBJECTIVE: *All objective findings taken at [facility] unless otherwise noted.    PATIENT SURVEYS:  LEFS 39 4/21: 46 5/19: 43 6/9: 43     COGNITION:          Overall cognitive status: Within functional limits for tasks assessed                        SENSATION:          Bil hand/feet neuropathy unchanged since evaluation       LE MMT:  6/9 measured with handheld dynamometry Normative values: hip flexion 38 lb, knee extension 79 lb   MMT Right 03/06/2022 Right/Left 04/19/22 Rt/Lt 05/17/22 Rt/Lt 06/07/22  Hip flexion 4/5 11.1/ 24.3 20.9/42.6 34.8/44.2  Knee flexion 3/5 32.3/37.9 37.7/55.3 38.7/48.6  Knee extension 3/5 12.1/42.4 20.7/60.7 25.8/50.5   (Blank rows = not tested)       FUNCTIONAL TESTS:  5 times sit to stand: 38s 4/21: 18s without use of UEs 6/9:16s without use of UEs Berg Balance Scale: 33/56 4/21: 40/50 6/9: 44/50   GAIT:   Comments: SPC in Lt hand, rt leg abducted and externally rotated, antalgic on Rt with pressure placed through cane 4/21: SPC in Lt hand, Rt leg no longer abd /ER; does demo Rt trunk SB in Rt stance phase 6/9: SPC in Lt hand, mild Rt trunk SB in Rt stance phase, maintains upright posture       TODAY'S TREATMENT  6/16: Nu step 5 min LE only L5 On airex:  weight shift A/P & lateral, alt heel lifts, head turns Seated LAQ+abd press on blue tband  Standing resisted trunk rotation GAIT: trunk rotation without AD to reduce right side bend  6/9: Nustep 6 min LE only L5 Performance testing Standing fwd/back step with weight shift- holding 5lb like a plate & with target reach opp UE Gait training: with head turns & arm reaches: cues to maintain heel-toe pattern when looking left & reaching with Left UE ( no AD used, CGA with belt- 1 incidence of LOB to right & used wall to correct)  5/31 TODAY'S TREATMENT  Pt seen for aquatic therapy today.  Treatment took place in water 3.25-4.8 ft in depth at the [facility] pool. Temp of water was 92.  Pt entered/exited the pool via stairs independently with bilat rail, step-to pattern.   Without UE support:  -Forward walking lunges without support / backward walking lunges with yellow hand buoys -SLS holding yellow hand buoys with horiz abd/add - each leg 20sec - side step with tap behinds  UE on wall to no support - weight shifts R/L with hamstring curls - walking forward kicks at 4 ft -Tandem gait forwards/backwards with support of yellow hand buoys - forward/ backward(in 62f 8" water) - Forward step ups  onto blue step with RLE at 15f; lateral step ups at 427fand [id number] squats at 3ft52fpushing down yellow hand buoy x 15 - TUG like exercise x 3 reps working on speed and even steps - Rt quad stretch with ankle supported by blue noodle x 30 sec    Pt requires buoyancy for support and to offload joints with strengthening exercises. Viscosity of the water is needed for resistance of strengthening; water current perturbations provides challenge to standing balance unsupported, requiring increased core activation.      PATIENT EDUCATION:  Education details: exercise form/rationale Person educated: Patient and Spouse Education method: Explanation, Demonstration, Tactile cues, and Verbal  cues Education comprehension: verbalized understanding, returned demonstration, verbal cues required, tactile cues required, and needs further education     HOME EXERCISE PROGRAM: [id number]   ASSESSMENT:   CLINICAL IMPRESSION: Tactile cues required to shift weight over right LE, denied pain. Asked that he do this at home with a walker and mirror for cues. Demo Rt trunk lean in stance phase when he is not using SPC but does not when he has AD in hand- incr pain today when trying to encourage trunk rotation without AD use. Asked that he continue walking around neighborhood with SPC [facility] endurance training and without cane in pool for proper form, will continue to work on confidence for land ambulation without AD.    OBJECTIVE IMPAIRMENTS Abnormal gait, decreased activity tolerance, decreased balance, difficulty walking, decreased strength, impaired sensation, improper body mechanics, postural dysfunction, and pain.    ACTIVITY LIMITATIONS cleaning, community activity, driving, meal prep, laundry, and shopping.    PERSONAL FACTORS  see problem list  are also affecting patient's functional outcome.      REHAB POTENTIAL: Good   CLINICAL DECISION MAKING: Unstable/unpredictable   EVALUATION COMPLEXITY: High     GOALS: Goals reviewed with patient? Yes   SHORT TERM GOALS:   Able to enter/exit pool independently with use of hand rails Baseline: will begin training Target date: 04/03/2022 Goal status: Achieved    2.  Good tolerance to exercise without limitations in next day due to pain Baseline: will establish proper working level Target date: 04/03/2022 Goal status: Ongoing    3.  Able to shift weight to perform step taps with UE assist Baseline: unable at eval Target date: 04/03/2022 Goal status: Achieved    4.  Able to ambulate with centered weight distribution Baseline: at eval- leg abducted and trunk lean Target date: 04/03/2022 Goal status: Ongoing       LONG TERM GOALS:    BERG to improve by MDC of 6 points Baseline: 33 at eval Target date:  07/06/22 Goal status: achieved   2.  Pt will ambulate safely around his home wihtout use of AD Baseline: achieved Target date: 07/06/22 Goal status: INITIAL   3.  Able to navigate stairs in home safely and confidently Baseline:navigating stairs with "up with the good, down with the bad" pattern Target date: 07/06/22 Goal status: ongoing   4.  LEFS to improve by MDC Baseline: will retest  at appropriate time Target date: 07/06/22 Goal status: INITIAL   5.  Pt will demo 5TSTS in 20s or less Baseline: 38s at eval Target date: 07/06/22 Goal status: Achieved - 04/19/22   6.  Pt will be able to rid home of aid devices Baseline: taken toilet seats out, uses stool in shower but for convenience, not need; still using walker to carry things Target date: 07/06/22 Goal status: ongoing  7. Start walking around the neighborhood, ultimately without AD  Baseline: has not walked neighborhood since injection  Target date: 07/06/22  Goal status: New   PLAN: PT FREQUENCY: 1-2x/week   PT DURATION: other: 4 months   PLANNED INTERVENTIONS: Therapeutic exercises, Therapeutic activity, Neuromuscular re-education, Balance training, Gait training, Patient/Family education, Joint mobilization, Stair training, Aquatic Therapy, Electrical stimulation, Cryotherapy, Moist heat, Taping, and Manual therapy   PLAN FOR NEXT SESSION: balance with head turns. Hip progression in gait without AD. Balance in shallow water- no arms for support   Luane Rochon C. Aliviah Spain PT, DPT 06/14/22 11:38 AM

## 2022-06-14 ENCOUNTER — Ambulatory Visit (HOSPITAL_BASED_OUTPATIENT_CLINIC_OR_DEPARTMENT_OTHER): Payer: Medicare Other | Admitting: Physical Therapy

## 2022-06-14 ENCOUNTER — Encounter (HOSPITAL_BASED_OUTPATIENT_CLINIC_OR_DEPARTMENT_OTHER): Payer: Self-pay | Admitting: Physical Therapy

## 2022-06-14 DIAGNOSIS — M79604 Pain in right leg: Secondary | ICD-10-CM | POA: Diagnosis not present

## 2022-06-14 DIAGNOSIS — M25551 Pain in right hip: Secondary | ICD-10-CM

## 2022-06-14 DIAGNOSIS — R262 Difficulty in walking, not elsewhere classified: Secondary | ICD-10-CM | POA: Diagnosis not present

## 2022-06-14 DIAGNOSIS — M6281 Muscle weakness (generalized): Secondary | ICD-10-CM | POA: Diagnosis not present

## 2022-06-18 DIAGNOSIS — Z4802 Encounter for removal of sutures: Secondary | ICD-10-CM | POA: Diagnosis not present

## 2022-06-19 ENCOUNTER — Ambulatory Visit (HOSPITAL_BASED_OUTPATIENT_CLINIC_OR_DEPARTMENT_OTHER): Payer: Medicare Other | Admitting: Physical Therapy

## 2022-06-19 DIAGNOSIS — M79604 Pain in right leg: Secondary | ICD-10-CM

## 2022-06-19 DIAGNOSIS — R262 Difficulty in walking, not elsewhere classified: Secondary | ICD-10-CM

## 2022-06-19 DIAGNOSIS — M6281 Muscle weakness (generalized): Secondary | ICD-10-CM | POA: Diagnosis not present

## 2022-06-19 DIAGNOSIS — M25551 Pain in right hip: Secondary | ICD-10-CM | POA: Diagnosis not present

## 2022-06-19 NOTE — Therapy (Signed)
OUTPATIENT PHYSICAL THERAPY TREATMENT NOTE     Patient Name: Bruce Ball MRN: [id number] DOB:12/15/1946, 76 y.o., male Today's Date: 06/19/2022  PCP: [name], MD REFERRING PROVIDER: [name], MD   PT End of Session - 06/19/22 1156     Visit Number 22    Number of Visits 76    Date for PT Re-Evaluation 07/06/22    Authorization Type MCR    Progress Note Due on Visit 46    PT Start Time 1147    PT Stop Time 1230    PT Time Calculation (min) 43 min               Past Medical History:  Diagnosis Date   Ankylosing spondylitis ([place])    Arthritis of right knee    Basal cell carcinoma    Chronic back pain greater than 3 months duration    Chronic leg pain    CKD (chronic kidney disease), stage II    Coronary artery calcification seen on CAT scan 03/27/2021   Deafness in right ear    Dyspnea    cause by PE   Essential tremor    Extrinsic asthma, unspecified 04/07/2009   PT DENIES   GERD (gastroesophageal reflux disease)    Hearing loss    Hip problem    History of kidney stones    HYPERTENSION 10/29/2007   HYPOTENSION 08/13/2010   LOW BACK PAIN 04/21/2008   Mild aortic stenosis    NEPHROLITHIASIS, HX OF 04/21/2008   Neuropathy    Osteopenia    Other hyperlipidemia    Peptic ulcer    Pneumonia    PUD, HX OF 04/21/2008   Pulmonary emboli ([place]) 2021   noted on CT 12-2019   Renal disorder    Stage 3 chronic kidney disease (HCC)    Thyroid nodule    Past Surgical History:  Procedure Laterality Date   BACK SURGERY     bone cancer  1970   rt femur removed and steel rod placed   CYSTOSCOPY WITH RETROGRADE PYELOGRAM, URETEROSCOPY AND STENT PLACEMENT Right 11/06/2017   Procedure: CYSTOSCOPY WITH  STENT PLACEMENT RIGHT;  Surgeon: Raynelle Bring, MD;  Location: [facility];  Service: Urology;  Laterality: Right;   CYSTOSCOPY WITH RETROGRADE PYELOGRAM, URETEROSCOPY AND STENT PLACEMENT Left 06/09/2019   Procedure: CYSTOSCOPY WITH RETROGRADE PYELOGRAM,  URETEROSCOPY AND STENT PLACEMENT X2;  Surgeon: [name], MD;  Location: [facility];  Service: Urology;  Laterality: Left;   CYSTOSCOPY WITH RETROGRADE PYELOGRAM, URETEROSCOPY AND STENT PLACEMENT Bilateral 03/01/2020   Procedure: CYSTOSCOPY WITH RETROGRADE PYELOGRAM, URETEROSCOPY AND STENT PLACEMENT;  Surgeon: [name], MD;  Location: [facility];  Service: Urology;  Laterality: Bilateral;  75 MINS   CYSTOSCOPY/RETROGRADE/URETEROSCOPY     1 year ago at [place] in [place] Right 11/24/2017   Procedure: CYSTOSCOPY/URETEROSCOPY/ RETROGRADE/STENT REMOVAL;  Surgeon: Raynelle Bring, MD;  Location: [facility];  Service: Urology;  Laterality: Right;   HOLMIUM LASER APPLICATION Bilateral 05/02/6502   Procedure: HOLMIUM LASER APPLICATION;  Surgeon: [name], MD;  Location: [facility];  Service: Urology;  Laterality: Bilateral;   prosatectomy     Patient Active Problem List   Diagnosis Date Noted   History of pulmonary embolism 10/10/2021   HTN (hypertension) 10/10/2021   Chronic diastolic CHF (congestive heart failure) ([place]) 10/10/2021   GERD (gastroesophageal reflux disease) 10/10/2021   Ankylosing spondylitis ([place]) 10/10/2021   CKD (chronic kidney disease), stage III ([place]) 10/10/2021   Hyperlipidemia 10/10/2021  CAD (coronary artery disease) 10/10/2021   Periprosthetic fracture of shaft of femur 10/10/2021   Other fatigue 06/05/2021   CAD (coronary artery disease) 97/35/3299   Diastolic CHF ([place]) 24/26/8341   SOB (shortness of breath) on exertion 03/26/2021   Chronic anticoagulation 03/26/2021   Anemia in chronic kidney disease 01/25/2021   Acquired coagulation disorder ([place]) 12/12/2020   Senile purpura ([place]) 12/12/2020   S/P lumbar fusion 08/03/2020   Thin skin 07/25/2020   Aortic atherosclerosis ([place]) 05/17/2020   B12 deficiency 05/17/2020   Iliac artery aneurysm ([place]) 05/17/2020   Osteoporosis 04/04/2020   Idiopathic neuropathy 01/28/2020    Recurrent falls 01/28/2020   Chronic kidney disease (CKD), stage III (moderate) ([place]) 01/19/2020   Pulmonary emboli ([place]) 01/14/2020   GERD (gastroesophageal reflux disease) 12/22/2019   Essential tremor 12/22/2019   Femoral condyle fracture ([place]) 07/15/2019   Gross hematuria 06/14/2019   Hyperlipidemia 11/11/2017   Kidney stone 11/05/2017   Community acquired pneumonia of right upper lobe of lung 11/05/2017   Right ureteral stone 11/05/2017   History of total right hip replacement 03/08/2017   History of prostate cancer 03/08/2017   Personal history of prostate cancer 02/28/2017   High risk medication use 02/28/2017   DDD (degenerative disc disease), thoracic 02/28/2017   Ataxia 05/18/2016   Vestibular disequilibrium 05/18/2016   Ankylosing spondylitis of cervical region ([place]) 05/24/2014   Lung nodule, solitary 05/24/2014   Lumbosacral stenosis 06/05/2013   Peripheral edema 06/08/2012   Subcortical microvascular ischemic occlusive disease 08/13/2010   Extrinsic asthma 04/07/2009   Backache 04/21/2008   History of peptic ulcer disease 04/21/2008   Essential hypertension 10/29/2007  REFERRING PROVIDER: [name], MD   REFERRING DIAG:  M54.50,G89.29 (ICD-10-CM) - Chronic bilateral low back pain without sciatica  R26.89 (ICD-10-CM) - Other abnormalities of gait and mobility      THERAPY DIAG:  Pain in right hip   Pain in right leg   Difficulty in walking, not elsewhere classified   Muscle weakness (generalized)   ONSET DATE: October 10, 2021 recent fall causing fracture     SUBJECTIVE:                                                                                                                                                                                            SUBJECTIVE STATEMENT: Pt reports he has had increased back pain; he reports he thinks it is a kidney stone.  He hasn't tried walking neighborhood again due to rain.    PERTINENT HISTORY:   Stenosis, Rt THA 62 yr ago with h/o periprosthetic fx & revision in 2022, h/o back  surgery  Hard of hearing; can hear out of Left ear   PAIN:  Are you having pain? Yes NPRS scale: 8/10 Pain location: Rt midback   Aggravating factors: leaning Rt Relieving factors: rest   PRECAUTIONS: Posterior hip   WEIGHT BEARING RESTRICTIONS No   FALLS:  Has patient fallen in last 6 months? Yes, Number of falls: 1 4/21: no falls recently 6/9: denies new falls     PATIENT GOALS build up strength, get rid of assistance for toileting and getting in/out of bed, no AD in gait, stairs, Son's wedding in [place] first of June     OBJECTIVE: *All objective findings taken at [facility] unless otherwise noted.    PATIENT SURVEYS:  LEFS 39 4/21: 46 5/19: 43 6/9: 43     COGNITION:          Overall cognitive status: Within functional limits for tasks assessed                        SENSATION:          Bil hand/feet neuropathy unchanged since evaluation       LE MMT:  6/9 measured with handheld dynamometry Normative values: hip flexion 38 lb, knee extension 79 lb   MMT Right 03/06/2022 Right/Left 04/19/22 Rt/Lt 05/17/22 Rt/Lt 06/07/22  Hip flexion 4/5 11.1/ 24.3 20.9/42.6 34.8/44.2  Knee flexion 3/5 32.3/37.9 37.7/55.3 38.7/48.6  Knee extension 3/5 12.1/42.4 20.7/60.7 25.8/50.5   (Blank rows = not tested)       FUNCTIONAL TESTS:  5 times sit to stand: 38s 4/21: 18s without use of UEs 6/9:16s without use of UEs Berg Balance Scale: 33/56 4/21: 40/50 6/9: 44/50   GAIT:   Comments: SPC in Lt hand, rt leg abducted and externally rotated, antalgic on Rt with pressure placed through cane 4/21: SPC in Lt hand, Rt leg no longer abd /ER; does demo Rt trunk SB in Rt stance phase 6/9: SPC in Lt hand, mild Rt trunk SB in Rt stance phase, maintains upright posture        TODAY'S TREATMENT  6/21: Pt seen for aquatic therapy today.  Treatment took place in water 3.25-4 ft 8 in depth at the  [facility] pool. Temp of water was 92.  Pt entered/exited the pool via stairs independently with bilat rail, step-to pattern.   Without UE support:  Forward/ backward walking in 43f, 8" water Side stepping with shoulder abdct/ add with yellow hand buoys Forward walking lunges without support / backward walking lunges with yellow hand buoys SLS holding yellow hand buoys with horiz abd/add - each leg 20sec SLS holding yellow hand buoys with hip IR/ER x 10 each LE side step with tap behinds , to curtsy lunges holding yellow hand buoys Forward step ups onto blue step with RLE at 3 ft 8", Rt lateral step ups  with UE support at same depth x 10 each  Tandem gait forwards/backwards with support of yellow hand buoys - forward/ backward(in 436fwater) walking forward kicks at 4 ft At shallow end by stairs:, tandem stance R/L (horiz head turns with LLE in back) -intermittent UE to steady; Alternating toe taps to 2nd step with intermittent UE to steady; squats without support x 5    Pt requires buoyancy for support and to offload joints with strengthening exercises. Viscosity of the water is needed for resistance of strengthening; water current perturbations provides challenge to standing balance unsupported, requiring increased core activation.  6/16: Nu step  5 min LE only L5 On airex: weight shift A/P & lateral, alt heel lifts, head turns Seated LAQ+abd press on blue tband  Standing resisted trunk rotation GAIT: trunk rotation without AD to reduce right side bend  6/9: Nustep 6 min LE only L5 Performance testing Standing fwd/back step with weight shift- holding 5lb like a plate & with target reach opp UE Gait training: with head turns & arm reaches: cues to maintain heel-toe pattern when looking left & reaching with Left UE ( no AD used, CGA with belt- 1 incidence of LOB to right & used wall to correct)      PATIENT EDUCATION:  Education details: exercise form/rationale Person  educated: Patient and Spouse Education method: Explanation, Demonstration, Tactile cues, and Verbal cues Education comprehension: verbalized understanding, returned demonstration, verbal cues required, tactile cues required, and needs further education     HOME EXERCISE PROGRAM: [id number]   ASSESSMENT:   CLINICAL IMPRESSION: Pt able to ascend / descend 5.5" step in more shallow water with less difficulty than last aquatic session in May.  He had 1 episode of increased pain in back, and one episode of increased Rt hip pain while in shallow water; resolved with rest.  Otherwise he was able to complete aquatic exercises without any pain.  He demonstrated decreased balance with alternating toe taps to 2nd step at end of session, likely due to fatigue. Progressing well towards remaining goals.    OBJECTIVE IMPAIRMENTS Abnormal gait, decreased activity tolerance, decreased balance, difficulty walking, decreased strength, impaired sensation, improper body mechanics, postural dysfunction, and pain.    ACTIVITY LIMITATIONS cleaning, community activity, driving, meal prep, laundry, and shopping.    PERSONAL FACTORS  see problem list  are also affecting patient's functional outcome.      REHAB POTENTIAL: Good   CLINICAL DECISION MAKING: Unstable/unpredictable   EVALUATION COMPLEXITY: High     GOALS: Goals reviewed with patient? Yes   SHORT TERM GOALS:   Able to enter/exit pool independently with use of hand rails Baseline: will begin training Target date: 04/03/2022 Goal status: Achieved    2.  Good tolerance to exercise without limitations in next day due to pain Baseline: will establish proper working level Target date: 04/03/2022 Goal status: Ongoing    3.  Able to shift weight to perform step taps with UE assist Baseline: unable at eval Target date: 04/03/2022 Goal status: Achieved    4.  Able to ambulate with centered weight distribution Baseline: at eval- leg abducted and trunk  lean Target date: 04/03/2022 Goal status: Ongoing       LONG TERM GOALS:   BERG to improve by MDC of 6 points Baseline: 33 at eval Target date:  07/06/22 Goal status: achieved   2.  Pt will ambulate safely around his home wihtout use of AD Baseline: achieved Target date: 07/06/22 Goal status: INITIAL   3.  Able to navigate stairs in home safely and confidently Baseline:navigating stairs with "up with the good, down with the bad" pattern Target date: 07/06/22 Goal status: ongoing   4.  LEFS to improve by MDC Baseline: will retest  at appropriate time Target date: 07/06/22 Goal status: INITIAL   5.  Pt will demo 5TSTS in 20s or less Baseline: 38s at eval Target date: 07/06/22 Goal status: Achieved - 04/19/22   6.  Pt will be able to rid home of aid devices Baseline: taken toilet seats out, uses stool in shower but for convenience, not need; still using walker to  carry things Target date: 07/06/22 Goal status: ongoing  7. Start walking around the neighborhood, ultimately without AD  Baseline: has not walked neighborhood since injection  Target date: 07/06/22  Goal status: New   PLAN: PT FREQUENCY: 1-2x/week   PT DURATION: other: 4 months   PLANNED INTERVENTIONS: Therapeutic exercises, Therapeutic activity, Neuromuscular re-education, Balance training, Gait training, Patient/Family education, Joint mobilization, Stair training, Aquatic Therapy, Electrical stimulation, Cryotherapy, Moist heat, Taping, and Manual therapy   PLAN FOR NEXT SESSION: balance with head turns. Hip progression in gait without AD. Balance in shallow water- no arms for support   Kerin Perna, PTA 06/19/22 1:13 PM

## 2022-06-21 ENCOUNTER — Ambulatory Visit (HOSPITAL_BASED_OUTPATIENT_CLINIC_OR_DEPARTMENT_OTHER): Payer: Medicare Other | Admitting: Physical Therapy

## 2022-06-21 ENCOUNTER — Encounter (HOSPITAL_BASED_OUTPATIENT_CLINIC_OR_DEPARTMENT_OTHER): Payer: Self-pay | Admitting: Physical Therapy

## 2022-06-21 DIAGNOSIS — R262 Difficulty in walking, not elsewhere classified: Secondary | ICD-10-CM

## 2022-06-21 DIAGNOSIS — M6281 Muscle weakness (generalized): Secondary | ICD-10-CM | POA: Diagnosis not present

## 2022-06-21 DIAGNOSIS — M79604 Pain in right leg: Secondary | ICD-10-CM

## 2022-06-21 DIAGNOSIS — M25551 Pain in right hip: Secondary | ICD-10-CM | POA: Diagnosis not present

## 2022-06-25 ENCOUNTER — Other Ambulatory Visit: Payer: Self-pay | Admitting: Endocrinology

## 2022-06-26 ENCOUNTER — Ambulatory Visit (HOSPITAL_BASED_OUTPATIENT_CLINIC_OR_DEPARTMENT_OTHER): Payer: Medicare Other | Admitting: Physical Therapy

## 2022-06-26 ENCOUNTER — Encounter (HOSPITAL_BASED_OUTPATIENT_CLINIC_OR_DEPARTMENT_OTHER): Payer: Self-pay | Admitting: Physical Therapy

## 2022-06-26 DIAGNOSIS — M25551 Pain in right hip: Secondary | ICD-10-CM | POA: Diagnosis not present

## 2022-06-26 DIAGNOSIS — M79604 Pain in right leg: Secondary | ICD-10-CM | POA: Diagnosis not present

## 2022-06-26 DIAGNOSIS — M6281 Muscle weakness (generalized): Secondary | ICD-10-CM

## 2022-06-26 DIAGNOSIS — R262 Difficulty in walking, not elsewhere classified: Secondary | ICD-10-CM | POA: Diagnosis not present

## 2022-06-26 NOTE — Therapy (Signed)
OUTPATIENT PHYSICAL THERAPY TREATMENT NOTE     Patient Name: Bruce Ball MRN: [id number] DOB:1946-07-06, 76 y.o., male Today's Date: 06/26/2022  PCP: [name], MD REFERRING PROVIDER: [name], MD   PT End of Session - 06/26/22 1156     Visit Number 24    Number of Visits 47    Date for PT Re-Evaluation 07/06/22    Authorization Type MCR    Progress Note Due on Visit 41    PT Start Time 1150    PT Stop Time 1228    PT Time Calculation (min) 38 min    Activity Tolerance Patient tolerated treatment well    Behavior During Therapy WFL for tasks assessed/performed               Past Medical History:  Diagnosis Date   Ankylosing spondylitis ([place])    Arthritis of right knee    Basal cell carcinoma    Chronic back pain greater than 3 months duration    Chronic leg pain    CKD (chronic kidney disease), stage II    Coronary artery calcification seen on CAT scan 03/27/2021   Deafness in right ear    Dyspnea    cause by PE   Essential tremor    Extrinsic asthma, unspecified 04/07/2009   PT DENIES   GERD (gastroesophageal reflux disease)    Hearing loss    Hip problem    History of kidney stones    HYPERTENSION 10/29/2007   HYPOTENSION 08/13/2010   LOW BACK PAIN 04/21/2008   Mild aortic stenosis    NEPHROLITHIASIS, HX OF 04/21/2008   Neuropathy    Osteopenia    Other hyperlipidemia    Peptic ulcer    Pneumonia    PUD, HX OF 04/21/2008   Pulmonary emboli ([place]) 2021   noted on CT 12-2019   Renal disorder    Stage 3 chronic kidney disease (HCC)    Thyroid nodule    Past Surgical History:  Procedure Laterality Date   BACK SURGERY     bone cancer  1970   rt femur removed and steel rod placed   [place], URETEROSCOPY AND STENT PLACEMENT Right 11/06/2017   Procedure: CYSTOSCOPY WITH  STENT PLACEMENT RIGHT;  Surgeon: Raynelle Bring, MD;  Location: [facility];  Service: Urology;  Laterality: Right;   CYSTOSCOPY WITH  RETROGRADE PYELOGRAM, URETEROSCOPY AND STENT PLACEMENT Left 06/09/2019   Procedure: CYSTOSCOPY WITH RETROGRADE PYELOGRAM, URETEROSCOPY AND STENT PLACEMENT X2;  Surgeon: [name], MD;  Location: [facility];  Service: Urology;  Laterality: Left;   CYSTOSCOPY WITH RETROGRADE PYELOGRAM, URETEROSCOPY AND STENT PLACEMENT Bilateral 03/01/2020   Procedure: CYSTOSCOPY WITH RETROGRADE PYELOGRAM, URETEROSCOPY AND STENT PLACEMENT;  Surgeon: [name], MD;  Location: [facility];  Service: Urology;  Laterality: Bilateral;  75 MINS   CYSTOSCOPY/RETROGRADE/URETEROSCOPY     1 year ago at [place] in [place] Right 11/24/2017   Procedure: CYSTOSCOPY/URETEROSCOPY/ RETROGRADE/STENT REMOVAL;  Surgeon: Raynelle Bring, MD;  Location: [facility];  Service: Urology;  Laterality: Right;   HOLMIUM LASER APPLICATION Bilateral 12/31/4578   Procedure: HOLMIUM LASER APPLICATION;  Surgeon: [name], MD;  Location: [facility];  Service: Urology;  Laterality: Bilateral;   prosatectomy     Patient Active Problem List   Diagnosis Date Noted   History of pulmonary embolism 10/10/2021   HTN (hypertension) 10/10/2021   Chronic diastolic CHF (congestive heart failure) ([place]) 10/10/2021   GERD (gastroesophageal reflux disease) 10/10/2021  Ankylosing spondylitis ([place]) 10/10/2021   CKD (chronic kidney disease), stage III ([place]) 10/10/2021   Hyperlipidemia 10/10/2021   CAD (coronary artery disease) 10/10/2021   Periprosthetic fracture of shaft of femur 10/10/2021   Other fatigue 06/05/2021   CAD (coronary artery disease) 75/09/2584   Diastolic CHF ([place]) 27/78/2423   SOB (shortness of breath) on exertion 03/26/2021   Chronic anticoagulation 03/26/2021   Anemia in chronic kidney disease 01/25/2021   Acquired coagulation disorder ([place]) 12/12/2020   Senile purpura ([place]) 12/12/2020   S/P lumbar fusion 08/03/2020   Thin skin 07/25/2020   Aortic atherosclerosis ([place]) 05/17/2020   B12 deficiency  05/17/2020   Iliac artery aneurysm ([place]) 05/17/2020   Osteoporosis 04/04/2020   Idiopathic neuropathy 01/28/2020   Recurrent falls 01/28/2020   Chronic kidney disease (CKD), stage III (moderate) ([place]) 01/19/2020   Pulmonary emboli ([place]) 01/14/2020   GERD (gastroesophageal reflux disease) 12/22/2019   Essential tremor 12/22/2019   Femoral condyle fracture ([place]) 07/15/2019   Gross hematuria 06/14/2019   Hyperlipidemia 11/11/2017   Kidney stone 11/05/2017   Community acquired pneumonia of right upper lobe of lung 11/05/2017   Right ureteral stone 11/05/2017   History of total right hip replacement 03/08/2017   History of prostate cancer 03/08/2017   Personal history of prostate cancer 02/28/2017   High risk medication use 02/28/2017   DDD (degenerative disc disease), thoracic 02/28/2017   Ataxia 05/18/2016   Vestibular disequilibrium 05/18/2016   Ankylosing spondylitis of cervical region ([place]) 05/24/2014   Lung nodule, solitary 05/24/2014   Lumbosacral stenosis 06/05/2013   Peripheral edema 06/08/2012   Subcortical microvascular ischemic occlusive disease 08/13/2010   Extrinsic asthma 04/07/2009   Backache 04/21/2008   History of peptic ulcer disease 04/21/2008   Essential hypertension 10/29/2007  REFERRING PROVIDER: [name], MD   REFERRING DIAG:  M54.50,G89.29 (ICD-10-CM) - Chronic bilateral low back pain without sciatica  R26.89 (ICD-10-CM) - Other abnormalities of gait and mobility      THERAPY DIAG:  Pain in right hip   Pain in right leg   Difficulty in walking, not elsewhere classified   Muscle weakness (generalized)   ONSET DATE: October 10, 2021 recent fall causing fracture     SUBJECTIVE:                                                                                                                                                                                            SUBJECTIVE STATEMENT: Pt reports he continues with back pain.  Hasn't passed  a kidney stone yet, but back hurts when moving certain ways.  He returned to pool [place], [place].  Sinuses are bothering him today.    PERTINENT HISTORY:  Stenosis, Rt THA 93 yr ago with h/o periprosthetic fx & revision in 2022, h/o back surgery  Hard of hearing; can hear out of Left ear   PAIN:  Are you having pain? Yes NPRS scale: 7/10 Pain location: Rt hip/groin   Aggravating factors: Rt WB Relieving factors: rest   PRECAUTIONS: Posterior hip   WEIGHT BEARING RESTRICTIONS No   FALLS:  Has patient fallen in last 6 months? Yes, Number of falls: 1 4/21: no falls recently 6/9: denies new falls     PATIENT GOALS build up strength, get rid of assistance for toileting and getting in/out of bed, no AD in gait, stairs, Son's wedding in [place] first of June     OBJECTIVE: *All objective findings taken at [facility] unless otherwise noted.    PATIENT SURVEYS:  LEFS 39 4/21: 46 5/19: 43 6/9: 43     COGNITION:          Overall cognitive status: Within functional limits for tasks assessed                        SENSATION:          Bil hand/feet neuropathy unchanged since evaluation       LE MMT:  6/9 measured with handheld dynamometry Normative values: hip flexion 38 lb, knee extension 79 lb   MMT Right 03/06/2022 Right/Left 04/19/22 Rt/Lt 05/17/22 Rt/Lt 06/07/22  Hip flexion 4/5 11.1/ 24.3 20.9/42.6 34.8/44.2  Knee flexion 3/5 32.3/37.9 37.7/55.3 38.7/48.6  Knee extension 3/5 12.1/42.4 20.7/60.7 25.8/50.5   (Blank rows = not tested)  6/23:unable to lift RtLE against gravity in sidelying       FUNCTIONAL TESTS:  5 times sit to stand: 38s 4/21: 18s without use of UEs 6/9:16s without use of UEs Berg Balance Scale: 33/56 4/21: 40/50 6/9: 44/50   GAIT:   Comments: SPC in Lt hand, rt leg abducted and externally rotated, antalgic on Rt with pressure placed through cane 4/21: SPC in Lt hand, Rt leg no longer abd /ER; does demo Rt trunk SB in Rt stance phase 6/9: SPC in Lt hand,  mild Rt trunk SB in Rt stance phase, maintains upright posture        TODAY'S TREATMENT  6/28: Pt seen for aquatic therapy today.  Treatment took place in water 3.25-4 ft 8 in depth at the [facility] pool. Temp of water was 92.  Pt entered/exited the pool via stairs independently with bilat rail, step-to pattern.   Without UE support:  Forward/ backward walking in 64f, 8" water Side stepping with shoulder abdct/ add with yellow hand buoys walking forward kicks at 4 ft (some pain in Rt knee) Standing R/L quad stretch with ankle supported by sqoodle Repeated forward walking kicks at 4+ ft (no change in R knee pain) Forward walking lunges with support with yellow hand buoys Side to side lunges with yellow hand buoys Curtsy's with yellow hand buoys SLS without UE support at 4 ft (up to 10 sec RLE) Tandem gait forward to shallow end Tandem stance @ 3 ft (up to 15sec with RLE in back) Rt forward step ups x 5 (heavy UE support on rails) Alternating toe taps to 2nd step with cues for control of Lt foot to/from step R/L hamstring stretch with foot on 2nd step R SLS at 346f- up to 4 sec without support Squats in 3 ft Hurdle motion walking in  4+ ft    Pt requires buoyancy for support and to offload joints with strengthening exercises. Viscosity of the water is needed for resistance of strengthening; water current perturbations provides challenge to standing balance unsupported, requiring increased core activation.  6/23 MANUAL: STM Rt QL and glut med/TFL in Lt sidelying over pillow Nu step 4 min L7 UE & LE Standing Lt UE green tband adduction and extension from 90 to neutral Fwd and lateral weight shift with Rt foot on airex, chair for UE support Sidelying clams 3x15 Reverse clam with ball bw knees  6/21: Pt seen for aquatic therapy today.  Treatment took place in water 3.25-4 ft 8 in depth at the [facility] pool. Temp of water was 92.  Pt entered/exited the pool via  stairs independently with bilat rail, step-to pattern.   Without UE support:  Forward/ backward walking in 70f, 8" water Side stepping with shoulder abdct/ add with yellow hand buoys Forward walking lunges without support / backward walking lunges with yellow hand buoys SLS holding yellow hand buoys with horiz abd/add - each leg 20sec SLS holding yellow hand buoys with hip IR/ER x 10 each LE side step with tap behinds , to curtsy lunges holding yellow hand buoys Forward step ups onto blue step with RLE at 3 ft 8", Rt lateral step ups  with UE support at same depth x 10 each  Tandem gait forwards/backwards with support of yellow hand buoys - forward/ backward(in 476fwater) walking forward kicks at 4 ft At shallow end by stairs:, tandem stance R/L (horiz head turns with LLE in back) -intermittent UE to steady; Alternating toe taps to 2nd step with intermittent UE to steady; squats without support x 5    Pt requires buoyancy for support and to offload joints with strengthening exercises. Viscosity of the water is needed for resistance of strengthening; water current perturbations provides challenge to standing balance unsupported, requiring increased core activation.  6/16: Nu step 5 min LE only L5 On airex: weight shift A/P & lateral, alt heel lifts, head turns Seated LAQ+abd press on blue tband  Standing resisted trunk rotation GAIT: trunk rotation without AD to reduce right side bend  6/9: Nustep 6 min LE only L5 Performance testing Standing fwd/back step with weight shift- holding 5lb like a plate & with target reach opp UE Gait training: with head turns & arm reaches: cues to maintain heel-toe pattern when looking left & reaching with Left UE ( no AD used, CGA with belt- 1 incidence of LOB to right & used wall to correct)      PATIENT EDUCATION:  Education details: exercise form/rationale Person educated: Patient and Spouse Education method: Explanation, Demonstration, Tactile  cues, and Verbal cues Education comprehension: verbalized understanding, returned demonstration, verbal cues required, tactile cues required, and needs further education     HOME EXERCISE PROGRAM: [id number]   ASSESSMENT:   CLINICAL IMPRESSION: Pt reported increased Rt hip/knee pain with forward step ups; require heavy use of UE on rails.  His SLS on RLE improving; able to stand 4 sec at 3 ft of water, up to 10 sec at 4 ft of water.  Decrease in LBP during water exercises.  Pt making great progress towards LTGs.     OBJECTIVE IMPAIRMENTS Abnormal gait, decreased activity tolerance, decreased balance, difficulty walking, decreased strength, impaired sensation, improper body mechanics, postural dysfunction, and pain.    ACTIVITY LIMITATIONS cleaning, community activity, driving, meal prep, laundry, and shopping.    PERSONAL FACTORS  see problem list  are also affecting patient's functional outcome.      REHAB POTENTIAL: Good   CLINICAL DECISION MAKING: Unstable/unpredictable   EVALUATION COMPLEXITY: High     GOALS: Goals reviewed with patient? Yes   SHORT TERM GOALS:   Able to enter/exit pool independently with use of hand rails Baseline: will begin training Target date: 04/03/2022 Goal status: Achieved    2.  Good tolerance to exercise without limitations in next day due to pain Baseline: will establish proper working level Target date: 04/03/2022 Goal status: Ongoing    3.  Able to shift weight to perform step taps with UE assist Baseline: unable at eval Target date: 04/03/2022 Goal status: Achieved    4.  Able to ambulate with centered weight distribution Baseline: at eval- leg abducted and trunk lean Target date: 04/03/2022 Goal status: Ongoing       LONG TERM GOALS:   BERG to improve by MDC of 6 points Baseline: 33 at eval Target date:  07/06/22 Goal status: achieved   2.  Pt will ambulate safely around his home wihtout use of AD Baseline: achieved Target date:  07/06/22 Goal status: INITIAL   3.  Able to navigate stairs in home safely and confidently Baseline:navigating stairs with "up with the good, down with the bad" pattern Target date: 07/06/22 Goal status: ongoing   4.  LEFS to improve by MDC Baseline: will retest  at appropriate time Target date: 07/06/22 Goal status: INITIAL   5.  Pt will demo 5TSTS in 20s or less Baseline: 38s at eval Target date: 07/06/22 Goal status: Achieved - 04/19/22   6.  Pt will be able to rid home of aid devices Baseline: taken toilet seats out, uses stool in shower but for convenience, not need; still using walker to carry things Target date: 07/06/22 Goal status: ongoing  7. Start walking around the neighborhood, ultimately without AD  Baseline: has not walked neighborhood since injection  Target date: 07/06/22  Goal status: New   PLAN: PT FREQUENCY: 1-2x/week   PT DURATION: other: 4 months   PLANNED INTERVENTIONS: Therapeutic exercises, Therapeutic activity, Neuromuscular re-education, Balance training, Gait training, Patient/Family education, Joint mobilization, Stair training, Aquatic Therapy, Electrical stimulation, Cryotherapy, Moist heat, Taping, and Manual therapy   PLAN FOR NEXT SESSION: balance with head turns. Hip progression in gait without AD. Balance in shallow water- no arms for support  Kerin Perna, PTA 06/26/22 12:38 PM

## 2022-06-28 ENCOUNTER — Encounter (HOSPITAL_BASED_OUTPATIENT_CLINIC_OR_DEPARTMENT_OTHER): Payer: Self-pay | Admitting: Physical Therapy

## 2022-06-28 ENCOUNTER — Ambulatory Visit (HOSPITAL_BASED_OUTPATIENT_CLINIC_OR_DEPARTMENT_OTHER): Payer: Medicare Other | Admitting: Physical Therapy

## 2022-06-28 DIAGNOSIS — R262 Difficulty in walking, not elsewhere classified: Secondary | ICD-10-CM | POA: Diagnosis not present

## 2022-06-28 DIAGNOSIS — M25551 Pain in right hip: Secondary | ICD-10-CM | POA: Diagnosis not present

## 2022-06-28 DIAGNOSIS — M6281 Muscle weakness (generalized): Secondary | ICD-10-CM

## 2022-06-28 DIAGNOSIS — M79604 Pain in right leg: Secondary | ICD-10-CM | POA: Diagnosis not present

## 2022-06-28 NOTE — Therapy (Signed)
OUTPATIENT PHYSICAL THERAPY TREATMENT NOTE     Patient Name: Bruce Ball MRN: [id number] DOB:[birth year], 76 y.o., male Today's Date: 06/28/2022  PCP: [name], MD REFERRING PROVIDER: [name], MD       Past Medical History:  Diagnosis Date   Ankylosing spondylitis [facility])    Arthritis of right knee    Basal cell carcinoma    Chronic back pain greater than 3 months duration    Chronic leg pain    CKD (chronic kidney disease), stage II    Coronary artery calcification seen on CAT scan 03/27/2021   Deafness in right ear    Dyspnea    cause by PE   Essential tremor    Extrinsic asthma, unspecified 04/07/2009   PT DENIES   GERD (gastroesophageal reflux disease)    Hearing loss    Hip problem    History of kidney stones    HYPERTENSION 10/29/2007   HYPOTENSION 08/13/2010   LOW BACK PAIN 04/21/2008   Mild aortic stenosis    NEPHROLITHIASIS, HX OF 04/21/2008   Neuropathy    Osteopenia    Other hyperlipidemia    Peptic ulcer    Pneumonia    PUD, HX OF 04/21/2008   Pulmonary emboli ([place]) 2021   noted on CT 12-2019   Renal disorder    Stage 3 chronic kidney disease (HCC)    Thyroid nodule    Past Surgical History:  Procedure Laterality Date   BACK SURGERY     bone cancer  1970   rt femur removed and steel rod placed   [place], URETEROSCOPY AND STENT PLACEMENT Right 11/06/2017   Procedure: CYSTOSCOPY WITH  STENT PLACEMENT RIGHT;  Surgeon: Raynelle Bring, MD;  Location: [facility];  Service: Urology;  Laterality: Right;   CYSTOSCOPY WITH RETROGRADE PYELOGRAM, URETEROSCOPY AND STENT PLACEMENT Left 06/09/2019   Procedure: CYSTOSCOPY WITH RETROGRADE PYELOGRAM, URETEROSCOPY AND STENT PLACEMENT X2;  Surgeon: [name], MD;  Location: [facility];  Service: Urology;  Laterality: Left;   CYSTOSCOPY WITH RETROGRADE PYELOGRAM, URETEROSCOPY AND STENT PLACEMENT Bilateral 03/01/2020   Procedure: CYSTOSCOPY WITH RETROGRADE PYELOGRAM,  URETEROSCOPY AND STENT PLACEMENT;  Surgeon: [name], MD;  Location: [facility];  Service: Urology;  Laterality: Bilateral;  75 MINS   CYSTOSCOPY/RETROGRADE/URETEROSCOPY     1 year ago at [place] in [place] Right 11/24/2017   Procedure: CYSTOSCOPY/URETEROSCOPY/ RETROGRADE/STENT REMOVAL;  Surgeon: Raynelle Bring, MD;  Location: [facility];  Service: Urology;  Laterality: Right;   HOLMIUM LASER APPLICATION Bilateral 03/06/9023   Procedure: HOLMIUM LASER APPLICATION;  Surgeon: [name], MD;  Location: [facility];  Service: Urology;  Laterality: Bilateral;   prosatectomy     Patient Active Problem List   Diagnosis Date Noted   History of pulmonary embolism 10/10/2021   HTN (hypertension) 10/10/2021   Chronic diastolic CHF (congestive heart failure) ([place]) 10/10/2021   GERD (gastroesophageal reflux disease) 10/10/2021   Ankylosing spondylitis ([place]) 10/10/2021   CKD (chronic kidney disease), stage III ([place]) 10/10/2021   Hyperlipidemia 10/10/2021   CAD (coronary artery disease) 10/10/2021   Periprosthetic fracture of shaft of femur 10/10/2021   Other fatigue 06/05/2021   CAD (coronary artery disease) 09/73/5329   Diastolic CHF ([place]) 92/42/6834   SOB (shortness of breath) on exertion 03/26/2021   Chronic anticoagulation 03/26/2021   Anemia in chronic kidney disease 01/25/2021   Acquired coagulation disorder ([place]) 12/12/2020   Senile purpura ([place]) 12/12/2020   S/P lumbar fusion 08/03/2020  Thin skin 07/25/2020   Aortic atherosclerosis ([place]) 05/17/2020   B12 deficiency 05/17/2020   Iliac artery aneurysm ([place]) 05/17/2020   Osteoporosis 04/04/2020   Idiopathic neuropathy 01/28/2020   Recurrent falls 01/28/2020   Chronic kidney disease (CKD), stage III (moderate) (HCC) 01/19/2020   Pulmonary emboli ([place]) 01/14/2020   GERD (gastroesophageal reflux disease) 12/22/2019   Essential tremor 12/22/2019   Femoral condyle fracture ([place]) 07/15/2019   Gross  hematuria 06/14/2019   Hyperlipidemia 11/11/2017   Kidney stone 11/05/2017   Community acquired pneumonia of right upper lobe of lung 11/05/2017   Right ureteral stone 11/05/2017   History of total right hip replacement 03/08/2017   History of prostate cancer 03/08/2017   Personal history of prostate cancer 02/28/2017   High risk medication use 02/28/2017   DDD (degenerative disc disease), thoracic 02/28/2017   Ataxia 05/18/2016   Vestibular disequilibrium 05/18/2016   Ankylosing spondylitis of cervical region ([place]) 05/24/2014   Lung nodule, solitary 05/24/2014   Lumbosacral stenosis 06/05/2013   Peripheral edema 06/08/2012   Subcortical microvascular ischemic occlusive disease 08/13/2010   Extrinsic asthma 04/07/2009   Backache 04/21/2008   History of peptic ulcer disease 04/21/2008   Essential hypertension 10/29/2007  REFERRING PROVIDER: [name], MD   REFERRING DIAG:  M54.50,G89.29 (ICD-10-CM) - Chronic bilateral low back pain without sciatica  R26.89 (ICD-10-CM) - Other abnormalities of gait and mobility      THERAPY DIAG:  Pain in right hip   Pain in right leg   Difficulty in walking, not elsewhere classified   Muscle weakness (generalized)   ONSET DATE: October 10, 2021 recent fall causing fracture     SUBJECTIVE:                                                                                                                                                                                            SUBJECTIVE STATEMENT: Pt reports he feels like he is making steady progress. [facility] He said his hip bursitis has been causing some more pain in lateral hip.   PERTINENT HISTORY:  Stenosis, Rt THA 67 yr ago with h/o periprosthetic fx & revision in 2022, h/o back surgery  Hard of hearing; can hear out of Left ear   PAIN:  Are you having pain? Yes NPRS scale: 7/10 Pain location: Rt hip/groin   Aggravating  factors: Rt WB Relieving factors: rest   PRECAUTIONS: Posterior hip   WEIGHT BEARING RESTRICTIONS No   FALLS:  Has patient fallen in last 6 months? Yes, Number of falls: 1 4/21: no falls recently 6/9:  denies new falls     PATIENT GOALS build up strength, get rid of assistance for toileting and getting in/out of bed, no AD in gait, stairs, Son's wedding in [place] first of June     OBJECTIVE: *All objective findings taken at [facility] unless otherwise noted.    PATIENT SURVEYS:  LEFS 39 4/21: 46 5/19: 43 6/9: 43     COGNITION:          Overall cognitive status: Within functional limits for tasks assessed                        SENSATION:          Bil hand/feet neuropathy unchanged since evaluation       LE MMT:  6/9 measured with handheld dynamometry Normative values: hip flexion 38 lb, knee extension 79 lb   MMT Right 03/06/2022 Right/Left 04/19/22 Rt/Lt 05/17/22 Rt/Lt 06/07/22  Hip flexion 4/5 11.1/ 24.3 20.9/42.6 34.8/44.2  Knee flexion 3/5 32.3/37.9 37.7/55.3 38.7/48.6  Knee extension 3/5 12.1/42.4 20.7/60.7 25.8/50.5   (Blank rows = not tested)  6/23:unable to lift RtLE against gravity in sidelying       FUNCTIONAL TESTS:  5 times sit to stand: 38s 4/21: 18s without use of UEs 6/9:16s without use of UEs Berg Balance Scale: 33/56 4/21: 40/50 6/9: 44/50   GAIT:   Comments: SPC in Lt hand, rt leg abducted and externally rotated, antalgic on Rt with pressure placed through cane 4/21: SPC in Lt hand, Rt leg no longer abd /ER; does demo Rt trunk SB in Rt stance phase 6/9: SPC in Lt hand, mild Rt trunk SB in Rt stance phase, maintains upright posture        TODAY'S TREATMENT   6/30 Nu step 38mn L5 LAQ 3x10 5lb Step ups 4 inch 2x6 Toe taps 4 inch 2x10 Balance -Semi tandem 2x20sec bil -Narrow base 2x20sec eyes closed Seated clamshell red 2x15 no pain noted.    6/28: Pt seen for aquatic therapy today.  Treatment took place in water 3.25-4 ft 8 in depth  [facility] Temp of [facility] was 92.  Pt entered/exited the pool via stairs independently with bilat rail, step-to pattern.   Without UE support:  Forward/ backward walking in 466f 8" water Side stepping with shoulder abdct/ add with yellow hand buoys walking forward kicks at 4 ft (some pain in Rt knee) Standing R/L quad stretch with ankle supported by sqoodle Repeated forward walking kicks at 4+ ft (no change in R knee pain) Forward walking lunges with support with yellow hand buoys Side to side lunges with yellow hand buoys Curtsy's with yellow hand buoys SLS without UE support at 4 ft (up to 10 sec RLE) Tandem gait forward to shallow end Tandem stance @ 3 ft (up to 15sec with RLE in back) Rt forward step ups x 5 (heavy UE support on rails) Alternating toe taps to 2nd step with cues for control of Lt foot to/from step R/L hamstring stretch with foot on 2nd step R SLS at [id number] up to 4 sec without support Squats in 3 ft Hurdle motion walking in 4+ ft    Pt requires buoyancy for support and to offload joints with strengthening exercises. Viscosity of the water is needed for resistance of strengthening; water current perturbations provides challenge to standing balance unsupported, requiring increased core activation.  6/23 MANUAL: STM Rt QL and glut med/TFL in Lt sidelying over pillow Nu step  4 min L7 UE & LE Standing Lt UE green tband adduction and extension from 90 to neutral Fwd and lateral weight shift with Rt foot on airex, chair for UE support Sidelying clams 3x15 Reverse clam with ball bw knees  6/21: Pt seen for aquatic therapy today.  Treatment took place in water 3.25-4 ft 8 in depth at the [facility] pool. Temp of water was 92.  Pt entered/exited the pool via stairs independently with bilat rail, step-to pattern.   Without UE support:  Forward/ backward walking in 37f, 8" water Side stepping with shoulder abdct/ add with yellow hand  buoys Forward walking lunges without support / backward walking lunges with yellow hand buoys SLS holding yellow hand buoys with horiz abd/add - each leg 20sec SLS holding yellow hand buoys with hip IR/ER x 10 each LE side step with tap behinds , to curtsy lunges holding yellow hand buoys Forward step ups onto blue step with RLE at 3 ft 8", Rt lateral step ups  with UE support at same depth x 10 each  Tandem gait forwards/backwards with support of yellow hand buoys - forward/ backward(in 421fwater) walking forward kicks at 4 ft At shallow end by stairs:, tandem stance R/L (horiz head turns with LLE in back) -intermittent UE to steady; Alternating toe taps to 2nd step with intermittent UE to steady; squats without support x 5    Pt requires buoyancy for support and to offload joints with strengthening exercises. Viscosity of the water is needed for resistance of strengthening; water current perturbations provides challenge to standing balance unsupported, requiring increased core activation.  6/16: Nu step 5 min LE only L5 On airex: weight shift A/P & lateral, alt heel lifts, head turns Seated LAQ+abd press on blue tband  Standing resisted trunk rotation GAIT: trunk rotation without AD to reduce right side bend  6/9: Nustep 6 min LE only L5 Performance testing Standing fwd/back step with weight shift- holding 5lb like a plate & with target reach opp UE Gait training: with head turns & arm reaches: cues to maintain heel-toe pattern when looking left & reaching with Left UE ( no AD used, CGA with belt- 1 incidence of LOB to right & used wall to correct)      PATIENT EDUCATION:  Education details: exercise form/rationale Person educated: Patient and Spouse Education method: Explanation, Demonstration, Tactile cues, and Verbal cues Education comprehension: verbalized understanding, returned demonstration, verbal cues required, tactile cues required, and needs further education     HOME  EXERCISE PROGRAM: [id number]   ASSESSMENT:   CLINICAL IMPRESSION: Pt tolerated treatment well today. He reported no increase in pain. He required close guarding for balance but had no major loss in balance. Therapy will continue to progress's as tolerated. The patient worked on laBuilding services engineerxercises. E reports he has mostly been working on the balance exercises in the pool. Therapy will continue to progress as tolerated.    OBJECTIVE IMPAIRMENTS Abnormal gait, decreased activity tolerance, decreased balance, difficulty walking, decreased strength, impaired sensation, improper body mechanics, postural dysfunction, and pain.    ACTIVITY LIMITATIONS cleaning, community activity, driving, meal prep, laundry, and shopping.    PERSONAL FACTORS  see problem list  are also affecting patient's functional outcome.      REHAB POTENTIAL: Good   CLINICAL DECISION MAKING: Unstable/unpredictable   EVALUATION COMPLEXITY: High     GOALS: Goals reviewed with patient? Yes   SHORT TERM GOALS:   Able to enter/exit pool independently with use  of hand rails Baseline: will begin training Target date: 04/03/2022 Goal status: Achieved    2.  Good tolerance to exercise without limitations in next day due to pain Baseline: will establish proper working level Target date: 04/03/2022 Goal status: Ongoing    3.  Able to shift weight to perform step taps with UE assist Baseline: unable at eval Target date: 04/03/2022 Goal status: Achieved    4.  Able to ambulate with centered weight distribution Baseline: at eval- leg abducted and trunk lean Target date: 04/03/2022 Goal status: Ongoing       LONG TERM GOALS:   BERG to improve by MDC of 6 points Baseline: 33 at eval Target date:  07/06/22 Goal status: achieved   2.  Pt will ambulate safely around his home wihtout use of AD Baseline: achieved Target date: 07/06/22 Goal status: INITIAL   3.  Able to navigate stairs in home safely and  confidently Baseline:navigating stairs with "up with the good, down with the bad" pattern Target date: 07/06/22 Goal status: ongoing   4.  LEFS to improve by MDC Baseline: will retest  at appropriate time Target date: 07/06/22 Goal status: INITIAL   5.  Pt will demo 5TSTS in 20s or less Baseline: 38s at eval Target date: 07/06/22 Goal status: Achieved - 04/19/22   6.  Pt will be able to rid home of aid devices Baseline: taken toilet seats out, uses stool in shower but for convenience, not need; still using walker to carry things Target date: 07/06/22 Goal status: ongoing  7. Start walking around the neighborhood, ultimately without AD  Baseline: has not walked neighborhood since injection  Target date: 07/06/22  Goal status: New   PLAN: PT FREQUENCY: 1-2x/week   PT DURATION: other: 4 months   PLANNED INTERVENTIONS: Therapeutic exercises, Therapeutic activity, Neuromuscular re-education, Balance training, Gait training, Patient/Family education, Joint mobilization, Stair training, Aquatic Therapy, Electrical stimulation, Cryotherapy, Moist heat, Taping, and Manual therapy   PLAN FOR NEXT SESSION: balance with head turns. Hip progression in gait without AD. Balance in shallow water- no arms for support  Carolyne Littles PT DPT  06/28/22 10:59 AM

## 2022-07-03 ENCOUNTER — Ambulatory Visit (HOSPITAL_BASED_OUTPATIENT_CLINIC_OR_DEPARTMENT_OTHER): Payer: Medicare Other | Attending: Family Medicine | Admitting: Physical Therapy

## 2022-07-03 ENCOUNTER — Encounter (HOSPITAL_BASED_OUTPATIENT_CLINIC_OR_DEPARTMENT_OTHER): Payer: Self-pay | Admitting: Physical Therapy

## 2022-07-03 DIAGNOSIS — R262 Difficulty in walking, not elsewhere classified: Secondary | ICD-10-CM | POA: Diagnosis not present

## 2022-07-03 DIAGNOSIS — M79604 Pain in right leg: Secondary | ICD-10-CM | POA: Insufficient documentation

## 2022-07-03 DIAGNOSIS — M6281 Muscle weakness (generalized): Secondary | ICD-10-CM | POA: Insufficient documentation

## 2022-07-03 DIAGNOSIS — M25551 Pain in right hip: Secondary | ICD-10-CM | POA: Diagnosis not present

## 2022-07-03 NOTE — Therapy (Signed)
OUTPATIENT PHYSICAL THERAPY TREATMENT NOTE     Patient Name: Bruce Ball MRN: [id number] DOB:[birth year], 76 y.o., male Today's Date: 07/03/2022  PCP: [name], MD REFERRING PROVIDER: [name], MD   PT End of Session - 07/03/22 1203     Visit Number 26    Number of Visits 73    Date for PT Re-Evaluation 07/06/22    Authorization Type MCR    Progress Note Due on Visit 48    PT Start Time 1158    PT Stop Time 1240    PT Time Calculation (min) 42 min    Activity Tolerance Patient tolerated treatment well    Behavior During Therapy WFL for tasks assessed/performed                Past Medical History:  Diagnosis Date   Ankylosing spondylitis ([place])    Arthritis of right knee    Basal cell carcinoma    Chronic back pain greater than 3 months duration    Chronic leg pain    CKD (chronic kidney disease), stage II    Coronary artery calcification seen on CAT scan 03/27/2021   Deafness in right ear    Dyspnea    cause by PE   Essential tremor    Extrinsic asthma, unspecified 04/07/2009   PT DENIES   GERD (gastroesophageal reflux disease)    Hearing loss    Hip problem    History of kidney stones    HYPERTENSION 10/29/2007   HYPOTENSION 08/13/2010   LOW BACK PAIN 04/21/2008   Mild aortic stenosis    NEPHROLITHIASIS, HX OF 04/21/2008   Neuropathy    Osteopenia    Other hyperlipidemia    Peptic ulcer    Pneumonia    PUD, HX OF 04/21/2008   Pulmonary emboli ([place]) 2021   noted on CT 12-2019   Renal disorder    Stage 3 chronic kidney disease (HCC)    Thyroid nodule    Past Surgical History:  Procedure Laterality Date   BACK SURGERY     bone cancer  1970   rt femur removed and steel rod placed   CYSTOSCOPY WITH RETROGRADE PYELOGRAM, URETEROSCOPY AND STENT PLACEMENT Right 11/06/2017   Procedure: CYSTOSCOPY WITH  STENT PLACEMENT RIGHT;  Surgeon: Raynelle Bring, MD;  Location: [facility];  Service: Urology;  Laterality: Right;   CYSTOSCOPY WITH  RETROGRADE PYELOGRAM, URETEROSCOPY AND STENT PLACEMENT Left 06/09/2019   Procedure: CYSTOSCOPY WITH RETROGRADE PYELOGRAM, URETEROSCOPY AND STENT PLACEMENT X2;  Surgeon: [name], MD;  Location: [facility];  Service: Urology;  Laterality: Left;   CYSTOSCOPY WITH RETROGRADE PYELOGRAM, URETEROSCOPY AND STENT PLACEMENT Bilateral 03/01/2020   Procedure: CYSTOSCOPY WITH RETROGRADE PYELOGRAM, URETEROSCOPY AND STENT PLACEMENT;  Surgeon: [name], MD;  Location: [facility];  Service: Urology;  Laterality: Bilateral;  75 MINS   CYSTOSCOPY/RETROGRADE/URETEROSCOPY     1 year ago at [place] in [place] Right 11/24/2017   Procedure: CYSTOSCOPY/URETEROSCOPY/ RETROGRADE/STENT REMOVAL;  Surgeon: Raynelle Bring, MD;  Location: [facility];  Service: Urology;  Laterality: Right;   HOLMIUM LASER APPLICATION Bilateral 08/04/7618   Procedure: HOLMIUM LASER APPLICATION;  Surgeon: [name], MD;  Location: [facility];  Service: Urology;  Laterality: Bilateral;   prosatectomy     Patient Active Problem List   Diagnosis Date Noted   History of pulmonary embolism 10/10/2021   HTN (hypertension) 10/10/2021   Chronic diastolic CHF (congestive heart failure) ([place]) 10/10/2021   GERD (gastroesophageal reflux disease) 10/10/2021  Ankylosing spondylitis ([place]) 10/10/2021   CKD (chronic kidney disease), stage III ([place]) 10/10/2021   Hyperlipidemia 10/10/2021   CAD (coronary artery disease) 10/10/2021   Periprosthetic fracture of shaft of femur 10/10/2021   Other fatigue 06/05/2021   CAD (coronary artery disease) 25/04/3975   Diastolic CHF ([place]) 73/41/9379   SOB (shortness of breath) on exertion 03/26/2021   Chronic anticoagulation 03/26/2021   Anemia in chronic kidney disease 01/25/2021   Acquired coagulation disorder ([place]) 12/12/2020   Senile purpura ([place]) 12/12/2020   S/P lumbar fusion 08/03/2020   Thin skin 07/25/2020   Aortic atherosclerosis ([place]) 05/17/2020   B12 deficiency  05/17/2020   Iliac artery aneurysm ([place]) 05/17/2020   Osteoporosis 04/04/2020   Idiopathic neuropathy 01/28/2020   Recurrent falls 01/28/2020   Chronic kidney disease (CKD), stage III (moderate) ([place]) 01/19/2020   Pulmonary emboli ([place]) 01/14/2020   GERD (gastroesophageal reflux disease) 12/22/2019   Essential tremor 12/22/2019   Femoral condyle fracture ([place]) 07/15/2019   Gross hematuria 06/14/2019   Hyperlipidemia 11/11/2017   Kidney stone 11/05/2017   Community acquired pneumonia of right upper lobe of lung 11/05/2017   Right ureteral stone 11/05/2017   History of total right hip replacement 03/08/2017   History of prostate cancer 03/08/2017   Personal history of prostate cancer 02/28/2017   High risk medication use 02/28/2017   DDD (degenerative disc disease), thoracic 02/28/2017   Ataxia 05/18/2016   Vestibular disequilibrium 05/18/2016   Ankylosing spondylitis of cervical region ([place]) 05/24/2014   Lung nodule, solitary 05/24/2014   Lumbosacral stenosis 06/05/2013   Peripheral edema 06/08/2012   Subcortical microvascular ischemic occlusive disease 08/13/2010   Extrinsic asthma 04/07/2009   Backache 04/21/2008   History of peptic ulcer disease 04/21/2008   Essential hypertension 10/29/2007  REFERRING PROVIDER: [name], MD   REFERRING DIAG:  M54.50,G89.29 (ICD-10-CM) - Chronic bilateral low back pain without sciatica  R26.89 (ICD-10-CM) - Other abnormalities of gait and mobility      THERAPY DIAG:  Pain in right hip   Pain in right leg   Difficulty in walking, not elsewhere classified   Muscle weakness (generalized)   ONSET DATE: October 10, 2021 recent fall causing fracture     SUBJECTIVE:                                                                                                                                                                                            SUBJECTIVE STATEMENT: Pt reports he did water exercise at [facility] on Monday and  the water was cold.  His pain in RLE increased, and kept increasing.  "I had to take  an Oxy yesterday"  (Pain yesterday was 8-9/10).   Yesterday (holiday), he only walked around house.    PERTINENT HISTORY:  Stenosis, Rt THA 5 yr ago with h/o periprosthetic fx & revision in 2022, h/o back surgery  Hard of hearing; can hear out of Left ear   PAIN:  Are you having pain? Yes NPRS scale: 4/10 Pain location: Rt hip/groin, low back    Aggravating factors: Rt WB Relieving factors: rest   PRECAUTIONS: Posterior hip   WEIGHT BEARING RESTRICTIONS No   FALLS:  Has patient fallen in last 6 months? Yes, Number of falls: 1 4/21: no falls recently 6/9: denies new falls     PATIENT GOALS build up strength, get rid of assistance for toileting and getting in/out of bed, no AD in gait, stairs, Son's wedding in [place] first of June     OBJECTIVE: *All objective findings taken at [facility] unless otherwise noted.    PATIENT SURVEYS:  LEFS 39 4/21: 46 5/19: 43 6/9: 43     COGNITION:          Overall cognitive status: Within functional limits for tasks assessed                        SENSATION:          Bil hand/feet neuropathy unchanged since evaluation       LE MMT:  6/9 measured with handheld dynamometry Normative values: hip flexion 38 lb, knee extension 79 lb   MMT Right 03/06/2022 Right/Left 04/19/22 Rt/Lt 05/17/22 Rt/Lt 06/07/22  Hip flexion 4/5 11.1/ 24.3 20.9/42.6 34.8/44.2  Knee flexion 3/5 32.3/37.9 37.7/55.3 38.7/48.6  Knee extension 3/5 12.1/42.4 20.7/60.7 25.8/50.5   (Blank rows = not tested)  6/23:unable to lift RtLE against gravity in sidelying       FUNCTIONAL TESTS:  5 times sit to stand: 38s 4/21: 18s without use of UEs 6/9:16s without use of UEs Berg Balance Scale: 33/56 4/21: 40/50 6/9: 44/50   GAIT:   Comments: SPC in Lt hand, rt leg abducted and externally rotated, antalgic on Rt with pressure placed through cane 4/21: SPC in Lt hand, Rt leg no longer  abd /ER; does demo Rt trunk SB in Rt stance phase 6/9: SPC in Lt hand, mild Rt trunk SB in Rt stance phase, maintains upright posture        TODAY'S TREATMENT  7/5  Pt seen for aquatic therapy today.  Treatment took place in water 3.25-4 ft 8 in depth at the [facility] pool. Temp of water was 92.  Pt entered/exited the pool via stairs independently with bilat rail, step-to pattern.   Without UE support:  Forward/ backward walking in 16f, 8" water Side stepping with shoulder abdct/ add with yellow hand buoys Forward/backward  walking lunges with yellow hand buoys In 3 ft- opp arm / leg march, pausing in SLS x 5 each Hurdle walk forward in 413f8" water Warrior 3 in water, (SLS with forward lean, UE on yellow noodle Monster walk forward / backward  Wood chop in staggered stance in 4 ft water STS at bench x 5 Supported by noodle and yellow hand buoys: bicycle and hip abdct / add  R/L hamstring stretch with foot on 2nd step   Pt requires buoyancy for support and to offload joints with strengthening exercises. Viscosity of the water is needed for resistance of strengthening; water current perturbations provides challenge to standing balance unsupported, requiring increased core activation.  6/30 Nu step 91mn L5 LAQ 3x10 5lb Step ups 4 inch 2x6 Toe taps 4 inch 2x10 Balance -Semi tandem 2x20sec bil -Narrow base 2x20sec eyes closed Seated clamshell red 2x15 no pain noted.    6/28: Pt seen for aquatic therapy today.  Treatment took place in [facility] 3.25-4 ft [facility] Temp of [facility] was 92.  Pt entered/exited the pool via stairs independently with bilat rail, step-to pattern.   Without UE support:  Forward/ backward walking in 411f 8" water Side stepping with shoulder abdct/ add with yellow hand buoys walking forward kicks at 4 ft (some pain in Rt knee) Standing R/L quad stretch with ankle supported by sqoodle Repeated forward walking  kicks at 4+ ft (no change in R knee pain) Forward walking lunges with support with yellow hand buoys Side to side lunges with yellow hand buoys Curtsy's with yellow hand buoys SLS without UE support at 4 ft (up to 10 sec RLE) Tandem gait forward to shallow end Tandem stance @ 3 ft (up to 15sec with RLE in back) Rt forward step ups x 5 (heavy UE support on rails) Alternating toe taps to 2nd step with cues for control of Lt foot to/from step R/L hamstring stretch with foot on 2nd step R SLS at 79f5f up to 4 sec without support Squats in 3 ft Hurdle motion walking in 4+ ft    Pt requires buoyancy for support and to offload joints with strengthening exercises. Viscosity of the water is needed for resistance of strengthening; water current perturbations provides challenge to standing balance unsupported, requiring increased core activation.  6/23 MANUAL: STM Rt QL and glut med/TFL in Lt sidelying over pillow Nu step 4 min L7 UE & LE Standing Lt UE green tband adduction and extension from 90 to neutral Fwd and lateral weight shift with Rt foot on airex, chair for UE support Sidelying clams 3x15 Reverse clam with ball bw knees  6/21: Pt seen for aquatic therapy today.  Treatment took place in water 3.25-4 ft 8 [facility] Temp of water was 92.  Pt entered/exited the pool via stairs independently with bilat rail, step-to pattern.   Without UE support:  Forward/ backward walking in 4ft179f" water Side stepping with shoulder abdct/ add with yellow hand buoys Forward walking lunges without support / backward walking lunges with yellow hand buoys SLS holding yellow hand buoys with horiz abd/add - each leg 20sec SLS holding yellow hand buoys with hip IR/ER x 10 each LE side step with tap behinds , to curtsy lunges holding yellow hand buoys Forward step ups onto blue step with RLE at 3 ft 8", Rt lateral step ups  with UE support at same depth x 10 each  Tandem gait  forwards/backwards with support of yellow hand buoys - forward/ backward(in 4ft 44fer) walking forward kicks at 4 ft At shallow end by stairs:, tandem stance R/L (horiz head turns with LLE in back) -intermittent UE to steady; Alternating toe taps to 2nd step with intermittent UE to steady; squats without support x 5    Pt requires buoyancy for support and to offload joints with strengthening exercises. Viscosity of the water is needed for resistance of strengthening; water current perturbations provides challenge to standing balance unsupported, requiring increased core activation.  6/16: Nu step 5 min LE only L5 On airex: weight shift A/P & lateral, alt heel lifts, head turns Seated LAQ+abd press on blue  tband  Standing resisted trunk rotation GAIT: trunk rotation without AD to reduce right side bend  6/9: Nustep 6 min LE only L5 Performance testing Standing fwd/back step with weight shift- holding 5lb like a plate & with target reach opp UE Gait training: with head turns & arm reaches: cues to maintain heel-toe pattern when looking left & reaching with Left UE ( no AD used, CGA with belt- 1 incidence of LOB to right & used wall to correct)      PATIENT EDUCATION:  Education details: exercise form/rationale Person educated: Patient and Spouse Education method: Explanation, Demonstration, Tactile cues, and Verbal cues Education comprehension: verbalized understanding, returned demonstration, verbal cues required, tactile cues required, and needs further education     HOME EXERCISE PROGRAM: [id number]   ASSESSMENT:   CLINICAL IMPRESSION: Pt initially tolerated exercises in deeper water well. Once he walked towards shallow area at bench, his Rt hip became more painful and he became more unsteady.  He reported reduction of pain once he returned to deep area and utilized support of noodle / buoys for cycling like exercise. The patient worked on Building services engineer exercises. Therapy will  continue to progress as tolerated.    OBJECTIVE IMPAIRMENTS Abnormal gait, decreased activity tolerance, decreased balance, difficulty walking, decreased strength, impaired sensation, improper body mechanics, postural dysfunction, and pain.    ACTIVITY LIMITATIONS cleaning, community activity, driving, meal prep, laundry, and shopping.    PERSONAL FACTORS  see problem list  are also affecting patient's functional outcome.      REHAB POTENTIAL: Good   CLINICAL DECISION MAKING: Unstable/unpredictable   EVALUATION COMPLEXITY: High     GOALS: Goals reviewed with patient? Yes   SHORT TERM GOALS:   Able to enter/exit pool independently with use of hand rails Baseline: will begin training Target date: 04/03/2022 Goal status: Achieved    2.  Good tolerance to exercise without limitations in next day due to pain Baseline: will establish proper working level Target date: 04/03/2022 Goal status: Ongoing    3.  Able to shift weight to perform step taps with UE assist Baseline: unable at eval Target date: 04/03/2022 Goal status: Achieved    4.  Able to ambulate with centered weight distribution Baseline: at eval- leg abducted and trunk lean Target date: 04/03/2022 Goal status: Ongoing       LONG TERM GOALS:   BERG to improve by MDC of 6 points Baseline: 33 at eval Target date:  07/06/22 Goal status: achieved   2.  Pt will ambulate safely around his home wihtout use of AD Baseline: achieved Target date: 07/06/22 Goal status: INITIAL   3.  Able to navigate stairs in home safely and confidently Baseline:navigating stairs with "up with the good, down with the bad" pattern Target date: 07/06/22 Goal status: ongoing   4.  LEFS to improve by MDC Baseline: will retest  at appropriate time Target date: 07/06/22 Goal status: INITIAL   5.  Pt will demo 5TSTS in 20s or less Baseline: 38s at eval Target date: 07/06/22 Goal status: Achieved - 04/19/22   6.  Pt will be able to rid home of aid  devices Baseline: taken toilet seats out, uses stool in shower but for convenience, not need; still using walker to carry things Target date: 07/06/22 Goal status: ongoing  7. Start walking around the neighborhood, ultimately without AD  Baseline: has not walked neighborhood since injection  Target date: 07/06/22  Goal status: New   PLAN: PT FREQUENCY: 1-2x/week  PT DURATION: other: 4 months   PLANNED INTERVENTIONS: Therapeutic exercises, Therapeutic activity, Neuromuscular re-education, Balance training, Gait training, Patient/Family education, Joint mobilization, Stair training, Aquatic Therapy, Electrical stimulation, Cryotherapy, Moist heat, Taping, and Manual therapy   PLAN FOR NEXT SESSION: End of POC; assess goals for recert vs d/c.     Kerin Perna, PTA 07/03/22 12:49 PM

## 2022-07-05 ENCOUNTER — Ambulatory Visit (HOSPITAL_BASED_OUTPATIENT_CLINIC_OR_DEPARTMENT_OTHER): Payer: Medicare Other | Admitting: Physical Therapy

## 2022-07-05 ENCOUNTER — Other Ambulatory Visit: Payer: Self-pay | Admitting: Endocrinology

## 2022-07-05 ENCOUNTER — Other Ambulatory Visit (HOSPITAL_COMMUNITY): Payer: Self-pay | Admitting: Endocrinology

## 2022-07-05 ENCOUNTER — Encounter (HOSPITAL_BASED_OUTPATIENT_CLINIC_OR_DEPARTMENT_OTHER): Payer: Self-pay | Admitting: Physical Therapy

## 2022-07-05 DIAGNOSIS — E041 Nontoxic single thyroid nodule: Secondary | ICD-10-CM

## 2022-07-05 DIAGNOSIS — M6281 Muscle weakness (generalized): Secondary | ICD-10-CM | POA: Diagnosis not present

## 2022-07-05 DIAGNOSIS — R262 Difficulty in walking, not elsewhere classified: Secondary | ICD-10-CM | POA: Diagnosis not present

## 2022-07-05 DIAGNOSIS — M79604 Pain in right leg: Secondary | ICD-10-CM

## 2022-07-05 DIAGNOSIS — M25551 Pain in right hip: Secondary | ICD-10-CM | POA: Diagnosis not present

## 2022-07-05 NOTE — Therapy (Signed)
OUTPATIENT PHYSICAL THERAPY TREATMENT NOTE     Patient Name: Bruce Ball MRN: [id number] DOB:March 28, 1946, 76 y.o., male Today's Date: 07/05/2022  PCP: [name], MD REFERRING PROVIDER: [name], MD   PT End of Session - 07/05/22 1059     Visit Number 27    Number of Visits 45    Date for PT Re-Evaluation 08/09/22    Authorization Type MCR    Progress Note Due on Visit 14    PT Start Time 1100    PT Stop Time 1143    PT Time Calculation (min) 43 min    Activity Tolerance Patient tolerated treatment well    Behavior During Therapy WFL for tasks assessed/performed                Past Medical History:  Diagnosis Date   Ankylosing spondylitis ([place])    Arthritis of right knee    Basal cell carcinoma    Chronic back pain greater than 3 months duration    Chronic leg pain    CKD (chronic kidney disease), stage II    Coronary artery calcification seen on CAT scan 03/27/2021   Deafness in right ear    Dyspnea    cause by PE   Essential tremor    Extrinsic asthma, unspecified 04/07/2009   PT DENIES   GERD (gastroesophageal reflux disease)    Hearing loss    Hip problem    History of kidney stones    HYPERTENSION 10/29/2007   HYPOTENSION 08/13/2010   LOW BACK PAIN 04/21/2008   Mild aortic stenosis    NEPHROLITHIASIS, HX OF 04/21/2008   Neuropathy    Osteopenia    Other hyperlipidemia    Peptic ulcer    Pneumonia    PUD, HX OF 04/21/2008   Pulmonary emboli ([place]) 2021   noted on CT 12-2019   Renal disorder    Stage 3 chronic kidney disease (HCC)    Thyroid nodule    Past Surgical History:  Procedure Laterality Date   BACK SURGERY     bone cancer  1970   rt femur removed and steel rod placed   CYSTOSCOPY WITH RETROGRADE PYELOGRAM, URETEROSCOPY AND STENT PLACEMENT Right 11/06/2017   Procedure: CYSTOSCOPY WITH  STENT PLACEMENT RIGHT;  Surgeon: Raynelle Bring, MD;  Location: [facility];  Service: Urology;  Laterality: Right;   CYSTOSCOPY WITH  RETROGRADE PYELOGRAM, URETEROSCOPY AND STENT PLACEMENT Left 06/09/2019   Procedure: CYSTOSCOPY WITH RETROGRADE PYELOGRAM, URETEROSCOPY AND STENT PLACEMENT X2;  Surgeon: [name], MD;  Location: [facility];  Service: Urology;  Laterality: Left;   CYSTOSCOPY WITH RETROGRADE PYELOGRAM, URETEROSCOPY AND STENT PLACEMENT Bilateral 03/01/2020   Procedure: CYSTOSCOPY WITH RETROGRADE PYELOGRAM, URETEROSCOPY AND STENT PLACEMENT;  Surgeon: [name], MD;  Location: [facility];  Service: Urology;  Laterality: Bilateral;  75 MINS   CYSTOSCOPY/RETROGRADE/URETEROSCOPY     1 year ago at [place] in [place] Right 11/24/2017   Procedure: CYSTOSCOPY/URETEROSCOPY/ RETROGRADE/STENT REMOVAL;  Surgeon: Raynelle Bring, MD;  Location: [facility];  Service: Urology;  Laterality: Right;   HOLMIUM LASER APPLICATION Bilateral 03/07/299   Procedure: HOLMIUM LASER APPLICATION;  Surgeon: [name], MD;  Location: [facility];  Service: Urology;  Laterality: Bilateral;   prosatectomy     Patient Active Problem List   Diagnosis Date Noted   History of pulmonary embolism 10/10/2021   HTN (hypertension) 10/10/2021   Chronic diastolic CHF (congestive heart failure) ([place]) 10/10/2021   GERD (gastroesophageal reflux disease) 10/10/2021  Ankylosing spondylitis ([place]) 10/10/2021   CKD (chronic kidney disease), stage III ([place]) 10/10/2021   Hyperlipidemia 10/10/2021   CAD (coronary artery disease) 10/10/2021   Periprosthetic fracture of shaft of femur 10/10/2021   Other fatigue 06/05/2021   CAD (coronary artery disease) 94/85/4627   Diastolic CHF ([place]) 03/50/0938   SOB (shortness of breath) on exertion 03/26/2021   Chronic anticoagulation 03/26/2021   Anemia in chronic kidney disease 01/25/2021   Acquired coagulation disorder ([place]) 12/12/2020   Senile purpura ([place]) 12/12/2020   S/P lumbar fusion 08/03/2020   Thin skin 07/25/2020   Aortic atherosclerosis ([place]) 05/17/2020   B12 deficiency  05/17/2020   Iliac artery aneurysm ([place]) 05/17/2020   Osteoporosis 04/04/2020   Idiopathic neuropathy 01/28/2020   Recurrent falls 01/28/2020   Chronic kidney disease (CKD), stage III (moderate) ([place]) 01/19/2020   Pulmonary emboli ([place]) 01/14/2020   GERD (gastroesophageal reflux disease) 12/22/2019   Essential tremor 12/22/2019   Femoral condyle fracture ([place]) 07/15/2019   Gross hematuria 06/14/2019   Hyperlipidemia 11/11/2017   Kidney stone 11/05/2017   Community acquired pneumonia of right upper lobe of lung 11/05/2017   Right ureteral stone 11/05/2017   History of total right hip replacement 03/08/2017   History of prostate cancer 03/08/2017   Personal history of prostate cancer 02/28/2017   High risk medication use 02/28/2017   DDD (degenerative disc disease), thoracic 02/28/2017   Ataxia 05/18/2016   Vestibular disequilibrium 05/18/2016   Ankylosing spondylitis of cervical region ([place]) 05/24/2014   Lung nodule, solitary 05/24/2014   Lumbosacral stenosis 06/05/2013   Peripheral edema 06/08/2012   Subcortical microvascular ischemic occlusive disease 08/13/2010   Extrinsic asthma 04/07/2009   Backache 04/21/2008   History of peptic ulcer disease 04/21/2008   Essential hypertension 10/29/2007  REFERRING PROVIDER: [name], MD   REFERRING DIAG:  M54.50,G89.29 (ICD-10-CM) - Chronic bilateral low back pain without sciatica  R26.89 (ICD-10-CM) - Other abnormalities of gait and mobility      THERAPY DIAG:  Pain in right hip   Pain in right leg   Difficulty in walking, not elsewhere classified   Muscle weakness (generalized)   ONSET DATE: October 10, 2021 recent fall causing fracture  Progress Note Reporting Period 06/07/22 to 07/05/22  See note below for Objective Data and Assessment of Progress/Goals.      SUBJECTIVE:                                                                                                                                                                                             SUBJECTIVE STATEMENT: It is very beneficial. I think overall I am  doing better, bursitis acts up. Sinuses are acting up and making things difficult. I would like to continue and 1/week on land and 1 in pool is doing well. I had to take pain meds on Tues for the right hip- [facility].    PERTINENT HISTORY:  Stenosis, Rt THA 38 yr ago with h/o periprosthetic fx & revision in 2022, h/o back surgery  Hard of hearing; can hear out of Left ear   PAIN:  Are you having pain? Yes NPRS scale: 6/10 Pain location: Rt hip   Aggravating factors: Rt WB Relieving factors: rest   PRECAUTIONS: Posterior hip   WEIGHT BEARING RESTRICTIONS No   FALLS:  Has patient fallen in last 6 months? Yes, Number of falls: 1 4/21: no falls recently 6/9: denies new falls 7/7: denies falls     PATIENT GOALS build up strength, get rid of assistance for toileting and getting in/out of bed, no AD in gait, stairs, Son's wedding in [place] first of June     OBJECTIVE: *All objective findings taken at [facility] unless otherwise noted.    PATIENT SURVEYS:  LEFS 39 4/21: 46 5/19: 43 6/9: 43 7/7: 43  0 on walking 2 blocks, 1 mile & stiars- would like to see these improve in particular     COGNITION:          Overall cognitive status: Within functional limits for tasks assessed                        SENSATION:          Bil hand/feet neuropathy unchanged since evaluation       LE MMT:  6/9 measured with handheld dynamometry Normative values: hip flexion 38 lb, knee extension 79 lb   MMT Right 03/06/2022 Right/Left 04/19/22 Rt/Lt 05/17/22 Rt/Lt 06/07/22 Rt//Lt  Hip flexion 4/5 11.1/ 24.3 20.9/42.6 34.8/44.2 38.9//42.1  Knee flexion 3/5 32.3/37.9 37.7/55.3 38.7/48.6 42.2//54.9  Knee extension 3/5 12.1/42.4 20.7/60.7 25.8/50.5 24.9//50.6   (Blank rows = not tested)  6/23:unable to lift RtLE against gravity in sidelying        FUNCTIONAL TESTS:  5 times sit to stand: 38s 4/21: 18s without use of UEs 6/9:16s without use of UEs Berg Balance Scale: 33/56 4/21: 40/50 6/9: 44/50   GAIT:   Comments: SPC in Lt hand, rt leg abducted and externally rotated, antalgic on Rt with pressure placed through cane 4/21: SPC in Lt hand, Rt leg no longer abd /ER; does demo Rt trunk SB in Rt stance phase 6/9: SPC in Lt hand, mild Rt trunk SB in Rt stance phase, maintains upright posture 7/7: with SPC: maint upright posture wihtout Rt sidebend in stance phase; without SPC- cont upright posture and good swing through but does present sidebend to the Rt in Rt stance phase        TODAY'S TREATMENT  7/7: Muscle testing, LEFS, goals discussion for PN Seated LAQ without back support or UE assist- adding core MANUAL: STM to quads, adductors, pectineus, passive abd+ext stretching of Rt hip Adductor stretching: seated & standing   7/5  Pt seen for aquatic therapy today.  Treatment took place in water 3.25-4 ft 8 in depth at the [facility] pool. Temp of water was 92.  Pt entered/exited the pool via stairs independently with bilat rail, step-to pattern.   Without UE support:  Forward/ backward walking in 19f, 8" water Side stepping with shoulder  abdct/ add with yellow hand buoys Forward/backward  walking lunges with yellow hand buoys In 3 ft- opp arm / leg march, pausing in SLS x 5 each Hurdle walk forward in 67f 8" water Warrior 3 in water, (SLS with forward lean, UE on yellow noodle Monster walk forward / backward  Wood chop in staggered stance in 4 ft water STS at bench x 5 Supported by noodle and yellow hand buoys: bicycle and hip abdct / add  R/L hamstring stretch with foot on 2nd step   Pt requires buoyancy for support and to offload joints with strengthening exercises. Viscosity of the water is needed for resistance of strengthening; water current perturbations provides challenge to standing balance  unsupported, requiring increased core activation.        PATIENT EDUCATION:  Education details: AGeophysicist/field seismologistof condition, POC, HEP, exercise form/rationale  Person educated: Patient and Spouse Education method: Explanation, Demonstration, Tactile cues, and Verbal cues Education comprehension: verbalized understanding, returned demonstration, verbal cues required, tactile cues required, and needs further education     HOME EXERCISE PROGRAM: [id number]   ASSESSMENT:   CLINICAL IMPRESSION: Pt continues to make significant improvement in ambulation ability and tolerance. Noted today that hip adductor flexibility is limiting ROM for upright posture and will continue to address- improving this ROM will improve length-tension relationship for quad strengthening. Will extend POC and plan to re-evaluate at next PN.    OBJECTIVE IMPAIRMENTS Abnormal gait, decreased activity tolerance, decreased balance, difficulty walking, decreased strength, impaired sensation, improper body mechanics, postural dysfunction, and pain.    ACTIVITY LIMITATIONS cleaning, community activity, driving, meal prep, laundry, and shopping.    PERSONAL FACTORS  see problem list  are also affecting patient's functional outcome.      REHAB POTENTIAL: Good   CLINICAL DECISION MAKING: Unstable/unpredictable   EVALUATION COMPLEXITY: High     GOALS: Goals reviewed with patient? Yes   SHORT TERM GOALS:   Able to enter/exit pool independently with use of hand rails Baseline: will begin training Target date: 04/03/2022 Goal status: Achieved    2.  Good tolerance to exercise without limitations in next day due to pain Baseline: will establish proper working level Target date: 04/03/2022 Goal status: Ongoing    3.  Able to shift weight to perform step taps with UE assist Baseline: unable at eval Target date: 04/03/2022 Goal status: Achieved    4.  Able to ambulate with centered weight distribution Baseline: at eval- leg  abducted and trunk lean Target date: 04/03/2022 Goal status: Ongoing       LONG TERM GOALS:   BERG to improve by MDC of 6 points Baseline: 33 at eval Target date:  07/06/22 Goal status: achieved   2.  Pt will ambulate safely around his home wihtout use of AD Baseline: achieved Goal status: ongoing   3.  Able to navigate stairs in home safely and confidently Baseline:navigating stairs with "up with the good, down with the bad" pattern  Goal status: ongoing   4.  LEFS to improve by MDC Baseline: MDC is 9 points  Goal status: INITIAL   5.  Pt will demo 5TSTS in 20s or less Baseline: 38s at eval  Goal status: Achieved - 04/19/22   6.  Pt will be able to rid home of aid devices Baseline: taken toilet seats out, uses stool in shower but for convenience, not need; still using walker to carry things  Goal status: partially met  7. Start walking around the neighborhood, ultimately without  AD  Baseline: walking but with cane.     Goal status: ongoing   PLAN: PT FREQUENCY: 1-2x/week   PT DURATION: other: 4 months   PLANNED INTERVENTIONS: Therapeutic exercises, Therapeutic activity, Neuromuscular re-education, Balance training, Gait training, Patient/Family education, Joint mobilization, Stair training, Aquatic Therapy, Electrical stimulation, Cryotherapy, Moist heat, Taping, and Manual therapy   PLAN FOR NEXT SESSION: adductor flexibility. Continue balance in more shallow water   Desarae Placide C. Major [place] PT, DPT 07/05/22 11:57 AM

## 2022-07-06 DIAGNOSIS — I2699 Other pulmonary embolism without acute cor pulmonale: Secondary | ICD-10-CM | POA: Diagnosis not present

## 2022-07-09 NOTE — Progress Notes (Unsigned)
Bruce Edouard, MD  Donita Brooks D There is only a thyroid US from 06/13/2021. This is too old to base a biopsy on. He would have to have a more recent or repeat/updated thyroid US before considering biopsy.   GY

## 2022-07-10 ENCOUNTER — Ambulatory Visit (HOSPITAL_BASED_OUTPATIENT_CLINIC_OR_DEPARTMENT_OTHER): Payer: Medicare Other | Admitting: Physical Therapy

## 2022-07-10 DIAGNOSIS — R262 Difficulty in walking, not elsewhere classified: Secondary | ICD-10-CM

## 2022-07-10 DIAGNOSIS — M25551 Pain in right hip: Secondary | ICD-10-CM

## 2022-07-10 DIAGNOSIS — M79604 Pain in right leg: Secondary | ICD-10-CM

## 2022-07-10 DIAGNOSIS — M6281 Muscle weakness (generalized): Secondary | ICD-10-CM

## 2022-07-10 NOTE — Therapy (Signed)
OUTPATIENT PHYSICAL THERAPY TREATMENT NOTE     Patient Name: Bruce Ball MRN: [id number] DOB:1946-04-01, 76 y.o., male Today's Date: 07/10/2022  PCP: [name], MD REFERRING PROVIDER: [name], MD   PT End of Session - 07/10/22 1208     Visit Number 28    Number of Visits 33    Date for PT Re-Evaluation 08/09/22    Authorization Type MCR    Progress Note Due on Visit 39    PT Start Time 1200    PT Stop Time 6333    PT Time Calculation (min) 41 min    Activity Tolerance Patient tolerated treatment well    Behavior During Therapy WFL for tasks assessed/performed                Past Medical History:  Diagnosis Date   Ankylosing spondylitis ([place])    Arthritis of right knee    Basal cell carcinoma    Chronic back pain greater than 3 months duration    Chronic leg pain    CKD (chronic kidney disease), stage II    Coronary artery calcification seen on CAT scan 03/27/2021   Deafness in right ear    Dyspnea    cause by PE   Essential tremor    Extrinsic asthma, unspecified 04/07/2009   PT DENIES   GERD (gastroesophageal reflux disease)    Hearing loss    Hip problem    History of kidney stones    HYPERTENSION 10/29/2007   HYPOTENSION 08/13/2010   LOW BACK PAIN 04/21/2008   Mild aortic stenosis    NEPHROLITHIASIS, HX OF 04/21/2008   Neuropathy    Osteopenia    Other hyperlipidemia    Peptic ulcer    Pneumonia    PUD, HX OF 04/21/2008   Pulmonary emboli ([place]) 2021   noted on CT 12-2019   Renal disorder    Stage 3 chronic kidney disease (HCC)    Thyroid nodule    Past Surgical History:  Procedure Laterality Date   BACK SURGERY     bone cancer  1970   rt femur removed and steel rod placed   CYSTOSCOPY WITH RETROGRADE PYELOGRAM, URETEROSCOPY AND STENT PLACEMENT Right 11/06/2017   Procedure: CYSTOSCOPY WITH  STENT PLACEMENT RIGHT;  Surgeon: Raynelle Bring, MD;  Location: [facility];  Service: Urology;  Laterality: Right;   CYSTOSCOPY WITH  RETROGRADE PYELOGRAM, URETEROSCOPY AND STENT PLACEMENT Left 06/09/2019   Procedure: CYSTOSCOPY WITH RETROGRADE PYELOGRAM, URETEROSCOPY AND STENT PLACEMENT X2;  Surgeon: [name], MD;  Location: [facility];  Service: Urology;  Laterality: Left;   CYSTOSCOPY WITH RETROGRADE PYELOGRAM, URETEROSCOPY AND STENT PLACEMENT Bilateral 03/01/2020   Procedure: CYSTOSCOPY WITH RETROGRADE PYELOGRAM, URETEROSCOPY AND STENT PLACEMENT;  Surgeon: [name], MD;  Location: [facility];  Service: Urology;  Laterality: Bilateral;  75 MINS   CYSTOSCOPY/RETROGRADE/URETEROSCOPY     1 year ago at [place] in [place] Right 11/24/2017   Procedure: CYSTOSCOPY/URETEROSCOPY/ RETROGRADE/STENT REMOVAL;  Surgeon: Raynelle Bring, MD;  Location: [facility];  Service: Urology;  Laterality: Right;   HOLMIUM LASER APPLICATION Bilateral 05/02/5624   Procedure: HOLMIUM LASER APPLICATION;  Surgeon: [name], MD;  Location: [facility];  Service: Urology;  Laterality: Bilateral;   prosatectomy     Patient Active Problem List   Diagnosis Date Noted   History of pulmonary embolism 10/10/2021   HTN (hypertension) 10/10/2021   Chronic diastolic CHF (congestive heart failure) ([name]) 10/10/2021   GERD (gastroesophageal reflux disease) 10/10/2021  Ankylosing spondylitis ([place]) 10/10/2021   CKD (chronic kidney disease), stage III ([place]) 10/10/2021   Hyperlipidemia 10/10/2021   CAD (coronary artery disease) 10/10/2021   Periprosthetic fracture of shaft of femur 10/10/2021   Other fatigue 06/05/2021   CAD (coronary artery disease) 09/62/8366   Diastolic CHF ([place]) 29/47/6546   SOB (shortness of breath) on exertion 03/26/2021   Chronic anticoagulation 03/26/2021   Anemia in chronic kidney disease 01/25/2021   Acquired coagulation disorder ([place]) 12/12/2020   Senile purpura ([place]) 12/12/2020   S/P lumbar fusion 08/03/2020   Thin skin 07/25/2020   Aortic atherosclerosis ([place]) 05/17/2020   B12 deficiency  05/17/2020   Iliac artery aneurysm ([place]) 05/17/2020   Osteoporosis 04/04/2020   Idiopathic neuropathy 01/28/2020   Recurrent falls 01/28/2020   Chronic kidney disease (CKD), stage III (moderate) ([place]) 01/19/2020   Pulmonary emboli ([place]) 01/14/2020   GERD (gastroesophageal reflux disease) 12/22/2019   Essential tremor 12/22/2019   Femoral condyle fracture ([place]) 07/15/2019   Gross hematuria 06/14/2019   Hyperlipidemia 11/11/2017   Kidney stone 11/05/2017   Community acquired pneumonia of right upper lobe of lung 11/05/2017   Right ureteral stone 11/05/2017   History of total right hip replacement 03/08/2017   History of prostate cancer 03/08/2017   Personal history of prostate cancer 02/28/2017   High risk medication use 02/28/2017   DDD (degenerative disc disease), thoracic 02/28/2017   Ataxia 05/18/2016   Vestibular disequilibrium 05/18/2016   Ankylosing spondylitis of cervical region ([place]) 05/24/2014   Lung nodule, solitary 05/24/2014   Lumbosacral stenosis 06/05/2013   Peripheral edema 06/08/2012   Subcortical microvascular ischemic occlusive disease 08/13/2010   Extrinsic asthma 04/07/2009   Backache 04/21/2008   History of peptic ulcer disease 04/21/2008   Essential hypertension 10/29/2007  REFERRING PROVIDER: [name], MD   REFERRING DIAG:  M54.50,G89.29 (ICD-10-CM) - Chronic bilateral low back pain without sciatica  R26.89 (ICD-10-CM) - Other abnormalities of gait and mobility      THERAPY DIAG:  Pain in right hip   Pain in right leg   Difficulty in walking, not elsewhere classified   Muscle weakness (generalized)   ONSET DATE: October 10, 2021 recent fall causing fracture  Progress Note Reporting Period 06/07/22 to 07/05/22  See note below for Objective Data and Assessment of Progress/Goals.      SUBJECTIVE:                                                                                                                                                                                             SUBJECTIVE STATEMENT: Pt reports he is having CT scan of sinuses  and an appt with ENT tomorrow.  "I would like to work on strength and balance".   PERTINENT HISTORY:  Stenosis, Rt THA 65 yr ago with h/o periprosthetic fx & revision in 2022, h/o back surgery  Hard of hearing; can hear out of Left ear   PAIN:  Are you having pain? Yes NPRS scale: 6/10 Pain location: Rt hip   Aggravating factors: Rt WB Relieving factors: rest   PRECAUTIONS: Posterior hip   WEIGHT BEARING RESTRICTIONS No   FALLS:  Has patient fallen in last 6 months? Yes, Number of falls: 1 4/21: no falls recently 6/9: denies new falls 7/7: denies falls     PATIENT GOALS build up strength, get rid of assistance for toileting and getting in/out of bed, no AD in gait, stairs, Son's wedding in [place] first of June     OBJECTIVE: *All objective findings taken at [facility] unless otherwise noted.    PATIENT SURVEYS:  LEFS 39 4/21: 46 5/19: 43 6/9: 43 7/7: 43  0 on walking 2 blocks, 1 mile & stiars- would like to see these improve in particular     COGNITION:          Overall cognitive status: Within functional limits for tasks assessed                        SENSATION:          Bil hand/feet neuropathy unchanged since evaluation       LE MMT:  6/9 measured with handheld dynamometry Normative values: hip flexion 38 lb, knee extension 79 lb   MMT Right 03/06/2022 Right/Left 04/19/22 Rt/Lt 05/17/22 Rt/Lt 06/07/22 Rt//Lt 07/05/22  Hip flexion 4/5 11.1/ 24.3 20.9/42.6 34.8/44.2 38.9//42.1  Knee flexion 3/5 32.3/37.9 37.7/55.3 38.7/48.6 42.2//54.9  Knee extension 3/5 12.1/42.4 20.7/60.7 25.8/50.5 24.9//50.6   (Blank rows = not tested)  6/23:unable to lift RtLE against gravity in sidelying       FUNCTIONAL TESTS:  5 times sit to stand: 38s 4/21: 18s without use of UEs 6/9:16s without use of UEs Berg Balance Scale: 33/56 4/21: 40/50 6/9: 44/50   GAIT:    Comments: SPC in Lt hand, rt leg abducted and externally rotated, antalgic on Rt with pressure placed through cane 4/21: SPC in Lt hand, Rt leg no longer abd /ER; does demo Rt trunk SB in Rt stance phase 6/9: SPC in Lt hand, mild Rt trunk SB in Rt stance phase, maintains upright posture 7/7: with SPC: maint upright posture wihtout Rt sidebend in stance phase; without SPC- cont upright posture and good swing through but does present sidebend to the Rt in Rt stance phase        TODAY'S TREATMENT  7/12  Pt seen for aquatic therapy today.  Treatment took place in water 3.25-4 ft 8 in depth at the [facility] pool. Temp of water was 92.  Pt entered/exited the pool via stairs independently with bilat rail, step-to pattern.   Without UE support:  Forward/ backward walking in 28f, 8" water Side stepping with shoulder abdct/ add  Holding yellow noodle - tap behinds (curtsy) Rt forward step ups, Rt lateral step ups (intermittent UE on wall, just under 4 ft) x 10 each Warrior 3 in water, (SLS with forward lean, UE on yellow noodle) alternating LE Forward/backward  walking lunges with rainbow hand buoys (<4 ft) Forward kicking in 3.5 ft water Squats pushing blue hand buoy under water 478fwater - SLS x 10 sec  each, UE skulling to steady Hurdle walk forward in 34f water Tandem gait forward/ backward just under 452f High knee march with 3 sec pause in SLS just under 4 ft STS at bench x 5, blue step under feet, eccentric lowering Holding wall- side lunge to stretch adductors, cues for form/to slow speed)  7/7: Muscle testing, LEFS, goals discussion for PN Seated LAQ without back support or UE assist- adding core MANUAL: STM to quads, adductors, pectineus, passive abd+ext stretching of Rt hip Adductor stretching: seated & standing    PATIENT EDUCATION:  Education details: Anatomy of condition, POC, HEP, exercise form/rationale  Person educated: Patient and Spouse Education method:  Explanation, Demonstration, Tactile cues, and Verbal cues Education comprehension: verbalized understanding, returned demonstration, verbal cues required, tactile cues required, and needs further education     HOME EXERCISE PROGRAM: [id number]   ASSESSMENT:   CLINICAL IMPRESSION: Pt able to complete aquatic exercises in water just under 4 ft of depth without any episodes of increased Rt knee/hip pain.  Able to stand in SLS longer than previous sessions, with less UE support.  Progressing well towards remaining goals.    OBJECTIVE IMPAIRMENTS Abnormal gait, decreased activity tolerance, decreased balance, difficulty walking, decreased strength, impaired sensation, improper body mechanics, postural dysfunction, and pain.    ACTIVITY LIMITATIONS cleaning, community activity, driving, meal prep, laundry, and shopping.    PERSONAL FACTORS  see problem list  are also affecting patient's functional outcome.      REHAB POTENTIAL: Good   CLINICAL DECISION MAKING: Unstable/unpredictable   EVALUATION COMPLEXITY: High     GOALS: Goals reviewed with patient? Yes   SHORT TERM GOALS:   Able to enter/exit pool independently with use of hand rails Baseline: will begin training Target date: 04/03/2022 Goal status: Achieved    2.  Good tolerance to exercise without limitations in next day due to pain Baseline: will establish proper working level Target date: 04/03/2022 Goal status: Ongoing    3.  Able to shift weight to perform step taps with UE assist Baseline: unable at eval Target date: 04/03/2022 Goal status: Achieved    4.  Able to ambulate with centered weight distribution Baseline: at eval- leg abducted and trunk lean Target date: 04/03/2022 Goal status: Ongoing       LONG TERM GOALS:   BERG to improve by MDC of 6 points Baseline: 33 at eval Target date:  07/06/22 Goal status: achieved   2.  Pt will ambulate safely around his home wihtout use of AD Baseline: achieved Goal  status: ongoing   3.  Able to navigate stairs in home safely and confidently Baseline:navigating stairs with "up with the good, down with the bad" pattern  Goal status: ongoing   4.  LEFS to improve by MDC Baseline: MDC is 9 points  Goal status: INITIAL   5.  Pt will demo 5TSTS in 20s or less Baseline: 38s at eval  Goal status: Achieved - 04/19/22   6.  Pt will be able to rid home of aid devices Baseline: taken toilet seats out, uses stool in shower but for convenience, not need; still using walker to carry things  Goal status: partially met  7. Start walking around the neighborhood, ultimately without AD  Baseline: walking but with cane.     Goal status: ongoing   PLAN: PT FREQUENCY: 1-2x/week   PT DURATION: other: 4 months   PLANNED INTERVENTIONS: Therapeutic exercises, Therapeutic activity, Neuromuscular re-education, Balance training, Gait training, Patient/Family education, Joint mobilization,  Stair training, Aquatic Therapy, Electrical stimulation, Cryotherapy, Moist heat, Taping, and Manual therapy   PLAN FOR NEXT SESSION: adductor flexibility. Continue balance in more shallow water  Kerin Perna, PTA 07/10/22 12:53 PM

## 2022-07-11 DIAGNOSIS — Z87891 Personal history of nicotine dependence: Secondary | ICD-10-CM | POA: Diagnosis not present

## 2022-07-11 DIAGNOSIS — J338 Other polyp of sinus: Secondary | ICD-10-CM | POA: Diagnosis not present

## 2022-07-11 DIAGNOSIS — J339 Nasal polyp, unspecified: Secondary | ICD-10-CM | POA: Diagnosis not present

## 2022-07-11 DIAGNOSIS — Z79899 Other long term (current) drug therapy: Secondary | ICD-10-CM | POA: Diagnosis not present

## 2022-07-11 DIAGNOSIS — J3489 Other specified disorders of nose and nasal sinuses: Secondary | ICD-10-CM | POA: Insufficient documentation

## 2022-07-11 DIAGNOSIS — J309 Allergic rhinitis, unspecified: Secondary | ICD-10-CM | POA: Diagnosis not present

## 2022-07-11 DIAGNOSIS — Z7951 Long term (current) use of inhaled steroids: Secondary | ICD-10-CM | POA: Diagnosis not present

## 2022-07-11 DIAGNOSIS — J328 Other chronic sinusitis: Secondary | ICD-10-CM | POA: Diagnosis not present

## 2022-07-11 DIAGNOSIS — Z9889 Other specified postprocedural states: Secondary | ICD-10-CM | POA: Diagnosis not present

## 2022-07-11 DIAGNOSIS — J343 Hypertrophy of nasal turbinates: Secondary | ICD-10-CM | POA: Diagnosis not present

## 2022-07-11 DIAGNOSIS — J3089 Other allergic rhinitis: Secondary | ICD-10-CM | POA: Diagnosis not present

## 2022-07-11 DIAGNOSIS — J324 Chronic pansinusitis: Secondary | ICD-10-CM | POA: Insufficient documentation

## 2022-07-12 ENCOUNTER — Ambulatory Visit (HOSPITAL_BASED_OUTPATIENT_CLINIC_OR_DEPARTMENT_OTHER): Payer: Medicare Other | Admitting: Physical Therapy

## 2022-07-12 ENCOUNTER — Telehealth: Payer: Self-pay | Admitting: Pharmacist

## 2022-07-12 ENCOUNTER — Encounter (HOSPITAL_BASED_OUTPATIENT_CLINIC_OR_DEPARTMENT_OTHER): Payer: Self-pay | Admitting: Physical Therapy

## 2022-07-12 DIAGNOSIS — R262 Difficulty in walking, not elsewhere classified: Secondary | ICD-10-CM | POA: Diagnosis not present

## 2022-07-12 DIAGNOSIS — M6281 Muscle weakness (generalized): Secondary | ICD-10-CM

## 2022-07-12 DIAGNOSIS — M79604 Pain in right leg: Secondary | ICD-10-CM | POA: Diagnosis not present

## 2022-07-12 DIAGNOSIS — M25551 Pain in right hip: Secondary | ICD-10-CM | POA: Diagnosis not present

## 2022-07-12 NOTE — Therapy (Signed)
OUTPATIENT PHYSICAL THERAPY TREATMENT NOTE     Patient Name: GALILEO COLELLO MRN: [id number] DOB:05/03/1946, 76 y.o., male Today's Date: 07/12/2022  PCP: [name], MD REFERRING PROVIDER: [name], MD   PT End of Session - 07/12/22 1105     Visit Number 29    Number of Visits 61    Date for PT Re-Evaluation 08/09/22    Authorization Type MCR    Progress Note Due on Visit 11    PT Start Time 1103    PT Stop Time 1142    PT Time Calculation (min) 39 min    Activity Tolerance Patient tolerated treatment well    Behavior During Therapy WFL for tasks assessed/performed                Past Medical History:  Diagnosis Date   Ankylosing spondylitis ([place])    Arthritis of right knee    Basal cell carcinoma    Chronic back pain greater than 3 months duration    Chronic leg pain    CKD (chronic kidney disease), stage II    Coronary artery calcification seen on CAT scan 03/27/2021   Deafness in right ear    Dyspnea    cause by PE   Essential tremor    Extrinsic asthma, unspecified 04/07/2009   PT DENIES   GERD (gastroesophageal reflux disease)    Hearing loss    Hip problem    History of kidney stones    HYPERTENSION 10/29/2007   HYPOTENSION 08/13/2010   LOW BACK PAIN 04/21/2008   Mild aortic stenosis    NEPHROLITHIASIS, HX OF 04/21/2008   Neuropathy    Osteopenia    Other hyperlipidemia    Peptic ulcer    Pneumonia    PUD, HX OF 04/21/2008   Pulmonary emboli ([place]) 2021   noted on CT 12-2019   Renal disorder    Stage 3 chronic kidney disease (HCC)    Thyroid nodule    Past Surgical History:  Procedure Laterality Date   BACK SURGERY     bone cancer  1970   rt femur removed and steel rod placed   CYSTOSCOPY WITH RETROGRADE PYELOGRAM, URETEROSCOPY AND STENT PLACEMENT Right 11/06/2017   Procedure: CYSTOSCOPY WITH  STENT PLACEMENT RIGHT;  Surgeon: Raynelle Bring, MD;  Location: [facility];  Service: Urology;  Laterality: Right;   CYSTOSCOPY WITH  RETROGRADE PYELOGRAM, URETEROSCOPY AND STENT PLACEMENT Left 06/09/2019   Procedure: CYSTOSCOPY WITH RETROGRADE PYELOGRAM, URETEROSCOPY AND STENT PLACEMENT X2;  Surgeon: [name], MD;  Location: [facility];  Service: Urology;  Laterality: Left;   CYSTOSCOPY WITH RETROGRADE PYELOGRAM, URETEROSCOPY AND STENT PLACEMENT Bilateral 03/01/2020   Procedure: CYSTOSCOPY WITH RETROGRADE PYELOGRAM, URETEROSCOPY AND STENT PLACEMENT;  Surgeon: [name], MD;  Location: [facility];  Service: Urology;  Laterality: Bilateral;  75 MINS   CYSTOSCOPY/RETROGRADE/URETEROSCOPY     1 year ago at [place] in [place] Right 11/24/2017   Procedure: CYSTOSCOPY/URETEROSCOPY/ RETROGRADE/STENT REMOVAL;  Surgeon: Raynelle Bring, MD;  Location: [facility];  Service: Urology;  Laterality: Right;   HOLMIUM LASER APPLICATION Bilateral 05/01/50   Procedure: HOLMIUM LASER APPLICATION;  Surgeon: [name], MD;  Location: [facility];  Service: Urology;  Laterality: Bilateral;   prosatectomy     Patient Active Problem List   Diagnosis Date Noted   History of pulmonary embolism 10/10/2021   HTN (hypertension) 10/10/2021   Chronic diastolic CHF (congestive heart failure) ([place]) 10/10/2021   GERD (gastroesophageal reflux disease) 10/10/2021  Ankylosing spondylitis ([place]) 10/10/2021   CKD (chronic kidney disease), stage III ([place]) 10/10/2021   Hyperlipidemia 10/10/2021   CAD (coronary artery disease) 10/10/2021   Periprosthetic fracture of shaft of femur 10/10/2021   Other fatigue 06/05/2021   CAD (coronary artery disease) 48/54/6270   Diastolic CHF ([place]) 35/00/9381   SOB (shortness of breath) on exertion 03/26/2021   Chronic anticoagulation 03/26/2021   Anemia in chronic kidney disease 01/25/2021   Acquired coagulation disorder ([place]) 12/12/2020   Senile purpura ([place]) 12/12/2020   S/P lumbar fusion 08/03/2020   Thin skin 07/25/2020   Aortic atherosclerosis ([place]) 05/17/2020   B12 deficiency  05/17/2020   Iliac artery aneurysm ([place]) 05/17/2020   Osteoporosis 04/04/2020   Idiopathic neuropathy 01/28/2020   Recurrent falls 01/28/2020   Chronic kidney disease (CKD), stage III (moderate) ([place]) 01/19/2020   Pulmonary emboli ([place]) 01/14/2020   GERD (gastroesophageal reflux disease) 12/22/2019   Essential tremor 12/22/2019   Femoral condyle fracture ([place]) 07/15/2019   Gross hematuria 06/14/2019   Hyperlipidemia 11/11/2017   Kidney stone 11/05/2017   Community acquired pneumonia of right upper lobe of lung 11/05/2017   Right ureteral stone 11/05/2017   History of total right hip replacement 03/08/2017   History of prostate cancer 03/08/2017   Personal history of prostate cancer 02/28/2017   High risk medication use 02/28/2017   DDD (degenerative disc disease), thoracic 02/28/2017   Ataxia 05/18/2016   Vestibular disequilibrium 05/18/2016   Ankylosing spondylitis of cervical region ([place]) 05/24/2014   Lung nodule, solitary 05/24/2014   Lumbosacral stenosis 06/05/2013   Peripheral edema 06/08/2012   Subcortical microvascular ischemic occlusive disease 08/13/2010   Extrinsic asthma 04/07/2009   Backache 04/21/2008   History of peptic ulcer disease 04/21/2008   Essential hypertension 10/29/2007  REFERRING PROVIDER: [name], MD   REFERRING DIAG:  M54.50,G89.29 (ICD-10-CM) - Chronic bilateral low back pain without sciatica  R26.89 (ICD-10-CM) - Other abnormalities of gait and mobility      THERAPY DIAG:  Pain in right hip   Pain in right leg   Difficulty in walking, not elsewhere classified   Muscle weakness (generalized)   ONSET DATE: October 10, 2021 recent fall causing fracture  Progress Note Reporting Period 06/07/22 to 07/05/22  See note below for Objective Data and Assessment of Progress/Goals.      SUBJECTIVE:                                                                                                                                                                                             SUBJECTIVE STATEMENT: I have to have surgery on my sinuses on  09/09/22. Some pain in hip today.    PERTINENT HISTORY:  Stenosis, Rt THA 46 yr ago with h/o periprosthetic fx & revision in 2022, h/o back surgery  Hard of hearing; can hear out of Left ear   PAIN:  Are you having pain? Yes NPRS scale: 6/10 Pain location: Rt hip   Aggravating factors: Rt WB Relieving factors: rest   PRECAUTIONS: Posterior hip   WEIGHT BEARING RESTRICTIONS No   FALLS:  Has patient fallen in last 6 months? Yes, Number of falls: 1 4/21: no falls recently 6/9: denies new falls 7/7: denies falls     PATIENT GOALS build up strength, get rid of assistance for toileting and getting in/out of bed, no AD in gait, stairs, Son's wedding in [place] first of June     OBJECTIVE: *All objective findings taken at [facility] unless otherwise noted.    PATIENT SURVEYS:  LEFS 39 4/21: 46 5/19: 43 6/9: 43 7/7: 43  0 on walking 2 blocks, 1 mile & stiars- would like to see these improve in particular     COGNITION:          Overall cognitive status: Within functional limits for tasks assessed                        SENSATION:          Bil hand/feet neuropathy unchanged since evaluation       LE MMT:  6/9 measured with handheld dynamometry Normative values: hip flexion 38 lb, knee extension 79 lb   MMT Right 03/06/2022 Right/Left 04/19/22 Rt/Lt 05/17/22 Rt/Lt 06/07/22 Rt//Lt 07/05/22  Hip flexion 4/5 11.1/ 24.3 20.9/42.6 34.8/44.2 38.9//42.1  Knee flexion 3/5 32.3/37.9 37.7/55.3 38.7/48.6 42.2//54.9  Knee extension 3/5 12.1/42.4 20.7/60.7 25.8/50.5 24.9//50.6   (Blank rows = not tested)  6/23:unable to lift RtLE against gravity in sidelying       FUNCTIONAL TESTS:  5 times sit to stand: 38s 4/21: 18s without use of UEs 6/9:16s without use of UEs Berg Balance Scale: 33/56 4/21: 40/50 6/9: 44/50   GAIT:   Comments: SPC in Lt hand, rt leg abducted and  externally rotated, antalgic on Rt with pressure placed through cane 4/21: SPC in Lt hand, Rt leg no longer abd /ER; does demo Rt trunk SB in Rt stance phase 6/9: SPC in Lt hand, mild Rt trunk SB in Rt stance phase, maintains upright posture 7/7: with SPC: maint upright posture wihtout Rt sidebend in stance phase; without SPC- cont upright posture and good swing through but does present sidebend to the Rt in Rt stance phase        TODAY'S TREATMENT  7/14 GAIT: progression of right hip over Rt LE with use of SPC in left hand At bar: lateral weight shift to SLS, wide tandem balance- also added head turns Gastroc stretch Sidelying hip abd, clams & reverse clam with assist   7/12  Pt seen for aquatic therapy today.  Treatment took place in water 3.25-4 ft 8 in depth at the [facility] pool. Temp of water was 92.  Pt entered/exited the pool via stairs independently with bilat rail, step-to pattern.   Without UE support:  Forward/ backward walking in 6f, 8" water Side stepping with shoulder abdct/ add  Holding yellow noodle - tap behinds (curtsy) Rt forward step ups, Rt lateral step ups (intermittent UE on wall, just under 4 ft) x 10 each Warrior 3 in water, (SLS with forward lean, UE on  yellow noodle) alternating LE Forward/backward  walking lunges with rainbow hand buoys (<4 ft) Forward kicking in 3.5 ft water Squats pushing blue hand buoy under water 47f water - SLS x 10 sec each, UE skulling to steady Hurdle walk forward in 497fwater Tandem gait forward/ backward just under [id number] knee march with 3 sec pause in SLS just under 4 ft STS at bench x 5, blue step under feet, eccentric lowering Holding wall- side lunge to stretch adductors, cues for form/to slow speed)  7/7: Muscle testing, LEFS, goals discussion for PN Seated LAQ without back support or UE assist- adding core MANUAL: STM to quads, adductors, pectineus, passive abd+ext stretching of Rt hip Adductor  stretching: seated & standing    PATIENT EDUCATION:  Education details: Anatomy of condition, POC, HEP, exercise form/rationale  Person educated: Patient and Spouse Education method: Explanation, Demonstration, Tactile cues, and Verbal cues Education comprehension: verbalized understanding, returned demonstration, verbal cues required, tactile cues required, and needs further education     HOME EXERCISE PROGRAM: [id number]   ASSESSMENT:   CLINICAL IMPRESSION: Was able to complete 2x10 sidelying clams without assist today which is a huge improvement in strength- PT provided tactile cues for direction of movement due to neuropathy but did not help lift knee in clams. Asked that he consistently walk with cane to focus on progression of pelvis over Rt foot in stance phase rather than Rt trunk sidebend.   OBJECTIVE IMPAIRMENTS Abnormal gait, decreased activity tolerance, decreased balance, difficulty walking, decreased strength, impaired sensation, improper body mechanics, postural dysfunction, and pain.    ACTIVITY LIMITATIONS cleaning, community activity, driving, meal prep, laundry, and shopping.    PERSONAL FACTORS  see problem list  are also affecting patient's functional outcome.      REHAB POTENTIAL: Good   CLINICAL DECISION MAKING: Unstable/unpredictable   EVALUATION COMPLEXITY: High     GOALS: Goals reviewed with patient? Yes   SHORT TERM GOALS:   Able to enter/exit pool independently with use of hand rails Baseline: will begin training Target date: 04/03/2022 Goal status: Achieved    2.  Good tolerance to exercise without limitations in next day due to pain Baseline: will establish proper working level Target date: 04/03/2022 Goal status: Ongoing    3.  Able to shift weight to perform step taps with UE assist Baseline: unable at eval Target date: 04/03/2022 Goal status: Achieved    4.  Able to ambulate with centered weight distribution Baseline: at eval- leg abducted  and trunk lean Target date: 04/03/2022 Goal status: Ongoing       LONG TERM GOALS:   BERG to improve by MDC of 6 points Baseline: 33 at eval Target date:  07/06/22 Goal status: achieved   2.  Pt will ambulate safely around his home wihtout use of AD Baseline: achieved Goal status: ongoing   3.  Able to navigate stairs in home safely and confidently Baseline:navigating stairs with "up with the good, down with the bad" pattern  Goal status: ongoing   4.  LEFS to improve by MDC Baseline: MDC is 9 points  Goal status: INITIAL   5.  Pt will demo 5TSTS in 20s or less Baseline: 38s at eval  Goal status: Achieved - 04/19/22   6.  Pt will be able to rid home of aid devices Baseline: taken toilet seats out, uses stool in shower but for convenience, not need; still using walker to carry things  Goal status: partially met  7. Start  walking around the neighborhood, ultimately without AD  Baseline: walking but with cane.     Goal status: ongoing   PLAN: PT FREQUENCY: 1-2x/week   PT DURATION: other: 4 months   PLANNED INTERVENTIONS: Therapeutic exercises, Therapeutic activity, Neuromuscular re-education, Balance training, Gait training, Patient/Family education, Joint mobilization, Stair training, Aquatic Therapy, Electrical stimulation, Cryotherapy, Moist heat, Taping, and Manual therapy   PLAN FOR NEXT SESSION: adductor flexibility. Continue balance in more shallow water. Check pelvis progression when walking in water without AD  Lorenzo Pereyra C. Layton Tappan PT, DPT 07/12/22 11:44 AM

## 2022-07-12 NOTE — Progress Notes (Signed)
Chronic Care Management Pharmacy Assistant   Name: Bruce Ball  MRN: [id number] DOB: 28-Sep-1946   Reason for Encounter: Hypertension Adherence Call    Recent office visits:  None  Recent consult visits:  04/11/2022 OV (Otolaryngology) Wendie Chess, Chrystie Nose, MD; Perioperative antibiotics and steroids -- Augmentin and Prednisone -- Start 7 days prior to procedure.  Hospital visits:  None in previous 6 months  Medications: Outpatient Encounter Medications as of 07/12/2022  Medication Sig   acetaminophen (TYLENOL) 325 MG tablet Take 2 tablets (650 mg total) by mouth every 6 (six) hours as needed for mild pain or moderate pain.   apixaban (ELIQUIS) 2.5 MG TABS tablet Take 1 tablet (2.5 mg total) by mouth 2 (two) times daily.   atorvastatin (LIPITOR) 20 MG tablet Take 1 tablet (20 mg total) by mouth at bedtime.   Calcium Carb-Cholecalciferol 600-10 MG-MCG TABS Take 1 tablet by mouth 2 (two) times daily.   carvedilol (COREG) 25 MG tablet Take 12.5 mg by mouth 2 (two) times daily.   Cholecalciferol (VITAMIN D3) 50 MCG (2000 UT) capsule Take 2,000 Units by mouth in the morning and at bedtime.    DULoxetine (CYMBALTA) 30 MG capsule Take 30 mg by mouth daily.   ferrous sulfate 325 (65 FE) MG tablet Take 325 mg by mouth every Monday, Wednesday, and Friday.   fluticasone (FLONASE) 50 MCG/ACT nasal spray Place 1-2 sprays into both nostrils daily as needed for allergies or rhinitis.   gabapentin (NEURONTIN) 300 MG capsule Take 300 mg by mouth at bedtime.   methocarbamol (ROBAXIN) 500 MG tablet Take 1 tablet by mouth at bedtime as needed for muscle spasms.   metoCLOPramide (REGLAN) 5 MG tablet TAKE 1 TABLET (5 MG TOTAL) BY MOUTH 3 (THREE) TIMES DAILY BEFORE MEALS.   Multiple Vitamins-Minerals (MULTIVITAMIN WITH MINERALS) tablet Take 1 tablet by mouth daily.   pantoprazole (PROTONIX) 40 MG tablet Take 40 mg by mouth 2 (two) times daily before a meal.   traMADol (ULTRAM) 50 MG tablet Take 50 mg by  mouth as needed.   No facility-administered encounter medications on file as of 07/12/2022.   Reviewed chart prior to disease state call. Spoke with patient regarding BP  Recent Office Vitals: BP Readings from Last 3 Encounters:  04/22/22 127/73  03/07/22 (!) 146/80  01/17/22 124/70   Pulse Readings from Last 3 Encounters:  04/22/22 65  03/07/22 (!) 56  01/17/22 90    Wt Readings from Last 3 Encounters:  04/22/22 231 lb 2 oz (104.8 kg)  04/03/22 238 lb (108 kg)  03/07/22 233 lb 12.8 oz (106.1 kg)     Kidney Function Lab Results  Component Value Date/Time   CREATININE 1.35 (H) 12/23/2021 11:21 AM   CREATININE 1.29 12/04/2021 04:08 PM   CREATININE 1.67 (H) 01/25/2021 11:25 AM   CREATININE 1.42 (H) 07/25/2020 11:54 AM   GFR 54.25 (L) 12/04/2021 04:08 PM   GFRNONAA 55 (L) 12/23/2021 11:21 AM   GFRNONAA 43 (L) 01/25/2021 11:25 AM   GFRAA 56 (L) 07/25/2020 11:54 AM       Latest Ref Rng & Units 12/23/2021   11:21 AM 12/04/2021    4:08 PM 10/12/2021    7:34 AM  BMP  Glucose 70 - 99 mg/dL 100  87  121   BUN 8 - 23 mg/dL '23  19  13   '$ Creatinine 0.61 - 1.24 mg/dL 1.35  1.29  1.25   Sodium 135 - 145 mmol/L 141  141  138  Potassium 3.5 - 5.1 mmol/L 3.5  4.8  3.9   Chloride 98 - 111 mmol/L 104  107  107   CO2 22 - 32 mmol/L '28  30  23   '$ Calcium 8.9 - 10.3 mg/dL 9.3  8.9  8.1     Current antihypertensive regimen:  Carvedilol 12.5 mg twice daily  How often are you checking your Blood Pressure? 1-2x per week  Current home BP readings: 125/70  What recent interventions/DTPs have been made by any provider to improve Blood Pressure control since last CPP Visit: No recent interventions or DTPs.  Any recent hospitalizations or ED visits since last visit with CPP? No  What diet changes have been made to improve Blood Pressure Control?  Patient states he has been eating a well balanced diet.  What exercise is being done to improve your Blood Pressure Control?  Patient goes  to physical therapy a few times a week. He states his physical therapy is going well.  Adherence Review: Is the patient currently on ACE/ARB medication? No Does the patient have >5 day gap between last estimated fill dates? No  Care Gaps: Medicare Annual Wellness: Completed 01/25/2022 Hemoglobin A1C: 5.5% on 06/05/2021 Colonoscopy: Completed 09/26/2021  Star Rating Drugs: Atorvastatin - no fill date available  April D Calhoun, [place] Pharmacist Assistant [phone]

## 2022-07-17 ENCOUNTER — Ambulatory Visit (HOSPITAL_BASED_OUTPATIENT_CLINIC_OR_DEPARTMENT_OTHER): Payer: Medicare Other | Admitting: Physical Therapy

## 2022-07-17 ENCOUNTER — Encounter (HOSPITAL_BASED_OUTPATIENT_CLINIC_OR_DEPARTMENT_OTHER): Payer: Self-pay | Admitting: Physical Therapy

## 2022-07-17 DIAGNOSIS — R262 Difficulty in walking, not elsewhere classified: Secondary | ICD-10-CM | POA: Diagnosis not present

## 2022-07-17 DIAGNOSIS — M79604 Pain in right leg: Secondary | ICD-10-CM

## 2022-07-17 DIAGNOSIS — M6281 Muscle weakness (generalized): Secondary | ICD-10-CM

## 2022-07-17 DIAGNOSIS — M25551 Pain in right hip: Secondary | ICD-10-CM

## 2022-07-17 NOTE — Therapy (Signed)
OUTPATIENT PHYSICAL THERAPY TREATMENT NOTE     Patient Name: Bruce Ball MRN: [id number] DOB:12-01-1946, 76 y.o., male Today's Date: 07/17/2022  PCP: [name], MD REFERRING PROVIDER: [name], MD   PT End of Session - 07/17/22 1206     Visit Number 30    Number of Visits 41    Date for PT Re-Evaluation 08/09/22    Authorization Type MCR    Progress Note Due on Visit 34    PT Start Time 1200    PT Stop Time 1240    PT Time Calculation (min) 40 min    Activity Tolerance Patient tolerated treatment well    Behavior During Therapy WFL for tasks assessed/performed                Past Medical History:  Diagnosis Date   Ankylosing spondylitis ([place])    Arthritis of right knee    Basal cell carcinoma    Chronic back pain greater than 3 months duration    Chronic leg pain    CKD (chronic kidney disease), stage II    Coronary artery calcification seen on CAT scan 03/27/2021   Deafness in right ear    Dyspnea    cause by PE   Essential tremor    Extrinsic asthma, unspecified 04/07/2009   PT DENIES   GERD (gastroesophageal reflux disease)    Hearing loss    Hip problem    History of kidney stones    HYPERTENSION 10/29/2007   HYPOTENSION 08/13/2010   LOW BACK PAIN 04/21/2008   Mild aortic stenosis    NEPHROLITHIASIS, HX OF 04/21/2008   Neuropathy    Osteopenia    Other hyperlipidemia    Peptic ulcer    Pneumonia    PUD, HX OF 04/21/2008   Pulmonary emboli ([place]) 2021   noted on CT 12-2019   Renal disorder    Stage 3 chronic kidney disease (HCC)    Thyroid nodule    Past Surgical History:  Procedure Laterality Date   BACK SURGERY     bone cancer  1970   rt femur removed and steel rod placed   CYSTOSCOPY WITH RETROGRADE PYELOGRAM, URETEROSCOPY AND STENT PLACEMENT Right 11/06/2017   Procedure: CYSTOSCOPY WITH  STENT PLACEMENT RIGHT;  Surgeon: Raynelle Bring, MD;  Location: [facility];  Service: Urology;  Laterality: Right;   CYSTOSCOPY WITH  RETROGRADE PYELOGRAM, URETEROSCOPY AND STENT PLACEMENT Left 06/09/2019   Procedure: CYSTOSCOPY WITH RETROGRADE PYELOGRAM, URETEROSCOPY AND STENT PLACEMENT X2;  Surgeon: [name], MD;  Location: [facility];  Service: Urology;  Laterality: Left;   CYSTOSCOPY WITH RETROGRADE PYELOGRAM, URETEROSCOPY AND STENT PLACEMENT Bilateral 03/01/2020   Procedure: CYSTOSCOPY WITH RETROGRADE PYELOGRAM, URETEROSCOPY AND STENT PLACEMENT;  Surgeon: [name], MD;  Location: [facility];  Service: Urology;  Laterality: Bilateral;  75 MINS   CYSTOSCOPY/RETROGRADE/URETEROSCOPY     1 year ago at [place] in [place] Right 11/24/2017   Procedure: CYSTOSCOPY/URETEROSCOPY/ RETROGRADE/STENT REMOVAL;  Surgeon: Raynelle Bring, MD;  Location: [facility];  Service: Urology;  Laterality: Right;   HOLMIUM LASER APPLICATION Bilateral 03/08/5319   Procedure: HOLMIUM LASER APPLICATION;  Surgeon: [name], MD;  Location: [facility];  Service: Urology;  Laterality: Bilateral;   prosatectomy     Patient Active Problem List   Diagnosis Date Noted   History of pulmonary embolism 10/10/2021   HTN (hypertension) 10/10/2021   Chronic diastolic CHF (congestive heart failure) ([place]) 10/10/2021   GERD (gastroesophageal reflux disease) 10/10/2021  Ankylosing spondylitis ([place]) 10/10/2021   CKD (chronic kidney disease), stage III ([place]) 10/10/2021   Hyperlipidemia 10/10/2021   CAD (coronary artery disease) 10/10/2021   Periprosthetic fracture of shaft of femur 10/10/2021   Other fatigue 06/05/2021   CAD (coronary artery disease) 89/38/1017   Diastolic CHF ([place]) 51/01/5851   SOB (shortness of breath) on exertion 03/26/2021   Chronic anticoagulation 03/26/2021   Anemia in chronic kidney disease 01/25/2021   Acquired coagulation disorder ([place]) 12/12/2020   Senile purpura ([place]) 12/12/2020   S/P lumbar fusion 08/03/2020   Thin skin 07/25/2020   Aortic atherosclerosis ([place]) 05/17/2020   B12 deficiency  05/17/2020   Iliac artery aneurysm ([place]) 05/17/2020   Osteoporosis 04/04/2020   Idiopathic neuropathy 01/28/2020   Recurrent falls 01/28/2020   Chronic kidney disease (CKD), stage III (moderate) ([place]) 01/19/2020   Pulmonary emboli ([place]) 01/14/2020   GERD (gastroesophageal reflux disease) 12/22/2019   Essential tremor 12/22/2019   Femoral condyle fracture ([place]) 07/15/2019   Gross hematuria 06/14/2019   Hyperlipidemia 11/11/2017   Kidney stone 11/05/2017   Community acquired pneumonia of right upper lobe of lung 11/05/2017   Right ureteral stone 11/05/2017   History of total right hip replacement 03/08/2017   History of prostate cancer 03/08/2017   Personal history of prostate cancer 02/28/2017   High risk medication use 02/28/2017   DDD (degenerative disc disease), thoracic 02/28/2017   Ataxia 05/18/2016   Vestibular disequilibrium 05/18/2016   Ankylosing spondylitis of cervical region ([place]) 05/24/2014   Lung nodule, solitary 05/24/2014   Lumbosacral stenosis 06/05/2013   Peripheral edema 06/08/2012   Subcortical microvascular ischemic occlusive disease 08/13/2010   Extrinsic asthma 04/07/2009   Backache 04/21/2008   History of peptic ulcer disease 04/21/2008   Essential hypertension 10/29/2007  REFERRING PROVIDER: [name], MD   REFERRING DIAG:  M54.50,G89.29 (ICD-10-CM) - Chronic bilateral low back pain without sciatica  R26.89 (ICD-10-CM) - Other abnormalities of gait and mobility      THERAPY DIAG:  Pain in right hip   Pain in right leg   Difficulty in walking, not elsewhere classified   Muscle weakness (generalized)   ONSET DATE: October 10, 2021 recent fall causing fracture   SUBJECTIVE:                                                                                                                                                                                            SUBJECTIVE STATEMENT: "I feel horrible.  I had to take narcotics last night. "  Pt reporting increased Rt hip and Lt hip/back pain upon arriving.   Pt scheduled for surgery on sinuses  on 09/09/22.    PERTINENT HISTORY:  Stenosis, Rt THA 27 yr ago with h/o periprosthetic fx & revision in 2022, h/o back surgery  Hard of hearing; can hear out of Left ear   PAIN:  Are you having pain? Yes NPRS scale: 8-9/10 Pain location: Rt hip, Lt hip, low back  Description:  Aggravating factors: Rt WB Relieving factors: rest   PRECAUTIONS: Posterior hip   WEIGHT BEARING RESTRICTIONS No   FALLS:  Has patient fallen in last 6 months? Yes, Number of falls: 1 4/21: no falls recently 6/9: denies new falls 7/7: denies falls     PATIENT GOALS build up strength, get rid of assistance for toileting and getting in/out of bed, no AD in gait, stairs, Son's wedding in [place] first of June     OBJECTIVE: *All objective findings taken at [facility] unless otherwise noted.    PATIENT SURVEYS:  LEFS 39 4/21: 46 5/19: 43 6/9: 43 7/7: 43  0 on walking 2 blocks, 1 mile & stiars- would like to see these improve in particular     COGNITION:          Overall cognitive status: Within functional limits for tasks assessed                        SENSATION:          Bil hand/feet neuropathy unchanged since evaluation       LE MMT:  6/9 measured with handheld dynamometry Normative values: hip flexion 38 lb, knee extension 79 lb   MMT Right 03/06/2022 Right/Left 04/19/22 Rt/Lt 05/17/22 Rt/Lt 06/07/22 Rt//Lt 07/05/22  Hip flexion 4/5 11.1/ 24.3 20.9/42.6 34.8/44.2 38.9//42.1  Knee flexion 3/5 32.3/37.9 37.7/55.3 38.7/48.6 42.2//54.9  Knee extension 3/5 12.1/42.4 20.7/60.7 25.8/50.5 24.9//50.6   (Blank rows = not tested)  6/23:unable to lift RtLE against gravity in sidelying       FUNCTIONAL TESTS:  5 times sit to stand: 38s 4/21: 18s without use of UEs 6/9:16s without use of UEs Berg Balance Scale: 33/56 4/21: 40/50 6/9: 44/50   GAIT:   Comments: SPC in Lt hand, rt leg abducted  and externally rotated, antalgic on Rt with pressure placed through cane 4/21: SPC in Lt hand, Rt leg no longer abd /ER; does demo Rt trunk SB in Rt stance phase 6/9: SPC in Lt hand, mild Rt trunk SB in Rt stance phase, maintains upright posture 7/7: with SPC: maint upright posture wihtout Rt sidebend in stance phase; without SPC- cont upright posture and good swing through but does present sidebend to the Rt in Rt stance phase        TODAY'S TREATMENT  7/19  Pt seen for aquatic therapy today.  Treatment took place in water 3.25-4 ft 8 in depth at the [facility] pool. Temp of water was 92.  Pt entered/exited the pool via stairs independently with bilat rail, step-to pattern.   Without UE support:  Forward/ backward walking in 104ft, 8" water Switched to holding yellow noodle with forward/backward walking, side stepping - cues for even step length and less lateral trunk lean Side stepping with shoulder abdct/ add  Staggered stance with noodle lift towards ceiling Holding yellow noodle - tap behinds (curtsy) Braiding Tandem gait forward/ backward 4 ft Forward gait at 4 ft with reciprocal arm swing with associated trunk rotation  Squats at wall, Rt single leg squats at wall Alternating toe taps to 2nd step, intermittent UE to steady R hamstring stretch  7/14 GAIT: progression of right hip over Rt LE with use of SPC in left hand At bar: lateral weight shift to SLS, wide tandem balance- also added head turns Gastroc stretch Sidelying hip abd, clams & reverse clam with assist   7/12  Pt seen for aquatic therapy today.  Treatment took place in water 3.25-4 ft 8 in depth at the [facility] pool. Temp of water was 92.  Pt entered/exited the pool via stairs independently with bilat rail, step-to pattern.   Without UE support:  Forward/ backward walking in 23ft, 8" water Side stepping with shoulder abdct/ add  Holding yellow noodle - tap behinds (curtsy) Rt forward step  ups, Rt lateral step ups (intermittent UE on wall, just under 4 ft) x 10 each Warrior 3 in water, (SLS with forward lean, UE on yellow noodle) alternating LE Forward/backward  walking lunges with rainbow hand buoys (<4 ft) Forward kicking in 3.5 ft water Squats pushing blue hand buoy under water 21ft water - SLS x 10 sec each, UE skulling to steady Hurdle walk forward in 56ft water Tandem gait forward/ backward just under 83ft  High knee march with 3 sec pause in SLS just under 4 ft STS at bench x 5, blue step under feet, eccentric lowering Holding wall- side lunge to stretch adductors, cues for form/to slow speed)  7/7: Muscle testing, LEFS, goals discussion for PN Seated LAQ without back support or UE assist- adding core MANUAL: STM to quads, adductors, pectineus, passive abd+ext stretching of Rt hip Adductor stretching: seated & standing    PATIENT EDUCATION:  Education details: Anatomy of condition, POC, HEP, exercise form/rationale  Person educated: Patient and Spouse Education method: Explanation, Demonstration, Tactile cues, and Verbal cues Education comprehension: verbalized understanding, returned demonstration, verbal cues required, tactile cues required, and needs further education     HOME EXERCISE PROGRAM: [id number]   ASSESSMENT:   CLINICAL IMPRESSION: Pt arrived with elevated pain level, affecting his performance in water.  He required use of UE on noodle with gait to decrease twinges in low back at beginning of session. Spent some time with to focus on progression of pelvis over Rt foot in stance phase rather than Rt trunk sidebend.  When attempting to complete gait in water 46ft in depth without floatation assistance, and cues for reciprocal arms swing with associated trunk rotation, his Rt knee buckled and he had intense pain in Rt hip.  Pain gradually reduced with rest.  His balance with SLS was decreased compared to previous visits.  No new goals met this visit.    OBJECTIVE IMPAIRMENTS Abnormal gait, decreased activity tolerance, decreased balance, difficulty walking, decreased strength, impaired sensation, improper body mechanics, postural dysfunction, and pain.    ACTIVITY LIMITATIONS cleaning, community activity, driving, meal prep, laundry, and shopping.    PERSONAL FACTORS  see problem list  are also affecting patient's functional outcome.      REHAB POTENTIAL: Good   CLINICAL DECISION MAKING: Unstable/unpredictable   EVALUATION COMPLEXITY: High     GOALS: Goals reviewed with patient? Yes   SHORT TERM GOALS:   Able to enter/exit pool independently with use of hand rails Baseline: will begin training Target date: 04/03/2022 Goal status: Achieved    2.  Good tolerance to exercise without limitations in next day due to pain Baseline: will establish proper working level Target date: 04/03/2022 Goal status: Ongoing    3.  Able to shift weight to perform step taps with UE assist Baseline: unable at eval  Target date: 04/03/2022 Goal status: Achieved    4.  Able to ambulate with centered weight distribution Baseline: at eval- leg abducted and trunk lean Target date: 04/03/2022 Goal status: Ongoing       LONG TERM GOALS:   BERG to improve by MDC of 6 points Baseline: 33 at eval Target date:  07/06/22 Goal status: achieved   2.  Pt will ambulate safely around his home wihtout use of AD Baseline: achieved Goal status: ongoing   3.  Able to navigate stairs in home safely and confidently Baseline:navigating stairs with "up with the good, down with the bad" pattern  Goal status: ongoing   4.  LEFS to improve by MDC Baseline: MDC is 9 points  Goal status: INITIAL   5.  Pt will demo 5TSTS in 20s or less Baseline: 38s at eval  Goal status: Achieved - 04/19/22   6.  Pt will be able to rid home of aid devices Baseline: taken toilet seats out, uses stool in shower but for convenience, not need; still using walker to carry  things  Goal status: partially met  7. Start walking around the neighborhood, ultimately without AD  Baseline: walking but with cane.     Goal status: ongoing   PLAN: PT FREQUENCY: 1-2x/week   PT DURATION: other: 4 months   PLANNED INTERVENTIONS: Therapeutic exercises, Therapeutic activity, Neuromuscular re-education, Balance training, Gait training, Patient/Family education, Joint mobilization, Stair training, Aquatic Therapy, Electrical stimulation, Cryotherapy, Moist heat, Taping, and Manual therapy   PLAN FOR NEXT SESSION: adductor flexibility. Continue balance in more shallow water. Check pelvis progression when walking in water/on land without AD  Kerin Perna, PTA 07/17/22 2:01 PM

## 2022-07-19 ENCOUNTER — Encounter (HOSPITAL_BASED_OUTPATIENT_CLINIC_OR_DEPARTMENT_OTHER): Payer: Self-pay | Admitting: Physical Therapy

## 2022-07-19 ENCOUNTER — Ambulatory Visit (HOSPITAL_BASED_OUTPATIENT_CLINIC_OR_DEPARTMENT_OTHER): Payer: Medicare Other | Admitting: Physical Therapy

## 2022-07-19 DIAGNOSIS — R262 Difficulty in walking, not elsewhere classified: Secondary | ICD-10-CM | POA: Diagnosis not present

## 2022-07-19 DIAGNOSIS — M25551 Pain in right hip: Secondary | ICD-10-CM

## 2022-07-19 DIAGNOSIS — M6281 Muscle weakness (generalized): Secondary | ICD-10-CM | POA: Diagnosis not present

## 2022-07-19 DIAGNOSIS — M79604 Pain in right leg: Secondary | ICD-10-CM

## 2022-07-19 NOTE — Therapy (Signed)
OUTPATIENT PHYSICAL THERAPY TREATMENT NOTE     Patient Name: Bruce Ball MRN: [id number] DOB:07/22/1946, 76 y.o., male Today's Date: 07/19/2022  PCP: [name], MD REFERRING PROVIDER: [name], MD   PT End of Session - 07/19/22 1108     Visit Number 31    Number of Visits 71    Date for PT Re-Evaluation 08/09/22    Authorization Type MCR    Progress Note Due on Visit 55    PT Start Time 1103    PT Stop Time 1144    PT Time Calculation (min) 41 min    Activity Tolerance Patient tolerated treatment well    Behavior During Therapy WFL for tasks assessed/performed                Past Medical History:  Diagnosis Date   Ankylosing spondylitis ([place])    Arthritis of right knee    Basal cell carcinoma    Chronic back pain greater than 3 months duration    Chronic leg pain    CKD (chronic kidney disease), stage II    Coronary artery calcification seen on CAT scan 03/27/2021   Deafness in right ear    Dyspnea    cause by PE   Essential tremor    Extrinsic asthma, unspecified 04/07/2009   PT DENIES   GERD (gastroesophageal reflux disease)    Hearing loss    Hip problem    History of kidney stones    HYPERTENSION 10/29/2007   HYPOTENSION 08/13/2010   LOW BACK PAIN 04/21/2008   Mild aortic stenosis    NEPHROLITHIASIS, HX OF 04/21/2008   Neuropathy    Osteopenia    Other hyperlipidemia    Peptic ulcer    Pneumonia    PUD, HX OF 04/21/2008   Pulmonary emboli ([place]) 2021   noted on CT 12-2019   Renal disorder    Stage 3 chronic kidney disease (HCC)    Thyroid nodule    Past Surgical History:  Procedure Laterality Date   BACK SURGERY     bone cancer  1970   rt femur removed and steel rod placed   CYSTOSCOPY WITH RETROGRADE PYELOGRAM, URETEROSCOPY AND STENT PLACEMENT Right 11/06/2017   Procedure: CYSTOSCOPY WITH  STENT PLACEMENT RIGHT;  Surgeon: Raynelle Bring, MD;  Location: [facility];  Service: Urology;  Laterality: Right;   CYSTOSCOPY WITH  RETROGRADE PYELOGRAM, URETEROSCOPY AND STENT PLACEMENT Left 06/09/2019   Procedure: CYSTOSCOPY WITH RETROGRADE PYELOGRAM, URETEROSCOPY AND STENT PLACEMENT X2;  Surgeon: [name], MD;  Location: [facility];  Service: Urology;  Laterality: Left;   CYSTOSCOPY WITH RETROGRADE PYELOGRAM, URETEROSCOPY AND STENT PLACEMENT Bilateral 03/01/2020   Procedure: CYSTOSCOPY WITH RETROGRADE PYELOGRAM, URETEROSCOPY AND STENT PLACEMENT;  Surgeon: [name], MD;  Location: [facility];  Service: Urology;  Laterality: Bilateral;  75 MINS   CYSTOSCOPY/RETROGRADE/URETEROSCOPY     1 year ago at [place] in [place] Right 11/24/2017   Procedure: CYSTOSCOPY/URETEROSCOPY/ RETROGRADE/STENT REMOVAL;  Surgeon: Raynelle Bring, MD;  Location: [facility];  Service: Urology;  Laterality: Right;   HOLMIUM LASER APPLICATION Bilateral 09/01/4267   Procedure: HOLMIUM LASER APPLICATION;  Surgeon: [name], MD;  Location: [facility];  Service: Urology;  Laterality: Bilateral;   prosatectomy     Patient Active Problem List   Diagnosis Date Noted   History of pulmonary embolism 10/10/2021   HTN (hypertension) 10/10/2021   Chronic diastolic CHF (congestive heart failure) ([place]) 10/10/2021   GERD (gastroesophageal reflux disease) 10/10/2021  Ankylosing spondylitis ([place]) 10/10/2021   CKD (chronic kidney disease), stage III ([place]) 10/10/2021   Hyperlipidemia 10/10/2021   CAD (coronary artery disease) 10/10/2021   Periprosthetic fracture of shaft of femur 10/10/2021   Other fatigue 06/05/2021   CAD (coronary artery disease) 89/38/1017   Diastolic CHF ([place]) 51/01/5851   SOB (shortness of breath) on exertion 03/26/2021   Chronic anticoagulation 03/26/2021   Anemia in chronic kidney disease 01/25/2021   Acquired coagulation disorder ([place]) 12/12/2020   Senile purpura ([place]) 12/12/2020   S/P lumbar fusion 08/03/2020   Thin skin 07/25/2020   Aortic atherosclerosis ([place]) 05/17/2020   B12 deficiency  05/17/2020   Iliac artery aneurysm ([place]) 05/17/2020   Osteoporosis 04/04/2020   Idiopathic neuropathy 01/28/2020   Recurrent falls 01/28/2020   Chronic kidney disease (CKD), stage III (moderate) ([place]) 01/19/2020   Pulmonary emboli ([place]) 01/14/2020   GERD (gastroesophageal reflux disease) 12/22/2019   Essential tremor 12/22/2019   Femoral condyle fracture ([place]) 07/15/2019   Gross hematuria 06/14/2019   Hyperlipidemia 11/11/2017   Kidney stone 11/05/2017   Community acquired pneumonia of right upper lobe of lung 11/05/2017   Right ureteral stone 11/05/2017   History of total right hip replacement 03/08/2017   History of prostate cancer 03/08/2017   Personal history of prostate cancer 02/28/2017   High risk medication use 02/28/2017   DDD (degenerative disc disease), thoracic 02/28/2017   Ataxia 05/18/2016   Vestibular disequilibrium 05/18/2016   Ankylosing spondylitis of cervical region ([place]) 05/24/2014   Lung nodule, solitary 05/24/2014   Lumbosacral stenosis 06/05/2013   Peripheral edema 06/08/2012   Subcortical microvascular ischemic occlusive disease 08/13/2010   Extrinsic asthma 04/07/2009   Backache 04/21/2008   History of peptic ulcer disease 04/21/2008   Essential hypertension 10/29/2007  REFERRING PROVIDER: [name], MD   REFERRING DIAG:  M54.50,G89.29 (ICD-10-CM) - Chronic bilateral low back pain without sciatica  R26.89 (ICD-10-CM) - Other abnormalities of gait and mobility      THERAPY DIAG:  Pain in right hip   Pain in right leg   Difficulty in walking, not elsewhere classified   Muscle weakness (generalized)   ONSET DATE: October 10, 2021 recent fall causing fracture   SUBJECTIVE:                                                                                                                                                                                            SUBJECTIVE STATEMENT: "I feel horrible.  I had to take narcotics last night. "  Pt reporting increased Rt hip and Lt hip/back pain upon arriving.   Pt scheduled for surgery on sinuses  on 09/09/22.    PERTINENT HISTORY:  Stenosis, Rt THA 28 yr ago with h/o periprosthetic fx & revision in 2022, h/o back surgery  Hard of hearing; can hear out of Left ear   PAIN:  Are you having pain? Yes NPRS scale: 8-9/10 Pain location: Rt hip, Lt hip, low back  Description:  Aggravating factors: Rt WB Relieving factors: rest   PRECAUTIONS: Posterior hip   WEIGHT BEARING RESTRICTIONS No   FALLS:  Has patient fallen in last 6 months? Yes, Number of falls: 1 4/21: no falls recently 6/9: denies new falls 7/7: denies falls     PATIENT GOALS build up strength, get rid of assistance for toileting and getting in/out of bed, no AD in gait, stairs, Son's wedding in [place] first of June     OBJECTIVE: *All objective findings taken at [facility] unless otherwise noted.    PATIENT SURVEYS:  LEFS 39 4/21: 46 5/19: 43 6/9: 43 7/7: 43  0 on walking 2 blocks, 1 mile & stiars- would like to see these improve in particular     COGNITION:          Overall cognitive status: Within functional limits for tasks assessed                        SENSATION:          Bil hand/feet neuropathy unchanged since evaluation       LE MMT:  6/9 measured with handheld dynamometry Normative values: hip flexion 38 lb, knee extension 79 lb   MMT Right 03/06/2022 Right/Left 04/19/22 Rt/Lt 05/17/22 Rt/Lt 06/07/22 Rt//Lt 07/05/22  Hip flexion 4/5 11.1/ 24.3 20.9/42.6 34.8/44.2 38.9//42.1  Knee flexion 3/5 32.3/37.9 37.7/55.3 38.7/48.6 42.2//54.9  Knee extension 3/5 12.1/42.4 20.7/60.7 25.8/50.5 24.9//50.6   (Blank rows = not tested)  6/23:unable to lift RtLE against gravity in sidelying       FUNCTIONAL TESTS:  5 times sit to stand: 38s 4/21: 18s without use of UEs 6/9:16s without use of UEs Berg Balance Scale: 33/56 4/21: 40/50 6/9: 44/50   GAIT:   Comments: SPC in Lt hand, rt leg abducted  and externally rotated, antalgic on Rt with pressure placed through cane 4/21: SPC in Lt hand, Rt leg no longer abd /ER; does demo Rt trunk SB in Rt stance phase 6/9: SPC in Lt hand, mild Rt trunk SB in Rt stance phase, maintains upright posture 7/7: with SPC: maint upright posture wihtout Rt sidebend in stance phase; without SPC- cont upright posture and good swing through but does present sidebend to the Rt in Rt stance phase        TODAY'S TREATMENT  7/21:  Nu step 5 min L10, UE & LE MANUAL: passive adductor stretching, STM & rolling to glut med Assisted in obtaining hooklying stretch over 3lb weight fot post musculature  Supine march+adduction with sheet & UE assist with twin sheet- finding end range stretch in abductors  Active-assissted heel slidewith pauses for HS activation  Supine abduction with quad set   7/19  Pt seen for aquatic therapy today.  Treatment took place in water 3.25-4 ft 8 in depth at the [facility] pool. Temp of water was 92.  Pt entered/exited the pool via stairs independently with bilat rail, step-to pattern.   Without UE support:  Forward/ backward walking in 85f, 8" water Switched to holding yellow noodle with forward/backward walking, side stepping - cues for even step length and less lateral trunk lean  Side stepping with shoulder abdct/ add  Staggered stance with noodle lift towards ceiling Holding yellow noodle - tap behinds (curtsy) Braiding Tandem gait forward/ backward 4 ft Forward gait at 4 ft with reciprocal arm swing with associated trunk rotation  Squats at wall, Rt single leg squats at wall Alternating toe taps to 2nd step, intermittent UE to steady R hamstring stretch  7/14 GAIT: progression of right hip over Rt LE with use of SPC in left hand At bar: lateral weight shift to SLS, wide tandem balance- also added head turns Gastroc stretch Sidelying hip abd, clams & reverse clam with assist     PATIENT EDUCATION:   Education details: Anatomy of condition, POC, HEP, exercise form/rationale  Person educated: Patient and Spouse Education method: Explanation, Demonstration, Tactile cues, and Verbal cues Education comprehension: verbalized understanding, returned demonstration, verbal cues required, tactile cues required, and needs further education     HOME EXERCISE PROGRAM: [id number]   ASSESSMENT:   CLINICAL IMPRESSION: Severe tightness in Rt glut med and was very TTP today- likely contributor to pain when performing closed chain trunk rotation in gait.   OBJECTIVE IMPAIRMENTS Abnormal gait, decreased activity tolerance, decreased balance, difficulty walking, decreased strength, impaired sensation, improper body mechanics, postural dysfunction, and pain.    ACTIVITY LIMITATIONS cleaning, community activity, driving, meal prep, laundry, and shopping.    PERSONAL FACTORS  see problem list  are also affecting patient's functional outcome.      REHAB POTENTIAL: Good   CLINICAL DECISION MAKING: Unstable/unpredictable   EVALUATION COMPLEXITY: High     GOALS: Goals reviewed with patient? Yes   SHORT TERM GOALS:   Able to enter/exit pool independently with use of hand rails Baseline: will begin training Target date: 04/03/2022 Goal status: Achieved    2.  Good tolerance to exercise without limitations in next day due to pain Baseline: will establish proper working level Target date: 04/03/2022 Goal status: Ongoing    3.  Able to shift weight to perform step taps with UE assist Baseline: unable at eval Target date: 04/03/2022 Goal status: Achieved    4.  Able to ambulate with centered weight distribution Baseline: at eval- leg abducted and trunk lean Target date: 04/03/2022 Goal status: Ongoing       LONG TERM GOALS:   BERG to improve by MDC of 6 points Baseline: 33 at eval Target date:  07/06/22 Goal status: achieved   2.  Pt will ambulate safely around his home wihtout use of  AD Baseline: achieved Goal status: ongoing   3.  Able to navigate stairs in home safely and confidently Baseline:navigating stairs with "up with the good, down with the bad" pattern  Goal status: ongoing   4.  LEFS to improve by MDC Baseline: MDC is 9 points  Goal status: INITIAL   5.  Pt will demo 5TSTS in 20s or less Baseline: 38s at eval  Goal status: Achieved - 04/19/22   6.  Pt will be able to rid home of aid devices Baseline: taken toilet seats out, uses stool in shower but for convenience, not need; still using walker to carry things  Goal status: partially met  7. Start walking around the neighborhood, ultimately without AD  Baseline: walking but with cane.     Goal status: ongoing   PLAN: PT FREQUENCY: 1-2x/week   PT DURATION: other: 4 months   PLANNED INTERVENTIONS: Therapeutic exercises, Therapeutic activity, Neuromuscular re-education, Balance training, Gait training, Patient/Family education, Joint mobilization, Stair training, Aquatic Therapy,  Electrical stimulation, Cryotherapy, Moist heat, Taping, and Manual therapy   PLAN FOR NEXT SESSION: hip abductor soft tissue & strengthening  Jessica C. Hightower PT, DPT 07/19/22 12:43 PM

## 2022-07-24 ENCOUNTER — Ambulatory Visit (HOSPITAL_BASED_OUTPATIENT_CLINIC_OR_DEPARTMENT_OTHER): Payer: Medicare Other | Admitting: Physical Therapy

## 2022-07-24 ENCOUNTER — Encounter (HOSPITAL_BASED_OUTPATIENT_CLINIC_OR_DEPARTMENT_OTHER): Payer: Self-pay | Admitting: Physical Therapy

## 2022-07-24 DIAGNOSIS — M79604 Pain in right leg: Secondary | ICD-10-CM

## 2022-07-24 DIAGNOSIS — R262 Difficulty in walking, not elsewhere classified: Secondary | ICD-10-CM

## 2022-07-24 DIAGNOSIS — M25551 Pain in right hip: Secondary | ICD-10-CM | POA: Diagnosis not present

## 2022-07-24 DIAGNOSIS — M6281 Muscle weakness (generalized): Secondary | ICD-10-CM

## 2022-07-24 NOTE — Therapy (Signed)
OUTPATIENT PHYSICAL THERAPY TREATMENT NOTE     Patient Name: Bruce Ball MRN: [id number] DOB:Jan 19, 1946, 76 y.o., male Today's Date: 07/24/2022  PCP: [name], MD REFERRING PROVIDER: [name], MD   PT End of Session - 07/24/22 1333     Visit Number 32    Number of Visits 7    Date for PT Re-Evaluation 08/09/22    Authorization Type MCR    Progress Note Due on Visit 63    PT Start Time 1330    PT Stop Time 4132    PT Time Calculation (min) 45 min    Activity Tolerance Patient tolerated treatment well    Behavior During Therapy WFL for tasks assessed/performed                Past Medical History:  Diagnosis Date   Ankylosing spondylitis ([place])    Arthritis of right knee    Basal cell carcinoma    Chronic back pain greater than 3 months duration    Chronic leg pain    CKD (chronic kidney disease), stage II    Coronary artery calcification seen on CAT scan 03/27/2021   Deafness in right ear    Dyspnea    cause by PE   Essential tremor    Extrinsic asthma, unspecified 04/07/2009   PT DENIES   GERD (gastroesophageal reflux disease)    Hearing loss    Hip problem    History of kidney stones    HYPERTENSION 10/29/2007   HYPOTENSION 08/13/2010   LOW BACK PAIN 04/21/2008   Mild aortic stenosis    NEPHROLITHIASIS, HX OF 04/21/2008   Neuropathy    Osteopenia    Other hyperlipidemia    Peptic ulcer    Pneumonia    PUD, HX OF 04/21/2008   Pulmonary emboli ([place]) 2021   noted on CT 12-2019   Renal disorder    Stage 3 chronic kidney disease (HCC)    Thyroid nodule    Past Surgical History:  Procedure Laterality Date   BACK SURGERY     bone cancer  1970   rt femur removed and steel rod placed   [place], URETEROSCOPY AND STENT PLACEMENT Right 11/06/2017   Procedure: CYSTOSCOPY WITH  STENT PLACEMENT RIGHT;  Surgeon: Raynelle Bring, MD;  Location: [facility];  Service: Urology;  Laterality: Right;   CYSTOSCOPY WITH  RETROGRADE PYELOGRAM, URETEROSCOPY AND STENT PLACEMENT Left 06/09/2019   Procedure: CYSTOSCOPY WITH RETROGRADE PYELOGRAM, URETEROSCOPY AND STENT PLACEMENT X2;  Surgeon: [name], MD;  Location: [facility];  Service: Urology;  Laterality: Left;   CYSTOSCOPY WITH RETROGRADE PYELOGRAM, URETEROSCOPY AND STENT PLACEMENT Bilateral 03/01/2020   Procedure: CYSTOSCOPY WITH RETROGRADE PYELOGRAM, URETEROSCOPY AND STENT PLACEMENT;  Surgeon: [name], MD;  Location: [facility];  Service: Urology;  Laterality: Bilateral;  75 MINS   CYSTOSCOPY/RETROGRADE/URETEROSCOPY     1 year ago at [place] in [place] Right 11/24/2017   Procedure: CYSTOSCOPY/URETEROSCOPY/ RETROGRADE/STENT REMOVAL;  Surgeon: Raynelle Bring, MD;  Location: [facility];  Service: Urology;  Laterality: Right;   HOLMIUM LASER APPLICATION Bilateral 04/02/101   Procedure: HOLMIUM LASER APPLICATION;  Surgeon: [name], MD;  Location: [facility];  Service: Urology;  Laterality: Bilateral;   prosatectomy     Patient Active Problem List   Diagnosis Date Noted   History of pulmonary embolism 10/10/2021   HTN (hypertension) 10/10/2021   Chronic diastolic CHF (congestive heart failure) ([place]) 10/10/2021   GERD (gastroesophageal reflux disease) 10/10/2021  Ankylosing spondylitis ([place]) 10/10/2021   CKD (chronic kidney disease), stage III ([place]) 10/10/2021   Hyperlipidemia 10/10/2021   CAD (coronary artery disease) 10/10/2021   Periprosthetic fracture of shaft of femur 10/10/2021   Other fatigue 06/05/2021   CAD (coronary artery disease) 75/88/3254   Diastolic CHF ([place]) 98/26/4158   SOB (shortness of breath) on exertion 03/26/2021   Chronic anticoagulation 03/26/2021   Anemia in chronic kidney disease 01/25/2021   Acquired coagulation disorder ([place]) 12/12/2020   Senile purpura ([place]) 12/12/2020   S/P lumbar fusion 08/03/2020   Thin skin 07/25/2020   Aortic atherosclerosis ([place]) 05/17/2020   B12 deficiency  05/17/2020   Iliac artery aneurysm ([place]) 05/17/2020   Osteoporosis 04/04/2020   Idiopathic neuropathy 01/28/2020   Recurrent falls 01/28/2020   Chronic kidney disease (CKD), stage III (moderate) ([place]) 01/19/2020   Pulmonary emboli ([place]) 01/14/2020   GERD (gastroesophageal reflux disease) 12/22/2019   Essential tremor 12/22/2019   Femoral condyle fracture ([place]) 07/15/2019   Gross hematuria 06/14/2019   Hyperlipidemia 11/11/2017   Kidney stone 11/05/2017   Community acquired pneumonia of right upper lobe of lung 11/05/2017   Right ureteral stone 11/05/2017   History of total right hip replacement 03/08/2017   History of prostate cancer 03/08/2017   Personal history of prostate cancer 02/28/2017   High risk medication use 02/28/2017   DDD (degenerative disc disease), thoracic 02/28/2017   Ataxia 05/18/2016   Vestibular disequilibrium 05/18/2016   Ankylosing spondylitis of cervical region ([place]) 05/24/2014   Lung nodule, solitary 05/24/2014   Lumbosacral stenosis 06/05/2013   Peripheral edema 06/08/2012   Subcortical microvascular ischemic occlusive disease 08/13/2010   Extrinsic asthma 04/07/2009   Backache 04/21/2008   History of peptic ulcer disease 04/21/2008   Essential hypertension 10/29/2007  REFERRING PROVIDER: [name], MD   REFERRING DIAG:  M54.50,G89.29 (ICD-10-CM) - Chronic bilateral low back pain without sciatica  R26.89 (ICD-10-CM) - Other abnormalities of gait and mobility      THERAPY DIAG:  Pain in right hip   Pain in right leg   Difficulty in walking, not elsewhere classified   Muscle weakness (generalized)   ONSET DATE: October 10, 2021 recent fall causing fracture   SUBJECTIVE:                                                                                                                                                                                            SUBJECTIVE STATEMENT: "Feel better than I did last visit"  Pain about normal,  same areas".   Pt scheduled for surgery on sinuses on 09/09/22.    PERTINENT HISTORY:  Stenosis, Rt  THA 61 yr ago with h/o periprosthetic fx & revision in 2022, h/o back surgery  Hard of hearing; can hear out of Left ear   PAIN:  Are you having pain? Yes NPRS scale: 6-8/10 Pain location: Rt hip, Lt hip, low back  Description:  Aggravating factors: Rt WB Relieving factors: rest   PRECAUTIONS: Posterior hip   WEIGHT BEARING RESTRICTIONS No   FALLS:  Has patient fallen in last 6 months? Yes, Number of falls: 1 4/21: no falls recently 6/9: denies new falls 7/7: denies falls     PATIENT GOALS build up strength, get rid of assistance for toileting and getting in/out of bed, no AD in gait, stairs, Son's wedding in [place] first of June     OBJECTIVE: *All objective findings taken at [facility] unless otherwise noted.    PATIENT SURVEYS:  LEFS 39 4/21: 46 5/19: 43 6/9: 43 7/7: 43  0 on walking 2 blocks, 1 mile & stiars- would like to see these improve in particular     COGNITION:          Overall cognitive status: Within functional limits for tasks assessed                        SENSATION:          Bil hand/feet neuropathy unchanged since evaluation       LE MMT:  6/9 measured with handheld dynamometry Normative values: hip flexion 38 lb, knee extension 79 lb   MMT Right 03/06/2022 Right/Left 04/19/22 Rt/Lt 05/17/22 Rt/Lt 06/07/22 Rt//Lt 07/05/22  Hip flexion 4/5 11.1/ 24.3 20.9/42.6 34.8/44.2 38.9//42.1  Knee flexion 3/5 32.3/37.9 37.7/55.3 38.7/48.6 42.2//54.9  Knee extension 3/5 12.1/42.4 20.7/60.7 25.8/50.5 24.9//50.6   (Blank rows = not tested)  6/23:unable to lift RtLE against gravity in sidelying       FUNCTIONAL TESTS:  5 times sit to stand: 38s 4/21: 18s without use of UEs 6/9:16s without use of UEs Berg Balance Scale: 33/56 4/21: 40/50 6/9: 44/50   GAIT:   Comments: SPC in Lt hand, rt leg abducted and externally rotated, antalgic on Rt with pressure  placed through cane 4/21: SPC in Lt hand, Rt leg no longer abd /ER; does demo Rt trunk SB in Rt stance phase 6/9: SPC in Lt hand, mild Rt trunk SB in Rt stance phase, maintains upright posture 7/7: with SPC: maint upright posture wihtout Rt sidebend in stance phase; without SPC- cont upright posture and good swing through but does present sidebend to the Rt in Rt stance phase        TODAY'S TREATMENT    Pt seen for aquatic therapy today.  Treatment took place in water 3.25-4 ft 8 in depth at the [facility] pool. Temp of water was 92.  Pt entered/exited the pool via stairs independently with bilat rail, step-to pattern.   Without UE support:  Forward/ backward walking in 98f, 8" water Side stepping with shoulder abdct/ add 1 foam hand buoy Holding yellow noodle - tap behinds (curtsy) Braiding Tandem gait forward/ backward 4 ft Squats at wall, Rt single leg squats at wall Alternating toe taps to 2nd step, intermittent UE to steady Adductor/ hamstring/ gastroc stretch seated on 3rd step STS 3rd step. Cues and demonstration for weight shift/execution completes 6/7x  7/21:  Nu step 5 min L10, UE & LE MANUAL: passive adductor stretching, STM & rolling to glut med Assisted in obtaining hooklying stretch over 3lb weight fot post musculature  Supine march+adduction with sheet & UE assist with twin sheet- finding end range stretch in abductors  Active-assissted heel slidewith pauses for HS activation  Supine abduction with quad set   7/19  Pt seen for aquatic therapy today.  Treatment took place in water 3.25-4 ft 8 in depth at the [facility] pool. Temp of water was 92.  Pt entered/exited the pool via stairs independently with bilat rail, step-to pattern.   Without UE support:  Forward/ backward walking in 81f, 8" water Switched to holding yellow noodle with forward/backward walking, side stepping - cues for even step length and less lateral trunk lean Side  stepping with shoulder abdct/ add Staggered stance with noodle lift towards ceiling Holding yellow noodle - tap behinds (curtsy) Braiding Tandem gait forward/ backward 4 ft Forward gait at 4 ft with reciprocal arm swing with associated trunk rotation  Squats at wall, Rt single leg squats at wall Alternating toe taps to 2nd step, intermittent UE to steady R hamstring stretch  7/14 GAIT: progression of right hip over Rt LE with use of SPC in left hand At bar: lateral weight shift to SLS, wide tandem balance- also added head turns Gastroc stretch Sidelying hip abd, clams & reverse clam with assist     PATIENT EDUCATION:  Education details: Anatomy of condition, POC, HEP, exercise form/rationale  Person educated: Patient and Spouse Education method: Explanation, Demonstration, Tactile cues, and Verbal cues Education comprehension: verbalized understanding, returned demonstration, verbal cues required, tactile cues required, and needs further education     HOME EXERCISE PROGRAM: [id number]   ASSESSMENT:   CLINICAL IMPRESSION: Less weight shift onto rle with reduced submersion (3 ft) and reciprocal LE movements. Focused on glut medius stretching and strengthening. Had sharp pain right hip (he states it is bursa related) towards end of session.  Limited his ability to complete treatment.  Goals ongoing.    OBJECTIVE IMPAIRMENTS Abnormal gait, decreased activity tolerance, decreased balance, difficulty walking, decreased strength, impaired sensation, improper body mechanics, postural dysfunction, and pain.    ACTIVITY LIMITATIONS cleaning, community activity, driving, meal prep, laundry, and shopping.    PERSONAL FACTORS  see problem list  are also affecting patient's functional outcome.      REHAB POTENTIAL: Good   CLINICAL DECISION MAKING: Unstable/unpredictable   EVALUATION COMPLEXITY: High     GOALS: Goals reviewed with patient? Yes   SHORT TERM GOALS:   Able to  enter/exit pool independently with use of hand rails Baseline: will begin training Target date: 04/03/2022 Goal status: Achieved    2.  Good tolerance to exercise without limitations in next day due to pain Baseline: will establish proper working level Target date: 04/03/2022 Goal status: Ongoing    3.  Able to shift weight to perform step taps with UE assist Baseline: unable at eval Target date: 04/03/2022 Goal status: Achieved    4.  Able to ambulate with centered weight distribution Baseline: at eval- leg abducted and trunk lean Target date: 04/03/2022 Goal status: Ongoing       LONG TERM GOALS:   BERG to improve by MDC of 6 points Baseline: 33 at eval Target date:  07/06/22 Goal status: achieved   2.  Pt will ambulate safely around his home wihtout use of AD Baseline: achieved Goal status: ongoing   3.  Able to navigate stairs in home safely and confidently Baseline:navigating stairs with "up with the good, down with the bad" pattern  Goal status: ongoing   4.  LEFS to  improve by MDC Baseline: MDC is 9 points  Goal status: INITIAL   5.  Pt will demo 5TSTS in 20s or less Baseline: 38s at eval  Goal status: Achieved - 04/19/22   6.  Pt will be able to rid home of aid devices Baseline: taken toilet seats out, uses stool in shower but for convenience, not need; still using walker to carry things  Goal status: partially met  7. Start walking around the neighborhood, ultimately without AD  Baseline: walking but with cane.     Goal status: ongoing   PLAN: PT FREQUENCY: 1-2x/week   PT DURATION: other: 4 months   PLANNED INTERVENTIONS: Therapeutic exercises, Therapeutic activity, Neuromuscular re-education, Balance training, Gait training, Patient/Family education, Joint mobilization, Stair training, Aquatic Therapy, Electrical stimulation, Cryotherapy, Moist heat, Taping, and Manual therapy   PLAN FOR NEXT SESSION: hip abductor soft tissue & strengthening  Annamarie Major) Kennett Symes MPT 07/24/22 2:41 PM

## 2022-07-26 ENCOUNTER — Ambulatory Visit (HOSPITAL_BASED_OUTPATIENT_CLINIC_OR_DEPARTMENT_OTHER): Payer: Medicare Other | Admitting: Physical Therapy

## 2022-07-26 ENCOUNTER — Encounter (HOSPITAL_BASED_OUTPATIENT_CLINIC_OR_DEPARTMENT_OTHER): Payer: Self-pay | Admitting: Physical Therapy

## 2022-07-26 DIAGNOSIS — M25551 Pain in right hip: Secondary | ICD-10-CM | POA: Diagnosis not present

## 2022-07-26 DIAGNOSIS — R262 Difficulty in walking, not elsewhere classified: Secondary | ICD-10-CM

## 2022-07-26 DIAGNOSIS — M79604 Pain in right leg: Secondary | ICD-10-CM | POA: Diagnosis not present

## 2022-07-26 DIAGNOSIS — M6281 Muscle weakness (generalized): Secondary | ICD-10-CM | POA: Diagnosis not present

## 2022-07-26 NOTE — Therapy (Signed)
OUTPATIENT PHYSICAL THERAPY TREATMENT NOTE     Patient Name: Bruce Ball MRN: [id number] DOB:11/10/1946, 76 y.o., male Today's Date: 07/26/2022  PCP: [name], MD REFERRING PROVIDER: [name], MD   PT End of Session - 07/26/22 1140     Visit Number 33    Number of Visits 27    Date for PT Re-Evaluation 08/09/22    Authorization Type MCR    Progress Note Due on Visit 21    PT Start Time 1100    PT Stop Time 1140    PT Time Calculation (min) 40 min    Activity Tolerance Patient tolerated treatment well    Behavior During Therapy WFL for tasks assessed/performed                 Past Medical History:  Diagnosis Date   Ankylosing spondylitis ([place])    Arthritis of right knee    Basal cell carcinoma    Chronic back pain greater than 3 months duration    Chronic leg pain    CKD (chronic kidney disease), stage II    Coronary artery calcification seen on CAT scan 03/27/2021   Deafness in right ear    Dyspnea    cause by PE   Essential tremor    Extrinsic asthma, unspecified 04/07/2009   PT DENIES   GERD (gastroesophageal reflux disease)    Hearing loss    Hip problem    History of kidney stones    HYPERTENSION 10/29/2007   HYPOTENSION 08/13/2010   LOW BACK PAIN 04/21/2008   Mild aortic stenosis    NEPHROLITHIASIS, HX OF 04/21/2008   Neuropathy    Osteopenia    Other hyperlipidemia    Peptic ulcer    Pneumonia    PUD, HX OF 04/21/2008   Pulmonary emboli ([place]) 2021   noted on CT 12-2019   Renal disorder    Stage 3 chronic kidney disease (HCC)    Thyroid nodule    Past Surgical History:  Procedure Laterality Date   BACK SURGERY     bone cancer  1970   rt femur removed and steel rod placed   CYSTOSCOPY WITH RETROGRADE PYELOGRAM, URETEROSCOPY AND STENT PLACEMENT Right 11/06/2017   Procedure: CYSTOSCOPY WITH  STENT PLACEMENT RIGHT;  Surgeon: Raynelle Bring, MD;  Location: [facility];  Service: Urology;  Laterality: Right;   CYSTOSCOPY WITH  RETROGRADE PYELOGRAM, URETEROSCOPY AND STENT PLACEMENT Left 06/09/2019   Procedure: CYSTOSCOPY WITH RETROGRADE PYELOGRAM, URETEROSCOPY AND STENT PLACEMENT X2;  Surgeon: [name], MD;  Location: [facility];  Service: Urology;  Laterality: Left;   CYSTOSCOPY WITH RETROGRADE PYELOGRAM, URETEROSCOPY AND STENT PLACEMENT Bilateral 03/01/2020   Procedure: CYSTOSCOPY WITH RETROGRADE PYELOGRAM, URETEROSCOPY AND STENT PLACEMENT;  Surgeon: [name], MD;  [facility]: [facility];  Service: Urology;  Laterality: Bilateral;  75 MINS   CYSTOSCOPY/RETROGRADE/URETEROSCOPY     1 year ago at [place] in [place] Right 11/24/2017   Procedure: CYSTOSCOPY/URETEROSCOPY/ RETROGRADE/STENT REMOVAL;  Surgeon: Raynelle Bring, MD;  [facility]: [facility];  Service: Urology;  Laterality: Right;   HOLMIUM LASER APPLICATION Bilateral 05/08/7415   Procedure: HOLMIUM LASER APPLICATION;  Surgeon: [name], MD;  Location: [facility];  Service: Urology;  Laterality: Bilateral;   prosatectomy     Patient Active Problem List   Diagnosis Date Noted   History of pulmonary embolism 10/10/2021   HTN (hypertension) 10/10/2021   Chronic diastolic CHF (congestive heart failure) ([place]) 10/10/2021   GERD (gastroesophageal reflux disease)  10/10/2021   Ankylosing spondylitis ([place]) 10/10/2021   CKD (chronic kidney disease), stage III ([place]) 10/10/2021   Hyperlipidemia 10/10/2021   CAD (coronary artery disease) 10/10/2021   Periprosthetic fracture of shaft of femur 10/10/2021   Other fatigue 06/05/2021   CAD (coronary artery disease) 23/30/0762   Diastolic CHF ([place]) 26/33/3545   SOB (shortness of breath) on exertion 03/26/2021   Chronic anticoagulation 03/26/2021   Anemia in chronic kidney disease 01/25/2021   Acquired coagulation disorder ([place]) 12/12/2020   Senile purpura ([place]) 12/12/2020   S/P lumbar fusion 08/03/2020   Thin skin 07/25/2020   Aortic atherosclerosis ([place]) 05/17/2020   B12 deficiency  05/17/2020   Iliac artery aneurysm ([place]) 05/17/2020   Osteoporosis 04/04/2020   Idiopathic neuropathy 01/28/2020   Recurrent falls 01/28/2020   Chronic kidney disease (CKD), stage III (moderate) ([place]) 01/19/2020   Pulmonary emboli ([place]) 01/14/2020   GERD (gastroesophageal reflux disease) 12/22/2019   Essential tremor 12/22/2019   Femoral condyle fracture ([place]) 07/15/2019   Gross hematuria 06/14/2019   Hyperlipidemia 11/11/2017   Kidney stone 11/05/2017   Community acquired pneumonia of right upper lobe of lung 11/05/2017   Right ureteral stone 11/05/2017   History of total right hip replacement 03/08/2017   History of prostate cancer 03/08/2017   Personal history of prostate cancer 02/28/2017   High risk medication use 02/28/2017   DDD (degenerative disc disease), thoracic 02/28/2017   Ataxia 05/18/2016   Vestibular disequilibrium 05/18/2016   Ankylosing spondylitis of cervical region ([place]) 05/24/2014   Lung nodule, solitary 05/24/2014   Lumbosacral stenosis 06/05/2013   Peripheral edema 06/08/2012   Subcortical microvascular ischemic occlusive disease 08/13/2010   Extrinsic asthma 04/07/2009   Backache 04/21/2008   History of peptic ulcer disease 04/21/2008   Essential hypertension 10/29/2007  REFERRING PROVIDER: [name], MD   REFERRING DIAG:  M54.50,G89.29 (ICD-10-CM) - Chronic bilateral low back pain without sciatica  R26.89 (ICD-10-CM) - Other abnormalities of gait and mobility      THERAPY DIAG:  Pain in right hip   Pain in right leg   Difficulty in walking, not elsewhere classified   Muscle weakness (generalized)   ONSET DATE: October 10, 2021 recent fall causing fracture   SUBJECTIVE:                                                                                                                                                                                            SUBJECTIVE STATEMENT: I was shocked I could do a single leg squat in the pool.  Getting stronger with the aqua & land combo.    PERTINENT HISTORY:  Stenosis,  Rt THA 77 yr ago with h/o periprosthetic fx & revision in 2022, h/o back surgery  Hard of hearing; can hear out of Left ear   PAIN:  Are you having pain? Yes NPRS scale: 6-8/10 Pain location: Rt hip, Lt hip, low back  Description:  Aggravating factors: Rt WB Relieving factors: rest   PRECAUTIONS: Posterior hip   WEIGHT BEARING RESTRICTIONS No   FALLS:  Has patient fallen in last 6 months? Yes, Number of falls: 1 4/21: no falls recently 6/9: denies new falls 7/7: denies falls     PATIENT GOALS build up strength, get rid of assistance for toileting and getting in/out of bed, no AD in gait, stairs, Son's wedding in [place] first of June     OBJECTIVE: *All objective findings taken at [facility] unless otherwise noted.    PATIENT SURVEYS:  LEFS 39 4/21: 46 5/19: 43 6/9: 43 7/7: 43  0 on walking 2 blocks, 1 mile & stiars- would like to see these improve in particular     COGNITION:          Overall cognitive status: Within functional limits for tasks assessed                        SENSATION:          Bil hand/feet neuropathy unchanged since evaluation       LE MMT:  6/9 measured with handheld dynamometry Normative values: hip flexion 38 lb, knee extension 79 lb   MMT Right 03/06/2022 Right/Left 04/19/22 Rt/Lt 05/17/22 Rt/Lt 06/07/22 Rt//Lt 07/05/22 Rt//Lt 20-Aug-2022  Hip flexion 4/5 11.1/ 24.3 20.9/42.6 34.8/44.2 38.9//42.1   Knee flexion 3/5 32.3/37.9 37.7/55.3 38.7/48.6 42.2//54.9   Knee extension 3/5 12.1/42.4 20.7/60.7 25.8/50.5 24.9//50.6 28.2//66.2   (Blank rows = not tested)  6/23:unable to lift RtLE against gravity in sidelying       FUNCTIONAL TESTS:  5 times sit to stand: 38s 4/21: 18s without use of UEs 6/9:16s without use of UEs Berg Balance Scale: 33/56 4/21: 40/50 6/9: 44/50   GAIT:   Comments: SPC in Lt hand, rt leg abducted and externally rotated, antalgic on Rt with  pressure placed through cane 4/21: SPC in Lt hand, Rt leg no longer abd /ER; does demo Rt trunk SB in Rt stance phase 6/9: SPC in Lt hand, mild Rt trunk SB in Rt stance phase, maintains upright posture 7/7: with SPC: maint upright posture wihtout Rt sidebend in stance phase; without SPC- cont upright posture and good swing through but does present sidebend to the Rt in Rt stance phase        TODAY'S TREATMENT   2023/08/21: Standing half dead lift- central weight distributionn Shoulder flexion 5lb kettle bell with center weight distribution Sidelying assisted Clams & hip abd- 3s holds with slow lower Hooklying march- progressed to adding ext to kick PT hand at 45 deg of hip flexion Assisted SLR Seated LAQ 3s hold and slow lower     PATIENT EDUCATION:  Education details: Anatomy of condition, POC, HEP, exercise form/rationale  Person educated: Patient and Spouse Education method: Explanation, Demonstration, Tactile cues, and Verbal cues Education comprehension: verbalized understanding, returned demonstration, verbal cues required, tactile cues required, and needs further education     HOME EXERCISE PROGRAM: [id number]   ASSESSMENT:   CLINICAL IMPRESSION: Now requiring ModA for clams rather than Max as before. Favoring Lt leg upright but able to correct for max of 5 reps of dead lifts with verbal  cues.     OBJECTIVE IMPAIRMENTS Abnormal gait, decreased activity tolerance, decreased balance, difficulty walking, decreased strength, impaired sensation, improper body mechanics, postural dysfunction, and pain.    ACTIVITY LIMITATIONS cleaning, community activity, driving, meal prep, laundry, and shopping.    PERSONAL FACTORS  see problem list  are also affecting patient's functional outcome.      REHAB POTENTIAL: Good   CLINICAL DECISION MAKING: Unstable/unpredictable   EVALUATION COMPLEXITY: High     GOALS: Goals reviewed with patient? Yes   SHORT TERM GOALS:   Able to  enter/exit pool independently with use of hand rails Baseline: will begin training Target date: 04/03/2022 Goal status: Achieved    2.  Good tolerance to exercise without limitations in next day due to pain Baseline: will establish proper working level Target date: 04/03/2022 Goal status: Ongoing    3.  Able to shift weight to perform step taps with UE assist Baseline: unable at eval Target date: 04/03/2022 Goal status: Achieved    4.  Able to ambulate with centered weight distribution Baseline: at eval- leg abducted and trunk lean Target date: 04/03/2022 Goal status: Ongoing       LONG TERM GOALS:   BERG to improve by MDC of 6 points Baseline: 33 at eval Target date:  07/06/22 Goal status: achieved   2.  Pt will ambulate safely around his home wihtout use of AD Baseline: achieved Goal status: ongoing   3.  Able to navigate stairs in home safely and confidently Baseline:navigating stairs with "up with the good, down with the bad" pattern  Goal status: ongoing   4.  LEFS to improve by MDC Baseline: MDC is 9 points  Goal status: INITIAL   5.  Pt will demo 5TSTS in 20s or less Baseline: 38s at eval  Goal status: Achieved - 04/19/22   6.  Pt will be able to rid home of aid devices Baseline: taken toilet seats out, uses stool in shower but for convenience, not need; still using walker to carry things  Goal status: partially met  7. Start walking around the neighborhood, ultimately without AD  Baseline: walking but with cane.     Goal status: ongoing   PLAN: PT FREQUENCY: 1-2x/week   PT DURATION: other: 4 months   PLANNED INTERVENTIONS: Therapeutic exercises, Therapeutic activity, Neuromuscular re-education, Balance training, Gait training, Patient/Family education, Joint mobilization, Stair training, Aquatic Therapy, Electrical stimulation, Cryotherapy, Moist heat, Taping, and Manual therapy   PLAN FOR NEXT SESSION: Rt quad activation, continue CKC exercises focusing  on midline weight distribution/shifting Rt  Kita Neace C. Carr Shartzer PT, DPT 07/26/22 11:51 AM

## 2022-07-31 ENCOUNTER — Ambulatory Visit (HOSPITAL_BASED_OUTPATIENT_CLINIC_OR_DEPARTMENT_OTHER): Payer: Medicare Other | Attending: Family Medicine | Admitting: Physical Therapy

## 2022-07-31 ENCOUNTER — Encounter (HOSPITAL_BASED_OUTPATIENT_CLINIC_OR_DEPARTMENT_OTHER): Payer: Self-pay | Admitting: Physical Therapy

## 2022-07-31 DIAGNOSIS — M6281 Muscle weakness (generalized): Secondary | ICD-10-CM | POA: Diagnosis not present

## 2022-07-31 DIAGNOSIS — M25551 Pain in right hip: Secondary | ICD-10-CM | POA: Diagnosis not present

## 2022-07-31 DIAGNOSIS — R262 Difficulty in walking, not elsewhere classified: Secondary | ICD-10-CM | POA: Diagnosis not present

## 2022-07-31 DIAGNOSIS — M79604 Pain in right leg: Secondary | ICD-10-CM | POA: Diagnosis not present

## 2022-07-31 NOTE — Therapy (Signed)
OUTPATIENT PHYSICAL THERAPY TREATMENT NOTE     Patient Name: Bruce Ball MRN: [id number] DOB:1946-03-19, 76 y.o., male Today's Date: 07/31/2022  PCP: [name], MD REFERRING PROVIDER: [name], MD   PT End of Session - 07/31/22 1211     Visit Number 34    Number of Visits 59    Date for PT Re-Evaluation 08/09/22    Authorization Type MCR    Progress Note Due on Visit 74    PT Start Time 1204    PT Stop Time 1245    PT Time Calculation (min) 41 min    Activity Tolerance Patient tolerated treatment well    Behavior During Therapy WFL for tasks assessed/performed                 Past Medical History:  Diagnosis Date   Ankylosing spondylitis ([place])    Arthritis of right knee    Basal cell carcinoma    Chronic back pain greater than 3 months duration    Chronic leg pain    CKD (chronic kidney disease), stage II    Coronary artery calcification seen on CAT scan 03/27/2021   Deafness in right ear    Dyspnea    cause by PE   Essential tremor    Extrinsic asthma, unspecified 04/07/2009   PT DENIES   GERD (gastroesophageal reflux disease)    Hearing loss    Hip problem    History of kidney stones    HYPERTENSION 10/29/2007   HYPOTENSION 08/13/2010   LOW BACK PAIN 04/21/2008   Mild aortic stenosis    NEPHROLITHIASIS, HX OF 04/21/2008   Neuropathy    Osteopenia    Other hyperlipidemia    Peptic ulcer    Pneumonia    PUD, HX OF 04/21/2008   Pulmonary emboli ([place]) 2021   noted on CT 12-2019   Renal disorder    Stage 3 chronic kidney disease (HCC)    Thyroid nodule    Past Surgical History:  Procedure Laterality Date   BACK SURGERY     bone cancer  1970   rt femur removed and steel rod placed   CYSTOSCOPY WITH RETROGRADE PYELOGRAM, URETEROSCOPY AND STENT PLACEMENT Right 11/06/2017   Procedure: CYSTOSCOPY WITH  STENT PLACEMENT RIGHT;  Surgeon: Raynelle Bring, MD;  [facility];  Service: Urology;  Laterality: Right;   CYSTOSCOPY WITH  RETROGRADE PYELOGRAM, URETEROSCOPY AND STENT PLACEMENT Left 06/09/2019   Procedure: CYSTOSCOPY WITH RETROGRADE PYELOGRAM, URETEROSCOPY AND STENT PLACEMENT X2;  Surgeon: [name], MD;  [facility];  Service: Urology;  Laterality: Left;   CYSTOSCOPY WITH RETROGRADE PYELOGRAM, URETEROSCOPY AND STENT PLACEMENT Bilateral 03/01/2020   Procedure: CYSTOSCOPY WITH RETROGRADE PYELOGRAM, URETEROSCOPY AND STENT PLACEMENT;  Surgeon: [name], MD;  [facility];  Service: Urology;  Laterality: Bilateral;  75 MINS   CYSTOSCOPY/RETROGRADE/URETEROSCOPY     1 year ago at [place] in [place] Right 11/24/2017   Procedure: CYSTOSCOPY/URETEROSCOPY/ RETROGRADE/STENT REMOVAL;  Surgeon: Raynelle Bring, MD;  [facility];  Service: Urology;  Laterality: Right;   HOLMIUM LASER APPLICATION Bilateral 06/05/2093   Procedure: HOLMIUM LASER APPLICATION;  Surgeon: [name], MD;  [facility];  Service: Urology;  Laterality: Bilateral;   prosatectomy     Patient Active Problem List   Diagnosis Date Noted   History of pulmonary embolism 10/10/2021   HTN (hypertension) 10/10/2021   Chronic diastolic CHF (congestive heart failure) ([place]) 10/10/2021   GERD (gastroesophageal reflux disease)  10/10/2021   Ankylosing spondylitis ([place]) 10/10/2021   CKD (chronic kidney disease), stage III ([place]) 10/10/2021   Hyperlipidemia 10/10/2021   CAD (coronary artery disease) 10/10/2021   Periprosthetic fracture of shaft of femur 10/10/2021   Other fatigue 06/05/2021   CAD (coronary artery disease) 16/09/9603   Diastolic CHF ([place]) 54/08/8118   SOB (shortness of breath) on exertion 03/26/2021   Chronic anticoagulation 03/26/2021   Anemia in chronic kidney disease 01/25/2021   Acquired coagulation disorder ([place]) 12/12/2020   Senile purpura ([place]) 12/12/2020   S/P lumbar fusion 08/03/2020   Thin skin 07/25/2020   Aortic atherosclerosis ([place]) 05/17/2020   B12 deficiency  05/17/2020   Iliac artery aneurysm ([place]) 05/17/2020   Osteoporosis 04/04/2020   Idiopathic neuropathy 01/28/2020   Recurrent falls 01/28/2020   Chronic kidney disease (CKD), stage III (moderate) ([place]) 01/19/2020   Pulmonary emboli ([place]) 01/14/2020   GERD (gastroesophageal reflux disease) 12/22/2019   Essential tremor 12/22/2019   Femoral condyle fracture ([place]) 07/15/2019   Gross hematuria 06/14/2019   Hyperlipidemia 11/11/2017   Kidney stone 11/05/2017   Community acquired pneumonia of right upper lobe of lung 11/05/2017   Right ureteral stone 11/05/2017   History of total right hip replacement 03/08/2017   History of prostate cancer 03/08/2017   Personal history of prostate cancer 02/28/2017   High risk medication use 02/28/2017   DDD (degenerative disc disease), thoracic 02/28/2017   Ataxia 05/18/2016   Vestibular disequilibrium 05/18/2016   Ankylosing spondylitis of cervical region ([place]) 05/24/2014   Lung nodule, solitary 05/24/2014   Lumbosacral stenosis 06/05/2013   Peripheral edema 06/08/2012   Subcortical microvascular ischemic occlusive disease 08/13/2010   Extrinsic asthma 04/07/2009   Backache 04/21/2008   History of peptic ulcer disease 04/21/2008   Essential hypertension 10/29/2007  REFERRING PROVIDER: [name], MD   REFERRING DIAG:  M54.50,G89.29 (ICD-10-CM) - Chronic bilateral low back pain without sciatica  R26.89 (ICD-10-CM) - Other abnormalities of gait and mobility      THERAPY DIAG:  Pain in right hip   Pain in right leg   Difficulty in walking, not elsewhere classified   Muscle weakness (generalized)   ONSET DATE: October 10, 2021 recent fall causing fracture   SUBJECTIVE:                                                                                                                                                                                            SUBJECTIVE STATEMENT: Right hip bursa really bad today.   PERTINENT HISTORY:   Stenosis, Rt THA 29 yr ago with h/o periprosthetic fx & revision in 2022, h/o back surgery  Hard of hearing; can hear out of Left ear   PAIN:  Are you having pain: Yes 9/10 Pain location: Rt hip, Lt hip, low back  Description: ache/sharp Aggravating factors: Rt WB Relieving factors: rest   PRECAUTIONS: Posterior hip   WEIGHT BEARING RESTRICTIONS No   FALLS:  Has patient fallen in last 6 months? Yes, Number of falls: 1 4/21: no falls recently 6/9: denies new falls 7/7: denies falls     PATIENT GOALS build up strength, get rid of assistance for toileting and getting in/out of bed, no AD in gait, stairs, Son's wedding in [place] first of June     OBJECTIVE: *All objective findings taken at [facility] unless otherwise noted.    PATIENT SURVEYS:  LEFS 39 4/21: 46 5/19: 43 6/9: 43 7/7: 43  0 on walking 2 blocks, 1 mile & stiars- would like to see these improve in particular     COGNITION:          Overall cognitive status: Within functional limits for tasks assessed                        SENSATION:          Bil hand/feet neuropathy unchanged since evaluation       LE MMT:  6/9 measured with handheld dynamometry Normative values: hip flexion 38 lb, knee extension 79 lb   MMT Right 03/06/2022 Right/Left 04/19/22 Rt/Lt 05/17/22 Rt/Lt 06/07/22 Rt//Lt 07/05/22 Rt//Lt 07/26/22  Hip flexion 4/5 11.1/ 24.3 20.9/42.6 34.8/44.2 38.9//42.1   Knee flexion 3/5 32.3/37.9 37.7/55.3 38.7/48.6 42.2//54.9   Knee extension 3/5 12.1/42.4 20.7/60.7 25.8/50.5 24.9//50.6 28.2//66.2   (Blank rows = not tested)  6/23:unable to lift RtLE against gravity in sidelying       FUNCTIONAL TESTS:  5 times sit to stand: 38s 4/21: 18s without use of UEs 6/9:16s without use of UEs Berg Balance Scale: 33/56 4/21: 40/50 6/9: 44/50   GAIT:   Comments: SPC in Lt hand, rt leg abducted and externally rotated, antalgic on Rt with pressure placed through cane 4/21: SPC in Lt hand, Rt leg no longer abd  /ER; does demo Rt trunk SB in Rt stance phase 6/9: SPC in Lt hand, mild Rt trunk SB in Rt stance phase, maintains upright posture 7/7: with SPC: maint upright posture wihtout Rt sidebend in stance phase; without SPC- cont upright posture and good swing through but does present sidebend to the Rt in Rt stance phase        TODAY'S TREATMENT  8-2/23             Pt seen for aquatic therapy today.  Treatment took place in water 3.25-4 ft 8 in depth at the [facility] pool. Temp of water was 92.  Pt entered/exited the pool via stairs independently with bilat rail, step-to pattern.   Without UE support:  Forward/ backward walking in 90f, 8" water Side stepping with shoulder abdct/ add 1 foam hand buoy Braiding Seated 4th step: cycling 3x30 revolutions; add/abd 3x20; flutter 3x20 STS 3rd step x10 Tandem gait forward/ backward 4 ft Squats at wall, R SL squat x 8.  Tolerated only fiar todfay       PATIENT EDUCATION:  Education details: Anatomy of condition, POC, HEP, exercise form/rationale  Person educated: Patient and Spouse Education method: Explanation, Demonstration, Tactile cues, and Verbal cues Education comprehension: verbalized understanding, returned demonstration, verbal cues required, tactile cues required, and needs further education  HOME EXERCISE PROGRAM: [id number]   ASSESSMENT:   CLINICAL IMPRESSION: Increased pain right hip (bursa).  Modified session adding seated/non loading exercises as able.  Tolerates fairly well until end of session.  Exits from pool with 2 sharp pains limiting amb ability momentarily. He is able to use cane to exit facility.    OBJECTIVE IMPAIRMENTS Abnormal gait, decreased activity tolerance, decreased balance, difficulty walking, decreased strength, impaired sensation, improper body mechanics, postural dysfunction, and pain.    ACTIVITY LIMITATIONS cleaning, community activity, driving, meal prep, laundry, and shopping.     PERSONAL FACTORS  see problem list  are also affecting patient's functional outcome.      REHAB POTENTIAL: Good   CLINICAL DECISION MAKING: Unstable/unpredictable   EVALUATION COMPLEXITY: High     GOALS: Goals reviewed with patient? Yes   SHORT TERM GOALS:   Able to enter/exit pool independently with use of hand rails Baseline: will begin training Target date: 04/03/2022 Goal status: Achieved    2.  Good tolerance to exercise without limitations in next day due to pain Baseline: will establish proper working level Target date: 04/03/2022 Goal status: Ongoing    3.  Able to shift weight to perform step taps with UE assist Baseline: unable at eval Target date: 04/03/2022 Goal status: Achieved    4.  Able to ambulate with centered weight distribution Baseline: at eval- leg abducted and trunk lean Target date: 04/03/2022 Goal status: Ongoing       LONG TERM GOALS:   BERG to improve by MDC of 6 points Baseline: 33 at eval Target date:  07/06/22 Goal status: achieved   2.  Pt will ambulate safely around his home wihtout use of AD Baseline: achieved Goal status: ongoing   3.  Able to navigate stairs in home safely and confidently Baseline:navigating stairs with "up with the good, down with the bad" pattern  Goal status: ongoing   4.  LEFS to improve by MDC Baseline: MDC is 9 points  Goal status: INITIAL   5.  Pt will demo 5TSTS in 20s or less Baseline: 38s at eval  Goal status: Achieved - 04/19/22   6.  Pt will be able to rid home of aid devices Baseline: taken toilet seats out, uses stool in shower but for convenience, not need; still using walker to carry things  Goal status: partially met  7. Start walking around the neighborhood, ultimately without AD  Baseline: walking but with cane.     Goal status: ongoing   PLAN: PT FREQUENCY: 1-2x/week   PT DURATION: other: 4 months   PLANNED INTERVENTIONS: Therapeutic exercises, Therapeutic activity,  Neuromuscular re-education, Balance training, Gait training, Patient/Family education, Joint mobilization, Stair training, Aquatic Therapy, Electrical stimulation, Cryotherapy, Moist heat, Taping, and Manual therapy   PLAN FOR NEXT SESSION: Rt quad activation, continue CKC exercises focusing on midline weight distribution/shifting Rt  Jameis Newsham (Frankie) Ardine Iacovelli MPT 07/31/22 12:30 PM

## 2022-08-02 ENCOUNTER — Ambulatory Visit (HOSPITAL_BASED_OUTPATIENT_CLINIC_OR_DEPARTMENT_OTHER): Payer: Medicare Other | Admitting: Physical Therapy

## 2022-08-02 ENCOUNTER — Encounter (HOSPITAL_BASED_OUTPATIENT_CLINIC_OR_DEPARTMENT_OTHER): Payer: Self-pay | Admitting: Physical Therapy

## 2022-08-02 DIAGNOSIS — R262 Difficulty in walking, not elsewhere classified: Secondary | ICD-10-CM

## 2022-08-02 DIAGNOSIS — M6281 Muscle weakness (generalized): Secondary | ICD-10-CM | POA: Diagnosis not present

## 2022-08-02 DIAGNOSIS — M79604 Pain in right leg: Secondary | ICD-10-CM

## 2022-08-02 DIAGNOSIS — M25551 Pain in right hip: Secondary | ICD-10-CM | POA: Diagnosis not present

## 2022-08-02 NOTE — Therapy (Signed)
OUTPATIENT PHYSICAL THERAPY TREATMENT NOTE     Patient Name: Bruce Ball MRN: [id number] DOB:02-17-1946, 76 y.o., male Today's Date: 08/02/2022  PCP: [name], MD REFERRING PROVIDER: [name], MD   PT End of Session - 08/02/22 1019     Visit Number 35    Number of Visits 62    Date for PT Re-Evaluation 08/09/22    Authorization Type MCR    Progress Note Due on Visit 45    PT Start Time 1015    PT Stop Time 1100    PT Time Calculation (min) 45 min    Activity Tolerance Patient tolerated treatment well    Behavior During Therapy WFL for tasks assessed/performed                 Past Medical History:  Diagnosis Date   Ankylosing spondylitis ([place])    Arthritis of right knee    Basal cell carcinoma    Chronic back pain greater than 3 months duration    Chronic leg pain    CKD (chronic kidney disease), stage II    Coronary artery calcification seen on CAT scan 03/27/2021   Deafness in right ear    Dyspnea    cause by PE   Essential tremor    Extrinsic asthma, unspecified 04/07/2009   PT DENIES   GERD (gastroesophageal reflux disease)    Hearing loss    Hip problem    History of kidney stones    HYPERTENSION 10/29/2007   HYPOTENSION 08/13/2010   LOW BACK PAIN 04/21/2008   Mild aortic stenosis    NEPHROLITHIASIS, HX OF 04/21/2008   Neuropathy    Osteopenia    Other hyperlipidemia    Peptic ulcer    Pneumonia    PUD, HX OF 04/21/2008   Pulmonary emboli ([place]) 2021   noted on CT 12-2019   Renal disorder    Stage 3 chronic kidney disease (HCC)    Thyroid nodule    Past Surgical History:  Procedure Laterality Date   BACK SURGERY     bone cancer  1970   rt femur removed and steel rod placed   CYSTOSCOPY WITH RETROGRADE PYELOGRAM, URETEROSCOPY AND STENT PLACEMENT Right 11/06/2017   Procedure: CYSTOSCOPY WITH  STENT PLACEMENT RIGHT;  Surgeon: Raynelle Bring, MD;  Location: [facility];  Service: Urology;  Laterality: Right;   CYSTOSCOPY WITH  RETROGRADE PYELOGRAM, URETEROSCOPY AND STENT PLACEMENT Left 06/09/2019   Procedure: CYSTOSCOPY WITH RETROGRADE PYELOGRAM, URETEROSCOPY AND STENT PLACEMENT X2;  Surgeon: [name], MD;  Location: [facility];  Service: Urology;  Laterality: Left;   CYSTOSCOPY WITH RETROGRADE PYELOGRAM, URETEROSCOPY AND STENT PLACEMENT Bilateral 03/01/2020   Procedure: CYSTOSCOPY WITH RETROGRADE PYELOGRAM, URETEROSCOPY AND STENT PLACEMENT;  Surgeon: [name], MD;  Location: [facility];  Service: Urology;  Laterality: Bilateral;  75 MINS   CYSTOSCOPY/RETROGRADE/URETEROSCOPY     1 year ago at [place] in [place] Right 11/24/2017   Procedure: CYSTOSCOPY/URETEROSCOPY/ RETROGRADE/STENT REMOVAL;  Surgeon: Raynelle Bring, MD;  Location: [facility];  Service: Urology;  Laterality: Right;   HOLMIUM LASER APPLICATION Bilateral 07/04/5464   Procedure: HOLMIUM LASER APPLICATION;  Surgeon: [name], MD;  Location: [facility];  Service: Urology;  Laterality: Bilateral;   prosatectomy     Patient Active Problem List   Diagnosis Date Noted   History of pulmonary embolism 10/10/2021   HTN (hypertension) 10/10/2021   Chronic diastolic CHF (congestive heart failure) ([place]) 10/10/2021   GERD (gastroesophageal reflux disease)  10/10/2021   Ankylosing spondylitis ([place]) 10/10/2021   CKD (chronic kidney disease), stage III ([place]) 10/10/2021   Hyperlipidemia 10/10/2021   CAD (coronary artery disease) 10/10/2021   Periprosthetic fracture of shaft of femur 10/10/2021   Other fatigue 06/05/2021   CAD (coronary artery disease) 35/46/5681   Diastolic CHF ([place]) 27/51/7001   SOB (shortness of breath) on exertion 03/26/2021   Chronic anticoagulation 03/26/2021   Anemia in chronic kidney disease 01/25/2021   Acquired coagulation disorder ([place]) 12/12/2020   Senile purpura ([place]) 12/12/2020   S/P lumbar fusion 08/03/2020   Thin skin 07/25/2020   Aortic atherosclerosis ([place]) 05/17/2020   B12 deficiency  05/17/2020   Iliac artery aneurysm ([place]) 05/17/2020   Osteoporosis 04/04/2020   Idiopathic neuropathy 01/28/2020   Recurrent falls 01/28/2020   Chronic kidney disease (CKD), stage III (moderate) ([place]) 01/19/2020   Pulmonary emboli ([place]) 01/14/2020   GERD (gastroesophageal reflux disease) 12/22/2019   Essential tremor 12/22/2019   Femoral condyle fracture ([place]) 07/15/2019   Gross hematuria 06/14/2019   Hyperlipidemia 11/11/2017   Kidney stone 11/05/2017   Community acquired pneumonia of right upper lobe of lung 11/05/2017   Right ureteral stone 11/05/2017   History of total right hip replacement 03/08/2017   History of prostate cancer 03/08/2017   Personal history of prostate cancer 02/28/2017   High risk medication use 02/28/2017   DDD (degenerative disc disease), thoracic 02/28/2017   Ataxia 05/18/2016   Vestibular disequilibrium 05/18/2016   Ankylosing spondylitis of cervical region ([place]) 05/24/2014   Lung nodule, solitary 05/24/2014   Lumbosacral stenosis 06/05/2013   Peripheral edema 06/08/2012   Subcortical microvascular ischemic occlusive disease 08/13/2010   Extrinsic asthma 04/07/2009   Backache 04/21/2008   History of peptic ulcer disease 04/21/2008   Essential hypertension 10/29/2007  REFERRING PROVIDER: [name], MD   REFERRING DIAG:  M54.50,G89.29 (ICD-10-CM) - Chronic bilateral low back pain without sciatica  R26.89 (ICD-10-CM) - Other abnormalities of gait and mobility      THERAPY DIAG:  Pain in right hip   Pain in right leg   Difficulty in walking, not elsewhere classified   Muscle weakness (generalized)   ONSET DATE: October 10, 2021 recent fall causing fracture   SUBJECTIVE:                                                                                                                                                                                            SUBJECTIVE STATEMENT: Terrible- rubbing lateral hip and groin   PERTINENT  HISTORY:  Stenosis, Rt THA 52 yr ago with h/o periprosthetic fx & revision in 2022, h/o back surgery  Hard of hearing; can hear out of Left ear   PAIN:  Are you having pain: Yes 9/10 Pain location: Rt hip, Lt hip, low back  Description: ache/sharp Aggravating factors: Rt WB Relieving factors: rest   PRECAUTIONS: Posterior hip   WEIGHT BEARING RESTRICTIONS No   FALLS:  Has patient fallen in last 6 months? Yes, Number of falls: 1 4/21: no falls recently 6/9: denies new falls 7/7: denies falls     PATIENT GOALS build up strength, get rid of assistance for toileting and getting in/out of bed, no AD in gait, stairs, Son's wedding in [place] first of June     OBJECTIVE: *All objective findings taken at [facility] unless otherwise noted.    PATIENT SURVEYS:  LEFS 39 4/21: 46 5/19: 43 6/9: 43 7/7: 43  0 on walking 2 blocks, 1 mile & stiars- would like to see these improve in particular     COGNITION:          Overall cognitive status: Within functional limits for tasks assessed                        SENSATION:          Bil hand/feet neuropathy unchanged since evaluation       LE MMT:  6/9 measured with handheld dynamometry Normative values: hip flexion 38 lb, knee extension 79 lb   MMT Right 03/06/2022 Right/Left 04/19/22 Rt/Lt 05/17/22 Rt/Lt 06/07/22 Rt//Lt 07/05/22 Rt//Lt 07/26/22  Hip flexion 4/5 11.1/ 24.3 20.9/42.6 34.8/44.2 38.9//42.1   Knee flexion 3/5 32.3/37.9 37.7/55.3 38.7/48.6 42.2//54.9   Knee extension 3/5 12.1/42.4 20.7/60.7 25.8/50.5 24.9//50.6 28.2//66.2   (Blank rows = not tested)  6/23:unable to lift RtLE against gravity in sidelying       FUNCTIONAL TESTS:  5 times sit to stand: 38s 4/21: 18s without use of UEs 6/9:16s without use of UEs Berg Balance Scale: 33/56 4/21: 40/50 6/9: 44/50   GAIT:   Comments: SPC in Lt hand, rt leg abducted and externally rotated, antalgic on Rt with pressure placed through cane 4/21: SPC in Lt hand, Rt leg no  longer abd /ER; does demo Rt trunk SB in Rt stance phase 6/9: SPC in Lt hand, mild Rt trunk SB in Rt stance phase, maintains upright posture 7/7: with SPC: maint upright posture wihtout Rt sidebend in stance phase; without SPC- cont upright posture and good swing through but does present sidebend to the Rt in Rt stance phase        TODAY'S TREATMENT  08/02/22: Nu step 5 min L6 Ue & LE MANUAL: roller to glut med/min/TFL, quads Stnaind hip abd with slgiht flexion at bar Seated trunk flexion with physioball Seated LAQ ball bw knees Seated clam- yellow tband- focus on coordinated movement  Combo of abd yellow tband + LAQ Sidelying clams- PT blocked roll back but did not assist in hip ER Sidelying hip abd- PT assisted 4x5      PATIENT EDUCATION:  Education details: Anatomy of condition, POC, HEP, exercise form/rationale  Person educated: Patient and Spouse Education method: Explanation, Demonstration, Tactile cues, and Verbal cues Education comprehension: verbalized understanding, returned demonstration, verbal cues required, tactile cues required, and needs further education     HOME EXERCISE PROGRAM: [id number]   ASSESSMENT:   CLINICAL IMPRESSION: Able to demo active strength against gravity independently in clam today. Concordant pain reduced with trigger point release in hip abd musculature. Pt states the prosthesis is more squared at the point  of the bursa which makes it more painful and it should get better over time.    OBJECTIVE IMPAIRMENTS Abnormal gait, decreased activity tolerance, decreased balance, difficulty walking, decreased strength, impaired sensation, improper body mechanics, postural dysfunction, and pain.    ACTIVITY LIMITATIONS cleaning, community activity, driving, meal prep, laundry, and shopping.    PERSONAL FACTORS  see problem list  are also affecting patient's functional outcome.      REHAB POTENTIAL: Good   CLINICAL DECISION MAKING:  Unstable/unpredictable   EVALUATION COMPLEXITY: High     GOALS: Goals reviewed with patient? Yes   SHORT TERM GOALS:   Able to enter/exit pool independently with use of hand rails Baseline: will begin training Target date: 04/03/2022 Goal status: Achieved    2.  Good tolerance to exercise without limitations in next day due to pain Baseline: will establish proper working level Target date: 04/03/2022 Goal status: Ongoing    3.  Able to shift weight to perform step taps with UE assist Baseline: unable at eval Target date: 04/03/2022 Goal status: Achieved    4.  Able to ambulate with centered weight distribution Baseline: at eval- leg abducted and trunk lean Target date: 04/03/2022 Goal status: Ongoing       LONG TERM GOALS:   BERG to improve by MDC of 6 points Baseline: 33 at eval Target date:  07/06/22 Goal status: achieved   2.  Pt will ambulate safely around his home wihtout use of AD Baseline: achieved Goal status: ongoing   3.  Able to navigate stairs in home safely and confidently Baseline:navigating stairs with "up with the good, down with the bad" pattern  Goal status: ongoing   4.  LEFS to improve by MDC Baseline: MDC is 9 points  Goal status: INITIAL   5.  Pt will demo 5TSTS in 20s or less Baseline: 38s at eval  Goal status: Achieved - 04/19/22   6.  Pt will be able to rid home of aid devices Baseline: taken toilet seats out, uses stool in shower but for convenience, not need; still using walker to carry things  Goal status: partially met  7. Start walking around the neighborhood, ultimately without AD  Baseline: walking but with cane.     Goal status: ongoing   PLAN: PT FREQUENCY: 1-2x/week   PT DURATION: other: 4 months   PLANNED INTERVENTIONS: Therapeutic exercises, Therapeutic activity, Neuromuscular re-education, Balance training, Gait training, Patient/Family education, Joint mobilization, Stair training, Aquatic Therapy, Electrical  stimulation, Cryotherapy, Moist heat, Taping, and Manual therapy   PLAN FOR NEXT SESSION: Rt quad activation, continue CKC exercises focusing on midline weight distribution/shifting Rt  [place]let Lasecki C. Aaden Buckman PT, DPT 08/02/22 8:20 PM

## 2022-08-04 NOTE — Therapy (Signed)
OUTPATIENT PHYSICAL THERAPY TREATMENT NOTE     Patient Name: Bruce Ball MRN: [id number] DOB:23-Dec-1946, 76 y.o., male Today's Date: 08/05/2022  PCP: [name], MD REFERRING PROVIDER: [name], MD   PT End of Session - 08/05/22 1058     Visit Number 36    Number of Visits 57    Date for PT Re-Evaluation 09/16/22    Authorization Type MCR    Progress Note Due on Visit 38    PT Start Time 1057    PT Stop Time 1127    PT Time Calculation (min) 30 min    Activity Tolerance Patient limited by pain    Behavior During Therapy [facility] for tasks assessed/performed                  Past Medical History:  Diagnosis Date   Ankylosing spondylitis ([place])    Arthritis of right knee    Basal cell carcinoma    Chronic back pain greater than 3 months duration    Chronic leg pain    CKD (chronic kidney disease), stage II    Coronary artery calcification seen on CAT scan 03/27/2021   Deafness in right ear    Dyspnea    cause by PE   Essential tremor    Extrinsic asthma, unspecified 04/07/2009   PT DENIES   GERD (gastroesophageal reflux disease)    Hearing loss    Hip problem    History of kidney stones    HYPERTENSION 10/29/2007   HYPOTENSION 08/13/2010   LOW BACK PAIN 04/21/2008   Mild aortic stenosis    NEPHROLITHIASIS, HX OF 04/21/2008   Neuropathy    Osteopenia    Other hyperlipidemia    Peptic ulcer    Pneumonia    PUD, HX OF 04/21/2008   Pulmonary emboli ([place]) 2021   noted on CT 12-2019   Renal disorder    Stage 3 chronic kidney disease (HCC)    Thyroid nodule    Past Surgical History:  Procedure Laterality Date   BACK SURGERY     bone cancer  1970   rt femur removed and steel rod placed   CYSTOSCOPY WITH RETROGRADE PYELOGRAM, URETEROSCOPY AND STENT PLACEMENT Right 11/06/2017   Procedure: CYSTOSCOPY WITH  STENT PLACEMENT RIGHT;  Surgeon: Raynelle Bring, MD;  Location: [facility];  Service: Urology;  Laterality: Right;   CYSTOSCOPY WITH RETROGRADE  PYELOGRAM, URETEROSCOPY AND STENT PLACEMENT Left 06/09/2019   Procedure: CYSTOSCOPY WITH RETROGRADE PYELOGRAM, URETEROSCOPY AND STENT PLACEMENT X2;  Surgeon: [name], MD;  Location: [facility];  Service: Urology;  Laterality: Left;   CYSTOSCOPY WITH RETROGRADE PYELOGRAM, URETEROSCOPY AND STENT PLACEMENT Bilateral 03/01/2020   Procedure: CYSTOSCOPY WITH RETROGRADE PYELOGRAM, URETEROSCOPY AND STENT PLACEMENT;  Surgeon: [name], MD;  Location: [facility];  Service: Urology;  Laterality: Bilateral;  75 MINS   CYSTOSCOPY/RETROGRADE/URETEROSCOPY     1 year ago at [place] in [place] Right 11/24/2017   Procedure: CYSTOSCOPY/URETEROSCOPY/ RETROGRADE/STENT REMOVAL;  Surgeon: Raynelle Bring, MD;  Location: [facility];  Service: Urology;  Laterality: Right;   HOLMIUM LASER APPLICATION Bilateral 03/04/4655   Procedure: HOLMIUM LASER APPLICATION;  Surgeon: [name], MD;  Location: [facility];  Service: Urology;  Laterality: Bilateral;   prosatectomy     Patient Active Problem List   Diagnosis Date Noted   History of pulmonary embolism 10/10/2021   HTN (hypertension) 10/10/2021   Chronic diastolic CHF (congestive heart failure) ([place]) 10/10/2021   GERD (gastroesophageal reflux  disease) 10/10/2021   Ankylosing spondylitis ([place]) 10/10/2021   CKD (chronic kidney disease), stage III ([place]) 10/10/2021   Hyperlipidemia 10/10/2021   CAD (coronary artery disease) 10/10/2021   Periprosthetic fracture of shaft of femur 10/10/2021   Other fatigue 06/05/2021   CAD (coronary artery disease) 07/62/2633   Diastolic CHF ([place]) 35/45/6256   SOB (shortness of breath) on exertion 03/26/2021   Chronic anticoagulation 03/26/2021   Anemia in chronic kidney disease 01/25/2021   Acquired coagulation disorder ([place]) 12/12/2020   Senile purpura ([place]) 12/12/2020   S/P lumbar fusion 08/03/2020   Thin skin 07/25/2020   Aortic atherosclerosis ([place]) 05/17/2020   B12 deficiency 05/17/2020    Iliac artery aneurysm ([place]) 05/17/2020   Osteoporosis 04/04/2020   Idiopathic neuropathy 01/28/2020   Recurrent falls 01/28/2020   Chronic kidney disease (CKD), stage III (moderate) ([place]) 01/19/2020   Pulmonary emboli ([place]) 01/14/2020   GERD (gastroesophageal reflux disease) 12/22/2019   Essential tremor 12/22/2019   Femoral condyle fracture ([place]) 07/15/2019   Gross hematuria 06/14/2019   Hyperlipidemia 11/11/2017   Kidney stone 11/05/2017   Community acquired pneumonia of right upper lobe of lung 11/05/2017   Right ureteral stone 11/05/2017   History of total right hip replacement 03/08/2017   History of prostate cancer 03/08/2017   Personal history of prostate cancer 02/28/2017   High risk medication use 02/28/2017   DDD (degenerative disc disease), thoracic 02/28/2017   Ataxia 05/18/2016   Vestibular disequilibrium 05/18/2016   Ankylosing spondylitis of cervical region ([place]) 05/24/2014   Lung nodule, solitary 05/24/2014   Lumbosacral stenosis 06/05/2013   Peripheral edema 06/08/2012   Subcortical microvascular ischemic occlusive disease 08/13/2010   Extrinsic asthma 04/07/2009   Backache 04/21/2008   History of peptic ulcer disease 04/21/2008   Essential hypertension 10/29/2007  REFERRING PROVIDER: [name], MD   REFERRING DIAG:  M54.50,G89.29 (ICD-10-CM) - Chronic bilateral low back pain without sciatica  R26.89 (ICD-10-CM) - Other abnormalities of gait and mobility      THERAPY DIAG:  Pain in right hip   Pain in right leg   Difficulty in walking, not elsewhere classified   Muscle weakness (generalized)   ONSET DATE: October 10, 2021 recent fall causing fracture  SUBJECTIVE:                                                                                                                             Progress Note Reporting Period 07/05/22 to 08/05/2022   See note below for Objective Data and Assessment of Progress/Goals.                                                                    SUBJECTIVE STATEMENT: It hurts in the groin today.  PERTINENT HISTORY:  Stenosis, Rt THA 30 yr ago with h/o periprosthetic fx & revision in 2022, h/o back surgery  Hard of hearing; can hear out of Left ear   PAIN:  Are you having pain: Yes 8/10 Pain location: Rt groin  Description: ache/sharp Aggravating factors: Rt WB Relieving factors: rest   PRECAUTIONS: Posterior hip   WEIGHT BEARING RESTRICTIONS No   FALLS:  Has patient fallen in last 6 months? Yes, Number of falls: 1 4/21: no falls recently 6/9: denies new falls 7/7: denies falls 8/7: denies falls     PATIENT GOALS build up strength, get rid of assistance for toileting and getting in/out of bed, no AD in gait, stairs, Son's wedding in [place] first of June     OBJECTIVE: *All objective findings taken at [facility] unless otherwise noted.    PATIENT SURVEYS:  LEFS 39 4/21: 46 5/19: 43 6/9: 43 7/7: 43  0 on walking 2 blocks, 1 mile & stiars- would like to see these improve in particular 8/7: 42  Still 0/4 on prev stated items     COGNITION:          Overall cognitive status: Within functional limits for tasks assessed                        SENSATION:          Bil hand/feet neuropathy unchanged since evaluation      LE MMT:  6/9 measured with handheld dynamometry Normative values: hip flexion 38 lb, knee extension 79 lb   MMT Right 03/06/2022 Right/Left 04/19/22 Rt/Lt 05/17/22 Rt/Lt 06/07/22 Rt//Lt 07/05/22 Rt//Lt 07/26/22  Hip flexion 4/5 11.1/ 24.3 20.9/42.6 34.8/44.2 38.9//42.1   Knee flexion 3/5 32.3/37.9 37.7/55.3 38.7/48.6 42.2//54.9   Knee extension 3/5 12.1/42.4 20.7/60.7 25.8/50.5 24.9//50.6 28.2//66.2   (Blank rows = not tested)  6/23:unable to lift RtLE against gravity in sidelying  8/7: able to lift knee in clam independently but requires assistance for sidelying straight leg hip abduction       FUNCTIONAL TESTS:  5 times sit to stand: 38s 4/21: 18s without  use of UEs 6/9:16s without use of UEs 8/7: 14s with UEs, too much groin pain without UEs Berg Balance Scale: 33/56 4/21: 40/50 6/9: 44/50    GAIT:   Comments: SPC in Lt hand, rt leg abducted and externally rotated, antalgic on Rt with pressure placed through cane 4/21: SPC in Lt hand, Rt leg no longer abd /ER; does demo Rt trunk SB in Rt stance phase 6/9: SPC in Lt hand, mild Rt trunk SB in Rt stance phase, maintains upright posture 7/7: with SPC: maint upright posture wihtout Rt sidebend in stance phase; without SPC- cont upright posture and good swing through but does present sidebend to the Rt in Rt stance phase 8/7: with SPC, upright mosture with minimal deviation from midline and minimal use of cane for assistance, antalgic without cane and flexes trunk rather than rotates.         TODAY'S TREATMENT   Treatment                            8/7: Nu step 5 min L6 UE & LE Attempted 5TSTS- unable to perform 1 stand, used hands to complete today MANUAL: lateral distraction of FA joint in hooklying, roller to quads, STM to pectineus & iliopsoas    08/02/22: Nu step 5 min L6 Ue & LE MANUAL:  roller to glut med/min/TFL, quads Stnaind hip abd with slgiht flexion at bar Seated trunk flexion with physioball Seated LAQ ball bw knees Seated clam- yellow tband- focus on coordinated movement  Combo of abd yellow tband + LAQ Sidelying clams- PT blocked roll back but did not assist in hip ER Sidelying hip abd- PT assisted 4x5      PATIENT EDUCATION:  Education details: Anatomy of condition, POC, HEP, exercise form/rationale  Person educated: Patient and Spouse Education method: Explanation, Demonstration, Tactile cues, and Verbal cues Education comprehension: verbalized understanding, returned demonstration, verbal cues required, tactile cues required, and needs further education     HOME EXERCISE PROGRAM: [id number]   ASSESSMENT:   CLINICAL IMPRESSION: Overall pt has increased in  strength, balance and gait pattern but was unable to perform sit to stand without UEs today due to pain (which he was able to do at last PN). Has an app[facility] at the [facility] tomorrow and I requested imaging of the FA joint and pelvis to check for pathological fracture. Will continue with aquatic appointment as scheduled.    OBJECTIVE IMPAIRMENTS Abnormal gait, decreased activity tolerance, decreased balance, difficulty walking, decreased strength, impaired sensation, improper body mechanics, postural dysfunction, and pain.    ACTIVITY LIMITATIONS cleaning, community activity, driving, meal prep, laundry, and shopping.    PERSONAL FACTORS  see problem list  are also affecting patient's functional outcome.      REHAB POTENTIAL: Good   CLINICAL DECISION MAKING: Unstable/unpredictable   EVALUATION COMPLEXITY: High     GOALS: Goals reviewed with patient? Yes   SHORT TERM GOALS:   Able to enter/exit pool independently with use of hand rails Baseline: will begin training Target date: 04/03/2022 Goal status: Achieved    2.  Good tolerance to exercise without limitations in next day due to pain Baseline: Target date: 04/03/2022 Goal status: achieved    3.  Able to shift weight to perform step taps with UE assist Baseline: unable at eval Target date: 04/03/2022 Goal status: Achieved    4.  Able to ambulate with centered weight distribution Baseline: able with [facility] Target date: 04/03/2022 Goal status: achieved       LONG TERM GOALS:   BERG to improve by MDC of 6 points Baseline: 33 at eval Target date:  07/06/22 Goal status: achieved   2.  Pt will ambulate safely around his home wihtout use of AD Baseline: achieved Goal status: ongoing   3.  Able to navigate stairs in home safely and confidently Baseline:navigating stairs with "up with the good, down with the bad" pattern  Goal status: ongoing   4.  LEFS to improve by MDC Baseline: MDC is 9 points  Goal status: INITIAL   5.  Pt will  demo 5TSTS in 20s or less Baseline: 38s at eval  Goal status: Achieved - 04/19/22   6.  Pt will be able to rid home of aid devices Baseline: taken toilet seats out, uses stool in shower but for convenience, not need; still using walker to carry things  Goal status: partially met  7. Start walking around the neighborhood, ultimately without AD  Baseline: walking but with cane.     Goal status: ongoing   PLAN: PT FREQUENCY: 1-2x/week   PT DURATION: other: 4 months   PLANNED INTERVENTIONS: Therapeutic exercises, Therapeutic activity, Neuromuscular re-education, Balance training, Gait training, Patient/Family education, Joint mobilization, Stair training, Aquatic Therapy, Electrical stimulation, Cryotherapy, Moist heat, Taping, and Manual therapy   PLAN FOR NEXT SESSION: Rt  quad activation, continue CKC exercises focusing on midline weight distribution/shifting Rt, new imaging from [place] appt on 8/8?  Joelie Schou C. Ruari Duggan PT, DPT 08/05/22 12:39 PM

## 2022-08-05 ENCOUNTER — Encounter (HOSPITAL_BASED_OUTPATIENT_CLINIC_OR_DEPARTMENT_OTHER): Payer: Self-pay | Admitting: Physical Therapy

## 2022-08-05 ENCOUNTER — Ambulatory Visit (HOSPITAL_BASED_OUTPATIENT_CLINIC_OR_DEPARTMENT_OTHER): Payer: Medicare Other | Admitting: Physical Therapy

## 2022-08-05 DIAGNOSIS — M79604 Pain in right leg: Secondary | ICD-10-CM

## 2022-08-05 DIAGNOSIS — M6281 Muscle weakness (generalized): Secondary | ICD-10-CM | POA: Diagnosis not present

## 2022-08-05 DIAGNOSIS — M25551 Pain in right hip: Secondary | ICD-10-CM

## 2022-08-05 DIAGNOSIS — R262 Difficulty in walking, not elsewhere classified: Secondary | ICD-10-CM

## 2022-08-05 DIAGNOSIS — I2699 Other pulmonary embolism without acute cor pulmonale: Secondary | ICD-10-CM | POA: Diagnosis not present

## 2022-08-06 DIAGNOSIS — I2699 Other pulmonary embolism without acute cor pulmonale: Secondary | ICD-10-CM | POA: Diagnosis not present

## 2022-08-07 ENCOUNTER — Ambulatory Visit (HOSPITAL_BASED_OUTPATIENT_CLINIC_OR_DEPARTMENT_OTHER): Payer: Medicare Other | Admitting: Physical Therapy

## 2022-08-07 ENCOUNTER — Encounter (INDEPENDENT_AMBULATORY_CARE_PROVIDER_SITE_OTHER): Payer: Self-pay

## 2022-08-07 ENCOUNTER — Encounter (HOSPITAL_BASED_OUTPATIENT_CLINIC_OR_DEPARTMENT_OTHER): Payer: Self-pay | Admitting: Physical Therapy

## 2022-08-07 DIAGNOSIS — M25551 Pain in right hip: Secondary | ICD-10-CM | POA: Diagnosis not present

## 2022-08-07 DIAGNOSIS — M6281 Muscle weakness (generalized): Secondary | ICD-10-CM

## 2022-08-07 DIAGNOSIS — M79604 Pain in right leg: Secondary | ICD-10-CM | POA: Diagnosis not present

## 2022-08-07 DIAGNOSIS — R262 Difficulty in walking, not elsewhere classified: Secondary | ICD-10-CM

## 2022-08-07 NOTE — Therapy (Signed)
OUTPATIENT PHYSICAL THERAPY TREATMENT NOTE     Patient Name: Bruce Ball MRN: [id number] DOB:[birth year], 76 y.o., male Today's Date: 08/07/2022  PCP: [name], MD REFERRING PROVIDER: [name], MD   PT End of Session - 08/07/22 1239     Visit Number 37    Number of Visits 68    Date for PT Re-Evaluation 09/16/22    Authorization Type MCR    Progress Note Due on Visit 73    PT Start Time 1239    PT Stop Time 1319    PT Time Calculation (min) 40 min    Activity Tolerance Patient limited by pain    Behavior During Therapy [facility] for tasks assessed/performed                  Past Medical History:  Diagnosis Date   Ankylosing spondylitis ([place])    Arthritis of right knee    Basal cell carcinoma    Chronic back pain greater than 3 months duration    Chronic leg pain    CKD (chronic kidney disease), stage II    Coronary artery calcification seen on CAT scan 03/27/2021   Deafness in right ear    Dyspnea    cause by PE   Essential tremor    Extrinsic asthma, unspecified 04/07/2009   PT DENIES   GERD (gastroesophageal reflux disease)    Hearing loss    Hip problem    History of kidney stones    HYPERTENSION 10/29/2007   HYPOTENSION 08/13/2010   LOW BACK PAIN 04/21/2008   Mild aortic stenosis    NEPHROLITHIASIS, HX OF 04/21/2008   Neuropathy    Osteopenia    Other hyperlipidemia    Peptic ulcer    Pneumonia    PUD, HX OF 04/21/2008   Pulmonary emboli ([place]) 2021   noted on CT 12-2019   Renal disorder    Stage 3 chronic kidney disease (HCC)    Thyroid nodule    Past Surgical History:  Procedure Laterality Date   BACK SURGERY     bone cancer  1970   rt femur removed and steel rod placed   CYSTOSCOPY WITH RETROGRADE PYELOGRAM, URETEROSCOPY AND STENT PLACEMENT Right 11/06/2017   Procedure: CYSTOSCOPY WITH  STENT PLACEMENT RIGHT;  Surgeon: Raynelle Bring, MD;  Location: [facility];  Service: Urology;  Laterality: Right;   CYSTOSCOPY WITH RETROGRADE  PYELOGRAM, URETEROSCOPY AND STENT PLACEMENT Left 06/09/2019   Procedure: CYSTOSCOPY WITH RETROGRADE PYELOGRAM, URETEROSCOPY AND STENT PLACEMENT X2;  Surgeon: [name], MD;  Location: [facility];  Service: Urology;  Laterality: Left;   CYSTOSCOPY WITH RETROGRADE PYELOGRAM, URETEROSCOPY AND STENT PLACEMENT Bilateral 03/01/2020   Procedure: CYSTOSCOPY WITH RETROGRADE PYELOGRAM, URETEROSCOPY AND STENT PLACEMENT;  Surgeon: [name], MD;  Location: [facility];  Service: Urology;  Laterality: Bilateral;  75 MINS   CYSTOSCOPY/RETROGRADE/URETEROSCOPY     1 year ago at [place] in [place] Right 11/24/2017   Procedure: CYSTOSCOPY/URETEROSCOPY/ RETROGRADE/STENT REMOVAL;  Surgeon: Raynelle Bring, MD;  Location: [facility];  Service: Urology;  Laterality: Right;   HOLMIUM LASER APPLICATION Bilateral 09/02/8545   Procedure: HOLMIUM LASER APPLICATION;  Surgeon: [name], MD;  Location: [facility];  Service: Urology;  Laterality: Bilateral;   prosatectomy     Patient Active Problem List   Diagnosis Date Noted   History of pulmonary embolism 10/10/2021   HTN (hypertension) 10/10/2021   Chronic diastolic CHF (congestive heart failure) ([place]) 10/10/2021   GERD (gastroesophageal reflux  disease) 10/10/2021   Ankylosing spondylitis ([place]) 10/10/2021   CKD (chronic kidney disease), stage III ([place]) 10/10/2021   Hyperlipidemia 10/10/2021   CAD (coronary artery disease) 10/10/2021   Periprosthetic fracture of shaft of femur 10/10/2021   Other fatigue 06/05/2021   CAD (coronary artery disease) 29/93/7169   Diastolic CHF ([place]) 67/89/3810   SOB (shortness of breath) on exertion 03/26/2021   Chronic anticoagulation 03/26/2021   Anemia in chronic kidney disease 01/25/2021   Acquired coagulation disorder ([place]) 12/12/2020   Senile purpura ([place]) 12/12/2020   S/P lumbar fusion 08/03/2020   Thin skin 07/25/2020   Aortic atherosclerosis ([place]) 05/17/2020   B12 deficiency 05/17/2020    Iliac artery aneurysm ([place]) 05/17/2020   Osteoporosis 04/04/2020   Idiopathic neuropathy 01/28/2020   Recurrent falls 01/28/2020   Chronic kidney disease (CKD), stage III (moderate) ([place]) 01/19/2020   Pulmonary emboli ([place]) 01/14/2020   GERD (gastroesophageal reflux disease) 12/22/2019   Essential tremor 12/22/2019   Femoral condyle fracture ([place]) 07/15/2019   Gross hematuria 06/14/2019   Hyperlipidemia 11/11/2017   Kidney stone 11/05/2017   Community acquired pneumonia of right upper lobe of lung 11/05/2017   Right ureteral stone 11/05/2017   History of total right hip replacement 03/08/2017   History of prostate cancer 03/08/2017   Personal history of prostate cancer 02/28/2017   High risk medication use 02/28/2017   DDD (degenerative disc disease), thoracic 02/28/2017   Ataxia 05/18/2016   Vestibular disequilibrium 05/18/2016   Ankylosing spondylitis of cervical region ([place]) 05/24/2014   Lung nodule, solitary 05/24/2014   Lumbosacral stenosis 06/05/2013   Peripheral edema 06/08/2012   Subcortical microvascular ischemic occlusive disease 08/13/2010   Extrinsic asthma 04/07/2009   Backache 04/21/2008   History of peptic ulcer disease 04/21/2008   Essential hypertension 10/29/2007  REFERRING PROVIDER: [name], MD   REFERRING DIAG:  M54.50,G89.29 (ICD-10-CM) - Chronic bilateral low back pain without sciatica  R26.89 (ICD-10-CM) - Other abnormalities of gait and mobility      THERAPY DIAG:  Pain in right hip   Pain in right leg   Difficulty in walking, not elsewhere classified   Muscle weakness (generalized)   ONSET DATE: October 10, 2021 recent fall causing fracture  SUBJECTIVE:                                                                                                                                  SUBJECTIVE STATEMENT: Pt reports he had imaging yesterday, but has not received results yet from the [place].  He states he plans to reach back out to his  doctor at [facility].  He is curious if his recent weight gain has attributed to his increased hip/groin pain.  He reports his Lt groin has began to be painful too.  He states he has held off on walking in neighborhood and from going to water aerobics.  He arrives ambulating with rollator.    PERTINENT HISTORY:  Stenosis, Rt THA 48 yr ago with h/o periprosthetic fx & revision in 2022, h/o back surgery  Hard of hearing; can hear out of Left ear   PAIN:  Are you having pain: Yes 8/10 Pain location: Rt/Lt groin  Description: ache/sharp Aggravating factors: Rt WB, walking Relieving factors: rest   PRECAUTIONS: Posterior hip   WEIGHT BEARING RESTRICTIONS No   FALLS:  Has patient fallen in last 6 months? Yes, Number of falls: 1 4/21: no falls recently 6/9: denies new falls 7/7: denies falls 8/7: denies falls     PATIENT GOALS build up strength, get rid of assistance for toileting and getting in/out of bed, no AD in gait, stairs, Son's wedding in [place] first of June     OBJECTIVE: *All objective findings taken at [facility] unless otherwise noted.    PATIENT SURVEYS:  LEFS 39 4/21: 46 5/19: 43 6/9: 43 7/7: 43  0 on walking 2 blocks, 1 mile & stiars- would like to see these improve in particular 8/7: 42  Still 0/4 on prev stated items     COGNITION:          Overall cognitive status: Within functional limits for tasks assessed                        SENSATION:          Bil hand/feet neuropathy unchanged since evaluation      LE MMT:  6/9 measured with handheld dynamometry Normative values: hip flexion 38 lb, knee extension 79 lb   MMT Right 03/06/2022 Right/Left 04/19/22 Rt/Lt 05/17/22 Rt/Lt 06/07/22 Rt//Lt 07/05/22 Rt//Lt 07/26/22  Hip flexion 4/5 11.1/ 24.3 20.9/42.6 34.8/44.2 38.9//42.1   Knee flexion 3/5 32.3/37.9 37.7/55.3 38.7/48.6 42.2//54.9   Knee extension 3/5 12.1/42.4 20.7/60.7 25.8/50.5 24.9//50.6 28.2//66.2   (Blank rows = not tested)  6/23:unable to lift RtLE  against gravity in sidelying  8/7: able to lift knee in clam independently but requires assistance for sidelying straight leg hip abduction       FUNCTIONAL TESTS:  5 times sit to stand: 38s 4/21: 18s without use of UEs 6/9:16s without use of UEs 8/7: 14s with UEs, too much groin pain without UEs Berg Balance Scale: 33/56 4/21: 40/50 6/9: 44/50    GAIT:   Comments: SPC in Lt hand, rt leg abducted and externally rotated, antalgic on Rt with pressure placed through cane 4/21: SPC in Lt hand, Rt leg no longer abd /ER; does demo Rt trunk SB in Rt stance phase 6/9: SPC in Lt hand, mild Rt trunk SB in Rt stance phase, maintains upright posture 7/7: with SPC: maint upright posture wihtout Rt sidebend in stance phase; without SPC- cont upright posture and good swing through but does present sidebend to the Rt in Rt stance phase 8/7: with SPC, upright mosture with minimal deviation from midline and minimal use of cane for assistance, antalgic without cane and flexes trunk rather than rotates.         TODAY'S TREATMENT  08/07/22: Pt seen for aquatic therapy today.  Treatment took place in water 3.25-4.5 ft in depth at the [facility]. Temp of water was 91.  Pt entered/exited the pool via stairs independently with step to pattern and bilat rail.   In water >4 ft deep unsupported:    * Warm up of forward, backward gait and side stepping  * Holding wall:  side to side lunge; squats * with double noodle under arms for added  buoyancy:  alternating SLS with opp leg circle (knee straight); warrior 1 lunge pose for hip flexor stretch, alternating legs - holding for 5-10sec each; weight shifts L/R * supported by yellow noodle under arms, cycling x 2 sets with therapist steadying pt from behind * Seated on bench in water with blue ankle buoys donned: alternating LAQ with DF for LE stretch and hamstring curl on pull down * STS from bench in water x 5 * plank with hip ext with hands on  bench in water   Pt requires the buoyancy and hydrostatic pressure of water for support, and to offload joints by unweighting joint load by at least 50 % in navel deep water and by at least 75-80% in chest to neck deep water.  Viscosity of the water is needed for resistance of strengthening. Water current perturbations provides challenge to standing balance requiring increased core activation.  Treatment                            8/7: Nu step 5 min L6 UE & LE Attempted 5TSTS- unable to perform 1 stand, used hands to complete today MANUAL: lateral distraction of FA joint in hooklying, roller to quads, STM to pectineus & iliopsoas    08/02/22: Nu step 5 min L6 Ue & LE MANUAL: roller to glut med/min/TFL, quads Stnaind hip abd with slgiht flexion at bar Seated trunk flexion with physioball Seated LAQ ball bw knees Seated clam- yellow tband- focus on coordinated movement  Combo of abd yellow tband + LAQ Sidelying clams- PT blocked roll back but did not assist in hip ER Sidelying hip abd- PT assisted 4x5      PATIENT EDUCATION:  Education details: Anatomy of condition, POC, HEP, exercise form/rationale  Person educated: Patient and Spouse Education method: Explanation, Demonstration, Tactile cues, and Verbal cues Education comprehension: verbalized understanding, returned demonstration, verbal cues required, tactile cues required, and needs further education     HOME EXERCISE PROGRAM: [id number]   ASSESSMENT:   CLINICAL IMPRESSION: Focus of aquatic session on reduction of pain, normalization of gait and maintenance of hip/knee/ankle ROM.  Pt reports he is pain free while submerged to 4.5 ft of water.  Will continued to await imaging from [place] the FA joint and pelvis to check for pathological fracture. No new goals met since flare up of symptoms.   OBJECTIVE IMPAIRMENTS Abnormal gait, decreased activity tolerance, decreased balance, difficulty walking, decreased strength, impaired  sensation, improper body mechanics, postural dysfunction, and pain.    ACTIVITY LIMITATIONS cleaning, community activity, driving, meal prep, laundry, and shopping.    PERSONAL FACTORS  see problem list  are also affecting patient's functional outcome.      REHAB POTENTIAL: Good   CLINICAL DECISION MAKING: Unstable/unpredictable   EVALUATION COMPLEXITY: High     GOALS: Goals reviewed with patient? Yes   SHORT TERM GOALS:   Able to enter/exit pool independently with use of hand rails Baseline: will begin training Target date: 04/03/2022 Goal status: Achieved    2.  Good tolerance to exercise without limitations in next day due to pain Baseline: Target date: 04/03/2022 Goal status: achieved    3.  Able to shift weight to perform step taps with UE assist Baseline: unable at eval Target date: 04/03/2022 Goal status: Achieved    4.  Able to ambulate with centered weight distribution Baseline: able with [facility] Target date: 04/03/2022 Goal status: achieved  LONG TERM GOALS:   BERG to improve by MDC of 6 points Baseline: 33 at eval Target date:  07/06/22 Goal status: achieved   2.  Pt will ambulate safely around his home wihtout use of AD Baseline: achieved Goal status: ongoing   3.  Able to navigate stairs in home safely and confidently Baseline:navigating stairs with "up with the good, down with the bad" pattern  Goal status: ongoing   4.  LEFS to improve by MDC Baseline: MDC is 9 points  Goal status: INITIAL   5.  Pt will demo 5TSTS in 20s or less Baseline: 38s at eval  Goal status: Achieved - 04/19/22   6.  Pt will be able to rid home of aid devices Baseline: taken toilet seats out, uses stool in shower but for convenience, not need; still using walker to carry things  Goal status: partially met  7. Start walking around the neighborhood, ultimately without AD  Baseline: walking but with cane.     Goal status: ongoing   PLAN: PT FREQUENCY: 1-2x/week    PT DURATION: other: 4 months   PLANNED INTERVENTIONS: Therapeutic exercises, Therapeutic activity, Neuromuscular re-education, Balance training, Gait training, Patient/Family education, Joint mobilization, Stair training, Aquatic Therapy, Electrical stimulation, Cryotherapy, Moist heat, Taping, and Manual therapy   PLAN FOR NEXT SESSION: Rt quad activation, continue CKC exercises focusing on midline weight distribution/shifting Rt, new imaging from [place] appt on 8/8?  Kerin Perna, PTA 08/07/22 1:57 PM

## 2022-08-14 ENCOUNTER — Encounter (HOSPITAL_BASED_OUTPATIENT_CLINIC_OR_DEPARTMENT_OTHER): Payer: Self-pay | Admitting: Physical Therapy

## 2022-08-14 ENCOUNTER — Ambulatory Visit (HOSPITAL_BASED_OUTPATIENT_CLINIC_OR_DEPARTMENT_OTHER): Payer: Medicare Other | Admitting: Physical Therapy

## 2022-08-14 DIAGNOSIS — M25551 Pain in right hip: Secondary | ICD-10-CM | POA: Diagnosis not present

## 2022-08-14 DIAGNOSIS — M6281 Muscle weakness (generalized): Secondary | ICD-10-CM | POA: Diagnosis not present

## 2022-08-14 DIAGNOSIS — R262 Difficulty in walking, not elsewhere classified: Secondary | ICD-10-CM

## 2022-08-14 DIAGNOSIS — M79604 Pain in right leg: Secondary | ICD-10-CM | POA: Diagnosis not present

## 2022-08-14 NOTE — Therapy (Signed)
OUTPATIENT PHYSICAL THERAPY TREATMENT NOTE     Patient Name: Bruce Ball MRN: [id number] DOB:08-22-1946, 76 y.o., male Today's Date: 08/14/2022  PCP: [name], MD REFERRING PROVIDER: [name], MD   PT End of Session - 08/14/22 1726     Visit Number 38    Number of Visits 79    Date for PT Re-Evaluation 09/16/22    Authorization Type MCR    Progress Note Due on Visit 57    PT Start Time 1631    PT Stop Time 1713    PT Time Calculation (min) 42 min    Activity Tolerance Patient limited by pain    Behavior During Therapy [facility] for tasks assessed/performed                   Past Medical History:  Diagnosis Date   Ankylosing spondylitis ([place])    Arthritis of right knee    Basal cell carcinoma    Chronic back pain greater than 3 months duration    Chronic leg pain    CKD (chronic kidney disease), stage II    Coronary artery calcification seen on CAT scan 03/27/2021   Deafness in right ear    Dyspnea    cause by PE   Essential tremor    Extrinsic asthma, unspecified 04/07/2009   PT DENIES   GERD (gastroesophageal reflux disease)    Hearing loss    Hip problem    History of kidney stones    HYPERTENSION 10/29/2007   HYPOTENSION 08/13/2010   LOW BACK PAIN 04/21/2008   Mild aortic stenosis    NEPHROLITHIASIS, HX OF 04/21/2008   Neuropathy    Osteopenia    Other hyperlipidemia    Peptic ulcer    Pneumonia    PUD, HX OF 04/21/2008   Pulmonary emboli ([place]) 2021   noted on CT 12-2019   Renal disorder    Stage 3 chronic kidney disease (HCC)    Thyroid nodule    Past Surgical History:  Procedure Laterality Date   BACK SURGERY     bone cancer  1970   rt femur removed and steel rod placed   CYSTOSCOPY WITH RETROGRADE PYELOGRAM, URETEROSCOPY AND STENT PLACEMENT Right 11/06/2017   Procedure: CYSTOSCOPY WITH  STENT PLACEMENT RIGHT;  Surgeon: Raynelle Bring, MD;  Location: [facility];  Service: Urology;  Laterality: Right;   CYSTOSCOPY WITH RETROGRADE  PYELOGRAM, URETEROSCOPY AND STENT PLACEMENT Left 06/09/2019   Procedure: CYSTOSCOPY WITH RETROGRADE PYELOGRAM, URETEROSCOPY AND STENT PLACEMENT X2;  Surgeon: [name], MD;  Location: [facility];  Service: Urology;  Laterality: Left;   CYSTOSCOPY WITH RETROGRADE PYELOGRAM, URETEROSCOPY AND STENT PLACEMENT Bilateral 03/01/2020   Procedure: CYSTOSCOPY WITH RETROGRADE PYELOGRAM, URETEROSCOPY AND STENT PLACEMENT;  Surgeon: [name], MD;  Location: [facility];  Service: Urology;  Laterality: Bilateral;  75 MINS   CYSTOSCOPY/RETROGRADE/URETEROSCOPY     1 year ago at [place] in [place] Right 11/24/2017   Procedure: CYSTOSCOPY/URETEROSCOPY/ RETROGRADE/STENT REMOVAL;  Surgeon: Raynelle Bring, MD;  Location: [facility];  Service: Urology;  Laterality: Right;   HOLMIUM LASER APPLICATION Bilateral 05/31/8365   Procedure: HOLMIUM LASER APPLICATION;  Surgeon: [name], MD;  Location: [facility];  Service: Urology;  Laterality: Bilateral;   prosatectomy     Patient Active Problem List   Diagnosis Date Noted   History of pulmonary embolism 10/10/2021   HTN (hypertension) 10/10/2021   Chronic diastolic CHF (congestive heart failure) ([place]) 10/10/2021   GERD (gastroesophageal  reflux disease) 10/10/2021   Ankylosing spondylitis ([place]) 10/10/2021   CKD (chronic kidney disease), stage III ([place]) 10/10/2021   Hyperlipidemia 10/10/2021   CAD (coronary artery disease) 10/10/2021   Periprosthetic fracture of shaft of femur 10/10/2021   Other fatigue 06/05/2021   CAD (coronary artery disease) 16/09/9603   Diastolic CHF ([place]) 54/08/8118   SOB (shortness of breath) on exertion 03/26/2021   Chronic anticoagulation 03/26/2021   Anemia in chronic kidney disease 01/25/2021   Acquired coagulation disorder ([place]) 12/12/2020   Senile purpura ([place]) 12/12/2020   S/P lumbar fusion 08/03/2020   Thin skin 07/25/2020   Aortic atherosclerosis ([place]) 05/17/2020   B12 deficiency 05/17/2020    Iliac artery aneurysm ([place]) 05/17/2020   Osteoporosis 04/04/2020   Idiopathic neuropathy 01/28/2020   Recurrent falls 01/28/2020   Chronic kidney disease (CKD), stage III (moderate) ([place]) 01/19/2020   Pulmonary emboli ([place]) 01/14/2020   GERD (gastroesophageal reflux disease) 12/22/2019   Essential tremor 12/22/2019   Femoral condyle fracture ([place]) 07/15/2019   Gross hematuria 06/14/2019   Hyperlipidemia 11/11/2017   Kidney stone 11/05/2017   Community acquired pneumonia of right upper lobe of lung 11/05/2017   Right ureteral stone 11/05/2017   History of total right hip replacement 03/08/2017   History of prostate cancer 03/08/2017   Personal history of prostate cancer 02/28/2017   High risk medication use 02/28/2017   DDD (degenerative disc disease), thoracic 02/28/2017   Ataxia 05/18/2016   Vestibular disequilibrium 05/18/2016   Ankylosing spondylitis of cervical region ([place]) 05/24/2014   Lung nodule, solitary 05/24/2014   Lumbosacral stenosis 06/05/2013   Peripheral edema 06/08/2012   Subcortical microvascular ischemic occlusive disease 08/13/2010   Extrinsic asthma 04/07/2009   Backache 04/21/2008   History of peptic ulcer disease 04/21/2008   Essential hypertension 10/29/2007  REFERRING PROVIDER: [name], MD   REFERRING DIAG:  M54.50,G89.29 (ICD-10-CM) - Chronic bilateral low back pain without sciatica  R26.89 (ICD-10-CM) - Other abnormalities of gait and mobility      THERAPY DIAG:  Pain in right hip   Pain in right leg   Difficulty in walking, not elsewhere classified   Muscle weakness (generalized)   ONSET DATE: October 10, 2021 recent fall causing fracture  SUBJECTIVE:                                                                                                                                  SUBJECTIVE STATEMENT: "Today is good day. Pain is low.  I haven't done anything differently today then usual. They said there is not any fractures (from  xray).  Pt reports he had imaging yesterday, but has not received results yet from the [place].  He states he plans to reach back out to his doctor at [facility].  He is curious if his recent weight gain has attributed to his increased hip/groin pain.  He reports his Lt groin has began to be painful too.  He  states he has held off on walking in neighborhood and from going to water aerobics.  He arrives ambulating with rollator.    PERTINENT HISTORY:  Stenosis, Rt THA 14 yr ago with h/o periprosthetic fx & revision in 2022, h/o back surgery  Hard of hearing; can hear out of Left ear   PAIN:  Are you having pain: Yes 4/10 Pain location: Rt/Lt groin  Description: ache/sharp Aggravating factors: Rt WB, walking Relieving factors: rest   PRECAUTIONS: Posterior hip   WEIGHT BEARING RESTRICTIONS No   FALLS:  Has patient fallen in last 6 months? Yes, Number of falls: 1 4/21: no falls recently 6/9: denies new falls 7/7: denies falls 8/7: denies falls     PATIENT GOALS build up strength, get rid of assistance for toileting and getting in/out of bed, no AD in gait, stairs, Son's wedding in [place] first of June     OBJECTIVE: *All objective findings taken at [facility] unless otherwise noted.    PATIENT SURVEYS:  LEFS 39 4/21: 46 5/19: 43 6/9: 43 7/7: 43  0 on walking 2 blocks, 1 mile & stiars- would like to see these improve in particular 8/7: 42  Still 0/4 on prev stated items     COGNITION:          Overall cognitive status: Within functional limits for tasks assessed                        SENSATION:          Bil hand/feet neuropathy unchanged since evaluation      LE MMT:  6/9 measured with handheld dynamometry Normative values: hip flexion 38 lb, knee extension 79 lb   MMT Right 03/06/2022 Right/Left 04/19/22 Rt/Lt 05/17/22 Rt/Lt 06/07/22 Rt//Lt 07/05/22 Rt//Lt 07/26/22  Hip flexion 4/5 11.1/ 24.3 20.9/42.6 34.8/44.2 38.9//42.1   Knee flexion 3/5 32.3/37.9 37.7/55.3 38.7/48.6 42.2//54.9    Knee extension 3/5 12.1/42.4 20.7/60.7 25.8/50.5 24.9//50.6 28.2//66.2   (Blank rows = not tested)  6/23:unable to lift RtLE against gravity in sidelying  8/7: able to lift knee in clam independently but requires assistance for sidelying straight leg hip abduction       FUNCTIONAL TESTS:  5 times sit to stand: 38s 4/21: 18s without use of UEs 6/9:16s without use of UEs 8/7: 14s with UEs, too much groin pain without UEs Berg Balance Scale: 33/56 4/21: 40/50 6/9: 44/50    GAIT:   Comments: SPC in Lt hand, rt leg abducted and externally rotated, antalgic on Rt with pressure placed through cane 4/21: SPC in Lt hand, Rt leg no longer abd /ER; does demo Rt trunk SB in Rt stance phase 6/9: SPC in Lt hand, mild Rt trunk SB in Rt stance phase, maintains upright posture 7/7: with SPC: maint upright posture wihtout Rt sidebend in stance phase; without SPC- cont upright posture and good swing through but does present sidebend to the Rt in Rt stance phase 8/7: with SPC, upright mosture with minimal deviation from midline and minimal use of cane for assistance, antalgic without cane and flexes trunk rather than rotates.         TODAY'S TREATMENT  08/14/22: Pt seen for aquatic therapy today.  Treatment took place in water 3.25-4.5 ft in depth at the [facility]. Temp of water was 91.  Pt entered/exited the pool via stairs independently with step to pattern and bilat rail.   In water >4 ft deep unsupported:    *  Warm up of forward, backward gait and side stepping  * Holding wall:squats,  Progressed to ue supported with yellow han buoys x10-12ea  *SLS R/L ue supportd on yellow hand buoys for hip flexor stretch, alternating legs - holding for 5-10sec each; weight shifts L/R * supported by therapist cycling Suspended sup: hip ext R/L x10ea * Seated on bench in water with blue ankle buoys donned: alternating LAQ; add/abd; flutter kick 2 x 20 * plank with hip ext with hands on  bench in water x10  Pt requires the buoyancy and hydrostatic pressure of water for support, and to offload joints by unweighting joint load by at least 50 % in navel deep water and by at least 75-80% in chest to neck deep water.  Viscosity of the water is needed for resistance of strengthening. Water current perturbations provides challenge to standing balance requiring increased core activation.  Treatment                            8/7: Nu step 5 min L6 UE & LE Attempted 5TSTS- unable to perform 1 stand, used hands to complete today MANUAL: lateral distraction of FA joint in hooklying, roller to quads, STM to pectineus & iliopsoas    08/02/22: Nu step 5 min L6 Ue & LE MANUAL: roller to glut med/min/TFL, quads Stnaind hip abd with slgiht flexion at bar Seated trunk flexion with physioball Seated LAQ ball bw knees Seated clam- yellow tband- focus on coordinated movement  Combo of abd yellow tband + LAQ Sidelying clams- PT blocked roll back but did not assist in hip ER Sidelying hip abd- PT assisted 4x5      PATIENT EDUCATION:  Education details: Anatomy of condition, POC, HEP, exercise form/rationale  Person educated: Patient and Spouse Education method: Explanation, Demonstration, Tactile cues, and Verbal cues Education comprehension: verbalized understanding, returned demonstration, verbal cues required, tactile cues required, and needs further education     HOME EXERCISE PROGRAM: [id number]   ASSESSMENT:   CLINICAL IMPRESSION: Pt continues to water walk at [facility] in am.  States there is nothing he does in the pool that increases his pain nor anything in particular that improves it.  He has noticed that he has less discomfort after session in warm water pool compared to cold. Pt with good glut engagement in suspended sup position. Decreased antalgic gait.  Good progression towards  Focus of aquatic session on reduction of pain, normalization of gait and maintenance of  hip/knee/ankle ROM.  Pt reports he is pain free while submerged to 4.5 ft of water.  Will continued to await imaging from [place] the FA joint and pelvis to check for pathological fracture. No new goals met since flare up of symptoms.   OBJECTIVE IMPAIRMENTS Abnormal gait, decreased activity tolerance, decreased balance, difficulty walking, decreased strength, impaired sensation, improper body mechanics, postural dysfunction, and pain.    ACTIVITY LIMITATIONS cleaning, community activity, driving, meal prep, laundry, and shopping.    PERSONAL FACTORS  see problem list  are also affecting patient's functional outcome.      REHAB POTENTIAL: Good   CLINICAL DECISION MAKING: Unstable/unpredictable   EVALUATION COMPLEXITY: High     GOALS: Goals reviewed with patient? Yes   SHORT TERM GOALS:   Able to enter/exit pool independently with use of hand rails Baseline: will begin training Target date: 04/03/2022 Goal status: Achieved    2.  Good tolerance to exercise without limitations in next day  due to pain Baseline: Target date: 04/03/2022 Goal status: achieved    3.  Able to shift weight to perform step taps with UE assist Baseline: unable at eval Target date: 04/03/2022 Goal status: Achieved    4.  Able to ambulate with centered weight distribution Baseline: able with [facility] Target date: 04/03/2022 Goal status: achieved       LONG TERM GOALS:   BERG to improve by MDC of 6 points Baseline: 33 at eval Target date:  07/06/22 Goal status: achieved   2.  Pt will ambulate safely around his home wihtout use of AD Baseline: achieved Goal status: ongoing   3.  Able to navigate stairs in home safely and confidently Baseline:navigating stairs with "up with the good, down with the bad" pattern  Goal status: ongoing   4.  LEFS to improve by MDC Baseline: MDC is 9 points  Goal status: INITIAL   5.  Pt will demo 5TSTS in 20s or less Baseline: 38s at eval  Goal status: Achieved -  04/19/22   6.  Pt will be able to rid home of aid devices Baseline: taken toilet seats out, uses stool in shower but for convenience, not need; still using walker to carry things  Goal status: partially met  7. Start walking around the neighborhood, ultimately without AD  Baseline: walking but with cane.     Goal status: ongoing   PLAN: PT FREQUENCY: 1-2x/week   PT DURATION: other: 4 months   PLANNED INTERVENTIONS: Therapeutic exercises, Therapeutic activity, Neuromuscular re-education, Balance training, Gait training, Patient/Family education, Joint mobilization, Stair training, Aquatic Therapy, Electrical stimulation, Cryotherapy, Moist heat, Taping, and Manual therapy   PLAN FOR NEXT SESSION: Rt quad activation, continue CKC exercises focusing on midline weight distribution/shifting Rt, new imaging from [place] appt on 8/8?  Annamarie Major) Makayia Duplessis MPT 08/14/22 604p

## 2022-08-21 ENCOUNTER — Encounter (HOSPITAL_BASED_OUTPATIENT_CLINIC_OR_DEPARTMENT_OTHER): Payer: Self-pay | Admitting: Physical Therapy

## 2022-08-21 ENCOUNTER — Ambulatory Visit (HOSPITAL_BASED_OUTPATIENT_CLINIC_OR_DEPARTMENT_OTHER): Payer: Medicare Other | Admitting: Physical Therapy

## 2022-08-21 DIAGNOSIS — M25551 Pain in right hip: Secondary | ICD-10-CM | POA: Diagnosis not present

## 2022-08-21 DIAGNOSIS — R262 Difficulty in walking, not elsewhere classified: Secondary | ICD-10-CM

## 2022-08-21 DIAGNOSIS — M6281 Muscle weakness (generalized): Secondary | ICD-10-CM

## 2022-08-21 DIAGNOSIS — M79604 Pain in right leg: Secondary | ICD-10-CM

## 2022-08-21 NOTE — Therapy (Signed)
OUTPATIENT PHYSICAL THERAPY TREATMENT NOTE     Patient Name: Bruce Ball MRN: [id number] DOB:September 18, 1946, 76 y.o., male Today's Date: 08/21/2022  PCP: [name], MD REFERRING PROVIDER: [name], MD   PT End of Session - 08/21/22 1333     Visit Number 55    Number of Visits 2    Date for PT Re-Evaluation 09/16/22    Authorization Type MCR    Progress Note Due on Visit 33    PT Start Time 0321    PT Stop Time 2248    PT Time Calculation (min) 44 min    Activity Tolerance Patient limited by pain    Behavior During Therapy [facility] for tasks assessed/performed                   Past Medical History:  Diagnosis Date   Ankylosing spondylitis ([place])    Arthritis of right knee    Basal cell carcinoma    Chronic back pain greater than 3 months duration    Chronic leg pain    CKD (chronic kidney disease), stage II    Coronary artery calcification seen on CAT scan 03/27/2021   Deafness in right ear    Dyspnea    cause by PE   Essential tremor    Extrinsic asthma, unspecified 04/07/2009   PT DENIES   GERD (gastroesophageal reflux disease)    Hearing loss    Hip problem    History of kidney stones    HYPERTENSION 10/29/2007   HYPOTENSION 08/13/2010   LOW BACK PAIN 04/21/2008   Mild aortic stenosis    NEPHROLITHIASIS, HX OF 04/21/2008   Neuropathy    Osteopenia    Other hyperlipidemia    Peptic ulcer    Pneumonia    PUD, HX OF 04/21/2008   Pulmonary emboli ([place]) 2021   noted on CT 12-2019   Renal disorder    Stage 3 chronic kidney disease (HCC)    Thyroid nodule    Past Surgical History:  Procedure Laterality Date   BACK SURGERY     bone cancer  1970   rt femur removed and steel rod placed   CYSTOSCOPY WITH RETROGRADE PYELOGRAM, URETEROSCOPY AND STENT PLACEMENT Right 11/06/2017   Procedure: CYSTOSCOPY WITH  STENT PLACEMENT RIGHT;  Surgeon: Raynelle Bring, MD;  Location: [facility];  Service: Urology;  Laterality: Right;   CYSTOSCOPY WITH RETROGRADE  PYELOGRAM, URETEROSCOPY AND STENT PLACEMENT Left 06/09/2019   Procedure: CYSTOSCOPY WITH RETROGRADE PYELOGRAM, URETEROSCOPY AND STENT PLACEMENT X2;  Surgeon: [name], MD;  Location: [facility];  Service: Urology;  Laterality: Left;   CYSTOSCOPY WITH RETROGRADE PYELOGRAM, URETEROSCOPY AND STENT PLACEMENT Bilateral 03/01/2020   Procedure: CYSTOSCOPY WITH RETROGRADE PYELOGRAM, URETEROSCOPY AND STENT PLACEMENT;  Surgeon: [name], MD;  Location: [facility];  Service: Urology;  Laterality: Bilateral;  75 MINS   CYSTOSCOPY/RETROGRADE/URETEROSCOPY     1 year ago at [place] in [place] Right 11/24/2017   Procedure: CYSTOSCOPY/URETEROSCOPY/ RETROGRADE/STENT REMOVAL;  Surgeon: Raynelle Bring, MD;  Location: [facility];  Service: Urology;  Laterality: Right;   HOLMIUM LASER APPLICATION Bilateral 02/04/36   Procedure: HOLMIUM LASER APPLICATION;  Surgeon: [name], MD;  Location: [facility];  Service: Urology;  Laterality: Bilateral;   prosatectomy     Patient Active Problem List   Diagnosis Date Noted   History of pulmonary embolism 10/10/2021   HTN (hypertension) 10/10/2021   Chronic diastolic CHF (congestive heart failure) ([place]) 10/10/2021   GERD (gastroesophageal  reflux disease) 10/10/2021   Ankylosing spondylitis ([place]) 10/10/2021   CKD (chronic kidney disease), stage III ([place]) 10/10/2021   Hyperlipidemia 10/10/2021   CAD (coronary artery disease) 10/10/2021   Periprosthetic fracture of shaft of femur 10/10/2021   Other fatigue 06/05/2021   CAD (coronary artery disease) 57/26/2035   Diastolic CHF ([place]) 59/74/1638   SOB (shortness of breath) on exertion 03/26/2021   Chronic anticoagulation 03/26/2021   Anemia in chronic kidney disease 01/25/2021   Acquired coagulation disorder ([place]) 12/12/2020   Senile purpura ([place]) 12/12/2020   S/P lumbar fusion 08/03/2020   Thin skin 07/25/2020   Aortic atherosclerosis ([place]) 05/17/2020   B12 deficiency 05/17/2020    Iliac artery aneurysm ([place]) 05/17/2020   Osteoporosis 04/04/2020   Idiopathic neuropathy 01/28/2020   Recurrent falls 01/28/2020   Chronic kidney disease (CKD), stage III (moderate) ([place]) 01/19/2020   Pulmonary emboli ([place]) 01/14/2020   GERD (gastroesophageal reflux disease) 12/22/2019   Essential tremor 12/22/2019   Femoral condyle fracture ([place]) 07/15/2019   Gross hematuria 06/14/2019   Hyperlipidemia 11/11/2017   Kidney stone 11/05/2017   Community acquired pneumonia of right upper lobe of lung 11/05/2017   Right ureteral stone 11/05/2017   History of total right hip replacement 03/08/2017   History of prostate cancer 03/08/2017   Personal history of prostate cancer 02/28/2017   High risk medication use 02/28/2017   DDD (degenerative disc disease), thoracic 02/28/2017   Ataxia 05/18/2016   Vestibular disequilibrium 05/18/2016   Ankylosing spondylitis of cervical region ([place]) 05/24/2014   Lung nodule, solitary 05/24/2014   Lumbosacral stenosis 06/05/2013   Peripheral edema 06/08/2012   Subcortical microvascular ischemic occlusive disease 08/13/2010   Extrinsic asthma 04/07/2009   Backache 04/21/2008   History of peptic ulcer disease 04/21/2008   Essential hypertension 10/29/2007  REFERRING PROVIDER: [name], MD   REFERRING DIAG:  M54.50,G89.29 (ICD-10-CM) - Chronic bilateral low back pain without sciatica  R26.89 (ICD-10-CM) - Other abnormalities of gait and mobility      THERAPY DIAG:  Pain in right hip   Pain in right leg   Difficulty in walking, not elsewhere classified   Muscle weakness (generalized)   ONSET DATE: October 10, 2021 recent fall causing fracture  SUBJECTIVE:                                                                                                                                  SUBJECTIVE STATEMENT: "No pain at the moment"    PERTINENT HISTORY:  Stenosis, Rt THA 32 yr ago with h/o periprosthetic fx & revision in 2022, h/o  back surgery  Hard of hearing; can hear out of Left ear   PAIN:  Are you having pain: Yes 1-2/10 Pain location: Rt/Lt groin  Description: ache/sharp Aggravating factors: Rt WB, walking Relieving factors: rest   PRECAUTIONS: Posterior hip   WEIGHT BEARING RESTRICTIONS No   FALLS:  Has patient fallen in last 6 months?  Yes, Number of falls: 1 4/21: no falls recently 6/9: denies new falls 7/7: denies falls 8/7: denies falls     PATIENT GOALS build up strength, get rid of assistance for toileting and getting in/out of bed, no AD in gait, stairs, Son's wedding in [place] first of June     OBJECTIVE: *All objective findings taken at [facility] unless otherwise noted.    PATIENT SURVEYS:  LEFS 39 4/21: 46 5/19: 43 6/9: 43 7/7: 43  0 on walking 2 blocks, 1 mile & stiars- would like to see these improve in particular 8/7: 42  Still 0/4 on prev stated items     COGNITION:          Overall cognitive status: Within functional limits for tasks assessed                        SENSATION:          Bil hand/feet neuropathy unchanged since evaluation      LE MMT:  6/9 measured with handheld dynamometry Normative values: hip flexion 38 lb, knee extension 79 lb   MMT Right 03/06/2022 Right/Left 04/19/22 Rt/Lt 05/17/22 Rt/Lt 06/07/22 Rt//Lt 07/05/22 Rt//Lt 07/26/22  Hip flexion 4/5 11.1/ 24.3 20.9/42.6 34.8/44.2 38.9//42.1   Knee flexion 3/5 32.3/37.9 37.7/55.3 38.7/48.6 42.2//54.9   Knee extension 3/5 12.1/42.4 20.7/60.7 25.8/50.5 24.9//50.6 28.2//66.2   (Blank rows = not tested)  6/23:unable to lift RtLE against gravity in sidelying  8/7: able to lift knee in clam independently but requires assistance for sidelying straight leg hip abduction       FUNCTIONAL TESTS:  5 times sit to stand: 38s 4/21: 18s without use of UEs 6/9:16s without use of UEs 8/7: 14s with UEs, too much groin pain without UEs Berg Balance Scale: 33/56 4/21: 40/50 6/9: 44/50    GAIT:   Comments: SPC in  Lt hand, rt leg abducted and externally rotated, antalgic on Rt with pressure placed through cane 4/21: SPC in Lt hand, Rt leg no longer abd /ER; does demo Rt trunk SB in Rt stance phase 6/9: SPC in Lt hand, mild Rt trunk SB in Rt stance phase, maintains upright posture 7/7: with SPC: maint upright posture wihtout Rt sidebend in stance phase; without SPC- cont upright posture and good swing through but does present sidebend to the Rt in Rt stance phase 8/7: with SPC, upright mosture with minimal deviation from midline and minimal use of cane for assistance, antalgic without cane and flexes trunk rather than rotates.         TODAY'S TREATMENT   08/21/22: Pt seen for aquatic therapy today.  Treatment took place in water 3.25-4.5 ft in depth at the [facility]. Temp of water was 91.  Pt entered/exited the pool via stairs independently with step to pattern and bilat rail.   In water >4 ft deep unsupported:    * Warm up of forward, backward gait and side stepping  * Holding wall:squats,  Progressed to ue supported with yellow hand buoys x10-12ea  *SL squats R/L 3.6 ft holding to wall x10  *adductor sets using BB 8x 5-10s hold  *attempted STS from 3rd step.  Pt has right hip pain. Discontinued *SLS R/L ue supportd on yellow hand buoys then decreased ue support to 1 foam hand buoy. Holding R/L in 4.39f x>20s *hip flexor stretch, alternating legs - holding for 5-10sec each; weight shifts L/R * plank with hip ext with hands on bench in water  x10 *Seated 4th step: cycling; add/abd; flutter 3x20 *lumbar stretch leaning against wall x 3 10s hold, hands on kick board.   Pt requires the buoyancy and hydrostatic pressure of water for support, and to offload joints by unweighting joint load by at least 50 % in navel deep water and by at least 75-80% in chest to neck deep water.  Viscosity of the water is needed for resistance of strengthening. Water current perturbations provides challenge  to standing balance requiring increased core activation.   08/14/22: Pt seen for aquatic therapy today.  Treatment took place in water 3.25-4.5 ft in depth at the [facility]. Temp of water was 91.  Pt entered/exited the pool via stairs independently with step to pattern and bilat rail.   In water >4 ft deep unsupported:    * Warm up of forward, backward gait and side stepping  * Holding wall:squats,  Progressed to ue supported with yellow han buoys x10-12ea  *SLS R/L ue supported on yellow hand buoys for hip flexor stretch, alternating legs - holding for 5-10sec each; weight shifts L/R * Seated on bench in water with blue ankle buoys donned: alternating LAQ; add/abd; flutter kick 2 x 20 * plank with hip ext with hands on bench in water x10  Pt requires the buoyancy and hydrostatic pressure of water for support, and to offload joints by unweighting joint load by at least 50 % in navel deep water and by at least 75-80% in chest to neck deep water.  Viscosity of the water is needed for resistance of strengthening. Water current perturbations provides challenge to standing balance requiring increased core activation.  Treatment                            8/7: Nu step 5 min L6 UE & LE Attempted 5TSTS- unable to perform 1 stand, used hands to complete today MANUAL: lateral distraction of FA joint in hooklying, roller to quads, STM to pectineus & iliopsoas    08/02/22: Nu step 5 min L6 Ue & LE MANUAL: roller to glut med/min/TFL, quads Stnaind hip abd with slgiht flexion at bar Seated trunk flexion with physioball Seated LAQ ball bw knees Seated clam- yellow tband- focus on coordinated movement  Combo of abd yellow tband + LAQ Sidelying clams- PT blocked roll back but did not assist in hip ER Sidelying hip abd- PT assisted 4x5      PATIENT EDUCATION:  Education details: Anatomy of condition, POC, HEP, exercise form/rationale  Person educated: Patient and Spouse Education  method: Explanation, Demonstration, Tactile cues, and Verbal cues Education comprehension: verbalized understanding, returned demonstration, verbal cues required, tactile cues required, and needs further education     HOME EXERCISE PROGRAM: [id number]   ASSESSMENT:   CLINICAL IMPRESSION: Pt arrives without c/o pain. Progressed exercises in 3 ft with quad activation and CKC exercises.  He does have 1 episode of jabbing right hip pain which subsides with walking in deep water.  Does not cause any more issue throughout session.  He continues to progress with skilled physcial therapy with overall decrease in pain last few weeks.  Pt pleased with progress.   Focus of aquatic session on reduction of pain, normalization of gait and maintenance of hip/knee/ankle ROM.  Pt reports he is pain free while submerged to 4.5 ft of water.  Will continued to await imaging from [place] the FA joint and pelvis to check for pathological fracture. No  new goals met since flare up of symptoms.   OBJECTIVE IMPAIRMENTS Abnormal gait, decreased activity tolerance, decreased balance, difficulty walking, decreased strength, impaired sensation, improper body mechanics, postural dysfunction, and pain.    ACTIVITY LIMITATIONS cleaning, community activity, driving, meal prep, laundry, and shopping.    PERSONAL FACTORS  see problem list  are also affecting patient's functional outcome.      REHAB POTENTIAL: Good   CLINICAL DECISION MAKING: Unstable/unpredictable   EVALUATION COMPLEXITY: High     GOALS: Goals reviewed with patient? Yes   SHORT TERM GOALS:   Able to enter/exit pool independently with use of hand rails Baseline: will begin training Target date: 04/03/2022 Goal status: Achieved    2.  Good tolerance to exercise without limitations in next day due to pain Baseline: Target date: 04/03/2022 Goal status: achieved    3.  Able to shift weight to perform step taps with UE assist Baseline: unable at  eval Target date: 04/03/2022 Goal status: Achieved    4.  Able to ambulate with centered weight distribution Baseline: able with [facility] Target date: 04/03/2022 Goal status: achieved       LONG TERM GOALS:   BERG to improve by MDC of 6 points Baseline: 33 at eval Target date:  07/06/22 Goal status: achieved   2.  Pt will ambulate safely around his home wihtout use of AD Baseline: achieved Goal status: ongoing   3.  Able to navigate stairs in home safely and confidently Baseline:navigating stairs with "up with the good, down with the bad" pattern  Goal status: ongoing   4.  LEFS to improve by MDC Baseline: MDC is 9 points  Goal status: INITIAL   5.  Pt will demo 5TSTS in 20s or less Baseline: 38s at eval  Goal status: Achieved - 04/19/22   6.  Pt will be able to rid home of aid devices Baseline: taken toilet seats out, uses stool in shower but for convenience, not need; still using walker to carry things  Goal status: partially met  7. Start walking around the neighborhood, ultimately without AD  Baseline: walking but with cane.     Goal status: ongoing   PLAN: PT FREQUENCY: 1-2x/week   PT DURATION: other: 4 months   PLANNED INTERVENTIONS: Therapeutic exercises, Therapeutic activity, Neuromuscular re-education, Balance training, Gait training, Patient/Family education, Joint mobilization, Stair training, Aquatic Therapy, Electrical stimulation, Cryotherapy, Moist heat, Taping, and Manual therapy   PLAN FOR NEXT SESSION: Rt quad activation, continue CKC exercises focusing on midline weight distribution/shifting Rt, new imaging from [place] appt on 8/8?  Carleta Woodrow Tharon Aquas) [place] MPT

## 2022-08-27 ENCOUNTER — Telehealth: Payer: Self-pay | Admitting: Family Medicine

## 2022-08-27 ENCOUNTER — Telehealth: Payer: Self-pay | Admitting: Pharmacist
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
## 2022-08-27 NOTE — Telephone Encounter (Signed)
FYI

## 2022-08-27 NOTE — Progress Notes (Signed)
Chronic Care Management Pharmacy Assistant   Name: Bruce Ball  MRN: [id number] DOB: [birth year]   Reason for Encounter: Hypertension Adherence Call    Recent office visits:  None  Recent consult visits:  None  Hospital visits:  None in previous 6 months  Medications: Outpatient Encounter Medications as of 08/27/2022  Medication Sig   acetaminophen (TYLENOL) 325 MG tablet Take 2 tablets (650 mg total) by mouth every 6 (six) hours as needed for mild pain or moderate pain.   apixaban (ELIQUIS) 2.5 MG TABS tablet Take 1 tablet (2.5 mg total) by mouth 2 (two) times daily.   atorvastatin (LIPITOR) 20 MG tablet Take 1 tablet (20 mg total) by mouth at bedtime.   Calcium Carb-Cholecalciferol 600-10 MG-MCG TABS Take 1 tablet by mouth 2 (two) times daily.   carvedilol (COREG) 25 MG tablet Take 12.5 mg by mouth 2 (two) times daily.   Cholecalciferol (VITAMIN D3) 50 MCG (2000 UT) capsule Take 2,000 Units by mouth in the morning and at bedtime.    DULoxetine (CYMBALTA) 30 MG capsule Take 30 mg by mouth daily.   ferrous sulfate 325 (65 FE) MG tablet Take 325 mg by mouth every Monday, Wednesday, and Friday.   fluticasone (FLONASE) 50 MCG/ACT nasal spray Place 1-2 sprays into both nostrils daily as needed for allergies or rhinitis.   gabapentin (NEURONTIN) 300 MG capsule Take 300 mg by mouth at bedtime.   methocarbamol (ROBAXIN) 500 MG tablet Take 1 tablet by mouth at bedtime as needed for muscle spasms.   metoCLOPramide (REGLAN) 5 MG tablet TAKE 1 TABLET (5 MG TOTAL) BY MOUTH 3 (THREE) TIMES DAILY BEFORE MEALS.   Multiple Vitamins-Minerals (MULTIVITAMIN WITH MINERALS) tablet Take 1 tablet by mouth daily.   pantoprazole (PROTONIX) 40 MG tablet Take 40 mg by mouth 2 (two) times daily before a meal.   traMADol (ULTRAM) 50 MG tablet Take 50 mg by mouth as needed.   No facility-administered encounter medications on file as of 08/27/2022.   Reviewed chart prior to disease state call. Spoke  with patient regarding BP  Recent Office Vitals: BP Readings from Last 3 Encounters:  04/22/22 127/73  03/07/22 (!) 146/80  01/17/22 124/70   Pulse Readings from Last 3 Encounters:  04/22/22 65  03/07/22 (!) 56  01/17/22 90    Wt Readings from Last 3 Encounters:  04/22/22 231 lb 2 oz (104.8 kg)  04/03/22 238 lb (108 kg)  03/07/22 233 lb 12.8 oz (106.1 kg)     Kidney Function Lab Results  Component Value Date/Time   CREATININE 1.35 (H) 12/23/2021 11:21 AM   CREATININE 1.29 12/04/2021 04:08 PM   CREATININE 1.67 (H) 01/25/2021 11:25 AM   CREATININE 1.42 (H) 07/25/2020 11:54 AM   GFR 54.25 (L) 12/04/2021 04:08 PM   GFRNONAA 55 (L) 12/23/2021 11:21 AM   GFRNONAA 43 (L) 01/25/2021 11:25 AM   GFRAA 56 (L) 07/25/2020 11:54 AM       Latest Ref Rng & Units 12/23/2021   11:21 AM 12/04/2021    4:08 PM 10/12/2021    7:34 AM  BMP  Glucose 70 - 99 mg/dL 100  87  121   BUN 8 - 23 mg/dL '23  19  13   '$ Creatinine 0.61 - 1.24 mg/dL 1.35  1.29  1.25   Sodium 135 - 145 mmol/L 141  141  138   Potassium 3.5 - 5.1 mmol/L 3.5  4.8  3.9   Chloride 98 - 111 mmol/L 104  107  107   CO2 22 - 32 mmol/L '28  30  23   '$ Calcium 8.9 - 10.3 mg/dL 9.3  8.9  8.1     Current antihypertensive regimen:  Carvedilol 25 mg twice daily  How often are you checking your Blood Pressure? infrequently  Current home BP readings: 125/70 - patient states he is currently on vacation in [place]. He said his daughter has been checking his blood pressure recently.  What recent interventions/DTPs have been made by any provider to improve Blood Pressure control since last CPP Visit: No recent interventions or DTPs.  Any recent hospitalizations or ED visits since last visit with CPP? No  What diet changes have been made to improve Blood Pressure Control?  Patient states he has been eating a well balanced diet.  What exercise is being done to improve your Blood Pressure Control?  Patient states he has been getting  around well and is more active. He also states he is still doing PT.  Adherence Review: Is the patient currently on ACE/ARB medication? No Does the patient have >5 day gap between last estimated fill dates? No   Care Gaps: Medicare Annual Wellness: Completed 01/25/2022 Hemoglobin A1C: 5.5% on 06/05/2021 Colonoscopy: Completed 09/26/2021  Future Appointments  Date Time Provider [place]  08/28/2022  9:00 AM Vedia Pereyra, PT [facility]  08/30/2022 11:45 AM Selinda Eon, PT [facility]  09/04/2022 11:15 AM Carlson-Long, Stephani [facility]  09/06/2022 11:45 AM Selinda Eon, PT [facility]  09/13/2022 11:45 AM Selinda Eon, PT [facility]  09/18/2022 11:15 AM Carlson-Long, Stephani [employer] [facility]  09/20/2022 11:45 AM Selinda Eon, PT [facility]  09/25/2022 11:15 AM Carlson-Long, [facility] [employer] [facility]  09/27/2022 11:45 AM Selinda Eon, PT [facility]  10/02/2022 11:15 AM Carlson-Long, [facility]  10/04/2022 11:45 AM Selinda Eon, PT [facility]  10/09/2022 11:15 AM Carlson-Long, [facility]  10/11/2022 11:45 AM Selinda Eon, PT [facility]  10/16/2022 11:15 AM Carlson-Long, [facility]  10/18/2022 11:45 AM Selinda Eon, PT [facility]  10/23/2022 11:15 AM Carlson-Long, Stephani [facility]  10/25/2022 11:45 AM Selinda Eon, PT [facility]  01/31/2023 11:45 AM [facility] HEALTH COACH [facility]  05/06/2023  3:00 PM [facility] CCM PHARMACIST [facility]   Star Rating Drugs: Atorvastatin - no fill date available.  April D Calhoun, [place] Pharmacist Assistant [phone]

## 2022-08-27 NOTE — Telephone Encounter (Signed)
Check with Jeanett Schlein or Tammy if we are allowed to do virtual visits to [place]- he has potential med interactions/etc so would be best to have a visit so counseling can be provided

## 2022-08-28 ENCOUNTER — Ambulatory Visit (HOSPITAL_BASED_OUTPATIENT_CLINIC_OR_DEPARTMENT_OTHER): Payer: Medicare Other | Admitting: Physical Therapy

## 2022-08-28 DIAGNOSIS — R55 Syncope and collapse: Secondary | ICD-10-CM | POA: Diagnosis not present

## 2022-08-28 DIAGNOSIS — Z789 Other specified health status: Secondary | ICD-10-CM | POA: Diagnosis not present

## 2022-08-28 NOTE — Telephone Encounter (Addendum)
We are not able to. I have advised patient's spouse of this when she called in, recommending that they seek treatment in [place].   I have given spouse a call.  Patient was able to seek treatment and get a rx for paxlovid in [place], yesterday.

## 2022-08-28 NOTE — Telephone Encounter (Signed)
FYI, pt got treated in [place].

## 2022-08-28 NOTE — Telephone Encounter (Signed)
See below

## 2022-08-30 ENCOUNTER — Encounter (HOSPITAL_BASED_OUTPATIENT_CLINIC_OR_DEPARTMENT_OTHER): Payer: Medicare Other | Admitting: Physical Therapy

## 2022-09-04 ENCOUNTER — Ambulatory Visit (HOSPITAL_BASED_OUTPATIENT_CLINIC_OR_DEPARTMENT_OTHER): Payer: Medicare Other | Admitting: Physical Therapy

## 2022-09-04 DIAGNOSIS — I2699 Other pulmonary embolism without acute cor pulmonale: Secondary | ICD-10-CM | POA: Diagnosis not present

## 2022-09-05 DIAGNOSIS — I2699 Other pulmonary embolism without acute cor pulmonale: Secondary | ICD-10-CM | POA: Diagnosis not present

## 2022-09-06 ENCOUNTER — Encounter (HOSPITAL_BASED_OUTPATIENT_CLINIC_OR_DEPARTMENT_OTHER): Payer: Medicare Other | Admitting: Physical Therapy

## 2022-09-13 ENCOUNTER — Encounter (HOSPITAL_BASED_OUTPATIENT_CLINIC_OR_DEPARTMENT_OTHER): Payer: Self-pay | Admitting: Physical Therapy

## 2022-09-13 ENCOUNTER — Ambulatory Visit (HOSPITAL_BASED_OUTPATIENT_CLINIC_OR_DEPARTMENT_OTHER): Payer: Medicare Other | Attending: Family Medicine | Admitting: Physical Therapy

## 2022-09-13 DIAGNOSIS — I77810 Thoracic aortic ectasia: Secondary | ICD-10-CM | POA: Diagnosis not present

## 2022-09-13 DIAGNOSIS — M25551 Pain in right hip: Secondary | ICD-10-CM | POA: Insufficient documentation

## 2022-09-13 DIAGNOSIS — I2510 Atherosclerotic heart disease of native coronary artery without angina pectoris: Secondary | ICD-10-CM | POA: Diagnosis not present

## 2022-09-13 DIAGNOSIS — I10 Essential (primary) hypertension: Secondary | ICD-10-CM | POA: Insufficient documentation

## 2022-09-13 DIAGNOSIS — M6281 Muscle weakness (generalized): Secondary | ICD-10-CM | POA: Insufficient documentation

## 2022-09-13 DIAGNOSIS — I5032 Chronic diastolic (congestive) heart failure: Secondary | ICD-10-CM | POA: Insufficient documentation

## 2022-09-13 DIAGNOSIS — R0609 Other forms of dyspnea: Secondary | ICD-10-CM | POA: Insufficient documentation

## 2022-09-13 DIAGNOSIS — R262 Difficulty in walking, not elsewhere classified: Secondary | ICD-10-CM | POA: Diagnosis not present

## 2022-09-13 DIAGNOSIS — M79604 Pain in right leg: Secondary | ICD-10-CM | POA: Insufficient documentation

## 2022-09-13 DIAGNOSIS — I2584 Coronary atherosclerosis due to calcified coronary lesion: Secondary | ICD-10-CM | POA: Insufficient documentation

## 2022-09-13 NOTE — Therapy (Signed)
OUTPATIENT PHYSICAL THERAPY TREATMENT NOTE     Patient Name: Bruce Ball MRN: [id number] DOB:[birth year], 76 y.o., male Today's Date: 09/13/2022  PCP: [name], MD REFERRING PROVIDER: [name], MD   PT End of Session - 09/13/22 1146     Visit Number 40    Number of Visits 31    Date for PT Re-Evaluation 10/25/22    Authorization Type MCR    Progress Note Due on Visit 76    PT Start Time 3343    PT Stop Time 1226    PT Time Calculation (min) 41 min    Activity Tolerance Patient limited by pain    Behavior During Therapy [facility] for tasks assessed/performed                   Past Medical History:  Diagnosis Date   Ankylosing spondylitis ([place])    Arthritis of right knee    Basal cell carcinoma    Chronic back pain greater than 3 months duration    Chronic leg pain    CKD (chronic kidney disease), stage II    Coronary artery calcification seen on CAT scan 03/27/2021   Deafness in right ear    Dyspnea    cause by PE   Essential tremor    Extrinsic asthma, unspecified 04/07/2009   PT DENIES   GERD (gastroesophageal reflux disease)    Hearing loss    Hip problem    History of kidney stones    HYPERTENSION 10/29/2007   HYPOTENSION 08/13/2010   LOW BACK PAIN 04/21/2008   Mild aortic stenosis    NEPHROLITHIASIS, HX OF 04/21/2008   Neuropathy    Osteopenia    Other hyperlipidemia    Peptic ulcer    Pneumonia    PUD, HX OF 04/21/2008   Pulmonary emboli ([place]) 2021   noted on CT 12-2019   Renal disorder    Stage 3 chronic kidney disease (HCC)    Thyroid nodule    Past Surgical History:  Procedure Laterality Date   BACK SURGERY     bone cancer  1970   rt femur removed and steel rod placed   CYSTOSCOPY WITH RETROGRADE PYELOGRAM, URETEROSCOPY AND STENT PLACEMENT Right 11/06/2017   Procedure: CYSTOSCOPY WITH  STENT PLACEMENT RIGHT;  Surgeon: Raynelle Bring, MD;  Location: [facility];  Service: Urology;  Laterality: Right;   CYSTOSCOPY WITH RETROGRADE  PYELOGRAM, URETEROSCOPY AND STENT PLACEMENT Left 06/09/2019   Procedure: CYSTOSCOPY WITH RETROGRADE PYELOGRAM, URETEROSCOPY AND STENT PLACEMENT X2;  Surgeon: [name], MD;  Location: [facility];  Service: Urology;  Laterality: Left;   CYSTOSCOPY WITH RETROGRADE PYELOGRAM, URETEROSCOPY AND STENT PLACEMENT Bilateral 03/01/2020   Procedure: CYSTOSCOPY WITH RETROGRADE PYELOGRAM, URETEROSCOPY AND STENT PLACEMENT;  Surgeon: [name], MD;  Location: [facility];  Service: Urology;  Laterality: Bilateral;  75 MINS   CYSTOSCOPY/RETROGRADE/URETEROSCOPY     1 year ago at [place] in [place] Right 11/24/2017   Procedure: CYSTOSCOPY/URETEROSCOPY/ RETROGRADE/STENT REMOVAL;  Surgeon: Raynelle Bring, MD;  Location: [facility];  Service: Urology;  Laterality: Right;   HOLMIUM LASER APPLICATION Bilateral 05/05/8615   Procedure: HOLMIUM LASER APPLICATION;  Surgeon: [name], MD;  Location: [facility];  Service: Urology;  Laterality: Bilateral;   prosatectomy     Patient Active Problem List   Diagnosis Date Noted   History of pulmonary embolism 10/10/2021   HTN (hypertension) 10/10/2021   Chronic diastolic CHF (congestive heart failure) ([place]) 10/10/2021   GERD (gastroesophageal  reflux disease) 10/10/2021   Ankylosing spondylitis ([place]) 10/10/2021   CKD (chronic kidney disease), stage III ([place]) 10/10/2021   Hyperlipidemia 10/10/2021   CAD (coronary artery disease) 10/10/2021   Periprosthetic fracture of shaft of femur 10/10/2021   Other fatigue 06/05/2021   CAD (coronary artery disease) 29/51/8841   Diastolic CHF ([place]) 66/05/3015   SOB (shortness of breath) on exertion 03/26/2021   Chronic anticoagulation 03/26/2021   Anemia in chronic kidney disease 01/25/2021   Acquired coagulation disorder ([place]) 12/12/2020   Senile purpura ([place]) 12/12/2020   S/P lumbar fusion 08/03/2020   Thin skin 07/25/2020   Aortic atherosclerosis ([place]) 05/17/2020   B12 deficiency 05/17/2020    Iliac artery aneurysm ([place]) 05/17/2020   Osteoporosis 04/04/2020   Idiopathic neuropathy 01/28/2020   Recurrent falls 01/28/2020   Chronic kidney disease (CKD), stage III (moderate) ([place]) 01/19/2020   Pulmonary emboli ([place]) 01/14/2020   GERD (gastroesophageal reflux disease) 12/22/2019   Essential tremor 12/22/2019   Femoral condyle fracture ([place]) 07/15/2019   Gross hematuria 06/14/2019   Hyperlipidemia 11/11/2017   Kidney stone 11/05/2017   Community acquired pneumonia of right upper lobe of lung 11/05/2017   Right ureteral stone 11/05/2017   History of total right hip replacement 03/08/2017   History of prostate cancer 03/08/2017   Personal history of prostate cancer 02/28/2017   High risk medication use 02/28/2017   DDD (degenerative disc disease), thoracic 02/28/2017   Ataxia 05/18/2016   Vestibular disequilibrium 05/18/2016   Ankylosing spondylitis of cervical region ([place]) 05/24/2014   Lung nodule, solitary 05/24/2014   Lumbosacral stenosis 06/05/2013   Peripheral edema 06/08/2012   Subcortical microvascular ischemic occlusive disease 08/13/2010   Extrinsic asthma 04/07/2009   Backache 04/21/2008   History of peptic ulcer disease 04/21/2008   Essential hypertension 10/29/2007  REFERRING PROVIDER: [name], MD   REFERRING DIAG:  M54.50,G89.29 (ICD-10-CM) - Chronic bilateral low back pain without sciatica  R26.89 (ICD-10-CM) - Other abnormalities of gait and mobility      THERAPY DIAG:  Pain in right hip   Pain in right leg   Difficulty in walking, not elsewhere classified   Muscle weakness (generalized)   ONSET DATE: October 10, 2021 recent fall causing fracture  SUBJECTIVE:                                                                                                                                  SUBJECTIVE STATEMENT: My wife and I caught COVID, fell at my daughters house, had to go to hotel and fell there. Second time I fell at hotel EMS came and I  was in hospital for a couple of days. Hip is okay.     PERTINENT HISTORY:  Stenosis, Rt THA 54 yr ago with h/o periprosthetic fx & revision in 2022, h/o back surgery  Hard of hearing; can hear out of Left ear   PAIN:  Are you having pain: Yes 6/10 Pain  location: Rt/Lt groin  Description: ache/sharp Aggravating factors: Rt WB, walking Relieving factors: rest   PRECAUTIONS: Posterior hip   WEIGHT BEARING RESTRICTIONS No   FALLS:  Has patient fallen in last 6 months? Yes, Number of falls: 1 4/21: no falls recently 6/9: denies new falls 7/7: denies falls 8/7: denies falls 9/15: 3 falls     PATIENT GOALS build up strength, get rid of assistance for toileting and getting in/out of bed, no AD in gait, stairs, Son's wedding in [place] first of June     OBJECTIVE: *All objective findings taken at [facility] unless otherwise noted.    PATIENT SURVEYS:  LEFS 39 4/21: 46 5/19: 43 6/9: 43 7/7: 43  0 on walking 2 blocks, 1 mile & stiars- would like to see these improve in particular 8/7: 42  Still 0/4 on prev stated items 9/15 unchanged from 8/7     COGNITION:          Overall cognitive status: Within functional limits for tasks assessed                        SENSATION:          Bil hand/feet neuropathy unchanged since evaluation      LE MMT:  6/9 measured with handheld dynamometry Normative values: hip flexion 38 lb, knee extension 79 lb   MMT Right 03/06/2022 Right/Left 04/19/22 Rt/Lt 05/17/22 Rt/Lt 06/07/22 Rt//Lt 07/05/22 Rt//Lt 07/26/22 Rt//Lt 9/15  Hip flexion 4/5 11.1/ 24.3 20.9/42.6 34.8/44.2 38.9//42.1  20.1//37.3  Knee flexion 3/5 32.3/37.9 37.7/55.3 38.7/48.6 42.2//54.9    Knee extension 3/5 12.1/42.4 20.7/60.7 25.8/50.5 24.9//50.6 28.2//66.2 22.9//43.5   (Blank rows = not tested)  6/23:unable to lift RtLE against gravity in sidelying  8/7: able to lift knee in clam independently but requires assistance for sidelying straight leg hip abduction       FUNCTIONAL  TESTS:  5 times sit to stand: 38s 4/21: 18s without use of UEs 6/9:16s without use of UEs 8/7: 14s with UEs, too much groin pain without UEs 9/15: 20s with UE for last 4- balance felt off more than groin pain Berg Balance Scale: 33/56 4/21: 40/50 6/9: 44/50    GAIT:   Comments: SPC in Lt hand, rt leg abducted and externally rotated, antalgic on Rt with pressure placed through cane 4/21: SPC in Lt hand, Rt leg no longer abd /ER; does demo Rt trunk SB in Rt stance phase 6/9: SPC in Lt hand, mild Rt trunk SB in Rt stance phase, maintains upright posture 7/7: with SPC: maint upright posture wihtout Rt sidebend in stance phase; without SPC- cont upright posture and good swing through but does present sidebend to the Rt in Rt stance phase 8/7: with SPC, upright mosture with minimal deviation from midline and minimal use of cane for assistance, antalgic without cane and flexes trunk rather than rotates.  9/15:notbale  maintenance of midline through trunk with ER and abd of LLE         TODAY'S TREATMENT   Mini lunges at bar- cues to avoid knee passing ankle Side stepping at bar LAQ 4lb 2x10  Also performed with yellow tband around knees Hip flexion +abd in seated, 4 lb & yellow tband around knees Stand and reach for cone Sidelying clam 3*10, no assist required Sidelyiing hp abd x10, assist required      PATIENT EDUCATION:  Education details: Anatomy of condition, POC, HEP, exercise form/rationale  Person educated: Patient and Spouse Education  method: Explanation, Demonstration, Tactile cues, and Verbal cues Education comprehension: verbalized understanding, returned demonstration, verbal cues required, tactile cues required, and needs further education     HOME EXERCISE PROGRAM: [id number]   ASSESSMENT:   CLINICAL IMPRESSION: Pt demo slight decrease in strength measurements as would be expected following health issues in the last couple of weeks. He has maintained an improved  gait pattern and upright posture. Continues to have severe and limiting pain that presents throughout the session depending on movements and he says it is in the area of the bursa. Will do 10 more sessions of PT and then plan to transition to community workout program.   OBJECTIVE IMPAIRMENTS Abnormal gait, decreased activity tolerance, decreased balance, difficulty walking, decreased strength, impaired sensation, improper body mechanics, postural dysfunction, and pain.    ACTIVITY LIMITATIONS cleaning, community activity, driving, meal prep, laundry, and shopping.    PERSONAL FACTORS  see problem list  are also affecting patient's functional outcome.      REHAB POTENTIAL: Good   CLINICAL DECISION MAKING: Unstable/unpredictable   EVALUATION COMPLEXITY: High     GOALS: Goals reviewed with patient? Yes   SHORT TERM GOALS:   Able to enter/exit pool independently with use of hand rails Baseline: will begin training Target date: 04/03/2022 Goal status: Achieved    2.  Good tolerance to exercise without limitations in next day due to pain Baseline: Target date: 04/03/2022 Goal status: achieved    3.  Able to shift weight to perform step taps with UE assist Baseline: unable at eval Target date: 04/03/2022 Goal status: Achieved    4.  Able to ambulate with centered weight distribution Baseline: able with [facility] Target date: 04/03/2022 Goal status: achieved       LONG TERM GOALS:   BERG to improve by MDC of 6 points Baseline: 33 at eval Target date:  07/06/22 Goal status: achieved   2.  Pt will ambulate safely around his home wihtout use of AD Baseline: achieved Goal status: ongoing   3.  Able to navigate stairs in home safely and confidently Baseline:navigating stairs with "up with the good, down with the bad" pattern  Goal status: ongoing   4.  LEFS to improve by MDC Baseline: MDC is 9 points  Goal status: INITIAL   5.  Pt will demo 5TSTS in 20s or less Baseline: 38s at  eval  Goal status: Achieved - 04/19/22   6.  Pt will be able to rid home of aid devices Baseline: taken toilet seats out, uses stool in shower but for convenience, not need; still using walker to carry things  Goal status: partially met  7. Start walking around the neighborhood, ultimately without AD  Baseline: walking but with cane.     Goal status: ongoing   PLAN: PT FREQUENCY: 1-2x/week   PT DURATION: other: 4 months   PLANNED INTERVENTIONS: Therapeutic exercises, Therapeutic activity, Neuromuscular re-education, Balance training, Gait training, Patient/Family education, Joint mobilization, Stair training, Aquatic Therapy, Electrical stimulation, Cryotherapy, Moist heat, Taping, and Manual therapy   PLAN FOR NEXT SESSION: Rt quad activation, continue CKC exercises focusing on midline weight distribution/shifting Rt  Hanae Waiters C. Asier Desroches PT, DPT 09/13/22 2:42 PM

## 2022-09-16 ENCOUNTER — Encounter: Payer: Self-pay | Admitting: Physician Assistant

## 2022-09-16 ENCOUNTER — Ambulatory Visit (INDEPENDENT_AMBULATORY_CARE_PROVIDER_SITE_OTHER)
Admission: RE | Admit: 2022-09-16 | Discharge: 2022-09-16 | Disposition: A | Discharge status: Home / Self Care | Payer: Medicare Other | Source: Ambulatory Visit | Attending: Internal Medicine | Admitting: Internal Medicine

## 2022-09-16 ENCOUNTER — Encounter: Payer: Self-pay | Admitting: Internal Medicine

## 2022-09-16 ENCOUNTER — Ambulatory Visit (INDEPENDENT_AMBULATORY_CARE_PROVIDER_SITE_OTHER): Payer: Medicare Other | Admitting: Internal Medicine

## 2022-09-16 VITALS — BP 136/89 | HR 66 | Temp 98.1°F | Resp 14 | Ht 71.0 in | Wt 236.4 lb

## 2022-09-16 DIAGNOSIS — H04123 Dry eye syndrome of bilateral lacrimal glands: Secondary | ICD-10-CM | POA: Insufficient documentation

## 2022-09-16 DIAGNOSIS — R42 Dizziness and giddiness: Secondary | ICD-10-CM

## 2022-09-16 DIAGNOSIS — R0609 Other forms of dyspnea: Secondary | ICD-10-CM

## 2022-09-16 DIAGNOSIS — I499 Cardiac arrhythmia, unspecified: Secondary | ICD-10-CM

## 2022-09-16 DIAGNOSIS — Z86718 Personal history of other venous thrombosis and embolism: Secondary | ICD-10-CM | POA: Insufficient documentation

## 2022-09-16 DIAGNOSIS — G8929 Other chronic pain: Secondary | ICD-10-CM | POA: Insufficient documentation

## 2022-09-16 DIAGNOSIS — R0989 Other specified symptoms and signs involving the circulatory and respiratory systems: Secondary | ICD-10-CM

## 2022-09-16 DIAGNOSIS — C4441 Basal cell carcinoma of skin of scalp and neck: Secondary | ICD-10-CM | POA: Insufficient documentation

## 2022-09-16 DIAGNOSIS — M79641 Pain in right hand: Secondary | ICD-10-CM | POA: Insufficient documentation

## 2022-09-16 DIAGNOSIS — K036 Deposits [accretions] on teeth: Secondary | ICD-10-CM | POA: Insufficient documentation

## 2022-09-16 DIAGNOSIS — I509 Heart failure, unspecified: Secondary | ICD-10-CM | POA: Diagnosis not present

## 2022-09-16 DIAGNOSIS — U099 Post covid-19 condition, unspecified: Secondary | ICD-10-CM | POA: Insufficient documentation

## 2022-09-16 DIAGNOSIS — Z461 Encounter for fitting and adjustment of hearing aid: Secondary | ICD-10-CM | POA: Insufficient documentation

## 2022-09-16 DIAGNOSIS — K0402 Irreversible pulpitis: Secondary | ICD-10-CM | POA: Insufficient documentation

## 2022-09-16 DIAGNOSIS — I5032 Chronic diastolic (congestive) heart failure: Secondary | ICD-10-CM

## 2022-09-16 DIAGNOSIS — Z710 Person encountering health services to consult on behalf of another person: Secondary | ICD-10-CM | POA: Insufficient documentation

## 2022-09-16 DIAGNOSIS — Z760 Encounter for issue of repeat prescription: Secondary | ICD-10-CM | POA: Insufficient documentation

## 2022-09-16 DIAGNOSIS — E041 Nontoxic single thyroid nodule: Secondary | ICD-10-CM | POA: Insufficient documentation

## 2022-09-16 DIAGNOSIS — R0600 Dyspnea, unspecified: Secondary | ICD-10-CM | POA: Diagnosis not present

## 2022-09-16 DIAGNOSIS — I10 Essential (primary) hypertension: Secondary | ICD-10-CM | POA: Insufficient documentation

## 2022-09-16 DIAGNOSIS — H903 Sensorineural hearing loss, bilateral: Secondary | ICD-10-CM | POA: Insufficient documentation

## 2022-09-16 LAB — COMPREHENSIVE METABOLIC PANEL
ALT: 12 U/L (ref 0–53)
AST: 16 U/L (ref 0–37)
Albumin: 3.4 g/dL — ABNORMAL LOW (ref 3.5–5.2)
Alkaline Phosphatase: 64 U/L (ref 39–117)
BUN: 20 mg/dL (ref 6–23)
CO2: 27 mEq/L (ref 19–32)
Calcium: 9 mg/dL (ref 8.4–10.5)
Chloride: 107 mEq/L (ref 96–112)
Creatinine, Ser: 1.5 mg/dL (ref 0.40–1.50)
GFR: 45.02 mL/min — ABNORMAL LOW (ref >60.00)
Glucose, Bld: 80 mg/dL (ref 70–99)
Potassium: 4.2 mEq/L (ref 3.5–5.1)
Sodium: 142 mEq/L (ref 135–145)
Total Bilirubin: 1 mg/dL (ref 0.2–1.2)
Total Protein: 6.3 g/dL (ref 6.0–8.3)

## 2022-09-16 LAB — CBC
HCT: 43.4 % (ref 39.0–52.0)
Hemoglobin: 14.4 g/dL (ref 13.0–17.0)
MCHC: 33.2 g/dL (ref 30.0–36.0)
MCV: 86.8 fl (ref 78.0–100.0)
Platelets: 230 10*3/uL (ref 150.0–400.0)
RBC: 5 Mil/uL (ref 4.22–5.81)
RDW: 14 % (ref 11.5–15.5)
WBC: 6 10*3/uL (ref 4.0–10.5)

## 2022-09-16 LAB — BRAIN NATRIURETIC PEPTIDE: Pro B Natriuretic peptide (BNP): 81 pg/mL (ref 0.0–100.0)

## 2022-09-16 LAB — VITAMIN B12: Vitamin B-12: 295 pg/mL (ref 211–911)

## 2022-09-16 NOTE — Progress Notes (Signed)
[place] at [employer]:  [phone]   Routine Medical Office Visit  Patient:  Bruce Ball      Age: 76 y.o.       Sex:  male  Date:   09/16/2022  PCP:    [name], MD    [place] Provider: Loralee Pacas, MD  Assessment/Plan:   Vint was seen today for post covid symptoms  I reassured him that I think these  symptoms are common complications of severe COVID that should slowly improve over time on their own even if we do not do anything more.  I encouraged him to check oxygen at home with one of the oximeters since I think the cardiovascular depression is causing poor o2.  He report history of CHF and that he already has lasix and he will try taking more of that.  I will get CXR and have him check in with cardiology, and check BNP and labs to help guide that.  He feels just awful but has no difficulty breathing at rest and vitals ok so I don't think any need for hospitalization at this time  Post-COVID syndrome Overview: Diagnosed COVID  08/26/22 and was hospitalized for 3-4 days after recurrent falls in [place] (no records yet), Persistent dizzy, ha, cough (included blood in hospital but now just liquidy), doe, worse memory, malaise (generally feels bad) Denies chest pain or palpitations but has chest congestion Fevers have stopped for over a week Denies muscle aches  Assessment & Plan: D/T irreg heart beat, doe with cough and rales- suspect myocarditis and obtained ekg and referring to cardiology Suspect memory/doe/malaise/dizzy are all r/w poor oxygenation status secondary to poor heart and lung function in the wake of covid. He follows with Fransico Him for cardiology and [name] as PCP will cc note to them; I encourage soon follow up with both especially Dr. Radford Pax. Request he complete release of information  Orders: -     EKG 12-Lead -     DG Chest 2 View; Future  Dyspnea on exertion -     EKG 12-Lead -     DG Chest 2 View;  Future  Bilateral rales -     EKG 12-Lead -     DG Chest 2 View; Future  Irregular heartbeat -     EKG 12-Lead -     DG Chest 2 View; Future  Return if symptoms worsen or fail to improve.    Patient is instructed to call or message via MyChart if he has any questions or concerns regarding our treatment plan.  Severe Hearing Loss was recognized as a  barriers to understanding so a detailed AVS was provided  We discussed Red Flag symptoms and signs in detail. He expressed understanding regarding what to do in case of urgent or emergency type symptoms.  He was advised to call the office or go to ER if his condition worsens. Additional information was provided in the AVS (see AVS) regarding the diagnosis/treatment plan for him to review    Subjective:   Bruce Ball is a 76 y.o. male with PMH significant for: Past Medical History:  Diagnosis Date   Ankylosing spondylitis ([place])    Arthritis of right knee    Basal cell carcinoma    Chronic back pain greater than 3 months duration    Chronic leg pain    CKD (chronic kidney disease), stage II    Coronary artery calcification seen on CAT scan 03/27/2021  Deafness in right ear    Dyspnea    cause by PE   Essential tremor    Extrinsic asthma, unspecified 04/07/2009   PT DENIES   GERD (gastroesophageal reflux disease)    Hearing loss    Hip problem    History of kidney stones    HYPERTENSION 10/29/2007   HYPOTENSION 08/13/2010   LOW BACK PAIN 04/21/2008   Mild aortic stenosis    NEPHROLITHIASIS, HX OF 04/21/2008   Neuropathy    Osteopenia    Other hyperlipidemia    Peptic ulcer    Pneumonia    PUD, HX OF 04/21/2008   Pulmonary emboli ([place]) 2021   noted on CT 12-2019   Renal disorder    Stage 3 chronic kidney disease ([place])    Thyroid nodule      He main concern for today's visit is: Chief Complaint  Patient presents with   Post COVID symptoms    Tested positive on 8/28, and was hospitalized from 8/30-9/1 for a recent  fall.   Head congestion   Chest congestion   Cough       Reports history of chf and blood clots in lungs Diagnosed COVID  08/26/22 and was hospitalized for 3-4 days after recurrent falls in [place] (no records yet), Today he still has Persistent dizzy, ha, cough (included blood in hospital but now just liquidy), doe, worse memory, malaise (generally feels bad) Denies chest pain or palpitations but does endorse chest congestion.  Fevers have stopped for over a week Denies muscle aches, denies leg swelling     Objective:  Physical Exam: BP 136/89 (BP Location: Left Arm, Patient Position: Sitting)   Pulse 66   Temp 98.1 F (36.7 C) (Temporal)   Resp 14   Ht '5\' 11"'$  (1.803 m)   Wt 236 lb 6.4 oz (107.2 kg)   SpO2 96%   BMI 32.97 kg/m   He  is a polite, friendly, and genuine person Constitutional: NAD, AAO, not ill-appearing  Neuro: alert, no focal deficit obvious, articulate speech Psych: normal mood, behavior, thought content   Problem specific physical exam findings:  Rales in lungs, tired appearing but no increased WOB. Irregular hr found to be sinus brady with pac  on ekg Has unsteady gait with cane but reports   Ordered and interpreted on-site 12 lead Ekg: sinus at 53, qt 406, pacs, old anteroseptal infarct.

## 2022-09-16 NOTE — Progress Notes (Unsigned)
Cardiology Office Note    Date:  09/17/2022   ID:  JW COVIN, DOB October 24, 1946, MRN [id number]  PCP:  [name], MD  Cardiologist:  Fransico Him, MD  Electrophysiologist:  None   Chief Complaint: DOE  History of Present Illness:   Bruce Ball is a 76 y.o. male with history of HTN, PE 12/2019 with evidence of R heart strain on chronic anticoagulation with Eliquis, CKD stage 3a, coronary artery calcification, aortic atherosclerosis, ankylosing spondylitis, arthritis, essential tremor, GERD, kidney stones, peptic ulcer, thyroid nodule, chronic HFpEF, mild dilation of ascending aorta who is seen for follow-up.  He established care with [name] during admission 02/2021 with LE edema after 2400 mile road trip to the [place] and eating fast foods. CTA showed no recurrent PE but did have coronary calcification. Echo during that time showed hyperdynamic EF >75% with intracavitary gradient, G1DD, normal RV, normal PASP, mild aortic sclerosis without stenosis, mild dilation of ascending aorta. BP was elevated. He was treated with Lasix and diuresed 3.2L with -16lb but creatinine bumped up so meds were adjusted with plan for OP referral to nephrology. Calcium score 05/2021 was 152 (43%ile) with extensive aortic arch and descending aorta atherosclerosis and aortic valve leaflet calcifications, continued on statin therapy. Per patient report he is now maintained on long term anticoagulation. This is not managed through our office.  He was seen by PCP yesterday for symptoms of persistent dizziness, DOE, worse memory, malaise, chest congestion and persistent cough. Symptoms were felt post-Covid in nature as he had been diagnosed with Covid 08/26/22 and hospitalized for 3-4 days after recurrent falls in [place] (records not available). He reports being tx with antiviral. At [facility] yesterday, EKG yesterday showed SB 53bpm with one PAC, otherwise nonacute. 2v CXR showed NAD. He was seen at [facility] this AM  for post-viral cough and prescribed doxycycline and prednisone; ED felt no specific concern for myocarditis, HF, ACS, PE or pericarditis. He is seen for follow-up today of these symptoms. He denies any edema, orthopnea, or chest pain. Has had occasional nonexertional fleeting left arm pain for a few months. He has lost 10lb since having Covid. He is also pending sinus surgery but not until 10/2022 - get delayed due to his Covid dx.   Labwork independently reviewed: Labs 09/16/22 BNP wnl, B12 OK, K 4.2, Cr 1.50, CBC stable 11/2021 trops neg, d-dimer 7, BNP wnl, Hgb 12.2, plt 322, K 3.5, Cr 1.35, albumin 3.2, AST/ALT OK 09/2021 TSH wnl, Mg ok 06/2021 LDL 50, trig 84  Cardiology Studies:   Studies reviewed are outlined and summarized above. Reports included below if pertinent.   Cor CT 05/2021 ADDENDUM REPORT: 05/31/2021 15:37   CLINICAL DATA:  Cardiovascular Disease Risk stratification   EXAM: Coronary Calcium Score   TECHNIQUE: A gated, non-contrast computed tomography scan of the heart was performed using 61m slice thickness. Axial images were analyzed on a dedicated workstation. Calcium scoring of the coronary arteries was performed using the Agatston method.   FINDINGS: Coronary arteries: Normal origins.   Coronary Calcium Score:   Left main: 0   Left anterior descending artery: 54.3   Left circumflex artery: 42.3   Right coronary artery: 55.4   Total: 152   Percentile: 43   Pericardium: Normal.   Ascending Aorta: Normal caliber. Extensive aortic arch and descending aorta atherosclerosis. Aortic valve leaflet calcifications   [facility]-cardiac: [facility]   IMPRESSION: 1. Coronary calcium score of 152. This was  43rd percentile for age-, race-, and sex-matched controls. 2. Extensive aortic arch and descending aorta atherosclerosis. Aortic valve leaflet calcifications  FINDINGS: Atherosclerotic calcifications in the thoracic aorta.  Calcified granuloma in the left upper lobe. Within the visualized portions of the thorax there are no suspicious appearing pulmonary nodules or masses, there is no acute consolidative airspace disease, no pleural effusions, no pneumothorax and no lymphadenopathy. Visualized portions of the upper abdomen are unremarkable. There are no aggressive appearing lytic or blastic lesions noted in the visualized portions of the skeleton.   IMPRESSION: 1.  Aortic Atherosclerosis (ICD10-I70.0).   Electronically Signed: By: Vinnie Langton M.D. On: 05/31/2021 11:27  Echo 02/2021    1. Intracavitary gradient. Peak velocity 2.07 m/s. Peak gradient 17 mmHg.  Left ventricular ejection fraction, by estimation, is >75%. Left  ventricular ejection fraction by 2D MOD biplane is 76.9 %. Left  ventricular ejection fraction by PLAX is 76 %. The   left ventricle has hyperdynamic function. The left ventricle has no  regional wall motion abnormalities. There is moderate concentric left  ventricular hypertrophy. Left ventricular diastolic parameters are  consistent with Grade I diastolic dysfunction  (impaired relaxation).   2. Right ventricular systolic function is normal. The right ventricular  size is normal. There is normal pulmonary artery systolic pressure.   3. The mitral valve is normal in structure. Trivial mitral valve  regurgitation. No evidence of mitral stenosis.   4. The aortic valve is calcified. There is mild calcification of the  aortic valve. There is mild thickening of the aortic valve. Aortic valve  regurgitation is not visualized. Mild aortic valve sclerosis is present,  with no evidence of aortic valve  stenosis.   5. Aortic dilatation noted. There is mild dilatation of the ascending  aorta, measuring 39 mm.   6. The inferior vena cava is normal in size with greater than 50%  respiratory variability, suggesting right atrial pressure of 3 mmHg.       Past Medical History:   Diagnosis Date   Ankylosing spondylitis ([place])    Arthritis of right knee    Basal cell carcinoma    Chronic back pain greater than 3 months duration    Chronic leg pain    CKD (chronic kidney disease), stage II    Coronary artery calcification seen on CAT scan 03/27/2021   Deafness in right ear    Dyspnea    cause by PE   Essential tremor    Extrinsic asthma, unspecified 04/07/2009   PT DENIES   GERD (gastroesophageal reflux disease)    Hearing loss    Hip problem    History of kidney stones    HYPERTENSION 10/29/2007   HYPOTENSION 08/13/2010   LOW BACK PAIN 04/21/2008   NEPHROLITHIASIS, HX OF 04/21/2008   Neuropathy    Osteopenia    Other hyperlipidemia    Peptic ulcer    Pneumonia    PUD, HX OF 04/21/2008   Pulmonary emboli ([place]) 2021   noted on CT 12-2019   Renal disorder    Stage 3 chronic kidney disease (HCC)    Thyroid nodule     Past Surgical History:  Procedure Laterality Date   BACK SURGERY     bone cancer  1970   rt femur removed and steel rod placed   [place], URETEROSCOPY AND STENT PLACEMENT Right 11/06/2017   Procedure: CYSTOSCOPY WITH  STENT PLACEMENT RIGHT;  Surgeon: Raynelle Bring, MD;  Location: [facility];  Service: Urology;  Laterality: Right;   CYSTOSCOPY WITH RETROGRADE PYELOGRAM, URETEROSCOPY AND STENT PLACEMENT Left 06/09/2019   Procedure: CYSTOSCOPY WITH RETROGRADE PYELOGRAM, URETEROSCOPY AND STENT PLACEMENT X2;  Surgeon: [name], MD;  Location: [facility];  Service: Urology;  Laterality: Left;   CYSTOSCOPY WITH RETROGRADE PYELOGRAM, URETEROSCOPY AND STENT PLACEMENT Bilateral 03/01/2020   Procedure: CYSTOSCOPY WITH RETROGRADE PYELOGRAM, URETEROSCOPY AND STENT PLACEMENT;  Surgeon: [name], MD;  Location: [facility];  Service: Urology;  Laterality: Bilateral;  75 MINS   CYSTOSCOPY/RETROGRADE/URETEROSCOPY     1 year ago at [place] in [place] Right 11/24/2017   Procedure:  CYSTOSCOPY/URETEROSCOPY/ RETROGRADE/STENT REMOVAL;  Surgeon: Raynelle Bring, MD;  Location: [facility];  Service: Urology;  Laterality: Right;   HOLMIUM LASER APPLICATION Bilateral 01/04/1095   Procedure: HOLMIUM LASER APPLICATION;  Surgeon: [name], MD;  Location: [facility];  Service: Urology;  Laterality: Bilateral;   prosatectomy      Current Medications: Current Meds  Medication Sig   acetaminophen (TYLENOL) 500 MG tablet Take by mouth.   apixaban (ELIQUIS) 2.5 MG TABS tablet Take 1 tablet (2.5 mg total) by mouth 2 (two) times daily.   atorvastatin (LIPITOR) 20 MG tablet Take 1 tablet (20 mg total) by mouth at bedtime.   Calcium Carb-Cholecalciferol 600-10 MG-MCG TABS Take 1 tablet by mouth 2 (two) times daily.   carvedilol (COREG) 25 MG tablet Take 12.5 mg by mouth 2 (two) times daily.   Cholecalciferol (VITAMIN D3) 50 MCG (2000 UT) capsule Take 2,000 Units by mouth in the morning and at bedtime.    cyclobenzaprine (FLEXERIL) 10 MG tablet TAKE ONE-HALF TABLET BY MOUTH AT BEDTIME FOR MUSCLE PAIN * CAUSES HANGOVER, DO NOT DRIVE   doxycycline (VIBRAMYCIN) 100 MG capsule Take 1 capsule (100 mg total) by mouth 2 (two) times daily for 7 days.   ferrous sulfate 325 (65 FE) MG tablet Take 325 mg by mouth every Monday, Wednesday, and Friday.   fluticasone (FLONASE) 50 MCG/ACT nasal spray Place 1-2 sprays into both nostrils daily as needed for allergies or rhinitis.   gabapentin (NEURONTIN) 300 MG capsule Take 300 mg by mouth at bedtime.   pantoprazole (PROTONIX) 40 MG tablet Take 40 mg by mouth 2 (two) times daily before a meal.   predniSONE (DELTASONE) 10 MG tablet Take 2 tablets (20 mg total) by mouth daily for 4 days.      Allergies:   Erythromycin and Lisinopril   Social History   Socioeconomic History   Marital status: Married    Spouse name: Not on file   Number of children: 2   Years of education: Not on file   Highest education level: Not on file  Occupational History    Occupation: Retired Optometrist  Tobacco Use   Smoking status: Former    Packs/day: 1.00    Years: 17.00    Total pack years: 17.00    Types: Cigarettes    Start date: 1963    Quit date: 12/30/1978    Years since quitting: 43.7   Smokeless tobacco: Never  Vaping Use   Vaping Use: Never used  Substance and Sexual Activity   Alcohol use: No   Drug use: No   Sexual activity: Not on file  Other Topics Concern   Not on file  Social History Narrative   ** Merged History Encounter **       Social Determinants of Health   Financial Resource Strain: Low Risk  (01/25/2022)   Overall Financial Resource Strain (CARDIA)  Difficulty of Paying Living Expenses: Not hard at all  Food Insecurity: No Food Insecurity (01/25/2022)   Hunger Vital Sign    Worried About Running Out of Food in the Last Year: Never true    Ran Out of Food in the Last Year: Never true  Transportation Needs: No Transportation Needs (01/25/2022)   PRAPARE - Hydrologist (Medical): No    Lack of Transportation (Non-Medical): No  Physical Activity: Insufficiently Active (01/25/2022)   Exercise Vital Sign    Days of Exercise per Week: 2 days    Minutes of Exercise per Session: 50 min  Stress: No Stress Concern Present (01/25/2022)   [place]    Feeling of Stress : Not at all  Social Connections: Moderately Isolated (01/25/2022)   Social Connection and Isolation Panel [NHANES]    Frequency of Communication with Friends and Family: More than three times a week    Frequency of Social Gatherings with Friends and Family: More than three times a week    Attends Religious Services: Never    Marine scientist or Organizations: No    Attends Music therapist: Never    Marital Status: Married     Family History:  The patient's family history includes COPD in his father; Depression in his brother; Hypertension in his  father; Prostate cancer in his brother; Stomach cancer in his brother; Stroke in his brother. There is no history of Esophageal cancer, Pancreatic cancer, or Colon cancer.  ROS:   Please see the history of present illness. All other systems are reviewed and otherwise negative.    EKG(s)/Additional Labs   EKG:  EKG is not ordered today, reviewed as above from prior OV  Recent Labs: 10/11/2021: TSH 1.806 10/12/2021: Magnesium 2.0 12/23/2021: B Natriuretic Peptide 30.1 09/16/2022: ALT 12; BUN 20; Creatinine, Ser 1.50; Hemoglobin 14.4; Platelets 230.0; Potassium 4.2; Pro B Natriuretic peptide (BNP) 81.0; Sodium 142  Recent Lipid Panel    Component Value Date/Time   CHOL 101 07/11/2021 0000   CHOL 103 06/05/2021 0935   TRIG 84 07/11/2021 0000   HDL 34 (A) 07/11/2021 0000   HDL 38 (L) 06/05/2021 0935   CHOLHDL 3 04/03/2021 1637   VLDL 19.2 04/03/2021 1637   LDLCALC 50 07/11/2021 0000   LDLCALC 52 06/05/2021 0935   LDLDIRECT 145.4 06/27/2011 1124    PHYSICAL EXAM:    VS:  BP (!) 122/58   Pulse 68   Ht '5\' 11"'$  (1.803 m)   Wt 235 lb (106.6 kg)   SpO2 98%   BMI 32.78 kg/m   BMI: Body mass index is 32.78 kg/m.  GEN: Well nourished, well developed male in no acute distress HEENT: normocephalic, atraumatic, nasally voice Neck: no JVD, carotid bruits, or masses Cardiac: RRR; no murmurs, rubs, or gallops, no edema  Respiratory:  clear to auscultation bilaterally, normal work of breathing GI: soft, nontender, nondistended, + BS MS: no deformity or atrophy Skin: warm and dry, no rash Neuro:  Alert and Oriented x 3, Strength and sensation are intact, follows commands. HOH Psych: euthymic mood, full affect  Wt Readings from Last 3 Encounters:  09/17/22 235 lb (106.6 kg)  09/17/22 231 lb (104.8 kg)  09/16/22 236 lb 6.4 oz (107.2 kg)     ASSESSMENT & PLAN:   1. Dyspnea on exertion - nonspecific, onset correlates with what he reports was severe case of Covid in August with  lingering  cough, generalized weakness, decreased stamina, and brain fog. He also sounds persistently nasally. Started on doxyxycline and prednisone today at outside facility. He has not had any anginal type of chest pain. Symptoms sound likely related to recent infection but would like to pursue 2D echocardiogram to evaluate for any changes in wall motion or structural abnormalities. If EF is down would certainly warrant further workup and consideration of cath but if EF is normal, would pursue Lexiscan nuclear stress test. Walks with cane so got a good treadmill candidate. With renal insufficiency and prior elevated calcium score, coronary CTA would be less ideal. If cardiac workup is reassuring may benefit from pulmonology referral or further chest imaging. Of note, his wife reports he is pending endoscopic sinus surgery in 10/2022. This apparently had to be r/s due to his Covid infection. We will revisit finalized clearance at his follow-up visit after we have the results of his cardiac testing. Clearance to hold his Eliquis would have to come at discretion of the provider managing this, since he is not on this for cardiac reasons.  Preliminarily discussed stress test with patient if EF is normal - will plan to review echo report before ordering. Shared Decision Making/Informed Consent The risks [chest pain, shortness of breath, cardiac arrhythmias, dizziness, blood pressure fluctuations, myocardial infarction, stroke/transient ischemic attack, nausea, vomiting, allergic reaction, radiation exposure, metallic taste sensation and life-threatening complications (estimated to be 1 in 10,000)], benefits (risk stratification, diagnosing coronary artery disease, treatment guidance) and alternatives of a nuclear stress test were discussed in detail with Bruce Ball and he agrees to proceed.   2. Essential HTN - BP controlled. Our records had carvedilol listed as 12.'5mg'$  BID of a '25mg'$  tablet. He thinks he is on a  full tablet but will check the dose at home and let us know.  3. Chronic HFpEF - volume status appears normal on exam. Lungs clear, no edema, BNP normal. No indication to add diuretic at present time. F/u echo as above.  4. Coronary calcification - not on ASA due to concomitant apixaban for his h/o PE. His apixaban is not managed by our team; defer to PCP/heme-onc for long term duration. He thinks he is taking a half tablet twice a day but he will double check dose strength at home so that we can update his med list to reflect what he is taking. This was supposed to be 2.'5mg'$  BID by last fill by heme-onc. Last LDL 50 in 06/2021. Recommend to revisit follow-up labs upon return to clinic when he is feeling better.  5. Mild dilation of ascending aorta - will await echo report which should give [name] information on aortic size.     Disposition: F/u with me in 1 month (after testing above).   Medication Adjustments/Labs and Tests Ordered: Current medicines are reviewed at length with the patient today.  Concerns regarding medicines are outlined above. Medication changes, Labs and Tests ordered today are summarized above and listed in the Patient Instructions accessible in Encounters.   Signed, Charlie Pitter, PA-C  09/17/2022 12:06 PM    [place] Phone: [phone]; Fax: [phone]

## 2022-09-16 NOTE — Assessment & Plan Note (Signed)
D/T irreg heart beat, doe with cough and rales- suspect myocarditis and obtained ekg and referring to cardiology Suspect memory/doe/malaise/dizzy are all r/w poor oxygenation status secondary to poor heart and lung function in the wake of covid. He follows with Fransico Him for cardiology and [name] as PCP will cc note to them; I encourage soon follow up with both especially Dr. Radford Pax. Request he complete release of information

## 2022-09-16 NOTE — Assessment & Plan Note (Signed)
Seems to have been flared by covid Advised cardiology, xray, labwork, lasix

## 2022-09-16 NOTE — Patient Instructions (Addendum)
It was a pleasure seeing you today!  Today the plan is...  Sign release of information at the check out desk for Hospitalization records from [facility] today  Please call [facility] to see if you can qualify for oxygen there. Avoid heavy exertion in meantime.   Please go to our [facility] office to get your xrays done. You can walk in M-F between 8:30am- noon or 1pm - 5pm. Tell them you are there for xrays ordered by me. They will send me the results, then I will let you know the results with instructions.   Address: [address]  The Xray department is located in the basement.     I am sharing your visit note with your Cardiologist- please call for appointment soon for further workup. Team Member Role and Specialty Contact Info Address Start End Comments  Sueanne Margarita, MD Cardiology (Cardiology) Phone: [phone] Fax: [phone]  [address] [place] [address] 04/25/2021 - Merged    Post-COVID syndrome Overview: Diagnosed COVID  08/26/22 and was hospitalized for 3-4 days after recurrent falls in [place] (no records yet), Persistent dizzy, ha, cough (included blood in hospital but now just liquidy), doe, worse memory, malaise (generally feels bad) Denies chest pain or palpitations but has chest congestion Fevers have stopped for over a week Denies muscle aches  Assessment & Plan: D/T irreg heart beat, doe with cough and rales- suspect myocarditis and obtained ekg and referring to cardiology Suspect memory/doe/malaise/dizzy are all r/w poor oxygenation status secondary to poor heart and lung function in the wake of covid. He follows with Fransico Him for cardiology and [name] as PCP will cc note to them; I encourage soon follow up with both especially Dr. Radford Pax. Request he complete release of information   I want to reassure you that I think your symptoms are common complications of severe COVID that should slowly improve  over time on their own even if we do not do anything more.  I encourage you to check your oxygen at home with one of the oximeters that you can buy over-the-counter regularly so that you can make sure that you are staying above 90% and go to the hospital if you do not.  I think your symptoms are coming from sometimes not getting enough oxygen due to your COVID  Orders: -     EKG 12-Lead - I reviewed- there was nothing worrisome. -     DG Chest 2 View; Future- go to [facility] to get done     Loralee Pacas, MD     - Please bring all your medicines to your next appointment. This is the best way for a provider to know exactly what you're taking.  - If your condition begins to worsen or become severe:  go to the ER. - If your condition fails to resolve or you have other questions / concerns: please contact me via phone [phone] or MyChart messaging.   Reschedule an appointment with me or Dr. Yong Channel    IF you received an x-ray today, you will receive an invoice from [facility]. Please contact [facility] at [phone] with questions or concerns regarding your invoice.    IF you received labwork today, you will receive an invoice from [facility]. Please contact [facility] at [phone] with questions or concerns regarding your invoice.    Our billing staff will not be able to assist you with questions regarding bills from  these companies.   You will be contacted with the lab results as soon as they are available. The fastest way to get your results is to activate your My Chart account. Instructions are located on the last page of this paperwork. If you have not heard from [name] regarding the results in 2 weeks, please contact this office. For any labs or imaging tests, we will call you if the results are significantly abnormal.  Most normal results will be posted to myChart as soon as they are available and I will comment on them there within 2-3 business days.

## 2022-09-16 NOTE — Progress Notes (Signed)
I cant reach patient by phone, please call again in am.  I am putitng note to mychart to encourage ER for further evaluation based on Cardiologist recommendation after reviewing my visit notes.

## 2022-09-17 ENCOUNTER — Emergency Department (HOSPITAL_BASED_OUTPATIENT_CLINIC_OR_DEPARTMENT_OTHER): Payer: Medicare Other

## 2022-09-17 ENCOUNTER — Other Ambulatory Visit (HOSPITAL_BASED_OUTPATIENT_CLINIC_OR_DEPARTMENT_OTHER): Payer: Self-pay

## 2022-09-17 ENCOUNTER — Encounter: Payer: Self-pay | Admitting: Physician Assistant

## 2022-09-17 ENCOUNTER — Ambulatory Visit (INDEPENDENT_AMBULATORY_CARE_PROVIDER_SITE_OTHER): Payer: Medicare Other | Admitting: Physician Assistant

## 2022-09-17 ENCOUNTER — Emergency Department (HOSPITAL_BASED_OUTPATIENT_CLINIC_OR_DEPARTMENT_OTHER)
Admission: EM | Admit: 2022-09-17 | Discharge: 2022-09-17 | Disposition: A | Discharge status: Home / Self Care | Payer: Medicare Other | Attending: Emergency Medicine | Admitting: Emergency Medicine

## 2022-09-17 ENCOUNTER — Encounter (HOSPITAL_BASED_OUTPATIENT_CLINIC_OR_DEPARTMENT_OTHER): Payer: Self-pay | Admitting: Emergency Medicine

## 2022-09-17 ENCOUNTER — Other Ambulatory Visit: Payer: Self-pay

## 2022-09-17 VITALS — BP 122/58 | HR 68 | Ht 71.0 in | Wt 235.0 lb

## 2022-09-17 DIAGNOSIS — I5032 Chronic diastolic (congestive) heart failure: Secondary | ICD-10-CM | POA: Diagnosis not present

## 2022-09-17 DIAGNOSIS — I2510 Atherosclerotic heart disease of native coronary artery without angina pectoris: Secondary | ICD-10-CM | POA: Diagnosis not present

## 2022-09-17 DIAGNOSIS — Z7901 Long term (current) use of anticoagulants: Secondary | ICD-10-CM | POA: Diagnosis not present

## 2022-09-17 DIAGNOSIS — R262 Difficulty in walking, not elsewhere classified: Secondary | ICD-10-CM | POA: Diagnosis not present

## 2022-09-17 DIAGNOSIS — Z79899 Other long term (current) drug therapy: Secondary | ICD-10-CM | POA: Diagnosis not present

## 2022-09-17 DIAGNOSIS — M79604 Pain in right leg: Secondary | ICD-10-CM | POA: Diagnosis not present

## 2022-09-17 DIAGNOSIS — I77810 Thoracic aortic ectasia: Secondary | ICD-10-CM

## 2022-09-17 DIAGNOSIS — N189 Chronic kidney disease, unspecified: Secondary | ICD-10-CM | POA: Insufficient documentation

## 2022-09-17 DIAGNOSIS — I1310 Hypertensive heart and chronic kidney disease without heart failure, with stage 1 through stage 4 chronic kidney disease, or unspecified chronic kidney disease: Secondary | ICD-10-CM | POA: Insufficient documentation

## 2022-09-17 DIAGNOSIS — R059 Cough, unspecified: Secondary | ICD-10-CM | POA: Diagnosis not present

## 2022-09-17 DIAGNOSIS — M6281 Muscle weakness (generalized): Secondary | ICD-10-CM | POA: Diagnosis not present

## 2022-09-17 DIAGNOSIS — I10 Essential (primary) hypertension: Secondary | ICD-10-CM | POA: Diagnosis not present

## 2022-09-17 DIAGNOSIS — R0609 Other forms of dyspnea: Secondary | ICD-10-CM | POA: Diagnosis not present

## 2022-09-17 DIAGNOSIS — I2584 Coronary atherosclerosis due to calcified coronary lesion: Secondary | ICD-10-CM

## 2022-09-17 DIAGNOSIS — R0602 Shortness of breath: Secondary | ICD-10-CM | POA: Diagnosis not present

## 2022-09-17 DIAGNOSIS — B349 Viral infection, unspecified: Secondary | ICD-10-CM | POA: Diagnosis not present

## 2022-09-17 DIAGNOSIS — M25551 Pain in right hip: Secondary | ICD-10-CM | POA: Diagnosis not present

## 2022-09-17 DIAGNOSIS — R058 Other specified cough: Secondary | ICD-10-CM

## 2022-09-17 MED ORDER — PREDNISONE 50 MG PO TABS
60.0000 mg | ORAL_TABLET | Freq: Once | ORAL | Status: AC
  Administered 2022-09-17: 60 mg via ORAL
  Filled 2022-09-17: qty 1

## 2022-09-17 MED ORDER — DOXYCYCLINE HYCLATE 100 MG PO CAPS
100.0000 mg | ORAL_CAPSULE | Freq: Two times a day (BID) | ORAL | 0 refills | Status: AC
  Filled 2022-09-17: qty 14, 7d supply, fill #0

## 2022-09-17 MED ORDER — PREDNISONE 10 MG PO TABS
20.0000 mg | ORAL_TABLET | Freq: Every day | ORAL | 0 refills | Status: AC
  Filled 2022-09-17: qty 8, 4d supply, fill #0

## 2022-09-17 MED ORDER — ALBUTEROL SULFATE HFA 108 (90 BASE) MCG/ACT IN AERS
2.0000 | INHALATION_SPRAY | Freq: Once | RESPIRATORY_TRACT | Status: AC
  Administered 2022-09-17: 2 via RESPIRATORY_TRACT
  Filled 2022-09-17: qty 6.7

## 2022-09-17 NOTE — ED Provider Notes (Signed)
[facility] EMERGENCY DEPT Provider Note   CSN: [id number] Arrival date & time: 09/17/22  1914     History  Chief Complaint  Patient presents with   Cough    Bruce Ball is a 76 y.o. male.  Patient here with cough.  COVID about 2-1/2 3 weeks ago.  Continues with dry cough.  Had blood work and chest x-ray done yesterday by primary care doctor.  Not sure what those results are.  Continue with cough.  No chest pain.  Shortness of breath at times.  History of blood clots on Eliquis and has not missed any doses.  History of hypertension, CKD.  Denies any chest pain or abdominal pain or nausea or vomiting.  Nothing makes it worse or better.  The history is provided by the patient.       Home Medications Prior to Admission medications   Medication Sig Start Date End Date Taking? Authorizing Provider  doxycycline (VIBRAMYCIN) 100 MG capsule Take 1 capsule (100 mg total) by mouth 2 (two) times daily for 7 days. 09/17/22 09/24/22 Yes Akashdeep Chuba, DO  predniSONE (DELTASONE) 10 MG tablet Take 2 tablets (20 mg total) by mouth daily for 4 days. 09/17/22 09/21/22 Yes Isley Weisheit, DO  acetaminophen (TYLENOL) 325 MG tablet Take 2 tablets (650 mg total) by mouth every 6 (six) hours as needed for mild pain or moderate pain. 03/29/21   Hongalgi, Lenis Dickinson, MD  acetaminophen (TYLENOL) 500 MG tablet Take by mouth. 08/06/22   [provider]  apixaban (ELIQUIS) 2.5 MG TABS tablet Take 1 tablet (2.5 mg total) by mouth 2 (two) times daily. 01/25/21   [name], MD  atorvastatin (LIPITOR) 20 MG tablet Take 1 tablet (20 mg total) by mouth at bedtime. 07/19/21   Sueanne Margarita, MD  atorvastatin (LIPITOR) 20 MG tablet Take by mouth. 03/01/17   [provider]  Calcium Carb-Cholecalciferol 600-10 MG-MCG TABS Take 1 tablet by mouth 2 (two) times daily. 10/22/21 10/22/22  [provider]  carvedilol (COREG) 25 MG tablet Take 12.5 mg by mouth 2 (two) times daily.     [provider]  Cholecalciferol (VITAMIN D3) 50 MCG (2000 UT) capsule Take 2,000 Units by mouth in the morning and at bedtime.     [provider]  Cholecalciferol 50 MCG (2000 UT) TABS TAKE ONE TABLET BY MOUTH TWICE A DAY BEFORE MEALS 08/06/22   [provider]  cyclobenzaprine (FLEXERIL) 10 MG tablet TAKE ONE-HALF TABLET BY MOUTH AT BEDTIME FOR MUSCLE PAIN * CAUSES HANGOVER, DO NOT DRIVE 7/82/95   [provider]  ferrous sulfate 325 (65 FE) MG tablet Take 325 mg by mouth every Monday, Wednesday, and Friday.    [provider]  fluticasone (FLONASE) 50 MCG/ACT nasal spray Place 1-2 sprays into both nostrils daily as needed for allergies or rhinitis.    [provider]  gabapentin (NEURONTIN) 300 MG capsule Take 300 mg by mouth at bedtime.    [provider]  pantoprazole (PROTONIX) 40 MG tablet Take 40 mg by mouth 2 (two) times daily before a meal.    [provider]      Allergies    Erythromycin and Lisinopril    Review of Systems   Review of Systems  Physical Exam Updated Vital Signs Pulse 63   Temp 97.7 F (36.5 C)   Resp 16  Physical Exam Vitals and nursing note reviewed.  Constitutional:      General: Bruce Ball is not in  acute distress.    Appearance: Bruce Ball is well-developed. Bruce Ball is not ill-appearing.  HENT:     Head: Normocephalic and atraumatic.     Nose: Nose normal.     Mouth/Throat:     Mouth: Mucous membranes are moist.  Eyes:     Extraocular Movements: Extraocular movements intact.     Conjunctiva/sclera: Conjunctivae normal.     Pupils: Pupils are equal, round, and reactive to light.  Cardiovascular:     Rate and Rhythm: Normal rate and regular rhythm.     Pulses: Normal pulses.     Heart sounds: Normal heart sounds. No murmur heard. Pulmonary:     Effort: Pulmonary effort is normal. No respiratory distress.     Comments: Coarse breath sounds Abdominal:     Palpations: Abdomen is soft.      Tenderness: There is no abdominal tenderness.  Musculoskeletal:        General: No swelling.     Cervical back: Normal range of motion and neck supple.     Right lower leg: No edema.     Left lower leg: No edema.  Skin:    General: Skin is warm and dry.     Capillary Refill: Capillary refill takes less than 2 seconds.  Neurological:     General: No focal deficit present.     Mental Status: Bruce Ball is alert.  Psychiatric:        Mood and Affect: Mood normal.     ED Results / Procedures / Treatments   Labs (all labs ordered are listed, but only abnormal results are displayed) Labs Reviewed - No data to display  EKG EKG Interpretation  Date/Time:  Tuesday September 17 2022 08:55:05 EDT Ventricular Rate:  60 PR Interval:  196 QRS Duration: 86 QT Interval:  396 QTC Calculation: 396 R Axis:   35 Text Interpretation: Sinus rhythm Multiple premature complexes, vent & supraven Confirmed by Lennice Sites ([id number]) on 09/17/2022 8:57:53 AM  Radiology DG Chest 2 View  Result Date: 09/17/2022 CLINICAL DATA:  Dyspnea on exertion EXAM: CHEST - 2 VIEW COMPARISON:  03/08/2022 FINDINGS: The heart size and mediastinal contours are within normal limits. No focal airspace consolidation, pleural effusion, or pneumothorax. Ankylosis of the thoracic vertebral bodies. IMPRESSION: No active cardiopulmonary disease. Electronically Signed   By: Davina Poke D.O.   On: 09/17/2022 09:03    Procedures Procedures    Medications Ordered in ED Medications  predniSONE (DELTASONE) tablet 60 mg (has no administration in time range)  albuterol (VENTOLIN HFA) 108 (90 Base) MCG/ACT inhaler 2 puff (has no administration in time range)    ED Course/ Medical Decision Making/ A&P                           Medical Decision Making Risk Prescription drug management.   GEARY RUFO is here with cough.  Normal vitals.  No fever.  History of CKD, hypertension.  COVID about 2 to 3 weeks ago.  Bruce Ball main concern is  about ongoing cough.  Coughing is so bad Bruce Ball cannot sleep at night.  Denies any fevers or chills.  Denies any chest pain or significant shortness of breath.  Per my chart review Bruce Ball had chest x-ray including CBC, BMP, BNP by primary care doctor yesterday.  These lab values per my review and interpretation are normal.  BNP is undetectable/low.  Chest x-ray shows no evidence of pneumonia or volume overload.  EKG today shows sinus  rhythm.  No ischemic changes.  Bruce Ball is not having any chest pain.  There is no signs of enlarged heart on chest x-ray.  Bruce Ball has no significant anemia, electrolyte abnormality, kidney injury.  Bruce Ball looks very comfortable.  Maybe some coarse breath sounds on exam.  Bruce Ball has normal vitals.  Overall I have no concern for heart failure or ACS or myocarditis or pericarditis.  Bruce Ball is not having any pain.  I suspect that Bruce Ball has a postviral cough which I will treat with albuterol and prednisone and conservatively will give antibiotic.  I do not think Bruce Ball needs any further cardiac or pulmonary work-up at this time.  Bruce Ball is already on blood thinners for PE history.  Bruce Ball has been compliant with this medication.  Bruce Ball is not hypoxic or tachycardic and overall do not think Bruce Ball has a blood clot.  Patient is comfortable with this plan.  Told to return if symptoms worsen.  Discharged in good condition.  This chart was dictated using voice recognition software.  Despite best efforts to proofread,  errors can occur which can change the documentation meaning.         Final Clinical Impression(s) / ED Diagnoses Final diagnoses:  Post-viral cough syndrome    Rx / DC Orders ED Discharge Orders          Ordered    predniSONE (DELTASONE) 10 MG tablet  Daily        09/17/22 0914    doxycycline (VIBRAMYCIN) 100 MG capsule  2 times daily        09/17/22 0914              Lennice Sites, DO 09/17/22 [phone]

## 2022-09-17 NOTE — ED Triage Notes (Signed)
Pt from home, c/o SOB and cough. Pt was diagnosed with covid 3 weeks ago and stated that he flet a little sob this morning . Pt currently at 98% and vitals WDL

## 2022-09-17 NOTE — Discharge Instructions (Addendum)
Take next dose of prednisone tomorrow.  Use inhaler 2 puffs every 4-6 hours as needed for cough.  Recommend drinking warm tea and honey for your cough as well.  I have also conservatively treated you with antibiotic called doxycycline.  Take that medication when you get home.  Overall your blood work yesterday and chest x-ray were unremarkable.  I do not think you have heart failure or other acute process at this time.  I suspect you have a postviral cough from your recent COVID infection.  Your vital signs were normal today.

## 2022-09-17 NOTE — ED Notes (Signed)
Pt assessed by RT

## 2022-09-17 NOTE — Patient Instructions (Addendum)
Medication Instructions:  Double check with the providers that prescribe your Apixaban and carvedilol and let us know the correct doses. *If you need a refill on your cardiac medications before your next appointment, please call your pharmacy*   Lab Work: None Ordered   Testing/Procedures: Your physician has requested that you have an echocardiogram. Echocardiography is a painless test that uses sound waves to create images of your heart. It provides your doctor with information about the size and shape of your heart and how well your heart's chambers and valves are working. This procedure takes approximately one hour. There are no restrictions for this procedure.    Follow-Up: At [facility], you and your health needs are our priority.  As part of our continuing mission to provide you with exceptional heart care, we have created designated Provider Care Teams.  These Care Teams include your primary Cardiologist (physician) and Advanced Practice Providers (APPs -  Physician Assistants and Nurse Practitioners) who all work together to provide you with the care you need, when you need it.  We recommend signing up for the patient portal called "MyChart".  Sign up information is provided on this After Visit Summary.  MyChart is used to connect with patients for Virtual Visits (Telemedicine).  Patients are able to view lab/test results, encounter notes, upcoming appointments, etc.  Non-urgent messages can be sent to your provider as well.   To learn more about what you can do with MyChart, go to [email or web address].    Your next appointment:   1 month(s)  or  after echo  The format for your next appointment:   In Person  Provider:   Melina Copa, PA-C       Other Instructions   Important Information About Sugar

## 2022-09-18 ENCOUNTER — Telehealth: Payer: Self-pay | Admitting: Pharmacist

## 2022-09-18 ENCOUNTER — Ambulatory Visit (HOSPITAL_BASED_OUTPATIENT_CLINIC_OR_DEPARTMENT_OTHER): Payer: Medicare Other | Admitting: Physical Therapy

## 2022-09-18 ENCOUNTER — Encounter (HOSPITAL_BASED_OUTPATIENT_CLINIC_OR_DEPARTMENT_OTHER): Payer: Self-pay | Admitting: Physical Therapy

## 2022-09-18 DIAGNOSIS — M6281 Muscle weakness (generalized): Secondary | ICD-10-CM

## 2022-09-18 DIAGNOSIS — R0609 Other forms of dyspnea: Secondary | ICD-10-CM | POA: Diagnosis not present

## 2022-09-18 DIAGNOSIS — M79604 Pain in right leg: Secondary | ICD-10-CM | POA: Diagnosis not present

## 2022-09-18 DIAGNOSIS — I10 Essential (primary) hypertension: Secondary | ICD-10-CM | POA: Diagnosis not present

## 2022-09-18 DIAGNOSIS — M25551 Pain in right hip: Secondary | ICD-10-CM | POA: Diagnosis not present

## 2022-09-18 DIAGNOSIS — R262 Difficulty in walking, not elsewhere classified: Secondary | ICD-10-CM | POA: Diagnosis not present

## 2022-09-18 NOTE — Progress Notes (Signed)
////   Chronic Care Management Pharmacy Assistant   Name: Bruce Ball  MRN: [id number] DOB: February 13, 1946   Reason for Encounter: General Adherence Call    Recent office visits:  09/16/2022 [facility] (Fam Med) Bruce Pacas, MD; no medication changes noted.  Recent consult visits:  09/17/2022 [facility] (Cardiology) Bruce Pitter, PA-C; no medication changes noted.  Hospital visits:  09/17/2022 ED visits for Post viral cough syndrome -  I suspect that he has a postviral cough which I will treat with albuterol and prednisone and conservatively will give antibiotic  Medications: Outpatient Encounter Medications as of 09/18/2022  Medication Sig   acetaminophen (TYLENOL) 500 MG tablet Take by mouth.   apixaban (ELIQUIS) 2.5 MG TABS tablet Take 1 tablet (2.5 mg total) by mouth 2 (two) times daily.   atorvastatin (LIPITOR) 20 MG tablet Take 1 tablet (20 mg total) by mouth at bedtime.   Calcium Carb-Cholecalciferol 600-10 MG-MCG TABS Take 1 tablet by mouth 2 (two) times daily.   carvedilol (COREG) 25 MG tablet Take 12.5 mg by mouth 2 (two) times daily.   Cholecalciferol (VITAMIN D3) 50 MCG (2000 UT) capsule Take 2,000 Units by mouth in the morning and at bedtime.    cyclobenzaprine (FLEXERIL) 10 MG tablet TAKE ONE-HALF TABLET BY MOUTH AT BEDTIME FOR MUSCLE PAIN * CAUSES HANGOVER, DO NOT DRIVE   doxycycline (VIBRAMYCIN) 100 MG capsule Take 1 capsule (100 mg total) by mouth 2 (two) times daily for 7 days.   ferrous sulfate 325 (65 FE) MG tablet Take 325 mg by mouth every Monday, Wednesday, and Friday.   fluticasone (FLONASE) 50 MCG/ACT nasal spray Place 1-2 sprays into both nostrils daily as needed for allergies or rhinitis.   gabapentin (NEURONTIN) 300 MG capsule Take 300 mg by mouth at bedtime.   pantoprazole (PROTONIX) 40 MG tablet Take 40 mg by mouth 2 (two) times daily before a meal.   predniSONE (DELTASONE) 10 MG tablet Take 2 tablets (20 mg total) by mouth daily for 4 days.   No  facility-administered encounter medications on file as of 09/18/2022.   [place] for General Review Call   Chart Review:  Have there been any documented new, changed, or discontinued medications since last visit? Yes (If yes, include name, dose, frequency, date) Has there been any documented recent hospitalizations or ED visits since last visit with Clinical Pharmacist? Yes Brief Summary: Patient went to ED on 09/17/2022 for shortness of breath. He was treated with Prednisone and albuterol.   Adherence Review:  Does the Clinical Pharmacist Assistant have access to adherence rates? Yes Adherence rates for STAR metric medications: None available for Atorvastatin. Does the patient have >5 day gap between last estimated fill dates for any of the above medications or other medication gaps? No Reason for medication gaps.   Disease State Questions:  Able to connect with Patient? Yes Did patient have any problems with their health recently? Yes Note problems and Concerns: Patient has had some shortness of breath. He states he has been improving and feeling better since starting Prednisone. Have you had any admissions or emergency room visits or worsening of your condition(s) since last visit? Yes Details of ED visit, hospital visit and/or worsening condition(s):  Patient went to ED on 09/17/2022 for shortness of breath. He was treated with Prednisone and albuterol. Have you had any visits with new specialists or providers since your last visit? No Have you had any new health care problem(s) since your last visit?  No Have you run out of any of your medications since you last spoke with clinical pharmacist? No Are there any medications you are not taking as prescribed? No Are you having any issues or side effects with your medications? No Do you have any other health concerns or questions you want to discuss with your Clinical Pharmacist before your next visit? No Are there any  health concerns that you feel we can do a better job addressing? No Are you having any problems with any of the following since the last visit: (select all that apply)  None 12. Any falls since last visit? No 13. Any increased or uncontrolled pain since last visit? No  - Patient states he has an echocardiogram scheduled for Friday 09/20/2022.   Care Gaps: Medicare Annual Wellness: Completed 01/25/2022 Hemoglobin A1C: 5.5% on 06/05/2021 Colonoscopy: Completed 09/26/2021  Future Appointments  Date Time Provider [place]  09/18/2022 11:15 AM Bruce Ball [facility]  09/20/2022  9:20 AM [facility] E[facility] 2 [id number]  09/20/2022 11:45 AM Bruce Ball [facility]  09/25/2022 11:15 AM Bruce Ball [facility]  09/27/2022 11:45 AM Bruce Ball, Ball [facility]  10/02/2022 11:15 AM Bruce Ball [facility]  10/09/2022 11:15 AM Bruce Ball, Bruce Ball [facility]  10/11/2022 11:45 AM Bruce Ball, Ball [facility]  10/16/2022 11:15 AM Bruce Ball [facility]  10/18/2022 11:45 AM Bruce Ball, Ball [facility]  10/23/2022 11:15 AM Bruce Ball [facility]  10/25/2022 11:45 AM Bruce Ball, Ball [facility]  10/30/2022 11:20 AM Bruce Pitter, PA-C [facility]  01/31/2023 11:45 AM [facility] HEALTH COACH [facility]  05/06/2023  3:00 PM [facility] CCM PHARMACIST [facility]   Star Rating Drugs: Atorvastatin - no fill date available  Bruce Ball, [place] Pharmacist Assistant [phone]

## 2022-09-18 NOTE — Therapy (Signed)
OUTPATIENT PHYSICAL THERAPY TREATMENT NOTE     Patient Name: Bruce Ball MRN: [id number] DOB:November 07, 1946, 76 y.o., male Today's Date: 09/18/2022  PCP: [name], MD REFERRING PROVIDER: [name], MD   PT End of Session - 09/18/22 1119     Visit Number 41    Number of Visits 5    Date for PT Re-Evaluation 10/25/22    Authorization Type MCR    Progress Note Due on Visit 45    PT Start Time 1109    PT Stop Time 1150    PT Time Calculation (min) 41 min    Behavior During Therapy [facility] for tasks assessed/performed                   Past Medical History:  Diagnosis Date   Ankylosing spondylitis ([place])    Arthritis of right knee    Basal cell carcinoma    Chronic back pain greater than 3 months duration    Chronic leg pain    CKD (chronic kidney disease), stage II    Coronary artery calcification seen on CAT scan 03/27/2021   Deafness in right ear    Dyspnea    cause by PE   Essential tremor    Extrinsic asthma, unspecified 04/07/2009   PT DENIES   GERD (gastroesophageal reflux disease)    Hearing loss    Hip problem    History of kidney stones    HYPERTENSION 10/29/2007   HYPOTENSION 08/13/2010   LOW BACK PAIN 04/21/2008   NEPHROLITHIASIS, HX OF 04/21/2008   Neuropathy    Osteopenia    Other hyperlipidemia    Peptic ulcer    Pneumonia    PUD, HX OF 04/21/2008   Pulmonary emboli ([place]) 2021   noted on CT 12-2019   Renal disorder    Stage 3 chronic kidney disease (HCC)    Thyroid nodule    Past Surgical History:  Procedure Laterality Date   BACK SURGERY     bone cancer  1970   rt femur removed and steel rod placed   CYSTOSCOPY WITH RETROGRADE PYELOGRAM, URETEROSCOPY AND STENT PLACEMENT Right 11/06/2017   Procedure: CYSTOSCOPY WITH  STENT PLACEMENT RIGHT;  Surgeon: Raynelle Bring, MD;  Location: [facility];  Service: Urology;  Laterality: Right;   CYSTOSCOPY WITH RETROGRADE PYELOGRAM, URETEROSCOPY AND STENT PLACEMENT Left 06/09/2019    Procedure: CYSTOSCOPY WITH RETROGRADE PYELOGRAM, URETEROSCOPY AND STENT PLACEMENT X2;  Surgeon: [name], MD;  Location: [facility];  Service: Urology;  Laterality: Left;   CYSTOSCOPY WITH RETROGRADE PYELOGRAM, URETEROSCOPY AND STENT PLACEMENT Bilateral 03/01/2020   Procedure: CYSTOSCOPY WITH RETROGRADE PYELOGRAM, URETEROSCOPY AND STENT PLACEMENT;  Surgeon: [name], MD;  Location: [facility];  Service: Urology;  Laterality: Bilateral;  75 MINS   CYSTOSCOPY/RETROGRADE/URETEROSCOPY     1 year ago at [place] in [place] Right 11/24/2017   Procedure: CYSTOSCOPY/URETEROSCOPY/ RETROGRADE/STENT REMOVAL;  Surgeon: Raynelle Bring, MD;  Location: [facility];  Service: Urology;  Laterality: Right;   HOLMIUM LASER APPLICATION Bilateral 04/30/7781   Procedure: HOLMIUM LASER APPLICATION;  Surgeon: [name], MD;  Location: [facility];  Service: Urology;  Laterality: Bilateral;   prosatectomy     Patient Active Problem List   Diagnosis Date Noted   Basal cell carcinoma of scalp 09/16/2022   Deposits (accretions) on teeth 09/16/2022   Dry eye syndrome of bilateral lacrimal glands 09/16/2022   Person encountering health services to consult on behalf of another person 09/16/2022  Encounter for issue of repeat prescription 09/16/2022   Encounter for fitting and adjustment of hearing aid 09/16/2022   Irreversible pulpitis 09/16/2022   Nontoxic single thyroid nodule 09/16/2022   Other chronic pain 09/16/2022   Pain in right hand 09/16/2022   Personal history of other venous thrombosis and embolism 09/16/2022   Hypertension 09/16/2022   Thyroid nodule 09/16/2022   Sensorineural hearing loss, bilateral 09/16/2022   Post-COVID syndrome 09/16/2022   Chronic pansinusitis 07/11/2022   Nasal obstruction 07/11/2022   Nasal polyposis 06/11/2022   Non-seasonal allergic rhinitis 06/11/2022   Abrasion of right arm 11/19/2021   Acute blood loss anemia 10/26/2021   PUD (peptic  ulcer disease) 10/26/2021   Acute post-operative pain 10/23/2021   Femur fracture, right ([place]) 10/17/2021   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[place]) 10/16/2021   History of pulmonary embolism 10/10/2021   HTN (hypertension) 10/10/2021   Chronic diastolic CHF (congestive heart failure) ([place]) 10/10/2021   GERD (gastroesophageal reflux disease) 10/10/2021   CKD (chronic kidney disease), stage III ([place]) 10/10/2021   Hyperlipidemia 10/10/2021   CAD (coronary artery disease) 10/10/2021   Periprosthetic fracture of shaft of femur 10/10/2021   Other fatigue 06/05/2021   CAD (coronary artery disease) 54/27/0623   Diastolic CHF ([place]) 76/28/3151   SOB (shortness of breath) on exertion 03/26/2021   Chronic anticoagulation 03/26/2021   Anemia in chronic kidney disease 01/25/2021   Acquired coagulation disorder ([place]) 12/12/2020   Senile purpura ([place]) 12/12/2020   S/P lumbar fusion 08/03/2020   Aftercare following surgery 08/03/2020   Thin skin 07/25/2020   Age-related osteoporosis without current pathological fracture 07/11/2020   Encounter for immunization 07/11/2020   Other specified counseling 07/11/2020   Malignant neoplasm of bone and articular cartilage ([place]) 07/11/2020   Malignant neoplasm of skin 07/11/2020   Meibomian gland dysfunction of right eye, unspecified eyelid 07/11/2020   Nuclear senile cataract 07/11/2020   Vitamin B12 deficient megaloblastic anemia 07/11/2020   Right lower quadrant pain 07/11/2020   Pyelonephritis 07/11/2020   Aortic atherosclerosis ([place]) 05/17/2020   B12 deficiency 05/17/2020   Iliac artery aneurysm ([place]) 05/17/2020   Osteoporosis 04/04/2020   Idiopathic neuropathy 01/28/2020   Recurrent falls 01/28/2020   Chronic kidney disease (CKD), stage III (moderate) ([place]) 01/19/2020   Pulmonary emboli ([place]) 01/14/2020   GERD (gastroesophageal reflux disease) 12/22/2019   Essential tremor 12/22/2019   Femoral condyle fracture ([place]) 07/15/2019   Acute pain of right knee  07/15/2019   Gross hematuria 06/14/2019   Hyperlipidemia 11/11/2017   Kidney stone 11/05/2017   Community acquired pneumonia of right upper lobe of lung 11/05/2017   Right ureteral stone 11/05/2017   History of total right hip replacement 03/08/2017   History of prostate cancer 03/08/2017   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   Vertigo 03/01/2017   Right flank discomfort 03/01/2017   Prerenal renal failure 03/01/2017   Personal history of prostate cancer 02/28/2017   High risk medication use 02/28/2017   DDD (degenerative disc disease), thoracic 02/28/2017   Syncope and collapse 02/28/2017   Ataxia 05/18/2016   Vestibular disequilibrium 05/18/2016   Failed spinal cord stimulator ([place]) 03/20/2016   Lung nodule, solitary 05/24/2014   Lumbosacral stenosis 06/05/2013   Malignant neoplasm of prostate ([place]) 06/05/2013   Peripheral edema 06/08/2012   Subcortical microvascular ischemic occlusive disease 08/13/2010   Backache 04/21/2008   History of peptic ulcer disease 04/21/2008   Essential hypertension 10/29/2007  REFERRING PROVIDER: [name], MD   REFERRING DIAG:  M54.50,G89.29 (ICD-10-CM) - Chronic bilateral low back  pain without sciatica  R26.89 (ICD-10-CM) - Other abnormalities of gait and mobility      THERAPY DIAG:  Pain in right hip   Pain in right leg   Difficulty in walking, not elsewhere classified   Muscle weakness (generalized)   ONSET DATE: October 10, 2021 recent fall causing fracture  SUBJECTIVE:                                                                                                                                  SUBJECTIVE STATEMENT: Pt reports he started medicine yesterday (inhaler, prednisone, and antibiotic).  "Today is the best I have felt since getting Covid".    PERTINENT HISTORY:  Stenosis, Rt THA 49 yr ago with h/o periprosthetic fx & revision in 2022, h/o back surgery  Hard of hearing; can hear out of Left ear   PAIN:  Are you  having pain: Yes 0/10 Pain location:   Description:  Aggravating factors: Rt WB, walking Relieving factors: rest   PRECAUTIONS: Posterior hip   WEIGHT BEARING RESTRICTIONS No   FALLS:  Has patient fallen in last 6 months? Yes, Number of falls: 1 4/21: no falls recently 6/9: denies new falls 7/7: denies falls 8/7: denies falls 9/15: 3 falls     PATIENT GOALS build up strength, get rid of assistance for toileting and getting in/out of bed, no AD in gait, stairs, Son's wedding in [place] first of June     OBJECTIVE: *All objective findings taken at [facility] unless otherwise noted.    PATIENT SURVEYS:  LEFS 39 4/21: 46 5/19: 43 6/9: 43 7/7: 43  0 on walking 2 blocks, 1 mile & stiars- would like to see these improve in particular 8/7: 42  Still 0/4 on prev stated items 9/15 unchanged from 8/7     COGNITION:          Overall cognitive status: Within functional limits for tasks assessed                        SENSATION:          Bil hand/feet neuropathy unchanged since evaluation      LE MMT:  6/9 measured with handheld dynamometry Normative values: hip flexion 38 lb, knee extension 79 lb   MMT Right 03/06/2022 Right/Left 04/19/22 Rt/Lt 05/17/22 Rt/Lt 06/07/22 Rt//Lt 07/05/22 Rt//Lt 07/26/22 Rt//Lt 9/15  Hip flexion 4/5 11.1/ 24.3 20.9/42.6 34.8/44.2 38.9//42.1  20.1//37.3  Knee flexion 3/5 32.3/37.9 37.7/55.3 38.7/48.6 42.2//54.9    Knee extension 3/5 12.1/42.4 20.7/60.7 25.8/50.5 24.9//50.6 28.2//66.2 22.9//43.5   (Blank rows = not tested)  6/23:unable to lift RtLE against gravity in sidelying  8/7: able to lift knee in clam independently but requires assistance for sidelying straight leg hip abduction       FUNCTIONAL TESTS:  5 times sit to stand: 38s 4/21: 18s without use of UEs 6/9:16s without use of UEs 8/7: 14s with  UEs, too much groin pain without UEs 9/15: 20s with UE for last 4- balance felt off more than groin pain Berg Balance Scale: 33/56 4/21:  40/50 6/9: 44/50    GAIT:   Comments: SPC in Lt hand, rt leg abducted and externally rotated, antalgic on Rt with pressure placed through cane 4/21: SPC in Lt hand, Rt leg no longer abd /ER; does demo Rt trunk SB in Rt stance phase 6/9: SPC in Lt hand, mild Rt trunk SB in Rt stance phase, maintains upright posture 7/7: with SPC: maint upright posture wihtout Rt sidebend in stance phase; without SPC- cont upright posture and good swing through but does present sidebend to the Rt in Rt stance phase 8/7: with SPC, upright mosture with minimal deviation from midline and minimal use of cane for assistance, antalgic without cane and flexes trunk rather than rotates.  9/15:notbale  maintenance of midline through trunk with ER and abd of LLE         TODAY'S TREATMENT  Pt seen for aquatic therapy today.  Treatment took place in water 3.25-4.75 ft in depth at the [facility]. Temp of water was 93.  Pt entered/exited the pool via stairs independently with step to pattern and bilat rail.               In water >4 ft deep unsupported:               * Warm up of forward, backward gait and side stepping  * monster walk forward/ backward   * holding wall:  hip abdct/add x 10; hip ext x 10; hip circles CW/ CCW; heel raises; squats;  split squats   * high knee marching forward/ backward with rainbow hand buoys under water  * holding yellow noodle:  SLS x 15 sec each; SLS with 3 way toe tap x 5 reps each; SLS with clam leg x 5 each  * holding wall:  curtsy lunges   * return to walking forward/ backward without support with increased speed  * Quad stretch and Hamstring stretch at stairs holding rail R/L; bilat calf stretch   Pt requires the buoyancy and hydrostatic pressure of water for support, and to offload joints by unweighting joint load by at least 50 % in navel deep water and by at least 75-80% in chest to neck deep water.  Viscosity of the water is needed for resistance of  strengthening. Water current perturbations provides challenge to standing balance requiring increased core activation.    PATIENT EDUCATION:  Education details: Geophysicist/field seismologist of condition, POC, HEP, exercise form/rationale  Person educated: Patient and Spouse Education method: Explanation, Demonstration, Tactile cues, and Verbal cues Education comprehension: verbalized understanding, returned demonstration, verbal cues required, tactile cues required, and needs further education     HOME EXERCISE PROGRAM: [id number]   ASSESSMENT:   CLINICAL IMPRESSION: Pt has been away from aquatic therapy for almost 1 month. Returned to basic set of exercises and monitored pt for pain/ fatigue/ SOB. Pt remained pain free throughout session.  Per supervising PT,  will do 9 more sessions of PT and then plan to transition to community workout program.   OBJECTIVE IMPAIRMENTS Abnormal gait, decreased activity tolerance, decreased balance, difficulty walking, decreased strength, impaired sensation, improper body mechanics, postural dysfunction, and pain.    ACTIVITY LIMITATIONS cleaning, community activity, driving, meal prep, laundry, and shopping.    PERSONAL FACTORS  see problem list  are also affecting patient's functional outcome.  REHAB POTENTIAL: Good   CLINICAL DECISION MAKING: Unstable/unpredictable   EVALUATION COMPLEXITY: High     GOALS: Goals reviewed with patient? Yes   SHORT TERM GOALS:   Able to enter/exit pool independently with use of hand rails Baseline: will begin training Target date: 04/03/2022 Goal status: Achieved    2.  Good tolerance to exercise without limitations in next day due to pain Baseline: Target date: 04/03/2022 Goal status: achieved    3.  Able to shift weight to perform step taps with UE assist Baseline: unable at eval Target date: 04/03/2022 Goal status: Achieved    4.  Able to ambulate with centered weight distribution Baseline: able with [facility] Target date:  04/03/2022 Goal status: achieved       LONG TERM GOALS:   BERG to improve by MDC of 6 points Baseline: 33 at eval Target date:  07/06/22 Goal status: achieved   2.  Pt will ambulate safely around his home wihtout use of AD Baseline: achieved Goal status: ongoing   3.  Able to navigate stairs in home safely and confidently Baseline:navigating stairs with "up with the good, down with the bad" pattern  Goal status: ongoing   4.  LEFS to improve by MDC Baseline: MDC is 9 points  Goal status: INITIAL   5.  Pt will demo 5TSTS in 20s or less Baseline: 38s at eval  Goal status: Achieved - 04/19/22   6.  Pt will be able to rid home of aid devices Baseline: taken toilet seats out, uses stool in shower but for convenience, not need; still using walker to carry things  Goal status: partially met  7. Start walking around the neighborhood, ultimately without AD  Baseline: walking but with cane.     Goal status: ongoing   PLAN: PT FREQUENCY: 1-2x/week   PT DURATION: other: 4 months   PLANNED INTERVENTIONS: Therapeutic exercises, Therapeutic activity, Neuromuscular re-education, Balance training, Gait training, Patient/Family education, Joint mobilization, Stair training, Aquatic Therapy, Electrical stimulation, Cryotherapy, Moist heat, Taping, and Manual therapy   PLAN FOR NEXT SESSION: Rt quad activation, continue CKC exercises focusing on midline weight distribution/shifting Rt  Kerin Perna, PTA 09/18/22 11:48 AM [place] [address] [place], [place], [address] Phone: [phone]   Fax:  [phone]

## 2022-09-19 NOTE — Progress Notes (Signed)
Please check on patient and see how he is doing He was quite ill when he came to visit recently. See if he has been checking his blood pressure, oxygen levels and heart rate. See if he is having any trouble breathing If anything is worsening I do recommend returning to ER or coming back in to see me, depending on how bad.

## 2022-09-20 ENCOUNTER — Encounter (HOSPITAL_BASED_OUTPATIENT_CLINIC_OR_DEPARTMENT_OTHER): Payer: Self-pay | Admitting: Physical Therapy

## 2022-09-20 ENCOUNTER — Ambulatory Visit (HOSPITAL_COMMUNITY): Payer: Medicare Other | Attending: Physician Assistant

## 2022-09-20 ENCOUNTER — Ambulatory Visit (HOSPITAL_BASED_OUTPATIENT_CLINIC_OR_DEPARTMENT_OTHER): Payer: Medicare Other | Admitting: Physical Therapy

## 2022-09-20 DIAGNOSIS — M25551 Pain in right hip: Secondary | ICD-10-CM

## 2022-09-20 DIAGNOSIS — I2584 Coronary atherosclerosis due to calcified coronary lesion: Secondary | ICD-10-CM | POA: Diagnosis not present

## 2022-09-20 DIAGNOSIS — I10 Essential (primary) hypertension: Secondary | ICD-10-CM | POA: Insufficient documentation

## 2022-09-20 DIAGNOSIS — M79604 Pain in right leg: Secondary | ICD-10-CM

## 2022-09-20 DIAGNOSIS — R0609 Other forms of dyspnea: Secondary | ICD-10-CM | POA: Insufficient documentation

## 2022-09-20 DIAGNOSIS — R262 Difficulty in walking, not elsewhere classified: Secondary | ICD-10-CM | POA: Diagnosis not present

## 2022-09-20 DIAGNOSIS — M6281 Muscle weakness (generalized): Secondary | ICD-10-CM

## 2022-09-20 DIAGNOSIS — I77810 Thoracic aortic ectasia: Secondary | ICD-10-CM | POA: Insufficient documentation

## 2022-09-20 DIAGNOSIS — I5032 Chronic diastolic (congestive) heart failure: Secondary | ICD-10-CM | POA: Insufficient documentation

## 2022-09-20 DIAGNOSIS — I2510 Atherosclerotic heart disease of native coronary artery without angina pectoris: Secondary | ICD-10-CM | POA: Insufficient documentation

## 2022-09-20 NOTE — Therapy (Signed)
OUTPATIENT PHYSICAL THERAPY TREATMENT NOTE     Patient Name: Bruce Ball MRN: [id number] DOB:11-19-1946, 76 y.o., male Today's Date: 09/20/2022  PCP: [name], MD REFERRING PROVIDER: [name], MD   PT End of Session - 09/20/22 1111     Visit Number 42    Number of Visits 28    Date for PT Re-Evaluation 10/25/22    Authorization Type MCR    Progress Note Due on Visit 25    PT Start Time 1107    PT Stop Time 1143    PT Time Calculation (min) 36 min    Behavior During Therapy [facility] for tasks assessed/performed                    Past Medical History:  Diagnosis Date   Ankylosing spondylitis ([place])    Arthritis of right knee    Basal cell carcinoma    Chronic back pain greater than 3 months duration    Chronic leg pain    CKD (chronic kidney disease), stage II    Coronary artery calcification seen on CAT scan 03/27/2021   Deafness in right ear    Dyspnea    cause by PE   Essential tremor    Extrinsic asthma, unspecified 04/07/2009   PT DENIES   GERD (gastroesophageal reflux disease)    Hearing loss    Hip problem    History of kidney stones    HYPERTENSION 10/29/2007   HYPOTENSION 08/13/2010   LOW BACK PAIN 04/21/2008   NEPHROLITHIASIS, HX OF 04/21/2008   Neuropathy    Osteopenia    Other hyperlipidemia    Peptic ulcer    Pneumonia    PUD, HX OF 04/21/2008   Pulmonary emboli ([place]) 2021   noted on CT 12-2019   Renal disorder    Stage 3 chronic kidney disease (HCC)    Thyroid nodule    Past Surgical History:  Procedure Laterality Date   BACK SURGERY     bone cancer  1970   rt femur removed and steel rod placed   CYSTOSCOPY WITH RETROGRADE PYELOGRAM, URETEROSCOPY AND STENT PLACEMENT Right 11/06/2017   Procedure: CYSTOSCOPY WITH  STENT PLACEMENT RIGHT;  Surgeon: Raynelle Bring, MD;  Location: [facility];  Service: Urology;  Laterality: Right;   CYSTOSCOPY WITH RETROGRADE PYELOGRAM, URETEROSCOPY AND STENT PLACEMENT Left 06/09/2019    Procedure: CYSTOSCOPY WITH RETROGRADE PYELOGRAM, URETEROSCOPY AND STENT PLACEMENT X2;  Surgeon: [name], MD;  Location: [facility];  Service: Urology;  Laterality: Left;   CYSTOSCOPY WITH RETROGRADE PYELOGRAM, URETEROSCOPY AND STENT PLACEMENT Bilateral 03/01/2020   Procedure: CYSTOSCOPY WITH RETROGRADE PYELOGRAM, URETEROSCOPY AND STENT PLACEMENT;  Surgeon: [name], MD;  Location: [facility];  Service: Urology;  Laterality: Bilateral;  75 MINS   CYSTOSCOPY/RETROGRADE/URETEROSCOPY     1 year ago at [place] in [place] Right 11/24/2017   Procedure: CYSTOSCOPY/URETEROSCOPY/ RETROGRADE/STENT REMOVAL;  Surgeon: Raynelle Bring, MD;  Location: [facility];  Service: Urology;  Laterality: Right;   HOLMIUM LASER APPLICATION Bilateral 09/01/7901   Procedure: HOLMIUM LASER APPLICATION;  Surgeon: [name], MD;  Location: [facility];  Service: Urology;  Laterality: Bilateral;   prosatectomy     Patient Active Problem List   Diagnosis Date Noted   Basal cell carcinoma of scalp 09/16/2022   Deposits (accretions) on teeth 09/16/2022   Dry eye syndrome of bilateral lacrimal glands 09/16/2022   Person encountering health services to consult on behalf of another person 09/16/2022  Encounter for issue of repeat prescription 09/16/2022   Encounter for fitting and adjustment of hearing aid 09/16/2022   Irreversible pulpitis 09/16/2022   Nontoxic single thyroid nodule 09/16/2022   Other chronic pain 09/16/2022   Pain in right hand 09/16/2022   Personal history of other venous thrombosis and embolism 09/16/2022   Hypertension 09/16/2022   Thyroid nodule 09/16/2022   Sensorineural hearing loss, bilateral 09/16/2022   Post-COVID syndrome 09/16/2022   Chronic pansinusitis 07/11/2022   Nasal obstruction 07/11/2022   Nasal polyposis 06/11/2022   Non-seasonal allergic rhinitis 06/11/2022   Abrasion of right arm 11/19/2021   Acute blood loss anemia 10/26/2021   PUD (peptic  ulcer disease) 10/26/2021   Acute post-operative pain 10/23/2021   Femur fracture, right ([place]) 10/17/2021   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[place]) 10/16/2021   History of pulmonary embolism 10/10/2021   HTN (hypertension) 10/10/2021   Chronic diastolic CHF (congestive heart failure) ([place]) 10/10/2021   GERD (gastroesophageal reflux disease) 10/10/2021   CKD (chronic kidney disease), stage III ([place]) 10/10/2021   Hyperlipidemia 10/10/2021   CAD (coronary artery disease) 10/10/2021   Periprosthetic fracture of shaft of femur 10/10/2021   Other fatigue 06/05/2021   CAD (coronary artery disease) 54/27/0623   Diastolic CHF ([place]) 76/28/3151   SOB (shortness of breath) on exertion 03/26/2021   Chronic anticoagulation 03/26/2021   Anemia in chronic kidney disease 01/25/2021   Acquired coagulation disorder ([place]) 12/12/2020   Senile purpura ([place]) 12/12/2020   S/P lumbar fusion 08/03/2020   Aftercare following surgery 08/03/2020   Thin skin 07/25/2020   Age-related osteoporosis without current pathological fracture 07/11/2020   Encounter for immunization 07/11/2020   Other specified counseling 07/11/2020   Malignant neoplasm of bone and articular cartilage ([place]) 07/11/2020   Malignant neoplasm of skin 07/11/2020   Meibomian gland dysfunction of right eye, unspecified eyelid 07/11/2020   Nuclear senile cataract 07/11/2020   Vitamin B12 deficient megaloblastic anemia 07/11/2020   Right lower quadrant pain 07/11/2020   Pyelonephritis 07/11/2020   Aortic atherosclerosis ([place]) 05/17/2020   B12 deficiency 05/17/2020   Iliac artery aneurysm ([place]) 05/17/2020   Osteoporosis 04/04/2020   Idiopathic neuropathy 01/28/2020   Recurrent falls 01/28/2020   Chronic kidney disease (CKD), stage III (moderate) ([place]) 01/19/2020   Pulmonary emboli ([place]) 01/14/2020   GERD (gastroesophageal reflux disease) 12/22/2019   Essential tremor 12/22/2019   Femoral condyle fracture ([place]) 07/15/2019   Acute pain of right knee  07/15/2019   Gross hematuria 06/14/2019   Hyperlipidemia 11/11/2017   Kidney stone 11/05/2017   Community acquired pneumonia of right upper lobe of lung 11/05/2017   Right ureteral stone 11/05/2017   History of total right hip replacement 03/08/2017   History of prostate cancer 03/08/2017   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   Vertigo 03/01/2017   Right flank discomfort 03/01/2017   Prerenal renal failure 03/01/2017   Personal history of prostate cancer 02/28/2017   High risk medication use 02/28/2017   DDD (degenerative disc disease), thoracic 02/28/2017   Syncope and collapse 02/28/2017   Ataxia 05/18/2016   Vestibular disequilibrium 05/18/2016   Failed spinal cord stimulator ([place]) 03/20/2016   Lung nodule, solitary 05/24/2014   Lumbosacral stenosis 06/05/2013   Malignant neoplasm of prostate ([place]) 06/05/2013   Peripheral edema 06/08/2012   Subcortical microvascular ischemic occlusive disease 08/13/2010   Backache 04/21/2008   History of peptic ulcer disease 04/21/2008   Essential hypertension 10/29/2007  REFERRING PROVIDER: [name], MD   REFERRING DIAG:  M54.50,G89.29 (ICD-10-CM) - Chronic bilateral low back  pain without sciatica  R26.89 (ICD-10-CM) - Other abnormalities of gait and mobility      THERAPY DIAG:  Pain in right hip   Pain in right leg   Difficulty in walking, not elsewhere classified   Muscle weakness (generalized)   ONSET DATE: October 10, 2021 recent fall causing fracture  SUBJECTIVE:                                                                                                                                  SUBJECTIVE STATEMENT: No pain and no sharp pain since starting prednisone.    PERTINENT HISTORY:  Stenosis, Rt THA 64 yr ago with h/o periprosthetic fx & revision in 2022, h/o back surgery  Hard of hearing; can hear out of Left ear   PAIN:  Are you having pain: Yes 0/10 Pain location:   Description:  Aggravating factors: Rt  WB, walking Relieving factors: rest   PRECAUTIONS: Posterior hip   WEIGHT BEARING RESTRICTIONS No   FALLS:  Has patient fallen in last 6 months? Yes, Number of falls: 1 4/21: no falls recently 6/9: denies new falls 7/7: denies falls 8/7: denies falls 9/15: 3 falls     PATIENT GOALS build up strength, get rid of assistance for toileting and getting in/out of bed, no AD in gait, stairs, Son's wedding in [place] first of June     OBJECTIVE: *All objective findings taken [facility] unless otherwise noted.    PATIENT SURVEYS:  LEFS 39 4/21: 46 5/19: 43 6/9: 43 7/7: 43  0 on walking 2 blocks, 1 mile & stiars- would like to see these improve in particular 8/7: 42  Still 0/4 on prev stated items 9/15 unchanged from 8/7     COGNITION:          Overall cognitive status: Within functional limits for tasks assessed                        SENSATION:          Bil hand/feet neuropathy unchanged since evaluation      LE MMT:  6/9 measured with handheld dynamometry Normative values: hip flexion 38 lb, knee extension 79 lb   MMT Right 03/06/2022 Right/Left 04/19/22 Rt/Lt 05/17/22 Rt/Lt 06/07/22 Rt//Lt 07/05/22 Rt//Lt 07/26/22 Rt//Lt 9/15  Hip flexion 4/5 11.1/ 24.3 20.9/42.6 34.8/44.2 38.9//42.1  20.1//37.3  Knee flexion 3/5 32.3/37.9 37.7/55.3 38.7/48.6 42.2//54.9    Knee extension 3/5 12.1/42.4 20.7/60.7 25.8/50.5 24.9//50.6 28.2//66.2 22.9//43.5   (Blank rows = not tested)  6/23:unable to lift RtLE against gravity in sidelying  8/7: able to lift knee in clam independently but requires assistance for sidelying straight leg hip abduction       FUNCTIONAL TESTS:  5 times sit to stand: 38s 4/21: 18s without use of UEs 6/9:16s without use of UEs 8/7: 14s with UEs, too much groin pain without UEs 9/15: 20s with UE for  last 4- balance felt off more than groin pain Berg Balance Scale: 33/56 4/21: 40/50 6/9: 44/50    GAIT:   Comments: SPC in Lt hand, rt leg abducted and  externally rotated, antalgic on Rt with pressure placed through cane 4/21: SPC in Lt hand, Rt leg no longer abd /ER; does demo Rt trunk SB in Rt stance phase 6/9: SPC in Lt hand, mild Rt trunk SB in Rt stance phase, maintains upright posture 7/7: with SPC: maint upright posture wihtout Rt sidebend in stance phase; without SPC- cont upright posture and good swing through but does present sidebend to the Rt in Rt stance phase 8/7: with SPC, upright mosture with minimal deviation from midline and minimal use of cane for assistance, antalgic without cane and flexes trunk rather than rotates.  9/15:notbale  maintenance of midline through trunk with ER and abd of LLE         TODAY'S TREATMENT  9/22: Nu step 5 min L6 Sidelying clams Sidelying hip abd- acitve assisted 3*10 Partial stand and pause 3*10 Seated clams red tband LAQ red tband around bil ankles At bar: side stepping, squat pull offs     PATIENT EDUCATION:  Education details: Geophysicist/field seismologist of condition, POC, HEP, exercise form/rationale  Person educated: Patient and Spouse Education method: Explanation, Demonstration, Tactile cues, and Verbal cues Education comprehension: verbalized understanding, returned demonstration, verbal cues required, tactile cues required, and needs further education     HOME EXERCISE PROGRAM: [id number]   ASSESSMENT:   CLINICAL IMPRESSION: Overall pt tolerated exercises very well without an increase in pain but did report fatigue. Today was his last dose of prednisone so we will monitor changes following that completion. With the pain reduced, he was able to verbalize feeling contraction of hip abductors and gluts rather than sharp lateral hip pain.   OBJECTIVE IMPAIRMENTS Abnormal gait, decreased activity tolerance, decreased balance, difficulty walking, decreased strength, impaired sensation, improper body mechanics, postural dysfunction, and pain.    ACTIVITY LIMITATIONS cleaning, community activity,  driving, meal prep, laundry, and shopping.    PERSONAL FACTORS  see problem list  are also affecting patient's functional outcome.      REHAB POTENTIAL: Good   CLINICAL DECISION MAKING: Unstable/unpredictable   EVALUATION COMPLEXITY: High     GOALS: Goals reviewed with patient? Yes   SHORT TERM GOALS:   Able to enter/exit pool independently with use of hand rails Baseline: will begin training Target date: 04/03/2022 Goal status: Achieved    2.  Good tolerance to exercise without limitations in next day due to pain Baseline: Target date: 04/03/2022 Goal status: achieved    3.  Able to shift weight to perform step taps with UE assist Baseline: unable at eval Target date: 04/03/2022 Goal status: Achieved    4.  Able to ambulate with centered weight distribution Baseline: able with [facility] Target date: 04/03/2022 Goal status: achieved       LONG TERM GOALS:   BERG to improve by MDC of 6 points Baseline: 33 at eval Target date:  07/06/22 Goal status: achieved   2.  Pt will ambulate safely around his home wihtout use of AD Baseline: achieved Goal status: ongoing   3.  Able to navigate stairs in home safely and confidently Baseline:navigating stairs with "up with the good, down with the bad" pattern  Goal status: ongoing   4.  LEFS to improve by MDC Baseline: MDC is 9 points  Goal status: INITIAL   5.  Pt will demo 5TSTS  in 20s or less Baseline: 38s at eval  Goal status: Achieved - 04/19/22   6.  Pt will be able to rid home of aid devices Baseline: taken toilet seats out, uses stool in shower but for convenience, not need; still using walker to carry things  Goal status: partially met  7. Start walking around the neighborhood, ultimately without AD  Baseline: walking but with cane.     Goal status: ongoing   PLAN: PT FREQUENCY: 1-2x/week   PT DURATION: other: 4 months   PLANNED INTERVENTIONS: Therapeutic exercises, Therapeutic activity, Neuromuscular  re-education, Balance training, Gait training, Patient/Family education, Joint mobilization, Stair training, Aquatic Therapy, Electrical stimulation, Cryotherapy, Moist heat, Taping, and Manual therapy   PLAN FOR NEXT SESSION: gross large muscle strengthening in lower body  Lisa Milian C. Alfonso Carden PT, DPT 09/20/22 11:50 AM

## 2022-09-23 ENCOUNTER — Encounter: Payer: Self-pay | Admitting: *Deleted

## 2022-09-24 ENCOUNTER — Other Ambulatory Visit: Payer: Self-pay | Admitting: Physician Assistant

## 2022-09-24 DIAGNOSIS — R0609 Other forms of dyspnea: Secondary | ICD-10-CM

## 2022-09-24 LAB — ECHOCARDIOGRAM COMPLETE
Area-P 1/2: 4.06 cm2
S' Lateral: 3 cm

## 2022-09-25 ENCOUNTER — Encounter (HOSPITAL_BASED_OUTPATIENT_CLINIC_OR_DEPARTMENT_OTHER): Payer: Self-pay | Admitting: Physical Therapy

## 2022-09-25 ENCOUNTER — Ambulatory Visit (HOSPITAL_BASED_OUTPATIENT_CLINIC_OR_DEPARTMENT_OTHER): Payer: Medicare Other | Admitting: Physical Therapy

## 2022-09-25 DIAGNOSIS — M6281 Muscle weakness (generalized): Secondary | ICD-10-CM | POA: Diagnosis not present

## 2022-09-25 DIAGNOSIS — M79604 Pain in right leg: Secondary | ICD-10-CM

## 2022-09-25 DIAGNOSIS — M25551 Pain in right hip: Secondary | ICD-10-CM

## 2022-09-25 DIAGNOSIS — R0609 Other forms of dyspnea: Secondary | ICD-10-CM | POA: Diagnosis not present

## 2022-09-25 DIAGNOSIS — R262 Difficulty in walking, not elsewhere classified: Secondary | ICD-10-CM

## 2022-09-25 DIAGNOSIS — I10 Essential (primary) hypertension: Secondary | ICD-10-CM | POA: Diagnosis not present

## 2022-09-25 NOTE — Therapy (Signed)
OUTPATIENT PHYSICAL THERAPY TREATMENT NOTE     Patient Name: Bruce Ball MRN: [id number] DOB:03-Jul-1946, 76 y.o., male Today's Date: 09/25/2022  PCP: [name], MD REFERRING PROVIDER: [name], MD   PT End of Session - 09/25/22 1112     Visit Number 43    Number of Visits 61    Date for PT Re-Evaluation 10/25/22    Authorization Type MCR    Progress Note Due on Visit 65    PT Start Time 1114    PT Stop Time 1154    PT Time Calculation (min) 40 min    Behavior During Therapy [facility] for tasks assessed/performed                    Past Medical History:  Diagnosis Date   Ankylosing spondylitis ([place])    Arthritis of right knee    Basal cell carcinoma    Chronic back pain greater than 3 months duration    Chronic leg pain    CKD (chronic kidney disease), stage II    Coronary artery calcification seen on CAT scan 03/27/2021   Deafness in right ear    Dyspnea    cause by PE   Essential tremor    Extrinsic asthma, unspecified 04/07/2009   PT DENIES   GERD (gastroesophageal reflux disease)    Hearing loss    Hip problem    History of kidney stones    HYPERTENSION 10/29/2007   HYPOTENSION 08/13/2010   LOW BACK PAIN 04/21/2008   NEPHROLITHIASIS, HX OF 04/21/2008   Neuropathy    Osteopenia    Other hyperlipidemia    Peptic ulcer    Pneumonia    PUD, HX OF 04/21/2008   Pulmonary emboli ([place]) 2021   noted on CT 12-2019   Renal disorder    Stage 3 chronic kidney disease (HCC)    Thyroid nodule    Past Surgical History:  Procedure Laterality Date   BACK SURGERY     bone cancer  1970   rt femur removed and steel rod placed   CYSTOSCOPY WITH RETROGRADE PYELOGRAM, URETEROSCOPY AND STENT PLACEMENT Right 11/06/2017   Procedure: CYSTOSCOPY WITH  STENT PLACEMENT RIGHT;  Surgeon: Raynelle Bring, MD;  Location: [facility];  Service: Urology;  Laterality: Right;   CYSTOSCOPY WITH RETROGRADE PYELOGRAM, URETEROSCOPY AND STENT PLACEMENT Left 06/09/2019    Procedure: CYSTOSCOPY WITH RETROGRADE PYELOGRAM, URETEROSCOPY AND STENT PLACEMENT X2;  Surgeon: [name], MD;  Location: [facility];  Service: Urology;  Laterality: Left;   CYSTOSCOPY WITH RETROGRADE PYELOGRAM, URETEROSCOPY AND STENT PLACEMENT Bilateral 03/01/2020   Procedure: CYSTOSCOPY WITH RETROGRADE PYELOGRAM, URETEROSCOPY AND STENT PLACEMENT;  Surgeon: [name], MD;  Location: [facility];  Service: Urology;  Laterality: Bilateral;  75 MINS   CYSTOSCOPY/RETROGRADE/URETEROSCOPY     1 year ago at [place] in [place] Right 11/24/2017   Procedure: CYSTOSCOPY/URETEROSCOPY/ RETROGRADE/STENT REMOVAL;  Surgeon: Raynelle Bring, MD;  Location: [facility];  Service: Urology;  Laterality: Right;   HOLMIUM LASER APPLICATION Bilateral 03/30/2877   Procedure: HOLMIUM LASER APPLICATION;  Surgeon: [name], MD;  Location: [facility];  Service: Urology;  Laterality: Bilateral;   prosatectomy     Patient Active Problem List   Diagnosis Date Noted   Basal cell carcinoma of scalp 09/16/2022   Deposits (accretions) on teeth 09/16/2022   Dry eye syndrome of bilateral lacrimal glands 09/16/2022   Person encountering health services to consult on behalf of another person 09/16/2022  Encounter for issue of repeat prescription 09/16/2022   Encounter for fitting and adjustment of hearing aid 09/16/2022   Irreversible pulpitis 09/16/2022   Nontoxic single thyroid nodule 09/16/2022   Other chronic pain 09/16/2022   Pain in right hand 09/16/2022   Personal history of other venous thrombosis and embolism 09/16/2022   Hypertension 09/16/2022   Thyroid nodule 09/16/2022   Sensorineural hearing loss, bilateral 09/16/2022   Post-COVID syndrome 09/16/2022   Chronic pansinusitis 07/11/2022   Nasal obstruction 07/11/2022   Nasal polyposis 06/11/2022   Non-seasonal allergic rhinitis 06/11/2022   Abrasion of right arm 11/19/2021   Acute blood loss anemia 10/26/2021   PUD (peptic  ulcer disease) 10/26/2021   Acute post-operative pain 10/23/2021   Femur fracture, right ([place]) 10/17/2021   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[place]) 10/16/2021   History of pulmonary embolism 10/10/2021   HTN (hypertension) 10/10/2021   Chronic diastolic CHF (congestive heart failure) ([place]) 10/10/2021   GERD (gastroesophageal reflux disease) 10/10/2021   CKD (chronic kidney disease), stage III ([place]) 10/10/2021   Hyperlipidemia 10/10/2021   CAD (coronary artery disease) 10/10/2021   Periprosthetic fracture of shaft of femur 10/10/2021   Other fatigue 06/05/2021   CAD (coronary artery disease) 54/27/0623   Diastolic CHF ([place]) 76/28/3151   SOB (shortness of breath) on exertion 03/26/2021   Chronic anticoagulation 03/26/2021   Anemia in chronic kidney disease 01/25/2021   Acquired coagulation disorder ([place]) 12/12/2020   Senile purpura ([place]) 12/12/2020   S/P lumbar fusion 08/03/2020   Aftercare following surgery 08/03/2020   Thin skin 07/25/2020   Age-related osteoporosis without current pathological fracture 07/11/2020   Encounter for immunization 07/11/2020   Other specified counseling 07/11/2020   Malignant neoplasm of bone and articular cartilage ([place]) 07/11/2020   Malignant neoplasm of skin 07/11/2020   Meibomian gland dysfunction of right eye, unspecified eyelid 07/11/2020   Nuclear senile cataract 07/11/2020   Vitamin B12 deficient megaloblastic anemia 07/11/2020   Right lower quadrant pain 07/11/2020   Pyelonephritis 07/11/2020   Aortic atherosclerosis ([place]) 05/17/2020   B12 deficiency 05/17/2020   Iliac artery aneurysm ([place]) 05/17/2020   Osteoporosis 04/04/2020   Idiopathic neuropathy 01/28/2020   Recurrent falls 01/28/2020   Chronic kidney disease (CKD), stage III (moderate) ([place]) 01/19/2020   Pulmonary emboli ([place]) 01/14/2020   GERD (gastroesophageal reflux disease) 12/22/2019   Essential tremor 12/22/2019   Femoral condyle fracture ([place]) 07/15/2019   Acute pain of right knee  07/15/2019   Gross hematuria 06/14/2019   Hyperlipidemia 11/11/2017   Kidney stone 11/05/2017   Community acquired pneumonia of right upper lobe of lung 11/05/2017   Right ureteral stone 11/05/2017   History of total right hip replacement 03/08/2017   History of prostate cancer 03/08/2017   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   Vertigo 03/01/2017   Right flank discomfort 03/01/2017   Prerenal renal failure 03/01/2017   Personal history of prostate cancer 02/28/2017   High risk medication use 02/28/2017   DDD (degenerative disc disease), thoracic 02/28/2017   Syncope and collapse 02/28/2017   Ataxia 05/18/2016   Vestibular disequilibrium 05/18/2016   Failed spinal cord stimulator ([place]) 03/20/2016   Lung nodule, solitary 05/24/2014   Lumbosacral stenosis 06/05/2013   Malignant neoplasm of prostate ([place]) 06/05/2013   Peripheral edema 06/08/2012   Subcortical microvascular ischemic occlusive disease 08/13/2010   Backache 04/21/2008   History of peptic ulcer disease 04/21/2008   Essential hypertension 10/29/2007  REFERRING PROVIDER: [name], MD   REFERRING DIAG:  M54.50,G89.29 (ICD-10-CM) - Chronic bilateral low back  pain without sciatica  R26.89 (ICD-10-CM) - Other abnormalities of gait and mobility      THERAPY DIAG:  Pain in right hip   Pain in right leg   Difficulty in walking, not elsewhere classified   Muscle weakness (generalized)   ONSET DATE: October 10, 2021 recent fall causing fracture  SUBJECTIVE:                                                                                                                                  SUBJECTIVE STATEMENT: " I had a real good work out my last PT session".   Pt reports he had his last dose of Prednisone on Friday.  Per pt report, on Monday morning he awoke with more pain and pain has remained elevated since then.  He complains of lack of energy, too.  He would like to work on his stamina today.    PERTINENT  HISTORY:  Stenosis, Rt THA 58 yr ago with h/o periprosthetic fx & revision in 2022, h/o back surgery  Hard of hearing; can hear out of Left ear   PAIN:  Are you having pain: Yes 5/10 Pain location:  Rt groin and lateral hip  Description: sharp/dul Aggravating factors: Rt WB, walking Relieving factors: rest   PRECAUTIONS: Posterior hip   WEIGHT BEARING RESTRICTIONS No   FALLS:  Has patient fallen in last 6 months? Yes, Number of falls: 1 4/21: no falls recently 6/9: denies new falls 7/7: denies falls 8/7: denies falls 9/15: 3 falls     PATIENT GOALS build up strength, get rid of assistance for toileting and getting in/out of bed, no AD in gait, stairs, Son's wedding in [place] first of June     OBJECTIVE: *All objective findings [facility] unless otherwise noted.    PATIENT SURVEYS:  LEFS 39 4/21: 46 5/19: 43 6/9: 43 7/7: 43  0 on walking 2 blocks, 1 mile & stiars- would like to see these improve in particular 8/7: 42  Still 0/4 on prev stated items 9/15 unchanged from 8/7     COGNITION:          Overall cognitive status: Within functional limits for tasks assessed                        SENSATION:          Bil hand/feet neuropathy unchanged since evaluation      LE MMT:  6/9 measured with handheld dynamometry Normative values: hip flexion 38 lb, knee extension 79 lb   MMT Right 03/06/2022 Right/Left 04/19/22 Rt/Lt 05/17/22 Rt/Lt 06/07/22 Rt//Lt 07/05/22 Rt//Lt 07/26/22 Rt//Lt 9/15  Hip flexion 4/5 11.1/ 24.3 20.9/42.6 34.8/44.2 38.9//42.1  20.1//37.3  Knee flexion 3/5 32.3/37.9 37.7/55.3 38.7/48.6 42.2//54.9    Knee extension 3/5 12.1/42.4 20.7/60.7 25.8/50.5 24.9//50.6 28.2//66.2 22.9//43.5   (Blank rows = not tested)  6/23:unable to lift RtLE against gravity in sidelying  8/7: able to lift knee in clam independently but requires assistance for sidelying straight leg hip abduction       FUNCTIONAL TESTS:  5 times sit to stand: 38s 4/21: 18s without  use of UEs 6/9:16s without use of UEs 8/7: 14s with UEs, too much groin pain without UEs 9/15: 20s with UE for last 4- balance felt off more than groin pain Berg Balance Scale: 33/56 4/21: 40/50 6/9: 44/50    GAIT:   Comments: SPC in Lt hand, rt leg abducted and externally rotated, antalgic on Rt with pressure placed through cane 4/21: SPC in Lt hand, Rt leg no longer abd /ER; does demo Rt trunk SB in Rt stance phase 6/9: SPC in Lt hand, mild Rt trunk SB in Rt stance phase, maintains upright posture 7/7: with SPC: maint upright posture wihtout Rt sidebend in stance phase; without SPC- cont upright posture and good swing through but does present sidebend to the Rt in Rt stance phase 8/7: with SPC, upright mosture with minimal deviation from midline and minimal use of cane for assistance, antalgic without cane and flexes trunk rather than rotates.  9/15:notbale  maintenance of midline through trunk with ER and abd of LLE         TODAY'S TREATMENT  9/27: Pt seen for aquatic therapy today.  Treatment took place in water 3.25-4.75 ft in depth at the [facility]. Temp of water was 93.  Pt entered/exited the pool via stairs independently with step to pattern and bilat rail.               In water >4 ft deep unsupported:               * Warm up of forward, backward gait and side stepping  * side stepping with arm add with rainbow hand;  high knee marching forward/ backward with rainbow hand buoys under water             * monster walk forward             * holding wall:  hip abdct/add x 15; hip ext x 12; heel raises; curtsy lunges              * holding yellow hand buoys:  forward walking split squats             * holding yellow noodle:  alternating SLS with 3 way toe tap x 5 reps each;             * at 4 ft, back against wall:  yellow noodle stomp with LLE, thin square noodle with RLE             * return to walking forward/ backward without support with increased speed in  deepest water             * Quad stretch and Hamstring stretch at stairs holding rail R/L; bilat calf stretch    Pt requires the buoyancy and hydrostatic pressure of water for support, and to offload joints by unweighting joint load by at least 50 % in navel deep water and by at least 75-80% in chest to neck deep water.  Viscosity of the water is needed for resistance of strengthening. Water current perturbations provides challenge to standing balance requiring increased core activation.    9/22: Nu step 5 min L6 Sidelying clams Sidelying hip abd- acitve assisted 3*10 Partial stand and pause 3*10 Seated clams red tband LAQ red tband  around bil ankles At bar: side stepping, squat pull offs     PATIENT EDUCATION:  Education details: Anatomy of condition, POC, HEP, exercise form/rationale  Person educated: Patient and Spouse Education method: Explanation, Demonstration, Tactile cues, and Verbal cues Education comprehension: verbalized understanding, returned demonstration, verbal cues required, tactile cues required, and needs further education     HOME EXERCISE PROGRAM: [id number]   ASSESSMENT:   CLINICAL IMPRESSION: Pt reported significant reduction of Rt hip pain (to 0/10) when exercising in water greater than 4 ft.  He had one episode of increased Rt hip pain to 9/10 with attempting to push yellow noodle down to pool floor with Rt foot; resolved with standing rest <103mn.  He reported fatigue at end of session, but pain remained low when exiting pool.  Pt making gradual gains towards remaining goals.    OBJECTIVE IMPAIRMENTS Abnormal gait, decreased activity tolerance, decreased balance, difficulty walking, decreased strength, impaired sensation, improper body mechanics, postural dysfunction, and pain.    ACTIVITY LIMITATIONS cleaning, community activity, driving, meal prep, laundry, and shopping.    PERSONAL FACTORS  see problem list  are also affecting patient's functional  outcome.      REHAB POTENTIAL: Good   CLINICAL DECISION MAKING: Unstable/unpredictable   EVALUATION COMPLEXITY: High     GOALS: Goals reviewed with patient? Yes   SHORT TERM GOALS:   Able to enter/exit pool independently with use of hand rails Baseline: will begin training Target date: 04/03/2022 Goal status: Achieved    2.  Good tolerance to exercise without limitations in next day due to pain Baseline: Target date: 04/03/2022 Goal status: achieved    3.  Able to shift weight to perform step taps with UE assist Baseline: unable at eval Target date: 04/03/2022 Goal status: Achieved    4.  Able to ambulate with centered weight distribution Baseline: [facility] 04/03[facility]2022 Goal status: achieved       LONG TERM GOALS:   BERG to improve by MDC of 6 points Baseline: 33 at eval Target date:  07/06/22 Goal status: achieved   2.  Pt will ambulate safely around his home without use of AD Baseline: achieved Goal status: ongoing   3.  Able to navigate stairs in home safely and confidently Baseline:navigating stairs with "up with the good, down with the bad" pattern  Goal status: ongoing   4.  LEFS to improve by MDC Baseline: MDC is 9 points  Goal status: INITIAL   5.  Pt will demo 5TSTS in 20s or less Baseline: 38s at eval  Goal status: Achieved - 04/19/22   6.  Pt will be able to rid home of aid devices Baseline: taken toilet seats out, uses stool in shower but for convenience, not need; still using walker to carry things  Goal status: partially met  7. Start walking around the neighborhood, ultimately without AD  Baseline: walking but with cane.     Goal status: ongoing   PLAN: PT FREQUENCY: 1-2x/week   PT DURATION: other: 4 months   PLANNED INTERVENTIONS: Therapeutic exercises, Therapeutic activity, Neuromuscular re-education, Balance training, Gait training, Patient/Family education, Joint mobilization, Stair training, Aquatic Therapy,  Electrical stimulation, Cryotherapy, Moist heat, Taping, and Manual therapy   PLAN FOR NEXT SESSION: gross large muscle strengthening in lower body  JKerin Perna PTA 09/25/22 12:11 PM [facility] [address] [address]Phone: [phone]  Fax:  [phone]

## 2022-09-26 ENCOUNTER — Telehealth: Payer: Self-pay

## 2022-09-26 ENCOUNTER — Telehealth (HOSPITAL_COMMUNITY): Payer: Self-pay

## 2022-09-26 NOTE — Telephone Encounter (Signed)
Spoke with the patient, detailed instructions given. He stated that he would be here for his test. Asked to call back with any questions. Bruce Ball EMTP 

## 2022-09-26 NOTE — Telephone Encounter (Signed)
     Patient  visit on 09/17/2022  at [facility] you been able to follow up with your primary care physician? YES  The patient was or was not able to obtain any needed medicine or equipment.YES  Are there diet recommendations that you are having difficulty following?NA  Patient expresses understanding of discharge instructions and education provided has no other needs at this time. [place], [facility], Care Management  [phone] [address], [place], [place] [address] Phone: [phone] Email: [email or web address]

## 2022-09-27 ENCOUNTER — Encounter (HOSPITAL_BASED_OUTPATIENT_CLINIC_OR_DEPARTMENT_OTHER): Payer: Self-pay | Admitting: Physical Therapy

## 2022-09-27 ENCOUNTER — Ambulatory Visit (HOSPITAL_BASED_OUTPATIENT_CLINIC_OR_DEPARTMENT_OTHER): Payer: Medicare Other | Admitting: Physical Therapy

## 2022-09-27 DIAGNOSIS — M6281 Muscle weakness (generalized): Secondary | ICD-10-CM | POA: Diagnosis not present

## 2022-09-27 DIAGNOSIS — I10 Essential (primary) hypertension: Secondary | ICD-10-CM | POA: Diagnosis not present

## 2022-09-27 DIAGNOSIS — M25551 Pain in right hip: Secondary | ICD-10-CM | POA: Diagnosis not present

## 2022-09-27 DIAGNOSIS — M79604 Pain in right leg: Secondary | ICD-10-CM | POA: Diagnosis not present

## 2022-09-27 DIAGNOSIS — R0609 Other forms of dyspnea: Secondary | ICD-10-CM | POA: Diagnosis not present

## 2022-09-27 DIAGNOSIS — R262 Difficulty in walking, not elsewhere classified: Secondary | ICD-10-CM

## 2022-09-27 NOTE — Therapy (Signed)
OUTPATIENT PHYSICAL THERAPY TREATMENT NOTE     Patient Name: Bruce Ball MRN: [id number] DOB:1946/10/29, 76 y.o., male Today's Date: 09/27/2022  PCP: [name], MD REFERRING PROVIDER: [name], MD   PT End of Session - 09/27/22 1110     Visit Number 9    Number of Visits 69    Date for PT Re-Evaluation 10/25/22    Authorization Type MCR    Progress Note Due on Visit 16    PT Start Time 1108    PT Stop Time 4496    PT Time Calculation (min) 37 min    Activity Tolerance Patient tolerated treatment well    Behavior During Therapy WFL for tasks assessed/performed                    Past Medical History:  Diagnosis Date   Ankylosing spondylitis ([place])    Arthritis of right knee    Basal cell carcinoma    Chronic back pain greater than 3 months duration    Chronic leg pain    CKD (chronic kidney disease), stage II    Coronary artery calcification seen on CAT scan 03/27/2021   Deafness in right ear    Dyspnea    cause by PE   Essential tremor    Extrinsic asthma, unspecified 04/07/2009   PT DENIES   GERD (gastroesophageal reflux disease)    Hearing loss    Hip problem    History of kidney stones    HYPERTENSION 10/29/2007   HYPOTENSION 08/13/2010   LOW BACK PAIN 04/21/2008   NEPHROLITHIASIS, HX OF 04/21/2008   Neuropathy    Osteopenia    Other hyperlipidemia    Peptic ulcer    Pneumonia    PUD, HX OF 04/21/2008   Pulmonary emboli ([place]) 2021   noted on CT 12-2019   Renal disorder    Stage 3 chronic kidney disease (HCC)    Thyroid nodule    Past Surgical History:  Procedure Laterality Date   BACK SURGERY     bone cancer  1970   rt femur removed and steel rod placed   CYSTOSCOPY WITH RETROGRADE PYELOGRAM, URETEROSCOPY AND STENT PLACEMENT Right 11/06/2017   Procedure: CYSTOSCOPY WITH  STENT PLACEMENT RIGHT;  Surgeon: Raynelle Bring, MD;  Location: [facility];  Service: Urology;  Laterality: Right;   CYSTOSCOPY WITH RETROGRADE  PYELOGRAM, URETEROSCOPY AND STENT PLACEMENT Left 06/09/2019   Procedure: CYSTOSCOPY WITH RETROGRADE PYELOGRAM, URETEROSCOPY AND STENT PLACEMENT X2;  Surgeon: [name], MD;  Location: [facility];  Service: Urology;  Laterality: Left;   CYSTOSCOPY WITH RETROGRADE PYELOGRAM, URETEROSCOPY AND STENT PLACEMENT Bilateral 03/01/2020   Procedure: CYSTOSCOPY WITH RETROGRADE PYELOGRAM, URETEROSCOPY AND STENT PLACEMENT;  Surgeon: [name], MD;  Location: [facility];  Service: Urology;  Laterality: Bilateral;  75 MINS   CYSTOSCOPY/RETROGRADE/URETEROSCOPY     1 year ago at [place] in [place] Right 11/24/2017   Procedure: CYSTOSCOPY/URETEROSCOPY/ RETROGRADE/STENT REMOVAL;  Surgeon: Raynelle Bring, MD;  Location: [facility];  Service: Urology;  Laterality: Right;   HOLMIUM LASER APPLICATION Bilateral 07/03/9162   Procedure: HOLMIUM LASER APPLICATION;  Surgeon: [name], MD;  Location: [facility];  Service: Urology;  Laterality: Bilateral;   prosatectomy     Patient Active Problem List   Diagnosis Date Noted   Basal cell carcinoma of scalp 09/16/2022   Deposits (accretions) on teeth 09/16/2022   Dry eye syndrome of bilateral lacrimal glands 09/16/2022   Person encountering health services  to consult on behalf of another person 09/16/2022   Encounter for issue of repeat prescription 09/16/2022   Encounter for fitting and adjustment of hearing aid 09/16/2022   Irreversible pulpitis 09/16/2022   Nontoxic single thyroid nodule 09/16/2022   Other chronic pain 09/16/2022   Pain in right hand 09/16/2022   Personal history of other venous thrombosis and embolism 09/16/2022   Hypertension 09/16/2022   Thyroid nodule 09/16/2022   Sensorineural hearing loss, bilateral 09/16/2022   Post-COVID syndrome 09/16/2022   Chronic pansinusitis 07/11/2022   Nasal obstruction 07/11/2022   Nasal polyposis 06/11/2022   Non-seasonal allergic rhinitis 06/11/2022   Abrasion of right arm  11/19/2021   Acute blood loss anemia 10/26/2021   PUD (peptic ulcer disease) 10/26/2021   Acute post-operative pain 10/23/2021   Femur fracture, right ([place]) 10/17/2021   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[place]) 10/16/2021   History of pulmonary embolism 10/10/2021   HTN (hypertension) 10/10/2021   Chronic diastolic CHF (congestive heart failure) ([place]) 10/10/2021   GERD (gastroesophageal reflux disease) 10/10/2021   CKD (chronic kidney disease), stage III ([place]) 10/10/2021   Hyperlipidemia 10/10/2021   CAD (coronary artery disease) 10/10/2021   Periprosthetic fracture of shaft of femur 10/10/2021   Other fatigue 06/05/2021   CAD (coronary artery disease) 90/24/0973   Diastolic CHF ([place]) 53/29/9242   SOB (shortness of breath) on exertion 03/26/2021   Chronic anticoagulation 03/26/2021   Anemia in chronic kidney disease 01/25/2021   Acquired coagulation disorder ([place]) 12/12/2020   Senile purpura ([place]) 12/12/2020   S/P lumbar fusion 08/03/2020   Aftercare following surgery 08/03/2020   Thin skin 07/25/2020   Age-related osteoporosis without current pathological fracture 07/11/2020   Encounter for immunization 07/11/2020   Other specified counseling 07/11/2020   Malignant neoplasm of bone and articular cartilage ([place]) 07/11/2020   Malignant neoplasm of skin 07/11/2020   Meibomian gland dysfunction of right eye, unspecified eyelid 07/11/2020   Nuclear senile cataract 07/11/2020   Vitamin B12 deficient megaloblastic anemia 07/11/2020   Right lower quadrant pain 07/11/2020   Pyelonephritis 07/11/2020   Aortic atherosclerosis ([place]) 05/17/2020   B12 deficiency 05/17/2020   Iliac artery aneurysm ([place]) 05/17/2020   Osteoporosis 04/04/2020   Idiopathic neuropathy 01/28/2020   Recurrent falls 01/28/2020   Chronic kidney disease (CKD), stage III (moderate) ([place]) 01/19/2020   Pulmonary emboli ([place]) 01/14/2020   GERD (gastroesophageal reflux disease) 12/22/2019   Essential tremor 12/22/2019   Femoral  condyle fracture ([place]) 07/15/2019   Acute pain of right knee 07/15/2019   Gross hematuria 06/14/2019   Hyperlipidemia 11/11/2017   Kidney stone 11/05/2017   Community acquired pneumonia of right upper lobe of lung 11/05/2017   Right ureteral stone 11/05/2017   History of total right hip replacement 03/08/2017   History of prostate cancer 03/08/2017   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   Vertigo 03/01/2017   Right flank discomfort 03/01/2017   Prerenal renal failure 03/01/2017   Personal history of prostate cancer 02/28/2017   High risk medication use 02/28/2017   DDD (degenerative disc disease), thoracic 02/28/2017   Syncope and collapse 02/28/2017   Ataxia 05/18/2016   Vestibular disequilibrium 05/18/2016   Failed spinal cord stimulator ([place]) 03/20/2016   Lung nodule, solitary 05/24/2014   Lumbosacral stenosis 06/05/2013   Malignant neoplasm of prostate ([place]) 06/05/2013   Peripheral edema 06/08/2012   Subcortical microvascular ischemic occlusive disease 08/13/2010   Backache 04/21/2008   History of peptic ulcer disease 04/21/2008   Essential hypertension 10/29/2007  REFERRING PROVIDER: [name], MD  REFERRING DIAG:  M54.50,G89.29 (ICD-10-CM) - Chronic bilateral low back pain without sciatica  R26.89 (ICD-10-CM) - Other abnormalities of gait and mobility      THERAPY DIAG:  Pain in right hip   Pain in right leg   Difficulty in walking, not elsewhere classified   Muscle weakness (generalized)   ONSET DATE: October 10, 2021 recent fall causing fracture  SUBJECTIVE:                                                                                                                                  SUBJECTIVE STATEMENT: Finished prednisone and feeling an increase in hip pain.    PERTINENT HISTORY:  Stenosis, Rt THA 3 yr ago with h/o periprosthetic fx & revision in 2022, h/o back surgery  Hard of hearing; can hear out of Left ear   PAIN:  Are you having pain: Yes  5/10 Pain location:  Rt groin and lateral hip  Description: sharp/dul Aggravating factors: Rt WB, walking Relieving factors: rest   PRECAUTIONS: Posterior hip   WEIGHT BEARING RESTRICTIONS No   FALLS:  Has patient fallen in last 6 months? Yes, Number of falls: 1 4/21: no falls recently 6/9: denies new falls 7/7: denies falls 8/7: denies falls 9/15: 3 falls     PATIENT GOALS build up strength, get rid of assistance for toileting and getting in/out of bed, no AD in gait, stairs, Son's wedding in [place] first of June     OBJECTIVE: *All objective findings taken at [facility] unless otherwise noted.    PATIENT SURVEYS:  LEFS 39 4/21: 46 5/19: 43 6/9: 43 7/7: 43  0 on walking 2 blocks, 1 mile & stiars- would like to see these improve in particular 8/7: 42  Still 0/4 on prev stated items 9/15 unchanged from 8/7     COGNITION:          Overall cognitive status: Within functional limits for tasks assessed                        SENSATION:          Bil hand/feet neuropathy unchanged since evaluation      LE MMT:  6/9 measured with handheld dynamometry Normative values: hip flexion 38 lb, knee extension 79 lb   MMT Right 03/06/2022 Right/Left 04/19/22 Rt/Lt 05/17/22 Rt/Lt 06/07/22 Rt//Lt 07/05/22 Rt//Lt 07/26/22 Rt//Lt 9/15  Hip flexion 4/5 11.1/ 24.3 20.9/42.6 34.8/44.2 38.9//42.1  20.1//37.3  Knee flexion 3/5 32.3/37.9 37.7/55.3 38.7/48.6 42.2//54.9    Knee extension 3/5 12.1/42.4 20.7/60.7 25.8/50.5 24.9//50.6 28.2//66.2 22.9//43.5   (Blank rows = not tested)  6/23:unable to lift RtLE against gravity in sidelying  8/7: able to lift knee in clam independently but requires assistance for sidelying straight leg hip abduction       FUNCTIONAL TESTS:  5 times sit to stand: 38s 4/21: 18s without use of UEs 6/9:16s without use of UEs  8/7: 14s with UEs, too much groin pain without UEs 9/15: 20s with UE for last 4- balance felt off more than groin pain Berg Balance Scale:  33/56 4/21: 40/50 6/9: 44/50    GAIT:   Comments: SPC in Lt hand, rt leg abducted and externally rotated, antalgic on Rt with pressure placed through cane 4/21: SPC in Lt hand, Rt leg no longer abd /ER; does demo Rt trunk SB in Rt stance phase 6/9: SPC in Lt hand, mild Rt trunk SB in Rt stance phase, maintains upright posture 7/7: with SPC: maint upright posture wihtout Rt sidebend in stance phase; without SPC- cont upright posture and good swing through but does present sidebend to the Rt in Rt stance phase 8/7: with SPC, upright mosture with minimal deviation from midline and minimal use of cane for assistance, antalgic without cane and flexes trunk rather than rotates.  9/15:notbale  maintenance of midline through trunk with ER and abd of LLE         TODAY'S TREATMENT  9/29: Nu step 5 min L3 Seated clam- ball bw ankles, green tband at knees; also performed reverse (reverse also done without band on ankles) Sit <>stand ball bw knees, cues to avoid pelvic shift to the left Isometric ball press into thigh- small ball, ipisilateral press Seated hamstring stretch Sidelying clams Sidelying hip abd- acitve assisted 3*10 Prone quad set- began adding hip extension   9/27: Pt seen for aquatic therapy today.  Treatment took place in water 3.25-4.75 ft in depth at the [facility]. Temp of water was 93.  Pt entered/exited the pool via stairs independently with step to pattern and bilat rail.               In water >4 ft deep unsupported:               * Warm up of forward, backward gait and side stepping  * side stepping with arm add with rainbow hand;  high knee marching forward/ backward with rainbow hand buoys under water             * monster walk forward             * holding wall:  hip abdct/add x 15; hip ext x 12; heel raises; curtsy lunges              * holding yellow hand buoys:  forward walking split squats             * holding yellow noodle:  alternating SLS  with 3 way toe tap x 5 reps each;             * at 4 ft, back against wall:  yellow noodle stomp with LLE, thin square noodle with RLE             * return to walking forward/ backward without support with increased speed in deepest water             * Quad stretch and Hamstring stretch at stairs holding rail R/L; bilat calf stretch    Pt requires the buoyancy and hydrostatic pressure of water for support, and to offload joints by unweighting joint load by at least 50 % in navel deep water and by at least 75-80% in chest to neck deep water.  Viscosity of the water is needed for resistance of strengthening. Water current perturbations provides challenge to standing balance requiring increased core activation.    9/22: Nu  step 5 min L6 Sidelying clams Sidelying hip abd- acitve assisted 3*10 Partial stand and pause 3*10 Seated clams red tband LAQ red tband around bil ankles At bar: side stepping, squat pull offs     PATIENT EDUCATION:  Education details: Anatomy of condition, POC, HEP, exercise form/rationale  Person educated: Patient and Spouse Education method: Explanation, Demonstration, Tactile cues, and Verbal cues Education comprehension: verbalized understanding, returned demonstration, verbal cues required, tactile cues required, and needs further education     HOME EXERCISE PROGRAM: [id number]   ASSESSMENT:   CLINICAL IMPRESSION: Able to demo hip ext to neutral in prone with toes propped. 2 incidence of pain that stopped activitiy. Reports fatigue rather than pain in sidelying exercises.    OBJECTIVE IMPAIRMENTS Abnormal gait, decreased activity tolerance, decreased balance, difficulty walking, decreased strength, impaired sensation, improper body mechanics, postural dysfunction, and pain.    ACTIVITY LIMITATIONS cleaning, community activity, driving, meal prep, laundry, and shopping.    PERSONAL FACTORS  see problem list  are also affecting patient's functional outcome.       REHAB POTENTIAL: Good   CLINICAL DECISION MAKING: Unstable/unpredictable   EVALUATION COMPLEXITY: High     GOALS: Goals reviewed with patient? Yes   SHORT TERM GOALS:   Able to enter/exit pool independently with use of hand rails Baseline: will begin training Target date: 04/03/2022 Goal status: Achieved    2.  Good tolerance to exercise without limitations in next day due to pain Baseline: Target date: 04/03/2022 Goal status: achieved    3.  Able to shift weight to perform step taps with UE assist Baseline: unable at eval Target date: 04/03/2022 Goal status: Achieved    4.  Able to ambulate with centered weight distribution Baseline: able with [facility] Target date: 04/03/2022 Goal status: achieved       LONG TERM GOALS:   BERG to improve by MDC of 6 points Baseline: 33 at eval Target date:  07/06/22 Goal status: achieved   2.  Pt will ambulate safely around his home without use of AD Baseline: achieved Goal status: ongoing   3.  Able to navigate stairs in home safely and confidently Baseline:navigating stairs with "up with the good, down with the bad" pattern  Goal status: ongoing   4.  LEFS to improve by MDC Baseline: MDC is 9 points  Goal status: INITIAL   5.  Pt will demo 5TSTS in 20s or less Baseline: 38s at eval  Goal status: Achieved - 04/19/22   6.  Pt will be able to rid home of aid devices Baseline: taken toilet seats out, uses stool in shower but for convenience, not need; still using walker to carry things  Goal status: partially met  7. Start walking around the neighborhood, ultimately without AD  Baseline: walking but with cane.     Goal status: ongoing   PLAN: PT FREQUENCY: 1-2x/week   PT DURATION: other: 4 months   PLANNED INTERVENTIONS: Therapeutic exercises, Therapeutic activity, Neuromuscular re-education, Balance training, Gait training, Patient/Family education, Joint mobilization, Stair training, Aquatic Therapy, Electrical  stimulation, Cryotherapy, Moist heat, Taping, and Manual therapy   PLAN FOR NEXT SESSION: gross large muscle strengthening in lower body  Jazzie Trampe C. Milyn Stapleton PT, DPT 09/27/22 2:28 PM

## 2022-10-01 ENCOUNTER — Ambulatory Visit (HOSPITAL_COMMUNITY): Payer: Medicare Other | Attending: Physician Assistant

## 2022-10-01 DIAGNOSIS — R0609 Other forms of dyspnea: Secondary | ICD-10-CM | POA: Insufficient documentation

## 2022-10-01 LAB — MYOCARDIAL PERFUSION IMAGING
Estimated workload: 1
Exercise duration (min): 1 min
Exercise duration (sec): 0 s
LV dias vol: 84 mL (ref 62–150)
LV sys vol: 34 mL
MPHR: 144 {beats}/min
Nuc Stress EF: 60 %
Peak HR: 69 {beats}/min
Percent HR: 47 %
Rest HR: 56 {beats}/min
Rest Nuclear Isotope Dose: 10.2 mCi
SDS: 0
SRS: 0
SSS: 0
ST Depression (mm): 0 mm
Stress Nuclear Isotope Dose: 30.9 mCi
TID: 1.17

## 2022-10-01 MED ORDER — TECHNETIUM TC 99M TETROFOSMIN IV KIT
10.2000 | PACK | Freq: Once | INTRAVENOUS | Status: AC | PRN
  Administered 2022-10-01: 10.2 via INTRAVENOUS

## 2022-10-01 MED ORDER — REGADENOSON 0.4 MG/5ML IV SOLN
0.4000 mg | Freq: Once | INTRAVENOUS | Status: AC
  Administered 2022-10-01: 0.4 mg via INTRAVENOUS

## 2022-10-01 MED ORDER — TECHNETIUM TC 99M TETROFOSMIN IV KIT
30.9000 | PACK | Freq: Once | INTRAVENOUS | Status: AC | PRN
  Administered 2022-10-01: 30.9 via INTRAVENOUS

## 2022-10-02 ENCOUNTER — Encounter (HOSPITAL_BASED_OUTPATIENT_CLINIC_OR_DEPARTMENT_OTHER): Payer: Self-pay | Admitting: Physical Therapy

## 2022-10-02 ENCOUNTER — Ambulatory Visit (HOSPITAL_BASED_OUTPATIENT_CLINIC_OR_DEPARTMENT_OTHER): Payer: Medicare Other | Attending: Family Medicine | Admitting: Physical Therapy

## 2022-10-02 DIAGNOSIS — M79604 Pain in right leg: Secondary | ICD-10-CM | POA: Insufficient documentation

## 2022-10-02 DIAGNOSIS — R262 Difficulty in walking, not elsewhere classified: Secondary | ICD-10-CM | POA: Insufficient documentation

## 2022-10-02 DIAGNOSIS — M25551 Pain in right hip: Secondary | ICD-10-CM | POA: Insufficient documentation

## 2022-10-02 DIAGNOSIS — M6281 Muscle weakness (generalized): Secondary | ICD-10-CM | POA: Diagnosis not present

## 2022-10-02 NOTE — Therapy (Signed)
OUTPATIENT PHYSICAL THERAPY TREATMENT NOTE     Patient Name: Bruce Ball MRN: [id number] DOB:[birth year], 76 y.o., male Today's Date: 10/02/2022  PCP: [name], MD REFERRING PROVIDER: [name], MD   PT End of Session - 10/02/22 1122     Visit Number 45    Number of Visits 26    Date for PT Re-Evaluation 10/25/22    Authorization Type MCR    Progress Note Due on Visit 67    PT Start Time 1117    PT Stop Time 5859    PT Time Calculation (min) 41 min    Behavior During Therapy [facility] for tasks assessed/performed                    Past Medical History:  Diagnosis Date   Ankylosing spondylitis ([place])    Arthritis of right knee    Basal cell carcinoma    Chronic back pain greater than 3 months duration    Chronic leg pain    CKD (chronic kidney disease), stage II    Coronary artery calcification seen on CAT scan 03/27/2021   Deafness in right ear    Dyspnea    cause by PE   Essential tremor    Extrinsic asthma, unspecified 04/07/2009   PT DENIES   GERD (gastroesophageal reflux disease)    Hearing loss    Hip problem    History of kidney stones    HYPERTENSION 10/29/2007   HYPOTENSION 08/13/2010   LOW BACK PAIN 04/21/2008   NEPHROLITHIASIS, HX OF 04/21/2008   Neuropathy    Osteopenia    Other hyperlipidemia    Peptic ulcer    Pneumonia    PUD, HX OF 04/21/2008   Pulmonary emboli ([place]) 2021   noted on CT 12-2019   Renal disorder    Stage 3 chronic kidney disease (HCC)    Thyroid nodule    Past Surgical History:  Procedure Laterality Date   BACK SURGERY     bone cancer  1970   rt femur removed and steel rod placed   CYSTOSCOPY WITH RETROGRADE PYELOGRAM, URETEROSCOPY AND STENT PLACEMENT Right 11/06/2017   Procedure: CYSTOSCOPY WITH  STENT PLACEMENT RIGHT;  Surgeon: Raynelle Bring, MD;  Location: [facility];  Service: Urology;  Laterality: Right;   CYSTOSCOPY WITH RETROGRADE PYELOGRAM, URETEROSCOPY AND STENT PLACEMENT Left 06/09/2019    Procedure: CYSTOSCOPY WITH RETROGRADE PYELOGRAM, URETEROSCOPY AND STENT PLACEMENT X2;  Surgeon: [name], MD;  Location: [facility];  Service: Urology;  Laterality: Left;   CYSTOSCOPY WITH RETROGRADE PYELOGRAM, URETEROSCOPY AND STENT PLACEMENT Bilateral 03/01/2020   Procedure: CYSTOSCOPY WITH RETROGRADE PYELOGRAM, URETEROSCOPY AND STENT PLACEMENT;  Surgeon: [name], MD;  Location: [facility];  Service: Urology;  Laterality: Bilateral;  75 MINS   CYSTOSCOPY/RETROGRADE/URETEROSCOPY     1 year ago at [place] in [place] Right 11/24/2017   Procedure: CYSTOSCOPY/URETEROSCOPY/ RETROGRADE/STENT REMOVAL;  Surgeon: Raynelle Bring, MD;  Location: [facility];  Service: Urology;  Laterality: Right;   HOLMIUM LASER APPLICATION Bilateral 02/07/2445   Procedure: HOLMIUM LASER APPLICATION;  Surgeon: [name], MD;  Location: [facility];  Service: Urology;  Laterality: Bilateral;   prosatectomy     Patient Active Problem List   Diagnosis Date Noted   Basal cell carcinoma of scalp 09/16/2022   Deposits (accretions) on teeth 09/16/2022   Dry eye syndrome of bilateral lacrimal glands 09/16/2022   Person encountering health services to consult on behalf of another person 09/16/2022  Encounter for issue of repeat prescription 09/16/2022   Encounter for fitting and adjustment of hearing aid 09/16/2022   Irreversible pulpitis 09/16/2022   Nontoxic single thyroid nodule 09/16/2022   Other chronic pain 09/16/2022   Pain in right hand 09/16/2022   Personal history of other venous thrombosis and embolism 09/16/2022   Hypertension 09/16/2022   Thyroid nodule 09/16/2022   Sensorineural hearing loss, bilateral 09/16/2022   Post-COVID syndrome 09/16/2022   Chronic pansinusitis 07/11/2022   Nasal obstruction 07/11/2022   Nasal polyposis 06/11/2022   Non-seasonal allergic rhinitis 06/11/2022   Abrasion of right arm 11/19/2021   Acute blood loss anemia 10/26/2021   PUD (peptic  ulcer disease) 10/26/2021   Acute post-operative pain 10/23/2021   Femur fracture, right ([place]) 10/17/2021   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[place]) 10/16/2021   History of pulmonary embolism 10/10/2021   HTN (hypertension) 10/10/2021   Chronic diastolic CHF (congestive heart failure) ([place]) 10/10/2021   GERD (gastroesophageal reflux disease) 10/10/2021   CKD (chronic kidney disease), stage III ([place]) 10/10/2021   Hyperlipidemia 10/10/2021   CAD (coronary artery disease) 10/10/2021   Periprosthetic fracture of shaft of femur 10/10/2021   Other fatigue 06/05/2021   CAD (coronary artery disease) 54/27/0623   Diastolic CHF ([place]) 76/28/3151   SOB (shortness of breath) on exertion 03/26/2021   Chronic anticoagulation 03/26/2021   Anemia in chronic kidney disease 01/25/2021   Acquired coagulation disorder ([place]) 12/12/2020   Senile purpura ([place]) 12/12/2020   S/P lumbar fusion 08/03/2020   Aftercare following surgery 08/03/2020   Thin skin 07/25/2020   Age-related osteoporosis without current pathological fracture 07/11/2020   Encounter for immunization 07/11/2020   Other specified counseling 07/11/2020   Malignant neoplasm of bone and articular cartilage ([place]) 07/11/2020   Malignant neoplasm of skin 07/11/2020   Meibomian gland dysfunction of right eye, unspecified eyelid 07/11/2020   Nuclear senile cataract 07/11/2020   Vitamin B12 deficient megaloblastic anemia 07/11/2020   Right lower quadrant pain 07/11/2020   Pyelonephritis 07/11/2020   Aortic atherosclerosis ([place]) 05/17/2020   B12 deficiency 05/17/2020   Iliac artery aneurysm ([place]) 05/17/2020   Osteoporosis 04/04/2020   Idiopathic neuropathy 01/28/2020   Recurrent falls 01/28/2020   Chronic kidney disease (CKD), stage III (moderate) ([place]) 01/19/2020   Pulmonary emboli ([place]) 01/14/2020   GERD (gastroesophageal reflux disease) 12/22/2019   Essential tremor 12/22/2019   Femoral condyle fracture ([place]) 07/15/2019   Acute pain of right knee  07/15/2019   Gross hematuria 06/14/2019   Hyperlipidemia 11/11/2017   Kidney stone 11/05/2017   Community acquired pneumonia of right upper lobe of lung 11/05/2017   Right ureteral stone 11/05/2017   History of total right hip replacement 03/08/2017   History of prostate cancer 03/08/2017   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   Vertigo 03/01/2017   Right flank discomfort 03/01/2017   Prerenal renal failure 03/01/2017   Personal history of prostate cancer 02/28/2017   High risk medication use 02/28/2017   DDD (degenerative disc disease), thoracic 02/28/2017   Syncope and collapse 02/28/2017   Ataxia 05/18/2016   Vestibular disequilibrium 05/18/2016   Failed spinal cord stimulator ([place]) 03/20/2016   Lung nodule, solitary 05/24/2014   Lumbosacral stenosis 06/05/2013   Malignant neoplasm of prostate ([place]) 06/05/2013   Peripheral edema 06/08/2012   Subcortical microvascular ischemic occlusive disease 08/13/2010   Backache 04/21/2008   History of peptic ulcer disease 04/21/2008   Essential hypertension 10/29/2007  REFERRING PROVIDER: [name], MD   REFERRING DIAG:  M54.50,G89.29 (ICD-10-CM) - Chronic bilateral low back  pain without sciatica  R26.89 (ICD-10-CM) - Other abnormalities of gait and mobility      THERAPY DIAG:  Pain in right hip   Pain in right leg   Difficulty in walking, not elsewhere classified   Muscle weakness (generalized)   ONSET DATE: October 10, 2021 recent fall causing fracture  SUBJECTIVE:                                                                                                                                  SUBJECTIVE STATEMENT: Pt reports he did a stress test yesterday; has had increased pain in sinuses since then and has had blood in urine.  He reports he is back to [facility] water classes and his energy is improved.  He has not been walking in neighborhood because wife's schedule has been busy, but he has been walking around house without  cane.    PERTINENT HISTORY:  Stenosis, Rt THA 60 yr ago with h/o periprosthetic fx & revision in 2022, h/o back surgery  Hard of hearing; can hear out of Left ear   PAIN:  Are you having pain: Yes 1/10 Pain location:  Rt groin and lateral hip  Description: sharp/dul Aggravating factors: Rt WB, walking Relieving factors: rest   PRECAUTIONS: Posterior hip   WEIGHT BEARING RESTRICTIONS No   FALLS:  Has patient fallen in last 6 months? Yes, Number of falls: 1 4/21: no falls recently 6/9: denies new falls 7/7: denies falls 8/7: denies falls 9/15: 3 falls     PATIENT GOALS build up strength, get rid of assistance for toileting and getting in/out of bed, no AD in gait, stairs, Son's wedding in [place] first of June     OBJECTIVE: *All objective findings taken at [facility] unless otherwise noted.    PATIENT SURVEYS:  LEFS 39 4/21: 46 5/19: 43 6/9: 43 7/7: 43  0 on walking 2 blocks, 1 mile & stiars- would like to see these improve in particular 8/7: 42  Still 0/4 on prev stated items 9/15 unchanged from 8/7     COGNITION:          Overall cognitive status: Within functional limits for tasks assessed                        SENSATION:          Bil hand/feet neuropathy unchanged since evaluation      LE MMT:  6/9 measured with handheld dynamometry Normative values: hip flexion 38 lb, knee extension 79 lb   MMT Right 03/06/2022 Right/Left 04/19/22 Rt/Lt 05/17/22 Rt/Lt 06/07/22 Rt//Lt 07/05/22 Rt//Lt 07/26/22 Rt//Lt 9/15  Hip flexion 4/5 11.1/ 24.3 20.9/42.6 34.8/44.2 38.9//42.1  20.1//37.3  Knee flexion 3/5 32.3/37.9 37.7/55.3 38.7/48.6 42.2//54.9    Knee extension 3/5 12.1/42.4 20.7/60.7 25.8/50.5 24.9//50.6 28.2//66.2 22.9//43.5   (Blank rows = not tested)  6/23:unable to lift RtLE against gravity in sidelying  8/7: able  to lift knee in clam independently but requires assistance for sidelying straight leg hip abduction       FUNCTIONAL TESTS:  5 times sit to stand:  38s 4/21: 18s without use of UEs 6/9:16s without use of UEs 8/7: 14s with UEs, too much groin pain without UEs 9/15: 20s with UE for last 4- balance felt off more than groin pain Berg Balance Scale: 33/56 4/21: 40/50 6/9: 44/50    GAIT:   Comments: SPC in Lt hand, rt leg abducted and externally rotated, antalgic on Rt with pressure placed through cane 4/21: SPC in Lt hand, Rt leg no longer abd /ER; does demo Rt trunk SB in Rt stance phase 6/9: SPC in Lt hand, mild Rt trunk SB in Rt stance phase, maintains upright posture 7/7: with SPC: maint upright posture wihtout Rt sidebend in stance phase; without SPC- cont upright posture and good swing through but does present sidebend to the Rt in Rt stance phase 8/7: with SPC, upright mosture with minimal deviation from midline and minimal use of cane for assistance, antalgic without cane and flexes trunk rather than rotates.  9/15:notbale  maintenance of midline through trunk with ER and abd of LLE         TODAY'S TREATMENT  10/4: Pt seen for aquatic therapy today.  Treatment took place in water 3.25-4.75 ft in depth at the [facility]. Temp of water was 93.  Pt entered/exited the pool via stairs independently with step to pattern and bilat rail.               In water >4 ft deep unsupported:               * Warm up of forward, backward gait and side stepping  * side stepping with arm add with rainbow hand;  high knee marching forward/ backward with rainbow hand buoys under water  * holding yellow noodle:  alternating SLS with 3 way LE kick x 5 reps each; wide heel raises; curtsy lunges             * holding yellow hand buoys:  forward walking split squats  * Rt lateral step ups with heavy UE on rail;  Rt forward step ups   * SLS R/L x 5 sec each -  * Return to walking in deeper water with increased speed, light jogging   * retro step ups RLE, forward step downs LLE in 4 ft with RUE on wall  * at bench, staggered stance STS  x 10 with RLE back   * quad and hamstring stretches at stairs.         Pt requires the buoyancy and hydrostatic pressure of water for support, and to offload joints by unweighting joint load by at least 50 % in navel deep water and by at least 75-80% in chest to neck deep water.  Viscosity of the water is needed for resistance of strengthening. Water current perturbations provides challenge to standing balance requiring increased core activation.  9/29: Nu step 5 min L3 Seated clam- ball bw ankles, green tband at knees; also performed reverse (reverse also done without band on ankles) Sit <>stand ball bw knees, cues to avoid pelvic shift to the left Isometric ball press into thigh- small ball, ipisilateral press Seated hamstring stretch Sidelying clams Sidelying hip abd- acitve assisted 3*10 Prone quad set- began adding hip extension         PATIENT EDUCATION:  Education details: Geophysicist/field seismologist of  condition, POC, HEP, exercise form/rationale  Person educated: Patient and Spouse Education method: Explanation, Demonstration, Tactile cues, and Verbal cues Education comprehension: verbalized understanding, returned demonstration, verbal cues required, tactile cues required, and needs further education     HOME EXERCISE PROGRAM: [id number]   ASSESSMENT:   CLINICAL IMPRESSION: Making good progress towards LTG#6. Pt had 2 episodes of LOB at stairs; one in SLS and one with Lt lateral step downs. Pt was able to right himself in water with UE on wall.  Pt reporting greater ease with steps and exercises overall.  Pain to 0/10 while in water. Pt making good gains towards remaining goals.  Began d/c planning.  Encouraged pt to speak to Dr regarding increased sinus pain and blood in urine since yesterday's stress test.    OBJECTIVE IMPAIRMENTS Abnormal gait, decreased activity tolerance, decreased balance, difficulty walking, decreased strength, impaired sensation, improper body mechanics, postural  dysfunction, and pain.    ACTIVITY LIMITATIONS cleaning, community activity, driving, meal prep, laundry, and shopping.    PERSONAL FACTORS  see problem list  are also affecting patient's functional outcome.      REHAB POTENTIAL: Good   CLINICAL DECISION MAKING: Unstable/unpredictable   EVALUATION COMPLEXITY: High     GOALS: Goals reviewed with patient? Yes   SHORT TERM GOALS:   Able to enter/exit pool independently with use of hand rails Baseline: will begin training Target date: 04/03/2022 Goal status: Achieved    2.  Good tolerance to exercise without limitations in next day due to pain Baseline: Target date: 04/03/2022 Goal status: achieved    3.  Able to shift weight to perform step taps with UE assist Baseline: unable at eval Target date: 04/03/2022 Goal status: Achieved    4.  Able to ambulate with centered weight distribution Baseline: able with [facility] Target date: 04/03/2022 Goal status: achieved       LONG TERM GOALS:   BERG to improve by MDC of 6 points Baseline: 33 at eval Target date:  07/06/22 Goal status: achieved   2.  Pt will ambulate safely around his home without use of AD Baseline: achieved Goal status: ongoing   3.  Able to navigate stairs in home safely and confidently Baseline:navigating stairs with "up with the good, down with the bad" pattern  Goal status: ongoing   4.  LEFS to improve by MDC Baseline: MDC is 9 points  Goal status: INITIAL   5.  Pt will demo 5TSTS in 20s or less Baseline: 38s at eval  Goal status: Achieved - 04/19/22   6.  Pt will be able to rid home of aid devices Baseline: taken toilet seats out, uses stool in shower but for convenience, not need; still using walker to carry things  Goal status: partially met  7. Start walking around the neighborhood, ultimately without AD  Baseline: walking but with cane.     Goal status: ongoing   PLAN: PT FREQUENCY: 1-2x/week   PT DURATION: other: 4 months   PLANNED  INTERVENTIONS: Therapeutic exercises, Therapeutic activity, Neuromuscular re-education, Balance training, Gait training, Patient/Family education, Joint mobilization, Stair training, Aquatic Therapy, Electrical stimulation, Cryotherapy, Moist heat, Taping, and Manual therapy   PLAN FOR NEXT SESSION: gross large muscle strengthening in lower body  Kerin Perna, PTA 10/02/22 2:06 PM [facility] [address] [place], [place], [address] Phone: [phone]   Fax:  [phone]

## 2022-10-04 ENCOUNTER — Encounter (HOSPITAL_BASED_OUTPATIENT_CLINIC_OR_DEPARTMENT_OTHER): Payer: Medicare Other | Admitting: Physical Therapy

## 2022-10-05 DIAGNOSIS — I2699 Other pulmonary embolism without acute cor pulmonale: Secondary | ICD-10-CM | POA: Diagnosis not present

## 2022-10-06 DIAGNOSIS — I2699 Other pulmonary embolism without acute cor pulmonale: Secondary | ICD-10-CM | POA: Diagnosis not present

## 2022-10-07 DIAGNOSIS — Z23 Encounter for immunization: Secondary | ICD-10-CM | POA: Diagnosis not present

## 2022-10-09 ENCOUNTER — Encounter (HOSPITAL_BASED_OUTPATIENT_CLINIC_OR_DEPARTMENT_OTHER): Payer: Self-pay | Admitting: Physical Therapy

## 2022-10-09 ENCOUNTER — Ambulatory Visit (HOSPITAL_BASED_OUTPATIENT_CLINIC_OR_DEPARTMENT_OTHER): Payer: Medicare Other | Admitting: Physical Therapy

## 2022-10-09 DIAGNOSIS — M79604 Pain in right leg: Secondary | ICD-10-CM

## 2022-10-09 DIAGNOSIS — R262 Difficulty in walking, not elsewhere classified: Secondary | ICD-10-CM

## 2022-10-09 DIAGNOSIS — M6281 Muscle weakness (generalized): Secondary | ICD-10-CM | POA: Diagnosis not present

## 2022-10-09 DIAGNOSIS — M25551 Pain in right hip: Secondary | ICD-10-CM | POA: Diagnosis not present

## 2022-10-09 NOTE — Therapy (Signed)
OUTPATIENT PHYSICAL THERAPY TREATMENT NOTE     Patient Name: Bruce Ball MRN: [id number] DOB:10-29-1946, 76 y.o., male Today's Date: 10/09/2022  PCP: [name], MD REFERRING PROVIDER: [name], MD   PT End of Session - 10/09/22 1120     Visit Number 68    Number of Visits 25    Date for PT Re-Evaluation 10/25/22    Authorization Type MCR    Progress Note Due on Visit 65    PT Start Time 1118    PT Stop Time 1157    PT Time Calculation (min) 39 min    Behavior During Therapy WFL for tasks assessed/performed                    Past Medical History:  Diagnosis Date   Ankylosing spondylitis ([place])    Arthritis of right knee    Basal cell carcinoma    Chronic back pain greater than 3 months duration    Chronic leg pain    CKD (chronic kidney disease), stage II    Coronary artery calcification seen on CAT scan 03/27/2021   Deafness in right ear    Dyspnea    cause by PE   Essential tremor    Extrinsic asthma, unspecified 04/07/2009   PT DENIES   GERD (gastroesophageal reflux disease)    Hearing loss    Hip problem    History of kidney stones    HYPERTENSION 10/29/2007   HYPOTENSION 08/13/2010   LOW BACK PAIN 04/21/2008   NEPHROLITHIASIS, HX OF 04/21/2008   Neuropathy    Osteopenia    Other hyperlipidemia    Peptic ulcer    Pneumonia    PUD, HX OF 04/21/2008   Pulmonary emboli ([place]) 2021   noted on CT 12-2019   Renal disorder    Stage 3 chronic kidney disease (HCC)    Thyroid nodule    Past Surgical History:  Procedure Laterality Date   BACK SURGERY     bone cancer  1970   rt femur removed and steel rod placed   CYSTOSCOPY WITH RETROGRADE PYELOGRAM, URETEROSCOPY AND STENT PLACEMENT Right 11/06/2017   Procedure: CYSTOSCOPY WITH  STENT PLACEMENT RIGHT;  Surgeon: Raynelle Bring, MD;  Location: [facility];  Service: Urology;  Laterality: Right;   CYSTOSCOPY WITH RETROGRADE PYELOGRAM, URETEROSCOPY AND STENT PLACEMENT Left 06/09/2019    Procedure: CYSTOSCOPY WITH RETROGRADE PYELOGRAM, URETEROSCOPY AND STENT PLACEMENT X2;  Surgeon: [name], MD;  Location: [facility];  Service: Urology;  Laterality: Left;   CYSTOSCOPY WITH RETROGRADE PYELOGRAM, URETEROSCOPY AND STENT PLACEMENT Bilateral 03/01/2020   Procedure: CYSTOSCOPY WITH RETROGRADE PYELOGRAM, URETEROSCOPY AND STENT PLACEMENT;  Surgeon: [name], MD;  Location: [facility];  Service: Urology;  Laterality: Bilateral;  75 MINS   CYSTOSCOPY/RETROGRADE/URETEROSCOPY     1 year ago at [facility] in [place] Right 11/24/2017   Procedure: CYSTOSCOPY/URETEROSCOPY/ RETROGRADE/STENT REMOVAL;  Surgeon: Raynelle Bring, MD;  Location: [facility];  Service: Urology;  Laterality: Right;   HOLMIUM LASER APPLICATION Bilateral 3/0/1601   Procedure: HOLMIUM LASER APPLICATION;  Surgeon: [name], MD;  Location: [facility];  Service: Urology;  Laterality: Bilateral;   prosatectomy     Patient Active Problem List   Diagnosis Date Noted   Basal cell carcinoma of scalp 09/16/2022   Deposits (accretions) on teeth 09/16/2022   Dry eye syndrome of bilateral lacrimal glands 09/16/2022   Person encountering health services to consult on behalf of another person 09/16/2022  Encounter for issue of repeat prescription 09/16/2022   Encounter for fitting and adjustment of hearing aid 09/16/2022   Irreversible pulpitis 09/16/2022   Nontoxic single thyroid nodule 09/16/2022   Other chronic pain 09/16/2022   Pain in right hand 09/16/2022   Personal history of other venous thrombosis and embolism 09/16/2022   Hypertension 09/16/2022   Thyroid nodule 09/16/2022   Sensorineural hearing loss, bilateral 09/16/2022   Post-COVID syndrome 09/16/2022   Chronic pansinusitis 07/11/2022   Nasal obstruction 07/11/2022   Nasal polyposis 06/11/2022   Non-seasonal allergic rhinitis 06/11/2022   Abrasion of right arm 11/19/2021   Acute blood loss anemia 10/26/2021   PUD (peptic  ulcer disease) 10/26/2021   Acute post-operative pain 10/23/2021   Femur fracture, right ([place]) 10/17/2021   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[place]) 10/16/2021   History of pulmonary embolism 10/10/2021   HTN (hypertension) 10/10/2021   Chronic diastolic CHF (congestive heart failure) ([place]) 10/10/2021   GERD (gastroesophageal reflux disease) 10/10/2021   CKD (chronic kidney disease), stage III ([place]) 10/10/2021   Hyperlipidemia 10/10/2021   CAD (coronary artery disease) 10/10/2021   Periprosthetic fracture of shaft of femur 10/10/2021   Other fatigue 06/05/2021   CAD (coronary artery disease) 54/27/0623   Diastolic CHF ([place]) 76/28/3151   SOB (shortness of breath) on exertion 03/26/2021   Chronic anticoagulation 03/26/2021   Anemia in chronic kidney disease 01/25/2021   Acquired coagulation disorder ([place]) 12/12/2020   Senile purpura ([place]) 12/12/2020   S/P lumbar fusion 08/03/2020   Aftercare following surgery 08/03/2020   Thin skin 07/25/2020   Age-related osteoporosis without current pathological fracture 07/11/2020   Encounter for immunization 07/11/2020   Other specified counseling 07/11/2020   Malignant neoplasm of bone and articular cartilage ([place]) 07/11/2020   Malignant neoplasm of skin 07/11/2020   Meibomian gland dysfunction of right eye, unspecified eyelid 07/11/2020   Nuclear senile cataract 07/11/2020   Vitamin B12 deficient megaloblastic anemia 07/11/2020   Right lower quadrant pain 07/11/2020   Pyelonephritis 07/11/2020   Aortic atherosclerosis ([place]) 05/17/2020   B12 deficiency 05/17/2020   Iliac artery aneurysm ([place]) 05/17/2020   Osteoporosis 04/04/2020   Idiopathic neuropathy 01/28/2020   Recurrent falls 01/28/2020   Chronic kidney disease (CKD), stage III (moderate) ([place]) 01/19/2020   Pulmonary emboli ([place]) 01/14/2020   GERD (gastroesophageal reflux disease) 12/22/2019   Essential tremor 12/22/2019   Femoral condyle fracture ([place]) 07/15/2019   Acute pain of right knee  07/15/2019   Gross hematuria 06/14/2019   Hyperlipidemia 11/11/2017   Kidney stone 11/05/2017   Community acquired pneumonia of right upper lobe of lung 11/05/2017   Right ureteral stone 11/05/2017   History of total right hip replacement 03/08/2017   History of prostate cancer 03/08/2017   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   Vertigo 03/01/2017   Right flank discomfort 03/01/2017   Prerenal renal failure 03/01/2017   Personal history of prostate cancer 02/28/2017   High risk medication use 02/28/2017   DDD (degenerative disc disease), thoracic 02/28/2017   Syncope and collapse 02/28/2017   Ataxia 05/18/2016   Vestibular disequilibrium 05/18/2016   Failed spinal cord stimulator ([place]) 03/20/2016   Lung nodule, solitary 05/24/2014   Lumbosacral stenosis 06/05/2013   Malignant neoplasm of prostate ([place]) 06/05/2013   Peripheral edema 06/08/2012   Subcortical microvascular ischemic occlusive disease 08/13/2010   Backache 04/21/2008   History of peptic ulcer disease 04/21/2008   Essential hypertension 10/29/2007  REFERRING PROVIDER: [name], MD   REFERRING DIAG:  M54.50,G89.29 (ICD-10-CM) - Chronic bilateral low back  pain without sciatica  R26.89 (ICD-10-CM) - Other abnormalities of gait and mobility      THERAPY DIAG:  Pain in right hip   Pain in right leg   Difficulty in walking, not elsewhere classified   Muscle weakness (generalized)   ONSET DATE: October 10, 2021 recent fall causing fracture  SUBJECTIVE:                                                                                                                                  SUBJECTIVE STATEMENT: Pt reports he awoke with no pain.  Went to water aerobics this morning and upon exiting the pool, his pain in RLE was 6/10 -has remained elevated (class is from 8a-9a).  "The water in the pool was cold.  I think the weather is affecting me too".    PERTINENT HISTORY:  Stenosis, Rt THA 67 yr ago with h/o  periprosthetic fx & revision in 2022, h/o back surgery  Hard of hearing; can hear out of Left ear   PAIN:  Are you having pain: Yes 6/10 Pain location:  Rt groin and lateral hip  Description: sharp/dul Aggravating factors: Rt WB, walking Relieving factors: rest   PRECAUTIONS: Posterior hip   WEIGHT BEARING RESTRICTIONS No   FALLS:  Has patient fallen in last 6 months? Yes, Number of falls: 1 4/21: no falls recently 6/9: denies new falls 7/7: denies falls 8/7: denies falls 9/15: 3 falls     PATIENT GOALS build up strength, get rid of assistance for toileting and getting in/out of bed, no AD in gait, stairs, Son's wedding in [place] first of June     OBJECTIVE: *All objective findings taken at [facility] unless otherwise noted.    PATIENT SURVEYS:  LEFS 39 4/21: 46 5/19: 43 6/9: 43 7/7: 43  0 on walking 2 blocks, 1 mile & stiars- would like to see these improve in particular 8/7: 42  Still 0/4 on prev stated items 9/15 unchanged from 8/7     COGNITION:          Overall cognitive status: Within functional limits for tasks assessed                        SENSATION:          Bil hand/feet neuropathy unchanged since evaluation      LE MMT:  6/9 measured with handheld dynamometry Normative values: hip flexion 38 lb, knee extension 79 lb   MMT Right 03/06/2022 Right/Left 04/19/22 Rt/Lt 05/17/22 Rt/Lt 06/07/22 Rt//Lt 07/05/22 Rt//Lt 07/26/22 Rt//Lt 9/15  Hip flexion 4/5 11.1/ 24.3 20.9/42.6 34.8/44.2 38.9//42.1  20.1//37.3  Knee flexion 3/5 32.3/37.9 37.7/55.3 38.7/48.6 42.2//54.9    Knee extension 3/5 12.1/42.4 20.7/60.7 25.8/50.5 24.9//50.6 28.2//66.2 22.9//43.5   (Blank rows = not tested)  6/23:unable to lift RtLE against gravity in sidelying  8/7: able to lift knee in clam independently but requires assistance for sidelying  straight leg hip abduction       FUNCTIONAL TESTS:  5 times sit to stand: 38s 4/21: 18s without use of UEs 6/9:16s without use of UEs 8/7: 14s  with UEs, too much groin pain without UEs 9/15: 20s with UE for last 4- balance felt off more than groin pain Berg Balance Scale: 33/56 4/21: 40/50 6/9: 44/50    GAIT:   Comments: SPC in Lt hand, rt leg abducted and externally rotated, antalgic on Rt with pressure placed through cane 4/21: SPC in Lt hand, Rt leg no longer abd /ER; does demo Rt trunk SB in Rt stance phase 6/9: SPC in Lt hand, mild Rt trunk SB in Rt stance phase, maintains upright posture 7/7: with SPC: maint upright posture wihtout Rt sidebend in stance phase; without SPC- cont upright posture and good swing through but does present sidebend to the Rt in Rt stance phase 8/7: with SPC, upright mosture with minimal deviation from midline and minimal use of cane for assistance, antalgic without cane and flexes trunk rather than rotates.  9/15:notbale  maintenance of midline through trunk with ER and abd of LLE         TODAY'S TREATMENT  10/11: Pt seen for aquatic therapy today.  Treatment took place in water 3.25-4.75 ft in depth at the [facility]. Temp of water was 93.  Pt entered/exited the pool via stairs independently with step to pattern and bilat rail.               In water 4 ft deep unsupported:               * Warm up of forward, backward gait and side stepping   * side stepping with arm add with yellow hand buoys;  attempted forward walking lunges - pain at 1 rep-stopped  * Moved to 4+ ft of depth with improved pain level - returned to walking forward/ backward, forward marching with hand buoys under water at sides * with yellow hand buoys at surface:forward walking lunges ; SLS with forward/backward leg kicks and then hip abdct/add ; curtsy lunge x 2 - pain in R hip, stopped.  Rt heel raises (a little help from LLE) x 12  * blue step at 4 ft- L step down and Rt retro step up x 15 with LUE on wall   * at bench - STS with Rt foot back x 10; repeated with blue step under feet x 8 (challenging)  * Rt  lateral step ups with heavy UE on rail x 5  * hamstring stretches at stairs, 20s x 2 each            Pt requires the buoyancy and hydrostatic pressure of water for support, and to offload joints by unweighting joint load by at least 50 % in navel deep water and by at least 75-80% in chest to neck deep water.  Viscosity of the water is needed for resistance of strengthening. Water current perturbations provides challenge to standing balance requiring increased core activation.  9/29: Previous land.  Nu step 5 min L3 Seated clam- ball bw ankles, green tband at knees; also performed reverse (reverse also done without band on ankles) Sit <>stand ball bw knees, cues to avoid pelvic shift to the left Isometric ball press into thigh- small ball, ipisilateral press Seated hamstring stretch Sidelying clams Sidelying hip abd- acitve assisted 3*10 Prone quad set- began adding hip extension  PATIENT EDUCATION:  Education details: Geophysicist/field seismologist of condition, POC, HEP, exercise form/rationale  Person educated: Patient and Spouse Education method: Explanation, Demonstration, Tactile cues, and Verbal cues Education comprehension: verbalized understanding, returned demonstration, verbal cues required, tactile cues required, and needs further education     HOME EXERCISE PROGRAM: [id number]   ASSESSMENT:   CLINICAL IMPRESSION: Pt arrived with elevated pain level.  Pain level remained unchanged in 3.25 ft water and spiked to 9/10 with attempts at forward lunges in this depth.  RLE gave out, but pt was able to right himself after a short moment in a squat position.  Resumed exercises in 4+ ft of water with resolution of pain.  Sit to/from stand at bench with blue step under feet and more weight into RLE continues to be challenging, as is R lateral step ups.  Pt able to demo reciprocal pattern up stairs upon exiting pool. Will continue d/c planning.     OBJECTIVE IMPAIRMENTS Abnormal gait, decreased  activity tolerance, decreased balance, difficulty walking, decreased strength, impaired sensation, improper body mechanics, postural dysfunction, and pain.    ACTIVITY LIMITATIONS cleaning, community activity, driving, meal prep, laundry, and shopping.    PERSONAL FACTORS  see problem list  are also affecting patient's functional outcome.      REHAB POTENTIAL: Good   CLINICAL DECISION MAKING: Unstable/unpredictable   EVALUATION COMPLEXITY: High     GOALS: Goals reviewed with patient? Yes   SHORT TERM GOALS:   Able to enter/exit pool independently with use of hand rails Baseline: will begin training Target date: 04/03/2022 Goal status: Achieved    2.  Good tolerance to exercise without limitations in next day due to pain Baseline: Target date: 04/03/2022 Goal status: achieved    3.  Able to shift weight to perform step taps with UE assist Baseline: unable at eval Target date: 04/03/2022 Goal status: Achieved    4.  Able to ambulate with centered weight distribution Baseline: able with [facility] Target date: 04/03/2022 Goal status: achieved       LONG TERM GOALS:   BERG to improve by MDC of 6 points Baseline: 33 at eval Target date:  07/06/22 Goal status: achieved   2.  Pt will ambulate safely around his home without use of AD Baseline: achieved Goal status: ongoing   3.  Able to navigate stairs in home safely and confidently Baseline:navigating stairs with "up with the good, down with the bad" pattern  Goal status: ongoing   4.  LEFS to improve by MDC Baseline: MDC is 9 points  Goal status: INITIAL   5.  Pt will demo 5TSTS in 20s or less Baseline: 38s at eval  Goal status: Achieved - 04/19/22   6.  Pt will be able to rid home of aid devices Baseline: taken toilet seats out, uses stool in shower but for convenience, not need; still using walker to carry things  Goal status: partially met  7. Start walking around the neighborhood, ultimately without AD  Baseline:  walking but with cane.     Goal status: ongoing   PLAN: PT FREQUENCY: 1-2x/week   PT DURATION: other: 4 months   PLANNED INTERVENTIONS: Therapeutic exercises, Therapeutic activity, Neuromuscular re-education, Balance training, Gait training, Patient/Family education, Joint mobilization, Stair training, Aquatic Therapy, Electrical stimulation, Cryotherapy, Moist heat, Taping, and Manual therapy   PLAN FOR NEXT SESSION: gross large muscle strengthening in lower body  Kerin Perna, PTA 10/09/22 1:54 PM [facility] [address] [place],  [place], [address] Phone: [phone]   Fax:  [phone]

## 2022-10-11 ENCOUNTER — Encounter (HOSPITAL_BASED_OUTPATIENT_CLINIC_OR_DEPARTMENT_OTHER): Payer: Self-pay | Admitting: Physical Therapy

## 2022-10-11 ENCOUNTER — Ambulatory Visit (HOSPITAL_BASED_OUTPATIENT_CLINIC_OR_DEPARTMENT_OTHER): Payer: Medicare Other | Admitting: Physical Therapy

## 2022-10-11 DIAGNOSIS — M79604 Pain in right leg: Secondary | ICD-10-CM | POA: Diagnosis not present

## 2022-10-11 DIAGNOSIS — M25551 Pain in right hip: Secondary | ICD-10-CM | POA: Diagnosis not present

## 2022-10-11 DIAGNOSIS — R262 Difficulty in walking, not elsewhere classified: Secondary | ICD-10-CM | POA: Diagnosis not present

## 2022-10-11 DIAGNOSIS — M6281 Muscle weakness (generalized): Secondary | ICD-10-CM | POA: Diagnosis not present

## 2022-10-11 NOTE — Therapy (Signed)
OUTPATIENT PHYSICAL THERAPY TREATMENT NOTE     Patient Name: Bruce Ball MRN: [id number] DOB:1946-09-13, 76 y.o., male Today's Date: 10/11/2022  PCP: [name], MD REFERRING PROVIDER: [name], MD   PT End of Session - 10/11/22 1146     Visit Number 37    Number of Visits 52    Date for PT Re-Evaluation 10/25/22    Authorization Type MCR    Progress Note Due on Visit 62    PT Start Time 9735    PT Stop Time 1225    PT Time Calculation (min) 40 min    Activity Tolerance Patient tolerated treatment well;Patient limited by pain    Behavior During Therapy [facility] for tasks assessed/performed                     Past Medical History:  Diagnosis Date   Ankylosing spondylitis ([place])    Arthritis of right knee    Basal cell carcinoma    Chronic back pain greater than 3 months duration    Chronic leg pain    CKD (chronic kidney disease), stage II    Coronary artery calcification seen on CAT scan 03/27/2021   Deafness in right ear    Dyspnea    cause by PE   Essential tremor    Extrinsic asthma, unspecified 04/07/2009   PT DENIES   GERD (gastroesophageal reflux disease)    Hearing loss    Hip problem    History of kidney stones    HYPERTENSION 10/29/2007   HYPOTENSION 08/13/2010   LOW BACK PAIN 04/21/2008   NEPHROLITHIASIS, HX OF 04/21/2008   Neuropathy    Osteopenia    Other hyperlipidemia    Peptic ulcer    Pneumonia    PUD, HX OF 04/21/2008   Pulmonary emboli ([place]) 2021   noted on CT 12-2019   Renal disorder    Stage 3 chronic kidney disease (HCC)    Thyroid nodule    Past Surgical History:  Procedure Laterality Date   BACK SURGERY     bone cancer  1970   rt femur removed and steel rod placed   CYSTOSCOPY WITH RETROGRADE PYELOGRAM, URETEROSCOPY AND STENT PLACEMENT Right 11/06/2017   Procedure: CYSTOSCOPY WITH  STENT PLACEMENT RIGHT;  Surgeon: Raynelle Bring, MD;  Location: [facility];  Service: Urology;  Laterality: Right;    CYSTOSCOPY WITH RETROGRADE PYELOGRAM, URETEROSCOPY AND STENT PLACEMENT Left 06/09/2019   Procedure: CYSTOSCOPY WITH RETROGRADE PYELOGRAM, URETEROSCOPY AND STENT PLACEMENT X2;  Surgeon: [name], MD;  Location: [facility];  Service: Urology;  Laterality: Left;   CYSTOSCOPY WITH RETROGRADE PYELOGRAM, URETEROSCOPY AND STENT PLACEMENT Bilateral 03/01/2020   Procedure: CYSTOSCOPY WITH RETROGRADE PYELOGRAM, URETEROSCOPY AND STENT PLACEMENT;  Surgeon: [name], MD;  Location: [facility];  Service: Urology;  Laterality: Bilateral;  75 MINS   CYSTOSCOPY/RETROGRADE/URETEROSCOPY     1 year ago at [place] in [place] Right 11/24/2017   Procedure: CYSTOSCOPY/URETEROSCOPY/ RETROGRADE/STENT REMOVAL;  Surgeon: Raynelle Bring, MD;  Location: [facility];  Service: Urology;  Laterality: Right;   HOLMIUM LASER APPLICATION Bilateral 03/01/9923   Procedure: HOLMIUM LASER APPLICATION;  Surgeon: [name], MD;  Location: [facility];  Service: Urology;  Laterality: Bilateral;   prosatectomy     Patient Active Problem List   Diagnosis Date Noted   Basal cell carcinoma of scalp 09/16/2022   Deposits (accretions) on teeth 09/16/2022   Dry eye syndrome of bilateral lacrimal glands 09/16/2022  Person encountering health services to consult on behalf of another person 09/16/2022   Encounter for issue of repeat prescription 09/16/2022   Encounter for fitting and adjustment of hearing aid 09/16/2022   Irreversible pulpitis 09/16/2022   Nontoxic single thyroid nodule 09/16/2022   Other chronic pain 09/16/2022   Pain in right hand 09/16/2022   Personal history of other venous thrombosis and embolism 09/16/2022   Hypertension 09/16/2022   Thyroid nodule 09/16/2022   Sensorineural hearing loss, bilateral 09/16/2022   Post-COVID syndrome 09/16/2022   Chronic pansinusitis 07/11/2022   Nasal obstruction 07/11/2022   Nasal polyposis 06/11/2022   Non-seasonal allergic rhinitis 06/11/2022    Abrasion of right arm 11/19/2021   Acute blood loss anemia 10/26/2021   PUD (peptic ulcer disease) 10/26/2021   Acute post-operative pain 10/23/2021   Femur fracture, right ([place]) 10/17/2021   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   History of pulmonary embolism 10/10/2021   HTN (hypertension) 10/10/2021   Chronic diastolic CHF (congestive heart failure) ([place]) 10/10/2021   GERD (gastroesophageal reflux disease) 10/10/2021   CKD (chronic kidney disease), stage III ([place]) 10/10/2021   Hyperlipidemia 10/10/2021   CAD (coronary artery disease) 10/10/2021   Periprosthetic fracture of shaft of femur 10/10/2021   Other fatigue 06/05/2021   CAD (coronary artery disease) 33/82/5053   Diastolic CHF ([place]) 97/67/3419   SOB (shortness of breath) on exertion 03/26/2021   Chronic anticoagulation 03/26/2021   Anemia in chronic kidney disease 01/25/2021   Acquired coagulation disorder ([place]) 12/12/2020   Senile purpura ([place]) 12/12/2020   S/P lumbar fusion 08/03/2020   Aftercare following surgery 08/03/2020   Thin skin 07/25/2020   Age-related osteoporosis without current pathological fracture 07/11/2020   Encounter for immunization 07/11/2020   Other specified counseling 07/11/2020   Malignant neoplasm of bone and articular cartilage ([place]) 07/11/2020   Malignant neoplasm of skin 07/11/2020   Meibomian gland dysfunction of right eye, unspecified eyelid 07/11/2020   Nuclear senile cataract 07/11/2020   Vitamin B12 deficient megaloblastic anemia 07/11/2020   Right lower quadrant pain 07/11/2020   Pyelonephritis 07/11/2020   Aortic atherosclerosis ([place]) 05/17/2020   B12 deficiency 05/17/2020   Iliac artery aneurysm ([place]) 05/17/2020   Osteoporosis 04/04/2020   Idiopathic neuropathy 01/28/2020   Recurrent falls 01/28/2020   Chronic kidney disease (CKD), stage III (moderate) ([place]) 01/19/2020   Pulmonary emboli ([place]) 01/14/2020   GERD (gastroesophageal reflux disease) 12/22/2019   Essential tremor  12/22/2019   Femoral condyle fracture ([place]) 07/15/2019   Acute pain of right knee 07/15/2019   Gross hematuria 06/14/2019   Hyperlipidemia 11/11/2017   Kidney stone 11/05/2017   Community acquired pneumonia of right upper lobe of lung 11/05/2017   Right ureteral stone 11/05/2017   History of total right hip replacement 03/08/2017   History of prostate cancer 03/08/2017   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   Vertigo 03/01/2017   Right flank discomfort 03/01/2017   Prerenal renal failure 03/01/2017   Personal history of prostate cancer 02/28/2017   High risk medication use 02/28/2017   DDD (degenerative disc disease), thoracic 02/28/2017   Syncope and collapse 02/28/2017   Ataxia 05/18/2016   Vestibular disequilibrium 05/18/2016   Failed spinal cord stimulator ([place]) 03/20/2016   Lung nodule, solitary 05/24/2014   Lumbosacral stenosis 06/05/2013   Malignant neoplasm of prostate ([place]) 06/05/2013   Peripheral edema 06/08/2012   Subcortical microvascular ischemic occlusive disease 08/13/2010   Backache 04/21/2008   History of peptic ulcer disease 04/21/2008   Essential hypertension 10/29/2007  REFERRING PROVIDER: [name]  O, MD   REFERRING DIAG:  M54.50,G89.29 (ICD-10-CM) - Chronic bilateral low back pain without sciatica  R26.89 (ICD-10-CM) - Other abnormalities of gait and mobility      THERAPY DIAG:  Pain in right hip   Pain in right leg   Difficulty in walking, not elsewhere classified   Muscle weakness (generalized)   ONSET DATE: October 10, 2021 recent fall causing fracture  SUBJECTIVE:                                                                                                                                  SUBJECTIVE STATEMENT: Still experiencing high pain levels, could hardly go up the stairs after working in the pool at [employer]. Can get out of the warm water pool fine here.    PERTINENT HISTORY:  Stenosis, Rt THA 68 yr ago with h/o periprosthetic fx  & revision in 2022, h/o back surgery  Hard of hearing; can hear out of Left ear   PAIN:  Are you having pain: Yes 6/10 Pain location:  Rt groin and lateral hip  Description: sharp/dul Aggravating factors: Rt WB, walking Relieving factors: rest   PRECAUTIONS: Posterior hip   WEIGHT BEARING RESTRICTIONS No   FALLS:  Has patient fallen in last 6 months? Yes, Number of falls: 1 4/21: no falls recently 6/9: denies new falls 7/7: denies falls 8/7: denies falls 9/15: 3 falls     PATIENT GOALS build up strength, get rid of assistance for toileting and getting in/out of bed, no AD in gait, stairs, Son's wedding in [place] first of June     OBJECTIVE: *All objective findings taken at [facility] unless otherwise noted.    PATIENT SURVEYS:  LEFS 39 4/21: 46 5/19: 43 6/9: 43 7/7: 43  0 on walking 2 blocks, 1 mile & stiars- would like to see these improve in particular 8/7: 42  Still 0/4 on prev stated items 9/15 unchanged from 8/7     COGNITION:          Overall cognitive status: Within functional limits for tasks assessed                        SENSATION:          Bil hand/feet neuropathy unchanged since evaluation      LE MMT:  6/9 measured with handheld dynamometry Normative values: hip flexion 38 lb, knee extension 79 lb   MMT Right 03/06/2022 Right/Left 04/19/22 Rt/Lt 05/17/22 Rt/Lt 06/07/22 Rt//Lt 07/05/22 Rt//Lt 07/26/22 Rt//Lt 9/15  Hip flexion 4/5 11.1/ 24.3 20.9/42.6 34.8/44.2 38.9//42.1  20.1//37.3  Knee flexion 3/5 32.3/37.9 37.7/55.3 38.7/48.6 42.2//54.9    Knee extension 3/5 12.1/42.4 20.7/60.7 25.8/50.5 24.9//50.6 28.2//66.2 22.9//43.5   (Blank rows = not tested)  6/23:unable to lift RtLE against gravity in sidelying  8/7: able to lift knee in clam independently but requires assistance for sidelying straight leg hip abduction  FUNCTIONAL TESTS:  5 times sit to stand: 38s 4/21: 18s without use of UEs 6/9:16s without use of UEs 8/7: 14s with UEs, too  much groin pain without UEs 9/15: 20s with UE for last 4- balance felt off more than groin pain Berg Balance Scale: 33/56 4/21: 40/50 6/9: 44/50    GAIT:   Comments: SPC in Lt hand, rt leg abducted and externally rotated, antalgic on Rt with pressure placed through cane 4/21: SPC in Lt hand, Rt leg no longer abd /ER; does demo Rt trunk SB in Rt stance phase 6/9: SPC in Lt hand, mild Rt trunk SB in Rt stance phase, maintains upright posture 7/7: with SPC: maint upright posture wihtout Rt sidebend in stance phase; without SPC- cont upright posture and good swing through but does present sidebend to the Rt in Rt stance phase 8/7: with SPC, upright mosture with minimal deviation from midline and minimal use of cane for assistance, antalgic without cane and flexes trunk rather than rotates.  9/15:notbale  maintenance of midline through trunk with ER and abd of LLE         TODAY'S TREATMENT   Treatment                            10/11/22:  Nu step L5 5 min Roller to Rt ITB, quads & HS- provided small roller to use daily; passive hamstring stretch; STM to iliopsoas in slight hip abd SL hip abd 3*10 assisted by PT SL clams 3*10 unassisted  Bridge on heels, arms crossed over chest, 3s holds   10/11: Pt seen for aquatic therapy today.  Treatment took place in water 3.25-4.75 ft in depth at the [facility]. Temp of water was 93.  Pt entered/exited the pool via stairs independently with step to pattern and bilat rail.               In water 4 ft deep unsupported:               * Warm up of forward, backward gait and side stepping   * side stepping with arm add with yellow hand buoys;  attempted forward walking lunges - pain at 1 rep-stopped  * Moved to 4+ ft of depth with improved pain level - returned to walking forward/ backward, forward marching with hand buoys under water at sides * with yellow hand buoys at surface:forward walking lunges ; SLS with forward/backward leg  kicks and then hip abdct/add ; curtsy lunge x 2 - pain in R hip, stopped.  Rt heel raises (a little help from LLE) x 12  * blue step at 4 ft- L step down and Rt retro step up x 15 with LUE on wall   * at bench - STS with Rt foot back x 10; repeated with blue step under feet x 8 (challenging)  * Rt lateral step ups with heavy UE on rail x 5  * hamstring stretches at stairs, 20s x 2 each            Pt requires the buoyancy and hydrostatic pressure of water for support, and to offload joints by unweighting joint load by at least 50 % in navel deep water and by at least 75-80% in chest to neck deep water.  Viscosity of the water is needed for resistance of strengthening. Water current perturbations provides challenge to standing balance requiring increased core activation.  9/29: Previous land.  Nu step 5 min L3 Seated clam- ball bw ankles, green tband at knees; also performed reverse (reverse also done without band on ankles) Sit <>stand ball bw knees, cues to avoid pelvic shift to the left Isometric ball press into thigh- small ball, ipisilateral press Seated hamstring stretch Sidelying clams Sidelying hip abd- acitve assisted 3*10 Prone quad set- began adding hip extension         PATIENT EDUCATION:  Education details: Anatomy of condition, POC, HEP, exercise form/rationale  Person educated: Patient and Spouse Education method: Explanation, Demonstration, Tactile cues, and Verbal cues Education comprehension: verbalized understanding, returned demonstration, verbal cues required, tactile cues required, and needs further education     HOME EXERCISE PROGRAM: [id number]   ASSESSMENT:   CLINICAL IMPRESSION: Able to perform clams independently against gravity, able to clear pelvis from table in bridge against gravity. Still requires assistance to lift straight leg abd agains gravity but less than prior and does not compensate by rolling pelvis posteriorly.    OBJECTIVE IMPAIRMENTS  Abnormal gait, decreased activity tolerance, decreased balance, difficulty walking, decreased strength, impaired sensation, improper body mechanics, postural dysfunction, and pain.    ACTIVITY LIMITATIONS cleaning, community activity, driving, meal prep, laundry, and shopping.    PERSONAL FACTORS  see problem list  are also affecting patient's functional outcome.       GOALS: Goals reviewed with patient? Yes   SHORT TERM GOALS:   Able to enter/exit pool independently with use of hand rails Baseline: will begin training Target date: 04/03/2022 Goal status: Achieved    2.  Good tolerance to exercise without limitations in next day due to pain Baseline: Target date: 04/03/2022 Goal status: achieved    3.  Able to shift weight to perform step taps with UE assist Baseline: unable at eval Target date: 04/03/2022 Goal status: Achieved    4.  Able to ambulate with centered weight distribution Baseline: able with [facility] Target date: 04/03/2022 Goal status: achieved       LONG TERM GOALS:   BERG to improve by MDC of 6 points Baseline: 33 at eval Target date:  07/06/22 Goal status: achieved   2.  Pt will ambulate safely around his home without use of AD Baseline: achieved Goal status: ongoing   3.  Able to navigate stairs in home safely and confidently Baseline:navigating stairs with "up with the good, down with the bad" pattern  Goal status: ongoing   4.  LEFS to improve by MDC Baseline: MDC is 9 points  Goal status: INITIAL   5.  Pt will demo 5TSTS in 20s or less Baseline: 38s at eval  Goal status: Achieved - 04/19/22   6.  Pt will be able to rid home of aid devices Baseline: taken toilet seats out, uses stool in shower but for convenience, not need; still using walker to carry things  Goal status: partially met  7. Start walking around the neighborhood, ultimately without AD  Baseline: walking but with cane.     Goal status: ongoing   PLAN: PT FREQUENCY: 1-2x/week    PT DURATION: other: 4 months   PLANNED INTERVENTIONS: Therapeutic exercises, Therapeutic activity, Neuromuscular re-education, Balance training, Gait training, Patient/Family education, Joint mobilization, Stair training, Aquatic Therapy, Electrical stimulation, Cryotherapy, Moist heat, Taping, and Manual therapy   PLAN FOR NEXT SESSION: gross large muscle strengthening in lower body  Mirah Nevins C. Doren Kaspar PT, DPT 10/11/22 2:06 PM

## 2022-10-14 DIAGNOSIS — N1832 Chronic kidney disease, stage 3b: Secondary | ICD-10-CM | POA: Diagnosis not present

## 2022-10-14 DIAGNOSIS — N189 Chronic kidney disease, unspecified: Secondary | ICD-10-CM | POA: Diagnosis not present

## 2022-10-16 ENCOUNTER — Ambulatory Visit (HOSPITAL_BASED_OUTPATIENT_CLINIC_OR_DEPARTMENT_OTHER): Payer: Medicare Other | Admitting: Physical Therapy

## 2022-10-18 ENCOUNTER — Telehealth: Payer: Self-pay | Admitting: Family Medicine

## 2022-10-18 ENCOUNTER — Emergency Department (HOSPITAL_BASED_OUTPATIENT_CLINIC_OR_DEPARTMENT_OTHER)
Admission: EM | Admit: 2022-10-18 | Discharge: 2022-10-18 | Disposition: A | Discharge status: Home / Self Care | Payer: Medicare Other | Attending: Emergency Medicine | Admitting: Emergency Medicine

## 2022-10-18 ENCOUNTER — Encounter (HOSPITAL_BASED_OUTPATIENT_CLINIC_OR_DEPARTMENT_OTHER): Payer: Self-pay | Admitting: Emergency Medicine

## 2022-10-18 ENCOUNTER — Other Ambulatory Visit: Payer: Self-pay

## 2022-10-18 ENCOUNTER — Ambulatory Visit (HOSPITAL_BASED_OUTPATIENT_CLINIC_OR_DEPARTMENT_OTHER): Payer: Medicare Other | Admitting: Physical Therapy

## 2022-10-18 DIAGNOSIS — Z7901 Long term (current) use of anticoagulants: Secondary | ICD-10-CM | POA: Diagnosis not present

## 2022-10-18 DIAGNOSIS — N189 Chronic kidney disease, unspecified: Secondary | ICD-10-CM | POA: Diagnosis not present

## 2022-10-18 DIAGNOSIS — R319 Hematuria, unspecified: Secondary | ICD-10-CM | POA: Diagnosis present

## 2022-10-18 DIAGNOSIS — I129 Hypertensive chronic kidney disease with stage 1 through stage 4 chronic kidney disease, or unspecified chronic kidney disease: Secondary | ICD-10-CM | POA: Diagnosis not present

## 2022-10-18 DIAGNOSIS — N3001 Acute cystitis with hematuria: Secondary | ICD-10-CM | POA: Diagnosis not present

## 2022-10-18 DIAGNOSIS — N183 Chronic kidney disease, stage 3 unspecified: Secondary | ICD-10-CM | POA: Insufficient documentation

## 2022-10-18 LAB — URINALYSIS, ROUTINE W REFLEX MICROSCOPIC
Bilirubin Urine: NEGATIVE
Glucose, UA: NEGATIVE mg/dL
Ketones, ur: NEGATIVE mg/dL
Nitrite: POSITIVE — AB
Protein, ur: 30 mg/dL — AB
RBC / HPF: >50 RBC/hpf — ABNORMAL HIGH (ref 0–5)
Specific Gravity, Urine: 1.015 (ref 1.005–1.030)
pH: 5.5 (ref 5.0–8.0)

## 2022-10-18 LAB — CBC
HCT: 46.5 % (ref 39.0–52.0)
Hemoglobin: 15 g/dL (ref 13.0–17.0)
MCH: 28.8 pg (ref 26.0–34.0)
MCHC: 32.3 g/dL (ref 30.0–36.0)
MCV: 89.4 fL (ref 80.0–100.0)
Platelets: 216 10*3/uL (ref 150–400)
RBC: 5.2 MIL/uL (ref 4.22–5.81)
RDW: 13.7 % (ref 11.5–15.5)
WBC: 6.9 10*3/uL (ref 4.0–10.5)
nRBC: 0 % (ref 0.0–0.2)

## 2022-10-18 LAB — BASIC METABOLIC PANEL
Anion gap: 6 (ref 5–15)
BUN: 17 mg/dL (ref 8–23)
CO2: 29 mmol/L (ref 22–32)
Calcium: 9.4 mg/dL (ref 8.9–10.3)
Chloride: 105 mmol/L (ref 98–111)
Creatinine, Ser: 1.49 mg/dL — ABNORMAL HIGH (ref 0.61–1.24)
GFR, Estimated: 48 mL/min — ABNORMAL LOW (ref >60)
Glucose, Bld: 104 mg/dL — ABNORMAL HIGH (ref 70–99)
Potassium: 4.1 mmol/L (ref 3.5–5.1)
Sodium: 140 mmol/L (ref 135–145)

## 2022-10-18 MED ORDER — CEPHALEXIN 500 MG PO CAPS: 500.0000 mg | ORAL_CAPSULE | Freq: Three times a day (TID) | ORAL | 0 refills | Status: DC

## 2022-10-18 NOTE — Telephone Encounter (Signed)
Please check in on him Monday

## 2022-10-18 NOTE — Telephone Encounter (Signed)
Call transferred from Triage. Nurse advised pt to be seen within 4 hours. We did not have any available appts in our office & pt declined another office. Decided to go to [facility]

## 2022-10-18 NOTE — Telephone Encounter (Signed)
Patient experiencing blood in urine. Patient is not having any other symptoms. Declines pain declines fever declines any falls.   Patient has been seen to triaged as per patients request and protocol.   Awaiting notes.

## 2022-10-18 NOTE — ED Provider Notes (Addendum)
[facility] EMERGENCY DEPT Provider Note   CSN: [id number] Arrival date & time: 10/18/22  1350     History  Chief Complaint  Patient presents with   Hematuria    Bruce Ball is a 76 y.o. male.  Patient presents with blood in the urine since last night, no frequency or dysuria.  Patient's had kidney stones before multiple however this does not feel like kidney stone, patient normally has pain with kidney stones.  Patient denies any back or flank pain at this time.  Patient has history of stage III kidney disease.  Patient has history of UTI in the past and responded antibiotics.  Discussed with his daughter over the phone who is a Software engineer as well for further details.       Home Medications Prior to Admission medications   Medication Sig Start Date End Date Taking? Authorizing Provider  cephALEXin (KEFLEX) 500 MG capsule Take 1 capsule (500 mg total) by mouth 3 (three) times daily. 10/18/22  Yes Elnora Morrison, MD  acetaminophen (TYLENOL) 500 MG tablet Take by mouth. 08/06/22   [provider]  apixaban (ELIQUIS) 2.5 MG TABS tablet Take 1 tablet (2.5 mg total) by mouth 2 (two) times daily. 01/25/21   [name], MD  atorvastatin (LIPITOR) 20 MG tablet Take 1 tablet (20 mg total) by mouth at bedtime. 07/19/21   Sueanne Margarita, MD  Calcium Carb-Cholecalciferol 600-10 MG-MCG TABS Take 1 tablet by mouth 2 (two) times daily. 10/22/21 10/22/22  [provider]  carvedilol (COREG) 25 MG tablet Take 12.5 mg by mouth 2 (two) times daily.    [provider]  Cholecalciferol (VITAMIN D3) 50 MCG (2000 UT) capsule Take 2,000 Units by mouth in the morning and at bedtime.     [provider]  cyclobenzaprine (FLEXERIL) 10 MG tablet TAKE ONE-HALF TABLET BY MOUTH AT BEDTIME FOR MUSCLE PAIN * CAUSES HANGOVER, DO NOT DRIVE 8/52/77   [provider]  ferrous sulfate 325 (65 FE) MG tablet Take 325 mg by mouth every Monday, Wednesday, and  Friday.    [provider]  fluticasone (FLONASE) 50 MCG/ACT nasal spray Place 1-2 sprays into both nostrils daily as needed for allergies or rhinitis.    [provider]  gabapentin (NEURONTIN) 300 MG capsule Take 300 mg by mouth at bedtime.    [provider]  pantoprazole (PROTONIX) 40 MG tablet Take 40 mg by mouth 2 (two) times daily before a meal.    [provider]      Allergies    Erythromycin and Lisinopril    Review of Systems   Review of Systems  Constitutional:  Negative for chills and fever.  HENT:  Negative for congestion.   Eyes:  Negative for visual disturbance.  Respiratory:  Negative for shortness of breath.   Cardiovascular:  Negative for chest pain.  Gastrointestinal:  Negative for abdominal pain and vomiting.  Genitourinary:  Positive for hematuria. Negative for dysuria and flank pain.  Musculoskeletal:  Negative for back pain, neck pain and neck stiffness.  Skin:  Negative for rash.  Neurological:  Negative for light-headedness and headaches.    Physical Exam Updated Vital Signs BP (!) 157/76   Pulse 69   Temp 98 F (36.7 C) (Oral)   Resp 18   SpO2 96%  Physical Exam Vitals and nursing note reviewed.  Constitutional:      General: He is not in acute distress.    Appearance: He is well-developed.  HENT:  Head: Normocephalic and atraumatic.     Mouth/Throat:     Mouth: Mucous membranes are moist.  Eyes:     General:        Right eye: No discharge.        Left eye: No discharge.     Conjunctiva/sclera: Conjunctivae normal.  Neck:     Trachea: No tracheal deviation.  Cardiovascular:     Rate and Rhythm: Normal rate.  Pulmonary:     Effort: Pulmonary effort is normal.  Abdominal:     General: There is no distension.     Palpations: Abdomen is soft.     Tenderness: There is no abdominal tenderness. There is no guarding.  Musculoskeletal:     Cervical back: Normal range of motion and neck supple. No  rigidity.  Skin:    General: Skin is warm.     Capillary Refill: Capillary refill takes less than 2 seconds.     Findings: No rash.  Neurological:     General: No focal deficit present.     Mental Status: He is alert.     Cranial Nerves: No cranial nerve deficit.  Psychiatric:        Mood and Affect: Mood normal.     ED Results / Procedures / Treatments   Labs (all labs ordered are listed, but only abnormal results are displayed) Labs Reviewed  URINALYSIS, ROUTINE W REFLEX MICROSCOPIC - Abnormal; Notable for the following components:      Result Value   APPearance CLOUDY (*)    Hgb urine dipstick LARGE (*)    Protein, ur 30 (*)    Nitrite POSITIVE (*)    Leukocytes,Ua LARGE (*)    RBC / HPF >50 (*)    Bacteria, UA MANY (*)    All other components within normal limits  BASIC METABOLIC PANEL - Abnormal; Notable for the following components:   Glucose, Bld 104 (*)    Creatinine, Ser 1.49 (*)    GFR, Estimated 48 (*)    All other components within normal limits  URINE CULTURE  CBC    EKG None  Radiology No results found.  Procedures Procedures    Medications Ordered in ED Medications - No data to display  ED Course/ Medical Decision Making/ A&P                           Medical Decision Making Amount and/or Complexity of Data Reviewed Labs: ordered.  Risk Prescription drug management.   Patient with history of UTI and kidney stones presents with hematuria.  Patient is well-appearing no signs significant dehydration, no signs of serious bacterial/sepsis infection or pyelonephritis.  Discussed differential and reviewed blood work independently showing chronic kidney disease creatinine 1.49 overall similar to previous.  White blood cell count normal, hemoglobin no significant anemia. Urinalysis independently reviewed showing hemoglobin, leukocytes, clumps, nitrite positive.  Discussed with patient and daughter on the phone plan for oral antibiotics follow-up  urine culture and if worsening or no improvement to return to the emergency department as patient may need CT scan of the abdomen or further work-up.  Patient requesting go home is comfortable this plan.        Final Clinical Impression(s) / ED Diagnoses Final diagnoses:  Acute cystitis with hematuria  Chronic kidney disease, unspecified CKD stage    Rx / DC Orders ED Discharge Orders          Ordered    cephALEXin (KEFLEX)  500 MG capsule  3 times daily        10/18/22 2021              Elnora Morrison, MD 10/18/22 2024    Elnora Morrison, MD 10/18/22 2024

## 2022-10-18 NOTE — Discharge Instructions (Signed)
Take antibiotics as prescribed and stay well-hydrated over the weekend. For any pain take Tylenol every 4 hours as needed. For urine culture and get rechecked by your doctor early next week. Turn to the emergency room if you develop persistent fevers, vomiting, not able to keep antibiotics down, flank pain or new concerns.

## 2022-10-18 NOTE — Telephone Encounter (Signed)
FYI

## 2022-10-18 NOTE — ED Notes (Signed)
Patient and wife received discharge instructions. Patient transported to car via w/c with no c/o pain. Instructions given per MD and RN, acknowledged by pt, and wife.

## 2022-10-18 NOTE — ED Triage Notes (Signed)
Hx of kidney stones. Notice dark possible blood in urine last night once. Normal today. Denies pain.

## 2022-10-21 DIAGNOSIS — N1832 Chronic kidney disease, stage 3b: Secondary | ICD-10-CM | POA: Diagnosis not present

## 2022-10-21 DIAGNOSIS — R319 Hematuria, unspecified: Secondary | ICD-10-CM | POA: Diagnosis not present

## 2022-10-21 DIAGNOSIS — Z87442 Personal history of urinary calculi: Secondary | ICD-10-CM | POA: Diagnosis not present

## 2022-10-21 DIAGNOSIS — I129 Hypertensive chronic kidney disease with stage 1 through stage 4 chronic kidney disease, or unspecified chronic kidney disease: Secondary | ICD-10-CM | POA: Diagnosis not present

## 2022-10-21 DIAGNOSIS — D631 Anemia in chronic kidney disease: Secondary | ICD-10-CM | POA: Diagnosis not present

## 2022-10-21 LAB — URINE CULTURE: Culture: >=100000 — AB

## 2022-10-21 NOTE — Telephone Encounter (Signed)
I called pt, pt states he is still having blood in urine, no other sx. Pt did go to kidney doc this morning, doctor ordered CT scan, pt is waiting on call to schedule.

## 2022-10-21 NOTE — Telephone Encounter (Signed)
Pt was seen at [facility] on 10/18/22   Patient Name: Bruce Ball Gender: Male DOB: 1946-02-10 Age: 76 Y 4 M 3 D Return Phone Number: [phone] (Primary), [phone] (Secondary) Address: City/ State/ Zip: [place]  [address] Client [facility] Provider [name]- MD Contact Type Call Who Is Calling Patient / Member / Family / Caregiver Call Type Triage / Clinical Relationship To Patient Self Return Phone Number [phone] (Primary) Chief Complaint Blood In Stool Reason for Call Symptomatic / Request for [place] states that she need to have a Patient Triage. Patient states that he passed blood and that he has kidney stones. Translation No Nurse Assessment Nurse: Primary school teacher, RN, Joelene Millin Date/Time (Eastern Time): 10/18/2022 12:20:53 PM Confirm and document reason for call. If symptomatic, describe symptoms. ---Caller states he has kidney stones and thinks he passed blood yesterday 2/2 dark urine. Denies fever pain. Does the patient have any new or worsening symptoms? ---Yes Will a triage be completed? ---Yes Related visit to physician within the last 2 weeks? ---No Does the PT have any chronic conditions? (i.e. diabetes, asthma, this includes High risk factors for pregnancy, etc.) ---Yes List chronic conditions. ---Renal stones Is this a behavioral health or substance abuse call? ---No Guidelines Guideline Title Affirmed Question Affirmed Notes Nurse Date/Time (Eastern Time) Urine - Blood In Taking Coumadin (warfarin) or other strong blood thinner, or known bleeding disorder (e.g., thrombocytopenia) Schreffler, RN, Joelene Millin 10/18/2022 12:24:08 PM Disp. Time Eilene Ghazi Time) Disposition Final User 10/18/2022 12:32:56 PM See HCP within 4 Hours (or PCP triage) Yes Schreffler, RN, Joelene Millin Final  Disposition 10/18/2022 12:32:56 PM See HCP within 4 Hours (or PCP triage) Yes Schreffler, RN, Renea Ee Disagree/Comply Comply Caller Understands Yes PreDisposition Did not know what to do Care Advice Given Per Guideline * IF OFFICE WILL BE OPEN: You need to be seen within the next 3 or 4 hours. Call your doctor (or NP/PA) now or as soon as the office opens. SEE HCP (OR PCP TRIAGE) WITHIN 4 HOURS: BRING MEDICINES: * Please bring a list of your current medicines when you go to see the doctor. * Fever occurs CALL BACK IF: * You become worse CARE ADVICE given per Urine, Blood In (Adult) guideline. Referrals REFERRED TO PCP OFFICE Warm transfer to backline

## 2022-10-21 NOTE — Telephone Encounter (Signed)
Okay glad he has been evaluated/undergoing evaluation

## 2022-10-22 ENCOUNTER — Telehealth (HOSPITAL_BASED_OUTPATIENT_CLINIC_OR_DEPARTMENT_OTHER): Payer: Self-pay

## 2022-10-22 NOTE — Telephone Encounter (Signed)
Post ED Visit - Positive Culture Follow-up  Culture report reviewed by antimicrobial stewardship pharmacist: [facility] '[x]'$  Esmeralda Arthur, Pharm.D. '[]'$  Heide Guile, Pharm.D., BCPS AQ-ID '[]'$  Parks Neptune, Pharm.D., BCPS '[]'$  Alycia Rossetti, Pharm.D., BCPS '[]'$  Rigby, Pharm.D., BCPS, AAHIVP '[]'$  Legrand Como, Pharm.D., BCPS, AAHIVP '[]'$  Salome Arnt, PharmD, BCPS '[]'$  Johnnette Gourd, PharmD, BCPS '[]'$  Hughes Better, PharmD, BCPS '[]'$  Leeroy Cha, PharmD '[]'$  Laqueta Linden, PharmD, BCPS '[]'$  Albertina Parr, PharmD  [place] Team '[]'$  Leodis Sias, PharmD '[]'$  Lindell Spar, PharmD '[]'$  Royetta Asal, PharmD '[]'$  Graylin Shiver, Rph '[]'$  Rema Fendt) Glennon Mac, PharmD '[]'$  Arlyn Dunning, PharmD '[]'$  Netta Cedars, PharmD '[]'$  Dia Sitter, PharmD '[]'$  Leone Haven, PharmD '[]'$  Gretta Arab, PharmD '[]'$  Theodis Shove, PharmD '[]'$  Peggyann Juba, PharmD '[]'$  Reuel Boom, PharmD   Positive urine culture Treated with Cephalexin, organism sensitive to the same and no further patient follow-up is required at this time.  Glennon Hamilton 10/22/2022, 11:27 AM

## 2022-10-23 ENCOUNTER — Ambulatory Visit (HOSPITAL_BASED_OUTPATIENT_CLINIC_OR_DEPARTMENT_OTHER): Payer: Medicare Other | Admitting: Physical Therapy

## 2022-10-23 ENCOUNTER — Encounter (HOSPITAL_BASED_OUTPATIENT_CLINIC_OR_DEPARTMENT_OTHER): Payer: Self-pay | Admitting: Physical Therapy

## 2022-10-23 DIAGNOSIS — M6281 Muscle weakness (generalized): Secondary | ICD-10-CM | POA: Diagnosis not present

## 2022-10-23 DIAGNOSIS — R262 Difficulty in walking, not elsewhere classified: Secondary | ICD-10-CM | POA: Diagnosis not present

## 2022-10-23 DIAGNOSIS — M79604 Pain in right leg: Secondary | ICD-10-CM | POA: Diagnosis not present

## 2022-10-23 DIAGNOSIS — M25551 Pain in right hip: Secondary | ICD-10-CM

## 2022-10-23 NOTE — Therapy (Signed)
OUTPATIENT PHYSICAL THERAPY TREATMENT NOTE     Patient Name: Bruce Ball MRN: [id number] DOB:[birth year], 76 y.o., male Today's Date: 10/23/2022  PCP: [name], MD REFERRING PROVIDER: [name], MD   PT End of Session - 10/23/22 1133     Visit Number 48    Number of Visits 12    Date for PT Re-Evaluation 10/25/22    Authorization Type MCR    Progress Note Due on Visit 66    PT Start Time 1115    PT Stop Time 1155    PT Time Calculation (min) 40 min    Activity Tolerance Patient limited by pain    Behavior During Therapy [facility] for tasks assessed/performed                     Past Medical History:  Diagnosis Date   Ankylosing spondylitis ([place])    Arthritis of right knee    Basal cell carcinoma    Chronic back pain greater than 3 months duration    Chronic leg pain    CKD (chronic kidney disease), stage II    Coronary artery calcification seen on CAT scan 03/27/2021   Deafness in right ear    Dyspnea    cause by PE   Essential tremor    Extrinsic asthma, unspecified 04/07/2009   PT DENIES   GERD (gastroesophageal reflux disease)    Hearing loss    Hip problem    History of kidney stones    HYPERTENSION 10/29/2007   HYPOTENSION 08/13/2010   LOW BACK PAIN 04/21/2008   NEPHROLITHIASIS, HX OF 04/21/2008   Neuropathy    Osteopenia    Other hyperlipidemia    Peptic ulcer    Pneumonia    PUD, HX OF 04/21/2008   Pulmonary emboli ([place]) 2021   noted on CT 12-2019   Renal disorder    Stage 3 chronic kidney disease (HCC)    Thyroid nodule    Past Surgical History:  Procedure Laterality Date   BACK SURGERY     bone cancer  1970   rt femur removed and steel rod placed   CYSTOSCOPY WITH RETROGRADE PYELOGRAM, URETEROSCOPY AND STENT PLACEMENT Right 11/06/2017   Procedure: CYSTOSCOPY WITH  STENT PLACEMENT RIGHT;  Surgeon: Raynelle Bring, MD;  Location: [facility];  Service: Urology;  Laterality: Right;   CYSTOSCOPY WITH RETROGRADE PYELOGRAM,  URETEROSCOPY AND STENT PLACEMENT Left 06/09/2019   Procedure: CYSTOSCOPY WITH RETROGRADE PYELOGRAM, URETEROSCOPY AND STENT PLACEMENT X2;  Surgeon: [name], MD;  Location: [facility];  Service: Urology;  Laterality: Left;   CYSTOSCOPY WITH RETROGRADE PYELOGRAM, URETEROSCOPY AND STENT PLACEMENT Bilateral 03/01/2020   Procedure: CYSTOSCOPY WITH RETROGRADE PYELOGRAM, URETEROSCOPY AND STENT PLACEMENT;  Surgeon: [name], MD;  Location: [facility];  Service: Urology;  Laterality: Bilateral;  75 MINS   CYSTOSCOPY/RETROGRADE/URETEROSCOPY     1 year ago at [place] Right 11/24/2017   Procedure: CYSTOSCOPY/URETEROSCOPY/ RETROGRADE/STENT REMOVAL;  Surgeon: Raynelle Bring, MD;  Location: [facility];  Service: Urology;  Laterality: Right;   HOLMIUM LASER APPLICATION Bilateral 05/04/2562   Procedure: HOLMIUM LASER APPLICATION;  Surgeon: [name], MD;  Location: [facility];  Service: Urology;  Laterality: Bilateral;   prosatectomy     Patient Active Problem List   Diagnosis Date Noted   Basal cell carcinoma of scalp 09/16/2022   Deposits (accretions) on teeth 09/16/2022   Dry eye syndrome of bilateral lacrimal glands 09/16/2022   Person encountering health  services to consult on behalf of another person 09/16/2022   Encounter for issue of repeat prescription 09/16/2022   Encounter for fitting and adjustment of hearing aid 09/16/2022   Irreversible pulpitis 09/16/2022   Nontoxic single thyroid nodule 09/16/2022   Other chronic pain 09/16/2022   Pain in right hand 09/16/2022   Personal history of other venous thrombosis and embolism 09/16/2022   Hypertension 09/16/2022   Thyroid nodule 09/16/2022   Sensorineural hearing loss, bilateral 09/16/2022   Post-COVID syndrome 09/16/2022   Chronic pansinusitis 07/11/2022   Nasal obstruction 07/11/2022   Nasal polyposis 06/11/2022   Non-seasonal allergic rhinitis 06/11/2022   Abrasion of right arm 11/19/2021    Acute blood loss anemia 10/26/2021   PUD (peptic ulcer disease) 10/26/2021   Acute post-operative pain 10/23/2021   Femur fracture, right ([place]) 10/17/2021   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   History of pulmonary embolism 10/10/2021   HTN (hypertension) 10/10/2021   Chronic diastolic CHF (congestive heart failure) ([place]) 10/10/2021   GERD (gastroesophageal reflux disease) 10/10/2021   CKD (chronic kidney disease), stage III ([place]) 10/10/2021   Hyperlipidemia 10/10/2021   CAD (coronary artery disease) 10/10/2021   Periprosthetic fracture of shaft of femur 10/10/2021   Other fatigue 06/05/2021   CAD (coronary artery disease) 71/69/6789   Diastolic CHF ([place]) 38/09/1750   SOB (shortness of breath) on exertion 03/26/2021   Chronic anticoagulation 03/26/2021   Anemia in chronic kidney disease 01/25/2021   Acquired coagulation disorder ([place]) 12/12/2020   Senile purpura ([place]) 12/12/2020   S/P lumbar fusion 08/03/2020   Aftercare following surgery 08/03/2020   Thin skin 07/25/2020   Age-related osteoporosis without current pathological fracture 07/11/2020   Encounter for immunization 07/11/2020   Other specified counseling 07/11/2020   Malignant neoplasm of bone and articular cartilage ([place]) 07/11/2020   Malignant neoplasm of skin 07/11/2020   Meibomian gland dysfunction of right eye, unspecified eyelid 07/11/2020   Nuclear senile cataract 07/11/2020   Vitamin B12 deficient megaloblastic anemia 07/11/2020   Right lower quadrant pain 07/11/2020   Pyelonephritis 07/11/2020   Aortic atherosclerosis ([place]) 05/17/2020   B12 deficiency 05/17/2020   Iliac artery aneurysm ([place]) 05/17/2020   Osteoporosis 04/04/2020   Idiopathic neuropathy 01/28/2020   Recurrent falls 01/28/2020   Chronic kidney disease (CKD), stage III (moderate) ([place]) 01/19/2020   Pulmonary emboli ([place]) 01/14/2020   GERD (gastroesophageal reflux disease) 12/22/2019   Essential tremor 12/22/2019   Femoral condyle  fracture ([place]) 07/15/2019   Acute pain of right knee 07/15/2019   Gross hematuria 06/14/2019   Hyperlipidemia 11/11/2017   Kidney stone 11/05/2017   Community acquired pneumonia of right upper lobe of lung 11/05/2017   Right ureteral stone 11/05/2017   History of total right hip replacement 03/08/2017   History of prostate cancer 03/08/2017   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   Vertigo 03/01/2017   Right flank discomfort 03/01/2017   Prerenal renal failure 03/01/2017   Personal history of prostate cancer 02/28/2017   High risk medication use 02/28/2017   DDD (degenerative disc disease), thoracic 02/28/2017   Syncope and collapse 02/28/2017   Ataxia 05/18/2016   Vestibular disequilibrium 05/18/2016   Failed spinal cord stimulator ([place]) 03/20/2016   Lung nodule, solitary 05/24/2014   Lumbosacral stenosis 06/05/2013   Malignant neoplasm of prostate ([place]) 06/05/2013   Peripheral edema 06/08/2012   Subcortical microvascular ischemic occlusive disease 08/13/2010   Backache 04/21/2008   History of peptic ulcer disease 04/21/2008   Essential hypertension 10/29/2007  REFERRING PROVIDER: [name], MD  REFERRING DIAG:  M54.50,G89.29 (ICD-10-CM) - Chronic bilateral low back pain without sciatica  R26.89 (ICD-10-CM) - Other abnormalities of gait and mobility      THERAPY DIAG:  Pain in right hip   Pain in right leg   Difficulty in walking, not elsewhere classified   Muscle weakness (generalized)   ONSET DATE: October 10, 2021 recent fall causing fracture  SUBJECTIVE:                                                                                                                                  SUBJECTIVE STATEMENT: "I'm feeling much better than last week".  Pt reports he has been dealing with kidney stones as well, "it radiates into my groin/ hips" He returned to water aerobics yesterday and today.    PERTINENT HISTORY:  Stenosis, Rt THA 4 yr ago with h/o  periprosthetic fx & revision in 2022, h/o back surgery  Hard of hearing; can hear out of Left ear   PAIN:  Are you having pain: Yes 6310 Pain location:  Rt groin and lateral hip  Description: sharp/dull Aggravating factors: Rt WB, walking Relieving factors: rest   PRECAUTIONS: Posterior hip   WEIGHT BEARING RESTRICTIONS No   FALLS:  Has patient fallen in last 6 months? Yes, Number of falls: 1 4/21: no falls recently 6/9: denies new falls 7/7: denies falls 8/7: denies falls 9/15: 3 falls     PATIENT GOALS build up strength, get rid of assistance for toileting and getting in/out of bed, no AD in gait, stairs, Son's wedding in [place] first of June     OBJECTIVE: *All objective findings taken at [facility] unless otherwise noted.    PATIENT SURVEYS:  LEFS 39 4/21: 46 5/19: 43 6/9: 43 7/7: 43  0 on walking 2 blocks, 1 mile & stiars- would like to see these improve in particular 8/7: 42  Still 0/4 on prev stated items 9/15 unchanged from 8/7     COGNITION:          Overall cognitive status: Within functional limits for tasks assessed                        SENSATION:          Bil hand/feet neuropathy unchanged since evaluation      LE MMT:  6/9 measured with handheld dynamometry Normative values: hip flexion 38 lb, knee extension 79 lb   MMT Right 03/06/2022 Right/Left 04/19/22 Rt/Lt 05/17/22 Rt/Lt 06/07/22 Rt//Lt 07/05/22 Rt//Lt 07/26/22 Rt//Lt 9/15  Hip flexion 4/5 11.1/ 24.3 20.9/42.6 34.8/44.2 38.9//42.1  20.1//37.3  Knee flexion 3/5 32.3/37.9 37.7/55.3 38.7/48.6 42.2//54.9    Knee extension 3/5 12.1/42.4 20.7/60.7 25.8/50.5 24.9//50.6 28.2//66.2 22.9//43.5   (Blank rows = not tested)  6/23:unable to lift RtLE against gravity in sidelying  8/7: able to lift knee in clam independently but requires assistance for sidelying straight leg hip abduction  FUNCTIONAL TESTS:  5 times sit to stand: 38s 4/21: 18s without use of UEs 6/9:16s without use of UEs 8/7:  14s with UEs, too much groin pain without UEs 9/15: 20s with UE for last 4- balance felt off more than groin pain Berg Balance Scale: 33/56 4/21: 40/50 6/9: 44/50    GAIT:   Comments: SPC in Lt hand, rt leg abducted and externally rotated, antalgic on Rt with pressure placed through cane 4/21: SPC in Lt hand, Rt leg no longer abd /ER; does demo Rt trunk SB in Rt stance phase 6/9: SPC in Lt hand, mild Rt trunk SB in Rt stance phase, maintains upright posture 7/7: with SPC: maint upright posture wihtout Rt sidebend in stance phase; without SPC- cont upright posture and good swing through but does present sidebend to the Rt in Rt stance phase 8/7: with SPC, upright mosture with minimal deviation from midline and minimal use of cane for assistance, antalgic without cane and flexes trunk rather than rotates.  9/15:notbale  maintenance of midline through trunk with ER and abd of LLE         TODAY'S TREATMENT   10/25: Pt seen for aquatic therapy today.  Treatment took place in water 3.25-4.75 ft in depth at the [facility]. Temp of water was 93.  Pt entered/exited the pool via stairs independently with step to pattern and bilat rail.               In water 4 ft deep unsupported:               * Warm up of forward, backward gait and side stepping   * SLS x 12 sec each LE  * forward walking lunges - RLE buckled (pain to 7/10)- completed in deeper water with greater ease, but painful  * SLS with 3 way LE kick holding yellow hand floats  * holding wall at 3 ft 6": R LE heel raises x 20  * at bench - STS with Rt foot back x 1 (pain up to 9/10); equal feet x 10   * hamstring stretch at stairs x 30 sec x 2  * calf stretch (runner's stretch)  * return to walking in deeper water for recovery   Treatment                            10/11/22:  Nu step L5 5 min Roller to Rt ITB, quads & HS- provided small roller to use daily; passive hamstring stretch; STM to iliopsoas in slight hip  abd SL hip abd 3*10 assisted by PT SL clams 3*10 unassisted  Bridge on heels, arms crossed over chest, 3s holds   10/11: Pt seen for aquatic therapy today.  Treatment took place in water 3.25-4.75 ft in depth at the [place] [facility]. Temp of water was 93.  Pt entered/exited the pool via stairs independently with step to pattern and bilat rail.               In water 4 ft deep unsupported:               * Warm up of forward, backward gait and side stepping   * side stepping with arm add with yellow hand buoys;  attempted forward walking lunges - pain at 1 rep-stopped  * Moved to 4+ ft of depth with improved pain level - returned to walking forward/ backward, forward marching with  hand buoys under water at sides * with yellow hand buoys at surface:forward walking lunges ; SLS with forward/backward leg kicks and then hip abdct/add ; curtsy lunge x 2 - pain in R hip, stopped.  Rt heel raises (a little help from LLE) x 12  * blue step at 4 ft- L step down and Rt retro step up x 15 with LUE on wall   * at bench - STS with Rt foot back x 10; repeated with blue step under feet x 8 (challenging)  * Rt lateral step ups with heavy UE on rail x 5  * hamstring stretches at stairs, 20s x 2 each            Pt requires the buoyancy and hydrostatic pressure of water for support, and to offload joints by unweighting joint load by at least 50 % in navel deep water and by at least 75-80% in chest to neck deep water.  Viscosity of the water is needed for resistance of strengthening. Water current perturbations provides challenge to standing balance requiring increased core activation.  9/29: Previous land.  Nu step 5 min L3 Seated clam- ball bw ankles, green tband at knees; also performed reverse (reverse also done without band on ankles) Sit <>stand ball bw knees, cues to avoid pelvic shift to the left Isometric ball press into thigh- small ball, ipisilateral press Seated hamstring  stretch Sidelying clams Sidelying hip abd- acitve assisted 3*10 Prone quad set- began adding hip extension         PATIENT EDUCATION:  Education details: aquatics modifications   Person educated: Patient and Spouse Education method: Explanation, Demonstration, Tactile cues, and Verbal cues Education comprehension: verbalized understanding, returned demonstration, verbal cues required, tactile cues required, and needs further education     HOME EXERCISE PROGRAM: [id number]   ASSESSMENT:   CLINICAL IMPRESSION: Pt arrived with lower pain level, but had limited tolerance for strengthening exercises in 4 ft of water.  Episodes of RLE buckling and hip pain spikes of 9/10 with increased loading into RLE (SLS; lunges).  Pain eased with rest and submerging body in deeper water.  No goals met this date.  PT to assess goals next visit.   OBJECTIVE IMPAIRMENTS Abnormal gait, decreased activity tolerance, decreased balance, difficulty walking, decreased strength, impaired sensation, improper body mechanics, postural dysfunction, and pain.    ACTIVITY LIMITATIONS cleaning, community activity, driving, meal prep, laundry, and shopping.    PERSONAL FACTORS  see problem list  are also affecting patient's functional outcome.       GOALS: Goals reviewed with patient? Yes   SHORT TERM GOALS:   Able to enter/exit pool independently with use of hand rails Baseline: will begin training Target date: 04/03/2022 Goal status: Achieved    2.  Good tolerance to exercise without limitations in next day due to pain Baseline: Target date: 04/03/2022 Goal status: achieved    3.  Able to shift weight to perform step taps with UE assist Baseline: unable at eval Target date: 04/03/2022 Goal status: Achieved    4.  Able to ambulate with centered weight distribution Baseline: able with [facility] Target date: 04/03/2022 Goal status: achieved       LONG TERM GOALS:   BERG to improve by MDC of 6  points Baseline: 33 at eval Target date:  07/06/22 Goal status: achieved   2.  Pt will ambulate safely around his home without use of AD Baseline: achieved Goal status: ongoing   3.  Able to  navigate stairs in home safely and confidently Baseline:navigating stairs with "up with the good, down with the bad" pattern  Goal status: ongoing   4.  LEFS to improve by MDC Baseline: MDC is 9 points  Goal status: INITIAL   5.  Pt will demo 5TSTS in 20s or less Baseline: 38s at eval  Goal status: Achieved - 04/19/22   6.  Pt will be able to rid home of aid devices Baseline: taken toilet seats out, uses stool in shower but for convenience, not need; still using walker to carry things  Goal status: partially met  7. Start walking around the neighborhood, ultimately without AD  Baseline: walking but with cane.     Goal status: ongoing   PLAN: PT FREQUENCY: 1-2x/week   PT DURATION: other: 4 months   PLANNED INTERVENTIONS: Therapeutic exercises, Therapeutic activity, Neuromuscular re-education, Balance training, Gait training, Patient/Family education, Joint mobilization, Stair training, Aquatic Therapy, Electrical stimulation, Cryotherapy, Moist heat, Taping, and Manual therapy   PLAN FOR NEXT SESSION: d/c planning.  Kerin Perna, PTA 10/23/22 12:07 PM [facility] [address] [place], [place], [address] Phone: [phone]   Fax:  [phone]

## 2022-10-25 ENCOUNTER — Other Ambulatory Visit: Payer: Self-pay | Admitting: Nephrology

## 2022-10-25 ENCOUNTER — Ambulatory Visit (HOSPITAL_BASED_OUTPATIENT_CLINIC_OR_DEPARTMENT_OTHER): Payer: Medicare Other | Admitting: Physical Therapy

## 2022-10-25 ENCOUNTER — Encounter (HOSPITAL_BASED_OUTPATIENT_CLINIC_OR_DEPARTMENT_OTHER): Payer: Self-pay | Admitting: Physical Therapy

## 2022-10-25 DIAGNOSIS — R262 Difficulty in walking, not elsewhere classified: Secondary | ICD-10-CM

## 2022-10-25 DIAGNOSIS — M25551 Pain in right hip: Secondary | ICD-10-CM | POA: Diagnosis not present

## 2022-10-25 DIAGNOSIS — Z87442 Personal history of urinary calculi: Secondary | ICD-10-CM

## 2022-10-25 DIAGNOSIS — M6281 Muscle weakness (generalized): Secondary | ICD-10-CM | POA: Diagnosis not present

## 2022-10-25 DIAGNOSIS — M79604 Pain in right leg: Secondary | ICD-10-CM | POA: Diagnosis not present

## 2022-10-25 DIAGNOSIS — R319 Hematuria, unspecified: Secondary | ICD-10-CM

## 2022-10-25 DIAGNOSIS — N1832 Chronic kidney disease, stage 3b: Secondary | ICD-10-CM

## 2022-10-25 NOTE — Therapy (Addendum)
OUTPATIENT PHYSICAL THERAPY TREATMENT NOTE     Patient Name: Bruce Ball MRN: [id number] DOB:10-20-1946, 76 y.o., male Today's Date: 10/25/2022  PCP: [name], MD REFERRING PROVIDER: [name], MD   PT End of Session - 10/25/22 1145     Visit Number 37    Number of Visits 48    Date for PT Re-Evaluation 10/25/22    Authorization Type MCR    Progress Note Due on Visit 44    PT Start Time 1140    PT Stop Time 1223    PT Time Calculation (min) 43 min    Activity Tolerance Patient limited by pain    Behavior During Therapy [facility] for tasks assessed/performed                      Past Medical History:  Diagnosis Date   Ankylosing spondylitis ([place])    Arthritis of right knee    Basal cell carcinoma    Chronic back pain greater than 3 months duration    Chronic leg pain    CKD (chronic kidney disease), stage II    Coronary artery calcification seen on CAT scan 03/27/2021   Deafness in right ear    Dyspnea    cause by PE   Essential tremor    Extrinsic asthma, unspecified 04/07/2009   PT DENIES   GERD (gastroesophageal reflux disease)    Hearing loss    Hip problem    History of kidney stones    HYPERTENSION 10/29/2007   HYPOTENSION 08/13/2010   LOW BACK PAIN 04/21/2008   NEPHROLITHIASIS, HX OF 04/21/2008   Neuropathy    Osteopenia    Other hyperlipidemia    Peptic ulcer    Pneumonia    PUD, HX OF 04/21/2008   Pulmonary emboli ([place]) 2021   noted on CT 12-2019   Renal disorder    Stage 3 chronic kidney disease (HCC)    Thyroid nodule    Past Surgical History:  Procedure Laterality Date   BACK SURGERY     bone cancer  1970   rt femur removed and steel rod placed   CYSTOSCOPY WITH RETROGRADE PYELOGRAM, URETEROSCOPY AND STENT PLACEMENT Right 11/06/2017   Procedure: CYSTOSCOPY WITH  STENT PLACEMENT RIGHT;  Surgeon: Raynelle Bring, MD;  Location: [facility];  Service: Urology;  Laterality: Right;   CYSTOSCOPY WITH RETROGRADE PYELOGRAM,  URETEROSCOPY AND STENT PLACEMENT Left 06/09/2019   Procedure: CYSTOSCOPY WITH RETROGRADE PYELOGRAM, URETEROSCOPY AND STENT PLACEMENT X2;  Surgeon: [name], MD;  Location: [facility];  Service: Urology;  Laterality: Left;   CYSTOSCOPY WITH RETROGRADE PYELOGRAM, URETEROSCOPY AND STENT PLACEMENT Bilateral 03/01/2020   Procedure: CYSTOSCOPY WITH RETROGRADE PYELOGRAM, URETEROSCOPY AND STENT PLACEMENT;  Surgeon: [name], MD;  Location: [facility];  Service: Urology;  Laterality: Bilateral;  75 MINS   CYSTOSCOPY/RETROGRADE/URETEROSCOPY     1 year ago at [place] in [place] Right 11/24/2017   Procedure: CYSTOSCOPY/URETEROSCOPY/ RETROGRADE/STENT REMOVAL;  Surgeon: Raynelle Bring, MD;  Location: [facility];  Service: Urology;  Laterality: Right;   HOLMIUM LASER APPLICATION Bilateral 6/0/7371   Procedure: HOLMIUM LASER APPLICATION;  Surgeon: [name], MD;  Location: [facility];  Service: Urology;  Laterality: Bilateral;   prosatectomy     Patient Active Problem List   Diagnosis Date Noted   Basal cell carcinoma of scalp 09/16/2022   Deposits (accretions) on teeth 09/16/2022   Dry eye syndrome of bilateral lacrimal glands 09/16/2022   Person encountering  health services to consult on behalf of another person 09/16/2022   Encounter for issue of repeat prescription 09/16/2022   Encounter for fitting and adjustment of hearing aid 09/16/2022   Irreversible pulpitis 09/16/2022   Nontoxic single thyroid nodule 09/16/2022   Other chronic pain 09/16/2022   Pain in right hand 09/16/2022   Personal history of other venous thrombosis and embolism 09/16/2022   Hypertension 09/16/2022   Thyroid nodule 09/16/2022   Sensorineural hearing loss, bilateral 09/16/2022   Post-COVID syndrome 09/16/2022   Chronic pansinusitis 07/11/2022   Nasal obstruction 07/11/2022   Nasal polyposis 06/11/2022   Non-seasonal allergic rhinitis 06/11/2022   Abrasion of right arm 11/19/2021    Acute blood loss anemia 10/26/2021   PUD (peptic ulcer disease) 10/26/2021   Acute post-operative pain 10/23/2021   Femur fracture, right ([place]) 10/17/2021   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   History of pulmonary embolism 10/10/2021   HTN (hypertension) 10/10/2021   Chronic diastolic CHF (congestive heart failure) ([place]) 10/10/2021   GERD (gastroesophageal reflux disease) 10/10/2021   CKD (chronic kidney disease), stage III ([place]) 10/10/2021   Hyperlipidemia 10/10/2021   CAD (coronary artery disease) 10/10/2021   Periprosthetic fracture of shaft of femur 10/10/2021   Other fatigue 06/05/2021   CAD (coronary artery disease) 02/63/7858   Diastolic CHF ([place]) 85/01/7740   SOB (shortness of breath) on exertion 03/26/2021   Chronic anticoagulation 03/26/2021   Anemia in chronic kidney disease 01/25/2021   Acquired coagulation disorder ([place]) 12/12/2020   Senile purpura ([place]) 12/12/2020   S/P lumbar fusion 08/03/2020   Aftercare following surgery 08/03/2020   Thin skin 07/25/2020   Age-related osteoporosis without current pathological fracture 07/11/2020   Encounter for immunization 07/11/2020   Other specified counseling 07/11/2020   Malignant neoplasm of bone and articular cartilage ([place]) 07/11/2020   Malignant neoplasm of skin 07/11/2020   Meibomian gland dysfunction of right eye, unspecified eyelid 07/11/2020   Nuclear senile cataract 07/11/2020   Vitamin B12 deficient megaloblastic anemia 07/11/2020   Right lower quadrant pain 07/11/2020   Pyelonephritis 07/11/2020   Aortic atherosclerosis ([place]) 05/17/2020   B12 deficiency 05/17/2020   Iliac artery aneurysm ([place]) 05/17/2020   Osteoporosis 04/04/2020   Idiopathic neuropathy 01/28/2020   Recurrent falls 01/28/2020   Chronic kidney disease (CKD), stage III (moderate) ([place]) 01/19/2020   Pulmonary emboli ([place]) 01/14/2020   GERD (gastroesophageal reflux disease) 12/22/2019   Essential tremor 12/22/2019   Femoral condyle  fracture ([place]) 07/15/2019   Acute pain of right knee 07/15/2019   Gross hematuria 06/14/2019   Hyperlipidemia 11/11/2017   Kidney stone 11/05/2017   Community acquired pneumonia of right upper lobe of lung 11/05/2017   Right ureteral stone 11/05/2017   History of total right hip replacement 03/08/2017   History of prostate cancer 03/08/2017   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   Vertigo 03/01/2017   Right flank discomfort 03/01/2017   Prerenal renal failure 03/01/2017   Personal history of prostate cancer 02/28/2017   High risk medication use 02/28/2017   DDD (degenerative disc disease), thoracic 02/28/2017   Syncope and collapse 02/28/2017   Ataxia 05/18/2016   Vestibular disequilibrium 05/18/2016   Failed spinal cord stimulator ([place]) 03/20/2016   Lung nodule, solitary 05/24/2014   Lumbosacral stenosis 06/05/2013   Malignant neoplasm of prostate ([place]) 06/05/2013   Peripheral edema 06/08/2012   Subcortical microvascular ischemic occlusive disease 08/13/2010   Backache 04/21/2008   History of peptic ulcer disease 04/21/2008   Essential hypertension 10/29/2007  REFERRING PROVIDER: [name], MD  REFERRING DIAG:  M54.50,G89.29 (ICD-10-CM) - Chronic bilateral low back pain without sciatica  R26.89 (ICD-10-CM) - Other abnormalities of gait and mobility      THERAPY DIAG:  Pain in right hip   Pain in right leg   Difficulty in walking, not elsewhere classified   Muscle weakness (generalized)   ONSET DATE: October 10, 2021 recent fall causing fracture  SUBJECTIVE:                [place], MD surgeon at [place]: More issues since this recent flare up. Pain in groin as well as continued lateral pain. Leaving for [place], unsure when they will be back and then to [place] for babies.    PERTINENT HISTORY:  Stenosis, Rt THA 64 yr ago with h/o  periprosthetic fx & revision in 2022, h/o back surgery  Hard of hearing; can hear out of Left ear   PAIN:  Are you having pain: Yes 6310 Pain location:  Rt groin and lateral hip  Description: sharp/dull Aggravating factors: Rt WB, walking Relieving factors: rest   PRECAUTIONS: Posterior hip   WEIGHT BEARING RESTRICTIONS No   FALLS:  Has patient fallen in last 6 months? Yes, Number of falls: 1 4/21: no falls recently 6/9: denies new falls 7/7: denies falls 8/7: denies falls 9/15: 3 falls     PATIENT GOALS build up strength, get rid of assistance for toileting and getting in/out of bed, no AD in gait, stairs, Son's wedding in [place] first of June     OBJECTIVE: *All objective findings taken at [facility] unless otherwise noted.    PATIENT SURVEYS:  LEFS 39 4/21: 46 5/19: 43 6/9: 43 7/7: 43  0 on walking 2 blocks, 1 mile & stiars- would like to see these improve in particular 8/7: 42  Still 0/4 on prev stated items 9/15 unchanged from 8/7                  SENSATION:          Bil hand/feet neuropathy unchanged since evaluation      LE MMT:  6/9 measured with handheld dynamometry Normative values: hip flexion 38 lb, knee extension 79 lb   MMT Right 03/06/2022 Right/Left 04/19/22 Rt/Lt 05/17/22 Rt/Lt 06/07/22 Rt//Lt 07/05/22 Rt//Lt 07/26/22 Rt//Lt 9/15 Rt/Lt 10/27  Hip flexion 4/5 11.1/ 24.3 20.9/42.6 34.8/44.2 38.9//42.1  20.1//37.3 21.2//30.7  Knee flexion 3/5 32.3/37.9 37.7/55.3 38.7/48.6 42.2//54.9     Knee extension 3/5 12.1/42.4 20.7/60.7 25.8/50.5 24.9//50.6 28.2//66.2 22.9//43.5 35.2//55.8   (Blank rows = not tested)  6/23:unable to lift RtLE against gravity in sidelying  8/7: able to lift knee in clam independently but requires assistance for sidelying straight leg hip abduction       FUNCTIONAL TESTS:  5 times sit to stand: 38s 4/21: 18s without use of UEs 6/9:16s without  use of UEs 8/7: 14s with UEs, too much groin pain without UEs 9/15: 20s with UE for  last 4- balance felt off more than groin pain 10/27:13s without UEs Berg Balance Scale: 33/56 4/21: 40/50 6/9: 44/50    GAIT:   Comments: SPC in Lt hand, rt leg abducted and externally rotated, antalgic on Rt with pressure placed through cane 4/21: SPC in Lt hand, Rt leg no longer abd /ER; does demo Rt trunk SB in Rt stance phase 6/9: SPC in Lt hand, mild Rt trunk SB in Rt stance phase, maintains upright posture 7/7: with SPC: maint upright posture wihtout Rt sidebend in stance phase; without SPC- cont upright posture and good swing through but does present sidebend to the Rt in Rt stance phase 8/7: with SPC, upright mosture with minimal deviation from midline and minimal use of cane for assistance, antalgic without cane and flexes trunk rather than rotates.  9/15:notbale  maintenance of midline through trunk with ER and abd of LLE  10/27: slow cadence but maintains upright posture        TODAY'S TREATMENT   Treatment                            10/25/22:  Nu step 5 min L4 Gait with and without AD.     10/25: Pt seen for aquatic therapy today.  Treatment took place in water 3.25-4.75 ft in depth at the [facility]. Temp of water was 93.  Pt entered/exited the pool via stairs independently with step to pattern and bilat rail.               In water 4 ft deep unsupported:               * Warm up of forward, backward gait and side stepping   * SLS x 12 sec each LE  * forward walking lunges - RLE buckled (pain to 7/10)- completed in deeper water with greater ease, but painful  * SLS with 3 way LE kick holding yellow hand floats  * holding wall at 3 ft 6": R LE heel raises x 20  * at bench - STS with Rt foot back x 1 (pain up to 9/10); equal feet x 10   * hamstring stretch at stairs x 30 sec x 2  * calf stretch (runner's stretch)  * return to walking in deeper water for recovery   Treatment                            10/11/22:  Nu step L5 5 min Roller to Rt ITB,  quads & HS- provided small roller to use daily; passive hamstring stretch; STM to iliopsoas in slight hip abd SL hip abd 3*10 assisted by PT SL clams 3*10 unassisted  Bridge on heels, arms crossed over chest, 3s holds   10/11: Pt seen for aquatic therapy today.  Treatment took place in water 3.25-4.75 ft in depth at the [facility]. Temp of water was 93.  Pt entered/exited the pool via stairs independently with step to pattern and bilat rail.               In water 4 ft deep unsupported:               * Warm up of forward, backward gait and side stepping   * side stepping with  arm add with yellow hand buoys;  attempted forward walking lunges - pain at 1 rep-stopped  * Moved to 4+ ft of depth with improved pain level - returned to walking forward/ backward, forward marching with hand buoys under water at sides * with yellow hand buoys at surface:forward walking lunges ; SLS with forward/backward leg kicks and then hip abdct/add ; curtsy lunge x 2 - pain in R hip, stopped.  Rt heel raises (a little help from LLE) x 12  * blue step at 4 ft- L step down and Rt retro step up x 15 with LUE on wall   * at bench - STS with Rt foot back x 10; repeated with blue step under feet x 8 (challenging)  * Rt lateral step ups with heavy UE on rail x 5  * hamstring stretches at stairs, 20s x 2 each            Pt requires the buoyancy and hydrostatic pressure of water for support, and to offload joints by unweighting joint load by at least 50 % in navel deep water and by at least 75-80% in chest to neck deep water.  Viscosity of the water is needed for resistance of strengthening. Water current perturbations provides challenge to standing balance requiring increased core activation.      PATIENT EDUCATION:  Education details: aquatics modifications   Person educated: Patient and Spouse Education method: Explanation, Demonstration, Corporate treasurer cues, and Verbal cues Education comprehension:  verbalized understanding, returned demonstration, verbal cues required, tactile cues required, and needs further education     HOME EXERCISE PROGRAM: [id number]   ASSESSMENT:   CLINICAL IMPRESSION: Noted that pt has been working to improve LE alignment in gait to reduce ER, suspect that pain in gait is coming from fatigue that results in ER with stepping. Plan to put pt on hold as he will  be travelling for a few months and then do video visits & ERO as needed between travels. I would like to reach out to surgeon at [facility] to discuss this case in order to continue to progress strength and functional gains but reverse pain trends.   OBJECTIVE IMPAIRMENTS Abnormal gait, decreased activity tolerance, decreased balance, difficulty walking, decreased strength, impaired sensation, improper body mechanics, postural dysfunction, and pain.    ACTIVITY LIMITATIONS cleaning, community activity, driving, meal prep, laundry, and shopping.    PERSONAL FACTORS  see problem list  are also affecting patient's functional outcome.       GOALS: Goals reviewed with patient? Yes   SHORT TERM GOALS:   Able to enter/exit pool independently with use of hand rails Baseline: will begin training Target date: 04/03/2022 Goal status: Achieved    2.  Good tolerance to exercise without limitations in next day due to pain Baseline: Target date: 04/03/2022 Goal status: achieved    3.  Able to shift weight to perform step taps with UE assist Baseline: unable at eval Target date: 04/03/2022 Goal status: Achieved    4.  Able to ambulate with centered weight distribution Baseline: able with [facility] Target date: 04/03/2022 Goal status: achieved       LONG TERM GOALS:   BERG to improve by MDC of 6 points Baseline: 33 at eval Target date:  07/06/22 Goal status: achieved   2.  Pt will ambulate safely around his home without use of AD Baseline: achieved Goal status: ongoing   3.  Able to navigate stairs in home safely  and confidently Baseline:navigating stairs with "  up with the good, down with the bad" pattern  Goal status: ongoing   4.  LEFS to improve by MDC Baseline: MDC is 9 points  Goal status: INITIAL   5.  Pt will demo 5TSTS in 20s or less Baseline: 38s at eval  Goal status: Achieved - 04/19/22   6.  Pt will be able to rid home of aid devices  Goal status: achieved  7. Start walking around the neighborhood, ultimately without AD  Baseline: have not walked recently but last time I did it was without any AD but only made it about half way, full loop with cane     Goal status: partially met   PLAN: PT FREQUENCY: 1-2x/week   PT DURATION: other: 4 months   PLANNED INTERVENTIONS: Therapeutic exercises, Therapeutic activity, Neuromuscular re-education, Balance training, Gait training, Patient/Family education, Joint mobilization, Stair training, Aquatic Therapy, Electrical stimulation, Cryotherapy, Moist heat, Taping, and Manual therapy   PLAN FOR NEXT SESSION: ERO PRN  Tahja Liao C. Keighan Amezcua PT, DPT 10/25/22 12:25 PM  PHYSICAL THERAPY DISCHARGE SUMMARY  Visits from Start of Care: 49  Current functional level related to goals / functional outcomes: See above   Remaining deficits: See above   Education / Equipment: Anatomy of condition, POC, HEP, exercise form/rationale    Patient agrees to discharge. Patient goals were partially met. Patient is being discharged due to  traveling for extended period, now outside of hold time- will require new referral to return to [facility]. Brighton Delio PT, DPT 02/20/23 11:08 AM

## 2022-10-26 ENCOUNTER — Encounter: Payer: Self-pay | Admitting: Family Medicine

## 2022-10-26 DIAGNOSIS — I2782 Chronic pulmonary embolism: Secondary | ICD-10-CM

## 2022-10-28 ENCOUNTER — Encounter: Payer: Self-pay | Admitting: *Deleted

## 2022-10-28 ENCOUNTER — Telehealth: Payer: Self-pay | Admitting: *Deleted

## 2022-10-28 ENCOUNTER — Encounter: Payer: Self-pay | Admitting: Physician Assistant

## 2022-10-28 NOTE — Progress Notes (Unsigned)
Cardiology Office Note    Date:  10/30/2022   ID:  Bruce Ball, DOB 04/15/1946, MRN [id number]  PCP:  [name], MD  Cardiologist:  Fransico Him, MD  Electrophysiologist:  None   Chief Complaint: f/u dyspnea, testing  History of Present Illness:   Bruce Ball is a 76 y.o. male with history of HTN, PE 12/2019 with evidence of R heart strain on chronic anticoagulation with Eliquis, CKD stage 3a, coronary artery calcification, aortic atherosclerosis, ankylosing spondylitis, arthritis, essential tremor, GERD, kidney stones, peptic ulcer, thyroid nodule, chronic HFpEF, mild dilation of ascending aorta (normal by echo 08/2022) who is seen for follow-up.   He established care with [name] during admission 02/2021 with LE edema after 2400 mile road trip to the [place] and eating fast foods. CTA showed no recurrent PE but did have coronary calcification. Echo during that time showed hyperdynamic EF >75% with intracavitary gradient, G1DD, normal RV, normal PASP, mild aortic sclerosis without stenosis, mild dilation of ascending aorta. BP was elevated. He was treated with Lasix and diuresed 3.2L with -16lb but creatinine bumped up so meds were adjusted with plan for OP referral to nephrology. Calcium score 05/2021 was 152 (43%ile) with extensive aortic arch and descending aorta atherosclerosis and aortic valve leaflet calcifications, continued on statin therapy. Per patient report he is now maintained on long term anticoagulation. This is not managed through our office. He was recently seen in the office 08/2022 at the request of primary care for dyspnea. He had been seen by PCP for symptoms of persistent dizziness, DOE, worse memory, weight loss, malaise, chest congestion and persistent cough. Symptoms were felt post-Covid in nature as he had been diagnosed with Covid 08/26/22 and hospitalized for 3-4 days after recurrent falls in [place] (records not available), treated with antiviral. BNP was normal.  He was subsequently treated with doxycycline.  2D echo 08/2022 was reassuring with EF 65-70%, normal RV, mild LAE, normal size aorta. Nuclear stress test 09/2022 was normal and symptoms felt noncardiac.  He is seen for follow-up today overall feeling better than he was a month ago. His dyspnea has improved. He graduated from PT and is participating in a swimming class. No recent angina. BP slightly elevated upon arrival which wife wonders may be related to some hip pain he's been having. Recheck by me improved to 132/70.    Labwork independently reviewed: 09/2022 CBC wnl, K 4.1, Cr 1.49 08/2022 BNP wnl, B12 OK, K 4.2, Cr 1.50, CBC stable 11/2021 trops neg, d-dimer 7, BNP wnl, Hgb 12.2, plt 322, K 3.5, Cr 1.35, albumin 3.2, AST/ALT OK 09/2021 TSH wnl, Mg ok 06/2021 LDL 50, trig 84  Cardiology Studies:   Studies reviewed are outlined and summarized above. Reports included below if pertinent.   NST 09/2022 The study is normal. The study is low risk.   No ST deviation was noted.   Left ventricular function is normal. Nuclear stress EF: 60 %. The left ventricular ejection fraction is normal (55-65%). End diastolic cavity size is normal. End systolic cavity size is normal.   Prior study not available for comparison.   Normal resting and stress perfusion. No ischemia or infarction EF 60%  Echo 08/2022    1. Left ventricular ejection fraction, by estimation, is 65 to 70%. The  left ventricle has normal function. The left ventricle has no regional  wall motion abnormalities. Left ventricular diastolic parameters are  indeterminate.   2. Right ventricular systolic function is normal. The right  ventricular  size is normal.   3. Left atrial size was mildly dilated.   4. The mitral valve is normal in structure. Trivial mitral valve  regurgitation. No evidence of mitral stenosis.   5. The aortic valve is tricuspid. There is mild calcification of the  aortic valve. There is mild thickening of the  aortic valve. Aortic valve  regurgitation is trivial. Aortic valve sclerosis is present, with no  evidence of aortic valve stenosis.   Comparison(s): Changes from prior study are noted. Prior ascending aorta  measured 3.9 cm, measures 3.5 cm on current study.   Conclusion(s)/Recommendation(s): Otherwise normal echocardiogram, with  minor abnormalities described in the report.     Past Medical History:  Diagnosis Date   Ankylosing spondylitis ([place])    Arthritis of right knee    Basal cell carcinoma    Chronic back pain greater than 3 months duration    Chronic leg pain    Coronary artery calcification seen on CAT scan 03/27/2021   Deafness in right ear    Dyspnea    cause by PE   Essential tremor    Extrinsic asthma, unspecified 04/07/2009   PT DENIES   GERD (gastroesophageal reflux disease)    Hearing loss    Hip problem    History of kidney stones    HYPERTENSION 10/29/2007   HYPOTENSION 08/13/2010   LOW BACK PAIN 04/21/2008   NEPHROLITHIASIS, HX OF 04/21/2008   Neuropathy    Osteopenia    Other hyperlipidemia    Peptic ulcer    Pneumonia    PUD, HX OF 04/21/2008   Pulmonary emboli ([place]) 2021   noted on CT 12-2019   Stage 3 chronic kidney disease ([place])    Thyroid nodule     Past Surgical History:  Procedure Laterality Date   BACK SURGERY     bone cancer  1970   rt femur removed and steel rod placed   CYSTOSCOPY WITH RETROGRADE PYELOGRAM, URETEROSCOPY AND STENT PLACEMENT Right 11/06/2017   Procedure: CYSTOSCOPY WITH  STENT PLACEMENT RIGHT;  Surgeon: Raynelle Bring, MD;  Location: [facility];  Service: Urology;  Laterality: Right;   CYSTOSCOPY WITH RETROGRADE PYELOGRAM, URETEROSCOPY AND STENT PLACEMENT Left 06/09/2019   Procedure: CYSTOSCOPY WITH RETROGRADE PYELOGRAM, URETEROSCOPY AND STENT PLACEMENT X2;  Surgeon: [name], MD;  Location: [facility];  Service: Urology;  Laterality: Left;   CYSTOSCOPY WITH RETROGRADE PYELOGRAM, URETEROSCOPY AND STENT PLACEMENT  Bilateral 03/01/2020   Procedure: CYSTOSCOPY WITH RETROGRADE PYELOGRAM, URETEROSCOPY AND STENT PLACEMENT;  Surgeon: [name], MD;  Location: [facility];  Service: Urology;  Laterality: Bilateral;  75 MINS   CYSTOSCOPY/RETROGRADE/URETEROSCOPY     1 year ago at [place] in [place] Right 11/24/2017   Procedure: CYSTOSCOPY/URETEROSCOPY/ RETROGRADE/STENT REMOVAL;  Surgeon: Raynelle Bring, MD;  Location: [facility];  Service: Urology;  Laterality: Right;   HOLMIUM LASER APPLICATION Bilateral 0/08/3817   Procedure: HOLMIUM LASER APPLICATION;  Surgeon: [name], MD;  Location: [facility];  Service: Urology;  Laterality: Bilateral;   prosatectomy      Current Medications: Current Meds  Medication Sig   acetaminophen (TYLENOL) 500 MG tablet Take by mouth.   apixaban (ELIQUIS) 2.5 MG TABS tablet Take 1 tablet (2.5 mg total) by mouth 2 (two) times daily.   atorvastatin (LIPITOR) 20 MG tablet Take 1 tablet (20 mg total) by mouth at bedtime.   carvedilol (COREG) 25 MG tablet Take 12.5 mg by mouth 2 (two) times daily.   Cholecalciferol (VITAMIN D3)  50 MCG (2000 UT) capsule Take 2,000 Units by mouth in the morning and at bedtime.    cyclobenzaprine (FLEXERIL) 10 MG tablet TAKE ONE-HALF TABLET BY MOUTH AT BEDTIME FOR MUSCLE PAIN * CAUSES HANGOVER, DO NOT DRIVE   ferrous sulfate 325 (65 FE) MG tablet Take 325 mg by mouth every Monday, Wednesday, and Friday.   fluticasone (FLONASE) 50 MCG/ACT nasal spray Place 1-2 sprays into both nostrils daily as needed for allergies or rhinitis.   gabapentin (NEURONTIN) 300 MG capsule Take 300 mg by mouth at bedtime.   pantoprazole (PROTONIX) 40 MG tablet Take 40 mg by mouth 2 (two) times daily before a meal.      Allergies:   Erythromycin and Lisinopril   Social History   Socioeconomic History   Marital status: Married    Spouse name: Not on file   Number of children: 2   Years of education: Not on file   Highest education  level: Not on file  Occupational History   Occupation: Retired Optometrist  Tobacco Use   Smoking status: Former    Packs/day: 1.00    Years: 17.00    Total pack years: 17.00    Types: Cigarettes    Start date: 1963    Quit date: 12/30/1978    Years since quitting: 43.8   Smokeless tobacco: Never  Vaping Use   Vaping Use: Never used  Substance and Sexual Activity   Alcohol use: No   Drug use: No   Sexual activity: Not on file  Other Topics Concern   Not on file  Social History Narrative   ** Merged History Encounter **       Social Determinants of Health   Financial Resource Strain: Low Risk  (01/25/2022)   Overall Financial Resource Strain (CARDIA)    Difficulty of Paying Living Expenses: Not hard at all  Food Insecurity: No Food Insecurity (10/28/2022)   Hunger Vital Sign    Worried About Running Out of Food in the Last Year: Never true    [place] in the Last Year: Never true  Transportation Needs: No Transportation Needs (10/28/2022)   PRAPARE - Hydrologist (Medical): No    Lack of Transportation (Non-Medical): No  Physical Activity: Insufficiently Active (01/25/2022)   Exercise Vital Sign    Days of Exercise per Week: 2 days    Minutes of Exercise per Session: 50 min  Stress: No Stress Concern Present (01/25/2022)   [place]    Feeling of Stress : Not at all  Social Connections: Moderately Isolated (01/25/2022)   Social Connection and Isolation Panel [NHANES]    Frequency of Communication with Friends and Family: More than three times a week    Frequency of Social Gatherings with Friends and Family: More than three times a week    Attends Religious Services: Never    Marine scientist or Organizations: No    Attends Music therapist: Never    Marital Status: Married     Family History:  The patient's family history includes COPD in his father;  Depression in his brother; Hypertension in his father; Prostate cancer in his brother; Stomach cancer in his brother; Stroke in his brother. There is no history of Esophageal cancer, Pancreatic cancer, or Colon cancer.  ROS:   Please see the history of present illness.  All other systems are reviewed and otherwise negative.    EKG(s)/Additional  Labs   EKG:  EKG is not ordered today as done at recent [place]: 12/23/2021: B Natriuretic Peptide 30.1 09/16/2022: ALT 12; Pro B Natriuretic peptide (BNP) 81.0 10/18/2022: BUN 17; Creatinine, Ser 1.49; Hemoglobin 15.0; Platelets 216; Potassium 4.1; Sodium 140  Recent Lipid Panel    Component Value Date/Time   CHOL 101 07/11/2021 0000   CHOL 103 06/05/2021 0935   TRIG 84 07/11/2021 0000   HDL 34 (A) 07/11/2021 0000   HDL 38 (L) 06/05/2021 0935   CHOLHDL 3 04/03/2021 1637   VLDL 19.2 04/03/2021 1637   LDLCALC 50 07/11/2021 0000   LDLCALC 52 06/05/2021 0935   LDLDIRECT 145.4 06/27/2011 1124    PHYSICAL EXAM:    VS:  BP (!) 146/78   Pulse (!) 59   Ht '5\' 11"'$  (1.803 m)   Wt 238 lb 9.6 oz (108.2 kg)   SpO2 97%   BMI 33.28 kg/m   BMI: Body mass index is 33.28 kg/m.  GEN: Well nourished, well developed male in no acute distress HEENT: normocephalic, atraumatic Neck: no JVD, carotid bruits, or masses Cardiac: RRR; no murmurs, rubs, or gallops, no edema  Respiratory:  clear to auscultation bilaterally, normal work of breathing GI: soft, nontender, nondistended, + BS MS: no deformity or atrophy Skin: warm and dry, no rash Neuro:  Alert and Oriented x 3, Strength and sensation are intact, follows commands Psych: euthymic mood, full affect  Wt Readings from Last 3 Encounters:  10/30/22 238 lb 9.6 oz (108.2 kg)  10/01/22 235 lb (106.6 kg)  09/17/22 235 lb (106.6 kg)     ASSESSMENT & PLAN:   1. Dyspnea on exertion - suspected to be related to recent Covid infection, may have component of deconditioning as well. Recent  cardiac workup reassuring and symptoms improving. If there is any interval worsening again, consideration could be given to pulmonology referral. Saw Dr. Chase Caller back in 2015 for lung nodule not seen on imaging last in 11/2021.  2. Coronary calcification - he is not on ASA due to concomitant apixaban for his h/o PE. His apixaban is not managed by our team; defer to PCP/heme-onc for long term duration. Wife reports he'd seen Dr. Alvy Bimler for this. His wife was able to pull up LDL on [place] records and it was 63 - she is not sure of the date but it was obtained within the last year and they follow it regularly.  3. Essential HTN - BP suboptimally controlled today suspect driven by aggravation of chronic hip problems, improved with sitting. The patient was instructed to monitor their blood pressure at home and to call if tending to run higher than 846 systolic regularly. Prior BP here in the office was normal. He reports this value today was unusual for him.  4. Chronic HFpEF - volume status appears normal on exam. No present indication for standing diuretic therapy. Echo stable. Of note, aortic dimension was normal.  5. Preop evaluation - recent cardiac testing was stable as above, and dyspnea has been improving. Therefore, based on ACC/AHA guidelines, Bruce Ball would be at acceptable risk for the planned procedure from cardiac standpoint without further cardiovascular testing. As outlined above, he is not on apixaban for cardiac reasons so if surgeon needs further instructions on holding this medicine, would recommend they reach out to his heme-onc/primary care team for advisement. Will cc copy of note to Dr. Wendie Chess.    Disposition: F/u with Dr. Radford Pax in 6 months.   Medication  Adjustments/Labs and Tests Ordered: Current medicines are reviewed at length with the patient today.  Concerns regarding medicines are outlined above. Medication changes, Labs and Tests ordered today are summarized above and  listed in the Patient Instructions accessible in Encounters.   Signed, Charlie Pitter, PA-C  10/30/2022 11:39 AM    [place] Phone: [phone]; Fax: [phone]

## 2022-10-28 NOTE — Patient Outreach (Signed)
  Care Coordination   Initial Visit Note   10/28/2022 Name: Bruce Ball MRN: [id number] DOB: 05/13/1946  Bruce Ball is a 76 y.o. year old male who sees Yong Channel, Brayton Mars, MD for primary care. I spoke with  [name] by phone today.  What matters to the patients health and wellness today?  No needs    Goals Addressed               This Visit's Progress     No needs (pt-stated)        Care Coordination Interventions: Reviewed medications with patient and discussed adherence to all medications with no needed refills Reviewed scheduled/upcoming provider appointments including pending appointments Screening for signs and symptoms of depression related to chronic disease state  Assessed social determinant of health barriers         SDOH assessments and interventions completed:  Yes  SDOH Interventions Today    Flowsheet Row Most Recent Value  SDOH Interventions   Food Insecurity Interventions Intervention Not Indicated  Housing Interventions Intervention Not Indicated  Transportation Interventions Intervention Not Indicated  Utilities Interventions Intervention Not Indicated        Care Coordination Interventions Activated:  Yes  Care Coordination Interventions:  Yes, provided   Follow up plan: No further intervention required.   Encounter Outcome:  Pt. Visit Completed   Raina Mina, RN Care Management Coordinator [place] Office [phone]

## 2022-10-28 NOTE — Patient Instructions (Signed)
Visit Information  Thank you for taking time to visit with me today. Please don't hesitate to contact me if I can be of assistance to you.   Following are the goals we discussed today:   Goals Addressed               This Visit's Progress     No needs (pt-stated)        Care Coordination Interventions: Reviewed medications with patient and discussed adherence to all medications with no needed refills Reviewed scheduled/upcoming provider appointments including pending appointments Screening for signs and symptoms of depression related to chronic disease state  Assessed social determinant of health barriers         Please call the care guide team at [phone] if you need to cancel or reschedule your appointment.   If you are experiencing a Mental Health or [place] or need someone to talk to, please call the Suicide and Crisis Lifeline: 988  Patient verbalizes understanding of instructions and care plan provided today and agrees to view in [place]. Active MyChart status and patient understanding of how to access instructions and care plan via MyChart confirmed with patient.     No further follow up required: No needs  Raina Mina, RN Care Management Coordinator [place] Office [phone]

## 2022-10-30 ENCOUNTER — Encounter: Payer: Self-pay | Admitting: Physician Assistant

## 2022-10-30 ENCOUNTER — Ambulatory Visit: Payer: Medicare Other | Attending: Family Medicine | Admitting: Physician Assistant

## 2022-10-30 VITALS — BP 132/70 | HR 59 | Ht 71.0 in | Wt 238.6 lb

## 2022-10-30 DIAGNOSIS — I2510 Atherosclerotic heart disease of native coronary artery without angina pectoris: Secondary | ICD-10-CM | POA: Diagnosis not present

## 2022-10-30 DIAGNOSIS — I2584 Coronary atherosclerosis due to calcified coronary lesion: Secondary | ICD-10-CM | POA: Diagnosis not present

## 2022-10-30 DIAGNOSIS — R0609 Other forms of dyspnea: Secondary | ICD-10-CM | POA: Insufficient documentation

## 2022-10-30 DIAGNOSIS — I10 Essential (primary) hypertension: Secondary | ICD-10-CM | POA: Diagnosis not present

## 2022-10-30 DIAGNOSIS — I5032 Chronic diastolic (congestive) heart failure: Secondary | ICD-10-CM | POA: Diagnosis not present

## 2022-10-30 DIAGNOSIS — Z01810 Encounter for preprocedural cardiovascular examination: Secondary | ICD-10-CM | POA: Diagnosis not present

## 2022-10-30 NOTE — Patient Instructions (Addendum)
Medication Instructions:  Your physician recommends that you continue on your current medications as directed. Please refer to the Current Medication list given to you today. *If you need a refill on your cardiac medications before your next appointment, please call your pharmacy*   Lab Work: None Ordered   Testing/Procedures: None Ordered   Follow-Up: At [facility], you and your health needs are our priority.  As part of our continuing mission to provide you with exceptional heart care, we have created designated Provider Care Teams.  These Care Teams include your primary Cardiologist (physician) and Advanced Practice Providers (APPs -  Physician Assistants and Nurse Practitioners) who all work together to provide you with the care you need, when you need it.  We recommend signing up for the patient portal called "MyChart".  Sign up information is provided on this After Visit Summary.  MyChart is used to connect with patients for Virtual Visits (Telemedicine).  Patients are able to view lab/test results, encounter notes, upcoming appointments, etc.  Non-urgent messages can be sent to your provider as well.   To learn more about what you can do with MyChart, go to [email or web address].    Your next appointment:   6 month(s)  The format for your next appointment:   In Person  Provider:   Fransico Him, MD     Other Instructions   Important Information About Sugar         Please double check to see if the [place] is following your cholesterol. We are most interested in making sure your LDL level is controlled - we would suggest a goal of less than 70 for the LDL.

## 2022-11-05 ENCOUNTER — Other Ambulatory Visit: Payer: Medicare Other

## 2022-11-05 DIAGNOSIS — I2699 Other pulmonary embolism without acute cor pulmonale: Secondary | ICD-10-CM | POA: Diagnosis not present

## 2022-11-05 DIAGNOSIS — I2782 Chronic pulmonary embolism: Secondary | ICD-10-CM | POA: Diagnosis not present

## 2022-11-08 LAB — ANTITHROMBIN III: AntiThromb III Func: 96 % normal (ref 80–135)

## 2022-11-11 ENCOUNTER — Inpatient Hospital Stay (HOSPITAL_BASED_OUTPATIENT_CLINIC_OR_DEPARTMENT_OTHER)
Admission: EM | Admit: 2022-11-11 | Discharge: 2022-11-13 | DRG: 689 | Disposition: A | Discharge status: Home Health | Payer: Medicare Other | Attending: Internal Medicine | Admitting: Internal Medicine

## 2022-11-11 ENCOUNTER — Emergency Department (HOSPITAL_BASED_OUTPATIENT_CLINIC_OR_DEPARTMENT_OTHER): Payer: Medicare Other

## 2022-11-11 ENCOUNTER — Encounter (HOSPITAL_COMMUNITY): Payer: Self-pay

## 2022-11-11 ENCOUNTER — Encounter (HOSPITAL_BASED_OUTPATIENT_CLINIC_OR_DEPARTMENT_OTHER): Payer: Self-pay | Admitting: Emergency Medicine

## 2022-11-11 DIAGNOSIS — I10 Essential (primary) hypertension: Secondary | ICD-10-CM | POA: Diagnosis present

## 2022-11-11 DIAGNOSIS — R102 Pelvic and perineal pain: Secondary | ICD-10-CM | POA: Diagnosis not present

## 2022-11-11 DIAGNOSIS — Z800 Family history of malignant neoplasm of digestive organs: Secondary | ICD-10-CM

## 2022-11-11 DIAGNOSIS — Z1611 Resistance to penicillins: Secondary | ICD-10-CM | POA: Diagnosis present

## 2022-11-11 DIAGNOSIS — I5032 Chronic diastolic (congestive) heart failure: Secondary | ICD-10-CM | POA: Diagnosis not present

## 2022-11-11 DIAGNOSIS — B961 Klebsiella pneumoniae [K. pneumoniae] as the cause of diseases classified elsewhere: Secondary | ICD-10-CM | POA: Diagnosis present

## 2022-11-11 DIAGNOSIS — M791 Myalgia, unspecified site: Secondary | ICD-10-CM

## 2022-11-11 DIAGNOSIS — R1030 Lower abdominal pain, unspecified: Secondary | ICD-10-CM | POA: Diagnosis not present

## 2022-11-11 DIAGNOSIS — Z8042 Family history of malignant neoplasm of prostate: Secondary | ICD-10-CM | POA: Diagnosis not present

## 2022-11-11 DIAGNOSIS — N1831 Chronic kidney disease, stage 3a: Secondary | ICD-10-CM | POA: Diagnosis present

## 2022-11-11 DIAGNOSIS — H9191 Unspecified hearing loss, right ear: Secondary | ICD-10-CM | POA: Diagnosis present

## 2022-11-11 DIAGNOSIS — N3000 Acute cystitis without hematuria: Principal | ICD-10-CM

## 2022-11-11 DIAGNOSIS — N183 Chronic kidney disease, stage 3 unspecified: Secondary | ICD-10-CM | POA: Diagnosis present

## 2022-11-11 DIAGNOSIS — E860 Dehydration: Secondary | ICD-10-CM | POA: Diagnosis present

## 2022-11-11 DIAGNOSIS — G250 Essential tremor: Secondary | ICD-10-CM | POA: Diagnosis present

## 2022-11-11 DIAGNOSIS — K279 Peptic ulcer, site unspecified, unspecified as acute or chronic, without hemorrhage or perforation: Secondary | ICD-10-CM | POA: Diagnosis not present

## 2022-11-11 DIAGNOSIS — E785 Hyperlipidemia, unspecified: Secondary | ICD-10-CM | POA: Diagnosis not present

## 2022-11-11 DIAGNOSIS — I130 Hypertensive heart and chronic kidney disease with heart failure and stage 1 through stage 4 chronic kidney disease, or unspecified chronic kidney disease: Secondary | ICD-10-CM | POA: Diagnosis not present

## 2022-11-11 DIAGNOSIS — G4489 Other headache syndrome: Secondary | ICD-10-CM | POA: Diagnosis not present

## 2022-11-11 DIAGNOSIS — G9341 Metabolic encephalopathy: Secondary | ICD-10-CM | POA: Diagnosis not present

## 2022-11-11 DIAGNOSIS — Z85828 Personal history of other malignant neoplasm of skin: Secondary | ICD-10-CM

## 2022-11-11 DIAGNOSIS — I2510 Atherosclerotic heart disease of native coronary artery without angina pectoris: Secondary | ICD-10-CM | POA: Diagnosis not present

## 2022-11-11 DIAGNOSIS — R748 Abnormal levels of other serum enzymes: Secondary | ICD-10-CM

## 2022-11-11 DIAGNOSIS — Z823 Family history of stroke: Secondary | ICD-10-CM | POA: Diagnosis not present

## 2022-11-11 DIAGNOSIS — G609 Hereditary and idiopathic neuropathy, unspecified: Secondary | ICD-10-CM | POA: Diagnosis present

## 2022-11-11 DIAGNOSIS — Z8249 Family history of ischemic heart disease and other diseases of the circulatory system: Secondary | ICD-10-CM | POA: Diagnosis not present

## 2022-11-11 DIAGNOSIS — J349 Unspecified disorder of nose and nasal sinuses: Secondary | ICD-10-CM | POA: Diagnosis present

## 2022-11-11 DIAGNOSIS — Z818 Family history of other mental and behavioral disorders: Secondary | ICD-10-CM

## 2022-11-11 DIAGNOSIS — Z825 Family history of asthma and other chronic lower respiratory diseases: Secondary | ICD-10-CM

## 2022-11-11 DIAGNOSIS — K219 Gastro-esophageal reflux disease without esophagitis: Secondary | ICD-10-CM | POA: Diagnosis present

## 2022-11-11 DIAGNOSIS — Z86711 Personal history of pulmonary embolism: Secondary | ICD-10-CM | POA: Diagnosis not present

## 2022-11-11 DIAGNOSIS — R4182 Altered mental status, unspecified: Secondary | ICD-10-CM | POA: Diagnosis not present

## 2022-11-11 DIAGNOSIS — Z20822 Contact with and (suspected) exposure to covid-19: Secondary | ICD-10-CM | POA: Diagnosis present

## 2022-11-11 DIAGNOSIS — N390 Urinary tract infection, site not specified: Secondary | ICD-10-CM | POA: Diagnosis not present

## 2022-11-11 DIAGNOSIS — Z79899 Other long term (current) drug therapy: Secondary | ICD-10-CM

## 2022-11-11 DIAGNOSIS — R32 Unspecified urinary incontinence: Secondary | ICD-10-CM | POA: Diagnosis present

## 2022-11-11 DIAGNOSIS — Z7901 Long term (current) use of anticoagulants: Secondary | ICD-10-CM

## 2022-11-11 DIAGNOSIS — B9689 Other specified bacterial agents as the cause of diseases classified elsewhere: Secondary | ICD-10-CM

## 2022-11-11 DIAGNOSIS — R531 Weakness: Secondary | ICD-10-CM | POA: Diagnosis not present

## 2022-11-11 DIAGNOSIS — T796XXA Traumatic ischemia of muscle, initial encounter: Secondary | ICD-10-CM | POA: Diagnosis present

## 2022-11-11 DIAGNOSIS — I5030 Unspecified diastolic (congestive) heart failure: Secondary | ICD-10-CM | POA: Diagnosis present

## 2022-11-11 DIAGNOSIS — Z87891 Personal history of nicotine dependence: Secondary | ICD-10-CM

## 2022-11-11 DIAGNOSIS — E7849 Other hyperlipidemia: Secondary | ICD-10-CM | POA: Diagnosis present

## 2022-11-11 DIAGNOSIS — Z8546 Personal history of malignant neoplasm of prostate: Secondary | ICD-10-CM | POA: Diagnosis not present

## 2022-11-11 DIAGNOSIS — T1490XA Injury, unspecified, initial encounter: Secondary | ICD-10-CM | POA: Diagnosis not present

## 2022-11-11 LAB — URINALYSIS, ROUTINE W REFLEX MICROSCOPIC
Bilirubin Urine: NEGATIVE
Glucose, UA: NEGATIVE mg/dL
Ketones, ur: NEGATIVE mg/dL
Nitrite: POSITIVE — AB
RBC / HPF: >50 RBC/hpf — ABNORMAL HIGH (ref 0–5)
Specific Gravity, Urine: 1.014 (ref 1.005–1.030)
WBC, UA: >50 WBC/hpf — ABNORMAL HIGH (ref 0–5)
pH: 6 (ref 5.0–8.0)

## 2022-11-11 LAB — CBC WITH DIFFERENTIAL/PLATELET
Abs Immature Granulocytes: 0.04 10*3/uL (ref 0.00–0.07)
Basophils Absolute: 0.1 10*3/uL (ref 0.0–0.1)
Basophils Relative: 0 %
Eosinophils Absolute: 0.3 10*3/uL (ref 0.0–0.5)
Eosinophils Relative: 2 %
HCT: 45.3 % (ref 39.0–52.0)
Hemoglobin: 14.8 g/dL (ref 13.0–17.0)
Immature Granulocytes: 0 %
Lymphocytes Relative: 9 %
Lymphs Abs: 1 10*3/uL (ref 0.7–4.0)
MCH: 28.5 pg (ref 26.0–34.0)
MCHC: 32.7 g/dL (ref 30.0–36.0)
MCV: 87.3 fL (ref 80.0–100.0)
Monocytes Absolute: 1.2 10*3/uL — ABNORMAL HIGH (ref 0.1–1.0)
Monocytes Relative: 10 %
Neutro Abs: 8.7 10*3/uL — ABNORMAL HIGH (ref 1.7–7.7)
Neutrophils Relative %: 79 %
Platelets: 181 10*3/uL (ref 150–400)
RBC: 5.19 MIL/uL (ref 4.22–5.81)
RDW: 13.5 % (ref 11.5–15.5)
WBC: 11.3 10*3/uL — ABNORMAL HIGH (ref 4.0–10.5)
nRBC: 0 % (ref 0.0–0.2)

## 2022-11-11 LAB — COMPREHENSIVE METABOLIC PANEL
ALT: 11 U/L (ref 0–44)
AST: 14 U/L — ABNORMAL LOW (ref 15–41)
Albumin: 3.6 g/dL (ref 3.5–5.0)
Alkaline Phosphatase: 71 U/L (ref 38–126)
Anion gap: 11 (ref 5–15)
BUN: 21 mg/dL (ref 8–23)
CO2: 24 mmol/L (ref 22–32)
Calcium: 8.8 mg/dL — ABNORMAL LOW (ref 8.9–10.3)
Chloride: 105 mmol/L (ref 98–111)
Creatinine, Ser: 1.55 mg/dL — ABNORMAL HIGH (ref 0.61–1.24)
GFR, Estimated: 46 mL/min — ABNORMAL LOW (ref >60)
Glucose, Bld: 112 mg/dL — ABNORMAL HIGH (ref 70–99)
Potassium: 3.9 mmol/L (ref 3.5–5.1)
Sodium: 140 mmol/L (ref 135–145)
Total Bilirubin: 1.3 mg/dL — ABNORMAL HIGH (ref 0.3–1.2)
Total Protein: 6.4 g/dL — ABNORMAL LOW (ref 6.5–8.1)

## 2022-11-11 LAB — RESP PANEL BY RT-PCR (FLU A&B, COVID) ARPGX2
Influenza A by PCR: NEGATIVE
Influenza B by PCR: NEGATIVE
SARS Coronavirus 2 by RT PCR: NEGATIVE

## 2022-11-11 LAB — LIPASE, BLOOD: Lipase: 29 U/L (ref 11–51)

## 2022-11-11 LAB — TROPONIN I (HIGH SENSITIVITY): Troponin I (High Sensitivity): 8 ng/L (ref <18)

## 2022-11-11 LAB — TSH: TSH: 0.761 u[IU]/mL (ref 0.350–4.500)

## 2022-11-11 LAB — CK: Total CK: 738 U/L — ABNORMAL HIGH (ref 49–397)

## 2022-11-11 LAB — AMMONIA: Ammonia: 27 umol/L (ref 9–35)

## 2022-11-11 MED ORDER — ONDANSETRON HCL 4 MG PO TABS
4.0000 mg | ORAL_TABLET | Freq: Four times a day (QID) | ORAL | Status: DC | PRN
  Administered 2022-11-12: 4 mg via ORAL
  Filled 2022-11-11: qty 1

## 2022-11-11 MED ORDER — ENOXAPARIN SODIUM 40 MG/0.4ML IJ SOSY
40.0000 mg | PREFILLED_SYRINGE | Freq: Every day | INTRAMUSCULAR | Status: DC
  Administered 2022-11-11 – 2022-11-13 (×3): 40 mg via SUBCUTANEOUS
  Filled 2022-11-11 (×3): qty 0.4

## 2022-11-11 MED ORDER — POLYETHYLENE GLYCOL 3350 17 G PO PACK: 17.0000 g | PACK | Freq: Every day | ORAL | Status: DC | PRN

## 2022-11-11 MED ORDER — SODIUM CHLORIDE 0.9 % IV SOLN
1.0000 g | Freq: Once | INTRAVENOUS | Status: AC
  Administered 2022-11-11: 1 g via INTRAVENOUS
  Filled 2022-11-11: qty 10

## 2022-11-11 MED ORDER — MORPHINE SULFATE (PF) 2 MG/ML IV SOLN: 2.0000 mg | INTRAVENOUS | Status: DC | PRN

## 2022-11-11 MED ORDER — ACETAMINOPHEN 325 MG PO TABS
650.0000 mg | ORAL_TABLET | Freq: Four times a day (QID) | ORAL | Status: DC | PRN
  Administered 2022-11-11 (×2): 650 mg via ORAL
  Filled 2022-11-11 (×2): qty 2

## 2022-11-11 MED ORDER — SODIUM CHLORIDE 0.9 % IV BOLUS
500.0000 mL | Freq: Once | INTRAVENOUS | Status: AC
  Administered 2022-11-11: 500 mL via INTRAVENOUS

## 2022-11-11 MED ORDER — ONDANSETRON HCL 4 MG/2ML IJ SOLN: 4.0000 mg | Freq: Four times a day (QID) | INTRAMUSCULAR | Status: DC | PRN

## 2022-11-11 MED ORDER — CARVEDILOL 12.5 MG PO TABS: 12.5000 mg | ORAL_TABLET | Freq: Two times a day (BID) | ORAL | Status: DC

## 2022-11-11 MED ORDER — BISACODYL 5 MG PO TBEC: 5.0000 mg | DELAYED_RELEASE_TABLET | Freq: Every day | ORAL | Status: DC | PRN

## 2022-11-11 MED ORDER — HYDRALAZINE HCL 20 MG/ML IJ SOLN: 5.0000 mg | INTRAMUSCULAR | Status: DC | PRN

## 2022-11-11 MED ORDER — GABAPENTIN 300 MG PO CAPS
300.0000 mg | ORAL_CAPSULE | Freq: Every day | ORAL | Status: DC
  Administered 2022-11-12: 300 mg via ORAL
  Filled 2022-11-11: qty 1

## 2022-11-11 MED ORDER — ATORVASTATIN CALCIUM 10 MG PO TABS
20.0000 mg | ORAL_TABLET | Freq: Every day | ORAL | Status: DC
  Administered 2022-11-12: 20 mg via ORAL
  Filled 2022-11-11: qty 2

## 2022-11-11 MED ORDER — SODIUM CHLORIDE 0.9 % IV SOLN
1.0000 g | INTRAVENOUS | Status: DC
  Administered 2022-11-12: 1 g via INTRAVENOUS
  Filled 2022-11-11: qty 10

## 2022-11-11 MED ORDER — APIXABAN 2.5 MG PO TABS
2.5000 mg | ORAL_TABLET | Freq: Two times a day (BID) | ORAL | Status: DC
  Filled 2022-11-11: qty 1

## 2022-11-11 MED ORDER — ACETAMINOPHEN 650 MG RE SUPP: 650.0000 mg | Freq: Four times a day (QID) | RECTAL | Status: DC | PRN

## 2022-11-11 MED ORDER — LACTATED RINGERS IV SOLN: INTRAVENOUS | Status: DC

## 2022-11-11 MED ORDER — DOCUSATE SODIUM 100 MG PO CAPS
100.0000 mg | ORAL_CAPSULE | Freq: Two times a day (BID) | ORAL | Status: DC
  Administered 2022-11-11 – 2022-11-12 (×4): 100 mg via ORAL
  Filled 2022-11-11 (×5): qty 1

## 2022-11-11 MED ORDER — OXYCODONE HCL 5 MG PO TABS: 5.0000 mg | ORAL_TABLET | ORAL | Status: DC | PRN

## 2022-11-11 NOTE — ED Provider Notes (Signed)
7:43 AM Care assumed from Dr. Pearline Cables.  At time of transfer of care, patient is awaiting results of urinalysis and reassessment.  Per documentation, family was refusing head CT as patient not hit his head although the patient is reportedly acting altered from baseline.  Previous team felt that if the family did not want head CT or further head imaging patient may need to leave AMA depending on the work-up results.  8:40 AM I reassessed the patient and he is now complaining of pain in his head and neck.  I asked to get the CT imaging of the previous team wanted to get of the head and he now agrees.  We will add on a CT of the cervical spine given the pain he is having in his neck.  We will get head CT and neck CT and due to the altered mental status, we will add on some other labs including an ammonia, TSH, and he is complaining of soreness in the large muscles of his legs we will add on a CK and magnesium as well.  Anticipate reassessment after work-up to determine disposition.  Patient's CK returned elevated, will order some fluids.  Urinalysis showed significant evidence of urinary tract infection.  I called the family and she agrees that he was acting slightly confused and was incontinent to urine.  She thinks he does have a UTI that could be causing his altered mental status.  Due to his poor kidney function and the early rhabdo I do feel patient is admission for both antibiotics to help the UTI, monitoring his mental status, monitoring kidney function, and hydration for the elevated CK.  Family agrees.  Patient is not having further headache or neck pain and he did not appear meningitic on my exam.  Low suspicion for meningitis and do not feel he needs LP at this time as there is clearly a source of UTI.  I spoke with Dr. Lorin Mercy who agrees with admission.  Patient admitted for further management.  Clinical Impression: 1. Acute cystitis without hematuria   2. Altered mental status, unspecified  altered mental status type   3. Elevated CK   4. Muscle soreness     Disposition: Admit  This note was prepared with assistance of Dragon voice recognition software. Occasional wrong-word or sound-a-like substitutions may have occurred due to the inherent limitations of voice recognition software.     Aqib Lough, Gwenyth Allegra, MD 11/11/22 1136

## 2022-11-11 NOTE — ED Notes (Signed)
Able to establish 2nd IV but unable to pull blood work off, Attempts by two other people for a total of 3 people unable to draw blood cultures. Charge notified and Antibiotics started.

## 2022-11-11 NOTE — Assessment & Plan Note (Addendum)
-  Patient presenting with encephalopathy as evidenced by his confusion, difficulty getting off commode -Evaluation thus far unremarkable, other than frank confusion -He does appear to have a UTI (see below), and this is the likely source of his confusion -Based on unremarkable evaluation with current ability to protect his airway, will observe for now with IVF hydration; patient accepted to either [facility] or [facility] -He does have a mildly elevated CK which is likely related to his confusion and inability to move; will recheck CK in AM and continue IVF for now

## 2022-11-11 NOTE — Assessment & Plan Note (Signed)
-  Stage 3a, appears to be stable at this time -Attempt to avoid nephrotoxic medications -Recheck BMP in AM

## 2022-11-11 NOTE — Consult Note (Addendum)
Initial Consultation Note   Patient: Bruce Ball:[id number] DOB: [birth year] PCP: [name], MD DOA: 11/11/2022 DOS: the patient was seen and examined on 11/11/2022 Primary service: Karmen Bongo, MD  Referring physician: [place] Reason for consult: UTI causing AMS, ?mild rhabdo.  Incontinence, forgot how to stand off commode.  Head/neck CT negative.  No fever.  WBC 11.3, UA abnormal.  CK 700+.  Likely needs IVF, antibiotics.     Wife, [phone], Fredrik Mogel   Assessment and Plan: * Acute metabolic encephalopathy -Patient presenting with encephalopathy as evidenced by his confusion, difficulty getting off commode -Evaluation thus far unremarkable, other than frank confusion -He does appear to have a UTI (see below), and this is the likely source of his confusion -Based on unremarkable evaluation with current ability to protect his airway, will observe for now with IVF hydration; patient accepted to either [facility] or [facility] -He does have a mildly elevated CK which is likely related to his confusion and inability to move; will recheck CK in AM and continue IVF for now  UTI due to Klebsiella species -Prior +urine culture on 10/20 for Klebsiella -Sensitivities indicated that his UTI was susceptible to Keflex -However, patient appears to still/again have UTI  -Will treat with Rocephin -Repeat urine culture is pending  Sinus disease -He is scheduled (at long last, per his wife) for sinus surgery at [facility] on Wednesday, 11/15 -If his mentation clears and his symptoms are controlled by tomorrow, it may be reasonable to have this surgery as scheduled -However, if he is unable to be discharged tomorrow, his surgery will need to be rescheduled for a later date  Hypertension -Continue carvedilol; resume upon arrival at destination hospital  Hyperlipidemia -Continue atorvastatin; resume upon arrival at destination hospital  CKD (chronic kidney disease), stage III ([place]) -Stage  3a, appears to be stable at this time -Attempt to avoid nephrotoxic medications -Recheck BMP in AM   History of pulmonary embolism -Present in 2021 -It is unclear whether he needs lifelong AC -Eliquis is on hold for upcoming sinus surgery -Will continue to hold Eliquis for now -Will cover with DVT prevention Lovenox  Idiopathic neuropathy -Continue flexeril, gabapentin; resume upon arrival at destination hospital      HPI: Bruce Ball is a 76 y.o. male with past medical history of ankylosing spondylitis, HTN; PE (2021); and stage 3 CKD presenting with a fall, AMS.  He is really unable to provide history.  He reports back pain.  His wife found him stuck on the commode and he wet the bed prior.  He was acting very confused, which he usually only does when he has a lot of pain or is very dehydrated.  He was diagnosed with a UTI and he was treated with Keflex.  He did not complain of other issues until now.  He did complete the antibiotics.  His wife thinks he would want to be resuscitated.  He did complain of groin pain this AM when EMS was there.    He is scheduled for sinus surgery on Wednesday at [place].    Review of Systems: unable to review all systems due to the inability of the patient to answer questions. Past Medical History:  Diagnosis Date   Ankylosing spondylitis (HCC)    chronic back and leg pain   Arthritis of right knee    Basal cell carcinoma    Coronary artery calcification seen on CAT scan 03/27/2021   Deafness in right ear    Essential  tremor    Extrinsic asthma, unspecified 04/07/2009   PT DENIES   GERD (gastroesophageal reflux disease)    History of kidney stones    HYPERTENSION 10/29/2007   Neuropathy    Osteopenia    Other hyperlipidemia    Peptic ulcer    PUD, HX OF 04/21/2008   Pulmonary emboli ([place]) 2021   noted on CT 12-2019   Stage 3 chronic kidney disease (HCC)    Thyroid nodule    Past Surgical History:  Procedure Laterality Date   BACK  SURGERY     bone cancer  1970   rt femur removed and steel rod placed   [place], URETEROSCOPY AND STENT PLACEMENT Right 11/06/2017   Procedure: CYSTOSCOPY WITH  STENT PLACEMENT RIGHT;  Surgeon: Raynelle Bring, MD;  Location: [facility];  Service: Urology;  Laterality: Right;   CYSTOSCOPY WITH RETROGRADE PYELOGRAM, URETEROSCOPY AND STENT PLACEMENT Left 06/09/2019   Procedure: CYSTOSCOPY WITH RETROGRADE PYELOGRAM, URETEROSCOPY AND STENT PLACEMENT X2;  Surgeon: [name], MD;  Location: [facility];  Service: Urology;  Laterality: Left;   CYSTOSCOPY WITH RETROGRADE PYELOGRAM, URETEROSCOPY AND STENT PLACEMENT Bilateral 03/01/2020   Procedure: CYSTOSCOPY WITH RETROGRADE PYELOGRAM, URETEROSCOPY AND STENT PLACEMENT;  Surgeon: [name], MD;  Location: [facility];  Service: Urology;  Laterality: Bilateral;  75 MINS   CYSTOSCOPY/RETROGRADE/URETEROSCOPY     1 year ago at [place] in [place] Right 11/24/2017   Procedure: CYSTOSCOPY/URETEROSCOPY/ RETROGRADE/STENT REMOVAL;  Surgeon: Raynelle Bring, MD;  Location: [facility];  Service: Urology;  Laterality: Right;   HOLMIUM LASER APPLICATION Bilateral 12/30/9145   Procedure: HOLMIUM LASER APPLICATION;  Surgeon: [name], MD;  Location: [facility];  Service: Urology;  Laterality: Bilateral;   prosatectomy     Social History:  reports that he quit smoking about 43 years ago. His smoking use included cigarettes. He started smoking about 60 years ago. He has a 17.00 pack-year smoking history. He has never used smokeless tobacco. He reports that he does not drink alcohol and does not use drugs.  Allergies  Allergen Reactions   Erythromycin    Lisinopril     Reaction unknown; says lowered blood pressure too much    Family History  Problem Relation Age of Onset   COPD Father    Hypertension Father    Prostate cancer Brother    Stomach cancer Brother        on chemo in 2022- in 58s developed    Depression Brother        suicide after brain stem injury after accident   Stroke Brother        71 years old   Esophageal cancer Neg Hx    Pancreatic cancer Neg Hx    Colon cancer Neg Hx     Prior to Admission medications   Medication Sig Start Date End Date Taking? Authorizing Provider  acetaminophen (TYLENOL) 500 MG tablet Take by mouth. 08/06/22   [provider]  apixaban (ELIQUIS) 2.5 MG TABS tablet Take 1 tablet (2.5 mg total) by mouth 2 (two) times daily. 01/25/21   [name], MD  atorvastatin (LIPITOR) 20 MG tablet Take 1 tablet (20 mg total) by mouth at bedtime. 07/19/21   Sueanne Margarita, MD  Calcium Carb-Cholecalciferol 600-10 MG-MCG TABS Take 1 tablet by mouth 2 (two) times daily. 10/22/21 10/22/22  [provider]  carvedilol (COREG) 25 MG tablet Take 12.5 mg by mouth 2 (two) times daily.    [provider]  cephALEXin (KEFLEX) 500 MG capsule Take 1 capsule (500 mg total) by mouth 3 (three) times daily. Patient not taking: Reported on 10/30/2022 10/18/22   Elnora Morrison, MD  Cholecalciferol (VITAMIN D3) 50 MCG (2000 UT) capsule Take 2,000 Units by mouth in the morning and at bedtime.     [provider]  cyclobenzaprine (FLEXERIL) 10 MG tablet TAKE ONE-HALF TABLET BY MOUTH AT BEDTIME FOR MUSCLE PAIN * CAUSES HANGOVER, DO NOT DRIVE 2/33/00   [provider]  cyclobenzaprine (FLEXERIL) 10 MG tablet Take by mouth.    [provider]  ferrous sulfate 325 (65 FE) MG tablet Take 325 mg by mouth every Monday, Wednesday, and Friday.    [provider]  fluticasone (FLONASE) 50 MCG/ACT nasal spray Place 1-2 sprays into both nostrils daily as needed for allergies or rhinitis.    [provider]  gabapentin (NEURONTIN) 300 MG capsule Take 300 mg by mouth at bedtime.    [provider]  pantoprazole (PROTONIX) 40 MG tablet Take 40 mg by mouth 2 (two) times daily before a meal.    [provider]     Physical Exam: Vitals:   11/11/22 0941 11/11/22 1156 11/11/22 1200 11/11/22 1300  BP:   128/60 (!) 149/78  Pulse:   83 80  Resp:   16 17  Temp: 98.6 F (37 C) 99.2 F (37.3 C)    TempSrc: Oral Oral    SpO2:   97% 97%  Weight:       Physical exam was completed by RN, observed over FaceTime by MD  General:  Appears calm and comfortable and is in NAD, clearly confused Eyes:  EOMI, normal lids, iris ENT:  grossly normal hearing, lips & tongue, mmm; appropriate dentition Neck:  no LAD, masses or thyromegaly Cardiovascular:  RRR. 1-2+ LE edema.  Respiratory:   Normal respiratory effort.  Audible wheezes, per RN Abdomen:  firm, NT, mildly distended Skin:  no rash or induration seen on limited exam Musculoskeletal:  grossly normal tone BUE/BLE, no bony abnormality Psychiatric:  confused mood and affect, speech sparse, AOx1 Neurologic:  CN 2-12 grossly intact, moves all extremities in coordinated fashion, tenderness with    Radiological Exams on Admission: Independently reviewed - see discussion in A/P where applicable  CT Head Wo Contrast  Result Date: 11/11/2022 CLINICAL DATA:  Altered mental status and trauma EXAM: CT HEAD WITHOUT CONTRAST CT CERVICAL SPINE WITHOUT CONTRAST TECHNIQUE: Multidetector CT imaging of the head and cervical spine was performed following the standard protocol without intravenous contrast. Multiplanar CT image reconstructions of the cervical spine were also generated. RADIATION DOSE REDUCTION: This exam was performed according to the departmental dose-optimization program which includes automated exposure control, adjustment of the mA and/or kV according to patient size and/or use of iterative reconstruction technique. COMPARISON:  CT head and cervical spine 08/23/2021 FINDINGS: CT HEAD FINDINGS Brain: There is no acute intracranial hemorrhage, extra-axial fluid collection, or acute infarct Parenchymal volume is stable and within normal limits for age. The  ventricles are stable in size. Gray-white differentiation is preserved. Mild chronic small-vessel ischemic change is stable. There is no mass lesion.  There is no mass effect or midline shift. Vascular: There is calcification of the bilateral carotid siphons. Skull: Normal. Negative for fracture or focal lesion. Sinuses/Orbits: There is extensive near-complete opacification of the paranasal sinuses, worsened since 2022. Bilateral lens implants are in place. The globes and orbits are otherwise unremarkable. Other: There is a small left mastoid effusion. The  nasopharynx is grossly unremarkable. CT CERVICAL SPINE FINDINGS Alignment: Normal. Skull base and vertebrae: Skull base alignment is maintained. Vertebral body heights are preserved. There is no evidence of acute fracture. There is no suspicious osseous lesion. Soft tissues and spinal canal: No prevertebral fluid or swelling. No visible canal hematoma. Disc levels: There is overall mild multilevel degenerative endplate change, disc space narrowing, and facet arthropathy. There is no evidence of high-grade spinal canal stenosis. Upper chest: The imaged lung apices are clear. Other: None. IMPRESSION: 1. No acute intracranial pathology. 2. No acute fracture or traumatic malalignment of the cervical spine. 3. Pansinusitis, worsened since 2022. Electronically Signed   By: Valetta Mole M.D.   On: 11/11/2022 09:07   CT Cervical Spine Wo Contrast  Result Date: 11/11/2022 CLINICAL DATA:  Altered mental status and trauma EXAM: CT HEAD WITHOUT CONTRAST CT CERVICAL SPINE WITHOUT CONTRAST TECHNIQUE: Multidetector CT imaging of the head and cervical spine was performed following the standard protocol without intravenous contrast. Multiplanar CT image reconstructions of the cervical spine were also generated. RADIATION DOSE REDUCTION: This exam was performed according to the departmental dose-optimization program which includes automated exposure control, adjustment of the  mA and/or kV according to patient size and/or use of iterative reconstruction technique. COMPARISON:  CT head and cervical spine 08/23/2021 FINDINGS: CT HEAD FINDINGS Brain: There is no acute intracranial hemorrhage, extra-axial fluid collection, or acute infarct Parenchymal volume is stable and within normal limits for age. The ventricles are stable in size. Gray-white differentiation is preserved. Mild chronic small-vessel ischemic change is stable. There is no mass lesion.  There is no mass effect or midline shift. Vascular: There is calcification of the bilateral carotid siphons. Skull: Normal. Negative for fracture or focal lesion. Sinuses/Orbits: There is extensive near-complete opacification of the paranasal sinuses, worsened since 2022. Bilateral lens implants are in place. The globes and orbits are otherwise unremarkable. Other: There is a small left mastoid effusion. The nasopharynx is grossly unremarkable. CT CERVICAL SPINE FINDINGS Alignment: Normal. Skull base and vertebrae: Skull base alignment is maintained. Vertebral body heights are preserved. There is no evidence of acute fracture. There is no suspicious osseous lesion. Soft tissues and spinal canal: No prevertebral fluid or swelling. No visible canal hematoma. Disc levels: There is overall mild multilevel degenerative endplate change, disc space narrowing, and facet arthropathy. There is no evidence of high-grade spinal canal stenosis. Upper chest: The imaged lung apices are clear. Other: None. IMPRESSION: 1. No acute intracranial pathology. 2. No acute fracture or traumatic malalignment of the cervical spine. 3. Pansinusitis, worsened since 2022. Electronically Signed   By: Valetta Mole M.D.   On: 11/11/2022 09:07   DG Pelvis Portable  Result Date: 11/11/2022 CLINICAL DATA:  76 year old male with left hip and groin pain this morning. Previous right hip replacement. EXAM: PORTABLE PELVIS 1-2 VIEWS COMPARISON:  Pelvis radiographs 10/10/2021  and earlier. FINDINGS: Portable AP view at 0643 hours. Partially visible chronic right hip arthroplasty appears stable. Left femoral head is normally located. Left hip joint space appears stable since last year, within normal limits for age. Pelvis appears stable and intact. Chronic lumbosacral fusion and pelvic surgical clips. Nonobstructed visible bowel gas pattern. Grossly intact proximal left femur. No acute osseous abnormality identified. IMPRESSION: No acute osseous abnormality identified about the pelvis. If there is lateralizing hip pain then recommend dedicated hip series. Electronically Signed   By: Genevie Ann M.D.   On: 11/11/2022 06:56    EKG: Independently reviewed.  Sinus arrhythmia  with rate 83; no evidence of acute ischemia   Labs on Admission: I have personally reviewed the available labs and imaging studies at the time of the admission.  Pertinent labs:    Glucose 112 BUN 21/Creatinine 1.55/GFR 46 - stable HS troponin 8 CK 738 NH4 27 WBC 11.3 COVID/flu negative UA: large Hgb, large LE, + nitrite, trace protein, many bacteria, >50 RBC and WBC 10/20 urine culture + Klebsiella, resistant to ampicillin and Nitrofurantoin   Family Communication: None present; I spoke with his wife by telephone at the time of admission Primary team communication: I spoke directly with the ER team at the time of admission  Thank you very much for involving [name] in the care of your patient.  Author: Karmen Bongo, MD 11/11/2022 2:01 PM  For on call review [email or web address].

## 2022-11-11 NOTE — ED Notes (Signed)
At present pt is only alert to self. Pt is confused as to year or location. Pt can follow commands, pt passed stroke swallow screen. Pt  stated that he is not supposed to take eloquis but could not tell me why

## 2022-11-11 NOTE — ED Triage Notes (Addendum)
Arrives via EMS from home. Pt on toilet this morning, while getting up twisted his L leg under him, now experiencing L groin pain. Sat on toilet for 1 hr before calling EMS, unable to ambulate d/t pain, EMS reports significant assistance to transfer from sitting to stretcher. Arthroplasty R hip. Pt is slow to answer questions but wife tells EMS that this is normal for him when he is in pain.  160/78, 78bpm, 97%, 139CBG Wife to arrive to provide additional info shortly.   NIH 2 for not knowing month or age. Denies any pain during triage exam palpation of hips and knees.  Takes eliquis, uncertain if he hit his head.

## 2022-11-11 NOTE — ED Notes (Signed)
Pt had trouble lifting left leg on assessment and pain on palpitation of both feet. Pt states that he had neuropathy in both lower extremities. Slightly bi-lateral edema both lower extremities. Pt still oriented to self and age, but not to location or why heis here. Pt is clam and follows instructions well.

## 2022-11-11 NOTE — ED Notes (Signed)
Pt resting normal rise and fall of chest, Vitals at baseline

## 2022-11-11 NOTE — ED Provider Notes (Signed)
[facility] Provider Note   CSN: [id number] Arrival date & time: 11/11/22  2878     History  Chief Complaint  Patient presents with   Bruce Ball    Bruce Ball is a 76 y.o. male.  Patient is a 76 year old male presenting with his wife for altered mental status.  Unable to recall details of the events.  I spoke to patient's wife who states he got up to go to the bathroom in the middle the night and she found him on the toilet directly prior to arrival.  States she believed he was trying to change his depends and tripped twisting his foot under him.  Patient has a history of urinary incontinence and wears depends nightly.  Him and his wife sleep in separate beds so the timing is unknown.  Patient was found by his wife on the toilet in a strange seating position.  She states she was unable to get him up.  When EMS arrived he admitted to left hip pain and groin pain.  At this time patient denies any pain in the hip or the groin.  He is only alert and oriented x1.  Has difficulty recalling events and is can moderately confused.  Patient's wife states this is unlike his baseline.  The history is provided by the patient. No language interpreter was used.  Fall Pertinent negatives include no chest pain, no abdominal pain and no shortness of breath.       Home Medications Prior to Admission medications   Medication Sig Start Date End Date Taking? Authorizing Provider  acetaminophen (TYLENOL) 500 MG tablet Take by mouth. 08/06/22   [provider]  apixaban (ELIQUIS) 2.5 MG TABS tablet Take 1 tablet (2.5 mg total) by mouth 2 (two) times daily. 01/25/21   [name], MD  atorvastatin (LIPITOR) 20 MG tablet Take 1 tablet (20 mg total) by mouth at bedtime. 07/19/21   Sueanne Margarita, MD  Calcium Carb-Cholecalciferol 600-10 MG-MCG TABS Take 1 tablet by mouth 2 (two) times daily. 10/22/21 10/22/22  [provider]  carvedilol (COREG) 25 MG tablet Take 12.5  mg by mouth 2 (two) times daily.    [provider]  cephALEXin (KEFLEX) 500 MG capsule Take 1 capsule (500 mg total) by mouth 3 (three) times daily. Patient not taking: Reported on 10/30/2022 10/18/22   Elnora Morrison, MD  Cholecalciferol (VITAMIN D3) 50 MCG (2000 UT) capsule Take 2,000 Units by mouth in the morning and at bedtime.     [provider]  cyclobenzaprine (FLEXERIL) 10 MG tablet TAKE ONE-HALF TABLET BY MOUTH AT BEDTIME FOR MUSCLE PAIN * CAUSES HANGOVER, DO NOT DRIVE 6/76/72   [provider]  cyclobenzaprine (FLEXERIL) 10 MG tablet Take by mouth.    [provider]  ferrous sulfate 325 (65 FE) MG tablet Take 325 mg by mouth every Monday, Wednesday, and Friday.    [provider]  fluticasone (FLONASE) 50 MCG/ACT nasal spray Place 1-2 sprays into both nostrils daily as needed for allergies or rhinitis.    [provider]  gabapentin (NEURONTIN) 300 MG capsule Take 300 mg by mouth at bedtime.    [provider]  pantoprazole (PROTONIX) 40 MG tablet Take 40 mg by mouth 2 (two) times daily before a meal.    [provider]      Allergies    Erythromycin and Lisinopril    Review of Systems   Review of Systems  Constitutional:  Negative for chills  and fever.  HENT:  Positive for congestion and rhinorrhea. Negative for ear pain and sore throat.   Eyes:  Negative for pain and visual disturbance.  Respiratory:  Negative for cough and shortness of breath.   Cardiovascular:  Negative for chest pain and palpitations.  Gastrointestinal:  Negative for abdominal pain and vomiting.  Genitourinary:  Negative for dysuria and hematuria.  Musculoskeletal:  Negative for arthralgias and back pain.  Skin:  Negative for color change and rash.  Neurological:  Negative for seizures and syncope.  All other systems reviewed and are negative.   Physical Exam Updated Vital Signs BP (!) 145/65 (BP Location: Right Arm)   Pulse 72    Temp 98.3 F (36.8 C) (Oral)   Resp 20   Wt 108 kg   SpO2 95%   BMI 33.21 kg/m  Physical Exam Vitals and nursing note reviewed.  Constitutional:      General: He is not in acute distress.    Appearance: He is well-developed.  HENT:     Head: Normocephalic and atraumatic.  Eyes:     Conjunctiva/sclera: Conjunctivae normal.  Cardiovascular:     Rate and Rhythm: Normal rate and regular rhythm.     Heart sounds: No murmur heard. Pulmonary:     Effort: Pulmonary effort is normal. No respiratory distress.     Breath sounds: Normal breath sounds.  Abdominal:     Palpations: Abdomen is soft.     Tenderness: There is no abdominal tenderness.  Musculoskeletal:        General: No swelling.     Cervical back: Neck supple.  Skin:    General: Skin is warm and dry.     Capillary Refill: Capillary refill takes less than 2 seconds.  Neurological:     Mental Status: He is alert. He is disoriented.     GCS: GCS eye subscore is 4. GCS verbal subscore is 4. GCS motor subscore is 6.     Cranial Nerves: Cranial nerves 2-12 are intact.     Sensory: Sensation is intact.     Motor: Motor function is intact.  Psychiatric:        Mood and Affect: Mood normal.     ED Results / Procedures / Treatments   Labs (all labs ordered are listed, but only abnormal results are displayed) Labs Reviewed  RESP PANEL BY RT-PCR (FLU A&B, COVID) ARPGX2  CBC WITH DIFFERENTIAL/PLATELET  COMPREHENSIVE METABOLIC PANEL  LIPASE, BLOOD  URINALYSIS, ROUTINE W REFLEX MICROSCOPIC  TROPONIN I (HIGH SENSITIVITY)    EKG None  Radiology No results found.  Procedures Procedures    Medications Ordered in ED Medications - No data to display  ED Course/ Medical Decision Making/ A&P                           Medical Decision Making Amount and/or Complexity of Data Reviewed Labs: ordered. Radiology: ordered.   54:70 AM  76 year old male presenting with his wife for altered mental  status.        Final Clinical Impression(s) / ED Diagnoses Final diagnoses:  None    Rx / DC Orders ED Discharge Orders     None         Lianne Cure, DO 83/15/17 [phone]

## 2022-11-11 NOTE — ED Notes (Signed)
Patient's family member is refusing the CT head. She states he did not fall and did not hit his head. She states he just couldn't get off the toilet and she was unable to lift him. She states his mental status is at baseline.

## 2022-11-11 NOTE — ED Notes (Signed)
When asked a second time as to why the pt could not take eliquis and pt stated that he had an upcoming surgery. This RN will confirm a second time with pharm about same.

## 2022-11-11 NOTE — ED Notes (Signed)
Patient transported to CT 

## 2022-11-11 NOTE — ED Notes (Addendum)
Pt clammy, just took temp and will reassess. Pt states that he just feels bad but indicates that it's generalized. Pt still slow to answer questions but will follow commands and respond appropriately. Md notified.

## 2022-11-11 NOTE — ED Notes (Signed)
Pt able to follow commands and is alert and oriented x 4

## 2022-11-11 NOTE — H&P (Addendum)
History and Physical   Bruce Ball ZGY:[id number] DOB: [birth year] DOA: 11/11/2022  PCP: [name], MD   Patient coming from: Home  Chief Complaint: Fall, altered mental status  HPI: Bruce Ball is a 76 y.o. male with medical history significant of sinus disease, neuropathy, history of PE, CKD 3, hyperlipidemia, hypertension, PUD, prostate cancer, GERD, essential tremor, anemia, diastolic CHF, CAD presenting after fall at home being found down.  Patient apparently went to go to the bathroom in the middle the night, his wife found him in the morning on the toilet it appears that he may have tripped and fallen.  He likely stumbled when trying to change his depends which she wears nightly.  His wife was unable to get him up and EMS was called.  Unclear what time the fall occurred as they sleep in separate beds.  Increased confusion noticed in the ED.  No reports of fevers, chills, chest pain, shortness of breath, [place] pain, constipation, diarrhea, nausea, vomiting.  ED Course: All signs in the ED significant for fever to 100.5, blood pressure in the 591M to 384Y systolic, respiratory rate in the teens to 20s.  Lab work-up included CMP with creatinine of 1.55 near baseline, glucose 112, calcium 8.8, protein 6.4, T. bili 1.3.  CBC with leukocytosis to 11.3.  Troponin negative.  CK elevated at 738, lipase normal.  Rester panel flu COVID-negative urinalysis with hemoglobin, protein, nitrates, leukocytes, bacteria.  Urine culture and blood culture pending.  TSH and ammonia normal.  Pelvis x-ray without acute normality.  CT head showed no acute normality but did demonstrate sinus disease.  CT C-spine showed no acute abnormality.  Patient see Tylenol, ceftriaxone, 500 cc bolus of fluids and was started on home medications after initial hospitalist consult.  Review of Systems: As per HPI otherwise all other systems reviewed and are negative.  Past Medical History:  Diagnosis Date    Ankylosing spondylitis ([place])    chronic back and leg pain   Arthritis of right knee    Basal cell carcinoma    Community acquired pneumonia of right upper lobe of lung 11/05/2017   Coronary artery calcification seen on CAT scan 03/27/2021   Deafness in right ear    Essential tremor    Extrinsic asthma, unspecified 04/07/2009   PT DENIES   Femoral condyle fracture ([place]) 07/15/2019   GERD (gastroesophageal reflux disease)    Gross hematuria 06/14/2019   History of kidney stones    HYPERTENSION 10/29/2007   Neuropathy    Osteopenia    Other hyperlipidemia    Peptic ulcer    Periprosthetic fracture of shaft of femur 10/10/2021   PUD, HX OF 04/21/2008   Pulmonary emboli ([place]) 2021   noted on CT 12-2019   Stage 3 chronic kidney disease (HCC)    Thyroid nodule     Past Surgical History:  Procedure Laterality Date   BACK SURGERY     bone cancer  1970   rt femur removed and steel rod placed   [place], URETEROSCOPY AND STENT PLACEMENT Right 11/06/2017   Procedure: CYSTOSCOPY WITH  STENT PLACEMENT RIGHT;  Surgeon: Raynelle Bring, MD;  Location: [facility];  Service: Urology;  Laterality: Right;   CYSTOSCOPY WITH RETROGRADE PYELOGRAM, URETEROSCOPY AND STENT PLACEMENT Left 06/09/2019   Procedure: CYSTOSCOPY WITH RETROGRADE PYELOGRAM, URETEROSCOPY AND STENT PLACEMENT X2;  Surgeon: [name], MD;  Location: [facility];  Service: Urology;  Laterality: Left;   CYSTOSCOPY WITH RETROGRADE PYELOGRAM, URETEROSCOPY  AND STENT PLACEMENT Bilateral 03/01/2020   Procedure: CYSTOSCOPY WITH RETROGRADE PYELOGRAM, URETEROSCOPY AND STENT PLACEMENT;  Surgeon: [name], MD;  Location: [facility];  Service: Urology;  Laterality: Bilateral;  75 MINS   CYSTOSCOPY/RETROGRADE/URETEROSCOPY     1 year ago at [place] in [place] Right 11/24/2017   Procedure: CYSTOSCOPY/URETEROSCOPY/ RETROGRADE/STENT REMOVAL;  Surgeon: Raynelle Bring, MD;  Location: [facility];  Service: Urology;  Laterality: Right;   HOLMIUM LASER APPLICATION Bilateral 04/07/1855   Procedure: HOLMIUM LASER APPLICATION;  Surgeon: [name], MD;  Location: [facility];  Service: Urology;  Laterality: Bilateral;   prosatectomy      Social History  reports that he quit smoking about 43 years ago. His smoking use included cigarettes. He started smoking about 60 years ago. He has a 17.00 pack-year smoking history. He has never used smokeless tobacco. He reports that he does not drink alcohol and does not use drugs.  Allergies  Allergen Reactions   Erythromycin    Lisinopril     Reaction unknown; says lowered blood pressure too much    Family History  Problem Relation Age of Onset   COPD Father    Hypertension Father    Prostate cancer Brother    Stomach cancer Brother        on chemo in 2022- in 48s developed   Depression Brother        suicide after brain stem injury after accident   Stroke Brother        9 years old   Esophageal cancer Neg Hx    Pancreatic cancer Neg Hx    Colon cancer Neg Hx   Reviewed on admission  Prior to Admission medications   Medication Sig Start Date End Date Taking? Authorizing Provider  acetaminophen (TYLENOL) 500 MG tablet Take by mouth. 08/06/22   [provider]  apixaban (ELIQUIS) 2.5 MG TABS tablet Take 1 tablet (2.5 mg total) by mouth 2 (two) times daily. 01/25/21   [name], MD  atorvastatin (LIPITOR) 20 MG tablet Take 1 tablet (20 mg total) by mouth at bedtime. 07/19/21   Sueanne Margarita, MD  Calcium Carb-Cholecalciferol 600-10 MG-MCG TABS Take 1 tablet by mouth 2 (two) times daily. 10/22/21 10/22/22  [provider]  carvedilol (COREG) 25 MG tablet Take 12.5 mg by mouth 2 (two) times daily.    [provider]  Cholecalciferol (VITAMIN D3) 50 MCG (2000 UT) capsule Take 2,000 Units by mouth in the morning and at bedtime.     [provider]  cyclobenzaprine (FLEXERIL) 10 MG tablet TAKE ONE-HALF  TABLET BY MOUTH AT BEDTIME FOR MUSCLE PAIN * CAUSES HANGOVER, DO NOT DRIVE 03/12/96   [provider]  cyclobenzaprine (FLEXERIL) 10 MG tablet Take by mouth.    [provider]  ferrous sulfate 325 (65 FE) MG tablet Take 325 mg by mouth every Monday, Wednesday, and Friday.    [provider]  fluticasone (FLONASE) 50 MCG/ACT nasal spray Place 1-2 sprays into both nostrils daily as needed for allergies or rhinitis.    [provider]  gabapentin (NEURONTIN) 300 MG capsule Take 300 mg by mouth at bedtime.    [provider]  pantoprazole (PROTONIX) 40 MG tablet Take 40 mg by mouth 2 (two) times daily before a meal.    [provider]    Physical Exam: Vitals:   11/11/22 1900 11/11/22 2200 11/11/22 2306 11/11/22 2317  BP: (!) 172/78 (!) 165/71 (!) 168/83  Pulse: 71 93 85   Resp: (!) 23 (!) 25 (!) 22   Temp:   100.3 F (37.9 C)   TempSrc:   Oral   SpO2: 99% 94% 95%   Weight:    111 kg  Height:    '5\' 11"'$  (1.803 m)    Physical Exam Constitutional:      General: He is not in acute distress.    Comments: Tired appearing when seen  HENT:     Head: Normocephalic and atraumatic.     Mouth/Throat:     Mouth: Mucous membranes are moist.     Pharynx: Oropharynx is clear.  Eyes:     Extraocular Movements: Extraocular movements intact.     Pupils: Pupils are equal, round, and reactive to light.  Cardiovascular:     Rate and Rhythm: Normal rate and regular rhythm.     Pulses: Normal pulses.     Heart sounds: Normal heart sounds.  Pulmonary:     Effort: Pulmonary effort is normal. No respiratory distress.     Breath sounds: Normal breath sounds.  Abdominal:     General: Bowel sounds are normal. There is no distension.     Palpations: Abdomen is soft.     Tenderness: There is no abdominal tenderness.  Musculoskeletal:        General: No swelling or deformity.  Skin:    General: Skin is warm and dry.  Neurological:     General: No  focal deficit present.     Mental Status: Mental status is at baseline.    Labs on Admission: I have personally reviewed following labs and imaging studies  CBC: Recent Labs  Lab 11/11/22 0602  WBC 11.3*  NEUTROABS 8.7*  HGB 14.8  HCT 45.3  MCV 87.3  PLT 035    Basic Metabolic Panel: Recent Labs  Lab 11/11/22 0602  NA 140  K 3.9  CL 105  CO2 24  GLUCOSE 112*  BUN 21  CREATININE 1.55*  CALCIUM 8.8*    GFR: Estimated Creatinine Clearance: 51.4 mL/min (A) (by C-G formula based on SCr of 1.55 mg/dL (H)).  Liver Function Tests: Recent Labs  Lab 11/11/22 0602  AST 14*  ALT 11  ALKPHOS 71  BILITOT 1.3*  PROT 6.4*  ALBUMIN 3.6    Urine analysis:    Component Value Date/Time   COLORURINE YELLOW 11/11/2022 0602   APPEARANCEUR HAZY (A) 11/11/2022 0602   LABSPEC 1.014 11/11/2022 0602   PHURINE 6.0 11/11/2022 0602   GLUCOSEU NEGATIVE 11/11/2022 0602   HGBUR LARGE (A) 11/11/2022 0602   HGBUR large 02/26/2010 1214   BILIRUBINUR NEGATIVE 11/11/2022 0602   BILIRUBINUR 1+ 06/26/2016 1117   KETONESUR NEGATIVE 11/11/2022 0602   PROTEINUR TRACE (A) 11/11/2022 0602   UROBILINOGEN 1.0 06/26/2016 1117   UROBILINOGEN 1.0 03/05/2010 2252   NITRITE POSITIVE (A) 11/11/2022 0602   LEUKOCYTESUR LARGE (A) 11/11/2022 0602    Radiological Exams on Admission: CT Head Wo Contrast  Result Date: 11/11/2022 CLINICAL DATA:  Altered mental status and trauma EXAM: CT HEAD WITHOUT CONTRAST CT CERVICAL SPINE WITHOUT CONTRAST TECHNIQUE: Multidetector CT imaging of the head and cervical spine was performed following the standard protocol without intravenous contrast. Multiplanar CT image reconstructions of the cervical spine were also generated. RADIATION DOSE REDUCTION: This exam was performed according to the departmental dose-optimization program which includes automated exposure control, adjustment of the mA and/or kV according to patient size and/or use of iterative reconstruction  technique. COMPARISON:  CT head  and cervical spine 08/23/2021 FINDINGS: CT HEAD FINDINGS Brain: There is no acute intracranial hemorrhage, extra-axial fluid collection, or acute infarct Parenchymal volume is stable and within normal limits for age. The ventricles are stable in size. Gray-white differentiation is preserved. Mild chronic small-vessel ischemic change is stable. There is no mass lesion.  There is no mass effect or midline shift. Vascular: There is calcification of the bilateral carotid siphons. Skull: Normal. Negative for fracture or focal lesion. Sinuses/Orbits: There is extensive near-complete opacification of the paranasal sinuses, worsened since 2022. Bilateral lens implants are in place. The globes and orbits are otherwise unremarkable. Other: There is a small left mastoid effusion. The nasopharynx is grossly unremarkable. CT CERVICAL SPINE FINDINGS Alignment: Normal. Skull base and vertebrae: Skull base alignment is maintained. Vertebral body heights are preserved. There is no evidence of acute fracture. There is no suspicious osseous lesion. Soft tissues and spinal canal: No prevertebral fluid or swelling. No visible canal hematoma. Disc levels: There is overall mild multilevel degenerative endplate change, disc space narrowing, and facet arthropathy. There is no evidence of high-grade spinal canal stenosis. Upper chest: The imaged lung apices are clear. Other: None. IMPRESSION: 1. No acute intracranial pathology. 2. No acute fracture or traumatic malalignment of the cervical spine. 3. Pansinusitis, worsened since 2022. Electronically Signed   By: Valetta Mole M.D.   On: 11/11/2022 09:07   CT Cervical Spine Wo Contrast  Result Date: 11/11/2022 CLINICAL DATA:  Altered mental status and trauma EXAM: CT HEAD WITHOUT CONTRAST CT CERVICAL SPINE WITHOUT CONTRAST TECHNIQUE: Multidetector CT imaging of the head and cervical spine was performed following the standard protocol without intravenous  contrast. Multiplanar CT image reconstructions of the cervical spine were also generated. RADIATION DOSE REDUCTION: This exam was performed according to the departmental dose-optimization program which includes automated exposure control, adjustment of the mA and/or kV according to patient size and/or use of iterative reconstruction technique. COMPARISON:  CT head and cervical spine 08/23/2021 FINDINGS: CT HEAD FINDINGS Brain: There is no acute intracranial hemorrhage, extra-axial fluid collection, or acute infarct Parenchymal volume is stable and within normal limits for age. The ventricles are stable in size. Gray-white differentiation is preserved. Mild chronic small-vessel ischemic change is stable. There is no mass lesion.  There is no mass effect or midline shift. Vascular: There is calcification of the bilateral carotid siphons. Skull: Normal. Negative for fracture or focal lesion. Sinuses/Orbits: There is extensive near-complete opacification of the paranasal sinuses, worsened since 2022. Bilateral lens implants are in place. The globes and orbits are otherwise unremarkable. Other: There is a small left mastoid effusion. The nasopharynx is grossly unremarkable. CT CERVICAL SPINE FINDINGS Alignment: Normal. Skull base and vertebrae: Skull base alignment is maintained. Vertebral body heights are preserved. There is no evidence of acute fracture. There is no suspicious osseous lesion. Soft tissues and spinal canal: No prevertebral fluid or swelling. No visible canal hematoma. Disc levels: There is overall mild multilevel degenerative endplate change, disc space narrowing, and facet arthropathy. There is no evidence of high-grade spinal canal stenosis. Upper chest: The imaged lung apices are clear. Other: None. IMPRESSION: 1. No acute intracranial pathology. 2. No acute fracture or traumatic malalignment of the cervical spine. 3. Pansinusitis, worsened since 2022. Electronically Signed   By: Valetta Mole M.D.    On: 11/11/2022 09:07   DG Pelvis Portable  Result Date: 11/11/2022 CLINICAL DATA:  76 year old male with left hip and groin pain this morning. Previous right hip replacement. EXAM: PORTABLE PELVIS  1-2 VIEWS COMPARISON:  Pelvis radiographs 10/10/2021 and earlier. FINDINGS: Portable AP view at 0643 hours. Partially visible chronic right hip arthroplasty appears stable. Left femoral head is normally located. Left hip joint space appears stable since last year, within normal limits for age. Pelvis appears stable and intact. Chronic lumbosacral fusion and pelvic surgical clips. Nonobstructed visible bowel gas pattern. Grossly intact proximal left femur. No acute osseous abnormality identified. IMPRESSION: No acute osseous abnormality identified about the pelvis. If there is lateralizing hip pain then recommend dedicated hip series. Electronically Signed   By: Genevie Ann M.D.   On: 11/11/2022 06:56    EKG: Independently reviewed.  Sinus arrhythmia at 83 bpm.  Nonspecific T wave flattening.  Assessment/Plan Principal Problem:   Acute metabolic encephalopathy Active Problems:   UTI due to Klebsiella species   Sinus disease   Essential hypertension   GERD (gastroesophageal reflux disease)   Essential tremor   Idiopathic neuropathy   CAD (coronary artery disease)   History of pulmonary embolism   Chronic diastolic CHF (congestive heart failure) (HCC)   CKD (chronic kidney disease), stage III (HCC)   Hyperlipidemia   Hypertension   PUD (peptic ulcer disease)   Acute encephalopathy > Presenting with confusion in the ED.  Unable to get off commode after fall overnight. > Likely secondary to UTI, see below.  No other likely etiology identified thus far. - Treat UTI as below - Supportive care  UTI > Evidence of UTI in the ED with urinalysis showing hemoglobin, protein, nitrate, leukocytes, bacteria.  Leukocytosis 11.3. > Prior culture showed Klebsiella susceptible to Keflex - Continue with  ceftriaxone - Trend fever curve and WBC - Follow-up urine culture and blood culture  Sinus disease > Reportedly is scheduled for sinus surgery at [facility] on 11/15 > Goal would be to discharge him tomorrow if appropriate/in mentation improved to get him to his surgery if possible  Hypertension - Continue home carvedilol  Hyperlipidemia - Continue home atorvastatin  CKD 3 > Creatinine stable/near baseline at 1.55 in the ED. - Trend renal function and electrolytes  Neuropathy - Continue home gabapentin  History of pulmonary embolus - Has been on Eliquis outpatient but this is currently being held for upcoming sinus surgery  PUD GERD - Continue home PPI  Diastolic CHF > Last echo in 2023 with EF 65-70%, indeterminate diastolic function, normal RV function. - Not currently on any diuretics, continuing carvedilol as above  CAD > By imaging - Continue atorvastatin and carvedilol as above - On Eliquis as above, currently being held  DVT prophylaxis: Lovenox Code Status:   Full Family Communication:  None on admission, previously updated by consulting hospitalist Disposition Plan:   Patient is from:  Home  Anticipated DC to:  Home  Anticipated DC date:  1 to 2 days  Anticipated DC barriers: None  Consults called:  None Admission status:  Observation, MedSurg  Severity of Illness: The appropriate patient status for this patient is OBSERVATION. Observation status is judged to be reasonable and necessary in order to provide the required intensity of service to ensure the patient's safety. The patient's presenting symptoms, physical exam findings, and initial radiographic and laboratory data in the context of their medical condition is felt to place them at decreased risk for further clinical deterioration. Furthermore, it is anticipated that the patient will be medically stable for discharge from the hospital within 2 midnights of admission.    Marcelyn Bruins  MD [facility]  How to  contact the [facility] Attending or Consulting provider [place] or covering provider during after hours [place], for this patient?   Check the care team in [facility] and look for a) attending/consulting [facility] provider listed and b) the [facility] team listed Log into [email or web address] and use [place]'s universal password to access. If you do not have the password, please contact the hospital operator. Locate the [facility] provider you are looking for under [facility] and page to a number that you can be directly reached. If you still have difficulty reaching the provider, please page the [facility] (Director on Call) for the Hospitalists listed on amion for assistance.  11/11/2022, 11:45 PM

## 2022-11-11 NOTE — Assessment & Plan Note (Signed)
-  Continue carvedilol; resume upon arrival at destination hospital

## 2022-11-11 NOTE — ED Notes (Addendum)
Pt able to bear weight standing up but is a 2 assist. Pt can stand on own and  can pivot. Pt should still be considered a high fall risk

## 2022-11-11 NOTE — Assessment & Plan Note (Addendum)
-  Continue flexeril, gabapentin; resume upon arrival at destination hospital

## 2022-11-11 NOTE — Plan of Care (Signed)

## 2022-11-11 NOTE — Assessment & Plan Note (Addendum)
-  Continue atorvastatin; resume upon arrival at destination hospital

## 2022-11-11 NOTE — Assessment & Plan Note (Signed)
-  He is scheduled (at long last, per his wife) for sinus surgery at [facility] on Wednesday, 11/15 -If his mentation clears and his symptoms are controlled by tomorrow, it may be reasonable to have this surgery as scheduled -However, if he is unable to be discharged tomorrow, his surgery will need to be rescheduled for a later date

## 2022-11-11 NOTE — Assessment & Plan Note (Signed)
-  Prior +urine culture on 10/20 for Klebsiella -Sensitivities indicated that his UTI was susceptible to Keflex -However, patient appears to still/again have UTI  -Will treat with Rocephin -Repeat urine culture is pending

## 2022-11-11 NOTE — Assessment & Plan Note (Addendum)
-  Present in 2021 -It is unclear whether he needs lifelong AC -Eliquis is on hold for upcoming sinus surgery -Will continue to hold Eliquis for now -Will cover with DVT prevention Lovenox

## 2022-11-11 NOTE — ED Notes (Signed)
Pt alert to self, and age, pt unsure of month or year. Pt unable to give location. Pt still able to follow commands, pt still lethargic.

## 2022-11-11 NOTE — ED Notes (Signed)
Pt has a calm disposition and follow commands, Pt is alert to self, age, location and president. Pt is not sure of month.

## 2022-11-11 NOTE — ED Notes (Signed)
Pt sleeping when coming into room to updated about plan of care and transfer. Pt arousable to verbal stimuli but cont to be slow to answer questions. Pt move up in bed and repositioned for comfort.

## 2022-11-12 DIAGNOSIS — N390 Urinary tract infection, site not specified: Secondary | ICD-10-CM | POA: Diagnosis present

## 2022-11-12 DIAGNOSIS — Z1611 Resistance to penicillins: Secondary | ICD-10-CM | POA: Diagnosis present

## 2022-11-12 DIAGNOSIS — Z20822 Contact with and (suspected) exposure to covid-19: Secondary | ICD-10-CM | POA: Diagnosis present

## 2022-11-12 DIAGNOSIS — G250 Essential tremor: Secondary | ICD-10-CM | POA: Diagnosis present

## 2022-11-12 DIAGNOSIS — Z87891 Personal history of nicotine dependence: Secondary | ICD-10-CM | POA: Diagnosis not present

## 2022-11-12 DIAGNOSIS — Z818 Family history of other mental and behavioral disorders: Secondary | ICD-10-CM | POA: Diagnosis not present

## 2022-11-12 DIAGNOSIS — Z800 Family history of malignant neoplasm of digestive organs: Secondary | ICD-10-CM | POA: Diagnosis not present

## 2022-11-12 DIAGNOSIS — E860 Dehydration: Secondary | ICD-10-CM | POA: Diagnosis present

## 2022-11-12 DIAGNOSIS — Z8249 Family history of ischemic heart disease and other diseases of the circulatory system: Secondary | ICD-10-CM | POA: Diagnosis not present

## 2022-11-12 DIAGNOSIS — N1831 Chronic kidney disease, stage 3a: Secondary | ICD-10-CM | POA: Diagnosis present

## 2022-11-12 DIAGNOSIS — R4182 Altered mental status, unspecified: Secondary | ICD-10-CM | POA: Diagnosis present

## 2022-11-12 DIAGNOSIS — I5032 Chronic diastolic (congestive) heart failure: Secondary | ICD-10-CM | POA: Diagnosis present

## 2022-11-12 DIAGNOSIS — Z86711 Personal history of pulmonary embolism: Secondary | ICD-10-CM | POA: Diagnosis not present

## 2022-11-12 DIAGNOSIS — T796XXA Traumatic ischemia of muscle, initial encounter: Secondary | ICD-10-CM | POA: Diagnosis present

## 2022-11-12 DIAGNOSIS — Z8042 Family history of malignant neoplasm of prostate: Secondary | ICD-10-CM | POA: Diagnosis not present

## 2022-11-12 DIAGNOSIS — Z8546 Personal history of malignant neoplasm of prostate: Secondary | ICD-10-CM | POA: Diagnosis not present

## 2022-11-12 DIAGNOSIS — K219 Gastro-esophageal reflux disease without esophagitis: Secondary | ICD-10-CM | POA: Diagnosis present

## 2022-11-12 DIAGNOSIS — H9191 Unspecified hearing loss, right ear: Secondary | ICD-10-CM | POA: Diagnosis present

## 2022-11-12 DIAGNOSIS — Z823 Family history of stroke: Secondary | ICD-10-CM | POA: Diagnosis not present

## 2022-11-12 DIAGNOSIS — B961 Klebsiella pneumoniae [K. pneumoniae] as the cause of diseases classified elsewhere: Secondary | ICD-10-CM | POA: Diagnosis present

## 2022-11-12 DIAGNOSIS — I130 Hypertensive heart and chronic kidney disease with heart failure and stage 1 through stage 4 chronic kidney disease, or unspecified chronic kidney disease: Secondary | ICD-10-CM | POA: Diagnosis present

## 2022-11-12 DIAGNOSIS — Z85828 Personal history of other malignant neoplasm of skin: Secondary | ICD-10-CM | POA: Diagnosis not present

## 2022-11-12 DIAGNOSIS — E7849 Other hyperlipidemia: Secondary | ICD-10-CM | POA: Diagnosis present

## 2022-11-12 DIAGNOSIS — G9341 Metabolic encephalopathy: Secondary | ICD-10-CM | POA: Diagnosis present

## 2022-11-12 DIAGNOSIS — I2510 Atherosclerotic heart disease of native coronary artery without angina pectoris: Secondary | ICD-10-CM | POA: Diagnosis present

## 2022-11-12 LAB — BASIC METABOLIC PANEL
Anion gap: 8 (ref 5–15)
BUN: 20 mg/dL (ref 8–23)
CO2: 22 mmol/L (ref 22–32)
Calcium: 8.2 mg/dL — ABNORMAL LOW (ref 8.9–10.3)
Chloride: 107 mmol/L (ref 98–111)
Creatinine, Ser: 1.31 mg/dL — ABNORMAL HIGH (ref 0.61–1.24)
GFR, Estimated: 56 mL/min — ABNORMAL LOW (ref >60)
Glucose, Bld: 106 mg/dL — ABNORMAL HIGH (ref 70–99)
Potassium: 3.5 mmol/L (ref 3.5–5.1)
Sodium: 137 mmol/L (ref 135–145)

## 2022-11-12 LAB — CBC
HCT: 44.7 % (ref 39.0–52.0)
Hemoglobin: 14.5 g/dL (ref 13.0–17.0)
MCH: 28.7 pg (ref 26.0–34.0)
MCHC: 32.4 g/dL (ref 30.0–36.0)
MCV: 88.5 fL (ref 80.0–100.0)
Platelets: 164 10*3/uL (ref 150–400)
RBC: 5.05 MIL/uL (ref 4.22–5.81)
RDW: 13.5 % (ref 11.5–15.5)
WBC: 7.2 10*3/uL (ref 4.0–10.5)
nRBC: 0 % (ref 0.0–0.2)

## 2022-11-12 LAB — CK
Total CK: 1820 U/L — ABNORMAL HIGH (ref 49–397)
Total CK: 1655 U/L — ABNORMAL HIGH (ref 49–397)

## 2022-11-12 MED ORDER — CARVEDILOL 25 MG PO TABS
25.0000 mg | ORAL_TABLET | Freq: Two times a day (BID) | ORAL | Status: DC
  Administered 2022-11-12 – 2022-11-13 (×2): 25 mg via ORAL
  Filled 2022-11-12 (×2): qty 1

## 2022-11-12 MED ORDER — ENSURE ENLIVE PO LIQD
237.0000 mL | Freq: Two times a day (BID) | ORAL | Status: DC
  Administered 2022-11-12 – 2022-11-13 (×2): 237 mL via ORAL

## 2022-11-12 MED ORDER — ADULT MULTIVITAMIN W/MINERALS CH
1.0000 | ORAL_TABLET | Freq: Every day | ORAL | Status: DC
  Administered 2022-11-12: 1 via ORAL
  Filled 2022-11-12 (×2): qty 1

## 2022-11-12 MED ORDER — ALUM & MAG HYDROXIDE-SIMETH 200-200-20 MG/5ML PO SUSP: 15.0000 mL | Freq: Four times a day (QID) | ORAL | Status: DC | PRN

## 2022-11-12 MED ORDER — PANTOPRAZOLE SODIUM 40 MG PO TBEC
40.0000 mg | DELAYED_RELEASE_TABLET | Freq: Every day | ORAL | Status: AC
  Administered 2022-11-12: 40 mg via ORAL
  Filled 2022-11-12: qty 1

## 2022-11-12 NOTE — Evaluation (Signed)
Physical Therapy Evaluation Patient Details Name: Bruce Ball MRN: [id number] DOB: 07-09-1946 Today's Date: 11/12/2022  History of Present Illness  Bruce Ball is a 76 y.o. male admitted with acute metabolic encephalopathy. Head/neck CT negative. PMH: ankylosing spondylitis, HTN, PE (2021), osteopenia, essential tremor and stage 3 CKD  Clinical Impression  Pt admitted with above diagnosis. Pt from home, spouse reports pt using SPC or rollator to ambulate, reports multiple falls, active with OPPT. Pt needing min A to power to stand, using rocking momentum and BUE assisting to power up. Pt taking limited steps in room with RW, slow, slightly unsteady steps without BLE buckling noted, pt declines further ambulation due to fear of falling. Pt able to follow commands with increased time and cues, disoriented to year and month but able to state name, DOB, current president and location correctly. Recommending HHPT and family support at d/c; spouse reports all DME needs met at home. Pt currently with functional limitations due to the deficits listed below (see PT Problem List). Pt will benefit from skilled PT to increase their independence and safety with mobility to allow discharge to the venue listed below.          Recommendations for follow up therapy are one component of a multi-disciplinary discharge planning process, led by the attending physician.  Recommendations may be updated based on patient status, additional functional criteria and insurance authorization.  Follow Up Recommendations Home health PT      Assistance Recommended at Discharge Frequent or constant Supervision/Assistance  Patient can return home with the following  A little help with walking and/or transfers;A little help with bathing/dressing/bathroom;Assistance with cooking/housework;Assist for transportation    Equipment Recommendations None recommended by PT  Recommendations for Other Services       Functional  Status Assessment Patient has had a recent decline in their functional status and demonstrates the ability to make significant improvements in function in a reasonable and predictable amount of time.     Precautions / Restrictions Precautions Precautions: Fall Precaution Comments: Unabel to state how often he falls but did refer to big fall where he went to [facility] for surgery and had rehab. Restrictions Weight Bearing Restrictions: No      Mobility  Bed Mobility Overal bed mobility: Needs Assistance Bed Mobility: Sit to Supine  Sit to supine: Min guard  General bed mobility comments: increased time and effort to lift BLE Back into bed, no physical assistance    Transfers Overall transfer level: Needs assistance Equipment used: Rolling walker (2 wheels) Transfers: Sit to/from Stand Sit to Stand: Min assist  General transfer comment: min A with rocking momentum and BUE assisting to power to stand from recliner    Ambulation/Gait Ambulation/Gait assistance: Min guard  Assistive device: Rolling walker (2 wheels) Gait Pattern/deviations: Step-to pattern Gait velocity: decreased  General Gait Details: pt takes 6 steps forward and backwards at bedside, able to perform standing marching with RW, declines mobilizing away from bed due to fear of falling, min guard to steady and therapist managing lines for safety  Stairs            Wheelchair Mobility    Modified Rankin (Stroke Patients Only)       Balance Overall balance assessment: Needs assistance, History of Falls Sitting-balance support: Feet supported, Bilateral upper extremity supported Sitting balance-Leahy Scale: Fair  Standing balance support: Bilateral upper extremity supported, During functional activity, Reliant on assistive device for balance Standing balance-Leahy Scale: Poor  Pertinent Vitals/Pain Pain Assessment Pain Assessment: Faces Faces Pain Scale: Hurts whole lot Pain Location: Back and L  hip Pain Descriptors / Indicators: Aching, Grimacing, Guarding Pain Intervention(s): Limited activity within patient's tolerance, Monitored during session, Repositioned    Home Living Family/patient expects to be discharged to:: Private residence Living Arrangements: Spouse/significant other Available Help at Discharge: Family;Available 24 hours/day Type of Home: House Home Access: Stairs to enter   [employer] of Steps: threshold   Home Layout: Two level;Able to live on main level with bedroom/bathroom Home Equipment: Rollator (4 wheels);Cane - single point;BSC/3in1;Wheelchair - manual;Shower seat (bedrail, lift chair) Additional Comments: spouse in room confirming home set up and DME    Prior Function Prior Level of Function : Independent/Modified Independent  Mobility Comments: spouse reports pt using nothing in the home, using [facility] or rollator in the community ADLs Comments: spouse reports pt ind with self care, spouse completing household chores     Hand Dominance   Dominant Hand: Right    Extremity/Trunk Assessment   Upper Extremity Assessment Upper Extremity Assessment: Defer to OT evaluation RUE Deficits / Details: PROM WFL Strength:  shoulder 3/5, biceps/triceps 4/5, grip 3+/5. Pt with essential tremor RUE Sensation: history of peripheral neuropathy RUE Coordination: decreased fine motor;decreased gross motor LUE Deficits / Details: PROM: 130 flextion then pain. Strength:  shoulder 3/5, biceps/triceps 4/5, grip 3+/5. essential tremor LUE: Shoulder pain with ROM LUE Sensation: history of peripheral neuropathy LUE Coordination: decreased fine motor;decreased gross motor    Lower Extremity Assessment Lower Extremity Assessment: RLE deficits/detail;LLE deficits/detail RLE Deficits / Details: AROM WFL, knee extension 4-/5, hip flexion 3+/5, hip adduction 3+/5, hip abduction 3+/5 RLE Sensation: history of peripheral neuropathy RLE Coordination: WNL LLE  Deficits / Details: AROM WFL, knee extension 4-/5, hip flexion 3+/5, hip adduction 3+/5, hip abduction 3+/5 LLE Sensation: history of peripheral neuropathy LLE Coordination: WNL    Cervical / Trunk Assessment Cervical / Trunk Assessment: Normal  Communication   Communication: Expressive difficulties  Cognition Arousal/Alertness: Awake/alert Behavior During Therapy: Flat affect Overall Cognitive Status: Impaired/Different from baseline  Orientation Level: Person, Place  Following Commands: Follows one step commands with increased time  Problem Solving: Slow processing, Decreased initiation, Requires verbal cues, Requires tactile cues General Comments: pt slow to answer, reports month is "23", follows commands with increased time and cues, spouse at bedside reports not at baseline mentally        General Comments General comments (skin integrity, edema, etc.): Pt with word finding problems, decreased processing time and motor planning deficts affecting all adls and mobility. Pt is a fall risk.    Exercises     Assessment/Plan    PT Assessment Patient needs continued PT services  PT Problem List Decreased strength;Decreased activity tolerance;Decreased balance;Decreased mobility;Decreased cognition;Decreased knowledge of use of DME;Pain;Impaired sensation       PT Treatment Interventions DME instruction;Gait training;Functional mobility training;Therapeutic activities;Therapeutic exercise;Balance training;Patient/family education    PT Goals (Current goals can be found in the Care Plan section)  Acute Rehab PT Goals Patient Stated Goal: return home, therapy PT Goal Formulation: With patient/family Time For Goal Achievement: 11/26/22 Potential to Achieve Goals: Good    Frequency Min 3X/week     Co-evaluation               AM-PAC PT "6 Clicks" Mobility  Outcome Measure Help needed turning from your back to your side while in a flat bed without using bedrails?: A  Little Help needed moving from lying on  your back to sitting on the side of a flat bed without using bedrails?: A Little Help needed moving to and from a bed to a chair (including a wheelchair)?: A Little Help needed standing up from a chair using your arms (e.g., wheelchair or bedside chair)?: A Little Help needed to walk in hospital room?: A Lot Help needed climbing 3-5 steps with a railing? : A Lot 6 Click Score: 16    End of Session Equipment Utilized During Treatment: Gait belt Activity Tolerance: Patient tolerated treatment well Patient left: in bed;with call bell/phone within reach;with bed alarm set;with family/visitor present Nurse Communication: Mobility status;Other (comment) (nausea and dry heaving while seated EOB) PT Visit Diagnosis: Other abnormalities of gait and mobility (R26.89);Muscle weakness (generalized) (M62.81)    Time: 1594-7076 PT Time Calculation (min) (ACUTE ONLY): 22 min   Charges:   PT Evaluation $PT Eval Moderate Complexity: 1 Mod           Tori Karn Derk PT, DPT 11/12/22, 11:50 AM

## 2022-11-12 NOTE — Progress Notes (Signed)
Initial Nutrition Assessment  DOCUMENTATION CODES:   Obesity unspecified  INTERVENTION:   -Ensure Plus High Protein po BID, each supplement provides 350 kcal and 20 grams of protein.   -Multivitamin with minerals daily  NUTRITION DIAGNOSIS:   Inadequate oral intake related to lethargy/confusion, poor appetite as evidenced by per patient/family report.  GOAL:   Patient will meet greater than or equal to 90% of their needs  MONITOR:   PO intake, Supplement acceptance, Labs, Weight trends, I & O's  REASON FOR ASSESSMENT:   Consult Assessment of nutrition requirement/status  ASSESSMENT:   76 year old gentleman with history of chronic sinus issues, neuropathy, history of pulmonary embolism, stage IIIa chronic kidney disease, hypertension hyperlipidemia, history of prostate cancer status postresection and coronary artery disease presented from home with fall and loss of balance.  Patient in room, wife at bedside. Pt HOH and sometimes takes a while for pt to answer a question. Pt reports he has not been eating well, poor appetite. Also grimaces in pain and points to his stomach. Pt only ate a few bites of raisin bran this morning. Pt reports when he does have normal appetite he only eats 2 meals a day. Eats cereal with almond milk in the morning and consumes a small lunch consisting of meat and starch, sometimes salads or other vegetables. Typically doesn't eat dinner as he states he is too full.  Pt is agreeable to trying Ensure supplements while appetite is down.  Pt reports bland tastes r/t his sinuses. Awaiting sinus surgery once discharged. Pt denies any issues with swallowing or chewing.  Per weight records,  weights have been increasing.  Medications: Colace, Lactated ringers, Zofran  Labs reviewed.   NUTRITION - FOCUSED PHYSICAL EXAM:  No depletions noted.  Diet Order:   Diet Order             Diet regular Room service appropriate? Yes; Fluid consistency: Thin   Diet effective ____                   EDUCATION NEEDS:   Not appropriate for education at this time  Skin:  Skin Assessment: Reviewed RN Assessment  Last BM:  11/13 -type 6  Height:   Ht Readings from Last 1 Encounters:  11/11/22 '5\' 11"'$  (1.803 m)    Weight:   Wt Readings from Last 1 Encounters:  11/11/22 111 kg    BMI:  Body mass index is 34.13 kg/m.  Estimated Nutritional Needs:   Kcal:  1950-2150  Protein:  95-105g  Fluid:  2L/day  Clayton Bibles, MS, RD, LDN Inpatient Clinical Dietitian Contact information available via Amion

## 2022-11-12 NOTE — Progress Notes (Signed)
PROGRESS NOTE    Bruce Ball  ZOX:[id number] DOB: [birth year] DOA: 11/11/2022 PCP: [name], MD    Brief Narrative:  76 year old gentleman with history of chronic sinus issues, neuropathy, history of pulmonary embolism, stage IIIa chronic kidney disease, hypertension hyperlipidemia, history of prostate cancer status postresection and coronary artery disease presented from home with fall and loss of balance.  In the emergency room skeletal survey was negative.  Blood pressures were stable.  CK level was [phone].  Urinalysis was grossly abnormal.  Temperature was 100.5.  Admitted with UTI, rhabdomyolysis.  Noted to be confused at home.   Assessment & Plan:   Acute metabolic encephalopathy secondary to UTI Acute UTI present on admission, history of prostate cancer.  Mental status is improving.  Exam is nonfocal.  Supportive care.  PT OT. Continue Rocephin pending final cultures.  Prior history of Klebsiella.  Await final cultures.  We will check for any postvoid residual urine.  Patient has prostatectomy and was having dysuria for the last few days.  Traumatic rhabdomyolysis: Continue IV fluids.  Urine output is adequate.  Recheck CK levels tomorrow morning.  Statin continued due to normal LFTs.  Chronic medical issues including Essential hypertension, continue Coreg Hyperlipidemia, continue atorvastatin CKD stage IIIa, kidney functions at about baseline GERD, On PPI Neuropathy, on gabapentin History of pulmonary embolism, Eliquis on hold for upcoming surgery.   DVT prophylaxis: enoxaparin (LOVENOX) injection 40 mg Start: 11/11/22 1415   Code Status: Full code Family Communication: Wife at the bedside Disposition Plan: Status is: Observation The patient will require care spanning > 2 midnights and should be moved to inpatient because: IV antibiotics, IV fluids,     Consultants:  None  Procedures:  None  Antimicrobials:  Rocephin  11/13--   Subjective: Patient seen and examined.  Hard of hearing.  Feels weak but denies any complaints.  He worked with physical therapy and was very tremulous.  Wife arrived at the bedside.  She found him very weak and deconditioned.  Complains of occasional nausea.  Objective: Vitals:   11/11/22 2306 11/11/22 2317 11/12/22 0257 11/12/22 0700  BP: (!) 168/83  (!) 154/65 (!) 169/82  Pulse: 85  72 74  Resp: (!) '22  20 20  '$ Temp: 100.3 F (37.9 C)  98.9 F (37.2 C) 98.7 F (37.1 C)  TempSrc: Oral  Oral Oral  SpO2: 95%  98% 97%  Weight:  111 kg    Height:  '5\' 11"'$  (1.803 m)      Intake/Output Summary (Last 24 hours) at 11/12/2022 1121 Last data filed at 11/12/2022 0605 Gross per 24 hour  Intake 837.03 ml  Output 400 ml  Net 437.03 ml   Filed Weights   11/11/22 0540 11/11/22 2317  Weight: 108 kg 111 kg    Examination:  General exam: Appears calm and comfortable  Frail and debilitated.  Not in any distress.  On room air. Respiratory system: Clear to auscultation. Respiratory effort normal. Cardiovascular system: S1 & S2 heard,  Gastrointestinal system: Obese and pendulous.  Nontender. Central nervous system: Alert and oriented. No focal neurological deficits. Extremities: Symmetric 5 x 5 power. Skin: No rashes, lesions or ulcers Psychiatry: Judgement and insight appear normal. Mood & affect appropriate.     Data Reviewed: I have personally reviewed following labs and imaging studies  CBC: Recent Labs  Lab 11/11/22 0602 11/12/22 0040  WBC 11.3* 7.2  NEUTROABS 8.7*  --   HGB 14.8 14.5  HCT 45.3 44.7  MCV  87.3 88.5  PLT 181 867   Basic Metabolic Panel: Recent Labs  Lab 11/11/22 0602 11/12/22 0040  NA 140 137  K 3.9 3.5  CL 105 107  CO2 24 22  GLUCOSE 112* 106*  BUN 21 20  CREATININE 1.55* 1.31*  CALCIUM 8.8* 8.2*   GFR: Estimated Creatinine Clearance: 60.8 mL/min (A) (by C-G formula based on SCr of 1.31 mg/dL (H)). Liver Function Tests: Recent  Labs  Lab 11/11/22 0602  AST 14*  ALT 11  ALKPHOS 71  BILITOT 1.3*  PROT 6.4*  ALBUMIN 3.6   Recent Labs  Lab 11/11/22 0602  LIPASE 29   Recent Labs  Lab 11/11/22 0935  AMMONIA 27   Coagulation Profile: No results for input(s): "INR", "PROTIME" in the last 168 hours. Cardiac Enzymes: Recent Labs  Lab 11/11/22 0935 11/12/22 0040 11/12/22 0824  CKTOTAL 738* 1,820* 1,655*   BNP (last 3 results) Recent Labs    09/16/22 1205  PROBNP 81.0   HbA1C: No results for input(s): "HGBA1C" in the last 72 hours. CBG: No results for input(s): "GLUCAP" in the last 168 hours. Lipid Profile: No results for input(s): "CHOL", "HDL", "LDLCALC", "TRIG", "CHOLHDL", "LDLDIRECT" in the last 72 hours. Thyroid Function Tests: Recent Labs    11/11/22 0935  TSH 0.761   Anemia Panel: No results for input(s): "VITAMINB12", "FOLATE", "FERRITIN", "TIBC", "IRON", "RETICCTPCT" in the last 72 hours. Sepsis Labs: No results for input(s): "PROCALCITON", "LATICACIDVEN" in the last 168 hours.  Recent Results (from the past 240 hour(s))  Resp Panel by RT-PCR (Flu A&B, Covid) Anterior Nasal Swab     Status: None   Collection Time: 11/11/22  6:02 AM   Specimen: Anterior Nasal Swab  Result Value Ref Range Status   SARS Coronavirus 2 by RT PCR NEGATIVE NEGATIVE Final    Comment: (NOTE) SARS-CoV-2 target nucleic acids are NOT DETECTED.  The SARS-CoV-2 RNA is generally detectable in upper respiratory specimens during the acute phase of infection. The lowest concentration of SARS-CoV-2 viral copies this assay can detect is 138 copies/mL. A negative result does not preclude SARS-Cov-2 infection and should not be used as the sole basis for treatment or other patient management decisions. A negative result may occur with  improper specimen collection/handling, submission of specimen other than nasopharyngeal swab, presence of viral mutation(s) within the areas targeted by this assay, and inadequate  number of viral copies(<138 copies/mL). A negative result must be combined with clinical observations, patient history, and epidemiological information. The expected result is Negative.  Fact Sheet for Patients:  EntrepreneurPulse.com.au  Fact Sheet for Healthcare Providers:  IncredibleEmployment.be  This test is no t yet approved or cleared by the [place] FDA and  has been authorized for detection and/or diagnosis of SARS-CoV-2 by FDA under an Emergency Use Authorization (EUA). This EUA will remain  in effect (meaning this test can be used) for the duration of the COVID-19 declaration under Section 564(b)(1) of the Act, 21 U.S.C.section 360bbb-3(b)(1), unless the authorization is terminated  or revoked sooner.       Influenza A by PCR NEGATIVE NEGATIVE Final   Influenza B by PCR NEGATIVE NEGATIVE Final    Comment: (NOTE) The Xpert Xpress SARS-CoV-2/FLU/RSV plus assay is intended as an aid in the diagnosis of influenza from Nasopharyngeal swab specimens and should not be used as a sole basis for treatment. Nasal washings and aspirates are unacceptable for Xpert Xpress SARS-CoV-2/FLU/RSV testing.  Fact Sheet for Patients: EntrepreneurPulse.com.au  Fact Sheet for Healthcare Providers:  IncredibleEmployment.be  This test is not yet approved or cleared by the [place] and has been authorized for detection and/or diagnosis of SARS-CoV-2 by FDA under an Emergency Use Authorization (EUA). This EUA will remain in effect (meaning this test can be used) for the duration of the COVID-19 declaration under Section 564(b)(1) of the Act, 21 U.S.C. section 360bbb-3(b)(1), unless the authorization is terminated or revoked.  Performed at [facility], [address], [place], [place] [address]   Urine Culture     Status: Abnormal (Preliminary result)   Collection Time: 11/11/22  9:35  AM   Specimen: Urine, Clean Catch  Result Value Ref Range Status   Specimen Description   Final    URINE, CLEAN CATCH Performed at [facility], [address] [place], [place] [address]    Special Requests   Final    NONE Performed at [facility], [address], [place], [place] [address]    Culture >=100,000 COLONIES/mL GRAM NEGATIVE RODS (A)  Final   Report Status PENDING  Incomplete         Radiology Studies: CT Head Wo Contrast  Result Date: 11/11/2022 CLINICAL DATA:  Altered mental status and trauma EXAM: CT HEAD WITHOUT CONTRAST CT CERVICAL SPINE WITHOUT CONTRAST TECHNIQUE: Multidetector CT imaging of the head and cervical spine was performed following the standard protocol without intravenous contrast. Multiplanar CT image reconstructions of the cervical spine were also generated. RADIATION DOSE REDUCTION: This exam was performed according to the departmental dose-optimization program which includes automated exposure control, adjustment of the mA and/or kV according to patient size and/or use of iterative reconstruction technique. COMPARISON:  CT head and cervical spine 08/23/2021 FINDINGS: CT HEAD FINDINGS Brain: There is no acute intracranial hemorrhage, extra-axial fluid collection, or acute infarct Parenchymal volume is stable and within normal limits for age. The ventricles are stable in size. Gray-white differentiation is preserved. Mild chronic small-vessel ischemic change is stable. There is no mass lesion.  There is no mass effect or midline shift. Vascular: There is calcification of the bilateral carotid siphons. Skull: Normal. Negative for fracture or focal lesion. Sinuses/Orbits: There is extensive near-complete opacification of the paranasal sinuses, worsened since 2022. Bilateral lens implants are in place. The globes and orbits are otherwise unremarkable. Other: There is a small left mastoid effusion. The nasopharynx is grossly  unremarkable. CT CERVICAL SPINE FINDINGS Alignment: Normal. Skull base and vertebrae: Skull base alignment is maintained. Vertebral body heights are preserved. There is no evidence of acute fracture. There is no suspicious osseous lesion. Soft tissues and spinal canal: No prevertebral fluid or swelling. No visible canal hematoma. Disc levels: There is overall mild multilevel degenerative endplate change, disc space narrowing, and facet arthropathy. There is no evidence of high-grade spinal canal stenosis. Upper chest: The imaged lung apices are clear. Other: None. IMPRESSION: 1. No acute intracranial pathology. 2. No acute fracture or traumatic malalignment of the cervical spine. 3. Pansinusitis, worsened since 2022. Electronically Signed   By: Valetta Mole M.D.   On: 11/11/2022 09:07   CT Cervical Spine Wo Contrast  Result Date: 11/11/2022 CLINICAL DATA:  Altered mental status and trauma EXAM: CT HEAD WITHOUT CONTRAST CT CERVICAL SPINE WITHOUT CONTRAST TECHNIQUE: Multidetector CT imaging of the head and cervical spine was performed following the standard protocol without intravenous contrast. Multiplanar CT image reconstructions of the cervical spine were also generated. RADIATION DOSE REDUCTION: This exam was performed according to the departmental dose-optimization program which includes automated exposure  control, adjustment of the mA and/or kV according to patient size and/or use of iterative reconstruction technique. COMPARISON:  CT head and cervical spine 08/23/2021 FINDINGS: CT HEAD FINDINGS Brain: There is no acute intracranial hemorrhage, extra-axial fluid collection, or acute infarct Parenchymal volume is stable and within normal limits for age. The ventricles are stable in size. Gray-white differentiation is preserved. Mild chronic small-vessel ischemic change is stable. There is no mass lesion.  There is no mass effect or midline shift. Vascular: There is calcification of the bilateral carotid  siphons. Skull: Normal. Negative for fracture or focal lesion. Sinuses/Orbits: There is extensive near-complete opacification of the paranasal sinuses, worsened since 2022. Bilateral lens implants are in place. The globes and orbits are otherwise unremarkable. Other: There is a small left mastoid effusion. The nasopharynx is grossly unremarkable. CT CERVICAL SPINE FINDINGS Alignment: Normal. Skull base and vertebrae: Skull base alignment is maintained. Vertebral body heights are preserved. There is no evidence of acute fracture. There is no suspicious osseous lesion. Soft tissues and spinal canal: No prevertebral fluid or swelling. No visible canal hematoma. Disc levels: There is overall mild multilevel degenerative endplate change, disc space narrowing, and facet arthropathy. There is no evidence of high-grade spinal canal stenosis. Upper chest: The imaged lung apices are clear. Other: None. IMPRESSION: 1. No acute intracranial pathology. 2. No acute fracture or traumatic malalignment of the cervical spine. 3. Pansinusitis, worsened since 2022. Electronically Signed   By: Valetta Mole M.D.   On: 11/11/2022 09:07   DG Pelvis Portable  Result Date: 11/11/2022 CLINICAL DATA:  76 year old male with left hip and groin pain this morning. Previous right hip replacement. EXAM: PORTABLE PELVIS 1-2 VIEWS COMPARISON:  Pelvis radiographs 10/10/2021 and earlier. FINDINGS: Portable AP view at 0643 hours. Partially visible chronic right hip arthroplasty appears stable. Left femoral head is normally located. Left hip joint space appears stable since last year, within normal limits for age. Pelvis appears stable and intact. Chronic lumbosacral fusion and pelvic surgical clips. Nonobstructed visible bowel gas pattern. Grossly intact proximal left femur. No acute osseous abnormality identified. IMPRESSION: No acute osseous abnormality identified about the pelvis. If there is lateralizing hip pain then recommend dedicated hip  series. Electronically Signed   By: Genevie Ann M.D.   On: 11/11/2022 06:56        Scheduled Meds:  atorvastatin  20 mg Oral QHS   carvedilol  12.5 mg Oral BID   docusate sodium  100 mg Oral BID   enoxaparin (LOVENOX) injection  40 mg Subcutaneous Daily   gabapentin  300 mg Oral QHS   Continuous Infusions:  cefTRIAXone (ROCEPHIN)  IV     lactated ringers 75 mL/hr at 11/12/22 0306     LOS: 0 days    Time spent: 35 minutes    Barb Merino, MD [facility] Pager [phone]

## 2022-11-12 NOTE — TOC Initial Note (Addendum)
Transition of Care [facility]) - Initial/Assessment Note    Patient Details  Name: Bruce Ball MRN: [id number] Date of Birth: May 06, 1946  Transition of Care [facility]) CM/SW Contact:    Vassie Moselle, LCSW Phone Number: 11/12/2022, 1:05 PM  Clinical Narrative:                 Spoke with pt's spouse via t/c and confirmed plans for home health. She shares this pt has had home health services in the past. Initial she says he received home health services through [facility] and then said she thought they were through the [place]. She is agreeable for home health to be arranged. No DME needs identified.  HHPT/OT has been arranged with [facility]. Pt's spouse agreeable to this. Home health orders will need to be placed prior to discharge.   Expected Discharge Plan: [place] Barriers to Discharge: No Barriers Identified   Patient Goals and CMS Choice Patient states their goals for this hospitalization and ongoing recovery are:: To return home CMS Medicare.gov Compare Post Acute Care list provided to:: Patient Choice offered to / list presented to : Spouse  Expected Discharge Plan and Services Expected Discharge Plan: [place] In-house Referral: NA Discharge Planning Services: CM Consult Post Acute Care Choice: [place] arrangements for the past 2 months: Single Family Home                 DME Arranged: N/A DME Agency: NA       HH Arranged: PT, OT HH Agency: [place] Date [place]: 11/12/22 Time HH Agency Contacted: 1304 Representative spoke with at [place]: Malachy Mood  Prior Living Arrangements/Services Living arrangements for the past 2 months: [place] with:: Spouse Patient language and need for interpreter reviewed:: Yes Do you feel safe going back to the place where you live?: Yes      Need for Family Participation in Patient Care: Yes (Comment) Care giver support  system in place?: No (comment) Current home services: DME (Rollator, BSC, Shower bench, wheelchair) Criminal Activity/Legal Involvement Pertinent to Current Situation/Hospitalization: No - Comment as needed  Activities of Daily Living      Permission Sought/Granted Permission sought to share information with : Family Supports, Case Manager Permission granted to share information with : No              Emotional Assessment Appearance:: Appears stated age Attitude/Demeanor/Rapport: Unable to Assess Affect (typically observed): Unable to Assess Orientation: : Fluctuating Orientation (Suspected and/or reported [place]) Alcohol / Substance Use: Not Applicable Psych Involvement: No (comment)  Admission diagnosis:  Muscle soreness [M79.10] Elevated CK [R74.8] Acute cystitis without hematuria [N30.00] Altered mental status, unspecified altered mental status type [H67.59] Acute metabolic encephalopathy [F63.84] Patient Active Problem List   Diagnosis Date Noted   Acute metabolic encephalopathy 66/59/9357   UTI due to Klebsiella species 11/11/2022   Sinus disease 11/11/2022   Basal cell carcinoma of scalp 09/16/2022   Deposits (accretions) on teeth 09/16/2022   Dry eye syndrome of bilateral lacrimal glands 09/16/2022   Person encountering health services to consult on behalf of another person 09/16/2022   Encounter for issue of repeat prescription 09/16/2022   Encounter for fitting and adjustment of hearing aid 09/16/2022   Irreversible pulpitis 09/16/2022   Nontoxic single thyroid nodule 09/16/2022   Other chronic pain 09/16/2022   Pain in right hand 09/16/2022   Personal history of other venous thrombosis  and embolism 09/16/2022   Hypertension 09/16/2022   Thyroid nodule 09/16/2022   Sensorineural hearing loss, bilateral 09/16/2022   Post-COVID syndrome 09/16/2022   Chronic pansinusitis 07/11/2022   Nasal obstruction 07/11/2022   Nasal polyposis 06/11/2022    Non-seasonal allergic rhinitis 06/11/2022   Abrasion of right arm 11/19/2021   PUD (peptic ulcer disease) 10/26/2021   Femur fracture, right ([place]) 10/17/2021   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[place]) 10/16/2021   History of pulmonary embolism 10/10/2021   Chronic diastolic CHF (congestive heart failure) ([place]) 10/10/2021   CKD (chronic kidney disease), stage III ([place]) 10/10/2021   Hyperlipidemia 10/10/2021   Other fatigue 06/05/2021   CAD (coronary artery disease) 04/03/2021   SOB (shortness of breath) on exertion 03/26/2021   Chronic anticoagulation 03/26/2021   Anemia in chronic kidney disease 01/25/2021   Acquired coagulation disorder ([place]) 12/12/2020   Senile purpura ([place]) 12/12/2020   S/P lumbar fusion 08/03/2020   Thin skin 07/25/2020   Age-related osteoporosis without current pathological fracture 07/11/2020   Encounter for immunization 07/11/2020   Other specified counseling 07/11/2020   Malignant neoplasm of bone and articular cartilage ([place]) 07/11/2020   Malignant neoplasm of skin 07/11/2020   Meibomian gland dysfunction of right eye, unspecified eyelid 07/11/2020   Nuclear senile cataract 07/11/2020   Vitamin B12 deficient megaloblastic anemia 07/11/2020   Right lower quadrant pain 07/11/2020   Pyelonephritis 07/11/2020   Aortic atherosclerosis ([place]) 05/17/2020   B12 deficiency 05/17/2020   Iliac artery aneurysm ([place]) 05/17/2020   Osteoporosis 04/04/2020   Idiopathic neuropathy 01/28/2020   Recurrent falls 01/28/2020   GERD (gastroesophageal reflux disease) 12/22/2019   Essential tremor 12/22/2019   Kidney stone 11/05/2017   Right ureteral stone 11/05/2017   History of total right hip replacement 03/08/2017   History of prostate cancer 03/08/2017   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   Vertigo 03/01/2017   Right flank discomfort 03/01/2017   Prerenal renal failure 03/01/2017   Personal history of prostate cancer 02/28/2017   High risk medication use 02/28/2017   DDD  (degenerative disc disease), thoracic 02/28/2017   Syncope and collapse 02/28/2017   Ataxia 05/18/2016   Vestibular disequilibrium 05/18/2016   Failed spinal cord stimulator ([place]) 03/20/2016   Lung nodule, solitary 05/24/2014   Lumbosacral stenosis 06/05/2013   Malignant neoplasm of prostate ([place]) 06/05/2013   Peripheral edema 06/08/2012   Subcortical microvascular ischemic occlusive disease 08/13/2010   Backache 04/21/2008   History of peptic ulcer disease 04/21/2008   Essential hypertension 10/29/2007   PCP:  [name], MD Pharmacy:   [id number] [address], [place] [place] [address] Phone: [phone] Fax: [phone]  [id number] [id number]: [phone]: [address]Phone: [phone]: [phone]    Social Determinants of Health (SDOH) Interventions    Readmission Risk Interventions     No data to display

## 2022-11-12 NOTE — Evaluation (Signed)
Occupational Therapy Evaluation Patient Details Name: Bruce Ball MRN: [id number] DOB: 09-24-1946 Today's Date: 11/12/2022   History of Present Illness Pt is a 76 yo male admitted after a fall in the bathroom while changing depends. Xrays are negative. Pt with UTI and encephalopathy.  PMH: neuropathy, PE, CKD 3, HTN, PUD, sprostate CA, GERD, essential tremor   Clinical Impression   Pt was admitted with the above diagnosis and has the deficits listed below. Pt would benefit from cont OT to increase independence with basic adls back to mod I level which is baseline for this pt so he can return home with his wife. Pt states he could do all his adls prior to admission with the use of a walker.  Pt is currently confused, unable to read a clock, unable to find words, unsure of month or his home address.  Feel he will need mod assist with adls at this time to return home with wife and if she can handle this, HHOT is recommended. It assist at home is not available and pt does not progress, pt may need SNF.  Will continue to see with focus on cognition and adls.       Recommendations for follow up therapy are one component of a multi-disciplinary discharge planning process, led by the attending physician.  Recommendations may be updated based on patient status, additional functional criteria and insurance authorization.   Follow Up Recommendations  Home health OT     Assistance Recommended at Discharge Frequent or constant Supervision/Assistance  Patient can return home with the following A little help with walking and/or transfers;A little help with bathing/dressing/bathroom;Assistance with cooking/housework;Direct supervision/assist for medications management;Direct supervision/assist for financial management;Assist for transportation;Help with stairs or ramp for entrance    Functional Status Assessment  Patient has had a recent decline in their functional status and demonstrates the ability to  make significant improvements in function in a reasonable and predictable amount of time.  Equipment Recommendations  None recommended by OT (tbd unsure what pt has due to confusion)    Recommendations for Other Services Speech consult     Precautions / Restrictions Precautions Precautions: Fall Precaution Comments: Unabel to state how often he falls but did refer to big fall where he [facility] and had rehab. Restrictions Weight Bearing Restrictions: No      Mobility Bed Mobility Overal bed mobility: Needs Assistance Bed Mobility: Supine to Sit     Supine to sit: Mod assist     General bed mobility comments: Pt required step by step cues to get to EOB.    Transfers Overall transfer level: Needs assistance Equipment used: Rolling walker (2 wheels) Transfers: Sit to/from Stand, Bed to chair/wheelchair/BSC Sit to Stand: Min assist, From elevated surface     Step pivot transfers: Min assist     General transfer comment: Cues for hand placement and initiation.      Balance Overall balance assessment: Needs assistance Sitting-balance support: Feet supported, Bilateral upper extremity supported Sitting balance-Leahy Scale: Fair     Standing balance support: Bilateral upper extremity supported, During functional activity, Reliant on assistive device for balance Standing balance-Leahy Scale: Poor Standing balance comment: must have outside support to stand. Uses walker at baseline                           ADL either performed or assessed with clinical judgement   ADL Overall ADL's : Needs assistance/impaired Eating/Feeding: Set  up;Sitting   Grooming: Wash/dry hands;Wash/dry face;Oral care;Minimal assistance;Sitting Grooming Details (indicate cue type and reason): Pt very slow to process and to problem solve how to do the task sitting in chair. Upper Body Bathing: Set up;Sitting   Lower Body Bathing: Moderate assistance;Sit to/from  stand;Cueing for compensatory techniques   Upper Body Dressing : Minimal assistance;Sitting   Lower Body Dressing: Maximal assistance;Sit to/from stand;Cueing for compensatory techniques Lower Body Dressing Details (indicate cue type and reason): Pt in pain in stomach and groin area.  Moving very slowly and grimacing during all tasks. Toilet Transfer: Minimal assistance;Stand-pivot;Rolling walker (2 wheels);BSC/3in1   Toileting- Clothing Manipulation and Hygiene: Moderate assistance;Sit to/from stand;Cueing for compensatory techniques       Functional mobility during ADLs: Minimal assistance;Rolling walker (2 wheels) General ADL Comments: Pt limited with adls due to safety, balance and cognition.     Vision Baseline Vision/History: 1 Wears glasses Ability to See in Adequate Light: 0 Adequate Patient Visual Report: No change from baseline Vision Assessment?: No apparent visual deficits Additional Comments: Pt unable to read the clock but feel that was cognitive not visually related.     Perception Perception Perception Tested?: No   Praxis Praxis Praxis tested?: Not tested    Pertinent Vitals/Pain Pain Assessment Pain Assessment: Faces Faces Pain Scale: Hurts whole lot Pain Location: Pt had difficult time expressing where he has pain.  Alluded to stomach pain and L groin pain.  Grimacing a lot Pain Descriptors / Indicators: Aching, Grimacing, Guarding Pain Intervention(s): Limited activity within patient's tolerance, Monitored during session, Repositioned, Relaxation     Hand Dominance Right   Extremity/Trunk Assessment Upper Extremity Assessment Upper Extremity Assessment: RUE deficits/detail;LUE deficits/detail RUE Deficits / Details: PROM WFL Strength:  shoulder 3/5, biceps/triceps 4/5, grip 3+/5. Pt with essential tremor RUE Sensation: history of peripheral neuropathy RUE Coordination: decreased fine motor;decreased gross motor LUE Deficits / Details: PROM: 130  flextion then pain. Strength:  shoulder 3/5, biceps/triceps 4/5, grip 3+/5. essential tremor LUE: Shoulder pain with ROM LUE Sensation: history of peripheral neuropathy LUE Coordination: decreased fine motor;decreased gross motor   Lower Extremity Assessment Lower Extremity Assessment: Defer to PT evaluation   Cervical / Trunk Assessment Cervical / Trunk Assessment: Normal   Communication Communication Communication: Expressive difficulties   Cognition Arousal/Alertness: Awake/alert Behavior During Therapy: Flat affect Overall Cognitive Status: Impaired/Different from baseline Area of Impairment: Orientation, Attention, Memory, Safety/judgement, Following commands, Awareness, Problem solving                 Orientation Level: Place, Time, Situation Current Attention Level: Focused Memory: Decreased recall of precautions, Decreased short-term memory Following Commands: Follows one step commands inconsistently Safety/Judgement: Decreased awareness of safety, Decreased awareness of deficits Awareness: Intellectual Problem Solving: Slow processing, Decreased initiation, Requires verbal cues, Requires tactile cues General Comments: Pt very slow to answer all questions, not oriented and mildly confused.     General Comments  Pt with word finding problems, decreased processing time and motor planning deficts affecting all adls and mobility. Pt is a fall risk.    Exercises     Shoulder Instructions      Home Living Family/patient expects to be discharged to:: Private residence Living Arrangements: Spouse/significant other Available Help at Discharge: Family;Available 24 hours/day Type of Home: House Home Access: Stairs to enter [employer] of Steps: 1   Home Layout: One level     Bathroom Shower/Tub: Walk-in shower;Door   [employer] Toilet: Standard     Home Equipment: Conservation officer, nature (2  wheels)   Additional Comments: Pt poor historian. Unsure of equipment  he has at home and unsure home set up is correct at this point.      Prior Functioning/Environment Prior Level of Function : Independent/Modified Independent             Mobility Comments: uses walker at all times ADLs Comments: pt states he was independent with all adls using walker. Pt states he drives        OT Problem List: Decreased strength;Decreased range of motion;Decreased activity tolerance;Impaired balance (sitting and/or standing);Decreased coordination;Decreased cognition;Decreased safety awareness;Decreased knowledge of use of DME or AE;Decreased knowledge of precautions;Impaired sensation;Impaired UE functional use;Pain      OT Treatment/Interventions: Self-care/ADL training;DME and/or AE instruction;Therapeutic activities;Patient/family education    OT Goals(Current goals can be found in the care plan section) Acute Rehab OT Goals Patient Stated Goal: to get surgery tomorrow OT Goal Formulation: With patient Time For Goal Achievement: 11/26/22 Potential to Achieve Goals: Good ADL Goals Pt Will Perform Eating: with supervision;sitting Pt Will Perform Grooming: with supervision;standing Pt Will Perform Lower Body Dressing: with supervision;sit to/from stand Pt Will Perform Tub/Shower Transfer: Shower transfer;with min guard assist;rolling walker;3 in 1 Additional ADL Goal #1: Pt will walk to bathroom and complete toileting with supervision and walker. Additional ADL Goal #2: Pt will answer basic orientation questions with 100% accuracy.  OT Frequency: Min 2X/week    Co-evaluation              AM-PAC OT "6 Clicks" Daily Activity     Outcome Measure Help from another person eating meals?: None Help from another person taking care of personal grooming?: A Little Help from another person toileting, which includes using toliet, bedpan, or urinal?: A Lot Help from another person bathing (including washing, rinsing, drying)?: A Lot Help from another person to  put on and taking off regular upper body clothing?: A Little Help from another person to put on and taking off regular lower body clothing?: A Lot 6 Click Score: 16   End of Session Equipment Utilized During Treatment: Rolling walker (2 wheels) Nurse Communication: Mobility status  Activity Tolerance: Patient limited by fatigue Patient left: in chair;with call bell/phone within reach;with chair alarm set  OT Visit Diagnosis: Unsteadiness on feet (R26.81);Other symptoms and signs involving cognitive function                Time: 4158-3094 OT Time Calculation (min): 27 min Charges:  OT General Charges $OT Visit: 1 Visit OT Evaluation $OT Eval Moderate Complexity: 1 Mod OT Treatments $Self Care/Home Management : 8-22 mins  Glenford Peers 11/12/2022, 9:27 AM

## 2022-11-13 ENCOUNTER — Encounter (HOSPITAL_COMMUNITY): Payer: Self-pay | Admitting: Internal Medicine

## 2022-11-13 ENCOUNTER — Other Ambulatory Visit: Payer: Self-pay

## 2022-11-13 ENCOUNTER — Other Ambulatory Visit (HOSPITAL_COMMUNITY): Payer: Self-pay

## 2022-11-13 DIAGNOSIS — G9341 Metabolic encephalopathy: Secondary | ICD-10-CM | POA: Diagnosis not present

## 2022-11-13 LAB — BASIC METABOLIC PANEL
Anion gap: 7 (ref 5–15)
BUN: 19 mg/dL (ref 8–23)
CO2: 25 mmol/L (ref 22–32)
Calcium: 8 mg/dL — ABNORMAL LOW (ref 8.9–10.3)
Chloride: 108 mmol/L (ref 98–111)
Creatinine, Ser: 1.28 mg/dL — ABNORMAL HIGH (ref 0.61–1.24)
GFR, Estimated: 58 mL/min — ABNORMAL LOW (ref >60)
Glucose, Bld: 93 mg/dL (ref 70–99)
Potassium: 3.5 mmol/L (ref 3.5–5.1)
Sodium: 140 mmol/L (ref 135–145)

## 2022-11-13 LAB — URINE CULTURE: Culture: >=100000 — AB

## 2022-11-13 LAB — CK: Total CK: 793 U/L — ABNORMAL HIGH (ref 49–397)

## 2022-11-13 LAB — MAGNESIUM: Magnesium: 2.1 mg/dL (ref 1.7–2.4)

## 2022-11-13 MED ORDER — PANTOPRAZOLE SODIUM 40 MG PO TBEC
40.0000 mg | DELAYED_RELEASE_TABLET | Freq: Every day | ORAL | Status: AC
  Administered 2022-11-13: 40 mg via ORAL
  Filled 2022-11-13: qty 1

## 2022-11-13 MED ORDER — CEFADROXIL 500 MG PO CAPS
1.0000 g | ORAL_CAPSULE | Freq: Two times a day (BID) | ORAL | 0 refills | Status: AC
  Filled 2022-11-13: qty 28, 7d supply, fill #0

## 2022-11-13 MED ORDER — CEFADROXIL 1 G PO TABS: 1.0000 g | ORAL_TABLET | Freq: Two times a day (BID) | ORAL | 0 refills | Status: DC

## 2022-11-13 MED ORDER — SODIUM CHLORIDE 0.9 % IV SOLN
1.0000 g | Freq: Once | INTRAVENOUS | Status: AC
  Administered 2022-11-13: 1 g via INTRAVENOUS
  Filled 2022-11-13: qty 10

## 2022-11-13 NOTE — Discharge Summary (Signed)
Physician Discharge Summary  Bruce Ball PXT:[id number] DOB: Oct 04, 1946 DOA: 11/11/2022  PCP: [name], MD  Admit date: 11/11/2022 Discharge date: 11/13/2022  Admitted From: Home Disposition: Home  Recommendations for Outpatient Follow-up:  Follow up with PCP in 1-2 weeks Please obtain BMP/CBC in one week Call and schedule follow-up with urology  Home Health: PT/OT Equipment/Devices: None  Discharge Condition: Stable CODE STATUS: Full code Diet recommendation: Low-salt diet  Discharge summary: 76 year old gentleman with history of chronic sinus issues, neuropathy, history of pulmonary embolism, stage IIIa chronic kidney disease, hypertension , hyperlipidemia, history of prostate cancer status postresection and coronary artery disease presented from home with fall and loss of balance.  In the emergency room skeletal survey was negative.  Blood pressures were stable.  CK level was [phone].  Urinalysis was grossly abnormal.  Temperature was 100.5.  Admitted with UTI, rhabdomyolysis.  Noted to be confused at home.  Patient also had recent Klebsiella UTI treated with Keflex.     Assessment & Plan:   Acute metabolic encephalopathy secondary to UTI, mental status normalized. Acute UTI present on admission, history of prostate cancer.   Mental status is improving.  Exam is nonfocal.  Supportive care.  PT OT at home Urine culture with Klebsiella, similar to previous urine culture.  Pansensitive to cephalexin's. Rocephin day 3 today.  Will discharge with cefadroxil for additional 7 days to treat as complicated UTI.  He will follow-up with urology as scheduled.   Traumatic rhabdomyolysis: Improving.  Adequate urine output.  Continue oral hydration at home.  LFTs improving, can resume statin.    Chronic medical issues including Essential hypertension, continue Coreg Hyperlipidemia, continue atorvastatin CKD stage IIIa, kidney functions at about baseline GERD, On  PPI Neuropathy, on gabapentin History of pulmonary embolism, Eliquis was on hold for upcoming surgery.  Can restart taking Eliquis as his surgery is rescheduled and follow-up surgical instructions.   Patient is adequately stabilized.  Can go home with home health PT OT.  Has adequate support system at home.    Discharge Diagnoses:  Principal Problem:   Acute metabolic encephalopathy Active Problems:   UTI due to Klebsiella species   Sinus disease   Essential hypertension   GERD (gastroesophageal reflux disease)   Essential tremor   Idiopathic neuropathy   CAD (coronary artery disease)   History of pulmonary embolism   Chronic diastolic CHF (congestive heart failure) (HCC)   CKD (chronic kidney disease), stage III (HCC)   Hyperlipidemia   Hypertension   PUD (peptic ulcer disease)   UTI (urinary tract infection)    Discharge Instructions  Discharge Instructions     Diet - low sodium heart healthy   Complete by: As directed    Increase activity slowly   Complete by: As directed       Allergies as of 11/13/2022       Reactions   Tape Other (See Comments)   NEEDS COBAN WRAP- NO TAPE!!!!- SKIN TEARS VERY EASILY!!!!   Lisinopril Other (See Comments)   Lowered the blood pressure TOO MUCH!!        Medication List     TAKE these medications    acetaminophen 500 MG tablet Commonly known as: TYLENOL Take 500-1,000 mg by mouth every 6 (six) hours as needed for mild pain or headache.   apixaban 2.5 MG Tabs tablet Commonly known as: Eliquis Take 1 tablet (2.5 mg total) by mouth 2 (two) times daily.   atorvastatin 20 MG tablet Commonly known as: LIPITOR  Take 1 tablet (20 mg total) by mouth at bedtime.   carvedilol 25 MG tablet Commonly known as: COREG Take 25 mg by mouth 2 (two) times daily.   cefadroxil 1 g tablet Commonly known as: DURICEF Take 1 tablet (1 g total) by mouth 2 (two) times daily for 7 days.   cetirizine 10 MG tablet Commonly known as:  ZYRTEC Take 10 mg by mouth daily as needed for allergies or rhinitis.   cyclobenzaprine 10 MG tablet Commonly known as: FLEXERIL Take 5 mg by mouth at bedtime as needed (for muscle pain- causes "hangover," so do not drive).   ferrous sulfate 325 (65 FE) MG tablet Take 325 mg by mouth every Monday, Wednesday, and Friday.   fluticasone 50 MCG/ACT nasal spray Commonly known as: FLONASE Place 1-2 sprays into both nostrils daily as needed for allergies or rhinitis.   furosemide 20 MG tablet Commonly known as: LASIX Take 20 mg by mouth daily as needed (if the feet and/or legs swell).   gabapentin 300 MG capsule Commonly known as: NEURONTIN Take 300 mg by mouth at bedtime as needed (FOR NERVE PAIN).   pantoprazole 40 MG tablet Commonly known as: PROTONIX Take 40 mg by mouth 2 (two) times daily before a meal.   POTASSIUM PO Take 1 tablet by mouth See admin instructions. Take 1 tablet by mouth once a day ONLY WHEN TAKING FUROSEMIDE   tiZANidine 4 MG tablet Commonly known as: ZANAFLEX Take 4 mg by mouth every 6 (six) hours as needed for muscle spasms.   traMADol 50 MG tablet Commonly known as: ULTRAM Take 50 mg by mouth every 6 (six) hours as needed for moderate pain.   Vitamin D3 50 MCG (2000 UT) capsule Take 2,000 Units by mouth in the morning and at bedtime.        Follow-up [facility] Follow up.   Why: [facility] will follow up with you at discharge to provide home health PT/OT. Contact information: [address] [place] [address] [phone]                Allergies  Allergen Reactions   Tape Other (See Comments)    NEEDS COBAN WRAP- NO TAPE!!!!- SKIN TEARS VERY EASILY!!!!   Lisinopril Other (See Comments)    Lowered the blood pressure TOO MUCH!!    Consultations: None   Procedures/Studies: CT Head Wo Contrast  Result Date: 11/11/2022 CLINICAL DATA:  Altered mental status and trauma EXAM: CT HEAD WITHOUT CONTRAST CT CERVICAL  SPINE WITHOUT CONTRAST TECHNIQUE: Multidetector CT imaging of the head and cervical spine was performed following the standard protocol without intravenous contrast. Multiplanar CT image reconstructions of the cervical spine were also generated. RADIATION DOSE REDUCTION: This exam was performed according to the departmental dose-optimization program which includes automated exposure control, adjustment of the mA and/or kV according to patient size and/or use of iterative reconstruction technique. COMPARISON:  CT head and cervical spine 08/23/2021 FINDINGS: CT HEAD FINDINGS Brain: There is no acute intracranial hemorrhage, extra-axial fluid collection, or acute infarct Parenchymal volume is stable and within normal limits for age. The ventricles are stable in size. Gray-white differentiation is preserved. Mild chronic small-vessel ischemic change is stable. There is no mass lesion.  There is no mass effect or midline shift. Vascular: There is calcification of the bilateral carotid siphons. Skull: Normal. Negative for fracture or focal lesion. Sinuses/Orbits: There is extensive near-complete opacification of the paranasal sinuses, worsened since 2022. Bilateral lens implants  are in place. The globes and orbits are otherwise unremarkable. Other: There is a small left mastoid effusion. The nasopharynx is grossly unremarkable. CT CERVICAL SPINE FINDINGS Alignment: Normal. Skull base and vertebrae: Skull base alignment is maintained. Vertebral body heights are preserved. There is no evidence of acute fracture. There is no suspicious osseous lesion. Soft tissues and spinal canal: No prevertebral fluid or swelling. No visible canal hematoma. Disc levels: There is overall mild multilevel degenerative endplate change, disc space narrowing, and facet arthropathy. There is no evidence of high-grade spinal canal stenosis. Upper chest: The imaged lung apices are clear. Other: None. IMPRESSION: 1. No acute intracranial pathology.  2. No acute fracture or traumatic malalignment of the cervical spine. 3. Pansinusitis, worsened since 2022. Electronically Signed   By: Valetta Mole M.D.   On: 11/11/2022 09:07   CT Cervical Spine Wo Contrast  Result Date: 11/11/2022 CLINICAL DATA:  Altered mental status and trauma EXAM: CT HEAD WITHOUT CONTRAST CT CERVICAL SPINE WITHOUT CONTRAST TECHNIQUE: Multidetector CT imaging of the head and cervical spine was performed following the standard protocol without intravenous contrast. Multiplanar CT image reconstructions of the cervical spine were also generated. RADIATION DOSE REDUCTION: This exam was performed according to the departmental dose-optimization program which includes automated exposure control, adjustment of the mA and/or kV according to patient size and/or use of iterative reconstruction technique. COMPARISON:  CT head and cervical spine 08/23/2021 FINDINGS: CT HEAD FINDINGS Brain: There is no acute intracranial hemorrhage, extra-axial fluid collection, or acute infarct Parenchymal volume is stable and within normal limits for age. The ventricles are stable in size. Gray-white differentiation is preserved. Mild chronic small-vessel ischemic change is stable. There is no mass lesion.  There is no mass effect or midline shift. Vascular: There is calcification of the bilateral carotid siphons. Skull: Normal. Negative for fracture or focal lesion. Sinuses/Orbits: There is extensive near-complete opacification of the paranasal sinuses, worsened since 2022. Bilateral lens implants are in place. The globes and orbits are otherwise unremarkable. Other: There is a small left mastoid effusion. The nasopharynx is grossly unremarkable. CT CERVICAL SPINE FINDINGS Alignment: Normal. Skull base and vertebrae: Skull base alignment is maintained. Vertebral body heights are preserved. There is no evidence of acute fracture. There is no suspicious osseous lesion. Soft tissues and spinal canal: No prevertebral  fluid or swelling. No visible canal hematoma. Disc levels: There is overall mild multilevel degenerative endplate change, disc space narrowing, and facet arthropathy. There is no evidence of high-grade spinal canal stenosis. Upper chest: The imaged lung apices are clear. Other: None. IMPRESSION: 1. No acute intracranial pathology. 2. No acute fracture or traumatic malalignment of the cervical spine. 3. Pansinusitis, worsened since 2022. Electronically Signed   By: Valetta Mole M.D.   On: 11/11/2022 09:07   DG Pelvis Portable  Result Date: 11/11/2022 CLINICAL DATA:  76 year old male with left hip and groin pain this morning. Previous right hip replacement. EXAM: PORTABLE PELVIS 1-2 VIEWS COMPARISON:  Pelvis radiographs 10/10/2021 and earlier. FINDINGS: Portable AP view at 0643 hours. Partially visible chronic right hip arthroplasty appears stable. Left femoral head is normally located. Left hip joint space appears stable since last year, within normal limits for age. Pelvis appears stable and intact. Chronic lumbosacral fusion and pelvic surgical clips. Nonobstructed visible bowel gas pattern. Grossly intact proximal left femur. No acute osseous abnormality identified. IMPRESSION: No acute osseous abnormality identified about the pelvis. If there is lateralizing hip pain then recommend dedicated hip series. Electronically Signed  By: Genevie Ann M.D.   On: 11/11/2022 06:56   (Echo, Carotid, EGD, Colonoscopy, ERCP)    Subjective: Patient seen in the morning rounds.  Denies any overnight events.   Discharge Exam: Vitals:   11/12/22 2012 11/13/22 0532  BP: (!) 178/72 (!) 156/73  Pulse: 65 70  Resp: 20 18  Temp: 98.1 F (36.7 C) 99.2 F (37.3 C)  SpO2: 98% 95%   Vitals:   11/12/22 0700 11/12/22 1512 11/12/22 2012 11/13/22 0532  BP: (!) 169/82 (!) 157/76 (!) 178/72 (!) 156/73  Pulse: 74 72 65 70  Resp: '20  20 18  '$ Temp: 98.7 F (37.1 C) 98.7 F (37.1 C) 98.1 F (36.7 C) 99.2 F (37.3 C)   TempSrc: Oral  Oral Oral  SpO2: 97% 99% 98% 95%  Weight:      Height:        General: Pt is alert, awake, not in acute distress Cardiovascular: RRR, S1/S2 +, no rubs, no gallops Respiratory: CTA bilaterally, no wheezing, no rhonchi Abdominal: Soft, NT, ND, bowel sounds + Extremities: no edema, no cyanosis    The results of significant diagnostics from this hospitalization (including imaging, microbiology, ancillary and laboratory) are listed below for reference.     Microbiology: Recent Results (from the past 240 hour(s))  Resp Panel by RT-PCR (Flu A&B, Covid) Anterior Nasal Swab     Status: None   Collection Time: 11/11/22  6:02 AM   Specimen: Anterior Nasal Swab  Result Value Ref Range Status   SARS Coronavirus 2 by RT PCR NEGATIVE NEGATIVE Final    Comment: (NOTE) SARS-CoV-2 target nucleic acids are NOT DETECTED.  The SARS-CoV-2 RNA is generally detectable in upper respiratory specimens during the acute phase of infection. The lowest concentration of SARS-CoV-2 viral copies this assay can detect is 138 copies/mL. A negative result does not preclude SARS-Cov-2 infection and should not be used as the sole basis for treatment or other patient management decisions. A negative result may occur with  improper specimen collection/handling, submission of specimen other than nasopharyngeal swab, presence of viral mutation(s) within the areas targeted by this assay, and inadequate number of viral copies(<138 copies/mL). A negative result must be combined with clinical observations, patient history, and epidemiological information. The expected result is Negative.  Fact Sheet for Patients:  EntrepreneurPulse.com.au  Fact Sheet for Healthcare Providers:  IncredibleEmployment.be  This test is no t yet approved or cleared by the [place] FDA and  has been authorized for detection and/or diagnosis of SARS-CoV-2 by FDA under an Emergency  Use Authorization (EUA). This EUA will remain  in effect (meaning this test can be used) for the duration of the COVID-19 declaration under Section 564(b)(1) of the Act, 21 U.S.C.section 360bbb-3(b)(1), unless the authorization is terminated  or revoked sooner.       Influenza A by PCR NEGATIVE NEGATIVE Final   Influenza B by PCR NEGATIVE NEGATIVE Final    Comment: (NOTE) The Xpert Xpress SARS-CoV-2/FLU/RSV plus assay is intended as an aid in the diagnosis of influenza from Nasopharyngeal swab specimens and should not be used as a sole basis for treatment. Nasal washings and aspirates are unacceptable for Xpert Xpress SARS-CoV-2/FLU/RSV testing.  Fact Sheet for Patients: EntrepreneurPulse.com.au  Fact Sheet for Healthcare Providers: IncredibleEmployment.be  This test is not yet approved or cleared by the [place] FDA and has been authorized for detection and/or diagnosis of SARS-CoV-2 by FDA under an Emergency Use Authorization (EUA). This EUA will remain in effect (meaning  this test can be used) for the duration of the COVID-19 declaration under Section 564(b)(1) of the Act, 21 U.S.C. section 360bbb-3(b)(1), unless the authorization is terminated or revoked.  Performed at [facility], [address], [place], [place] [address]   Urine Culture     Status: Abnormal   Collection Time: 11/11/22  9:35 AM   Specimen: Urine, Clean Catch  Result Value Ref Range Status   Specimen Description   Final    URINE, CLEAN CATCH Performed at [facility], [address], [place], [place] [address]    Special Requests   Final    NONE Performed at [facility], [address] [place], [place] [address]    Culture >=100,000 COLONIES/mL KLEBSIELLA PNEUMONIAE (A)  Final   Report Status 11/13/2022 FINAL  Final   Organism ID, Bacteria KLEBSIELLA PNEUMONIAE (A)  Final      Susceptibility    Klebsiella pneumoniae - MIC*    AMPICILLIN RESISTANT Resistant     CEFAZOLIN <=4 SENSITIVE Sensitive     CEFEPIME <=0.12 SENSITIVE Sensitive     CEFTRIAXONE <=0.25 SENSITIVE Sensitive     CIPROFLOXACIN <=0.25 SENSITIVE Sensitive     GENTAMICIN <=1 SENSITIVE Sensitive     IMIPENEM <=0.25 SENSITIVE Sensitive     NITROFURANTOIN <=16 SENSITIVE Sensitive     TRIMETH/SULFA <=20 SENSITIVE Sensitive     AMPICILLIN/SULBACTAM 4 SENSITIVE Sensitive     PIP/TAZO <=4 SENSITIVE Sensitive     * >=100,000 COLONIES/mL KLEBSIELLA PNEUMONIAE     Labs: BNP (last 3 results) Recent Labs    12/23/21 1121  BNP 36.6   Basic Metabolic Panel: Recent Labs  Lab 11/11/22 0602 11/12/22 0040 11/13/22 0443  NA 140 137 140  K 3.9 3.5 3.5  CL 105 107 108  CO2 '24 22 25  '$ GLUCOSE 112* 106* 93  BUN '21 20 19  '$ CREATININE 1.55* 1.31* 1.28*  CALCIUM 8.8* 8.2* 8.0*  MG  --   --  2.1   Liver Function Tests: Recent Labs  Lab 11/11/22 0602  AST 14*  ALT 11  ALKPHOS 71  BILITOT 1.3*  PROT 6.4*  ALBUMIN 3.6   Recent Labs  Lab 11/11/22 0602  LIPASE 29   Recent Labs  Lab 11/11/22 0935  AMMONIA 27   CBC: Recent Labs  Lab 11/11/22 0602 11/12/22 0040  WBC 11.3* 7.2  NEUTROABS 8.7*  --   HGB 14.8 14.5  HCT 45.3 44.7  MCV 87.3 88.5  PLT 181 164   Cardiac Enzymes: Recent Labs  Lab 11/11/22 0935 11/12/22 0040 11/12/22 0824 11/13/22 0443  CKTOTAL 738* 1,820* 1,655* 793*   BNP: Invalid input(s): "POCBNP" CBG: No results for input(s): "GLUCAP" in the last 168 hours. D-Dimer No results for input(s): "DDIMER" in the last 72 hours. Hgb A1c No results for input(s): "HGBA1C" in the last 72 hours. Lipid Profile No results for input(s): "CHOL", "HDL", "LDLCALC", "TRIG", "CHOLHDL", "LDLDIRECT" in the last 72 hours. Thyroid function studies Recent Labs    11/11/22 0935  TSH 0.761   Anemia work up No results for input(s): "VITAMINB12", "FOLATE", "FERRITIN", "TIBC", "IRON", "RETICCTPCT" in  the last 72 hours. Urinalysis    Component Value Date/Time   COLORURINE YELLOW 11/11/2022 0602   APPEARANCEUR HAZY (A) 11/11/2022 0602   LABSPEC 1.014 11/11/2022 0602   PHURINE 6.0 11/11/2022 0602   GLUCOSEU NEGATIVE 11/11/2022 0602   HGBUR LARGE (A) 11/11/2022 0602   HGBUR large 02/26/2010 1214   BILIRUBINUR NEGATIVE 11/11/2022 0602  BILIRUBINUR 1+ 06/26/2016 1117   KETONESUR NEGATIVE 11/11/2022 0602   PROTEINUR TRACE (A) 11/11/2022 0602   UROBILINOGEN 1.0 06/26/2016 1117   UROBILINOGEN 1.0 03/05/2010 2252   NITRITE POSITIVE (A) 11/11/2022 0602   LEUKOCYTESUR LARGE (A) 11/11/2022 0602   Sepsis Labs Recent Labs  Lab 11/11/22 0602 11/12/22 0040  WBC 11.3* 7.2   Microbiology Recent Results (from the past 240 hour(s))  Resp Panel by RT-PCR (Flu A&B, Covid) Anterior Nasal Swab     Status: None   Collection Time: 11/11/22  6:02 AM   Specimen: Anterior Nasal Swab  Result Value Ref Range Status   SARS Coronavirus 2 by RT PCR NEGATIVE NEGATIVE Final    Comment: (NOTE) SARS-CoV-2 target nucleic acids are NOT DETECTED.  The SARS-CoV-2 RNA is generally detectable in upper respiratory specimens during the acute phase of infection. The lowest concentration of SARS-CoV-2 viral copies this assay can detect is 138 copies/mL. A negative result does not preclude SARS-Cov-2 infection and should not be used as the sole basis for treatment or other patient management decisions. A negative result may occur with  improper specimen collection/handling, submission of specimen other than nasopharyngeal swab, presence of viral mutation(s) within the areas targeted by this assay, and inadequate number of viral copies(<138 copies/mL). A negative result must be combined with clinical observations, patient history, and epidemiological information. The expected result is Negative.  Fact Sheet for Patients:  EntrepreneurPulse.com.au  Fact Sheet for Healthcare Providers:   IncredibleEmployment.be  This test is no t yet approved or cleared by the [place] FDA and  has been authorized for detection and/or diagnosis of SARS-CoV-2 by FDA under an Emergency Use Authorization (EUA). This EUA will remain  in effect (meaning this test can be used) for the duration of the COVID-19 declaration under Section 564(b)(1) of the Act, 21 U.S.C.section 360bbb-3(b)(1), unless the authorization is terminated  or revoked sooner.       Influenza A by PCR NEGATIVE NEGATIVE Final   Influenza B by PCR NEGATIVE NEGATIVE Final    Comment: (NOTE) The Xpert Xpress SARS-CoV-2/FLU/RSV plus assay is intended as an aid in the diagnosis of influenza from Nasopharyngeal swab specimens and should not be used as a sole basis for treatment. Nasal washings and aspirates are unacceptable for Xpert Xpress SARS-CoV-2/FLU/RSV testing.  Fact Sheet for Patients: EntrepreneurPulse.com.au  Fact Sheet for Healthcare Providers: IncredibleEmployment.be  This test is not yet approved or cleared by the [place] FDA and has been authorized for detection and/or diagnosis of SARS-CoV-2 by FDA under an Emergency Use Authorization (EUA). This EUA will remain in effect (meaning this test can be used) for the duration of the COVID-19 declaration under Section 564(b)(1) of the Act, 21 U.S.C. section 360bbb-3(b)(1), unless the authorization is terminated or revoked.  Performed at [facility], [address], [place], [place] [address]   Urine Culture     Status: Abnormal   Collection Time: 11/11/22  9:35 AM   Specimen: Urine, Clean Catch  Result Value Ref Range Status   Specimen Description   Final    URINE, CLEAN CATCH Performed at [facility], [address] [place], [place] [address]    Special Requests   Final    NONE Performed at [facility], [address],  [place],  [address]    Culture >=100,000 COLONIES/mL KLEBSIELLA PNEUMONIAE (A)  Final   Report Status 11/13/2022 FINAL  Final   Organism ID, Bacteria KLEBSIELLA PNEUMONIAE (A)  Final  Susceptibility   Klebsiella pneumoniae - MIC*    AMPICILLIN RESISTANT Resistant     CEFAZOLIN <=4 SENSITIVE Sensitive     CEFEPIME <=0.12 SENSITIVE Sensitive     CEFTRIAXONE <=0.25 SENSITIVE Sensitive     CIPROFLOXACIN <=0.25 SENSITIVE Sensitive     GENTAMICIN <=1 SENSITIVE Sensitive     IMIPENEM <=0.25 SENSITIVE Sensitive     NITROFURANTOIN <=16 SENSITIVE Sensitive     TRIMETH/SULFA <=20 SENSITIVE Sensitive     AMPICILLIN/SULBACTAM 4 SENSITIVE Sensitive     PIP/TAZO <=4 SENSITIVE Sensitive     * >=100,000 COLONIES/mL KLEBSIELLA PNEUMONIAE     Time coordinating discharge: 32 minutes  SIGNED:   Barb Merino, MD  [facility] 11/13/2022, 10:04 AM

## 2022-11-14 ENCOUNTER — Telehealth: Payer: Self-pay

## 2022-11-14 NOTE — Telephone Encounter (Signed)
Noted thanks °

## 2022-11-14 NOTE — Telephone Encounter (Signed)
Transition Care Management Follow-up Telephone Call Date of discharge and from where: [facility] 11/13/2022 How have you been since you were released from the hospital? Some better Any questions or concerns? No  Items Reviewed: Did the pt receive and understand the discharge instructions provided? Yes  Medications obtained and verified? Yes  Other? No  Any new allergies since your discharge? No  Dietary orders reviewed? Yes Do you have support at home? Yes   Home Care and Equipment/Supplies: Were home health services ordered? yes If so, what is the name of the agency? unknown  Has the agency set up a time to come to the patient's home? yes Were any new equipment or medical supplies ordered?  No What is the name of the medical supply agency? N/a Were you able to get the supplies/equipment? no Do you have any questions related to the use of the equipment or supplies? No  Functional Questionnaire: (I = Independent and D = Dependent) ADLs: I  Bathing/Dressing- I  Meal Prep- I  Eating- I  Maintaining continence- I  Transferring/Ambulation- I  Managing Meds- I  Follow up appointments reviewed:  PCP Hospital f/u appt confirmed? Yes  Scheduled to see Dr Bruce Ball on 11/27/2022 @ 8:20. [facility] Hospital f/u appt confirmed? No   Are transportation arrangements needed? No  If their condition worsens, is the pt aware to call PCP or go to the Emergency Dept.? Yes Was the patient provided with contact information for the PCP's office or ED? Yes Was to pt encouraged to call back with questions or concerns yes Bruce Ball, [place] Direct Dial [phone]

## 2022-11-17 LAB — CULTURE, BLOOD (ROUTINE X 2)
Culture: NO GROWTH
Culture: NO GROWTH
Special Requests: ADEQUATE
Special Requests: ADEQUATE

## 2022-11-18 ENCOUNTER — Ambulatory Visit
Admit: 2022-11-18 | Discharge: 2022-11-18 | Disposition: A | Discharge status: Home / Self Care | Payer: Medicare Other | Attending: Nephrology | Admitting: Nephrology

## 2022-11-18 DIAGNOSIS — R319 Hematuria, unspecified: Secondary | ICD-10-CM

## 2022-11-18 DIAGNOSIS — C61 Malignant neoplasm of prostate: Secondary | ICD-10-CM | POA: Diagnosis not present

## 2022-11-18 DIAGNOSIS — Z87442 Personal history of urinary calculi: Secondary | ICD-10-CM

## 2022-11-18 DIAGNOSIS — N1832 Chronic kidney disease, stage 3b: Secondary | ICD-10-CM

## 2022-11-27 ENCOUNTER — Telehealth: Payer: Self-pay | Admitting: Family Medicine

## 2022-11-27 ENCOUNTER — Ambulatory Visit (INDEPENDENT_AMBULATORY_CARE_PROVIDER_SITE_OTHER): Payer: Medicare Other | Admitting: Family Medicine

## 2022-11-27 ENCOUNTER — Encounter: Payer: Self-pay | Admitting: Family Medicine

## 2022-11-27 VITALS — BP 150/80 | HR 61 | Temp 97.4°F | Ht 71.0 in | Wt 236.2 lb

## 2022-11-27 DIAGNOSIS — D692 Other nonthrombocytopenic purpura: Secondary | ICD-10-CM

## 2022-11-27 DIAGNOSIS — I10 Essential (primary) hypertension: Secondary | ICD-10-CM | POA: Diagnosis not present

## 2022-11-27 DIAGNOSIS — N3001 Acute cystitis with hematuria: Secondary | ICD-10-CM | POA: Diagnosis not present

## 2022-11-27 DIAGNOSIS — J324 Chronic pansinusitis: Secondary | ICD-10-CM | POA: Diagnosis not present

## 2022-11-27 DIAGNOSIS — I2510 Atherosclerotic heart disease of native coronary artery without angina pectoris: Secondary | ICD-10-CM

## 2022-11-27 DIAGNOSIS — M6282 Rhabdomyolysis: Secondary | ICD-10-CM | POA: Diagnosis not present

## 2022-11-27 DIAGNOSIS — I2584 Coronary atherosclerosis due to calcified coronary lesion: Secondary | ICD-10-CM

## 2022-11-27 LAB — CBC WITH DIFFERENTIAL/PLATELET
Basophils Absolute: 0.1 10*3/uL (ref 0.0–0.1)
Basophils Relative: 0.9 % (ref 0.0–3.0)
Eosinophils Absolute: 0.6 10*3/uL (ref 0.0–0.7)
Eosinophils Relative: 8.7 % — ABNORMAL HIGH (ref 0.0–5.0)
HCT: 46 % (ref 39.0–52.0)
Hemoglobin: 15 g/dL (ref 13.0–17.0)
Lymphocytes Relative: 25.3 % (ref 12.0–46.0)
Lymphs Abs: 1.7 10*3/uL (ref 0.7–4.0)
MCHC: 32.7 g/dL (ref 30.0–36.0)
MCV: 87.2 fl (ref 78.0–100.0)
Monocytes Absolute: 0.6 10*3/uL (ref 0.1–1.0)
Monocytes Relative: 8.7 % (ref 3.0–12.0)
Neutro Abs: 3.8 10*3/uL (ref 1.4–7.7)
Neutrophils Relative %: 56.4 % (ref 43.0–77.0)
Platelets: 227 10*3/uL (ref 150.0–400.0)
RBC: 5.27 Mil/uL (ref 4.22–5.81)
RDW: 14.7 % (ref 11.5–15.5)
WBC: 6.7 10*3/uL (ref 4.0–10.5)

## 2022-11-27 LAB — COMPREHENSIVE METABOLIC PANEL
ALT: 22 U/L (ref 0–53)
AST: 19 U/L (ref 0–37)
Albumin: 3.7 g/dL (ref 3.5–5.2)
Alkaline Phosphatase: 69 U/L (ref 39–117)
BUN: 25 mg/dL — ABNORMAL HIGH (ref 6–23)
CO2: 28 mEq/L (ref 19–32)
Calcium: 8.9 mg/dL (ref 8.4–10.5)
Chloride: 108 mEq/L (ref 96–112)
Creatinine, Ser: 1.42 mg/dL (ref 0.40–1.50)
GFR: 48.02 mL/min — ABNORMAL LOW (ref >60.00)
Glucose, Bld: 89 mg/dL (ref 70–99)
Potassium: 4.7 mEq/L (ref 3.5–5.1)
Sodium: 144 mEq/L (ref 135–145)
Total Bilirubin: 0.9 mg/dL (ref 0.2–1.2)
Total Protein: 6.3 g/dL (ref 6.0–8.3)

## 2022-11-27 LAB — CK: Total CK: 85 U/L (ref 7–232)

## 2022-11-27 MED ORDER — PREDNISONE 20 MG PO TABS: ORAL_TABLET | ORAL | 0 refills | Status: DC

## 2022-11-27 NOTE — Patient Instructions (Addendum)
Health Maintenance Due  Topic Date Due   Medicare Annual Wellness (AWV)  01/25/2023  You are eligible to schedule your annual wellness visit with our nurse specialist Otila Kluver.  Please consider scheduling this before you leave today  Please stop by lab before you go If you have mychart- we will send your results within 3 business days of [name] receiving them.  If you do not have mychart- we will call you about results within 5 business days of [name] receiving them.  *please also note that you will see labs on mychart as soon as they post. I will later go in and write notes on them- will say "notes from Dr. Yong Channel"   Trial prednisone to see if it will open sinuses. Let us know if not improving  Check blood pressure at least an hour or two after meds- was slightly high today - prefer <135/85 at home  Recommended follow up: Return in about 2 months (around 01/27/2023) for followup or sooner if needed.Schedule b4 you leave.

## 2022-11-27 NOTE — Progress Notes (Signed)
Phone [phone]   Subjective:  Bruce Ball is a 76 y.o. year old very pleasant male patient who presents for transitional care management and hospital follow up for encephalopathy related to UTI. Patient was hospitalized from 11/11/2022 to 11/13/2022. A TCM phone call was completed on 11/14/2022. Medical complexity moderate  76 year old male with multiple medical problems who presented from home after a fall associated with loss of balance and found to have some confusion-ultimately diagnosed with encephalopathy related to febrile acute cystitis.  He also had rhabdomyolysis with CK levels as high as 1800.  Was down to 793 by discharge  UTI ultimately determined to be Klebsiella-was treated with cefadroxil 1 g twice daily upon discharge.  Mental status improved during hospitalization.  He had some significant weakening and was referred to physical therapy and Occupational Therapy.  Urine culture was sensitive to cephalosporins-did receive 3 days of Rocephin in the hospital-bacteria was resistant to ampicillin.  Blood cultures were negative  Rhabdomyolysis was thought to be traumatic related to the fall and being down (patient states actually did not fall- instead had prolonged period on the toilet- several hours potentially).  CK improved during hospitalization-urine output was adequate and they encouraged oral hydration at home.  Liver function tests were close to normal so he was allowed to restart home statin which was initially held short term.  Appears endoscopic sinus surgery rescheduled from March with [facility] Dr. Scharlene Gloss- he thinks maybe even April.  - currently sinuses with increased congestion- fullness into ears. Using zyrtec. Hospital antibiotic didn't help- even ceftriaxxone. Prednisone has been the most helpful.   Chronic medical conditions were stable throughout hospitalization: - Hypertension controlled on carvedilol 25 mill once twice daily  -  Hyperlipidemia likely short-term with poor control as needed to hold statin but was able to resume on discharge - Chronic kidney disease stage III-no significant worsening from baseline - Neuropathy-remains on gabapentin - History of pulmonary embolism-Eliquis was on hold for upcoming surgery but it was restarted since the surgery was delayed as result of hospitalization.  Eliquis at reduced dose 2.5 mg twice daily  Traveling to [place] tomorrow to see grandyoungin! 1st one  No urinary frequency or burning since being home  Graduated from PT!    See problem oriented charting as well  Past Medical History-  Patient Active Problem List   Diagnosis Date Noted   CAD (coronary artery disease) 04/03/2021    Priority: High   Aortic atherosclerosis ([place]) 05/17/2020    Priority: High   Idiopathic neuropathy 01/28/2020    Priority: High   Recurrent falls 01/28/2020    Priority: High   High risk medication use 02/28/2017    Priority: High   Ataxia 05/18/2016    Priority: High   Vestibular disequilibrium 05/18/2016    Priority: High   B12 deficiency 05/17/2020    Priority: Medium    Iliac artery aneurysm ([place]) 05/17/2020    Priority: Medium    Osteoporosis 04/04/2020    Priority: Medium    GERD (gastroesophageal reflux disease) 12/22/2019    Priority: Medium    Essential tremor 12/22/2019    Priority: Medium    Right ureteral stone 11/05/2017    Priority: Medium    History of prostate cancer 03/08/2017    Priority: Medium    Personal history of prostate cancer 02/28/2017    Priority: Medium    Peripheral edema 06/08/2012    Priority: Medium    Backache 04/21/2008    Priority:  Medium    History of peptic ulcer disease 04/21/2008    Priority: Medium    Essential hypertension 10/29/2007    Priority: Medium    Chronic anticoagulation 03/26/2021    Priority: Low   Acquired coagulation disorder ([place]) 12/12/2020    Priority: Low   Senile purpura ([place]) 12/12/2020    Priority:  Low   History of total right hip replacement 03/08/2017    Priority: Low   DDD (degenerative disc disease), thoracic 02/28/2017    Priority: Low   Lung nodule, solitary 05/24/2014    Priority: Low   UTI (urinary tract infection) 81/82/9937   Acute metabolic encephalopathy 16/96/7893   UTI due to Klebsiella species 11/11/2022   Sinus disease 11/11/2022   Basal cell carcinoma of scalp 09/16/2022   Deposits (accretions) on teeth 09/16/2022   Dry eye syndrome of bilateral lacrimal glands 09/16/2022   Person encountering health services to consult on behalf of another person 09/16/2022   Encounter for issue of repeat prescription 09/16/2022   Encounter for fitting and adjustment of hearing aid 09/16/2022   Irreversible pulpitis 09/16/2022   Nontoxic single thyroid nodule 09/16/2022   Other chronic pain 09/16/2022   Pain in right hand 09/16/2022   Personal history of other venous thrombosis and embolism 09/16/2022   Hypertension 09/16/2022   Thyroid nodule 09/16/2022   Sensorineural hearing loss, bilateral 09/16/2022   Post-COVID syndrome 09/16/2022   Chronic pansinusitis 07/11/2022   Nasal obstruction 07/11/2022   Nasal polyposis 06/11/2022   Non-seasonal allergic rhinitis 06/11/2022   Abrasion of right arm 11/19/2021   PUD (peptic ulcer disease) 10/26/2021   Femur fracture, right ([place]) 10/17/2021   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   History of pulmonary embolism 10/10/2021   Chronic diastolic CHF (congestive heart failure) ([place]) 10/10/2021   CKD (chronic kidney disease), stage III ([place]) 10/10/2021   Hyperlipidemia 10/10/2021   Other fatigue 06/05/2021   SOB (shortness of breath) on exertion 03/26/2021   Anemia in chronic kidney disease 01/25/2021   S/P lumbar fusion 08/03/2020   Thin skin 07/25/2020   Age-related osteoporosis without current pathological fracture 07/11/2020   Encounter for immunization 07/11/2020   Other specified counseling 07/11/2020   Malignant  neoplasm of bone and articular cartilage ([place]) 07/11/2020   Malignant neoplasm of skin 07/11/2020   Meibomian gland dysfunction of right eye, unspecified eyelid 07/11/2020   Nuclear senile cataract 07/11/2020   Vitamin B12 deficient megaloblastic anemia 07/11/2020   Right lower quadrant pain 07/11/2020   Pyelonephritis 07/11/2020   Kidney stone 11/05/2017   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 Current Outpatient Medications  Medication Sig Dispense Refill   acetaminophen (TYLENOL) 500 MG tablet Take 500-1,000 mg by mouth every 6 (six) hours as needed for mild pain or headache.     apixaban (ELIQUIS) 2.5 MG TABS tablet Take 1 tablet (2.5 mg total) by mouth 2 (two) times daily.     atorvastatin (LIPITOR) 20 MG tablet Take 1 tablet (20 mg total) by mouth at bedtime. 90 tablet 3   carvedilol (COREG) 25 MG tablet Take 25 mg by mouth 2 (two) times daily.  cetirizine (ZYRTEC) 10 MG tablet Take 10 mg by mouth daily as needed for allergies or rhinitis.     Cholecalciferol (VITAMIN D3) 50 MCG (2000 UT) capsule Take 2,000 Units by mouth in the morning and at bedtime.      cyclobenzaprine (FLEXERIL) 10 MG tablet Take 5 mg by mouth at bedtime as needed (for muscle pain- causes "hangover," so do not drive).     ferrous sulfate 325 (65 FE) MG tablet Take 325 mg by mouth every Monday, Wednesday, and Friday.     fluticasone (FLONASE) 50 MCG/ACT nasal spray Place 1-2 sprays into both nostrils daily as needed for allergies or rhinitis.      furosemide (LASIX) 20 MG tablet Take 20 mg by mouth daily as needed (if the feet and/or legs swell).     gabapentin (NEURONTIN) 300 MG capsule Take 300 mg by mouth at bedtime as needed (FOR NERVE PAIN).     pantoprazole (PROTONIX) 40 MG tablet Take 40 mg by mouth 2 (two) times daily before a meal.     POTASSIUM PO Take 1 tablet by mouth See admin instructions. Take 1 tablet by mouth once a day ONLY WHEN TAKING FUROSEMIDE     predniSONE (DELTASONE) 20 MG tablet Take 2 pills for 3 days, 1 pill for 4 days 10 tablet 0   tiZANidine (ZANAFLEX) 4 MG tablet Take 4 mg by mouth every 6 (six) hours as needed for muscle spasms.     traMADol (ULTRAM) 50 MG tablet Take 50 mg by mouth every 6 (six) hours as needed for moderate pain.     No current facility-administered medications for this visit.   Objective  Objective:  BP (!) 150/80   Pulse 61   Temp (!) 97.4 F (36.3 C) (Temporal)   Ht '5\' 11"'$  (1.803 m)   Wt 236 lb 3.2 oz (107.1 kg)   SpO2 98%   BMI 32.94 kg/m  Gen: NAD, resting comfortably Nasal congestion noted CV: RRR no murmurs rubs or gallops Lungs: CTAB no crackles, wheeze, rhonchi Ext: trace to 1+ edema Skin: warm, dry Neuro: Walks with cane   Assessment and Plan:   # Acute cystitis-appears to have resolved with antibiotics.  With severity and with recent hospitalization we opted to repeat urine culture that we discussed with being asymptomatic there would be a risk of treating asymptomatic bacteriuria-they would prefer to be on the more cautious side with travel to [place] planned starting tomorrow -Did have encephalopathy related to this but fortunately has resolved  # Rhabdomyolysis-does not sound like this was actually traumatic but instead potentially related to prolonged sitting on toilet.  Fortunately was improving by time of discharge-will check CK again to make sure continues to improve -Did have some hematuria in the hospital has some stones-has been referred back to urology  but strongly suspect hematuria was related either UTI or stones in combination with Eliquis  # Chronic sinusitis-patient did not have any improvement on Rocephin in the hospital and has had about the most relief with prednisone in the past-does mildly immunosuppress but he would like to have some relief especially with upcoming travel-we sent in a 7-day course for him to local pharmacy  # Hypertension-mild poor control on carvedilol 25 mg twice daily with no other regular medications-has not had dose yet today and he agrees to do some home monitoring after taking-if remains elevated we may need to tweak medications  #hyperlipidemia-has been well-controlled in the past-we will plan on checking lipid panel on  our system at future visit-for now continue current medications with last LDL under 70   # History of pulmonary embolism-will continue to remain on Eliquis 2.5 mg twice daily to prevent recurrence unless directed by hematology differently  # CHF history-has Lasix available as needed but at present pushing fluids to try to help with rhabdomyolysis-does not appear to be acutely fluid overloaded-trace to 1+ edema largely stable  #senile purpura- noted. Stable. Check cbc at least annually including today. Often worse with prednisone- he knows this may worsen but willing to trial  Recommended follow up: Return in about 2 months (around 01/27/2023) for followup or sooner if needed.Schedule b4 you leave. Future Appointments  Date Time Provider [address]  01/31/2023 11:45 AM [facility] HEALTH COACH [facility]  05/06/2023  3:00 PM [facility] CCM PHARMACIST [facility]  05/13/2023  1:00 PM Turner, Eber Hong, MD [facility]    Lab/Order associations:   ICD-10-CM   1. Acute cystitis with hematuria  N30.01 Urine Culture    2. Essential hypertension  I10 CBC with Differential/Platelet    Comprehensive metabolic panel    3. Non-traumatic rhabdomyolysis  M62.82 CK (Creatine Kinase)      Meds ordered this encounter  Medications   predniSONE (DELTASONE) 20 MG tablet    Sig: Take 2 pills for 3 days, 1 pill for 4 days    Dispense:  10 tablet    Refill:  0   Please note patient did not meet qualification for TCM-had no ongoing extensive needs-had already graduated from physical therapy and did not need to coordinate further follow-up   Time Spent: 43 minutes of total time (8:40 AM- 9:23 AM) was spent on the date of the encounter performing the following actions: chart review prior to seeing the patient including summarizing discharge summary, obtaining history, performing a medically necessary exam, counseling on the treatment plan, placing orders, and documenting in our EHR.    Return precautions advised.  [name], MD

## 2022-11-27 NOTE — Telephone Encounter (Signed)
Caller states: -pt seen today 11/27/22 and request was discussed with PCP -BP was elevated -Most recent BP 150/75, no other symptoms -Travelling to [place] tomorrow 11/30 via car.   Caller Requests: -PCP team write script for medication that can bring BP down as needed. Hydrochlorothiazide?    Preferred Pharmacy: [id number] [id number]: [phone] Fax: [phone] #: [id number]

## 2022-11-28 ENCOUNTER — Telehealth: Payer: Self-pay | Admitting: Family Medicine

## 2022-11-28 ENCOUNTER — Encounter: Payer: Self-pay | Admitting: Family Medicine

## 2022-11-28 ENCOUNTER — Ambulatory Visit (INDEPENDENT_AMBULATORY_CARE_PROVIDER_SITE_OTHER): Payer: Medicare Other | Admitting: Family Medicine

## 2022-11-28 VITALS — BP 162/77 | HR 59 | Temp 98.2°F | Ht 71.0 in | Wt 239.4 lb

## 2022-11-28 DIAGNOSIS — I2584 Coronary atherosclerosis due to calcified coronary lesion: Secondary | ICD-10-CM

## 2022-11-28 DIAGNOSIS — I2510 Atherosclerotic heart disease of native coronary artery without angina pectoris: Secondary | ICD-10-CM | POA: Diagnosis not present

## 2022-11-28 DIAGNOSIS — I10 Essential (primary) hypertension: Secondary | ICD-10-CM | POA: Diagnosis not present

## 2022-11-28 MED ORDER — AMLODIPINE BESYLATE 2.5 MG PO TABS: 2.5000 mg | ORAL_TABLET | Freq: Every day | ORAL | 3 refills | Status: DC

## 2022-11-28 MED ORDER — ISOSORBIDE MONONITRATE ER 30 MG PO TB24: 30.0000 mg | ORAL_TABLET | Freq: Every day | ORAL | 3 refills | Status: DC

## 2022-11-28 NOTE — Telephone Encounter (Signed)
Caller states: -Following pt's OV with PCP on 11/27/22, he has continued with taking BP medication, Coreg 25 mg but BP is still elevated.  - Last night BP was 168/80, @ 8am on 11/30 it read 177/85 and while on phone was 171/90 - Patient is having no other symptoms  - Patient and caller are set to travel to [place] via car today and are concerned about patient's BP  Patient and caller have been transferred to triage.

## 2022-11-28 NOTE — Progress Notes (Signed)
Phone [phone] In person visit   Subjective:   Bruce Ball is a 76 y.o. year old very pleasant male patient who presents for/with See problem oriented charting Chief Complaint  Patient presents with   Hypertension    See triage note.     Past Medical History-  Patient Active Problem List   Diagnosis Date Noted   Post-COVID syndrome 09/16/2022    Priority: High   History of pulmonary embolism 10/10/2021    Priority: High   Chronic diastolic CHF (congestive heart failure) ([place]) 10/10/2021    Priority: High   CAD (coronary artery disease) 04/03/2021    Priority: High   Aortic atherosclerosis ([place]) 05/17/2020    Priority: High   Idiopathic neuropathy 01/28/2020    Priority: High   Recurrent falls 01/28/2020    Priority: High   History of prostate cancer 03/08/2017    Priority: High   High risk medication use 02/28/2017    Priority: High   Ataxia 05/18/2016    Priority: High   Vestibular disequilibrium 05/18/2016    Priority: High   Deposits (accretions) on teeth 09/16/2022    Priority: Medium    Chronic pansinusitis 07/11/2022    Priority: Medium    CKD (chronic kidney disease), stage III ([place]) 10/10/2021    Priority: Medium    Hyperlipidemia 10/10/2021    Priority: Medium    B12 deficiency 05/17/2020    Priority: Medium    Iliac artery aneurysm ([place]) 05/17/2020    Priority: Medium    Osteoporosis 04/04/2020    Priority: Medium    GERD (gastroesophageal reflux disease) 12/22/2019    Priority: Medium    Essential tremor 12/22/2019    Priority: Medium    Kidney stone 11/05/2017    Priority: Medium    Peripheral edema 06/08/2012    Priority: Medium    Backache 04/21/2008    Priority: Medium    History of peptic ulcer disease 04/21/2008    Priority: Medium    Essential hypertension 10/29/2007    Priority: Medium    UTI due to Klebsiella species 11/11/2022    Priority: Low   Sensorineural hearing loss, bilateral 09/16/2022    Priority: Low    Chronic anticoagulation 03/26/2021    Priority: Low   Acquired coagulation disorder ([place]) 12/12/2020    Priority: Low   Senile purpura ([place]) 12/12/2020    Priority: Low   History of total right hip replacement 03/08/2017    Priority: Low   DDD (degenerative disc disease), thoracic 02/28/2017    Priority: Low   Lung nodule, solitary 05/24/2014    Priority: Low   Basal cell carcinoma of scalp 09/16/2022   Nontoxic single thyroid nodule 09/16/2022   Thyroid nodule 09/16/2022   Non-seasonal allergic rhinitis 06/11/2022   Abrasion of right arm 11/19/2021   Femur fracture, right ([place]) 10/17/2021   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     amLODipine (NORVASC) 2.5 MG tablet Take 1 tablet (2.5 mg total) by mouth daily. 90 tablet 3   apixaban (ELIQUIS) 2.5 MG TABS tablet Take 1 tablet (2.5 mg  total) by mouth 2 (two) times daily.     atorvastatin (LIPITOR) 20 MG tablet Take 1 tablet (20 mg total) by mouth at bedtime. 90 tablet 3   carvedilol (COREG) 25 MG tablet Take 25 mg by mouth 2 (two) times daily.     cetirizine (ZYRTEC) 10 MG tablet Take 10 mg by mouth daily as needed for allergies or rhinitis.     Cholecalciferol (VITAMIN D3) 50 MCG (2000 UT) capsule Take 2,000 Units by mouth in the morning and at bedtime.      cyclobenzaprine (FLEXERIL) 10 MG tablet Take 5 mg by mouth at bedtime as needed (for muscle pain- causes "hangover," so do not drive).     ferrous sulfate 325 (65 FE) MG tablet Take 325 mg by mouth every Monday, Wednesday, and  Friday.     fluticasone (FLONASE) 50 MCG/ACT nasal spray Place 1-2 sprays into both nostrils daily as needed for allergies or rhinitis.     furosemide (LASIX) 20 MG tablet Take 20 mg by mouth daily as needed (if the feet and/or legs swell).     gabapentin (NEURONTIN) 300 MG capsule Take 300 mg by mouth at bedtime as needed (FOR NERVE PAIN).     pantoprazole (PROTONIX) 40 MG tablet Take 40 mg by mouth 2 (two) times daily before a meal.     POTASSIUM PO Take 1 tablet by mouth See admin instructions. Take 1 tablet by mouth once a day ONLY WHEN TAKING FUROSEMIDE     predniSONE (DELTASONE) 20 MG tablet Take 2 pills for 3 days, 1 pill for 4 days 10 tablet 0   tiZANidine (ZANAFLEX) 4 MG tablet Take 4 mg by mouth every 6 (six) hours as needed for muscle spasms.     traMADol (ULTRAM) 50 MG tablet Take 50 mg by mouth every 6 (six) hours as needed for moderate pain.     No current facility-administered medications for this visit.     Objective:  BP (!) 162/77 (BP Location: Left Arm, Patient Position: Sitting)   Pulse (!) 59   Temp 98.2 F (36.8 C) (Temporal)   Ht '5\' 11"'$  (1.803 m)   Wt 239 lb 6.4 oz (108.6 kg)   SpO2 96%   BMI 33.39 kg/m  Gen: NAD, resting comfortably CV: RRR no murmurs rubs or gallops Lungs: CTAB no crackles, wheeze, rhonchi Ext: trace edema- always tender with this eval Skin: warm, dry    Assessment and Plan  #hypertension S: medication: Carvedilol 25 mg twice daily Home readings #s: have also been elevated - went home to confirm BP Readings from Last 3 Encounters:  11/28/22 (!) 162/77  11/27/22 (!) 150/80  11/13/22 (!) 156/73  A/P: Poor control since hospitalization of blood pressure-unclear why blood pressure is elevated since that time with ongoing carvedilol and unchanged dose - I originally sent in a prescription for Imdur 30 mg but patient/family want to discuss (we had a great phone call with daughter today).  He had lightheadedness in the past with this but I  believe blood pressure was running lower but they wanted to hold off on retrialing this. - Daughter wanted to advocate for as needed hydralazine but my concern was underlying CAD-she had an excellent counterpoint about this being at higher doses with prolonged use - We discussed alternative of amlodipine-some risk of worsening CHF but he has been well compensated and has not even needed Lasix (I do not think fluid overload is causing his edema) - We opted for 2.5 mg  dose of amlodipine with flexibility to try 1.25 mg if blood pressure running lower or up to 5 mg if blood pressure does not respond within a few days.  Would love for them to update me in 2 or 3 weeks with home readings -they are traveling to [address] today to be with their daughter with grandbaby due soon!   Recommended follow up: Return for as needed for new, worsening, persistent symptoms. Basically for elevated BP readings or other issues prior to January viist Future Appointments  Date Time Provider [place]  01/27/2023 10:20 AM [name], MD [facility]  01/31/2023 11:45 AM [facility] HEALTH COACH [facility]  05/06/2023  3:00 PM [facility] CCM PHARMACIST [facility]  05/13/2023  1:00 PM Turner, Eber Hong, MD [facility]    Lab/Order associations:   ICD-10-CM   1. Essential hypertension  I10       Meds ordered this encounter  Medications   amLODipine (NORVASC) 2.5 MG tablet    Sig: Take 1 tablet (2.5 mg total) by mouth daily.    Dispense:  90 tablet    Refill:  3    Return precautions advised.  [name], MD

## 2022-11-28 NOTE — Telephone Encounter (Signed)
Pt was seen in office today with Dr.Hunter

## 2022-11-28 NOTE — Telephone Encounter (Signed)
Had been on imdur in past but BP low in past and was stopped- lets restart that on regular basis and update me in 2 weeks perhaps with home readings (or can schedule visit when gets back from travel if prefers)

## 2022-11-28 NOTE — Telephone Encounter (Signed)
Patient Name: Bruce Ball Gender: Male DOB: [birth year] Age: 76 Y 36 M 13 D Return Phone Number: [phone] (Primary), [phone] (Secondary) Address: City/ State/ Zip: [place]  [address] Client [place] [facility] [place] [facility] [place] [facility] [place] [facility] Provider [name]- MD Contact Type Call Who Is Calling Patient / Member / Family / Caregiver Call Type Triage / Clinical Caller Name [name] Relationship To Patient Spouse Return Phone Number [phone] (Secondary) Chief Complaint Blood Pressure High Reason for Call Symptomatic / Request for Bruce Ball states her husband has a high blood pressure reading on his blood pressure medication. Caller states he was seen yesterday and was advised to keep an eye on his blood pressure reading and continue his medication. Caller states last night it was 168/80 , at 8 am it was 177/85 after his medication and his current one is 171/90. Caller states they are going to be heading to [place] but they are waiting to hear back from Dr. Yong Channel before leaving. Translation No Nurse Assessment Nurse: Raenette Rover, RN, [name] Date/Time Eilene Ghazi Time): 11/28/2022 9:32:44 AM Confirm and document reason for call. If symptomatic, describe symptoms. ---Advised to stay on current meds. Coreg 25 mg bid His bp is normally 120/70, It was high in the office she said as high as 220 sbp at one time. last night it was 168/80 , at 8 am it was 177/85 after his medication and his current one is 171/90. Caller states they are going to be heading to [place] but they are waiting to hear back from Dr. Yong Channel before leaving. Hr was 68 this morning. They are wanting to ask for an additional Med Hydralazine per pharmacy recommendation that wont alter his hear rate. Does the patient have any new or worsening symptoms? ---Yes Will a triage be completed? ---Yes Related  visit to physician within the last 2 weeks? ---Yes Does the PT have any chronic conditions? (i.e. diabetes, asthma, this includes High risk factors for pregnancy, etc.) ---Yes PLEASE NOTE: All timestamps contained within this report are represented as [place] Standard Time. CONFIDENTIALTY NOTICE: This fax transmission is intended only for the addressee. It contains information that is legally privileged, confidential or otherwise protected from use or disclosure. If you are not the intended recipient, you are strictly prohibited from reviewing, disclosing, copying using or disseminating any of this information or taking any action in reliance on or regarding this information. If you have received this fax in error, please notify us immediately by telephone so that we can arrange for its return to [name]. Phone: [phone], Toll-Free: [phone], Fax: [phone] Page: 2 of 2 Call Id: [id number] Nurse Assessment List chronic conditions. ---htn Is this a behavioral health or substance abuse call? ---No Guidelines Guideline Title Affirmed Question Affirmed Notes Nurse Date/Time (Eastern Time) Blood Pressure - High Systolic BP >= 681 OR Diastolic >= 275 Raenette Rover, RN, [name] 11/28/2022 9:36:25 AM Disp. Time Eilene Ghazi Time) Disposition Final User 11/28/2022 9:43:47 AM SEE PCP WITHIN 3 DAYS Yes Raenette Rover, RN, [name] Final Disposition 11/28/2022 9:43:47 AM SEE PCP WITHIN 3 DAYS Yes Raenette Rover, RN, Herbert Deaner Disagree/Comply Comply Caller Understands Yes PreDisposition Did not know what to do Care Advice Given Per Guideline * You need to be seen within 2 or 3 days. SEE PCP WITHIN 3 DAYS: CALL BACK IF: * Weakness or numbness of the face, arm or leg on one side of the body occurs * Difficulty walking, difficulty talking, or severe  headache occurs * Chest pain or difficulty breathing occurs * Your blood pressure is over 180/110 * You become worse CARE ADVICE given per High Blood Pressure (Adult)  guideline. Comments User: Wilson Singer, RN Date/Time Eilene Ghazi Time): 11/28/2022 9:47:33 AM Per back line and Vaughan Basta appt made for 10:20 am this morning for adjustments prior to leaving town.

## 2022-11-28 NOTE — Telephone Encounter (Signed)
Please see patient message and advise.

## 2022-11-28 NOTE — Patient Instructions (Addendum)
Try low dose amlodipine 2.5 mg. Can take just half tablet this morning and make sure you tolerate and take another half tablet later today. If blood pressure within 3-4 days still above 150 could even take 2nd dose per day of 2.5 mg (so total of 5 mg for the day). Can also just take 2.5 mg full tablet today if easier  Need to watch closely for swelling as can worsen heart failure (but he has been so well controlled with not even needing lasix- can take lasix as needed)  Tell pharmacy you do not need the imdur and can remove from his prescriptions  Recommended follow up: Return for as needed for new, worsening, persistent symptoms. OR with elevated blood pressure readings

## 2022-11-28 NOTE — Telephone Encounter (Signed)
See other phone note about imdur

## 2022-11-30 LAB — URINE CULTURE
MICRO NUMBER:: 14246221
SPECIMEN QUALITY:: ADEQUATE

## 2022-12-02 ENCOUNTER — Encounter: Payer: Self-pay | Admitting: Family Medicine

## 2022-12-03 ENCOUNTER — Other Ambulatory Visit: Payer: Self-pay

## 2022-12-03 ENCOUNTER — Telehealth: Payer: Self-pay | Admitting: Family Medicine

## 2022-12-03 ENCOUNTER — Telehealth: Payer: Self-pay | Admitting: Pharmacist

## 2022-12-03 DIAGNOSIS — B9689 Other specified bacterial agents as the cause of diseases classified elsewhere: Secondary | ICD-10-CM

## 2022-12-03 MED ORDER — CEPHALEXIN 500 MG PO CAPS: 500.0000 mg | ORAL_CAPSULE | Freq: Three times a day (TID) | ORAL | 0 refills | Status: DC | PRN

## 2022-12-03 NOTE — Telephone Encounter (Signed)
Caller States: -pt received a phone call but cannot hear the person on the other line.  -Not able to tell what the number is on the caller ID. -Not related to CCM Pharmacist note from today 12/03/22   Caller Requests: -Return call one wife's phone instead of patients cell.

## 2022-12-03 NOTE — Telephone Encounter (Signed)
Please see patient message and advise.

## 2022-12-03 NOTE — Telephone Encounter (Signed)
Noted! Will call pt wife now

## 2022-12-03 NOTE — Progress Notes (Unsigned)
Chronic Care Management Pharmacy Assistant   Name: Bruce Ball  MRN: [id number] DOB: [birth year]   Reason for Encounter: Hypertension Adherence Call    Recent office visits:  11/28/2022 OV (PCP) [name], MD; - We opted for 2.5 mg dose of amlodipine with flexibility to try 1.25 mg if blood pressure running lower or up to 5 mg if blood pressure does not respond within a few days.  Would love for them to update me in 2 or 3 weeks with home readings.  11/27/2022 OV (PCP) [name], MD;  trial of prednisone  Recent consult visits:  None  Hospital visits:  11/11/2022 ED to Hospital Admission due to Acute Cystitis Admit date: 11/11/2022 Discharge date: 11/13/2022 -No medication changes noted.  10/18/2022 ED visit for Acute cystitis with hematuria -Keflex 500 mg 3 times daily  Medications: Outpatient Encounter Medications as of 12/03/2022  Medication Sig Note   acetaminophen (TYLENOL) 500 MG tablet Take 500-1,000 mg by mouth every 6 (six) hours as needed for mild pain or headache.    amLODipine (NORVASC) 2.5 MG tablet Take 1 tablet (2.5 mg total) by mouth daily.    apixaban (ELIQUIS) 2.5 MG TABS tablet Take 1 tablet (2.5 mg total) by mouth 2 (two) times daily.    atorvastatin (LIPITOR) 20 MG tablet Take 1 tablet (20 mg total) by mouth at bedtime.    carvedilol (COREG) 25 MG tablet Take 25 mg by mouth 2 (two) times daily. 11/12/2022: The patient's wife just called. She now said he is taking 25 mg two times a day.   cetirizine (ZYRTEC) 10 MG tablet Take 10 mg by mouth daily as needed for allergies or rhinitis.    Cholecalciferol (VITAMIN D3) 50 MCG (2000 UT) capsule Take 2,000 Units by mouth in the morning and at bedtime.     cyclobenzaprine (FLEXERIL) 10 MG tablet Take 5 mg by mouth at bedtime as needed (for muscle pain- causes "hangover," so do not drive).    ferrous sulfate 325 (65 FE) MG tablet Take 325 mg by mouth every Monday, Wednesday, and Friday.     fluticasone (FLONASE) 50 MCG/ACT nasal spray Place 1-2 sprays into both nostrils daily as needed for allergies or rhinitis.    furosemide (LASIX) 20 MG tablet Take 20 mg by mouth daily as needed (if the feet and/or legs swell). 11/12/2022: Taken rarely   gabapentin (NEURONTIN) 300 MG capsule Take 300 mg by mouth at bedtime as needed (FOR NERVE PAIN).    pantoprazole (PROTONIX) 40 MG tablet Take 40 mg by mouth 2 (two) times daily before a meal.    POTASSIUM PO Take 1 tablet by mouth See admin instructions. Take 1 tablet by mouth once a day ONLY WHEN TAKING FUROSEMIDE 11/12/2022: Strength not recalled    predniSONE (DELTASONE) 20 MG tablet Take 2 pills for 3 days, 1 pill for 4 days    tiZANidine (ZANAFLEX) 4 MG tablet Take 4 mg by mouth every 6 (six) hours as needed for muscle spasms.    traMADol (ULTRAM) 50 MG tablet Take 50 mg by mouth every 6 (six) hours as needed for moderate pain.    No facility-administered encounter medications on file as of 12/03/2022.   Reviewed chart prior to disease state call. Spoke with patient regarding BP  Recent Office Vitals: BP Readings from Last 3 Encounters:  11/28/22 (!) 162/77  11/27/22 (!) 150/80  11/13/22 (!) 156/73   Pulse Readings from Last 3 Encounters:  11/28/22 (!) 59  11/27/22 61  11/13/22 70    Wt Readings from Last 3 Encounters:  11/28/22 239 lb 6.4 oz (108.6 kg)  11/27/22 236 lb 3.2 oz (107.1 kg)  11/11/22 244 lb 11.4 oz (111 kg)     Kidney Function Lab Results  Component Value Date/Time   CREATININE 1.42 11/27/2022 09:28 AM   CREATININE 1.28 (H) 11/13/2022 04:43 AM   CREATININE 1.67 (H) 01/25/2021 11:25 AM   CREATININE 1.42 (H) 07/25/2020 11:54 AM   GFR 48.02 (L) 11/27/2022 09:28 AM   GFRNONAA 58 (L) 11/13/2022 04:43 AM   GFRNONAA 43 (L) 01/25/2021 11:25 AM   GFRAA 56 (L) 07/25/2020 11:54 AM       Latest Ref Rng & Units 11/27/2022    9:28 AM 11/13/2022    4:43 AM 11/12/2022   12:40 AM  BMP  Glucose 70 - 99 mg/dL 89  93   106   BUN 6 - 23 mg/dL '25  19  20   '$ Creatinine 0.40 - 1.50 mg/dL 1.42  1.28  1.31   Sodium 135 - 145 mEq/L 144  140  137   Potassium 3.5 - 5.1 mEq/L 4.7  3.5  3.5   Chloride 96 - 112 mEq/L 108  108  107   CO2 19 - 32 mEq/L '28  25  22   '$ Calcium 8.4 - 10.5 mg/dL 8.9  8.0  8.2     Current antihypertensive regimen:  Amlodipine 2.5 mg daily Carvedilol 25 mg Furosemide 20 mg daily  How often are you checking your Blood Pressure? {CHL HP BP Monitoring Frequency:[phone]}  Current home BP readings: ***  What recent interventions/DTPs have been made by any provider to improve Blood Pressure control since last CPP Visit: ***  Any recent hospitalizations or ED visits since last visit with CPP? {yes/no:20286}  What diet changes have been made to improve Blood Pressure Control?  ***  What exercise is being done to improve your Blood Pressure Control?  ***  Adherence Review: Is the patient currently on ACE/ARB medication? {yes/no:20286} Does the patient have >5 day gap between last estimated fill dates? {yes/no:20286}   Care Gaps: Medicare Annual Wellness: Scheduled 01/31/2023 Hemoglobin A1C: 5.5% on 06/05/2021 Colonoscopy: Completed 09/26/2021  Future Appointments  Date Time Provider [place]  01/27/2023 10:20 AM [name], MD [facility]  01/31/2023 11:45 AM [facility] HEALTH COACH [facility]  05/06/2023  3:00 PM [facility] PHARMACIST [facility]  05/13/2023  1:00 PM Sueanne Margarita, MD [facility]   Star Rating Drugs: Atorvastatin 20 mg  April D Calhoun, [place] Pharmacist Assistant [phone]

## 2022-12-04 MED ORDER — CEFDINIR 300 MG PO CAPS: 300.0000 mg | ORAL_CAPSULE | Freq: Two times a day (BID) | ORAL | 0 refills | Status: DC

## 2022-12-04 NOTE — Telephone Encounter (Signed)
Called and lvm, stating for pt to give call back or send MyChart message, letting me know the pharmacy he wants to use in [place].

## 2022-12-15 DIAGNOSIS — R059 Cough, unspecified: Secondary | ICD-10-CM | POA: Diagnosis not present

## 2022-12-15 DIAGNOSIS — Z9109 Other allergy status, other than to drugs and biological substances: Secondary | ICD-10-CM | POA: Diagnosis not present

## 2022-12-15 DIAGNOSIS — B349 Viral infection, unspecified: Secondary | ICD-10-CM | POA: Diagnosis not present

## 2022-12-15 DIAGNOSIS — Z20822 Contact with and (suspected) exposure to covid-19: Secondary | ICD-10-CM | POA: Diagnosis not present

## 2023-01-27 ENCOUNTER — Ambulatory Visit: Payer: Medicare Other | Admitting: Family Medicine

## 2023-01-28 ENCOUNTER — Encounter: Payer: Self-pay | Admitting: Family Medicine

## 2023-01-28 ENCOUNTER — Telehealth (INDEPENDENT_AMBULATORY_CARE_PROVIDER_SITE_OTHER): Payer: Medicare Other | Admitting: Family Medicine

## 2023-01-28 ENCOUNTER — Other Ambulatory Visit: Payer: Self-pay

## 2023-01-28 VITALS — BP 147/65 | HR 54 | Ht 71.0 in | Wt 240.0 lb

## 2023-01-28 DIAGNOSIS — R310 Gross hematuria: Secondary | ICD-10-CM

## 2023-01-28 DIAGNOSIS — N390 Urinary tract infection, site not specified: Secondary | ICD-10-CM

## 2023-01-28 DIAGNOSIS — I10 Essential (primary) hypertension: Secondary | ICD-10-CM

## 2023-01-28 LAB — URINALYSIS, ROUTINE W REFLEX MICROSCOPIC
Bilirubin Urine: NEGATIVE
Ketones, ur: NEGATIVE
Nitrite: NEGATIVE
Specific Gravity, Urine: 1.02 (ref 1.000–1.030)
Total Protein, Urine: 30 — AB
Urine Glucose: NEGATIVE
Urobilinogen, UA: 0.2 (ref 0.0–1.0)
pH: 6 (ref 5.0–8.0)

## 2023-01-28 NOTE — Addendum Note (Signed)
Addended by: Loura Back on: 01/28/2023 01:40 PM   Modules accepted: Orders

## 2023-01-28 NOTE — Progress Notes (Signed)
Phone [phone] Virtual visit via Video note   Subjective:  Chief complaint: Chief Complaint  Patient presents with   Follow-up   Hypertension    This visit type was conducted due to national recommendations for restrictions regarding the COVID-19 Pandemic (e.g. social distancing).  This format is felt to be most appropriate for this patient at this time balancing risks to patient and risks to population by having him in for in person visit.  No physical exam was performed (except for noted visual exam or audio findings with Telehealth visits).    Our team/I connected with [name] at 10:20 AM EST by a video enabled telemedicine application (doxy.me or caregility through epic) and verified that I am speaking with the correct person using two identifiers.  Location patient: [place] Location provider: [facility], office Persons participating in the virtual visit:  patient  Our team/I discussed the limitations of evaluation and management by telemedicine and the availability of in person appointments. In light of current covid-19 pandemic, patient also understands that we are trying to protect them by minimizing in office contact if at all possible.  The patient expressed consent for telemedicine visit and agreed to proceed. Patient understands insurance will be billed.   Past Medical History-  Patient Active Problem List   Diagnosis Date Noted   Post-COVID syndrome 09/16/2022    Priority: High   History of pulmonary embolism 10/10/2021    Priority: High   Chronic diastolic CHF (congestive heart failure) ([place]) 10/10/2021    Priority: High   CAD (coronary artery disease) 04/03/2021    Priority: High   Aortic atherosclerosis ([place]) 05/17/2020    Priority: High   Idiopathic neuropathy 01/28/2020    Priority: High   Recurrent falls 01/28/2020    Priority: High   History of prostate cancer 03/08/2017    Priority: High   High risk medication use 02/28/2017    Priority: High    Ataxia 05/18/2016    Priority: High   Vestibular disequilibrium 05/18/2016    Priority: High   Deposits (accretions) on teeth 09/16/2022    Priority: Medium    Chronic pansinusitis 07/11/2022    Priority: Medium    CKD (chronic kidney disease), stage III ([place]) 10/10/2021    Priority: Medium    Hyperlipidemia 10/10/2021    Priority: Medium    B12 deficiency 05/17/2020    Priority: Medium    Iliac artery aneurysm ([place]) 05/17/2020    Priority: Medium    Osteoporosis 04/04/2020    Priority: Medium    GERD (gastroesophageal reflux disease) 12/22/2019    Priority: Medium    Essential tremor 12/22/2019    Priority: Medium    Kidney stone 11/05/2017    Priority: Medium    Peripheral edema 06/08/2012    Priority: Medium    Backache 04/21/2008    Priority: Medium    History of peptic ulcer disease 04/21/2008    Priority: Medium    Essential hypertension 10/29/2007    Priority: Medium    UTI due to Klebsiella species 11/11/2022    Priority: Low   Sensorineural hearing loss, bilateral 09/16/2022    Priority: Low   Chronic anticoagulation 03/26/2021    Priority: Low   Acquired coagulation disorder ([place]) 12/12/2020    Priority: Low   Senile purpura ([place]) 12/12/2020    Priority: Low   History of total right hip replacement 03/08/2017    Priority: Low   DDD (degenerative disc disease), thoracic 02/28/2017    Priority: Low  Lung nodule, solitary 05/24/2014    Priority: Low   Basal cell carcinoma of scalp 09/16/2022   Nontoxic single thyroid nodule 09/16/2022   Thyroid nodule 09/16/2022   Non-seasonal allergic rhinitis 06/11/2022   Abrasion of right arm 11/19/2021   Femur fracture, right ([place]) 10/17/2021   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     amLODipine (NORVASC) 2.5 MG tablet Take 1 tablet (2.5 mg total) by mouth daily. 90 tablet 3   apixaban (ELIQUIS) 2.5 MG TABS tablet Take 1 tablet (2.5 mg total) by mouth 2 (two) times daily.     atorvastatin (LIPITOR) 20 MG tablet Take 1 tablet (20 mg total) by mouth at bedtime. 90 tablet 3   carvedilol (COREG) 25 MG tablet Take 25 mg by mouth 2 (two) times daily.     cephALEXin (KEFLEX) 500 MG capsule Take 1 capsule (500 mg total) by mouth every 8 (eight) hours as needed. 21 capsule 0   cetirizine (ZYRTEC) 10 MG tablet Take 10 mg by mouth daily as needed for allergies or rhinitis.     Cholecalciferol (VITAMIN D3) 50 MCG (2000 UT) capsule Take 2,000 Units by mouth in the morning and at bedtime.      fluticasone (FLONASE) 50 MCG/ACT nasal spray Place 1-2 sprays into both nostrils daily as needed for allergies or rhinitis.     furosemide (LASIX) 20 MG tablet Take 20 mg by mouth daily as needed (if the feet and/or legs swell).     pantoprazole (PROTONIX) 40 MG tablet Take 40 mg by mouth 2 (two) times daily before a meal.     POTASSIUM PO Take 1 tablet by mouth See admin instructions. Take 1 tablet by mouth once a day ONLY WHEN TAKING FUROSEMIDE     cefdinir (OMNICEF) 300 MG capsule Take 1 capsule (300 mg total) by mouth 2 (two) times daily. 14 capsule 0   cyclobenzaprine (FLEXERIL) 10 MG tablet Take 5 mg by mouth at bedtime as needed (for muscle pain- causes "hangover," so do not drive).     ferrous sulfate 325 (65 FE) MG tablet Take 325 mg by mouth every Monday, Wednesday, and Friday.     gabapentin (NEURONTIN) 300 MG capsule Take 300 mg by mouth at bedtime as needed (FOR NERVE PAIN).     predniSONE (DELTASONE) 20 MG tablet Take 2 pills for 3 days, 1 pill for 4 days 10  tablet 0   tiZANidine (ZANAFLEX) 4 MG tablet Take 4 mg by mouth every 6 (six) hours as needed for muscle spasms.     traMADol (ULTRAM) 50 MG tablet Take 50 mg by mouth every 6 (six) hours as needed for moderate pain.     No current facility-administered medications for this visit.     Objective:  BP (!) 147/65   Pulse (!) 54   Ht '5\' 11"'$  (1.803 m)   Wt 240 lb (108.9 kg)   BMI 33.47 kg/m  self reported vitals Gen: NAD, resting comfortably Lungs: nonlabored, normal respiratory rate  Skin: appears dry, no obvious rash     Assessment and Plan   #  social update- enjoyed beautiful granddaughter in [place]. Was there for a month -has life alert due to falls  #hypertension S: medication: Amlodipine 2.5 mg (only taking for BP over 150 though), carvedilol 25 mg twice daily, Lasix 20 mg daily if needed -Lightheadedness in the past on Imdur -equilibrium issues contribute to falls- upcoming sinus surgery.  Reports had a fall the other day essentially right after standing up Home readings #s: 130s/high 80s  BP Readings from Last 3 Encounters:  01/28/23 (!) 147/65  11/28/22 (!) 162/77  11/27/22 (!) 150/80  A/P: Mild poor control of blood pressure on one hand but on the other hand still having orthostatic issues-we decided to have him take his amlodipine if blood pressure is above 145 instead of the 150 threshold he currently has as long as he has no worsening orthostatic issues -Also mentioned if heart rate gets in the lower to consider half dose of carvedilol and if below 50 could even hold dose  #Concern for UTI # History of gross hematuria-in December 2023 as below in patient with history of prostate cancer S: Patients symptoms started a week ago- this morning noted a small amount of white discharge in the urine.  Complains of dysuria: yes; polyuria: no; nocturia: yes; urgency: yes particularly at night.  Symptoms are stable. Some flank pain   Did need antibiotic that we sent in early  december- ended up developing symptoms including hematuria ROS- no fever, chills, nausea, vomiting. No blood in urine.  A/P: UA  and culture will be obtained. Empiric treatment discussed- prefers to wait on culture with recent antibiotic use Patient to follow up if new or worsening symptoms or failure to improve.   With gross hematuria and recurrent UTIs will refer to urology for their opinion 11/11/2022 Klebsiella pneumonia greater than 100,000 colonies resistant to ampicillin treated with Keflex 11/27/2022 Klebsiella pneumonia 10-49,000 colonies but treated as symptomatic including hematuria.  Treated with cefdinir  #History of falls with generalized weakness and equilibrium issues- wants to do PT but has upcoming trip and will reach out when ready  Recommended follow up: 3-6 month follow up recommended Future Appointments  Date Time Provider [place]  02/04/2023  2:00 PM [facility] HEALTH COACH [facility]  05/06/2023  3:00 PM Edythe Clarity, [place] None  05/13/2023  1:00 PM Sueanne Margarita, MD [facility]    Lab/Order associations:   ICD-10-CM   1. Gross hematuria  R31.0 Ambulatory referral to Urology    Urine Culture    Urinalysis, Routine w reflex microscopic    2. Recurrent UTI  N39.0 Ambulatory referral to Urology    Urine Culture    Urinalysis, Routine w reflex microscopic    3. Essential hypertension  I10        Return precautions advised.  [name], MD

## 2023-01-30 ENCOUNTER — Other Ambulatory Visit: Payer: Self-pay | Admitting: Family Medicine

## 2023-01-30 ENCOUNTER — Encounter: Payer: Self-pay | Admitting: Family Medicine

## 2023-01-30 LAB — URINE CULTURE
MICRO NUMBER:: 14493120
SPECIMEN QUALITY:: ADEQUATE

## 2023-01-30 MED ORDER — SULFAMETHOXAZOLE-TRIMETHOPRIM 800-160 MG PO TABS: 1.0000 | ORAL_TABLET | Freq: Two times a day (BID) | ORAL | 0 refills | Status: DC

## 2023-02-04 ENCOUNTER — Ambulatory Visit (INDEPENDENT_AMBULATORY_CARE_PROVIDER_SITE_OTHER): Payer: Medicare Other

## 2023-02-04 VITALS — Wt 240.0 lb

## 2023-02-04 DIAGNOSIS — Z0000 Encounter for general adult medical examination without abnormal findings: Secondary | ICD-10-CM | POA: Diagnosis not present

## 2023-02-04 NOTE — Patient Instructions (Signed)
Bruce Ball , Thank you for taking time to come for your Medicare Wellness Visit. I appreciate your ongoing commitment to your health goals. Please review the following plan we discussed and let me know if I can assist you in the future.   These are the goals we discussed:  Goals       No needs (pt-stated)      Care Coordination Interventions: Reviewed medications with patient and discussed adherence to all medications with no needed refills Reviewed scheduled/upcoming provider appointments including pending appointments Screening for signs and symptoms of depression related to chronic disease state  Assessed social determinant of health barriers       Patient Stated      Lose weight       Patient Stated      None at this time       Track and Manage Fluids and Swelling-Heart Failure      Timeframe:  Long-Range Goal Priority:  High Start Date:  04/09/2021                     Expected End Date:  04/09/2022                     Follow Up Date 04/2021    - call office if I gain more than 2 pounds in one day or 5 pounds in one week - track weight in diary - use salt in moderation - watch for swelling in feet, ankles and legs every day - weigh myself daily    Why is this important?   It is important to check your weight daily and watch how much salt and liquids you have.  It will help you to manage your heart failure.    Notes:       Track and Manage My Blood Pressure-Hypertension      Timeframe:  Long-Range Goal Priority:  High Start Date:  04/09/2021                     Expected End Date:  04/09/2022                     Follow Up Date 04/2021  - check blood pressure 3 times per week - write blood pressure results in a log or diary    Why is this important?   You won't feel high blood pressure, but it can still hurt your blood vessels.  High blood pressure can cause heart or kidney problems. It can also cause a stroke.  Making lifestyle changes like losing a little weight or  eating less salt will help.  Checking your blood pressure at home and at different times of the day can help to control blood pressure.  If the doctor prescribes medicine remember to take it the way the doctor ordered.  Call the office if you cannot afford the medicine or if there are questions about it.     Notes:         This is a list of the screening recommended for you and due dates:  Health Maintenance  Topic Date Due   COVID-19 Vaccine (9 - 2023-24 season) 08/30/2022   Zoster (Shingles) Vaccine (2 of 2) 02/27/2023*   Pneumonia Vaccine (2 - PPSV23 or PCV20) 03/08/2023*   Medicare Annual Wellness Visit  02/05/2024   DTaP/Tdap/Td vaccine (5 - Td or Tdap) 02/13/2026   Colon Cancer Screening  09/26/2026   Flu Shot  Completed   Hepatitis C Screening: USPSTF Recommendation to screen - Ages 26-79 yo.  Completed   HPV Vaccine  Aged Out  *Topic was postponed. The date shown is not the original due date.    Advanced directives: Please bring a copy of your health care power of attorney and living will to the office at your convenience.  Conditions/risks identified: none at this time   Next appointment: Follow up in one year for your annual wellness visit.   Preventive Care 48 Years and Older, Male  Preventive care refers to lifestyle choices and visits with your health care provider that can promote health and wellness. What does preventive care include? A yearly physical exam. This is also called an annual well check. Dental exams once or twice a year. Routine eye exams. Ask your health care provider how often you should have your eyes checked. Personal lifestyle choices, including: Daily care of your teeth and gums. Regular physical activity. Eating a healthy diet. Avoiding tobacco and drug use. Limiting alcohol use. Practicing safe sex. Taking low doses of aspirin every day. Taking vitamin and mineral supplements as recommended by your health care provider. What happens  during an annual well check? The services and screenings done by your health care provider during your annual well check will depend on your age, overall health, lifestyle risk factors, and family history of disease. Counseling  Your health care provider may ask you questions about your: Alcohol use. Tobacco use. Drug use. Emotional well-being. Home and relationship well-being. Sexual activity. Eating habits. History of falls. Memory and ability to understand (cognition). Work and work Statistician. Screening  You may have the following tests or measurements: Height, weight, and BMI. Blood pressure. Lipid and cholesterol levels. These may be checked every 5 years, or more frequently if you are over 11 years old. Skin check. Lung cancer screening. You may have this screening every year starting at age 41 if you have a 30-pack-year history of smoking and currently smoke or have quit within the past 15 years. Fecal occult blood test (FOBT) of the stool. You may have this test every year starting at age 34. Flexible sigmoidoscopy or colonoscopy. You may have a sigmoidoscopy every 5 years or a colonoscopy every 10 years starting at age 35. Prostate cancer screening. Recommendations will vary depending on your family history and other risks. Hepatitis C blood test. Hepatitis B blood test. Sexually transmitted disease (STD) testing. Diabetes screening. This is done by checking your blood sugar (glucose) after you have not eaten for a while (fasting). You may have this done every 1-3 years. Abdominal aortic aneurysm (AAA) screening. You may need this if you are a current or former smoker. Osteoporosis. You may be screened starting at age 46 if you are at high risk. Talk with your health care provider about your test results, treatment options, and if necessary, the need for more tests. Vaccines  Your health care provider may recommend certain vaccines, such as: Influenza vaccine. This is  recommended every year. Tetanus, diphtheria, and acellular pertussis (Tdap, Td) vaccine. You may need a Td booster every 10 years. Zoster vaccine. You may need this after age 61. Pneumococcal 13-valent conjugate (PCV13) vaccine. One dose is recommended after age 14. Pneumococcal polysaccharide (PPSV23) vaccine. One dose is recommended after age 40. Talk to your health care provider about which screenings and vaccines you need and how often you need them. This information is not intended to replace advice given to you by your health  care provider. Make sure you discuss any questions you have with your health care provider. Document Released: 01/12/2016 Document Revised: 09/04/2016 Document Reviewed: 10/17/2015 Elsevier Interactive Patient Education  2017 [employer] Prevention in the Home Falls can cause injuries. They can happen to people of all ages. There are many things you can do to make your home safe and to help prevent falls. What can I do on the outside of my home? Regularly fix the edges of walkways and driveways and fix any cracks. Remove anything that might make you trip as you walk through a door, such as a raised step or threshold. Trim any bushes or trees on the path to your home. Use bright outdoor lighting. Clear any walking paths of anything that might make someone trip, such as rocks or tools. Regularly check to see if handrails are loose or broken. Make sure that both sides of any steps have handrails. Any raised decks and porches should have guardrails on the edges. Have any leaves, snow, or ice cleared regularly. Use sand or salt on walking paths during winter. Clean up any spills in your garage right away. This includes oil or grease spills. What can I do in the bathroom? Use night lights. Install grab bars by the toilet and in the tub and shower. Do not use towel bars as grab bars. Use non-skid mats or decals in the tub or shower. If you need to sit down in  the shower, use a plastic, non-slip stool. Keep the floor dry. Clean up any water that spills on the floor as soon as it happens. Remove soap buildup in the tub or shower regularly. Attach bath mats securely with double-sided non-slip rug tape. Do not have throw rugs and other things on the floor that can make you trip. What can I do in the bedroom? Use night lights. Make sure that you have a light by your bed that is easy to reach. Do not use any sheets or blankets that are too big for your bed. They should not hang down onto the floor. Have a firm chair that has side arms. You can use this for support while you get dressed. Do not have throw rugs and other things on the floor that can make you trip. What can I do in the kitchen? Clean up any spills right away. Avoid walking on wet floors. Keep items that you use a lot in easy-to-reach places. If you need to reach something above you, use a strong step stool that has a grab bar. Keep electrical cords out of the way. Do not use floor polish or wax that makes floors slippery. If you must use wax, use non-skid floor wax. Do not have throw rugs and other things on the floor that can make you trip. What can I do with my stairs? Do not leave any items on the stairs. Make sure that there are handrails on both sides of the stairs and use them. Fix handrails that are broken or loose. Make sure that handrails are as long as the stairways. Check any carpeting to make sure that it is firmly attached to the stairs. Fix any carpet that is loose or worn. Avoid having throw rugs at the top or bottom of the stairs. If you do have throw rugs, attach them to the floor with carpet tape. Make sure that you have a light switch at the top of the stairs and the bottom of the stairs. If you do not have  them, ask someone to add them for you. What else can I do to help prevent falls? Wear shoes that: Do not have high heels. Have rubber bottoms. Are comfortable  and fit you well. Are closed at the toe. Do not wear sandals. If you use a stepladder: Make sure that it is fully opened. Do not climb a closed stepladder. Make sure that both sides of the stepladder are locked into place. Ask someone to hold it for you, if possible. Clearly mark and make sure that you can see: Any grab bars or handrails. First and last steps. Where the edge of each step is. Use tools that help you move around (mobility aids) if they are needed. These include: Canes. Walkers. Scooters. Crutches. Turn on the lights when you go into a dark area. Replace any light bulbs as soon as they burn out. Set up your furniture so you have a clear path. Avoid moving your furniture around. If any of your floors are uneven, fix them. If there are any pets around you, be aware of where they are. Review your medicines with your doctor. Some medicines can make you feel dizzy. This can increase your chance of falling. Ask your doctor what other things that you can do to help prevent falls. This information is not intended to replace advice given to you by your health care provider. Make sure you discuss any questions you have with your health care provider. Document Released: 10/12/2009 Document Revised: 05/23/2016 Document Reviewed: 01/20/2015 Elsevier Interactive Patient Education  2017 [employer].

## 2023-02-04 NOTE — Progress Notes (Signed)
I connected with  Bruce Ball on 02/04/23 by a audio enabled telemedicine application and verified that I am speaking with the correct person using two identifiers.  Patient Location: Home  Provider Location: Office/Clinic  I discussed the limitations of evaluation and management by telemedicine. The patient expressed understanding and agreed to proceed.   Subjective:   Bruce Ball is a 77 y.o. male who presents for Medicare Annual/Subsequent preventive examination.  Review of Systems     Cardiac Risk Factors include: advanced age (>56mn, >>62women);dyslipidemia;hypertension;obesity (BMI >30kg/m2);male gender     Objective:    Today's Vitals   02/04/23 1409  Weight: 240 lb (108.9 kg)   Body mass index is 33.47 kg/m.     02/04/2023    2:13 PM 11/13/2022   10:02 AM 11/11/2022    5:40 AM 10/18/2022    2:23 PM 09/17/2022    9:21 AM 03/06/2022   11:59 AM 01/25/2022   11:16 AM  Advanced Directives  Does Patient Have a Medical Advance Directive? Yes No No No Yes Yes Yes  Type of AParamed[place]of [place] will     [place] will  Copy of [place] Chart? No - copy requested        Would patient like information on creating a medical advance directive?  No - Patient declined  No - Patient declined  No - Patient declined     Current Medications (verified) Outpatient Encounter Medications as of 02/04/2023  Medication Sig   acetaminophen (TYLENOL) 500 MG tablet Take 500-1,000 mg by mouth every 6 (six) hours as needed for mild pain or headache.   amLODipine (NORVASC) 2.5 MG tablet Take 1 tablet (2.5 mg total) by mouth daily.   apixaban (ELIQUIS) 2.5 MG TABS tablet Take 1 tablet (2.5 mg total) by mouth 2 (two) times daily.   atorvastatin (LIPITOR) 20 MG tablet Take 1 tablet (20 mg total) by mouth at bedtime.   carvedilol (COREG) 25 MG tablet Take 25 mg by mouth 2  (two) times daily.   cephALEXin (KEFLEX) 500 MG capsule Take 1 capsule (500 mg total) by mouth every 8 (eight) hours as needed.   cetirizine (ZYRTEC) 10 MG tablet Take 10 mg by mouth daily as needed for allergies or rhinitis.   Cholecalciferol (VITAMIN D3) 50 MCG (2000 UT) capsule Take 2,000 Units by mouth in the morning and at bedtime.    fluticasone (FLONASE) 50 MCG/ACT nasal spray Place 1-2 sprays into both nostrils daily as needed for allergies or rhinitis.   furosemide (LASIX) 20 MG tablet Take 20 mg by mouth daily as needed (if the feet and/or legs swell).   pantoprazole (PROTONIX) 40 MG tablet Take 40 mg by mouth 2 (two) times daily before a meal.   POTASSIUM PO Take 1 tablet by mouth See admin instructions. Take 1 tablet by mouth once a day ONLY WHEN TAKING FUROSEMIDE   sulfamethoxazole-trimethoprim (BACTRIM DS) 800-160 MG tablet Take 1 tablet by mouth 2 (two) times daily.   [DISCONTINUED] cefdinir (OMNICEF) 300 MG capsule Take 1 capsule (300 mg total) by mouth 2 (two) times daily.   [DISCONTINUED] cyclobenzaprine (FLEXERIL) 10 MG tablet Take 5 mg by mouth at bedtime as needed (for muscle pain- causes "hangover," so do not drive).   [DISCONTINUED] ferrous sulfate 325 (65 FE) MG tablet Take 325 mg by mouth every Monday, Wednesday, and Friday.   [DISCONTINUED] gabapentin (NEURONTIN) 300 MG capsule  Take 300 mg by mouth at bedtime as needed (FOR NERVE PAIN).   [DISCONTINUED] predniSONE (DELTASONE) 20 MG tablet Take 2 pills for 3 days, 1 pill for 4 days   [DISCONTINUED] tiZANidine (ZANAFLEX) 4 MG tablet Take 4 mg by mouth every 6 (six) hours as needed for muscle spasms.   [DISCONTINUED] traMADol (ULTRAM) 50 MG tablet Take 50 mg by mouth every 6 (six) hours as needed for moderate pain.   No facility-administered encounter medications on file as of 02/04/2023.    Allergies (verified) Tape and Lisinopril   History: Past Medical History:  Diagnosis Date   Ankylosing spondylitis (HCC)     chronic back and leg pain   Arthritis of right knee    Basal cell carcinoma    Community acquired pneumonia of right upper lobe of lung 11/05/2017   Coronary artery calcification seen on CAT scan 03/27/2021   Deafness in right ear    Essential tremor    Extrinsic asthma, unspecified 04/07/2009   PT DENIES   Femoral condyle fracture ([place]) 07/15/2019   GERD (gastroesophageal reflux disease)    Gross hematuria 06/14/2019   History of kidney stones    HYPERTENSION 10/29/2007   Neuropathy    Osteopenia    Other hyperlipidemia    Peptic ulcer    Periprosthetic fracture of shaft of femur 10/10/2021   PUD, HX OF 04/21/2008   Pulmonary emboli ([place]) 2021   noted on CT 12-2019   Stage 3 chronic kidney disease (HCC)    Thyroid nodule    Past Surgical History:  Procedure Laterality Date   BACK SURGERY     bone cancer  1970   rt femur removed and steel rod placed   [place], URETEROSCOPY AND STENT PLACEMENT Right 11/06/2017   Procedure: CYSTOSCOPY WITH  STENT PLACEMENT RIGHT;  Surgeon: Raynelle Bring, MD;  Location: [facility];  Service: Urology;  Laterality: Right;   CYSTOSCOPY WITH RETROGRADE PYELOGRAM, URETEROSCOPY AND STENT PLACEMENT Left 06/09/2019   Procedure: CYSTOSCOPY WITH RETROGRADE PYELOGRAM, URETEROSCOPY AND STENT PLACEMENT X2;  Surgeon: [name], MD;  Location: [facility];  Service: Urology;  Laterality: Left;   CYSTOSCOPY WITH RETROGRADE PYELOGRAM, URETEROSCOPY AND STENT PLACEMENT Bilateral 03/01/2020   Procedure: CYSTOSCOPY WITH RETROGRADE PYELOGRAM, URETEROSCOPY AND STENT PLACEMENT;  Surgeon: [name], MD;  Location: [facility];  Service: Urology;  Laterality: Bilateral;  75 MINS   CYSTOSCOPY/RETROGRADE/URETEROSCOPY     1 year ago at [place] in [place] Right 11/24/2017   Procedure: CYSTOSCOPY/URETEROSCOPY/ RETROGRADE/STENT REMOVAL;  Surgeon: Raynelle Bring, MD;  Location: [facility];  Service: Urology;  Laterality:  Right;   HOLMIUM LASER APPLICATION Bilateral 06/06/1274   Procedure: HOLMIUM LASER APPLICATION;  Surgeon: [name], MD;  Location: [facility];  Service: Urology;  Laterality: Bilateral;   prosatectomy     Family History  Problem Relation Age of Onset   COPD Father    Hypertension Father    Prostate cancer Brother    Stomach cancer Brother        on chemo in 2022- in 72s developed   Depression Brother        suicide after brain stem injury after accident   Stroke Brother        11 years old   Esophageal cancer Neg Hx    Pancreatic cancer Neg Hx    Colon cancer Neg Hx    Social History   Socioeconomic History   Marital status: Married    Spouse name:  Not on file   Number of children: 2   Years of education: Not on file   Highest education level: Not on file  Occupational History   Occupation: Retired Optometrist  Tobacco Use   Smoking status: Former    Packs/day: 1.00    Years: 17.00    Total pack years: 17.00    Types: Cigarettes    Start date: 1963    Quit date: 12/30/1978    Years since quitting: 44.1   Smokeless tobacco: Never  Vaping Use   Vaping Use: Never used  Substance and Sexual Activity   Alcohol use: No   Drug use: No   Sexual activity: Not on file  Other Topics Concern   Not on file  Social History Narrative   ** Merged History Encounter **       Social Determinants of Health   Financial Resource Strain: Low Risk  (02/04/2023)   Overall Financial Resource Strain (CARDIA)    Difficulty of Paying Living Expenses: Not hard at all  Food Insecurity: No Food Insecurity (02/04/2023)   Hunger Vital Sign    Worried About Running Out of Food in the Last Year: Never true    [place] in the Last Year: Never true  Transportation Needs: No Transportation Needs (02/04/2023)   PRAPARE - Hydrologist (Medical): No    Lack of Transportation (Non-Medical): No  Physical Activity: Insufficiently Active (01/25/2022)   Exercise Vital  Sign    Days of Exercise per Week: 2 days    Minutes of Exercise per Session: 50 min  Stress: No Stress Concern Present (01/31/2023)   [place]    Feeling of Stress : Not at all  Social Connections: Unknown (01/31/2023)   Social Connection and Isolation Panel [NHANES]    Frequency of Communication with Friends and Family: Not on file    Frequency of Social Gatherings with Friends and Family: Not on file    Attends Religious Services: Never    Marine scientist or Organizations: Not on file    Attends Archivist Meetings: Never    Marital Status: Not on file    Tobacco Counseling Counseling given: Not Answered   Clinical Intake:  Pre-visit preparation completed: Yes  Pain : No/denies pain     BMI - recorded: 33.47 Nutritional Risks: None Diabetes: No  How often do you need to have someone help you when you read instructions, pamphlets, or other written materials from your doctor or pharmacy?: 1 - Never  Diabetic?no   Interpreter Needed?: No  Information entered by :: Charlott Rakes, LPN (pt  stated no changes from previous date checked in)   Activities of Daily Living    01/31/2023    8:48 AM 11/13/2022   10:02 AM  In your present state of health, do you have any difficulty performing the following activities:  Hearing? 1   Comment hearing aids   Vision? 0   Difficulty concentrating or making decisions? 1   Comment pt stated   Walking or climbing stairs? 1   Comment back and mobilty   Dressing or bathing? 0   Doing errands, shopping? 0 0  Preparing Food and eating ? N   Using the Toilet? N   In the past six months, have you accidently leaked urine? Y   Comment incontient of urine   Do you have problems with loss of bowel control? N  Managing your Medications? Y   Comment wife   Managing your Finances? N   Housekeeping or managing your Housekeeping? Y     Patient Care  Team: [name], MD as PCP - General (Family Medicine) Sueanne Margarita, MD as PCP - Cardiology (Cardiology) [facility], [facility] as Consulting Physician Jovita Gamma, MD as Consulting Physician (Neurosurgery) Clinic, [facility] as Consulting Physician [name], MD as Consulting Physician (Urology) Lyndee Hensen, PT as Physical Therapist (Physical Therapy) [name], MD (Family Medicine) Edythe Clarity, [facility] (Pharmacist)  Indicate any recent Medical Services you may have received from other than [facility] providers in the past year (date may be approximate).     Assessment:   This is a routine wellness examination for Kaiyu.  Hearing/Vision screen Hearing Screening - Comments:: Pt wears hearing aids  [facility] Scre[facility] - Comments:: [facility]   Dietary issues and exercise activities discussed: Current Exercise Habits: The patient does not participate in regular exercise at present   Goals Addressed             This Visit's Progress    Patient Stated       None at this time        Depression Screen    02/04/2023    2:14 PM 10/28/2022    2:02 PM 09/16/2022   11:15 AM 01/25/2022   11:07 AM 08/28/2021    8:25 AM 06/05/2021    8:41 AM 04/03/2021    4:16 PM  PHQ 2/9 Scores  PHQ - 2 Score 0 0 0 0 0 4 0  PHQ- 9 Score      15     Fall Risk    01/31/2023    8:48 AM 09/16/2022   11:15 AM 01/25/2022   11:09 AM 08/28/2021    8:24 AM 05/04/2020    2:16 PM  Fall Risk   Falls in the past year? '1 1 1 1 1  '$ Number falls in past yr: '1 1 1 1 1  '$ Injury with Fall? '1 1 1 1   '$ Comment followed up at hospital  right femur    Risk for fall due to : History of fall(s);Impaired balance/gait;Impaired mobility;Impaired vision Other (Comment) Impaired balance/gait;Impaired mobility;History of fall(s) History of fall(s)   Risk for fall due to: Comment  [place] [address] out of bed twice and fell in bathroom.     Follow up Falls prevention  discussed Falls evaluation completed Falls prevention discussed Falls evaluation completed     FALL RISK PREVENTION PERTAINING TO THE HOME:  Any stairs in or around the home? Yes  If so, are there any without handrails? No  Home free of loose throw rugs in walkways, pet beds, electrical cords, etc? Yes  Adequate lighting in your home to reduce risk of falls? Yes   ASSISTIVE DEVICES UTILIZED TO PREVENT FALLS:  Life alert? Yes  Use of a cane, walker or w/c? Yes  Grab bars in the bathroom? Yes  Shower chair or bench in shower? Yes  Elevated toilet seat or a handicapped toilet? Yes   TIMED UP AND GO:  Was the test performed? No .   Cognitive Function:        02/04/2023    2:19 PM 11/29/2019    3:57 PM  6CIT Screen  What Year? 0 points 0 points  What month? 0 points 0 points  What time? 0 points 0 points  Count back from 20  0 points 0 points  Months in reverse 0 points 0 points  Repeat phrase 0 points 4 points  Total Score 0 points 4 points    Immunizations Immunization History  Administered Date(s) Administered   Fluad Quad(high Dose 65+) 10/07/2022   Influenza Split 12/30/2013, 11/14/2015, 12/13/2015, 09/16/2018   Influenza, High Dose Seasonal PF 09/15/2012, 11/06/2016, 11/26/2017, 09/16/2018, 10/01/2019, 10/14/2019, 10/02/2020, 09/28/2021   Influenza-Unspecified 12/30/2013, 12/13/2015, 09/16/2018   PFIZER Comirnaty(Gray Top)Covid-19 Tri-Sucrose Vaccine 01/21/2020, 02/11/2020   PFIZER(Purple Top)SARS-COV-2 Vaccination 12/31/2019, 01/21/2020, 02/11/2020, 10/02/2020, 04/13/2021   Pfizer Covid-19 Vaccine Bivalent Booster 87yr & up 09/28/2021   Pneumococcal Conjugate-13 09/15/2012, 05/19/2014   Pneumococcal-Unspecified 09/15/2012, 05/19/2014   Rsv, Bivalent, Protein Subunit Rsvpref,pf ([place]ans Lance 11/07/2022   Td 03/23/2015   Tdap 08/30/2005, 03/23/2015, 02/14/2016   Zoster Recombinat (Shingrix) 11/28/2020   Zoster, Live 11/19/2013    TDAP status: Up to date  Flu  Vaccine status: Up to date  Pneumococcal vaccine status: Up to date  Covid-19 vaccine status: Completed vaccines  Qualifies for Shingles Vaccine? Yes   Zostavax completed Yes   Shingrix Completed?: Yes  Screening Tests Health Maintenance  Topic Date Due   COVID-19 Vaccine (9 - 2023-24 season) 08/30/2022   Zoster Vaccines- Shingrix (2 of 2) 02/27/2023 (Originally 01/23/2021)   Pneumonia Vaccine 77 Years old (2 - PPSV23 or PCV20) 03/08/2023 (Originally 05/20/2015)   Medicare Annual Wellness (AWV)  02/05/2024   DTaP/Tdap/Td (5 - Td or Tdap) 02/13/2026   COLONOSCOPY (Pts 45-480yrInsurance coverage will need to be confirmed)  09/26/2026   INFLUENZA VACCINE  Completed   Hepatitis C Screening  Completed   HPV VACCINES  Aged Out    Health Maintenance  Health Maintenance Due  Topic Date Due   COVID-19 Vaccine (9 - 2023-24 season) 08/30/2022    Colorectal cancer screening: Type of screening: Colonoscopy. Completed 9/828/22. Repeat every 5 years   Additional Screening:  Hepatitis C Screening:  Completed 07/24/16  Vision Screening: Recommended annual ophthalmology exams for early detection of glaucoma and other disorders of the eye. Is the patient up to date with their annual eye exam?  Yes  Who is the provider or what is the name of the office in which the patient attends [facility] exams[facility] If pt is not established with a provider, would they like to be referred to a provider to establish care? No .   Dental Screening: Recommended annual dental exams for proper oral hygiene  Community Resource Referral / Chronic Care Management: CRR required this visit?  No   CCM required this visit?  No      Plan:     I have personally reviewed and noted the following in the patient's chart:   Medical and social history Use of alcohol, tobacco or illicit drugs  Current medications and supplements including opioid prescriptions. Patient is not currently taking opioid  prescriptions. Functional ability and status Nutritional status Physical activity Advanced directives List of other physicians Hospitalizations, surgeries, and ER visits in previous 12 months Vitals Screenings to include cognitive, depression, and falls Referrals and appointments  In addition, I have reviewed and discussed with patient certain preventive protocols, quality metrics, and best practice recommendations. A written personalized care plan for preventive services as well as general preventive health recommendations were provided to patient.     TiWillette BraceLPN   2/02/02/2682 Nurse Notes: none

## 2023-02-10 DIAGNOSIS — C61 Malignant neoplasm of prostate: Secondary | ICD-10-CM | POA: Diagnosis not present

## 2023-02-10 DIAGNOSIS — R310 Gross hematuria: Secondary | ICD-10-CM | POA: Diagnosis not present

## 2023-02-10 DIAGNOSIS — N3021 Other chronic cystitis with hematuria: Secondary | ICD-10-CM | POA: Diagnosis not present

## 2023-02-10 DIAGNOSIS — N202 Calculus of kidney with calculus of ureter: Secondary | ICD-10-CM | POA: Diagnosis not present

## 2023-02-18 ENCOUNTER — Other Ambulatory Visit: Payer: Self-pay | Admitting: Urology

## 2023-02-19 ENCOUNTER — Ambulatory Visit (INDEPENDENT_AMBULATORY_CARE_PROVIDER_SITE_OTHER): Payer: Medicare Other | Admitting: Student in an Organized Health Care Education/Training Program

## 2023-02-19 VITALS — BP 134/81 | HR 81 | Temp 97.3°F

## 2023-02-19 DIAGNOSIS — J0101 Acute recurrent maxillary sinusitis: Secondary | ICD-10-CM

## 2023-02-19 LAB — RSV RAPID, ONSITE UWPC: RSV RAPID POCT: NEGATIVE

## 2023-02-19 LAB — POC COVID-19 QUAL RAPID PCR, ONSITE (UWNC): COVID-19 Qualitative Rapid PCR Result (UWNC): NEGATIVE

## 2023-02-19 LAB — INFLUENZA ASSAY RAPID, ONSITE
Influenza Viral Ag-A: NEGATIVE
Influenza Viral Ag-B: NEGATIVE

## 2023-02-19 MED ORDER — PREDNISONE 20 MG OR TABS
40.0000 mg | ORAL_TABLET | Freq: Every day | ORAL | 0 refills | Status: AC
Start: 2023-02-19 — End: 2023-02-24

## 2023-02-19 NOTE — Patient Instructions (Signed)
Start using flonase daily    You can do the prednisone course as prescribed

## 2023-02-19 NOTE — Progress Notes (Signed)
Historian: patient  Interpreter used: N/A    Chief Complaint   Patient presents with    Sinus Problem     Sinus problem, chronic sinus infections, having surgery in April for impacted sinuses, took antibiotics last month for a sinus infection       Subjective:   Paul Schneider is a 77 year old male who presents on 02/19/2023 for the following concerns:    Reports h/o chronic sinus infections   Tx with amoxicillin  course on 2/6  Feeling congested in sinus  Runny nose   Has been going on for about a month  Has had problems with this for over a year   Has had amoxicillin recently for infection from route canal this did not affect symptoms  Hasn't used nasal sprays  Using zyrtec now not flonase   No sinus rinses   Does not endorse: CP, SOB, fevers, chills, nausea, vomiting, diarrhea    I reviewed the patient's recorded medical history and confirmed the medications, problem list, allergies, and past medical history.    Review of Systems   Except as stated above in HPI, a complete ROS was conducted and otherwise negative    Objective:   Vitals: BP 134/81   Pulse 81   Temp 36.3 C   SpO2 95%   General: no acute distress  Lungs: Clear to auscultation bilaterally, no wheezes rhonchi or crackles  Heart: Regular rate and rhythm no murmurs  ENT: TMs are clear bilaterally.  Oropharynx is clear.  Rhinitis prior  Ext: no peripheral edema  Neuro:  Grossly normal to observation        Assessment and Plan:     Paul Schneider was seen today for sinus problem.    Acute recurrent maxillary sinusitis  Patient is clinically stable, hemodynamically stable in no acute distress.  Patient has history of chronic recurrent maxillary sinusitis- not at goal.  Has upcoming sinus surgery planned for this.  Patient has had significant improvement with prednisone before.  Discussed risks and benefits of prednisone, given severe symptoms and patient centered discussion with patient that he would prefer prednisone treatment reasonable to do short course of  prednisone.  Recommend nasal sinus rinses and nasal steroids.  Has recently been treated with antibiotics and would defer further antibiotics at this point as less likely acute bacterial rhinosinusitis.  Recommend follow-up with patient's ENT.  Patient would like testing today for viruses which were performed and all negative.  Orders Placed This Encounter    Paul Schneider     Order Specific Question:   How many Paul Schneider labels should print?     Answer:   Paul Schneider Specific Question:   How many Paul Schneider labels should print?     Answer:   3    RSV Rapid, Onsite [facility]    POC COVID-19 Qual Rapid PCR, Onsite ([facility])    POC Rapid Influenza    predniSONE 20 MG tablet     Sig: Take 2 tablets (40 mg) by mouth daily for 5 days.     Dispense:  10 tablet     Refill:  0         Other orders  -     ZEBRA LABELS         Discussed likely dx with pt, risks/benefits of recommended tx plan(s) as well as return precautions for ED and/or PCP based on symptom(s) and severity. Pt expressed understanding and was amenable to  plan. Pt had all questions answered prior to visit ending.    [name], MD  [facility] MEDICINE URGENT CARE AT [facility]  [place], [place] [address]  [phone]

## 2023-03-02 ENCOUNTER — Encounter: Payer: Self-pay | Admitting: Family Medicine

## 2023-03-07 ENCOUNTER — Encounter: Payer: Self-pay | Admitting: Cardiology

## 2023-03-10 NOTE — Telephone Encounter (Signed)
Called patient to respond to a my chart message. Patient's daughter contact us through patient's My Chart to notify us that the patient's heart rate is dropping into the 30's. He has been wearing an Apple watch.  Patient told me that when he checked an EKG with his watch it notified him that he was in an abnormal rhythm.   His daughter states in her message he has been experiencing dizziness. Patient reports feeling well today but rate is often less than 40. He did not complain of dizziness.   Scheduled to see a provider on 03/17/23. Advised patient if he starts experiencing symptoms such as dizziness, SOB or chest pain to go the the ED for evaluation. Patient verbalized understanding and had no questions.

## 2023-03-17 ENCOUNTER — Ambulatory Visit: Payer: Medicare Other | Attending: Cardiovascular Disease

## 2023-03-17 ENCOUNTER — Encounter: Payer: Self-pay | Admitting: Cardiovascular Disease

## 2023-03-17 ENCOUNTER — Ambulatory Visit: Payer: Medicare Other | Attending: Cardiovascular Disease | Admitting: Cardiovascular Disease

## 2023-03-17 VITALS — BP 141/77 | HR 64 | Ht 71.0 in | Wt 240.0 lb

## 2023-03-17 DIAGNOSIS — Z1329 Encounter for screening for other suspected endocrine disorder: Secondary | ICD-10-CM | POA: Insufficient documentation

## 2023-03-17 DIAGNOSIS — I493 Ventricular premature depolarization: Secondary | ICD-10-CM

## 2023-03-17 DIAGNOSIS — R6889 Other general symptoms and signs: Secondary | ICD-10-CM | POA: Insufficient documentation

## 2023-03-17 MED ORDER — ATENOLOL 50 MG PO TABS: 50.0000 mg | ORAL_TABLET | Freq: Two times a day (BID) | ORAL | 2 refills | Status: DC

## 2023-03-17 NOTE — Progress Notes (Unsigned)
Enrolled for [employer] to mail a ZIO XT long term holter monitor to the patients address on file.  

## 2023-03-17 NOTE — Progress Notes (Signed)
Electrophysiology Office Note:    Date:  03/17/2023   ID:  Bruce Ball, DOB May 25, 1946, MRN [id number]  PCP:  [name], MD   [facility] Cardiologist:  Fransico Him, MD     Referring MD: [name], MD   History of Present Illness:    Bruce Ball is a 77 y.o. male with a hx listed below, significant for pulmonary embolism, stage IIIa chronic kidney disease, hypertension, hyperlipidemia, referred for arrhythmia management.  He has recently been having dizziness.  These episodes are occurring paroxysmally.  He has noticed a correlation between symptoms and low heart rates detected by his Apple watch.  Past Medical History:  Diagnosis Date   Ankylosing spondylitis ([place])    chronic back and leg pain   Arthritis of right knee    Basal cell carcinoma    Community acquired pneumonia of right upper lobe of lung 11/05/2017   Coronary artery calcification seen on CAT scan 03/27/2021   Deafness in right ear    Essential tremor    Extrinsic asthma, unspecified 04/07/2009   PT DENIES   Femoral condyle fracture ([place]) 07/15/2019   GERD (gastroesophageal reflux disease)    Gross hematuria 06/14/2019   History of kidney stones    HYPERTENSION 10/29/2007   Neuropathy    Osteopenia    Other hyperlipidemia    Peptic ulcer    Periprosthetic fracture of shaft of femur 10/10/2021   PUD, HX OF 04/21/2008   Pulmonary emboli ([place]) 2021   noted on CT 12-2019   Stage 3 chronic kidney disease (HCC)    Thyroid nodule     Past Surgical History:  Procedure Laterality Date   BACK SURGERY     bone cancer  1970   rt femur removed and steel rod placed   [place], URETEROSCOPY AND STENT PLACEMENT Right 11/06/2017   Procedure: CYSTOSCOPY WITH  STENT PLACEMENT RIGHT;  Surgeon: Raynelle Bring, MD;  Location: [facility];  Service: Urology;  Laterality: Right;   CYSTOSCOPY WITH RETROGRADE PYELOGRAM, URETEROSCOPY AND STENT PLACEMENT Left  06/09/2019   Procedure: CYSTOSCOPY WITH RETROGRADE PYELOGRAM, URETEROSCOPY AND STENT PLACEMENT X2;  Surgeon: [name], MD;  Location: [facility];  Service: Urology;  Laterality: Left;   CYSTOSCOPY WITH RETROGRADE PYELOGRAM, URETEROSCOPY AND STENT PLACEMENT Bilateral 03/01/2020   Procedure: CYSTOSCOPY WITH RETROGRADE PYELOGRAM, URETEROSCOPY AND STENT PLACEMENT;  Surgeon: [name], MD;  Location: [facility];  Service: Urology;  Laterality: Bilateral;  75 MINS   CYSTOSCOPY/RETROGRADE/URETEROSCOPY     1 year ago at [place] in [place] Right 11/24/2017   Procedure: CYSTOSCOPY/URETEROSCOPY/ RETROGRADE/STENT REMOVAL;  Surgeon: Raynelle Bring, MD;  Location: [facility];  Service: Urology;  Laterality: Right;   HOLMIUM LASER APPLICATION Bilateral [id number]   Procedure: HOLMIUM LASER APPLICATION;  Surgeon: [name], MD;  Location: [facility];  Service: Urology;  Laterality: Bilateral;   prosatectomy      Current Medications: Current Meds  Medication Sig   acetaminophen (TYLENOL) 500 MG tablet Take 500-1,000 mg by mouth every 6 (six) hours as needed for mild pain or headache.   amLODipine (NORVASC) 2.5 MG tablet Take 1 tablet (2.5 mg total) by mouth daily.   apixaban (ELIQUIS) 2.5 MG TABS tablet Take 1 tablet (2.5 mg total) by mouth 2 (two) times daily.   atorvastatin (LIPITOR) 20 MG tablet Take 1 tablet (20 mg total) by mouth at bedtime.   carvedilol (COREG) 25 MG tablet Take 25 mg by  mouth 2 (two) times daily.   cephALEXin (KEFLEX) 500 MG capsule Take 1 capsule (500 mg total) by mouth every 8 (eight) hours as needed.   cetirizine (ZYRTEC) 10 MG tablet Take 10 mg by mouth daily as needed for allergies or rhinitis.   Cholecalciferol (VITAMIN D3) 50 MCG (2000 UT) capsule Take 2,000 Units by mouth in the morning and at bedtime.    fluticasone (FLONASE) 50 MCG/ACT nasal spray Place 1-2 sprays into both nostrils daily as needed for allergies or rhinitis.   furosemide  (LASIX) 20 MG tablet Take 20 mg by mouth daily as needed (if the feet and/or legs swell).   pantoprazole (PROTONIX) 40 MG tablet Take 40 mg by mouth 2 (two) times daily before a meal.   POTASSIUM PO Take 1 tablet by mouth See admin instructions. Take 1 tablet by mouth once a day ONLY WHEN TAKING FUROSEMIDE   sulfamethoxazole-trimethoprim (BACTRIM DS) 800-160 MG tablet Take 1 tablet by mouth 2 (two) times daily.     Allergies:   Tape and Lisinopril   Social and Family History: Reviewed in Epic  ROS:   Please see the history of present illness.    All other systems reviewed and are negative.  EKGs/Labs/Other Studies Reviewed Today:    Echocardiogram:  TTE 09/20/2022  1. Left ventricular ejection fraction, by estimation, is 65 to 70%. The  left ventricle has normal function. The left ventricle has no regional  wall motion abnormalities. Left ventricular diastolic parameters are  indeterminate.   2. Right ventricular systolic function is normal. The right ventricular  size is normal.   3. Left atrial size was mildly dilated.   4. The mitral valve is normal in structure. Trivial mitral valve  regurgitation. No evidence of mitral stenosis.   5. The aortic valve is tricuspid. There is mild calcification of the  aortic valve. There is mild thickening of the aortic valve. Aortic valve  regurgitation is trivial. Aortic valve sclerosis is present, with no  evidence of aortic valve stenosis.   Monitors:  ordered  Advanced imaging:  CT 05/31/21 1. Coronary calcium score of 152. This was 43rd percentile for age-, race-, and sex-matched controls. 2. Extensive aortic arch and descending aorta atherosclerosis. Aortic valve leaflet calcifications  EKG:  Last EKG results: today sinus rhythm with frequent PVCs and PACs   Recent Labs: 09/16/2022: Pro B Natriuretic peptide (BNP) 81.0 11/11/2022: TSH 0.761 11/13/2022: Magnesium 2.1 11/27/2022: ALT 22; BUN 25; Creatinine, Ser 1.42;  Hemoglobin 15.0; Platelets 227.0; Potassium 4.7; Sodium 144     Physical Exam:    VS:  BP (!) 141/77   Pulse 64   Ht 5\' 11"  (1.803 m)   Wt 240 lb (108.9 kg)   SpO2 98%   BMI 33.47 kg/m     Wt Readings from Last 3 Encounters:  03/17/23 240 lb (108.9 kg)  02/04/23 240 lb (108.9 kg)  01/28/23 240 lb (108.9 kg)     GEN:  Well nourished, well developed in no acute distress CARDIAC: irregular rhythm, no murmurs, rubs, gallops.   He was feeling dizzy while I auscultated his heart; he was having frequent ectopy.   RESPIRATORY:  Normal work of breathing MUSCULOSKELETAL: no edema    ASSESSMENT & PLAN:    PVCs - non-perfusing PVCs are resulting in low heart rates, likely causing dizziness - check BMET, thyroid - Will switch from carvedilol to atenolol 50 - try magnesium taurate 400 mg daily - place 5 day monitor to quantify PVCs  and correlate with symptoms  History of PE - on apixaban 5mg  BID        Medication Adjustments/Labs and Tests Ordered: Current medicines are reviewed at length with the patient today.  Concerns regarding medicines are outlined above.  No orders of the defined types were placed in this encounter.  No orders of the defined types were placed in this encounter.    Signed, Melida Quitter, MD  03/17/2023 2:52 PM    [place]

## 2023-03-17 NOTE — Patient Instructions (Signed)
Medication Instructions:  Your physician has recommended you make the following change in your medication:   1) STOP carvedilol 2) START atenolol 50mg  twice daily 3) START magnesium taurate 400mg  daily (you can purchase this over the counter at any pharmacy, or online at [employer])  *If you need a refill on your cardiac medications before your next appointment, please call your pharmacy*  Lab Work: TODAY: BMET, TSH, Magnesium level  Testing/Procedures: Your physician has requested that you wear a Zio heart monitor for 7 days. This will be mailed to your home with instructions on how to apply the monitor and how to return it when finished. Please allow 2 weeks after returning the heart monitor before our office calls you with the results.   Follow-Up: At [facility], you and your health needs are our priority.  As part of our continuing mission to provide you with exceptional heart care, we have created designated Provider Care Teams.  These Care Teams include your primary Cardiologist (physician) and Advanced Practice Providers (APPs -  Physician Assistants and Nurse Practitioners) who all work together to provide you with the care you need, when you need it.  Your next appointment:   5-6 week(s)  Provider:   You may see Melida Quitter, MD or one of the following Advanced Practice Providers on your designated Care Team:   Tommye Standard, [place] Legrand Como "Jonni Sanger" Casey, [place] Mamie Levers, NP  Other Instructions [place] Monitor Instructions     Your physician has requested you wear a ZIO patch monitor for 7 days.  This is a single patch monitor. [employer] supplies one patch monitor per enrollment. Additional  stickers are not available. Please do not apply patch if you will be having a Nuclear Stress Test,  Echocardiogram, Cardiac CT, MRI, or Chest Xray during the period you would be wearing the  monitor. The patch cannot be worn during these tests. You cannot remove and  re-apply the  ZIO XT patch monitor.  Your ZIO patch monitor will be mailed 3 day USPS to your address on file. It may take 3-5 days  to receive your monitor after you have been enrolled.  Once you have received your monitor, please review the enclosed instructions. Your monitor  has already been registered assigning a specific monitor serial # to you.     Billing and Patient Assistance Program Information     We have supplied [employer] with any of your insurance information on file for billing purposes.  [employer] offers a sliding scale Patient Assistance Program for patients that do not have  insurance, or whose insurance does not completely cover the cost of the ZIO monitor.  You must apply for the Patient Assistance Program to qualify for this discounted rate.  To apply, please call [employer] at [phone], select option 4, select option 2, ask to apply for  Patient Assistance Program. Theodore Demark will ask your household income, and how many people  are in your household. They will quote your out-of-pocket cost based on that information.  [employer] will also be able to set up a 55-month, interest-free payment plan if needed.     Applying the monitor     Shave hair from upper left chest.  Hold abrader disc by orange tab. Rub abrader in 40 strokes over the upper left chest as  indicated in your monitor instructions.  Clean area with 4 enclosed alcohol pads. Let dry.  Apply patch as indicated in monitor instructions. Patch will be placed  under collarbone on left  side of chest with arrow pointing upward.  Rub patch adhesive wings for 2 minutes. Remove white label marked "1". Remove the white  label marked "2". Rub patch adhesive wings for 2 additional minutes.  While looking in a mirror, press and release button in center of patch. A small green light will  flash 3-4 times. This will be your only indicator that the monitor has been turned on.  Do not shower for the first 24 hours. You may shower  after the first 24 hours.  Press the button if you feel a symptom. You will hear a small click. Record Date, Time and  Symptom in the Patient Logbook.  When you are ready to remove the patch, follow instructions on the last 2 pages of Patient  Logbook. Stick patch monitor onto the last page of Patient Logbook.  Place Patient Logbook in the blue and white box. Use locking tab on box and tape box closed  securely. The blue and white box has prepaid postage on it. Please place it in the mailbox as  soon as possible. Your physician should have your test results approximately 7 days after the  monitor has been mailed back to [facility].  Call [place] at [phone] if you have questions regarding  your ZIO XT patch monitor. Call them immediately if you see an orange light blinking on your  monitor.  If your monitor falls off in less than 4 days, contact our Monitor department at [phone].  If your monitor becomes loose or falls off after 4 days call [employer] at [phone] for  suggestions on securing your monitor.

## 2023-03-18 DIAGNOSIS — L821 Other seborrheic keratosis: Secondary | ICD-10-CM | POA: Diagnosis not present

## 2023-03-18 DIAGNOSIS — D2262 Melanocytic nevi of left upper limb, including shoulder: Secondary | ICD-10-CM | POA: Diagnosis not present

## 2023-03-18 DIAGNOSIS — L820 Inflamed seborrheic keratosis: Secondary | ICD-10-CM | POA: Diagnosis not present

## 2023-03-18 DIAGNOSIS — D2261 Melanocytic nevi of right upper limb, including shoulder: Secondary | ICD-10-CM | POA: Diagnosis not present

## 2023-03-18 DIAGNOSIS — D1801 Hemangioma of skin and subcutaneous tissue: Secondary | ICD-10-CM | POA: Diagnosis not present

## 2023-03-18 DIAGNOSIS — D225 Melanocytic nevi of trunk: Secondary | ICD-10-CM | POA: Diagnosis not present

## 2023-03-18 DIAGNOSIS — L570 Actinic keratosis: Secondary | ICD-10-CM | POA: Diagnosis not present

## 2023-03-18 DIAGNOSIS — Z85828 Personal history of other malignant neoplasm of skin: Secondary | ICD-10-CM | POA: Diagnosis not present

## 2023-03-18 DIAGNOSIS — D485 Neoplasm of uncertain behavior of skin: Secondary | ICD-10-CM | POA: Diagnosis not present

## 2023-03-18 LAB — BASIC METABOLIC PANEL
BUN/Creatinine Ratio: 11 (ref 10–24)
BUN: 18 mg/dL (ref 8–27)
CO2: 22 mmol/L (ref 20–29)
Calcium: 8.9 mg/dL (ref 8.6–10.2)
Chloride: 108 mmol/L — ABNORMAL HIGH (ref 96–106)
Creatinine, Ser: 1.57 mg/dL — ABNORMAL HIGH (ref 0.76–1.27)
Glucose: 90 mg/dL (ref 70–99)
Potassium: 4.6 mmol/L (ref 3.5–5.2)
Sodium: 145 mmol/L — ABNORMAL HIGH (ref 134–144)
eGFR: 45 mL/min/{1.73_m2} — ABNORMAL LOW (ref >59)

## 2023-03-18 LAB — TSH: TSH: 1.33 u[IU]/mL (ref 0.450–4.500)

## 2023-03-18 LAB — MAGNESIUM: Magnesium: 2.1 mg/dL (ref 1.6–2.3)

## 2023-03-19 DIAGNOSIS — J3489 Other specified disorders of nose and nasal sinuses: Secondary | ICD-10-CM | POA: Diagnosis not present

## 2023-03-19 DIAGNOSIS — J324 Chronic pansinusitis: Secondary | ICD-10-CM | POA: Diagnosis not present

## 2023-03-19 DIAGNOSIS — J45909 Unspecified asthma, uncomplicated: Secondary | ICD-10-CM | POA: Diagnosis not present

## 2023-03-19 DIAGNOSIS — I129 Hypertensive chronic kidney disease with stage 1 through stage 4 chronic kidney disease, or unspecified chronic kidney disease: Secondary | ICD-10-CM | POA: Diagnosis not present

## 2023-03-19 DIAGNOSIS — K219 Gastro-esophageal reflux disease without esophagitis: Secondary | ICD-10-CM | POA: Diagnosis not present

## 2023-03-19 DIAGNOSIS — Z86711 Personal history of pulmonary embolism: Secondary | ICD-10-CM | POA: Diagnosis not present

## 2023-03-19 DIAGNOSIS — J339 Nasal polyp, unspecified: Secondary | ICD-10-CM | POA: Diagnosis not present

## 2023-03-19 DIAGNOSIS — J342 Deviated nasal septum: Secondary | ICD-10-CM | POA: Diagnosis not present

## 2023-03-19 DIAGNOSIS — N183 Chronic kidney disease, stage 3 unspecified: Secondary | ICD-10-CM | POA: Diagnosis not present

## 2023-03-19 DIAGNOSIS — J343 Hypertrophy of nasal turbinates: Secondary | ICD-10-CM | POA: Diagnosis not present

## 2023-03-19 HISTORY — PX: FUNCTIONAL ENDOSCOPIC SINUS SURGERY: SUR616

## 2023-03-23 DIAGNOSIS — I493 Ventricular premature depolarization: Secondary | ICD-10-CM | POA: Diagnosis not present

## 2023-03-27 DIAGNOSIS — J324 Chronic pansinusitis: Secondary | ICD-10-CM | POA: Diagnosis not present

## 2023-03-27 DIAGNOSIS — J339 Nasal polyp, unspecified: Secondary | ICD-10-CM | POA: Diagnosis not present

## 2023-03-27 DIAGNOSIS — Z9889 Other specified postprocedural states: Secondary | ICD-10-CM | POA: Diagnosis not present

## 2023-03-27 DIAGNOSIS — J343 Hypertrophy of nasal turbinates: Secondary | ICD-10-CM | POA: Diagnosis not present

## 2023-04-03 ENCOUNTER — Telehealth (INDEPENDENT_AMBULATORY_CARE_PROVIDER_SITE_OTHER): Payer: Medicare Other | Admitting: Family Medicine

## 2023-04-03 ENCOUNTER — Encounter: Payer: Self-pay | Admitting: Family Medicine

## 2023-04-03 VITALS — BP 168/86 | Ht 71.0 in | Wt 230.0 lb

## 2023-04-03 DIAGNOSIS — I10 Essential (primary) hypertension: Secondary | ICD-10-CM

## 2023-04-03 DIAGNOSIS — R531 Weakness: Secondary | ICD-10-CM

## 2023-04-03 DIAGNOSIS — R2689 Other abnormalities of gait and mobility: Secondary | ICD-10-CM | POA: Diagnosis not present

## 2023-04-03 NOTE — Progress Notes (Signed)
Phone [phone] Virtual visit via Video note   Subjective:  Chief complaint: Chief Complaint  Patient presents with   discuss continuing PT    Pt is wanting to discuss continuing PT.    Our team/I connected with Bruce Ball at 11:20 AM EDT by a video enabled telemedicine application (caregility through epic) and verified that I am speaking with the correct person using two identifiers. Our team/I discussed the limitations of evaluation and management by telemedicine and the availability of in person appointments.No physical exam was performed (except for noted visual exam or audio findings with Telehealth visits).   Location patient: [place] Location provider: [facility], office Persons participating in the virtual visit:  patient  Past Medical History-  Patient Active Problem List   Diagnosis Date Noted   Post-COVID syndrome 09/16/2022    Priority: High   History of pulmonary embolism 10/10/2021    Priority: High   Chronic diastolic CHF (congestive heart failure) 10/10/2021    Priority: High   CAD (coronary artery disease) 04/03/2021    Priority: High   Aortic atherosclerosis 05/17/2020    Priority: High   Idiopathic neuropathy 01/28/2020    Priority: High   Recurrent falls 01/28/2020    Priority: High   History of prostate cancer 03/08/2017    Priority: High   High risk medication use 02/28/2017    Priority: High   Ataxia 05/18/2016    Priority: High   Vestibular disequilibrium 05/18/2016    Priority: High   Deposits (accretions) on teeth 09/16/2022    Priority: Medium    Chronic pansinusitis 07/11/2022    Priority: Medium    CKD (chronic kidney disease), stage III 10/10/2021    Priority: Medium    Hyperlipidemia 10/10/2021    Priority: Medium    B12 deficiency 05/17/2020    Priority: Medium    Iliac artery aneurysm 05/17/2020    Priority: Medium    Osteoporosis 04/04/2020    Priority: Medium    GERD (gastroesophageal reflux disease) 12/22/2019     Priority: Medium    Essential tremor 12/22/2019    Priority: Medium    Kidney stone 11/05/2017    Priority: Medium    Peripheral edema 06/08/2012    Priority: Medium    Backache 04/21/2008    Priority: Medium    History of peptic ulcer disease 04/21/2008    Priority: Medium    Essential hypertension 10/29/2007    Priority: Medium    UTI due to Klebsiella species 11/11/2022    Priority: Low   Sensorineural hearing loss, bilateral 09/16/2022    Priority: Low   Chronic anticoagulation 03/26/2021    Priority: Low   Acquired coagulation disorder 12/12/2020    Priority: Low   Senile purpura 12/12/2020    Priority: Low   History of total right hip replacement 03/08/2017    Priority: Low   DDD (degenerative disc disease), thoracic 02/28/2017    Priority: Low   Lung nodule, solitary 05/24/2014    Priority: Low   Basal cell carcinoma of scalp 09/16/2022   Nontoxic single thyroid nodule 09/16/2022   Thyroid nodule 09/16/2022   Non-seasonal allergic rhinitis 06/11/2022   Abrasion of right arm 11/19/2021   Femur fracture, right 10/17/2021   Osteochondrosarcoma 10/16/2021   Meibomian gland dysfunction of right eye, unspecified eyelid 07/11/2020   Vitamin B12 deficient megaloblastic anemia 07/11/2020   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     amLODipine (NORVASC) 2.5 MG tablet Take 1 tablet (2.5 mg total) by mouth daily. 90 tablet 3   apixaban (ELIQUIS) 2.5 MG TABS tablet Take 1 tablet (2.5 mg total) by mouth 2 (two) times daily.     atenolol (TENORMIN) 50 MG tablet Take 1 tablet (50 mg total) by mouth 2 (two) times daily. 60 tablet 2   atorvastatin (LIPITOR) 20 MG tablet Take 1 tablet (20 mg total) by  mouth at bedtime. 90 tablet 3   Cholecalciferol (VITAMIN D3) 50 MCG (2000 UT) capsule Take 2,000 Units by mouth in the morning and at bedtime.      fluticasone (FLONASE) 50 MCG/ACT nasal spray Place 1-2 sprays into both nostrils daily as needed for allergies or rhinitis.     furosemide (LASIX) 20 MG tablet Take 20 mg by mouth daily as needed (if the feet and/or legs swell). (Patient not taking: Reported on 04/03/2023)     pantoprazole (PROTONIX) 40 MG tablet Take 40 mg by mouth 2 (two) times daily before a meal.     POTASSIUM PO Take 1 tablet by mouth See admin instructions. Take 1 tablet by mouth once a day ONLY WHEN TAKING FUROSEMIDE     No current facility-administered medications for this visit.     Objective:  BP (!) 168/86   Ht 5\' 11"  (1.803 m)   Wt 230 lb (104.3 kg)   BMI 32.08 kg/m  self reported vitals Gen: NAD, resting comfortably Lungs: nonlabored, normal respiratory rate  Skin: appears dry, no obvious rash     Assessment and Plan   # History of falls with generalized weakness and equilibrium issues S: At January visit we discussed potentially starting physical therapy to help reduce risk of falls and help with gait and balance training but he had an upcoming trip and was unable at that time.  Today he reaches out and has recovered from sinus surgery that hea had recently and feels ready to move forward with this.  - walker with any significant distance but mainly moving short distances- not using anything- hip felt better after prednisone he was given for sinuses  A/P: Patient with history of falls as well as equilibrium issues that are contributing-referral placed for physical therapy at [facility]  #hypertension S: medication: coreg was changed to atenolol and prior blood pressures of 130s/70s are now typically running at least 160/80- big change for him and has been even higher- had to take amlodipine once (but caused swelling). Dizziness/imbalance has persisted  despite the change.  -also on lasix as needed - hasn't needed frequently lately- last 2 weeks ago, amlodipine 2.5 mg  -had been on prednisone but blood pressure had gone up before that started- and he is no longer on prednisone and remains high BP Readings from Last 3 Encounters:  04/03/23 (!) 168/86  03/17/23 (!) 141/77  01/28/23 (!) 147/65  A/P: Patient reports increase in blood pressure/poor control since he stopped carvedilol and started atenolol in mid march 2024 with no corresponding improvement in dizziness/lightheadedness.  He has not yet let Dr. Myles Gip know so I told him I would reach out to him-my inclination would be to change back to carvedilol but we will ask for his opinion - for now continue current medications  - Patient currently only taking amlodipine if blood  pressure above 160-helpful but causes swelling and ends up having to take more Lasix  #History of PULMONARY EMBOLISM- Eliquis restarted Monday- had been off with sinus surgery   Recommended follow up: Return for as needed for new, worsening, persistent symptoms. Future Appointments  Date Time Provider Spray  04/03/2023 11:20 AM [name], MD [facility]  04/21/2023  8:00 AM Baldwin [place], PA-C [facility]  05/06/2023  3:00 PM Edythe Clarity, [facility] None  05/13/2023  1:00 PM Sueanne Margarita, MD [facility]  02/17/2024  2:00 PM [facility] ANNUAL WELLNESS VISIT 1 [facility]   Lab/Order associations:   ICD-10-CM   1. Balance disorder  R26.89 Ambulatory referral to Physical Therapy    2. Generalized weakness  R53.1 Ambulatory referral to Physical Therapy    3. Essential hypertension  I10       Return precautions advised.  [name], MD

## 2023-04-03 NOTE — Patient Instructions (Addendum)
Recommended follow up: Return for as needed for new, worsening, persistent symptoms. Including lack of improvement with blood pressure

## 2023-04-04 ENCOUNTER — Encounter: Payer: Self-pay | Admitting: Family Medicine

## 2023-04-08 DIAGNOSIS — I493 Ventricular premature depolarization: Secondary | ICD-10-CM | POA: Diagnosis not present

## 2023-04-10 DIAGNOSIS — Z9889 Other specified postprocedural states: Secondary | ICD-10-CM | POA: Diagnosis not present

## 2023-04-10 DIAGNOSIS — J324 Chronic pansinusitis: Secondary | ICD-10-CM | POA: Diagnosis not present

## 2023-04-10 DIAGNOSIS — J339 Nasal polyp, unspecified: Secondary | ICD-10-CM | POA: Diagnosis not present

## 2023-04-10 DIAGNOSIS — J343 Hypertrophy of nasal turbinates: Secondary | ICD-10-CM | POA: Diagnosis not present

## 2023-04-14 ENCOUNTER — Encounter: Payer: Self-pay | Admitting: *Deleted

## 2023-04-18 ENCOUNTER — Telehealth: Payer: Self-pay | Admitting: Pharmacist

## 2023-04-18 NOTE — Progress Notes (Signed)
Care Management & Coordination Services Pharmacy Team  Reason for Encounter: General adherence update   Contacted patient for general health update and medication adherence call.  Spoke with patient on 04/18/2023    What concerns do you have about your medications? None  The patient denies side effects with their medications.   How often do you forget or accidentally miss a dose? Never  Do you use a pillbox? Yes  Are you having any problems getting your medications from your pharmacy? No  Has the cost of your medications been a concern? No If yes, what medication and is patient assistance available or has it been applied for?  Since last visit with PharmD, no interventions have been made.   The patient has not had an ED visit since last contact.   The patient denies problems with their health.   Patient denies concerns or questions for Kristen Cardinal, PharmD at this time.   Counseled patient on: Access to carecoordination team for any cost, medication or pharmacy concerns.   Chart Updates:  Recent office visits:  02/19/2023 OV (Urgent Care) Alcide Goodness, MD; reasonable to do short course of prednisone.  Recent consult visits:  04/10/2023 OV (Otolaryngology) Clinger, Beulah Gandy, MD; Restart Flonase (Fluticasone) nasal spray DAILY.   03/27/2023 OV (Otolaryngology) Clinger, Beulah Gandy, MD; Restart Flonase (Fluticasone) nasal spray DAILY.   03/17/2023 OV (Cardiology) Mealor, Roberts Gaudy, MD; 1) STOP carvedilol 2) START atenolol  twice daily 3) START magnesium taurate  daily (you can purchase this over the counter at any pharmacy, or online at [facility] visits:  None since last Adherence Call  Medications: Outpatient Encounter Medications as of 04/18/2023  Medication Sig Note   acetaminophen (TYLENOL) 500 MG tablet Take 500-1,000 mg by mouth every 6 (six) hours as needed for mild pain or headache.    amLODipine (NORVASC) 2.5 MG tablet Take 1 tablet (2.5 mg total)  by mouth daily.    apixaban (ELIQUIS) 2.5 MG TABS tablet Take 1 tablet (2.5 mg total) by mouth 2 (two) times daily.    atenolol (TENORMIN) 50 MG tablet Take 1 tablet (50 mg total) by mouth 2 (two) times daily.    atorvastatin (LIPITOR) 20 MG tablet Take 1 tablet (20 mg total) by mouth at bedtime.    Cholecalciferol (VITAMIN D3) 50 MCG (2000 UT) capsule Take 2,000 Units by mouth in the morning and at bedtime.     fluticasone (FLONASE) 50 MCG/ACT nasal spray Place 1-2 sprays into both nostrils daily as needed for allergies or rhinitis.    furosemide (LASIX) 20 MG tablet Take 20 mg by mouth daily as needed (if the feet and/or legs swell). (Patient not taking: Reported on 04/03/2023) 11/12/2022: Taken rarely   pantoprazole (PROTONIX) 40 MG tablet Take 40 mg by mouth 2 (two) times daily before a meal.    POTASSIUM PO Take 1 tablet by mouth See admin instructions. Take 1 tablet by mouth once a day ONLY WHEN TAKING FUROSEMIDE 11/12/2022: Strength not recalled    No facility-administered encounter medications on file as of 04/18/2023.    Recent vitals BP Readings from Last 3 Encounters:  04/03/23 (!) 168/86  03/17/23 (!) 141/77  01/28/23 (!) 147/65   Pulse Readings from Last 3 Encounters:  03/17/23 64  01/28/23 (!) 54  11/28/22 (!) 59   Wt Readings from Last 3 Encounters:  04/03/23 230 lb (104.3 kg)  03/17/23 240 lb (108.9 kg)  02/04/23 240 lb (108.9 kg)   BMI Readings from  Last 3 Encounters:  04/03/23 32.08 kg/m  03/17/23 33.47 kg/m  02/04/23 33.47 kg/m    Recent lab results    Component Value Date/Time   NA 145 (H) 03/17/2023 1542   K 4.6 03/17/2023 1542   CL 108 (H) 03/17/2023 1542   CO2 22 03/17/2023 1542   GLUCOSE 90 03/17/2023 1542   GLUCOSE 89 11/27/2022 0928   BUN 18 03/17/2023 1542   CREATININE 1.57 (H) 03/17/2023 1542   CREATININE 1.67 (H) 01/25/2021 1125   CALCIUM 8.9 03/17/2023 1542    Lab Results  Component Value Date   CREATININE 1.57 (H) 03/17/2023   GFR  48.02 (L) 11/27/2022   EGFR 45 (L) 03/17/2023   GFRNONAA 58 (L) 11/13/2022   GFRAA 56 (L) 07/25/2020   Lab Results  Component Value Date/Time   HGBA1C 5.5 06/05/2021 09:35 AM   HGBA1C 5.5 02/07/2020 03:09 PM    Lab Results  Component Value Date   CHOL 101 07/11/2021   HDL 34 (A) 07/11/2021   LDLCALC 50 07/11/2021   LDLDIRECT 145.4 06/27/2011   TRIG 84 07/11/2021   CHOLHDL 3 04/03/2021    Care Gaps: Annual wellness visit in last year? Yes  Star Rating Drugs:  None   Future Appointments  Date Time Provider Department Center  04/21/2023  8:00 AM Sheilah Pigeon, PA-C [facility]  05/02/2023  8:45 AM Susy Manor, PT [facility]  05/06/2023  3:00 PM Erroll Luna, [facility] None  05/13/2023  1:00 PM Quintella Reichert, MD [facility]  02/17/2024  2:00 PM [facility] ANNUAL WELLNESS VISIT 1 [facility]   April D Calhoun, [facility] Clinical Pharmacist Assistant [phone]

## 2023-04-20 NOTE — Progress Notes (Signed)
Cardiology Office Note Date:  04/21/2023  Patient ID:  Bruce Ball, Bruce Ball 03-28-1946, MRN [id number] PCP:  Shelva Majestic, MD  Cardiologist:  Dr. Mayford Knife Electrophysiologist: Dr. Nelly Laurence    Chief Complaint:  6 week f/u  History of Present Illness: Bruce Ball is a 77 y.o. male with history of CKD (IIIa), ankylosing spondylitis, arthritis, essential tremor, GERD, HTN, PE (2021 w/R heart strain), chronic CHF (HFpEF)  Referred to Dr. Nelly Laurence 03/17/23, for management of PVS (noted via his watch), symptomatic with dizziness, coreg changed to atenolol and planned for monitor to assess burden.  Monitor with low PVC burden, + Atrial ectopy, runs, +/- PATs, longest 13.5 seconds  Saw his PMD recently to follow up on falls, and gait/equilibrium instability, planned for PT. BP was high since change to atenolol, deferred to EP.  TODAY He reports going back to the coreg for BP control. He is accompanied by his wife He feels well No CP, palpitations or cardiac awareness of any kind. No SOB, DOE He is recovered from his sinus surgery and this has made a tremendous improvement in how he feels, dizziness is markedly improved, has not started PT yet. His dizziness, was a sense of imbalance, gait instability, no near syncope, feeling faint/lightheaded, no syncope.  BPs were getting steadily 190s/90s and in d/w his PMD (who spoke with Dr. Nelly Laurence) resumed his cored, stopped atenolol.  No bleeding or signs of bleeding  They tell me that while in Bruce Ball his watch started to alert him to HR 40, he denies any associated symptoms, his wife perhaps thought there was dizziness, but he says no.  THIS is what prompted his referral to Dr. Nelly Laurence     Past Medical History:  Diagnosis Date   Ankylosing spondylitis    chronic back and leg pain   Arthritis of right knee    Basal cell carcinoma    Community acquired pneumonia of right upper lobe of lung 11/05/2017   Coronary artery calcification seen  on CAT scan 03/27/2021   Deafness in right ear    Essential tremor    Extrinsic asthma, unspecified 04/07/2009   PT DENIES   Femoral condyle fracture 07/15/2019   GERD (gastroesophageal reflux disease)    Gross hematuria 06/14/2019   History of kidney stones    HYPERTENSION 10/29/2007   Neuropathy    Osteopenia    Other hyperlipidemia    Peptic ulcer    Periprosthetic fracture of shaft of femur 10/10/2021   PUD, HX OF 04/21/2008   Pulmonary emboli 2021   noted on CT 12-2019   Stage 3 chronic kidney disease    Thyroid nodule     Past Surgical History:  Procedure Laterality Date   BACK SURGERY     bone cancer  1970   rt femur removed and steel rod placed   CYSTOSCOPY WITH RETROGRADE PYELOGRAM, URETEROSCOPY AND STENT PLACEMENT Right 11/06/2017   Procedure: CYSTOSCOPY WITH  STENT PLACEMENT RIGHT;  Surgeon: Heloise Purpura, MD;  Location: [facility];  Service: Urology;  Laterality: Right;   CYSTOSCOPY WITH RETROGRADE PYELOGRAM, URETEROSCOPY AND STENT PLACEMENT Left 06/09/2019   Procedure: CYSTOSCOPY WITH RETROGRADE PYELOGRAM, URETEROSCOPY AND STENT PLACEMENT X2;  Surgeon: Sebastian Ache, MD;  Location: [facility];  Service: Urology;  Laterality: Left;   CYSTOSCOPY WITH RETROGRADE PYELOGRAM, URETEROSCOPY AND STENT PLACEMENT Bilateral 03/01/2020   Procedure: CYSTOSCOPY WITH RETROGRADE PYELOGRAM, URETEROSCOPY AND STENT PLACEMENT;  Surgeon: Sebastian Ache, MD;  Location: [facility];  Service: Urology;  Laterality: Bilateral;  75 MINS   CYSTOSCOPY/RETROGRADE/URETEROSCOPY     1 year ago at [place] in [place]   CYSTOSCOPY/URETEROSCOPY/HOLMIUM LASER Right 11/24/2017   Procedure: CYSTOSCOPY/URETEROSCOPY/ RETROGRADE/STENT REMOVAL;  Surgeon: Heloise Purpura, MD;  Location: [facility];  Service: Urology;  Laterality: Right;   HOLMIUM LASER APPLICATION Bilateral 03/01/2020   Procedure: HOLMIUM LASER APPLICATION;  Surgeon: Sebastian Ache, MD;  Location: [facility];  Service: Urology;  Laterality: Bilateral;   prosatectomy       Current Outpatient Medications  Medication Sig Dispense Refill   acetaminophen (TYLENOL) 500 MG tablet Take 500-1,000 mg by mouth every 6 (six) hours as needed for mild pain or headache.     amLODipine (NORVASC) 2.5 MG tablet Take 1 tablet (2.5 mg total) by mouth daily. (Patient taking differently: Take 2.5 mg by mouth daily. prn) 90 tablet 3   apixaban (ELIQUIS) 2.5 MG TABS tablet Take 1 tablet (2.5 mg total) by mouth 2 (two) times daily.     atorvastatin (LIPITOR) 20 MG tablet Take 1 tablet (20 mg total) by mouth at bedtime. 90 tablet 3   carvedilol (COREG) 25 MG tablet Take 25 mg by mouth 2 (two) times daily with a meal.     Cholecalciferol (VITAMIN D3) 50 MCG (2000 UT) capsule Take 2,000 Units by mouth in the morning and at bedtime.      fluticasone (FLONASE) 50 MCG/ACT nasal spray Place 1-2 sprays into both nostrils daily as needed for allergies or rhinitis.     furosemide (LASIX) 20 MG tablet Take 20 mg by mouth daily as needed (if the feet and/or legs swell).     pantoprazole (PROTONIX) 40 MG tablet Take 40 mg by mouth 2 (two) times daily before a meal.     POTASSIUM PO Take 1 tablet by mouth See admin instructions. Take 1 tablet by mouth once a day ONLY WHEN TAKING FUROSEMIDE     PREDNISONE PO Take by mouth. Dose pack     atenolol (TENORMIN) 50 MG tablet Take 1 tablet (50 mg total) by mouth 2 (two) times daily. (Patient not taking: Reported on 04/21/2023) 60 tablet 2   No current facility-administered medications for this visit.    Allergies:   Tape and Lisinopril   Social History:  The patient  reports that he quit smoking about 44 years ago. His smoking use included cigarettes. He started smoking about 61 years ago. He has a 17.00 pack-year smoking history. He has never used smokeless tobacco. He reports that he does not drink alcohol and does not use drugs.   Family History:  The patient's family history includes COPD in his father; Depression in his brother; Hypertension in  his father; Prostate cancer in his brother; Stomach cancer in his brother; Stroke in his brother.  ROS:  Please see the history of present illness.    All other systems are reviewed and otherwise negative.   PHYSICAL EXAM:  VS:  BP 134/86   Pulse (!) 56   Ht 5\' 11"  (1.803 m)   Wt 241 lb (109.3 kg)   SpO2 98%   BMI 33.61 kg/m  BMI: Body mass index is 33.61 kg/m. Well nourished, well developed, in no acute distress HEENT: normocephalic, atraumatic Neck: no JVD, carotid bruits or masses Cardiac:  RRR; no significant murmurs, no rubs, or gallops Lungs:  CTA b/l, no wheezing, rhonchi or rales Abd: soft, nontender MS: no deformity or atrophy Ext: no edema Skin: warm and dry, no rash Neuro:  No gross deficits appreciated  Psych: euthymic mood, full affect   EKG:  Done today and reviewed by myself shows  SR 69bpm, PAcs  April 2024, monitor Sinus rhythm 43-97 bpm, avg 58 There was frequent atrial ectopy including multiple atrial runs, the longest was 13.5s  Patient had a min HR of 43 bpm, max HR of 160 bpm, and avg HR of 58 bpm. Predominant underlying rhythm was Sinus Rhythm. Slight P wave morphology changes were noted. 232 Supraventricular Tachycardia runs occurred, the run with the fastest interval  lasting 10 beats with a max rate of 160 bpm, the longest lasting 13.5 secs with an avg rate of 102 bpm. Some episodes of Supraventricular Tachycardia may be possible Atrial Tachycardia with variable block. Isolated SVEs were frequent (5.6%, 34198), SVE  Couplets were frequent (11.5%, 35390), and SVE Triplets were occasional (4.7%, 9659). Isolated VEs were rare (<1.0%), VE Couplets were rare (<1.0%), and no VE Triplets were present.   10/01/22: stress myoview  The study is normal. The study is low risk.   No ST deviation was noted.   Left ventricular function is normal. Nuclear stress EF: 60 %. The left ventricular ejection fraction is normal (55-65%). End diastolic cavity size is normal.  End systolic cavity size is normal.   Prior study not available for comparison.   Normal resting and stress perfusion. No ischemia or infarction EF 60%   09/20/22: TTE 1. Left ventricular ejection fraction, by estimation, is 65 to 70%. The  left ventricle has normal function. The left ventricle has no regional  wall motion abnormalities. Left ventricular diastolic parameters are  indeterminate.   2. Right ventricular systolic function is normal. The right ventricular  size is normal.   3. Left atrial size was mildly dilated.   4. The mitral valve is normal in structure. Trivial mitral valve  regurgitation. No evidence of mitral stenosis.   5. The aortic valve is tricuspid. There is mild calcification of the  aortic valve. There is mild thickening of the aortic valve. Aortic valve  regurgitation is trivial. Aortic valve sclerosis is present, with no  evidence of aortic valve stenosis.   Comparison(s): Changes from prior study are noted. Prior ascending aorta  measured 3.9 cm, measures 3.5 cm on current study.    Recent Labs: 09/16/2022: Pro B Natriuretic peptide (BNP) 81.0 11/27/2022: ALT 22; Hemoglobin 15.0; Platelets 227.0 03/17/2023: BUN 18; Creatinine, Ser 1.57; Magnesium 2.1; Potassium 4.6; Sodium 145; TSH 1.330  No results found for requested labs within last 365 days.   CrCl cannot be calculated (Patient's most recent lab result is older than the maximum 21 days allowed.).   Wt Readings from Last 3 Encounters:  04/21/23 241 lb (109.3 kg)  04/03/23 230 lb (104.3 kg)  03/17/23 240 lb (108.9 kg)     Other studies reviewed: Additional studies/records reviewed today include: summarized above  ASSESSMENT AND PLAN:  PVCs <1% on atenolol None on today's EKG back on coreg No symptoms of bradycardia  Follow, he will let us know of any recurrent alerts by his watch    PACs Brief poss AT Frequent isolated PACs Asymptomatic Discussed benign nature of the findings Will  follow on coreg    HTN Much improved  PE Secondary hypercoagulable state On eliquis w/his PMD   Disposition: F/u with [name] in 2 mo or so, sooner if needed    Current medicines are reviewed at length with the patient today.  The patient did not have any concerns regarding medicines.  Signed, Francis Dowse,  PA-C 04/21/2023 10:33 AM     [facility] [address] [place] [address] [phone] (office)  [phone] (fax)

## 2023-04-21 ENCOUNTER — Encounter: Payer: Self-pay | Admitting: Physician Assistant

## 2023-04-21 ENCOUNTER — Ambulatory Visit: Payer: Medicare Other | Attending: Physician Assistant | Admitting: Physician Assistant

## 2023-04-21 VITALS — BP 134/86 | HR 56 | Ht 71.0 in | Wt 241.0 lb

## 2023-04-21 DIAGNOSIS — I491 Atrial premature depolarization: Secondary | ICD-10-CM | POA: Diagnosis not present

## 2023-04-21 DIAGNOSIS — I10 Essential (primary) hypertension: Secondary | ICD-10-CM

## 2023-04-21 DIAGNOSIS — I493 Ventricular premature depolarization: Secondary | ICD-10-CM | POA: Diagnosis not present

## 2023-04-21 NOTE — Patient Instructions (Signed)
Medication Instructions:   Your physician recommends that you continue on your current medications as directed. Please refer to the Current Medication list given to you today.   *If you need a refill on your cardiac medications before your next appointment, please call your pharmacy*   Lab Work: NONE ORDERED  TODAY    If you have labs (blood work) drawn today and your tests are completely normal, you will receive your results only by: MyChart Message (if you have MyChart) OR A paper copy in the mail If you have any lab test that is abnormal or we need to change your treatment, we will call you to review the results.   Testing/Procedures: NONE ORDERED  TODAY     Follow-Up: At [facility], you and your health needs are our priority.  As part of our continuing mission to provide you with exceptional heart care, we have created designated Provider Care Teams.  These Care Teams include your primary Cardiologist (physician) and Advanced Practice Providers (APPs -  Physician Assistants and Nurse Practitioners) who all work together to provide you with the care you need, when you need it.  We recommend signing up for the patient portal called "MyChart".  Sign up information is provided on this After Visit Summary.  MyChart is used to connect with patients for Virtual Visits (Telemedicine).  Patients are able to view lab/test results, encounter notes, upcoming appointments, etc.  Non-urgent messages can be sent to your provider as well.   To learn more about what you can do with MyChart, go to [email or web address].    Your next appointment:   2 month(s)  Provider:   You may see Maurice Small, MD or one of the following Advanced Practice Providers on your designated Care Team:   Francis Dowse, [place] Casimiro Needle "Mardelle Matte" Lanna Poche, [place]   Other Instructions

## 2023-04-22 ENCOUNTER — Ambulatory Visit: Payer: Medicare Other | Admitting: Physician Assistant

## 2023-04-24 DIAGNOSIS — I5032 Chronic diastolic (congestive) heart failure: Secondary | ICD-10-CM | POA: Diagnosis not present

## 2023-04-24 DIAGNOSIS — Z87442 Personal history of urinary calculi: Secondary | ICD-10-CM | POA: Diagnosis not present

## 2023-04-24 DIAGNOSIS — N189 Chronic kidney disease, unspecified: Secondary | ICD-10-CM | POA: Diagnosis not present

## 2023-04-24 DIAGNOSIS — I130 Hypertensive heart and chronic kidney disease with heart failure and stage 1 through stage 4 chronic kidney disease, or unspecified chronic kidney disease: Secondary | ICD-10-CM | POA: Diagnosis not present

## 2023-04-24 DIAGNOSIS — N1832 Chronic kidney disease, stage 3b: Secondary | ICD-10-CM | POA: Diagnosis not present

## 2023-04-24 DIAGNOSIS — D631 Anemia in chronic kidney disease: Secondary | ICD-10-CM | POA: Diagnosis not present

## 2023-04-29 DIAGNOSIS — G8929 Other chronic pain: Secondary | ICD-10-CM | POA: Diagnosis not present

## 2023-04-29 DIAGNOSIS — Z96641 Presence of right artificial hip joint: Secondary | ICD-10-CM | POA: Diagnosis not present

## 2023-04-29 DIAGNOSIS — C419 Malignant neoplasm of bone and articular cartilage, unspecified: Secondary | ICD-10-CM | POA: Diagnosis not present

## 2023-04-29 DIAGNOSIS — Z96649 Presence of unspecified artificial hip joint: Secondary | ICD-10-CM | POA: Diagnosis not present

## 2023-04-29 DIAGNOSIS — M978XXA Periprosthetic fracture around other internal prosthetic joint, initial encounter: Secondary | ICD-10-CM | POA: Diagnosis not present

## 2023-04-29 DIAGNOSIS — M25551 Pain in right hip: Secondary | ICD-10-CM | POA: Diagnosis not present

## 2023-04-30 DIAGNOSIS — R8271 Bacteriuria: Secondary | ICD-10-CM | POA: Diagnosis not present

## 2023-04-30 DIAGNOSIS — N3021 Other chronic cystitis with hematuria: Secondary | ICD-10-CM | POA: Diagnosis not present

## 2023-04-30 DIAGNOSIS — N202 Calculus of kidney with calculus of ureter: Secondary | ICD-10-CM | POA: Diagnosis not present

## 2023-05-02 ENCOUNTER — Ambulatory Visit (HOSPITAL_BASED_OUTPATIENT_CLINIC_OR_DEPARTMENT_OTHER): Payer: Medicare Other | Attending: Family Medicine | Admitting: Physical Therapy

## 2023-05-02 DIAGNOSIS — R531 Weakness: Secondary | ICD-10-CM | POA: Insufficient documentation

## 2023-05-02 DIAGNOSIS — R2689 Other abnormalities of gait and mobility: Secondary | ICD-10-CM | POA: Diagnosis not present

## 2023-05-02 DIAGNOSIS — M25551 Pain in right hip: Secondary | ICD-10-CM | POA: Insufficient documentation

## 2023-05-02 DIAGNOSIS — R262 Difficulty in walking, not elsewhere classified: Secondary | ICD-10-CM | POA: Insufficient documentation

## 2023-05-02 DIAGNOSIS — M6281 Muscle weakness (generalized): Secondary | ICD-10-CM | POA: Insufficient documentation

## 2023-05-02 DIAGNOSIS — R278 Other lack of coordination: Secondary | ICD-10-CM | POA: Diagnosis not present

## 2023-05-02 NOTE — Therapy (Signed)
OUTPATIENT PHYSICAL THERAPY LOWER EXTREMITY EVALUATION   Patient Name: Bruce Ball MRN: [id number] DOB:August 29, 1946, 77 y.o., male Today's Date: 05/03/2023  END OF SESSION:  PT End of Session - 05/03/23 0715     Visit Number 1    Number of Visits 16    Date for PT Re-Evaluation 07/11/23    Authorization Type MCR A and B    PT Start Time 0850    PT Stop Time 0940    PT Time Calculation (min) 50 min    Equipment Utilized During Treatment Gait belt    Activity Tolerance Patient tolerated treatment well    Behavior During Therapy WFL for tasks assessed/performed             Past Medical History:  Diagnosis Date   Ankylosing spondylitis (HCC)    chronic back and leg pain   Arthritis of right knee    Basal cell carcinoma    Community acquired pneumonia of right upper lobe of lung 11/05/2017   Coronary artery calcification seen on CAT scan 03/27/2021   Deafness in right ear    Essential tremor    Extrinsic asthma, unspecified 04/07/2009   PT DENIES   Femoral condyle fracture (HCC) 07/15/2019   GERD (gastroesophageal reflux disease)    Gross hematuria 06/14/2019   History of kidney stones    HYPERTENSION 10/29/2007   Neuropathy    Osteopenia    Other hyperlipidemia    Peptic ulcer    Periprosthetic fracture of shaft of femur 10/10/2021   PUD, HX OF 04/21/2008   Pulmonary emboli (HCC) 2021   noted on CT 12-2019   Stage 3 chronic kidney disease (HCC)    Thyroid nodule    Past Surgical History:  Procedure Laterality Date   BACK SURGERY     bone cancer  1970   rt femur removed and steel rod placed   CYSTOSCOPY WITH RETROGRADE PYELOGRAM, URETEROSCOPY AND STENT PLACEMENT Right 11/06/2017   Procedure: CYSTOSCOPY WITH  STENT PLACEMENT RIGHT;  Surgeon: Heloise Purpura, MD;  Location: [facility];  Service: Urology;  Laterality: Right;   CYSTOSCOPY WITH RETROGRADE PYELOGRAM, URETEROSCOPY AND STENT PLACEMENT Left 06/09/2019   Procedure: CYSTOSCOPY WITH RETROGRADE PYELOGRAM,  URETEROSCOPY AND STENT PLACEMENT X2;  Surgeon: Sebastian Ache, MD;  Location: [facility];  Service: Urology;  Laterality: Left;   CYSTOSCOPY WITH RETROGRADE PYELOGRAM, URETEROSCOPY AND STENT PLACEMENT Bilateral 03/01/2020   Procedure: CYSTOSCOPY WITH RETROGRADE PYELOGRAM, URETEROSCOPY AND STENT PLACEMENT;  Surgeon: Sebastian Ache, MD;  Location: [facility];  Service: Urology;  Laterality: Bilateral;  75 MINS   CYSTOSCOPY/RETROGRADE/URETEROSCOPY     1 year ago at [facility] in [place]   CYSTOSCOPY/URETEROSCOPY/HOLMIUM LASER Right 11/24/2017   Procedure: CYSTOSCOPY/URETEROSCOPY/ RETROGRADE/STENT REMOVAL;  Surgeon: Heloise Purpura, MD;  Location: [facility];  Service: Urology;  Laterality: Right;   HOLMIUM LASER APPLICATION Bilateral 03/01/2020   Procedure: HOLMIUM LASER APPLICATION;  Surgeon: Sebastian Ache, MD;  Location: [facility];  Service: Urology;  Laterality: Bilateral;   prosatectomy     Patient Active Problem List   Diagnosis Date Noted   UTI due to Klebsiella species 11/11/2022   Basal cell carcinoma of scalp 09/16/2022   Deposits (accretions) on teeth 09/16/2022   Nontoxic single thyroid nodule 09/16/2022   Thyroid nodule 09/16/2022   Sensorineural hearing loss, bilateral 09/16/2022   Post-COVID syndrome 09/16/2022   Chronic pansinusitis 07/11/2022   Non-seasonal allergic rhinitis 06/11/2022   Abrasion of right arm 11/19/2021   Femur fracture, right (HCC) 10/17/2021   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  HCC) 10/16/2021   History of pulmonary embolism 10/10/2021   Chronic diastolic CHF (congestive heart failure) (HCC) 10/10/2021   CKD (chronic kidney disease), stage III (HCC) 10/10/2021   Hyperlipidemia 10/10/2021   CAD (coronary artery disease) 04/03/2021   Chronic anticoagulation 03/26/2021   Acquired coagulation disorder (HCC) 12/12/2020   Senile purpura (HCC) 12/12/2020   Meibomian gland dysfunction of right eye, unspecified eyelid 07/11/2020   Vitamin B12 deficient megaloblastic anemia 07/11/2020    Aortic atherosclerosis (HCC) 05/17/2020   B12 deficiency 05/17/2020   Iliac artery aneurysm (HCC) 05/17/2020   Osteoporosis 04/04/2020   Idiopathic neuropathy 01/28/2020   Recurrent falls 01/28/2020   GERD (gastroesophageal reflux disease) 12/22/2019   Essential tremor 12/22/2019   Kidney stone 11/05/2017   History of total right hip replacement 03/08/2017   History of prostate cancer 03/08/2017   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   Vertigo 03/01/2017   High risk medication use 02/28/2017   DDD (degenerative disc disease), thoracic 02/28/2017   Ataxia 05/18/2016   Vestibular disequilibrium 05/18/2016   Failed spinal cord stimulator (HCC) 03/20/2016   Lung nodule, solitary 05/24/2014   Peripheral edema 06/08/2012   Backache 04/21/2008   History of peptic ulcer disease 04/21/2008   Essential hypertension 10/29/2007     REFERRING PROVIDER: Shelva Majestic, MD  REFERRING DIAG: R26.89 (ICD-10-CM) - Balance disorder            R53.1 (ICD-10-CM) - Generalized weakness  THERAPY DIAG:  Muscle weakness (generalized)  Other lack of coordination  Pain in right hip  Difficulty in walking, not elsewhere classified  Rationale for Evaluation and Treatment: Rehabilitation  ONSET DATE: PT order 04/03/2023 / Fall with Fx and surgery in Oct 2022  SUBJECTIVE:   SUBJECTIVE STATEMENT: Pt has a Hx of falls.   He reports having 8 falls in the past 6 months.  Pt states having 1 fall in the past month. Pt states he falls due to weakness and balance.  Pt fell in 2022 which resulted in a periprosthetic fx and had a revision in Oct 2022.  Pt was in a rehab facility in [place] after the surgery.  He had posterior hip precautions, but states he doesn't have any precautions now.  Pt had difficulty walking having to use a walker and was weak. Pt was in PT from March 2023 to October 2023 and states he made "tremendous progress".  He had to stop PT due to going out of town.   Pt had a teleheath visit with MD  on 4/4.  MD ordered PT.  Pt reports he has regressed since he was last in PT.  "The main thing is falling".  Pt has a life alert now, because he has fallen so much.  Pt had a follow up with MD from [facility] on Tuesday.  Pt fell when the elevator started up.  He denies any pain.  Pt had x rays on hip which he states looked good. Pt is returning to [facility] in order to do some testing on his R hip.    Pt states he doesn't do stairs.  He has difficulty with balance on stairs.  Pt was having difficulty with car transfers though has improved now.  Pt has minimal difficulty with car transfers.   He typically uses a rollator in his home.  Pt uses cane but uses an electric scooter for longer distance.  He has increased R hip pain with movement/ambulation.   Pt felt better on prednisone after having sinus surgery and wasn't  using an AD with gait.  He is using an AD since stopping prednisone.      PERTINENT HISTORY: -Pt is having surgery for kidney stones in a couple of weeks.  -Deafness in R ear  -R Hip surgery/proximal femur replacement many years ago with THA revision in the setting of a periprosthetic distal femur fx 10/17/2021  -L5-S1 fusion 2021, OA R knee, neuropathy, Dizziness/vertigo, PE 2021, HTN, and CKD   PAIN:  Are you having pain?  Current and best pain: 0/10, worst pain:  10/10 Location:  R hip   PRECAUTIONS: Fall and Other: R THA revision, Lumbar fusion, neuropathy  WEIGHT BEARING RESTRICTIONS: No  FALLS:  Has patient fallen in last 6 months? Yes. Number of falls 8  LIVING ENVIRONMENT: Lives with: lives with their spouse Lives in: 2 story home Stairs: yes, but pt states he doesn't do stairs. Has following equipment at home: rollator, cane, electric scooter  OCCUPATION: retired  PLOF: Independent  PATIENT GOALS: improve balance and strength.  Return to the level he was in Oct with PT.    OBJECTIVE:   DIAGNOSTIC FINDINGS: Pt had x rays on R hip which are unable to view in  Epic.   PATIENT SURVEYS:  FOTO:  Will give next visit  COGNITION: Overall cognitive status: Within functional limits for tasks assessed         LOWER EXTREMITY MMT:  MMT Right eval Left eval  Hip flexion weak 5/5  Hip extension    Hip abduction weak WFL  Hip adduction    Hip internal rotation    Hip external rotation    Knee flexion    Knee extension 4-/5 5/5  Ankle dorsiflexion 4/5 5/5  Ankle plantarflexion Weak, but tolerates resistance WFL, setated  Ankle inversion    Ankle eversion     (Blank rows = not tested)    FUNCTIONAL TESTS:  5x STS:  22 sec without UE support  Standing with NBOS:  30 sec without LOB  Standing with EC: 8 sec with LOB  Tandem stance:  7-13 sec  GAIT: Comments: Pt ambulated with SPC on L.  Pt was stable with gait until he had significant hip pain and his  R LE buckled and required min assist for LOB.  Pt had to stand for a couple of minutes in one place with knee bent due to having hip pain.  Pt has decreased gait speed.    TODAY'S TREATMENT:                                                                                                                                 PATIENT EDUCATION:  Education details: dx, objective findings, prognosis, relevant anatomy, exercise rationale, and POC.  Person educated: Patient Education method: Explanation Education comprehension: verbalized understanding  HOME EXERCISE PROGRAM: Pt has a HEP.   ASSESSMENT:  CLINICAL IMPRESSION: Patient is a 77 y.o. male with a dx's of balance  disorder and muscle weakness presenting to the clinic with balance deficits, muscle weakness, R hip pain, and difficulty in walking.  Pt reports having 8 falls in the past 6 months and 1 fall in the past month.  Pt made great progress in prior PT and reports he has regressed since he was last in PT.  Pt has balance deficits and increased R hip pain with movement/ambulation.  Pt has limitations and difficulty with functional  mobility including ambulation, stairs, and transfers.  He uses an AD with ambulation.  Pt had an episode of pain and R LE buckling today with ambulation.  He didn't fall though did require min assist from PT.  Pt should benefit from skilled PT services consisting of land based and aquatic therapy.    OBJECTIVE IMPAIRMENTS: Abnormal gait, decreased activity tolerance, decreased balance, decreased endurance, decreased mobility, difficulty walking, decreased strength, and pain.   ACTIVITY LIMITATIONS: standing, squatting, stairs, transfers, and locomotion level  PARTICIPATION LIMITATIONS: cleaning, shopping, and community activity  PERSONAL FACTORS: 3+ comorbidities: R THA revision, Lumbar fusion, neuropathy, vertigo, R knee OA  are also affecting patient's functional outcome.   REHAB POTENTIAL: Good  CLINICAL DECISION MAKING: Evolving/moderate complexity  EVALUATION COMPLEXITY: Moderate   GOALS:   SHORT TERM GOALS: Target date: 05/30/2023  Pt will be independent and compliant with HEP for improved pain, strength, ROM and function.   Baseline: Goal status: INITIAL  2.  Pt will tolerate aquatic therapy without adverse effects for improved mobility, balance, and strength.   Baseline:  Goal status: INITIAL  3.  Pt will report at least a 50% improvement in R LE giving way/knee buckling and stability with gait/mobility.  Baseline:  Goal status: INITIAL  4.  Pt will have no difficulty with car transfers.   Baseline:  Goal status: INITIAL    LONG TERM GOALS: Target date: 07/11/2023  Pt will deny having any falls.  Baseline:  Goal status: INITIAL  2.  Pt will perform 5x STS test in no > 13 sec for improved functional LE strength and performance of transfers.   Baseline:  Goal status: INITIAL  3.  Pt will be able to perform tandem stance for 20 seconds for improved balance and proprioception with daily mobility.   Baseline:  Goal status: INITIAL  4.  Pt will ambulate extended  community distance with AD with good stability and without significant pain or difficulty.  Baseline:  Goal status: INITIAL  5.  Pt will demo improved R LE strength  to 5/5 in R knee extension, 4+/5 in R DF, and by improved tolerance with resistance with hip flexion and hip abduction for improved performance of functional mobility.  Baseline:  Goal status: INITIAL     PLAN:  PT FREQUENCY: 2x/week  PT DURATION: other: 10 weeks  PLANNED INTERVENTIONS: Therapeutic exercises, Therapeutic activity, Neuromuscular re-education, Balance training, Gait training, Patient/Family education, Joint mobilization, Stair training, Aquatic Therapy, Electrical stimulation, Cryotherapy, Moist heat, Taping, and Manual therapy   PLAN FOR NEXT SESSION: Give FOTO next visit.  Pt may miss some PT due to having surgery for kidney stones.  Review HEP.  Pt to continue with land and aquatic therapy.  Will probably perform a couple of land visits first and then perform aquatic therapy.    Audie Clear III PT, DPT 05/03/23 8:12 AM

## 2023-05-03 ENCOUNTER — Other Ambulatory Visit: Payer: Self-pay

## 2023-05-03 ENCOUNTER — Encounter (HOSPITAL_BASED_OUTPATIENT_CLINIC_OR_DEPARTMENT_OTHER): Payer: Self-pay | Admitting: Physical Therapy

## 2023-05-06 ENCOUNTER — Ambulatory Visit (HOSPITAL_BASED_OUTPATIENT_CLINIC_OR_DEPARTMENT_OTHER): Payer: Medicare Other | Admitting: Physical Therapy

## 2023-05-06 ENCOUNTER — Encounter: Payer: Medicare Other | Admitting: Pharmacist

## 2023-05-06 DIAGNOSIS — R278 Other lack of coordination: Secondary | ICD-10-CM

## 2023-05-06 DIAGNOSIS — M6281 Muscle weakness (generalized): Secondary | ICD-10-CM | POA: Diagnosis not present

## 2023-05-06 DIAGNOSIS — R262 Difficulty in walking, not elsewhere classified: Secondary | ICD-10-CM

## 2023-05-06 DIAGNOSIS — R531 Weakness: Secondary | ICD-10-CM | POA: Diagnosis not present

## 2023-05-06 DIAGNOSIS — M25551 Pain in right hip: Secondary | ICD-10-CM

## 2023-05-06 DIAGNOSIS — R2689 Other abnormalities of gait and mobility: Secondary | ICD-10-CM | POA: Diagnosis not present

## 2023-05-06 NOTE — Therapy (Signed)
OUTPATIENT PHYSICAL THERAPY LOWER EXTREMITY EVALUATION   Patient Name: Bruce Ball MRN: [id number] DOB:[birth year], 77 y.o., male Today's Date: 05/07/2023  END OF SESSION:  PT End of Session - 05/06/23 1411     Visit Number 2    Number of Visits 16    Date for PT Re-Evaluation 07/11/23    Authorization Type MCR A and B    PT Start Time 1321    PT Stop Time 1408    PT Time Calculation (min) 47 min    Equipment Utilized During Treatment Gait belt    Activity Tolerance Patient tolerated treatment well    Behavior During Therapy WFL for tasks assessed/performed             Past Medical History:  Diagnosis Date   Ankylosing spondylitis (HCC)    chronic back and leg pain   Arthritis of right knee    Basal cell carcinoma    Community acquired pneumonia of right upper lobe of lung 11/05/2017   Coronary artery calcification seen on CAT scan 03/27/2021   Deafness in right ear    Essential tremor    Extrinsic asthma, unspecified 04/07/2009   PT DENIES   Femoral condyle fracture (HCC) 07/15/2019   GERD (gastroesophageal reflux disease)    Gross hematuria 06/14/2019   History of kidney stones    HYPERTENSION 10/29/2007   Neuropathy    Osteopenia    Other hyperlipidemia    Peptic ulcer    Periprosthetic fracture of shaft of femur 10/10/2021   PUD, HX OF 04/21/2008   Pulmonary emboli (HCC) 2021   noted on CT 12-2019   Stage 3 chronic kidney disease (HCC)    Thyroid nodule    Past Surgical History:  Procedure Laterality Date   BACK SURGERY     bone cancer  1970   rt femur removed and steel rod placed   CYSTOSCOPY WITH RETROGRADE PYELOGRAM, URETEROSCOPY AND STENT PLACEMENT Right 11/06/2017   Procedure: CYSTOSCOPY WITH  STENT PLACEMENT RIGHT;  Surgeon: Heloise Purpura, MD;  Location: [facility];  Service: Urology;  Laterality: Right;   CYSTOSCOPY WITH RETROGRADE PYELOGRAM, URETEROSCOPY AND STENT PLACEMENT Left 06/09/2019   Procedure: CYSTOSCOPY WITH RETROGRADE PYELOGRAM,  URETEROSCOPY AND STENT PLACEMENT X2;  Surgeon: Sebastian Ache, MD;  Location: [facility];  Service: Urology;  Laterality: Left;   CYSTOSCOPY WITH RETROGRADE PYELOGRAM, URETEROSCOPY AND STENT PLACEMENT Bilateral 03/01/2020   Procedure: CYSTOSCOPY WITH RETROGRADE PYELOGRAM, URETEROSCOPY AND STENT PLACEMENT;  Surgeon: Sebastian Ache, MD;  Location: [facility];  Service: Urology;  Laterality: Bilateral;  75 MINS   CYSTOSCOPY/RETROGRADE/URETEROSCOPY     1 year ago at [place] in [place]   CYSTOSCOPY/URETEROSCOPY/HOLMIUM LASER Right 11/24/2017   Procedure: CYSTOSCOPY/URETEROSCOPY/ RETROGRADE/STENT REMOVAL;  Surgeon: Heloise Purpura, MD;  Location: [facility];  Service: Urology;  Laterality: Right;   HOLMIUM LASER APPLICATION Bilateral 03/01/2020   Procedure: HOLMIUM LASER APPLICATION;  Surgeon: Sebastian Ache, MD;  Location: [facility];  Service: Urology;  Laterality: Bilateral;   prosatectomy     Patient Active Problem List   Diagnosis Date Noted   UTI due to Klebsiella species 11/11/2022   Basal cell carcinoma of scalp 09/16/2022   Deposits (accretions) on teeth 09/16/2022   Nontoxic single thyroid nodule 09/16/2022   Thyroid nodule 09/16/2022   Sensorineural hearing loss, bilateral 09/16/2022   Post-COVID syndrome 09/16/2022   Chronic pansinusitis 07/11/2022   Non-seasonal allergic rhinitis 06/11/2022   Abrasion of right arm 11/19/2021   Femur fracture, right (HCC) 10/17/2021   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  HCC) 10/16/2021   History of pulmonary embolism 10/10/2021   Chronic diastolic CHF (congestive heart failure) (HCC) 10/10/2021   CKD (chronic kidney disease), stage III (HCC) 10/10/2021   Hyperlipidemia 10/10/2021   CAD (coronary artery disease) 04/03/2021   Chronic anticoagulation 03/26/2021   Acquired coagulation disorder (HCC) 12/12/2020   Senile purpura (HCC) 12/12/2020   Meibomian gland dysfunction of right eye, unspecified eyelid 07/11/2020   Vitamin B12 deficient megaloblastic anemia 07/11/2020    Aortic atherosclerosis (HCC) 05/17/2020   B12 deficiency 05/17/2020   Iliac artery aneurysm (HCC) 05/17/2020   Osteoporosis 04/04/2020   Idiopathic neuropathy 01/28/2020   Recurrent falls 01/28/2020   GERD (gastroesophageal reflux disease) 12/22/2019   Essential tremor 12/22/2019   Kidney stone 11/05/2017   History of total right hip replacement 03/08/2017   History of prostate cancer 03/08/2017   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   Vertigo 03/01/2017   High risk medication use 02/28/2017   DDD (degenerative disc disease), thoracic 02/28/2017   Ataxia 05/18/2016   Vestibular disequilibrium 05/18/2016   Failed spinal cord stimulator (HCC) 03/20/2016   Lung nodule, solitary 05/24/2014   Peripheral edema 06/08/2012   Backache 04/21/2008   History of peptic ulcer disease 04/21/2008   Essential hypertension 10/29/2007     REFERRING PROVIDER: Shelva Majestic, MD  REFERRING DIAG: R26.89 (ICD-10-CM) - Balance disorder            R53.1 (ICD-10-CM) - Generalized weakness  THERAPY DIAG:  Muscle weakness (generalized)  Other lack of coordination  Pain in right hip  Difficulty in walking, not elsewhere classified  Rationale for Evaluation and Treatment: Rehabilitation  ONSET DATE: PT order 04/03/2023 / Fall with Fx and surgery in Oct 2022  SUBJECTIVE:   SUBJECTIVE STATEMENT: Pt is having increased pain today.  He has pain with initially getting up which improves as moves around.  Pt denies any adverse effects after prior Rx.  Pt went to water walking at the pool at the [facility] for 1 hour yesterday.  He reports having no increased pain after walking in the pool.  Pt hasn't been performing most of his HEP since his hip has started bothering him.  He is performing LAQ and sit to stands.  Pt reports he made much progress in his prior bout of PT.    PERTINENT HISTORY: -Pt is having surgery for kidney stones in a couple of weeks.  -Deafness in R ear  -R Hip surgery/proximal femur  replacement many years ago with THA revision in the setting of a periprosthetic distal femur fx 10/17/2021  -L5-S1 fusion 2021, OA R knee, neuropathy, Dizziness/vertigo, PE 2021, HTN, and CKD   PAIN:  Are you having pain?  Current and best pain: 8/10, worst pain:  10/10 Location:  R hip   PRECAUTIONS: Fall and Other: R THA revision, Lumbar fusion, neuropathy  WEIGHT BEARING RESTRICTIONS: No  FALLS:  Has patient fallen in last 6 months? Yes. Number of falls 8  LIVING ENVIRONMENT: Lives with: lives with their spouse Lives in: 2 story home Stairs: yes, but pt states he doesn't do stairs. Has following equipment at home: rollator, cane, electric scooter  OCCUPATION: retired  PLOF: Independent  PATIENT GOALS: improve balance and strength.  Return to the level he was in Oct with PT.    OBJECTIVE:   DIAGNOSTIC FINDINGS: Pt had x rays on R hip which are unable to view in Epic.     TODAY'S TREATMENT:  Therapeutic Exercise:  Attempted Nustep though unable due to pain  Supine bridge 2x10  Sit to stands from elevated table 2x5 without UE support.  Pt has increased Wb'ing on L LE    Reviewed HEP   FOTO:   42 with a goal of 47 at visit #12.   Manual Therapy:  Pt very tender with palpation of R quad.  Some tenderness in distal medial R HS. No tenderness in R ant hip and lateral hip.  PT used roller on R quad in supine and to R HS in L S/L'ing with pillow b/w knees.   Neuro Re-ed activities:  Standing on airex with CGA x 30 sec  Standing on airex with NBOS with CGA 2x30 sec  Marching on airex with UE support with CGA x 10 reps  Step ups on airex x 10 reps with UE support with CGA  PATIENT EDUCATION:  Education details: dx, objective findings, prognosis, relevant anatomy, exercise rationale, and POC.  Person educated: Patient Education method:  Explanation Education comprehension: verbalized understanding  HOME EXERCISE PROGRAM: Pt has a HEP.   ASSESSMENT:  CLINICAL IMPRESSION: Pt presents to Rx with increased pain today.  Pt was unable to use the Nustep due to pain.  PT palpated hip, quad, and HS and he was very tender in R quad.  PT rolled quad and HS to improve tightness, pain, and mobility.  He reports improved tenderness in quad after rolling and felt better after rolling.  Pt had improved tolerance with mobility after rolling.  Pt has increased Wb'ing thru L LE with performing sit to stands.  Pt performed balance exercises with CGA and gait belt.  He required UE support for dynamic balance exercises.  He responded well to Rx stating he feels better and reporting improved pain from 8/10 to 3/10 after Rx.  Pt should benefit from skilled PT services consisting of land based and aquatic therapy to improve impairments, address goals, and improve overall function.  OBJECTIVE IMPAIRMENTS: Abnormal gait, decreased activity tolerance, decreased balance, decreased endurance, decreased mobility, difficulty walking, decreased strength, and pain.   ACTIVITY LIMITATIONS: standing, squatting, stairs, transfers, and locomotion level  PARTICIPATION LIMITATIONS: cleaning, shopping, and community activity  PERSONAL FACTORS: 3+ comorbidities: R THA revision, Lumbar fusion, neuropathy, vertigo, R knee OA  are also affecting patient's functional outcome.   REHAB POTENTIAL: Good  CLINICAL DECISION MAKING: Evolving/moderate complexity  EVALUATION COMPLEXITY: Moderate   GOALS:   SHORT TERM GOALS: Target date: 05/30/2023  Pt will be independent and compliant with HEP for improved pain, strength, ROM and function.   Baseline: Goal status: INITIAL  2.  Pt will tolerate aquatic therapy without adverse effects for improved mobility, balance, and strength.   Baseline:  Goal status: INITIAL  3.  Pt will report at least a 50% improvement in R LE  giving way/knee buckling and stability with gait/mobility.  Baseline:  Goal status: INITIAL  4.  Pt will have no difficulty with car transfers.   Baseline:  Goal status: INITIAL    LONG TERM GOALS: Target date: 07/11/2023  Pt will deny having any falls.  Baseline:  Goal status: INITIAL  2.  Pt will perform 5x STS test in no > 13 sec for improved functional LE strength and performance of transfers.   Baseline:  Goal status: INITIAL  3.  Pt will be able to perform tandem stance for 20 seconds for improved balance and proprioception with daily mobility.   Baseline:  Goal status: INITIAL  4.  Pt will ambulate extended community distance with AD with good stability and without significant pain or difficulty.  Baseline:  Goal status: INITIAL  5.  Pt will demo improved R LE strength  to 5/5 in R knee extension, 4+/5 in R DF, and by improved tolerance with resistance with hip flexion and hip abduction for improved performance of functional mobility.  Baseline:  Goal status: INITIAL     PLAN:  PT FREQUENCY: 2x/week  PT DURATION: other: 10 weeks  PLANNED INTERVENTIONS: Therapeutic exercises, Therapeutic activity, Neuromuscular re-education, Balance training, Gait training, Patient/Family education, Joint mobilization, Stair training, Aquatic Therapy, Electrical stimulation, Cryotherapy, Moist heat, Taping, and Manual therapy   PLAN FOR NEXT SESSION:  Pt may miss some PT due to having surgery for kidney stones.  Pt to continue with land and aquatic therapy.  Will probably perform a couple of land visits first and then perform aquatic therapy.    Audie Clear III PT, DPT 05/07/23 8:45 PM

## 2023-05-07 ENCOUNTER — Encounter (HOSPITAL_BASED_OUTPATIENT_CLINIC_OR_DEPARTMENT_OTHER): Payer: Self-pay | Admitting: Physical Therapy

## 2023-05-08 DIAGNOSIS — M6289 Other specified disorders of muscle: Secondary | ICD-10-CM | POA: Diagnosis not present

## 2023-05-08 DIAGNOSIS — M25551 Pain in right hip: Secondary | ICD-10-CM | POA: Diagnosis not present

## 2023-05-08 DIAGNOSIS — R600 Localized edema: Secondary | ICD-10-CM | POA: Diagnosis not present

## 2023-05-08 DIAGNOSIS — Z96641 Presence of right artificial hip joint: Secondary | ICD-10-CM | POA: Diagnosis not present

## 2023-05-08 DIAGNOSIS — Z4789 Encounter for other orthopedic aftercare: Secondary | ICD-10-CM | POA: Diagnosis not present

## 2023-05-08 DIAGNOSIS — G8929 Other chronic pain: Secondary | ICD-10-CM | POA: Diagnosis not present

## 2023-05-08 DIAGNOSIS — L7682 Other postprocedural complications of skin and subcutaneous tissue: Secondary | ICD-10-CM | POA: Diagnosis not present

## 2023-05-09 NOTE — Therapy (Signed)
OUTPATIENT PHYSICAL THERAPY LOWER EXTREMITY EVALUATION   Patient Name: Bruce Ball MRN: [id number] DOB:May 16, 1946, 77 y.o., male Today's Date: 05/10/2023  END OF SESSION:  PT End of Session - 05/10/23 1136     Visit Number 3    Number of Visits 16    Date for PT Re-Evaluation 07/11/23    Authorization Type MCR A and B    PT Start Time 1045    PT Stop Time 1125    PT Time Calculation (min) 40 min    Equipment Utilized During Treatment Gait belt    Activity Tolerance Patient tolerated treatment well    Behavior During Therapy WFL for tasks assessed/performed              Past Medical History:  Diagnosis Date   Ankylosing spondylitis (HCC)    chronic back and leg pain   Arthritis of right knee    Basal cell carcinoma    Community acquired pneumonia of right upper lobe of lung 11/05/2017   Coronary artery calcification seen on CAT scan 03/27/2021   Deafness in right ear    Essential tremor    Extrinsic asthma, unspecified 04/07/2009   PT DENIES   Femoral condyle fracture (HCC) 07/15/2019   GERD (gastroesophageal reflux disease)    Gross hematuria 06/14/2019   History of kidney stones    HYPERTENSION 10/29/2007   Neuropathy    Osteopenia    Other hyperlipidemia    Peptic ulcer    Periprosthetic fracture of shaft of femur 10/10/2021   PUD, HX OF 04/21/2008   Pulmonary emboli (HCC) 2021   noted on CT 12-2019   Stage 3 chronic kidney disease (HCC)    Thyroid nodule    Past Surgical History:  Procedure Laterality Date   BACK SURGERY     bone cancer  1970   rt femur removed and steel rod placed   CYSTOSCOPY WITH RETROGRADE PYELOGRAM, URETEROSCOPY AND STENT PLACEMENT Right 11/06/2017   Procedure: CYSTOSCOPY WITH  STENT PLACEMENT RIGHT;  Surgeon: Heloise Purpura, MD;  Location: [facility];  Service: Urology;  Laterality: Right;   CYSTOSCOPY WITH RETROGRADE PYELOGRAM, URETEROSCOPY AND STENT PLACEMENT Left 06/09/2019   Procedure: CYSTOSCOPY WITH RETROGRADE PYELOGRAM,  URETEROSCOPY AND STENT PLACEMENT X2;  Surgeon: Sebastian Ache, MD;  Location: [facility];  Service: Urology;  Laterality: Left;   CYSTOSCOPY WITH RETROGRADE PYELOGRAM, URETEROSCOPY AND STENT PLACEMENT Bilateral 03/01/2020   Procedure: CYSTOSCOPY WITH RETROGRADE PYELOGRAM, URETEROSCOPY AND STENT PLACEMENT;  Surgeon: Sebastian Ache, MD;  Location: [facility];  Service: Urology;  Laterality: Bilateral;  75 MINS   CYSTOSCOPY/RETROGRADE/URETEROSCOPY     1 year ago at [place] [facility] [place]   CYSTOSCOPY/URETEROSCOPY/HOLMIUM LASER Right 11/24/2017   Procedure: CYSTOSCOPY/URETEROSCOPY/ RETROGRADE/STENT REMOVAL;  Surgeon: Heloise Purpura, MD;  Location: [facility];  Service: Urology;  Laterality: Right;   HOLMIUM LASER APPLICATION Bilateral 03/01/2020   Procedure: HOLMIUM LASER APPLICATION;  Surgeon: Sebastian Ache, MD;  Location: [facility];  Service: Urology;  Laterality: Bilateral;   prosatectomy     Patient Active Problem List   Diagnosis Date Noted   UTI due to Klebsiella species 11/11/2022   Basal cell carcinoma of scalp 09/16/2022   Deposits (accretions) on teeth 09/16/2022   Nontoxic single thyroid nodule 09/16/2022   Thyroid nodule 09/16/2022   Sensorineural hearing loss, bilateral 09/16/2022   Post-COVID syndrome 09/16/2022   Chronic pansinusitis 07/11/2022   Non-seasonal allergic rhinitis 06/11/2022   Abrasion of right arm 11/19/2021   Femur fracture, right (HCC) 10/17/2021  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HCC) 10/16/2021   History of pulmonary embolism 10/10/2021   Chronic diastolic CHF (congestive heart failure) (HCC) 10/10/2021   CKD (chronic kidney disease), stage III (HCC) 10/10/2021   Hyperlipidemia 10/10/2021   CAD (coronary artery disease) 04/03/2021   Chronic anticoagulation 03/26/2021   Acquired coagulation disorder (HCC) 12/12/2020   Senile purpura (HCC) 12/12/2020   Meibomian gland dysfunction of right eye, unspecified eyelid 07/11/2020   Vitamin B12 deficient megaloblastic anemia 07/11/2020    Aortic atherosclerosis (HCC) 05/17/2020   B12 deficiency 05/17/2020   Iliac artery aneurysm (HCC) 05/17/2020   Osteoporosis 04/04/2020   Idiopathic neuropathy 01/28/2020   Recurrent falls 01/28/2020   GERD (gastroesophageal reflux disease) 12/22/2019   Essential tremor 12/22/2019   Kidney stone 11/05/2017   History of total right hip replacement 03/08/2017   History of prostate cancer 03/08/2017   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   Vertigo 03/01/2017   High risk medication use 02/28/2017   DDD (degenerative disc disease), thoracic 02/28/2017   Ataxia 05/18/2016   Vestibular disequilibrium 05/18/2016   Failed spinal cord stimulator (HCC) 03/20/2016   Lung nodule, solitary 05/24/2014   Peripheral edema 06/08/2012   Backache 04/21/2008   History of peptic ulcer disease 04/21/2008   Essential hypertension 10/29/2007     REFERRING PROVIDER: Shelva Majestic, MD  REFERRING DIAG: R26.89 (ICD-10-CM) - Balance disorder            R53.1 (ICD-10-CM) - Generalized weakness  THERAPY DIAG:  Muscle weakness (generalized)  Pain in right hip  Difficulty in walking, not elsewhere classified  Rationale for Evaluation and Treatment: Rehabilitation  ONSET DATE: PT order 04/03/2023 / Fall with Fx and surgery in Oct 2022  SUBJECTIVE:   SUBJECTIVE STATEMENT: Pt reporting overall pain much better from last session. 4/10 today. Reporting a lot of walking at [facility] over the time with leg lifts in chair. Imaging of posterior THA revision from Friday was reviewed.   Pt is having increased pain today.  He has pain with initially getting up which improves as moves around.  Pt denies any adverse effects after prior Rx.  Pt went to water walking at the pool at the [facility] for 1 hour yesterday.  He reports having no increased pain after walking in the pool.  Pt hasn't been performing most of his HEP since his hip has started bothering him.  He is performing LAQ and sit to stands.  Pt reports he made  much progress in his prior bout of PT.    PERTINENT HISTORY: -Pt is having surgery for kidney stones in a couple of weeks.  -Deafness in R ear  -R Hip surgery/proximal femur replacement many years ago with THA revision in the setting of a periprosthetic distal femur fx 10/17/2021  -L5-S1 fusion 2021, OA R knee, neuropathy, Dizziness/vertigo, PE 2021, HTN, and CKD   PAIN:  Are you having pain?  Current and best pain: 4/10, worst pain:  10/10 Location:  R hip   PRECAUTIONS: Fall and Other: R THA revision, Lumbar fusion, neuropathy  WEIGHT BEARING RESTRICTIONS: No  FALLS:  Has patient fallen in last 6 months? Yes. Number of falls 8  LIVING ENVIRONMENT: Lives with: lives with their spouse Lives in: 2 story home Stairs: yes, but pt states he doesn't do stairs. Has following equipment at home: rollator, cane, electric scooter  OCCUPATION: retired  PLOF: Independent  PATIENT GOALS: improve balance and strength.  Return to the level he was in Oct with PT.    OBJECTIVE:  DIAGNOSTIC FINDINGS: Pt had x rays on R hip which are unable to view in Epic.     TODAY'S TREATMENT:                                                                                                                              05/10/2023  -Nustep x 5' arms @ 12 -LAQ 3 x 15 w/ 2lb ankle weight -Supine bridges with GTB around knees 2 x 12 with 3 second hold w/ 30 second break -Sit/stands with GTB around knees 3 x 8 with 5lb KB -Airex feet together 2 x 30 seconds CGA -Airex feet together 2 x 30 seconds with UE horizontal abd/adduction @ CGA - Side steps with YTB on knees 5x length of mat.    Therapeutic Exercise:  Attempted Nustep though unable due to pain  Supine bridge 2x10  Sit to stands from elevated table 2x5 without UE support.  Pt has increased Wb'ing on L LE    Reviewed HEP   FOTO:   42 with a goal of 47 at visit #12.   Manual Therapy:  Pt very tender with palpation of R quad.  Some  tenderness in distal medial R HS. No tenderness in R ant hip and lateral hip.  PT used roller on R quad in supine and to R HS in L S/L'ing with pillow b/w knees.   Neuro Re-ed activities:  Standing on airex with CGA x 30 sec  Standing on airex with NBOS with CGA 2x30 sec  Marching on airex with UE support with CGA x 10 reps  Step ups on airex x 10 reps with UE support with CGA  PATIENT EDUCATION:  Education details: dx, objective findings, prognosis, relevant anatomy, exercise rationale, and POC.  Person educated: Patient Education method: Explanation Education comprehension: verbalized understanding  HOME EXERCISE PROGRAM: Pt has a HEP.   ASSESSMENT:  CLINICAL IMPRESSION: Pt tolerating session well. Focused on heavy RLE strengthening since pain levels overall much better from previous session.  Focusing as well on balance. Continues at [facility] with balance and negotiation off airex due to pain and RLE weakness. Shows lateral trunk flexion compensation on R side when descending airex steps.  Pt should benefit from skilled PT services consisting of land based and aquatic therapy to improve impairments, address goals, and improve overall function.  OBJECTIVE IMPAIRMENTS: Abnormal gait, decreased activity tolerance, decreased balance, decreased endurance, decreased mobility, difficulty walking, decreased strength, and pain.   ACTIVITY LIMITATIONS: standing, squatting, stairs, transfers, and locomotion level  PARTICIPATION LIMITATIONS: cleaning, shopping, and community activity  PERSONAL FACTORS: 3+ comorbidities: R THA revision, Lumbar fusion, neuropathy, vertigo, R knee OA  are also affecting patient's functional outcome.   REHAB POTENTIAL: Good  CLINICAL DECISION MAKING: Evolving/moderate complexity  EVALUATION COMPLEXITY: Moderate   GOALS:   SHORT TERM GOALS: Target date: 05/30/2023  Pt will be independent and compliant with HEP for improved pain, strength, ROM and function.  Baseline: Goal status: INITIAL  2.  Pt will tolerate aquatic therapy without adverse effects for improved mobility, balance, and strength.   Baseline:  Goal status: INITIAL  3.  Pt will report at least a 50% improvement in R LE giving way/knee buckling and stability with gait/mobility.  Baseline:  Goal status: INITIAL  4.  Pt will have no difficulty with car transfers.   Baseline:  Goal status: INITIAL    LONG TERM GOALS: Target date: 07/11/2023  Pt will deny having any falls.  Baseline:  Goal status: INITIAL  2.  Pt will perform 5x STS test in no > 13 sec for improved functional LE strength and performance of transfers.   Baseline:  Goal status: INITIAL  3.  Pt will be able to perform tandem stance for 20 seconds for improved balance and proprioception with daily mobility.   Baseline:  Goal status: INITIAL  4.  Pt will ambulate extended community distance with AD with good stability and without significant pain or difficulty.  Baseline:  Goal status: INITIAL  5.  Pt will demo improved R LE strength  to 5/5 in R knee extension, 4+/5 in R DF, and by improved tolerance with resistance with hip flexion and hip abduction for improved performance of functional mobility.  Baseline:  Goal status: INITIAL     PLAN:  PT FREQUENCY: 2x/week  PT DURATION: other: 10 weeks  PLANNED INTERVENTIONS: Therapeutic exercises, Therapeutic activity, Neuromuscular re-education, Balance training, Gait training, Patient/Family education, Joint mobilization, Stair training, Aquatic Therapy, Electrical stimulation, Cryotherapy, Moist heat, Taping, and Manual therapy   PLAN FOR NEXT SESSION:  Pt may miss some PT due to having surgery for kidney stones.  Pt to continue with land and aquatic therapy.  Will probably perform a couple of land visits first and then perform aquatic therapy.    Nelida Meuse PT, DPT Physical Therapist with [facility] [phone] office

## 2023-05-10 ENCOUNTER — Encounter (HOSPITAL_BASED_OUTPATIENT_CLINIC_OR_DEPARTMENT_OTHER): Payer: Self-pay

## 2023-05-10 ENCOUNTER — Ambulatory Visit (HOSPITAL_BASED_OUTPATIENT_CLINIC_OR_DEPARTMENT_OTHER): Payer: Medicare Other

## 2023-05-10 DIAGNOSIS — R262 Difficulty in walking, not elsewhere classified: Secondary | ICD-10-CM

## 2023-05-10 DIAGNOSIS — R278 Other lack of coordination: Secondary | ICD-10-CM | POA: Diagnosis not present

## 2023-05-10 DIAGNOSIS — M6281 Muscle weakness (generalized): Secondary | ICD-10-CM

## 2023-05-10 DIAGNOSIS — R531 Weakness: Secondary | ICD-10-CM | POA: Diagnosis not present

## 2023-05-10 DIAGNOSIS — M25551 Pain in right hip: Secondary | ICD-10-CM

## 2023-05-10 DIAGNOSIS — R2689 Other abnormalities of gait and mobility: Secondary | ICD-10-CM | POA: Diagnosis not present

## 2023-05-12 ENCOUNTER — Encounter (HOSPITAL_BASED_OUTPATIENT_CLINIC_OR_DEPARTMENT_OTHER): Payer: Self-pay | Admitting: Urology

## 2023-05-12 NOTE — Progress Notes (Signed)
Spoke w/ via phone for pre-op interview--- pt Lab needs dos---- [employer] results------ current EKG in epic/ chart COVID test -----patient states asymptomatic no test needed Arrive at -------  0800 on 05-16-2023 NPO after MN NO Solid Food.  Clear liquids from MN until--- 0700 Med rec completed Medications to take morning of surgery ----- coreg, protoinix , advised pt take norvasc as directed if systolic bp is >150 am dos , am dos.  Pt verbalized understanding per Dr Berneice Heinrich to continue eliquis but not take am dose prior to surgery Diabetic medication ----- n/a Patient instructed no nail polish to be worn day of surgery Patient instructed to bring photo id and insurance card day of surgery Patient aware to have Driver (ride ) / caregiver    for 24 hours after surgery -- wife, karen Patient Special Instructions ----- n/a Pre-Op special Instructions ----- in special needs in posting,  per Dr Berneice Heinrich pt to remain on all anticoagulations. Patient verbalized understanding of instructions that were given at this phone interview. Patient denies shortness of breath, chest pain, fever, cough at this phone interview.   Anesthesia Review:  HTN;  PACs;  chronic diastolic CHF w/ preserved ef (last exacerbation 03/ 2022);  hx PE all lobes & DVT-LLE, treated w/ chronic eliquis;  gait disorder w/ recurrent falls (last fall 2 wks ago);  CKD 3;  hx osteochondosarcoma right femor 1970 no recurrence Pt denies chest pain, sob, but does have peripheral swelling (has not taken prn lasix in several months)  PCP: Dr Kathie Rhodes. [place] [facility] [place] Cardiologist :  Dr Karie Schwalbe. Turner Theron Arista 04-21-2023) Nephrologist:  Dr Carmelina Noun Oncologist:  Dr Lacretia Nicks. Ranae Palms [facility] lov 04-29-2023) Chest x-ray : 09-16-2022 EKG : 04-21-2023 Monitor:  04-08-2023 Echo : 09-20-2022  Stress test: 10-01-2022 Cardiac Cath : no Activity level:  denies sob w/ normal activities Sleep Study/ CPAP : no Blood Thinner/ Instructions Maurice Small Dose:  eliquis ASA / Instructions/ Last Dose : no

## 2023-05-13 ENCOUNTER — Ambulatory Visit: Payer: Medicare Other | Admitting: Cardiology

## 2023-05-13 NOTE — Progress Notes (Deleted)
Cardiology Office Note    Date:  05/13/2023   ID:  Bruce Ball, DOB [birth year], MRN [id number]   PCP:  Shelva Majestic, MD   [facility]  Cardiologist:  Armanda Magic, MD  Advanced Practice Provider:  No care team member to display Electrophysiologist:  Maurice Small, MD   [phone]   No chief complaint on file.    History of Present Illness:  Bruce Ball is a 77 y.o. male with history of hypertension, PE on chronic anticoagulation with Eliquis.  Patient was seen by [name] in the hospital 03/28/2021 after developing lower extremity edema after 2400 mile road trip at the [place] and eating fast foods.  CT showed no PE but did have coronary artery calcification, echo hyperdynamic LVEF greater than 75% with moderate LVH and grade 1 DD trivial MR mild dilatation of the ascending aorta.  He was dx with acute diastolic CHF.  He was treated with Lasix 40 mg twice daily and diuresed 3.2 L and 16 pounds but creatinine bumped up.  Blood pressure was elevated and he was continued on carvedilol 25 mg twice daily.    He had a coronary Ca score done which showed a coronary Ca score of 152 in all 3 epicardial vessels which was 43rd% for age and sex matched controls. He also had extensive aortic arch and descending aortic atherosclerosis and AV leaflet calcifications.   He was seen by EP back in March 24 due to palpitations and was found to have non-perfusing PVCs resulting in low heart rates and dizziness.  He was changed from carvedilol to atenolol 50 mg daily and started on magnesium.  He wore a 1 week Zio patch which revealed an average heart rate of 58 bpm with multiple episodes of SVT with longest run lasting 13.5 seconds.  He had rare PVCs less than 1%.  He has not seen EP back since then.  He is here today for followup and is doing well.  He denies any chest pain or pressure, SOB, DOE, PND, orthopnea, LE edema, dizziness, palpitations or syncope. He is compliant  with his meds and is tolerating meds with no SE.      Past Medical History:  Diagnosis Date   Anemia associated with chronic renal failure    At high risk for falls    due to gait disorders;   recurrent falls   (05-12-2023  per pt last fall 2 weeks ago)   Chronic anticoagulation    eliquis--- managed by pcp   Coronary artery calcification seen on CAT scan    followed by dr t. Harmon Bommarito---   05-31-2021  calcium score= 152   Deafness in right ear    ED (erectile dysfunction)    Edema of both lower extremities    Essential tremor    Gait disorder    imbalance when walking uses cane/ walker;  recurrent falls   GERD (gastroesophageal reflux disease)    Hematuria    Hiatal hernia    History of adenomatous polyp of colon    History of agent Orange exposure    History of basal cell carcinoma (BCC) excision    History of DVT of lower extremity 12/2019   admission in epic ;   w/ PE's all lobes   History of esophageal dilatation 12/2019   dr Adela Lank   History of kidney stones    History of peptic ulcer 2009   History of prostate cancer 12/2001   followed  by dr Berneice Heinrich;   first dx 01/ 2003,  Gleason 3+3,   03-10-2002  s/p  radical prostatectomy pelvic lymph node disections , no recurrence   History of pulmonary embolus (PE) 12/2019   admission in epic;   submassive all lobes and dvt -lle   History of sarcoma of bone 1970   orthopedic oncologist--- dr w. 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 Devon Pretty;  monitor in epic 04-08-2023 low PVC burden and atrial ectopy   Renal calculi    bilateral   Stage 3 chronic kidney disease [facility])    nephrologist--- dr Ronalee Belts   Thyroid nodule 2016   endocrinologist--- dr Dewitt Hoes  [facility])  hx benign bx, right side   Urinary incontinence    total incontinence   Wears hearing aid in left ear     Past Surgical History:  Procedure Laterality Date   CYSTOSCOPY WITH RETROGRADE PYELOGRAM, URETEROSCOPY AND STENT PLACEMENT Right 11/06/2017   Procedure: CYSTOSCOPY WITH  STENT PLACEMENT RIGHT;  Surgeon: Heloise Purpura, MD;  Location: [facility];  Service: Urology;  Laterality: Right;   CYSTOSCOPY WITH RETROGRADE PYELOGRAM, URETEROSCOPY AND STENT PLACEMENT Left 06/09/2019   Procedure: CYSTOSCOPY WITH RETROGRADE PYELOGRAM, URETEROSCOPY AND STENT PLACEMENT X2;  Surgeon: Sebastian Ache, MD;  Location: [facility];  Service: Urology;  Laterality: Left;   CYSTOSCOPY WITH RETROGRADE PYELOGRAM, URETEROSCOPY AND STENT PLACEMENT Bilateral 03/01/2020   Procedure: CYSTOSCOPY WITH RETROGRADE PYELOGRAM, URETEROSCOPY AND STENT PLACEMENT;  Surgeon: Sebastian Ache, MD;  Location: [facility];  Service: Urology;  Laterality: Bilateral;  75 MINS   CYSTOSCOPY/RETROGRADE/URETEROSCOPY  2020   at [facility] in [place]   CYSTOSCOPY/URETEROSCOPY/HOLMIUM LASER Right 11/24/2017   Procedure: CYSTOSCOPY/URETEROSCOPY/ RETROGRADE/STENT REMOVAL;  Surgeon: Heloise Purpura, MD;  Location: [facility];  Service: Urology;  Laterality: Right;   FEMORAL OSTEOTOMY W/ RODDING  1970   right proximal femUr resection replacement w/ custom prosthesis   FEMUR FRACTURE SURGERY Right 10/17/2021   @ [facility] by dr w. eward;  REVISION RIGHT PROXIMAL FEMUR REPLACEMENT IN SETTING OF DISTAL PERIPROSTHETIC FEMUR FRACTURE, BOTH COMPONENTS   FUNCTIONAL ENDOSCOPIC SINUS SURGERY  03/19/2023   @ [facility]   HOLMIUM LASER APPLICATION Bilateral 03/01/2020   Procedure: HOLMIUM LASER APPLICATION;  Surgeon: Sebastian Ache, MD;  Location: [facility];  Service: Urology;  Laterality: Bilateral;   LUMBAR SPINE SURGERY  07/2017   @ [facility]  by dr Newell Coral;    L5-- S1  laminectomy   POSTERIOR LUMBAR FUSION  06/28/2020   @ [facility] [place];   L5--S1   PROSTATECTOMY  03/10/2002   @[facility]  by dr Aldean Ast;    w/ pelvic lymph node dissection's   SPINAL CORD STIMULATOR REMOVAL  04/15/2016   W/   THORACIC PARTIAL LAMINECTOMY T9    Current Medications: No outpatient medications have been marked as taking for the 05/13/23 encounter (Appointment) with Quintella Reichert, MD.     Allergies:   Tape, Erythromycin,  and Lisinopril   Social History   Socioeconomic History   Marital status: Married    Spouse name: Not on file   Number of children: 2   Years of education: Not on file   Highest education level: Not on file  Occupational History   Occupation: Retired Airline pilot  Tobacco Use   Smoking status: Former    Packs/day: 1.00    Years: 20.00    Additional pack years: 0.00    Total pack years: 20.00    Types: Cigarettes    Start date: 1963    Quit date: 1980    Years since quitting: 44.3   Smokeless tobacco: Never  Vaping Use   Vaping Use: Never used  Substance and Sexual Activity   Alcohol use: No   Drug use: Never   Sexual activity: Not on file  Other Topics Concern   Not on file  Social History Narrative   ** Merged History Encounter **       Social Determinants of Health   Financial Resource Strain: Low Risk  (02/04/2023)   Overall Financial Resource Strain (CARDIA)    Difficulty of Paying Living Expenses: Not hard at all  Food Insecurity: No Food Insecurity (02/04/2023)   Hunger Vital Sign    Worried About Running Out of Food in the Last Year: Never true    Ran Out of Food in the Last Year: Never true  Transportation Needs: No Transportation Needs (02/04/2023)   PRAPARE - Administrator, Civil Service (Medical): No    Lack of Transportation (Non-Medical):  No  Physical Activity: Insufficiently Active (01/25/2022)   Exercise Vital Sign    Days of Exercise per Week: 2 days    Minutes of Exercise per Session: 50 min  Stress: No Stress Concern Present (01/31/2023)   [employer] of Occupational Health - Occupational Stress Questionnaire    Feeling of Stress : Not at all  Social Connections: Unknown (01/31/2023)   Social Connection and Isolation Panel [NHANES]    Frequency of Communication with Friends and Family: Not on file    Frequency of Social Gatherings with Friends and Family: Not on file    Attends Religious Services: Never    Database administrator or Organizations: Not on file    Attends Banker Meetings: Never    Marital Status: Not on file     Family History:  The patient's family history includes COPD in his father; Depression in his brother; Hypertension in his father; Prostate cancer in his brother; Stomach cancer in his brother; Stroke in his brother.   ROS:   Please see the history of present illness.    ROS All other systems reviewed and are negative.   PHYSICAL EXAM:   VS:  There were no vitals taken for this visit.  Physical Exam  GEN: Well nourished, well developed in no acute distress HEENT: Normal NECK: No JVD; No carotid bruits LYMPHATICS: No lymphadenopathy CARDIAC:RRR, no murmurs, rubs, gallops RESPIRATORY:  Clear to auscultation without rales, wheezing or rhonchi  ABDOMEN: Soft, non-tender, non-distended MUSCULOSKELETAL:  No edema; No deformity  SKIN: Warm and dry NEUROLOGIC:  Alert and oriented x 3 PSYCHIATRIC:  Normal affect  Wt Readings from Last 3 Encounters:  04/21/23 241 lb (109.3 kg)  04/03/23 230 lb (104.3 kg)  03/17/23 240 lb (108.9 kg)      Studies/Labs Reviewed:   EKG:  EKG is not ordered today.   Recent Labs: 09/16/2022: Pro B Natriuretic peptide (  BNP) 81.0 11/27/2022: ALT 22; Hemoglobin 15.0; Platelets 227.0 03/17/2023: BUN 18; Creatinine, Ser 1.57; Magnesium 2.1;  Potassium 4.6; Sodium 145; TSH 1.330   Lipid Panel    Component Value Date/Time   CHOL 101 07/11/2021 0000   CHOL 103 06/05/2021 0935   TRIG 84 07/11/2021 0000   HDL 34 (A) 07/11/2021 0000   HDL 38 (L) 06/05/2021 0935   CHOLHDL 3 04/03/2021 1637   VLDL 19.2 04/03/2021 1637   LDLCALC 50 07/11/2021 0000   LDLCALC 52 06/05/2021 0935   LDLDIRECT 145.4 06/27/2011 1124    Additional studies/ records that were reviewed today include:  ECHO 03/27/21 IMPRESSIONS     1. Intracavitary gradient. Peak velocity 2.07 m/s. Peak gradient 17 mmHg.  Left ventricular ejection fraction, by estimation, is >75%. Left  ventricular ejection fraction by 2D MOD biplane is 76.9 %. Left  ventricular ejection fraction by PLAX is 76 %. The   left ventricle has hyperdynamic function. The left ventricle has no  regional wall motion abnormalities. There is moderate concentric left  ventricular hypertrophy. Left ventricular diastolic parameters are  consistent with Grade I diastolic dysfunction  (impaired relaxation).   2. Right ventricular systolic function is normal. The right ventricular  size is normal. There is normal pulmonary artery systolic pressure.   3. The mitral valve is normal in structure. Trivial mitral valve  regurgitation. No evidence of mitral stenosis.   4. The aortic valve is calcified. There is mild calcification of the  aortic valve. There is mild thickening of the aortic valve. Aortic valve  regurgitation is not visualized. Mild aortic valve sclerosis is present,  with no evidence of aortic valve  stenosis.   5. Aortic dilatation noted. There is mild dilatation of the ascending  aorta, measuring 39 mm.   6. The inferior vena cava is normal in size with greater than 50%  respiratory variability, suggesting right atrial pressure of 3 mmHg.   FINDINGS   Left Ventricle: Intracavitary gradient. Peak velocity 2.07 m/s. Peak  gradient 17 mmHg. Left ventricular ejection fraction, by  estimation, is  >75%. Left ventricular ejection fraction by PLAX is 76 %. Left ventricular  ejection fraction by 2D MOD biplane  is 76.9 %. The left ventricle has hyperdynamic function. The left  ventricle has no regional wall motion abnormalities. The left ventricular  internal cavity size was normal in size. There is moderate concentric left  ventricular hypertrophy. Left  ventricular diastolic parameters are consistent with Grade I diastolic  dysfunction (impaired relaxation). Normal left ventricular filling  pressure.   Right Ventricle: The right ventricular size is normal. No increase in  right ventricular wall thickness. Right ventricular systolic function is  normal. There is normal pulmonary artery systolic pressure. The tricuspid  regurgitant velocity is 2.10 m/s, and   with an assumed right atrial pressure of 3 mmHg, the estimated right  ventricular systolic pressure is 20.6 mmHg.   Left Atrium: Left atrial size was normal in size.   Right Atrium: Right atrial size was normal in size.   Pericardium: Trivial pericardial effusion is present.   Mitral Valve: The mitral valve is normal in structure. Trivial mitral  valve regurgitation. No evidence of mitral valve stenosis.   Tricuspid Valve: The tricuspid valve is normal in structure. Tricuspid  valve regurgitation is trivial. No evidence of tricuspid stenosis.   Aortic Valve: The aortic valve is calcified. There is mild calcification  of the aortic valve. There is mild thickening of the aortic valve. Aortic  valve regurgitation is not visualized. Mild aortic valve sclerosis is  present, with no evidence of aortic  valve stenosis.   Pulmonic Valve: The pulmonic valve was normal in structure. Pulmonic valve  regurgitation is not visualized. No evidence of pulmonic stenosis.   Aorta: Aortic dilatation noted. There is mild dilatation of the ascending  aorta, measuring 39 mm.   Venous: The inferior vena cava is normal in  size with greater than 50%  respiratory variability, suggesting right atrial pressure of 3 mmHg.   IAS/Shunts: No atrial level shunt detected by color flow Doppler.      Echo 01/14/20 IMPRESSIONS     1. Left ventricular ejection fraction, by visual estimation, is 60 to  65%. The left ventricle has normal function. There is moderately increased  left ventricular hypertrophy.   2. Moderate asymmetric hypertrophy of the basal septum measuring 15mm  (posterior wall 11mm)   3. The mitral valve is normal in structure. No evidence of mitral valve  regurgitation.   4. The aortic valve is tricuspid. Aortic valve regurgitation is trivial.  Mild aortic valve sclerosis without stenosis.   5. The tricuspid valve is grossly normal.   6. The pulmonic valve was not well visualized. Pulmonic valve  regurgitation is trivial.   7. Global right ventricle has normal systolic function.The right  ventricular size is normal.   8. Left atrial size was normal.   9. Right atrial size was normal.  10. TR signal is inadequate for assessing pulmonary artery systolic  pressure.     ASSESSMENT:    1. Chronic heart failure with preserved ejection fraction (HFpEF) (HCC)   2. Primary hypertension   3. Hyperlipidemia LDL goal <70   4. Coronary artery calcification   5. PVC's (premature ventricular contractions)      PLAN:  In order of problems listed above:  Chronic diastolic CHF -Echo 02/2021 showed hyperdynamic LVEF greater than 75% with moderate LVH and grade 1 DD.   -He appears on exam today and denies any shortness of breath or lower extremity edema -he is only using Lasix PRN>>he has not taken any recently -continue Low Na diet  Hypertension  -BP is adequately controlled -Continue prescription drug management with amlodipine 2.5 mg twice daily, carvedilol 25 mg twice daily with as needed refills  Coronary calcifications on CT -coronary Ca score elevated at 152 -He has not had any anginal  symptoms since I saw him last -Continue drug management with atorvastatin 20 mg daily with as needed refills -No ASA as he is on anticoagulation with Eliquis for his history of PE  History of pulmonary embolus  -on chronic anticoagulation with Eliquis  HLD -LDL goal < 70 -Check FLP and ALT -Continue prescription drug managed with atorvastatin 20 mg daily with as needed refills  PAT/PVCs -He was seen by EP and felt that likely his PVCs were causing his problems with dizziness as he was having nonperfused PVCs on exam -1 week Zio patch did showed a PVC load less than 1% but did show multiple episodes of SVT -His carvedilol was supposed to been changed to atenolol 50 mg daily  Shared Decision Making/Informed Consent        Medication Adjustments/Labs and Tests Ordered: Current medicines are reviewed at length with the patient today.  Concerns regarding medicines are outlined above.  Medication changes, Labs and Tests ordered today are listed in the Patient Instructions below. There are no Patient Instructions on file for this visit.    Signed, Gloris Manchester  Mayford Knife, MD  05/13/2023 12:22 PM    [facility] [address] [place], [place], [place]  [address] Phone: [phone]; Fax: [phone]

## 2023-05-14 ENCOUNTER — Ambulatory Visit (HOSPITAL_BASED_OUTPATIENT_CLINIC_OR_DEPARTMENT_OTHER): Payer: Medicare Other | Admitting: Physical Therapy

## 2023-05-14 DIAGNOSIS — R2689 Other abnormalities of gait and mobility: Secondary | ICD-10-CM | POA: Diagnosis not present

## 2023-05-14 DIAGNOSIS — M6281 Muscle weakness (generalized): Secondary | ICD-10-CM

## 2023-05-14 DIAGNOSIS — M25551 Pain in right hip: Secondary | ICD-10-CM | POA: Diagnosis not present

## 2023-05-14 DIAGNOSIS — R262 Difficulty in walking, not elsewhere classified: Secondary | ICD-10-CM | POA: Diagnosis not present

## 2023-05-14 DIAGNOSIS — R531 Weakness: Secondary | ICD-10-CM | POA: Diagnosis not present

## 2023-05-14 DIAGNOSIS — R278 Other lack of coordination: Secondary | ICD-10-CM | POA: Diagnosis not present

## 2023-05-14 NOTE — Therapy (Signed)
OUTPATIENT PHYSICAL THERAPY LOWER EXTREMITY EVALUATION   Patient Name: Bruce Ball MRN: [id number] DOB:05-Mar-1946, 77 y.o., male Today's Date: 05/16/2023  END OF SESSION:  PT End of Session - 05/14/23 1632     Visit Number 4    Number of Visits 16    Date for PT Re-Evaluation 07/11/23    Authorization Type MCR A and B    PT Start Time 1625    PT Stop Time 1709    PT Time Calculation (min) 44 min    Equipment Utilized During Treatment Gait belt    Activity Tolerance Patient tolerated treatment well    Behavior During Therapy WFL for tasks assessed/performed              Past Medical History:  Diagnosis Date   Anemia associated with chronic renal failure    At high risk for falls    due to gait disorders;   recurrent falls   (05-12-2023  per pt last fall 2 weeks ago)   Chronic anticoagulation    eliquis--- managed by pcp   Coronary artery calcification seen on CAT scan    followed by dr t. turner---   05-31-2021  calcium score= 152   Deafness in right ear    ED (erectile dysfunction)    Edema of both lower extremities    Essential tremor    Gait disorder    imbalance when walking uses cane/ walker;  recurrent falls   GERD (gastroesophageal reflux disease)    Hematuria    Hiatal hernia    History of adenomatous polyp of colon    History of agent Orange exposure    History of basal cell carcinoma (BCC) excision    History of DVT of lower extremity 12/2019   admission in epic ;   w/ PE's all lobes   History of esophageal dilatation 12/2019   dr Adela Lank   History of kidney stones    History of peptic ulcer 2009   History of prostate cancer 12/2001   followed by dr Berneice Heinrich;   first dx 01/ 2003,  Gleason 3+3,   03-10-2002  s/p  radical prostatectomy pelvic lymph node disections , no recurrence   History of pulmonary embolus (PE) 12/2019   admission in epic;   submassive all lobes and dvt -lle   History of sarcoma of bone 1970   orthopedic oncologist--- dr w.  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 turner;  monitor in epic 04-08-2023 low PVC burden and atrial ectopy   Renal calculi    bilateral   Stage 3 chronic kidney disease [facility])    nephrologist--- dr Ronalee Belts   Thyroid nodule 2016   endocrinologist--- dr Dewitt Hoes  [facility])  hx benign bx, right side   Urinary incontinence    total incontinence   Wears hearing aid in left ear    Past Surgical History:  Procedure Laterality Date   CYSTOSCOPY WITH RETROGRADE  PYELOGRAM, URETEROSCOPY AND STENT PLACEMENT Right 11/06/2017   Procedure: CYSTOSCOPY WITH  STENT PLACEMENT RIGHT;  Surgeon: Heloise Purpura, MD;  Location: [facility];  Service: Urology;  Laterality: Right;   CYSTOSCOPY WITH RETROGRADE PYELOGRAM, URETEROSCOPY AND STENT PLACEMENT Left 06/09/2019   Procedure: CYSTOSCOPY WITH RETROGRADE PYELOGRAM, URETEROSCOPY AND STENT PLACEMENT X2;  Surgeon: Sebastian Ache, MD;  Location: [facility];  Service: Urology;  Laterality: Left;   CYSTOSCOPY WITH RETROGRADE PYELOGRAM, URETEROSCOPY AND STENT PLACEMENT Bilateral 03/01/2020   Procedure: CYSTOSCOPY WITH RETROGRADE PYELOGRAM, URETEROSCOPY AND STENT PLACEMENT;  Surgeon: Sebastian Ache, MD;  Location: [facility];  Service: Urology;  Laterality: Bilateral;  75 MINS   CYSTOSCOPY/RETROGRADE/URETEROSCOPY  2020   at [facility] in [place]   CYSTOSCOPY/URETEROSCOPY/HOLMIUM LASER Right 11/24/2017   Procedure: CYSTOSCOPY/URETEROSCOPY/ RETROGRADE/STENT  REMOVAL;  Surgeon: Heloise Purpura, MD;  Location: [facility];  Service: Urology;  Laterality: Right;   FEMORAL OSTEOTOMY W/ RODDING  1970   right proximal femUr resection replacement w/ custom prosthesis   FEMUR FRACTURE SURGERY Right 10/17/2021   @ [facility] by dr w. eward;  REVISION RIGHT PROXIMAL FEMUR REPLACEMENT IN SETTING OF DISTAL PERIPROSTHETIC FEMUR FRACTURE, BOTH COMPONENTS   FUNCTIONAL ENDOSCOPIC SINUS SURGERY  03/19/2023   @ [facility]   HOLMIUM LASER APPLICATION Bilateral 03/01/2020   Procedure: HOLMIUM LASER APPLICATION;  Surgeon: Sebastian Ache, MD;  Location: [facility];  Service: Urology;  Laterality: Bilateral;   LUMBAR SPINE SURGERY  07/2017   @ [facility]  by dr Newell Coral;    L5-- S1  laminectomy   POSTERIOR LUMBAR FUSION  06/28/2020   @ [facility] [place];   L5--S1   PROSTATECTOMY  03/10/2002   @[facility]  by dr Aldean Ast;    w/ pelvic lymph node dissection's   SPINAL CORD STIMULATOR REMOVAL  04/15/2016   W/   THORACIC PARTIAL LAMINECTOMY T9   Patient Active Problem List   Diagnosis Date Noted   UTI due to Klebsiella species 11/11/2022   Basal cell carcinoma of scalp 09/16/2022   Deposits (accretions) on teeth 09/16/2022   Nontoxic single thyroid nodule 09/16/2022   Thyroid nodule 09/16/2022   Sensorineural hearing loss, bilateral 09/16/2022   Post-COVID syndrome 09/16/2022   Chronic pansinusitis 07/11/2022   Non-seasonal allergic rhinitis 06/11/2022   Abrasion of right arm 11/19/2021   Femur fracture, right (HCC) 10/17/2021   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HCC) 10/16/2021   History of pulmonary embolism 10/10/2021   Chronic diastolic CHF (congestive heart failure) (HCC) 10/10/2021   CKD (chronic kidney disease), stage III (HCC) 10/10/2021   Hyperlipidemia 10/10/2021   CAD (coronary artery disease) 04/03/2021   Chronic anticoagulation 03/26/2021   Acquired coagulation disorder (HCC) 12/12/2020   Senile purpura (HCC) 12/12/2020   Meibomian gland dysfunction of right  eye, unspecified eyelid 07/11/2020   Vitamin B12 deficient megaloblastic anemia 07/11/2020   Aortic atherosclerosis (HCC) 05/17/2020   B12 deficiency 05/17/2020   Iliac artery aneurysm (HCC) 05/17/2020   Osteoporosis 04/04/2020   Idiopathic neuropathy 01/28/2020   Recurrent falls 01/28/2020   GERD (gastroesophageal reflux disease) 12/22/2019   Essential tremor 12/22/2019   Kidney stone 11/05/2017   History of total right hip replacement 03/08/2017   History of prostate cancer 03/08/2017   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   Vertigo 03/01/2017   High risk medication use 02/28/2017   DDD (degenerative disc disease), thoracic 02/28/2017   Ataxia 05/18/2016   Vestibular disequilibrium 05/18/2016   Failed spinal cord stimulator (HCC) 03/20/2016   Lung nodule, solitary 05/24/2014   Peripheral edema 06/08/2012   Backache  04/21/2008   History of peptic ulcer disease 04/21/2008   Essential hypertension 10/29/2007     REFERRING PROVIDER: Shelva Majestic, MD  REFERRING DIAG: R26.89 (ICD-10-CM) - Balance disorder            R53.1 (ICD-10-CM) - Generalized weakness  THERAPY DIAG:  Muscle weakness (generalized)  Other lack of coordination  Pain in right hip  Difficulty in walking, not elsewhere classified  Rationale for Evaluation and Treatment: Rehabilitation  ONSET DATE: PT order 04/03/2023 / Fall with Fx and surgery in Oct 2022  SUBJECTIVE:   SUBJECTIVE STATEMENT: Pt states he felt much better and had much less pain when he left after the 2nd PT Rx.  Pt denies any increased pain after prior PT Rx.  Pt states he was in the pool for approx 1 hour and 10 mins this AM and did a lot of sidestepping.  Pt states he is having surgery this Friday for kidney stones.    PERTINENT HISTORY: -Pt is having surgery for kidney stones this Friday.  -Deafness in R ear  -R Hip surgery/proximal femur replacement many years ago with THA revision in the setting of a periprosthetic distal femur  fx 10/17/2021  -L5-S1 fusion 2021, OA R knee, neuropathy, Dizziness/vertigo, PE 2021, HTN, and CKD   PAIN:  Are you having pain?  NPRS:  6-7/10 Location:  R lateral hip   PRECAUTIONS: Fall and Other: R THA revision, Lumbar fusion, neuropathy  WEIGHT BEARING RESTRICTIONS: No  FALLS:  Has patient fallen in last 6 months? Yes. Number of falls 8  LIVING ENVIRONMENT: Lives with: lives with their spouse Lives in: 2 story home Stairs: yes, but pt states he doesn't do stairs. Has following equipment at home: rollator, cane, electric scooter  OCCUPATION: retired  PLOF: Independent  PATIENT GOALS: improve balance and strength.  Return to the level he was in Oct with PT.    OBJECTIVE:   DIAGNOSTIC FINDINGS: Pt had x rays on R hip which are unable to view in Epic.     TODAY'S TREATMENT:                                                                                                                               -Nustep x 6 mins UE/Les at L4 -Sit to stands from hi/lo table with GTB around thighs 2x8 -LAQ with 2# 3x10 R LE -Supine bridges with GTB around knees 3x10     Neuro Re-ed activities:  Standing on airex with NBOS with CGA 2x30 sec  Standing on airex with UE horiz abd/add with CGA  Marching on airex with UE support with CGA 2x 10 reps  Step ups on airex x 10 reps bilat with UE support with CGA  Manual Therapy:  Pt very tender with palpation of R lateral quad.   PT used roller on R quad in supine       PATIENT EDUCATION:  Education details:  PT instructed  pt to get clearance from MD before returning to PT.  dx, objective findings, prognosis, relevant anatomy, exercise rationale, and POC.  Person educated: Patient Education method: Explanation Education comprehension: verbalized understanding  HOME EXERCISE PROGRAM: Pt has a HEP.   ASSESSMENT:  CLINICAL IMPRESSION: PT worked on LE strengthening, Location manager, and STW.  Pt requires cuing to decrease R  knee IR with sit to stands and PT used a theraband for further cuing to promote improved alignment.  Pt has increased Wb'ing on L LE with sit to stands.  Pt gives good effort with all exercises.  He continues to have soft tissue tightness and tenderness in quad especially in lateral quad.  He responds well to rolling the quad and states he feels much better after Rx with pain improving from 6-7/10 to 3/10 pain.  Pt will be on hold due to a kidney stone procedure and will return to PT when he has clearance.  OBJECTIVE IMPAIRMENTS: Abnormal gait, decreased activity tolerance, decreased balance, decreased endurance, decreased mobility, difficulty walking, decreased strength, and pain.   ACTIVITY LIMITATIONS: standing, squatting, stairs, transfers, and locomotion level  PARTICIPATION LIMITATIONS: cleaning, shopping, and community activity  PERSONAL FACTORS: 3+ comorbidities: R THA revision, Lumbar fusion, neuropathy, vertigo, R knee OA  are also affecting patient's functional outcome.   REHAB POTENTIAL: Good  CLINICAL DECISION MAKING: Evolving/moderate complexity  EVALUATION COMPLEXITY: Moderate   GOALS:   SHORT TERM GOALS: Target date: 05/30/2023  Pt will be independent and compliant with HEP for improved pain, strength, ROM and function.   Baseline: Goal status: INITIAL  2.  Pt will tolerate aquatic therapy without adverse effects for improved mobility, balance, and strength.   Baseline:  Goal status: INITIAL  3.  Pt will report at least a 50% improvement in R LE giving way/knee buckling and stability with gait/mobility.  Baseline:  Goal status: INITIAL  4.  Pt will have no difficulty with car transfers.   Baseline:  Goal status: INITIAL    LONG TERM GOALS: Target date: 07/11/2023  Pt will deny having any falls.  Baseline:  Goal status: INITIAL  2.  Pt will perform 5x STS test in no > 13 sec for improved functional LE strength and performance of transfers.   Baseline:   Goal status: INITIAL  3.  Pt will be able to perform tandem stance for 20 seconds for improved balance and proprioception with daily mobility.   Baseline:  Goal status: INITIAL  4.  Pt will ambulate extended community distance with AD with good stability and without significant pain or difficulty.  Baseline:  Goal status: INITIAL  5.  Pt will demo improved R LE strength  to 5/5 in R knee extension, 4+/5 in R DF, and by improved tolerance with resistance with hip flexion and hip abduction for improved performance of functional mobility.  Baseline:  Goal status: INITIAL     PLAN:  PT FREQUENCY: 2x/week  PT DURATION: other: 10 weeks  PLANNED INTERVENTIONS: Therapeutic exercises, Therapeutic activity, Neuromuscular re-education, Balance training, Gait training, Patient/Family education, Joint mobilization, Stair training, Aquatic Therapy, Electrical stimulation, Cryotherapy, Moist heat, Taping, and Manual therapy   PLAN FOR NEXT SESSION:  Pt may miss some PT due to having surgery for kidney stones this Friday.  Pt will get clearance from MD before returning to PT.  Cont with strengthening, balance training, gait training, and manual techniques.   Audie Clear III PT, DPT 05/16/23 12:26 AM

## 2023-05-16 ENCOUNTER — Encounter (HOSPITAL_BASED_OUTPATIENT_CLINIC_OR_DEPARTMENT_OTHER): Payer: Self-pay | Admitting: Urology

## 2023-05-16 ENCOUNTER — Ambulatory Visit (HOSPITAL_BASED_OUTPATIENT_CLINIC_OR_DEPARTMENT_OTHER): Payer: Medicare Other | Admitting: Anesthesiology

## 2023-05-16 ENCOUNTER — Encounter (HOSPITAL_BASED_OUTPATIENT_CLINIC_OR_DEPARTMENT_OTHER): Payer: Self-pay | Admitting: Physical Therapy

## 2023-05-16 ENCOUNTER — Other Ambulatory Visit: Payer: Self-pay

## 2023-05-16 ENCOUNTER — Encounter (HOSPITAL_BASED_OUTPATIENT_CLINIC_OR_DEPARTMENT_OTHER)
Admission: RE | Disposition: A | Discharge status: Home / Self Care | Payer: Self-pay | Source: Home / Self Care | Attending: Urology

## 2023-05-16 ENCOUNTER — Ambulatory Visit (HOSPITAL_BASED_OUTPATIENT_CLINIC_OR_DEPARTMENT_OTHER)
Admission: RE | Admit: 2023-05-16 | Discharge: 2023-05-16 | Disposition: A | Discharge status: Home / Self Care | Payer: Medicare Other | Attending: Urology | Admitting: Urology

## 2023-05-16 DIAGNOSIS — E669 Obesity, unspecified: Secondary | ICD-10-CM | POA: Diagnosis not present

## 2023-05-16 DIAGNOSIS — I110 Hypertensive heart disease with heart failure: Secondary | ICD-10-CM | POA: Diagnosis not present

## 2023-05-16 DIAGNOSIS — Z87891 Personal history of nicotine dependence: Secondary | ICD-10-CM | POA: Diagnosis not present

## 2023-05-16 DIAGNOSIS — I509 Heart failure, unspecified: Secondary | ICD-10-CM

## 2023-05-16 DIAGNOSIS — Z7901 Long term (current) use of anticoagulants: Secondary | ICD-10-CM | POA: Diagnosis not present

## 2023-05-16 DIAGNOSIS — N183 Chronic kidney disease, stage 3 unspecified: Secondary | ICD-10-CM | POA: Insufficient documentation

## 2023-05-16 DIAGNOSIS — Z86718 Personal history of other venous thrombosis and embolism: Secondary | ICD-10-CM | POA: Diagnosis not present

## 2023-05-16 DIAGNOSIS — N200 Calculus of kidney: Secondary | ICD-10-CM | POA: Insufficient documentation

## 2023-05-16 DIAGNOSIS — Z87440 Personal history of urinary (tract) infections: Secondary | ICD-10-CM | POA: Insufficient documentation

## 2023-05-16 DIAGNOSIS — Q630 Accessory kidney: Secondary | ICD-10-CM | POA: Diagnosis not present

## 2023-05-16 DIAGNOSIS — Z01818 Encounter for other preprocedural examination: Secondary | ICD-10-CM

## 2023-05-16 DIAGNOSIS — I130 Hypertensive heart and chronic kidney disease with heart failure and stage 1 through stage 4 chronic kidney disease, or unspecified chronic kidney disease: Secondary | ICD-10-CM | POA: Insufficient documentation

## 2023-05-16 DIAGNOSIS — I739 Peripheral vascular disease, unspecified: Secondary | ICD-10-CM | POA: Insufficient documentation

## 2023-05-16 DIAGNOSIS — R310 Gross hematuria: Secondary | ICD-10-CM | POA: Diagnosis not present

## 2023-05-16 DIAGNOSIS — Z96651 Presence of right artificial knee joint: Secondary | ICD-10-CM | POA: Insufficient documentation

## 2023-05-16 DIAGNOSIS — Z9079 Acquired absence of other genital organ(s): Secondary | ICD-10-CM | POA: Diagnosis not present

## 2023-05-16 DIAGNOSIS — Z8546 Personal history of malignant neoplasm of prostate: Secondary | ICD-10-CM | POA: Insufficient documentation

## 2023-05-16 DIAGNOSIS — R32 Unspecified urinary incontinence: Secondary | ICD-10-CM | POA: Diagnosis not present

## 2023-05-16 DIAGNOSIS — Z6833 Body mass index (BMI) 33.0-33.9, adult: Secondary | ICD-10-CM | POA: Insufficient documentation

## 2023-05-16 HISTORY — DX: Hematuria, unspecified: R31.9

## 2023-05-16 HISTORY — DX: Personal history of colonic polyps: Z86.010

## 2023-05-16 HISTORY — DX: Essential (primary) hypertension: I10

## 2023-05-16 HISTORY — DX: Localized edema: R60.0

## 2023-05-16 HISTORY — PX: CYSTOSCOPY WITH RETROGRADE PYELOGRAM, URETEROSCOPY AND STENT PLACEMENT: SHX5789

## 2023-05-16 HISTORY — DX: Chronic kidney disease, unspecified: N18.9

## 2023-05-16 HISTORY — DX: Presence of external hearing-aid: Z97.4

## 2023-05-16 HISTORY — DX: Unspecified abnormalities of gait and mobility: R26.9

## 2023-05-16 HISTORY — DX: Personal history of adenomatous and serrated colon polyps: Z86.0101

## 2023-05-16 HISTORY — DX: Hyperlipidemia, unspecified: E78.5

## 2023-05-16 HISTORY — DX: Hereditary and idiopathic neuropathy, unspecified: G60.9

## 2023-05-16 HISTORY — DX: Long term (current) use of anticoagulants: Z79.01

## 2023-05-16 HISTORY — DX: Diaphragmatic hernia without obstruction or gangrene: K44.9

## 2023-05-16 HISTORY — DX: Age-related osteoporosis without current pathological fracture: M81.0

## 2023-05-16 HISTORY — DX: Anemia in chronic kidney disease: D63.1

## 2023-05-16 HISTORY — DX: Male erectile dysfunction, unspecified: N52.9

## 2023-05-16 HISTORY — DX: Atrial premature depolarization: I49.1

## 2023-05-16 HISTORY — DX: Personal history of other malignant neoplasm of skin: Z98.890

## 2023-05-16 HISTORY — PX: HOLMIUM LASER APPLICATION: SHX5852

## 2023-05-16 HISTORY — DX: Unspecified urinary incontinence: R32

## 2023-05-16 HISTORY — DX: Calculus of kidney: N20.0

## 2023-05-16 HISTORY — DX: Contact with and (suspected) exposure to other hazardous, chiefly nonmedicinal, chemicals: Z77.098

## 2023-05-16 HISTORY — DX: History of falling: Z91.81

## 2023-05-16 HISTORY — DX: Unspecified osteoarthritis, unspecified site: M19.90

## 2023-05-16 HISTORY — DX: Personal history of other malignant neoplasm of skin: Z85.828

## 2023-05-16 LAB — POCT I-STAT, CHEM 8
BUN: 27 mg/dL — ABNORMAL HIGH (ref 8–23)
Calcium, Ion: 1.24 mmol/L (ref 1.15–1.40)
Chloride: 105 mmol/L (ref 98–111)
Creatinine, Ser: 1.4 mg/dL — ABNORMAL HIGH (ref 0.61–1.24)
Glucose, Bld: 87 mg/dL (ref 70–99)
HCT: 44 % (ref 39.0–52.0)
Hemoglobin: 15 g/dL (ref 13.0–17.0)
Potassium: 4.1 mmol/L (ref 3.5–5.1)
Sodium: 142 mmol/L (ref 135–145)
TCO2: 29 mmol/L (ref 22–32)

## 2023-05-16 SURGERY — CYSTOURETEROSCOPY, WITH RETROGRADE PYELOGRAM AND STENT INSERTION
Anesthesia: General | Site: Ureter | Laterality: Bilateral

## 2023-05-16 MED ORDER — PHENYLEPHRINE 80 MCG/ML (10ML) SYRINGE FOR IV PUSH (FOR BLOOD PRESSURE SUPPORT)
PREFILLED_SYRINGE | INTRAVENOUS | Status: AC
  Filled 2023-05-16: qty 10

## 2023-05-16 MED ORDER — ONDANSETRON HCL 4 MG/2ML IJ SOLN
INTRAMUSCULAR | Status: AC
  Filled 2023-05-16: qty 2

## 2023-05-16 MED ORDER — OXYCODONE HCL 5 MG PO TABS: 5.0000 mg | ORAL_TABLET | Freq: Once | ORAL | Status: DC | PRN

## 2023-05-16 MED ORDER — PROPOFOL 10 MG/ML IV BOLUS
INTRAVENOUS | Status: DC | PRN
  Administered 2023-05-16: 200 mg via INTRAVENOUS

## 2023-05-16 MED ORDER — SODIUM CHLORIDE 0.9 % IV SOLN: INTRAVENOUS | Status: DC

## 2023-05-16 MED ORDER — ONDANSETRON HCL 4 MG/2ML IJ SOLN
INTRAMUSCULAR | Status: DC | PRN
  Administered 2023-05-16: 4 mg via INTRAVENOUS

## 2023-05-16 MED ORDER — FENTANYL CITRATE (PF) 100 MCG/2ML IJ SOLN
INTRAMUSCULAR | Status: DC | PRN
  Administered 2023-05-16 (×2): 50 ug via INTRAVENOUS

## 2023-05-16 MED ORDER — ONDANSETRON HCL 4 MG/2ML IJ SOLN: 4.0000 mg | Freq: Four times a day (QID) | INTRAMUSCULAR | Status: DC | PRN

## 2023-05-16 MED ORDER — DEXAMETHASONE SODIUM PHOSPHATE 10 MG/ML IJ SOLN
INTRAMUSCULAR | Status: AC
  Filled 2023-05-16: qty 1

## 2023-05-16 MED ORDER — GENTAMICIN SULFATE 40 MG/ML IJ SOLN
5.0000 mg/kg | INTRAVENOUS | Status: AC
  Administered 2023-05-16: 440 mg via INTRAVENOUS
  Filled 2023-05-16: qty 11

## 2023-05-16 MED ORDER — OXYCODONE HCL 5 MG/5ML PO SOLN: 5.0000 mg | Freq: Once | ORAL | Status: DC | PRN

## 2023-05-16 MED ORDER — OXYCODONE-ACETAMINOPHEN 5-325 MG PO TABS: 1.0000 | ORAL_TABLET | Freq: Four times a day (QID) | ORAL | 0 refills | Status: DC | PRN

## 2023-05-16 MED ORDER — SENNOSIDES-DOCUSATE SODIUM 8.6-50 MG PO TABS: 1.0000 | ORAL_TABLET | Freq: Two times a day (BID) | ORAL | 0 refills | Status: DC

## 2023-05-16 MED ORDER — SODIUM CHLORIDE 0.9 % IR SOLN
Status: DC | PRN
  Administered 2023-05-16 (×2): 3000 mL

## 2023-05-16 MED ORDER — FENTANYL CITRATE (PF) 100 MCG/2ML IJ SOLN
25.0000 ug | INTRAMUSCULAR | Status: DC | PRN
  Administered 2023-05-16 (×3): 25 ug via INTRAVENOUS

## 2023-05-16 MED ORDER — FENTANYL CITRATE (PF) 100 MCG/2ML IJ SOLN
INTRAMUSCULAR | Status: AC
  Filled 2023-05-16: qty 2

## 2023-05-16 MED ORDER — IOHEXOL 300 MG/ML  SOLN
INTRAMUSCULAR | Status: DC | PRN
  Administered 2023-05-16: 25 mL via URETHRAL

## 2023-05-16 MED ORDER — LIDOCAINE HCL (PF) 2 % IJ SOLN
INTRAMUSCULAR | Status: AC
  Filled 2023-05-16: qty 10

## 2023-05-16 MED ORDER — 0.9 % SODIUM CHLORIDE (POUR BTL) OPTIME
TOPICAL | Status: DC | PRN
  Administered 2023-05-16: 500 mL

## 2023-05-16 MED ORDER — DEXAMETHASONE SODIUM PHOSPHATE 10 MG/ML IJ SOLN
INTRAMUSCULAR | Status: DC | PRN
  Administered 2023-05-16: 5 mg via INTRAVENOUS

## 2023-05-16 MED ORDER — PROPOFOL 10 MG/ML IV BOLUS
INTRAVENOUS | Status: AC
  Filled 2023-05-16: qty 20

## 2023-05-16 MED ORDER — PHENYLEPHRINE 80 MCG/ML (10ML) SYRINGE FOR IV PUSH (FOR BLOOD PRESSURE SUPPORT)
PREFILLED_SYRINGE | INTRAVENOUS | Status: DC | PRN
  Administered 2023-05-16: 160 ug via INTRAVENOUS
  Administered 2023-05-16: 80 ug via INTRAVENOUS
  Administered 2023-05-16: 160 ug via INTRAVENOUS

## 2023-05-16 MED ORDER — LIDOCAINE 2% (20 MG/ML) 5 ML SYRINGE
INTRAMUSCULAR | Status: DC | PRN
  Administered 2023-05-16: 60 mg via INTRAVENOUS

## 2023-05-16 MED ORDER — EPHEDRINE 5 MG/ML INJ
INTRAVENOUS | Status: AC
  Filled 2023-05-16: qty 5

## 2023-05-16 MED ORDER — SULFAMETHOXAZOLE-TRIMETHOPRIM 800-160 MG PO TABS: 1.0000 | ORAL_TABLET | Freq: Two times a day (BID) | ORAL | 0 refills | Status: DC

## 2023-05-16 MED ORDER — EPHEDRINE SULFATE-NACL 50-0.9 MG/10ML-% IV SOSY
PREFILLED_SYRINGE | INTRAVENOUS | Status: DC | PRN
  Administered 2023-05-16: 5 mg via INTRAVENOUS
  Administered 2023-05-16: 10 mg via INTRAVENOUS
  Administered 2023-05-16: 1 mg via INTRAVENOUS

## 2023-05-16 SURGICAL SUPPLY — 31 items
BAG DRAIN URO-CYSTO SKYTR STRL (DRAIN) ×2 IMPLANT
BAG DRN UROCATH (DRAIN) ×1
BASKET LASER NITINOL 1.9FR (BASKET) IMPLANT
BSKT STON RTRVL 120 1.9FR (BASKET) ×1
CATH URETL OPEN END 6FR 70 (CATHETERS) IMPLANT
CLOTH BEACON ORANGE TIMEOUT ST (SAFETY) ×2 IMPLANT
COVER DOME SNAP 22 D (MISCELLANEOUS) IMPLANT
DRSG TEGADERM 2-3/8X2-3/4 SM (GAUZE/BANDAGES/DRESSINGS) IMPLANT
GLOVE BIO SURGEON STRL SZ7.5 (GLOVE) ×2 IMPLANT
GLOVE BIOGEL PI IND STRL 6.5 (GLOVE) IMPLANT
GLOVE SURG SS PI 6.5 STRL IVOR (GLOVE) IMPLANT
GOWN STRL REUS W/ TWL LRG LVL3 (GOWN DISPOSABLE) IMPLANT
GOWN STRL REUS W/TWL LRG LVL3 (GOWN DISPOSABLE) ×3 IMPLANT
GUIDEWIRE ANG ZIPWIRE 038X150 (WIRE) ×2 IMPLANT
GUIDEWIRE STR DUAL SENSOR (WIRE) ×2 IMPLANT
IV NS 1000ML (IV SOLUTION) ×1
IV NS 1000ML BAXH (IV SOLUTION) ×2 IMPLANT
IV NS IRRIG 3000ML ARTHROMATIC (IV SOLUTION) ×2 IMPLANT
KIT TURNOVER CYSTO (KITS) ×2 IMPLANT
MANIFOLD NEPTUNE II (INSTRUMENTS) ×2 IMPLANT
NS IRRIG 500ML POUR BTL (IV SOLUTION) ×2 IMPLANT
PACK CYSTO (CUSTOM PROCEDURE TRAY) ×2 IMPLANT
SHEATH NAVIGATOR HD 11/13X36 (SHEATH) IMPLANT
SLEEVE SCD COMPRESS KNEE MED (STOCKING) ×2 IMPLANT
STENT POLARIS 5FRX26 (STENTS) IMPLANT
SYR 10ML LL (SYRINGE) ×2 IMPLANT
TRACTIP FLEXIVA PULS ID 200XHI (Laser) IMPLANT
TRACTIP FLEXIVA PULSE ID 200 (Laser) ×1
TUBE CONNECTING 12X1/4 (SUCTIONS) ×2 IMPLANT
TUBE FEEDING 8FR 16IN STR KANG (MISCELLANEOUS) IMPLANT
TUBING UROLOGY SET (TUBING) ×2 IMPLANT

## 2023-05-16 NOTE — Anesthesia Preprocedure Evaluation (Signed)
Anesthesia Evaluation  Patient identified by MRN, date of birth, ID band Patient awake    Reviewed: Allergy & Precautions, H&P , NPO status , Patient's Chart, lab work & pertinent test results  Airway Mallampati: II   Neck ROM: full    Dental   Pulmonary former smoker H/o PE. Takes eliquis   breath sounds clear to auscultation       Cardiovascular hypertension, + Peripheral Vascular Disease, +CHF and + DVT   Rhythm:regular Rate:Normal     Neuro/Psych    GI/Hepatic hiatal hernia,GERD  ,,  Endo/Other    Renal/GU Renal disease     Musculoskeletal  (+) Arthritis ,    Abdominal   Peds  Hematology   Anesthesia Other Findings   Reproductive/Obstetrics                             Anesthesia Physical Anesthesia Plan  ASA: 3  Anesthesia Plan: General   Post-op Pain Management:    Induction: Intravenous  PONV Risk Score and Plan: 2 and Dexamethasone, Ondansetron and Treatment may vary due to age or medical condition  Airway Management Planned: LMA  Additional Equipment:   Intra-op Plan:   Post-operative Plan: Extubation in OR  Informed Consent: I have reviewed the patients History and Physical, chart, labs and discussed the procedure including the risks, benefits and alternatives for the proposed anesthesia with the patient or authorized representative who has indicated his/her understanding and acceptance.     Dental advisory given  Plan Discussed with: CRNA, Anesthesiologist and Surgeon  Anesthesia Plan Comments:        Anesthesia Quick Evaluation

## 2023-05-16 NOTE — Discharge Instructions (Addendum)
1 - You may have urinary urgency (bladder spasms) and bloody urine on / off with stent in place. This is normal.  2 - Remove tethered stents on Monday morning at home by pulling on strings, then blue-white plastic tubing, and discarding. There are TWO stents.   3 - Call MD or go to ER for fever >102, severe pain / nausea / vomiting not relieved by medications, or acute change in medical status    CYSTOSCOPY HOME CARE INSTRUCTIONS  Activity: Rest for the remainder of the day.  Do not drive or operate equipment today.  You may resume normal activities in one to two days as instructed by your physician.   Meals: Drink plenty of liquids and eat light foods such as gelatin or soup this evening.  You may return to a normal meal plan tomorrow.  Return to Work: You may return to work in one to two days or as instructed by your physician.  Special Instructions / Symptoms: Call your physician if any of these symptoms occur:   -persistent or heavy bleeding  -bleeding which continues after first few urination  -large blood clots that are difficult to pass  -urine stream diminishes or stops completely  -fever equal to or higher than 101 degrees Farenheit.  -cloudy urine with a strong, foul odor  -severe pain  Females should always wipe from front to back after elimination.  You may feel some burning pain when you urinate.  This should disappear with time.  Applying moist heat to the lower abdomen or a hot tub bath may help relieve the pain. \  Follow-Up / Date of Return Visit to Your Physician:   Call for an appointment to arrange follow-up.     Post Anesthesia Home Care Instructions  Activity: Get plenty of rest for the remainder of the day. A responsible individual must stay with you for 24 hours following the procedure.  For the next 24 hours, DO NOT: -Drive a car -Advertising copywriter -Drink alcoholic beverages -Take any medication unless instructed by your physician -Make any  legal decisions or sign important papers.  Meals: Start with liquid foods such as gelatin or soup. Progress to regular foods as tolerated. Avoid greasy, spicy, heavy foods. If nausea and/or vomiting occur, drink only clear liquids until the nausea and/or vomiting subsides. Call your physician if vomiting continues.  Special Instructions/Symptoms: Your throat may feel dry or sore from the anesthesia or the breathing tube placed in your throat during surgery. If this causes discomfort, gargle with warm salt water. The discomfort should disappear within 24 hours.         [facility] [phone] Post Ureteroscopy With or Without Stent Instructions  Definitions:  Ureter: The duct that transports urine from the kidney to the bladder. Stent:   A plastic hollow tube that is placed into the ureter, from the kidney to the bladder to prevent the ureter from swelling shut.  GENERAL INSTRUCTIONS:  Despite the fact that no skin incisions were used, the area around the ureter and bladder is raw and irritated. The stent is a foreign body which will further irritate the bladder wall. This irritation is manifested by increased frequency of urination, both day and night, and by an increase in the urge to urinate. In some, the urge to urinate is present almost always. Sometimes the urge is strong enough that you may not be able to stop yourself from urinating. The only real cure is to remove the stent and then  give time for the bladder wall to heal which can't be done until the danger of the ureter swelling shut has passed, which varies.  You may see some blood in your urine while the stent is in place and a few days afterwards. Do not be alarmed, even if the urine was clear for a while. Get off your feet and drink lots of fluids until clearing occurs. If you start to pass clots or don't improve, call us.  DIET: You may return to your normal diet immediately. Because of the raw surface of  your bladder, alcohol, spicy foods, acid type foods and drinks with caffeine may cause irritation or frequency and should be used in moderation. To keep your urine flowing freely and to avoid constipation, drink plenty of fluids during the day ( 8-10 glasses ). Tip: Avoid cranberry juice because it is very acidic.  ACTIVITY: Your physical activity doesn't need to be restricted. However, if you are very active, you may see some blood in your urine. We suggest that you reduce your activity under these circumstances until the bleeding has stopped.  BOWELS: It is important to keep your bowels regular during the postoperative period. Straining with bowel movements can cause bleeding. A bowel movement every other day is reasonable. Use a mild laxative if needed, such as Milk of Magnesia 2-3 tablespoons, or 2 Dulcolax tablets. Call if you continue to have problems. If you have been taking narcotics for pain, before, during or after your surgery, you may be constipated. Take a laxative if necessary.   MEDICATION: You should resume your pre-surgery medications unless told not to. In addition you will often be given an antibiotic to prevent infection. These should be taken as prescribed until the bottles are finished unless you are having an unusual reaction to one of the drugs.  PROBLEMS YOU SHOULD REPORT TO [name]: Fevers over 100.5 Fahrenheit. Heavy bleeding, or clots ( See above notes about blood in urine ). Inability to urinate. Drug reactions ( hives, rash, nausea, vomiting, diarrhea ). Severe burning or pain with urination that is not improving.  FOLLOW-UP: You will need a follow-up appointment to monitor your progress. Call for this appointment at the number listed above. Usually the first appointment will be about three to fourteen days after your surgery.

## 2023-05-16 NOTE — Transfer of Care (Signed)
Immediate Anesthesia Transfer of Care Note  Patient: Bruce Ball  Procedure(s) Performed: CYSTOSCOPY WITH RETROGRADE PYELOGRAM, URETEROSCOPY AND STENT PLACEMENT (Bilateral: Ureter) HOLMIUM LASER APPLICATION (Bilateral: Ureter)  Patient Location: PACU  Anesthesia Type:General  Level of Consciousness: drowsy and responds to stimulation  Airway & Oxygen Therapy: Patient Spontanous Breathing  Post-op Assessment: Report given to RN and Post -op Vital signs reviewed and stable  Post vital signs: Reviewed and stable  Last Vitals:  Vitals Value Taken Time  BP 151/76 05/16/23 1105  Temp    Pulse 63 05/16/23 1108  Resp 14 05/16/23 1108  SpO2 96 % 05/16/23 1108  Vitals shown include unvalidated device data.  Last Pain:  Vitals:   05/16/23 0853  TempSrc:   PainSc: 0-No pain         Complications: No notable events documented.

## 2023-05-16 NOTE — Op Note (Signed)
NAME: Bruce Ball, Bruce Ball MEDICAL RECORD NO: [id number] ACCOUNT NO: [id number] DATE OF BIRTH: [birth year] FACILITY: [facility] LOCATION: [facility] PHYSICIAN: Sebastian Ache, MD  Operative Report   DATE OF PROCEDURE: 05/16/2023  PREOPERATIVE DIAGNOSIS:  Bilateral renal stones, history of recurrent urinary tract infections.  PROCEDURE PERFORMED:   1.  Cystoscopy with bilateral retrograde pyelogram, interpretation. 2.  Bilateral ureteroscopy with laser lithotripsy and basket extraction. 3.  Bilateral ureteral stent placement.  ESTIMATED BLOOD LOSS:  Nil.  COMPLICATIONS:  None.  SPECIMEN:  Bilateral renal stone fragments for composition analysis.  FINDINGS:   1.  Left greater than right, mostly papillary tip calcifications with some nephrocalcinosis changes. 2.  Partially duplex left kidney. 3.  Successful placement of bilateral ureteral stents, left upper pole moiety, right renal pelvis with tether.  INDICATIONS:  The patient is a 77 year old man with history of prostate cancer treated elsewhere with total incontinence following this.  He also has a history of recurrent urolithiasis on medical management, when he is compliant with this he has had  several recent bouts of clonal Klebsiella cystitis, on several occasions he empties the bladder completely, renal imaging did reveal some bilateral renal stones, raising suspicion for possibility of colonization of the stones.  Options were discussed  including continued care versus stone removal with goal of stone free to prevent future colic obstruction and hopefully reduce his infectious nidus and he wished to proceed.  Informed consent was obtained and placed in medical record.  PROCEDURE IN DETAIL:  The patient being identified and verified. Procedure being bilateral ureteroscopic stone manipulation was confirmed.  Procedure timeout was performed.  Intravenous antibiotics were administered.  General anesthesia was induced.  The  patient was  placed in low lithotomy position.  Sterile field was created, prepped and draped the patient's penis, perineum, and proximal thighs using iodine.  Cystourethroscopy was performed using 21-French rigid cystoscope with offset lens per  standard, posterior urethra revealed surgical absence of the prostate.  Inspection of urinary bladder revealed no diverticula, calcifications or papillary lesions.  Right ureteral orifice was cannulated with 6-French end-hole catheter, and right  retrograde pyelogram was obtained.  Right retrograde pyelogram demonstrated a single right ureter, single system right kidney.  No filling defects or narrowing noted.  A ZIPwire was advanced at the level of upper pole, set aside as a safety wire.  Next, left retrograde pyelogram was  obtained.  Left retrograde pyelogram demonstrated duplex left ureter.  Left common ureter bifurcation likely below the level of the iliacs.  No filling defects or narrowing noted.  A ZIPwire was advanced to the level of the upper pole moiety set aside as a safety  wire.  An 8-French feeding tube placed in urinary bladder for pressure release.  Next, semirigid ureteroscopy performed distal two-thirds of the right ureter set alongside as a separate sensor working wire.  Next, semirigid ureteroscopy was performed of  the distal two-thirds of both the left upper and lower pole moiety ureters over a sensory working wire.  No mucosal abnormalities were found.  Semirigid scope was exchanged for a medium length ureteral access sheath to the level of the upper pole moiety  ureter on the left and flexible digital ureteroscopy was performed on this side, multifocal papillary tip calcifications.  Some are amenable to simple basketing, majority of which were essentially nephrocalcinosis changes.  These were then ablated using  holmium laser energy setting of 1.5 joules and 10 Hz, thus ablating all nephrocalcinosis changes on the upper pole moiety.  Access sheath was  then removed back under continuous vision and lower pole moiety ureter was cannulated with a sensory wire and  access sheath was positioned over this and flexible digital ureteroscopy was performed of the left lower pole moiety ureter.  Similarly, there were some areas of nephrocalcinosis changes, which were ablated with holmium laser energy. Access sheath was  removed under continuous vision, no significant mucosal abnormalities were found.  Next, the access sheath was placed over the right sensory working wire to the level of proximal right ureter using continuous fluoroscopic guidance and flexible digital  ureteroscopy was performed there. There are multifocal papillary tip calcifications noted again most of these were amenable to simple basketing.  There was less nephrocalcinosis changes on the right side and similarly these areas were ablated with laser  lithotripsy.  Following this, complete resolution of all accessible stone fragments larger than one-third mm there was perforation.  Access sheath was removed on the right side using continuous vision.  No mucosal abnormalities were found.  Given the  bilateral nature of procedure today it was felt that brief interval stenting with tethered stent would be most prudent.  As such, new 5 x 26 Polaris type stents were placed bilaterally over the remaining safety wires into the right renal pelvis and to  the left upper pole moiety ureter.  Tether was left in place, fashioned together, trimmed to length.  Procedure was terminated.  The patient tolerated procedure well, no immediate perioperative complications.  The patient taken to postanesthesia care in  stable condition.  Plan for discharge home.   PUS D: 05/16/2023 11:01:53 am T: 05/16/2023 11:55:00 am  JOB: [id number]/ [id number]

## 2023-05-16 NOTE — Brief Op Note (Signed)
05/16/2023  10:55 AM  PATIENT:  Bruce Ball  77 y.o. male  PRE-OPERATIVE DIAGNOSIS:  BILATERAL RENAL STONES  POST-OPERATIVE DIAGNOSIS:  * No post-op diagnosis entered *  PROCEDURE:  Procedure(s) with comments: CYSTOSCOPY WITH RETROGRADE PYELOGRAM, URETEROSCOPY AND STENT PLACEMENT (Bilateral) - 90 MINS HOLMIUM LASER APPLICATION (Bilateral)  SURGEON:  Surgeon(s) and Role:    * Arneisha Kincannon, Delbert Phenix., MD - Primary  PHYSICIAN ASSISTANT:   ASSISTANTS: none   ANESTHESIA:   general  EBL:  2 mL   BLOOD ADMINISTERED:none  DRAINS: none   LOCAL MEDICATIONS USED:  NONE  SPECIMEN:  Source of Specimen:  bilateral renal stone fragments  DISPOSITION OF SPECIMEN:   [facility] for compositional analysis  COUNTS:  YES  TOURNIQUET:  * No tourniquets in log *  DICTATION: .Other Dictation: Dictation Number [id number]  PLAN OF CARE: Discharge to home after PACU  PATIENT DISPOSITION:  PACU - hemodynamically stable.   Delay start of Pharmacological VTE agent (>24hrs) due to surgical blood loss or risk of bleeding: yes

## 2023-05-16 NOTE — H&P (Signed)
Bruce Ball is an 77 y.o. male.    Chief Complaint: Pre-OP BILATERAL Ureteroscopic Stone Manipulation  HPI:   1- Recurrent Urolithiasis -  Pre 2020 many stones treated with SWL, URS, MET  05/2019 - Left ureteroscopy to stone free on left. About 1cm Rt intrarenal stone as well  02/2020 - BILATERAL ureteroscopy to stone free for Rt ureteral, Lt renal stones.  10/2022 - CT -L>R NON-obstructing renal stones (abtou 1.3cm left, 1cm rt total volume, improved from 2021)   2 - Medical Stone Disease -  Eval 2020: BMP, PTH, Urate - normal; Composition - 100% CaOx; 24 Hr Urines - unable to obtain as in diapers ==> increase PO citrate and daily indapamide 1.25 (later held for unclear reasons, may want to restart)   3- Prostate Cancer - s/p prostatectomy 2003 at [facility] and gets f/u there. PSA 2020 <0.01.   4 - Total Incontinence - diaper dependant with zero control after prostattectomy at [facility] 2003.   5 - Left Renal Partial Duplication - Left duplex kidney / ureteters to distal 1/3 of ureter noted at left ureteroscopy 2020.   6 - Gross Hematuria, Recurrent Cystitis - few epidosed gross hematuria late 2023. he is on eliquus. Ct with scattered non-obstructng renal stones, no masses. UCX klebsiella x 2 res amp but sens bactrim, gent, nitro, cipro. PVR 01/2023 normal.   PMH sig for Rt TKR, obesity, DJD, HTN. DVT/Eliquus. His PCP is Tana Conch, also gets some care [facility].   Today " Bruce Ball " is seen to proceed with BILATERAL ureteroscopic stone manipualtion for recurrent stones in setting of recurrent bacteruria. Has been on keflex pre-op accordign to CX. NO interval fevers.     Past Medical History:  Diagnosis Date   Anemia associated with chronic renal failure    At high risk for falls    due to gait disorders;   recurrent falls   (05-12-2023  per pt last fall 2 weeks ago)   Chronic anticoagulation    eliquis--- managed by pcp   Coronary artery calcification seen on CAT scan    followed by  dr t. turner---   05-31-2021  calcium score= 152   Deafness in right ear    ED (erectile dysfunction)    Edema of both lower extremities    Essential tremor    Gait disorder    imbalance when walking uses cane/ walker;  recurrent falls   GERD (gastroesophageal reflux disease)    Hematuria    Hiatal hernia    History of adenomatous polyp of colon    History of agent Orange exposure    History of basal cell carcinoma (BCC) excision    History of DVT of lower extremity 12/2019   admission in epic ;   w/ PE's all lobes   History of esophageal dilatation 12/2019   dr Adela Lank   History of kidney stones    History of peptic ulcer 2009   History of prostate cancer 12/2001   followed by dr Berneice Heinrich;   first dx 01/ 2003,  Gleason 3+3,   03-10-2002  s/p  radical prostatectomy pelvic lymph node disections , no recurrence   History of pulmonary embolus (PE) 12/2019   admission in epic;   submassive all lobes and dvt -lle   History of sarcoma of bone 1970   orthopedic oncologist--- dr w. 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 Mayford Knife;  monitor in epic 04-08-2023 low PVC burden and atrial ectopy   Renal calculi    bilateral   Stage 3 chronic kidney disease [facility])    nephrologist--- dr Ronalee Belts   Thyroid nodule 2016   endocrinologist--- dr Dewitt Hoes  [facility])  hx benign bx, right side   Urinary incontinence    total incontinence   Wears hearing aid in left ear     Past  Surgical History:  Procedure Laterality Date   CYSTOSCOPY WITH RETROGRADE PYELOGRAM, URETEROSCOPY AND STENT PLACEMENT Right 11/06/2017   Procedure: CYSTOSCOPY WITH  STENT PLACEMENT RIGHT;  Surgeon: Heloise Purpura, MD;  Location: [facility];  Service: Urology;  Laterality: Right;   CYSTOSCOPY WITH RETROGRADE PYELOGRAM, URETEROSCOPY AND STENT PLACEMENT Left 06/09/2019   Procedure: CYSTOSCOPY WITH RETROGRADE PYELOGRAM, URETEROSCOPY AND STENT PLACEMENT X2;  Surgeon: Sebastian Ache, MD;  Location: [facility];  Service: Urology;  Laterality: Left;   CYSTOSCOPY WITH RETROGRADE PYELOGRAM, URETEROSCOPY AND STENT PLACEMENT Bilateral 03/01/2020   Procedure: CYSTOSCOPY WITH RETROGRADE PYELOGRAM, URETEROSCOPY AND STENT PLACEMENT;  Surgeon: Sebastian Ache, MD;  Location: [facility];  Service: Urology;  Laterality: Bilateral;  75 MINS   CYSTOSCOPY/RETROGRADE/URETEROSCOPY  2020   at [facility] in [place]   CYSTOSCOPY/URETEROSCOPY/HOLMIUM LASER Right 11/24/2017   Procedure: CYSTOSCOPY/URETEROSCOPY/ RETROGRADE/STENT REMOVAL;  Surgeon: Heloise Purpura, MD;  Location: [facility];  Service: Urology;  Laterality: Right;   FEMORAL OSTEOTOMY W/ RODDING  1970   right proximal femUr resection replacement w/ custom prosthesis   FEMUR FRACTURE SURGERY Right 10/17/2021   @ [facility] by dr w. eward;  REVISION RIGHT PROXIMAL FEMUR REPLACEMENT IN SETTING OF DISTAL PERIPROSTHETIC FEMUR FRACTURE, BOTH COMPONENTS   FUNCTIONAL ENDOSCOPIC SINUS SURGERY  03/19/2023   @ [facility]   HOLMIUM LASER APPLICATION Bilateral 03/01/2020   Procedure: HOLMIUM LASER APPLICATION;  Surgeon: Sebastian Ache, MD;  Location: [facility];  Service: Urology;  Laterality: Bilateral;   LUMBAR SPINE SURGERY  07/2017   @ [facility]  by dr Newell Coral;    L5-- S1  laminectomy   POSTERIOR LUMBAR FUSION  06/28/2020   @ [facility] [place];   L5--S1   PROSTATECTOMY  03/10/2002   @[facility]  by dr Aldean Ast;    w/ pelvic lymph node dissection's   SPINAL CORD STIMULATOR REMOVAL   04/15/2016   W/   THORACIC PARTIAL LAMINECTOMY T9    Family History  Problem Relation Age of Onset   COPD Father    Hypertension Father    Prostate cancer Brother    Stomach cancer Brother        on chemo in 2022- in 41s developed   Depression Brother        suicide after brain stem injury after accident   Stroke Brother        51 years old   Esophageal cancer Neg Hx    Pancreatic cancer Neg Hx    Colon cancer Neg Hx    Social History:  reports that he quit smoking about 44 years ago. His smoking use included cigarettes.  He started smoking about 61 years ago. He has a 20.00 pack-year smoking history. He has never used smokeless tobacco. He reports that he does not drink alcohol and does not use drugs.  Allergies:  Allergies  Allergen Reactions   Tape Other (See Comments)    NEEDS COBAN WRAP- NO TAPE!!!!- SKIN TEARS VERY EASILY!!!!   Erythromycin Nausea And Vomiting   Lisinopril Other (See Comments)    Lowered the blood pressure TOO MUCH!!    No medications prior to admission.    No results found for this or any previous visit (from the past 48 hour(s)). No results found.  Review of Systems  Constitutional:  Negative for chills and fever.  Genitourinary:  Positive for difficulty urinating.  All other systems reviewed and are negative.   Height 5\' 11"  (1.803 m), weight 108.9 kg. Physical Exam Vitals reviewed.  HENT:     Head: Normocephalic.  Eyes:     Pupils: Pupils are equal, round, and reactive to light.  Cardiovascular:     Rate and Rhythm: Normal rate.  Abdominal:     General: Abdomen is flat.     Comments: Stabel moderate obesity  Genitourinary:    Comments: No CVAT at present Musculoskeletal:        General: Normal range of motion.     Cervical back: No rigidity.  Skin:    General: Skin is warm.  Neurological:     General: No focal deficit present.     Mental Status: He is alert.  Psychiatric:        Mood and Affect: Mood normal.       Assessment/Plan  Proceed as planned with BILATERAL ureteroscopic stone manipulation. Risks (incluidng infecitous), benefits, alternatives, expected peri-op course discussed previously and reiterated today.   Loletta Parish., MD 05/16/2023, 6:45 AM

## 2023-05-16 NOTE — Anesthesia Procedure Notes (Signed)
Procedure Name: LMA Insertion Date/Time: 05/16/2023 10:02 AM  Performed by: Bishop Limbo, CRNAPre-anesthesia Checklist: Patient identified, Emergency Drugs available, Suction available and Patient being monitored Patient Re-evaluated:Patient Re-evaluated prior to induction Oxygen Delivery Method: Circle System Utilized Preoxygenation: Pre-oxygenation with 100% oxygen Induction Type: IV induction Ventilation: Mask ventilation without difficulty LMA: LMA inserted LMA Size: 4.0 Number of attempts: 1 Placement Confirmation: positive ETCO2 Tube secured with: Tape Dental Injury: Teeth and Oropharynx as per pre-operative assessment

## 2023-05-19 ENCOUNTER — Encounter (HOSPITAL_BASED_OUTPATIENT_CLINIC_OR_DEPARTMENT_OTHER): Payer: Self-pay | Admitting: Urology

## 2023-05-19 NOTE — Anesthesia Postprocedure Evaluation (Signed)
Anesthesia Post Note  Patient: Bruce Ball  Procedure(s) Performed: CYSTOSCOPY WITH RETROGRADE PYELOGRAM, URETEROSCOPY AND STENT PLACEMENT (Bilateral: Ureter) HOLMIUM LASER APPLICATION (Bilateral: Ureter)     Patient location during evaluation: PACU Anesthesia Type: General Level of consciousness: awake and alert Pain management: pain level controlled Vital Signs Assessment: post-procedure vital signs reviewed and stable Respiratory status: spontaneous breathing, nonlabored ventilation, respiratory function stable and patient connected to nasal cannula oxygen Cardiovascular status: blood pressure returned to baseline and stable Postop Assessment: no apparent nausea or vomiting Anesthetic complications: no   No notable events documented.  Last Vitals:  Vitals:   05/16/23 1230 05/16/23 1245  BP: (!) 146/81 (!) 170/73  Pulse: 66 65  Resp: 19 18  Temp:  (!) 36.4 C  SpO2: 97% 97%    Last Pain:  Vitals:   05/16/23 1245  TempSrc:   PainSc: 0-No pain                 Rossie Bretado S

## 2023-05-29 DIAGNOSIS — Z9889 Other specified postprocedural states: Secondary | ICD-10-CM | POA: Diagnosis not present

## 2023-05-29 DIAGNOSIS — J324 Chronic pansinusitis: Secondary | ICD-10-CM | POA: Diagnosis not present

## 2023-05-29 DIAGNOSIS — J339 Nasal polyp, unspecified: Secondary | ICD-10-CM | POA: Diagnosis not present

## 2023-05-29 DIAGNOSIS — J343 Hypertrophy of nasal turbinates: Secondary | ICD-10-CM | POA: Diagnosis not present

## 2023-06-02 DIAGNOSIS — C61 Malignant neoplasm of prostate: Secondary | ICD-10-CM | POA: Diagnosis not present

## 2023-06-02 DIAGNOSIS — N202 Calculus of kidney with calculus of ureter: Secondary | ICD-10-CM | POA: Diagnosis not present

## 2023-06-02 DIAGNOSIS — R310 Gross hematuria: Secondary | ICD-10-CM | POA: Diagnosis not present

## 2023-06-03 ENCOUNTER — Telehealth: Payer: Self-pay | Admitting: Family Medicine

## 2023-06-03 NOTE — Telephone Encounter (Signed)
FYI: This call has been transferred to Access Nurse. Once the result note has been entered staff can address the message at that time.  Patient called in with the following symptoms:  Red Word:dizziness -imbalanced-sudden double vision   Please advise at Mobile [phone] (mobile)  Message is routed to Provider Pool and [facility] Triage

## 2023-06-03 NOTE — Telephone Encounter (Signed)
Spoke with Access nurse who advised pt to go to ED. Pt is refusing. Please advise

## 2023-06-03 NOTE — Telephone Encounter (Signed)
Formal recommendation is ER particularly with blurry vision If he declines can put on cancellation list or work him in next available- but I do have limited availability and for ER dispositions I cannot override for him to see another clinician within our group

## 2023-06-03 NOTE — Telephone Encounter (Signed)
See below

## 2023-06-03 NOTE — Telephone Encounter (Signed)
Patient Advised Go To ED Now  Patient Refused   Patient Name First: Bruce Last: Ball Gender: Male DOB: 18-Dec-1946 Age: 77 Y 11 M 18 D Return Phone Number: [phone] (Primary), [phone] (Secondary) Address: City/ State/ Zip: [place]  [address] [facility] - [facility], [facility] Provider Tana Conch- MD Contact Type Call Who Is Calling Patient / Member / Family / Caregiver Call Type Triage / Clinical Relationship To Patient Self Return Phone Number [phone] (Primary) Chief Complaint Dizziness Reason for Call Symptomatic / Request for Health Information Initial Comment Caller states she has double vision, unsteady and dizziness. Translation No Nurse Assessment Nurse: [name], RN, [name] Date/Time (Eastern Time): 06/03/2023 10:32:15 AM Confirm and document reason for call. If symptomatic, describe symptoms. ---Caller states he is dizzy. He has double vision. has had intermittent dizziness. had kidney stone surgery about 2 weeks ago and sinus surgery about 6 weeks ago. Does the patient have any new or worsening symptoms? ---Yes Will a triage be completed? ---Yes Related visit to physician within the last 2 weeks? ---No Does the PT have any chronic conditions? (i.e. diabetes, asthma, this includes High risk factors for pregnancy, etc.) ---Yes List chronic conditions. ---HTN; kidney stone surgery Is this a behavioral health or substance abuse call? ---No Guidelines Guideline Title Affirmed Question Affirmed Notes Nurse Date/Time [name] Time) Vision Loss or Change Double vision [name], RN, [name] 06/03/2023 10:36:05 AM  Disp. Time [name] Time) Disposition Final User 06/03/2023 10:39:25 AM Go to ED Now (or PCP triage) Yes [name], RN, [name] Final Disposition 06/03/2023 10:39:25 AM Go to ED Now (or PCP triage) Yes [name], RN, [name] Disagree/Comply  Disagree Caller Understands Yes PreDisposition Call Doctor Care Advice Given Per Guideline GO TO ED NOW (OR PCP TRIAGE): * IF NO PCP (PRIMARY CARE PROVIDER) SECOND-LEVEL TRIAGE: You need to be seen within the next hour. Go to the ED/UCC at _____________ Hospital. Leave as soon as you can. CARE ADVICE given per Vision Loss or Change (Adult) guideline. ANOTHER ADULT SHOULD DRIVE: * It is better and safer if another adult drives instead of you.  Comments User: [name], RN Date/Time [name] Time): 06/03/2023 10:41:08 AM Called back line and spoke to [name] and gave report that pt has double vision and is dizzy. Triage outcome go to ER now (or PCP triage) but pt does not want to go. Referrals GO TO FACILITY REFUSED

## 2023-06-04 NOTE — Telephone Encounter (Signed)
LVM to go to ED or make next available ov with [facility] & add to wait list.

## 2023-06-04 NOTE — Telephone Encounter (Signed)
See below about placing pt on cancellation list and scheduling next available OV.

## 2023-06-05 ENCOUNTER — Ambulatory Visit (HOSPITAL_BASED_OUTPATIENT_CLINIC_OR_DEPARTMENT_OTHER): Payer: Medicare Other | Attending: Family Medicine | Admitting: Physical Therapy

## 2023-06-05 DIAGNOSIS — M25551 Pain in right hip: Secondary | ICD-10-CM | POA: Diagnosis not present

## 2023-06-05 DIAGNOSIS — M6281 Muscle weakness (generalized): Secondary | ICD-10-CM | POA: Insufficient documentation

## 2023-06-05 DIAGNOSIS — R278 Other lack of coordination: Secondary | ICD-10-CM | POA: Diagnosis not present

## 2023-06-05 DIAGNOSIS — R262 Difficulty in walking, not elsewhere classified: Secondary | ICD-10-CM | POA: Diagnosis not present

## 2023-06-05 NOTE — Therapy (Signed)
OUTPATIENT PHYSICAL THERAPY TREATMENT NOTE   Patient Name: Bruce Ball MRN: [id number] DOB:Nov 17, 1946, 77 y.o., male Today's Date: 06/06/2023  END OF SESSION:  PT End of Session - 06/05/23 1545     Visit Number 5    Number of Visits 16    Date for PT Re-Evaluation 07/11/23    Authorization Type MCR A and B    PT Start Time 1543    PT Stop Time 1626    PT Time Calculation (min) 43 min    Equipment Utilized During Treatment Gait belt    Activity Tolerance Patient tolerated treatment well    Behavior During Therapy WFL for tasks assessed/performed              Past Medical History:  Diagnosis Date   Anemia associated with chronic renal failure    At high risk for falls    due to gait disorders;   recurrent falls   (05-12-2023  per pt last fall 2 weeks ago)   Chronic anticoagulation    eliquis--- managed by pcp   Coronary artery calcification seen on CAT scan    followed by dr t. turner---   05-31-2021  calcium score= 152   Deafness in right ear    ED (erectile dysfunction)    Edema of both lower extremities    Essential tremor    Gait disorder    imbalance when walking uses cane/ walker;  recurrent falls   GERD (gastroesophageal reflux disease)    Hematuria    Hiatal hernia    History of adenomatous polyp of colon    History of agent Orange exposure    History of basal cell carcinoma (BCC) excision    History of DVT of lower extremity 12/2019   admission in epic ;   w/ PE's all lobes   History of esophageal dilatation 12/2019   dr Adela Lank   History of kidney stones    History of peptic ulcer 2009   History of prostate cancer 12/2001   followed by dr Berneice Heinrich;   first dx 01/ 2003,  Gleason 3+3,   03-10-2002  s/p  radical prostatectomy pelvic lymph node disections , no recurrence   History of pulmonary embolus (PE) 12/2019   admission in epic;   submassive all lobes and dvt -lle   History of sarcoma of bone 1970   orthopedic oncologist--- dr w. 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 turner;  monitor in epic 04-08-2023 low PVC burden and atrial ectopy   Renal calculi    bilateral   Stage 3 chronic kidney disease [facility])    nephrologist--- dr Ronalee Belts   Thyroid nodule 2016   endocrinologist--- dr Dewitt Hoes  [facility])  hx benign bx, right side   Urinary incontinence    total incontinence   Wears hearing aid in left ear    Past Surgical History:  Procedure Laterality Date   CYSTOSCOPY WITH RETROGRADE PYELOGRAM,  URETEROSCOPY AND STENT PLACEMENT Right 11/06/2017   Procedure: CYSTOSCOPY WITH  STENT PLACEMENT RIGHT;  Surgeon: Heloise Purpura, MD;  Location: [facility];  Service: Urology;  Laterality: Right;   CYSTOSCOPY WITH RETROGRADE PYELOGRAM, URETEROSCOPY AND STENT PLACEMENT Left 06/09/2019   Procedure: CYSTOSCOPY WITH RETROGRADE PYELOGRAM, URETEROSCOPY AND STENT PLACEMENT X2;  Surgeon: Sebastian Ache, MD;  Location: [facility];  Service: Urology;  Laterality: Left;   CYSTOSCOPY WITH RETROGRADE PYELOGRAM, URETEROSCOPY AND STENT PLACEMENT Bilateral 03/01/2020   Procedure: CYSTOSCOPY WITH RETROGRADE PYELOGRAM, URETEROSCOPY AND STENT PLACEMENT;  Surgeon: Sebastian Ache, MD;  Location: [facility];  Service: Urology;  Laterality: Bilateral;  75 MINS   CYSTOSCOPY WITH RETROGRADE PYELOGRAM, URETEROSCOPY AND STENT PLACEMENT Bilateral 05/16/2023   Procedure: CYSTOSCOPY WITH RETROGRADE PYELOGRAM, URETEROSCOPY AND STENT PLACEMENT;  Surgeon: Loletta Parish., MD;  Location: [facility];  Service: Urology;  Laterality: Bilateral;  90 MINS   CYSTOSCOPY/RETROGRADE/URETEROSCOPY  2020   at [facility] in [place]   CYSTOSCOPY/URETEROSCOPY/HOLMIUM LASER Right 11/24/2017   Procedure: CYSTOSCOPY/URETEROSCOPY/ RETROGRADE/STENT REMOVAL;  Surgeon: Heloise Purpura, MD;  Location: [facility];  Service: Urology;  Laterality: Right;   FEMORAL OSTEOTOMY W/ RODDING  1970   right proximal femUr resection replacement w/ custom prosthesis   FEMUR FRACTURE SURGERY Right 10/17/2021   @ [facility] by dr w. eward;  REVISION RIGHT PROXIMAL FEMUR REPLACEMENT IN SETTING OF DISTAL PERIPROSTHETIC FEMUR FRACTURE, BOTH COMPONENTS   FUNCTIONAL ENDOSCOPIC SINUS SURGERY  03/19/2023   @ [facility]   HOLMIUM LASER APPLICATION Bilateral 03/01/2020   Procedure: HOLMIUM LASER APPLICATION;  Surgeon: Sebastian Ache, MD;  Location: [facility];  Service: Urology;  Laterality: Bilateral;   HOLMIUM LASER APPLICATION Bilateral 05/16/2023   Procedure: HOLMIUM LASER APPLICATION;  Surgeon: Loletta Parish., MD;  Location: [facility];  Service: Urology;  Laterality: Bilateral;   LUMBAR SPINE SURGERY  07/2017   @ [facility]  by dr Newell Coral;    L5-- S1  laminectomy   POSTERIOR LUMBAR FUSION  06/28/2020   @ [facility] [place];   L5--S1   PROSTATECTOMY  03/10/2002   @[facility]  by dr Aldean Ast;    w/ pelvic lymph node dissection's   SPINAL CORD STIMULATOR REMOVAL  04/15/2016   W/   THORACIC PARTIAL LAMINECTOMY T9   Patient Active Problem List   Diagnosis Date Noted   UTI due to Klebsiella species 11/11/2022   Basal cell carcinoma of scalp 09/16/2022   Deposits (accretions) on teeth 09/16/2022   Nontoxic single thyroid nodule 09/16/2022   Thyroid nodule 09/16/2022   Sensorineural hearing loss, bilateral 09/16/2022   Post-COVID syndrome 09/16/2022   Chronic pansinusitis 07/11/2022   Non-seasonal allergic rhinitis 06/11/2022   Abrasion of right arm  11/19/2021   Femur fracture, right (HCC) 10/17/2021   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HCC) 10/16/2021   History of pulmonary embolism 10/10/2021   Chronic diastolic CHF (congestive heart failure) (HCC) 10/10/2021   CKD (chronic kidney disease), stage III (HCC) 10/10/2021   Hyperlipidemia 10/10/2021   CAD (coronary artery disease) 04/03/2021   Chronic anticoagulation 03/26/2021   Acquired coagulation disorder (HCC) 12/12/2020   Senile purpura (HCC) 12/12/2020   Meibomian gland dysfunction of right eye, unspecified eyelid 07/11/2020   Vitamin B12 deficient megaloblastic anemia 07/11/2020   Aortic atherosclerosis (HCC) 05/17/2020   B12 deficiency 05/17/2020   Iliac artery aneurysm (HCC) 05/17/2020   Osteoporosis 04/04/2020   Idiopathic neuropathy 01/28/2020   Recurrent falls 01/28/2020   GERD (gastroesophageal reflux disease) 12/22/2019   Essential tremor 12/22/2019  Kidney stone 11/05/2017   History of total right hip replacement 03/08/2017   History of prostate cancer 03/08/2017   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   Vertigo 03/01/2017   High risk medication use 02/28/2017   DDD (degenerative disc disease), thoracic 02/28/2017   Ataxia 05/18/2016   Vestibular disequilibrium 05/18/2016   Failed spinal cord stimulator (HCC) 03/20/2016   Lung nodule, solitary 05/24/2014   Peripheral edema 06/08/2012   Backache 04/21/2008   History of peptic ulcer disease 04/21/2008   Essential hypertension 10/29/2007     REFERRING PROVIDER: Shelva Majestic, MD  REFERRING DIAG: R26.89 (ICD-10-CM) - Balance disorder            R53.1 (ICD-10-CM) - Generalized weakness  THERAPY DIAG:  Muscle weakness (generalized)  Other lack of coordination  Pain in right hip  Difficulty in walking, not elsewhere classified  Rationale for Evaluation and Treatment: Rehabilitation  ONSET DATE: PT order 04/03/2023 / Fall with Fx and surgery in Oct 2022  SUBJECTIVE:   SUBJECTIVE STATEMENT: Pt states he may have  done too much yesterday.   He sidestepped in the pool for 1.5 hours yesterday.  He had increased pain later in the evening.   Pt states he loses his balance multiple times per day though has had no LOB.  Pt states he saw MD on Monday and he released him to full activity and he's fine to resume PT.   PERTINENT HISTORY: -Deafness in R ear  -R Hip surgery/proximal femur replacement many years ago with THA revision in the setting of a periprosthetic distal femur fx 10/17/2021  -L5-S1 fusion 2021, OA R knee, neuropathy, Dizziness/vertigo, PE 2021, HTN, and CKD   PAIN:  Are you having pain?  NPRS:  5/10 Location:  R lateral hip   PRECAUTIONS: Fall and Other: R THA revision, Lumbar fusion, neuropathy  WEIGHT BEARING RESTRICTIONS: No  FALLS:  Has patient fallen in last 6 months? Yes. Number of falls 8  LIVING ENVIRONMENT: Lives with: lives with their spouse Lives in: 2 story home Stairs: yes, but pt states he doesn't do stairs. Has following equipment at home: rollator, cane, electric scooter  OCCUPATION: retired  PLOF: Independent  PATIENT GOALS: improve balance and strength.  Return to the level he was in Oct with PT.    OBJECTIVE:   DIAGNOSTIC FINDINGS: Pt had x rays on R hip which are unable to view in Epic.     TODAY'S TREATMENT:                                                                                                                               Therapeutic Exercise: -Attempted Nustep though pt unable to perform due to pain. -LAQ with 2# 3x10 R LE -Supine bridges with GTB around knees 3x10 -Pt attempted mini squats with bilat UE support.  PT educated pt in correct form though pt unable to demo correct form.  Pt had pain with last rep. -Step  ups on 4 inch step x 10 reps with bilat UE support with CGA   Neuro Re-ed activities:  Standing on airex with NBOS with CGA 2x30 sec  Standing in a staggered stance x 30 sec without UE support  Marching on airex with UE  support with CGA 2 x 10 reps       Manual Therapy:  Pt is very tender with palpation of R lateral quad and minimal to moderately tender in central quad.   PT used roller on R quad in supine to improve soft tissue tightness and mobility and pain.        PATIENT EDUCATION:  Education details:  dx, exercise form, relevant anatomy, exercise rationale, and POC.  Person educated: Patient Education method: Explanation, demonstration, verbal and tactile cues Education comprehension: verbalized understanding, returned demonstration, verbal and tactile cues required  HOME EXERCISE PROGRAM: Pt has a HEP.   ASSESSMENT:  CLINICAL IMPRESSION: Pt continues to have hip pain and is following up with [facility] next week.  PT states he did 1.5 hours of sidestepping in the pool and had increased pain later.  PT educated pt that was an excessive amount of time to perform sidestepping and that he should vary his activity in the pool including walking in the pool.  He was unable to perform Nustep due to increased pain.  Pt attempted mini squats with bilat UE support.  PT educated pt in correct form though pt unable to demo correct form.  PT had pt stop performing mini squats and pt had pain with last rep which went away with resting.  Pt continues to have increased soft tissue tightness and tenderness in quad which is worse in lateral quad.  Pt reports improved pain and sx's after rolling quad.  He states he feels better after Rx and reports improved pain from 5/10 before Rx to 2-3/10 after Rx.  OBJECTIVE IMPAIRMENTS: Abnormal gait, decreased activity tolerance, decreased balance, decreased endurance, decreased mobility, difficulty walking, decreased strength, and pain.   ACTIVITY LIMITATIONS: standing, squatting, stairs, transfers, and locomotion level  PARTICIPATION LIMITATIONS: cleaning, shopping, and community activity  PERSONAL FACTORS: 3+ comorbidities: R THA revision, Lumbar fusion, neuropathy, vertigo, R  knee OA  are also affecting patient's functional outcome.   REHAB POTENTIAL: Good  CLINICAL DECISION MAKING: Evolving/moderate complexity  EVALUATION COMPLEXITY: Moderate   GOALS:   SHORT TERM GOALS: Target date: 05/30/2023  Pt will be independent and compliant with HEP for improved pain, strength, ROM and function.   Baseline: Goal status: INITIAL  2.  Pt will tolerate aquatic therapy without adverse effects for improved mobility, balance, and strength.   Baseline:  Goal status: INITIAL  3.  Pt will report at least a 50% improvement in R LE giving way/knee buckling and stability with gait/mobility.  Baseline:  Goal status: INITIAL  4.  Pt will have no difficulty with car transfers.   Baseline:  Goal status: INITIAL    LONG TERM GOALS: Target date: 07/11/2023  Pt will deny having any falls.  Baseline:  Goal status: INITIAL  2.  Pt will perform 5x STS test in no > 13 sec for improved functional LE strength and performance of transfers.   Baseline:  Goal status: INITIAL  3.  Pt will be able to perform tandem stance for 20 seconds for improved balance and proprioception with daily mobility.   Baseline:  Goal status: INITIAL  4.  Pt will ambulate extended community distance with AD with good stability and without significant  pain or difficulty.  Baseline:  Goal status: INITIAL  5.  Pt will demo improved R LE strength  to 5/5 in R knee extension, 4+/5 in R DF, and by improved tolerance with resistance with hip flexion and hip abduction for improved performance of functional mobility.  Baseline:  Goal status: INITIAL     PLAN:  PT FREQUENCY: 2x/week  PT DURATION: other: 10 weeks  PLANNED INTERVENTIONS: Therapeutic exercises, Therapeutic activity, Neuromuscular re-education, Balance training, Gait training, Patient/Family education, Joint mobilization, Stair training, Aquatic Therapy, Electrical stimulation, Cryotherapy, Moist heat, Taping, and Manual therapy    PLAN FOR NEXT SESSION:   Cont with strengthening, balance training, gait training, and manual techniques.   Audie Clear III PT, DPT 06/06/23 4:14 PM

## 2023-06-06 ENCOUNTER — Encounter (HOSPITAL_BASED_OUTPATIENT_CLINIC_OR_DEPARTMENT_OTHER): Payer: Self-pay | Admitting: Physical Therapy

## 2023-06-07 ENCOUNTER — Ambulatory Visit (HOSPITAL_BASED_OUTPATIENT_CLINIC_OR_DEPARTMENT_OTHER): Payer: Medicare Other

## 2023-06-07 ENCOUNTER — Encounter (HOSPITAL_BASED_OUTPATIENT_CLINIC_OR_DEPARTMENT_OTHER): Payer: Self-pay

## 2023-06-07 DIAGNOSIS — R262 Difficulty in walking, not elsewhere classified: Secondary | ICD-10-CM | POA: Diagnosis not present

## 2023-06-07 DIAGNOSIS — M25551 Pain in right hip: Secondary | ICD-10-CM

## 2023-06-07 DIAGNOSIS — R278 Other lack of coordination: Secondary | ICD-10-CM | POA: Diagnosis not present

## 2023-06-07 DIAGNOSIS — M6281 Muscle weakness (generalized): Secondary | ICD-10-CM | POA: Diagnosis not present

## 2023-06-07 NOTE — Therapy (Addendum)
 OUTPATIENT PHYSICAL THERAPY TREATMENT NOTE   Patient Name: Bruce Ball MRN: [id number] DOB:01-May-1946, 77 y.o., male Today's Date: 06/07/2023  END OF SESSION:  PT End of Session - 06/07/23 1106     Visit Number 6    Number of Visits 16    Date for PT Re-Evaluation 07/11/23    Authorization Type MCR A and B    PT Start Time 1045    PT Stop Time 1125    PT Time Calculation (min) 40 min    Equipment Utilized During Treatment Gait belt    Activity Tolerance Patient tolerated treatment well    Behavior During Therapy WFL for tasks assessed/performed               Past Medical History:  Diagnosis Date   Anemia associated with chronic renal failure    At high risk for falls    due to gait disorders;   recurrent falls   (05-12-2023  per pt last fall 2 weeks ago)   Chronic anticoagulation    eliquis--- managed by pcp   Coronary artery calcification seen on CAT scan    followed by dr t. turner---   05-31-2021  calcium score= 152   Deafness in right ear    ED (erectile dysfunction)    Edema of both lower extremities    Essential tremor    Gait disorder    imbalance when walking uses cane/ walker;  recurrent falls   GERD (gastroesophageal reflux disease)    Hematuria    Hiatal hernia    History of adenomatous polyp of colon    History of agent Orange exposure    History of basal cell carcinoma (BCC) excision    History of DVT of lower extremity 12/2019   admission in epic ;   w/ PE's all lobes   History of esophageal dilatation 12/2019   dr Adela Lank   History of kidney stones    History of peptic ulcer 2009   History of prostate cancer 12/2001   followed by dr Berneice Heinrich;   first dx 01/ 2003,  Gleason 3+3,   03-10-2002  s/p  radical prostatectomy pelvic lymph node disections , no recurrence   History of pulmonary embolus (PE) 12/2019   admission in epic;   submassive all lobes and dvt -lle   History of sarcoma of bone 1970   orthopedic oncologist--- dr w. 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 turner;  monitor in epic 04-08-2023 low PVC burden and atrial ectopy   Renal calculi    bilateral   Stage 3 chronic kidney disease [facility])    nephrologist--- dr Ronalee Belts   Thyroid nodule 2016   endocrinologist--- dr Dewitt Hoes  [facility])  hx benign bx, right side   Urinary incontinence    total incontinence   Wears hearing aid in left ear    Past Surgical History:  Procedure Laterality Date   CYSTOSCOPY WITH RETROGRADE  PYELOGRAM, URETEROSCOPY AND STENT PLACEMENT Right 11/06/2017   Procedure: CYSTOSCOPY WITH  STENT PLACEMENT RIGHT;  Surgeon: Heloise Purpura, MD;  Location: [facility];  Service: Urology;  Laterality: Right;   CYSTOSCOPY WITH RETROGRADE PYELOGRAM, URETEROSCOPY AND STENT PLACEMENT Left 06/09/2019   Procedure: CYSTOSCOPY WITH RETROGRADE PYELOGRAM, URETEROSCOPY AND STENT PLACEMENT X2;  Surgeon: Sebastian Ache, MD;  Location: [facility];  Service: Urology;  Laterality: Left;   CYSTOSCOPY WITH RETROGRADE PYELOGRAM, URETEROSCOPY AND STENT PLACEMENT Bilateral 03/01/2020   Procedure: CYSTOSCOPY WITH RETROGRADE PYELOGRAM, URETEROSCOPY AND STENT PLACEMENT;  Surgeon: Sebastian Ache, MD;  Location: [facility];  Service: Urology;  Laterality: Bilateral;  75 MINS   CYSTOSCOPY WITH RETROGRADE PYELOGRAM, URETEROSCOPY AND STENT PLACEMENT Bilateral 05/16/2023   Procedure: CYSTOSCOPY WITH RETROGRADE PYELOGRAM, URETEROSCOPY AND STENT PLACEMENT;  Surgeon:  Loletta Parish., MD;  Location: [facility];  Service: Urology;  Laterality: Bilateral;  90 MINS   CYSTOSCOPY/RETROGRADE/URETEROSCOPY  2020   at [facility] in [place]   CYSTOSCOPY/URETEROSCOPY/HOLMIUM LASER Right 11/24/2017   Procedure: CYSTOSCOPY/URETEROSCOPY/ RETROGRADE/STENT REMOVAL;  Surgeon: Heloise Purpura, MD;  Location: [facility];  Service: Urology;  Laterality: Right;   FEMORAL OSTEOTOMY W/ RODDING  1970   right proximal femUr resection replacement w/ custom prosthesis   FEMUR FRACTURE SURGERY Right 10/17/2021   @ [facility] by dr w. eward;  REVISION RIGHT PROXIMAL FEMUR REPLACEMENT IN SETTING OF DISTAL PERIPROSTHETIC FEMUR FRACTURE, BOTH COMPONENTS   FUNCTIONAL ENDOSCOPIC SINUS SURGERY  03/19/2023   @ [facility]   HOLMIUM LASER APPLICATION Bilateral 03/01/2020   Procedure: HOLMIUM LASER APPLICATION;  Surgeon: Sebastian Ache, MD;  Location: [facility];  Service: Urology;  Laterality: Bilateral;   HOLMIUM LASER APPLICATION Bilateral 05/16/2023   Procedure: HOLMIUM LASER APPLICATION;  Surgeon: Loletta Parish., MD;  Location: [facility];  Service: Urology;  Laterality: Bilateral;   LUMBAR SPINE SURGERY  07/2017   @ [facility]  by dr Newell Coral;    L5-- S1  laminectomy   POSTERIOR LUMBAR FUSION  06/28/2020   @ [facility] [place];   L5--S1   PROSTATECTOMY  03/10/2002   @[facility]  by dr Aldean Ast;    w/ pelvic lymph node dissection's   SPINAL CORD STIMULATOR REMOVAL  04/15/2016   W/   THORACIC PARTIAL LAMINECTOMY T9   Patient Active Problem List   Diagnosis Date Noted   UTI due to Klebsiella species 11/11/2022   Basal cell carcinoma of scalp 09/16/2022   Deposits (accretions) on teeth 09/16/2022   Nontoxic single thyroid nodule 09/16/2022   Thyroid nodule 09/16/2022   Sensorineural hearing loss, bilateral 09/16/2022   Post-COVID syndrome 09/16/2022   Chronic pansinusitis 07/11/2022   Non-seasonal allergic rhinitis 06/11/2022   Abrasion of right  arm 11/19/2021   Femur fracture, right (HCC) 10/17/2021   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HCC) 10/16/2021   History of pulmonary embolism 10/10/2021   Chronic diastolic CHF (congestive heart failure) (HCC) 10/10/2021   CKD (chronic kidney disease), stage III (HCC) 10/10/2021   Hyperlipidemia 10/10/2021   CAD (coronary artery disease) 04/03/2021   Chronic anticoagulation 03/26/2021   Acquired coagulation disorder (HCC) 12/12/2020   Senile purpura (HCC) 12/12/2020   Meibomian gland dysfunction of right eye, unspecified eyelid 07/11/2020   Vitamin B12 deficient megaloblastic anemia 07/11/2020   Aortic atherosclerosis (HCC) 05/17/2020   B12 deficiency 05/17/2020   Iliac artery aneurysm (HCC) 05/17/2020   Osteoporosis 04/04/2020   Idiopathic neuropathy 01/28/2020   Recurrent falls 01/28/2020   GERD (gastroesophageal reflux disease) 12/22/2019   Essential tremor  12/22/2019   Kidney stone 11/05/2017   History of total right hip replacement 03/08/2017   History of prostate cancer 03/08/2017   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   Vertigo 03/01/2017   High risk medication use 02/28/2017   DDD (degenerative disc disease), thoracic 02/28/2017   Ataxia 05/18/2016   Vestibular disequilibrium 05/18/2016   Failed spinal cord stimulator (HCC) 03/20/2016   Lung nodule, solitary 05/24/2014   Peripheral edema 06/08/2012   Backache 04/21/2008   History of peptic ulcer disease 04/21/2008   Essential hypertension 10/29/2007     REFERRING PROVIDER: Shelva Majestic, MD  REFERRING DIAG: R26.89 (ICD-10-CM) - Balance disorder            R53.1 (ICD-10-CM) - Generalized weakness  THERAPY DIAG:  Other lack of coordination  Pain in right hip  Difficulty in walking, not elsewhere classified  Rationale for Evaluation and Treatment: Rehabilitation  ONSET DATE: PT order 04/03/2023 / Fall with Fx and surgery in Oct 2022  SUBJECTIVE:   SUBJECTIVE STATEMENT: Pt reports increased right hip pain this session,  thinks he won't be able to do the nustep this week. Pt report active week but more pain this week. Side stepping for 1.5 hours in pool, thinks that is what causing his pain. Pt reports going to [facility] on Thursday. Took pain medications on Friday.    PERTINENT HISTORY: -Deafness in R ear  -R Hip surgery/proximal femur replacement many years ago with THA revision in the setting of a periprosthetic distal femur fx 10/17/2021  -L5-S1 fusion 2021, OA R knee, neuropathy, Dizziness/vertigo, PE 2021, HTN, and CKD   PAIN:  Are you having pain?  NPRS:  5/10 Location:  R lateral hip   PRECAUTIONS: Fall and Other: R THA revision, Lumbar fusion, neuropathy  WEIGHT BEARING RESTRICTIONS: No  FALLS:  Has patient fallen in last 6 months? Yes. Number of falls 8  LIVING ENVIRONMENT: Lives with: lives with their spouse Lives in: 2 story home Stairs: yes, but pt states he doesn't do stairs. Has following equipment at home: rollator, cane, electric scooter  OCCUPATION: retired  PLOF: Independent  PATIENT GOALS: improve balance and strength.  Return to the level he was in Oct with PT.    OBJECTIVE:   DIAGNOSTIC FINDINGS: Pt had x rays on R hip which are unable to view in Epic.     TODAY'S TREATMENT:                                                                                                                              06/07/2023  -Anterior stepping reactions 3 x 10 steps bilaterally; @ CGA; Cues for R weight shift when stepping w/ LLE. Reduced R weight shift.  -Lateral stepping reactions 2 x 10 steps bilaterally;@ CGAw/  R trunk shift compensation.  -Seated R hip abduction 2 x 30 coupled with lateral stepping reactions -Backwards ambulation 66ft x 4 @ CGA-min assist with tactile and verbal cues for weight shift.  -  4 way walking around mat; no cane; CGA<> min assist; delayed and late anticipatory reactions.  -2x foam pad stepups x 10 -NBOS foam pad standing 2 x 30 seconds CGA <>Min  assist -Standing perturbations x 60 seconds no UE support. Poor ankle strategies with anterior force applied.  Therapeutic Exercise: -Attempted Nustep though pt unable to perform due to pain. -LAQ with 2# 3x10 R LE -Supine bridges with GTB around knees 3x10 -Pt attempted mini squats with bilat UE support.  PT educated pt in correct form though pt unable to demo correct form.  Pt had pain with last rep. -Step ups on 4 inch step x 10 reps with bilat UE support with CGA   Neuro Re-ed activities:  Standing on airex with NBOS with CGA 2x30 sec  Standing in a staggered stance x 30 sec without UE support  Marching on airex with UE support with CGA 2 x 10 reps       Manual Therapy:  Pt is very tender with palpation of R lateral quad and minimal to moderately tender in central quad.   PT used roller on R quad in supine to improve soft tissue tightness and mobility and pain.        PATIENT EDUCATION:  Education details:  dx, exercise form, relevant anatomy, exercise rationale, and POC.  Person educated: Patient Education method: Explanation, demonstration, verbal and tactile cues Education comprehension: verbalized understanding, returned demonstration, verbal and tactile cues required  HOME EXERCISE PROGRAM: Pt has a HEP.   ASSESSMENT:  CLINICAL IMPRESSION: Pt tolerating session well. Heavy balance reaction focus this session. Limited strengthening and mobility due to heavy R hip pain. Continues to show need for CGA with more dynamic movements in part to delayed and poor anticipatory reactions. R hip abductor weakness continues to hinder normal reactions. Reduced ankle strategies, limiting dynamic balance. Pt will continue to benefit from skilled PT services to address functional impairments and improve overall functional QOL.  OBJECTIVE IMPAIRMENTS: Abnormal gait, decreased activity tolerance, decreased balance, decreased endurance, decreased mobility, difficulty walking, decreased  strength, and pain.   ACTIVITY LIMITATIONS: standing, squatting, stairs, transfers, and locomotion level  PARTICIPATION LIMITATIONS: cleaning, shopping, and community activity  PERSONAL FACTORS: 3+ comorbidities: R THA revision, Lumbar fusion, neuropathy, vertigo, R knee OA  are also affecting patient's functional outcome.   REHAB POTENTIAL: Good  CLINICAL DECISION MAKING: Evolving/moderate complexity  EVALUATION COMPLEXITY: Moderate   GOALS:   SHORT TERM GOALS: Target date: 05/30/2023  Pt will be independent and compliant with HEP for improved pain, strength, ROM and function.   Baseline: Goal status: INITIAL  2.  Pt will tolerate aquatic therapy without adverse effects for improved mobility, balance, and strength.   Baseline:  Goal status: INITIAL  3.  Pt will report at least a 50% improvement in R LE giving way/knee buckling and stability with gait/mobility.  Baseline:  Goal status: INITIAL  4.  Pt will have no difficulty with car transfers.   Baseline:  Goal status: INITIAL    LONG TERM GOALS: Target date: 07/11/2023  Pt will deny having any falls.  Baseline:  Goal status: INITIAL  2.  Pt will perform 5x STS test in no > 13 sec for improved functional LE strength and performance of transfers.   Baseline:  Goal status: INITIAL  3.  Pt will be able to perform tandem stance for 20 seconds for improved balance and proprioception with daily mobility.   Baseline:  Goal status: INITIAL  4.  Pt will  ambulate extended community distance with AD with good stability and without significant pain or difficulty.  Baseline:  Goal status: INITIAL  5.  Pt will demo improved R LE strength  to 5/5 in R knee extension, 4+/5 in R DF, and by improved tolerance with resistance with hip flexion and hip abduction for improved performance of functional mobility.  Baseline:  Goal status: INITIAL     PLAN:  PT FREQUENCY: 2x/week  PT DURATION: other: 10 weeks  PLANNED  INTERVENTIONS: Therapeutic exercises, Therapeutic activity, Neuromuscular re-education, Balance training, Gait training, Patient/Family education, Joint mobilization, Stair training, Aquatic Therapy, Electrical stimulation, Cryotherapy, Moist heat, Taping, and Manual therapy   PLAN FOR NEXT SESSION:   Cont with strengthening, balance training, gait training, and manual techniques.   Audie Clear III PT, DPT 06/07/23 11:25 AM  PHYSICAL THERAPY DISCHARGE SUMMARY  Visits from Start of Care: 6  Current functional level related to goals / functional outcomes: Unable to assess due to pt not being present at discharge.     Remaining deficits: Unable to assess due to pt not being present at discharge.     Education / Equipment: See above.    Patient was seen in PT from 05/02/23 to 06/07/23.  Pt cancelled his following 2 appointments due to being out of town.  Pt will be considered discharged from skilled PT due to not scheduling any further PT.     Audie Clear III PT, DPT 02/23/24 8:48 AM

## 2023-06-11 ENCOUNTER — Other Ambulatory Visit: Payer: Self-pay | Admitting: Cardiovascular Disease

## 2023-06-12 ENCOUNTER — Encounter (HOSPITAL_BASED_OUTPATIENT_CLINIC_OR_DEPARTMENT_OTHER): Payer: Medicare Other | Admitting: Physical Therapy

## 2023-06-12 DIAGNOSIS — M25551 Pain in right hip: Secondary | ICD-10-CM | POA: Diagnosis not present

## 2023-06-14 ENCOUNTER — Encounter (HOSPITAL_BASED_OUTPATIENT_CLINIC_OR_DEPARTMENT_OTHER): Payer: Medicare Other

## 2023-06-17 ENCOUNTER — Ambulatory Visit (INDEPENDENT_AMBULATORY_CARE_PROVIDER_SITE_OTHER): Payer: Medicare Other | Admitting: Family Medicine

## 2023-06-17 ENCOUNTER — Encounter: Payer: Self-pay | Admitting: Family Medicine

## 2023-06-17 VITALS — BP 132/72 | HR 54 | Temp 98.5°F | Ht 71.0 in | Wt 246.6 lb

## 2023-06-17 DIAGNOSIS — H532 Diplopia: Secondary | ICD-10-CM

## 2023-06-17 DIAGNOSIS — I10 Essential (primary) hypertension: Secondary | ICD-10-CM | POA: Diagnosis not present

## 2023-06-17 DIAGNOSIS — E538 Deficiency of other specified B group vitamins: Secondary | ICD-10-CM | POA: Diagnosis not present

## 2023-06-17 DIAGNOSIS — Z131 Encounter for screening for diabetes mellitus: Secondary | ICD-10-CM | POA: Diagnosis not present

## 2023-06-17 DIAGNOSIS — E669 Obesity, unspecified: Secondary | ICD-10-CM

## 2023-06-17 LAB — COMPREHENSIVE METABOLIC PANEL
ALT: 17 U/L (ref 0–53)
AST: 19 U/L (ref 0–37)
Albumin: 3.7 g/dL (ref 3.5–5.2)
Alkaline Phosphatase: 60 U/L (ref 39–117)
BUN: 23 mg/dL (ref 6–23)
CO2: 28 mEq/L (ref 19–32)
Calcium: 8.9 mg/dL (ref 8.4–10.5)
Chloride: 106 mEq/L (ref 96–112)
Creatinine, Ser: 1.4 mg/dL (ref 0.40–1.50)
GFR: 48.65 mL/min — ABNORMAL LOW (ref >60.00)
Glucose, Bld: 92 mg/dL (ref 70–99)
Potassium: 4.6 mEq/L (ref 3.5–5.1)
Sodium: 141 mEq/L (ref 135–145)
Total Bilirubin: 1.1 mg/dL (ref 0.2–1.2)
Total Protein: 6.3 g/dL (ref 6.0–8.3)

## 2023-06-17 LAB — VITAMIN B12: Vitamin B-12: 261 pg/mL (ref 211–911)

## 2023-06-17 LAB — TSH: TSH: 1.56 u[IU]/mL (ref 0.35–5.50)

## 2023-06-17 LAB — CBC WITH DIFFERENTIAL/PLATELET
Basophils Absolute: 0 10*3/uL (ref 0.0–0.1)
Basophils Relative: 0.4 % (ref 0.0–3.0)
Eosinophils Absolute: 0.1 10*3/uL (ref 0.0–0.7)
Eosinophils Relative: 1 % (ref 0.0–5.0)
HCT: 43.7 % (ref 39.0–52.0)
Hemoglobin: 14.1 g/dL (ref 13.0–17.0)
Lymphocytes Relative: 17.6 % (ref 12.0–46.0)
Lymphs Abs: 1.4 10*3/uL (ref 0.7–4.0)
MCHC: 32.3 g/dL (ref 30.0–36.0)
MCV: 86.5 fl (ref 78.0–100.0)
Monocytes Absolute: 0.7 10*3/uL (ref 0.1–1.0)
Monocytes Relative: 9.1 % (ref 3.0–12.0)
Neutro Abs: 5.7 10*3/uL (ref 1.4–7.7)
Neutrophils Relative %: 71.9 % (ref 43.0–77.0)
Platelets: 181 10*3/uL (ref 150.0–400.0)
RBC: 5.05 Mil/uL (ref 4.22–5.81)
RDW: 14.4 % (ref 11.5–15.5)
WBC: 7.9 10*3/uL (ref 4.0–10.5)

## 2023-06-17 LAB — HEMOGLOBIN A1C: Hgb A1c MFr Bld: 5.3 % (ref 4.6–6.5)

## 2023-06-17 NOTE — Progress Notes (Signed)
Phone [phone] In person visit   Subjective:   Bruce Ball is a 77 y.o. year old very pleasant male patient who presents for/with See problem oriented charting Chief Complaint  Patient presents with   balance issues    Pt c/o balance issues and double vision (see triage note 06/04) states double vision is remaining about the same. Pt does not have an eye doctor.   Past Medical History-  Patient Active Problem List   Diagnosis Date Noted   Post-COVID syndrome 09/16/2022    Priority: High   History of pulmonary embolism 10/10/2021    Priority: High   Chronic diastolic CHF (congestive heart failure) (HCC) 10/10/2021    Priority: High   CAD (coronary artery disease) 04/03/2021    Priority: High   Aortic atherosclerosis (HCC) 05/17/2020    Priority: High   Idiopathic neuropathy 01/28/2020    Priority: High   Recurrent falls 01/28/2020    Priority: High   History of prostate cancer 03/08/2017    Priority: High   High risk medication use 02/28/2017    Priority: High   Ataxia 05/18/2016    Priority: High   Vestibular disequilibrium 05/18/2016    Priority: High   Deposits (accretions) on teeth 09/16/2022    Priority: Medium    Chronic pansinusitis 07/11/2022    Priority: Medium    CKD (chronic kidney disease), stage III (HCC) 10/10/2021    Priority: Medium    Hyperlipidemia 10/10/2021    Priority: Medium    B12 deficiency 05/17/2020    Priority: Medium    Iliac artery aneurysm (HCC) 05/17/2020    Priority: Medium    Osteoporosis 04/04/2020    Priority: Medium    GERD (gastroesophageal reflux disease) 12/22/2019    Priority: Medium    Essential tremor 12/22/2019    Priority: Medium    Kidney stone 11/05/2017    Priority: Medium    Peripheral edema 06/08/2012    Priority: Medium    Backache 04/21/2008    Priority: Medium    History of peptic ulcer disease 04/21/2008    Priority: Medium    Essential hypertension 10/29/2007    Priority: Medium    UTI due  to Klebsiella species 11/11/2022    Priority: Low   Sensorineural hearing loss, bilateral 09/16/2022    Priority: Low   Chronic anticoagulation 03/26/2021    Priority: Low   Acquired coagulation disorder (HCC) 12/12/2020    Priority: Low   Senile purpura (HCC) 12/12/2020    Priority: Low   History of total right hip replacement 03/08/2017    Priority: Low   DDD (degenerative disc disease), thoracic 02/28/2017    Priority: Low   Lung nodule, solitary 05/24/2014    Priority: Low   Basal cell carcinoma of scalp 09/16/2022   Nontoxic single thyroid nodule 09/16/2022   Thyroid nodule 09/16/2022   Non-seasonal allergic rhinitis 06/11/2022   Abrasion of right arm 11/19/2021   Femur fracture, right (HCC) 10/17/2021   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HCC) 10/16/2021   Meibomian gland dysfunction of right eye, unspecified eyelid 07/11/2020   Vitamin B12 deficient megaloblastic anemia 07/11/2020   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     apixaban (ELIQUIS) 2.5 MG TABS  tablet Take 1 tablet (2.5 mg total) by mouth 2 (two) times daily.     atorvastatin (LIPITOR) 20 MG tablet Take 1 tablet (20 mg total) by mouth at bedtime. (Patient taking differently: Take 20 mg by mouth at bedtime.) 90 tablet 3   carvedilol (COREG) 25 MG tablet Take 25 mg by mouth 2 (two) times daily with a meal.     Cholecalciferol (VITAMIN D3) 50 MCG (2000 UT) capsule Take 2,000 Units by mouth in the morning and at bedtime.      DULoxetine (CYMBALTA) 30 MG capsule Take 30 mg by mouth at bedtime. Patient states he doesn't take this medication     fluticasone (FLONASE) 50 MCG/ACT nasal spray Place 1-2 sprays into both nostrils daily  as needed for allergies or rhinitis.     Multiple Vitamin (MULTIVITAMIN) capsule Take 1 capsule by mouth daily.     mupirocin ointment (BACTROBAN) 2 % Apply 1 Application topically 2 (two) times daily.     pantoprazole (PROTONIX) 40 MG tablet Take 40 mg by mouth 2 (two) times daily before a meal.     potassium citrate (UROCIT-K) 10 MEQ (1080 MG) SR tablet Take 10 mEq by mouth as directed.     senna-docusate (SENOKOT-S) 8.6-50 MG tablet Take 1 tablet by mouth 2 (two) times daily. While taking strong pain meds to prevent constipation 10 tablet 0   Zoledronic Acid (RECLAST IV) Inject into the vein. Yearly for osteoposis     amLODipine (NORVASC) 2.5 MG tablet Take 1 tablet (2.5 mg total) by mouth daily. (Patient not taking: Reported on 06/17/2023) 90 tablet 3   furosemide (LASIX) 20 MG tablet Take 20 mg by mouth daily as needed for fluid (if the feet and/or legs swell). (Patient not taking: Reported on 06/17/2023)     oxyCODONE-acetaminophen (PERCOCET) 5-325 MG tablet Take 1 tablet by mouth every 6 (six) hours as needed for severe pain or moderate pain (post-operatively). (Patient not taking: Reported on 06/17/2023) 15 tablet 0   No current facility-administered medications for this visit.     Objective:  BP 132/72   Pulse (!) 54   Temp 98.5 F (36.9 C)   Ht 5\' 11"  (1.803 m)   Wt 246 lb 9.6 oz (111.9 kg)   SpO2 98%   BMI 34.39 kg/m  Gen: NAD, resting comfortably CV: Slight bradycardic but regular no murmurs rubs or gallops-no obvious atrial fibrillation Lungs: CTAB no crackles, wheeze, rhonchi Abdomen: soft/nontender/nondistended/normal bowel sounds. No rebound or guarding.  Ext: no edema Skin: warm, dry Neuro: CN II-XII intact with exception of double vision produced at upper limits of vertical eye movement and horizontal head movement-resolved with either eye covered, sensation and reflexes normal throughout, 5/5 muscle strength in bilateral upper and lower extremities. Normal finger to  nose. Normal rapid alternating movements. No pronator drift.  Imbalance at baseline so did not test Romberg-unsteady gait noted per baseline uses cane.  Patient reports difficulty with right leg coordination due to prior accident affecting right leg-difficulty with heel-to-shin testing but denies recent worsening    Assessment and Plan   # Double vision/imbalance S:patient called in 06/03/23 complaining of new onset double vision, unsteadiness and dizziness- after kidney stone surgery 2 weeks prior and sinus surgery 6 weeks prior. Emergency Department disposition recommended but he declined.  Recent hip injection not very helpful at [facility] recently. Was told could have double vision a week within surgery but should not have 6 weeks out from surgery.   Today he reports  ongoing double vision that is unchanged. Does not have eye doctor. He denies pain with eye movement. No trauma or injury. Noted suddenly when woke up one day and steady since then. No chest pain or shortness of breath above baseline. No facial or extremity weakness. No slurred words or trouble swallowing. no blurry vision. No increased paresthesias. No confusion or word finding difficulties acutely- has noted some word searches over last 6 months   Working with physical therapy when they have openings for strengthening- work insurance June th and 8th and working at [employer] doing water walking -no alcohol intake. No recent tick exposure.  A/P: 77 year old male with new onset binocular diplopia started 2 weeks ago-strongly prefers outpatient workup-previously declined urgency department visit. - Discussed doing MRI to rule out stroke or mass (stroke should be less likely on Eliquis-interestingly enough he got an alert on his watch within last week about possible A-fib and would consider further investigation if stroke found or if recurrent issues) -Urgent referral to ophthalmology for their  opinion -Will check TSH (has history of thyroid nodules checked at the [place] recently and apparently stable) -Has been persistent and does not fatigue as day goes on-doubt myasthenia gravis - Will check A1c but doubt poorly controlled diabetes as cause -[facility] he reports- no aneurysm -Has had B12 deficiency and is on multivitamin with B12 only for now.  In the past have been on injections for possible pernicious anemia but had a rash on his legs and had to stop this-last B12 was low normal at 295 -Discussed strong possibility of neurology referral in the future  # Hypertension noted-reasonable control and repeat on amlodipine 2.5 mg, carvedilol 25 mg twice daily, Lasix as needed for edema)  Recommended follow up: if new or worsening symptom seek care immediately in emergency department Future Appointments  Date Time Provider Department Center  07/16/2023  9:40 AM Graciella Freer, PA-C [facility]  10/28/2023  2:20 PM Quintella Reichert, MD [facility]  02/17/2024  2:00 PM [facility] ANNUAL WELLNESS VISIT 1 [facility]    Lab/Order associations:   ICD-10-CM   1. Binocular vision disorder with diplopia  H53.2 Ambulatory referral to Ophthalmology    Comprehensive metabolic panel    CBC with Differential/Platelet    TSH    Hemoglobin A1c    MR BRAIN W WO CONTRAST    2. Essential hypertension  I10 Comprehensive metabolic panel    CBC with Differential/Platelet    TSH    3. Screening for diabetes mellitus  Z13.1 Hemoglobin A1c    4. Obesity (BMI 30-39.9)  E66.9 Hemoglobin A1c    5. B12 deficiency  E53.8 Vitamin B12      Time Spent: 42 minutes of total time (11:38 AM-12:20 PM) was spent on the date of the encounter performing the following actions: chart review prior to seeing the patient, obtaining history, performing a medically necessary exam, counseling on the appropriate workup that is needed and potential further workup  and/or treatment plan depending on findings such as stroke or mass, placing orders, and documenting in our EHR.    Return precautions advised.  Tana Conch, MD

## 2023-06-17 NOTE — Patient Instructions (Addendum)
Team will reach out about urgent referral to ophthalmology  -team I know for outpatient I cannot order MRI stat but please have referral team prioritize this still as trying to get in ASAP  Ordered MRI through [facility].  Their phone number is [phone].  Please call them if you have not heard in 2 days.   Please stop by lab before you go If you have mychart- we will send your results within 3 business days of [name] receiving them.  If you do not have mychart- we will call you about results within 5 business days of [name] receiving them.  *please also note that you will see labs on mychart as soon as they post. I will later go in and write notes on them- will say "notes from Dr. Durene Cal"   Recommended follow up: if new or worsening symptom seek care immediately

## 2023-06-20 ENCOUNTER — Ambulatory Visit
Admission: RE | Admit: 2023-06-20 | Discharge: 2023-06-20 | Disposition: A | Discharge status: Home / Self Care | Payer: Medicare Other | Source: Ambulatory Visit | Attending: Family Medicine | Admitting: Family Medicine

## 2023-06-20 DIAGNOSIS — H532 Diplopia: Secondary | ICD-10-CM

## 2023-06-20 MED ORDER — GADOPICLENOL 0.5 MMOL/ML IV SOLN
10.0000 mL | Freq: Once | INTRAVENOUS | Status: AC | PRN
  Administered 2023-06-20: 10 mL via INTRAVENOUS

## 2023-06-23 ENCOUNTER — Ambulatory Visit: Payer: Medicare Other | Admitting: Student

## 2023-06-24 DIAGNOSIS — Z961 Presence of intraocular lens: Secondary | ICD-10-CM | POA: Diagnosis not present

## 2023-06-24 DIAGNOSIS — H532 Diplopia: Secondary | ICD-10-CM | POA: Diagnosis not present

## 2023-06-24 DIAGNOSIS — H04123 Dry eye syndrome of bilateral lacrimal glands: Secondary | ICD-10-CM | POA: Diagnosis not present

## 2023-07-15 DIAGNOSIS — Z23 Encounter for immunization: Secondary | ICD-10-CM | POA: Diagnosis not present

## 2023-07-15 NOTE — Progress Notes (Unsigned)
  Electrophysiology Office Note:   Date:  07/16/2023  ID:  Bruce Ball, DOB [birth year], MRN [id number]  Primary Cardiologist: Armanda Magic, MD Electrophysiologist: Maurice Small, MD      History of Present Illness:   Bruce Ball is a 77 y.o. male with h/o CKD (IIIa), ankylosing spondylitis, arthritis, essential tremor, GERD, HTN, PE (2021 w/R heart strain), chronic CHF (HFpEF)  seen today for routine electrophysiology followup.   Since last being seen in our clinic the patient reports doing very well.  he denies chest pain, palpitations, dyspnea, PND, orthopnea, nausea, vomiting, dizziness, syncope, edema, weight gain, or early satiety.   Review of systems complete and found to be negative unless listed in HPI.   EP Information / Studies Reviewed:    EKG is not ordered today. EKG from 04/21/2023 reviewed which showed NSR at 69 bpm, PACs       Monitor 03/2023 HR Min/Max 43 bpm - 160 bpm, AVG 58 bpm.  Predominant underlying rhythm was Sinus Rhythm. Slight P wave morphology changes were noted. 232 Supraventricular Tachycardia runs occurred, the run with the fastest interval  lasting 10 beats with a max rate of 160 bpm, the longest lasting 13.5 secs with an avg rate of 102 bpm. Some episodes of Supraventricular Tachycardia may be possible Atrial Tachycardia with variable block. Isolated SVEs were frequent (5.6%, 34198), SVE  Couplets were frequent (11.5%, 35390), and SVE Triplets were occasional (4.7%, 9659). Isolated VEs were rare (<1.0%), VE Couplets were rare (<1.0%), and no VE Triplets were present.  Risk Assessment/Calculations:              Physical Exam:   VS:  BP 110/64   Pulse 68   Ht 5\' 11"  (1.803 m)   Wt 248 lb 6.4 oz (112.7 kg)   SpO2 98%   BMI 34.64 kg/m    Wt Readings from Last 3 Encounters:  07/16/23 248 lb 6.4 oz (112.7 kg)  06/17/23 246 lb 9.6 oz (111.9 kg)  05/16/23 243 lb (110.2 kg)     GEN: Well nourished, well developed in no acute distress NECK:  No JVD; No carotid bruits CARDIAC: Regular rate and rhythm, no murmurs, rubs, gallops RESPIRATORY:  Clear to auscultation without rales, wheezing or rhonchi  ABDOMEN: Soft, non-tender, non-distended EXTREMITIES:  No edema; No deformity   ASSESSMENT AND PLAN:    PVCs Low burden on coreg 25 mg BID No symptoms of bradycardia He will net [name] know of any recurrent alerts by his watch  PACs Brief, possible AT. Frequent Isolated PACs on monitor.  Asymptomatic Follow on coreg  HTN Stable on current regimen   h/o PE Continue eliquis    Follow up with Dr. Nelly Laurence in 6 months  Signed, Graciella Freer, PA-C

## 2023-07-16 ENCOUNTER — Encounter: Payer: Self-pay | Admitting: Student

## 2023-07-16 ENCOUNTER — Ambulatory Visit: Payer: Medicare Other | Attending: Student | Admitting: Student

## 2023-07-16 VITALS — BP 110/64 | HR 68 | Ht 71.0 in | Wt 248.4 lb

## 2023-07-16 DIAGNOSIS — I491 Atrial premature depolarization: Secondary | ICD-10-CM | POA: Diagnosis not present

## 2023-07-16 DIAGNOSIS — I493 Ventricular premature depolarization: Secondary | ICD-10-CM

## 2023-07-16 DIAGNOSIS — I10 Essential (primary) hypertension: Secondary | ICD-10-CM

## 2023-07-16 NOTE — Patient Instructions (Signed)
Medication Instructions:  Your physician recommends that you continue on your current medications as directed. Please refer to the Current Medication list given to you today.  *If you need a refill on your cardiac medications before your next appointment, please call your pharmacy*  Lab Work: None ordered If you have labs (blood work) drawn today and your tests are completely normal, you will receive your results only by: MyChart Message (if you have MyChart) OR A paper copy in the mail If you have any lab test that is abnormal or we need to change your treatment, we will call you to review the results.  Follow-Up: At [facility], you and your health needs are our priority.  As part of our continuing mission to provide you with exceptional heart care, we have created designated Provider Care Teams.  These Care Teams include your primary Cardiologist (physician) and Advanced Practice Providers (APPs -  Physician Assistants and Nurse Practitioners) who all work together to provide you with the care you need, when you need it.  Your next appointment:   6 month(s)  Provider:   You may see Maurice Small, MD or one of the following Advanced Practice Providers on your designated Care Team:   Francis Dowse, [place] [address] [place], [place] Canary Brim, NP

## 2023-07-29 DIAGNOSIS — C419 Malignant neoplasm of bone and articular cartilage, unspecified: Secondary | ICD-10-CM | POA: Diagnosis not present

## 2023-07-29 DIAGNOSIS — G8929 Other chronic pain: Secondary | ICD-10-CM | POA: Diagnosis not present

## 2023-07-29 DIAGNOSIS — M1711 Unilateral primary osteoarthritis, right knee: Secondary | ICD-10-CM | POA: Diagnosis not present

## 2023-07-29 DIAGNOSIS — Z96649 Presence of unspecified artificial hip joint: Secondary | ICD-10-CM | POA: Diagnosis not present

## 2023-07-29 DIAGNOSIS — M978XXA Periprosthetic fracture around other internal prosthetic joint, initial encounter: Secondary | ICD-10-CM | POA: Diagnosis not present

## 2023-07-29 DIAGNOSIS — Z96641 Presence of right artificial hip joint: Secondary | ICD-10-CM | POA: Diagnosis not present

## 2023-07-29 DIAGNOSIS — M25551 Pain in right hip: Secondary | ICD-10-CM | POA: Diagnosis not present

## 2023-07-31 DIAGNOSIS — H04123 Dry eye syndrome of bilateral lacrimal glands: Secondary | ICD-10-CM | POA: Diagnosis not present

## 2023-07-31 DIAGNOSIS — H532 Diplopia: Secondary | ICD-10-CM | POA: Diagnosis not present

## 2023-09-02 ENCOUNTER — Ambulatory Visit (INDEPENDENT_AMBULATORY_CARE_PROVIDER_SITE_OTHER): Payer: Medicare Other | Admitting: Family Medicine

## 2023-09-02 ENCOUNTER — Encounter: Payer: Self-pay | Admitting: Family Medicine

## 2023-09-02 VITALS — BP 128/82 | HR 83 | Temp 98.0°F | Ht 71.0 in | Wt 248.8 lb

## 2023-09-02 DIAGNOSIS — R6889 Other general symptoms and signs: Secondary | ICD-10-CM | POA: Diagnosis not present

## 2023-09-02 DIAGNOSIS — J0110 Acute frontal sinusitis, unspecified: Secondary | ICD-10-CM

## 2023-09-02 DIAGNOSIS — H6993 Unspecified Eustachian tube disorder, bilateral: Secondary | ICD-10-CM

## 2023-09-02 DIAGNOSIS — Z1152 Encounter for screening for COVID-19: Secondary | ICD-10-CM

## 2023-09-02 DIAGNOSIS — J4520 Mild intermittent asthma, uncomplicated: Secondary | ICD-10-CM | POA: Diagnosis not present

## 2023-09-02 LAB — POC COVID19 BINAXNOW: SARS Coronavirus 2 Ag: NEGATIVE

## 2023-09-02 MED ORDER — AMOXICILLIN 875 MG PO TABS
875.0000 mg | ORAL_TABLET | Freq: Two times a day (BID) | ORAL | 0 refills | Status: AC
Start: 2023-09-02 — End: 2023-09-12

## 2023-09-02 MED ORDER — PREDNISONE 10 MG PO TABS
10.0000 mg | ORAL_TABLET | Freq: Two times a day (BID) | ORAL | 0 refills | Status: AC
Start: 2023-09-02 — End: 2023-09-09

## 2023-09-02 NOTE — Progress Notes (Signed)
Established Patient Office Visit   Subjective:  Patient ID: Bruce Ball, male    DOB: 1946/05/13  Age: 77 y.o. MRN: [id number]  Chief Complaint  Patient presents with   Nasal Congestion    Nasal and chest congestion. Productive cough, headaches, pressure, fatigue. No body aches, NV, fever or chills.     HPI Encounter Diagnoses  Name Primary?   Encounter for screening for COVID-19 Yes   Flu-like symptoms    Acute non-recurrent frontal sinusitis    Dysfunction of both eustachian tubes    Mild intermittent reactive airway disease without complication    10-day history nasal congestion with purulent rhinorrhea and postnasal drip, facial pressure and ear congestion.  He feels pressure in the frontal area.  Mild cough.  There is some tightness and wheezing in the chest.  No history of asthma or COPD.  Quit smoking in 1980.  No fevers or chills.  History of sinus surgery for nasal polyposis.  Not using Flonase regularly.  He has tried regular Mucinex and nasal saline.   Review of Systems  Constitutional: Negative.  Negative for chills and fever.  HENT:  Positive for congestion and sinus pain.   Eyes:  Negative for blurred vision, discharge and redness.  Respiratory:  Positive for cough and wheezing. Negative for shortness of breath.   Cardiovascular: Negative.   Gastrointestinal:  Negative for abdominal pain, nausea and vomiting.  Genitourinary: Negative.   Musculoskeletal: Negative.  Negative for joint pain and myalgias.  Skin:  Negative for rash.  Neurological:  Negative for tingling, loss of consciousness and weakness.  Endo/Heme/Allergies:  Negative for polydipsia.     Current Outpatient Medications:    acetaminophen (TYLENOL) 500 MG tablet, Take 1,000 mg by mouth every 6 (six) hours as needed for headache, fever, moderate pain or mild pain., Disp: , Rfl:    amoxicillin (AMOXIL) 875 MG tablet, Take 1 tablet (875 mg total) by mouth 2 (two) times daily for 10 days., Disp: 20  tablet, Rfl: 0   apixaban (ELIQUIS) 2.5 MG TABS tablet, Take 1 tablet (2.5 mg total) by mouth 2 (two) times daily., Disp: , Rfl:    atorvastatin (LIPITOR) 20 MG tablet, Take 1 tablet (20 mg total) by mouth at bedtime. (Patient taking differently: Take 20 mg by mouth at bedtime.), Disp: 90 tablet, Rfl: 3   carvedilol (COREG) 25 MG tablet, Take 25 mg by mouth 2 (two) times daily with a meal., Disp: , Rfl:    Cholecalciferol (VITAMIN D3) 50 MCG (2000 UT) capsule, Take 2,000 Units by mouth in the morning and at bedtime. , Disp: , Rfl:    fluticasone (FLONASE) 50 MCG/ACT nasal spray, Place 1-2 sprays into both nostrils daily as needed for allergies or rhinitis., Disp: , Rfl:    furosemide (LASIX) 20 MG tablet, Take 20 mg by mouth daily as needed for fluid (if the feet and/or legs swell)., Disp: , Rfl:    Multiple Vitamin (MULTIVITAMIN) capsule, Take 1 capsule by mouth daily., Disp: , Rfl:    pantoprazole (PROTONIX) 40 MG tablet, Take 40 mg by mouth 2 (two) times daily before a meal., Disp: , Rfl:    potassium citrate (UROCIT-K) 10 MEQ (1080 MG) SR tablet, Take 10 mEq by mouth as directed., Disp: , Rfl:    predniSONE (DELTASONE) 10 MG tablet, Take 1 tablet (10 mg total) by mouth 2 (two) times daily with a meal for 7 days., Disp: 14 tablet, Rfl: 0   amLODipine (NORVASC) 2.5 MG  tablet, Take 1 tablet (2.5 mg total) by mouth daily. (Patient not taking: Reported on 06/17/2023), Disp: 90 tablet, Rfl: 3   DULoxetine (CYMBALTA) 30 MG capsule, Take 30 mg by mouth at bedtime. Patient states he doesn't take this medication (Patient not taking: Reported on 09/02/2023), Disp: , Rfl:    mupirocin ointment (BACTROBAN) 2 %, Apply 1 Application topically 2 (two) times daily., Disp: , Rfl:    oxyCODONE-acetaminophen (PERCOCET) 5-325 MG tablet, Take 1 tablet by mouth every 6 (six) hours as needed for severe pain or moderate pain (post-operatively). (Patient not taking: Reported on 06/17/2023), Disp: 15 tablet, Rfl: 0    senna-docusate (SENOKOT-S) 8.6-50 MG tablet, Take 1 tablet by mouth 2 (two) times daily. While taking strong pain meds to prevent constipation (Patient not taking: Reported on 09/02/2023), Disp: 10 tablet, Rfl: 0   Zoledronic Acid (RECLAST IV), Inject into the vein. Yearly for osteoposis (Patient not taking: Reported on 07/16/2023), Disp: , Rfl:    Objective:     BP 128/82   Pulse 83   Temp 98 F (36.7 C)   Ht 5\' 11"  (1.803 m)   Wt 248 lb 12.8 oz (112.9 kg)   SpO2 96%   BMI 34.70 kg/m    Physical Exam Constitutional:      General: He is not in acute distress.    Appearance: Normal appearance. He is not ill-appearing, toxic-appearing or diaphoretic.  HENT:     Head: Normocephalic and atraumatic.     Right Ear: External ear normal. No middle ear effusion. Tympanic membrane is retracted. Tympanic membrane is not erythematous.     Left Ear: External ear normal.  No middle ear effusion. Tympanic membrane is retracted. Tympanic membrane is not erythematous.     Mouth/Throat:     Mouth: Mucous membranes are moist.     Pharynx: Oropharynx is clear. No oropharyngeal exudate or posterior oropharyngeal erythema.  Eyes:     General: No scleral icterus.       Right eye: No discharge.        Left eye: No discharge.     Extraocular Movements: Extraocular movements intact.     Conjunctiva/sclera: Conjunctivae normal.     Pupils: Pupils are equal, round, and reactive to light.  Cardiovascular:     Rate and Rhythm: Normal rate and regular rhythm.  Pulmonary:     Effort: Pulmonary effort is normal. No respiratory distress.     Breath sounds: Normal breath sounds. No wheezing, rhonchi or rales.  Abdominal:     General: Bowel sounds are normal.     Tenderness: There is no abdominal tenderness. There is no guarding.  Musculoskeletal:     Cervical back: No rigidity or tenderness.  Skin:    General: Skin is warm and dry.  Neurological:     Mental Status: He is alert and oriented to person,  place, and time.  Psychiatric:        Mood and Affect: Mood normal.        Behavior: Behavior normal.      Results for orders placed or performed in visit on 09/02/23  POC COVID-19 BinaxNow  Result Value Ref Range   SARS Coronavirus 2 Ag Negative Negative      The ASCVD Risk score (Arnett DK, et al., 2019) failed to calculate for the following reasons:   The valid total cholesterol range is 130 to 320 mg/dL    Assessment & Plan:   Encounter for screening for COVID-19 -  POC COVID-19 BinaxNow  Flu-like symptoms  Acute non-recurrent frontal sinusitis -     Amoxicillin; Take 1 tablet (875 mg total) by mouth 2 (two) times daily for 10 days.  Dispense: 20 tablet; Refill: 0  Dysfunction of both eustachian tubes -     predniSONE; Take 1 tablet (10 mg total) by mouth 2 (two) times daily with a meal for 7 days.  Dispense: 14 tablet; Refill: 0  Mild intermittent reactive airway disease without complication -     predniSONE; Take 1 tablet (10 mg total) by mouth 2 (two) times daily with a meal for 7 days.  Dispense: 14 tablet; Refill: 0    Return Use mucinex dm..  Return if not improving in a week or so.  Mliss Sax, MD

## 2023-10-13 DIAGNOSIS — N1832 Chronic kidney disease, stage 3b: Secondary | ICD-10-CM | POA: Diagnosis not present

## 2023-10-22 DIAGNOSIS — G629 Polyneuropathy, unspecified: Secondary | ICD-10-CM | POA: Diagnosis not present

## 2023-10-22 DIAGNOSIS — I129 Hypertensive chronic kidney disease with stage 1 through stage 4 chronic kidney disease, or unspecified chronic kidney disease: Secondary | ICD-10-CM | POA: Diagnosis not present

## 2023-10-22 DIAGNOSIS — Z87442 Personal history of urinary calculi: Secondary | ICD-10-CM | POA: Diagnosis not present

## 2023-10-22 DIAGNOSIS — D631 Anemia in chronic kidney disease: Secondary | ICD-10-CM | POA: Diagnosis not present

## 2023-10-22 DIAGNOSIS — N1832 Chronic kidney disease, stage 3b: Secondary | ICD-10-CM | POA: Diagnosis not present

## 2023-10-22 DIAGNOSIS — I2699 Other pulmonary embolism without acute cor pulmonale: Secondary | ICD-10-CM | POA: Diagnosis not present

## 2023-10-23 ENCOUNTER — Other Ambulatory Visit (HOSPITAL_COMMUNITY): Payer: Self-pay

## 2023-10-23 ENCOUNTER — Other Ambulatory Visit: Payer: Self-pay

## 2023-10-23 ENCOUNTER — Other Ambulatory Visit: Payer: Self-pay | Admitting: Family Medicine

## 2023-10-23 MED ORDER — POTASSIUM CITRATE ER 10 MEQ (1080 MG) PO TBCR
10.0000 meq | EXTENDED_RELEASE_TABLET | ORAL | 1 refills | Status: AC
  Filled 2023-10-23: qty 90, 90d supply, fill #0

## 2023-10-28 ENCOUNTER — Ambulatory Visit: Payer: Medicare Other | Admitting: Cardiology

## 2023-10-28 ENCOUNTER — Ambulatory Visit: Payer: Medicare Other | Attending: Cardiology | Admitting: Physician Assistant

## 2023-10-28 ENCOUNTER — Encounter: Payer: Self-pay | Admitting: Physician Assistant

## 2023-10-28 VITALS — BP 134/68 | HR 70 | Ht 71.0 in | Wt 251.8 lb

## 2023-10-28 DIAGNOSIS — R0609 Other forms of dyspnea: Secondary | ICD-10-CM | POA: Diagnosis not present

## 2023-10-28 DIAGNOSIS — I2510 Atherosclerotic heart disease of native coronary artery without angina pectoris: Secondary | ICD-10-CM | POA: Insufficient documentation

## 2023-10-28 DIAGNOSIS — I10 Essential (primary) hypertension: Secondary | ICD-10-CM | POA: Diagnosis not present

## 2023-10-28 DIAGNOSIS — I5032 Chronic diastolic (congestive) heart failure: Secondary | ICD-10-CM | POA: Diagnosis not present

## 2023-10-28 NOTE — Progress Notes (Signed)
Cardiology Office Note:  .   Date:  10/28/2023  ID:  Bruce Ball, DOB 10/28/1946, MRN [id number] PCP: Shelva Majestic, MD  [facility] Providers Cardiologist:  Armanda Magic, MD Electrophysiologist:  Maurice Small, MD {    History of Present Illness: Bruce Ball is a 77 y.o. male with a past medical history of HTN, PE 12/2019 with evidence of right heart strain on chronic anticoagulation with Eliquis, CKD stage III, coronary artery calcification, aortic atherosclerosis, ankylosing spondylosis, arthritis, essential tremor, GERD, kidney stones, peptic ulcer, thyroid nodule, chronic HFpEF, mild dilation of the ascending aorta (normal by echo 08/2022) seen for follow-up appointment.  He establish care during admission 02/2021 with lower extremity edema after a 2400 mile road trip to [place] and eating fast food.  CTA showed no recurrent PE but did have coronary calcifications.  Echo during that time showed hyperdynamic EF greater than 75% with intracavity gradient, grade 1 DD, normal RV, normal PASP, mild aortic sclerosis without stenosis, mild dilation of ascending aorta.  BP was elevated.  Treated with Lasix and diuresed 3.2 L about 16 pounds but creatinine bumped up so meds were adjusted with the plan nephrology put forth.  Calcium score 05/2021 was 152 (he 43rd percentile) with extensive aortic arch and descending aortic atherosclerosis and aortic valve leaflet calcifications, continue on statin therapy.  Patient was last seen 10/30/2022 and reported long-term anticoagulation.  This is not managed through our office.  Was seen 9/23 at the request of PCP for dyspnea.  Reported persistent dizziness, DOE, worse memory, weight loss, malaise, chest congestion and persistent cough.  Symptoms were felt post-COVID in nature and diagnosed with COVID-19 08/26/2022 and hospitalized for 3 to 4 days after recurrent falls in [place] and treated with antivirals.  BMP was normal.  Subsequently  treated with doxycycline.  2D echo 9/23 were reassuring with LVEF 65 to 70%, normal RV, mild LAE, normal sized aorta.  Nuclear stress 10/23 was normal and symptoms were felt to be noncardiac.  When he was last seen 10/30/2022 overall feeling well.  Dyspnea had improved.  Graduated from PT and was participating in a swim class.  No recent angina.  Blood pressure slightly elevated on arrival but when rechecked it was 132/70.  Today, he presents for a routine follow-up. Over the past year, the patient has had no chest pain or shortness of breath. However, he experienced an episode of low heart rate, as indicated by his smartwatch. This led to a change in his medication, but the new medication worsened his condition, so he returned to his previous medication, Carvedilol 25mg  twice daily. The patient also takes Amlodipine 2.5mg  and Lasix 20mg  as needed. Recently, due to swelling in his legs, he has been taking Lasix daily along with potassium supplements. The patient had a full workup with [facility] a week ago, but the results are not available at the time of the visit. He will give [name] a call with his results.    Reports no shortness of breath nor dyspnea on exertion. Reports no chest pain, pressure, or tightness. No orthopnea, PND. Reports no palpitations.   ROS: Pertinent ROS on HPI   Studies Reviewed: Marland Kitchen        Steffanie Dunn 10/01/2022   The study is normal. The study is low risk.   No ST deviation was noted.   Left ventricular function is normal. Nuclear stress EF: 60 %. The left ventricular ejection fraction is normal (55-65%).  End diastolic cavity size is normal. End systolic cavity size is normal.   Prior study not available for comparison.   Normal resting and stress perfusion. No ischemia or infarction EF 60%      Physical Exam:   VS:  BP 134/68   Pulse 70   Ht 5\' 11"  (1.803 m)   Wt 251 lb 12.8 oz (114.2 kg)   SpO2 95%   BMI 35.12 kg/m    Wt Readings from Last 3 Encounters:   10/28/23 251 lb 12.8 oz (114.2 kg)  09/02/23 248 lb 12.8 oz (112.9 kg)  07/16/23 248 lb 6.4 oz (112.7 kg)    GEN: Well nourished, well developed in no acute distress NECK: No JVD; No carotid bruits CARDIAC: RRR, no murmurs, rubs, gallops RESPIRATORY:  Clear to auscultation without rales, wheezing or rhonchi  ABDOMEN: Soft, non-tender, non-distended EXTREMITIES:  + pedal edema; No deformity   ASSESSMENT AND PLAN: .    CAD -Continue current medications -No chest pain  Congestive Heart Failure/HFpEF/DOE No reported chest pain or shortness of breath. Noted lower extremity edema. Currently on Carvedilol 25mg  BID, Amlodipine 2.5mg  as needed, and Lasix 20mg  daily with potassium supplementation. Recent concern for low heart rate, but no changes made to medication regimen. -Continue current medication regimen. -Advise patient to monitor for symptoms of worsening heart failure, such as increased shortness of breath or swelling.  Hypertension Managed with Carvedilol and Amlodipine as needed. -Continue current medication regimen.  Renal Function Recent labs drawn at [facility], but results not available during visit. Patient is on Lasix and potassium supplementation. -Advise patient to share recent lab results when available. -Continue monitoring renal function, especially given use of Lasix.  Follow-up -Advise patient to contact office with recent kidney function labs when available.       Dispo: He can follow-up in a year with Dr. Mayford Knife  Signed, Sharlene Dory, PA-C

## 2023-10-28 NOTE — Patient Instructions (Addendum)
Medication Instructions:  Your physician recommends that you continue on your current medications as directed. Please refer to the Current Medication list given to you today.  *If you need a refill on your cardiac medications before your next appointment, please call your pharmacy*  Lab Work: None ordered If you have labs (blood work) drawn today and your tests are completely normal, you will receive your results only by: MyChart Message (if you have MyChart) OR A paper copy in the mail If you have any lab test that is abnormal or we need to change your treatment, we will call you to review the results.  Follow-Up: At [facility], you and your health needs are our priority.  As part of our continuing mission to provide you with exceptional heart care, we have created designated Provider Care Teams.  These Care Teams include your primary Cardiologist (physician) and Advanced Practice Providers (APPs -  Physician Assistants and Nurse Practitioners) who all work together to provide you with the care you need, when you need it.   Your next appointment:   1 year(s)  Provider:   Armanda Magic, MD     Other Instructions 1.Weigh every morning after using the restroom, before breakfast and call and let us know if you have a weight gain of 2 lbs or more overnight or 5 lbs or more in a week.   2.Elevate your feet when you have swelling and wear lower extremity compression.  Low-Sodium Eating Plan Salt (sodium) helps you keep a healthy balance of fluids in your body. Too much sodium can raise your blood pressure. It can also cause fluid and waste to be held in your body. Your health care provider or dietitian may recommend a low-sodium eating plan if you have high blood pressure (hypertension), kidney disease, liver disease, or heart failure. Eating less sodium can help lower your blood pressure and reduce swelling. It can also protect your heart, liver, and kidneys. What are tips for  following this plan? Reading food labels  Check food labels for the amount of sodium per serving. If you eat more than one serving, you must multiply the listed amount by the number of servings. Choose foods with less than 140 milligrams (mg) of sodium per serving. Avoid foods with 300 mg of sodium or more per serving. Always check how much sodium is in a product, even if the label says "unsalted" or "no salt added." Shopping  Buy products labeled as "low-sodium" or "no salt added." Buy fresh foods. Avoid canned foods and pre-made or frozen meals. Avoid canned, cured, or processed meats. Buy breads that have less than 80 mg of sodium per slice. Cooking  Eat more home-cooked food. Try to eat less restaurant, buffet, and fast food. Try not to add salt when you cook. Use salt-free seasonings or herbs instead of table salt or sea salt. Check with your provider or pharmacist before using salt substitutes. Cook with plant-based oils, such as canola, sunflower, or olive oil. Meal planning When eating at a restaurant, ask if your food can be made with less salt or no salt. Avoid dishes labeled as brined, pickled, cured, or smoked. Avoid dishes made with soy sauce, miso, or teriyaki sauce. Avoid foods that have monosodium glutamate (MSG) in them. MSG may be added to some restaurant food, sauces, soups, bouillon, and canned foods. Make meals that can be grilled, baked, poached, roasted, or steamed. These are often made with less sodium. General information Try to limit your sodium  intake to 1,500-2,300 mg each day, or the amount told by your provider. What foods should I eat? Fruits Fresh, frozen, or canned fruit. Fruit juice. Vegetables Fresh or frozen vegetables. "No salt added" canned vegetables. "No salt added" tomato sauce and paste. Low-sodium or reduced-sodium tomato and vegetable juice. Grains Low-sodium cereals, such as oats, puffed wheat and rice, and shredded wheat. Low-sodium  crackers. Unsalted rice. Unsalted pasta. Low-sodium bread. Whole grain breads and whole grain pasta. Meats and other proteins Fresh or frozen meat, poultry, seafood, and fish. These should have no added salt. Low-sodium canned tuna and salmon. Unsalted nuts. Dried peas, beans, and lentils without added salt. Unsalted canned beans. Eggs. Unsalted nut butters. Dairy Milk. Soy milk. Cheese that is naturally low in sodium, such as ricotta cheese, fresh mozzarella, or Swiss cheese. Low-sodium or reduced-sodium cheese. Cream cheese. Yogurt. Seasonings and condiments Fresh and dried herbs and spices. Salt-free seasonings. Low-sodium mustard and ketchup. Sodium-free salad dressing. Sodium-free light mayonnaise. Fresh or refrigerated horseradish. Lemon juice. Vinegar. Other foods Homemade, reduced-sodium, or low-sodium soups. Unsalted popcorn and pretzels. Low-salt or salt-free chips. The items listed above may not be all the foods and drinks you can have. Talk to a dietitian to learn more. What foods should I avoid? Vegetables Sauerkraut, pickled vegetables, and relishes. Olives. [place] fries. Onion rings. Regular canned vegetables, except low-sodium or reduced-sodium items. Regular canned tomato sauce and paste. Regular tomato and vegetable juice. Frozen vegetables in sauces. Grains Instant hot cereals. Bread stuffing, pancake, and biscuit mixes. Croutons. Seasoned rice or pasta mixes. Noodle soup cups. Boxed or frozen macaroni and cheese. Regular salted crackers. Self-rising flour. Meats and other proteins Meat or fish that is salted, canned, smoked, spiced, or pickled. Precooked or cured meat, such as sausages or meat loaves. Tomasa Blase. Ham. Pepperoni. Hot dogs. Corned beef. Chipped beef. Salt pork. Jerky. Pickled herring, anchovies, and sardines. Regular canned tuna. Salted nuts. Dairy Processed cheese and cheese spreads. Hard cheeses. Cheese curds. Blue cheese. Feta cheese. String cheese. Regular cottage  cheese. Buttermilk. Canned milk. Fats and oils Salted butter. Regular margarine. Ghee. Bacon fat. Seasonings and condiments Onion salt, garlic salt, seasoned salt, table salt, and sea salt. Canned and packaged gravies. Worcestershire sauce. Tartar sauce. Barbecue sauce. Teriyaki sauce. Soy sauce, including reduced-sodium soy sauce. Steak sauce. Fish sauce. Oyster sauce. Cocktail sauce. Horseradish that you find on the shelf. Regular ketchup and mustard. Meat flavorings and tenderizers. Bouillon cubes. Hot sauce. Pre-made or packaged marinades. Pre-made or packaged taco seasonings. Relishes. Regular salad dressings. Salsa. Other foods Salted popcorn and pretzels. Corn chips and puffs. Potato and tortilla chips. Canned or dried soups. Pizza. Frozen entrees and pot pies. The items listed above may not be all the foods and drinks you should avoid. Talk to a dietitian to learn more. This information is not intended to replace advice given to you by your health care provider. Make sure you discuss any questions you have with your health care provider. Document Revised: 01/02/2023 Document Reviewed: 01/02/2023 Elsevier Patient Education  2024 [employer] Heart-Healthy Eating Plan Many factors influence your heart health, including eating and exercise habits. Heart health is also called coronary health. Coronary risk increases with abnormal blood fat (lipid) levels. A heart-healthy eating plan includes limiting unhealthy fats, increasing healthy fats, limiting salt (sodium) intake, and making other diet and lifestyle changes. What is my plan? Your health care provider may recommend that: You limit your fat intake to _________% or less of your total calories each day. You  limit your saturated fat intake to _________% or less of your total calories each day. You limit the amount of cholesterol in your diet to less than _________ mg per day. You limit the amount of sodium in your diet to less than  _________ mg per day. What are tips for following this plan? Cooking Cook foods using methods other than frying. Baking, boiling, grilling, and broiling are all good options. Other ways to reduce fat include: Removing the skin from poultry. Removing all visible fats from meats. Steaming vegetables in water or broth. Meal planning  At meals, imagine dividing your plate into fourths: Fill one-half of your plate with vegetables and green salads. Fill one-fourth of your plate with whole grains. Fill one-fourth of your plate with lean protein foods. Eat 2-4 cups of vegetables per day. One cup of vegetables equals 1 cup (91 g) broccoli or cauliflower florets, 2 medium carrots, 1 large bell pepper, 1 large sweet potato, 1 large tomato, 1 medium white potato, 2 cups (150 g) raw leafy greens. Eat 1-2 cups of fruit per day. One cup of fruit equals 1 small apple, 1 large banana, 1 cup (237 g) mixed fruit, 1 large orange,  cup (82 g) dried fruit, 1 cup (240 mL) 100% fruit juice. Eat more foods that contain soluble fiber. Examples include apples, broccoli, carrots, beans, peas, and barley. Aim to get 25-30 g of fiber per day. Increase your consumption of legumes, nuts, and seeds to 4-5 servings per week. One serving of dried beans or legumes equals  cup (90 g) cooked, 1 serving of nuts is  oz (12 almonds, 24 pistachios, or 7 walnut halves), and 1 serving of seeds equals  oz (8 g). Fats Choose healthy fats more often. Choose monounsaturated and polyunsaturated fats, such as olive and canola oils, avocado oil, flaxseeds, walnuts, almonds, and seeds. Eat more omega-3 fats. Choose salmon, mackerel, sardines, tuna, flaxseed oil, and ground flaxseeds. Aim to eat fish at least 2 times each week. Check food labels carefully to identify foods with trans fats or high amounts of saturated fat. Limit saturated fats. These are found in animal products, such as meats, butter, and cream. Plant sources of saturated  fats include palm oil, palm kernel oil, and coconut oil. Avoid foods with partially hydrogenated oils in them. These contain trans fats. Examples are stick margarine, some tub margarines, cookies, crackers, and other baked goods. Avoid fried foods. General information Eat more home-cooked food and less restaurant, buffet, and fast food. Limit or avoid alcohol. Limit foods that are high in added sugar and simple starches such as foods made using white refined flour (white breads, pastries, sweets). Lose weight if you are overweight. Losing just 5-10% of your body weight can help your overall health and prevent diseases such as diabetes and heart disease. Monitor your sodium intake, especially if you have high blood pressure. Talk with your health care provider about your sodium intake. Try to incorporate more vegetarian meals weekly. What foods should I eat? Fruits All fresh, canned (in natural juice), or frozen fruits. Vegetables Fresh or frozen vegetables (raw, steamed, roasted, or grilled). Green salads. Grains Most grains. Choose whole wheat and whole grains most of the time. Rice and pasta, including brown rice and pastas made with whole wheat. Meats and other proteins Lean, well-trimmed beef, veal, pork, and lamb. Chicken and [place] without skin. All fish and shellfish. Wild duck, rabbit, pheasant, and venison. Egg whites or low-cholesterol egg substitutes. Dried beans, peas, lentils, and  tofu. Seeds and most nuts. Dairy Low-fat or nonfat cheeses, including ricotta and mozzarella. Skim or 1% milk (liquid, powdered, or evaporated). Buttermilk made with low-fat milk. Nonfat or low-fat yogurt. Fats and oils Non-hydrogenated (trans-free) margarines. Vegetable oils, including soybean, sesame, sunflower, olive, avocado, peanut, safflower, corn, canola, and cottonseed. Salad dressings or mayonnaise made with a vegetable oil. Beverages Water (mineral or sparkling). Coffee and tea. Unsweetened  ice tea. Diet beverages. Sweets and desserts Sherbet, gelatin, and fruit ice. Small amounts of dark chocolate. Limit all sweets and desserts. Seasonings and condiments All seasonings and condiments. The items listed above may not be a complete list of foods and beverages you can eat. Contact a dietitian for more options. What foods should I avoid? Fruits Canned fruit in heavy syrup. Fruit in cream or butter sauce. Fried fruit. Limit coconut. Vegetables Vegetables cooked in cheese, cream, or butter sauce. Fried vegetables. Grains Breads made with saturated or trans fats, oils, or whole milk. Croissants. Sweet rolls. Donuts. High-fat crackers, such as cheese crackers and chips. Meats and other proteins Fatty meats, such as hot dogs, ribs, sausage, bacon, rib-eye roast or steak. High-fat deli meats, such as salami and bologna. Caviar. Domestic duck and goose. Organ meats, such as liver. Dairy Cream, sour cream, cream cheese, and creamed cottage cheese. Whole-milk cheeses. Whole or 2% milk (liquid, evaporated, or condensed). Whole buttermilk. Cream sauce or high-fat cheese sauce. Whole-milk yogurt. Fats and oils Meat fat, or shortening. Cocoa butter, hydrogenated oils, palm oil, coconut oil, palm kernel oil. Solid fats and shortenings, including bacon fat, salt pork, lard, and butter. Nondairy cream substitutes. Salad dressings with cheese or sour cream. Beverages Regular sodas and any drinks with added sugar. Sweets and desserts Frosting. Pudding. Cookies. Cakes. Pies. Milk chocolate or white chocolate. Buttered syrups. Full-fat ice cream or ice cream drinks. The items listed above may not be a complete list of foods and beverages to avoid. Contact a dietitian for more information. Summary Heart-healthy meal planning includes limiting unhealthy fats, increasing healthy fats, limiting salt (sodium) intake and making other diet and lifestyle changes. Lose weight if you are overweight. Losing  just 5-10% of your body weight can help your overall health and prevent diseases such as diabetes and heart disease. Focus on eating a balance of foods, including fruits and vegetables, low-fat or nonfat dairy, lean protein, nuts and legumes, whole grains, and heart-healthy oils and fats. This information is not intended to replace advice given to you by your health care provider. Make sure you discuss any questions you have with your health care provider. Document Revised: 01/21/2022 Document Reviewed: 01/21/2022 Elsevier Patient Education  2024 [employer].

## 2023-11-03 ENCOUNTER — Ambulatory Visit (INDEPENDENT_AMBULATORY_CARE_PROVIDER_SITE_OTHER): Payer: Medicare Other | Admitting: Family

## 2023-11-03 VITALS — BP 165/84 | HR 60 | Temp 98.0°F | Ht 71.0 in | Wt 253.0 lb

## 2023-11-03 DIAGNOSIS — J0110 Acute frontal sinusitis, unspecified: Secondary | ICD-10-CM | POA: Diagnosis not present

## 2023-11-03 DIAGNOSIS — I10 Essential (primary) hypertension: Secondary | ICD-10-CM | POA: Diagnosis not present

## 2023-11-03 MED ORDER — AMOXICILLIN-POT CLAVULANATE 875-125 MG PO TABS
1.0000 | ORAL_TABLET | Freq: Two times a day (BID) | ORAL | 0 refills | Status: DC
Start: 2023-11-03 — End: 2024-01-05

## 2023-11-03 NOTE — Progress Notes (Signed)
Patient ID: Bruce Ball, male    DOB: 02-04-1946, 77 y.o.   MRN: [id number]  Chief Complaint  Patient presents with   Sinus Problem    Pt c/o Cough with yellow mucus, Nasal congestions,headaches, Present for a week. Has tried nasal spray,saline flush and zyrtec which did not help sx.    Discussed the use of AI scribe software for clinical note transcription with the patient, who gave verbal consent to proceed.  History of Present Illness   The patient, with a history of chronic sinusitis, presents with ongoing symptoms despite using saline nasal spray, Zyrtec, and Flonase. He reports a greenish-yellow discharge from his left nostril and a watery discharge from the right. He has a history of sinus surgery for polyp removal earlier this year, which initially improved his symptoms. The patient has hypertension, managed with amlodipine and carvedilol, and his blood pressure was elevated during the visit. He also has hearing loss in the right ear.     Assessment & Plan:     Chronic Sinusitis - Patient with recent history of sinus surgery for polyps and chronic sinusitis, currently experiencing symptoms of congestion, pressure, and greenish-yellow drainage. No fever or chills. -Increase Flonase to twice daily for the next 3 days & when feeling symptomatic in the future to help prevent an infection. -Prescribing Augmentin for 1 week, reminded pt on use & SE.  Hypertension - Elevated blood pressure noted during visit, patient currently on Amlodipine and Carvedilol. -Advise patient to take an extra dose of Amlodipine today due to elevated blood pressure. -F/U with PCP for ongoing management.      Subjective:    Outpatient Medications Prior to Visit  Medication Sig Dispense Refill   acetaminophen (TYLENOL) 500 MG tablet Take 1,000 mg by mouth every 6 (six) hours as needed for headache, fever, moderate pain or mild pain.     amLODipine (NORVASC) 2.5 MG tablet Take 2.5 mg by mouth as needed.      apixaban (ELIQUIS) 2.5 MG TABS tablet Take 1 tablet (2.5 mg total) by mouth 2 (two) times daily.     atorvastatin (LIPITOR) 20 MG tablet Take 1 tablet (20 mg total) by mouth at bedtime. (Patient taking differently: Take 20 mg by mouth at bedtime.) 90 tablet 3   carvedilol (COREG) 25 MG tablet Take 25 mg by mouth 2 (two) times daily with a meal.     cetirizine (ZYRTEC) 10 MG tablet Take 10 mg by mouth as needed for allergies.     Cholecalciferol (VITAMIN D3) 50 MCG (2000 UT) capsule Take 2,000 Units by mouth in the morning and at bedtime.      fluticasone (FLONASE) 50 MCG/ACT nasal spray Place 1-2 sprays into both nostrils daily as needed for allergies or rhinitis.     furosemide (LASIX) 20 MG tablet Take 20 mg by mouth daily as needed for fluid (if the feet and/or legs swell).     Multiple Vitamin (MULTIVITAMIN) capsule Take 1 capsule by mouth daily.     oxyCODONE-acetaminophen (PERCOCET) 5-325 MG tablet Take 1 tablet by mouth every 6 (six) hours as needed for severe pain or moderate pain (post-operatively). 15 tablet 0   pantoprazole (PROTONIX) 40 MG tablet Take 40 mg by mouth 2 (two) times daily before a meal.     potassium citrate (UROCIT-K) 10 MEQ (1080 MG) SR tablet Take 1 tablet (10 mEq total) by mouth as directed. 90 tablet 1   senna-docusate (SENOKOT-S) 8.6-50 MG tablet Take 1 tablet by  mouth 2 (two) times daily. While taking strong pain meds to prevent constipation 10 tablet 0   Zoledronic Acid (RECLAST IV) Inject into the vein. Yearly for osteoposis     No facility-administered medications prior to visit.   Past Medical History:  Diagnosis Date   Anemia associated with chronic renal failure    At high risk for falls    due to gait disorders;   recurrent falls   (05-12-2023  per pt last fall 2 weeks ago)   Chronic anticoagulation    eliquis--- managed by pcp   Coronary artery calcification seen on CAT scan    followed by dr t. turner---   05-31-2021  calcium score= 152    Deafness in right ear    ED (erectile dysfunction)    Edema of both lower extremities    Essential tremor    Gait disorder    imbalance when walking uses cane/ walker;  recurrent falls   GERD (gastroesophageal reflux disease)    Hematuria    Hiatal hernia    History of adenomatous polyp of colon    History of agent Orange exposure    History of basal cell carcinoma (BCC) excision    History of DVT of lower extremity 12/2019   admission in epic ;   w/ PE's all lobes   History of esophageal dilatation 12/2019   dr Adela Lank   History of kidney stones    History of peptic ulcer 2009   History of prostate cancer 12/2001   followed by dr Berneice Heinrich;   first dx 01/ 2003,  Gleason 3+3,   03-10-2002  s/p  radical prostatectomy pelvic lymph node disections , no recurrence   History of pulmonary embolus (PE) 12/2019   admission in epic;   submassive all lobes and dvt -lle   History of sarcoma of bone 1970   orthopedic oncologist--- dr w. 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 turner;  monitor in epic 04-08-2023 low PVC burden and atrial ectopy   Renal calculi    bilateral   Stage 3 chronic kidney disease [facility])    nephrologist--- dr Ronalee Belts   Thyroid nodule 2016   endocrinologist--- dr Dewitt Hoes  [facility])  hx benign bx, right side   Urinary incontinence    total incontinence   Wears hearing aid in left ear    Past Surgical History:  Procedure Laterality Date    CYSTOSCOPY WITH RETROGRADE PYELOGRAM, URETEROSCOPY AND STENT PLACEMENT Right 11/06/2017   Procedure: CYSTOSCOPY WITH  STENT PLACEMENT RIGHT;  Surgeon: Heloise Purpura, MD;  Location: [facility];  Service: Urology;  Laterality: Right;   CYSTOSCOPY WITH RETROGRADE PYELOGRAM, URETEROSCOPY AND STENT PLACEMENT Left 06/09/2019   Procedure: CYSTOSCOPY WITH RETROGRADE PYELOGRAM, URETEROSCOPY AND STENT PLACEMENT X2;  Surgeon: Sebastian Ache, MD;  Location: [facility];  Service: Urology;  Laterality: Left;   CYSTOSCOPY WITH RETROGRADE PYELOGRAM, URETEROSCOPY AND STENT PLACEMENT Bilateral 03/01/2020   Procedure: CYSTOSCOPY WITH  RETROGRADE PYELOGRAM, URETEROSCOPY AND STENT PLACEMENT;  Surgeon: Sebastian Ache, MD;  Location: [facility];  Service: Urology;  Laterality: Bilateral;  75 MINS   CYSTOSCOPY WITH RETROGRADE PYELOGRAM, URETEROSCOPY AND STENT PLACEMENT Bilateral 05/16/2023   Procedure: CYSTOSCOPY WITH RETROGRADE PYELOGRAM, URETEROSCOPY AND STENT PLACEMENT;  Surgeon: Loletta Parish., MD;  Location: [facility];  Service: Urology;  Laterality: Bilateral;  90 MINS   CYSTOSCOPY/RETROGRADE/URETEROSCOPY  2020   at [facility] in [place]   CYSTOSCOPY/URETEROSCOPY/HOLMIUM LASER Right 11/24/2017   Procedure: CYSTOSCOPY/URETEROSCOPY/ RETROGRADE/STENT REMOVAL;  Surgeon: Heloise Purpura, MD;  Location: [facility];  Service: Urology;  Laterality: Right;   FEMORAL OSTEOTOMY W/ RODDING  1970   right proximal femUr resection replacement w/ custom prosthesis   FEMUR FRACTURE SURGERY Right 10/17/2021   @ [facility] by dr w. eward;  REVISION RIGHT PROXIMAL FEMUR REPLACEMENT IN SETTING OF DISTAL PERIPROSTHETIC FEMUR FRACTURE, BOTH COMPONENTS   FUNCTIONAL ENDOSCOPIC SINUS SURGERY  03/19/2023   @ [facility]   HOLMIUM LASER APPLICATION Bilateral 03/01/2020   Procedure: HOLMIUM LASER APPLICATION;  Surgeon: Sebastian Ache, MD;  Location: [facility];  Service: Urology;  Laterality: Bilateral;   HOLMIUM LASER APPLICATION Bilateral 05/16/2023    Procedure: HOLMIUM LASER APPLICATION;  Surgeon: Loletta Parish., MD;  Location: [facility];  Service: Urology;  Laterality: Bilateral;   LUMBAR SPINE SURGERY  07/2017   @ [facility]  by dr Newell Coral;    L5-- S1  laminectomy   POSTERIOR LUMBAR FUSION  06/28/2020   @ [facility] [place];   L5--S1   PROSTATECTOMY  03/10/2002   @[facility]  by dr Aldean Ast;    w/ pelvic lymph node dissection's   SPINAL CORD STIMULATOR REMOVAL  04/15/2016   W/   THORACIC PARTIAL LAMINECTOMY T9   Allergies  Allergen Reactions   Tape Other (See Comments)    NEEDS COBAN WRAP- NO TAPE!!!!- SKIN TEARS VERY EASILY!!!!   Erythromycin Nausea And Vomiting   Lisinopril Other (See Comments)    Lowered the blood pressure TOO MUCH!!      Objective:    Physical Exam Vitals and nursing note reviewed.  Constitutional:      General: He is not in acute distress.    Appearance: Normal appearance.  HENT:     Head: Normocephalic.     Right Ear: Tympanic membrane and ear canal normal.     Left Ear: Tympanic membrane and ear canal normal.     Nose:     Right Sinus: Frontal sinus tenderness present. No maxillary sinus tenderness.     Left Sinus: Frontal sinus tenderness present. No maxillary sinus tenderness.     Mouth/Throat:     Mouth: Mucous membranes are moist.     Pharynx: No pharyngeal swelling, oropharyngeal exudate, posterior oropharyngeal erythema or uvula swelling.     Tonsils: No tonsillar exudate or tonsillar abscesses.  Cardiovascular:     Rate and Rhythm: Normal rate and regular rhythm.  Pulmonary:     Effort: Pulmonary effort is normal.     Breath sounds: Normal breath sounds.  Musculoskeletal:        General: Normal range of motion.     Cervical back: Normal range of motion.  Lymphadenopathy:     Head:     Right side of head: No preauricular or posterior auricular adenopathy.     Left side of head: No preauricular or posterior auricular adenopathy.      Cervical: No cervical adenopathy.  Skin:  General: Skin is warm and dry.  Neurological:     Mental Status: He is alert and oriented to person, place, and time.  Psychiatric:        Mood and Affect: Mood normal.    BP (!) 165/84 (BP Location: Left Arm, Patient Position: Sitting, Cuff Size: Large)   Pulse 60   Temp 98 F (36.7 C) (Temporal)   Ht 5\' 11"  (1.803 m)   Wt 253 lb (114.8 kg)   SpO2 98%   BMI 35.29 kg/m  Wt Readings from Last 3 Encounters:  11/03/23 253 lb (114.8 kg)  10/28/23 251 lb 12.8 oz (114.2 kg)  09/02/23 248 lb 12.8 oz (112.9 kg)      Dulce Sellar, NP

## 2023-11-14 IMAGING — DX DG CHEST 2V
2 series · 2 of 2 positions shown · non-contrast
Comparison: 12/23/2021

CLINICAL DATA: Cough

EXAM:
CHEST - 2 VIEW

[chest pa]
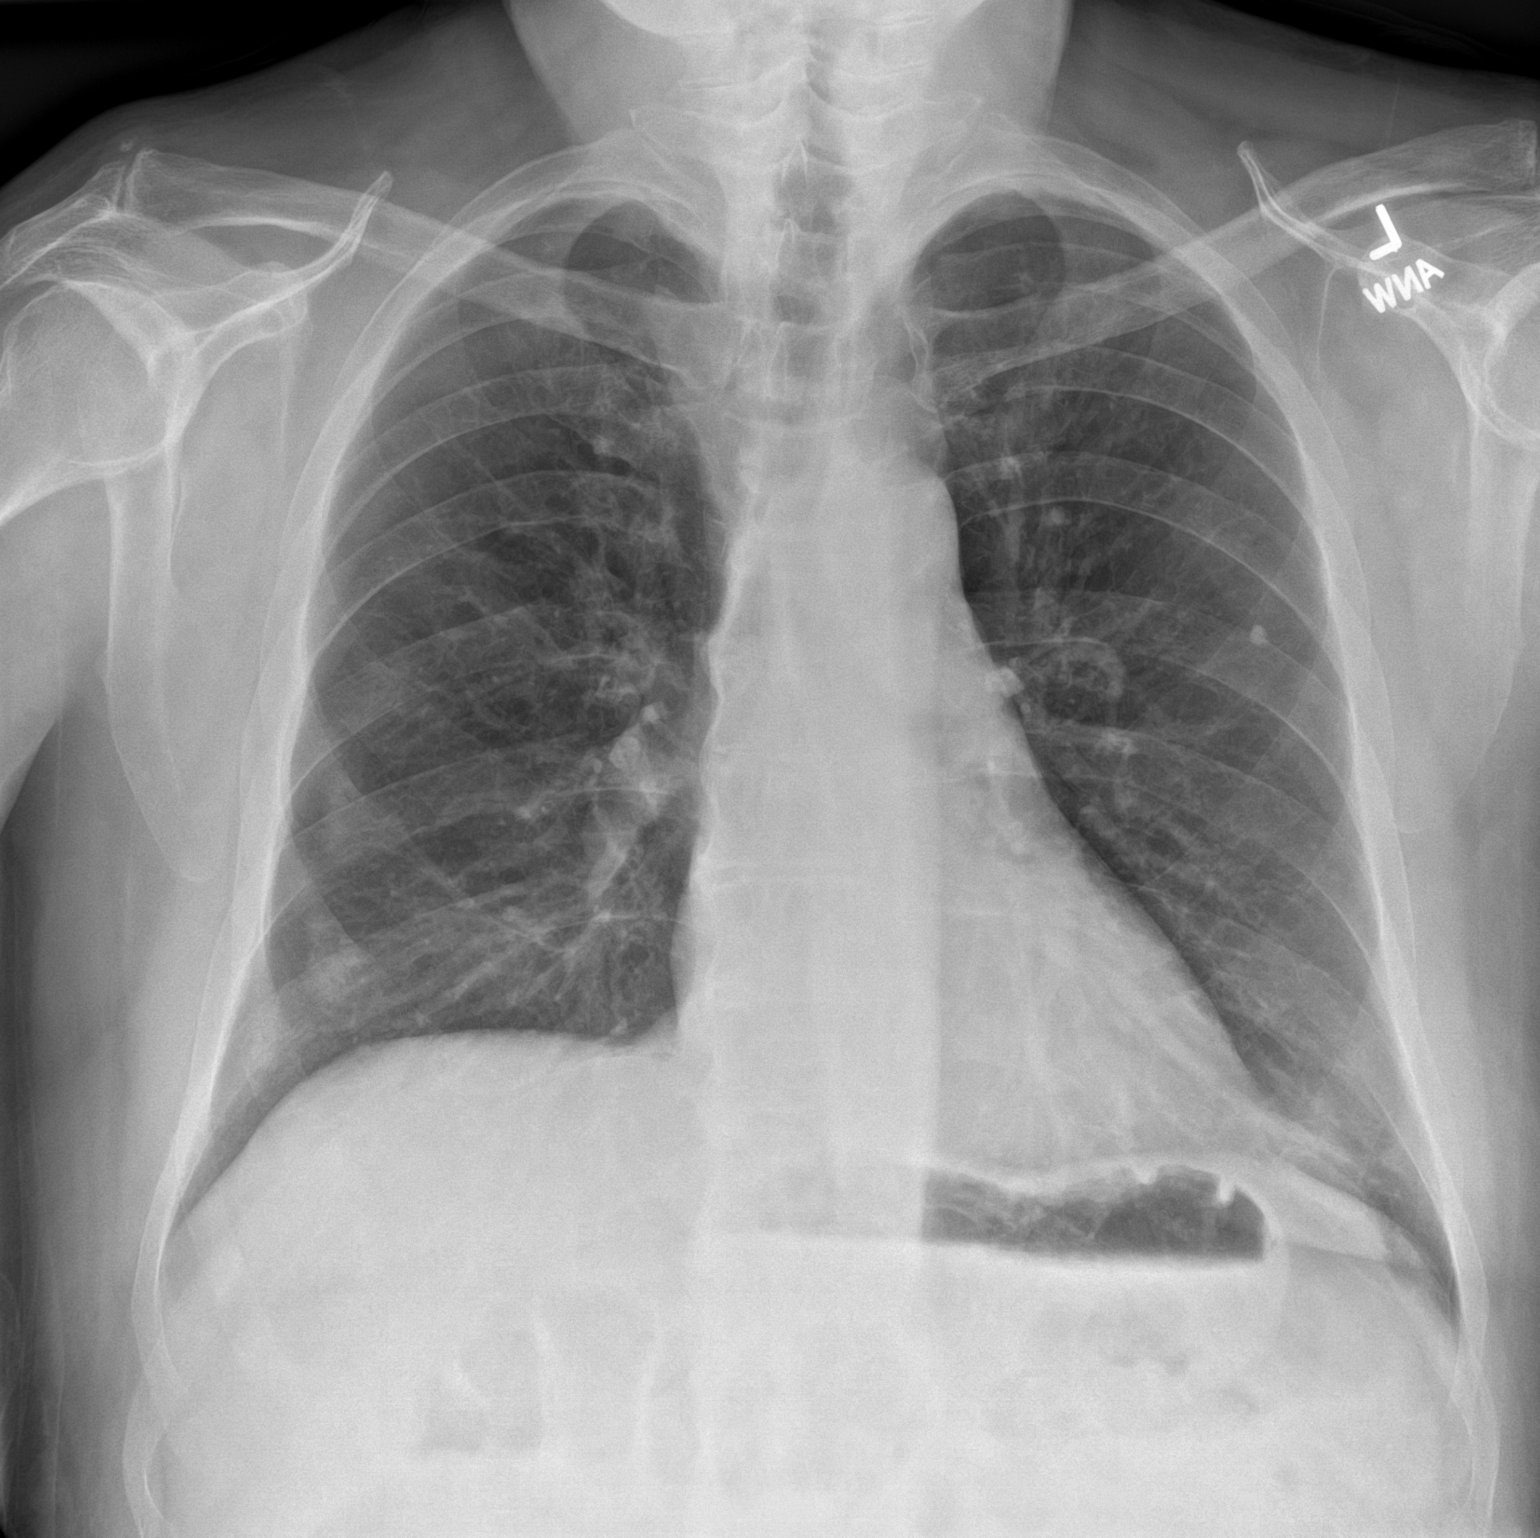

[chest lat]
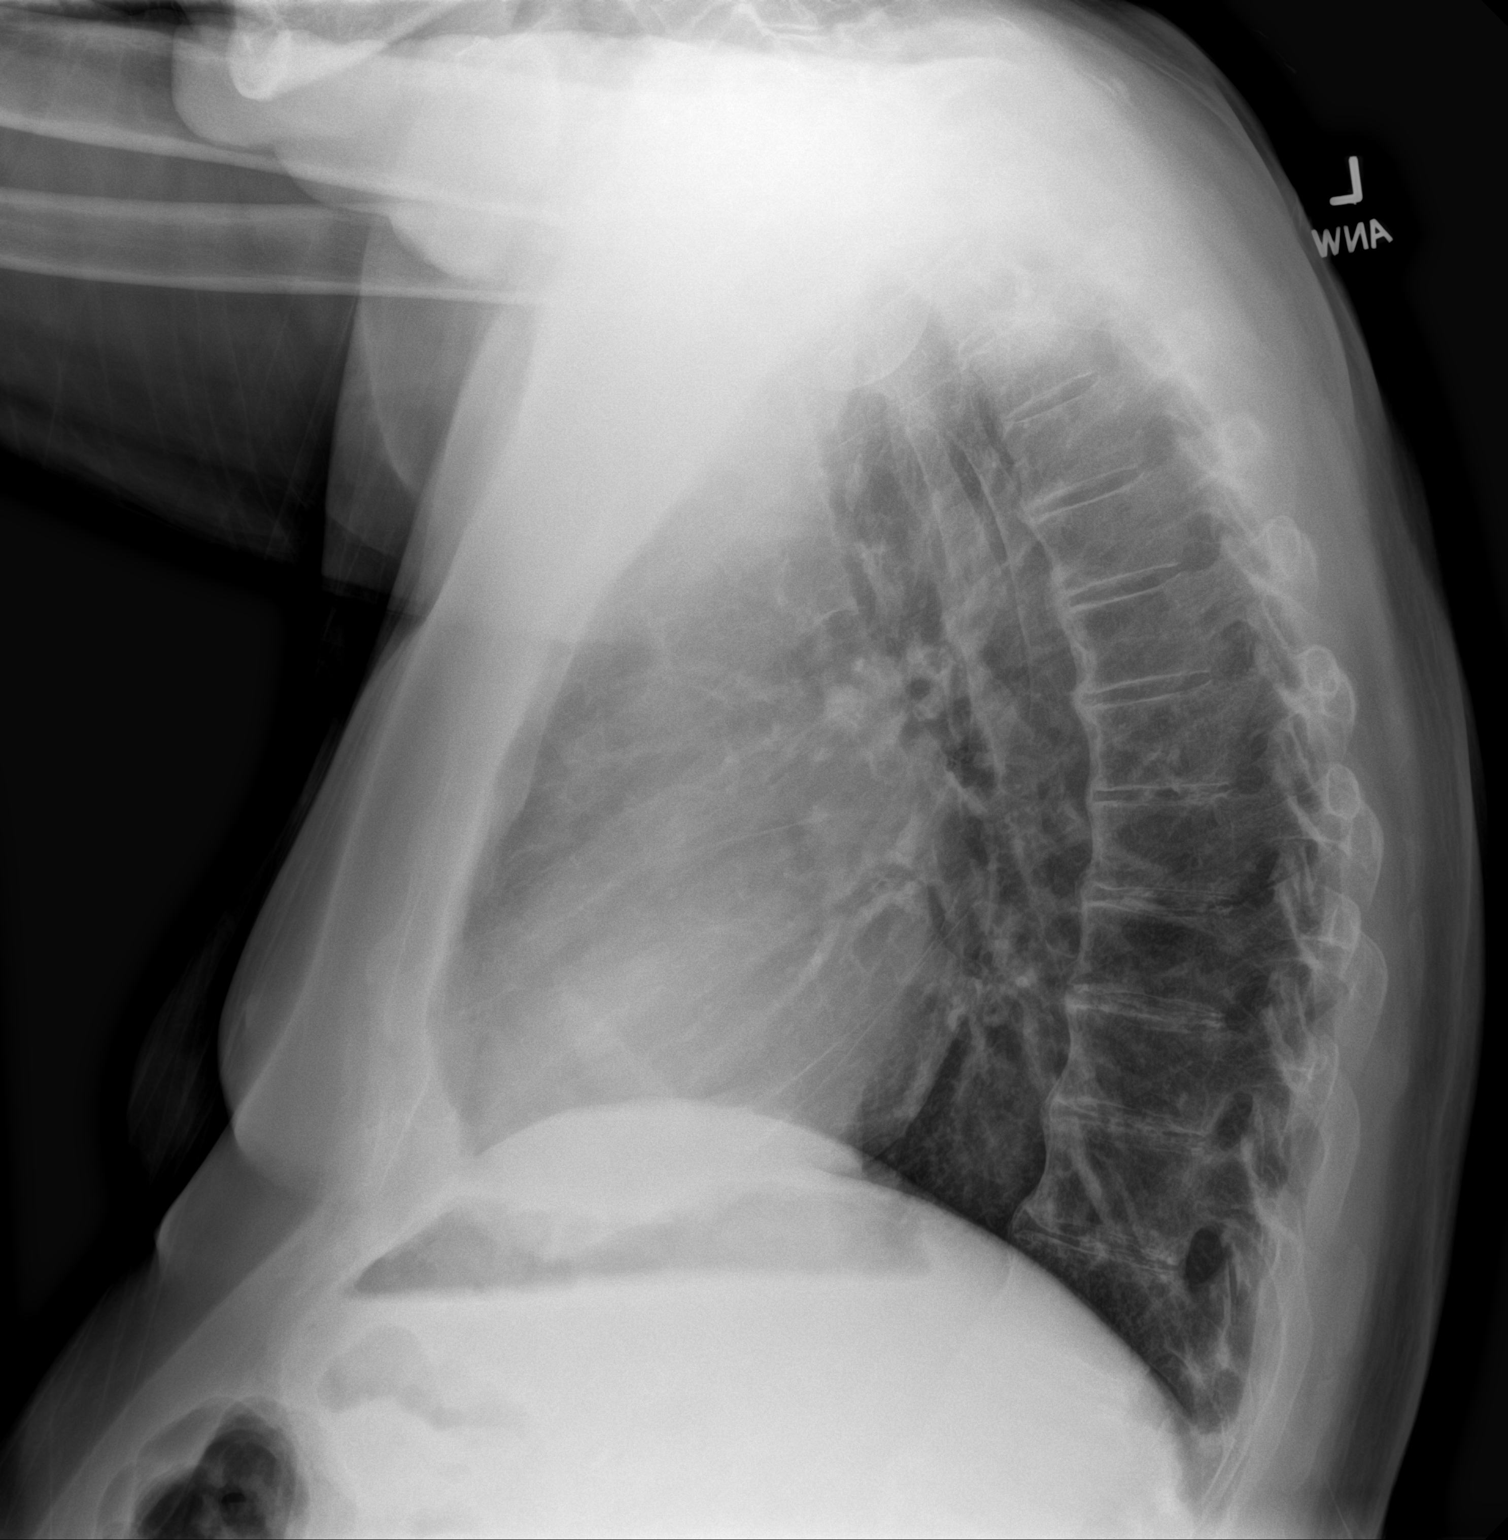

[2 of 2 positions shown; findings below may reference images not displayed]

FINDINGS: Cardiac size is within normal limits. There are no signs of
pulmonary edema. Small patchy foci of increased density are noted in
the anterolateral aspect of right fifth, sixth and seventh ribs.
Small linear densities seen at the left cardiophrenic angle. There
is calcified granuloma in the left mid lung fields. There is no
pleural effusion or pneumothorax.
IMPRESSION: There are new small patchy foci of increased density in the right
lower lung fields overlying the anterolateral aspect of right fifth,
sixth and seventh ribs possibly suggesting healing fractures. Less
likely possibility would be patchy pneumonia in the right lower
lobe.

## 2023-11-28 DIAGNOSIS — J209 Acute bronchitis, unspecified: Secondary | ICD-10-CM | POA: Diagnosis not present

## 2023-12-02 DIAGNOSIS — J3089 Other allergic rhinitis: Secondary | ICD-10-CM | POA: Diagnosis not present

## 2023-12-02 DIAGNOSIS — J339 Nasal polyp, unspecified: Secondary | ICD-10-CM | POA: Diagnosis not present

## 2023-12-02 DIAGNOSIS — J328 Other chronic sinusitis: Secondary | ICD-10-CM | POA: Diagnosis not present

## 2023-12-02 DIAGNOSIS — J3489 Other specified disorders of nose and nasal sinuses: Secondary | ICD-10-CM | POA: Diagnosis not present

## 2023-12-02 DIAGNOSIS — Z9889 Other specified postprocedural states: Secondary | ICD-10-CM | POA: Diagnosis not present

## 2023-12-02 DIAGNOSIS — J343 Hypertrophy of nasal turbinates: Secondary | ICD-10-CM | POA: Diagnosis not present

## 2024-01-05 ENCOUNTER — Emergency Department (HOSPITAL_COMMUNITY): Payer: Medicare Other

## 2024-01-05 ENCOUNTER — Other Ambulatory Visit: Payer: Self-pay

## 2024-01-05 ENCOUNTER — Encounter (HOSPITAL_COMMUNITY): Payer: Self-pay | Admitting: Emergency Medicine

## 2024-01-05 ENCOUNTER — Observation Stay (HOSPITAL_COMMUNITY)
Admission: EM | Admit: 2024-01-05 | Discharge: 2024-01-07 | Disposition: A | Discharge status: Home / Self Care | Payer: Medicare Other | Attending: Emergency Medicine | Admitting: Emergency Medicine

## 2024-01-05 ENCOUNTER — Observation Stay (HOSPITAL_COMMUNITY): Payer: Medicare Other

## 2024-01-05 DIAGNOSIS — Z86711 Personal history of pulmonary embolism: Secondary | ICD-10-CM | POA: Diagnosis not present

## 2024-01-05 DIAGNOSIS — R55 Syncope and collapse: Principal | ICD-10-CM | POA: Diagnosis present

## 2024-01-05 DIAGNOSIS — Z96641 Presence of right artificial hip joint: Secondary | ICD-10-CM | POA: Diagnosis not present

## 2024-01-05 DIAGNOSIS — R0689 Other abnormalities of breathing: Secondary | ICD-10-CM | POA: Diagnosis not present

## 2024-01-05 DIAGNOSIS — I2510 Atherosclerotic heart disease of native coronary artery without angina pectoris: Secondary | ICD-10-CM | POA: Diagnosis not present

## 2024-01-05 DIAGNOSIS — J324 Chronic pansinusitis: Secondary | ICD-10-CM | POA: Insufficient documentation

## 2024-01-05 DIAGNOSIS — R0902 Hypoxemia: Secondary | ICD-10-CM | POA: Diagnosis not present

## 2024-01-05 DIAGNOSIS — Z86718 Personal history of other venous thrombosis and embolism: Secondary | ICD-10-CM | POA: Diagnosis not present

## 2024-01-05 DIAGNOSIS — S0990XA Unspecified injury of head, initial encounter: Secondary | ICD-10-CM | POA: Diagnosis not present

## 2024-01-05 DIAGNOSIS — Z85830 Personal history of malignant neoplasm of bone: Secondary | ICD-10-CM | POA: Diagnosis not present

## 2024-01-05 DIAGNOSIS — R509 Fever, unspecified: Secondary | ICD-10-CM | POA: Diagnosis not present

## 2024-01-05 DIAGNOSIS — R531 Weakness: Secondary | ICD-10-CM | POA: Diagnosis not present

## 2024-01-05 DIAGNOSIS — I129 Hypertensive chronic kidney disease with stage 1 through stage 4 chronic kidney disease, or unspecified chronic kidney disease: Secondary | ICD-10-CM | POA: Insufficient documentation

## 2024-01-05 DIAGNOSIS — G8929 Other chronic pain: Secondary | ICD-10-CM | POA: Diagnosis not present

## 2024-01-05 DIAGNOSIS — Z7901 Long term (current) use of anticoagulants: Secondary | ICD-10-CM | POA: Insufficient documentation

## 2024-01-05 DIAGNOSIS — N1831 Chronic kidney disease, stage 3a: Secondary | ICD-10-CM | POA: Diagnosis not present

## 2024-01-05 DIAGNOSIS — Z85828 Personal history of other malignant neoplasm of skin: Secondary | ICD-10-CM | POA: Diagnosis not present

## 2024-01-05 DIAGNOSIS — Z8546 Personal history of malignant neoplasm of prostate: Secondary | ICD-10-CM | POA: Diagnosis not present

## 2024-01-05 DIAGNOSIS — I6782 Cerebral ischemia: Secondary | ICD-10-CM | POA: Diagnosis not present

## 2024-01-05 DIAGNOSIS — Z79899 Other long term (current) drug therapy: Secondary | ICD-10-CM | POA: Diagnosis not present

## 2024-01-05 DIAGNOSIS — S299XXA Unspecified injury of thorax, initial encounter: Secondary | ICD-10-CM | POA: Diagnosis not present

## 2024-01-05 DIAGNOSIS — Z043 Encounter for examination and observation following other accident: Secondary | ICD-10-CM | POA: Diagnosis not present

## 2024-01-05 DIAGNOSIS — Z87891 Personal history of nicotine dependence: Secondary | ICD-10-CM | POA: Diagnosis not present

## 2024-01-05 DIAGNOSIS — R410 Disorientation, unspecified: Secondary | ICD-10-CM | POA: Diagnosis not present

## 2024-01-05 DIAGNOSIS — G319 Degenerative disease of nervous system, unspecified: Secondary | ICD-10-CM | POA: Diagnosis not present

## 2024-01-05 DIAGNOSIS — W19XXXA Unspecified fall, initial encounter: Secondary | ICD-10-CM | POA: Diagnosis not present

## 2024-01-05 DIAGNOSIS — I700 Atherosclerosis of aorta: Secondary | ICD-10-CM | POA: Diagnosis not present

## 2024-01-05 DIAGNOSIS — M25551 Pain in right hip: Secondary | ICD-10-CM | POA: Diagnosis not present

## 2024-01-05 DIAGNOSIS — I10 Essential (primary) hypertension: Secondary | ICD-10-CM | POA: Diagnosis not present

## 2024-01-05 DIAGNOSIS — S199XXA Unspecified injury of neck, initial encounter: Secondary | ICD-10-CM | POA: Diagnosis not present

## 2024-01-05 LAB — CBC WITH DIFFERENTIAL/PLATELET
Abs Immature Granulocytes: 0.03 10*3/uL (ref 0.00–0.07)
Basophils Absolute: 0.1 10*3/uL (ref 0.0–0.1)
Basophils Relative: 1 %
Eosinophils Absolute: 0.1 10*3/uL (ref 0.0–0.5)
Eosinophils Relative: 1 %
HCT: 49 % (ref 39.0–52.0)
Hemoglobin: 16 g/dL (ref 13.0–17.0)
Immature Granulocytes: 0 %
Lymphocytes Relative: 12 %
Lymphs Abs: 0.9 10*3/uL (ref 0.7–4.0)
MCH: 29 pg (ref 26.0–34.0)
MCHC: 32.7 g/dL (ref 30.0–36.0)
MCV: 88.9 fL (ref 80.0–100.0)
Monocytes Absolute: 1 10*3/uL (ref 0.1–1.0)
Monocytes Relative: 13 %
Neutro Abs: 5.8 10*3/uL (ref 1.7–7.7)
Neutrophils Relative %: 73 %
Platelets: 171 10*3/uL (ref 150–400)
RBC: 5.51 MIL/uL (ref 4.22–5.81)
RDW: 13.5 % (ref 11.5–15.5)
WBC: 7.8 10*3/uL (ref 4.0–10.5)
nRBC: 0 % (ref 0.0–0.2)

## 2024-01-05 LAB — URINALYSIS, W/ REFLEX TO CULTURE (INFECTION SUSPECTED)
Bacteria, UA: NONE SEEN
Bilirubin Urine: NEGATIVE
Glucose, UA: NEGATIVE mg/dL
Ketones, ur: 20 mg/dL — AB
Leukocytes,Ua: NEGATIVE
Nitrite: NEGATIVE
Protein, ur: 30 mg/dL — AB
Specific Gravity, Urine: 1.018 (ref 1.005–1.030)
pH: 6 (ref 5.0–8.0)

## 2024-01-05 LAB — PROTIME-INR
INR: 1.2 (ref 0.8–1.2)
Prothrombin Time: 15.1 s (ref 11.4–15.2)

## 2024-01-05 LAB — COMPREHENSIVE METABOLIC PANEL
ALT: 21 U/L (ref 0–44)
AST: 27 U/L (ref 15–41)
Albumin: 3.5 g/dL (ref 3.5–5.0)
Alkaline Phosphatase: 62 U/L (ref 38–126)
Anion gap: 12 (ref 5–15)
BUN: 15 mg/dL (ref 8–23)
CO2: 20 mmol/L — ABNORMAL LOW (ref 22–32)
Calcium: 8.7 mg/dL — ABNORMAL LOW (ref 8.9–10.3)
Chloride: 106 mmol/L (ref 98–111)
Creatinine, Ser: 1.5 mg/dL — ABNORMAL HIGH (ref 0.61–1.24)
GFR, Estimated: 48 mL/min — ABNORMAL LOW (ref >60)
Glucose, Bld: 102 mg/dL — ABNORMAL HIGH (ref 70–99)
Potassium: 3.9 mmol/L (ref 3.5–5.1)
Sodium: 138 mmol/L (ref 135–145)
Total Bilirubin: 1.2 mg/dL (ref 0.0–1.2)
Total Protein: 6.6 g/dL (ref 6.5–8.1)

## 2024-01-05 LAB — TSH: TSH: 0.44 u[IU]/mL (ref 0.350–4.500)

## 2024-01-05 LAB — VITAMIN B12: Vitamin B-12: 299 pg/mL (ref 180–914)

## 2024-01-05 LAB — CBG MONITORING, ED: Glucose-Capillary: 106 mg/dL — ABNORMAL HIGH (ref 70–99)

## 2024-01-05 LAB — TROPONIN I (HIGH SENSITIVITY)
Troponin I (High Sensitivity): 20 ng/L — ABNORMAL HIGH (ref <18)
Troponin I (High Sensitivity): 26 ng/L — ABNORMAL HIGH (ref <18)

## 2024-01-05 LAB — MAGNESIUM: Magnesium: 1.9 mg/dL (ref 1.7–2.4)

## 2024-01-05 LAB — PHOSPHORUS: Phosphorus: 2.7 mg/dL (ref 2.5–4.6)

## 2024-01-05 LAB — VITAMIN D 25 HYDROXY (VIT D DEFICIENCY, FRACTURES): Vit D, 25-Hydroxy: 71.26 ng/mL (ref 30–100)

## 2024-01-05 LAB — BRAIN NATRIURETIC PEPTIDE: B Natriuretic Peptide: 58.3 pg/mL (ref 0.0–100.0)

## 2024-01-05 MED ORDER — LIDOCAINE 5 % EX PTCH
1.0000 | MEDICATED_PATCH | CUTANEOUS | Status: DC
  Administered 2024-01-05 – 2024-01-06 (×2): 1 via TRANSDERMAL
  Filled 2024-01-05 (×3): qty 1

## 2024-01-05 MED ORDER — ACETAMINOPHEN 500 MG PO TABS
1000.0000 mg | ORAL_TABLET | Freq: Once | ORAL | Status: AC
  Administered 2024-01-05: 1000 mg via ORAL
  Filled 2024-01-05: qty 2

## 2024-01-05 MED ORDER — HYDROMORPHONE HCL 2 MG PO TABS
2.0000 mg | ORAL_TABLET | Freq: Once | ORAL | Status: AC
  Administered 2024-01-05: 2 mg via ORAL
  Filled 2024-01-05: qty 1

## 2024-01-05 MED ORDER — ONDANSETRON HCL 4 MG/2ML IJ SOLN
4.0000 mg | Freq: Four times a day (QID) | INTRAMUSCULAR | Status: DC | PRN
  Administered 2024-01-06: 4 mg via INTRAVENOUS
  Filled 2024-01-05: qty 2

## 2024-01-05 MED ORDER — METHOCARBAMOL 500 MG PO TABS: 500.0000 mg | ORAL_TABLET | Freq: Three times a day (TID) | ORAL | 0 refills | Status: DC | PRN

## 2024-01-05 MED ORDER — ACETAMINOPHEN 325 MG PO TABS: 650.0000 mg | ORAL_TABLET | Freq: Four times a day (QID) | ORAL | Status: DC | PRN

## 2024-01-05 MED ORDER — FLUTICASONE PROPIONATE 50 MCG/ACT NA SUSP: 1.0000 | Freq: Every day | NASAL | Status: DC | PRN

## 2024-01-05 MED ORDER — POTASSIUM CITRATE ER 10 MEQ (1080 MG) PO TBCR
10.0000 meq | EXTENDED_RELEASE_TABLET | Freq: Every day | ORAL | Status: DC
  Administered 2024-01-05 – 2024-01-06 (×2): 10 meq via ORAL
  Filled 2024-01-05 (×4): qty 1

## 2024-01-05 MED ORDER — PANTOPRAZOLE SODIUM 40 MG PO TBEC
40.0000 mg | DELAYED_RELEASE_TABLET | Freq: Two times a day (BID) | ORAL | Status: DC
  Administered 2024-01-05 – 2024-01-07 (×4): 40 mg via ORAL
  Filled 2024-01-05 (×4): qty 1

## 2024-01-05 MED ORDER — ACETAMINOPHEN 650 MG RE SUPP
650.0000 mg | Freq: Four times a day (QID) | RECTAL | Status: DC | PRN
  Administered 2024-01-05: 650 mg via RECTAL
  Filled 2024-01-05: qty 1

## 2024-01-05 MED ORDER — ACETAMINOPHEN 500 MG PO TABS: 1000.0000 mg | ORAL_TABLET | Freq: Four times a day (QID) | ORAL | 0 refills | Status: AC | PRN

## 2024-01-05 MED ORDER — APIXABAN 2.5 MG PO TABS
2.5000 mg | ORAL_TABLET | Freq: Two times a day (BID) | ORAL | Status: DC
  Administered 2024-01-05 – 2024-01-07 (×5): 2.5 mg via ORAL
  Filled 2024-01-05 (×5): qty 1

## 2024-01-05 MED ORDER — OXYCODONE-ACETAMINOPHEN 5-325 MG PO TABS
1.0000 | ORAL_TABLET | Freq: Four times a day (QID) | ORAL | Status: DC | PRN
  Filled 2024-01-05: qty 1

## 2024-01-05 MED ORDER — FUROSEMIDE 20 MG PO TABS
20.0000 mg | ORAL_TABLET | Freq: Every day | ORAL | Status: DC | PRN
  Administered 2024-01-06: 20 mg via ORAL
  Filled 2024-01-05: qty 1

## 2024-01-05 MED ORDER — ONDANSETRON HCL 4 MG PO TABS: 4.0000 mg | ORAL_TABLET | Freq: Four times a day (QID) | ORAL | Status: DC | PRN

## 2024-01-05 MED ORDER — ADULT MULTIVITAMIN W/MINERALS CH
1.0000 | ORAL_TABLET | Freq: Every day | ORAL | Status: DC
  Administered 2024-01-05 – 2024-01-07 (×3): 1 via ORAL
  Filled 2024-01-05 (×3): qty 1

## 2024-01-05 MED ORDER — VITAMIN D 25 MCG (1000 UNIT) PO TABS
2000.0000 [IU] | ORAL_TABLET | Freq: Two times a day (BID) | ORAL | Status: DC
  Administered 2024-01-05 – 2024-01-07 (×5): 2000 [IU] via ORAL
  Filled 2024-01-05 (×5): qty 2

## 2024-01-05 MED ORDER — CARVEDILOL 25 MG PO TABS
25.0000 mg | ORAL_TABLET | Freq: Two times a day (BID) | ORAL | Status: DC
Start: 2024-01-05 — End: 2024-01-07
  Administered 2024-01-05 – 2024-01-07 (×5): 25 mg via ORAL
  Filled 2024-01-05: qty 1
  Filled 2024-01-05: qty 8
  Filled 2024-01-05 (×2): qty 1
  Filled 2024-01-05: qty 2

## 2024-01-05 MED ORDER — HYDRALAZINE HCL 20 MG/ML IJ SOLN
10.0000 mg | Freq: Four times a day (QID) | INTRAMUSCULAR | Status: DC | PRN
  Administered 2024-01-05 – 2024-01-06 (×2): 10 mg via INTRAVENOUS
  Filled 2024-01-05 (×2): qty 1

## 2024-01-05 MED ORDER — IOHEXOL 350 MG/ML SOLN
75.0000 mL | Freq: Once | INTRAVENOUS | Status: AC | PRN
  Administered 2024-01-05: 75 mL via INTRAVENOUS

## 2024-01-05 MED ORDER — LACTATED RINGERS IV BOLUS
500.0000 mL | Freq: Once | INTRAVENOUS | Status: AC
  Administered 2024-01-05: 500 mL via INTRAVENOUS

## 2024-01-05 MED ORDER — SENNOSIDES-DOCUSATE SODIUM 8.6-50 MG PO TABS: 1.0000 | ORAL_TABLET | Freq: Every evening | ORAL | Status: DC | PRN

## 2024-01-05 MED ORDER — ATORVASTATIN CALCIUM 10 MG PO TABS
20.0000 mg | ORAL_TABLET | Freq: Every day | ORAL | Status: DC
  Administered 2024-01-05 – 2024-01-06 (×2): 20 mg via ORAL
  Filled 2024-01-05 (×2): qty 2

## 2024-01-05 MED ORDER — HYDROMORPHONE HCL 4 MG PO TABS: 2.0000 mg | ORAL_TABLET | Freq: Two times a day (BID) | ORAL | 0 refills | Status: DC | PRN

## 2024-01-05 NOTE — ED Notes (Signed)
 MD updated that it is still difficult to arouse patient and keep him awake. Pt not answering any questions or following commands, just groaning

## 2024-01-05 NOTE — Progress Notes (Signed)
 Dr. Kirke Corin [facility] hospitalist notified via chat iquire about getting Lactic acid and blood cultures due to fever awaiting any new orders

## 2024-01-05 NOTE — ED Provider Notes (Signed)
  Physical Exam  BP (!) 176/93   Pulse 79   Temp 98.6 F (37 C) (Oral)   Resp 16   Ht 1.803 m (5' 11)   Wt 114.8 kg   SpO2 95%   BMI 35.30 kg/m   Physical Exam Vitals reviewed.  Neurological:     Mental Status: He is alert.     Procedures  Procedures  ED Course / MDM    Medical Decision Making Amount and/or Complexity of Data Reviewed Labs: ordered. Radiology: ordered. ECG/medicine tests: ordered.  Risk OTC drugs. Prescription drug management.   78 yo male on eliquis  prior pe, fell out of bed, no head trauma noted.  EMS called for lift assist.  Syncope after fall.  Reported increased confusion.  Ho same with uti.  CKD  No uti  No head bleed First troponin 49 78 yo male with multiple health problems, found on ground, then syncopal episode. Now at baseline. Worked up for [employer] insurance Evaluated for infection-no evidence of uti, feveri, pneumonia, or leukocytosis. EKG with no acute changes, slightly elevated troponin History concerning for syncope- ? Secondary to cardiac etiology Plan admission for ongoing evaluation and treatment. Care discussed with Dr.Amponsah on for hospitalist who will see for admission         Levander Houston, MD 01/05/24 [phone]

## 2024-01-05 NOTE — ED Triage Notes (Signed)
 Patient bib [facility] from home for unwitnessed fall. Patient was found on ground by family beside bed. Family reports near syncopal episode after fall and increased weakness and confusion today. Family states that patient has similar presentation when he had UTI's in  the past.

## 2024-01-05 NOTE — ED Notes (Addendum)
 Pt difficult to arouse to fully alert state and seemingly more altered than previously. Patient was previously able to answer all questions and have full conversations, now needing more stimulation to open eyes and respond. MD notified

## 2024-01-05 NOTE — ED Notes (Signed)
 Pt given crackers and told lunch is on the way

## 2024-01-05 NOTE — ED Notes (Signed)
 ED TO INPATIENT HANDOFF REPORT  ED Nurse Name and Phone #: Mitzie [phone]   S Name/Age/Gender Bruce Ball 78 y.o. male Room/Bed: 036C/036C  Code Status   Code Status: Full Code  Home/SNF/Other Home Patient oriented to: self Is this baseline? No   Triage Complete: Triage complete  Chief Complaint Fall [W19.XXXA]  Triage Note Patient bib gcems from home for unwitnessed fall. Patient was found on ground by family beside bed. Family reports near syncopal episode after fall and increased weakness and confusion today. Family states that patient has similar presentation when he had UTI's in  the past.    Allergies Allergies  Allergen Reactions   Tape Other (See Comments)    NEEDS COBAN WRAP- NO TAPE!!!!- SKIN TEARS VERY EASILY!!!!   Erythromycin Nausea And Vomiting   Lisinopril Other (See Comments)    Lowered the blood pressure TOO MUCH!! All ACE Inhibitors per wife at bedside    Level of Care/Admitting Diagnosis ED Disposition     ED Disposition  Admit   Condition  --   Comment  Hospital Area: [facility] [[id number]]  Level of Care: Telemetry Medical [104]  May place patient in observation at [facility] or [facility] if equivalent level of care is available:: No  Covid Evaluation: Asymptomatic - no recent exposure (last 10 days) testing not required  Diagnosis: Fall [[id number]]  Admitting Physician: LOU CLARETTA HERO [[id number]]  Attending Physician: LOU CLARETTA HERO [[id number]]          B Medical/Surgery History Past Medical History:  Diagnosis Date   Anemia associated with chronic renal failure    At high risk for falls    due to gait disorders;   recurrent falls   (05-12-2023  per pt last fall 2 weeks ago)   Chronic anticoagulation    eliquis --- managed by pcp   Coronary artery calcification seen on CAT scan    followed by dr t. turner---   05-31-2021  calcium  score= 152   Deafness in right ear    ED (erectile dysfunction)    Edema  of both lower extremities    Essential tremor    Gait disorder    imbalance when walking uses cane/ walker;  recurrent falls   GERD (gastroesophageal reflux disease)    Hematuria    Hiatal hernia    History of adenomatous polyp of colon    History of agent Orange exposure    History of basal cell carcinoma (BCC) excision    History of DVT of lower extremity 12/2019   admission in epic ;   w/ PE's all lobes   History of esophageal dilatation 12/2019   dr leigh   History of kidney stones    History of peptic ulcer 2009   History of prostate cancer 12/2001   followed by dr alvaro;   first dx 01/ 2003,  Gleason 3+3,   03-10-2002  s/p  radical prostatectomy pelvic lymph node disections , no recurrence   History of pulmonary embolus (PE) 12/2019   admission in epic;   submassive all lobes and dvt -lle   History of sarcoma of bone 1970   orthopedic oncologist--- dr w. 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 shlomo;  monitor in epic 04-08-2023 low PVC burden and atrial ectopy   Renal calculi    bilateral   Stage 3 chronic kidney disease [place])    nephrologist--- dr dolan   Thyroid  nodule 2016   endocrinologist--- dr charlane  [facility])  hx benign bx, right side   Urinary incontinence    total incontinence   Wears hearing aid in left ear    Past Surgical History:  Procedure Laterality Date   CYSTOSCOPY WITH RETROGRADE PYELOGRAM, URETEROSCOPY AND STENT  PLACEMENT Right 11/06/2017   Procedure: CYSTOSCOPY WITH  STENT PLACEMENT RIGHT;  Surgeon: Renda Glance, MD;  Location: [facility];  Service: Urology;  Laterality: Right;   CYSTOSCOPY WITH RETROGRADE PYELOGRAM, URETEROSCOPY AND STENT PLACEMENT Left 06/09/2019   Procedure: CYSTOSCOPY WITH RETROGRADE PYELOGRAM, URETEROSCOPY AND STENT PLACEMENT X2;  Surgeon: Alvaro Hummer, MD;  Location: [facility];  Service: Urology;  Laterality: Left;   CYSTOSCOPY WITH RETROGRADE PYELOGRAM, URETEROSCOPY AND STENT PLACEMENT Bilateral 03/01/2020   Procedure: CYSTOSCOPY WITH RETROGRADE PYELOGRAM, URETEROSCOPY AND STENT PLACEMENT;  Surgeon: Alvaro Hummer, MD;  Location: [facility];  Service: Urology;  Laterality: Bilateral;  75 MINS   CYSTOSCOPY WITH RETROGRADE PYELOGRAM, URETEROSCOPY AND STENT PLACEMENT Bilateral 05/16/2023   Procedure: CYSTOSCOPY WITH RETROGRADE PYELOGRAM, URETEROSCOPY AND STENT PLACEMENT;  Surgeon: Alvaro Hummer KATHEE Mickey., MD;  Location: [facility];  Service: Urology;  Laterality: Bilateral;  90 MINS   CYSTOSCOPY/RETROGRADE/URETEROSCOPY  2020   at [facility] in [place]   CYSTOSCOPY/URETEROSCOPY/HOLMIUM LASER Right 11/24/2017   Procedure: CYSTOSCOPY/URETEROSCOPY/ RETROGRADE/STENT REMOVAL;  Surgeon: Renda Glance, MD;  Location: [facility];  Service: Urology;  Laterality: Right;   FEMORAL OSTEOTOMY W/ RODDING  1970   right proximal femUr resection replacement w/ custom prosthesis   FEMUR FRACTURE SURGERY Right 10/17/2021   @ [facility] by dr w. eward;  REVISION RIGHT PROXIMAL FEMUR REPLACEMENT IN SETTING OF DISTAL PERIPROSTHETIC FEMUR FRACTURE, BOTH COMPONENTS   FUNCTIONAL ENDOSCOPIC SINUS SURGERY  03/19/2023   @ [facility]   HOLMIUM LASER APPLICATION Bilateral 03/01/2020   Procedure: HOLMIUM LASER APPLICATION;  Surgeon: Alvaro Hummer, MD;  Location: [facility];  Service: Urology;  Laterality: Bilateral;   HOLMIUM LASER APPLICATION Bilateral 05/16/2023   Procedure: HOLMIUM LASER APPLICATION;  Surgeon: Alvaro Hummer KATHEE Mickey., MD;  Location: [facility];  Service: Urology;  Laterality: Bilateral;   LUMBAR SPINE SURGERY  07/2017   @ [facility]  by dr alix;    L5-- S1  laminectomy   POSTERIOR LUMBAR FUSION  06/28/2020   @ [facility] in [place];   L5--S1   PROSTATECTOMY  03/10/2002   @[facility]  by dr mardy;    w/ pelvic lymph node dissection's   SPINAL CORD STIMULATOR REMOVAL  04/15/2016   W/   THORACIC PARTIAL LAMINECTOMY T9     A IV Location/Drains/Wounds Patient Lines/Drains/Airways Status     Active Line/Drains/Airways     Name Placement date Placement time Site Days   Peripheral IV 01/05/24 20 G Left Antecubital 01/05/24  --  Antecubital  less than 1   Ureteral Drain/Stent Left ureter 5 Fr. 05/16/23  1049  Left ureter  234   Ureteral Drain/Stent Right ureter 5 Fr. 05/16/23  1051  Right ureter  234            Intake/Output Last 24 hours  Intake/Output Summary (Last 24 hours) at 01/05/2024 1645 Last data filed at 01/05/2024 0734 Gross per 24 hour  Intake 500 ml  Output 0 ml  Net 500 ml    Labs/Imaging Results for orders placed or performed during the hospital encounter of 01/05/24 (from the past 48 hours)  CBC with Differential     Status: None   Collection Time: 01/05/24  2:56 AM  Result Value Ref Range   WBC 7.8 4.0 - 10.5 K/uL   RBC 5.51 4.22 - 5.81 MIL/uL   Hemoglobin 16.0 13.0 - 17.0 g/dL   HCT 50.9 60.9 - 47.9 %   MCV 88.9 80.0 - 100.0 fL   MCH 29.0 26.0 - 34.0 pg   MCHC 32.7 30.0 - 36.0 g/dL   RDW 86.4 88.4 - 84.4 %   Platelets 171 150 - 400 K/uL   nRBC 0.0 0.0 - 0.2 %   Neutrophils Relative % 73 %   Neutro Abs 5.8 1.7 - 7.7 K/uL   Lymphocytes Relative 12 %   Lymphs Abs 0.9 0.7 - 4.0 K/uL   Monocytes Relative 13 %   Monocytes Absolute 1.0 0.1 - 1.0 K/uL   Eosinophils Relative 1 %   Eosinophils Absolute 0.1 0.0 - 0.5 K/uL   Basophils Relative 1 %   Basophils Absolute 0.1 0.0 - 0.1 K/uL   Immature Granulocytes 0 %   Abs  Immature Granulocytes 0.03 0.00 - 0.07 K/uL    Comment: Performed at [facility], [address] [place], [place] [address]  Comprehensive metabolic panel     Status: Abnormal   Collection Time: 01/05/24  2:56 AM  Result Value Ref Range   Sodium 138 135 - 145 mmol/L   Potassium 3.9 3.5 - 5.1 mmol/L   Chloride 106 98 - 111 mmol/L   CO2 20 (L) 22 - 32 mmol/L   Glucose, Bld 102 (H) 70 - 99 mg/dL    Comment: Glucose reference range applies only to samples taken after fasting for at least 8 hours.   BUN 15 8 - 23 mg/dL   Creatinine, Ser 8.49 (H) 0.61 - 1.24 mg/dL   Calcium  8.7 (L) 8.9 - 10.3 mg/dL   Total Protein 6.6 6.5 - 8.1 g/dL   Albumin 3.5 3.5 - 5.0 g/dL   AST 27 15 - 41 U/L   ALT 21 0 - 44 U/L   Alkaline Phosphatase 62 38 - 126 U/L   Total Bilirubin 1.2 0.0 - 1.2 mg/dL   GFR, Estimated 48 (L) >60 mL/min    Comment: (NOTE) Calculated using the CKD-EPI Creatinine Equation (2021)    Anion gap 12 5 - 15    Comment: Performed at [facility], [address] [place], [place] [address]  Protime-INR     Status: None   Collection Time: 01/05/24  2:56 AM  Result Value Ref Range   Prothrombin Time 15.1 11.4 - 15.2 seconds   INR 1.2 0.8 - 1.2    Comment: (NOTE) INR goal varies based on device and disease states. Performed at [facility], [address], [place], [place] [address]   Urinalysis, w/ Reflex to Culture (Infection Suspected) -Urine, Clean Catch     Status: Abnormal   Collection Time: 01/05/24  4:05 AM  Result Value Ref Range   Specimen Source URINE, CLEAN CATCH    Color, Urine  YELLOW YELLOW   APPearance CLEAR CLEAR   Specific Gravity, Urine 1.018 1.005 - 1.030   pH 6.0 5.0 - 8.0   Glucose, UA NEGATIVE NEGATIVE mg/dL   Hgb urine dipstick SMALL (A) NEGATIVE   Bilirubin Urine NEGATIVE NEGATIVE   Ketones, ur 20 (A) NEGATIVE mg/dL   Protein, ur 30 (A) NEGATIVE mg/dL   Nitrite NEGATIVE NEGATIVE   Leukocytes,Ua NEGATIVE NEGATIVE   RBC / HPF 0-5 0 - 5  RBC/hpf   WBC, UA 0-5 0 - 5 WBC/hpf    Comment:        Reflex urine culture not performed if WBC <=10, OR if Squamous epithelial cells >5. If Squamous epithelial cells >5 suggest recollection.    Bacteria, UA NONE SEEN NONE SEEN   Squamous Epithelial / HPF 0-5 0 - 5 /HPF   Mucus PRESENT     Comment: Performed at [facility], [address] [place], [place] [address]  Troponin I (High Sensitivity)     Status: Abnormal   Collection Time: 01/05/24  4:35 AM  Result Value Ref Range   Troponin I (High Sensitivity) 20 (H) <18 ng/L    Comment: (NOTE) Elevated high sensitivity troponin I (hsTnI) values and significant  changes across serial measurements may suggest ACS but many other  chronic and acute conditions are known to elevate hsTnI results.  Refer to the Links section for chest pain algorithms and additional  guidance. Performed at [facility], [address] [place], [place] [address]   Magnesium     Status: None   Collection Time: 01/05/24  4:35 AM  Result Value Ref Range   Magnesium 1.9 1.7 - 2.4 mg/dL    Comment: Performed at [facility], [address], [place], [place] [address]  Troponin I (High Sensitivity)     Status: Abnormal   Collection Time: 01/05/24  6:34 AM  Result Value Ref Range   Troponin I (High Sensitivity) 26 (H) <18 ng/L    Comment: (NOTE) Elevated high sensitivity troponin I (hsTnI) values and significant  changes across serial measurements may suggest ACS but many other  chronic and acute conditions are known to elevate hsTnI results.  Refer to the Links section for chest pain algorithms and additional  guidance. Performed at [facility], [address], [place], [place] [address]   Brain natriuretic peptide     Status: None   Collection Time: 01/05/24  6:34 AM  Result Value Ref Range   B Natriuretic Peptide 58.3 0.0 - 100.0 pg/mL    Comment: Performed at [facility], [address] [place], [place]  [address]   CT Angio Chest PE W and/or Wo Contrast Result Date: 01/05/2024 CLINICAL DATA:  Fall with near syncope.  Weakness and confusion. EXAM: CT ANGIOGRAPHY CHEST CT ABDOMEN AND PELVIS WITH CONTRAST TECHNIQUE: Multidetector CT imaging of the chest was performed using the standard protocol during bolus administration of intravenous contrast. Multiplanar CT image reconstructions and MIPs were obtained to evaluate the vascular anatomy. Multidetector CT imaging of the abdomen and pelvis was performed using the standard protocol during bolus administration of intravenous contrast. RADIATION DOSE REDUCTION: This exam was performed according to the departmental dose-optimization program which includes automated exposure control, adjustment of the mA and/or kV according to patient size and/or use of iterative reconstruction technique. CONTRAST:  75mL OMNIPAQUE  IOHEXOL  350 MG/ML SOLN COMPARISON:  Abdomen/pelvis CT 11/18/2022.  Chest CTA 12/23/2021. FINDINGS: CTA CHEST FINDINGS Cardiovascular: The  heart size is normal. No substantial pericardial effusion. Coronary artery calcification is evident. Mild atherosclerotic calcification is noted in the wall of the thoracic aorta. There is no filling defect within the opacified pulmonary arteries to suggest the presence of an acute pulmonary embolus. Mediastinum/Nodes: No mediastinal lymphadenopathy. There is no hilar lymphadenopathy. The esophagus has normal imaging features. Tiny hiatal hernia noted. There is no axillary lymphadenopathy. Lungs/Pleura: Calcified granuloma noted left upper lobe. Tiny noncalcified right middle lobe pulmonary nodules measure less than 4 mm and are stable since 2022 consistent with benign etiology. No followup imaging is recommended. No focal airspace consolidation. There is no evidence of pleural effusion. Musculoskeletal: No worrisome lytic or sclerotic osseous abnormality. Review of the MIP images confirms the above findings. CT ABDOMEN and PELVIS  FINDINGS Hepatobiliary: Scattered tiny hypodensities in the liver parenchyma are too small to characterize but are stable in the interval and statistically most likely benign. No followup imaging is recommended. There is no evidence for gallstones, gallbladder wall thickening, or pericholecystic fluid. No intrahepatic or extrahepatic biliary dilation. Pancreas: No focal mass lesion. No dilatation of the main duct. No intraparenchymal cyst. No peripancreatic edema. Spleen: No splenomegaly. No suspicious focal mass lesion. Adrenals/Urinary Tract: No adrenal nodule or mass. Multiple small nonobstructing stones are noted in both kidneys, similar to prior. No evidence for hydroureter. Distal ureters and portions of the bladder are obscured by beam hardening artifact from right hip replacement. Stomach/Bowel: Tiny hiatal hernia. Stomach otherwise unremarkable. Duodenum is normally positioned as is the ligament of Treitz. No small bowel wall thickening. No small bowel dilatation. The terminal ileum is normal. The appendix is not well visualized, but there is no edema or inflammation in the region of the cecal tip to suggest appendicitis. No gross colonic mass. No colonic wall thickening. A few scattered diverticular seen in the left colon without diverticulitis. Vascular/Lymphatic: There is moderate atherosclerotic calcification of the abdominal aorta without aneurysm. There is no gastrohepatic or hepatoduodenal ligament lymphadenopathy. No retroperitoneal or mesenteric lymphadenopathy. No pelvic sidewall lymphadenopathy. Reproductive: Prostatectomy. Other: No intraperitoneal free fluid. Musculoskeletal: Right hip replacement. Lumbosacral fusion hardware evident. Review of the MIP images confirms the above findings. IMPRESSION: 1. No CT evidence for acute pulmonary embolus. 2. No acute findings in the chest, abdomen, or pelvis. 3. Tiny hiatal hernia. 4. Bilateral nonobstructing nephrolithiasis. 5.  Aortic Atherosclerosis  (ICD10-I70.0). Electronically Signed   By: Camellia Candle M.D.   On: 01/05/2024 05:50   CT ABDOMEN PELVIS W CONTRAST Result Date: 01/05/2024 CLINICAL DATA:  Fall with near syncope.  Weakness and confusion. EXAM: CT ANGIOGRAPHY CHEST CT ABDOMEN AND PELVIS WITH CONTRAST TECHNIQUE: Multidetector CT imaging of the chest was performed using the standard protocol during bolus administration of intravenous contrast. Multiplanar CT image reconstructions and MIPs were obtained to evaluate the vascular anatomy. Multidetector CT imaging of the abdomen and pelvis was performed using the standard protocol during bolus administration of intravenous contrast. RADIATION DOSE REDUCTION: This exam was performed according to the departmental dose-optimization program which includes automated exposure control, adjustment of the mA and/or kV according to patient size and/or use of iterative reconstruction technique. CONTRAST:  75mL OMNIPAQUE  IOHEXOL  350 MG/ML SOLN COMPARISON:  Abdomen/pelvis CT 11/18/2022.  Chest CTA 12/23/2021. FINDINGS: CTA CHEST FINDINGS Cardiovascular: The heart size is normal. No substantial pericardial effusion. Coronary artery calcification is evident. Mild atherosclerotic calcification is noted in the wall of the thoracic aorta. There is no filling defect within the opacified pulmonary arteries to suggest the presence of  an acute pulmonary embolus. Mediastinum/Nodes: No mediastinal lymphadenopathy. There is no hilar lymphadenopathy. The esophagus has normal imaging features. Tiny hiatal hernia noted. There is no axillary lymphadenopathy. Lungs/Pleura: Calcified granuloma noted left upper lobe. Tiny noncalcified right middle lobe pulmonary nodules measure less than 4 mm and are stable since 2022 consistent with benign etiology. No followup imaging is recommended. No focal airspace consolidation. There is no evidence of pleural effusion. Musculoskeletal: No worrisome lytic or sclerotic osseous abnormality. Review  of the MIP images confirms the above findings. CT ABDOMEN and PELVIS FINDINGS Hepatobiliary: Scattered tiny hypodensities in the liver parenchyma are too small to characterize but are stable in the interval and statistically most likely benign. No followup imaging is recommended. There is no evidence for gallstones, gallbladder wall thickening, or pericholecystic fluid. No intrahepatic or extrahepatic biliary dilation. Pancreas: No focal mass lesion. No dilatation of the main duct. No intraparenchymal cyst. No peripancreatic edema. Spleen: No splenomegaly. No suspicious focal mass lesion. Adrenals/Urinary Tract: No adrenal nodule or mass. Multiple small nonobstructing stones are noted in both kidneys, similar to prior. No evidence for hydroureter. Distal ureters and portions of the bladder are obscured by beam hardening artifact from right hip replacement. Stomach/Bowel: Tiny hiatal hernia. Stomach otherwise unremarkable. Duodenum is normally positioned as is the ligament of Treitz. No small bowel wall thickening. No small bowel dilatation. The terminal ileum is normal. The appendix is not well visualized, but there is no edema or inflammation in the region of the cecal tip to suggest appendicitis. No gross colonic mass. No colonic wall thickening. A few scattered diverticular seen in the left colon without diverticulitis. Vascular/Lymphatic: There is moderate atherosclerotic calcification of the abdominal aorta without aneurysm. There is no gastrohepatic or hepatoduodenal ligament lymphadenopathy. No retroperitoneal or mesenteric lymphadenopathy. No pelvic sidewall lymphadenopathy. Reproductive: Prostatectomy. Other: No intraperitoneal free fluid. Musculoskeletal: Right hip replacement. Lumbosacral fusion hardware evident. Review of the MIP images confirms the above findings. IMPRESSION: 1. No CT evidence for acute pulmonary embolus. 2. No acute findings in the chest, abdomen, or pelvis. 3. Tiny hiatal hernia. 4.  Bilateral nonobstructing nephrolithiasis. 5.  Aortic Atherosclerosis (ICD10-I70.0). Electronically Signed   By: Camellia Candle M.D.   On: 01/05/2024 05:50   CT Head Wo Contrast Result Date: 01/05/2024 CLINICAL DATA:  Head trauma, minor (Age >= 65y); Neck trauma (Age >= 65y) EXAM: CT HEAD WITHOUT CONTRAST CT CERVICAL SPINE WITHOUT CONTRAST TECHNIQUE: Multidetector CT imaging of the head and cervical spine was performed following the standard protocol without intravenous contrast. Multiplanar CT image reconstructions of the cervical spine were also generated. RADIATION DOSE REDUCTION: This exam was performed according to the departmental dose-optimization program which includes automated exposure control, adjustment of the mA and/or kV according to patient size and/or use of iterative reconstruction technique. COMPARISON:  CT head and CT cervical spine November 11, 2022. FINDINGS: CT HEAD FINDINGS Brain: No evidence of acute large vascular territory infarction, hemorrhage, hydrocephalus, extra-axial collection or mass lesion/mass effect. Patchy white matter hypodensities are nonspecific but compatible with chronic microvascular ischemic change. Cerebral atrophy. Vascular: Calcific atherosclerosis. No hyperdense vessel identified. Skull: No acute fracture. Sinuses/Orbits: Pansinus mucosal thickening with near total opacification of the ethmoid air cells. Other: No mastoid effusions. CT CERVICAL SPINE FINDINGS Alignment: Normal. Skull base and vertebrae: Vertebral body heights are maintained. No acute fracture. Soft tissues and spinal canal: No prevertebral fluid or swelling. No visible canal hematoma. Disc levels: Moderate degenerative disc disease at C5-C6. Otherwise, mild bony degenerative changes. Upper chest: Visualized  lung apices are clear. IMPRESSION: 1. No evidence of acute abnormality intracranially or in the cervical spine. 2. Chronic pansinusitis. Electronically Signed   By: Gilmore GORMAN Molt M.D.   On:  01/05/2024 03:14   CT Cervical Spine Wo Contrast Result Date: 01/05/2024 CLINICAL DATA:  Head trauma, minor (Age >= 65y); Neck trauma (Age >= 65y) EXAM: CT HEAD WITHOUT CONTRAST CT CERVICAL SPINE WITHOUT CONTRAST TECHNIQUE: Multidetector CT imaging of the head and cervical spine was performed following the standard protocol without intravenous contrast. Multiplanar CT image reconstructions of the cervical spine were also generated. RADIATION DOSE REDUCTION: This exam was performed according to the departmental dose-optimization program which includes automated exposure control, adjustment of the mA and/or kV according to patient size and/or use of iterative reconstruction technique. COMPARISON:  CT head and CT cervical spine November 11, 2022. FINDINGS: CT HEAD FINDINGS Brain: No evidence of acute large vascular territory infarction, hemorrhage, hydrocephalus, extra-axial collection or mass lesion/mass effect. Patchy white matter hypodensities are nonspecific but compatible with chronic microvascular ischemic change. Cerebral atrophy. Vascular: Calcific atherosclerosis. No hyperdense vessel identified. Skull: No acute fracture. Sinuses/Orbits: Pansinus mucosal thickening with near total opacification of the ethmoid air cells. Other: No mastoid effusions. CT CERVICAL SPINE FINDINGS Alignment: Normal. Skull base and vertebrae: Vertebral body heights are maintained. No acute fracture. Soft tissues and spinal canal: No prevertebral fluid or swelling. No visible canal hematoma. Disc levels: Moderate degenerative disc disease at C5-C6. Otherwise, mild bony degenerative changes. Upper chest: Visualized lung apices are clear. IMPRESSION: 1. No evidence of acute abnormality intracranially or in the cervical spine. 2. Chronic pansinusitis. Electronically Signed   By: Gilmore GORMAN Molt M.D.   On: 01/05/2024 03:14   DG Chest Port 1 View Result Date: 01/05/2024 CLINICAL DATA:  Trauma EXAM: PORTABLE CHEST 1 VIEW COMPARISON:   Chest x-ray 09/16/2022. FINDINGS: The heart size and mediastinal contours are within normal limits. Both lungs are clear. No visible pleural effusions or pneumothorax. No acute osseous abnormality. IMPRESSION: No active disease. Electronically Signed   By: Gilmore GORMAN Molt M.D.   On: 01/05/2024 03:03   DG Pelvis Portable Result Date: 01/05/2024 CLINICAL DATA:  Fall EXAM: PORTABLE PELVIS 1-2 VIEWS COMPARISON:  11/11/2022 FINDINGS: Prior right hip replacement. Postoperative changes in the lower lumbar spine. No acute fracture, subluxation or dislocation. IMPRESSION: No acute bony abnormality. Electronically Signed   By: Franky Crease M.D.   On: 01/05/2024 02:55    Pending Labs Unresulted Labs (From admission, onward)     Start     Ordered   01/05/24 1124  Phosphorus  Once,   R        01/05/24 1123   01/05/24 1123  TSH  Once,   R        01/05/24 1123   01/05/24 1123  VITAMIN D  25 Hydroxy (Vit-D Deficiency, Fractures)  Once,   R        01/05/24 1123   01/05/24 1123  Vitamin B12  Once,   R        01/05/24 1123            Vitals/Pain Today's Vitals   01/05/24 1100 01/05/24 1130 01/05/24 1145 01/05/24 1200  BP:  (!) 184/80 (!) 170/77 (!) 125/57  Pulse: 79 63 74 66  Resp: 19 18 20 18   Temp: 98.4 F (36.9 C)     TempSrc:      SpO2: 96% 98% 99% 99%  Weight:      Height:  PainSc:        Isolation Precautions No active isolations  Medications Medications  acetaminophen  (TYLENOL ) tablet 650 mg (has no administration in time range)    Or  acetaminophen  (TYLENOL ) suppository 650 mg (has no administration in time range)  senna-docusate (Senokot-S) tablet 1 tablet (has no administration in time range)  ondansetron  (ZOFRAN ) tablet 4 mg (has no administration in time range)    Or  ondansetron  (ZOFRAN ) injection 4 mg (has no administration in time range)  apixaban  (ELIQUIS ) tablet 2.5 mg (2.5 mg Oral Given 01/05/24 1305)  atorvastatin  (LIPITOR) tablet 20 mg (has no administration in  time range)  carvedilol  (COREG ) tablet 25 mg (25 mg Oral Given 01/05/24 1629)  cholecalciferol  (VITAMIN D3) 25 MCG (1000 UNIT) tablet 2,000 Units (2,000 Units Oral Given 01/05/24 1305)  fluticasone  (FLONASE ) 50 MCG/ACT nasal spray 1-2 spray (has no administration in time range)  furosemide  (LASIX ) tablet 20 mg (has no administration in time range)  multivitamin with minerals tablet 1 tablet (1 tablet Oral Given 01/05/24 1305)  pantoprazole  (PROTONIX ) EC tablet 40 mg (40 mg Oral Given 01/05/24 1629)  potassium citrate  (UROCIT-K ) SR tablet 10 mEq (10 mEq Oral Given 01/05/24 1305)  oxyCODONE -acetaminophen  (PERCOCET/ROXICET) 5-325 MG per tablet 1 tablet (has no administration in time range)  lidocaine  (LIDODERM ) 5 % 1 patch (1 patch Transdermal Patch Applied 01/05/24 1629)  iohexol  (OMNIPAQUE ) 350 MG/ML injection 75 mL (75 mLs Intravenous Contrast Given 01/05/24 0522)  lactated ringers  bolus 500 mL (0 mLs Intravenous Stopped 01/05/24 0734)  acetaminophen  (TYLENOL ) tablet 1,000 mg (1,000 mg Oral Given 01/05/24 0626)  HYDROmorphone  (DILAUDID ) tablet 2 mg (2 mg Oral Given 01/05/24 0735)    Mobility non-ambulatory     Focused Assessments Neuro Assessment Handoff:  Swallow screen pass? Yes  Cardiac Rhythm: Normal sinus rhythm       Neuro Assessment: Within Defined Limits Neuro Checks:      Has TPA been given? No If patient is a Neuro Trauma and patient is going to OR before floor call report to 4N Charge nurse: [phone] or [phone]   R Recommendations: See Admitting Provider Note  Report given to:   Additional Notes: Pt had increased confusion as day went on. Calm and cooperative

## 2024-01-05 NOTE — Progress Notes (Addendum)
 Informed by RN that patient had a change in mental status and had been becoming more drowsy and unresponsive. Evaluated patient at the bedside. Patient with eyes closed but able to open on command. He does not hear well on his right side. He is more drowsy compared to earlier this morning but able to follow simple commands. No focal neuro deficits on my exam however due to the change in mental status, elevated BP with SBP in the 170s and 180s as well as his recent fall on Eliquis , plan to get repeat CT for further evaluation. -Repeat CTA head -Hold any sedating meds, discontinue as needed Percocet -IV hydralazine  10 mg Q6h PRN for SBP >180 -Delirium precautions  Addendum 9:15 PM Repeat CT head does not show any acute intracranial abnormality Patient spiked fever of 101.4 earlier in the evening.  Current temp is 98.4.  Full workup today including UA, chest x-ray, CT head, CT A/P, CT chest PE study and CBC did not reveal any signs of infection. He will need repeat infectious workup with repeat labs including blood cultures and initiation of broad-spectrum antibiotics if he continues to spike fevers.

## 2024-01-05 NOTE — Progress Notes (Signed)
 Orthopedic Tech Progress Note Patient Details:  Bruce Ball 1946/05/13 [id number]  Patient ID: Bruce Ball, male   DOB: [birth year], 78 y.o.   MRN: [id number] I attended trauma page [place] [facility] 01/05/2024, 6:52 AM

## 2024-01-05 NOTE — H&P (Signed)
 History and Physical    Patient: Bruce KEEVEN Ball DOB: Nov 04, 1946 DOA: 01/05/2024 DOS: the patient was seen and examined on 01/05/2024 PCP: Katrinka Garnette KIDD, MD  Patient coming from: Home  Chief Complaint:  Chief Complaint  Patient presents with   Fall   HPI: Bruce Ball is a 78 y.o. male with medical history significant of recurrent falls, CAD, GERD, HTN, PE/DVT on Eliquis , prostate cancer, bone cancer, HLD, osteoarthritis, PACs and CKD stage IIIA who presented to the ED after a fall at home. Per spouse, patient had some trouble walking due to worsening of his chronic right hip pain and weakness yesterday evening and required assistant to get into bed yesterday. They found him next to his bed this morning. Patient states he does not remember falling out of bed but remembers being on the floor. Per spouse, patient's daughter states she noticed patient had a near syncope while they were waiting for 911. He denies any dizziness, chest pain, palpitations, shortness of breath, abdominal pain, nausea, vomiting, fevers or chills. He does endorse feeling a little confused and weak.  He also endorsed right hip pain that is chronic. He had bone cancer at a young age and required right hip prosthetic in his 57s but recently required revision by his orthopedic surgeon at [facility].  ED course: Initial vitals with temp 99, RR 19, HR 84, BP 180/80, SpO2 97% on room air Labs show normal CBC, sodium 138, K+ 3.9, bicarb 20, creatinine 1.50, PT/INR 15.1/1.2, UA with mild hemoglobinuria ketonuria and proteinuria but no signs of infection, troponin 20->26, mag 1.9, BNP 58.3. EKG shows normal sinus rhythm with no evidence of arrhythmia Chest x-ray with no acute disease X-ray of the pelvis with no acute bony abnormality CT head with no acute intracranial abnormality but shows chronic pansinusitis CT cervical spine with no acute cervical spine pathology CTA chest PE study negative for acute PE CT A/P  with no acute abnormality in the abdomen or pelvis Received 500 cc IV LR bolus and Dilaudid  2 mg tab x 1 TRH was consulted for admission  Review of Systems: As mentioned in the history of present illness. All other systems reviewed and are negative. Past Medical History:  Diagnosis Date   Anemia associated with chronic renal failure    At high risk for falls    due to gait disorders;   recurrent falls   (05-12-2023  per pt last fall 2 weeks ago)   Chronic anticoagulation    eliquis --- managed by pcp   Coronary artery calcification seen on CAT scan    followed by dr t. turner---   05-31-2021  calcium  score= 152   Deafness in right ear    ED (erectile dysfunction)    Edema of both lower extremities    Essential tremor    Gait disorder    imbalance when walking uses cane/ walker;  recurrent falls   GERD (gastroesophageal reflux disease)    Hematuria    Hiatal hernia    History of adenomatous polyp of colon    History of agent Orange exposure    History of basal cell carcinoma (BCC) excision    History of DVT of lower extremity 12/2019   admission in epic ;   w/ PE's all lobes   History of esophageal dilatation 12/2019   dr leigh   History of kidney stones    History of peptic ulcer 2009   History of prostate cancer 12/2001   followed by dr  manny;   first dx 01/ 2003,  Gleason 3+3,   03-10-2002  s/p  radical prostatectomy pelvic lymph node disections , no recurrence   History of pulmonary embolus (PE) 12/2019   admission in epic;   submassive all lobes and dvt -lle   History of sarcoma of bone 1970   orthopedic oncologist--- dr w. 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 turner;  monitor in epic 04-08-2023 low PVC burden and atrial ectopy   Renal calculi    bilateral   Stage 3 chronic kidney disease [facility])    nephrologist--- dr dolan   Thyroid  nodule 2016   endocrinologist--- dr charlane  [facility])  hx benign bx, right side   Urinary incontinence    total incontinence   Wears hearing aid in left ear    Past Surgical History:  Procedure Laterality Date   CYSTOSCOPY WITH RETROGRADE PYELOGRAM, URETEROSCOPY AND STENT PLACEMENT Right 11/06/2017   Procedure: CYSTOSCOPY WITH  STENT PLACEMENT RIGHT;  Surgeon: Renda Glance, MD;  Location: [facility];  Service: Urology;  Laterality: Right;   CYSTOSCOPY WITH RETROGRADE PYELOGRAM, URETEROSCOPY AND STENT PLACEMENT Left 06/09/2019   Procedure: CYSTOSCOPY WITH RETROGRADE PYELOGRAM, URETEROSCOPY AND STENT PLACEMENT X2;  Surgeon: Alvaro Hummer, MD;  Location: [facility];  Service: Urology;  Laterality: Left;   CYSTOSCOPY WITH RETROGRADE PYELOGRAM, URETEROSCOPY AND STENT PLACEMENT Bilateral 03/01/2020   Procedure: CYSTOSCOPY WITH RETROGRADE PYELOGRAM, URETEROSCOPY AND STENT PLACEMENT;  Surgeon: Alvaro Hummer, MD;  Location: [facility];  Service: Urology;  Laterality: Bilateral;  75 MINS   CYSTOSCOPY WITH RETROGRADE PYELOGRAM, URETEROSCOPY AND STENT PLACEMENT Bilateral 05/16/2023   Procedure: CYSTOSCOPY WITH RETROGRADE PYELOGRAM, URETEROSCOPY AND STENT PLACEMENT;  Surgeon: Alvaro Hummer KATHEE Mickey., MD;  Location: [facility];  Service: Urology;  Laterality: Bilateral;  90 MINS   CYSTOSCOPY/RETROGRADE/URETEROSCOPY  2020   at [facility] in [place]   CYSTOSCOPY/URETEROSCOPY/HOLMIUM LASER Right 11/24/2017   Procedure: CYSTOSCOPY/URETEROSCOPY/ RETROGRADE/STENT REMOVAL;  Surgeon: Renda Glance, MD;  Location:  [facility];  Service: Urology;  Laterality: Right;   FEMORAL OSTEOTOMY W/ RODDING  1970   right proximal femUr resection replacement w/ custom prosthesis   FEMUR FRACTURE SURGERY Right 10/17/2021   @ [facility] by dr w. eward;  REVISION RIGHT PROXIMAL FEMUR REPLACEMENT IN SETTING OF DISTAL PERIPROSTHETIC FEMUR FRACTURE, BOTH COMPONENTS   FUNCTIONAL ENDOSCOPIC SINUS SURGERY  03/19/2023   @ [facility]   HOLMIUM LASER APPLICATION Bilateral 03/01/2020   Procedure: HOLMIUM LASER APPLICATION;  Surgeon: Alvaro Hummer, MD;  Location: [facility];  Service: Urology;  Laterality: Bilateral;   HOLMIUM LASER APPLICATION Bilateral 05/16/2023   Procedure: HOLMIUM LASER APPLICATION;  Surgeon: Alvaro Hummer KATHEE Mickey., MD;  Location: [facility];  Service: Urology;  Laterality: Bilateral;   LUMBAR SPINE SURGERY  07/2017   @ [facility]  by dr alix;    L5-- S1  laminectomy   POSTERIOR LUMBAR FUSION  06/28/2020   @ [facility]  in [place];   L5--S1   PROSTATECTOMY  03/10/2002   @[facility]  by dr mardy;    w/ pelvic lymph node dissection's   SPINAL CORD STIMULATOR REMOVAL  04/15/2016   W/   THORACIC PARTIAL LAMINECTOMY T9   Social History:  reports that he quit smoking about 45 years ago. His smoking use included cigarettes. He started smoking about 62 years ago. He has a 20 pack-year smoking history. He has never used smokeless tobacco. He reports that he does not drink alcohol and does not use drugs.  Allergies  Allergen Reactions   Tape Other (See Comments)    NEEDS COBAN WRAP- NO TAPE!!!!- SKIN TEARS VERY EASILY!!!!   Erythromycin Nausea And Vomiting   Lisinopril Other (See Comments)    Lowered the blood pressure TOO MUCH!! All ACE Inhibitors per wife at bedside    Family History  Problem Relation Age of Onset   COPD Father    Hypertension Father    Prostate cancer Brother    Stomach cancer Brother        on chemo in 2022- in 61s developed   Depression Brother        suicide after  brain stem injury after accident   Stroke Brother        90 years old   Esophageal cancer Neg Hx    Pancreatic cancer Neg Hx    Colon cancer Neg Hx     Prior to Admission medications   Medication Sig Start Date End Date Taking? Authorizing Provider  acetaminophen  (TYLENOL ) 500 MG tablet Take 2 tablets (1,000 mg total) by mouth every 6 (six) hours as needed for headache, fever, moderate pain (pain score 4-6) or mild pain (pain score 1-3). 01/05/24  Yes Mesner, Selinda, MD  apixaban  (ELIQUIS ) 2.5 MG TABS tablet Take 1 tablet (2.5 mg total) by mouth 2 (two) times daily. 01/25/21  Yes Gorsuch, Ni, MD  atorvastatin  (LIPITOR) 20 MG tablet Take 1 tablet (20 mg total) by mouth at bedtime. Patient taking differently: Take 20 mg by mouth at bedtime. 07/19/21  Yes Turner, Wilbert SAUNDERS, MD  carvedilol  (COREG ) 25 MG tablet Take 25 mg by mouth 2 (two) times daily with a meal.   Yes [provider]  cetirizine (ZYRTEC) 10 MG tablet Take 10 mg by mouth as needed for allergies.   Yes [provider]  Cholecalciferol  (VITAMIN D3) 50 MCG (2000 UT) capsule Take 2,000 Units by mouth in the morning and at bedtime.    Yes [provider]  fluticasone  (FLONASE ) 50 MCG/ACT nasal spray Place 1-2 sprays into both nostrils daily as needed for allergies or rhinitis.   Yes [provider]  furosemide  (LASIX ) 20 MG tablet Take 20 mg by mouth daily as needed for fluid (if the feet and/or legs swell).   Yes [provider]  HYDROmorphone  (DILAUDID ) 4 MG tablet Take 0.5 tablets (2 mg total) by mouth every 12 (twelve) hours as needed for severe pain (pain score 7-10). 01/05/24  Yes Mesner, Selinda, MD  methocarbamol  (ROBAXIN ) 500 MG tablet Take 1 tablet (500 mg total) by mouth every 8 (eight) hours as needed for muscle spasms. 01/05/24  Yes Mesner, Selinda, MD  Multiple Vitamin (MULTIVITAMIN) capsule Take 1 capsule by mouth daily.   Yes [provider]  pantoprazole  (PROTONIX ) 40 MG tablet Take  40 mg by mouth 2 (two) times daily before a meal.   Yes [provider]  potassium citrate  (UROCIT-K ) 10 MEQ (1080 MG) SR tablet  Take 1 tablet (10 mEq total) by mouth as directed. 10/23/23  Yes Jodie Lavern CROME, MD    Physical Exam: Vitals:   01/05/24 0615 01/05/24 0632 01/05/24 0645 01/05/24 0800  BP: (!) 185/77  (!) 176/93 (!) 169/73  Pulse: 89  79 76  Resp: 19  16 19   Temp:  98.6 F (37 C)    TempSrc:  Oral    SpO2: 94%  95% 93%  Weight:      Height:       General: Pleasant, chronically ill-appearing elderly man laying in bed. No acute distress. HEENT: [place]/AT. Anicteric sclera. Dry mucous membrane. CV: RRR. No murmurs, rubs, or gallops. Trace BLE edema Pulmonary: Lungs CTAB. Normal effort. No wheezing or rales. Abdominal: Soft, nontender, nondistended. Normal bowel sounds. Extremities: Mild tenderness to palpation of the right hip and BLE. Palpable pedal pulses. Skin: Warm and dry. No obvious rash or lesions. Neuro: Alert and oriented to self, person and place but not to time. Moves all extremities. Normal sensation to light touch. No focal deficit. Psych: Normal mood and affect  Data Reviewed: Labs show normal CBC, sodium 138, K+ 3.9, bicarb 20, creatinine 1.50, PT/INR 15.1/1.2, UA with mild hemoglobinuria ketonuria and proteinuria but no signs of infection, troponin 20->26, mag 1.9, BNP 58.3. EKG shows sinus rhythm with no evidence of arrhythmia Chest x-ray with no acute disease X-ray of the pelvis with no acute bony abnormality CT head with no acute intracranial abnormality but shows chronic pansinusitis CT cervical spine with no acute cervical spine pathology CTA chest PE study negative for acute PE CT A/P with no acute abnormality in the abdomen or pelvis  Assessment and Plan:  Bruce Ball is a 78 y.o. male with medical history significant of recurrent falls, CAD, GERD, HTN, PE/DVT on Eliquis , prostate cancer, bone cancer, HLD, osteoarthritis, PACs and CKD  stage IIIA who presented to the ED after a fall at home and admitted for observation.  # Fall # Chronic right hip pain Elderly patient with history of recurrent falls in the setting of chronic right hip pain presented after an unwitnessed fall from his bed at home. Trauma scan overall negative. He denied any dizziness, palpitation or chest pain prior to the fall. This is likely mechanical fall similar to his previous episodes. -Admit to med telemetry bed for observation -PT/OT eval and treat -Fall precautions -Check vitamin D , TSH, Phos and vitamin B-12 levels -Apply lidocaine  patch to right hip -As needed Percocet for moderate to severe pain -Continue multivitamin and vitamin D  supplements -Follow-up up with orthopedic surgery at [facility] in the outpatient  # Hx of DVT/PE Denies any chest pain or shortness of breath.  He has chronic bilateral lower extremity edema. -Continue Eliquis  -Continue as needed Lasix  -Apply TED hose  # HTN BP elevated with SBP initially in the 160s to 180s but improved to the 120s. -Continue on Coreg   # CKD 3A Baseline creatinine around 1.4-1.5. Creatinine of 1.5 around baseline -Avoid nephrotoxic agents  # Hx of PACs Long-term monitor earlier this year showed patient had frequent atrial ectopy but no evidence of arrhythmia.  He denies any palpitations, dizziness or chest pain.  EKG on admission shows normal sinus rhythm. -Telemetry -Continue Coreg   # HLD # CAD -Continue atorvastatin   # GERD -Continue Protonix    Advance Care Planning:   Code Status: Full Code   Consults: None  Family Communication: Discussed admission with spouse at bedside  Severity of Illness: The appropriate patient status for  this patient is OBSERVATION. Observation status is judged to be reasonable and necessary in order to provide the required intensity of service to ensure the patient's safety. The patient's presenting symptoms, physical exam findings, and initial  radiographic and laboratory data in the context of their medical condition is felt to place them at decreased risk for further clinical deterioration. Furthermore, it is anticipated that the patient will be medically stable for discharge from the hospital within 2 midnights of admission.   Author: Claretta CHRISTELLA Alderman, MD 01/05/2024 11:20 AM  For on call review [email or web address].

## 2024-01-05 NOTE — ED Notes (Signed)
 Trauma Response Nurse Documentation   Bruce Ball is a 78 y.o. male arriving to [facility] ED via [facility]  On Eliquis  (apixaban ) daily. Trauma was activated as a Level 2 by Ronal Oas RN based on the following trauma criteria Elderly patients > 65 with head trauma on anti-coagulation (excluding ASA).  Patient cleared for CT by Dr. Lorette. Pt transported to CT with trauma response nurse present to monitor. RN remained with the patient throughout their absence from the department for clinical observation.   GCS 14.  Trauma MD Arrival Time: N/A.  History   Past Medical History:  Diagnosis Date   Anemia associated with chronic renal failure    At high risk for falls    due to gait disorders;   recurrent falls   (05-12-2023  per pt last fall 2 weeks ago)   Chronic anticoagulation    eliquis --- managed by pcp   Coronary artery calcification seen on CAT scan    followed by dr t. turner---   05-31-2021  calcium  score= 152   Deafness in right ear    ED (erectile dysfunction)    Edema of both lower extremities    Essential tremor    Gait disorder    imbalance when walking uses cane/ walker;  recurrent falls   GERD (gastroesophageal reflux disease)    Hematuria    Hiatal hernia    History of adenomatous polyp of colon    History of agent Orange exposure    History of basal cell carcinoma (BCC) excision    History of DVT of lower extremity 12/2019   admission in epic ;   w/ PE's all lobes   History of esophageal dilatation 12/2019   dr leigh   History of kidney stones    History of peptic ulcer 2009   History of prostate cancer 12/2001   followed by dr alvaro;   first dx 01/ 2003,  Gleason 3+3,   03-10-2002  s/p  radical prostatectomy pelvic lymph node disections , no recurrence   History of pulmonary embolus (PE) 12/2019   admission in epic;   submassive all lobes and dvt -lle   History of sarcoma of bone 1970   orthopedic oncologist--- dr w. 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 shlomo;  monitor in epic 04-08-2023 low PVC burden and atrial ectopy   Renal calculi    bilateral   Stage 3 chronic kidney disease [facility])    nephrologist--- dr dolan   Thyroid  nodule 2016   endocrinologist--- dr charlane  [facility])  hx benign bx, right side   Urinary incontinence    total incontinence   Wears hearing aid in left ear      Past Surgical History:  Procedure Laterality Date   CYSTOSCOPY WITH RETROGRADE PYELOGRAM, URETEROSCOPY AND STENT PLACEMENT Right 11/06/2017   Procedure: CYSTOSCOPY WITH  STENT PLACEMENT RIGHT;  Surgeon: Renda Glance, MD;  Location: [facility];  Service: Urology;  Laterality: Right;   CYSTOSCOPY WITH RETROGRADE PYELOGRAM, URETEROSCOPY AND STENT PLACEMENT Left 06/09/2019   Procedure: CYSTOSCOPY WITH RETROGRADE PYELOGRAM, URETEROSCOPY AND STENT PLACEMENT X2;  Surgeon: Alvaro Hummer, MD;  Location: [facility];  Service: Urology;  Laterality: Left;   CYSTOSCOPY WITH RETROGRADE PYELOGRAM, URETEROSCOPY AND STENT PLACEMENT Bilateral 03/01/2020   Procedure: CYSTOSCOPY WITH RETROGRADE PYELOGRAM, URETEROSCOPY AND STENT PLACEMENT;  Surgeon: Alvaro Hummer, MD;  Location: [facility];  Service: Urology;  Laterality: Bilateral;  75 MINS   CYSTOSCOPY WITH RETROGRADE PYELOGRAM, URETEROSCOPY AND STENT PLACEMENT Bilateral 05/16/2023   Procedure: CYSTOSCOPY WITH RETROGRADE PYELOGRAM, URETEROSCOPY AND STENT PLACEMENT;  Surgeon: Alvaro Hummer KATHEE Mickey., MD;  Location: [facility];  Service: Urology;  Laterality: Bilateral;  90 MINS   CYSTOSCOPY/RETROGRADE/URETEROSCOPY  2020   at [facility] in [place]   CYSTOSCOPY/URETEROSCOPY/HOLMIUM LASER Right 11/24/2017   Procedure: CYSTOSCOPY/URETEROSCOPY/ RETROGRADE/STENT REMOVAL;  Surgeon: Renda Glance, MD;  Location: [facility];  Service: Urology;  Laterality: Right;   FEMORAL OSTEOTOMY W/ RODDING  1970   right proximal femUr resection replacement w/ custom prosthesis   FEMUR FRACTURE SURGERY Right 10/17/2021   @ [facility] by dr w. eward;  REVISION RIGHT PROXIMAL FEMUR REPLACEMENT IN SETTING OF DISTAL PERIPROSTHETIC FEMUR FRACTURE, BOTH COMPONENTS   FUNCTIONAL ENDOSCOPIC SINUS SURGERY  03/19/2023   @ [facility]   HOLMIUM LASER APPLICATION Bilateral 03/01/2020   Procedure: HOLMIUM LASER APPLICATION;  Surgeon: Alvaro Hummer, MD;  Location: [facility];  Service: Urology;  Laterality: Bilateral;   HOLMIUM LASER APPLICATION Bilateral 05/16/2023   Procedure: HOLMIUM LASER APPLICATION;  Surgeon: Alvaro Hummer KATHEE Mickey., MD;  Location: [facility];  Service: Urology;  Laterality: Bilateral;   LUMBAR SPINE SURGERY  07/2017   @ [facility]  by dr alix;    L5-- S1  laminectomy   POSTERIOR LUMBAR FUSION  06/28/2020   @ [facility] in [place];   L5--S1   PROSTATECTOMY  03/10/2002   @[facility]  by dr mardy;    w/ pelvic lymph node dissection's   SPINAL CORD STIMULATOR REMOVAL  04/15/2016   W/   THORACIC PARTIAL LAMINECTOMY T9       Initial Focused Assessment (If applicable, or please see trauma documentation): Airway-- intact, no visible obstruction Breathing-- spontaneous, unlabored Circulation-- no obvious bleeding noted  CT's Completed:   CT Head and CT C-Spine   Interventions:  See event summary  Plan for disposition:  Unknown at this time.  Consults completed:  none at 0645.  Event Summary: Patient brought in by [facility]. Patient had  fall out of bed tonight. Family reports patient has been lethargic all day, hx of UTI. Patient on Eliquis  for hx of DVT. Patient transferred from EMS stretcher to hospital bed. Manual BP (160/95) obtained. Blood work obtained. 20G PIV via EMS. Xray chest and pelvis completed. Patient to CT with TRN and Primary RN. CT head and c-spine completed. Patient back to trauma bay at this time.   MTP Summary (If applicable):  N/A  Bedside handoff with ED RN Ronnald.    Bernardino Mayotte  Trauma Response RN  Please call TRN at [phone] for further assistance.

## 2024-01-05 NOTE — ED Notes (Signed)
 Patient transported to CT with RN and remained on monitor during transport.

## 2024-01-06 ENCOUNTER — Observation Stay (HOSPITAL_COMMUNITY): Payer: Medicare Other

## 2024-01-06 DIAGNOSIS — R509 Fever, unspecified: Secondary | ICD-10-CM | POA: Diagnosis not present

## 2024-01-06 DIAGNOSIS — M5417 Radiculopathy, lumbosacral region: Secondary | ICD-10-CM

## 2024-01-06 LAB — COMPREHENSIVE METABOLIC PANEL
ALT: 19 U/L (ref 0–44)
AST: 27 U/L (ref 15–41)
Albumin: 3.4 g/dL — ABNORMAL LOW (ref 3.5–5.0)
Alkaline Phosphatase: 55 U/L (ref 38–126)
Anion gap: 12 (ref 5–15)
BUN: 16 mg/dL (ref 8–23)
CO2: 24 mmol/L (ref 22–32)
Calcium: 8.7 mg/dL — ABNORMAL LOW (ref 8.9–10.3)
Chloride: 101 mmol/L (ref 98–111)
Creatinine, Ser: 1.43 mg/dL — ABNORMAL HIGH (ref 0.61–1.24)
GFR, Estimated: 50 mL/min — ABNORMAL LOW (ref >60)
Glucose, Bld: 104 mg/dL — ABNORMAL HIGH (ref 70–99)
Potassium: 3.5 mmol/L (ref 3.5–5.1)
Sodium: 137 mmol/L (ref 135–145)
Total Bilirubin: 1.3 mg/dL — ABNORMAL HIGH (ref 0.0–1.2)
Total Protein: 6.5 g/dL (ref 6.5–8.1)

## 2024-01-06 LAB — CBC WITH DIFFERENTIAL/PLATELET
Abs Immature Granulocytes: 0.01 10*3/uL (ref 0.00–0.07)
Basophils Absolute: 0 10*3/uL (ref 0.0–0.1)
Basophils Relative: 0 %
Eosinophils Absolute: 0.1 10*3/uL (ref 0.0–0.5)
Eosinophils Relative: 2 %
HCT: 48.3 % (ref 39.0–52.0)
Hemoglobin: 16.2 g/dL (ref 13.0–17.0)
Immature Granulocytes: 0 %
Lymphocytes Relative: 12 %
Lymphs Abs: 0.8 10*3/uL (ref 0.7–4.0)
MCH: 29 pg (ref 26.0–34.0)
MCHC: 33.5 g/dL (ref 30.0–36.0)
MCV: 86.4 fL (ref 80.0–100.0)
Monocytes Absolute: 0.9 10*3/uL (ref 0.1–1.0)
Monocytes Relative: 13 %
Neutro Abs: 4.9 10*3/uL (ref 1.7–7.7)
Neutrophils Relative %: 73 %
Platelets: 171 10*3/uL (ref 150–400)
RBC: 5.59 MIL/uL (ref 4.22–5.81)
RDW: 13.7 % (ref 11.5–15.5)
WBC: 6.7 10*3/uL (ref 4.0–10.5)
nRBC: 0 % (ref 0.0–0.2)

## 2024-01-06 LAB — GLUCOSE, CAPILLARY: Glucose-Capillary: 97 mg/dL (ref 70–99)

## 2024-01-06 MED ORDER — OXYCODONE-ACETAMINOPHEN 5-325 MG PO TABS: 1.0000 | ORAL_TABLET | Freq: Four times a day (QID) | ORAL | Status: DC | PRN

## 2024-01-06 MED ORDER — OXYCODONE-ACETAMINOPHEN 5-325 MG PO TABS
1.0000 | ORAL_TABLET | Freq: Three times a day (TID) | ORAL | Status: DC | PRN
  Administered 2024-01-06: 1 via ORAL
  Filled 2024-01-06 (×2): qty 1

## 2024-01-06 MED ORDER — ACETAMINOPHEN 500 MG PO TABS: 1000.0000 mg | ORAL_TABLET | Freq: Four times a day (QID) | ORAL | Status: DC | PRN

## 2024-01-06 NOTE — Progress Notes (Signed)
 TRH ROUNDING NOTE JEDI CATALFAMO FMW:[id number]  DOB: 05/07/1946  DOA: 01/05/2024  PCP: Katrinka Garnette KIDD, MD  01/06/2024,8:04 AM   LOS: 0 days      Code Status: Full code From: Home  current Dispo: Unclear   78 year old white male Chronic sinus issues Prior pulmonary embolism 12/2019 CKD 3A h/o prostate cancer with resection--previous of osteosarcoma status post surgical resection Lumbar sacral radiculopathy with recurrent falls and periprosthetic femoral shaft fracture operated [facility] [employer] [employer] Dr. Melody from [facility] 09/2021: CAD low risk stress 09/2022 PVCs on Coreg  status post monitor 03/2023 HTN Reflux CKD 3 A  Events 1/6 admit from home unwitnessed fall on ground-found to be encephalopathic-sodium 138 potassium 3.9 BUN/creatinine 15/1.5 (baseline 1.4) 116 WBC 7.8 platelet 171 1/6 PM-more unresponsive fever encephalopathic  procedures 1/6  Pelvis with no fractures, CXR no active disease  CT head no acute abnormality intracranially or cervical spine CT angio chest no pulmonary embolism small hiatal hernia nonobstructive nephrolithiasis  CT abdomen   [-]   Repeat CT head no acute abnormality fever 101.4 1/7 CXR 1 view no active disease   Plan  Accidental fall in the setting of known lumbosacral radiculopathy Fall probably from chronic issues-treat with Tylenol  for mild pain, see below re Percocet Will check orthostatics to rule out orthostasis causing fall although it does not sound like he was standing when he fell it sounds like he fell out of bed Therapy to ensure safety prior to discharge  Toxic metabolic encephalopathy and low grade one-time fever overnight on 1/7 Ruling out infection CXR   [-], white count not elevated--- would not workup further Would space out Percocet given encephalopathy-use only Tylenol --if he requires it and still oriented we can try to resume  CAD/low risk stress 2023, chronic PVCs monitor 03/2023-managed by Dr. Shlomo, Dr. Nancey  LVEDP Continues Coreg  25 twice daily Lasix  20 milligram  Previous DVT PE Continues on apixaban  2.5 twice daily-likely needs lifelong Rx  History previous osteosarcoma operated on at [facility] Has chronic right lower extremity swelling use uses a walker/cane  CKD 3 A Labs seem to be about usual baseline-continues on meds as above  Called wife  [phone] has had several falls prior.  No recent change in meds---expresses concern about some confusion and 'pass out for 15-20 sec' She related that he was emotional and passed out and could not remember anything   DVT prophylaxis: Continues on apixaban   Status is: Observation The patient remains OBS appropriate and will d/c before 2 midnights.   Subjective: Looks well-able to tell me date time year place person and the president Not confused but a little bit slow and sleepy No recurrent confusion since yesterday No recurrent fever   Objective + exam Vitals:   01/05/24 2043 01/05/24 2229 01/06/24 0059 01/06/24 0431  BP: (!) 171/81 (!) 160/73 (!) 158/77 (!) 183/83  Pulse: 91 91 79 72  Resp: 18   18  Temp: 98.4 F (36.9 C) 99.1 F (37.3 C) 98.4 F (36.9 C) 99.4 F (37.4 C)  TempSrc: Oral Oral Oral Oral  SpO2: 97% 97% 97% 95%  Weight:      Height:       Filed Weights   01/05/24 0241  Weight: 114.8 kg    Examination: Awake coherent no distress EOMI NCAT no icterus no pallor Chest is clear no rales rhonchi wheeze S1-S2 no murmur ROM is intact moving 4 limbs He has mild swelling in the right lower extremity no redness this is chronic Abdomen  is soft no rebound Power is 5/5 grossly to exam  Data Reviewed: reviewed   CBC    Component Value Date/Time   WBC 7.8 01/05/2024 0256   RBC 5.51 01/05/2024 0256   HGB 16.0 01/05/2024 0256   HCT 49.0 01/05/2024 0256   PLT 171 01/05/2024 0256   MCV 88.9 01/05/2024 0256   MCH 29.0 01/05/2024 0256   MCHC 32.7 01/05/2024 0256   RDW 13.5 01/05/2024 0256   LYMPHSABS 0.9  01/05/2024 0256   MONOABS 1.0 01/05/2024 0256   EOSABS 0.1 01/05/2024 0256   BASOSABS 0.1 01/05/2024 0256      Latest Ref Rng & Units 01/05/2024    2:56 AM 06/17/2023   12:23 PM 05/16/2023    9:09 AM  CMP  Glucose 70 - 99 mg/dL 897  92  87   BUN 8 - 23 mg/dL 15  23  27    Creatinine 0.61 - 1.24 mg/dL 8.49  8.59  8.59   Sodium 135 - 145 mmol/L 138  141  142   Potassium 3.5 - 5.1 mmol/L 3.9  4.6  4.1   Chloride 98 - 111 mmol/L 106  106  105   CO2 22 - 32 mmol/L 20  28    Calcium  8.9 - 10.3 mg/dL 8.7  8.9    Total Protein 6.5 - 8.1 g/dL 6.6  6.3    Total Bilirubin 0.0 - 1.2 mg/dL 1.2  1.1    Alkaline Phos 38 - 126 U/L 62  60    AST 15 - 41 U/L 27  19    ALT 0 - 44 U/L 21  17      Scheduled Meds:  apixaban   2.5 mg Oral BID   atorvastatin   20 mg Oral QHS   carvedilol   25 mg Oral BID WC   cholecalciferol   2,000 Units Oral BID   lidocaine   1 patch Transdermal Q24H   multivitamin with minerals  1 tablet Oral Daily   pantoprazole   40 mg Oral BID AC   potassium citrate   10 mEq Oral Daily   Continuous Infusions:  Time  45  Jai-Gurmukh Inaya Gillham, MD  [facility]

## 2024-01-06 NOTE — Evaluation (Signed)
 Physical Therapy Evaluation Patient Details Name: Bruce Ball MRN: [id number] DOB: 01-24-1946 Today's Date: 01/06/2024  History of Present Illness  Bruce Ball is a 78 y.o. male who presented to the ED after a fall at home. Per spouse, patient had some trouble walking due to worsening of his chronic right hip pain and weakness  evening before admission and required assistant to get into bed. They found him next to his bed the next morning (day of admission.  Clinical Impression   Pt admitted with above diagnosis. Lives at home with family, in a two-level home with a small threshold-type step to enter; Prior to admission, pt was able to walk household distances with a cane, uses Rolaltor or power scooter for community distances; Presents to PT with generalized weakness and decr activity tolerance; Needed min assist to come to EOB, and to sand, and walk in the room; I anticipate a good recovery as he improves medically;  Pt currently with functional limitations due to the deficits listed below (see PT Problem List). Pt will benefit from skilled PT to increase their independence and safety with mobility to allow discharge to the venue listed below.           If plan is discharge home, recommend the following: A little help with walking and/or transfers;A little help with bathing/dressing/bathroom;Help with stairs or ramp for entrance   Can travel by private vehicle        Equipment Recommendations None recommended by PT (has needed equipment)  Recommendations for Other Services       Functional Status Assessment Patient has had a recent decline in their functional status and demonstrates the ability to make significant improvements in function in a reasonable and predictable amount of time.     Precautions / Restrictions Precautions Precautions: Fall Restrictions Weight Bearing Restrictions Per Provider Order: No      Mobility  Bed Mobility Overal bed mobility: Needs  Assistance Bed Mobility: Supine to Sit     Supine to sit: Min assist     General bed mobility comments: Min handheld assist to pull to sit    Transfers Overall transfer level: Needs assistance Equipment used: None Transfers: Sit to/from Stand Sit to Stand: Min assist           General transfer comment: Min assist to steady; very slow rise, and pt tends to brace backs of LEs against bed    Ambulation/Gait Ambulation/Gait assistance: Min assist Gait Distance (Feet): 5 Feet Assistive device: 1 person hand held assist Gait Pattern/deviations: Decreased step length - right, Decreased step length - left       General Gait Details: walked short distance form bd to recliner  Stairs            Wheelchair Mobility     Tilt Bed    Modified Rankin (Stroke Patients Only)       Balance                                             Pertinent Vitals/Pain Pain Assessment Pain Assessment: No/denies pain    Home Living Family/patient expects to be discharged to:: Private residence Living Arrangements: Spouse/significant other Available Help at Discharge: Available 24 hours/day Type of Home: House Home Access: Stairs to enter   [employer] of Steps: 1 (small/Threshold)   Home Layout: Two level;Able to live on  main level with bedroom/bathroom Home Equipment: Rollator (4 wheels);Cane - single point;Grab bars - tub/shower;Shower seat - built in      Prior Function Prior Level of Function : Independent/Modified Independent             Mobility Comments: Uses a cane in home, Rollator in community ADLs Comments: reports independence     Extremity/Trunk Assessment   Upper Extremity Assessment Upper Extremity Assessment: Defer to OT evaluation    Lower Extremity Assessment Lower Extremity Assessment: Generalized weakness       Communication      Cognition Arousal: Alert Behavior During Therapy: WFL for tasks  assessed/performed Overall Cognitive Status: Within Functional Limits for tasks assessed                                          General Comments General comments (skin integrity, edema, etc.): NAD on RA    Exercises     Assessment/Plan    PT Assessment Patient needs continued PT services  PT Problem List Decreased strength;Decreased activity tolerance;Decreased balance;Decreased knowledge of use of DME;Decreased mobility;Decreased coordination;Decreased range of motion;Decreased safety awareness       PT Treatment Interventions DME instruction;Gait training;Stair training;Functional mobility training;Therapeutic activities;Therapeutic exercise;Balance training;Cognitive remediation;Patient/family education    PT Goals (Current goals can be found in the Care Plan section)  Acute Rehab PT Goals Patient Stated Goal: get home PT Goal Formulation: With patient/family Time For Goal Achievement: 01/20/24 Potential to Achieve Goals: Good    Frequency Min 1X/week     Co-evaluation               AM-PAC PT 6 Clicks Mobility  Outcome Measure Help needed turning from your back to your side while in a flat bed without using bedrails?: None Help needed moving from lying on your back to sitting on the side of a flat bed without using bedrails?: A Little Help needed moving to and from a bed to a chair (including a wheelchair)?: A Little Help needed standing up from a chair using your arms (e.g., wheelchair or bedside chair)?: A Little Help needed to walk in hospital room?: A Little Help needed climbing 3-5 steps with a railing? : A Lot 6 Click Score: 18    End of Session Equipment Utilized During Treatment: Gait belt Activity Tolerance: Patient tolerated treatment well Patient left: in chair;with call bell/phone within reach;with chair alarm set Nurse Communication: Mobility status PT Visit Diagnosis: Unsteadiness on feet (R26.81);Muscle weakness  (generalized) (M62.81);History of falling (Z91.81)    Time: 8985-8958 PT Time Calculation (min) (ACUTE ONLY): 27 min   Charges:   PT Evaluation $PT Eval Low Complexity: 1 Low PT Treatments $Therapeutic Activity: 8-22 mins PT General Charges $$ ACUTE PT VISIT: 1 Visit         Silvano Currier, PT  Acute Rehabilitation Services Office [phone] Secure Chat welcomed   Silvano VEAR Currier 01/06/2024, 2:00 PM

## 2024-01-06 NOTE — Evaluation (Signed)
 Occupational Therapy Evaluation Patient Details Name: Bruce Ball MRN: [id number] DOB: [birth year] Today's Date: 01/06/2024   History of Present Illness Bruce Ball is a 78 y.o. male who presented to the ED after a fall at home. Per spouse, patient had some trouble walking due to worsening of his chronic right hip pain and weakness  evening before admission and required assistant to get into bed. They found him next to his bed the next morning (day of admission.   Clinical Impression   Pt c/o no pain, feeling okay. Pt lives with wife who is available 24/7, PLOF mod I with cane/rollator, occasional help from wife with dressing, states he often uses sock aide/reacher for LB dressing. Today, Pt required mod A for LB dressing, set up/supervision for UB ADLs, CGA for transfers and ambulation down hall using RW. Pt overall doing well, displays overall fair balance but would not be able to tolerate much challenge. Pt was not able to recall month/day/year even with increased time, was not able to guess, wife states he is normally fully oriented. Pt would benefit from continued acute OT to maximize progress as able, HHOT follow up recommended to maximize safety/independence in home setting. Pt/wife state they have no DME needs for home.       If plan is discharge home, recommend the following: A little help with walking and/or transfers;A lot of help with bathing/dressing/bathroom;Assistance with cooking/housework;Assist for transportation;Help with stairs or ramp for entrance    Functional Status Assessment  Patient has had a recent decline in their functional status and demonstrates the ability to make significant improvements in function in a reasonable and predictable amount of time.  Equipment Recommendations  None recommended by OT    Recommendations for Other Services       Precautions / Restrictions Precautions Precautions: Fall Restrictions Weight Bearing Restrictions Per Provider  Order: No      Mobility Bed Mobility Overal bed mobility: Needs Assistance Bed Mobility: Supine to Sit, Sit to Supine     Supine to sit: Min assist Sit to supine: Min assist   General bed mobility comments: Min handheld assist to pull to sit    Transfers Overall transfer level: Needs assistance Equipment used: Rolling walker (2 wheels) Transfers: Sit to/from Stand, Bed to chair/wheelchair/BSC Sit to Stand: Contact guard assist           General transfer comment: CGA for mobility      Balance Overall balance assessment: Needs assistance Sitting-balance support: No upper extremity supported, Feet supported Sitting balance-Leahy Scale: Fair     Standing balance support: No upper extremity supported, During functional activity Standing balance-Leahy Scale: Fair Standing balance comment: able to stand unsupported, not able to tolerate more than mild challenge to balance.                           ADL either performed or assessed with clinical judgement   ADL Overall ADL's : Needs assistance/impaired Eating/Feeding: Independent   Grooming: Supervision/safety;Standing   Upper Body Bathing: Set up;Sitting   Lower Body Bathing: Moderate assistance;Sitting/lateral leans   Upper Body Dressing : Set up;Sitting   Lower Body Dressing: Moderate assistance;Sit to/from stand   Toilet Transfer: Contact guard assist   Toileting- Clothing Manipulation and Hygiene: Minimal assistance       Functional mobility during ADLs: Contact guard assist;Rolling walker (2 wheels) General ADL Comments: Pt close to baseline, states he uses equipment to help with LB ADLs.  Vision Baseline Vision/History: 1 Wears glasses Ability to See in Adequate Light: 0 Adequate Patient Visual Report: No change from baseline       Perception         Praxis         Pertinent Vitals/Pain Pain Assessment Pain Assessment: No/denies pain     Extremity/Trunk Assessment Upper  Extremity Assessment Upper Extremity Assessment: Defer to OT evaluation   Lower Extremity Assessment Lower Extremity Assessment: Generalized weakness       Communication Communication Communication: No apparent difficulties   Cognition Arousal: Alert Behavior During Therapy: WFL for tasks assessed/performed Overall Cognitive Status: Impaired/Different from baseline                                 General Comments: not oriented to date, was not able to give month, day ,or year, was not able to guess or give best estimate, increased time for responding. Oriented to self, situation, and location.     General Comments  NAD on RA    Exercises     Shoulder Instructions      Home Living Family/patient expects to be discharged to:: Private residence Living Arrangements: Spouse/significant other Available Help at Discharge: Available 24 hours/day Type of Home: House Home Access: Stairs to enter [employer] of Steps: 1   Home Layout: Two level;Able to live on main level with bedroom/bathroom     Bathroom Shower/Tub: Producer, Television/film/video: Standard     Home Equipment: Rollator (4 wheels);Cane - single point;Grab bars - tub/shower;Shower seat - built in;Wheelchair - Runner, Broadcasting/film/video   Additional Comments: Pt lives with wife who is available 24/7.      Prior Functioning/Environment Prior Level of Function : Independent/Modified Independent             Mobility Comments: Uses a cane in home, Rollator in community ADLs Comments: reports independence        OT Problem List: Decreased strength;Decreased range of motion;Decreased activity tolerance;Impaired balance (sitting and/or standing);Decreased safety awareness;Pain;Decreased cognition      OT Treatment/Interventions: Self-care/ADL training;Therapeutic exercise;Energy conservation;DME and/or AE instruction;Therapeutic activities;Patient/family education;Balance  training    OT Goals(Current goals can be found in the care plan section) Acute Rehab OT Goals Patient Stated Goal: to improve balance OT Goal Formulation: With patient/family Time For Goal Achievement: 01/20/24 Potential to Achieve Goals: Good  OT Frequency: Min 1X/week    Co-evaluation              AM-PAC OT 6 Clicks Daily Activity     Outcome Measure Help from another person eating meals?: None Help from another person taking care of personal grooming?: A Little Help from another person toileting, which includes using toliet, bedpan, or urinal?: A Little Help from another person bathing (including washing, rinsing, drying)?: A Lot Help from another person to put on and taking off regular upper body clothing?: A Little Help from another person to put on and taking off regular lower body clothing?: A Lot 6 Click Score: 17   End of Session Equipment Utilized During Treatment: Gait belt;Rolling walker (2 wheels) Nurse Communication: Mobility status  Activity Tolerance: Patient tolerated treatment well Patient left: in bed;with call bell/phone within reach;with family/visitor present  OT Visit Diagnosis: Unsteadiness on feet (R26.81);Other abnormalities of gait and mobility (R26.89);Muscle weakness (generalized) (M62.81);History of falling (Z91.81)  Time: 8467-8393 OT Time Calculation (min): 34 min Charges:  OT General Charges $OT Visit: 1 Visit OT Evaluation $OT Eval Moderate Complexity: 1 Mod OT Treatments $Self Care/Home Management : 8-22 mins  [place], OTR/L   Elouise JONELLE Bott 01/06/2024, 4:27 PM

## 2024-01-07 DIAGNOSIS — W19XXXA Unspecified fall, initial encounter: Secondary | ICD-10-CM | POA: Diagnosis not present

## 2024-01-07 DIAGNOSIS — R531 Weakness: Secondary | ICD-10-CM | POA: Diagnosis not present

## 2024-01-07 LAB — CBC WITH DIFFERENTIAL/PLATELET
Abs Immature Granulocytes: 0.01 10*3/uL (ref 0.00–0.07)
Basophils Absolute: 0 10*3/uL (ref 0.0–0.1)
Basophils Relative: 1 %
Eosinophils Absolute: 0.3 10*3/uL (ref 0.0–0.5)
Eosinophils Relative: 5 %
HCT: 44.9 % (ref 39.0–52.0)
Hemoglobin: 14.9 g/dL (ref 13.0–17.0)
Immature Granulocytes: 0 %
Lymphocytes Relative: 25 %
Lymphs Abs: 1.3 10*3/uL (ref 0.7–4.0)
MCH: 28.4 pg (ref 26.0–34.0)
MCHC: 33.2 g/dL (ref 30.0–36.0)
MCV: 85.7 fL (ref 80.0–100.0)
Monocytes Absolute: 0.8 10*3/uL (ref 0.1–1.0)
Monocytes Relative: 15 %
Neutro Abs: 2.9 10*3/uL (ref 1.7–7.7)
Neutrophils Relative %: 54 %
Platelets: 160 10*3/uL (ref 150–400)
RBC: 5.24 MIL/uL (ref 4.22–5.81)
RDW: 13.8 % (ref 11.5–15.5)
WBC: 5.3 10*3/uL (ref 4.0–10.5)
nRBC: 0 % (ref 0.0–0.2)

## 2024-01-07 LAB — BASIC METABOLIC PANEL
Anion gap: 9 (ref 5–15)
BUN: 23 mg/dL (ref 8–23)
CO2: 25 mmol/L (ref 22–32)
Calcium: 8.3 mg/dL — ABNORMAL LOW (ref 8.9–10.3)
Chloride: 103 mmol/L (ref 98–111)
Creatinine, Ser: 1.61 mg/dL — ABNORMAL HIGH (ref 0.61–1.24)
GFR, Estimated: 44 mL/min — ABNORMAL LOW (ref >60)
Glucose, Bld: 96 mg/dL (ref 70–99)
Potassium: 3.2 mmol/L — ABNORMAL LOW (ref 3.5–5.1)
Sodium: 137 mmol/L (ref 135–145)

## 2024-01-07 MED ORDER — POTASSIUM CHLORIDE CRYS ER 20 MEQ PO TBCR
40.0000 meq | EXTENDED_RELEASE_TABLET | Freq: Once | ORAL | Status: AC
  Administered 2024-01-07: 40 meq via ORAL
  Filled 2024-01-07 (×2): qty 2

## 2024-01-07 NOTE — Progress Notes (Signed)
 Patient and wife verbalized understanding of dc instructions. All paperwork and belongings given to patient.

## 2024-01-07 NOTE — Plan of Care (Signed)
   Problem: Education: Goal: Knowledge of General Education information will improve Description: Including pain rating scale, medication(s)/side effects and non-pharmacologic comfort measures Outcome: Progressing   Problem: Activity: Goal: Risk for activity intolerance will decrease Outcome: Progressing   Problem: Nutrition: Goal: Adequate nutrition will be maintained Outcome: Progressing   Problem: Coping: Goal: Level of anxiety will decrease Outcome: Progressing

## 2024-01-07 NOTE — Discharge Summary (Signed)
 Physician Discharge Summary  Bruce Ball FMW:[id number] DOB: 1946-05-26 DOA: 01/05/2024  PCP: Katrinka Garnette KIDD, MD  Admit date: 01/05/2024 Discharge date: 01/07/2024  Admitted From: Home Disposition:  Home  Discharge Condition:Stable CODE STATUS:FULL Diet recommendation: Heart Healthy   Brief/Interim Summary: Patient is a 78 year old male with history of PE, CKD stage III, history of prostrate cancer status post resection, lumbosacral radiculopathy, coronary artery disease, PVCs on Coreg , hypertension, GERD who presented from home with unwitnessed fall.  On presentation he was also confused.  CT head, CT angiogram of the chest, CT abdomen/pelvis did not show any acute findings.  Fall suspected to be in the setting of his known lumbosacral radiculopathy.  He worked with physical therapy, home health recommended.  He feels very comfortable today and wants to go home.  We checked his orthostatic vitals.  Though systolic blood pressure dropped slightly when he stood up from sitting position, he did not feel any lightheadedness or dizziness.  Medically stable for discharge home today.  Following problems were addressed during the hospitalization:  Fall: Lives with his wife.  History of known lumbosacral radiculopathy.  Denies any back pain today.  PT recommend home health. We checked his orthostatic vitals.  Though systolic blood pressure dropped slightly when he stood up from sitting position, he did not feel any lightheadedness or dizziness.    Confusion/one-time low-grade fever: Low suspicion for infectious process.  Chest x-ray did not show pneumonia.  No leukocytosis.  Afebrile this morning.  Coronary artery disease/chronic PVCs: Takes Coreg , Lasix  20 mg.  No anginal symptoms  History of DVT/PE: Currently on Eliquis   History of osteosarcoma: Has chronic right lower extremity swelling.  He uses walker/cane for ambulation  CKD stage IIIa: Baseline creatinine around 1.5.  Currently kidney  function close to baseline.   Discharge Diagnoses:  Principal Problem:   Fall Active Problems:   Syncope    Discharge Instructions  Discharge Instructions     Diet - low sodium heart healthy   Complete by: As directed    Discharge instructions   Complete by: As directed    1)Please take your medications as instructed 2)Follow up with your PCP in a week 3)Follow up with home health PT   Face-to-face encounter (required for Medicare/Medicaid patients)   Complete by: As directed    I Selinda Sias certify that this patient is under my care and that I, or a nurse practitioner or physician's assistant working with me, had a face-to-face encounter that meets the physician face-to-face encounter requirements with this patient on 01/05/2024. The encounter with the patient was in whole, or in part for the following medical condition(s) which is the primary reason for home health care (List medical condition): worsening pain, weakness and falls. Further orders per PCP   The encounter with the patient was in whole, or in part, for the following medical condition, which is the primary reason for home health care: worsening pain, balance and falls   I certify that, based on my findings, the following services are medically necessary home health services: Physical therapy   Reason for Medically Necessary Home Health Services:  Therapy- Investment Banker, Operational, Patent Examiner Therapy- Instruction on use of Assistive Device for Ambulation on all Surfaces Skilled Nursing- Change/Decline in Patient Status     My clinical findings support the need for the above services: Pain interferes with ambulation/mobility   Further, I certify that my clinical findings support that this patient is homebound due to: Pain interferes  with ambulation/mobility   Home Health   Complete by: As directed    To provide the following care/treatments:  PT OT     Increase activity slowly   Complete by: As  directed       Allergies as of 01/07/2024       Reactions   Tape Other (See Comments)   NEEDS COBAN WRAP- NO TAPE!!!!- SKIN TEARS VERY EASILY!!!!   Erythromycin Nausea And Vomiting   Lisinopril Other (See Comments)   Lowered the blood pressure TOO MUCH!! All ACE Inhibitors per wife at bedside        Medication List     STOP taking these medications    amoxicillin -clavulanate 875-125 MG tablet Commonly known as: AUGMENTIN    oxyCODONE -acetaminophen  5-325 MG tablet Commonly known as: Percocet       TAKE these medications    acetaminophen  500 MG tablet Commonly known as: TYLENOL  Take 2 tablets (1,000 mg total) by mouth every 6 (six) hours as needed for headache, fever, moderate pain (pain score 4-6) or mild pain (pain score 1-3).   apixaban  2.5 MG Tabs tablet Commonly known as: Eliquis  Take 1 tablet (2.5 mg total) by mouth 2 (two) times daily.   atorvastatin  20 MG tablet Commonly known as: LIPITOR Take 1 tablet (20 mg total) by mouth at bedtime.   carvedilol  25 MG tablet Commonly known as: COREG  Take 25 mg by mouth 2 (two) times daily with a meal.   cetirizine 10 MG tablet Commonly known as: ZYRTEC Take 10 mg by mouth as needed for allergies.   fluticasone  50 MCG/ACT nasal spray Commonly known as: FLONASE  Place 1-2 sprays into both nostrils daily as needed for allergies or rhinitis.   furosemide  20 MG tablet Commonly known as: LASIX  Take 20 mg by mouth daily as needed for fluid (if the feet and/or legs swell).   HYDROmorphone  4 MG tablet Commonly known as: Dilaudid  Take 0.5 tablets (2 mg total) by mouth every 12 (twelve) hours as needed for severe pain (pain score 7-10).   methocarbamol  500 MG tablet Commonly known as: ROBAXIN  Take 1 tablet (500 mg total) by mouth every 8 (eight) hours as needed for muscle spasms.   multivitamin capsule Take 1 capsule by mouth daily.   pantoprazole  40 MG tablet Commonly known as: PROTONIX  Take 40 mg by mouth 2  (two) times daily before a meal.   potassium citrate  10 MEQ (1080 MG) SR tablet Commonly known as: UROCIT-K  Take 1 tablet (10 mEq total) by mouth as directed.   Vitamin D3 50 MCG (2000 UT) capsule Take 2,000 Units by mouth in the morning and at bedtime.        Allergies  Allergen Reactions   Tape Other (See Comments)    NEEDS COBAN WRAP- NO TAPE!!!!- SKIN TEARS VERY EASILY!!!!   Erythromycin Nausea And Vomiting   Lisinopril Other (See Comments)    Lowered the blood pressure TOO MUCH!! All ACE Inhibitors per wife at bedside    Consultations: None   Procedures/Studies: DG CHEST PORT 1 VIEW Result Date: 01/06/2024 CLINICAL DATA:  Fever EXAM: PORTABLE CHEST 1 VIEW COMPARISON:  Chest CT dated 01/05/2024. FINDINGS: No focal consolidation, pleural effusion, or pneumothorax. The cardiac silhouette is within normal limits. No acute osseous pathology. IMPRESSION: No active disease. Electronically Signed   By: Vanetta Chou M.D.   On: 01/06/2024 09:00   CT HEAD WO CONTRAST ( ) Result Date: 01/05/2024 CLINICAL DATA:  Initial evaluation for mental status change, unknown cause. Fall. EXAM:  CT HEAD WITHOUT CONTRAST TECHNIQUE: Contiguous axial images were obtained from the base of the skull through the vertex without intravenous contrast. RADIATION DOSE REDUCTION: This exam was performed according to the departmental dose-optimization program which includes automated exposure control, adjustment of the mA and/or kV according to patient size and/or use of iterative reconstruction technique. COMPARISON:  Prior CT from earlier the same day. FINDINGS: Brain: Cerebral volume within normal limits. Moderate chronic microvascular ischemic disease. No acute intracranial hemorrhage. No acute large vessel territory infarct. No mass lesion or midline shift. No hydrocephalus or extra-axial fluid collection. Vascular: No abnormal hyperdense vessel. Calcified atherosclerosis present at the skull base. Skull:  Scalp soft tissues and calvarium demonstrate no new finding. Sinuses/Orbits: Globes and orbital soft tissues within normal limits. Chronic pan sinusitis with sequelae of prior sinus surgery noted. Mastoid air cells remain clear. Other: None. IMPRESSION: 1. Stable head CT.  No acute intracranial abnormality. 2. Moderate chronic microvascular ischemic disease. 3. Chronic pan sinusitis. Electronically Signed   By: Morene Hoard M.D.   On: 01/05/2024 20:11   CT Angio Chest PE W and/or Wo Contrast Result Date: 01/05/2024 CLINICAL DATA:  Fall with near syncope.  Weakness and confusion. EXAM: CT ANGIOGRAPHY CHEST CT ABDOMEN AND PELVIS WITH CONTRAST TECHNIQUE: Multidetector CT imaging of the chest was performed using the standard protocol during bolus administration of intravenous contrast. Multiplanar CT image reconstructions and MIPs were obtained to evaluate the vascular anatomy. Multidetector CT imaging of the abdomen and pelvis was performed using the standard protocol during bolus administration of intravenous contrast. RADIATION DOSE REDUCTION: This exam was performed according to the departmental dose-optimization program which includes automated exposure control, adjustment of the mA and/or kV according to patient size and/or use of iterative reconstruction technique. CONTRAST:  75mL OMNIPAQUE  IOHEXOL  350 MG/ML SOLN COMPARISON:  Abdomen/pelvis CT 11/18/2022.  Chest CTA 12/23/2021. FINDINGS: CTA CHEST FINDINGS Cardiovascular: The heart size is normal. No substantial pericardial effusion. Coronary artery calcification is evident. Mild atherosclerotic calcification is noted in the wall of the thoracic aorta. There is no filling defect within the opacified pulmonary arteries to suggest the presence of an acute pulmonary embolus. Mediastinum/Nodes: No mediastinal lymphadenopathy. There is no hilar lymphadenopathy. The esophagus has normal imaging features. Tiny hiatal hernia noted. There is no axillary  lymphadenopathy. Lungs/Pleura: Calcified granuloma noted left upper lobe. Tiny noncalcified right middle lobe pulmonary nodules measure less than 4 mm and are stable since 2022 consistent with benign etiology. No followup imaging is recommended. No focal airspace consolidation. There is no evidence of pleural effusion. Musculoskeletal: No worrisome lytic or sclerotic osseous abnormality. Review of the MIP images confirms the above findings. CT ABDOMEN and PELVIS FINDINGS Hepatobiliary: Scattered tiny hypodensities in the liver parenchyma are too small to characterize but are stable in the interval and statistically most likely benign. No followup imaging is recommended. There is no evidence for gallstones, gallbladder wall thickening, or pericholecystic fluid. No intrahepatic or extrahepatic biliary dilation. Pancreas: No focal mass lesion. No dilatation of the main duct. No intraparenchymal cyst. No peripancreatic edema. Spleen: No splenomegaly. No suspicious focal mass lesion. Adrenals/Urinary Tract: No adrenal nodule or mass. Multiple small nonobstructing stones are noted in both kidneys, similar to prior. No evidence for hydroureter. Distal ureters and portions of the bladder are obscured by beam hardening artifact from right hip replacement. Stomach/Bowel: Tiny hiatal hernia. Stomach otherwise unremarkable. Duodenum is normally positioned as is the ligament of Treitz. No small bowel wall thickening. No small bowel dilatation. The terminal  ileum is normal. The appendix is not well visualized, but there is no edema or inflammation in the region of the cecal tip to suggest appendicitis. No gross colonic mass. No colonic wall thickening. A few scattered diverticular seen in the left colon without diverticulitis. Vascular/Lymphatic: There is moderate atherosclerotic calcification of the abdominal aorta without aneurysm. There is no gastrohepatic or hepatoduodenal ligament lymphadenopathy. No retroperitoneal or  mesenteric lymphadenopathy. No pelvic sidewall lymphadenopathy. Reproductive: Prostatectomy. Other: No intraperitoneal free fluid. Musculoskeletal: Right hip replacement. Lumbosacral fusion hardware evident. Review of the MIP images confirms the above findings. IMPRESSION: 1. No CT evidence for acute pulmonary embolus. 2. No acute findings in the chest, abdomen, or pelvis. 3. Tiny hiatal hernia. 4. Bilateral nonobstructing nephrolithiasis. 5.  Aortic Atherosclerosis (ICD10-I70.0). Electronically Signed   By: Camellia Candle M.D.   On: 01/05/2024 05:50   CT ABDOMEN PELVIS W CONTRAST Result Date: 01/05/2024 CLINICAL DATA:  Fall with near syncope.  Weakness and confusion. EXAM: CT ANGIOGRAPHY CHEST CT ABDOMEN AND PELVIS WITH CONTRAST TECHNIQUE: Multidetector CT imaging of the chest was performed using the standard protocol during bolus administration of intravenous contrast. Multiplanar CT image reconstructions and MIPs were obtained to evaluate the vascular anatomy. Multidetector CT imaging of the abdomen and pelvis was performed using the standard protocol during bolus administration of intravenous contrast. RADIATION DOSE REDUCTION: This exam was performed according to the departmental dose-optimization program which includes automated exposure control, adjustment of the mA and/or kV according to patient size and/or use of iterative reconstruction technique. CONTRAST:  75mL OMNIPAQUE  IOHEXOL  350 MG/ML SOLN COMPARISON:  Abdomen/pelvis CT 11/18/2022.  Chest CTA 12/23/2021. FINDINGS: CTA CHEST FINDINGS Cardiovascular: The heart size is normal. No substantial pericardial effusion. Coronary artery calcification is evident. Mild atherosclerotic calcification is noted in the wall of the thoracic aorta. There is no filling defect within the opacified pulmonary arteries to suggest the presence of an acute pulmonary embolus. Mediastinum/Nodes: No mediastinal lymphadenopathy. There is no hilar lymphadenopathy. The esophagus  has normal imaging features. Tiny hiatal hernia noted. There is no axillary lymphadenopathy. Lungs/Pleura: Calcified granuloma noted left upper lobe. Tiny noncalcified right middle lobe pulmonary nodules measure less than 4 mm and are stable since 2022 consistent with benign etiology. No followup imaging is recommended. No focal airspace consolidation. There is no evidence of pleural effusion. Musculoskeletal: No worrisome lytic or sclerotic osseous abnormality. Review of the MIP images confirms the above findings. CT ABDOMEN and PELVIS FINDINGS Hepatobiliary: Scattered tiny hypodensities in the liver parenchyma are too small to characterize but are stable in the interval and statistically most likely benign. No followup imaging is recommended. There is no evidence for gallstones, gallbladder wall thickening, or pericholecystic fluid. No intrahepatic or extrahepatic biliary dilation. Pancreas: No focal mass lesion. No dilatation of the main duct. No intraparenchymal cyst. No peripancreatic edema. Spleen: No splenomegaly. No suspicious focal mass lesion. Adrenals/Urinary Tract: No adrenal nodule or mass. Multiple small nonobstructing stones are noted in both kidneys, similar to prior. No evidence for hydroureter. Distal ureters and portions of the bladder are obscured by beam hardening artifact from right hip replacement. Stomach/Bowel: Tiny hiatal hernia. Stomach otherwise unremarkable. Duodenum is normally positioned as is the ligament of Treitz. No small bowel wall thickening. No small bowel dilatation. The terminal ileum is normal. The appendix is not well visualized, but there is no edema or inflammation in the region of the cecal tip to suggest appendicitis. No gross colonic mass. No colonic wall thickening. A few scattered diverticular seen in  the left colon without diverticulitis. Vascular/Lymphatic: There is moderate atherosclerotic calcification of the abdominal aorta without aneurysm. There is no  gastrohepatic or hepatoduodenal ligament lymphadenopathy. No retroperitoneal or mesenteric lymphadenopathy. No pelvic sidewall lymphadenopathy. Reproductive: Prostatectomy. Other: No intraperitoneal free fluid. Musculoskeletal: Right hip replacement. Lumbosacral fusion hardware evident. Review of the MIP images confirms the above findings. IMPRESSION: 1. No CT evidence for acute pulmonary embolus. 2. No acute findings in the chest, abdomen, or pelvis. 3. Tiny hiatal hernia. 4. Bilateral nonobstructing nephrolithiasis. 5.  Aortic Atherosclerosis (ICD10-I70.0). Electronically Signed   By: Camellia Candle M.D.   On: 01/05/2024 05:50   CT Head Wo Contrast Result Date: 01/05/2024 CLINICAL DATA:  Head trauma, minor (Age >= 65y); Neck trauma (Age >= 65y) EXAM: CT HEAD WITHOUT CONTRAST CT CERVICAL SPINE WITHOUT CONTRAST TECHNIQUE: Multidetector CT imaging of the head and cervical spine was performed following the standard protocol without intravenous contrast. Multiplanar CT image reconstructions of the cervical spine were also generated. RADIATION DOSE REDUCTION: This exam was performed according to the departmental dose-optimization program which includes automated exposure control, adjustment of the mA and/or kV according to patient size and/or use of iterative reconstruction technique. COMPARISON:  CT head and CT cervical spine November 11, 2022. FINDINGS: CT HEAD FINDINGS Brain: No evidence of acute large vascular territory infarction, hemorrhage, hydrocephalus, extra-axial collection or mass lesion/mass effect. Patchy white matter hypodensities are nonspecific but compatible with chronic microvascular ischemic change. Cerebral atrophy. Vascular: Calcific atherosclerosis. No hyperdense vessel identified. Skull: No acute fracture. Sinuses/Orbits: Pansinus mucosal thickening with near total opacification of the ethmoid air cells. Other: No mastoid effusions. CT CERVICAL SPINE FINDINGS Alignment: Normal. Skull base and  vertebrae: Vertebral body heights are maintained. No acute fracture. Soft tissues and spinal canal: No prevertebral fluid or swelling. No visible canal hematoma. Disc levels: Moderate degenerative disc disease at C5-C6. Otherwise, mild bony degenerative changes. Upper chest: Visualized lung apices are clear. IMPRESSION: 1. No evidence of acute abnormality intracranially or in the cervical spine. 2. Chronic pansinusitis. Electronically Signed   By: Gilmore GORMAN Molt M.D.   On: 01/05/2024 03:14   CT Cervical Spine Wo Contrast Result Date: 01/05/2024 CLINICAL DATA:  Head trauma, minor (Age >= 65y); Neck trauma (Age >= 65y) EXAM: CT HEAD WITHOUT CONTRAST CT CERVICAL SPINE WITHOUT CONTRAST TECHNIQUE: Multidetector CT imaging of the head and cervical spine was performed following the standard protocol without intravenous contrast. Multiplanar CT image reconstructions of the cervical spine were also generated. RADIATION DOSE REDUCTION: This exam was performed according to the departmental dose-optimization program which includes automated exposure control, adjustment of the mA and/or kV according to patient size and/or use of iterative reconstruction technique. COMPARISON:  CT head and CT cervical spine November 11, 2022. FINDINGS: CT HEAD FINDINGS Brain: No evidence of acute large vascular territory infarction, hemorrhage, hydrocephalus, extra-axial collection or mass lesion/mass effect. Patchy white matter hypodensities are nonspecific but compatible with chronic microvascular ischemic change. Cerebral atrophy. Vascular: Calcific atherosclerosis. No hyperdense vessel identified. Skull: No acute fracture. Sinuses/Orbits: Pansinus mucosal thickening with near total opacification of the ethmoid air cells. Other: No mastoid effusions. CT CERVICAL SPINE FINDINGS Alignment: Normal. Skull base and vertebrae: Vertebral body heights are maintained. No acute fracture. Soft tissues and spinal canal: No prevertebral fluid or  swelling. No visible canal hematoma. Disc levels: Moderate degenerative disc disease at C5-C6. Otherwise, mild bony degenerative changes. Upper chest: Visualized lung apices are clear. IMPRESSION: 1. No evidence of acute abnormality intracranially or in the cervical spine. 2.  Chronic pansinusitis. Electronically Signed   By: Gilmore GORMAN Molt M.D.   On: 01/05/2024 03:14   DG Chest Port 1 View Result Date: 01/05/2024 CLINICAL DATA:  Trauma EXAM: PORTABLE CHEST 1 VIEW COMPARISON:  Chest x-ray 09/16/2022. FINDINGS: The heart size and mediastinal contours are within normal limits. Both lungs are clear. No visible pleural effusions or pneumothorax. No acute osseous abnormality. IMPRESSION: No active disease. Electronically Signed   By: Gilmore GORMAN Molt M.D.   On: 01/05/2024 03:03   DG Pelvis Portable Result Date: 01/05/2024 CLINICAL DATA:  Fall EXAM: PORTABLE PELVIS 1-2 VIEWS COMPARISON:  11/11/2022 FINDINGS: Prior right hip replacement. Postoperative changes in the lower lumbar spine. No acute fracture, subluxation or dislocation. IMPRESSION: No acute bony abnormality. Electronically Signed   By: Franky Crease M.D.   On: 01/05/2024 02:55      Subjective: Patient seen and examined at bedside today.  Hemodynamically stable.  Appears comfortable.  Very eager to go home.  Denies any lightheadedness or dizziness.  Completely alert and oriented.  Discharge plan discussed with wife at bedside  Discharge Exam: Vitals:   01/07/24 0502 01/07/24 0905  BP: 130/74 115/64  Pulse: 94 93  Resp: 20 16  Temp: 98.5 F (36.9 C) 98.1 F (36.7 C)  SpO2: 96% 94%   Vitals:   01/06/24 0940 01/06/24 1951 01/07/24 0502 01/07/24 0905  BP: 137/73 137/71 130/74 115/64  Pulse: 81 74 94 93  Resp: 16 20 20 16   Temp: 98.6 F (37 C) 98.5 F (36.9 C) 98.5 F (36.9 C) 98.1 F (36.7 C)  TempSrc: Oral Oral Oral Oral  SpO2: 96% 96% 96% 94%  Weight:      Height:        General: Pt is alert, awake, not in acute  distress,obese Cardiovascular: RRR, S1/S2 +, no rubs, no gallops Respiratory: CTA bilaterally, no wheezing, no rhonchi Abdominal: Soft, NT, ND, bowel sounds + Extremities: no edema, no cyanosis    The results of significant diagnostics from this hospitalization (including imaging, microbiology, ancillary and laboratory) are listed below for reference.     Microbiology: No results found for this or any previous visit (from the past 240 hours).   Labs: BNP (last 3 results) Recent Labs    01/05/24 0634  BNP 58.3   Basic Metabolic Panel: Recent Labs  Lab 01/05/24 0256 01/05/24 0435 01/05/24 2051 01/06/24 0849 01/07/24 0454  NA 138  --   --  137 137  K 3.9  --   --  3.5 3.2*  CL 106  --   --  101 103  CO2 20*  --   --  24 25  GLUCOSE 102*  --   --  104* 96  BUN 15  --   --  16 23  CREATININE 1.50*  --   --  1.43* 1.61*  CALCIUM  8.7*  --   --  8.7* 8.3*  MG  --  1.9  --   --   --   PHOS  --   --  2.7  --   --    Liver Function Tests: Recent Labs  Lab 01/05/24 0256 01/06/24 0849  AST 27 27  ALT 21 19  ALKPHOS 62 55  BILITOT 1.2 1.3*  PROT 6.6 6.5  ALBUMIN 3.5 3.4*   No results for input(s): LIPASE, AMYLASE in the last 168 hours. No results for input(s): AMMONIA in the last 168 hours. CBC: Recent Labs  Lab 01/05/24 0256 01/06/24 0849 01/07/24 0454  WBC 7.8 6.7 5.3  NEUTROABS 5.8 4.9 2.9  HGB 16.0 16.2 14.9  HCT 49.0 48.3 44.9  MCV 88.9 86.4 85.7  PLT 171 171 160   Cardiac Enzymes: No results for input(s): CKTOTAL, CKMB, CKMBINDEX, TROPONINI in the last 168 hours. BNP: Invalid input(s): POCBNP CBG: Recent Labs  Lab 01/05/24 1916 01/06/24 0141  GLUCAP 106* 97   D-Dimer No results for input(s): DDIMER in the last 72 hours. Hgb A1c No results for input(s): HGBA1C in the last 72 hours. Lipid Profile No results for input(s): CHOL, HDL, LDLCALC, TRIG, CHOLHDL, LDLDIRECT in the last 72 hours. Thyroid  function  studies Recent Labs    01/05/24 2100  TSH 0.440   Anemia work up Recent Labs    01/05/24 2051  VITAMINB12 299   Urinalysis    Component Value Date/Time   COLORURINE YELLOW 01/05/2024 0405   APPEARANCEUR CLEAR 01/05/2024 0405   LABSPEC 1.018 01/05/2024 0405   PHURINE 6.0 01/05/2024 0405   GLUCOSEU NEGATIVE 01/05/2024 0405   GLUCOSEU NEGATIVE 01/28/2023 1340   HGBUR SMALL (A) 01/05/2024 0405   HGBUR large 02/26/2010 1214   BILIRUBINUR NEGATIVE 01/05/2024 0405   BILIRUBINUR 1+ 06/26/2016 1117   KETONESUR 20 (A) 01/05/2024 0405   PROTEINUR 30 (A) 01/05/2024 0405   UROBILINOGEN 0.2 01/28/2023 1340   NITRITE NEGATIVE 01/05/2024 0405   LEUKOCYTESUR NEGATIVE 01/05/2024 0405   Sepsis Labs Recent Labs  Lab 01/05/24 0256 01/06/24 0849 01/07/24 0454  WBC 7.8 6.7 5.3   Microbiology No results found for this or any previous visit (from the past 240 hours).  Please note: You were cared for by a hospitalist during your hospital stay. Once you are discharged, your primary care physician will handle any further medical issues. Please note that NO REFILLS for any discharge medications will be authorized once you are discharged, as it is imperative that you return to your primary care physician (or establish a relationship with a primary care physician if you do not have one) for your post hospital discharge needs so that they can reassess your need for medications and monitor your lab values.    Time coordinating discharge: 40 minutes  SIGNED:   Ivonne Mustache, MD  [facility] 01/07/2024, 2:15 PM Pager [phone]  If 7PM-7AM, please contact night-coverage [email or web address] Password [email or web address]

## 2024-01-07 NOTE — TOC CM/SW Note (Signed)
 Transition of Care [facility]) - Inpatient Brief Assessment   Patient Details  Name: Bruce Ball MRN: [id number] Date of Birth: 03-15-1946  [facility]) CM/SW Contact:    Tom-Johnson, Lars Jeziorski Daphne, RN Phone Number: 01/07/2024, 9:07 AM   Clinical Narrative:  Patient presented to the ED after an unwitnessed fall at home. Patient was found by the bedside on floor by family.  Patient has hx of recurrent Falls, Prostate Cancer,PE/DVT, on Eliquis  at home, CAD, Bone Cancer, GERD, HTN, HLD, Osteoarthritis, PAC's, CKD stage IIIA and Chronic Rt Hip pain.     From home with wife,[name]. Has two supportive children. Retired and modified independent by the use of cane and walker at home. Able to drive self.  Has a cane, walker, w/c, and shower seat at home. PCP is Katrinka Garnette KIDD, MD and uses [facility] on [address].   Home health PT/OT recommended, patient has no preference. Referral called in to [facility] and [facility] voiced acceptance, info on AVS.  Patient not Medically ready for discharge.  CM will continue to follow as patient progresses with care towards discharge.      Transition of Care Asessment: Insurance and Status: Insurance coverage has been reviewed Patient has primary care physician: Yes Home environment has been reviewed: Yes Prior level of function:: Modified independent Prior/Current Home Services: No current home services Social Drivers of Health Review: SDOH reviewed no interventions necessary Readmission risk has been reviewed: Yes Transition of care needs: transition of care needs identified, TOC will continue to follow

## 2024-01-07 NOTE — Progress Notes (Signed)
 Orthostatic vitals:  Supine- 137/73  HR-75  Sitting-  156/84  HR- 73  Standing-  112/73   HR-91

## 2024-01-07 NOTE — TOC Transition Note (Signed)
 Transition of Care [facility]) - Discharge Note   Patient Details  Name: Bruce Ball MRN: [id number] Date of Birth: [birth year]  Transition of Care [facility]) CM/SW Contact:  Tom-Johnson, Aime Carreras Daphne, RN Phone Number: 01/07/2024, 3:41 PM   Clinical Narrative:     Patient is scheduled for discharge today.  Home health info, hospital f/u and discharge instructions on AVS. Wife, Darice to transport at discharge.  No further TOC needs noted.       Final next level of care: Home w Home Health Services Barriers to Discharge: Barriers Resolved   Patient Goals and CMS Choice Patient states their goals for this hospitalization and ongoing recovery are:: To return home CMS Medicare.gov Compare Post Acute Care list provided to:: Patient Choice offered to / list presented to : Patient, Spouse      Discharge Placement                Patient to be transferred to facility by: Wife Name of family member notified: Darice    Discharge Plan and Services Additional resources added to the After Visit Summary for                  DME Arranged: N/A DME Agency: NA       HH Arranged: PT HH Agency: [facility] Date HH Agency Contacted: 01/06/24 Time HH Agency Contacted: 1708 Representative spoke with at [facility] Agency: [facility]  Social Drivers of Health (SDOH) Interventions SDOH Screenings   Food Insecurity: No Food Insecurity (01/05/2024)  Housing: Low Risk  (01/05/2024)  Transportation Needs: No Transportation Needs (01/05/2024)  Utilities: Not At Risk (01/05/2024)  Alcohol Screen: Low Risk  (01/31/2023)  Depression (PHQ2-9): Low Risk  (02/04/2023)  Financial Resource Strain: Low Risk  (02/04/2023)  Physical Activity: Insufficiently Active (01/25/2022)  Social Connections: Moderately Isolated (01/05/2024)  Stress: No Stress Concern Present (01/31/2023)  Tobacco Use: Medium Risk (01/05/2024)     Readmission Risk Interventions     No data to display

## 2024-01-12 ENCOUNTER — Encounter: Payer: Self-pay | Admitting: Family Medicine

## 2024-01-12 ENCOUNTER — Ambulatory Visit (INDEPENDENT_AMBULATORY_CARE_PROVIDER_SITE_OTHER): Payer: Medicare Other | Admitting: Family Medicine

## 2024-01-12 ENCOUNTER — Telehealth: Payer: Self-pay

## 2024-01-12 VITALS — BP 134/89 | HR 81 | Temp 97.1°F | Ht 71.0 in | Wt 247.4 lb

## 2024-01-12 DIAGNOSIS — M810 Age-related osteoporosis without current pathological fracture: Secondary | ICD-10-CM | POA: Diagnosis not present

## 2024-01-12 DIAGNOSIS — N1831 Chronic kidney disease, stage 3a: Secondary | ICD-10-CM | POA: Diagnosis not present

## 2024-01-12 DIAGNOSIS — Z87442 Personal history of urinary calculi: Secondary | ICD-10-CM | POA: Diagnosis not present

## 2024-01-12 DIAGNOSIS — N529 Male erectile dysfunction, unspecified: Secondary | ICD-10-CM | POA: Diagnosis not present

## 2024-01-12 DIAGNOSIS — I10 Essential (primary) hypertension: Secondary | ICD-10-CM | POA: Diagnosis not present

## 2024-01-12 DIAGNOSIS — M1990 Unspecified osteoarthritis, unspecified site: Secondary | ICD-10-CM | POA: Diagnosis not present

## 2024-01-12 DIAGNOSIS — M5417 Radiculopathy, lumbosacral region: Secondary | ICD-10-CM | POA: Diagnosis not present

## 2024-01-12 DIAGNOSIS — G609 Hereditary and idiopathic neuropathy, unspecified: Secondary | ICD-10-CM | POA: Diagnosis not present

## 2024-01-12 DIAGNOSIS — I5032 Chronic diastolic (congestive) heart failure: Secondary | ICD-10-CM | POA: Diagnosis not present

## 2024-01-12 DIAGNOSIS — E538 Deficiency of other specified B group vitamins: Secondary | ICD-10-CM

## 2024-01-12 DIAGNOSIS — Z7901 Long term (current) use of anticoagulants: Secondary | ICD-10-CM | POA: Diagnosis not present

## 2024-01-12 DIAGNOSIS — Z9181 History of falling: Secondary | ICD-10-CM | POA: Diagnosis not present

## 2024-01-12 DIAGNOSIS — W19XXXA Unspecified fall, initial encounter: Secondary | ICD-10-CM

## 2024-01-12 DIAGNOSIS — E785 Hyperlipidemia, unspecified: Secondary | ICD-10-CM

## 2024-01-12 DIAGNOSIS — G250 Essential tremor: Secondary | ICD-10-CM | POA: Diagnosis not present

## 2024-01-12 DIAGNOSIS — Z8546 Personal history of malignant neoplasm of prostate: Secondary | ICD-10-CM | POA: Diagnosis not present

## 2024-01-12 DIAGNOSIS — K219 Gastro-esophageal reflux disease without esophagitis: Secondary | ICD-10-CM | POA: Diagnosis not present

## 2024-01-12 DIAGNOSIS — Z86711 Personal history of pulmonary embolism: Secondary | ICD-10-CM | POA: Diagnosis not present

## 2024-01-12 DIAGNOSIS — Z86718 Personal history of other venous thrombosis and embolism: Secondary | ICD-10-CM | POA: Diagnosis not present

## 2024-01-12 DIAGNOSIS — I2510 Atherosclerotic heart disease of native coronary artery without angina pectoris: Secondary | ICD-10-CM | POA: Diagnosis not present

## 2024-01-12 DIAGNOSIS — D631 Anemia in chronic kidney disease: Secondary | ICD-10-CM | POA: Diagnosis not present

## 2024-01-12 DIAGNOSIS — G8929 Other chronic pain: Secondary | ICD-10-CM | POA: Diagnosis not present

## 2024-01-12 DIAGNOSIS — I129 Hypertensive chronic kidney disease with stage 1 through stage 4 chronic kidney disease, or unspecified chronic kidney disease: Secondary | ICD-10-CM | POA: Diagnosis not present

## 2024-01-12 DIAGNOSIS — N183 Chronic kidney disease, stage 3 unspecified: Secondary | ICD-10-CM

## 2024-01-12 DIAGNOSIS — Z85830 Personal history of malignant neoplasm of bone: Secondary | ICD-10-CM | POA: Diagnosis not present

## 2024-01-12 DIAGNOSIS — M25551 Pain in right hip: Secondary | ICD-10-CM | POA: Diagnosis not present

## 2024-01-12 DIAGNOSIS — Z860100 Personal history of colon polyps, unspecified: Secondary | ICD-10-CM | POA: Diagnosis not present

## 2024-01-12 LAB — COMPREHENSIVE METABOLIC PANEL
ALT: 21 U/L (ref 0–53)
AST: 21 U/L (ref 0–37)
Albumin: 3.7 g/dL (ref 3.5–5.2)
Alkaline Phosphatase: 64 U/L (ref 39–117)
BUN: 19 mg/dL (ref 6–23)
CO2: 27 meq/L (ref 19–32)
Calcium: 8.5 mg/dL (ref 8.4–10.5)
Chloride: 110 meq/L (ref 96–112)
Creatinine, Ser: 1.38 mg/dL (ref 0.40–1.50)
GFR: 49.3 mL/min — ABNORMAL LOW (ref >60.00)
Glucose, Bld: 95 mg/dL (ref 70–99)
Potassium: 3.9 meq/L (ref 3.5–5.1)
Sodium: 143 meq/L (ref 135–145)
Total Bilirubin: 0.8 mg/dL (ref 0.2–1.2)
Total Protein: 6.2 g/dL (ref 6.0–8.3)

## 2024-01-12 LAB — LIPID PANEL
Cholesterol: 112 mg/dL (ref 0–200)
HDL: 33.4 mg/dL — ABNORMAL LOW (ref >39.00)
LDL Cholesterol: 61 mg/dL (ref 0–99)
NonHDL: 78.34
Total CHOL/HDL Ratio: 3
Triglycerides: 87 mg/dL (ref 0.0–149.0)
VLDL: 17.4 mg/dL (ref 0.0–40.0)

## 2024-01-12 LAB — CBC WITH DIFFERENTIAL/PLATELET
Basophils Absolute: 0 10*3/uL (ref 0.0–0.1)
Basophils Relative: 0.5 % (ref 0.0–3.0)
Eosinophils Absolute: 0.2 10*3/uL (ref 0.0–0.7)
Eosinophils Relative: 3.2 % (ref 0.0–5.0)
HCT: 45.7 % (ref 39.0–52.0)
Hemoglobin: 14.9 g/dL (ref 13.0–17.0)
Lymphocytes Relative: 23.7 % (ref 12.0–46.0)
Lymphs Abs: 1.6 10*3/uL (ref 0.7–4.0)
MCHC: 32.5 g/dL (ref 30.0–36.0)
MCV: 88.1 fL (ref 78.0–100.0)
Monocytes Absolute: 0.5 10*3/uL (ref 0.1–1.0)
Monocytes Relative: 7.2 % (ref 3.0–12.0)
Neutro Abs: 4.5 10*3/uL (ref 1.4–7.7)
Neutrophils Relative %: 65.4 % (ref 43.0–77.0)
Platelets: 225 10*3/uL (ref 150.0–400.0)
RBC: 5.19 Mil/uL (ref 4.22–5.81)
RDW: 14.2 % (ref 11.5–15.5)
WBC: 6.9 10*3/uL (ref 4.0–10.5)

## 2024-01-12 NOTE — Progress Notes (Signed)
 Phone [phone] In person visit   Subjective:   Bruce Ball is a 78 y.o. year old very pleasant male patient who presents for/with See problem oriented charting Chief Complaint  Patient presents with   Hospitalization Follow-up    Pt here for hosp f.u - pt would like referral to neurologist    Allergies   Anemia   Hyperlipidemia   Hypertension    Pt is fasting and has not taken BP medication today    Past Medical History-  Patient Active Problem List   Diagnosis Date Noted   Post-COVID syndrome 09/16/2022    Priority: High   History of pulmonary embolism 10/10/2021    Priority: High   Chronic diastolic CHF (congestive heart failure) (HCC) 10/10/2021    Priority: High   CAD (coronary artery disease) 04/03/2021    Priority: High   Aortic atherosclerosis (HCC) 05/17/2020    Priority: High   Idiopathic neuropathy 01/28/2020    Priority: High   Recurrent falls 01/28/2020    Priority: High   History of prostate cancer 03/08/2017    Priority: High   High risk medication use 02/28/2017    Priority: High   Ataxia 05/18/2016    Priority: High   Vestibular disequilibrium 05/18/2016    Priority: High   Deposits (accretions) on teeth 09/16/2022    Priority: Medium    Chronic pansinusitis 07/11/2022    Priority: Medium    CKD (chronic kidney disease), stage III (HCC) 10/10/2021    Priority: Medium    Hyperlipidemia 10/10/2021    Priority: Medium    B12 deficiency 05/17/2020    Priority: Medium    Iliac artery aneurysm (HCC) 05/17/2020    Priority: Medium    Osteoporosis 04/04/2020    Priority: Medium    GERD (gastroesophageal reflux disease) 12/22/2019    Priority: Medium    Essential tremor 12/22/2019    Priority: Medium    Kidney stone 11/05/2017    Priority: Medium    Peripheral edema 06/08/2012    Priority: Medium    Backache 04/21/2008    Priority: Medium    History of peptic ulcer disease 04/21/2008    Priority: Medium    Essential hypertension  10/29/2007    Priority: Medium    UTI due to Klebsiella species 11/11/2022    Priority: Low   Sensorineural hearing loss, bilateral 09/16/2022    Priority: Low   Chronic anticoagulation 03/26/2021    Priority: Low   Acquired coagulation disorder (HCC) 12/12/2020    Priority: Low   Senile purpura (HCC) 12/12/2020    Priority: Low   History of total right hip replacement 03/08/2017    Priority: Low   DDD (degenerative disc disease), thoracic 02/28/2017    Priority: Low   Lung nodule, solitary 05/24/2014    Priority: Low   Fall 01/05/2024   Basal cell carcinoma of scalp 09/16/2022   Nontoxic single thyroid  nodule 09/16/2022   Thyroid  nodule 09/16/2022   Non-seasonal allergic rhinitis 06/11/2022   Abrasion of right arm 11/19/2021   Femur fracture, right (HCC) 10/17/2021   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 30 tablet 0   apixaban  (ELIQUIS ) 2.5 MG TABS tablet Take 1 tablet (2.5 mg total) by mouth 2 (two) times daily.     atorvastatin  (LIPITOR) 20 MG tablet Take 1 tablet (20 mg total) by mouth at bedtime. (Patient taking differently: Take 20 mg by mouth at bedtime.) 90 tablet 3   Budesonide 2 MG/10ML SUSP Take by mouth. 1/2 twice a day     carvedilol  (COREG ) 25 MG tablet Take 25 mg by mouth 2 (two) times daily with a meal.     cetirizine (ZYRTEC) 10 MG tablet Take 10 mg by mouth as needed for allergies.     Cholecalciferol  (VITAMIN D3) 50 MCG  (2000 UT) capsule Take 2,000 Units by mouth in the morning and at bedtime.      fluticasone  (FLONASE ) 50 MCG/ACT nasal spray Place 1-2 sprays into both nostrils daily as needed for allergies or rhinitis.     furosemide  (LASIX ) 20 MG tablet Take 20 mg by mouth daily as needed for fluid (if the feet and/or legs swell).     HYDROmorphone  (DILAUDID ) 4 MG tablet Take 0.5 tablets (2 mg total) by mouth every 12 (twelve) hours as needed for severe pain (pain score 7-10). 10 tablet 0   methocarbamol  (ROBAXIN ) 500 MG tablet Take 1 tablet (500 mg total) by mouth every 8 (eight) hours as needed for muscle spasms. 20 tablet 0   Multiple Vitamin (MULTIVITAMIN) capsule Take 1 capsule by mouth daily.     pantoprazole  (PROTONIX ) 40 MG tablet Take 40 mg by mouth 2 (two) times daily before a meal.     potassium citrate  (UROCIT-K ) 10 MEQ (1080 MG) SR tablet Take 1 tablet (10 mEq total) by mouth as directed. 90 tablet 1   No current facility-administered medications for this visit.     Objective:  BP 134/89 Comment: Most recent home reading  Pulse 81   Temp (!) 97.1 F (36.2 C)   Ht 5' 11 (1.803 m)   Wt 247 lb 6.4 oz (112.2 kg)   SpO2 96%   BMI 34.51 kg/m  Gen: NAD, resting comfortably CV: RRR no murmurs rubs or gallops Lungs: CTAB no crackles, wheeze, rhonchi Abdomen: soft/nontender/nondistended/normal bowel sounds. No rebound or guarding.  Ext: no edema Skin: warm, dry Neuro: CN II-XII intact, sensation and reflexes normal throughout, 5/5 muscle strength in bilateral upper  extremities. Leg extension 4/5 strength.  Normal finger to nose. Normal rapid alternating movements. No pronator drift. Normal romberg. Normal gait.      Assessment and Plan    # Hospital follow-up S: Patient had an unwitnessed fall at home.wife saw him earlier in the day and he was walking abnormally like hip was bothering him. At some point in the day was unable to get off toilet and speech was off some- when asked what could  be done to help him- a lot of ughs and murmurings without clear directions which is abnormal for him. To present- he still doesn't remember that day. Sleeping at night and around 1 am he was found on the floo-R wife was in another room as ill. He reported blacking out to her. Daughter was also home- they propped him up and called 911 - daughter reports he went limp for a minute while she was sitting with him. In discussions at hospital they did mention possible concussion. Looking bak to June was having  imbalance issues and diplopia - he reports going of the ptho and started on eye drops and that improved diplopia but ongoing imbalance issues. Reports dizziness at home- he doesn't recall hospital and plus was in the bed most of the time. Never had chest pain or shortness of breath . His pain the reports was in the right leg and hip where he fell- that has substantially improved- not on any pain medicine. Thankfully no hip fracture on portable films. Only other change wife knows is that he has been using pneumatic compression on legs for neuropathy peripheral (both legs)   From discharge summary summarization    He was hospitalized from January 05, 2024 to January 07 2024 he was confused at presentation though did not recall hitting his head.  CT of the head, CT angiogram of the chest, CT abdomen pelvis did not show any acute findings.  BNP was not elevated.  Troponin very mildly elevated around 20 elevated but flat trend.  Fall thought related to baseline lumbar radiculopathy-as if legs went out from under him.  Mild drop in systolic blood pressure with orthostatic vital signs without symptoms -He was not having back pain at the time of evaluation. -Does have hydromorphone  available if needed for pain as well as methocarbamol  -PT recommended home health  -As far as the confusion he also was noted to have a one-time low-grade fever there was overall low suspicion for infectious process.  Chest  x-ray was done  as a precaution and no pneumonia.  No leukocytosis.  Afebrile after the one-time elevation and thought stable for discharge   Thankfully they got him set up with neurology but they are going to need to move date as out of town -he is doing physical therapy and occupational therapy at home- I can sign for this- today will serve as face to face A/P: Hospital follow-up for unwitnessed fall at home.  Sounds like this is related to weakness in his legs related to either orthopedic issues or his underlying neuropathy with chronic equilibrium issues.  Cannot firmly rule out syncope and discussed some repeat workup including cardiac monitoring, echocardiogram, MRI of the brain.  After discussion he would like to hold off on these and see what neurology recommends at upcoming visit - Continue PT/OT as seems to be making some progress  # CAD with chronic PVCs #hyperlipidemia # Hypertension # Congestive heart failure S: Medication: Atorvastatin  20 mg daily, Eliquis  2.5 mg twice daily-not on aspirin  as a result,Carvedilol  25 mg twice daily, Lasix20 mg daily along with potassium -Had been on amlodipine  in the past but no longer on  Lab Results  Component Value Date   CHOL 112 01/12/2024   HDL 33.40 (L) 01/12/2024   LDLCALC 61 01/12/2024   LDLDIRECT 145.4 06/27/2011   TRIG 87.0 01/12/2024   CHOLHDL 3 01/12/2024   A/P: CAD with history of PVCs-appears to be asymptomatic-no chest pain or shortness of breath reported.  Troponin trend was flat and doubt fall was related to CAD though would not rule out arrhythmia.  Remain on statin and Eliquis  alone-not on aspirin  - Lipids have been at goal but he is well overdue for repeat-update today and continue atorvastatin  -Congestive heart failure appears euvolemic-continue current medication -Hypertension controlled but upper limits of acceptable-continue current medication for now   # History of DVT/PE-patient on Eliquis  2.5 mg twice daily-no obvious  recurrence-this is maintaining/preventing recurrence   # CKD stage III-baseline creatinine around  1.4-1.5 and stable at discharge-update today  as well  # B12 deficiency S: Current treatment/medication (oral vs. IM): -Takes multivitamin with B12 Lab Results  Component Value Date   VITAMINB12 299 01/05/2024  A/P: B12 is low normal.  I prefer for this level to be over 400 if possible.  Reasonable to take 1000 mcg of B12/cyanocobalamin  over-the-counter daily to keep this above this goal.  Will plan on mentioning this at follow-up   # Hypokalemia-mildly decreased at discharge-recheck today.  Since hospitalization discharge has taken- 1 a day at home 10 meeq along with when he takes lasix - did need higher dose in hospital  Recommended follow up: Return in about 4 months (around 05/11/2024) for followup or sooner if needed.Schedule b4 you leave. Future Appointments  Date Time Provider Department Center  02/17/2024  2:20 PM [facility] ANNUAL WELLNESS VISIT 1 [facility]  03/03/2024 11:00 AM Rosemarie Eather RAMAN, MD [facility] None    Lab/Order associations:   ICD-10-CM   1. Fall, initial encounter  W19.XXXA     2. Coronary artery disease involving native coronary artery of native heart without angina pectoris  I25.10     3. Chronic diastolic CHF (congestive heart failure) (HCC)  I50.32     4. Idiopathic neuropathy  G60.9     5. Essential hypertension  I10     6. Stage 3 chronic kidney disease, unspecified whether stage 3a or 3b CKD (HCC)  N18.30     7. B12 deficiency  E53.8     8. Hyperlipidemia, unspecified hyperlipidemia type  E78.5 Comprehensive metabolic panel    CBC w/Diff    Lipid panel      No orders of the defined types were placed in this encounter.   Return precautions advised.  Garnette Lukes, MD

## 2024-01-12 NOTE — Telephone Encounter (Signed)
 Pt Bruce Ball on sleep lab phone asking to cancel/reschedule his appt with Dr. Pearlean Brownie for 03/03/24

## 2024-01-12 NOTE — Patient Instructions (Addendum)
 [facility]- Dr. Rosemarie Address: [address], [place], [place] [address] Phone: [phone]  Lets definitely keep neurology follow up and see if we can find any underlying answers. We discussed MRI but with one within 7 months we ended up waiting for neurology opinion.   B12 is low normal.  I prefer for this level to be over 400 if possible.  Reasonable to take 1000 mcg of B12/cyanocobalamin  over-the-counter daily to keep this above this goal.   Please stop by lab before you go If you have mychart- we will send your results within 3 business days of us  receiving them.  If you do not have mychart- we will call you about results within 5 business days of us  receiving them.  *please also note that you will see labs on mychart as soon as they post. I will later go in and write notes on them- will say notes from Dr. Katrinka   Recommended follow up: Return in about 4 months (around 05/11/2024) for followup or sooner if needed.Schedule b4 you leave.

## 2024-01-14 DIAGNOSIS — I129 Hypertensive chronic kidney disease with stage 1 through stage 4 chronic kidney disease, or unspecified chronic kidney disease: Secondary | ICD-10-CM | POA: Diagnosis not present

## 2024-01-14 DIAGNOSIS — G8929 Other chronic pain: Secondary | ICD-10-CM | POA: Diagnosis not present

## 2024-01-14 DIAGNOSIS — K219 Gastro-esophageal reflux disease without esophagitis: Secondary | ICD-10-CM | POA: Diagnosis not present

## 2024-01-14 DIAGNOSIS — N1831 Chronic kidney disease, stage 3a: Secondary | ICD-10-CM | POA: Diagnosis not present

## 2024-01-14 DIAGNOSIS — N529 Male erectile dysfunction, unspecified: Secondary | ICD-10-CM | POA: Diagnosis not present

## 2024-01-14 DIAGNOSIS — M1990 Unspecified osteoarthritis, unspecified site: Secondary | ICD-10-CM | POA: Diagnosis not present

## 2024-01-14 DIAGNOSIS — G250 Essential tremor: Secondary | ICD-10-CM | POA: Diagnosis not present

## 2024-01-14 DIAGNOSIS — M25551 Pain in right hip: Secondary | ICD-10-CM | POA: Diagnosis not present

## 2024-01-14 DIAGNOSIS — Z87442 Personal history of urinary calculi: Secondary | ICD-10-CM | POA: Diagnosis not present

## 2024-01-14 DIAGNOSIS — M5417 Radiculopathy, lumbosacral region: Secondary | ICD-10-CM | POA: Diagnosis not present

## 2024-01-14 DIAGNOSIS — D631 Anemia in chronic kidney disease: Secondary | ICD-10-CM | POA: Diagnosis not present

## 2024-01-14 DIAGNOSIS — I2510 Atherosclerotic heart disease of native coronary artery without angina pectoris: Secondary | ICD-10-CM | POA: Diagnosis not present

## 2024-01-14 DIAGNOSIS — Z7901 Long term (current) use of anticoagulants: Secondary | ICD-10-CM | POA: Diagnosis not present

## 2024-01-14 DIAGNOSIS — M810 Age-related osteoporosis without current pathological fracture: Secondary | ICD-10-CM | POA: Diagnosis not present

## 2024-01-14 DIAGNOSIS — G609 Hereditary and idiopathic neuropathy, unspecified: Secondary | ICD-10-CM | POA: Diagnosis not present

## 2024-01-14 DIAGNOSIS — E785 Hyperlipidemia, unspecified: Secondary | ICD-10-CM | POA: Diagnosis not present

## 2024-01-22 DIAGNOSIS — E785 Hyperlipidemia, unspecified: Secondary | ICD-10-CM | POA: Diagnosis not present

## 2024-01-22 DIAGNOSIS — D631 Anemia in chronic kidney disease: Secondary | ICD-10-CM | POA: Diagnosis not present

## 2024-01-22 DIAGNOSIS — M25551 Pain in right hip: Secondary | ICD-10-CM | POA: Diagnosis not present

## 2024-01-22 DIAGNOSIS — G250 Essential tremor: Secondary | ICD-10-CM | POA: Diagnosis not present

## 2024-01-22 DIAGNOSIS — N1831 Chronic kidney disease, stage 3a: Secondary | ICD-10-CM | POA: Diagnosis not present

## 2024-01-22 DIAGNOSIS — Z87442 Personal history of urinary calculi: Secondary | ICD-10-CM | POA: Diagnosis not present

## 2024-01-22 DIAGNOSIS — Z7901 Long term (current) use of anticoagulants: Secondary | ICD-10-CM | POA: Diagnosis not present

## 2024-01-22 DIAGNOSIS — M5417 Radiculopathy, lumbosacral region: Secondary | ICD-10-CM | POA: Diagnosis not present

## 2024-01-22 DIAGNOSIS — M810 Age-related osteoporosis without current pathological fracture: Secondary | ICD-10-CM | POA: Diagnosis not present

## 2024-01-22 DIAGNOSIS — M1990 Unspecified osteoarthritis, unspecified site: Secondary | ICD-10-CM | POA: Diagnosis not present

## 2024-01-22 DIAGNOSIS — K219 Gastro-esophageal reflux disease without esophagitis: Secondary | ICD-10-CM | POA: Diagnosis not present

## 2024-01-22 DIAGNOSIS — N529 Male erectile dysfunction, unspecified: Secondary | ICD-10-CM | POA: Diagnosis not present

## 2024-01-22 DIAGNOSIS — G609 Hereditary and idiopathic neuropathy, unspecified: Secondary | ICD-10-CM | POA: Diagnosis not present

## 2024-01-22 DIAGNOSIS — I2510 Atherosclerotic heart disease of native coronary artery without angina pectoris: Secondary | ICD-10-CM | POA: Diagnosis not present

## 2024-01-22 DIAGNOSIS — G8929 Other chronic pain: Secondary | ICD-10-CM | POA: Diagnosis not present

## 2024-01-22 DIAGNOSIS — I129 Hypertensive chronic kidney disease with stage 1 through stage 4 chronic kidney disease, or unspecified chronic kidney disease: Secondary | ICD-10-CM | POA: Diagnosis not present

## 2024-01-23 DIAGNOSIS — Z7901 Long term (current) use of anticoagulants: Secondary | ICD-10-CM | POA: Diagnosis not present

## 2024-01-23 DIAGNOSIS — G609 Hereditary and idiopathic neuropathy, unspecified: Secondary | ICD-10-CM | POA: Diagnosis not present

## 2024-01-23 DIAGNOSIS — G8929 Other chronic pain: Secondary | ICD-10-CM | POA: Diagnosis not present

## 2024-01-23 DIAGNOSIS — N1831 Chronic kidney disease, stage 3a: Secondary | ICD-10-CM | POA: Diagnosis not present

## 2024-01-23 DIAGNOSIS — I2510 Atherosclerotic heart disease of native coronary artery without angina pectoris: Secondary | ICD-10-CM | POA: Diagnosis not present

## 2024-01-23 DIAGNOSIS — K219 Gastro-esophageal reflux disease without esophagitis: Secondary | ICD-10-CM | POA: Diagnosis not present

## 2024-01-23 DIAGNOSIS — M1990 Unspecified osteoarthritis, unspecified site: Secondary | ICD-10-CM | POA: Diagnosis not present

## 2024-01-23 DIAGNOSIS — D631 Anemia in chronic kidney disease: Secondary | ICD-10-CM | POA: Diagnosis not present

## 2024-01-23 DIAGNOSIS — M5417 Radiculopathy, lumbosacral region: Secondary | ICD-10-CM | POA: Diagnosis not present

## 2024-01-23 DIAGNOSIS — E785 Hyperlipidemia, unspecified: Secondary | ICD-10-CM | POA: Diagnosis not present

## 2024-01-23 DIAGNOSIS — G250 Essential tremor: Secondary | ICD-10-CM | POA: Diagnosis not present

## 2024-01-23 DIAGNOSIS — Z87442 Personal history of urinary calculi: Secondary | ICD-10-CM | POA: Diagnosis not present

## 2024-01-23 DIAGNOSIS — I129 Hypertensive chronic kidney disease with stage 1 through stage 4 chronic kidney disease, or unspecified chronic kidney disease: Secondary | ICD-10-CM | POA: Diagnosis not present

## 2024-01-23 DIAGNOSIS — M810 Age-related osteoporosis without current pathological fracture: Secondary | ICD-10-CM | POA: Diagnosis not present

## 2024-01-23 DIAGNOSIS — M25551 Pain in right hip: Secondary | ICD-10-CM | POA: Diagnosis not present

## 2024-01-23 DIAGNOSIS — N529 Male erectile dysfunction, unspecified: Secondary | ICD-10-CM | POA: Diagnosis not present

## 2024-01-26 DIAGNOSIS — G250 Essential tremor: Secondary | ICD-10-CM | POA: Diagnosis not present

## 2024-01-26 DIAGNOSIS — N1831 Chronic kidney disease, stage 3a: Secondary | ICD-10-CM | POA: Diagnosis not present

## 2024-01-26 DIAGNOSIS — D631 Anemia in chronic kidney disease: Secondary | ICD-10-CM | POA: Diagnosis not present

## 2024-01-26 DIAGNOSIS — E785 Hyperlipidemia, unspecified: Secondary | ICD-10-CM | POA: Diagnosis not present

## 2024-01-26 DIAGNOSIS — I2510 Atherosclerotic heart disease of native coronary artery without angina pectoris: Secondary | ICD-10-CM | POA: Diagnosis not present

## 2024-01-26 DIAGNOSIS — G609 Hereditary and idiopathic neuropathy, unspecified: Secondary | ICD-10-CM | POA: Diagnosis not present

## 2024-01-26 DIAGNOSIS — M1990 Unspecified osteoarthritis, unspecified site: Secondary | ICD-10-CM | POA: Diagnosis not present

## 2024-01-26 DIAGNOSIS — N529 Male erectile dysfunction, unspecified: Secondary | ICD-10-CM | POA: Diagnosis not present

## 2024-01-26 DIAGNOSIS — K219 Gastro-esophageal reflux disease without esophagitis: Secondary | ICD-10-CM | POA: Diagnosis not present

## 2024-01-26 DIAGNOSIS — M25551 Pain in right hip: Secondary | ICD-10-CM | POA: Diagnosis not present

## 2024-01-26 DIAGNOSIS — M810 Age-related osteoporosis without current pathological fracture: Secondary | ICD-10-CM | POA: Diagnosis not present

## 2024-01-26 DIAGNOSIS — Z87442 Personal history of urinary calculi: Secondary | ICD-10-CM | POA: Diagnosis not present

## 2024-01-26 DIAGNOSIS — I129 Hypertensive chronic kidney disease with stage 1 through stage 4 chronic kidney disease, or unspecified chronic kidney disease: Secondary | ICD-10-CM | POA: Diagnosis not present

## 2024-01-26 DIAGNOSIS — G8929 Other chronic pain: Secondary | ICD-10-CM | POA: Diagnosis not present

## 2024-01-26 DIAGNOSIS — M5417 Radiculopathy, lumbosacral region: Secondary | ICD-10-CM | POA: Diagnosis not present

## 2024-01-26 DIAGNOSIS — Z7901 Long term (current) use of anticoagulants: Secondary | ICD-10-CM | POA: Diagnosis not present

## 2024-01-27 ENCOUNTER — Telehealth: Payer: Self-pay | Admitting: Family Medicine

## 2024-01-27 DIAGNOSIS — M1711 Unilateral primary osteoarthritis, right knee: Secondary | ICD-10-CM | POA: Diagnosis not present

## 2024-01-27 DIAGNOSIS — R2689 Other abnormalities of gait and mobility: Secondary | ICD-10-CM

## 2024-01-27 DIAGNOSIS — R531 Weakness: Secondary | ICD-10-CM

## 2024-01-27 DIAGNOSIS — C419 Malignant neoplasm of bone and articular cartilage, unspecified: Secondary | ICD-10-CM | POA: Diagnosis not present

## 2024-01-27 DIAGNOSIS — M25551 Pain in right hip: Secondary | ICD-10-CM | POA: Diagnosis not present

## 2024-01-27 NOTE — Telephone Encounter (Signed)
Received faxed document Home Health Certificate (Order ID [id number]), to be filled out by provider. Patient requested to send it back via Fax .Document is located in providers tray at front office.Please advise .

## 2024-01-28 DIAGNOSIS — G609 Hereditary and idiopathic neuropathy, unspecified: Secondary | ICD-10-CM | POA: Diagnosis not present

## 2024-01-28 DIAGNOSIS — G8929 Other chronic pain: Secondary | ICD-10-CM | POA: Diagnosis not present

## 2024-01-28 DIAGNOSIS — Z87442 Personal history of urinary calculi: Secondary | ICD-10-CM | POA: Diagnosis not present

## 2024-01-28 DIAGNOSIS — M810 Age-related osteoporosis without current pathological fracture: Secondary | ICD-10-CM | POA: Diagnosis not present

## 2024-01-28 DIAGNOSIS — I2510 Atherosclerotic heart disease of native coronary artery without angina pectoris: Secondary | ICD-10-CM | POA: Diagnosis not present

## 2024-01-28 DIAGNOSIS — M25551 Pain in right hip: Secondary | ICD-10-CM | POA: Diagnosis not present

## 2024-01-28 DIAGNOSIS — D631 Anemia in chronic kidney disease: Secondary | ICD-10-CM | POA: Diagnosis not present

## 2024-01-28 DIAGNOSIS — G250 Essential tremor: Secondary | ICD-10-CM | POA: Diagnosis not present

## 2024-01-28 DIAGNOSIS — N529 Male erectile dysfunction, unspecified: Secondary | ICD-10-CM | POA: Diagnosis not present

## 2024-01-28 DIAGNOSIS — E785 Hyperlipidemia, unspecified: Secondary | ICD-10-CM | POA: Diagnosis not present

## 2024-01-28 DIAGNOSIS — K219 Gastro-esophageal reflux disease without esophagitis: Secondary | ICD-10-CM | POA: Diagnosis not present

## 2024-01-28 DIAGNOSIS — N1831 Chronic kidney disease, stage 3a: Secondary | ICD-10-CM | POA: Diagnosis not present

## 2024-01-28 DIAGNOSIS — M5417 Radiculopathy, lumbosacral region: Secondary | ICD-10-CM | POA: Diagnosis not present

## 2024-01-28 DIAGNOSIS — Z7901 Long term (current) use of anticoagulants: Secondary | ICD-10-CM | POA: Diagnosis not present

## 2024-01-28 DIAGNOSIS — I129 Hypertensive chronic kidney disease with stage 1 through stage 4 chronic kidney disease, or unspecified chronic kidney disease: Secondary | ICD-10-CM | POA: Diagnosis not present

## 2024-01-28 DIAGNOSIS — M1990 Unspecified osteoarthritis, unspecified site: Secondary | ICD-10-CM | POA: Diagnosis not present

## 2024-01-28 NOTE — Telephone Encounter (Signed)
Form faxed back.

## 2024-02-02 DIAGNOSIS — G250 Essential tremor: Secondary | ICD-10-CM | POA: Diagnosis not present

## 2024-02-02 DIAGNOSIS — Z87442 Personal history of urinary calculi: Secondary | ICD-10-CM | POA: Diagnosis not present

## 2024-02-02 DIAGNOSIS — I129 Hypertensive chronic kidney disease with stage 1 through stage 4 chronic kidney disease, or unspecified chronic kidney disease: Secondary | ICD-10-CM | POA: Diagnosis not present

## 2024-02-02 DIAGNOSIS — Z7901 Long term (current) use of anticoagulants: Secondary | ICD-10-CM | POA: Diagnosis not present

## 2024-02-02 DIAGNOSIS — M810 Age-related osteoporosis without current pathological fracture: Secondary | ICD-10-CM | POA: Diagnosis not present

## 2024-02-02 DIAGNOSIS — G8929 Other chronic pain: Secondary | ICD-10-CM | POA: Diagnosis not present

## 2024-02-02 DIAGNOSIS — M1990 Unspecified osteoarthritis, unspecified site: Secondary | ICD-10-CM | POA: Diagnosis not present

## 2024-02-02 DIAGNOSIS — E785 Hyperlipidemia, unspecified: Secondary | ICD-10-CM | POA: Diagnosis not present

## 2024-02-02 DIAGNOSIS — M5417 Radiculopathy, lumbosacral region: Secondary | ICD-10-CM | POA: Diagnosis not present

## 2024-02-02 DIAGNOSIS — M25551 Pain in right hip: Secondary | ICD-10-CM | POA: Diagnosis not present

## 2024-02-02 DIAGNOSIS — D631 Anemia in chronic kidney disease: Secondary | ICD-10-CM | POA: Diagnosis not present

## 2024-02-02 DIAGNOSIS — I2510 Atherosclerotic heart disease of native coronary artery without angina pectoris: Secondary | ICD-10-CM | POA: Diagnosis not present

## 2024-02-02 DIAGNOSIS — K219 Gastro-esophageal reflux disease without esophagitis: Secondary | ICD-10-CM | POA: Diagnosis not present

## 2024-02-02 DIAGNOSIS — N1831 Chronic kidney disease, stage 3a: Secondary | ICD-10-CM | POA: Diagnosis not present

## 2024-02-02 DIAGNOSIS — G609 Hereditary and idiopathic neuropathy, unspecified: Secondary | ICD-10-CM | POA: Diagnosis not present

## 2024-02-02 DIAGNOSIS — N529 Male erectile dysfunction, unspecified: Secondary | ICD-10-CM | POA: Diagnosis not present

## 2024-02-03 NOTE — Telephone Encounter (Signed)
 Reported ongoing balance issues and generalized weakness- did well with physical therapy in the past- pending referral to pain clinic at [facility]- requests while waiting for 2 months to retrial physical therapy- we will refer at this time- patient/wife will call

## 2024-02-17 ENCOUNTER — Ambulatory Visit (INDEPENDENT_AMBULATORY_CARE_PROVIDER_SITE_OTHER): Payer: Medicare Other

## 2024-02-17 VITALS — Ht 71.0 in | Wt 247.0 lb

## 2024-02-17 DIAGNOSIS — Z0000 Encounter for general adult medical examination without abnormal findings: Secondary | ICD-10-CM

## 2024-02-17 NOTE — Patient Instructions (Signed)
Bruce Ball , Thank you for taking time to come for your Medicare Wellness Visit. I appreciate your ongoing commitment to your health goals. Please review the following plan we discussed and let me know if I can assist you in the future.   Referrals/Orders/Follow-Ups/Clinician Recommendations: maintain health and activity   This is a list of the screening recommended for you and due dates:  Health Maintenance  Topic Date Due   Zoster (Shingles) Vaccine (2 of 2) 01/23/2021   COVID-19 Vaccine (10 - 2024-25 season) 11/18/2023   Medicare Annual Wellness Visit  02/05/2024   Pneumonia Vaccine (2 of 2 - PPSV23 or PCV20) 04/02/2024*   DTaP/Tdap/Td vaccine (5 - Td or Tdap) 02/13/2026   Colon Cancer Screening  09/26/2026   Flu Shot  Completed   Hepatitis C Screening  Completed   HPV Vaccine  Aged Out  *Topic was postponed. The date shown is not the original due date.    Advanced directives: (Copy Requested) Please bring a copy of your health care power of attorney and living will to the office to be added to your chart at your convenience.  Next Medicare Annual Wellness Visit scheduled for next year: Yes ed

## 2024-02-17 NOTE — Progress Notes (Signed)
Subjective:   Bruce Ball is a 78 y.o. male who presents for Medicare Annual/Subsequent preventive examination.  Visit Complete: Virtual I connected with  Bruce Ball on 02/17/24 by a audio enabled telemedicine application and verified that I am speaking with the correct person using two identifiers.  Patient Location: Home  Provider Location: Office/Clinic  I discussed the limitations of evaluation and management by telemedicine. The patient expressed understanding and agreed to proceed.  Vital Signs: Because this visit was a virtual/telehealth visit, some criteria may be missing or patient reported. Any vitals not documented were not able to be obtained and vitals that have been documented are patient reported.   Cardiac Risk Factors include: advanced age (>66men, >56 women);dyslipidemia;male gender;hypertension;obesity (BMI >30kg/m2)     Objective:    Today's Vitals   02/17/24 1422  Weight: 247 lb (112 kg)  Height: 5\' 11"  (1.803 m)   Body mass index is 34.45 kg/m.     02/17/2024    2:28 PM 01/05/2024    2:40 AM 05/16/2023    8:48 AM 05/03/2023    7:14 AM 02/04/2023    2:13 PM 11/13/2022   10:02 AM 11/11/2022    5:40 AM  Advanced Directives  Does Patient Have a Medical Advance Directive? Yes No No Yes Yes No No  Type of Estate agent of [place];Living will   Healthcare Power of [place];Living will Healthcare Power of [place];Living will    Copy of Healthcare Power of Attorney in Chart? No - copy requested    No - copy requested    Would patient like information on creating a medical advance directive? No - Patient declined     No - Patient declined     Current Medications (verified) Outpatient Encounter Medications as of 02/17/2024  Medication Sig   acetaminophen (TYLENOL) 500 MG tablet Take 2 tablets (1,000 mg total) by mouth every 6 (six) hours as needed for headache, fever, moderate pain (pain score 4-6) or mild pain (pain score 1-3).    apixaban (ELIQUIS) 2.5 MG TABS tablet Take 1 tablet (2.5 mg total) by mouth 2 (two) times daily.   atorvastatin (LIPITOR) 20 MG tablet Take 1 tablet (20 mg total) by mouth at bedtime. (Patient taking differently: Take 20 mg by mouth at bedtime.)   Budesonide 2 MG/10ML SUSP Take by mouth. 1/2 twice a day   carvedilol (COREG) 25 MG tablet Take 25 mg by mouth 2 (two) times daily with a meal.   cetirizine (ZYRTEC) 10 MG tablet Take 10 mg by mouth as needed for allergies.   Cholecalciferol (VITAMIN D3) 50 MCG (2000 UT) capsule Take 2,000 Units by mouth in the morning and at bedtime.    fluticasone (FLONASE) 50 MCG/ACT nasal spray Place 1-2 sprays into both nostrils daily as needed for allergies or rhinitis.   furosemide (LASIX) 20 MG tablet Take 20 mg by mouth daily as needed for fluid (if the feet and/or legs swell).   HYDROmorphone (DILAUDID) 4 MG tablet Take 0.5 tablets (2 mg total) by mouth every 12 (twelve) hours as needed for severe pain (pain score 7-10).   methocarbamol (ROBAXIN) 500 MG tablet Take 1 tablet (500 mg total) by mouth every 8 (eight) hours as needed for muscle spasms.   Multiple Vitamin (MULTIVITAMIN) capsule Take 1 capsule by mouth daily.   pantoprazole (PROTONIX) 40 MG tablet Take 40 mg by mouth 2 (two) times daily before a meal.   potassium citrate (UROCIT-K) 10 MEQ (1080 MG) SR  tablet Take 1 tablet (10 mEq total) by mouth as directed.   No facility-administered encounter medications on file as of 02/17/2024.    Allergies (verified) Tape, Erythromycin, and Lisinopril   History: Past Medical History:  Diagnosis Date   Anemia associated with chronic renal failure    At high risk for falls    due to gait disorders;   recurrent falls   (05-12-2023  per pt last fall 2 weeks ago)   Chronic anticoagulation    eliquis--- managed by pcp   Coronary artery calcification seen on CAT scan    followed by dr t. turner---   05-31-2021  calcium score= 152   Deafness in right ear    ED  (erectile dysfunction)    Edema of both lower extremities    Essential tremor    Gait disorder    imbalance when walking uses cane/ walker;  recurrent falls   GERD (gastroesophageal reflux disease)    Hematuria    Hiatal hernia    History of adenomatous polyp of colon    History of agent Orange exposure    History of basal cell carcinoma (BCC) excision    History of DVT of lower extremity 12/2019   admission in epic ;   w/ PE's all lobes   History of esophageal dilatation 12/2019   dr Adela Lank   History of kidney stones    History of peptic ulcer 2009   History of prostate cancer 12/2001   followed by dr Berneice Heinrich;   first dx 01/ 2003,  Gleason 3+3,   03-10-2002  s/p  radical prostatectomy pelvic lymph node disections , no recurrence   History of pulmonary embolus (PE) 12/2019   admission in epic;   submassive all lobes and dvt -lle   History of sarcoma of bone 1970   orthopedic oncologist--- dr w. 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 turner;  monitor in epic 04-08-2023 low PVC burden and atrial ectopy   Renal calculi    bilateral   Stage 3 chronic kidney disease [facility])    nephrologist--- dr Ronalee Belts   Thyroid nodule 2016   endocrinologist--- dr Dewitt Hoes  [facility])  hx benign bx, right side   Urinary incontinence    total incontinence   Wears hearing aid in left ear    Past Surgical History:  Procedure Laterality Date   CYSTOSCOPY WITH RETROGRADE  PYELOGRAM, URETEROSCOPY AND STENT PLACEMENT Right 11/06/2017   Procedure: CYSTOSCOPY WITH  STENT PLACEMENT RIGHT;  Surgeon: Heloise Purpura, MD;  Location: [facility];  Service: Urology;  Laterality: Right;   CYSTOSCOPY WITH RETROGRADE PYELOGRAM, URETEROSCOPY AND STENT PLACEMENT Left 06/09/2019   Procedure: CYSTOSCOPY WITH RETROGRADE PYELOGRAM, URETEROSCOPY AND STENT PLACEMENT X2;  Surgeon: Sebastian Ache, MD;  Location: [facility];  Service: Urology;  Laterality: Left;   CYSTOSCOPY WITH RETROGRADE PYELOGRAM, URETEROSCOPY AND STENT PLACEMENT Bilateral 03/01/2020   Procedure: CYSTOSCOPY WITH RETROGRADE PYELOGRAM, URETEROSCOPY AND STENT PLACEMENT;  Surgeon: Sebastian Ache, MD;  Location: [facility];  Service: Urology;  Laterality: Bilateral;  75 MINS   CYSTOSCOPY WITH RETROGRADE PYELOGRAM, URETEROSCOPY AND STENT PLACEMENT Bilateral 05/16/2023   Procedure: CYSTOSCOPY WITH RETROGRADE PYELOGRAM, URETEROSCOPY AND STENT PLACEMENT;  Surgeon: Loletta Parish., MD;  Location: [facility];  Service: Urology;  Laterality: Bilateral;  90 MINS   CYSTOSCOPY/RETROGRADE/URETEROSCOPY  2020   at [facility] in [place]   CYSTOSCOPY/URETEROSCOPY/HOLMIUM LASER Right 11/24/2017   Procedure: CYSTOSCOPY/URETEROSCOPY/ RETROGRADE/STENT REMOVAL;  Surgeon: Heloise Purpura, MD;  Location: [facility];  Service: Urology;  Laterality: Right;   FEMORAL OSTEOTOMY W/ RODDING  1970   right proximal femUr resection replacement w/ custom prosthesis   FEMUR FRACTURE SURGERY Right 10/17/2021   @ [facility] by dr w. eward;  REVISION RIGHT PROXIMAL FEMUR REPLACEMENT IN SETTING OF DISTAL PERIPROSTHETIC FEMUR FRACTURE, BOTH COMPONENTS   FUNCTIONAL ENDOSCOPIC SINUS SURGERY  03/19/2023   @ [facility]   HOLMIUM LASER APPLICATION Bilateral 03/01/2020   Procedure: HOLMIUM LASER APPLICATION;  Surgeon: Sebastian Ache, MD;  Location: [facility];  Service: Urology;  Laterality: Bilateral;   HOLMIUM LASER APPLICATION Bilateral 05/16/2023   Procedure: HOLMIUM LASER  APPLICATION;  Surgeon: Loletta Parish., MD;  Location: [facility];  Service: Urology;  Laterality: Bilateral;   LUMBAR SPINE SURGERY  07/2017   @ [facility]  by dr Newell Coral;    L5-- S1  laminectomy   POSTERIOR LUMBAR FUSION  06/28/2020   @ [facility] [place];   L5--S1   PROSTATECTOMY  03/10/2002   @[facility]  by dr Aldean Ast;    w/ pelvic lymph node dissection's   SPINAL CORD STIMULATOR REMOVAL  04/15/2016   W/   THORACIC PARTIAL LAMINECTOMY T9   Family History  Problem Relation Age of Onset   COPD Father    Hypertension Father    Prostate cancer Brother    Stomach cancer Brother        on chemo in 2022- in 47s developed   Depression Brother        suicide after brain stem injury after accident   Stroke Brother        82 years old   Esophageal cancer Neg Hx    Pancreatic cancer Neg Hx    Colon cancer Neg Hx    Social History   Socioeconomic History   Marital status: Married    Spouse name: Not on file   Number of children: 2   Years of education: Not on file   Highest education level: Not on file  Occupational History   Occupation: Retired Airline pilot  Tobacco Use   Smoking status: Former    Current packs/day: 0.00    Average packs/day: 1 pack/day for 20.0 years (20.0 ttl pk-yrs)    Types: Cigarettes    Start date: 1963    Quit date: 1980    Years since quitting: 45.1   Smokeless tobacco: Never  Vaping Use   Vaping status: Never Used  Substance and Sexual Activity   Alcohol use: No   Drug use: Never   Sexual activity: Not on file  Other Topics Concern   Not on file  Social History Narrative   ** Merged History Encounter **       Social Drivers of Health   Financial Resource Strain: Low Risk  (02/17/2024)   Overall Financial Resource Strain (CARDIA)    Difficulty of Paying Living Expenses: Not hard at all  Food Insecurity: No Food Insecurity (02/17/2024)  Hunger Vital Sign    Worried About Running Out of Food in the Last  Year: Never true    Ran Out of Food in the Last Year: Never true  Transportation Needs: No Transportation Needs (02/17/2024)   PRAPARE - Administrator, Civil Service (Medical): No    Lack of Transportation (Non-Medical): No  Physical Activity: Sufficiently Active (02/17/2024)   Exercise Vital Sign    Days of Exercise per Week: 3 days    Minutes of Exercise per Session: 60 min  Stress: No Stress Concern Present (02/17/2024)   [employer] of Occupational Health - Occupational Stress Questionnaire    Feeling of Stress : Not at all  Social Connections: Moderately Isolated (02/17/2024)   Social Connection and Isolation Panel [NHANES]    Frequency of Communication with Friends and Family: More than three times a week    Frequency of Social Gatherings with Friends and Family: More than three times a week    Attends Religious Services: Never    Database administrator or Organizations: No    Attends Engineer, structural: Never    Marital Status: Married    Tobacco Counseling Counseling given: Not Answered   Clinical Intake:  Pre-visit preparation completed: Yes  Pain : No/denies pain     BMI - recorded: 34.45 Nutritional Status: BMI > 30  Obese Nutritional Risks: None Diabetes: No  How often do you need to have someone help you when you read instructions, pamphlets, or other written materials from your doctor or pharmacy?: 1 - Never  Interpreter Needed?: No  Information entered by :: Lanier Ensign, LPN   Activities of Daily Living    02/17/2024    2:25 PM 01/05/2024   10:54 AM  In your present state of health, do you have any difficulty performing the following activities:  Hearing? 1 1  Comment heaRING aids   Vision? 0 1  Difficulty concentrating or making decisions? 1 1  Walking or climbing stairs? 1   Comment uses walker cane and wheelchairs at times   Dressing or bathing? 0   Doing errands, shopping? 0 1  Preparing Food and eating ? N    Using the Toilet? N   In the past six months, have you accidently leaked urine? Y   Comment wears depends   Do you have problems with loss of bowel control? N   Managing your Medications? N   Managing your Finances? N   Housekeeping or managing your Housekeeping? N     Patient Care Team: Shelva Majestic, MD as PCP - General (Family Medicine) Quintella Reichert, MD as PCP - Cardiology (Cardiology) Mealor, Roberts Gaudy, MD as PCP - Electrophysiology (Cardiology) Pa, [facility] as Consulting Physician Shirlean Kelly, MD as Consulting Physician (Neurosurgery) Clinic, [facility] as Consulting Physician Manny, Delbert Phenix., MD as Consulting Physician (Urology) Sedalia Muta, PT as Physical Therapist (Physical Therapy) Shelva Majestic, MD (Family Medicine) Erroll Luna, [facility] (Inactive) (Pharmacist)  Indicate any recent Medical Services you may have received from other than [facility] providers in the past year (date may be approximate).     Assessment:   This is a routine wellness examination for [place].  Hearing/Vision screen Hearing Screening - Comments:: Pt wears hearing aids  V[facility] Screening - Comments:: [facility]    Goals Addressed   None    Depression Screen    02/17/2024    2:29 PM  01/12/2024    9:11 AM 02/04/2023    2:14 PM 10/28/2022    2:02 PM 09/16/2022   11:15 AM 01/25/2022   11:07 AM 08/28/2021    8:25 AM  PHQ 2/9 Scores  PHQ - 2 Score 0 4 0 0 0 0 0  PHQ- 9 Score 0 18         Fall Risk    02/17/2024    2:30 PM 01/12/2024    9:11 AM 01/31/2023    8:48 AM 09/16/2022   11:15 AM 01/25/2022   11:09 AM  Fall Risk   Falls in the past year? 1 1 1 1 1   Number falls in past yr: 1 1 1 1 1   Injury with Fall? 1 0 1 1 1   Comment had to go to ER  followed up at hospital  right femur  Risk for fall due to : History of fall(s);Impaired balance/gait No Fall Risks History of fall(s);Impaired balance/gait;Impaired  mobility;Impaired vision Other (Comment) Impaired balance/gait;Impaired mobility;History of fall(s)  Risk for fall due to: Comment    Larey Seat out of bed twice and fell in bathroom.   Follow up Falls prevention discussed Falls evaluation completed Falls prevention discussed Falls evaluation completed Falls prevention discussed    MEDICARE RISK AT HOME: Medicare Risk at Home Any stairs in or around the home?: Yes If so, are there any without handrails?: No Home free of loose throw rugs in walkways, pet beds, electrical cords, etc?: Yes Adequate lighting in your home to reduce risk of falls?: Yes Life alert?: Yes Use of a cane, walker or w/c?: Yes Grab bars in the bathroom?: Yes Shower chair or bench in shower?: Yes Elevated toilet seat or a handicapped toilet?: Yes  TIMED UP AND GO:  Was the test performed?  No    Cognitive Function:        02/17/2024    2:32 PM 02/04/2023    2:19 PM 11/29/2019    3:57 PM  6CIT Screen  What Year? 0 points 0 points 0 points  What month? 0 points 0 points 0 points  What time? 0 points 0 points 0 points  Count back from 20 0 points 0 points 0 points  Months in reverse 0 points 0 points 0 points  Repeat phrase 4 points 0 points 4 points  Total Score 4 points 0 points 4 points    Immunizations Immunization History  Administered Date(s) Administered   Fluad Quad(high Dose 65+) 10/07/2022   Influenza Split 12/30/2013, 11/14/2015, 12/13/2015, 09/16/2018   Influenza, High Dose Seasonal PF 09/15/2012, 11/06/2016, 11/26/2017, 09/16/2018, 10/01/2019, 10/14/2019, 10/02/2020, 09/28/2021   Influenza-Unspecified 12/13/2015, 09/16/2018, 09/23/2023   Moderna Covid-19 Fall Seasonal Vaccine 60yrs & older 09/23/2023   PFIZER(Purple Top)SARS-COV-2 Vaccination 12/31/2019, 01/21/2020, 02/11/2020, 10/02/2020, 04/13/2021   Pfizer Covid-19 Vaccine Bivalent Booster 24yrs & up 09/28/2021   Pneumococcal Conjugate-13 09/15/2012, 05/19/2014   Pneumococcal-Unspecified  09/15/2012, 05/19/2014   Rsv, Bivalent, Protein Subunit Rsvpref,pf Verdis Frederickson) 11/07/2022   Td 03/23/2015   Tdap 08/30/2005, 03/23/2015, 02/14/2016   Zoster Recombinant(Shingrix) 11/28/2020   Zoster, Live 11/19/2013    TDAP status: Up to date  Flu Vaccine status: Up to date  Pneumococcal vaccine status: Up to date  Covid-19 vaccine status: Information provided on how to obtain vaccines.   Qualifies for Shingles Vaccine? Yes   Zostavax completed Yes   Shingrix Completed?: No.    Education has been provided regarding the importance of this vaccine. Patient has been advised to call insurance company to determine  out of pocket expense if they have not yet received this vaccine. Advised may also receive vaccine at local pharmacy or Health Dept. Verbalized acceptance and understanding.  Screening Tests Health Maintenance  Topic Date Due   Zoster Vaccines- Shingrix (2 of 2) 01/23/2021   COVID-19 Vaccine (8 - 2024-25 season) 11/18/2023   Pneumonia Vaccine 38+ Years old (2 of 2 - PPSV23 or PCV20) 04/02/2024 (Originally 07/14/2014)   Medicare Annual Wellness (AWV)  02/16/2025   DTaP/Tdap/Td (5 - Td or Tdap) 02/13/2026   Colonoscopy  09/26/2026   INFLUENZA VACCINE  Completed   Hepatitis C Screening  Completed   HPV VACCINES  Aged Out    Health Maintenance  Health Maintenance Due  Topic Date Due   Zoster Vaccines- Shingrix (2 of 2) 01/23/2021   COVID-19 Vaccine (8 - 2024-25 season) 11/18/2023    Colorectal cancer screening: Type of screening: Colonoscopy. Completed 09/26/21. Repeat every 5 years  Additional Screening:  Hepatitis C Screening:  Completed 07/24/16  Vision Screening: Recommended annual ophthalmology exams for early detection of glaucoma and other disorders of the eye. Is the patient up to date with their annual eye exam?  Yes  Who is the provider or what is the name of the office in which the patient attends annual [facility] exams[facility]  If pt is not established  with a provider, would they like to be referred to a provider to establish care? No .   Dental Screening: Recommended annual dental exams for proper oral hygiene   Community Resource Referral / Chronic Care Management: CRR required this visit?  No   CCM required this visit?  No     Plan:     I have personally reviewed and noted the following in the patient's chart:   Medical and social history Use of alcohol, tobacco or illicit drugs  Current medications and supplements including opioid prescriptions. Patient is currently taking opioid prescriptions. Information provided to patient regarding non-opioid alternatives. Patient advised to discuss non-opioid treatment plan with their provider. Functional ability and status Nutritional status Physical activity Advanced directives List of other physicians Hospitalizations, surgeries, and ER visits in previous 12 months Vitals Screenings to include cognitive, depression, and falls Referrals and appointments  In addition, I have reviewed and discussed with patient certain preventive protocols, quality metrics, and best practice recommendations. A written personalized care plan for preventive services as well as general preventive health recommendations were provided to patient.     Marzella Schlein, LPN   [id number]   After Visit Summary: (MyChart) Due to this being a telephonic visit, the after visit summary with patients personalized plan was offered to patient via MyChart   Nurse Notes: none

## 2024-02-18 DIAGNOSIS — I129 Hypertensive chronic kidney disease with stage 1 through stage 4 chronic kidney disease, or unspecified chronic kidney disease: Secondary | ICD-10-CM | POA: Diagnosis not present

## 2024-02-18 DIAGNOSIS — I130 Hypertensive heart and chronic kidney disease with heart failure and stage 1 through stage 4 chronic kidney disease, or unspecified chronic kidney disease: Secondary | ICD-10-CM | POA: Diagnosis not present

## 2024-02-18 DIAGNOSIS — N1832 Chronic kidney disease, stage 3b: Secondary | ICD-10-CM | POA: Diagnosis not present

## 2024-02-18 DIAGNOSIS — R809 Proteinuria, unspecified: Secondary | ICD-10-CM | POA: Diagnosis not present

## 2024-02-18 DIAGNOSIS — D631 Anemia in chronic kidney disease: Secondary | ICD-10-CM | POA: Diagnosis not present

## 2024-02-18 DIAGNOSIS — N189 Chronic kidney disease, unspecified: Secondary | ICD-10-CM | POA: Diagnosis not present

## 2024-03-03 ENCOUNTER — Institutional Professional Consult (permissible substitution): Payer: Medicare Other | Admitting: Neurology

## 2024-03-11 ENCOUNTER — Other Ambulatory Visit: Payer: Self-pay

## 2024-03-11 ENCOUNTER — Encounter (HOSPITAL_BASED_OUTPATIENT_CLINIC_OR_DEPARTMENT_OTHER): Payer: Self-pay | Admitting: Physical Therapy

## 2024-03-11 ENCOUNTER — Ambulatory Visit (HOSPITAL_BASED_OUTPATIENT_CLINIC_OR_DEPARTMENT_OTHER): Payer: Medicare Other | Attending: Family Medicine | Admitting: Physical Therapy

## 2024-03-11 DIAGNOSIS — M6281 Muscle weakness (generalized): Secondary | ICD-10-CM | POA: Diagnosis not present

## 2024-03-11 DIAGNOSIS — R531 Weakness: Secondary | ICD-10-CM | POA: Insufficient documentation

## 2024-03-11 DIAGNOSIS — R2689 Other abnormalities of gait and mobility: Secondary | ICD-10-CM | POA: Diagnosis not present

## 2024-03-11 NOTE — Therapy (Signed)
 OUTPATIENT PHYSICAL THERAPY EVALUATION   Patient Name: Bruce Ball MRN: [id number] DOB:14-Sep-1946, 78 y.o., male Today's Date: 03/11/2024  END OF SESSION:  PT End of Session - 03/11/24 1515     Visit Number 1    Number of Visits 17    Date for PT Re-Evaluation 05/06/24    Authorization Type [employer] medicare    Progress Note Due on Visit 10    PT Start Time 1516    PT Stop Time 1558    PT Time Calculation (min) 42 min    Activity Tolerance Patient tolerated treatment well             Past Medical History:  Diagnosis Date   Anemia associated with chronic renal failure    At high risk for falls    due to gait disorders;   recurrent falls   (05-12-2023  per pt last fall 2 weeks ago)   Chronic anticoagulation    eliquis--- managed by pcp   Coronary artery calcification seen on CAT scan    followed by dr t. turner---   05-31-2021  calcium score= 152   Deafness in right ear    ED (erectile dysfunction)    Edema of both lower extremities    Essential tremor    Gait disorder    imbalance when walking uses cane/ walker;  recurrent falls   GERD (gastroesophageal reflux disease)    Hematuria    Hiatal hernia    History of adenomatous polyp of colon    History of agent Orange exposure    History of basal cell carcinoma (BCC) excision    History of DVT of lower extremity 12/2019   admission in epic ;   w/ PE's all lobes   History of esophageal dilatation 12/2019   dr Adela Lank   History of kidney stones    History of peptic ulcer 2009   History of prostate cancer 12/2001   followed by dr Berneice Heinrich;   first dx 01/ 2003,  Gleason 3+3,   03-10-2002  s/p  radical prostatectomy pelvic lymph node disections , no recurrence   History of pulmonary embolus (PE) 12/2019   admission in epic;   submassive all lobes and dvt -lle   History of sarcoma of bone 1970   orthopedic oncologist--- dr w. 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 turner;  monitor in epic 04-08-2023 low PVC burden and atrial ectopy   Renal calculi    bilateral   Stage 3 chronic kidney disease [facility])    nephrologist--- dr Ronalee Belts   Thyroid nodule 2016   endocrinologist--- dr Dewitt Hoes  [facility])  hx benign bx, right side   Urinary incontinence    total incontinence   Wears hearing aid in left ear    Past Surgical History:  Procedure Laterality Date   CYSTOSCOPY WITH RETROGRADE PYELOGRAM, URETEROSCOPY AND STENT PLACEMENT Right 11/06/2017   Procedure: CYSTOSCOPY WITH  STENT PLACEMENT  RIGHT;  Surgeon: Heloise Purpura, MD;  Location: [facility];  Service: Urology;  Laterality: Right;   CYSTOSCOPY WITH RETROGRADE PYELOGRAM, URETEROSCOPY AND STENT PLACEMENT Left 06/09/2019   Procedure: CYSTOSCOPY WITH RETROGRADE PYELOGRAM, URETEROSCOPY AND STENT PLACEMENT X2;  Surgeon: Sebastian Ache, MD;  Location: [facility];  Service: Urology;  Laterality: Left;   CYSTOSCOPY WITH RETROGRADE PYELOGRAM, URETEROSCOPY AND STENT PLACEMENT Bilateral 03/01/2020   Procedure: CYSTOSCOPY WITH RETROGRADE PYELOGRAM, URETEROSCOPY AND STENT PLACEMENT;  Surgeon: Sebastian Ache, MD;  Location: [facility];  Service: Urology;  Laterality: Bilateral;  75 MINS   CYSTOSCOPY WITH RETROGRADE PYELOGRAM, URETEROSCOPY AND STENT PLACEMENT Bilateral 05/16/2023   Procedure: CYSTOSCOPY WITH RETROGRADE PYELOGRAM, URETEROSCOPY AND STENT PLACEMENT;  Surgeon: Loletta Parish., MD;  Location: [facility];  Service: Urology;   Laterality: Bilateral;  90 MINS   CYSTOSCOPY/RETROGRADE/URETEROSCOPY  2020   at [facility] in [place]   CYSTOSCOPY/URETEROSCOPY/HOLMIUM LASER Right 11/24/2017   Procedure: CYSTOSCOPY/URETEROSCOPY/ RETROGRADE/STENT REMOVAL;  Surgeon: Heloise Purpura, MD;  Location: [facility];  Service: Urology;  Laterality: Right;   FEMORAL OSTEOTOMY W/ RODDING  1970   right proximal femUr resection replacement w/ custom prosthesis   FEMUR FRACTURE SURGERY Right 10/17/2021   @ [facility] by dr w. eward;  REVISION RIGHT PROXIMAL FEMUR REPLACEMENT IN SETTING OF DISTAL PERIPROSTHETIC FEMUR FRACTURE, BOTH COMPONENTS   FUNCTIONAL ENDOSCOPIC SINUS SURGERY  03/19/2023   @ [facility]   HOLMIUM LASER APPLICATION Bilateral 03/01/2020   Procedure: HOLMIUM LASER APPLICATION;  Surgeon: Sebastian Ache, MD;  Location: [facility];  Service: Urology;  Laterality: Bilateral;   HOLMIUM LASER APPLICATION Bilateral 05/16/2023   Procedure: HOLMIUM LASER APPLICATION;  Surgeon: Loletta Parish., MD;  Location: [facility];  Service: Urology;  Laterality: Bilateral;   LUMBAR SPINE SURGERY  07/2017   @ [facility]  by dr Newell Coral;    L5-- S1  laminectomy   POSTERIOR LUMBAR FUSION  06/28/2020   @ [facility] [place];   L5--S1   PROSTATECTOMY  03/10/2002   @[facility]  by dr Aldean Ast;    w/ pelvic lymph node dissection's   SPINAL CORD STIMULATOR REMOVAL  04/15/2016   W/   THORACIC PARTIAL LAMINECTOMY T9   Patient Active Problem List   Diagnosis Date Noted   Fall 01/05/2024   UTI due to Klebsiella species 11/11/2022   Basal cell carcinoma of scalp 09/16/2022   Deposits (accretions) on teeth 09/16/2022   Nontoxic single thyroid nodule 09/16/2022   Thyroid nodule 09/16/2022   Sensorineural hearing loss, bilateral 09/16/2022   Post-COVID syndrome 09/16/2022   Chronic pansinusitis 07/11/2022   Non-seasonal allergic rhinitis 06/11/2022   Abrasion of right arm 11/19/2021   Femur fracture, right (HCC) 10/17/2021    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HCC) 10/16/2021   History of pulmonary embolism 10/10/2021   Chronic diastolic CHF (congestive heart failure) (HCC) 10/10/2021   CKD (chronic kidney disease), stage III (HCC) 10/10/2021   Hyperlipidemia 10/10/2021   CAD (coronary artery disease) 04/03/2021   Chronic anticoagulation 03/26/2021   Acquired coagulation disorder (HCC) 12/12/2020   Senile purpura (HCC) 12/12/2020   Meibomian gland dysfunction of right eye, unspecified eyelid 07/11/2020   Vitamin B12 deficient megaloblastic anemia 07/11/2020   Aortic atherosclerosis (HCC) 05/17/2020   B12 deficiency 05/17/2020   Iliac artery aneurysm (HCC) 05/17/2020   Osteoporosis 04/04/2020   Idiopathic neuropathy 01/28/2020   Recurrent falls 01/28/2020   GERD (gastroesophageal reflux disease) 12/22/2019   Essential tremor 12/22/2019   Kidney stone 11/05/2017   History of total  right hip replacement 03/08/2017   History of prostate cancer 03/08/2017   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   Vertigo 03/01/2017   High risk medication use 02/28/2017   DDD (degenerative disc disease), thoracic 02/28/2017   Syncope 02/28/2017   Ataxia 05/18/2016   Vestibular disequilibrium 05/18/2016   Failed spinal cord stimulator (HCC) 03/20/2016   Lung nodule, solitary 05/24/2014   Peripheral edema 06/08/2012   Backache 04/21/2008   History of peptic ulcer disease 04/21/2008   Essential hypertension 10/29/2007    PCP: Shelva Majestic, MD  REFERRING PROVIDER: Shelva Majestic, MD  REFERRING DIAG: R26.89 (ICD-10-CM) - Balance disorder R53.1 (ICD-10-CM) - Generalized weakness  Rationale for Evaluation and Treatment: Rehabilitation  THERAPY DIAG:  Muscle weakness (generalized)  Other abnormalities of gait and mobility  ONSET DATE: chronic - slowly worsening over time   SUBJECTIVE:                                                                                                                                                                                            SUBJECTIVE STATEMENT: Pt endorses longstanding history of mobility deficits with multiple MSK issues. Multiple falls in past few months. Uses rollator more so than cane. Doesn't recall last fall episode. States he has fallen during car transfers, mobilizing in home. Pt states he doesn't recall episodes very well but doesn't think he passes out. Difficulty with transfers, standing >5 min. Is seeing neurology soon. Denies SOB or dizziness that affects mobility. Pt states he saw PT last year for this same issue and felt that it helped a great deal. Does [employer] aquatics class 3x/week.   PERTINENT HISTORY:  anemia, LE edema, essential tremor, GERD, basal cell carcinoma, hx DVT/PE, hx bone sarcoma, HTN, osteoporosis, CKD3, hx incontinence, CAD, CHF  PAIN:  Pt reports chronic pain in back and R hip, surgical history in both areas. States sometimes pain will affect his mobility. Reports fairly consistent pain in both areas, about 2/10 on eval at rest, will go to 9-10/10 with movement.   PRECAUTIONS: fall risk, osteoporosis, cardiac hx  WEIGHT BEARING RESTRICTIONS: No  FALLS:  Has patient fallen in last 6 months? Yes. Number of falls ~5 falls ; pt reports most recent in January, does not recall episode  LIVING ENVIRONMENT: No STE, multilevel home main level livable Lives w/ wife who does majority of housework per pt Uses cane vs rollator (usually the latter)  OCCUPATION: retired  - was an Airline pilot  PLOF: mod I for basic mobility  PATIENT GOALS: improve balance and strength  NEXT MD VISIT: TBD  OBJECTIVE:  Note: Objective measures were completed at Evaluation  unless otherwise noted.  DIAGNOSTIC FINDINGS:  01/05/24 CTH, CT abd/pelvis, CT chest PE, CT cervical spine ; refer to EPIC for details  PATIENT SURVEYS:  deferred  COGNITION: Overall cognitive status: Within functional limits for tasks assessed     POSTURE: forward head, shift away from R  side in sitting, reduced truncal extension in standing   STRENGTH TESTING:  MMT Right eval Left eval  Shoulder flexion    Shoulder abduction    Elbow flexion    Elbow extension    Grip strength (gross)    Hip flexion Limited against gravity 4  Hip abduction (modified sitting) 4 4  Knee flexion 3+ 4  Knee extension 3- 4  Ankle dorsiflexion    Ankle plantarflexion     (Blank rows = not tested) (Key: WFL = within functional limits not formally assessed, * = concordant pain, s = stiffness/stretching sensation, NT = not tested)  Comments:     FUNCTIONAL TESTS:  30secSTS: 2 reps w/ UE support and hip pain TUG: 35sec w/ SPC    GAIT: Distance walked: within clinic Assistive device utilized: Single point cane Level of assistance: Modified independence Comments: reduced gait speed/cadence, reduced stance time RLE, fwd flexed posture  TREATMENT DATE:  [facility] Adult PT Treatment:                                                DATE: 03/11/24 Therapeutic Exercise: Standing march, partial tandem practice reps. Education on safety w/ HEP, handout provided                                                                                                                   PATIENT EDUCATION:  Education details: Pt education on PT impairments, prognosis, and POC. Informed consent. Rationale for interventions, safe/appropriate HEP performance Person educated: Patient Education method: Explanation, Demonstration, Tactile cues, Verbal cues Education comprehension: verbalized understanding, returned demonstration, verbal cues required, tactile cues required, and needs further education    HOME EXERCISE PROGRAM: Access Code: [email or web address] URL: [email or web address] Date: 03/11/2024 Prepared by: Fransisco Hertz  Program Notes - please use counter support for balance  Exercises - Semi-Tandem Balance at Counter Top Eyes Open  - 2-3 x daily - 1 sets - 1-2 reps - 20-30sec hold - Standing  March with Counter Support  - 2-3 x daily - 1 sets - 8 reps  ASSESSMENT:  CLINICAL IMPRESSION: Patient is a pleasant 78 y.o. gentleman who was seen today for physical therapy evaluation and treatment for balance/weakness. He states these issues have been ongoing for quite some time, received PT for same issues last year with good success. He notes balance/weakness has worsened in time away from PT, multiple falls over past few months. On exam he demonstrates BIL LE R more so than L, altered mechanics w/ gait and transfers. 30secSTS and TUG are both indicative of fall risk and  reduced functional mobility. He tolerates exam/HEP well, emphasis on fall risk reduction and safety with exercise performance. No adverse events. Recommend skilled PT to address aforementioned deficits with aim of improving functional tolerance and reducing pain with typical activities, as well as reducing fall risk. Pt departs today's session in no acute distress, all voiced concerns/questions addressed appropriately from PT perspective.      OBJECTIVE IMPAIRMENTS: Abnormal gait, decreased activity tolerance, decreased balance, decreased endurance, decreased mobility, difficulty walking, decreased ROM, decreased strength, improper body mechanics, postural dysfunction, and pain.   ACTIVITY LIMITATIONS: carrying, lifting, bending, standing, squatting, stairs, transfers, and locomotion level  PARTICIPATION LIMITATIONS: meal prep, cleaning, laundry, driving, and community activity  PERSONAL FACTORS: Age, Time since onset of injury/illness/exacerbation, and 3+ comorbidities: anemia, LE edema, essential tremor, GERD, basal cell carcinoma, hx DVT/PE, hx bone sarcoma, HTN, osteoporosis, CKD3, hx incontinence, CAD, CHF  are also affecting patient's functional outcome.   REHAB POTENTIAL: Fair given chronicity and comorbidities  CLINICAL DECISION MAKING: Evolving/moderate complexity  EVALUATION COMPLEXITY: Moderate   GOALS:  SHORT  TERM GOALS: Target date: 04/08/2024  Pt will demonstrate appropriate understanding and performance of initially prescribed HEP in order to facilitate improved independence with management of symptoms.  Baseline: HEP established  Goal status: INITIAL   2. Pt will be able to perform 4 sit to stands in >/= 30 sec in order to demonstrate improved activity tolerance and safety w/ transfers.  Baseline: 2 reps w/ UE support  Goal status: INITIAL  LONG TERM GOALS: Target date: 05/06/2024   Pt will endorse ability to walk greater than 373ft with LRAD and less than 5/10 pain on NPS in order to facilitate improved safety w/ community navigation. Baseline: reports 9-10/10 pain in R hip with ambulation from lobby to treatment room Goal status: INITIAL  2.  Pt will demonstrate at least 1 grade improvement in BIL LE MMT in order to facilitate improved functional strength.  Baseline: see MMT chart above  Goal status: INITIAL   3. Pt will be able to perform TUG in less than or equal to 20 sec in order to indicate reduced risk of falling (cutoff score for fall risk 13.5 sec in community dwelling older adults per [facility] et al, 2000)  Baseline: 35sec SPC  Goal status: INITIAL    4. Pt will be able to perform at least 8 repetitions during 30 second sit to stand test in order to demonstrate improved exercise/activity tolerance (cutoff score for low exercise tolerance 18 repetitions in males and 16 repetitions in females per Georgina Snell al 2024)  Baseline: 2 repetitions, with UE support  Goal status: INITIAL    5. Pt will demonstrate appropriate performance of final prescribed HEP in order to facilitate improved self-management of symptoms post-discharge.   Baseline: initial HEP prescribed  Goal status: INITIAL     PLAN:  PT FREQUENCY: 1-2x/week  PT DURATION: 8 weeks  PLANNED INTERVENTIONS: 97164- PT Re-evaluation, 97110-Therapeutic exercises, 97530- Therapeutic activity, [id number]- Neuromuscular  re-education, 97535- Self Care, [id number]- Manual therapy, [phone]- Gait training, [phone]- Aquatic Therapy, Patient/Family education, Balance training, Stair training, Cryotherapy, and Moist heat.  PLAN FOR NEXT SESSION: Review/update HEP PRN. Functional mobility/mechanics. LE strengthening within pt tolerance. Pacing and activity modification as needed, fall risk reduction. Symptom modification strategies as indicated/appropriate. Mindful of cardiac hx, DVT/PE hx, osteoporosis, and cancer hx    Ashley Murrain PT, DPT 03/11/2024 4:16 PM

## 2024-03-15 ENCOUNTER — Encounter (HOSPITAL_BASED_OUTPATIENT_CLINIC_OR_DEPARTMENT_OTHER): Payer: Self-pay | Admitting: Physical Therapy

## 2024-03-15 ENCOUNTER — Ambulatory Visit (HOSPITAL_BASED_OUTPATIENT_CLINIC_OR_DEPARTMENT_OTHER): Admitting: Physical Therapy

## 2024-03-15 DIAGNOSIS — M6281 Muscle weakness (generalized): Secondary | ICD-10-CM | POA: Diagnosis not present

## 2024-03-15 DIAGNOSIS — R2689 Other abnormalities of gait and mobility: Secondary | ICD-10-CM | POA: Diagnosis not present

## 2024-03-15 DIAGNOSIS — R531 Weakness: Secondary | ICD-10-CM | POA: Diagnosis not present

## 2024-03-15 NOTE — Therapy (Signed)
 OUTPATIENT PHYSICAL THERAPY EVALUATION   Patient Name: Bruce Ball MRN: [id number] DOB:05/18/1946, 77 y.o., male Today's Date: 03/16/2024  END OF SESSION:  PT End of Session - 03/15/24 1507     Visit Number 2    Number of Visits 17    Date for PT Re-Evaluation 05/06/24    Authorization Type [employer]BS medicare    PT Start Time 1457    PT Stop Time 1540    PT Time Calculation (min) 43 min    Activity Tolerance Patient tolerated treatment well    Behavior During Therapy WFL for tasks assessed/performed              Past Medical History:  Diagnosis Date   Anemia associated with chronic renal failure    At high risk for falls    due to gait disorders;   recurrent falls   (05-12-2023  per pt last fall 2 weeks ago)   Chronic anticoagulation    eliquis--- managed by pcp   Coronary artery calcification seen on CAT scan    followed by dr t. turner---   05-31-2021  calcium score= 152   Deafness in right ear    ED (erectile dysfunction)    Edema of both lower extremities    Essential tremor    Gait disorder    imbalance when walking uses cane/ walker;  recurrent falls   GERD (gastroesophageal reflux disease)    Hematuria    Hiatal hernia    History of adenomatous polyp of colon    History of agent Orange exposure    History of basal cell carcinoma (BCC) excision    History of DVT of lower extremity 12/2019   admission in epic ;   w/ PE's all lobes   History of esophageal dilatation 12/2019   dr Adela Lank   History of kidney stones    History of peptic ulcer 2009   History of prostate cancer 12/2001   followed by dr Berneice Heinrich;   first dx 01/ 2003,  Gleason 3+3,   03-10-2002  s/p  radical prostatectomy pelvic lymph node disections , no recurrence   History of pulmonary embolus (PE) 12/2019   admission in epic;   submassive all lobes and dvt -lle   History of sarcoma of bone 1970   orthopedic oncologist--- dr w. 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 turner;  monitor in epic 04-08-2023 low PVC burden and atrial ectopy   Renal calculi    bilateral   Stage 3 chronic kidney disease [facility])    nephrologist--- dr Ronalee Belts   Thyroid nodule 2016   endocrinologist--- dr Dewitt Hoes  [facility])  hx benign bx, right side   Urinary incontinence    total incontinence   Wears hearing aid in left ear    Past Surgical History:  Procedure Laterality Date   CYSTOSCOPY WITH RETROGRADE PYELOGRAM, URETEROSCOPY AND STENT PLACEMENT Right 11/06/2017   Procedure: CYSTOSCOPY WITH  STENT PLACEMENT RIGHT;  Surgeon: Heloise Purpura, MD;  Location: [facility];  Service: Urology;  Laterality: Right;   CYSTOSCOPY WITH RETROGRADE PYELOGRAM, URETEROSCOPY AND STENT PLACEMENT Left 06/09/2019   Procedure: CYSTOSCOPY WITH RETROGRADE PYELOGRAM, URETEROSCOPY AND STENT PLACEMENT X2;  Surgeon: Sebastian Ache, MD;  Location: [facility];  Service: Urology;  Laterality: Left;   CYSTOSCOPY WITH RETROGRADE PYELOGRAM, URETEROSCOPY AND STENT PLACEMENT Bilateral 03/01/2020   Procedure: CYSTOSCOPY WITH RETROGRADE PYELOGRAM, URETEROSCOPY AND STENT PLACEMENT;  Surgeon: Sebastian Ache, MD;  Location: [facility];  Service: Urology;  Laterality: Bilateral;  75 MINS   CYSTOSCOPY WITH RETROGRADE PYELOGRAM, URETEROSCOPY AND STENT PLACEMENT Bilateral 05/16/2023   Procedure: CYSTOSCOPY WITH RETROGRADE PYELOGRAM, URETEROSCOPY AND STENT PLACEMENT;  Surgeon: Loletta Parish., MD;  Location: [facility];  Service: Urology;  Laterality: Bilateral;  90 MINS   CYSTOSCOPY/RETROGRADE/URETEROSCOPY  2020   at [facility] [place]   CYSTOSCOPY/URETEROSCOPY/HOLMIUM LASER Right 11/24/2017   Procedure: CYSTOSCOPY/URETEROSCOPY/ RETROGRADE/STENT REMOVAL;  Surgeon: Heloise Purpura, MD;  Location: [facility];  Service: Urology;  Laterality: Right;   FEMORAL OSTEOTOMY W/ RODDING  1970   right proximal femUr resection replacement w/ custom prosthesis   FEMUR FRACTURE SURGERY Right 10/17/2021   @ [facility] by dr w. eward;  REVISION RIGHT PROXIMAL FEMUR REPLACEMENT IN SETTING OF DISTAL PERIPROSTHETIC FEMUR FRACTURE, BOTH COMPONENTS   FUNCTIONAL ENDOSCOPIC SINUS SURGERY  03/19/2023   @ [facility]   HOLMIUM LASER APPLICATION Bilateral 03/01/2020   Procedure: HOLMIUM LASER APPLICATION;  Surgeon: Sebastian Ache, MD;  Location: [facility];  Service: Urology;  Laterality: Bilateral;   HOLMIUM LASER APPLICATION Bilateral 05/16/2023   Procedure: HOLMIUM LASER APPLICATION;  Surgeon: Loletta Parish., MD;  Location: [facility];  Service: Urology;  Laterality: Bilateral;   LUMBAR SPINE SURGERY  07/2017   @ [facility]  by dr Newell Coral;    L5-- S1  laminectomy   POSTERIOR LUMBAR FUSION  06/28/2020   @ [facility] [place];   L5--S1   PROSTATECTOMY  03/10/2002   @[facility]  by dr Aldean Ast;    w/ pelvic lymph node dissection's   SPINAL CORD STIMULATOR REMOVAL  04/15/2016   W/   THORACIC PARTIAL LAMINECTOMY T9   Patient Active Problem List   Diagnosis Date Noted   Fall 01/05/2024   UTI due to Klebsiella species 11/11/2022   Basal cell carcinoma of scalp 09/16/2022   Deposits (accretions) on teeth 09/16/2022   Nontoxic single thyroid nodule 09/16/2022   Thyroid nodule 09/16/2022   Sensorineural hearing loss, bilateral 09/16/2022   Post-COVID syndrome 09/16/2022   Chronic pansinusitis 07/11/2022   Non-seasonal allergic rhinitis 06/11/2022   Abrasion of right arm 11/19/2021   Femur  fracture, right (HCC) 10/17/2021   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HCC) 10/16/2021   History of pulmonary embolism 10/10/2021   Chronic diastolic CHF (congestive heart failure) (HCC) 10/10/2021   CKD (chronic kidney disease), stage III (HCC) 10/10/2021   Hyperlipidemia 10/10/2021   CAD (coronary artery disease) 04/03/2021   Chronic anticoagulation 03/26/2021   Acquired coagulation disorder (HCC) 12/12/2020   Senile purpura (HCC) 12/12/2020   Meibomian gland dysfunction of right eye, unspecified eyelid 07/11/2020   Vitamin B12 deficient megaloblastic anemia 07/11/2020   Aortic atherosclerosis (HCC) 05/17/2020   B12 deficiency 05/17/2020   Iliac artery aneurysm (HCC) 05/17/2020   Osteoporosis 04/04/2020   Idiopathic neuropathy 01/28/2020   Recurrent falls 01/28/2020   GERD (gastroesophageal reflux disease) 12/22/2019   Essential tremor 12/22/2019   Kidney stone 11/05/2017   History  of total right hip replacement 03/08/2017   History of prostate cancer 03/08/2017   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   Vertigo 03/01/2017   High risk medication use 02/28/2017   DDD (degenerative disc disease), thoracic 02/28/2017   Syncope 02/28/2017   Ataxia 05/18/2016   Vestibular disequilibrium 05/18/2016   Failed spinal cord stimulator (HCC) 03/20/2016   Lung nodule, solitary 05/24/2014   Peripheral edema 06/08/2012   Backache 04/21/2008   History of peptic ulcer disease 04/21/2008   Essential hypertension 10/29/2007    PCP: Shelva Majestic, MD  REFERRING PROVIDER: Shelva Majestic, MD  REFERRING DIAG: R26.89 (ICD-10-CM) - Balance disorder R53.1 (ICD-10-CM) - Generalized weakness  Rationale for Evaluation and Treatment: Rehabilitation  THERAPY DIAG:  Muscle weakness (generalized)  Other abnormalities of gait and mobility  ONSET DATE: chronic - slowly worsening over time   SUBJECTIVE:                                                                                                                                                                                            SUBJECTIVE STATEMENT: Pt had his aquatic class today for an hour.  He takes that class 3x/wk.  He states he hurts because the water is so cold.  Pt has been performing his HEP in the pool.  Pt states he has had 2 falls in the past month.  Pt states his back has been bothering him since the last fall.  Pt states MD has referred him to neurology and pain management. Pt reports having balance deficits and having issues with falling.  Pt has difficulty with car transfers.    PERTINENT HISTORY:  anemia, LE edema, essential tremor, GERD, basal cell carcinoma, hx DVT/PE, hx bone sarcoma, HTN, osteoporosis, CKD3, hx incontinence, CAD, CHF  R Hip surgery/proximal femur replacement many years ago with THA revision in the setting of a periprosthetic distal femur fx 10/17/2021  L5-S1 fusion 2021  PAIN:  8/10 anterior hip pain Back pain is sporadic when he moves the wrong way or twists the wrong way.   PRECAUTIONS: fall risk, osteoporosis, cardiac hx  WEIGHT BEARING RESTRICTIONS: No  FALLS:  Has patient fallen in last 6 months? Yes. Number of falls ~5 falls ; pt reports most recent in January, does not recall episode  LIVING ENVIRONMENT: No STE, multilevel home main level livable Lives w/ wife who does majority of housework per pt Uses cane vs rollator (usually the latter)  OCCUPATION: retired  - was an Airline pilot  PLOF: mod I for basic mobility  PATIENT GOALS: improve balance and strength  NEXT MD VISIT: TBD  OBJECTIVE:  Note: Objective  measures were completed at Evaluation unless otherwise noted.  DIAGNOSTIC FINDINGS:  01/05/24 CTH, CT abd/pelvis, CT chest PE, CT cervical spine ; refer to EPIC for details  PATIENT SURVEYS:  deferred  COGNITION: Overall cognitive status: Within functional limits for tasks assessed     POSTURE: forward head, shift away from R side in sitting, reduced truncal extension in  standing   STRENGTH TESTING:  MMT Right eval Left eval  Shoulder flexion    Shoulder abduction    Elbow flexion    Elbow extension    Grip strength (gross)    Hip flexion Limited against gravity 4  Hip abduction (modified sitting) 4 4  Knee flexion 3+ 4  Knee extension 3- 4  Ankle dorsiflexion    Ankle plantarflexion     (Blank rows = not tested) (Key: WFL = within functional limits not formally assessed, * = concordant pain, s = stiffness/stretching sensation, NT = not tested)  Comments:     FUNCTIONAL TESTS:  30secSTS: 2 reps w/ UE support and hip pain TUG: 35sec w/ SPC    GAIT: Distance walked: within clinic Assistive device utilized: Single point cane Level of assistance: Modified independence Comments: reduced gait speed/cadence, reduced stance time RLE, fwd flexed posture  TREATMENT DATE:    Therapeutic Exercise: Reviewed current function, HEP compliance, and pain level. Reviewed HEP.    Standing marching with UE support 2x10 LAQ AROM x10-12 bilat, 1.5# x 15 bilat Seated HS curls RTB 2x12 bilat Sit to stands from elevated table with UE support (chair in front of him)  2x5  Neuro Re-ed Activities: Standing in staggered stance with and without UE support with SBA 2x20-30 sec bilat Standing on floor with NBOS x 45 sec and on airex 2x30 sec with CGA Minimal F/B sways with CGA Alt toe taps on step with rail and cane for UE support 2x10                                                                                                        PATIENT EDUCATION:  Education details:  Exercise form, relevant anatomy, exercise rationale, HEP, and POC.  Person educated: Patient Education method: Explanation, Demonstration, Tactile cues, Verbal cues Education comprehension: verbalized understanding, returned demonstration, verbal cues required, tactile cues required, and needs further education    HOME EXERCISE PROGRAM: Access Code: [email or web address] URL:  [email or web address] Date: 03/11/2024 Prepared by: Fransisco Hertz  Program Notes - please use counter support for balance  Exercises - Semi-Tandem Balance at Counter Top Eyes Open  - 2-3 x daily - 1 sets - 1-2 reps - 20-30sec hold - Standing March with Counter Support  - 2-3 x daily - 1 sets - 8 reps  ASSESSMENT:  CLINICAL IMPRESSION: Pt has balance deficits and reports having 2 falls in the past month.  Pt is slow with and has difficulty with sit to stand transfers.  Pt performed sit to stands with table elevated and the chair in front of him.  He requires UE support with sit to stands.  Pt performed exercises well with cuing  and instruction in correct form.  He tolerated Rx well and had no c/o's after Rx.  He should benefit from skilled PT to improve balance, strength, gait, and functional mobility.    OBJECTIVE IMPAIRMENTS: Abnormal gait, decreased activity tolerance, decreased balance, decreased endurance, decreased mobility, difficulty walking, decreased ROM, decreased strength, improper body mechanics, postural dysfunction, and pain.   ACTIVITY LIMITATIONS: carrying, lifting, bending, standing, squatting, stairs, transfers, and locomotion level  PARTICIPATION LIMITATIONS: meal prep, cleaning, laundry, driving, and community activity  PERSONAL FACTORS: Age, Time since onset of injury/illness/exacerbation, and 3+ comorbidities: anemia, LE edema, essential tremor, GERD, basal cell carcinoma, hx DVT/PE, hx bone sarcoma, HTN, osteoporosis, CKD3, hx incontinence, CAD, CHF  are also affecting patient's functional outcome.   REHAB POTENTIAL: Fair given chronicity and comorbidities  CLINICAL DECISION MAKING: Evolving/moderate complexity  EVALUATION COMPLEXITY: Moderate   GOALS:  SHORT TERM GOALS: Target date: 04/08/2024  Pt will demonstrate appropriate understanding and performance of initially prescribed HEP in order to facilitate improved independence with management of  symptoms.  Baseline: HEP established  Goal status: INITIAL   2. Pt will be able to perform 4 sit to stands in >/= 30 sec in order to demonstrate improved activity tolerance and safety w/ transfers.  Baseline: 2 reps w/ UE support  Goal status: INITIAL  LONG TERM GOALS: Target date: 05/06/2024   Pt will endorse ability to walk greater than 377ft with LRAD and less than 5/10 pain on NPS in order to facilitate improved safety w/ community navigation. Baseline: reports 9-10/10 pain in R hip with ambulation from lobby to treatment room Goal status: INITIAL  2.  Pt will demonstrate at least 1 grade improvement in BIL LE MMT in order to facilitate improved functional strength.  Baseline: see MMT chart above  Goal status: INITIAL   3. Pt will be able to perform TUG in less than or equal to 20 sec in order to indicate reduced risk of falling (cutoff score for fall risk 13.5 sec in community dwelling older adults per [facility] et al, 2000)  Baseline: 35sec SPC  Goal status: INITIAL    4. Pt will be able to perform at least 8 repetitions during 30 second sit to stand test in order to demonstrate improved exercise/activity tolerance (cutoff score for low exercise tolerance 18 repetitions in males and 16 repetitions in females per Georgina Snell al 2024)  Baseline: 2 repetitions, with UE support  Goal status: INITIAL    5. Pt will demonstrate appropriate performance of final prescribed HEP in order to facilitate improved self-management of symptoms post-discharge.   Baseline: initial HEP prescribed  Goal status: INITIAL     PLAN:  PT FREQUENCY: 1-2x/week  PT DURATION: 8 weeks  PLANNED INTERVENTIONS: [id number]- PT Re-evaluation, 97110-Therapeutic exercises, 97530- Therapeutic activity, [id number]- Neuromuscular re-education, 97535- Self Care, [id number]- Manual therapy, [phone]- Gait training, [phone]- Aquatic Therapy, Patient/Family education, Balance training, Stair training, Cryotherapy, and Moist  heat.  PLAN FOR NEXT SESSION: Review/update HEP PRN. Functional mobility/mechanics. LE strengthening within pt tolerance. Pacing and activity modification as needed, fall risk reduction. Symptom modification strategies as indicated/appropriate. Mindful of cardiac hx, DVT/PE hx, osteoporosis, and cancer hx    Audie Clear III PT, DPT 03/16/24 10:55 PM

## 2024-03-18 ENCOUNTER — Ambulatory Visit: Payer: Medicare Other | Admitting: Neurology

## 2024-03-18 ENCOUNTER — Encounter: Payer: Self-pay | Admitting: Neurology

## 2024-03-18 VITALS — BP 162/84 | HR 59 | Ht 71.0 in | Wt 249.0 lb

## 2024-03-18 DIAGNOSIS — C44719 Basal cell carcinoma of skin of left lower limb, including hip: Secondary | ICD-10-CM | POA: Diagnosis not present

## 2024-03-18 DIAGNOSIS — R260 Ataxic gait: Secondary | ICD-10-CM | POA: Diagnosis not present

## 2024-03-18 DIAGNOSIS — G609 Hereditary and idiopathic neuropathy, unspecified: Secondary | ICD-10-CM | POA: Diagnosis not present

## 2024-03-18 DIAGNOSIS — R55 Syncope and collapse: Secondary | ICD-10-CM

## 2024-03-18 DIAGNOSIS — L814 Other melanin hyperpigmentation: Secondary | ICD-10-CM | POA: Diagnosis not present

## 2024-03-18 DIAGNOSIS — Z85828 Personal history of other malignant neoplasm of skin: Secondary | ICD-10-CM | POA: Diagnosis not present

## 2024-03-18 DIAGNOSIS — L821 Other seborrheic keratosis: Secondary | ICD-10-CM | POA: Diagnosis not present

## 2024-03-18 DIAGNOSIS — D1801 Hemangioma of skin and subcutaneous tissue: Secondary | ICD-10-CM | POA: Diagnosis not present

## 2024-03-18 DIAGNOSIS — W19XXXD Unspecified fall, subsequent encounter: Secondary | ICD-10-CM

## 2024-03-18 DIAGNOSIS — L570 Actinic keratosis: Secondary | ICD-10-CM | POA: Diagnosis not present

## 2024-03-18 NOTE — Patient Instructions (Signed)
 I had a long discussion with the patient and his wife regarding his recurrent episodes of brief loss of consciousness and recent falls as well as longstanding history of gait ataxia due to combination of peripheral neuropathy and degenerative spine disease and answered questions.  I recommend further evaluation with checking MRI scan of the brain, MRA of the brain and neck, EEG and 30-day heart monitor.  He was advised to use his cane and walker at all times and get up slowly and avoid sudden movements.  He will return for follow-up in the future in 4 months or call earlier if necessary.

## 2024-03-18 NOTE — Progress Notes (Signed)
 [facility] [address] [place]. [place] [address] [phone]       OFFICE CONSULT NOTE  [name] Date of Birth:  [birth year] Medical Record Number:  [id number]   Referring MD:  Crista Elliot  Reason for Referral: Falls and neuropathy  HPI: Mr. Heinkel is a 78 year old Caucasian male referred for evaluation for episodes of falls and longstanding neuropathy.  He is accompanied by his wife Clydie Braun.  History is obtained from them and review of electronic medical records were.  I personally reviewed pertinent available imaging films in PACS.  He has past medicalhistory of PE, CKD stage III, history of prostrate cancer status post resection, lumbosacral radiculopathy, coronary artery disease and PVCs.  He also has longstanding idiopathic small fiber peripheral neuropathy.  Patient and wife states his been having a few unexplained falls in the last few months.  The first 1 occurred in November 2024 and the second 1 in January 25.  During the first episode the wife noticed that the patient was sitting on the toilet.  He called out for her for help.  When she went the she found that he had a glazed look on his face.  He was not fully responsive and did not answer appropriately.  She helped him get up and walk to the bedroom.  Patient was slightly confused and disoriented for a while but was fine later.  The second episode occurred in January when patient similarly was sitting on the toilet and called out and needed help.  At this time he was more responsive.  The patient went to bed and the wife heard him fall down from the bed.  Patient was confused and disoriented and did not remember what happened.  He was admitted to the hospital.  CT scan of the head showed no acute abnormalities.  CT abdomen pelvis showed no acute findings.  The exact etiology of the fall was not clear.  Chest x-ray showed no pneumonia.  EKG showed no evidence of ischemia.  Patient had previously had a  3-week heart monitor in March 2024 which is negative for any significant arrhythmias.  He does have longstanding history of idiopathic peripheral neuropathy with paresthesias in his feet from the calf down in his fingertips.  He has poor balance.  He also has significant degenerative lumbar spine disease and does walk with a cane for short distances and a walker for long distances.  He knows he has to be careful and avoid sudden movements and quick turning to avoid falls.  He is more bothered by his hip pain and has been followed by orthopedic surgery for that.  He denies any prior history of seizures, tonic-clonic activity, tongue bite.  No prior history of strokes or TIAs. Prior office visit 08/23/2020, Shanda Bumps, NP) Mr. Tanori returns for follow-up regarding neuropathy.Since prior visit, he underwent transforaminal lumbar interbody fusion at L5-S1 on 06/28/2020 by [facility] neurosurgery which was uncomplicated  He has been doing well since surgery with resolution of radiating pain into his lower extremities and lower extremity numbness significantly improved.  He will occasionally experience lower back pain towards the end of the day but this is also been improving.  After prior visit, he was initiated on gabapentin but due to difficulty tolerating, transition him to topiramate but he has since discontinued as it was no longer needed.  His gait and balance have also greatly improved.    He has no questions or concerns at this time.  Update 05/04/2020 Dr. Pearlean Brownie:  He returns for follow-up after last visit 3 months ago.  He continues to have numbness in his feet as well as gait and balance difficulties.  He did undergo lab work on last visit on 02/07/2024 which showed normal vitamin B12 levels of 298 as well as TSH and neuropathy panel labs were normal.  Methylmalonic acid levels were also normal.  ANA, ESR, rheumatoid factor and angiotensin-converting enzymes levels were also normal.  Hemoglobin A1c was 5.5.   He underwent EMG nerve conduction study on 03/13/2020 by Dr. Anne Hahn which showed some involvement of mainly motor muscles in the lower extremities with decreased recruitment   but no convincing evidence of sensorimotor neuropathy.  Patient was seen by neurosurgeon for back pain and apparently had an MRI scan of the lumbosacral spine on 04/06/2020 which showed moderate Nedra Hai large extruded disc fragment on the right at L5-S1 with compression of the right L5 nerve root which has progressed compared to previous MRI.  He has intermittent right sided sciatica.  He has been advised surgery but patient is reluctant.  He states that every time he twists his back about 30 degrees or so he gets sciatic pain.  Is currently not on any medications for neuropathic pain or paresthesias.  He has previously tried gabapentin in the past and tolerated well and is willing to try it again.   Initial visit 02/07/2020 Dr. Pearlean Brownie: Mr. Miske is a pleasant 78 year old Caucasian male seen today for initial office consultation visit for neuropathy and gait imbalance.  History is obtained from the patient and his wife is accompanying him today as well as review of electronic medical records and have reviewed available imaging films in PACS.  Mr. 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  He has noticed the worsening of his paresthesias in his feet over the last couple of years as well as increasing balance difficulties.  He has had frequent falls in the last few months and started using a walker now which gives him more steadiness.  He did  denies significant pain or discomfort and is able to sleep in the night.  He feels his discomfort paresthesias are more when he is on his feet and provide some relief when he rests and sits down.  He states he did have EMG nerve conduction study was done 7 to 8 years ago but is unable to give me the results.  He was seen by neurologist once in the remote past with neuropathy was first diagnosed but did not go for follow-up since his follow-up was in the [place] [facility].  He has not had any recent vitamin B12 levels neuropathy panel labs checked.  He had an episode of sudden onset of dizziness and right ear hearing loss in 2017 for which she was seen in the emergency room and MRI scan at that time was unremarkable he was diagnosed with peripheral vestibular neuronitis but states his hearing did not significantly come back in the right ear and his balance was also affected since then.  He had a MRI scan of  the brain done on 11/16/2019 for one of his falls and dizziness which showed no acute abnormality with moderate changes of chronic small vessel disease.   ROS:   14 system review of systems is positive for fall, brief altered consciousness, speech difficulty, tingling, numbness, gait imbalance, difficulty walking all other systems negative  PMH:  Past Medical History:  Diagnosis Date   Anemia associated with chronic renal failure    At high risk for falls    due to gait disorders;   recurrent falls   (05-12-2023  per pt last fall 2 weeks ago)   Chronic anticoagulation    eliquis--- managed by pcp   Coronary artery calcification seen on CAT scan    followed by dr t. turner---   05-31-2021  calcium score= 152   Deafness in right ear    ED (erectile dysfunction)    Edema of both lower extremities    Essential tremor    Gait disorder    imbalance when walking uses cane/ walker;  recurrent falls   GERD (gastroesophageal reflux disease)    Hematuria    Hiatal hernia    History of adenomatous polyp  of colon    History of agent Orange exposure    History of basal cell carcinoma (BCC) excision    History of DVT of lower extremity 12/2019   admission in epic ;   w/ PE's all lobes   History of esophageal dilatation 12/2019   dr Adela Lank   History of kidney stones    History of peptic ulcer 2009   History of prostate cancer 12/2001   followed by dr Berneice Heinrich;   first dx 01/ 2003,  Gleason 3+3,   03-10-2002  s/p  radical prostatectomy pelvic lymph node disections , no recurrence   History of pulmonary embolus (PE) 12/2019   admission in epic;   submassive all lobes and dvt -lle   History of sarcoma of bone 1970   orthopedic oncologist--- dr w. 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 Mayford Knife;  monitor in epic 04-08-2023 low PVC burden and atrial ectopy   Renal calculi    bilateral   Stage 3 chronic kidney disease [facility])    nephrologist--- dr Ronalee Belts   Thyroid nodule 2016   endocrinologist--- dr Dewitt Hoes  [facility]ryne Sharper)  hx benign bx, right side   Urinary incontinence    total incontinence   Wears hearing aid in left ear     Social History:  Social History   Socioeconomic History   Marital status: Married    Spouse name: Not on file   Number of children: 2   Years of education: Not on file   Highest education level: Not on file  Occupational History   Occupation: Retired Airline pilot  Tobacco Use   Smoking status: Former    Current packs/day: 0.00    Average  packs/day: 1 pack/day for 20.0 years (20.0 ttl pk-yrs)  Types: Cigarettes    Start date: 1963    Quit date: 92    Years since quitting: 45.2   Smokeless tobacco: Never  Vaping Use   Vaping status: Never Used  Substance and Sexual Activity   Alcohol use: No   Drug use: Never   Sexual activity: Not on file  Other Topics Concern   Not on file  Social History Narrative   ** Merged History Encounter **       Social Drivers of Health   Financial Resource Strain: Low Risk  (02/17/2024)   Overall Financial Resource Strain (CARDIA)    Difficulty of Paying Living Expenses: Not hard at all  Food Insecurity: No Food Insecurity (02/17/2024)   Hunger Vital Sign    Worried About Running Out of Food in the Last Year: Never true    Ran Out of Food in the Last Year: Never true  Transportation Needs: No Transportation Needs (02/17/2024)   PRAPARE - Administrator, Civil Service (Medical): No    Lack of Transportation (Non-Medical): No  Physical Activity: Sufficiently Active (02/17/2024)   Exercise Vital Sign    Days of Exercise per Week: 3 days    Minutes of Exercise per Session: 60 min  Stress: No Stress Concern Present (02/17/2024)   [employer] of Occupational Health - Occupational Stress Questionnaire    Feeling of Stress : Not at all  Social Connections: Moderately Isolated (02/17/2024)   Social Connection and Isolation Panel [NHANES]    Frequency of Communication with Friends and Family: More than three times a week    Frequency of Social Gatherings with Friends and Family: More than three times a week    Attends Religious Services: Never    Database administrator or Organizations: No    Attends Banker Meetings: Never    Marital Status: Married  Catering manager Violence: Not At Risk (02/17/2024)   Humiliation, Afraid, Rape, and Kick questionnaire    Fear of Current or Ex-Partner: No    Emotionally Abused: No    Physically Abused: No    Sexually  Abused: No    Medications:   Current Outpatient Medications on File Prior to Visit  Medication Sig Dispense Refill   acetaminophen (TYLENOL) 500 MG tablet Take 2 tablets (1,000 mg total) by mouth every 6 (six) hours as needed for headache, fever, moderate pain (pain score 4-6) or mild pain (pain score 1-3). 30 tablet 0   apixaban (ELIQUIS) 2.5 MG TABS tablet Take 1 tablet (2.5 mg total) by mouth 2 (two) times daily.     atorvastatin (LIPITOR) 20 MG tablet Take 1 tablet (20 mg total) by mouth at bedtime. (Patient taking differently: Take 20 mg by mouth at bedtime.) 90 tablet 3   Budesonide 2 MG/10ML SUSP Take by mouth. 1/2 twice a day     carvedilol (COREG) 25 MG tablet Take 25 mg by mouth 2 (two) times daily with a meal.     cetirizine (ZYRTEC) 10 MG tablet Take 10 mg by mouth as needed for allergies.     Cholecalciferol (VITAMIN D3) 50 MCG (2000 UT) capsule Take 2,000 Units by mouth in the morning and at bedtime.      fluticasone (FLONASE) 50 MCG/ACT nasal spray Place 1-2 sprays into both nostrils daily as needed for allergies or rhinitis.     furosemide (LASIX) 20 MG tablet Take 20 mg by mouth daily as needed for fluid (if the feet and/or legs swell).  HYDROmorphone (DILAUDID) 4 MG tablet Take 0.5 tablets (2 mg total) by mouth every 12 (twelve) hours as needed for severe pain (pain score 7-10). 10 tablet 0   methocarbamol (ROBAXIN) 500 MG tablet Take 1 tablet (500 mg total) by mouth every 8 (eight) hours as needed for muscle spasms. 20 tablet 0   metoCLOPramide (REGLAN) 10 MG tablet Take by mouth.     Multiple Vitamin (MULTIVITAMIN) capsule Take 1 capsule by mouth daily.     pantoprazole (PROTONIX) 40 MG tablet Take 40 mg by mouth 2 (two) times daily before a meal.     potassium citrate (UROCIT-K) 10 MEQ (1080 MG) SR tablet Take 1 tablet (10 mEq total) by mouth as directed. 90 tablet 1   No current facility-administered medications on file prior to visit.    Allergies:   Allergies   Allergen Reactions   Tape Other (See Comments)    NEEDS COBAN WRAP- NO TAPE!!!!- SKIN TEARS VERY EASILY!!!!   Erythromycin Nausea And Vomiting   Lisinopril Other (See Comments)    Lowered the blood pressure TOO MUCH!! All ACE Inhibitors per wife at bedside    Physical Exam General: Obese elderly Caucasian male seated, in no evident distress Head: head normocephalic and atraumatic.   Neck: supple with no carotid or supraclavicular bruits Cardiovascular: regular rate and rhythm, no murmurs Musculoskeletal: no deformity Skin:  no rash/petichiae Vascular:  Normal pulses all extremities  Neurologic Exam Mental Status: Awake and fully alert. Oriented to place and time. Recent and remote memory intact. Attention span, concentration and fund of knowledge appropriate. Mood and affect appropriate.  Cranial Nerves: Fundoscopic exam reveals sharp disc margins. Pupils equal, briskly reactive to light. Extraocular movements full without nystagmus. Visual fields full to confrontation. Hearing intact. Facial sensation intact. Face, tongue, palate moves normally and symmetrically.  Motor: Normal bulk and tone. Normal strength in all tested extremity muscles. Sensory.:  Diminished touch , pinprick , position and vibratory sensation from mid calf down bilaterally.  Positive Romberg sign..  Coordination: Rapid alternating movements normal in all extremities. Finger-to-nose and heel-to-shin performed accurately bilaterally.  Unsteady while standing on either foot unsupported on his toes and heels Gait and Station: Arises from chair with  difficulty. Stance is broad-based.  Wide-based ataxic gait using a walker..  Unable to do tandem walking. Reflexes: 1+ and symmetric except knee jerks are depressed and both ankle jerks absent. Toes downgoing.      ASSESSMENT: 78 year old Caucasian male with recurrent brief altered and falls of unclear etiology.  Possibilities include presyncopal events versus complex  partial seizures.  Longstanding history of gait ataxia and imbalance due to combination of idiopathic small fiber peripheral neuropathy and degenerative spine disease.     PLAN:I had a long discussion with the patient and his wife regarding his recurrent episodes of brief loss of consciousness and recent falls as well as longstanding history of gait ataxia due to combination of peripheral neuropathy and degenerative spine disease and answered questions.  I recommend further evaluation with checking MRI scan of the brain, MRA of the brain and neck, EEG and 30-day heart monitor.  He was advised to use his cane and walker at all times and get up slowly and avoid sudden movements.  He will return for follow-up in the future in 4 months or call earlier if necessary.  Greater than 50% time during this 45-minute consultation visit was spent in counseling and coordination of care about his episodes of brief altered consciousness and unexplained falls  as well as longstanding gait and balance difficulties and answering questions  Delia Heady, MD Note: This document was prepared with digital dictation and possible smart phrase technology. Any transcriptional errors that result from this process are unintentional.

## 2024-03-19 ENCOUNTER — Telehealth: Payer: Self-pay | Admitting: Neurology

## 2024-03-19 NOTE — Telephone Encounter (Signed)
 MRI brain [facility] auth: [id number] exp. 03/19/24-04/17/24  MRA head/neck [employer] medicare auth: [id number] exp. 03/19/24-04/17/24 sent to GI [phone]

## 2024-03-25 ENCOUNTER — Encounter (HOSPITAL_BASED_OUTPATIENT_CLINIC_OR_DEPARTMENT_OTHER): Admitting: Physical Therapy

## 2024-03-27 DIAGNOSIS — J069 Acute upper respiratory infection, unspecified: Secondary | ICD-10-CM | POA: Diagnosis not present

## 2024-03-27 DIAGNOSIS — R059 Cough, unspecified: Secondary | ICD-10-CM | POA: Diagnosis not present

## 2024-03-31 ENCOUNTER — Ambulatory Visit (HOSPITAL_BASED_OUTPATIENT_CLINIC_OR_DEPARTMENT_OTHER): Admitting: Physical Therapy

## 2024-04-01 ENCOUNTER — Ambulatory Visit: Admitting: Neurology

## 2024-04-01 DIAGNOSIS — R55 Syncope and collapse: Secondary | ICD-10-CM | POA: Diagnosis not present

## 2024-04-01 NOTE — Progress Notes (Signed)
Kindly inform the patient that EEG study was normal.  No seizure activity noted.

## 2024-04-02 ENCOUNTER — Ambulatory Visit (INDEPENDENT_AMBULATORY_CARE_PROVIDER_SITE_OTHER): Admitting: Family Medicine

## 2024-04-02 VITALS — BP 115/72 | HR 72 | Temp 97.5°F | Ht 71.0 in | Wt 235.0 lb

## 2024-04-02 DIAGNOSIS — R059 Cough, unspecified: Secondary | ICD-10-CM | POA: Diagnosis not present

## 2024-04-02 MED ORDER — AZITHROMYCIN 250 MG PO TABS: ORAL_TABLET | ORAL | 0 refills | Status: DC

## 2024-04-02 MED ORDER — PREDNISONE 50 MG PO TABS: ORAL_TABLET | ORAL | 0 refills | Status: DC

## 2024-04-02 NOTE — Patient Instructions (Signed)
 It was very nice to see you today!  I think you probably have bronchitis.  Please start the azithromycin and prednisone.  Use the Tessalon as needed.  Make sure that you are getting plenty of fluids and staying well-hydrated.  Let us know if not improving.  Return if symptoms worsen or fail to improve.   Take care, Dr Jimmey Ralph  PLEASE NOTE:  If you had any lab tests, please let us know if you have not heard back within a few days. You may see your results on mychart before we have a chance to review them but we will give you a call once they are reviewed by [name].   If we ordered any referrals today, please let us know if you have not heard from their office within the next week.   If you had any urgent prescriptions sent in today, please check with the pharmacy within an hour of our visit to make sure the prescription was transmitted appropriately.   Please try these tips to maintain a healthy lifestyle:  Eat at least 3 REAL meals and 1-2 snacks per day.  Aim for no more than 5 hours between eating.  If you eat breakfast, please do so within one hour of getting up.   Each meal should contain half fruits/vegetables, one quarter protein, and one quarter carbs (no bigger than a computer mouse)  Cut down on sweet beverages. This includes juice, soda, and sweet tea.   Drink at least 1 glass of water with each meal and aim for at least 8 glasses per day  Exercise at least 150 minutes every week.

## 2024-04-02 NOTE — Progress Notes (Signed)
   Bruce Ball is a 78 y.o. male who presents today for an office visit.  Assessment/Plan:  Cough  Overall reassuring exam.  No red flags.  Likely had viral URI a week ago that is resolving.  Will send in pocket prescription for azithromycin and prednisone as he has done well with this combination in the past.  Encouraged hydration.  He can continue over-the-counter meds as well.  We discussed reasons to return to care.  Follow-up as needed.    Subjective:  HPI:  See A/P for status of chronic conditions.  Patient is here with his wife today.  Primary concern today is cough.  She has had similar symptoms as well.  Symptoms started about a week ago while visiting family in [place].  Went to a walk-in clinic while there and was tested for COVID, flu, and strep all of which were negative.  Tried over-the-counter medications with some improvement.  No fevers or chills.  No chest pain or shortness of breath.  Symptoms have improved the last couple of days.       Objective:  Physical Exam: BP 115/72   Pulse 72   Temp (!) 97.5 F (36.4 C) (Temporal)   Ht 5\' 11"  (1.803 m)   Wt 235 lb (106.6 kg)   SpO2 98%   BMI 32.78 kg/m   Gen: No acute distress, resting comfortably HEENT: TMs clear.  OP clear.  Nares mucosa erythematous. CV: Regular rate and rhythm with no murmurs appreciated Pulm: Normal work of breathing, says.  Coarse rhonchi throughout lung fields.  Speaking in full sentence Neuro: Grossly normal, moves all extremities Psych: Normal affect and thought content      Harlo Fabela M. Jimmey Ralph, MD 04/02/2024 11:30 AM

## 2024-04-06 ENCOUNTER — Ambulatory Visit
Admission: RE | Admit: 2024-04-06 | Discharge: 2024-04-06 | Disposition: A | Discharge status: Home / Self Care | Source: Ambulatory Visit | Attending: Neurology | Admitting: Neurology

## 2024-04-06 DIAGNOSIS — R260 Ataxic gait: Secondary | ICD-10-CM | POA: Diagnosis not present

## 2024-04-06 MED ORDER — GADOPICLENOL 0.5 MMOL/ML IV SOLN
10.0000 mL | Freq: Once | INTRAVENOUS | Status: AC | PRN
Start: 2024-04-06 — End: 2024-04-06
  Administered 2024-04-06: 10 mL via INTRAVENOUS

## 2024-04-07 ENCOUNTER — Encounter (HOSPITAL_BASED_OUTPATIENT_CLINIC_OR_DEPARTMENT_OTHER): Payer: Self-pay | Admitting: Physical Therapy

## 2024-04-07 ENCOUNTER — Ambulatory Visit (HOSPITAL_BASED_OUTPATIENT_CLINIC_OR_DEPARTMENT_OTHER): Attending: Family Medicine | Admitting: Physical Therapy

## 2024-04-07 DIAGNOSIS — M6281 Muscle weakness (generalized): Secondary | ICD-10-CM | POA: Insufficient documentation

## 2024-04-07 DIAGNOSIS — R2689 Other abnormalities of gait and mobility: Secondary | ICD-10-CM | POA: Diagnosis not present

## 2024-04-07 NOTE — Therapy (Signed)
 OUTPATIENT PHYSICAL THERAPY TREATMENT   Patient Name: Bruce Ball MRN: [id number] DOB:14-Oct-1946, 78 y.o., male Today's Date: 04/08/2024  END OF SESSION:  PT End of Session - 04/07/24 1543     Visit Number 3    Number of Visits 17    Date for PT Re-Evaluation 05/06/24    Authorization Type [employer] medicare    PT Start Time 1539    PT Stop Time 1622    PT Time Calculation (min) 43 min    Activity Tolerance Patient tolerated treatment well    Behavior During Therapy WFL for tasks assessed/performed              Past Medical History:  Diagnosis Date   Anemia associated with chronic renal failure    At high risk for falls    due to gait disorders;   recurrent falls   (05-12-2023  per pt last fall 2 weeks ago)   Chronic anticoagulation    eliquis--- managed by pcp   Coronary artery calcification seen on CAT scan    followed by dr t. turner---   05-31-2021  calcium score= 152   Deafness in right ear    ED (erectile dysfunction)    Edema of both lower extremities    Essential tremor    Gait disorder    imbalance when walking uses cane/ walker;  recurrent falls   GERD (gastroesophageal reflux disease)    Hematuria    Hiatal hernia    History of adenomatous polyp of colon    History of agent Orange exposure    History of basal cell carcinoma (BCC) excision    History of DVT of lower extremity 12/2019   admission in epic ;   w/ PE's all lobes   History of esophageal dilatation 12/2019   dr Adela Lank   History of kidney stones    History of peptic ulcer 2009   History of prostate cancer 12/2001   followed by dr Berneice Heinrich;   first dx 01/ 2003,  Gleason 3+3,   03-10-2002  s/p  radical prostatectomy pelvic lymph node disections , no recurrence   History of pulmonary embolus (PE) 12/2019   admission in epic;   submassive all lobes and dvt -lle   History of sarcoma of bone 1970   orthopedic oncologist--- dr w. 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 turner;  monitor in epic 04-08-2023 low PVC burden and atrial ectopy   Renal calculi    bilateral   Stage 3 chronic kidney disease [facility])    nephrologist--- dr Ronalee Belts   Thyroid nodule 2016   endocrinologist--- dr Dewitt Hoes  [facility])  hx benign bx, right side   Urinary incontinence    total incontinence   Wears hearing aid in left ear    Past Surgical History:  Procedure Laterality Date   CYSTOSCOPY WITH RETROGRADE PYELOGRAM, URETEROSCOPY AND STENT PLACEMENT Right 11/06/2017   Procedure: CYSTOSCOPY WITH  STENT PLACEMENT RIGHT;  Surgeon: Heloise Purpura, MD;  Location: [facility];  Service: Urology;  Laterality: Right;   CYSTOSCOPY WITH RETROGRADE PYELOGRAM, URETEROSCOPY AND STENT PLACEMENT Left 06/09/2019   Procedure: CYSTOSCOPY WITH RETROGRADE PYELOGRAM, URETEROSCOPY AND STENT PLACEMENT X2;  Surgeon: Sebastian Ache, MD;  Location: [facility];  Service: Urology;  Laterality: Left;   CYSTOSCOPY WITH RETROGRADE PYELOGRAM, URETEROSCOPY AND STENT PLACEMENT Bilateral 03/01/2020   Procedure: CYSTOSCOPY WITH RETROGRADE PYELOGRAM, URETEROSCOPY AND STENT PLACEMENT;  Surgeon: Sebastian Ache, MD;  Location: [facility];  Service: Urology;  Laterality: Bilateral;  75 MINS   CYSTOSCOPY WITH RETROGRADE PYELOGRAM, URETEROSCOPY AND STENT PLACEMENT Bilateral 05/16/2023   Procedure: CYSTOSCOPY WITH RETROGRADE PYELOGRAM, URETEROSCOPY AND STENT PLACEMENT;  Surgeon: Loletta Parish., MD;  Location: [facility];  Service: Urology;  Laterality: Bilateral;  90 MINS   CYSTOSCOPY/RETROGRADE/URETEROSCOPY  2020   at [facility] in [place]   CYSTOSCOPY/URETEROSCOPY/HOLMIUM LASER Right 11/24/2017   Procedure: CYSTOSCOPY/URETEROSCOPY/ RETROGRADE/STENT REMOVAL;  Surgeon: Heloise Purpura, MD;  Location: [facility];  Service: Urology;  Laterality: Right;   FEMORAL OSTEOTOMY W/ RODDING  1970   right proximal femUr resection replacement w/ custom prosthesis   FEMUR FRACTURE SURGERY Right 10/17/2021   @ [facility] by dr w. eward;  REVISION RIGHT PROXIMAL FEMUR REPLACEMENT IN SETTING OF DISTAL PERIPROSTHETIC FEMUR FRACTURE, BOTH COMPONENTS   FUNCTIONAL ENDOSCOPIC SINUS SURGERY  03/19/2023   @ [facility]   HOLMIUM LASER APPLICATION Bilateral 03/01/2020   Procedure: HOLMIUM LASER APPLICATION;  Surgeon: Sebastian Ache, MD;  Location: [facility];  Service: Urology;  Laterality: Bilateral;   HOLMIUM LASER APPLICATION Bilateral 05/16/2023   Procedure: HOLMIUM LASER APPLICATION;  Surgeon: Loletta Parish., MD;  Location: [facility];  Service: Urology;  Laterality: Bilateral;   LUMBAR SPINE SURGERY  07/2017   @ [facility]  by dr Newell Coral;    L5-- S1  laminectomy   POSTERIOR LUMBAR FUSION  06/28/2020   @ [facility] in [place];   L5--S1   PROSTATECTOMY  03/10/2002   @[facility]  by dr Aldean Ast;    w/ pelvic lymph node dissection's   SPINAL CORD STIMULATOR REMOVAL  04/15/2016   W/   THORACIC PARTIAL LAMINECTOMY T9   Patient Active Problem List   Diagnosis Date Noted   Fall 01/05/2024   UTI due to Klebsiella species 11/11/2022   Basal cell carcinoma of scalp 09/16/2022   Deposits (accretions) on teeth 09/16/2022   Nontoxic single thyroid nodule 09/16/2022   Thyroid nodule 09/16/2022   Sensorineural hearing loss, bilateral 09/16/2022   Post-COVID syndrome 09/16/2022   Chronic pansinusitis 07/11/2022   Non-seasonal allergic rhinitis 06/11/2022   Abrasion of right arm 11/19/2021   Femur  fracture, right (HCC) 10/17/2021   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HCC) 10/16/2021   History of pulmonary embolism 10/10/2021   Chronic diastolic CHF (congestive heart failure) (HCC) 10/10/2021   CKD (chronic kidney disease), stage III (HCC) 10/10/2021   Hyperlipidemia 10/10/2021   CAD (coronary artery disease) 04/03/2021   Chronic anticoagulation 03/26/2021   Acquired coagulation disorder (HCC) 12/12/2020   Senile purpura (HCC) 12/12/2020   Meibomian gland dysfunction of right eye, unspecified eyelid 07/11/2020   Vitamin B12 deficient megaloblastic anemia 07/11/2020   Aortic atherosclerosis (HCC) 05/17/2020   B12 deficiency 05/17/2020   Iliac artery aneurysm (HCC) 05/17/2020   Osteoporosis 04/04/2020   Idiopathic neuropathy 01/28/2020   Recurrent falls 01/28/2020   GERD (gastroesophageal reflux disease) 12/22/2019   Essential tremor 12/22/2019   Kidney stone 11/05/2017   History  of total right hip replacement 03/08/2017   History of prostate cancer 03/08/2017   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   Vertigo 03/01/2017   High risk medication use 02/28/2017   DDD (degenerative disc disease), thoracic 02/28/2017   Syncope 02/28/2017   Ataxia 05/18/2016   Vestibular disequilibrium 05/18/2016   Failed spinal cord stimulator (HCC) 03/20/2016   Lung nodule, solitary 05/24/2014   Peripheral edema 06/08/2012   Backache 04/21/2008   History of peptic ulcer disease 04/21/2008   Essential hypertension 10/29/2007    PCP: Shelva Majestic, MD  REFERRING PROVIDER: Shelva Majestic, MD  REFERRING DIAG: R26.89 (ICD-10-CM) - Balance disorder R53.1 (ICD-10-CM) - Generalized weakness  Rationale for Evaluation and Treatment: Rehabilitation  THERAPY DIAG:  Muscle weakness (generalized)  Other abnormalities of gait and mobility  ONSET DATE: chronic - slowly worsening over time   SUBJECTIVE:                                                                                                                                                                                            SUBJECTIVE STATEMENT: Pt went to [place] and became sick afterwards.  Pt was put on prednisone and an antibiotic.  Pt has finished prednisone and still taking the antibiotic.  Pt hasn't performed his aquatic class.  Pt denies any falls recently.  Pt denies pain currently.  Pt hasn't been performing his home exercises.   Pt had brain and neck MRI's with dye.  He states the dye has made him feel dizzy.   PERTINENT HISTORY:  anemia, LE edema, essential tremor, GERD, basal cell carcinoma, hx DVT/PE, hx bone sarcoma, HTN, osteoporosis, CKD3, hx incontinence, CAD, CHF  R Hip surgery/proximal femur replacement many years ago with THA revision in the setting of a periprosthetic distal femur fx 10/17/2021  L5-S1 fusion 2021  PAIN:  0/10 anterior hip pain and lumbar pain   PRECAUTIONS: fall risk, osteoporosis, cardiac hx  WEIGHT BEARING RESTRICTIONS: No  FALLS:  Has patient fallen in last 6 months? Yes. Number of falls ~5 falls ; pt reports most recent in January, does not recall episode  LIVING ENVIRONMENT: No STE, multilevel home main level livable Lives w/ wife who does majority of housework per pt Uses cane vs rollator (usually the latter)  OCCUPATION: retired  - was an Airline pilot  PLOF: mod I for basic mobility  PATIENT GOALS: improve balance and strength  NEXT MD VISIT: TBD  OBJECTIVE:  Note: Objective measures were completed at Evaluation unless otherwise noted.  DIAGNOSTIC FINDINGS:  01/05/24 CTH, CT abd/pelvis, CT chest PE, CT cervical spine ; refer to [facility] for details  PATIENT  SURVEYS:  deferred  COGNITION: Overall cognitive status: Within functional limits for tasks assessed     POSTURE: forward head, shift away from R side in sitting, reduced truncal extension in standing   STRENGTH TESTING:  MMT Right eval Left eval  Shoulder flexion    Shoulder abduction    Elbow flexion    Elbow  extension    Grip strength (gross)    Hip flexion Limited against gravity 4  Hip abduction (modified sitting) 4 4  Knee flexion 3+ 4  Knee extension 3- 4  Ankle dorsiflexion    Ankle plantarflexion     (Blank rows = not tested) (Key: WFL = within functional limits not formally assessed, * = concordant pain, s = stiffness/stretching sensation, NT = not tested)  Comments:     FUNCTIONAL TESTS:  30secSTS: 2 reps w/ UE support and hip pain TUG: 35sec w/ SPC    GAIT: Distance walked: within clinic Assistive device utilized: Single point cane Level of assistance: Modified independence Comments: reduced gait speed/cadence, reduced stance time RLE, fwd flexed posture  TREATMENT DATE:   4/9 Therapeutic Exercise: Reviewed pt presentation, HEP compliance, and pain level.  Nustep UE/LE's x 5 mins at L3 LAQ AROM x20 reps each with 1.5# and 2.5# bilat Seated HS curls RTB x10 bilat and with GTB x 10 bilat Supine bridge with GTB around LE's 2x10  Neuro Re-ed Activities: Standing in staggered stance without UE support with SBA 2x30 sec bilat Standing on airex with NBOS 2x30 sec with CGA Alt toe taps on 6 inch step with rail and cane for UE support 2x10                                                                                                        PATIENT EDUCATION:  Education details:  Exercise form, relevant anatomy, exercise rationale, HEP, and POC.  Person educated: Patient Education method: Explanation, Demonstration, Tactile cues, Verbal cues Education comprehension: verbalized understanding, returned demonstration, verbal cues required, tactile cues required, and needs further education    HOME EXERCISE PROGRAM: Access Code: [email or web address] URL: [email or web address] Date: 03/11/2024 Prepared by: Fransisco Hertz  Program Notes - please use counter support for balance  Exercises - Semi-Tandem Balance at Counter Top Eyes Open  - 2-3 x daily - 1 sets - 1-2 reps  - 20-30sec hold - Standing March with Counter Support  - 2-3 x daily - 1 sets - 8 reps  ASSESSMENT:  CLINICAL IMPRESSION: Pt gave good effort with all exercises.  PT worked on LE strengthening and improving balance and proprioception.  He performed there ex and neuro re-ed activities well with cuing and instruction in correct form.  PT to increase resistance with LAQ next visit.  Pt is slow with gait.  He tolerated Rx well and had no c/o's after Rx.  Pt has finished prednisone though states his hip feels better since taking prednisone.  He should benefit from continued skilled PT to improve balance, strength, gait, and functional mobility.    OBJECTIVE IMPAIRMENTS: Abnormal gait, decreased activity tolerance, decreased balance,  decreased endurance, decreased mobility, difficulty walking, decreased ROM, decreased strength, improper body mechanics, postural dysfunction, and pain.   ACTIVITY LIMITATIONS: carrying, lifting, bending, standing, squatting, stairs, transfers, and locomotion level  PARTICIPATION LIMITATIONS: meal prep, cleaning, laundry, driving, and community activity  PERSONAL FACTORS: Age, Time since onset of injury/illness/exacerbation, and 3+ comorbidities: anemia, LE edema, essential tremor, GERD, basal cell carcinoma, hx DVT/PE, hx bone sarcoma, HTN, osteoporosis, CKD3, hx incontinence, CAD, CHF  are also affecting patient's functional outcome.   REHAB POTENTIAL: Fair given chronicity and comorbidities  CLINICAL DECISION MAKING: Evolving/moderate complexity  EVALUATION COMPLEXITY: Moderate   GOALS:  SHORT TERM GOALS: Target date: 04/08/2024  Pt will demonstrate appropriate understanding and performance of initially prescribed HEP in order to facilitate improved independence with management of symptoms.  Baseline: HEP established  Goal status: INITIAL   2. Pt will be able to perform 4 sit to stands in >/= 30 sec in order to demonstrate improved activity tolerance and  safety w/ transfers.  Baseline: 2 reps w/ UE support  Goal status: INITIAL  LONG TERM GOALS: Target date: 05/06/2024   Pt will endorse ability to walk greater than 390ft with LRAD and less than 5/10 pain on NPS in order to facilitate improved safety w/ community navigation. Baseline: reports 9-10/10 pain in R hip with ambulation from lobby to treatment room Goal status: INITIAL  2.  Pt will demonstrate at least 1 grade improvement in BIL LE MMT in order to facilitate improved functional strength.  Baseline: see MMT chart above  Goal status: INITIAL   3. Pt will be able to perform TUG in less than or equal to 20 sec in order to indicate reduced risk of falling (cutoff score for fall risk 13.5 sec in community dwelling older adults per [facility] et al, 2000)  Baseline: 35sec SPC  Goal status: INITIAL    4. Pt will be able to perform at least 8 repetitions during 30 second sit to stand test in order to demonstrate improved exercise/activity tolerance (cutoff score for low exercise tolerance 18 repetitions in males and 16 repetitions in females per Georgina Snell al 2024)  Baseline: 2 repetitions, with UE support  Goal status: INITIAL    5. Pt will demonstrate appropriate performance of final prescribed HEP in order to facilitate improved self-management of symptoms post-discharge.   Baseline: initial HEP prescribed  Goal status: INITIAL     PLAN:  PT FREQUENCY: 1-2x/week  PT DURATION: 8 weeks  PLANNED INTERVENTIONS: 97164- PT Re-evaluation, 97110-Therapeutic exercises, 97530- Therapeutic activity, [id number]- Neuromuscular re-education, 97535- Self Care, [id number]- Manual therapy, [id number]- Gait training, [phone]- Aquatic Therapy, Patient/Family education, Balance training, Stair training, Cryotherapy, and Moist heat.  PLAN FOR NEXT SESSION: Review/update HEP PRN. Functional mobility/mechanics. LE strengthening within pt tolerance. Pacing and activity modification as needed, fall risk reduction.  Symptom modification strategies as indicated/appropriate. Mindful of cardiac hx, DVT/PE hx, osteoporosis, and cancer hx    Audie Clear III PT, DPT 04/08/24 11:11 PM

## 2024-04-12 NOTE — Progress Notes (Signed)
 Kindly inform the patient that MR angiogram study of the blood vessels in the brain shows mild narrowing of the blood vessels in deep portions of the brain towards the back and on the top.  No need for change in medical treatment at this time. MR angiogram study of the neck shows no significant blockage of the large arteries in the neck.

## 2024-04-12 NOTE — Progress Notes (Signed)
 Kindly inform the patient that brain MRI scan shows changes of age-related hardening of the arteries and shrinkage of the brain.  There are also incidental changes of sinusitis.

## 2024-04-14 ENCOUNTER — Telehealth: Payer: Self-pay | Admitting: Neurology

## 2024-04-14 ENCOUNTER — Encounter (HOSPITAL_BASED_OUTPATIENT_CLINIC_OR_DEPARTMENT_OTHER): Payer: Self-pay | Admitting: Physical Therapy

## 2024-04-14 ENCOUNTER — Ambulatory Visit (HOSPITAL_BASED_OUTPATIENT_CLINIC_OR_DEPARTMENT_OTHER): Admitting: Physical Therapy

## 2024-04-14 DIAGNOSIS — M6281 Muscle weakness (generalized): Secondary | ICD-10-CM | POA: Diagnosis not present

## 2024-04-14 DIAGNOSIS — R2689 Other abnormalities of gait and mobility: Secondary | ICD-10-CM | POA: Diagnosis not present

## 2024-04-14 NOTE — Therapy (Signed)
 OUTPATIENT PHYSICAL THERAPY TREATMENT   Patient Name: Bruce Ball MRN: [id number] DOB:11/03/1946, 78 y.o., male Today's Date: 04/14/2024  END OF SESSION:  PT End of Session - 04/14/24 1355     Visit Number 4    Number of Visits 17    Date for PT Re-Evaluation 05/06/24    Authorization Type BCBS medicare    PT Start Time 1353    PT Stop Time 1436    PT Time Calculation (min) 43 min    Equipment Utilized During Treatment --   SPC   Activity Tolerance --   Pt limited due to dizziness   Behavior During Therapy WFL for tasks assessed/performed               Past Medical History:  Diagnosis Date   Anemia associated with chronic renal failure    At high risk for falls    due to gait disorders;   recurrent falls   (05-12-2023  per pt last fall 2 weeks ago)   Chronic anticoagulation    eliquis--- managed by pcp   Coronary artery calcification seen on CAT scan    followed by dr t. turner---   05-31-2021  calcium score= 152   Deafness in right ear    ED (erectile dysfunction)    Edema of both lower extremities    Essential tremor    Gait disorder    imbalance when walking uses cane/ walker;  recurrent falls   GERD (gastroesophageal reflux disease)    Hematuria    Hiatal hernia    History of adenomatous polyp of colon    History of agent Orange exposure    History of basal cell carcinoma (BCC) excision    History of DVT of lower extremity 12/2019   admission in epic ;   w/ PE's all lobes   History of esophageal dilatation 12/2019   dr General Kenner   History of kidney stones    History of peptic ulcer 2009   History of prostate cancer 12/2001   followed by dr Secundino Dach;   first dx 01/ 2003,  Gleason 3+3,   03-10-2002  s/p  radical prostatectomy pelvic lymph node disections , no recurrence   History of pulmonary embolus (PE) 12/2019   admission in epic;   submassive all lobes and dvt -lle   History of sarcoma of bone 1970   orthopedic oncologist--- dr w. 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 turner;  monitor in epic 04-08-2023 low PVC burden and atrial ectopy   Renal calculi    bilateral   Stage 3 chronic kidney disease [facility])    nephrologist--- dr Edson Graces   Thyroid nodule 2016   endocrinologist--- dr Marga Share  [facility])  hx benign bx, right side   Urinary incontinence    total incontinence   Wears hearing aid in left ear    Past Surgical History:  Procedure Laterality Date   CYSTOSCOPY WITH  RETROGRADE PYELOGRAM, URETEROSCOPY AND STENT PLACEMENT Right 11/06/2017   Procedure: CYSTOSCOPY WITH  STENT PLACEMENT RIGHT;  Surgeon: Heloise Purpura, MD;  Location: [facility];  Service: Urology;  Laterality: Right;   CYSTOSCOPY WITH RETROGRADE PYELOGRAM, URETEROSCOPY AND STENT PLACEMENT Left 06/09/2019   Procedure: CYSTOSCOPY WITH RETROGRADE PYELOGRAM, URETEROSCOPY AND STENT PLACEMENT X2;  Surgeon: Sebastian Ache, MD;  Location: [facility];  Service: Urology;  Laterality: Left;   CYSTOSCOPY WITH RETROGRADE PYELOGRAM, URETEROSCOPY AND STENT PLACEMENT Bilateral 03/01/2020   Procedure: CYSTOSCOPY WITH RETROGRADE PYELOGRAM, URETEROSCOPY AND STENT PLACEMENT;  Surgeon: Sebastian Ache, MD;  Location: [facility];  Service: Urology;  Laterality: Bilateral;  75 MINS   CYSTOSCOPY WITH RETROGRADE PYELOGRAM, URETEROSCOPY AND STENT PLACEMENT Bilateral 05/16/2023   Procedure: CYSTOSCOPY WITH RETROGRADE PYELOGRAM, URETEROSCOPY AND STENT PLACEMENT;  Surgeon: Loletta Parish., MD;  Location: [facility];  Service: Urology;  Laterality: Bilateral;  90 MINS   CYSTOSCOPY/RETROGRADE/URETEROSCOPY  2020   at [facility] in [place]   CYSTOSCOPY/URETEROSCOPY/HOLMIUM LASER Right 11/24/2017   Procedure: CYSTOSCOPY/URETEROSCOPY/ RETROGRADE/STENT REMOVAL;  Surgeon: Heloise Purpura, MD;  Location: [facility];  Service: Urology;  Laterality: Right;   FEMORAL OSTEOTOMY W/ RODDING  1970   right proximal femUr resection replacement w/ custom prosthesis   FEMUR FRACTURE SURGERY Right 10/17/2021   @ [facility] by dr w. eward;  REVISION RIGHT PROXIMAL FEMUR REPLACEMENT IN SETTING OF DISTAL PERIPROSTHETIC FEMUR FRACTURE, BOTH COMPONENTS   FUNCTIONAL ENDOSCOPIC SINUS SURGERY  03/19/2023   @ [facility]   HOLMIUM LASER APPLICATION Bilateral 03/01/2020   Procedure: HOLMIUM LASER APPLICATION;  Surgeon: Sebastian Ache, MD;  Location: [facility];  Service: Urology;  Laterality: Bilateral;   HOLMIUM LASER APPLICATION Bilateral 05/16/2023   Procedure: HOLMIUM LASER APPLICATION;  Surgeon: Loletta Parish., MD;  Location: [facility];  Service: Urology;  Laterality: Bilateral;   LUMBAR SPINE SURGERY  07/2017   @ [facility]  by dr Newell Coral;    L5-- S1  laminectomy   POSTERIOR LUMBAR FUSION  06/28/2020   @ [facility] in [place];   L5--S1   PROSTATECTOMY  03/10/2002   @[facility]  by dr Aldean Ast;    w/ pelvic lymph node dissection's   SPINAL CORD STIMULATOR REMOVAL  04/15/2016   W/   THORACIC PARTIAL LAMINECTOMY T9   Patient Active Problem List   Diagnosis Date Noted   Fall 01/05/2024   UTI due to Klebsiella species 11/11/2022   Basal cell carcinoma of scalp 09/16/2022   Deposits (accretions) on teeth 09/16/2022   Nontoxic single thyroid nodule 09/16/2022   Thyroid nodule 09/16/2022   Sensorineural hearing loss, bilateral 09/16/2022   Post-COVID syndrome 09/16/2022   Chronic pansinusitis 07/11/2022   Non-seasonal allergic rhinitis 06/11/2022    Abrasion of right arm 11/19/2021   Femur fracture, right (HCC) 10/17/2021   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HCC) 10/16/2021   History of pulmonary embolism 10/10/2021   Chronic diastolic CHF (congestive heart failure) (HCC) 10/10/2021   CKD (chronic kidney disease), stage III (HCC) 10/10/2021   Hyperlipidemia 10/10/2021   CAD (coronary artery disease) 04/03/2021   Chronic anticoagulation 03/26/2021   Acquired coagulation disorder (HCC) 12/12/2020   Senile purpura (HCC) 12/12/2020   Meibomian gland dysfunction of right eye, unspecified eyelid 07/11/2020   Vitamin B12 deficient megaloblastic anemia 07/11/2020   Aortic atherosclerosis (HCC) 05/17/2020   B12 deficiency 05/17/2020   Iliac artery aneurysm (HCC) 05/17/2020   Osteoporosis 04/04/2020   Idiopathic neuropathy 01/28/2020   Recurrent falls 01/28/2020   GERD (gastroesophageal reflux disease)  12/22/2019   Essential tremor 12/22/2019   Kidney stone 11/05/2017   History of total right hip replacement 03/08/2017   History of prostate cancer 03/08/2017   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   Vertigo 03/01/2017   High risk medication use 02/28/2017   DDD (degenerative disc disease), thoracic 02/28/2017   Syncope 02/28/2017   Ataxia 05/18/2016   Vestibular disequilibrium 05/18/2016   Failed spinal cord stimulator (HCC) 03/20/2016   Lung nodule, solitary 05/24/2014   Peripheral edema 06/08/2012   Backache 04/21/2008   History of peptic ulcer disease 04/21/2008   Essential hypertension 10/29/2007    PCP: Almira Jaeger, MD  REFERRING PROVIDER: Almira Jaeger, MD  REFERRING DIAG: R26.89 (ICD-10-CM) - Balance disorder R53.1 (ICD-10-CM) - Generalized weakness  Rationale for Evaluation and Treatment: Rehabilitation  THERAPY DIAG:  Muscle weakness (generalized)  Other abnormalities of gait and mobility  ONSET DATE: chronic - slowly worsening over time   SUBJECTIVE:                                                                                                                                                                                            SUBJECTIVE STATEMENT: Pt denies any adverse effects after prior Rx.  Pt denies pain currently.  Pt states he has felt much better since taking the prednisone.  Pt has been doing more.  Pt did a fair amount of walking yesterday and felt fine. Pt returned to the pool today and attended an arthritic class.  He had no pain with the pool exercises.      PERTINENT HISTORY:  anemia, LE edema, essential tremor, GERD, basal cell carcinoma, hx DVT/PE, hx bone sarcoma, HTN, osteoporosis, CKD3, hx incontinence, CAD, CHF  R Hip surgery/proximal femur replacement many years ago with THA revision in the setting of a periprosthetic distal femur fx 10/17/2021  L5-S1 fusion 2021  PAIN:  0/10 anterior hip pain and lumbar pain   PRECAUTIONS: fall risk, osteoporosis, cardiac hx  WEIGHT BEARING RESTRICTIONS: No  FALLS:  Has patient fallen in last 6 months? Yes. Number of falls ~5 falls ; pt reports most recent in January, does not recall episode  LIVING ENVIRONMENT: No STE, multilevel home main level livable Lives w/ wife who does majority of housework per pt Uses cane vs rollator (usually the latter)  OCCUPATION: retired  - was an Airline pilot  PLOF: mod I for basic mobility  PATIENT GOALS: improve balance and strength  NEXT MD VISIT: TBD  OBJECTIVE:  Note: Objective measures were completed at Evaluation unless otherwise noted.  DIAGNOSTIC FINDINGS:  01/05/24 CTH, CT abd/pelvis, CT chest PE, CT cervical spine ;  refer to [facility] for details   TREATMENT DATE:   4/16 Therapeutic Exercise: Reviewed pt presentation, response to prior Rx, and pain level.  LAQ AROM 3x10 with 3# bilat Seated HS curls with GTB 3 x 10 bilat   sit to stands 2x10 from table with chair in front of him Pt ambulated with SPC 261 ft with cuing for posture and to increase R foot clearance.  Ambulating with  SPC around cones with SBA                                                                                                     At end of Rx:  BP:  132/79  HR:   72   PATIENT EDUCATION:  Education details:  Exercise form, relevant anatomy, exercise rationale, vitals, gait, and POC.  Person educated: Patient Education method: Explanation, Demonstration, Tactile cues, Verbal cues Education comprehension: verbalized understanding, returned demonstration, verbal cues required, tactile cues required, and needs further education    HOME EXERCISE PROGRAM: Access Code: [email or web address] URL: [email or web address] Date: 03/11/2024 Prepared by: Fransisco Hertz  Program Notes - please use counter support for balance  Exercises - Semi-Tandem Balance at Counter Top Eyes Open  - 2-3 x daily - 1 sets - 1-2 reps - 20-30sec hold - Standing March with Counter Support  - 2-3 x daily - 1 sets - 8 reps  ASSESSMENT:  CLINICAL IMPRESSION: Treatment was limited today due to pt becoming dizzy during treatment.  Pt states he has been doing more lately including increased walking.  He also did an aquatic class earlier today.  PT worked on gait and pt has decreased foot clearance on R and decreased gait speed.  Pt had improved R foot clearance with cuing.  Pt reports feeling dizzy after walking around cones in the hallway.  Pt sat down and rested while dizziness improved.  Pt had one occasion of R hip pain when standing to walk to the table after sitting down and resting.  His pain went away and had he no pain with seated exercises.  Pt was going to perform some standing exercises though felt dizzy again when standing.  PT had pt sit down and assessed BP which was fine.  Pt rested and then felt better when standing.  PT didn't perform anymore exercises and ended treatment.  PT asked pt if he wanted [name] to call someone to come pick him up and he replied no stating he felt fine.  PT walked pt out of the clinic and he  was steady having no LOB.  Pt states he feels much better and feels fine to go home.  He should benefit from continued skilled PT to improve balance, strength, gait, and functional mobility.     OBJECTIVE IMPAIRMENTS: Abnormal gait, decreased activity tolerance, decreased balance, decreased endurance, decreased mobility, difficulty walking, decreased ROM, decreased strength, improper body mechanics, postural dysfunction, and pain.   ACTIVITY LIMITATIONS: carrying, lifting, bending, standing, squatting, stairs, transfers, and locomotion level  PARTICIPATION LIMITATIONS: meal prep, cleaning, laundry, driving, and community activity  PERSONAL FACTORS: Age, Time since onset  of injury/illness/exacerbation, and 3+ comorbidities: anemia, LE edema, essential tremor, GERD, basal cell carcinoma, hx DVT/PE, hx bone sarcoma, HTN, osteoporosis, CKD3, hx incontinence, CAD, CHF  are also affecting patient's functional outcome.   REHAB POTENTIAL: Fair given chronicity and comorbidities  CLINICAL DECISION MAKING: Evolving/moderate complexity  EVALUATION COMPLEXITY: Moderate   GOALS:  SHORT TERM GOALS: Target date: 04/08/2024  Pt will demonstrate appropriate understanding and performance of initially prescribed HEP in order to facilitate improved independence with management of symptoms.  Baseline: HEP established  Goal status: INITIAL   2. Pt will be able to perform 4 sit to stands in >/= 30 sec in order to demonstrate improved activity tolerance and safety w/ transfers.  Baseline: 2 reps w/ UE support  Goal status: INITIAL  LONG TERM GOALS: Target date: 05/06/2024   Pt will endorse ability to walk greater than 344ft with LRAD and less than 5/10 pain on NPS in order to facilitate improved safety w/ community navigation. Baseline: reports 9-10/10 pain in R hip with ambulation from lobby to treatment room Goal status: INITIAL  2.  Pt will demonstrate at least 1 grade improvement in BIL LE MMT in order  to facilitate improved functional strength.  Baseline: see MMT chart above  Goal status: INITIAL   3. Pt will be able to perform TUG in less than or equal to 20 sec in order to indicate reduced risk of falling (cutoff score for fall risk 13.5 sec in community dwelling older adults per [facility] et al, 2000)  Baseline: 35sec SPC  Goal status: INITIAL    4. Pt will be able to perform at least 8 repetitions during 30 second sit to stand test in order to demonstrate improved exercise/activity tolerance (cutoff score for low exercise tolerance 18 repetitions in males and 16 repetitions in females per Jolie Neat al 2024)  Baseline: 2 repetitions, with UE support  Goal status: INITIAL    5. Pt will demonstrate appropriate performance of final prescribed HEP in order to facilitate improved self-management of symptoms post-discharge.   Baseline: initial HEP prescribed  Goal status: INITIAL     PLAN:  PT FREQUENCY: 1-2x/week  PT DURATION: 8 weeks  PLANNED INTERVENTIONS: [id number]- PT Re-evaluation, 97110-Therapeutic exercises, 97530- Therapeutic activity, [id number]- Neuromuscular re-education, 97535- Self Care, 16109- Manual therapy, [phone]- Gait training, [phone]- Aquatic Therapy, Patient/Family education, Balance training, Stair training, Cryotherapy, and Moist heat.  PLAN FOR NEXT SESSION: Review/update HEP PRN. Functional mobility/mechanics. LE strengthening within pt tolerance. Pacing and activity modification as needed, fall risk reduction. Symptom modification strategies as indicated/appropriate. Mindful of cardiac hx, DVT/PE hx, osteoporosis, and cancer hx    Trina Fujita III PT, DPT 04/14/24 3:42 PM

## 2024-04-14 NOTE — Telephone Encounter (Signed)
 Called the patient and reviewed the MRI brain and MRA imaging of the brain and neck. Pt verbalized understanding. Pt had no questions at this time but was encouraged to call back if questions arise.

## 2024-04-14 NOTE — Telephone Encounter (Signed)
-----   Message from Ardella Beaver sent at 04/12/2024  5:22 PM EDT ----- Louann Rous inform the patient that MR angiogram study of the blood vessels in the brain shows mild narrowing of the blood vessels in deep portions of the brain towards the back and on the top.  No need for change in medical treatment at this time. MR angiogram study of the neck shows no significant blockage of the large arteries in the neck.

## 2024-04-22 DIAGNOSIS — M25551 Pain in right hip: Secondary | ICD-10-CM | POA: Diagnosis not present

## 2024-04-22 DIAGNOSIS — G8929 Other chronic pain: Secondary | ICD-10-CM | POA: Diagnosis not present

## 2024-05-21 DIAGNOSIS — G8929 Other chronic pain: Secondary | ICD-10-CM | POA: Diagnosis not present

## 2024-05-21 DIAGNOSIS — Z1331 Encounter for screening for depression: Secondary | ICD-10-CM | POA: Diagnosis not present

## 2024-05-21 DIAGNOSIS — M25551 Pain in right hip: Secondary | ICD-10-CM | POA: Diagnosis not present

## 2024-05-31 ENCOUNTER — Ambulatory Visit: Attending: Family Medicine | Admitting: Cardiovascular Disease

## 2024-05-31 ENCOUNTER — Encounter: Payer: Self-pay | Admitting: Cardiovascular Disease

## 2024-05-31 VITALS — BP 154/70 | HR 54 | Ht 71.0 in | Wt 248.0 lb

## 2024-05-31 DIAGNOSIS — H04123 Dry eye syndrome of bilateral lacrimal glands: Secondary | ICD-10-CM | POA: Diagnosis not present

## 2024-05-31 DIAGNOSIS — I493 Ventricular premature depolarization: Secondary | ICD-10-CM | POA: Insufficient documentation

## 2024-05-31 DIAGNOSIS — I491 Atrial premature depolarization: Secondary | ICD-10-CM | POA: Insufficient documentation

## 2024-05-31 DIAGNOSIS — H532 Diplopia: Secondary | ICD-10-CM | POA: Diagnosis not present

## 2024-05-31 DIAGNOSIS — Z961 Presence of intraocular lens: Secondary | ICD-10-CM | POA: Diagnosis not present

## 2024-05-31 NOTE — Patient Instructions (Signed)
 Medication Instructions:  Your physician recommends that you continue on your current medications as directed. Please refer to the Current Medication list given to you today. *If you need a refill on your cardiac medications before your next appointment, please call your pharmacy*  Follow-Up: At [facility], you and your health needs are our priority.  As part of our continuing mission to provide you with exceptional heart care, our providers are all part of one team.  This team includes your primary Cardiologist (physician) and Advanced Practice Providers or APPs (Physician Assistants and Nurse Practitioners) who all work together to provide you with the care you need, when you need it.  Your next appointment:   As needed follow up   Provider:   Marlane Silver, MD

## 2024-05-31 NOTE — Progress Notes (Signed)
 Electrophysiology Office Note:    Date:  05/31/2024   ID:  Bruce Ball, DOB 1946/05/20, MRN [id number]  PCP:  Almira Jaeger, MD   [facility] Providers Cardiologist:  Gaylyn Keas, MD Electrophysiologist:  Efraim Grange, MD     Referring MD: Almira Jaeger, MD   History of Present Illness:    Bruce Ball is a 78 y.o. male with a hx listed below, significant for pulmonary embolism, stage IIIa chronic kidney disease, hypertension, hyperlipidemia, referred for arrhythmia management.  He has recently been having dizziness.  These episodes are occurring paroxysmally.  He has noticed a correlation between symptoms and low heart rates detected by his Apple watch.  Past Medical History:  Diagnosis Date   Anemia associated with chronic renal failure    At high risk for falls    due to gait disorders;   recurrent falls   (05-12-2023  per pt last fall 2 weeks ago)   Chronic anticoagulation    eliquis --- managed by pcp   Coronary artery calcification seen on CAT scan    followed by dr t. turner---   05-31-2021  calcium  score= 152   Deafness in right ear    ED (erectile dysfunction)    Edema of both lower extremities    Essential tremor    Gait disorder    imbalance when walking uses cane/ walker;  recurrent falls   GERD (gastroesophageal reflux disease)    Hematuria    Hiatal hernia    History of adenomatous polyp of colon    History of agent Orange exposure    History of basal cell carcinoma (BCC) excision    History of DVT of lower extremity 12/2019   admission in epic ;   w/ PE's all lobes   History of esophageal dilatation 12/2019   dr [facility]   History of kidney stones    History of peptic ulcer 2009   History of prostate cancer 12/2001   followed by dr Secundino Dach;   first dx 01/ 2003,  Gleason 3+3,   03-10-2002  s/p  radical prostatectomy pelvic lymph node disections , no recurrence   History of pulmonary embolus (PE) 12/2019   admission in epic;    submassive all lobes and dvt -lle   History of sarcoma of bone 1970   orthopedic oncologist--- dr w. 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 Micael Adas;  monitor in epic 04-08-2023 low PVC burden and atrial ectopy   Renal calculi    bilateral   Stage 3 chronic kidney disease [facility])    nephrologist--- dr Edson Graces   Thyroid  nodule 2016   endocrinologist--- dr Marga Share  [facility])  hx benign bx, right side   Urinary incontinence    total incontinence   Wears hearing aid in left ear     Past Surgical History:  Procedure Laterality Date   CYSTOSCOPY WITH RETROGRADE PYELOGRAM, URETEROSCOPY AND STENT PLACEMENT Right  11/06/2017   Procedure: CYSTOSCOPY WITH  STENT PLACEMENT RIGHT;  Surgeon: Florencio Hunting, MD;  Location: [facility];  Service: Urology;  Laterality: Right;   CYSTOSCOPY WITH RETROGRADE PYELOGRAM, URETEROSCOPY AND STENT PLACEMENT Left 06/09/2019   Procedure: CYSTOSCOPY WITH RETROGRADE PYELOGRAM, URETEROSCOPY AND STENT PLACEMENT X2;  Surgeon: Osborn Blaze, MD;  Location: [facility];  Service: Urology;  Laterality: Left;   CYSTOSCOPY WITH RETROGRADE PYELOGRAM, URETEROSCOPY AND STENT PLACEMENT Bilateral 03/01/2020   Procedure: CYSTOSCOPY WITH RETROGRADE PYELOGRAM, URETEROSCOPY AND STENT PLACEMENT;  Surgeon: Osborn Blaze, MD;  Location: [facility];  Service: Urology;  Laterality: Bilateral;  75 MINS   CYSTOSCOPY WITH RETROGRADE PYELOGRAM, URETEROSCOPY AND STENT PLACEMENT  Bilateral 05/16/2023   Procedure: CYSTOSCOPY WITH RETROGRADE PYELOGRAM, URETEROSCOPY AND STENT PLACEMENT;  Surgeon: Melody Spurling., MD;  Location: [facility];  Service: Urology;  Laterality: Bilateral;  90 MINS   CYSTOSCOPY/RETROGRADE/URETEROSCOPY  2020   at [facility] in [place]   CYSTOSCOPY/URETEROSCOPY/HOLMIUM LASER Right 11/24/2017   Procedure: CYSTOSCOPY/URETEROSCOPY/ RETROGRADE/STENT REMOVAL;  Surgeon: Florencio Hunting, MD;  Location: [facility];  Service: Urology;  Laterality: Right;   FEMORAL OSTEOTOMY W/ RODDING  1970   right proximal femUr resection replacement w/ custom prosthesis   FEMUR FRACTURE SURGERY Right 10/17/2021   @ [facility] by dr w. eward;  REVISION RIGHT PROXIMAL FEMUR REPLACEMENT IN SETTING OF DISTAL PERIPROSTHETIC FEMUR FRACTURE, BOTH COMPONENTS   FUNCTIONAL ENDOSCOPIC SINUS SURGERY  03/19/2023   @ [facility]   HOLMIUM LASER APPLICATION Bilateral 03/01/2020   Procedure: HOLMIUM LASER APPLICATION;  Surgeon: Osborn Blaze, MD;  Location: [facility];  Service: Urology;  Laterality: Bilateral;   HOLMIUM LASER APPLICATION Bilateral 05/16/2023   Procedure: HOLMIUM LASER APPLICATION;  Surgeon: Melody Spurling., MD;  [facility];  Service: Urology;  Laterality: Bilateral;   LUMBAR SPINE SURGERY  07/2017   @ [facility]  by dr Cipriano Creeks;    L5-- S1  laminectomy   POSTERIOR LUMBAR FUSION  06/28/2020   @ [facility] in [place];   L5--S1   PROSTATECTOMY  03/10/2002   @[facility]  by dr Enrigue Harvard;    w/ pelvic lymph node dissection's   SPINAL CORD STIMULATOR REMOVAL  04/15/2016   W/   THORACIC PARTIAL LAMINECTOMY T9    Current Medications: Current Meds  Medication Sig   acetaminophen  (TYLENOL ) 500 MG tablet Take 2 tablets (1,000 mg total) by mouth every 6 (six) hours as needed for headache, fever, moderate pain (pain score 4-6) or mild pain (pain score 1-3).   apixaban  (ELIQUIS ) 2.5 MG TABS tablet Take 1 tablet (2.5 mg total) by mouth 2  (two) times daily.   atorvastatin  (LIPITOR) 20 MG tablet Take 1 tablet (20 mg total) by mouth at bedtime. (Patient taking differently: Take 20 mg by mouth at bedtime.)   Budesonide 2 MG/10ML SUSP Take by mouth. 1/2 twice a day   carvedilol  (COREG ) 25 MG tablet Take 25 mg by mouth 2 (two) times daily with a meal.   cetirizine (ZYRTEC) 10 MG tablet Take 10 mg by mouth as needed for allergies.   Cholecalciferol  (VITAMIN D3) 50 MCG (2000 UT) capsule Take 2,000 Units by mouth in the morning and at bedtime.    DULoxetine  (CYMBALTA ) 30 MG capsule Take 30 mg by mouth daily.   fluticasone  (FLONASE ) 50 MCG/ACT nasal spray Place 1-2 sprays into both nostrils daily as needed for allergies or rhinitis.   furosemide  (LASIX ) 20 MG tablet Take 20 mg by mouth daily as needed for  fluid (if the feet and/or legs swell).   HYDROmorphone  (DILAUDID ) 4 MG tablet Take 0.5 tablets (2 mg total) by mouth every 12 (twelve) hours as needed for severe pain (pain score 7-10).   methocarbamol  (ROBAXIN ) 500 MG tablet Take 1 tablet (500 mg total) by mouth every 8 (eight) hours as needed for muscle spasms.   metoCLOPramide  (REGLAN ) 10 MG tablet Take by mouth.   Multiple Vitamin (MULTIVITAMIN) capsule Take 1 capsule by mouth daily.   pantoprazole  (PROTONIX ) 40 MG tablet Take 40 mg by mouth 2 (two) times daily before a meal.   potassium citrate  (UROCIT-K ) 10 MEQ (1080 MG) SR tablet Take 1 tablet (10 mEq total) by mouth as directed.     Allergies:   Tape, Erythromycin, Meclizine , and Lisinopril   Social and Family History: Reviewed in Epic  ROS:   Please see the history of present illness.    All other systems reviewed and are negative.  EKGs/Labs/Other Studies Reviewed Today:    Echocardiogram:  TTE 09/20/2022  1. Left ventricular ejection fraction, by estimation, is 65 to 70%. The  left ventricle has normal function. The left ventricle has no regional  wall motion abnormalities. Left ventricular diastolic parameters are   indeterminate.   2. Right ventricular systolic function is normal. The right ventricular  size is normal.   3. Left atrial size was mildly dilated.   4. The mitral valve is normal in structure. Trivial mitral valve  regurgitation. No evidence of mitral stenosis.   5. The aortic valve is tricuspid. There is mild calcification of the  aortic valve. There is mild thickening of the aortic valve. Aortic valve  regurgitation is trivial. Aortic valve sclerosis is present, with no  evidence of aortic valve stenosis.   Monitors:  5-day monitor March 2024.   Sinus heart rate 43 to 97 bpm, average 58 5.6% PVC burden  Advanced imaging:  CT 05/31/21 1. Coronary calcium  score of 152. This was 43rd percentile for age-, race-, and sex-matched controls. 2. Extensive aortic arch and descending aorta atherosclerosis. Aortic valve leaflet calcifications  EKG:  Last EKG results: today sinus rhythm with frequent PVCs and PACs   Recent Labs: 01/05/2024: B Natriuretic Peptide 58.3; Magnesium 1.9; TSH 0.440 01/12/2024: ALT 21; BUN 19; Creatinine, Ser 1.38; Hemoglobin 14.9; Platelets 225.0; Potassium 3.9; Sodium 143     Physical Exam:    VS:  BP (!) 154/70 (BP Location: Right Arm, Patient Position: Sitting, Cuff Size: Large)   Pulse (!) 54   Ht 5\' 11"  (1.803 m)   Wt 248 lb (112.5 kg)   SpO2 96%   BMI 34.59 kg/m     Wt Readings from Last 3 Encounters:  05/31/24 248 lb (112.5 kg)  04/02/24 235 lb (106.6 kg)  03/18/24 249 lb (112.9 kg)     GEN:  Well nourished, well developed in no acute distress CARDIAC: irregular rhythm, no murmurs, rubs, gallops.   He was feeling dizzy while I auscultated his heart; he was having frequent ectopy.   RESPIRATORY:  Normal work of breathing MUSCULOSKELETAL: no edema    ASSESSMENT & PLAN:    PVCs - non-perfusing PVCs are resulting in low heart rates, likely causing dizziness - TSH normal - Continue carvedilol  25 mg twice daily - PVC burden is low, he  denies having any symptoms - He may follow-up with EP should his PVCs become worse  History of PE - on apixaban  5mg  BID        Medication Adjustments/Labs and  Tests Ordered: Current medicines are reviewed at length with the patient today.  Concerns regarding medicines are outlined above.  Orders Placed This Encounter  Procedures   EKG 12-Lead   No orders of the defined types were placed in this encounter.    Signed, Efraim Grange, MD  05/31/2024 10:32 AM     [facility]

## 2024-06-08 ENCOUNTER — Ambulatory Visit: Attending: Family Medicine | Admitting: Physical Therapy

## 2024-06-08 DIAGNOSIS — R2689 Other abnormalities of gait and mobility: Secondary | ICD-10-CM | POA: Diagnosis not present

## 2024-06-08 DIAGNOSIS — R262 Difficulty in walking, not elsewhere classified: Secondary | ICD-10-CM | POA: Diagnosis not present

## 2024-06-08 DIAGNOSIS — R2681 Unsteadiness on feet: Secondary | ICD-10-CM | POA: Diagnosis not present

## 2024-06-08 DIAGNOSIS — M6281 Muscle weakness (generalized): Secondary | ICD-10-CM | POA: Insufficient documentation

## 2024-06-08 NOTE — Therapy (Signed)
 OUTPATIENT PHYSICAL THERAPY TREATMENT/RECERTIFICATION   Patient Name: Bruce Ball MRN: [id number] DOB:07-03-1946, 78 y.o., male Today's Date: 06/08/2024  END OF SESSION:  PT End of Session - 06/08/24 1442     Visit Number 5    Date for PT Re-Evaluation 08/03/24    PT Start Time 1445    PT Stop Time 1527    PT Time Calculation (min) 42 min    Activity Tolerance Patient tolerated treatment well               Past Medical History:  Diagnosis Date   Anemia associated with chronic renal failure    At high risk for falls    due to gait disorders;   recurrent falls   (05-12-2023  per pt last fall 2 weeks ago)   Chronic anticoagulation    eliquis --- managed by pcp   Coronary artery calcification seen on CAT scan    followed by dr t. turner---   05-31-2021  calcium  score= 152   Deafness in right ear    ED (erectile dysfunction)    Edema of both lower extremities    Essential tremor    Gait disorder    imbalance when walking uses cane/ walker;  recurrent falls   GERD (gastroesophageal reflux disease)    Hematuria    Hiatal hernia    History of adenomatous polyp of colon    History of agent Orange exposure    History of basal cell carcinoma (BCC) excision    History of DVT of lower extremity 12/2019   admission in epic ;   w/ PE's all lobes   History of esophageal dilatation 12/2019   dr General Kenner   History of kidney stones    History of peptic ulcer 2009   History of prostate cancer 12/2001   followed by dr Secundino Dach;   first dx 01/ 2003,  Gleason 3+3,   03-10-2002  s/p  radical prostatectomy pelvic lymph node disections , no recurrence   History of pulmonary embolus (PE) 12/2019   admission in epic;   submassive all lobes and dvt -lle   History of sarcoma of bone 1970   orthopedic oncologist--- dr w. 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 Micael Adas;  monitor in epic 04-08-2023 low PVC burden and atrial ectopy   Renal calculi    bilateral   Stage 3 chronic kidney disease [facility])    nephrologist--- dr Edson Graces   Thyroid  nodule 2016   endocrinologist--- dr Marga Share  [facility])  hx benign bx, right side   Urinary incontinence    total incontinence   Wears hearing aid in left ear    Past Surgical History:  Procedure Laterality Date   CYSTOSCOPY WITH RETROGRADE PYELOGRAM, URETEROSCOPY AND STENT PLACEMENT Right 11/06/2017   Procedure: CYSTOSCOPY WITH  STENT PLACEMENT RIGHT;  Surgeon: Florencio Hunting, MD;  Location: [facility];  Service: Urology;  Laterality: Right;   CYSTOSCOPY WITH RETROGRADE  PYELOGRAM, URETEROSCOPY AND STENT PLACEMENT Left 06/09/2019   Procedure: CYSTOSCOPY WITH RETROGRADE PYELOGRAM, URETEROSCOPY AND STENT PLACEMENT X2;  Surgeon: Osborn Blaze, MD;  Location: [facility];  Service: Urology;  Laterality: Left;   CYSTOSCOPY WITH RETROGRADE PYELOGRAM, URETEROSCOPY AND STENT PLACEMENT Bilateral 03/01/2020   Procedure: CYSTOSCOPY WITH RETROGRADE PYELOGRAM, URETEROSCOPY AND STENT PLACEMENT;  Surgeon: Osborn Blaze, MD;  Location: [facility];  Service: Urology;  Laterality: Bilateral;  75 MINS   CYSTOSCOPY WITH RETROGRADE PYELOGRAM, URETEROSCOPY AND STENT PLACEMENT Bilateral 05/16/2023   Procedure: CYSTOSCOPY WITH RETROGRADE PYELOGRAM, URETEROSCOPY AND STENT PLACEMENT;  Surgeon: Melody Spurling., MD;  Location: [facility];  Service: Urology;  Laterality: Bilateral;  90 MINS   CYSTOSCOPY/RETROGRADE/URETEROSCOPY  2020    at [facility] in [place]   CYSTOSCOPY/URETEROSCOPY/HOLMIUM LASER Right 11/24/2017   Procedure: CYSTOSCOPY/URETEROSCOPY/ RETROGRADE/STENT REMOVAL;  Surgeon: Florencio Hunting, MD;  Location: [facility];  Service: Urology;  Laterality: Right;   FEMORAL OSTEOTOMY W/ RODDING  1970   right proximal femUr resection replacement w/ custom prosthesis   FEMUR FRACTURE SURGERY Right 10/17/2021   @ [facility] by dr w. eward;  REVISION RIGHT PROXIMAL FEMUR REPLACEMENT IN SETTING OF DISTAL PERIPROSTHETIC FEMUR FRACTURE, BOTH COMPONENTS   FUNCTIONAL ENDOSCOPIC SINUS SURGERY  03/19/2023   @ [facility]   HOLMIUM LASER APPLICATION Bilateral 03/01/2020   Procedure: HOLMIUM LASER APPLICATION;  Surgeon: Osborn Blaze, MD;  Location: [facility];  Service: Urology;  Laterality: Bilateral;   HOLMIUM LASER APPLICATION Bilateral 05/16/2023   Procedure: HOLMIUM LASER APPLICATION;  Surgeon: Melody Spurling., MD;  Location: [facility];  Service: Urology;  Laterality: Bilateral;   LUMBAR SPINE SURGERY  07/2017   @ [facility]  by dr Cipriano Creeks;    L5-- S1  laminectomy   POSTERIOR LUMBAR FUSION  06/28/2020   @ [facility] in [place];   L5--S1   PROSTATECTOMY  03/10/2002   @[facility]  by dr Enrigue Harvard;    w/ pelvic lymph node dissection's   SPINAL CORD STIMULATOR REMOVAL  04/15/2016   W/   THORACIC PARTIAL LAMINECTOMY T9   Patient Active Problem List   Diagnosis Date Noted   Fall 01/05/2024   UTI due to Klebsiella species 11/11/2022   Basal cell carcinoma of scalp 09/16/2022   Deposits (accretions) on teeth 09/16/2022   Nontoxic single thyroid  nodule 09/16/2022   Thyroid  nodule 09/16/2022   Sensorineural hearing loss, bilateral 09/16/2022   Post-COVID syndrome 09/16/2022   Chronic pansinusitis 07/11/2022   Non-seasonal allergic rhinitis 06/11/2022   Abrasion of right arm 11/19/2021   Femur fracture, right (HCC) 10/17/2021   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HCC) 10/16/2021   History of pulmonary embolism 10/10/2021    Chronic diastolic CHF (congestive heart failure) (HCC) 10/10/2021   CKD (chronic kidney disease), stage III (HCC) 10/10/2021   Hyperlipidemia 10/10/2021   CAD (coronary artery disease) 04/03/2021   Chronic anticoagulation 03/26/2021   Acquired coagulation disorder (HCC) 12/12/2020   Senile purpura (HCC) 12/12/2020   Meibomian gland dysfunction of right eye, unspecified eyelid 07/11/2020   Vitamin B12 deficient megaloblastic anemia 07/11/2020   Aortic atherosclerosis (HCC) 05/17/2020   B12 deficiency 05/17/2020   Iliac artery aneurysm (HCC) 05/17/2020   Osteoporosis 04/04/2020   Idiopathic neuropathy 01/28/2020   Recurrent falls 01/28/2020   GERD (gastroesophageal reflux disease) 12/22/2019   Essential tremor 12/22/2019   Kidney stone 11/05/2017   History of total right hip replacement 03/08/2017   History of prostate cancer 03/08/2017   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   Vertigo 03/01/2017  High risk medication use 02/28/2017   DDD (degenerative disc disease), thoracic 02/28/2017   Syncope 02/28/2017   Ataxia 05/18/2016   Vestibular disequilibrium 05/18/2016   Failed spinal cord stimulator (HCC) 03/20/2016   Lung nodule, solitary 05/24/2014   Peripheral edema 06/08/2012   Backache 04/21/2008   History of peptic ulcer disease 04/21/2008   Essential hypertension 10/29/2007    PCP: Almira Jaeger, MD  REFERRING PROVIDER: Almira Jaeger, MD  REFERRING DIAG: R26.89 (ICD-10-CM) - Balance disorder R53.1 (ICD-10-CM) - Generalized weakness  Rationale for Evaluation and Treatment: Rehabilitation  THERAPY DIAG:  Muscle weakness (generalized)  Other abnormalities of gait and mobility  Difficulty in walking, not elsewhere classified  ONSET DATE: chronic - slowly worsening over time   SUBJECTIVE:                                                                                                                                                                                            SUBJECTIVE STATEMENT: Unable to get appts until August for PT at [facility].  Under pain management at [facility] for right hip pain, next appt in July for injection to lateral femoral cutaneous nerve.   Cymbalta  helps with pain but makes him dizzy.  Goes to aquatic ex 3x/week.  Most concerned about dizziness and balance.  Had several falls. None since January. Presents with SPC.  Uses walker at home so can use tray.  Uses walker to go to the pool.  100 yards walking tolerance with walker.  Has gone to events at [place].  Hard to get up and down from chair.    Goes by Caretha Chapel although his wife calls him Berta Brittle  Had PT with Camilo Cella and Kathyanne Parkers at [facility]  PERTINENT HISTORY:  anemia, LE edema, essential tremor, GERD, basal cell carcinoma, hx DVT/PE, hx bone sarcoma, HTN, osteoporosis, CKD3, hx incontinence, CAD, CHF Peripheral neuropathy Right ear hearing impairment  R Hip surgery/proximal femur replacement many years ago with THA revision in the setting of a periprosthetic distal femur fx 10/17/2021  L5-S1 fusion 2021  PAIN:  0/10 anterior hip pain and lumbar pain   PRECAUTIONS: fall risk, osteoporosis, cardiac hx  WEIGHT BEARING RESTRICTIONS: No  FALLS:  Has patient fallen in last 6 months? Yes. Number of falls ~5 falls ; pt reports most recent in January, does not recall episode  LIVING ENVIRONMENT: No STE, multilevel home main level livable Lives w/ wife who does majority of housework per pt Uses cane vs rollator (usually the latter)  OCCUPATION: retired  - was an Airline pilot  PLOF: mod I for basic mobility  PATIENT GOALS: improve balance and strength  NEXT MD VISIT: TBD  OBJECTIVE:  Note: Objective measures were completed at Evaluation unless otherwise noted.  DIAGNOSTIC FINDINGS:  01/05/24 CTH, CT abd/pelvis, CT chest PE, CT cervical spine ; refer to EPIC for details   06/08/24: 30 sec STS with hands 10x TUG 25.56 with cane      TREATMENT DATE:  6/10: 30 sec  STS with hands 10x TUG 25.56 with cane  Able to rise 1x without UE assist Attempted 3 MWT with cane; then extreme dizziness requiring mod assist to stabilize; pt needed a chair to recover from dizziness; continued with 2 min walk with clinic RW (wheelchair follow close behind for safety) Recommend use of RW full time for safety Gait observations:  shuffled steps, right hip pelvic drop, increased toe out on right  Pt borrowed clinic RW to stand to schedule appts and safely get to the car post session   4/16 Therapeutic Exercise: Reviewed pt presentation, response to prior Rx, and pain level.  LAQ AROM 3x10 with 3# bilat Seated HS curls with GTB 3 x 10 bilat   sit to stands 2x10 from table with chair in front of him Pt ambulated with SPC 261 ft with cuing for posture and to increase R foot clearance.  Ambulating with SPC around cones with SBA                                                                                                     At end of Rx:  BP:  132/79  HR:   72   PATIENT EDUCATION:  Education details:  Exercise form, relevant anatomy, exercise rationale, vitals, gait, and POC.  Person educated: Patient Education method: Explanation, Demonstration, Tactile cues, Verbal cues Education comprehension: verbalized understanding, returned demonstration, verbal cues required, tactile cues required, and needs further education    HOME EXERCISE PROGRAM: Access Code: [email or web address] URL: [email or web address] Date: 03/11/2024 Prepared by: Mayme Spearman  Program Notes - please use counter support for balance  Exercises - Semi-Tandem Balance at Counter Top Eyes Open  - 2-3 x daily - 1 sets - 1-2 reps - 20-30sec hold - Standing March with Counter Support  - 2-3 x daily - 1 sets - 8 reps  ASSESSMENT:  CLINICAL IMPRESSION: The patient returns after a gap in care since last visit.  He has been able to continue with aquatic ex 3x/week in the interim.  His hip pain  is much better on Cymbalta  however this medication causes significant dizziness putting him at even higher risk of falls.  He arrives with a single point cane [facility]).  With attempted 3 min walk test, he becomes so dizzy he begins to stagger requiring mod assist to keep him standing and to bring a chair close by to sit down.  After a recovery period he is able to continue walking using the clinic rolling walker.  He has improved significantly with 30 sec sit to stand test and TUG test times. The patient would benefit from a continuation of skilled PT for a further progression of strengthening and functional mobility.  Will continue to update  and promote independence in a HEP needed for a return to the highest functional level possible with ADLs.       OBJECTIVE IMPAIRMENTS: Abnormal gait, decreased activity tolerance, decreased balance, decreased endurance, decreased mobility, difficulty walking, decreased ROM, decreased strength, improper body mechanics, postural dysfunction, and pain.   ACTIVITY LIMITATIONS: carrying, lifting, bending, standing, squatting, stairs, transfers, and locomotion level  PARTICIPATION LIMITATIONS: meal prep, cleaning, laundry, driving, and community activity  PERSONAL FACTORS: Age, Time since onset of injury/illness/exacerbation, and 3+ comorbidities: anemia, LE edema, essential tremor, GERD, basal cell carcinoma, hx DVT/PE, hx bone sarcoma, HTN, osteoporosis, CKD3, hx incontinence, CAD, CHF are also affecting patient's functional outcome.   REHAB POTENTIAL: Fair given chronicity and comorbidities  CLINICAL DECISION MAKING: Evolving/moderate complexity  EVALUATION COMPLEXITY: Moderate   GOALS:  SHORT TERM GOALS: Target date: 04/08/2024  Pt will demonstrate appropriate understanding and performance of initially prescribed HEP in order to facilitate improved independence with management of symptoms.  Baseline: HEP established  Goal status: INITIAL   2. Pt will be  able to perform 4 sit to stands in >/= 30 sec in order to demonstrate improved activity tolerance and safety w/ transfers.  Baseline: 2 reps w/ UE support  Goal status: INITIAL  LONG TERM GOALS: Target date: 08/03/2024     The patient will have improved gait stamina and speed needed to ambulate 600 feet in 6 minutes with RW Goal status: revised  2.  The patient will have improved hip strength to at least 4+/5 needed for standing, walking longer distances and negotiating curbs  Goal status: revised  3. Pt will be able to perform TUG in less than or equal to 20 sec in order to indicate reduced risk of falling (cutoff score for fall risk 13.5 sec in community dwelling older adults per [facility] et al, 2000)  Baseline: 35sec SPC initial, 25 sec on 6/10  Goal status: ongoing    4. Pt will be able to perform at least 8 repetitions during 30 second sit to stand test in order to demonstrate improved exercise/activity tolerance (cutoff score for low exercise tolerance 18 repetitions in males and 16 repetitions in females per Jolie Neat al 2024)  Baseline: 2 repetitions, with UE support  Goal status: met 6/10   5. Pt will demonstrate appropriate performance of final prescribed HEP in order to facilitate improved self-management of symptoms post-discharge.   Baseline: initial HEP prescribed  Goal status: ongoing  6. The patient will demonstrate improved LE strength to at least 4+/5 needed to rise from a chair without using arm assistance >50% of the time NEW     PLAN:  PT FREQUENCY: 2x/week  PT DURATION: 8 weeks  PLANNED INTERVENTIONS: 97164- PT Re-evaluation, 97110-Therapeutic exercises, 97530- Therapeutic activity, 97112- Neuromuscular re-education, 97535- Self Care, 16109- Manual therapy, [id number]- Gait training, [id number]- Aquatic Therapy, Patient/Family education, Balance training, Stair training, Cryotherapy, and Moist heat.  PLAN FOR NEXT SESSION: Review/update HEP add STS no UE use.  Right hip strengthening; balance next to bar for safety secondary to high fall risk secondary to dizziness; work on clearing thresholds and step lengths; fall risk reduction. Symptom modification strategies as indicated/appropriate. Mindful of cardiac hx, DVT/PE hx, osteoporosis, and cancer hx   Darien Eden, PT 06/08/24 7:10 PM Phone: [phone] Fax: [phone]

## 2024-06-10 ENCOUNTER — Ambulatory Visit: Admitting: Physical Therapy

## 2024-06-10 DIAGNOSIS — R2689 Other abnormalities of gait and mobility: Secondary | ICD-10-CM | POA: Diagnosis not present

## 2024-06-10 DIAGNOSIS — R262 Difficulty in walking, not elsewhere classified: Secondary | ICD-10-CM | POA: Diagnosis not present

## 2024-06-10 DIAGNOSIS — R2681 Unsteadiness on feet: Secondary | ICD-10-CM | POA: Diagnosis not present

## 2024-06-10 DIAGNOSIS — M6281 Muscle weakness (generalized): Secondary | ICD-10-CM

## 2024-06-10 NOTE — Therapy (Signed)
 OUTPATIENT PHYSICAL THERAPY TREATMENT   Patient Name: Bruce Ball MRN: [id number] DOB:October 23, 1946, 78 y.o., male Today's Date: 06/10/2024  END OF SESSION:  PT End of Session - 06/10/24 1453     Visit Number 6    Number of Visits 17    Date for PT Re-Evaluation 08/03/24    Authorization Type BCBS medicare    PT Start Time 1447    PT Stop Time 1527    PT Time Calculation (min) 40 min    Activity Tolerance Patient tolerated treatment well            Past Medical History:  Diagnosis Date   Anemia associated with chronic renal failure    At high risk for falls    due to gait disorders;   recurrent falls   (05-12-2023  per pt last fall 2 weeks ago)   Chronic anticoagulation    eliquis --- managed by pcp   Coronary artery calcification seen on CAT scan    followed by dr t. turner---   05-31-2021  calcium  score= 152   Deafness in right ear    ED (erectile dysfunction)    Edema of both lower extremities    Essential tremor    Gait disorder    imbalance when walking uses cane/ walker;  recurrent falls   GERD (gastroesophageal reflux disease)    Hematuria    Hiatal hernia    History of adenomatous polyp of colon    History of agent Orange exposure    History of basal cell carcinoma (BCC) excision    History of DVT of lower extremity 12/2019   admission in epic ;   w/ PE's all lobes   History of esophageal dilatation 12/2019   dr General Kenner   History of kidney stones    History of peptic ulcer 2009   History of prostate cancer 12/2001   followed by dr Secundino Dach;   first dx 01/ 2003,  Gleason 3+3,   03-10-2002  s/p  radical prostatectomy pelvic lymph node disections , no recurrence   History of pulmonary embolus (PE) 12/2019   admission in epic;   submassive all lobes and dvt -lle   History of sarcoma of bone 1970   orthopedic oncologist--- dr w. 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 turner;  monitor in epic 04-08-2023 low PVC burden and atrial ectopy   Renal calculi    bilateral   Stage 3 chronic kidney disease [facility])    nephrologist--- dr Edson Graces   Thyroid  nodule 2016   endocrinologist--- dr Marga Share  [facility])  hx benign bx, right side   Urinary incontinence    total incontinence   Wears hearing aid in left ear    Past Surgical History:  Procedure Laterality Date   CYSTOSCOPY WITH RETROGRADE PYELOGRAM, URETEROSCOPY AND STENT PLACEMENT Right 11/06/2017   Procedure: CYSTOSCOPY WITH  STENT PLACEMENT RIGHT;  Surgeon: Florencio Hunting, MD;  Location: [facility];  Service: Urology;  Laterality: Right;   CYSTOSCOPY WITH RETROGRADE PYELOGRAM, URETEROSCOPY AND STENT PLACEMENT Left 06/09/2019   Procedure: CYSTOSCOPY WITH RETROGRADE PYELOGRAM, URETEROSCOPY AND STENT PLACEMENT X2;  Surgeon: Osborn Blaze, MD;  Location: [facility];  Service: Urology;  Laterality: Left;   CYSTOSCOPY WITH RETROGRADE PYELOGRAM, URETEROSCOPY AND STENT PLACEMENT Bilateral 03/01/2020   Procedure: CYSTOSCOPY WITH RETROGRADE PYELOGRAM, URETEROSCOPY AND STENT PLACEMENT;  Surgeon: Osborn Blaze, MD;  Location: [facility];  Service: Urology;  Laterality: Bilateral;  75 MINS   CYSTOSCOPY WITH RETROGRADE PYELOGRAM, URETEROSCOPY AND STENT PLACEMENT Bilateral 05/16/2023   Procedure: CYSTOSCOPY WITH RETROGRADE PYELOGRAM, URETEROSCOPY AND STENT PLACEMENT;  Surgeon: Melody Spurling., MD;  Location: [facility];  Service: Urology;  Laterality: Bilateral;  90 MINS    CYSTOSCOPY/RETROGRADE/URETEROSCOPY  2020   at [facility] in [place]   CYSTOSCOPY/URETEROSCOPY/HOLMIUM LASER Right 11/24/2017   Procedure: CYSTOSCOPY/URETEROSCOPY/ RETROGRADE/STENT REMOVAL;  Surgeon: Florencio Hunting, MD;  Location: [facility];  Service: Urology;  Laterality: Right;   FEMORAL OSTEOTOMY W/ RODDING  1970   right proximal femUr resection replacement w/ custom prosthesis   FEMUR FRACTURE SURGERY Right 10/17/2021   @ [facility] by dr w. eward;  REVISION RIGHT PROXIMAL FEMUR REPLACEMENT IN SETTING OF DISTAL PERIPROSTHETIC FEMUR FRACTURE, BOTH COMPONENTS   FUNCTIONAL ENDOSCOPIC SINUS SURGERY  03/19/2023   @ [facility]   HOLMIUM LASER APPLICATION Bilateral 03/01/2020   Procedure: HOLMIUM LASER APPLICATION;  Surgeon: Osborn Blaze, MD;  Location: [facility];  Service: Urology;  Laterality: Bilateral;   HOLMIUM LASER APPLICATION Bilateral 05/16/2023   Procedure: HOLMIUM LASER APPLICATION;  Surgeon: Melody Spurling., MD;  Location: [facility];  Service: Urology;  Laterality: Bilateral;   LUMBAR SPINE SURGERY  07/2017   @ [facility]  by dr Cipriano Creeks;    L5-- S1  laminectomy   POSTERIOR LUMBAR FUSION  06/28/2020   @ [facility] [place];   L5--S1   PROSTATECTOMY  03/10/2002   @[facility]  by dr Enrigue Harvard;    w/ pelvic lymph node dissection's   SPINAL CORD STIMULATOR REMOVAL  04/15/2016   W/   THORACIC PARTIAL LAMINECTOMY T9   Patient Active Problem List   Diagnosis Date Noted   Fall 01/05/2024   UTI due to Klebsiella species 11/11/2022   Basal cell carcinoma of scalp 09/16/2022   Deposits (accretions) on teeth 09/16/2022   Nontoxic single thyroid  nodule 09/16/2022   Thyroid  nodule 09/16/2022   Sensorineural hearing loss, bilateral 09/16/2022   Post-COVID syndrome 09/16/2022   Chronic pansinusitis 07/11/2022   Non-seasonal allergic rhinitis 06/11/2022   Abrasion of right arm 11/19/2021   Femur fracture, right (HCC) 10/17/2021   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HCC) 10/16/2021    History of pulmonary embolism 10/10/2021   Chronic diastolic CHF (congestive heart failure) (HCC) 10/10/2021   CKD (chronic kidney disease), stage III (HCC) 10/10/2021   Hyperlipidemia 10/10/2021   CAD (coronary artery disease) 04/03/2021   Chronic anticoagulation 03/26/2021   Acquired coagulation disorder (HCC) 12/12/2020   Senile purpura (HCC) 12/12/2020   Meibomian gland dysfunction of right eye, unspecified eyelid 07/11/2020   Vitamin B12 deficient megaloblastic anemia 07/11/2020   Aortic atherosclerosis (HCC) 05/17/2020   B12 deficiency 05/17/2020   Iliac artery aneurysm (HCC) 05/17/2020   Osteoporosis 04/04/2020   Idiopathic neuropathy 01/28/2020   Recurrent falls 01/28/2020   GERD (gastroesophageal reflux disease) 12/22/2019   Essential tremor 12/22/2019   Kidney stone 11/05/2017   History of total right hip replacement 03/08/2017   History of prostate cancer 03/08/2017  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   Vertigo 03/01/2017   High risk medication use 02/28/2017   DDD (degenerative disc disease), thoracic 02/28/2017   Syncope 02/28/2017   Ataxia 05/18/2016   Vestibular disequilibrium 05/18/2016   Failed spinal cord stimulator (HCC) 03/20/2016   Lung nodule, solitary 05/24/2014   Peripheral edema 06/08/2012   Backache 04/21/2008   History of peptic ulcer disease 04/21/2008   Essential hypertension 10/29/2007    PCP: Almira Jaeger, MD  REFERRING PROVIDER: Almira Jaeger, MD  REFERRING DIAG: R26.89 (ICD-10-CM) - Balance disorder R53.1 (ICD-10-CM) - Generalized weakness  Rationale for Evaluation and Treatment: Rehabilitation  THERAPY DIAG:  Muscle weakness (generalized)  Other abnormalities of gait and mobility  Difficulty in walking, not elsewhere classified  ONSET DATE: chronic - slowly worsening over time   SUBJECTIVE:                                                                                                                                                                                            SUBJECTIVE STATEMENT: Dizziness is there all the time.  No hip pain.  Presents with SPC.    Goes by Caretha Chapel although his wife calls him Berta Brittle  Had PT with Camilo Cella and Kathyanne Parkers at [facility]  PERTINENT HISTORY:  anemia, LE edema, essential tremor, GERD, basal cell carcinoma, hx DVT/PE, hx bone sarcoma, HTN, osteoporosis, CKD3, hx incontinence, CAD, CHF Peripheral neuropathy Right ear hearing impairment  R Hip surgery/proximal femur replacement many years ago with THA revision in the setting of a periprosthetic distal femur fx 10/17/2021  L5-S1 fusion 2021  PAIN:  0/10 anterior hip pain and lumbar pain   PRECAUTIONS: fall risk, osteoporosis, cardiac hx  WEIGHT BEARING RESTRICTIONS: No  FALLS:  Has patient fallen in last 6 months? Yes. Number of falls ~5 falls ; pt reports most recent in January, does not recall episode  LIVING ENVIRONMENT: No STE, multilevel home main level livable Lives w/ wife who does majority of housework per pt Uses cane vs rollator (usually the latter)  OCCUPATION: retired  - was an Airline pilot  PLOF: mod I for basic mobility  PATIENT GOALS: improve balance and strength  NEXT MD VISIT: TBD  OBJECTIVE:  Note: Objective measures were completed at Evaluation unless otherwise noted.  DIAGNOSTIC FINDINGS:  01/05/24 CTH, CT abd/pelvis, CT chest PE, CT cervical spine ; refer to EPIC for details   06/08/24: 30 sec STS with hands 10x TUG 25.56 with cane      TREATMENT DATE:  6/12: Nu-Step seat 11, arms 11 L 3   10 min while discussing status (60 spm) set a goal next time  for spm challenge (pt states he likes that) He is regularly doing Sit to stand repetitions at home Sit to stand (unable to rise from regular seat height without arm assist) added foam cushion and able to do without arm assist 10x  Inside // bars: Circle taps rainbow pattern with WB on right, touches with left 10x LE 50% use of arms  for support (pt is colorblind) WB on right with forward step over the line with left 10x LE 50% use of arms for support WB on right with with left 4 inch step taps 2x10 Attempted right forward step up 4 inches but pt requires max assist to stabilize 2 inch forward step up with right 10x 50% use of arms for support RPE (Rating of Perceived Exertion):  6-7   /10 Seated blue band single leg clams 10x right/left Pt reports moderate dizziness in sitting, some improvement after a 1 minute  Therapist assisted pt to the car secondary to pt drove himself today, has bouts of dizziness and is using his cane: CGA to negotiate steps and curbs in the parking lot      6/10: 30 sec STS with hands 10x TUG 25.56 with cane  Able to rise 1x without UE assist Attempted 3 MWT with cane; then extreme dizziness requiring mod assist to stabilize; pt needed a chair to recover from dizziness; continued with 2 min walk with clinic RW (wheelchair follow close behind for safety) Recommend use of RW full time for safety Gait observations:  shuffled steps, right hip pelvic drop, increased toe out on right  Pt borrowed clinic RW to stand to schedule appts and safely get to the car post session   4/16 Therapeutic Exercise: Reviewed pt presentation, response to prior Rx, and pain level.  LAQ AROM 3x10 with 3# bilat Seated HS curls with GTB 3 x 10 bilat   sit to stands 2x10 from table with chair in front of him Pt ambulated with SPC 261 ft with cuing for posture and to increase R foot clearance.  Ambulating with SPC around cones with SBA                                                                                                     At end of Rx:  BP:  132/79  HR:   72   PATIENT EDUCATION:  Education details:  Exercise form, relevant anatomy, exercise rationale, vitals, gait, and POC.  Person educated: Patient Education method: Explanation, Demonstration, Tactile cues, Verbal cues Education comprehension:  verbalized understanding, returned demonstration, verbal cues required, tactile cues required, and needs further education    HOME EXERCISE PROGRAM: Access Code: [email or web address] URL: [email or web address] Date: 03/11/2024 Prepared by: Mayme Spearman  Program Notes - please use counter support for balance  Exercises - Semi-Tandem Balance at Counter Top Eyes Open  - 2-3 x daily - 1 sets - 1-2 reps - 20-30sec hold - Standing March with Counter Support  - 2-3 x daily - 1 sets - 8 reps  ASSESSMENT:  CLINICAL IMPRESSION: High risk for falls secondary to dizziness related to medication  to help his hip pain.  Now that his hip pain is well controlled he is able to perform standing ex's with focus on right LE strengthening.  Used the parallel bars for safety and therapist providing CGA constantly.  Weakness in right glute and quad muscles requiring moderate UE support.  He does need therapist assist with attempted 4 inch step up and down using his right leg.  Moderate perceived fatigue level end of session.      Re-eval visit: The patient returns after a gap in care since last visit.  He has been able to continue with aquatic ex 3x/week in the interim.  His hip pain is much better on Cymbalta  however this medication causes significant dizziness putting him at even higher risk of falls.  He arrives with a single point cane [facility]).  With attempted 3 min walk test, he becomes so dizzy he begins to stagger requiring mod assist to keep him standing and to bring a chair close by to sit down.  After a recovery period he is able to continue walking using the clinic rolling walker.  He has improved significantly with 30 sec sit to stand test and TUG test times. The patient would benefit from a continuation of skilled PT for a further progression of strengthening and functional mobility.  Will continue to update and promote independence in a HEP needed for a return to the highest functional level possible  with ADLs.       OBJECTIVE IMPAIRMENTS: Abnormal gait, decreased activity tolerance, decreased balance, decreased endurance, decreased mobility, difficulty walking, decreased ROM, decreased strength, improper body mechanics, postural dysfunction, and pain.   ACTIVITY LIMITATIONS: carrying, lifting, bending, standing, squatting, stairs, transfers, and locomotion level  PARTICIPATION LIMITATIONS: meal prep, cleaning, laundry, driving, and community activity  PERSONAL FACTORS: Age, Time since onset of injury/illness/exacerbation, and 3+ comorbidities: anemia, LE edema, essential tremor, GERD, basal cell carcinoma, hx DVT/PE, hx bone sarcoma, HTN, osteoporosis, CKD3, hx incontinence, CAD, CHF are also affecting patient's functional outcome.   REHAB POTENTIAL: Fair given chronicity and comorbidities  CLINICAL DECISION MAKING: Evolving/moderate complexity  EVALUATION COMPLEXITY: Moderate   GOALS:  SHORT TERM GOALS: Target date: 04/08/2024  Pt will demonstrate appropriate understanding and performance of initially prescribed HEP in order to facilitate improved independence with management of symptoms.  Baseline: HEP established  Goal status: INITIAL   2. Pt will be able to perform 4 sit to stands in >/= 30 sec in order to demonstrate improved activity tolerance and safety w/ transfers.  Baseline: 2 reps w/ UE support  Goal status: INITIAL  LONG TERM GOALS: Target date: 08/03/2024     The patient will have improved gait stamina and speed needed to ambulate 600 feet in 6 minutes with RW Goal status: revised  2.  The patient will have improved hip strength to at least 4+/5 needed for standing, walking longer distances and negotiating curbs  Goal status: revised  3. Pt will be able to perform TUG in less than or equal to 20 sec in order to indicate reduced risk of falling (cutoff score for fall risk 13.5 sec in community dwelling older adults per [facility] et al, 2000)  Baseline: 35sec  SPC initial, 25 sec on 6/10  Goal status: ongoing    4. Pt will be able to perform at least 8 repetitions during 30 second sit to stand test in order to demonstrate improved exercise/activity tolerance (cutoff score for low exercise tolerance 18 repetitions in males and 16 repetitions in  females per Jolie Neat al 2024)  Baseline: 2 repetitions, with UE support  Goal status: met 6/10   5. Pt will demonstrate appropriate performance of final prescribed HEP in order to facilitate improved self-management of symptoms post-discharge.   Baseline: initial HEP prescribed  Goal status: ongoing  6. The patient will demonstrate improved LE strength to at least 4+/5 needed to rise from a chair without using arm assistance >50% of the time NEW     PLAN:  PT FREQUENCY: 2x/week  PT DURATION: 8 weeks  PLANNED INTERVENTIONS: 97164- PT Re-evaluation, 97110-Therapeutic exercises, 97530- Therapeutic activity, 97112- Neuromuscular re-education, 97535- Self Care, 16109- Manual therapy, [id number]- Gait training, [id number]- Aquatic Therapy, Patient/Family education, Balance training, Stair training, Cryotherapy, and Moist heat.  PLAN FOR NEXT SESSION:  Right glute and quad strengthening; balance next to bar or parallel bars for safety secondary to high fall risk secondary to dizziness; work on clearing thresholds and step lengths; fall risk reduction. Symptom modification strategies as indicated/appropriate. Mindful of cardiac hx, DVT/PE hx, osteoporosis, and cancer hx; Color blindness   Darien Eden, PT 06/10/24 5:34 PM Phone: [phone] Fax: [phone]

## 2024-06-11 ENCOUNTER — Ambulatory Visit: Payer: Self-pay

## 2024-06-11 ENCOUNTER — Other Ambulatory Visit: Payer: Self-pay | Admitting: Family Medicine

## 2024-06-11 MED ORDER — FUROSEMIDE 20 MG PO TABS: 20.0000 mg | ORAL_TABLET | Freq: Every day | ORAL | 5 refills | Status: DC | PRN

## 2024-06-11 MED ORDER — FUROSEMIDE 20 MG PO TABS: 20.0000 mg | ORAL_TABLET | Freq: Every day | ORAL | 5 refills | Status: AC | PRN

## 2024-06-11 NOTE — Telephone Encounter (Signed)
 FYI Only or Action Required?: Action required by provider  Patient was last seen in primary care on 04/02/2024 by Rodney Clamp, MD. Called Nurse Triage reporting Leg Swelling. Symptoms began the day before yesterday. Interventions attempted: Nothing. Symptoms are: gradually worsening.  Triage Disposition: See Physician Within 24 Hours  Patient/caregiver understands and will follow disposition?: No, wishes to speak with PCP  Wife would like Lasix  prescription sent [facility], as it is not showing in their system.               Copied from CRM [id number]. Topic: Clinical - Red Word Triage >> Jun 11, 2024  3:12 PM Allyne Areola wrote: Red Word that prompted transfer to Nurse Triage: Patient legs and feet have swelled up for the last couple of days and noticed discoloration on both calfs. Reason for Disposition  [1] MODERATE leg swelling (e.g., swelling extends up to knees) AND [2] new-onset or worsening  Answer Assessment - Initial Assessment Questions 1. ONSET: When did the swelling start? (e.g., minutes, hours, days)     The day before yesterday  2. LOCATION: What part of the leg is swollen?  Are both legs swollen or just one leg?     BIL feet, back calves have purple coloring BIL.  3. SEVERITY: How bad is the swelling? (e.g., localized; mild, moderate, severe)   - Localized: Small area of swelling localized to one leg.   - MILD pedal edema: Swelling limited to foot and ankle, pitting edema < 1/4 inch (6 mm) deep, rest and elevation eliminate most or all swelling.   - MODERATE edema: Swelling of lower leg to knee, pitting edema > 1/4 inch (6 mm) deep, rest and elevation only partially reduce swelling.   - SEVERE edema: Swelling extends above knee, facial or hand swelling present.      Moderate  4. REDNESS: Does the swelling look red or infected?     No  5. PAIN: Is the swelling painful to touch? If Yes, ask: How painful is it?   (Scale 1-10; mild, moderate or  severe)     No  6. FEVER: Do you have a fever? If Yes, ask: What is it, how was it measured, and when did it start?      No  7. CAUSE: What do you think is causing the leg swelling?     Pt. Has CHF on Lasix   8. MEDICAL HISTORY: Do you have a history of blood clots (e.g., DVT), cancer, heart failure, kidney disease, or liver failure?     Blood clots in lungs a couple of years ago.  9. RECURRENT SYMPTOM: Have you had leg swelling before? If Yes, ask: When was the last time? What happened that time?     Yes 10. OTHER SYMPTOMS: Do you have any other symptoms? (e.g., chest pain, difficulty breathing)       No    Patient is on Lasix  for the swelling. The [facility] is not [facility]. Wife would like the order re-written and sent to the pharmacy, as the symptoms are returning and the Lasix  helps.  Protocols used: Leg Swelling and Edema-A-AH

## 2024-06-11 NOTE — Telephone Encounter (Signed)
 See below, ok to send in lasix ?

## 2024-06-11 NOTE — Telephone Encounter (Signed)
 I sent in to pharmacy x2- both listed

## 2024-06-14 ENCOUNTER — Encounter: Admitting: Physical Therapy

## 2024-06-22 ENCOUNTER — Ambulatory Visit: Admitting: Physical Therapy

## 2024-06-22 DIAGNOSIS — R262 Difficulty in walking, not elsewhere classified: Secondary | ICD-10-CM

## 2024-06-22 DIAGNOSIS — R2689 Other abnormalities of gait and mobility: Secondary | ICD-10-CM

## 2024-06-22 DIAGNOSIS — M6281 Muscle weakness (generalized): Secondary | ICD-10-CM

## 2024-06-22 DIAGNOSIS — R2681 Unsteadiness on feet: Secondary | ICD-10-CM | POA: Diagnosis not present

## 2024-06-22 NOTE — Therapy (Signed)
 OUTPATIENT PHYSICAL THERAPY TREATMENT   Patient Name: Bruce Ball MRN: [id number] DOB:1946/09/29, 78 y.o., male Today's Date: 06/22/2024  END OF SESSION:  PT End of Session - 06/22/24 1358     Visit Number 7    Number of Visits 17    Date for PT Re-Evaluation 08/03/24    Authorization Type BCBS medicare    Progress Note Due on Visit 10    PT Start Time 1400    PT Stop Time 1440    PT Time Calculation (min) 40 min    Activity Tolerance Patient tolerated treatment well            Past Medical History:  Diagnosis Date   Anemia associated with chronic renal failure    At high risk for falls    due to gait disorders;   recurrent falls   (05-12-2023  per pt last fall 2 weeks ago)   Chronic anticoagulation    eliquis --- managed by pcp   Coronary artery calcification seen on CAT scan    followed by dr t. turner---   05-31-2021  calcium  score= 152   Deafness in right ear    ED (erectile dysfunction)    Edema of both lower extremities    Essential tremor    Gait disorder    imbalance when walking uses cane/ walker;  recurrent falls   GERD (gastroesophageal reflux disease)    Hematuria    Hiatal hernia    History of adenomatous polyp of colon    History of agent Orange exposure    History of basal cell carcinoma (BCC) excision    History of DVT of lower extremity 12/2019   admission in epic ;   w/ PE's all lobes   History of esophageal dilatation 12/2019   dr leigh   History of kidney stones    History of peptic ulcer 2009   History of prostate cancer 12/2001   followed by dr alvaro;   first dx 01/ 2003,  Gleason 3+3,   03-10-2002  s/p  radical prostatectomy pelvic lymph node disections , no recurrence   History of pulmonary embolus (PE) 12/2019   admission in epic;   submassive all lobes and dvt -lle   History of sarcoma of bone 1970   orthopedic oncologist--- dr w. 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 shlomo;  monitor in epic 04-08-2023 low PVC burden and atrial ectopy   Renal calculi    bilateral   Stage 3 chronic kidney disease [facility])    nephrologist--- dr dolan   Thyroid  nodule 2016   endocrinologist--- dr charlane  [facility])  hx benign bx, right side   Urinary incontinence    total incontinence   Wears hearing aid in left ear    Past Surgical History:  Procedure Laterality Date   CYSTOSCOPY WITH RETROGRADE PYELOGRAM, URETEROSCOPY AND STENT PLACEMENT Right 11/06/2017   Procedure: CYSTOSCOPY WITH  STENT PLACEMENT RIGHT;  Surgeon: Renda Glance, MD;  Location: [facility];  Service: Urology;  Laterality: Right;   CYSTOSCOPY WITH RETROGRADE PYELOGRAM, URETEROSCOPY AND STENT PLACEMENT Left 06/09/2019   Procedure: CYSTOSCOPY WITH RETROGRADE PYELOGRAM, URETEROSCOPY AND STENT PLACEMENT X2;  Surgeon: Alvaro Hummer, MD;  Location: [facility];  Service: Urology;  Laterality: Left;   CYSTOSCOPY WITH RETROGRADE PYELOGRAM, URETEROSCOPY AND STENT PLACEMENT Bilateral 03/01/2020   Procedure: CYSTOSCOPY WITH RETROGRADE PYELOGRAM, URETEROSCOPY AND STENT PLACEMENT;  Surgeon: Alvaro Hummer, MD;  Location: [facility];  Service: Urology;  Laterality: Bilateral;  75 MINS   CYSTOSCOPY WITH RETROGRADE PYELOGRAM, URETEROSCOPY AND STENT PLACEMENT Bilateral 05/16/2023   Procedure: CYSTOSCOPY WITH RETROGRADE PYELOGRAM, URETEROSCOPY AND STENT PLACEMENT;  Surgeon: Alvaro Hummer KATHEE Mickey., MD;  Location: [facility];  Service: Urology;   Laterality: Bilateral;  90 MINS   CYSTOSCOPY/RETROGRADE/URETEROSCOPY  2020   at [facility] in [place]   CYSTOSCOPY/URETEROSCOPY/HOLMIUM LASER Right 11/24/2017   Procedure: CYSTOSCOPY/URETEROSCOPY/ RETROGRADE/STENT REMOVAL;  Surgeon: Renda Glance, MD;  Location: [facility];  Service: Urology;  Laterality: Right;   FEMORAL OSTEOTOMY W/ RODDING  1970   right proximal femUr resection replacement w/ custom prosthesis   FEMUR FRACTURE SURGERY Right 10/17/2021   @ [facility] by dr w. eward;  REVISION RIGHT PROXIMAL FEMUR REPLACEMENT IN SETTING OF DISTAL PERIPROSTHETIC FEMUR FRACTURE, BOTH COMPONENTS   FUNCTIONAL ENDOSCOPIC SINUS SURGERY  03/19/2023   @ [facility]   HOLMIUM LASER APPLICATION Bilateral 03/01/2020   Procedure: HOLMIUM LASER APPLICATION;  Surgeon: Alvaro Hummer, MD;  Location: [facility];  Service: Urology;  Laterality: Bilateral;   HOLMIUM LASER APPLICATION Bilateral 05/16/2023   Procedure: HOLMIUM LASER APPLICATION;  Surgeon: Alvaro Hummer KATHEE Mickey., MD;  Location: [facility];  Service: Urology;  Laterality: Bilateral;   LUMBAR SPINE SURGERY  07/2017   @ [facility]  by dr alix;    L5-- S1  laminectomy   POSTERIOR LUMBAR FUSION  06/28/2020   @ [facility] [place];   L5--S1   PROSTATECTOMY  03/10/2002   @[facility]  by dr mardy;    w/ pelvic lymph node dissection's   SPINAL CORD STIMULATOR REMOVAL  04/15/2016   W/   THORACIC PARTIAL LAMINECTOMY T9   Patient Active Problem List   Diagnosis Date Noted   Fall 01/05/2024   UTI due to Klebsiella species 11/11/2022   Basal cell carcinoma of scalp 09/16/2022   Deposits (accretions) on teeth 09/16/2022   Nontoxic single thyroid  nodule 09/16/2022   Thyroid  nodule 09/16/2022   Sensorineural hearing loss, bilateral 09/16/2022   Post-COVID syndrome 09/16/2022   Chronic pansinusitis 07/11/2022   Non-seasonal allergic rhinitis 06/11/2022   Abrasion of right arm 11/19/2021   Femur fracture, right (HCC) 10/17/2021    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HCC) 10/16/2021   History of pulmonary embolism 10/10/2021   Chronic diastolic CHF (congestive heart failure) (HCC) 10/10/2021   CKD (chronic kidney disease), stage III (HCC) 10/10/2021   Hyperlipidemia 10/10/2021   CAD (coronary artery disease) 04/03/2021   Chronic anticoagulation 03/26/2021   Acquired coagulation disorder (HCC) 12/12/2020   Senile purpura (HCC) 12/12/2020   Meibomian gland dysfunction of right eye, unspecified eyelid 07/11/2020   Vitamin B12 deficient megaloblastic anemia 07/11/2020   Aortic atherosclerosis (HCC) 05/17/2020   B12 deficiency 05/17/2020   Iliac artery aneurysm (HCC) 05/17/2020   Osteoporosis 04/04/2020   Idiopathic neuropathy 01/28/2020   Recurrent falls 01/28/2020   GERD (gastroesophageal reflux disease) 12/22/2019   Essential tremor 12/22/2019   Kidney stone 11/05/2017   History of total right hip  replacement 03/08/2017   History of prostate cancer 03/08/2017   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   Vertigo 03/01/2017   High risk medication use 02/28/2017   DDD (degenerative disc disease), thoracic 02/28/2017   Syncope 02/28/2017   Ataxia 05/18/2016   Vestibular disequilibrium 05/18/2016   Failed spinal cord stimulator (HCC) 03/20/2016   Lung nodule, solitary 05/24/2014   Peripheral edema 06/08/2012   Backache 04/21/2008   History of peptic ulcer disease 04/21/2008   Essential hypertension 10/29/2007    PCP: Katrinka Garnette KIDD, MD  REFERRING PROVIDER: Katrinka Garnette KIDD, MD  REFERRING DIAG: R26.89 (ICD-10-CM) - Balance disorder R53.1 (ICD-10-CM) - Generalized weakness  Rationale for Evaluation and Treatment: Rehabilitation  THERAPY DIAG:  Muscle weakness (generalized)  Other abnormalities of gait and mobility  Difficulty in walking, not elsewhere classified  ONSET DATE: chronic - slowly worsening over time   SUBJECTIVE:                                                                                                                                                                                            SUBJECTIVE STATEMENT: My wife brought met (ortho side instead of cancer side).  Using SPC. Mild hip pain today.  Did OK after last time.  Dizzy like always.  Fatigued after last time but that's the medication.      Dizziness is there all the time.  No hip pain.  Presents with SPC.    Goes by Zachary although his wife calls him Lytle  Had PT with Harlene and Mose at [facility]  PERTINENT HISTORY:  anemia, LE edema, essential tremor, GERD, basal cell carcinoma, hx DVT/PE, hx bone sarcoma, HTN, osteoporosis, CKD3, hx incontinence, CAD, CHF Peripheral neuropathy Right ear hearing impairment  R Hip surgery/proximal femur replacement many years ago with THA revision in the setting of a periprosthetic distal femur fx 10/17/2021  L5-S1 fusion 2021  PAIN:  2/10 anterior hip pain    PRECAUTIONS: fall risk, osteoporosis, cardiac hx  WEIGHT BEARING RESTRICTIONS: No  FALLS:  Has patient fallen in last 6 months? Yes. Number of falls ~5 falls ; pt reports most recent in January, does not recall episode  LIVING ENVIRONMENT: No STE, multilevel home main level livable Lives w/ wife who does majority of housework per pt Uses cane vs rollator (usually the latter)  OCCUPATION: retired  - was an Airline pilot  PLOF: mod I for basic mobility  PATIENT GOALS: improve balance and strength  NEXT MD VISIT: TBD  OBJECTIVE:  Note: Objective measures were completed at Evaluation unless otherwise noted.  DIAGNOSTIC FINDINGS:  01/05/24 CTH, CT abd/pelvis, CT chest PE, CT cervical  spine ; refer to EPIC for details   06/08/24: 30 sec STS with hands 10x TUG 25.56 with cane      TREATMENT DATE:  06/22/24: Nu-Step seat 10, arms 11 L 3  10 min while discussing status (65 spm goal)  Sit to stand+ foam cushion with staggered stance 4x discontinued secondary to complaint of right hip pain and left knee pain Next  to railing:  2 inch lateral step ups 10x (bil UE support) cues to come all the way up, tends to go too fast  2 inch forward step ups 10x cues to come all the way up, tends to go too fast Standing 5# reach down (RDL) toward opposite knee 2 sets of 5 right/left Standing marching 3 sets of 5 holding 5# weight single side:  farmer's hold, at the shoulder, overhead right/left (very challenged with holding weight on the left, works the right hip) Seated 5# resting on knee: hip flexion/abduction over cane on the floor 20x right/left (challenging on right side) Standing purple medium power cord at the ASIS (other end held by therapist) pelvic press forward 10x 3 sec hold Standing purple medium power cord around upper back (other end held by therapist) trunk extension 10x 3 sec hold Seated hip abduction with foot on floor slider blue band resisted 10x right/left RPE 1/10 easy Leg press seat 9: bil press 70# 10x, increased to 80# 10x (recommend seated position to maneuver right LE on/off equipment)    6/12: Nu-Step seat 11, arms 11 L 3   10 min while discussing status (60 spm) set a goal next time for spm challenge (pt states he likes that) He is regularly doing Sit to stand repetitions at home Sit to stand (unable to rise from regular seat height without arm assist) added foam cushion and able to do without arm assist 10x  Inside // bars: Circle taps rainbow pattern with WB on right, touches with left 10x LE 50% use of arms for support (pt is colorblind) WB on right with forward step over the line with left 10x LE 50% use of arms for support WB on right with with left 4 inch step taps 2x10 Attempted right forward step up 4 inches but pt requires max assist to stabilize 2 inch forward step up with right 10x 50% use of arms for support RPE (Rating of Perceived Exertion):  6-7   /10 Seated blue band single leg clams 10x right/left Pt reports moderate dizziness in sitting, some improvement after a 1  minute  Therapist assisted pt to the car secondary to pt drove himself today, has bouts of dizziness and is using his cane: CGA to negotiate steps and curbs in the parking lot      6/10: 30 sec STS with hands 10x TUG 25.56 with cane  Able to rise 1x without UE assist Attempted 3 MWT with cane; then extreme dizziness requiring mod assist to stabilize; pt needed a chair to recover from dizziness; continued with 2 min walk with clinic RW (wheelchair follow close behind for safety) Recommend use of RW full time for safety Gait observations:  shuffled steps, right hip pelvic drop, increased toe out on right  Pt borrowed clinic RW to stand to schedule appts and safely get to the car post session   4/16 Therapeutic Exercise: Reviewed pt presentation, response to prior Rx, and pain level.  LAQ AROM 3x10 with 3# bilat Seated HS curls with GTB 3 x 10 bilat   sit to stands 2x10  from table with chair in front of him Pt ambulated with SPC 261 ft with cuing for posture and to increase R foot clearance.  Ambulating with SPC around cones with SBA                                                                                                     At end of Rx:  BP:  132/79  HR:   72   PATIENT EDUCATION:  Education details:  Exercise form, relevant anatomy, exercise rationale, vitals, gait, and POC.  Person educated: Patient Education method: Explanation, Demonstration, Tactile cues, Verbal cues Education comprehension: verbalized understanding, returned demonstration, verbal cues required, tactile cues required, and needs further education    HOME EXERCISE PROGRAM: Access Code: [email or web address] URL: [email or web address] Date: 03/11/2024 Prepared by: Alm Jenny  Program Notes - please use counter support for balance  Exercises - Semi-Tandem Balance at Counter Top Eyes Open  - 2-3 x daily - 1 sets - 1-2 reps - 20-30sec hold - Standing March with Counter Support  - 2-3 x daily  - 1 sets - 8 reps  ASSESSMENT:  CLINICAL IMPRESSION: Hip pain is well controlled by Cymbalta  although he does have the persistent side effect of dizziness making him at high risk of falls.  Exercises performed at the railing for safety.  Weakness in right hip musculature secondary to prior surgeries.  Right LE in externally rotated position which patient states is a consequence of surgical fixation.  Added leg press today (bilateral) with low load to assess response (intentionally underdosed).  His rating of perceived exertion remains low throughout session.  Therapist providing verbal cues to optimize technique with  exercises in order to achieve the greatest benefit.       Re-eval visit: The patient returns after a gap in care since last visit.  He has been able to continue with aquatic ex 3x/week in the interim.  His hip pain is much better on Cymbalta  however this medication causes significant dizziness putting him at even higher risk of falls.  He arrives with a single point cane [facility]).  With attempted 3 min walk test, he becomes so dizzy he begins to stagger requiring mod assist to keep him standing and to bring a chair close by to sit down.  After a recovery period he is able to continue walking using the clinic rolling walker.  He has improved significantly with 30 sec sit to stand test and TUG test times. The patient would benefit from a continuation of skilled PT for a further progression of strengthening and functional mobility.  Will continue to update and promote independence in a HEP needed for a return to the highest functional level possible with ADLs.       OBJECTIVE IMPAIRMENTS: Abnormal gait, decreased activity tolerance, decreased balance, decreased endurance, decreased mobility, difficulty walking, decreased ROM, decreased strength, improper body mechanics, postural dysfunction, and pain.   ACTIVITY LIMITATIONS: carrying, lifting, bending, standing, squatting, stairs,  transfers, and locomotion level  PARTICIPATION LIMITATIONS: meal prep, cleaning, laundry, driving, and community activity  PERSONAL FACTORS: Age, Time since  onset of injury/illness/exacerbation, and 3+ comorbidities: anemia, LE edema, essential tremor, GERD, basal cell carcinoma, hx DVT/PE, hx bone sarcoma, HTN, osteoporosis, CKD3, hx incontinence, CAD, CHF are also affecting patient's functional outcome.   REHAB POTENTIAL: Fair given chronicity and comorbidities  CLINICAL DECISION MAKING: Evolving/moderate complexity  EVALUATION COMPLEXITY: Moderate   GOALS:  SHORT TERM GOALS: Target date: 04/08/2024  Pt will demonstrate appropriate understanding and performance of initially prescribed HEP in order to facilitate improved independence with management of symptoms.  Baseline: HEP established  Goal status: INITIAL   2. Pt will be able to perform 4 sit to stands in >/= 30 sec in order to demonstrate improved activity tolerance and safety w/ transfers.  Baseline: 2 reps w/ UE support  Goal status: INITIAL  LONG TERM GOALS: Target date: 08/03/2024     The patient will have improved gait stamina and speed needed to ambulate 600 feet in 6 minutes with RW Goal status: revised  2.  The patient will have improved hip strength to at least 4+/5 needed for standing, walking longer distances and negotiating curbs  Goal status: revised  3. Pt will be able to perform TUG in less than or equal to 20 sec in order to indicate reduced risk of falling (cutoff score for fall risk 13.5 sec in community dwelling older adults per [facility] et al, 2000)  Baseline: 35sec SPC initial, 25 sec on 6/10  Goal status: ongoing    4. Pt will be able to perform at least 8 repetitions during 30 second sit to stand test in order to demonstrate improved exercise/activity tolerance (cutoff score for low exercise tolerance 18 repetitions in males and 16 repetitions in females per Delford dunker al 2024)  Baseline: 2  repetitions, with UE support  Goal status: met 6/10   5. Pt will demonstrate appropriate performance of final prescribed HEP in order to facilitate improved self-management of symptoms post-discharge.   Baseline: initial HEP prescribed  Goal status: ongoing  6. The patient will demonstrate improved LE strength to at least 4+/5 needed to rise from a chair without using arm assistance >50% of the time NEW     PLAN:  PT FREQUENCY: 2x/week  PT DURATION: 8 weeks  PLANNED INTERVENTIONS: [id number]- PT Re-evaluation, 97110-Therapeutic exercises, 97530- Therapeutic activity, 97112- Neuromuscular re-education, 97535- Self Care, [id number]- Manual therapy, [id number]- Gait training, [phone]- Aquatic Therapy, Patient/Family education, Balance training, Stair training, Cryotherapy, and Moist heat.  PLAN FOR NEXT SESSION:  Right glute and quad strengthening; balance next to bar or parallel bars for safety secondary to high fall risk secondary to dizziness;try single leg press;  work on clearing thresholds and step lengths; fall risk reduction. Symptom modification strategies as indicated/appropriate. Mindful of cardiac hx, DVT/PE hx, osteoporosis, and cancer hx; Color blindness; hearing impairment (look at patient when speaking)  Glade Pesa, PT 06/22/24 6:22 PM Phone: [phone] Fax: [phone]

## 2024-06-27 DIAGNOSIS — I10 Essential (primary) hypertension: Secondary | ICD-10-CM | POA: Diagnosis not present

## 2024-06-27 DIAGNOSIS — S0990XA Unspecified injury of head, initial encounter: Secondary | ICD-10-CM | POA: Diagnosis not present

## 2024-06-27 DIAGNOSIS — R519 Headache, unspecified: Secondary | ICD-10-CM | POA: Diagnosis not present

## 2024-06-27 DIAGNOSIS — T07XXXA Unspecified multiple injuries, initial encounter: Secondary | ICD-10-CM | POA: Diagnosis not present

## 2024-06-29 ENCOUNTER — Ambulatory Visit: Admitting: Physical Therapy

## 2024-07-01 ENCOUNTER — Ambulatory Visit: Admitting: Physical Therapy

## 2024-07-06 ENCOUNTER — Encounter: Admitting: Physical Therapy

## 2024-07-13 ENCOUNTER — Encounter: Admitting: Physical Therapy

## 2024-07-13 DIAGNOSIS — I509 Heart failure, unspecified: Secondary | ICD-10-CM | POA: Diagnosis not present

## 2024-07-13 DIAGNOSIS — S72431D Displaced fracture of medial condyle of right femur, subsequent encounter for closed fracture with routine healing: Secondary | ICD-10-CM | POA: Diagnosis not present

## 2024-07-13 DIAGNOSIS — R2241 Localized swelling, mass and lump, right lower limb: Secondary | ICD-10-CM | POA: Diagnosis not present

## 2024-07-13 DIAGNOSIS — E785 Hyperlipidemia, unspecified: Secondary | ICD-10-CM | POA: Diagnosis not present

## 2024-07-13 DIAGNOSIS — N189 Chronic kidney disease, unspecified: Secondary | ICD-10-CM | POA: Diagnosis not present

## 2024-07-13 DIAGNOSIS — S7291XA Unspecified fracture of right femur, initial encounter for closed fracture: Secondary | ICD-10-CM | POA: Diagnosis not present

## 2024-07-13 DIAGNOSIS — S72461D Displaced supracondylar fracture with intracondylar extension of lower end of right femur, subsequent encounter for closed fracture with routine healing: Secondary | ICD-10-CM | POA: Diagnosis not present

## 2024-07-13 DIAGNOSIS — W1839XD Other fall on same level, subsequent encounter: Secondary | ICD-10-CM | POA: Diagnosis not present

## 2024-07-13 DIAGNOSIS — X58XXXA Exposure to other specified factors, initial encounter: Secondary | ICD-10-CM | POA: Diagnosis not present

## 2024-07-13 DIAGNOSIS — M6281 Muscle weakness (generalized): Secondary | ICD-10-CM | POA: Diagnosis not present

## 2024-07-13 DIAGNOSIS — M978XXA Periprosthetic fracture around other internal prosthetic joint, initial encounter: Secondary | ICD-10-CM | POA: Diagnosis not present

## 2024-07-13 DIAGNOSIS — I10 Essential (primary) hypertension: Secondary | ICD-10-CM | POA: Diagnosis not present

## 2024-07-13 DIAGNOSIS — I82401 Acute embolism and thrombosis of unspecified deep veins of right lower extremity: Secondary | ICD-10-CM | POA: Diagnosis not present

## 2024-07-13 DIAGNOSIS — F32A Depression, unspecified: Secondary | ICD-10-CM | POA: Diagnosis not present

## 2024-07-13 DIAGNOSIS — M25461 Effusion, right knee: Secondary | ICD-10-CM | POA: Diagnosis not present

## 2024-07-13 DIAGNOSIS — C419 Malignant neoplasm of bone and articular cartilage, unspecified: Secondary | ICD-10-CM | POA: Diagnosis not present

## 2024-07-13 DIAGNOSIS — R278 Other lack of coordination: Secondary | ICD-10-CM | POA: Diagnosis not present

## 2024-07-13 DIAGNOSIS — K219 Gastro-esophageal reflux disease without esophagitis: Secondary | ICD-10-CM | POA: Diagnosis not present

## 2024-07-13 DIAGNOSIS — Z9889 Other specified postprocedural states: Secondary | ICD-10-CM | POA: Diagnosis not present

## 2024-07-13 DIAGNOSIS — Z96649 Presence of unspecified artificial hip joint: Secondary | ICD-10-CM | POA: Diagnosis not present

## 2024-07-13 DIAGNOSIS — R2689 Other abnormalities of gait and mobility: Secondary | ICD-10-CM | POA: Diagnosis not present

## 2024-07-13 DIAGNOSIS — M978XXD Periprosthetic fracture around other internal prosthetic joint, subsequent encounter: Secondary | ICD-10-CM | POA: Diagnosis not present

## 2024-07-14 ENCOUNTER — Encounter: Payer: Self-pay | Admitting: Family Medicine

## 2024-07-14 DIAGNOSIS — R160 Hepatomegaly, not elsewhere classified: Secondary | ICD-10-CM

## 2024-07-14 DIAGNOSIS — K769 Liver disease, unspecified: Secondary | ICD-10-CM

## 2024-07-14 NOTE — Telephone Encounter (Signed)
 See below

## 2024-07-15 ENCOUNTER — Encounter: Admitting: Physical Therapy

## 2024-07-19 DIAGNOSIS — M978XXA Periprosthetic fracture around other internal prosthetic joint, initial encounter: Secondary | ICD-10-CM | POA: Diagnosis not present

## 2024-07-19 DIAGNOSIS — C419 Malignant neoplasm of bone and articular cartilage, unspecified: Secondary | ICD-10-CM | POA: Diagnosis not present

## 2024-07-19 DIAGNOSIS — Z96649 Presence of unspecified artificial hip joint: Secondary | ICD-10-CM | POA: Diagnosis not present

## 2024-07-19 DIAGNOSIS — M25461 Effusion, right knee: Secondary | ICD-10-CM | POA: Diagnosis not present

## 2024-07-20 ENCOUNTER — Encounter: Admitting: Physical Therapy

## 2024-07-20 ENCOUNTER — Ambulatory Visit: Admitting: Neurology

## 2024-07-22 ENCOUNTER — Encounter: Admitting: Physical Therapy

## 2024-07-27 ENCOUNTER — Encounter: Admitting: Physical Therapy

## 2024-07-29 ENCOUNTER — Encounter: Admitting: Physical Therapy

## 2024-08-03 ENCOUNTER — Encounter: Admitting: Physical Therapy

## 2024-08-05 ENCOUNTER — Encounter: Admitting: Physical Therapy

## 2024-08-06 DIAGNOSIS — R2241 Localized swelling, mass and lump, right lower limb: Secondary | ICD-10-CM | POA: Diagnosis not present

## 2024-08-17 DIAGNOSIS — S7291XA Unspecified fracture of right femur, initial encounter for closed fracture: Secondary | ICD-10-CM | POA: Diagnosis not present

## 2024-08-17 DIAGNOSIS — M978XXA Periprosthetic fracture around other internal prosthetic joint, initial encounter: Secondary | ICD-10-CM | POA: Diagnosis not present

## 2024-08-17 DIAGNOSIS — Z9889 Other specified postprocedural states: Secondary | ICD-10-CM | POA: Diagnosis not present

## 2024-08-17 DIAGNOSIS — X58XXXA Exposure to other specified factors, initial encounter: Secondary | ICD-10-CM | POA: Diagnosis not present

## 2024-08-17 DIAGNOSIS — S72461D Displaced supracondylar fracture with intracondylar extension of lower end of right femur, subsequent encounter for closed fracture with routine healing: Secondary | ICD-10-CM | POA: Diagnosis not present

## 2024-08-17 DIAGNOSIS — M978XXD Periprosthetic fracture around other internal prosthetic joint, subsequent encounter: Secondary | ICD-10-CM | POA: Diagnosis not present

## 2024-08-17 DIAGNOSIS — C419 Malignant neoplasm of bone and articular cartilage, unspecified: Secondary | ICD-10-CM | POA: Diagnosis not present

## 2024-08-17 DIAGNOSIS — Z96649 Presence of unspecified artificial hip joint: Secondary | ICD-10-CM | POA: Diagnosis not present

## 2024-08-17 DIAGNOSIS — W1839XD Other fall on same level, subsequent encounter: Secondary | ICD-10-CM | POA: Diagnosis not present

## 2024-08-21 DIAGNOSIS — Z556 Problems related to health literacy: Secondary | ICD-10-CM | POA: Diagnosis not present

## 2024-08-21 DIAGNOSIS — Z86718 Personal history of other venous thrombosis and embolism: Secondary | ICD-10-CM | POA: Diagnosis not present

## 2024-08-21 DIAGNOSIS — Z7901 Long term (current) use of anticoagulants: Secondary | ICD-10-CM | POA: Diagnosis not present

## 2024-08-21 DIAGNOSIS — I509 Heart failure, unspecified: Secondary | ICD-10-CM | POA: Diagnosis not present

## 2024-08-21 DIAGNOSIS — I110 Hypertensive heart disease with heart failure: Secondary | ICD-10-CM | POA: Diagnosis not present

## 2024-08-21 DIAGNOSIS — Z9181 History of falling: Secondary | ICD-10-CM | POA: Diagnosis not present

## 2024-08-21 DIAGNOSIS — N289 Disorder of kidney and ureter, unspecified: Secondary | ICD-10-CM | POA: Diagnosis not present

## 2024-08-21 DIAGNOSIS — K219 Gastro-esophageal reflux disease without esophagitis: Secondary | ICD-10-CM | POA: Diagnosis not present

## 2024-08-21 DIAGNOSIS — Z604 Social exclusion and rejection: Secondary | ICD-10-CM | POA: Diagnosis not present

## 2024-08-21 DIAGNOSIS — S72411D Displaced unspecified condyle fracture of lower end of right femur, subsequent encounter for closed fracture with routine healing: Secondary | ICD-10-CM | POA: Diagnosis not present

## 2024-08-23 ENCOUNTER — Telehealth: Payer: Self-pay

## 2024-08-23 NOTE — Telephone Encounter (Signed)
 Noted thanks

## 2024-08-23 NOTE — Transitions of Care (Post Inpatient/ED Visit) (Signed)
 08/23/2024  Name: Bruce Ball MRN: [id number] DOB: [birth year]  Today's TOC FU Call Status: Today's TOC FU Call Status:: Successful TOC FU Call Completed TOC FU Call Complete Date: 08/23/24 Patient's Name and Date of Birth confirmed.  Transition Care Management Follow-up Telephone Call Date of Discharge: 08/21/24 Discharge Facility: Other ([facility]) Name of Other ([facility]) Discharge Facility: [facility] Type of Discharge: Inpatient Admission Primary Inpatient Discharge Diagnosis:: abd gait How have you been since you were released from the hospital?: Better Any questions or concerns?: No  Items Reviewed: Did you receive and understand the discharge instructions provided?: Yes Medications obtained,verified, and reconciled?: Yes (Medications Reviewed) Any new allergies since your discharge?: No Dietary orders reviewed?: Yes Do you have support at home?: Yes People in Home [RPT]: spouse  Medications Reviewed Today: Medications Reviewed Today     Reviewed by Emmitt Pan, LPN (Licensed Practical Nurse) on 08/23/24 at 1107  Med List Status: <None>   Medication Order Taking? Sig Documenting Provider Last Dose Status Informant  acetaminophen  (TYLENOL ) 500 MG tablet [id number] Yes Take 2 tablets (1,000 mg total) by mouth every 6 (six) hours as needed for headache, fever, moderate pain (pain score 4-6) or mild pain (pain score 1-3). Mesner, Selinda, MD  Active Spouse/Significant Other  apixaban  (ELIQUIS ) 2.5 MG TABS tablet [id number] Yes Take 1 tablet (2.5 mg total) by mouth 2 (two) times daily. Lonn Hicks, MD  Active Spouse/Significant Other  atorvastatin  (LIPITOR) 20 MG tablet [id number] Yes Take 1 tablet (20 mg total) by mouth at bedtime.  Patient taking differently: Take 20 mg by mouth at bedtime.   Shlomo Wilbert SAUNDERS, MD  Active Spouse/Significant Other  azithromycin  (ZITHROMAX ) 250 MG tablet [id number]  Take 2 tabs day 1, then 1 tab daily  Patient not taking: Reported on  08/23/2024   Kennyth Worth HERO, MD  Active   Budesonide 2 MG/10ML SUSP [id number] Yes Take by mouth. 1/2 twice a day [provider]  Active   carvedilol  (COREG ) 25 MG tablet [id number] Yes Take 25 mg by mouth 2 (two) times daily with a meal. [provider]  Active Spouse/Significant Other  cetirizine (ZYRTEC) 10 MG tablet [id number] Yes Take 10 mg by mouth as needed for allergies. [provider]  Active Spouse/Significant Other  Cholecalciferol  (VITAMIN D3) 50 MCG (2000 UT) capsule [id number] Yes Take 2,000 Units by mouth in the morning and at bedtime.  [provider]  Active Spouse/Significant Other  DULoxetine  (CYMBALTA ) 30 MG capsule [id number] Yes Take 30 mg by mouth daily. [provider]  Active   fluticasone  (FLONASE ) 50 MCG/ACT nasal spray [id number] Yes Place 1-2 sprays into both nostrils daily as needed for allergies or rhinitis. [provider]  Active Spouse/Significant Other  furosemide  (LASIX ) 20 MG tablet [id number] Yes Take 1 tablet (20 mg total) by mouth daily as needed for fluid (if the feet and/or legs swell or weight increased 3 lbs in a day or 5 lbs in 3 days). Katrinka Garnette KIDD, MD  Active   HYDROmorphone  (DILAUDID ) 4 MG tablet [id number] Yes Take 0.5 tablets (2 mg total) by mouth every 12 (twelve) hours as needed for severe pain (pain score 7-10). Mesner, Selinda, MD  Active   methocarbamol  (ROBAXIN ) 500 MG tablet [id number] Yes Take 1 tablet (500 mg total) by mouth every 8 (eight) hours as needed for muscle spasms. Mesner, Selinda, MD  Active   metoCLOPramide  (REGLAN ) 10 MG tablet [id number] Yes Take by mouth. [provider]  Active   Multiple  Vitamin (MULTIVITAMIN) capsule [id number] Yes Take 1 capsule by mouth daily. [provider]  Active Spouse/Significant Other  pantoprazole  (PROTONIX ) 40 MG tablet [id number] Yes Take 40 mg by mouth 2 (two) times daily before a meal. [provider]  Active  Spouse/Significant Other  potassium citrate  (UROCIT-K ) 10 MEQ (1080 MG) SR tablet [id number] Yes Take 1 tablet (10 mEq total) by mouth as directed. Jodie Lavern CROME, MD  Active Spouse/Significant Other  predniSONE  (DELTASONE ) 50 MG tablet [id number]  Take 1 tablet daily for 5 days.  Patient not taking: Reported on 08/23/2024   Kennyth Worth HERO, MD  Active   Med List Note Junior Tawni RAMAN 10/15/21 [id number]): [facility] and Equipment/Supplies: Were Home Health Services Ordered?: Yes Name of Home Health Agency:: unknown Has Agency set up a time to come to your home?: No Any new equipment or medical supplies ordered?: Yes Name of Medical supply agency?: unknown Were you able to get the equipment/medical supplies?: Yes Do you have any questions related to the use of the equipment/supplies?: No  Functional Questionnaire: Do you need assistance with bathing/showering or dressing?: No Do you need assistance with meal preparation?: No Do you need assistance with eating?: No Do you have difficulty maintaining continence: No Do you need assistance with getting out of bed/getting out of a chair/moving?: No Do you have difficulty managing or taking your medications?: No  Follow up appointments reviewed: PCP Follow-up appointment confirmed?: Yes Date of PCP follow-up appointment?: 09/02/24 Follow-up Provider: [facility] Follow-up appointment confirmed?: NA Do you need transportation to your follow-up appointment?: No Do you understand care options if your condition(s) worsen?: Yes-patient verbalized understanding    SIGNATURE Julian Lemmings, LPN [facility] Nurse Health Advisor Direct Dial [phone]

## 2024-08-24 ENCOUNTER — Encounter: Payer: Self-pay | Admitting: Family Medicine

## 2024-08-24 NOTE — Telephone Encounter (Signed)
 Noted, patient scheduled for 09/02/2024.

## 2024-08-25 DIAGNOSIS — I110 Hypertensive heart disease with heart failure: Secondary | ICD-10-CM | POA: Diagnosis not present

## 2024-08-25 DIAGNOSIS — Z7901 Long term (current) use of anticoagulants: Secondary | ICD-10-CM | POA: Diagnosis not present

## 2024-08-25 DIAGNOSIS — K219 Gastro-esophageal reflux disease without esophagitis: Secondary | ICD-10-CM | POA: Diagnosis not present

## 2024-08-25 DIAGNOSIS — Z86718 Personal history of other venous thrombosis and embolism: Secondary | ICD-10-CM | POA: Diagnosis not present

## 2024-08-25 DIAGNOSIS — I509 Heart failure, unspecified: Secondary | ICD-10-CM | POA: Diagnosis not present

## 2024-08-25 DIAGNOSIS — N289 Disorder of kidney and ureter, unspecified: Secondary | ICD-10-CM | POA: Diagnosis not present

## 2024-08-25 DIAGNOSIS — Z9181 History of falling: Secondary | ICD-10-CM | POA: Diagnosis not present

## 2024-08-25 DIAGNOSIS — S72411D Displaced unspecified condyle fracture of lower end of right femur, subsequent encounter for closed fracture with routine healing: Secondary | ICD-10-CM | POA: Diagnosis not present

## 2024-08-25 DIAGNOSIS — Z556 Problems related to health literacy: Secondary | ICD-10-CM | POA: Diagnosis not present

## 2024-08-25 DIAGNOSIS — Z604 Social exclusion and rejection: Secondary | ICD-10-CM | POA: Diagnosis not present

## 2024-08-27 DIAGNOSIS — Z9181 History of falling: Secondary | ICD-10-CM | POA: Diagnosis not present

## 2024-08-27 DIAGNOSIS — I509 Heart failure, unspecified: Secondary | ICD-10-CM | POA: Diagnosis not present

## 2024-08-27 DIAGNOSIS — Z86718 Personal history of other venous thrombosis and embolism: Secondary | ICD-10-CM | POA: Diagnosis not present

## 2024-08-27 DIAGNOSIS — Z604 Social exclusion and rejection: Secondary | ICD-10-CM | POA: Diagnosis not present

## 2024-08-27 DIAGNOSIS — Z556 Problems related to health literacy: Secondary | ICD-10-CM | POA: Diagnosis not present

## 2024-08-27 DIAGNOSIS — N289 Disorder of kidney and ureter, unspecified: Secondary | ICD-10-CM | POA: Diagnosis not present

## 2024-08-27 DIAGNOSIS — I110 Hypertensive heart disease with heart failure: Secondary | ICD-10-CM | POA: Diagnosis not present

## 2024-08-27 DIAGNOSIS — S72411D Displaced unspecified condyle fracture of lower end of right femur, subsequent encounter for closed fracture with routine healing: Secondary | ICD-10-CM | POA: Diagnosis not present

## 2024-08-27 DIAGNOSIS — K219 Gastro-esophageal reflux disease without esophagitis: Secondary | ICD-10-CM | POA: Diagnosis not present

## 2024-08-27 DIAGNOSIS — Z7901 Long term (current) use of anticoagulants: Secondary | ICD-10-CM | POA: Diagnosis not present

## 2024-08-31 DIAGNOSIS — K219 Gastro-esophageal reflux disease without esophagitis: Secondary | ICD-10-CM | POA: Diagnosis not present

## 2024-08-31 DIAGNOSIS — N289 Disorder of kidney and ureter, unspecified: Secondary | ICD-10-CM | POA: Diagnosis not present

## 2024-08-31 DIAGNOSIS — Z86718 Personal history of other venous thrombosis and embolism: Secondary | ICD-10-CM | POA: Diagnosis not present

## 2024-08-31 DIAGNOSIS — I110 Hypertensive heart disease with heart failure: Secondary | ICD-10-CM | POA: Diagnosis not present

## 2024-08-31 DIAGNOSIS — S72411D Displaced unspecified condyle fracture of lower end of right femur, subsequent encounter for closed fracture with routine healing: Secondary | ICD-10-CM | POA: Diagnosis not present

## 2024-08-31 DIAGNOSIS — Z7901 Long term (current) use of anticoagulants: Secondary | ICD-10-CM | POA: Diagnosis not present

## 2024-08-31 DIAGNOSIS — Z556 Problems related to health literacy: Secondary | ICD-10-CM | POA: Diagnosis not present

## 2024-08-31 DIAGNOSIS — I509 Heart failure, unspecified: Secondary | ICD-10-CM | POA: Diagnosis not present

## 2024-08-31 DIAGNOSIS — Z9181 History of falling: Secondary | ICD-10-CM | POA: Diagnosis not present

## 2024-08-31 DIAGNOSIS — Z604 Social exclusion and rejection: Secondary | ICD-10-CM | POA: Diagnosis not present

## 2024-09-02 ENCOUNTER — Ambulatory Visit (INDEPENDENT_AMBULATORY_CARE_PROVIDER_SITE_OTHER): Admitting: Family Medicine

## 2024-09-02 ENCOUNTER — Encounter: Payer: Self-pay | Admitting: Family Medicine

## 2024-09-02 VITALS — BP 134/76 | HR 67 | Temp 98.4°F | Ht 71.0 in

## 2024-09-02 DIAGNOSIS — S728X1A Other fracture of right femur, initial encounter for closed fracture: Secondary | ICD-10-CM

## 2024-09-02 DIAGNOSIS — Z7901 Long term (current) use of anticoagulants: Secondary | ICD-10-CM | POA: Diagnosis not present

## 2024-09-02 DIAGNOSIS — I509 Heart failure, unspecified: Secondary | ICD-10-CM | POA: Diagnosis not present

## 2024-09-02 DIAGNOSIS — N289 Disorder of kidney and ureter, unspecified: Secondary | ICD-10-CM | POA: Diagnosis not present

## 2024-09-02 DIAGNOSIS — R2689 Other abnormalities of gait and mobility: Secondary | ICD-10-CM

## 2024-09-02 DIAGNOSIS — I10 Essential (primary) hypertension: Secondary | ICD-10-CM

## 2024-09-02 DIAGNOSIS — R296 Repeated falls: Secondary | ICD-10-CM

## 2024-09-02 DIAGNOSIS — Z604 Social exclusion and rejection: Secondary | ICD-10-CM | POA: Diagnosis not present

## 2024-09-02 DIAGNOSIS — Z9181 History of falling: Secondary | ICD-10-CM | POA: Diagnosis not present

## 2024-09-02 DIAGNOSIS — Z86718 Personal history of other venous thrombosis and embolism: Secondary | ICD-10-CM | POA: Diagnosis not present

## 2024-09-02 DIAGNOSIS — S72411D Displaced unspecified condyle fracture of lower end of right femur, subsequent encounter for closed fracture with routine healing: Secondary | ICD-10-CM | POA: Diagnosis not present

## 2024-09-02 DIAGNOSIS — I110 Hypertensive heart disease with heart failure: Secondary | ICD-10-CM | POA: Diagnosis not present

## 2024-09-02 DIAGNOSIS — Z556 Problems related to health literacy: Secondary | ICD-10-CM | POA: Diagnosis not present

## 2024-09-02 DIAGNOSIS — K219 Gastro-esophageal reflux disease without esophagitis: Secondary | ICD-10-CM | POA: Diagnosis not present

## 2024-09-02 MED ORDER — DULOXETINE HCL 30 MG PO CPEP: 30.0000 mg | ORAL_CAPSULE | Freq: Every day | ORAL | 3 refills | Status: DC

## 2024-09-02 NOTE — Progress Notes (Signed)
 Phone [phone] In person visit   Subjective:   Bruce Ball is a 78 y.o. year old very pleasant male patient who presents for/with See problem oriented charting Chief Complaint  Patient presents with   Hospitalization Follow-up    Right knee and hip pain; dizziness      Past Medical History-  Patient Active Problem List   Diagnosis Date Noted   Post-COVID syndrome 09/16/2022    Priority: High   History of pulmonary embolism 10/10/2021    Priority: High   Chronic diastolic CHF (congestive heart failure) (HCC) 10/10/2021    Priority: High   CAD (coronary artery disease) 04/03/2021    Priority: High   Aortic atherosclerosis (HCC) 05/17/2020    Priority: High   Idiopathic neuropathy 01/28/2020    Priority: High   Recurrent falls 01/28/2020    Priority: High   History of prostate cancer 03/08/2017    Priority: High   High risk medication use 02/28/2017    Priority: High   Ataxia 05/18/2016    Priority: High   Vestibular disequilibrium 05/18/2016    Priority: High   Deposits (accretions) on teeth 09/16/2022    Priority: Medium    Chronic pansinusitis 07/11/2022    Priority: Medium    CKD (chronic kidney disease), stage III (HCC) 10/10/2021    Priority: Medium    Hyperlipidemia 10/10/2021    Priority: Medium    B12 deficiency 05/17/2020    Priority: Medium    Iliac artery aneurysm (HCC) 05/17/2020    Priority: Medium    Osteoporosis 04/04/2020    Priority: Medium    GERD (gastroesophageal reflux disease) 12/22/2019    Priority: Medium    Essential tremor 12/22/2019    Priority: Medium    Kidney stone 11/05/2017    Priority: Medium    Peripheral edema 06/08/2012    Priority: Medium    Backache 04/21/2008    Priority: Medium    History of peptic ulcer disease 04/21/2008    Priority: Medium    Essential hypertension 10/29/2007    Priority: Medium    UTI due to Klebsiella species 11/11/2022    Priority: Low   Sensorineural hearing loss, bilateral  09/16/2022    Priority: Low   Chronic anticoagulation 03/26/2021    Priority: Low   Acquired coagulation disorder (HCC) 12/12/2020    Priority: Low   Senile purpura (HCC) 12/12/2020    Priority: Low   History of total right hip replacement 03/08/2017    Priority: Low   DDD (degenerative disc disease), thoracic 02/28/2017    Priority: Low   Lung nodule, solitary 05/24/2014    Priority: Low   Fall 01/05/2024   Basal cell carcinoma of scalp 09/16/2022   Nontoxic single thyroid  nodule 09/16/2022   Thyroid  nodule 09/16/2022   Non-seasonal allergic rhinitis 06/11/2022   Abrasion of right arm 11/19/2021   Femur fracture, right (HCC) 10/17/2021   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 30 tablet 0   apixaban  (ELIQUIS ) 2.5 MG  TABS tablet Take 1 tablet (2.5 mg total) by mouth 2 (two) times daily.     atorvastatin  (LIPITOR) 20 MG tablet Take 1 tablet (20 mg total) by mouth at bedtime. (Patient taking differently: Take 20 mg by mouth at bedtime.) 90 tablet 3   carvedilol  (COREG ) 25 MG tablet Take 25 mg by mouth 2 (two) times daily with a meal.     cetirizine (ZYRTEC) 10 MG tablet Take 10 mg by mouth as needed for allergies.     Cholecalciferol  (VITAMIN D3) 50 MCG (2000 UT) capsule Take 2,000 Units by mouth in the morning and at bedtime.      fluticasone  (FLONASE ) 50 MCG/ACT nasal spray Place 1-2 sprays into both nostrils daily as needed for allergies or  rhinitis.     furosemide  (LASIX ) 20 MG tablet Take 1 tablet (20 mg total) by mouth daily as needed for fluid (if the feet and/or legs swell or weight increased 3 lbs in a day or 5 lbs in 3 days). 30 tablet 5   Multiple Vitamin (MULTIVITAMIN) capsule Take 1 capsule by mouth daily.     pantoprazole  (PROTONIX ) 40 MG tablet Take 40 mg by mouth 2 (two) times daily before a meal.     potassium citrate  (UROCIT-K ) 10 MEQ (1080 MG) SR tablet Take 1 tablet (10 mEq total) by mouth as directed. 90 tablet 1   DULoxetine  (CYMBALTA ) 30 MG capsule Take 1 capsule (30 mg total) by mouth daily. 90 capsule 3   No current facility-administered medications for this visit.     Objective:  BP 134/76   Pulse 67   Temp 98.4 F (36.9 C) (Temporal)   Ht 5' 11 (1.803 m)   SpO2 97%   BMI 34.59 kg/m  Gen: NAD, resting comfortably, masked facies-has difficulty smiling bilaterally CV: RRR no murmurs rubs or gallops Lungs: CTAB no crackles, wheeze, rhonch Ext: 1+ edema right greater than left Skin: warm, dry Neuro: In his wheelchair today    Assessment and Plan   # Hospitalization and rehab follow-up for right femur fracture S: Patient was hospitalized from 06/27/2024 to 07/12/2024 after he had a displaced fracture of the condyle of the right femur occurring after a fall requiring right femur ORIF with [facility]-he had a similar fracture in this area 10/17/2021.  He was discharged with oxycodone  for pain management and given Narcan as well if needed - Only medication change noted was Cymbalta  to 60 mg- hed prefer to be back on 30 mg- denies depression and states reminder this has been used for pain primarily  -For his chronic conditions he was maintained on amlodipine  for blood pressure as well as Lasix  20 mg as needed- 3-4 days a week with edema.  Also on carvedilol  25 mg twice daily - He is maintained on Eliquis  2.5 mg twice daily for pulmonary embolism history and prevention with ongoing secondary  activity - He was maintained on atorvastatin  20 mg for cholesterol -he was maintained on chronic iron  He was discharged to [facility] after the 2-week hospitalization in [place] and eventually left [place] on August 22.  He worked with physical therapy both inpatient and at [facility].  He has already had follow-up with [facility] 07/19/2024.  X-rays appeared stable though they did note a large effusion within the knee which is not unexpected status post surgery.  He had another follow-up visit on 08/17/2024.  He was able to start walking at that point per instructions from orthopedics and they did a referral  to outpatient physical therapy.  24-month follow-up planned  He still has a lot of pain near the knee but not as bad as when transferred to [facility]. Some pain in hip as getting more mobile- having to be cautious as a result  Some ongoing dizziness- nearly had fall with physical therapy this morning. Tylenol  near 4g a day- encouraged 3g max. [facility].   Also had liver nodules and we had ordered test- they [facility] and if not can call [facility] A/P: Patient is slowly progressing after right femur fracture-he has kept close follow-up with [facility] after fracture and occurred in [place].  He is on Tylenol  for pain primarily but I asked him to reduce his total daily dosage to 3 g from 4 g.  Offered liver function test today but he wants to do this at the [place] which is upcoming soon.  Also asked for CBC due to anemia previously noted.  Did encourage to keep follow-up with [facility] in 2 months from last visit - Still getting knee pain but with activity also getting some hip pain so he is having to be cautious about advancing  # Frequent falls/shuffling gait/potential masked facies-as I examined him today seated in his wheelchair I noted a very slight tremor in his right hand at rest-he denies noting any tremor there other than with intention at times such as trying  to write.  He feels off balance and has had multiple falls including his most recent one which led to femur fracture he has been in a wheelchair most of the time I have seen him due to fall risk at least since I believe 2021-and wife does report shuffling gait despite multiple attempts from PT to adjust this.  I also asked about potential masked facies-family jokes that he is Mr. Chuckie due to lack of expression.  He is single for neurological Associates in the past but not a movement disorder specialist-they previously thought his balance issues were related to a combination of neuropathy and degenerative disc disease-we discussed this is certainly possible but I would like for him to be evaluated by Dr. Evonnie for her opinion on Parkinson's-they are open and referral was placed  I bmet-patient has been on Cymbalta  30 mg for some time for neuropathic pain/neuropathy but the hospital increased him to 60 mg-unclear if he is seeing significant benefit and we opted to trial back the 30 mg dose.  He denies history of depression  # History of pulmonary embolism-remains on Eliquis  2.5 mg twice daily for prevention   # Essential hypertension-well-controlled with Coreg  25 mg twice daily and Lasix  as needed-continue current medication.  He also takes potassium if he takes his Lasix   # Hyperlipidemia in patient with CAD-LDL at goal of 70 or less with atorvastatin  20 mg-continue current medication Lab Results  Component Value Date   CHOL 112 01/12/2024   HDL 33.40 (L) 01/12/2024   LDLCALC 61 01/12/2024   LDLDIRECT 145.4 06/27/2011   TRIG 87.0 01/12/2024   CHOLHDL 3 01/12/2024    Recommended follow up: Return in about 4 months (around 01/02/2025) for followup or sooner if needed.Schedule b4 you leave. Future Appointments  Date Time Provider Department Center  09/23/2024 12:45 PM [facility] R[facility]in, NP [facility] None  02/22/2025  1:40 PM [facility] ANNUAL WELLNESS VISIT 1 [facility] Milian    Lab/Order  associations:   ICD-10-CM   1. Other fracture of right femur, initial encounter for closed  fracture (HCC)  S72.8X1A     2. Frequent falls  R29.6 Ambulatory referral to Neurology    3. Shuffling gait  R26.89 Ambulatory referral to Neurology    4. Essential hypertension  I10       Meds ordered this encounter  Medications   DULoxetine  (CYMBALTA ) 30 MG capsule    Sig: Take 1 capsule (30 mg total) by mouth daily.    Dispense:  90 capsule    Refill:  3    Return precautions advised.  Garnette Lukes, MD

## 2024-09-02 NOTE — Patient Instructions (Addendum)
 We have placed a referral for you today to Dr. Evonnie with [facility] - please call their # if you do not hear within a week (may be listed below or you may see mychart message within a few days with #).  I am not saying this is Parkinson's but I just want to make sure and she is the expert in this area.   Cymbalta  go back down to 30 mg- see printed prescription and [facility] you  Ask them to include liver testing- so CBC and CMP at least at upcoming labs and would love to have copy here   Recommended follow up: Return in about 4 months (around 01/02/2025) for followup or sooner if needed.Schedule b4 you leave.

## 2024-09-07 ENCOUNTER — Telehealth: Payer: Self-pay | Admitting: *Deleted

## 2024-09-07 DIAGNOSIS — I110 Hypertensive heart disease with heart failure: Secondary | ICD-10-CM | POA: Diagnosis not present

## 2024-09-07 DIAGNOSIS — Z86718 Personal history of other venous thrombosis and embolism: Secondary | ICD-10-CM | POA: Diagnosis not present

## 2024-09-07 DIAGNOSIS — Z556 Problems related to health literacy: Secondary | ICD-10-CM | POA: Diagnosis not present

## 2024-09-07 DIAGNOSIS — I509 Heart failure, unspecified: Secondary | ICD-10-CM | POA: Diagnosis not present

## 2024-09-07 DIAGNOSIS — Z7901 Long term (current) use of anticoagulants: Secondary | ICD-10-CM | POA: Diagnosis not present

## 2024-09-07 DIAGNOSIS — N289 Disorder of kidney and ureter, unspecified: Secondary | ICD-10-CM | POA: Diagnosis not present

## 2024-09-07 DIAGNOSIS — K219 Gastro-esophageal reflux disease without esophagitis: Secondary | ICD-10-CM | POA: Diagnosis not present

## 2024-09-07 DIAGNOSIS — Z604 Social exclusion and rejection: Secondary | ICD-10-CM | POA: Diagnosis not present

## 2024-09-07 DIAGNOSIS — Z9181 History of falling: Secondary | ICD-10-CM | POA: Diagnosis not present

## 2024-09-07 DIAGNOSIS — S72411D Displaced unspecified condyle fracture of lower end of right femur, subsequent encounter for closed fracture with routine healing: Secondary | ICD-10-CM | POA: Diagnosis not present

## 2024-09-07 NOTE — Telephone Encounter (Signed)
 Copied from CRM [id number]. Topic: General - Other >> Sep 07, 2024 11:47 AM Macario HERO wrote: Reason for CRM: Dick from [facility] calling to report patients' blood pressure reading before the walk 150/70 and after the walk 170/90. Also wants to report his right leg is swollen and warm to touch.  Callback: [phone]

## 2024-09-07 NOTE — Telephone Encounter (Signed)
 Blood pressure was okay in office-lets get some more home checks before making any adjustments  For red leg-would recommend an office evaluation

## 2024-09-08 NOTE — Telephone Encounter (Signed)
Left voicemail for patient regarding scheduling appt.

## 2024-09-09 DIAGNOSIS — Z9181 History of falling: Secondary | ICD-10-CM | POA: Diagnosis not present

## 2024-09-09 DIAGNOSIS — Z556 Problems related to health literacy: Secondary | ICD-10-CM | POA: Diagnosis not present

## 2024-09-09 DIAGNOSIS — N289 Disorder of kidney and ureter, unspecified: Secondary | ICD-10-CM | POA: Diagnosis not present

## 2024-09-09 DIAGNOSIS — I509 Heart failure, unspecified: Secondary | ICD-10-CM | POA: Diagnosis not present

## 2024-09-09 DIAGNOSIS — Z86718 Personal history of other venous thrombosis and embolism: Secondary | ICD-10-CM | POA: Diagnosis not present

## 2024-09-09 DIAGNOSIS — Z604 Social exclusion and rejection: Secondary | ICD-10-CM | POA: Diagnosis not present

## 2024-09-09 DIAGNOSIS — S72411D Displaced unspecified condyle fracture of lower end of right femur, subsequent encounter for closed fracture with routine healing: Secondary | ICD-10-CM | POA: Diagnosis not present

## 2024-09-09 DIAGNOSIS — I110 Hypertensive heart disease with heart failure: Secondary | ICD-10-CM | POA: Diagnosis not present

## 2024-09-09 DIAGNOSIS — Z7901 Long term (current) use of anticoagulants: Secondary | ICD-10-CM | POA: Diagnosis not present

## 2024-09-09 DIAGNOSIS — K219 Gastro-esophageal reflux disease without esophagitis: Secondary | ICD-10-CM | POA: Diagnosis not present

## 2024-09-10 ENCOUNTER — Encounter: Payer: Self-pay | Admitting: Family Medicine

## 2024-09-10 ENCOUNTER — Ambulatory Visit (INDEPENDENT_AMBULATORY_CARE_PROVIDER_SITE_OTHER): Admitting: Family Medicine

## 2024-09-10 VITALS — BP 148/78 | HR 63 | Temp 98.1°F | Ht 71.0 in | Wt 234.2 lb

## 2024-09-10 DIAGNOSIS — M7989 Other specified soft tissue disorders: Secondary | ICD-10-CM

## 2024-09-10 DIAGNOSIS — I10 Essential (primary) hypertension: Secondary | ICD-10-CM

## 2024-09-10 DIAGNOSIS — M25461 Effusion, right knee: Secondary | ICD-10-CM | POA: Diagnosis not present

## 2024-09-10 DIAGNOSIS — I5032 Chronic diastolic (congestive) heart failure: Secondary | ICD-10-CM

## 2024-09-10 MED ORDER — PREDNISONE 20 MG PO TABS: ORAL_TABLET | ORAL | 0 refills | Status: DC

## 2024-09-10 NOTE — Progress Notes (Signed)
 Phone [phone] In person visit   Subjective:   Bruce Ball is a 78 y.o. year old very pleasant male patient who presents for/with See problem oriented charting Chief Complaint  Patient presents with   Leg Swelling    Worsening over a week; knee and hip pain;    Past Medical History-  Patient Active Problem List   Diagnosis Date Noted   Post-COVID syndrome 09/16/2022    Priority: High   History of pulmonary embolism 10/10/2021    Priority: High   Chronic diastolic CHF (congestive heart failure) (HCC) 10/10/2021    Priority: High   CAD (coronary artery disease) 04/03/2021    Priority: High   Aortic atherosclerosis (HCC) 05/17/2020    Priority: High   Idiopathic neuropathy 01/28/2020    Priority: High   Recurrent falls 01/28/2020    Priority: High   History of prostate cancer 03/08/2017    Priority: High   High risk medication use 02/28/2017    Priority: High   Ataxia 05/18/2016    Priority: High   Vestibular disequilibrium 05/18/2016    Priority: High   Deposits (accretions) on teeth 09/16/2022    Priority: Medium    Chronic pansinusitis 07/11/2022    Priority: Medium    CKD (chronic kidney disease), stage III (HCC) 10/10/2021    Priority: Medium    Hyperlipidemia 10/10/2021    Priority: Medium    B12 deficiency 05/17/2020    Priority: Medium    Iliac artery aneurysm (HCC) 05/17/2020    Priority: Medium    Osteoporosis 04/04/2020    Priority: Medium    GERD (gastroesophageal reflux disease) 12/22/2019    Priority: Medium    Essential tremor 12/22/2019    Priority: Medium    Kidney stone 11/05/2017    Priority: Medium    Peripheral edema 06/08/2012    Priority: Medium    Backache 04/21/2008    Priority: Medium    History of peptic ulcer disease 04/21/2008    Priority: Medium    Essential hypertension 10/29/2007    Priority: Medium    UTI due to Klebsiella species 11/11/2022    Priority: Low   Sensorineural hearing loss, bilateral 09/16/2022     Priority: Low   Chronic anticoagulation 03/26/2021    Priority: Low   Acquired coagulation disorder (HCC) 12/12/2020    Priority: Low   Senile purpura (HCC) 12/12/2020    Priority: Low   History of total right hip replacement 03/08/2017    Priority: Low   DDD (degenerative disc disease), thoracic 02/28/2017    Priority: Low   Lung nodule, solitary 05/24/2014    Priority: Low   Fall 01/05/2024   Basal cell carcinoma of scalp 09/16/2022   Nontoxic single thyroid  nodule 09/16/2022   Thyroid  nodule 09/16/2022   Non-seasonal allergic rhinitis 06/11/2022   Abrasion of right arm 11/19/2021   Femur fracture, right (HCC) 10/17/2021   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 30 tablet 0   apixaban  (ELIQUIS ) 2.5 MG  TABS tablet Take 1 tablet (2.5 mg total) by mouth 2 (two) times daily.     atorvastatin  (LIPITOR) 20 MG tablet Take 1 tablet (20 mg total) by mouth at bedtime. (Patient taking differently: Take 20 mg by mouth at bedtime.) 90 tablet 3   carvedilol  (COREG ) 25 MG tablet Take 25 mg by mouth 2 (two) times daily with a meal.     cetirizine (ZYRTEC) 10 MG tablet Take 10 mg by mouth as needed for allergies.     Cholecalciferol  (VITAMIN D3) 50 MCG (2000 UT) capsule Take 2,000 Units by mouth in the morning and at bedtime.      DULoxetine  (CYMBALTA ) 30 MG capsule Take 1 capsule (30 mg total) by mouth daily. 90 capsule 3   fluticasone  (FLONASE ) 50 MCG/ACT  nasal spray Place 1-2 sprays into both nostrils daily as needed for allergies or rhinitis.     furosemide  (LASIX ) 20 MG tablet Take 1 tablet (20 mg total) by mouth daily as needed for fluid (if the feet and/or legs swell or weight increased 3 lbs in a day or 5 lbs in 3 days). 30 tablet 5   Multiple Vitamin (MULTIVITAMIN) capsule Take 1 capsule by mouth daily.     pantoprazole  (PROTONIX ) 40 MG tablet Take 40 mg by mouth 2 (two) times daily before a meal.     potassium citrate  (UROCIT-K ) 10 MEQ (1080 MG) SR tablet Take 1 tablet (10 mEq total) by mouth as directed. 90 tablet 1   predniSONE  (DELTASONE ) 20 MG tablet Take 2 pills for 3 days, 1 pill for 4 days 10 tablet 0   No current facility-administered medications for this visit.     Objective:  BP (!) 148/78 (BP Location: Left Arm, Patient Position: Sitting, Cuff Size: Normal)   Pulse 63   Temp 98.1 F (36.7 C) (Temporal)   Ht 5' 11 (1.803 m)   Wt 234 lb 3.2 oz (106.2 kg)   SpO2 95%   BMI 32.66 kg/m  Gen: NAD, resting comfortably CV: RRR no murmurs rubs or gallops Lungs: CTAB no crackles, wheeze, rhonchi Abdomen: soft/nontender/nondistended/normal bowel sounds. No rebound or guarding.  Ext: Trace edema on left, 1+ on the right leg extending throughout the lower leg and also appears to be slightly larger in the thigh.  Knee with at least 10 x 10 cm area of edema, warmth without significant tenderness over the patella, no rash but some scarring noted Skin: warm, dry    Assessment and Plan    # Right leg swelling-most pronounced over the knee S: Home health nursing noted right leg swollen warm to touch 09/07/2024-since that time the entire leg seems swollen but most focused at the right knee and he can get pain up to 9 out of 10 with ambulation but pain resolves completely with rest.  This is on the side of his prior right femur fracture and right femur ORIF.  At visit on September 4 was having some knee pain but no redness or  swelling  Patient has known diastolic CHF in regards to edema and is already on Lasix  20 mg daily as needed-he has taken this without benefit.  Blood pressure was also well-controlled at last visit on carvedilol  25 mg twice daily and Lasix  as needed but has been trending up at home.  Also of note has history of pulmonary embolism and remains on Eliquis  2.5 mg twice daily for prevention A/P: Entire right leg appears swollen-differential is very broad.  With his history of pulmonary  embolism I have ordered stat ultrasound but this likely cannot be obtained until next week-we opted to increase his Eliquis  to 5 mg twice daily short-term until we can get this done to be more cautious though discussed this is not full treatment dose if has new acute DVT  Since unilateral I doubt CHF related but he can continue his Lasix  as needed-appears well-controlled  The swelling of the knee is rather substantial-may be joint effusion versus significant prepatellar bursitis.  No obvious trauma but looking back does look like orthopedics thought possibility of gout in 2022.  He has no systemic symptoms and pain at rest is minimal so doubt septic joint.  I think it is reasonable to trial prednisone  to cover for gout, pseudogout, bursitis.  He does not tolerate ice on the knee so we are going to hold off on that  We will also do urgent referral to sports medicine.  His wife may stay in town she had plans to travel but may need to stay in town to help coordinate this or get him to the emergency department if needed if symptoms worsen-we discussed multiple precautions to seek care such as worsening knee pain, worsening leg swelling, new shortness of breath or chest pain  # Hypertension-having some discomfort today in the hip and we suspect that is why the blood pressure is elevated-definitely want to recheck that next visit-has been somewhat high at home with physical therapy as well but want to address this acute condition  and reevaluate-actually wonder if prednisone  can help blood pressure if pain improves but sometimes can raise blood pressure  Recommended follow up: Return for as needed for new, worsening, persistent symptoms. Future Appointments  Date Time Provider Department Center  09/23/2024 12:45 PM Whitfield Raisin, NP [facility] None  02/22/2025  1:40 PM [facility] ANNUAL WELLNESS VISIT 1 [facility] Bruce Ball    Lab/Order associations:   ICD-10-CM   1. Right leg swelling  M79.89 US  Venous Img Lower Unilateral Right (DVT)    2. Effusion, right knee  M25.461 Ambulatory referral to Sports Medicine    3. Chronic diastolic CHF (congestive heart failure) (HCC)  I50.32     4. Essential hypertension  I10       Meds ordered this encounter  Medications   predniSONE  (DELTASONE ) 20 MG tablet    Sig: Take 2 pills for 3 days, 1 pill for 4 days    Dispense:  10 tablet    Refill:  0    Return precautions advised.  Garnette Lukes, MD

## 2024-09-10 NOTE — Patient Instructions (Addendum)
 Take 2 eliquis  twice daily through weekend and we will try to rule out clot on Monday hopefully  Urgent ultrasound ordered- team please put in stat chat for next week  If worsening knee pain, leg swelling, or new shortness of breath please seek care immediately over the weekend  I do not think its infected right now with lack of significant pain with rest but there is a chance- and would need to have fluid off of the joint before starting antibiotics  We will trial prednisone  short course in case gout, pseudogout, bursitis (large)  Recommended follow up: Return for as needed for new, worsening, persistent symptoms. We will be touching base next week

## 2024-09-13 ENCOUNTER — Ambulatory Visit (HOSPITAL_BASED_OUTPATIENT_CLINIC_OR_DEPARTMENT_OTHER)

## 2024-09-13 ENCOUNTER — Ambulatory Visit: Payer: Self-pay | Admitting: Family Medicine

## 2024-09-13 DIAGNOSIS — M7989 Other specified soft tissue disorders: Secondary | ICD-10-CM | POA: Diagnosis not present

## 2024-09-13 NOTE — Addendum Note (Signed)
 Addended by: KATRINKA GARNETTE KIDD on: 09/13/2024 10:08 AM   Modules accepted: Orders

## 2024-09-13 NOTE — Addendum Note (Signed)
 Addended by: KATRINKA GARNETTE KIDD on: 09/13/2024 10:51 AM   Modules accepted: Orders

## 2024-09-14 ENCOUNTER — Ambulatory Visit: Payer: Self-pay

## 2024-09-14 ENCOUNTER — Ambulatory Visit (INDEPENDENT_AMBULATORY_CARE_PROVIDER_SITE_OTHER)

## 2024-09-14 ENCOUNTER — Telehealth: Payer: Self-pay | Admitting: Family Medicine

## 2024-09-14 ENCOUNTER — Ambulatory Visit (INDEPENDENT_AMBULATORY_CARE_PROVIDER_SITE_OTHER): Admitting: Sports Medicine

## 2024-09-14 VITALS — HR 62 | Ht 71.0 in | Wt 234.0 lb

## 2024-09-14 DIAGNOSIS — G8929 Other chronic pain: Secondary | ICD-10-CM

## 2024-09-14 DIAGNOSIS — Z556 Problems related to health literacy: Secondary | ICD-10-CM | POA: Diagnosis not present

## 2024-09-14 DIAGNOSIS — Z9181 History of falling: Secondary | ICD-10-CM | POA: Diagnosis not present

## 2024-09-14 DIAGNOSIS — I110 Hypertensive heart disease with heart failure: Secondary | ICD-10-CM | POA: Diagnosis not present

## 2024-09-14 DIAGNOSIS — M25561 Pain in right knee: Secondary | ICD-10-CM

## 2024-09-14 DIAGNOSIS — Z7901 Long term (current) use of anticoagulants: Secondary | ICD-10-CM | POA: Diagnosis not present

## 2024-09-14 DIAGNOSIS — Z604 Social exclusion and rejection: Secondary | ICD-10-CM | POA: Diagnosis not present

## 2024-09-14 DIAGNOSIS — S7291XD Unspecified fracture of right femur, subsequent encounter for closed fracture with routine healing: Secondary | ICD-10-CM | POA: Diagnosis not present

## 2024-09-14 DIAGNOSIS — R600 Localized edema: Secondary | ICD-10-CM

## 2024-09-14 DIAGNOSIS — S72411D Displaced unspecified condyle fracture of lower end of right femur, subsequent encounter for closed fracture with routine healing: Secondary | ICD-10-CM | POA: Diagnosis not present

## 2024-09-14 DIAGNOSIS — K219 Gastro-esophageal reflux disease without esophagitis: Secondary | ICD-10-CM | POA: Diagnosis not present

## 2024-09-14 DIAGNOSIS — I509 Heart failure, unspecified: Secondary | ICD-10-CM | POA: Diagnosis not present

## 2024-09-14 DIAGNOSIS — Z86718 Personal history of other venous thrombosis and embolism: Secondary | ICD-10-CM | POA: Diagnosis not present

## 2024-09-14 DIAGNOSIS — N289 Disorder of kidney and ureter, unspecified: Secondary | ICD-10-CM | POA: Diagnosis not present

## 2024-09-14 NOTE — Patient Instructions (Addendum)
 Increase lasiks to 40 mg daily for 5 days   Recommend elevating leg   If any fever, or chills go to Emergency Room   Cardiology referral   As needed follow up

## 2024-09-14 NOTE — Telephone Encounter (Unsigned)
 Copied from CRM [id number]. Topic: General - Other >> Sep 14, 2024  3:33 PM Berneda F wrote: Reason for CRM: Radovan OT from [facility] home health would like pcp to know that he completed the OT today and for now he does not need further OT at this time and will continue PT (not an order, just notifying PCP)  If you need to reach him back, please call him at [phone]

## 2024-09-14 NOTE — Progress Notes (Signed)
 Ben Alvaretta Eisenberger D.[facility]MENTE[facility] [address] [place] [address] Phone: [phone]   Assessment and Plan:     1. Chronic pain of right knee (Primary) 2. Bilateral lower extremity edema -Chronic with exacerbation, initial sports medicine visit - Worsening of bilateral lower extremity edema progressing over the past 2 to 3 weeks, significantly worse on right side compared to left.  History of internal right femoral prosthesis with recent right femur fracture and ORIF performed ~3 months ago.  Swelling from surgery had resolved and patient had returned to baseline until new swelling 2 to 3 weeks ago.  Continued broad differential.  Lower extremity Doppler ultrasound was negative for blood clot and patient is currently taking Eliquis  5 mg twice daily.  X-ray performed at today's visit and shows hardware in place without acute fracture or dislocation.  Based on diffuse swelling of lower extremity, not consistent with gout or bursitis or septic arthritis flare.  Symptoms are currently bilateral, though significantly worse on right compared to left, so known diastolic CHF could be a contributing factor - May continue prednisone  course - May continue Eliquis  2.5 mg or 5 mg twice daily.  Will defer to primary care now that Doppler ultrasound is negative for DVT - Recommend increasing Lasix  dose to 40 mg daily for 5 days and reestablishing with cardiology.  Could consider repeat echo - No local or systemic signs of infection.  No open lesion, no fever, no chills, no malaise, no erythema to right lower extremity.  If symptoms change, recommend ER evaluation  Pertinent previous records reviewed include primary care note 09/10/2024, Doppler ultrasound 09/13/2024  Patient accompanied by his wife who helped provide HPI  Follow Up: As needed   Subjective:   I, Chestine Reeves, am serving as a Neurosurgeon for Doctor Morene Mace  Chief Complaint: right knee pain   HPI:    09/14/24 Patient is a 78 year old male with right knee pain. Patient states was seen by PCP 09/10/2024 Home health nursing noted right leg swollen warm to touch 09/07/2024-since that time the entire leg seems swollen but most focused at the right knee and he can get pain up to 9 out of 10 with ambulation but pain resolves completely with rest. This is on the side of his prior right femur fracture and right femur ORIF. At visit on September 4 was having some knee pain but no redness or swelling   He does have some pitting edema in his legs. He does endorse pain. Prednisone  has been helping. Hx of neuropathy does have numbness and tingling  Relevant Historical Information: Hypertension, diastolic CHF, GERD, on chronic anticoagulation with Eliquis   Additional pertinent review of systems negative.   Current Outpatient Medications:    acetaminophen  (TYLENOL ) 500 MG tablet, Take 2 tablets (1,000 mg total) by mouth every 6 (six) hours as needed for headache, fever, moderate pain (pain score 4-6) or mild pain (pain score 1-3)., Disp: 30 tablet, Rfl: 0   apixaban  (ELIQUIS ) 2.5 MG TABS tablet, Take 1 tablet (2.5 mg total) by mouth 2 (two) times daily., Disp: , Rfl:    atorvastatin  (LIPITOR) 20 MG tablet, Take 1 tablet (20 mg total) by mouth at bedtime. (Patient taking differently: Take 20 mg by mouth at bedtime.), Disp: 90 tablet, Rfl: 3   carvedilol  (COREG ) 25 MG tablet, Take 25 mg by mouth 2 (two) times daily with a meal., Disp: , Rfl:    cetirizine (ZYRTEC) 10 MG tablet, Take 10  mg by mouth as needed for allergies., Disp: , Rfl:    Cholecalciferol  (VITAMIN D3) 50 MCG (2000 UT) capsule, Take 2,000 Units by mouth in the morning and at bedtime. , Disp: , Rfl:    DULoxetine  (CYMBALTA ) 30 MG capsule, Take 1 capsule (30 mg total) by mouth daily., Disp: 90 capsule, Rfl: 3   fluticasone  (FLONASE ) 50 MCG/ACT nasal spray, Place 1-2 sprays into both nostrils daily as needed for allergies or rhinitis., Disp: , Rfl:     furosemide  (LASIX ) 20 MG tablet, Take 1 tablet (20 mg total) by mouth daily as needed for fluid (if the feet and/or legs swell or weight increased 3 lbs in a day or 5 lbs in 3 days)., Disp: 30 tablet, Rfl: 5   Multiple Vitamin (MULTIVITAMIN) capsule, Take 1 capsule by mouth daily., Disp: , Rfl:    pantoprazole  (PROTONIX ) 40 MG tablet, Take 40 mg by mouth 2 (two) times daily before a meal., Disp: , Rfl:    potassium citrate  (UROCIT-K ) 10 MEQ (1080 MG) SR tablet, Take 1 tablet (10 mEq total) by mouth as directed., Disp: 90 tablet, Rfl: 1   predniSONE  (DELTASONE ) 20 MG tablet, Take 2 pills for 3 days, 1 pill for 4 days, Disp: 10 tablet, Rfl: 0   Objective:     Vitals:   09/14/24 1306  Pulse: 62  SpO2: 97%  Weight: 234 lb (106.1 kg)  Height: 5' 11 (1.803 m)      Body mass index is 32.64 kg/m.    Physical Exam:    General:  awake, alert oriented, no acute distress nontoxic Skin: no suspicious lesions or rashes Neuro:sensation intact and strength 5/5 with no deficits, no atrophy, normal muscle tone Psych: No signs of anxiety, depression or other mood disorder  Right leg/knee: Diffuse 2+ pitting edema from the mid thigh distal on right, and 1+ pitting edema on left lower extremity distal to knee No deformity ROM Flex 95, Ext 10 with pain throughout ROM Diffusely tender due to significant swelling Gait abnormal.  Wheelchair-bound in clinic.   Electronically signed by:  Odis Mace D.[facility] 1:48 PM 09/14/24

## 2024-09-15 DIAGNOSIS — Z9181 History of falling: Secondary | ICD-10-CM | POA: Diagnosis not present

## 2024-09-15 DIAGNOSIS — Z86718 Personal history of other venous thrombosis and embolism: Secondary | ICD-10-CM | POA: Diagnosis not present

## 2024-09-15 DIAGNOSIS — Z7901 Long term (current) use of anticoagulants: Secondary | ICD-10-CM | POA: Diagnosis not present

## 2024-09-15 DIAGNOSIS — K219 Gastro-esophageal reflux disease without esophagitis: Secondary | ICD-10-CM | POA: Diagnosis not present

## 2024-09-15 DIAGNOSIS — I509 Heart failure, unspecified: Secondary | ICD-10-CM | POA: Diagnosis not present

## 2024-09-15 DIAGNOSIS — S72411D Displaced unspecified condyle fracture of lower end of right femur, subsequent encounter for closed fracture with routine healing: Secondary | ICD-10-CM | POA: Diagnosis not present

## 2024-09-15 DIAGNOSIS — Z556 Problems related to health literacy: Secondary | ICD-10-CM | POA: Diagnosis not present

## 2024-09-15 DIAGNOSIS — I110 Hypertensive heart disease with heart failure: Secondary | ICD-10-CM | POA: Diagnosis not present

## 2024-09-15 DIAGNOSIS — N289 Disorder of kidney and ureter, unspecified: Secondary | ICD-10-CM | POA: Diagnosis not present

## 2024-09-15 DIAGNOSIS — Z604 Social exclusion and rejection: Secondary | ICD-10-CM | POA: Diagnosis not present

## 2024-09-16 DIAGNOSIS — N289 Disorder of kidney and ureter, unspecified: Secondary | ICD-10-CM | POA: Diagnosis not present

## 2024-09-16 DIAGNOSIS — Z604 Social exclusion and rejection: Secondary | ICD-10-CM | POA: Diagnosis not present

## 2024-09-16 DIAGNOSIS — K219 Gastro-esophageal reflux disease without esophagitis: Secondary | ICD-10-CM | POA: Diagnosis not present

## 2024-09-16 DIAGNOSIS — S72411D Displaced unspecified condyle fracture of lower end of right femur, subsequent encounter for closed fracture with routine healing: Secondary | ICD-10-CM | POA: Diagnosis not present

## 2024-09-16 DIAGNOSIS — Z556 Problems related to health literacy: Secondary | ICD-10-CM | POA: Diagnosis not present

## 2024-09-16 DIAGNOSIS — I509 Heart failure, unspecified: Secondary | ICD-10-CM | POA: Diagnosis not present

## 2024-09-16 DIAGNOSIS — Z86718 Personal history of other venous thrombosis and embolism: Secondary | ICD-10-CM | POA: Diagnosis not present

## 2024-09-16 DIAGNOSIS — I110 Hypertensive heart disease with heart failure: Secondary | ICD-10-CM | POA: Diagnosis not present

## 2024-09-16 DIAGNOSIS — Z9181 History of falling: Secondary | ICD-10-CM | POA: Diagnosis not present

## 2024-09-16 DIAGNOSIS — Z7901 Long term (current) use of anticoagulants: Secondary | ICD-10-CM | POA: Diagnosis not present

## 2024-09-17 DIAGNOSIS — Z8781 Personal history of (healed) traumatic fracture: Secondary | ICD-10-CM | POA: Diagnosis not present

## 2024-09-17 DIAGNOSIS — G894 Chronic pain syndrome: Secondary | ICD-10-CM | POA: Diagnosis not present

## 2024-09-17 DIAGNOSIS — Z9889 Other specified postprocedural states: Secondary | ICD-10-CM | POA: Diagnosis not present

## 2024-09-17 DIAGNOSIS — S72411D Displaced unspecified condyle fracture of lower end of right femur, subsequent encounter for closed fracture with routine healing: Secondary | ICD-10-CM | POA: Diagnosis not present

## 2024-09-20 ENCOUNTER — Ambulatory Visit: Payer: Self-pay | Admitting: Sports Medicine

## 2024-09-21 ENCOUNTER — Encounter: Payer: Self-pay | Admitting: Neurology

## 2024-09-21 DIAGNOSIS — I110 Hypertensive heart disease with heart failure: Secondary | ICD-10-CM | POA: Diagnosis not present

## 2024-09-21 DIAGNOSIS — I509 Heart failure, unspecified: Secondary | ICD-10-CM | POA: Diagnosis not present

## 2024-09-21 DIAGNOSIS — Z86718 Personal history of other venous thrombosis and embolism: Secondary | ICD-10-CM | POA: Diagnosis not present

## 2024-09-21 DIAGNOSIS — S72411D Displaced unspecified condyle fracture of lower end of right femur, subsequent encounter for closed fracture with routine healing: Secondary | ICD-10-CM | POA: Diagnosis not present

## 2024-09-21 DIAGNOSIS — Z556 Problems related to health literacy: Secondary | ICD-10-CM | POA: Diagnosis not present

## 2024-09-21 DIAGNOSIS — K219 Gastro-esophageal reflux disease without esophagitis: Secondary | ICD-10-CM | POA: Diagnosis not present

## 2024-09-21 DIAGNOSIS — Z9181 History of falling: Secondary | ICD-10-CM | POA: Diagnosis not present

## 2024-09-21 DIAGNOSIS — Z604 Social exclusion and rejection: Secondary | ICD-10-CM | POA: Diagnosis not present

## 2024-09-21 DIAGNOSIS — N289 Disorder of kidney and ureter, unspecified: Secondary | ICD-10-CM | POA: Diagnosis not present

## 2024-09-21 DIAGNOSIS — Z7901 Long term (current) use of anticoagulants: Secondary | ICD-10-CM | POA: Diagnosis not present

## 2024-09-22 ENCOUNTER — Telehealth: Payer: Self-pay | Admitting: Neurology

## 2024-09-22 NOTE — Telephone Encounter (Signed)
 Pt called to cancel appt due to knee is broken and PT is limited to transportation . Tried to offer Pt video visit , PT states  he will not be able to do appt at that time Pt will call back to rescheduled   Appt Canceled

## 2024-09-23 ENCOUNTER — Ambulatory Visit: Admitting: Adult Health

## 2024-09-27 DIAGNOSIS — Z8781 Personal history of (healed) traumatic fracture: Secondary | ICD-10-CM | POA: Diagnosis not present

## 2024-09-27 DIAGNOSIS — S72411D Displaced unspecified condyle fracture of lower end of right femur, subsequent encounter for closed fracture with routine healing: Secondary | ICD-10-CM | POA: Diagnosis not present

## 2024-09-27 DIAGNOSIS — Z9889 Other specified postprocedural states: Secondary | ICD-10-CM | POA: Diagnosis not present

## 2024-09-27 DIAGNOSIS — G894 Chronic pain syndrome: Secondary | ICD-10-CM | POA: Diagnosis not present

## 2024-09-27 DIAGNOSIS — G8929 Other chronic pain: Secondary | ICD-10-CM | POA: Diagnosis not present

## 2024-09-27 DIAGNOSIS — X58XXXD Exposure to other specified factors, subsequent encounter: Secondary | ICD-10-CM | POA: Diagnosis not present

## 2024-09-27 DIAGNOSIS — M25551 Pain in right hip: Secondary | ICD-10-CM | POA: Diagnosis not present

## 2024-09-29 ENCOUNTER — Telehealth: Payer: Self-pay | Admitting: Family Medicine

## 2024-09-29 NOTE — Telephone Encounter (Signed)
 Noted.    Copied from CRM #[id number]. Topic: Clinical - Home Health Verbal Orders >> Sep 29, 2024  8:33 AM Macario HERO wrote: Caller/Agency: Tish from [facility] Callback Number: [phone] Service Requested: Physical Therapy Frequency: 1 week 2 effective 10/6  Any new concerns about the patient? No

## 2024-09-30 DIAGNOSIS — Z7901 Long term (current) use of anticoagulants: Secondary | ICD-10-CM | POA: Diagnosis not present

## 2024-09-30 DIAGNOSIS — K219 Gastro-esophageal reflux disease without esophagitis: Secondary | ICD-10-CM | POA: Diagnosis not present

## 2024-09-30 DIAGNOSIS — N289 Disorder of kidney and ureter, unspecified: Secondary | ICD-10-CM | POA: Diagnosis not present

## 2024-09-30 DIAGNOSIS — S72411D Displaced unspecified condyle fracture of lower end of right femur, subsequent encounter for closed fracture with routine healing: Secondary | ICD-10-CM | POA: Diagnosis not present

## 2024-09-30 DIAGNOSIS — Z86718 Personal history of other venous thrombosis and embolism: Secondary | ICD-10-CM | POA: Diagnosis not present

## 2024-09-30 DIAGNOSIS — I509 Heart failure, unspecified: Secondary | ICD-10-CM | POA: Diagnosis not present

## 2024-09-30 DIAGNOSIS — Z604 Social exclusion and rejection: Secondary | ICD-10-CM | POA: Diagnosis not present

## 2024-09-30 DIAGNOSIS — I110 Hypertensive heart disease with heart failure: Secondary | ICD-10-CM | POA: Diagnosis not present

## 2024-09-30 DIAGNOSIS — Z556 Problems related to health literacy: Secondary | ICD-10-CM | POA: Diagnosis not present

## 2024-09-30 DIAGNOSIS — Z9181 History of falling: Secondary | ICD-10-CM | POA: Diagnosis not present

## 2024-10-04 DIAGNOSIS — I509 Heart failure, unspecified: Secondary | ICD-10-CM | POA: Diagnosis not present

## 2024-10-04 DIAGNOSIS — Z604 Social exclusion and rejection: Secondary | ICD-10-CM | POA: Diagnosis not present

## 2024-10-04 DIAGNOSIS — Z9181 History of falling: Secondary | ICD-10-CM | POA: Diagnosis not present

## 2024-10-04 DIAGNOSIS — K219 Gastro-esophageal reflux disease without esophagitis: Secondary | ICD-10-CM | POA: Diagnosis not present

## 2024-10-04 DIAGNOSIS — N289 Disorder of kidney and ureter, unspecified: Secondary | ICD-10-CM | POA: Diagnosis not present

## 2024-10-04 DIAGNOSIS — I110 Hypertensive heart disease with heart failure: Secondary | ICD-10-CM | POA: Diagnosis not present

## 2024-10-04 DIAGNOSIS — Z86718 Personal history of other venous thrombosis and embolism: Secondary | ICD-10-CM | POA: Diagnosis not present

## 2024-10-04 DIAGNOSIS — S72411D Displaced unspecified condyle fracture of lower end of right femur, subsequent encounter for closed fracture with routine healing: Secondary | ICD-10-CM | POA: Diagnosis not present

## 2024-10-04 DIAGNOSIS — Z7901 Long term (current) use of anticoagulants: Secondary | ICD-10-CM | POA: Diagnosis not present

## 2024-10-04 DIAGNOSIS — Z556 Problems related to health literacy: Secondary | ICD-10-CM | POA: Diagnosis not present

## 2024-10-15 ENCOUNTER — Ambulatory Visit (INDEPENDENT_AMBULATORY_CARE_PROVIDER_SITE_OTHER): Admitting: Family Medicine

## 2024-10-15 ENCOUNTER — Encounter: Payer: Self-pay | Admitting: Family Medicine

## 2024-10-15 VITALS — BP 132/72 | HR 77 | Temp 98.4°F | Ht 71.0 in | Wt 230.0 lb

## 2024-10-15 DIAGNOSIS — J0110 Acute frontal sinusitis, unspecified: Secondary | ICD-10-CM | POA: Diagnosis not present

## 2024-10-15 DIAGNOSIS — Z7901 Long term (current) use of anticoagulants: Secondary | ICD-10-CM | POA: Diagnosis not present

## 2024-10-15 DIAGNOSIS — K219 Gastro-esophageal reflux disease without esophagitis: Secondary | ICD-10-CM | POA: Diagnosis not present

## 2024-10-15 DIAGNOSIS — Z86718 Personal history of other venous thrombosis and embolism: Secondary | ICD-10-CM | POA: Diagnosis not present

## 2024-10-15 DIAGNOSIS — J3489 Other specified disorders of nose and nasal sinuses: Secondary | ICD-10-CM

## 2024-10-15 DIAGNOSIS — I10 Essential (primary) hypertension: Secondary | ICD-10-CM

## 2024-10-15 DIAGNOSIS — J029 Acute pharyngitis, unspecified: Secondary | ICD-10-CM | POA: Diagnosis not present

## 2024-10-15 DIAGNOSIS — Z556 Problems related to health literacy: Secondary | ICD-10-CM | POA: Diagnosis not present

## 2024-10-15 DIAGNOSIS — C419 Malignant neoplasm of bone and articular cartilage, unspecified: Secondary | ICD-10-CM

## 2024-10-15 DIAGNOSIS — I5032 Chronic diastolic (congestive) heart failure: Secondary | ICD-10-CM

## 2024-10-15 DIAGNOSIS — S72411D Displaced unspecified condyle fracture of lower end of right femur, subsequent encounter for closed fracture with routine healing: Secondary | ICD-10-CM | POA: Diagnosis not present

## 2024-10-15 DIAGNOSIS — Z604 Social exclusion and rejection: Secondary | ICD-10-CM | POA: Diagnosis not present

## 2024-10-15 DIAGNOSIS — N289 Disorder of kidney and ureter, unspecified: Secondary | ICD-10-CM | POA: Diagnosis not present

## 2024-10-15 DIAGNOSIS — I509 Heart failure, unspecified: Secondary | ICD-10-CM | POA: Diagnosis not present

## 2024-10-15 DIAGNOSIS — Z9181 History of falling: Secondary | ICD-10-CM | POA: Diagnosis not present

## 2024-10-15 DIAGNOSIS — I110 Hypertensive heart disease with heart failure: Secondary | ICD-10-CM | POA: Diagnosis not present

## 2024-10-15 LAB — POCT INFLUENZA A/B
Influenza A, POC: NEGATIVE
Influenza B, POC: NEGATIVE

## 2024-10-15 LAB — POC COVID19 BINAXNOW: SARS Coronavirus 2 Ag: NEGATIVE

## 2024-10-15 MED ORDER — AMOXICILLIN-POT CLAVULANATE 875-125 MG PO TABS: 1.0000 | ORAL_TABLET | Freq: Two times a day (BID) | ORAL | 0 refills | Status: AC

## 2024-10-15 NOTE — Patient Instructions (Addendum)
 patient with sinus pressure and nasal congestion now on day 5 after [place] with flu and COVID test negative. Suspect viral sinusitis but is not making much improvement yet and concerned for development of bacterial sinusitis going into the weekend. If he has worsening symptoms or if symptoms last another 5 days he will fill and take the Augmentin  that I sent in today- needs to take this with food and can cause some stomach upset. If worsening symptom(s) despite antibiotic(s) after 24-48 hours would want to see you back   Recommended follow up: Return for as needed for new, worsening, persistent symptoms.

## 2024-10-15 NOTE — Progress Notes (Signed)
 Phone [phone] In person visit   Subjective:   Bruce Ball is a 78 y.o. year old very pleasant male patient who presents for/with See problem oriented charting Chief Complaint  Patient presents with   Sinus Problem    Pt went to casino Saturday; Monday started with cough, sneezing, runny nose, no fever, scratchy throat;     Past Medical History-  Patient Active Problem List   Diagnosis Date Noted   Post-COVID syndrome 09/16/2022    Priority: High   History of pulmonary embolism 10/10/2021    Priority: High   Chronic diastolic CHF (congestive heart failure) (HCC) 10/10/2021    Priority: High   CAD (coronary artery disease) 04/03/2021    Priority: High   Aortic atherosclerosis 05/17/2020    Priority: High   Idiopathic neuropathy 01/28/2020    Priority: High   Recurrent falls 01/28/2020    Priority: High   History of prostate cancer 03/08/2017    Priority: High   High risk medication use 02/28/2017    Priority: High   Ataxia 05/18/2016    Priority: High   Vestibular disequilibrium 05/18/2016    Priority: High   Deposits (accretions) on teeth 09/16/2022    Priority: Medium    Chronic pansinusitis 07/11/2022    Priority: Medium    CKD (chronic kidney disease), stage III (HCC) 10/10/2021    Priority: Medium    Hyperlipidemia 10/10/2021    Priority: Medium    B12 deficiency 05/17/2020    Priority: Medium    Iliac artery aneurysm 05/17/2020    Priority: Medium    Osteoporosis 04/04/2020    Priority: Medium    GERD (gastroesophageal reflux disease) 12/22/2019    Priority: Medium    Essential tremor 12/22/2019    Priority: Medium    Kidney stone 11/05/2017    Priority: Medium    Peripheral edema 06/08/2012    Priority: Medium    Backache 04/21/2008    Priority: Medium    History of peptic ulcer disease 04/21/2008    Priority: Medium    Essential hypertension 10/29/2007    Priority: Medium    UTI due to Klebsiella species 11/11/2022    Priority: Low    Sensorineural hearing loss, bilateral 09/16/2022    Priority: Low   Chronic anticoagulation 03/26/2021    Priority: Low   Acquired coagulation disorder 12/12/2020    Priority: Low   Senile purpura 12/12/2020    Priority: Low   History of total right hip replacement 03/08/2017    Priority: Low   DDD (degenerative disc disease), thoracic 02/28/2017    Priority: Low   Lung nodule, solitary 05/24/2014    Priority: Low   Fall 01/05/2024   Basal cell carcinoma of scalp 09/16/2022   Nontoxic single thyroid  nodule 09/16/2022   Thyroid  nodule 09/16/2022   Non-seasonal allergic rhinitis 06/11/2022   Abrasion of right arm 11/19/2021   Femur fracture, right (HCC) 10/17/2021   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HCC) 10/16/2021   Meibomian gland dysfunction of right eye, unspecified eyelid 07/11/2020   Vitamin B12 deficient megaloblastic anemia 07/11/2020   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 30 tablet 0  amoxicillin -clavulanate (AUGMENTIN ) 875-125 MG tablet Take 1 tablet by mouth 2 (two) times daily for 7 days. 14 tablet 0   apixaban  (ELIQUIS ) 2.5 MG TABS tablet Take 1 tablet (2.5 mg total) by mouth 2 (two) times daily.     atorvastatin  (LIPITOR) 20 MG tablet Take 1 tablet (20 mg total) by mouth at bedtime. (Patient taking differently: Take 20 mg by mouth at bedtime.) 90 tablet 3   carvedilol  (COREG ) 25 MG tablet Take 25 mg by mouth 2 (two) times daily with a meal.     cetirizine (ZYRTEC) 10 MG tablet Take 10 mg by mouth as needed for allergies.     Cholecalciferol  (VITAMIN D3) 50 MCG (2000 UT) capsule Take 2,000 Units by mouth in the  morning and at bedtime.      DULoxetine  (CYMBALTA ) 30 MG capsule Take 1 capsule (30 mg total) by mouth daily. 90 capsule 3   fluticasone  (FLONASE ) 50 MCG/ACT nasal spray Place 1-2 sprays into both nostrils daily as needed for allergies or rhinitis.     furosemide  (LASIX ) 20 MG tablet Take 1 tablet (20 mg total) by mouth daily as needed for fluid (if the feet and/or legs swell or weight increased 3 lbs in a day or 5 lbs in 3 days). 30 tablet 5   Multiple Vitamin (MULTIVITAMIN) capsule Take 1 capsule by mouth daily.     pantoprazole  (PROTONIX ) 40 MG tablet Take 40 mg by mouth 2 (two) times daily before a meal.     potassium citrate  (UROCIT-K ) 10 MEQ (1080 MG) SR tablet Take 1 tablet (10 mEq total) by mouth as directed. 90 tablet 1   No current facility-administered medications for this visit.     Objective:  BP 132/72 (BP Location: Left Arm, Patient Position: Sitting, Cuff Size: Normal)   Pulse 77   Temp 98.4 F (36.9 C) (Temporal)   Ht 5' 11 (1.803 m)   Wt 230 lb (104.3 kg)   SpO2 96%   BMI 32.08 kg/m  Gen: NAD, resting comfortably Tympanic membrane is normal bilaterally.  Nasal turbinates edematous and erythematous with yellow discharge.  Oropharynx largely normal-some drainage-no tonsillar edema or erythema or discharge.  No cervical lymphadenopathy CV: RRR no murmurs rubs or gallops Lungs: CTAB no crackles, wheeze, rhonchi Ext: trace edema- missed wearing his compression  yesterday-worse on the right Skin: warm, dry Neuro: Walks with walker, masked facies    Assessment and Plan   # sinusitis S: Patient visited casino about 7 days ago and about  days ago started with deep cough (delsym has helped- was pretty intolerable prior to that), sneezing, runny nose. Throat somewhat scratchy.   No fever.  No shortness of breath . Maxillary and frontal sinus tenderness noted. Not wheezing.  A/P: patient with sinus pressure and nasal congestion now on day 5 after visiting casino with flu and  COVID test negative. Suspect viral sinusitis but is not making much improvement yet and concerned for development of bacterial sinusitis going into the weekend. If he has worsening symptoms or if symptoms last another 5 days he will fill and take the Augmentin  that I sent in today- needs to take this with food and can cause some stomach upset. If worsening symptom(s) despite antibiotic(s) after 24-48 hours would want to see you back.  No signs of strep on exam    # History of osteo chondrosarcoma-follows with [facility] given history of fracture-unfortunately there is not a diagnosis for history of this but will note the osteo chondrosarcoma  history here.  No relation to current pathology above  #hypertension # Chronic diastolic CHF S: medication: Carvedilol  25 mg twice daily.  Lasix  available if needed for edema-has not needed recently but did not use compression yesterday and has noted some worsening swelling BP Readings from Last 3 Encounters:  10/15/24 132/72  09/10/24 (!) 148/78  09/02/24 134/76  A/P: Blood pressure well-controlled-continue current medication As for CHF-has Lasix  available if needed-encouraged him to use compression stockings but if edema fails to improve would definitely take some Lasix -do not want to be dealing with fluid overload plus sinusitis  Recommended follow up: Return for as needed for new, worsening, persistent symptoms. Future Appointments  Date Time Provider Department Center  10/27/2024 10:15 AM Tat, Asberry RAMAN, DO [facility] None  10/30/2024  2:40 PM [facility] MR 3 [facility]  02/22/2025  1:40 PM [facility] ANNUAL WELLNESS VISIT 1 [facility]    Lab/Order associations:   ICD-10-CM   1. Stuffy and runny nose  J34.89 POC COVID-19    POCT Influenza A/B    2. Acute non-recurrent frontal sinusitis  J01.10     3. Sore throat  J02.9 POC COVID-19    POCT Influenza A/B    4. 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HCC)  C41.9     5. Essential hypertension  I10      6. Chronic diastolic CHF (congestive heart failure) (HCC)  I50.32       Meds ordered this encounter  Medications   amoxicillin -clavulanate (AUGMENTIN ) 875-125 MG tablet    Sig: Take 1 tablet by mouth 2 (two) times daily for 7 days.    Dispense:  14 tablet    Refill:  0    Return precautions advised.  Garnette Lukes, MD

## 2024-10-19 DIAGNOSIS — Z96649 Presence of unspecified artificial hip joint: Secondary | ICD-10-CM | POA: Diagnosis not present

## 2024-10-19 DIAGNOSIS — M978XXA Periprosthetic fracture around other internal prosthetic joint, initial encounter: Secondary | ICD-10-CM | POA: Diagnosis not present

## 2024-10-19 DIAGNOSIS — S72461D Displaced supracondylar fracture with intracondylar extension of lower end of right femur, subsequent encounter for closed fracture with routine healing: Secondary | ICD-10-CM | POA: Diagnosis not present

## 2024-10-21 DIAGNOSIS — Z9181 History of falling: Secondary | ICD-10-CM | POA: Diagnosis not present

## 2024-10-21 DIAGNOSIS — K219 Gastro-esophageal reflux disease without esophagitis: Secondary | ICD-10-CM | POA: Diagnosis not present

## 2024-10-21 DIAGNOSIS — Z86718 Personal history of other venous thrombosis and embolism: Secondary | ICD-10-CM | POA: Diagnosis not present

## 2024-10-21 DIAGNOSIS — Z7901 Long term (current) use of anticoagulants: Secondary | ICD-10-CM | POA: Diagnosis not present

## 2024-10-21 DIAGNOSIS — I110 Hypertensive heart disease with heart failure: Secondary | ICD-10-CM | POA: Diagnosis not present

## 2024-10-21 DIAGNOSIS — I509 Heart failure, unspecified: Secondary | ICD-10-CM | POA: Diagnosis not present

## 2024-10-21 DIAGNOSIS — N289 Disorder of kidney and ureter, unspecified: Secondary | ICD-10-CM | POA: Diagnosis not present

## 2024-10-21 DIAGNOSIS — S72411D Displaced unspecified condyle fracture of lower end of right femur, subsequent encounter for closed fracture with routine healing: Secondary | ICD-10-CM | POA: Diagnosis not present

## 2024-10-22 DIAGNOSIS — I110 Hypertensive heart disease with heart failure: Secondary | ICD-10-CM | POA: Diagnosis not present

## 2024-10-22 DIAGNOSIS — S72411D Displaced unspecified condyle fracture of lower end of right femur, subsequent encounter for closed fracture with routine healing: Secondary | ICD-10-CM | POA: Diagnosis not present

## 2024-10-22 DIAGNOSIS — K219 Gastro-esophageal reflux disease without esophagitis: Secondary | ICD-10-CM | POA: Diagnosis not present

## 2024-10-22 DIAGNOSIS — Z9181 History of falling: Secondary | ICD-10-CM | POA: Diagnosis not present

## 2024-10-22 DIAGNOSIS — Z86718 Personal history of other venous thrombosis and embolism: Secondary | ICD-10-CM | POA: Diagnosis not present

## 2024-10-22 DIAGNOSIS — N289 Disorder of kidney and ureter, unspecified: Secondary | ICD-10-CM | POA: Diagnosis not present

## 2024-10-22 DIAGNOSIS — I509 Heart failure, unspecified: Secondary | ICD-10-CM | POA: Diagnosis not present

## 2024-10-22 DIAGNOSIS — Z7901 Long term (current) use of anticoagulants: Secondary | ICD-10-CM | POA: Diagnosis not present

## 2024-10-25 ENCOUNTER — Telehealth: Payer: Self-pay | Admitting: Family Medicine

## 2024-10-25 NOTE — Telephone Encounter (Signed)
 Patient called to report BP which was 190/94, pulse 57 and patient is reporting a headache as well

## 2024-10-25 NOTE — Telephone Encounter (Signed)
 Please see patient message and advise.    Copied from CRM #[id number]. Topic: Clinical - Medical Advice >> Oct 22, 2024  4:35 PM Lonell PEDLAR wrote: Reason for CRM: Dick, PT called to report that pts BP was 178/90, this is after walking As well as 188/92, resting  Please contact patient to best advise.

## 2024-10-25 NOTE — Telephone Encounter (Signed)
 Last BP done at 9:00am today 173/96 pulse 64

## 2024-10-25 NOTE — Telephone Encounter (Signed)
 See my chart message

## 2024-10-25 NOTE — Telephone Encounter (Signed)
 Any new symptom(s) like chest pain, headache(s), blurry vision ? If not ok to make sure medications are in system and recheck in 2-3 hours after being seated for at least 5 minutes and back up against chair and feet flat on ground then let us  know . BP here 10 days ago looked good.

## 2024-10-26 ENCOUNTER — Encounter: Payer: Self-pay | Admitting: Family Medicine

## 2024-10-26 ENCOUNTER — Ambulatory Visit (INDEPENDENT_AMBULATORY_CARE_PROVIDER_SITE_OTHER): Admitting: Family Medicine

## 2024-10-26 VITALS — BP 162/70 | HR 62 | Temp 98.1°F | Ht 71.0 in | Wt 236.0 lb

## 2024-10-26 DIAGNOSIS — I10 Essential (primary) hypertension: Secondary | ICD-10-CM

## 2024-10-26 MED ORDER — DULOXETINE HCL 60 MG PO CPEP: 60.0000 mg | ORAL_CAPSULE | Freq: Every day | ORAL | 1 refills | Status: DC

## 2024-10-26 MED ORDER — AMLODIPINE BESYLATE 10 MG PO TABS: 10.0000 mg | ORAL_TABLET | Freq: Every day | ORAL | 3 refills | Status: AC

## 2024-10-26 NOTE — Patient Instructions (Addendum)
 Restart Cymbalta  60 mg for now until [facility] can assess you further  Restart amlodipine  at 10 mg- update me in 2-3 days- should see gradual improvement in pressure. If pressure is above 180 with headache(s) would still recommend Emergency Department   If blurry vision or headache(s) not better in next 24 hours -advise emergency department if blood pressure not trending down or if those symptoms worsen  Recommended follow up: Return in about 2 weeks (around 11/09/2024) for followup or sooner if needed.Schedule b4 you leave.

## 2024-10-26 NOTE — Progress Notes (Signed)
 Phone [phone] In person visit   Subjective:   Bruce Ball is a 78 y.o. year old very pleasant male patient who presents for/with See problem oriented charting Chief Complaint  Patient presents with   Hypertension    Blood pressure has been spiking up for the past few weeks. Has not gone back to his normal base line.     Past Medical History-  Patient Active Problem List   Diagnosis Date Noted   Post-COVID syndrome 09/16/2022    Priority: High   History of pulmonary embolism 10/10/2021    Priority: High   Chronic diastolic CHF (congestive heart failure) (HCC) 10/10/2021    Priority: High   CAD (coronary artery disease) 04/03/2021    Priority: High   Aortic atherosclerosis 05/17/2020    Priority: High   Idiopathic neuropathy 01/28/2020    Priority: High   Recurrent falls 01/28/2020    Priority: High   History of prostate cancer 03/08/2017    Priority: High   High risk medication use 02/28/2017    Priority: High   Ataxia 05/18/2016    Priority: High   Vestibular disequilibrium 05/18/2016    Priority: High   Deposits (accretions) on teeth 09/16/2022    Priority: Medium    Chronic pansinusitis 07/11/2022    Priority: Medium    CKD (chronic kidney disease), stage III (HCC) 10/10/2021    Priority: Medium    Hyperlipidemia 10/10/2021    Priority: Medium    B12 deficiency 05/17/2020    Priority: Medium    Iliac artery aneurysm 05/17/2020    Priority: Medium    Osteoporosis 04/04/2020    Priority: Medium    GERD (gastroesophageal reflux disease) 12/22/2019    Priority: Medium    Essential tremor 12/22/2019    Priority: Medium    Kidney stone 11/05/2017    Priority: Medium    Peripheral edema 06/08/2012    Priority: Medium    Backache 04/21/2008    Priority: Medium    History of peptic ulcer disease 04/21/2008    Priority: Medium    Essential hypertension 10/29/2007    Priority: Medium    UTI due to Klebsiella species 11/11/2022    Priority: Low    Sensorineural hearing loss, bilateral 09/16/2022    Priority: Low   Chronic anticoagulation 03/26/2021    Priority: Low   Acquired coagulation disorder 12/12/2020    Priority: Low   Senile purpura 12/12/2020    Priority: Low   History of total right hip replacement 03/08/2017    Priority: Low   DDD (degenerative disc disease), thoracic 02/28/2017    Priority: Low   Lung nodule, solitary 05/24/2014    Priority: Low   Fall 01/05/2024   Basal cell carcinoma of scalp 09/16/2022   Nontoxic single thyroid  nodule 09/16/2022   Thyroid  nodule 09/16/2022   Non-seasonal allergic rhinitis 06/11/2022   Abrasion of right arm 11/19/2021   Femur fracture, right (HCC) 10/17/2021   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HCC) 10/16/2021   Meibomian gland dysfunction of right eye, unspecified eyelid 07/11/2020   Vitamin B12 deficient megaloblastic anemia 07/11/2020   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 30 tablet  0   amLODipine  (NORVASC ) 10 MG tablet Take 1 tablet (10 mg total) by mouth daily. 90 tablet 3   apixaban  (ELIQUIS ) 2.5 MG TABS tablet Take 1 tablet (2.5 mg total) by mouth 2 (two) times daily.     atorvastatin  (LIPITOR) 20 MG tablet Take 1 tablet (20 mg total) by mouth at bedtime. 90 tablet 3   carvedilol  (COREG ) 25 MG tablet Take 25 mg by mouth 2 (two) times daily with a meal.     cetirizine (ZYRTEC) 10 MG tablet Take 10 mg by mouth as needed for allergies.     Cholecalciferol  50 MCG (2000 UT) TABS Take by mouth.     DULoxetine  (CYMBALTA ) 60 MG capsule Take 1 capsule (60 mg total) by mouth daily. 60 capsule 1   fluticasone  (FLONASE ) 50  MCG/ACT nasal spray Place 1-2 sprays into both nostrils daily as needed for allergies or rhinitis.     furosemide  (LASIX ) 20 MG tablet Take 1 tablet (20 mg total) by mouth daily as needed for fluid (if the feet and/or legs swell or weight increased 3 lbs in a day or 5 lbs in 3 days). 30 tablet 5   Multiple Vitamin (MULTIVITAMIN) capsule Take 1 capsule by mouth daily.     naloxone (NARCAN) nasal spray 4 mg/0.1 mL PRN     pantoprazole  (PROTONIX ) 40 MG tablet Take 40 mg by mouth 2 (two) times daily before a meal.     potassium citrate  (UROCIT-K ) 10 MEQ (1080 MG) SR tablet Take 1 tablet (10 mEq total) by mouth as directed. (Patient taking differently: Take 10 mEq by mouth as directed. PRN for swelling.) 90 tablet 1   No current facility-administered medications for this visit.     Objective:  BP (!) 162/70 (BP Location: Left Arm, Patient Position: Sitting, Cuff Size: Normal)   Pulse 62   Temp 98.1 F (36.7 C) (Tympanic)   Ht 5' 11 (1.803 m)   Wt 236 lb (107 kg)   SpO2 98%   BMI 32.92 kg/m  Gen: NAD, resting comfortably CV: RRR no murmurs rubs or gallops Lungs: CTAB no crackles, wheeze, rhonchi Ext: trace edema worse on right side Skin: warm, dry     Assessment and Plan   #social update- considering suboxone at [facility]- November visit to discuss. Surgeon at [facility] is going to try to move this up through palliative.  Patient did take a day off of Cymbalta  and was planning to stop turkey but after discussion today he is willing to restart until he sits down we will do get a plan together-reports Cymbalta  has been filled by [facility] but I gave a one-time fill as he apparently has 1 pill left  #hypertension S: medication: Amlodipine  10 mg sporadically only lately-has also been on lower doses in the past, carvedilol  25 mg twice daily, Lasix  20 mg as needed for edema -Had been on Imdur  in the past around 2023-blood pressures tended to run too low Home readings #s: Multiple high pressure readings  reported at home starting last few weeks- probably 3 weeks. Was 200/94 this morning and has had headache as well as very mild blurred vision at a distance  -Reported headache with blood pressure up to 190 yesterday-of note had recently stopped Cymbalta  which could also contribute to headache BP Readings from Last 3 Encounters:  10/26/24 (!) 162/70  10/15/24 132/72  09/10/24 (!) 148/78  A/P: Hypertension partly related directly at home and would recommend emergency department for migraine or headache which patient has  declined despite noting the symptoms-I do think restarting amlodipine  may be very helpful and have sent this in for him to take daily and not as needed.  2 to 3-week follow-up recommended.  If headache or blurry vision not improving or pressure not improving within 24 hours recommend emergency department again -Pressures do not improve may update blood work to look for endorgan damage  Recommended follow up: Return in about 2 weeks (around 11/09/2024) for followup or sooner if needed.Schedule b4 you leave. Future Appointments  Date Time Provider Department Center  10/27/2024 10:15 AM Tat, Asberry RAMAN, DO [facility] None  10/30/2024  2:40 PM [facility] MR 3 [facility]315[facility]  02/22/2025  1:40 PM [facility] ANNUAL WELLNESS VISIT 1 [facility]   Lab/Order associations:   ICD-10-CM   1. Essential hypertension  I10      Meds ordered this encounter  Medications   amLODipine  (NORVASC ) 10 MG tablet    Sig: Take 1 tablet (10 mg total) by mouth daily.    Dispense:  90 tablet    Refill:  3   DULoxetine  (CYMBALTA ) 60 MG capsule    Sig: Take 1 capsule (60 mg total) by mouth daily.    Dispense:  60 capsule    Refill:  1    Return precautions advised.  Garnette Lukes, MD

## 2024-10-26 NOTE — Progress Notes (Unsigned)
 Assessment/Plan:   1.  Tremor  -I do see some evidence of ET on the R, but no evidence of Parkinsons Disease today.  While his examination was somewhat limited by pain, I do not think that this limited my exam extensively.  Reassurance was provided.  2.  Diplopia  - Will check acetylcholine receptor antibodies today.  3.  History of back pain  - Status post transforaminal lumbar interbody fusion at L5-S1 in 2021 at [facility]  4.  History of DVT/PE  - On Eliquis   5.  History of syncope  - Noted that several of these occurred while on the toilet & vasovagal in nature.  - Looks like extensive workup, including cardiac workup and outpatient EEG with [facility], was unremarkable.  6.  Hip pain  -s/p recent femur fracture and still very tenous and careful with ambulation.  In PT  Subjective:   Bruce Ball was seen today in the movement disorders clinic for neurologic consultation at the request of Katrinka Garnette KIDD, MD.  Pt with wife who supplements history.  The consultation is for the evaluation of falls, shuffling gait and to rule out Parkinson's.  Outside records that were made available to me were reviewed.  Patient is currently a patient of [facility] and extensive medical records are reviewed.  In 2017, patient was actually seen by [facility] for vision change, headache, hearing difficulties and tremors which have been present for several years.  He saw neuro-ophthalmology at the time, and workup was negative.  He did not see anybody from neurology from 2017 until 2021, at which point he began to see Dr. Rosemarie for gait instability, felt due to peripheral neuropathy.  He had an EMG in 2021, which was performed by Dr. Jenel and felt to demonstrate mild peripheral neuropathy.  Patient then had another gap in neurologic care from 2021 until March, 2025, and then he was seen again by Dr. Rosemarie for syncope/near syncope and gait ataxia/falls.  Dr. Rosemarie  describes 2 of the episodes of syncope occurring on the toilet.  He was worked up in the hospital for the second event, including cardiac monitor, which was unremarkable.  EEG was done with [facility] in April, 2025 and was unremarkable.  MRI/MRA of the brain was done in April, 2025 and read by Dr. Rosemarie and nonacute.  This is the last time that the patient was seen at [facility] neurology.  However, since that time, the patient did have a [place], 2024.  He sustained a right distal femur fracture requiring operative management, which occurred on July 3.  He had a similar fracture of his femur in October, 2022.  He followed up with his primary care physician September 4.  Primary care physician noted masked face, shuffling, tremor and he was sent here for further evaluation.   Specific Symptoms:  Tremor: Yes.  , for over a decade (seen a [facility] in 2017 and tremor was mention).  He is R handed and thinks tremor is mostly in the R hand with writing/eating/etc and not a rest.  He sometimes holds the R hand to steady the L hand Family hx of similar:  No. Voice: yes some change but he thinks its because his hearing isn't good Sleep: trouble getting and staying asleep  Vivid Dreams:  Yes.    Acting out dreams:  he says no but then describes some episode of falling out of bed at  night a few years ago in [facility] and doesn't know how he got there. Wet Pillows: Yes.   Postural symptoms:  Yes.  , he thinks due to lightheadedness and feels some vertigo; has PN as well  Falls?  Yes.   - [place] fall was [place] and he was at gate and floor was a bit wet and fell and fx femulr; had not had a fall prior to that for 2-3 years Bradykinesia symptoms: has a lift chair but otherwise the trouble getting out of the chair si due to hip pain; uses walker with ambulation since July and shuffles since July; is slower Loss of smell:  comes and goes Loss of taste:  comes and goes Urinary  Incontinence:  No. Difficulty Swallowing:  No. Handwriting, micrographia: No., gotten bigger Trouble with ADL's:  No.  Trouble buttoning clothing: No. Depression:  No. Memory changes:  Yes.  ; does own pill box and still may have some trouble with medications; wife does household bills; doesn't drive due to memory as he would forget where going and he would have trouble getting in/out of his car (had a scooter) Hallucinations:  No.  visual distortions: Yes.   N/V:  No. Lightheaded:  Yes.    Syncope: Yes.  , on toilet several times Diplopia:  Yes.  , occurs daily, he thinks its vertical, unsure if monocular or binocular Prior exposure to reglan /antipsychotics: No.    ALLERGIES:   Allergies  Allergen Reactions   Tape Other (See Comments)    NEEDS COBAN WRAP- NO TAPE!!!!- SKIN TEARS VERY EASILY!!!!   Erythromycin Nausea And Vomiting and Other (See Comments)   Meclizine  Nausea And Vomiting   Lisinopril Other (See Comments)    Lowered the blood pressure TOO MUCH!!  All ACE Inhibitors per wife at bedside  Reaction unknown; says lowered blood pressure too much    Reaction unknown; says lowered blood pressure too much  Lowered the blood pressure TOO MUCH!!  Reaction unknown; says lowered blood pressure too much  Lowered the blood pressure TOO MUCH!!    Lowered the blood pressure TOO MUCH!! All ACE Inhibitors per wife at bedside    Reaction unknown; says lowered blood pressure too much  Lowered the blood pressure TOO MUCH!!    CURRENT MEDICATIONS:  Current Meds  Medication Sig   acetaminophen  (TYLENOL ) 500 MG tablet Take 2 tablets (1,000 mg total) by mouth every 6 (six) hours as needed for headache, fever, moderate pain (pain score 4-6) or mild pain (pain score 1-3).   amLODipine  (NORVASC ) 10 MG tablet Take 1 tablet (10 mg total) by mouth daily.   apixaban  (ELIQUIS ) 2.5 MG TABS tablet Take 1 tablet (2.5 mg total) by mouth 2 (two) times daily.   atorvastatin  (LIPITOR) 20 MG tablet  Take 1 tablet (20 mg total) by mouth at bedtime.   carvedilol  (COREG ) 25 MG tablet Take 25 mg by mouth 2 (two) times daily with a meal.   cetirizine (ZYRTEC) 10 MG tablet Take 10 mg by mouth as needed for allergies.   Cholecalciferol  50 MCG (2000 UT) TABS Take by mouth.   DULoxetine  (CYMBALTA ) 60 MG capsule Take 1 capsule (60 mg total) by mouth daily.   fluticasone  (FLONASE ) 50 MCG/ACT nasal spray Place 1-2 sprays into both nostrils daily as needed for allergies or rhinitis.   furosemide  (LASIX ) 20 MG tablet Take 1 tablet (20 mg total) by mouth daily as needed for fluid (if the feet and/or legs swell or weight  increased 3 lbs in a day or 5 lbs in 3 days).   Multiple Vitamin (MULTIVITAMIN) capsule Take 1 capsule by mouth daily.   naloxone (NARCAN) nasal spray 4 mg/0.1 mL PRN   pantoprazole  (PROTONIX ) 40 MG tablet Take 40 mg by mouth 2 (two) times daily before a meal.   potassium citrate  (UROCIT-K ) 10 MEQ (1080 MG) SR tablet Take 1 tablet (10 mEq total) by mouth as directed.     Objective:   VITALS:   Vitals:   10/27/24 1008  BP: (!) 142/84  Pulse: 62  SpO2: 98%  Weight: 236 lb (107 kg)  Height: 6' (1.829 m)    GEN:  The patient appears stated age and is in NAD. HEENT:  Normocephalic, atraumatic.  The mucous membranes are moist. The superficial temporal arteries are without ropiness or tenderness. CV:  RRR Lungs:  CTAB Neck/HEME:  There are no carotid bruits bilaterally.  Neurological examination:  Orientation: The patient is alert and oriented x3.  Cranial nerves: There is good facial symmetry. Extraocular muscles are intact. The visual fields are full to confrontational testing. The speech is fluent and clear. Soft palate rises symmetrically and there is no tongue deviation. Hearing is decreased to conversational tone. Sensation: Sensation is intact to light and pinprick throughout (facial, trunk, extremities). Vibration is intact at the bilateral ankle. There is no extinction with  double simultaneous stimulation. There is no sensory dermatomal level identified. Motor: Strength is 5/5 in the bilateral upper and left lower extremities.  Pt has pain in the RLE and reports he cannot lift antigravity, either proximally or distally.  When attempting to test hip flexion on the right in the seated position, he reports pain and there is no m contraction felt in the hip flexors.  When attempting to test knee extension on the right, strength is 3-/5 and there is giveway weakness here. Deep tendon reflexes: Deep tendon reflexes are 2/4 at the bilateral biceps, triceps, brachioradialis, and left patella.  He declines testing at the right patella  Movement examination: Tone: There is normal tone in the bilateral upper extremities.  The tone in the lower extremities is normal.  Abnormal movements: No rest tremor.  There is no postural tremor.  There is intention tremor on the right. Coordination:  There is no decremation with RAM's, with any form of RAMS, including alternating supination and pronation of the forearm, hand opening and closing, finger taps and toe taps bilaterally.  He does not do heel taps because of hip pain on the right. Gait and Station: The patient is able to push himself out of the chair without the examiner's assistance.  He is given his walker.  His gait is stiff and antalgic and somewhat slow, but he does not shuffle.   I have reviewed and interpreted the following labs independently   Chemistry      Component Value Date/Time   NA 143 01/12/2024 0955   NA 145 (H) 03/17/2023 1542   K 3.9 01/12/2024 0955   CL 110 01/12/2024 0955   CO2 27 01/12/2024 0955   BUN 19 01/12/2024 0955   BUN 18 03/17/2023 1542   CREATININE 1.38 01/12/2024 0955   CREATININE 1.67 (H) 01/25/2021 1125   GLU 101 07/11/2021 0000      Component Value Date/Time   CALCIUM  8.5 01/12/2024 0955   ALKPHOS 64 01/12/2024 0955   AST 21 01/12/2024 0955   ALT 21 01/12/2024 0955   BILITOT 0.8  01/12/2024 0955   BILITOT 0.4  06/05/2021 0935      Lab Results  Component Value Date   TSH 0.440 01/05/2024   Lab Results  Component Value Date   WBC 6.9 01/12/2024   HGB 14.9 01/12/2024   HCT 45.7 01/12/2024   MCV 88.1 01/12/2024   PLT 225.0 01/12/2024     Total time spent on today's visit was 140 minutes, including both face-to-face time and nonface-to-face time.  Time included that spent on review of records (prior notes available to me/labs/imaging if pertinent), discussing treatment and goals, answering patient's questions and coordinating care.  Care coordination was prolonged as wife had to leave visit and pt needed significant help checking out, getting to the lab and staying with the patient at the lab and taking the patient to the lobby where he needed assistance with valet/getting transportation home  Cc:  Katrinka Garnette KIDD, MD

## 2024-10-27 ENCOUNTER — Ambulatory Visit (INDEPENDENT_AMBULATORY_CARE_PROVIDER_SITE_OTHER): Admitting: Neurology

## 2024-10-27 ENCOUNTER — Other Ambulatory Visit

## 2024-10-27 ENCOUNTER — Encounter: Payer: Self-pay | Admitting: Neurology

## 2024-10-27 VITALS — BP 142/84 | HR 62 | Ht 72.0 in | Wt 236.0 lb

## 2024-10-27 DIAGNOSIS — Q742 Other congenital malformations of lower limb(s), including pelvic girdle: Secondary | ICD-10-CM

## 2024-10-27 DIAGNOSIS — R251 Tremor, unspecified: Secondary | ICD-10-CM

## 2024-10-27 DIAGNOSIS — H532 Diplopia: Secondary | ICD-10-CM | POA: Diagnosis not present

## 2024-10-27 DIAGNOSIS — I509 Heart failure, unspecified: Secondary | ICD-10-CM | POA: Diagnosis not present

## 2024-10-27 DIAGNOSIS — Z86718 Personal history of other venous thrombosis and embolism: Secondary | ICD-10-CM | POA: Diagnosis not present

## 2024-10-27 DIAGNOSIS — R55 Syncope and collapse: Secondary | ICD-10-CM | POA: Diagnosis not present

## 2024-10-27 DIAGNOSIS — I110 Hypertensive heart disease with heart failure: Secondary | ICD-10-CM | POA: Diagnosis not present

## 2024-10-27 DIAGNOSIS — G7000 Myasthenia gravis without (acute) exacerbation: Secondary | ICD-10-CM

## 2024-10-27 DIAGNOSIS — Z7901 Long term (current) use of anticoagulants: Secondary | ICD-10-CM | POA: Diagnosis not present

## 2024-10-27 DIAGNOSIS — N289 Disorder of kidney and ureter, unspecified: Secondary | ICD-10-CM | POA: Diagnosis not present

## 2024-10-27 DIAGNOSIS — M25551 Pain in right hip: Secondary | ICD-10-CM | POA: Diagnosis not present

## 2024-10-27 DIAGNOSIS — S72411D Displaced unspecified condyle fracture of lower end of right femur, subsequent encounter for closed fracture with routine healing: Secondary | ICD-10-CM | POA: Diagnosis not present

## 2024-10-27 DIAGNOSIS — Z9181 History of falling: Secondary | ICD-10-CM | POA: Diagnosis not present

## 2024-10-27 DIAGNOSIS — K219 Gastro-esophageal reflux disease without esophagitis: Secondary | ICD-10-CM | POA: Diagnosis not present

## 2024-10-29 DIAGNOSIS — Z9181 History of falling: Secondary | ICD-10-CM | POA: Diagnosis not present

## 2024-10-29 DIAGNOSIS — I509 Heart failure, unspecified: Secondary | ICD-10-CM | POA: Diagnosis not present

## 2024-10-29 DIAGNOSIS — S72411D Displaced unspecified condyle fracture of lower end of right femur, subsequent encounter for closed fracture with routine healing: Secondary | ICD-10-CM | POA: Diagnosis not present

## 2024-10-29 DIAGNOSIS — K219 Gastro-esophageal reflux disease without esophagitis: Secondary | ICD-10-CM | POA: Diagnosis not present

## 2024-10-29 DIAGNOSIS — I110 Hypertensive heart disease with heart failure: Secondary | ICD-10-CM | POA: Diagnosis not present

## 2024-10-29 DIAGNOSIS — Z7901 Long term (current) use of anticoagulants: Secondary | ICD-10-CM | POA: Diagnosis not present

## 2024-10-29 DIAGNOSIS — Z86718 Personal history of other venous thrombosis and embolism: Secondary | ICD-10-CM | POA: Diagnosis not present

## 2024-10-29 DIAGNOSIS — N289 Disorder of kidney and ureter, unspecified: Secondary | ICD-10-CM | POA: Diagnosis not present

## 2024-10-30 ENCOUNTER — Ambulatory Visit
Admission: RE | Admit: 2024-10-30 | Discharge: 2024-10-30 | Disposition: A | Discharge status: Home / Self Care | Source: Ambulatory Visit | Attending: Family Medicine | Admitting: Family Medicine

## 2024-10-30 ENCOUNTER — Ambulatory Visit: Payer: Self-pay | Admitting: Family Medicine

## 2024-10-30 DIAGNOSIS — K7689 Other specified diseases of liver: Secondary | ICD-10-CM | POA: Diagnosis not present

## 2024-10-30 DIAGNOSIS — R160 Hepatomegaly, not elsewhere classified: Secondary | ICD-10-CM

## 2024-10-30 DIAGNOSIS — K769 Liver disease, unspecified: Secondary | ICD-10-CM

## 2024-10-30 MED ORDER — GADOPICLENOL 0.5 MMOL/ML IV SOLN
10.0000 mL | Freq: Once | INTRAVENOUS | Status: AC | PRN
  Administered 2024-10-30: 10 mL via INTRAVENOUS

## 2024-11-01 ENCOUNTER — Telehealth: Payer: Self-pay | Admitting: Family Medicine

## 2024-11-01 DIAGNOSIS — K219 Gastro-esophageal reflux disease without esophagitis: Secondary | ICD-10-CM | POA: Diagnosis not present

## 2024-11-01 DIAGNOSIS — Z7901 Long term (current) use of anticoagulants: Secondary | ICD-10-CM | POA: Diagnosis not present

## 2024-11-01 DIAGNOSIS — Z9181 History of falling: Secondary | ICD-10-CM | POA: Diagnosis not present

## 2024-11-01 DIAGNOSIS — Z86718 Personal history of other venous thrombosis and embolism: Secondary | ICD-10-CM | POA: Diagnosis not present

## 2024-11-01 DIAGNOSIS — N289 Disorder of kidney and ureter, unspecified: Secondary | ICD-10-CM | POA: Diagnosis not present

## 2024-11-01 DIAGNOSIS — I509 Heart failure, unspecified: Secondary | ICD-10-CM | POA: Diagnosis not present

## 2024-11-01 DIAGNOSIS — I110 Hypertensive heart disease with heart failure: Secondary | ICD-10-CM | POA: Diagnosis not present

## 2024-11-01 DIAGNOSIS — S72411D Displaced unspecified condyle fracture of lower end of right femur, subsequent encounter for closed fracture with routine healing: Secondary | ICD-10-CM | POA: Diagnosis not present

## 2024-11-01 NOTE — Telephone Encounter (Signed)
 Per result notes looks like you are already aware of this but just sending for clarity.    Copied from CRM [id number]. Topic: General - Other >> Nov 01, 2024  8:36 AM Roselie BROCKS wrote: Reason for CRM: [facility] called with a addendum update on patient.  MRI liver 2 millimeters cystic focus inferior aspect pancreatic body,follow up MRI abdomen -MRCP can be considered in 2 years

## 2024-11-01 NOTE — Telephone Encounter (Signed)
 Yes thanks-already aware-thanks for letting me know

## 2024-11-04 DIAGNOSIS — Z7901 Long term (current) use of anticoagulants: Secondary | ICD-10-CM | POA: Diagnosis not present

## 2024-11-04 DIAGNOSIS — K219 Gastro-esophageal reflux disease without esophagitis: Secondary | ICD-10-CM | POA: Diagnosis not present

## 2024-11-04 DIAGNOSIS — S72411D Displaced unspecified condyle fracture of lower end of right femur, subsequent encounter for closed fracture with routine healing: Secondary | ICD-10-CM | POA: Diagnosis not present

## 2024-11-04 DIAGNOSIS — Z9181 History of falling: Secondary | ICD-10-CM | POA: Diagnosis not present

## 2024-11-04 DIAGNOSIS — I509 Heart failure, unspecified: Secondary | ICD-10-CM | POA: Diagnosis not present

## 2024-11-04 DIAGNOSIS — I110 Hypertensive heart disease with heart failure: Secondary | ICD-10-CM | POA: Diagnosis not present

## 2024-11-04 DIAGNOSIS — Z86718 Personal history of other venous thrombosis and embolism: Secondary | ICD-10-CM | POA: Diagnosis not present

## 2024-11-04 DIAGNOSIS — N289 Disorder of kidney and ureter, unspecified: Secondary | ICD-10-CM | POA: Diagnosis not present

## 2024-11-04 LAB — MYASTHENIA GRAVIS PANEL 1
A CHR BINDING ABS: <0.3 nmol/L
STRIATED MUSCLE AB SCREEN: NEGATIVE

## 2024-11-05 ENCOUNTER — Ambulatory Visit: Payer: Self-pay | Admitting: Neurology

## 2024-11-08 DIAGNOSIS — Z7901 Long term (current) use of anticoagulants: Secondary | ICD-10-CM | POA: Diagnosis not present

## 2024-11-08 DIAGNOSIS — Z9181 History of falling: Secondary | ICD-10-CM | POA: Diagnosis not present

## 2024-11-08 DIAGNOSIS — I509 Heart failure, unspecified: Secondary | ICD-10-CM | POA: Diagnosis not present

## 2024-11-08 DIAGNOSIS — I110 Hypertensive heart disease with heart failure: Secondary | ICD-10-CM | POA: Diagnosis not present

## 2024-11-08 DIAGNOSIS — K219 Gastro-esophageal reflux disease without esophagitis: Secondary | ICD-10-CM | POA: Diagnosis not present

## 2024-11-08 DIAGNOSIS — S72411D Displaced unspecified condyle fracture of lower end of right femur, subsequent encounter for closed fracture with routine healing: Secondary | ICD-10-CM | POA: Diagnosis not present

## 2024-11-08 DIAGNOSIS — Z86718 Personal history of other venous thrombosis and embolism: Secondary | ICD-10-CM | POA: Diagnosis not present

## 2024-11-08 DIAGNOSIS — N289 Disorder of kidney and ureter, unspecified: Secondary | ICD-10-CM | POA: Diagnosis not present

## 2024-11-18 DIAGNOSIS — Z7901 Long term (current) use of anticoagulants: Secondary | ICD-10-CM | POA: Diagnosis not present

## 2024-11-18 DIAGNOSIS — S72411D Displaced unspecified condyle fracture of lower end of right femur, subsequent encounter for closed fracture with routine healing: Secondary | ICD-10-CM | POA: Diagnosis not present

## 2024-11-18 DIAGNOSIS — I110 Hypertensive heart disease with heart failure: Secondary | ICD-10-CM | POA: Diagnosis not present

## 2024-11-18 DIAGNOSIS — Z9181 History of falling: Secondary | ICD-10-CM | POA: Diagnosis not present

## 2024-11-18 DIAGNOSIS — N289 Disorder of kidney and ureter, unspecified: Secondary | ICD-10-CM | POA: Diagnosis not present

## 2024-11-18 DIAGNOSIS — I509 Heart failure, unspecified: Secondary | ICD-10-CM | POA: Diagnosis not present

## 2024-11-18 DIAGNOSIS — Z86718 Personal history of other venous thrombosis and embolism: Secondary | ICD-10-CM | POA: Diagnosis not present

## 2024-11-18 DIAGNOSIS — K219 Gastro-esophageal reflux disease without esophagitis: Secondary | ICD-10-CM | POA: Diagnosis not present

## 2024-11-22 DIAGNOSIS — F4542 Pain disorder with related psychological factors: Secondary | ICD-10-CM | POA: Diagnosis not present

## 2024-12-13 DIAGNOSIS — G8929 Other chronic pain: Secondary | ICD-10-CM | POA: Diagnosis not present

## 2024-12-13 DIAGNOSIS — M12851 Other specific arthropathies, not elsewhere classified, right hip: Secondary | ICD-10-CM | POA: Diagnosis not present

## 2024-12-13 DIAGNOSIS — M25551 Pain in right hip: Secondary | ICD-10-CM | POA: Diagnosis not present

## 2024-12-17 ENCOUNTER — Other Ambulatory Visit: Payer: Self-pay | Admitting: Family Medicine

## 2025-01-04 ENCOUNTER — Encounter: Payer: Self-pay | Admitting: Family Medicine

## 2025-01-05 ENCOUNTER — Telehealth: Admitting: Emergency Medicine

## 2025-01-05 DIAGNOSIS — J019 Acute sinusitis, unspecified: Secondary | ICD-10-CM | POA: Diagnosis not present

## 2025-01-05 DIAGNOSIS — B9689 Other specified bacterial agents as the cause of diseases classified elsewhere: Secondary | ICD-10-CM | POA: Diagnosis not present

## 2025-01-05 DIAGNOSIS — R0989 Other specified symptoms and signs involving the circulatory and respiratory systems: Secondary | ICD-10-CM | POA: Diagnosis not present

## 2025-01-05 MED ORDER — AMOXICILLIN-POT CLAVULANATE 875-125 MG PO TABS: 1.0000 | ORAL_TABLET | Freq: Two times a day (BID) | ORAL | 0 refills | Status: AC

## 2025-01-05 MED ORDER — BENZONATATE 100 MG PO CAPS: 100.0000 mg | ORAL_CAPSULE | Freq: Two times a day (BID) | ORAL | 0 refills | Status: AC | PRN

## 2025-01-05 MED ORDER — AZITHROMYCIN 250 MG PO TABS: ORAL_TABLET | ORAL | 0 refills | Status: AC

## 2025-01-05 NOTE — Progress Notes (Signed)
 " Virtual Visit Consent   [name], you are scheduled for a virtual visit with a Bruce Ball provider today. Just as with appointments in the office, your consent must be obtained to participate. Your consent will be active for this visit and any virtual visit you may have with one of our providers in the next 365 days. If you have a MyChart account, a copy of this consent can be sent to you electronically.  As this is a virtual visit, video technology does not allow for your provider to perform a traditional examination. This may limit your provider's ability to fully assess your condition. If your provider identifies any concerns that need to be evaluated in person or the need to arrange testing (such as labs, EKG, etc.), we will make arrangements to do so. Although advances in technology are sophisticated, we cannot ensure that it will always work on either your end or our end. If the connection with a video visit is poor, the visit may have to be switched to a telephone visit. With either a video or telephone visit, we are not always able to ensure that we have a secure connection.  By engaging in this virtual visit, you consent to the provision of healthcare and authorize for your insurance to be billed (if applicable) for the services provided during this visit. Depending on your insurance coverage, you may receive a charge related to this service.  I need to obtain your verbal consent now. Are you willing to proceed with your visit today? [name] has provided verbal consent on 01/05/2025 for a virtual visit (video or telephone). [name], NP  Date: 01/05/2025 3:06 PM   Virtual Visit via Video Note   I, [name], connected with  [name]  ([id number], 02-07-1946) on 01/05/2025 at  3:00 PM EST by a video-enabled telemedicine application and verified that I am speaking with the correct person using two identifiers.  Location: Patient: Virtual Visit Location Patient:  Home Provider: Virtual Visit Location Provider: Home Office   I discussed the limitations of evaluation and management by telemedicine and the availability of in person appointments. The patient expressed understanding and agreed to proceed.    History of Present Illness: Bruce Ball is a 79 y.o. who identifies as a male who was assigned male at birth, and is being seen today for a cold for about a week. No fever. Head congestion.   Now has Sinus pressure L frontal and max sinuses. Yellow nasal discharge from left nare. Bad cough is productive, he doesn't know what sputum looks like. Has been using saline rinse. Hoarse voice is getting better. Does feel a little short of breath when gets to coughing. Does not think he needs an inhaler - used one about 20 years ago, has not needed one since  BP 141/74 HR 65  HPI: HPI  Problems:  Patient Active Problem List   Diagnosis Date Noted   Fall 01/05/2024   UTI due to Klebsiella species 11/11/2022   Basal cell carcinoma of scalp 09/16/2022   Deposits (accretions) on teeth 09/16/2022   Nontoxic single thyroid  nodule 09/16/2022   Thyroid  nodule 09/16/2022   Sensorineural hearing loss, bilateral 09/16/2022   Post-COVID syndrome 09/16/2022   Chronic pansinusitis 07/11/2022   Non-seasonal allergic rhinitis 06/11/2022   Abrasion of right arm 11/19/2021   Femur fracture, right (HCC) 10/17/2021   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HCC) 10/16/2021   History of pulmonary embolism 10/10/2021   Chronic diastolic  CHF (congestive heart failure) (HCC) 10/10/2021   CKD (chronic kidney disease), stage III (HCC) 10/10/2021   Hyperlipidemia 10/10/2021   CAD (coronary artery disease) 04/03/2021   Chronic anticoagulation 03/26/2021   Acquired coagulation disorder 12/12/2020   Senile purpura 12/12/2020   Meibomian gland dysfunction of right eye, unspecified eyelid 07/11/2020   Vitamin B12 deficient megaloblastic anemia 07/11/2020   Aortic atherosclerosis 05/17/2020    B12 deficiency 05/17/2020   Iliac artery aneurysm 05/17/2020   Osteoporosis 04/04/2020   Idiopathic neuropathy 01/28/2020   Recurrent falls 01/28/2020   GERD (gastroesophageal reflux disease) 12/22/2019   Essential tremor 12/22/2019   Kidney stone 11/05/2017   History of total right hip replacement 03/08/2017   History of prostate cancer 03/08/2017   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  Resting comfortably  at home.  Head is normocephalic, atraumatic.  No labored breathing. Coughing spasms Speech is clear and coherent with logical content.  Patient is alert and oriented at baseline.    Assessment and Plan: 1. Acute bacterial sinusitis (Primary)  2. Chest congestion  Sx seem like he has a sinus infection, cough may be sign of CAP as well. I have double covered him with zpak and augmentin .   Follow Up Instructions: I discussed the assessment and treatment plan with the patient. The patient was provided an opportunity to ask questions and all were answered. The patient agreed with the plan and demonstrated an understanding of the instructions.  A copy of instructions were sent to the patient via MyChart unless otherwise noted below.   The patient was advised to call back or seek an in-person evaluation if the symptoms worsen or if the condition fails to improve as anticipated.    [name], NP    [1]   Allergies Allergen Reactions   Tape Other (See Comments)    NEEDS COBAN WRAP- NO TAPE!!!!- SKIN TEARS VERY EASILY!!!!   Erythromycin Nausea And Vomiting and Other (See Comments)   Meclizine  Nausea And Vomiting   Lisinopril Other (See Comments)    Lowered the blood pressure TOO MUCH!!  All ACE Inhibitors per wife at bedside  Reaction unknown; says lowered blood pressure too much    Reaction unknown; says lowered blood pressure too much  Lowered the blood pressure TOO MUCH!!  Reaction unknown; says lowered blood pressure too much  Lowered the blood pressure TOO MUCH!!    Lowered the blood pressure TOO MUCH!! All ACE Inhibitors per wife at bedside    Reaction unknown; says lowered blood pressure too much  Lowered the blood pressure TOO MUCH!!  [2]  Current Outpatient Medications:    amoxicillin -clavulanate (AUGMENTIN ) 875-125 MG tablet, Take 1 tablet by mouth 2 (two) times daily., Disp: 14 tablet, Rfl: 0   azithromycin  (ZITHROMAX ) 250 MG tablet, Take 2 tablets on day 1, then 1 tablet daily on days 2 through 5, Disp: 6 tablet, Rfl: 0   benzonatate  (TESSALON ) 100 MG capsule, Take 1 capsule (100 mg total) by mouth 2 (two) times daily as needed  for cough., Disp: 20 capsule, Rfl: 0   acetaminophen  (TYLENOL ) 500 MG tablet, Take 2 tablets (1,000 mg total) by mouth every 6 (six) hours as needed for headache, fever, moderate pain (pain score 4-6) or mild pain (pain score 1-3)., Disp: 30 tablet, Rfl: 0   amLODipine  (NORVASC ) 10 MG tablet, Take 1 tablet (10 mg total) by mouth daily., Disp: 90 tablet, Rfl: 3   apixaban  (ELIQUIS ) 2.5 MG TABS tablet, Take 1 tablet (2.5 mg total) by mouth 2 (two) times daily., Disp: , Rfl:    atorvastatin  (LIPITOR) 20 MG tablet, Take 1 tablet (20 mg total) by mouth at bedtime., Disp: 90 tablet, Rfl: 3   carvedilol  (COREG ) 25 MG tablet, Take 25 mg by mouth 2 (two) times daily with a meal., Disp: , Rfl:    cetirizine (ZYRTEC) 10 MG tablet, Take 10 mg by mouth as needed for  allergies., Disp: , Rfl:    Cholecalciferol  50 MCG (2000 UT) TABS, Take by mouth., Disp: , Rfl:    DULoxetine  (CYMBALTA ) 60 MG capsule, TAKE 1 CAPSULE BY MOUTH EVERY DAY, Disp: 90 capsule, Rfl: 2   fluticasone  (FLONASE ) 50 MCG/ACT nasal spray, Place 1-2 sprays into both nostrils daily as needed for allergies or rhinitis., Disp: , Rfl:    furosemide  (LASIX ) 20 MG tablet, Take 1 tablet (20 mg total) by mouth daily as needed for fluid (if the feet and/or legs swell or weight increased 3 lbs in a day or 5 lbs in 3 days)., Disp: 30 tablet, Rfl: 5   Multiple Vitamin (MULTIVITAMIN) capsule, Take 1 capsule by mouth daily., Disp: , Rfl:    naloxone (NARCAN) nasal spray 4 mg/0.1 mL, PRN, Disp: , Rfl:    pantoprazole  (PROTONIX ) 40 MG tablet, Take 40 mg by mouth 2 (two) times daily before a meal., Disp: , Rfl:    potassium citrate  (UROCIT-K ) 10 MEQ (1080 MG) SR tablet, Take 1 tablet (10 mEq total) by mouth as directed., Disp: 90 tablet, Rfl: 1  "

## 2025-01-05 NOTE — Patient Instructions (Signed)
 " Bruce Ball, thank you for joining Bruce CHRISTELLA Belt, Bruce Ball for today's virtual visit.  While this provider is not your primary care provider (PCP), if your PCP is located in our provider database this encounter information will be shared with them immediately following your visit.   A [facility] MyChart account gives you access to today's visit and all your visits, tests, and labs performed at [facility]  click here if you don't have a [facility] MyChart account or go to [email or web address]  Consent: (Patient) Bruce Ball provided verbal consent for this virtual visit at the beginning of the encounter.  Current Medications:  Current Outpatient Medications:    amoxicillin -clavulanate (AUGMENTIN ) 875-125 MG tablet, Take 1 tablet by mouth 2 (two) times daily., Disp: 14 tablet, Rfl: 0   azithromycin  (ZITHROMAX ) 250 MG tablet, Take 2 tablets on day 1, then 1 tablet daily on days 2 through 5, Disp: 6 tablet, Rfl: 0   benzonatate  (TESSALON ) 100 MG capsule, Take 1 capsule (100 mg total) by mouth 2 (two) times daily as needed for cough., Disp: 20 capsule, Rfl: 0   acetaminophen  (TYLENOL ) 500 MG tablet, Take 2 tablets (1,000 mg total) by mouth every 6 (six) hours as needed for headache, fever, moderate pain (pain score 4-6) or mild pain (pain score 1-3)., Disp: 30 tablet, Rfl: 0   amLODipine  (NORVASC ) 10 MG tablet, Take 1 tablet (10 mg total) by mouth daily., Disp: 90 tablet, Rfl: 3   apixaban  (ELIQUIS ) 2.5 MG TABS tablet, Take 1 tablet (2.5 mg total) by mouth 2 (two) times daily., Disp: , Rfl:    atorvastatin  (LIPITOR) 20 MG tablet, Take 1 tablet (20 mg total) by mouth at bedtime., Disp: 90 tablet, Rfl: 3   carvedilol  (COREG ) 25 MG tablet, Take 25 mg by mouth 2 (two) times daily with a meal., Disp: , Rfl:    cetirizine (ZYRTEC) 10 MG tablet, Take 10 mg by mouth as needed for allergies., Disp: , Rfl:    Cholecalciferol  50 MCG (2000 UT) TABS, Take by mouth., Disp: , Rfl:     DULoxetine  (CYMBALTA ) 60 MG capsule, TAKE 1 CAPSULE BY MOUTH EVERY DAY, Disp: 90 capsule, Rfl: 2   fluticasone  (FLONASE ) 50 MCG/ACT nasal spray, Place 1-2 sprays into both nostrils daily as needed for allergies or rhinitis., Disp: , Rfl:    furosemide  (LASIX ) 20 MG tablet, Take 1 tablet (20 mg total) by mouth daily as needed for fluid (if the feet and/or legs swell or weight increased 3 lbs in a day or 5 lbs in 3 days)., Disp: 30 tablet, Rfl: 5   Multiple Vitamin (MULTIVITAMIN) capsule, Take 1 capsule by mouth daily., Disp: , Rfl:    naloxone (NARCAN) nasal spray 4 mg/0.1 mL, PRN, Disp: , Rfl:    pantoprazole  (PROTONIX ) 40 MG tablet, Take 40 mg by mouth 2 (two) times daily before a meal., Disp: , Rfl:    potassium citrate  (UROCIT-K ) 10 MEQ (1080 MG) SR tablet, Take 1 tablet (10 mEq total) by mouth as directed., Disp: 90 tablet, Rfl: 1   Medications ordered in this encounter:  Meds ordered this encounter  Medications   amoxicillin -clavulanate (AUGMENTIN ) 875-125 MG tablet    Sig: Take 1 tablet by mouth 2 (two) times daily.    Dispense:  14 tablet    Refill:  0   azithromycin  (ZITHROMAX ) 250 MG tablet    Sig: Take 2 tablets on day 1, then 1 tablet daily on days 2 through 5  Dispense:  6 tablet    Refill:  0   benzonatate  (TESSALON ) 100 MG capsule    Sig: Take 1 capsule (100 mg total) by mouth 2 (two) times daily as needed for cough.    Dispense:  20 capsule    Refill:  0     *If you need refills on other medications prior to your next appointment, please contact your pharmacy*  Follow-Up: Call back or seek an in-person evaluation if the symptoms worsen or if the condition fails to improve as anticipated.  [facility] [phone]  Other Instructions  Please follow up in person, either with your primary care provider or at an urgent care, if you are not feeling much better within 48 hours. If you have worsening shortness of breath, please be seen in person and if it is  severe, call [phone]   If you have been instructed to have an in-person evaluation today at a local Urgent Care facility, please use the link below. It will take you to a list of all of our available [facility], including address, phone number and hours of operation. Please do not delay care.  [facility]  If you or a family member do not have a primary care provider, use the link below to schedule a visit and establish care. When you choose a [facility] primary care physician or advanced practice provider, you gain a long-term partner in health. Find a Primary Care Provider  Learn more about [facility] in-office and virtual care options: [facility] - Get Care Now  "

## 2025-01-10 ENCOUNTER — Other Ambulatory Visit: Payer: Self-pay | Admitting: Family Medicine

## 2025-01-10 DIAGNOSIS — R2689 Other abnormalities of gait and mobility: Secondary | ICD-10-CM

## 2025-01-10 DIAGNOSIS — R531 Weakness: Secondary | ICD-10-CM

## 2025-01-10 DIAGNOSIS — R296 Repeated falls: Secondary | ICD-10-CM

## 2025-01-10 NOTE — Telephone Encounter (Signed)
 Referral sent to [facility].

## 2025-01-31 ENCOUNTER — Ambulatory Visit: Admitting: Physical Therapy

## 2025-02-09 ENCOUNTER — Ambulatory Visit: Admitting: Physical Therapy

## 2025-02-22 ENCOUNTER — Ambulatory Visit: Payer: Medicare Other
